# Patient Record
Sex: Female | Born: 1945 | Race: Black or African American | Hispanic: No | Marital: Married | Smoking: Former smoker
Health system: Southern US, Community
[De-identification: ages and names within clinical notes are randomized; demographics above are authoritative.]

## PROBLEM LIST (undated history)

## (undated) DIAGNOSIS — M545 Low back pain, unspecified: Secondary | ICD-10-CM

## (undated) DIAGNOSIS — Z9889 Other specified postprocedural states: Secondary | ICD-10-CM

## (undated) DIAGNOSIS — J45909 Unspecified asthma, uncomplicated: Secondary | ICD-10-CM

## (undated) DIAGNOSIS — E559 Vitamin D deficiency, unspecified: Secondary | ICD-10-CM

## (undated) DIAGNOSIS — E119 Type 2 diabetes mellitus without complications: Secondary | ICD-10-CM

## (undated) DIAGNOSIS — R51 Headache: Secondary | ICD-10-CM

## (undated) DIAGNOSIS — Z862 Personal history of diseases of the blood and blood-forming organs and certain disorders involving the immune mechanism: Secondary | ICD-10-CM

## (undated) DIAGNOSIS — I700 Atherosclerosis of aorta: Secondary | ICD-10-CM

## (undated) DIAGNOSIS — R351 Nocturia: Secondary | ICD-10-CM

## (undated) DIAGNOSIS — Z972 Presence of dental prosthetic device (complete) (partial): Secondary | ICD-10-CM

## (undated) DIAGNOSIS — R0600 Dyspnea, unspecified: Secondary | ICD-10-CM

## (undated) DIAGNOSIS — K219 Gastro-esophageal reflux disease without esophagitis: Secondary | ICD-10-CM

## (undated) DIAGNOSIS — E785 Hyperlipidemia, unspecified: Secondary | ICD-10-CM

## (undated) DIAGNOSIS — J302 Other seasonal allergic rhinitis: Secondary | ICD-10-CM

## (undated) DIAGNOSIS — Z8619 Personal history of other infectious and parasitic diseases: Secondary | ICD-10-CM

## (undated) DIAGNOSIS — Z9689 Presence of other specified functional implants: Secondary | ICD-10-CM

## (undated) DIAGNOSIS — Z8669 Personal history of other diseases of the nervous system and sense organs: Secondary | ICD-10-CM

## (undated) DIAGNOSIS — Z860100 Personal history of colon polyps, unspecified: Secondary | ICD-10-CM

## (undated) DIAGNOSIS — N189 Chronic kidney disease, unspecified: Secondary | ICD-10-CM

## (undated) DIAGNOSIS — M1990 Unspecified osteoarthritis, unspecified site: Secondary | ICD-10-CM

## (undated) DIAGNOSIS — H269 Unspecified cataract: Secondary | ICD-10-CM

## (undated) DIAGNOSIS — R531 Weakness: Secondary | ICD-10-CM

## (undated) DIAGNOSIS — F419 Anxiety disorder, unspecified: Secondary | ICD-10-CM

## (undated) DIAGNOSIS — I10 Essential (primary) hypertension: Secondary | ICD-10-CM

## (undated) DIAGNOSIS — R519 Headache, unspecified: Secondary | ICD-10-CM

## (undated) HISTORY — DX: Essential (primary) hypertension: I10

## (undated) HISTORY — PX: REVISION OF SCAR TISSUE RECTUS MUSCLE: SHX2351

## (undated) HISTORY — PX: BACK SURGERY: SHX140

## (undated) HISTORY — PX: OTHER SURGICAL HISTORY: SHX169

## (undated) HISTORY — PX: ABDOMINAL HYSTERECTOMY: SHX81

## (undated) HISTORY — DX: Type 2 diabetes mellitus without complications: E11.9

## (undated) HISTORY — PX: TOTAL KNEE ARTHROPLASTY: SHX125

## (undated) HISTORY — DX: Atherosclerosis of aorta: I70.0

## (undated) HISTORY — DX: Unspecified asthma, uncomplicated: J45.909

## (undated) HISTORY — PX: APPENDECTOMY: SHX54

## (undated) HISTORY — PX: COLONOSCOPY: SHX174

## (undated) HISTORY — PX: JOINT REPLACEMENT: SHX530

## (undated) HISTORY — PX: SMALL BOWEL REPAIR: SHX6447

## (undated) HISTORY — PX: ESOPHAGOGASTRODUODENOSCOPY: SHX1529

---

## 1968-08-22 HISTORY — PX: BREAST EXCISIONAL BIOPSY: SUR124

## 1976-08-22 HISTORY — PX: AUGMENTATION MAMMAPLASTY: SUR837

## 1988-08-22 HISTORY — PX: BREAST SURGERY: SHX581

## 1996-08-22 HISTORY — PX: KIDNEY SURGERY: SHX687

## 2000-06-28 ENCOUNTER — Encounter: Payer: Self-pay | Admitting: Neurosurgery

## 2000-06-28 ENCOUNTER — Ambulatory Visit (HOSPITAL_COMMUNITY): Admission: RE | Admit: 2000-06-28 | Discharge: 2000-06-28 | Payer: Self-pay | Admitting: Neurosurgery

## 2001-08-31 ENCOUNTER — Encounter: Payer: Self-pay | Admitting: Neurosurgery

## 2001-08-31 ENCOUNTER — Encounter: Admission: RE | Admit: 2001-08-31 | Discharge: 2001-08-31 | Payer: Self-pay | Admitting: Neurosurgery

## 2001-09-26 ENCOUNTER — Encounter: Payer: Self-pay | Admitting: Neurosurgery

## 2001-09-26 ENCOUNTER — Inpatient Hospital Stay (HOSPITAL_COMMUNITY): Admission: RE | Admit: 2001-09-26 | Discharge: 2001-09-28 | Payer: Self-pay | Admitting: Neurosurgery

## 2002-01-09 ENCOUNTER — Encounter: Payer: Self-pay | Admitting: Neurosurgery

## 2002-01-09 ENCOUNTER — Encounter: Admission: RE | Admit: 2002-01-09 | Discharge: 2002-01-09 | Payer: Self-pay | Admitting: Neurosurgery

## 2002-09-04 ENCOUNTER — Emergency Department (HOSPITAL_COMMUNITY): Admission: EM | Admit: 2002-09-04 | Discharge: 2002-09-04 | Payer: Self-pay

## 2003-08-19 ENCOUNTER — Inpatient Hospital Stay (HOSPITAL_COMMUNITY): Admission: RE | Admit: 2003-08-19 | Discharge: 2003-08-23 | Payer: Self-pay | Admitting: Neurosurgery

## 2004-01-06 ENCOUNTER — Encounter: Admission: RE | Admit: 2004-01-06 | Discharge: 2004-01-06 | Payer: Self-pay | Admitting: Neurosurgery

## 2004-03-10 ENCOUNTER — Encounter: Admission: RE | Admit: 2004-03-10 | Discharge: 2004-03-10 | Payer: Self-pay | Admitting: Neurosurgery

## 2004-03-23 ENCOUNTER — Encounter: Admission: RE | Admit: 2004-03-23 | Discharge: 2004-03-23 | Payer: Self-pay | Admitting: Neurosurgery

## 2004-07-29 ENCOUNTER — Ambulatory Visit: Payer: Self-pay | Admitting: Internal Medicine

## 2004-08-19 ENCOUNTER — Ambulatory Visit: Payer: Self-pay | Admitting: Internal Medicine

## 2004-09-13 ENCOUNTER — Ambulatory Visit: Payer: Self-pay | Admitting: Neurology

## 2004-10-03 ENCOUNTER — Emergency Department: Payer: Self-pay | Admitting: Emergency Medicine

## 2005-03-28 ENCOUNTER — Ambulatory Visit: Payer: Self-pay | Admitting: Pain Medicine

## 2005-05-09 ENCOUNTER — Ambulatory Visit: Payer: Self-pay | Admitting: Pain Medicine

## 2005-05-24 ENCOUNTER — Ambulatory Visit: Payer: Self-pay | Admitting: Internal Medicine

## 2005-05-31 ENCOUNTER — Ambulatory Visit: Payer: Self-pay | Admitting: Pain Medicine

## 2005-06-02 ENCOUNTER — Ambulatory Visit: Payer: Self-pay | Admitting: Pain Medicine

## 2005-06-08 ENCOUNTER — Ambulatory Visit: Payer: Self-pay | Admitting: Pain Medicine

## 2005-09-29 ENCOUNTER — Encounter: Admission: RE | Admit: 2005-09-29 | Discharge: 2005-09-29 | Payer: Self-pay | Admitting: Neurosurgery

## 2005-10-24 ENCOUNTER — Ambulatory Visit: Payer: Self-pay | Admitting: Internal Medicine

## 2005-11-15 ENCOUNTER — Emergency Department: Payer: Self-pay | Admitting: Emergency Medicine

## 2006-01-19 ENCOUNTER — Ambulatory Visit: Payer: Self-pay | Admitting: Specialist

## 2006-01-25 ENCOUNTER — Ambulatory Visit: Payer: Self-pay | Admitting: Unknown Physician Specialty

## 2006-02-10 ENCOUNTER — Ambulatory Visit: Payer: Self-pay | Admitting: Specialist

## 2006-04-26 ENCOUNTER — Ambulatory Visit: Payer: Self-pay | Admitting: Internal Medicine

## 2006-06-05 ENCOUNTER — Ambulatory Visit: Payer: Self-pay | Admitting: Pain Medicine

## 2006-07-10 ENCOUNTER — Ambulatory Visit: Payer: Self-pay | Admitting: Pain Medicine

## 2006-09-12 ENCOUNTER — Ambulatory Visit: Payer: Self-pay | Admitting: Pain Medicine

## 2006-09-18 ENCOUNTER — Ambulatory Visit: Payer: Self-pay | Admitting: Pain Medicine

## 2006-11-06 ENCOUNTER — Ambulatory Visit: Payer: Self-pay | Admitting: Internal Medicine

## 2006-11-07 ENCOUNTER — Ambulatory Visit: Payer: Self-pay | Admitting: Specialist

## 2007-06-06 ENCOUNTER — Ambulatory Visit: Payer: Self-pay | Admitting: Pain Medicine

## 2007-06-21 ENCOUNTER — Emergency Department: Payer: Self-pay | Admitting: Internal Medicine

## 2007-09-04 ENCOUNTER — Encounter: Admission: RE | Admit: 2007-09-04 | Discharge: 2007-09-04 | Payer: Self-pay | Admitting: Orthopedic Surgery

## 2007-09-23 ENCOUNTER — Ambulatory Visit: Payer: Self-pay | Admitting: Emergency Medicine

## 2007-11-26 ENCOUNTER — Ambulatory Visit: Payer: Self-pay | Admitting: Gastroenterology

## 2007-12-11 ENCOUNTER — Ambulatory Visit: Payer: Self-pay | Admitting: Internal Medicine

## 2008-01-10 ENCOUNTER — Observation Stay (HOSPITAL_COMMUNITY): Admission: RE | Admit: 2008-01-10 | Discharge: 2008-01-11 | Payer: Self-pay | Admitting: Orthopedic Surgery

## 2008-04-10 ENCOUNTER — Ambulatory Visit: Payer: Self-pay | Admitting: Internal Medicine

## 2008-06-18 ENCOUNTER — Ambulatory Visit: Payer: Self-pay | Admitting: Internal Medicine

## 2009-01-12 ENCOUNTER — Ambulatory Visit: Payer: Self-pay | Admitting: Gastroenterology

## 2009-02-03 ENCOUNTER — Ambulatory Visit: Payer: Self-pay | Admitting: Family Medicine

## 2009-06-24 ENCOUNTER — Inpatient Hospital Stay: Payer: Self-pay | Admitting: Internal Medicine

## 2009-09-08 ENCOUNTER — Inpatient Hospital Stay (HOSPITAL_COMMUNITY): Admission: RE | Admit: 2009-09-08 | Discharge: 2009-09-11 | Payer: Self-pay | Admitting: Specialist

## 2009-09-11 ENCOUNTER — Encounter: Payer: Self-pay | Admitting: Internal Medicine

## 2009-09-21 ENCOUNTER — Ambulatory Visit: Payer: Self-pay | Admitting: Internal Medicine

## 2009-09-22 ENCOUNTER — Encounter: Payer: Self-pay | Admitting: Internal Medicine

## 2009-09-23 ENCOUNTER — Emergency Department (HOSPITAL_COMMUNITY): Admission: EM | Admit: 2009-09-23 | Discharge: 2009-09-23 | Payer: Self-pay | Admitting: Emergency Medicine

## 2009-10-20 ENCOUNTER — Ambulatory Visit: Payer: Self-pay | Admitting: Family Medicine

## 2010-04-12 ENCOUNTER — Ambulatory Visit: Payer: Self-pay | Admitting: Family Medicine

## 2010-05-18 ENCOUNTER — Ambulatory Visit: Payer: Self-pay | Admitting: Family Medicine

## 2010-09-13 ENCOUNTER — Encounter: Payer: Self-pay | Admitting: Physical Medicine and Rehabilitation

## 2010-09-26 ENCOUNTER — Ambulatory Visit: Payer: Self-pay | Admitting: Family Medicine

## 2010-10-05 ENCOUNTER — Other Ambulatory Visit: Payer: Self-pay | Admitting: Physical Medicine and Rehabilitation

## 2010-10-05 ENCOUNTER — Ambulatory Visit
Admission: RE | Admit: 2010-10-05 | Discharge: 2010-10-05 | Disposition: A | Discharge status: Home / Self Care | Payer: Private Health Insurance - Indemnity | Source: Ambulatory Visit | Attending: Physical Medicine and Rehabilitation | Admitting: Physical Medicine and Rehabilitation

## 2010-10-05 DIAGNOSIS — M25512 Pain in left shoulder: Secondary | ICD-10-CM

## 2010-10-21 HISTORY — PX: CARDIAC CATHETERIZATION: SHX172

## 2010-11-07 LAB — CBC
HCT: 41.9 % (ref 36.0–46.0)
Hemoglobin: 14 g/dL (ref 12.0–15.0)
MCHC: 33.3 g/dL (ref 30.0–36.0)
MCV: 81.3 fL (ref 78.0–100.0)
Platelets: 329 10*3/uL (ref 150–400)
RBC: 5.16 MIL/uL — ABNORMAL HIGH (ref 3.87–5.11)
RDW: 14.2 % (ref 11.5–15.5)
WBC: 6.7 10*3/uL (ref 4.0–10.5)

## 2010-11-07 LAB — COMPREHENSIVE METABOLIC PANEL
ALT: 18 U/L (ref 0–35)
AST: 20 U/L (ref 0–37)
Albumin: 4 g/dL (ref 3.5–5.2)
Alkaline Phosphatase: 104 U/L (ref 39–117)
BUN: 17 mg/dL (ref 6–23)
CO2: 30 mEq/L (ref 19–32)
Calcium: 9.8 mg/dL (ref 8.4–10.5)
Chloride: 102 mEq/L (ref 96–112)
Creatinine, Ser: 0.64 mg/dL (ref 0.4–1.2)
GFR calc Af Amer: >60 mL/min (ref >60)
GFR calc non Af Amer: >60 mL/min (ref >60)
Glucose, Bld: 162 mg/dL — ABNORMAL HIGH (ref 70–99)
Potassium: 3.7 mEq/L (ref 3.5–5.1)
Sodium: 142 mEq/L (ref 135–145)
Total Bilirubin: 0.3 mg/dL (ref 0.3–1.2)
Total Protein: 8 g/dL (ref 6.0–8.3)

## 2010-11-07 LAB — DIFFERENTIAL
Basophils Absolute: 0 10*3/uL (ref 0.0–0.1)
Basophils Relative: 0 % (ref 0–1)
Eosinophils Absolute: 0.1 10*3/uL (ref 0.0–0.7)
Eosinophils Relative: 1 % (ref 0–5)
Lymphocytes Relative: 5 % — ABNORMAL LOW (ref 12–46)
Lymphs Abs: 0.4 10*3/uL — ABNORMAL LOW (ref 0.7–4.0)
Monocytes Absolute: 0.2 10*3/uL (ref 0.1–1.0)
Monocytes Relative: 2 % — ABNORMAL LOW (ref 3–12)
Neutro Abs: 6.1 10*3/uL (ref 1.7–7.7)
Neutrophils Relative %: 91 % — ABNORMAL HIGH (ref 43–77)

## 2010-11-07 LAB — URINALYSIS, ROUTINE W REFLEX MICROSCOPIC
Bilirubin Urine: NEGATIVE
Glucose, UA: NEGATIVE mg/dL
Hgb urine dipstick: NEGATIVE
Ketones, ur: NEGATIVE mg/dL
Nitrite: NEGATIVE
Protein, ur: NEGATIVE mg/dL
Specific Gravity, Urine: 1.015 (ref 1.005–1.030)
Urobilinogen, UA: 1 mg/dL (ref 0.0–1.0)
pH: 6 (ref 5.0–8.0)

## 2010-11-07 LAB — PROTIME-INR
INR: 0.93 (ref 0.00–1.49)
Prothrombin Time: 12.4 seconds (ref 11.6–15.2)

## 2010-11-07 LAB — APTT: aPTT: 32 seconds (ref 24–37)

## 2010-11-08 LAB — BASIC METABOLIC PANEL
BUN: 10 mg/dL (ref 6–23)
BUN: 5 mg/dL — ABNORMAL LOW (ref 6–23)
CO2: 24 mEq/L (ref 19–32)
CO2: 26 mEq/L (ref 19–32)
Calcium: 8.6 mg/dL (ref 8.4–10.5)
Calcium: 8.6 mg/dL (ref 8.4–10.5)
Chloride: 106 mEq/L (ref 96–112)
Chloride: 107 mEq/L (ref 96–112)
Creatinine, Ser: 0.61 mg/dL (ref 0.4–1.2)
Creatinine, Ser: 0.62 mg/dL (ref 0.4–1.2)
GFR calc Af Amer: >60 mL/min (ref >60)
GFR calc Af Amer: >60 mL/min (ref >60)
GFR calc non Af Amer: >60 mL/min (ref >60)
GFR calc non Af Amer: >60 mL/min (ref >60)
Glucose, Bld: 136 mg/dL — ABNORMAL HIGH (ref 70–99)
Glucose, Bld: 152 mg/dL — ABNORMAL HIGH (ref 70–99)
Potassium: 3.4 mEq/L — ABNORMAL LOW (ref 3.5–5.1)
Potassium: 4.2 mEq/L (ref 3.5–5.1)
Sodium: 137 mEq/L (ref 135–145)
Sodium: 138 mEq/L (ref 135–145)

## 2010-11-08 LAB — CROSSMATCH
ABO/RH(D): O POS
Antibody Screen: NEGATIVE

## 2010-11-08 LAB — CBC
HCT: 26.6 % — ABNORMAL LOW (ref 36.0–46.0)
HCT: 27.2 % — ABNORMAL LOW (ref 36.0–46.0)
HCT: 35.2 % — ABNORMAL LOW (ref 36.0–46.0)
Hemoglobin: 11.4 g/dL — ABNORMAL LOW (ref 12.0–15.0)
Hemoglobin: 8.8 g/dL — ABNORMAL LOW (ref 12.0–15.0)
Hemoglobin: 9 g/dL — ABNORMAL LOW (ref 12.0–15.0)
MCHC: 32.4 g/dL (ref 30.0–36.0)
MCHC: 33.2 g/dL (ref 30.0–36.0)
MCHC: 33.3 g/dL (ref 30.0–36.0)
MCV: 81.6 fL (ref 78.0–100.0)
MCV: 82.2 fL (ref 78.0–100.0)
MCV: 82.7 fL (ref 78.0–100.0)
Platelets: 225 10*3/uL (ref 150–400)
Platelets: 258 10*3/uL (ref 150–400)
Platelets: 268 10*3/uL (ref 150–400)
RBC: 3.23 MIL/uL — ABNORMAL LOW (ref 3.87–5.11)
RBC: 3.33 MIL/uL — ABNORMAL LOW (ref 3.87–5.11)
RBC: 4.26 MIL/uL (ref 3.87–5.11)
RDW: 13.7 % (ref 11.5–15.5)
RDW: 14 % (ref 11.5–15.5)
RDW: 14.1 % (ref 11.5–15.5)
WBC: 7.1 10*3/uL (ref 4.0–10.5)
WBC: 7.2 10*3/uL (ref 4.0–10.5)
WBC: 8.5 10*3/uL (ref 4.0–10.5)

## 2010-11-08 LAB — GLUCOSE, CAPILLARY: Glucose-Capillary: 150 mg/dL — ABNORMAL HIGH (ref 70–99)

## 2010-11-08 LAB — ABO/RH: ABO/RH(D): O POS

## 2010-11-09 ENCOUNTER — Observation Stay: Payer: Self-pay | Admitting: *Deleted

## 2010-11-11 LAB — BASIC METABOLIC PANEL
BUN: 16 mg/dL (ref 6–23)
CO2: 25 mEq/L (ref 19–32)
Calcium: 9.6 mg/dL (ref 8.4–10.5)
Chloride: 102 mEq/L (ref 96–112)
Creatinine, Ser: 0.58 mg/dL (ref 0.4–1.2)
GFR calc Af Amer: >60 mL/min (ref >60)
GFR calc non Af Amer: >60 mL/min (ref >60)
Glucose, Bld: 145 mg/dL — ABNORMAL HIGH (ref 70–99)
Potassium: 3.9 mEq/L (ref 3.5–5.1)
Sodium: 136 mEq/L (ref 135–145)

## 2010-11-11 LAB — CBC
HCT: 30.1 % — ABNORMAL LOW (ref 36.0–46.0)
Hemoglobin: 9.9 g/dL — ABNORMAL LOW (ref 12.0–15.0)
MCHC: 32.9 g/dL (ref 30.0–36.0)
MCV: 82.1 fL (ref 78.0–100.0)
Platelets: 565 10*3/uL — ABNORMAL HIGH (ref 150–400)
RBC: 3.67 MIL/uL — ABNORMAL LOW (ref 3.87–5.11)
RDW: 14.9 % (ref 11.5–15.5)
WBC: 6.7 10*3/uL (ref 4.0–10.5)

## 2010-11-11 LAB — D-DIMER, QUANTITATIVE (NOT AT ARMC): D-Dimer, Quant: 6.77 ug/mL-FEU — ABNORMAL HIGH (ref 0.00–0.48)

## 2010-11-29 ENCOUNTER — Ambulatory Visit: Payer: Self-pay | Admitting: Internal Medicine

## 2010-12-20 ENCOUNTER — Ambulatory Visit: Payer: Self-pay | Admitting: Internal Medicine

## 2010-12-21 HISTORY — PX: CARDIAC CATHETERIZATION: SHX172

## 2010-12-23 ENCOUNTER — Other Ambulatory Visit: Payer: Self-pay | Admitting: Physical Medicine and Rehabilitation

## 2010-12-23 DIAGNOSIS — M25512 Pain in left shoulder: Secondary | ICD-10-CM

## 2011-01-04 NOTE — Op Note (Signed)
NAMEALYDA, MEGNA              ACCOUNT NO.:  [id number]   MEDICAL RECORD NO.:  [id number]          PATIENT TYPE:  OIB   LOCATION:  5014                         FACILITY:  [facility]   PHYSICIAN:  Alvy Beal, MD    DATE OF BIRTH:  1946-08-17   DATE OF PROCEDURE:  01/10/2008  DATE OF DISCHARGE:                               OPERATIVE REPORT   PREOPERATIVE DIAGNOSES:  1. Chronic lower extremity.  2. Back pain.   POSTOPERATIVE DIAGNOSIS:  1. Chronic lower extremity.  2. Back pain (failed back syndrome).   OPERATIVE PROCEDURE:  Permanent spinal cord stimulator placement.   COMPLICATIONS:  None.   CONDITION:  Stable.   FIRST ASSISTANT:  Crissie Reese, PA   HISTORY:  This is a very pleasant 65 year old woman who has undergone  several lumbar surgeries ultimately leading to L4-1 fusion.  The patient  still has significant back and lower extremity pain.  She had a trial  spinal cord stimulator lead for 5 days and she did very well with this.  Because of the positive improvement both in pain and function, she  elected to have the permanent lead placed.  All appropriate risks,  benefits, and alternatives were discussed with the patient and consent  was obtained.   OPERATIVE NOTE:  The patient was brought to the operating room and  placed supine on the operating table.  After successful induction of  general anesthesia and endotracheal intubation, TEDs and SCDs were  applied.  She was turned prone onto a Wilson frame.  All bony  prominences were well-padded and the back was prepped and draped in  standard fashion.  It should be noted that preoperatively in the holding  area the patient stood up for appropriate battery lead in incisional  site.  At this point, a midline thoracic incision was made.  I then used  fluoroscopy and needles to count from the L5 level up to T10.  Once I  identified the T10 vertebral body, I then made an incision centered at  the T10-T11 space.  Sharp  dissection was carried out down to and through  the deep fascia and I exposed the T10 and T11 spinous process and  lamina.  I then rechecked to ensure I was at the appropriate level of  T10.  Once confirmed, I then performed a laminotomy using a Leksell  rongeur and then fine curettes and 2-3 mm Kerrison.  Once the laminotomy  of T10 was performed, I then resected the ligamentum flavum to expose  the underlying thecal sac.  Once exposed, I then took a small dural  spatula and set it along the dorsum of the thecal sac in a cephalad  position.  When this passed without significant tension or resistance, I  then took the spinal cord stimulator permanent implant and advanced it  to the appropriate height.  I advanced it so that it would span all of  T9 and most of T10 just at the trial head.  At this point, I then took  an x-ray confirming it was midline and was properly positioned.  I then  took the leads and wrapped them and made a hole in the T9-10 interspace  and then passed the leads through this to crisscross them, so that they  would secure in position.  I then took FiberWire and then sutured the  leads directly to the remaining portion of the T10 spinous process.  At  this point, the leads were firm and secure and they were not mobile.  With the lead secure, I then made an incision on the right gluteal  region right where it measured and then passed submuscular the leads to  the battery site.  With the leads properly positioned, we then connected  to the battery and ran a test.  The leads were functioning properly and  so I locked it to the battery and then made a pocket that was 2.5 cm in  depth and placed the battery in the pocket and went on the remaining  leads underneath the battery.  I then secured to the deep fascia with 2-  0 Ethibond.  I irrigated all the wounds and then closed the deep fascia  of the thoracic site with interrupted #1 Vicryl sutures and then closed  the dermis  at both levels with interrupted 2-0 Vicryl sutures and 3-0  Monocryl for the skin incisions.  Steri-Strips, dry dressing were  applied.  The patient was extubated and transferred to the PACU without  incident.  At the end of the case, all needles and sponge counts were  correct.      Alvy Beal, MD  Electronically Signed     DDB/MEDQ  D:  01/10/2008  T:  01/11/2008  Job:  [id number]

## 2011-01-07 NOTE — Op Note (Signed)
Lori Potts, Lori Potts                          ACCOUNT NO.:  [id number]   MEDICAL RECORD NO.:  [id number]                   PATIENT TYPE:  INP   LOCATION:  2899                                 FACILITY:  [facility]   PHYSICIAN:  Lori Potts, M.D.                 DATE OF BIRTH:  May 24, 1946   DATE OF PROCEDURE:  08/19/2003  DATE OF DISCHARGE:                                 OPERATIVE REPORT   PREOPERATIVE DIAGNOSES:  1. 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  2. Status post L5-S1 posterior lumbar fusion.   POSTOPERATIVE DIAGNOSES:  1. 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  2. Status post L5-S1 posterior lumbar fusion.   OPERATION PERFORMED:  L4-5 Gill procedure with foraminotomies.  L4-5  posterior lumbar interbody fusion utilizing tangent wedges and local  autografting.  L4-5 posterolateral fusion with pedicle screw instrumentation  and local autografting.  Re-exploration of L5-S1 fusion.  Removal of L5-S1  instrumentation.   SURGEON:  Lori Potts, M.D.   ASSISTANT:  Lori Alert, MD   ANESTHESIA:  General endotracheal.   INDICATIONS FOR PROCEDURE:  Lori Potts is a 65 year old female status  post previous L5-S1 decompression and fusion with instrumentation.  Postoperatively she had done well.  She developed late onset of back and  lower extremity pain failing conservative management.  Recent work-up has  demonstrated evidence of unstable degenerative spondylolisthesis at L4-5.  Patient presents now for decompression and fusion.  This will require re-  exploration of her previous fusion at L5-S1 which currently appears solid.   DESCRIPTION OF PROCEDURE:  The patient was taken to the operating room and  placed on the table in the supine position.  After adequate level of  anesthesia was achieved, the patient was positioned prone onto a Wilson  frame and appropriately padded.  The patient's lumbar region was prepped and  draped  sterilely.  A 10 blade was used to make a linear skin incision  overlying the L4,5 and S1 levels.  This was carried down sharply in the  midline.  A subperiosteal dissection was then performed exposing the lamina  and facet joints of L4, L5 and the sacrum.  The patient's previous pedicle  screw instrumentation at L5-S1 was dissected free.  Transverse processes of  L4 were dissected free.  Deep self-retaining retractor was placed.  Fusion  was then inspected at L5-S1 and found to be solid.  Locking caps and the rod  were removed bilaterally.  The lamina of L4 was dissected free.  A complete  laminectomy of L4 including a complete inferior facetectomy at L4  bilaterally was performed as well as superior facetectomies at L5 were  performed.  All bone was cleaned and used in later autografting.  Complete  foraminotomies along the course of exiting L4 nerve root were performed  completing the Gill procedure.  Epidural venous plexus coagulated and cut.  Pedicles  at L4 were identified using surface landmarks and intraoperative  fluoroscopy.  Superficial bone overlying the pedicle was removed using high  speed drill.  Each pedicle was then probed using pedicle awl.  Each pedicle  awl tract was then probed and found to be solidly within bone.  Each pedicle  awl tract was then tapped with a 5.25 mm screw tap.  Each screw tap hole was  found to be solidly within bone.  6.75 x 45 mm Spiral 90D screws were placed  bilaterally.  Attention was placed at the interspace.  Starting first on the  patient's left side, the thecal sac and nerve roots were protected.  The  disk space was incised with a 15 blade in rectangular fashion.  A wide disk  space cleanout was then achieved pituitary rongeurs and upward angled  pituitary rongeurs and Epstein curets.  The procedure was then repeated on a  contralateral side.  The disk space was then sequentially dilated up to 10  mm with a 10 mm distractor left in the  patient's right side.  The thecal sac  and nerve roots were once again dissected on the left.  The disk space was  then reamed and then cut with 10 mm tangent instrument.  Soft tissue was  removed from the interspace.  A 10 x 26 mm tangent wedge was then impacted  into place and recessed approximately 2 mm posterior to the cortical margin.  Distractor was removed from the patient's right side.  The thecal sac and  nerve root were then protected on the right side.  Once again, the disk  space was then reamed and then cut.  Soft tissue was removed from the  interspace. The disk space was further curettaged.  Morselized autograft was  packed into the interspace.  A second 10 x 26 mm tangent wedge was then  packed in place and recessed approximately 2 mm from the posterior cortical  margin. The transverse processes of L4 and L5 were then decorticated using a  high speed drill.  Morselized autograft was packed posterolaterally.  A  segment of titanium rod was then contoured and placed through the screw  heads at L5, L5 and S1.  This was then attached using locking caps.  The  locking caps were then engaged with the construct under compression.  Final  images revealed good position of bone graft and hardware at the proper  operative level with normal alignment of the spine.  The wound was then  irrigated one final time.  Gelfoam was placed topically for hemostasis.  A  medium Hemovac drain was left in the epidural space.  The wound was then  closed in layers with Vicryl sutures.  Steri-Strips and sterile dressing  were applied.  There were no apparent complications.  The patient tolerated  the procedure well and returned to the recovery room postoperatively.                                               Lori Potts, M.D.    HAP/MEDQ  D:  08/19/2003  T:  08/19/2003  Job:  [id number]

## 2011-01-07 NOTE — Discharge Summary (Signed)
[name], Lori Potts                          ACCOUNT NO.:  [id number]   MEDICAL RECORD NO.:  [id number]                   PATIENT TYPE:   LOCATION:                                       FACILITY:  [facility]   PHYSICIAN:  Henry A. Pool, M.D.                 DATE OF BIRTH:   DATE OF ADMISSION:  08/19/2003  DATE OF DISCHARGE:  08/23/2003                                 DISCHARGE SUMMARY   SERVICE:  Neurosurgery.   FINAL DIAGNOSIS:  L4-5 degenerative spondylolisthesis with stenosis.   HISTORY OF PRESENT ILLNESS:  Lori Potts is a 65 year old female who is  status post a previous L5-S1 decompression and fusion.  She presents now  with worsening back and lower extremity pain.  Workup has demonstrated  evidence of breakdown of the L4-5 disk space with resultant  spondylolisthesis.  She presents now for decompression and fusion at the L4-  5 level.   HOSPITAL COURSE:  The patient was taken to the operating room where an  uncomplicated L4-5 decompression and fusion were performed.  Postoperatively, the patient awakened with good improvement of her overall  pain.  Lower extremity pain was completely resolved.  Strength and sensation  were intact.  She was gradually mobilized.  On her third postoperative day,  she was ready for discharge home.   CONDITION ON DISCHARGE:  Condition on discharge is improved.   FOLLOWUP:  Discharge followup is in 1 week in my office.                                                Henry A. Pool, M.D.    HAP/MEDQ  D:  12/24/2003  T:  12/25/2003  Job:  [id number]

## 2011-01-07 NOTE — Op Note (Signed)
[facility]  Patient:    GENNETT, GARCIA Visit Number: [id number] MRN: [id number]          Service Type: SUR Location: [id number] Attending Physician:  Donn Pierini Dictated by:   Julio Sicks, M.D. Proc. Date: 09/26/01 Admit Date:  09/26/2001                             Operative Report  PREOPERATIVE DIAGNOSIS:  Right L5-S1 herniated nucleus pulposus with degenerative disk disease and chronic back and lower extremity pain.  POSTOPERATIVE DIAGNOSIS:  Right L5-S1 herniated nucleus pulposus with degenerative disk disease and chronic back and lower extremity pain.  PROCEDURES: 1. L5-S1 decompressive lumbar laminectomy with foraminotomies. 2. L5-S1 bilateral microdiskectomies. 3. L5-S1 posterior lumbar interbody fusion utilizing Tangent wedges and local    autograft. 4. L5-S1 posterolateral fusion utilizing pedicle screw instrumentation and    local autograft.  SURGEON:  Julio Sicks, M.D.  ASSISTANT:  Donalee Citrin, Montez Hageman., M.D.  ANESTHESIA:  General endotracheal.  INDICATION:  Ms. Nancarrow is a 65 year old female with a history of severe back and lower extremity pain failing all conservative management.  MRI scanning demonstrates degenerative disk disease within her lower lumbar spine. There is a rightward disk herniation without any significant nerve root compression appreciated on scanning.  The patient also underwent a myelogram and a CT scan, which showed similar findings.  She failed conservative management, including epidural steroid injections.  A lumbar discogram was done showing concordant pain response with injection to the L5 level.  This did demonstrate rather diffuse annular disruption, worse on the right side. L4-5 and L3-4 also showed degenerative changes but produced no concordant pain in response to injection.  The patient weighed the options available for management, including the possibility of undergoing L5-S1  decompression and fusion.  She has decided to proceed with surgery in hopes of improving her symptoms.  DESCRIPTION OF PROCEDURE:  The patient was taken to the operating room and placed on the operating table in supine position.  After an adequate level of anesthesia was achieved, the patient was positioned prone onto a Wilson frame, appropriately padded.  The patients lumbar region was prepped and draped sterilely.  A 10 blade was used to make a linear skin incision overlying the L4, L5, and S1 levels.  This was carried down sharply in the midline. Subperiosteal dissection was performed, exposing the laminae and facet joints of L4, L5, and S1.  A deep self-retaining retractor was placed.  The transverse processes of L5 and the sacral ala were also dissected free. Intraoperative fluoroscopy was used, and the L5-S1 level was confirmed.  A decompressive laminectomy was then performed at L5 and S1 using the Leksell rongeur, Kerrison rongeurs, and high-speed drill to remove the entire lamina of L5, the entire inferior facets of L5, and the superior facets of S1. Ligamentum flavum was then elevated and resected in piecemeal fashion using the Kerrison punch.  The underlying thecal sac and exiting L5 and S1 nerve roots were identified, and wide foraminotomies were performed.  Epidural venous plexus coagulated and cut.  Thecal sac was mobilized, starting first at the patients left side.  The disk space was readily identified and incised with a 15 blade in rectangular fashion.  Aggressive diskectomy was then performed using pituitary rongeurs, Epstein curettes, and upward-biting pituitary rongeurs.  After an adequate diskectomy was performed on that side, attention was paid to the patients right  side.  The thecal sac and nerve roots were once again mobilized and retracted toward the midline.  A large calcified disk herniation off to the right side between L5 and S1 was identified.  This was  removed using osteophyte removers and the disk space was incised with a 15 blade.  A wide disk space clean-out was then achieved using the pituitary rongeurs, upward-angled pituitary rongeurs, and Epstein curettes.  After a very thorough diskectomy was performed, the disk space was then sequentially distracted up to 11 mm with the distractor left in the patients right side.  The thecal sac and nerve roots were protected on the left.  The disk space was then reamed and cut with the 10 mm Tangent chisel. A 10 x 26 mm Tangent wedge was then impacted in place and recessed approximately 2 mm from the posterior cortical margin.  The procedure was then repeated on the patients contralateral side, again without complication. Prior to installation of the second Tangent wedge, morcellized autograft was packed into the interspace for later fusion.  A second wedge was placed, again without difficulty.  Final images revealed good position of the bone grafts at proper operative level, with normal alignment of the spine.  Pedicles of L5 and S1 were then isolated using surface landmarks and fluoroscopic guidance. Superficial bone was removed overlying the pedicles using the high-speed drill.  Each pedicle was then probed using the pedicle awl.  Each pedicle awl track was found to be solidly within bone.  Each pedicle awl track was then tapped with the 5.25 mm screw tap.  At L5, 5.75 x 45 mm Spiral 90 screws were placed bilaterally.  At S1, 6.75 x 35 mm Spiral 90 screws were placed bilaterally.  The transverse processes and sacral ala were then decorticated with the high-speed drill.  Morcellized autograft was packed posterolaterally for later fusion.  A short segment of titanium rod was then contoured and placed over the screw heads at L5 and S1.  The caps were placed over the screw heads.  Caps were then engaged at the inferior level.  The construct was compressed, and the superior caps were given a final  tightening.  Final images revealed good position of bone grafts and hardware, proper operative level, with normal alignment of the spine.  The spinal canal was inspected.  There  was no evidence of injury to thecal sac or nerve roots.  A blunt probe was passed easily along the course of the exiting L5 and S1 nerve roots.  The wound was then irrigated one final time.  A medium Hemovac drain was left in the epidural space.  Gelfoam was applied topically to the thecal sac for hemostasis, which was found to be good.  The wound was then closed in layers with Vicryl sutures.  Steri-Strips and sterile dressing were applied.  There were no apparent complications.  The patient tolerated the procedure well, and she returns to the recovery room postoperatively. Dictated by:   Julio Sicks, M.D. Attending Physician:  Donn Pierini DD:  09/26/01 TD:  09/27/01 Job: [id number]

## 2011-02-10 ENCOUNTER — Ambulatory Visit: Payer: Self-pay | Admitting: Family Medicine

## 2011-03-24 ENCOUNTER — Ambulatory Visit: Payer: Self-pay | Admitting: Family Medicine

## 2011-05-18 LAB — CBC
HCT: 36.4
Hemoglobin: 12
MCHC: 32.9
MCV: 80.5
Platelets: 336
RBC: 4.52
RDW: 14.1
WBC: 5.6

## 2011-07-05 ENCOUNTER — Ambulatory Visit: Payer: Self-pay | Admitting: Family Medicine

## 2011-08-04 ENCOUNTER — Ambulatory Visit: Payer: Self-pay | Admitting: Family Medicine

## 2011-09-11 ENCOUNTER — Ambulatory Visit: Payer: Self-pay

## 2011-09-11 LAB — RAPID INFLUENZA A&B ANTIGENS

## 2011-10-02 ENCOUNTER — Ambulatory Visit: Payer: Self-pay

## 2011-12-05 ENCOUNTER — Other Ambulatory Visit: Payer: Self-pay | Admitting: Orthopedic Surgery

## 2011-12-05 DIAGNOSIS — M961 Postlaminectomy syndrome, not elsewhere classified: Secondary | ICD-10-CM

## 2011-12-08 ENCOUNTER — Ambulatory Visit
Admission: RE | Admit: 2011-12-08 | Discharge: 2011-12-08 | Disposition: A | Discharge status: Home / Self Care | Payer: Private Health Insurance - Indemnity | Source: Ambulatory Visit | Attending: Orthopedic Surgery | Admitting: Orthopedic Surgery

## 2011-12-08 VITALS — BP 123/58 | HR 58

## 2011-12-08 DIAGNOSIS — M961 Postlaminectomy syndrome, not elsewhere classified: Secondary | ICD-10-CM

## 2011-12-08 MED ORDER — IOHEXOL 180 MG/ML  SOLN
15.0000 mL | Freq: Once | INTRAMUSCULAR | Status: AC | PRN
  Administered 2011-12-08: 15 mL via INTRATHECAL

## 2011-12-08 MED ORDER — DIPHENHYDRAMINE HCL 50 MG PO CAPS
50.0000 mg | ORAL_CAPSULE | Freq: Once | ORAL | Status: AC
  Administered 2011-12-08: 50 mg via ORAL

## 2011-12-08 MED ORDER — HYDROMORPHONE HCL PF 2 MG/ML IJ SOLN
1.5000 mg | Freq: Once | INTRAMUSCULAR | Status: AC
  Administered 2011-12-08: 1.5 mg via INTRAMUSCULAR

## 2011-12-08 MED ORDER — ONDANSETRON HCL 4 MG/2ML IJ SOLN
4.0000 mg | Freq: Once | INTRAMUSCULAR | Status: AC
  Administered 2011-12-08: 4 mg via INTRAMUSCULAR

## 2011-12-08 MED ORDER — DIAZEPAM 5 MG PO TABS
5.0000 mg | ORAL_TABLET | Freq: Once | ORAL | Status: AC
  Administered 2011-12-08: 5 mg via ORAL

## 2011-12-08 NOTE — Discharge Instructions (Signed)

## 2011-12-08 NOTE — Progress Notes (Signed)
Patient complaining of itching after total of Dilaudid 3mg  IM for pain.  Patient states this itching is not unusual for her to experience with pain medication.  Patient medicated with Benadryl 50mg  PO.  jkl

## 2012-05-14 ENCOUNTER — Ambulatory Visit: Payer: Self-pay | Admitting: Emergency Medicine

## 2012-08-07 ENCOUNTER — Ambulatory Visit: Payer: Self-pay | Admitting: Family Medicine

## 2012-10-15 ENCOUNTER — Encounter (HOSPITAL_COMMUNITY): Payer: Self-pay | Admitting: Pharmacy Technician

## 2012-10-15 DIAGNOSIS — T8484XA Pain due to internal orthopedic prosthetic devices, implants and grafts, initial encounter: Secondary | ICD-10-CM

## 2012-10-16 ENCOUNTER — Encounter (HOSPITAL_COMMUNITY): Payer: Self-pay | Admitting: *Deleted

## 2012-10-16 MED ORDER — CEFAZOLIN SODIUM-DEXTROSE 2-3 GM-% IV SOLR
2.0000 g | INTRAVENOUS | Status: AC
  Administered 2012-10-17: 2 g via INTRAVENOUS
  Filled 2012-10-16: qty 50

## 2012-10-17 ENCOUNTER — Encounter (HOSPITAL_COMMUNITY): Payer: Self-pay | Admitting: Certified Registered Nurse Anesthetist

## 2012-10-17 ENCOUNTER — Ambulatory Visit (HOSPITAL_COMMUNITY): Payer: Private Health Insurance - Indemnity

## 2012-10-17 ENCOUNTER — Ambulatory Visit (HOSPITAL_COMMUNITY): Payer: Private Health Insurance - Indemnity | Admitting: Certified Registered Nurse Anesthetist

## 2012-10-17 ENCOUNTER — Encounter (HOSPITAL_COMMUNITY)
Admission: RE | Disposition: A | Discharge status: Home / Self Care | Payer: Self-pay | Source: Ambulatory Visit | Attending: Orthopedic Surgery

## 2012-10-17 ENCOUNTER — Ambulatory Visit (HOSPITAL_COMMUNITY)
Admission: RE | Admit: 2012-10-17 | Discharge: 2012-10-17 | Disposition: A | Discharge status: Home / Self Care | Payer: Private Health Insurance - Indemnity | Source: Ambulatory Visit | Attending: Orthopedic Surgery | Admitting: Orthopedic Surgery

## 2012-10-17 DIAGNOSIS — Z01810 Encounter for preprocedural cardiovascular examination: Secondary | ICD-10-CM | POA: Insufficient documentation

## 2012-10-17 DIAGNOSIS — Z96659 Presence of unspecified artificial knee joint: Secondary | ICD-10-CM | POA: Insufficient documentation

## 2012-10-17 DIAGNOSIS — Z01812 Encounter for preprocedural laboratory examination: Secondary | ICD-10-CM | POA: Insufficient documentation

## 2012-10-17 DIAGNOSIS — I10 Essential (primary) hypertension: Secondary | ICD-10-CM | POA: Insufficient documentation

## 2012-10-17 DIAGNOSIS — T85890A Other specified complication of nervous system prosthetic devices, implants and grafts, initial encounter: Secondary | ICD-10-CM | POA: Insufficient documentation

## 2012-10-17 DIAGNOSIS — Z01818 Encounter for other preprocedural examination: Secondary | ICD-10-CM | POA: Insufficient documentation

## 2012-10-17 DIAGNOSIS — Y831 Surgical operation with implant of artificial internal device as the cause of abnormal reaction of the patient, or of later complication, without mention of misadventure at the time of the procedure: Secondary | ICD-10-CM | POA: Insufficient documentation

## 2012-10-17 DIAGNOSIS — Z79899 Other long term (current) drug therapy: Secondary | ICD-10-CM | POA: Insufficient documentation

## 2012-10-17 DIAGNOSIS — E119 Type 2 diabetes mellitus without complications: Secondary | ICD-10-CM | POA: Insufficient documentation

## 2012-10-17 DIAGNOSIS — K219 Gastro-esophageal reflux disease without esophagitis: Secondary | ICD-10-CM | POA: Insufficient documentation

## 2012-10-17 DIAGNOSIS — M129 Arthropathy, unspecified: Secondary | ICD-10-CM | POA: Insufficient documentation

## 2012-10-17 DIAGNOSIS — T8484XA Pain due to internal orthopedic prosthetic devices, implants and grafts, initial encounter: Secondary | ICD-10-CM

## 2012-10-17 DIAGNOSIS — M549 Dorsalgia, unspecified: Secondary | ICD-10-CM | POA: Insufficient documentation

## 2012-10-17 HISTORY — PX: SPINAL CORD STIMULATOR BATTERY EXCHANGE: SHX6202

## 2012-10-17 HISTORY — DX: Unspecified osteoarthritis, unspecified site: M19.90

## 2012-10-17 HISTORY — DX: Gastro-esophageal reflux disease without esophagitis: K21.9

## 2012-10-17 LAB — CBC
HCT: 34.1 % — ABNORMAL LOW (ref 36.0–46.0)
Hemoglobin: 11.7 g/dL — ABNORMAL LOW (ref 12.0–15.0)
MCH: 26.1 pg (ref 26.0–34.0)
MCHC: 34.3 g/dL (ref 30.0–36.0)
MCV: 75.9 fL — ABNORMAL LOW (ref 78.0–100.0)
Platelets: 296 10*3/uL (ref 150–400)
RBC: 4.49 MIL/uL (ref 3.87–5.11)
RDW: 14.3 % (ref 11.5–15.5)
WBC: 5.1 10*3/uL (ref 4.0–10.5)

## 2012-10-17 LAB — BASIC METABOLIC PANEL
BUN: 17 mg/dL (ref 6–23)
CO2: 28 mEq/L (ref 19–32)
Calcium: 10.2 mg/dL (ref 8.4–10.5)
Chloride: 99 mEq/L (ref 96–112)
Creatinine, Ser: 0.68 mg/dL (ref 0.50–1.10)
GFR calc Af Amer: >90 mL/min (ref >90)
GFR calc non Af Amer: 89 mL/min — ABNORMAL LOW (ref >90)
Glucose, Bld: 138 mg/dL — ABNORMAL HIGH (ref 70–99)
Potassium: 3.6 mEq/L (ref 3.5–5.1)
Sodium: 138 mEq/L (ref 135–145)

## 2012-10-17 LAB — GLUCOSE, CAPILLARY
Glucose-Capillary: 134 mg/dL — ABNORMAL HIGH (ref 70–99)
Glucose-Capillary: 124 mg/dL — ABNORMAL HIGH (ref 70–99)

## 2012-10-17 LAB — SURGICAL PCR SCREEN
MRSA, PCR: NEGATIVE
Staphylococcus aureus: NEGATIVE

## 2012-10-17 SURGERY — SPINAL CORD STIMULATOR BATTERY EXCHANGE
Anesthesia: General | Site: Back | Wound class: Clean

## 2012-10-17 MED ORDER — HYDROMORPHONE HCL PF 1 MG/ML IJ SOLN
0.2500 mg | INTRAMUSCULAR | Status: DC | PRN
  Administered 2012-10-17 (×4): 0.5 mg via INTRAVENOUS

## 2012-10-17 MED ORDER — MIDAZOLAM HCL 5 MG/5ML IJ SOLN
INTRAMUSCULAR | Status: DC | PRN
  Administered 2012-10-17: 2 mg via INTRAVENOUS

## 2012-10-17 MED ORDER — ONDANSETRON HCL 4 MG/2ML IJ SOLN
INTRAMUSCULAR | Status: DC | PRN
  Administered 2012-10-17: 4 mg via INTRAVENOUS

## 2012-10-17 MED ORDER — ACETAMINOPHEN 10 MG/ML IV SOLN
INTRAVENOUS | Status: AC
  Filled 2012-10-17: qty 100

## 2012-10-17 MED ORDER — BUPIVACAINE-EPINEPHRINE 0.25% -1:200000 IJ SOLN
INTRAMUSCULAR | Status: DC | PRN
  Administered 2012-10-17: 20 mL

## 2012-10-17 MED ORDER — LIDOCAINE HCL (CARDIAC) 20 MG/ML IV SOLN
INTRAVENOUS | Status: DC | PRN
  Administered 2012-10-17: 80 mg via INTRAVENOUS

## 2012-10-17 MED ORDER — SUCCINYLCHOLINE CHLORIDE 20 MG/ML IJ SOLN
INTRAMUSCULAR | Status: DC | PRN
  Administered 2012-10-17: 100 mg via INTRAVENOUS

## 2012-10-17 MED ORDER — MUPIROCIN 2 % EX OINT
TOPICAL_OINTMENT | CUTANEOUS | Status: AC
  Filled 2012-10-17: qty 22

## 2012-10-17 MED ORDER — METHOCARBAMOL 500 MG PO TABS: 500.0000 mg | ORAL_TABLET | Freq: Three times a day (TID) | ORAL | Status: DC | PRN

## 2012-10-17 MED ORDER — ONDANSETRON HCL 4 MG PO TABS: 4.0000 mg | ORAL_TABLET | Freq: Three times a day (TID) | ORAL | Status: DC | PRN

## 2012-10-17 MED ORDER — ARTIFICIAL TEARS OP OINT
TOPICAL_OINTMENT | OPHTHALMIC | Status: DC | PRN
  Administered 2012-10-17: 1 via OPHTHALMIC

## 2012-10-17 MED ORDER — POLYETHYLENE GLYCOL 3350 17 GM/SCOOP PO POWD: 17.0000 g | Freq: Every day | ORAL | Status: DC

## 2012-10-17 MED ORDER — 0.9 % SODIUM CHLORIDE (POUR BTL) OPTIME
TOPICAL | Status: DC | PRN
  Administered 2012-10-17: 1000 mL

## 2012-10-17 MED ORDER — PROPOFOL 10 MG/ML IV BOLUS
INTRAVENOUS | Status: DC | PRN
  Administered 2012-10-17: 160 mg via INTRAVENOUS
  Administered 2012-10-17: 40 mg via INTRAVENOUS

## 2012-10-17 MED ORDER — ACETAMINOPHEN 10 MG/ML IV SOLN
1000.0000 mg | Freq: Once | INTRAVENOUS | Status: AC
  Administered 2012-10-17: 1000 mg via INTRAVENOUS
  Filled 2012-10-17: qty 100

## 2012-10-17 MED ORDER — PHENYLEPHRINE HCL 10 MG/ML IJ SOLN
INTRAMUSCULAR | Status: DC | PRN
  Administered 2012-10-17: 80 ug via INTRAVENOUS
  Administered 2012-10-17 (×2): 40 ug via INTRAVENOUS

## 2012-10-17 MED ORDER — LACTATED RINGERS IV SOLN
INTRAVENOUS | Status: DC | PRN
  Administered 2012-10-17: 09:00:00 via INTRAVENOUS

## 2012-10-17 MED ORDER — MUPIROCIN 2 % EX OINT
TOPICAL_OINTMENT | Freq: Two times a day (BID) | CUTANEOUS | Status: DC
  Filled 2012-10-17: qty 22

## 2012-10-17 MED ORDER — HYDROMORPHONE HCL PF 1 MG/ML IJ SOLN
INTRAMUSCULAR | Status: AC
  Filled 2012-10-17: qty 1

## 2012-10-17 MED ORDER — OXYCODONE-ACETAMINOPHEN 10-325 MG PO TABS: 1.0000 | ORAL_TABLET | ORAL | Status: DC | PRN

## 2012-10-17 MED ORDER — DOCUSATE SODIUM 100 MG PO CAPS: 100.0000 mg | ORAL_CAPSULE | Freq: Three times a day (TID) | ORAL | Status: DC | PRN

## 2012-10-17 MED ORDER — FENTANYL CITRATE 0.05 MG/ML IJ SOLN
INTRAMUSCULAR | Status: DC | PRN
  Administered 2012-10-17: 100 ug via INTRAVENOUS

## 2012-10-17 SURGICAL SUPPLY — 40 items
CANISTER SUCTION 2500CC (MISCELLANEOUS) ×2 IMPLANT
CLOSURE STERI-STRIP 1/4X4 (GAUZE/BANDAGES/DRESSINGS) ×1 IMPLANT
CLOTH BEACON ORANGE TIMEOUT ST (SAFETY) ×2 IMPLANT
DRAPE INCISE IOBAN 66X45 STRL (DRAPES) ×2 IMPLANT
DRAPE PED LAPAROTOMY (DRAPES) ×2 IMPLANT
DRAPE SURG 17X23 STRL (DRAPES) ×2 IMPLANT
DRAPE U-SHAPE 47X51 STRL (DRAPES) ×2 IMPLANT
DRSG MEPILEX BORDER 4X4 (GAUZE/BANDAGES/DRESSINGS) ×2 IMPLANT
DURAPREP 26ML APPLICATOR (WOUND CARE) ×2 IMPLANT
ELECT CAUTERY BLADE 6.4 (BLADE) IMPLANT
ELECT REM PT RETURN 9FT ADLT (ELECTROSURGICAL) ×2
ELECTRODE REM PT RTRN 9FT ADLT (ELECTROSURGICAL) ×1 IMPLANT
GLOVE BIOGEL PI IND STRL 6.5 (GLOVE) ×1 IMPLANT
GLOVE BIOGEL PI IND STRL 8.5 (GLOVE) ×1 IMPLANT
GLOVE BIOGEL PI INDICATOR 6.5 (GLOVE) ×1
GLOVE BIOGEL PI INDICATOR 8.5 (GLOVE) ×1
GLOVE ECLIPSE 6.0 STRL STRAW (GLOVE) ×2 IMPLANT
GLOVE ECLIPSE 8.5 STRL (GLOVE) ×4 IMPLANT
GOWN PREVENTION PLUS XXLARGE (GOWN DISPOSABLE) ×2 IMPLANT
GOWN STRL NON-REIN LRG LVL3 (GOWN DISPOSABLE) ×4 IMPLANT
KIT BASIN OR (CUSTOM PROCEDURE TRAY) ×2 IMPLANT
KIT ROOM TURNOVER OR (KITS) ×2 IMPLANT
NEEDLE 22X1 1/2 (OR ONLY) (NEEDLE) IMPLANT
NS IRRIG 1000ML POUR BTL (IV SOLUTION) ×2 IMPLANT
PACK SURGICAL SETUP 50X90 (CUSTOM PROCEDURE TRAY) ×2 IMPLANT
PACK UNIVERSAL I (CUSTOM PROCEDURE TRAY) ×2 IMPLANT
PAD ARMBOARD 7.5X6 YLW CONV (MISCELLANEOUS) ×4 IMPLANT
PENCIL BUTTON HOLSTER BLD 10FT (ELECTRODE) ×2 IMPLANT
SPONGE LAP 4X18 X RAY DECT (DISPOSABLE) IMPLANT
STAPLER VISISTAT 35W (STAPLE) IMPLANT
STRIP CLOSURE SKIN 1/2X4 (GAUZE/BANDAGES/DRESSINGS) ×2 IMPLANT
SUT MNCRL AB 3-0 PS2 18 (SUTURE) ×2 IMPLANT
SUT VIC AB 1 CT1 27 (SUTURE) ×2
SUT VIC AB 1 CT1 27XBRD ANBCTR (SUTURE) ×1 IMPLANT
SUT VIC AB 2-0 CT1 18 (SUTURE) IMPLANT
SYR BULB IRRIGATION 50ML (SYRINGE) ×2 IMPLANT
SYR CONTROL 10ML LL (SYRINGE) IMPLANT
TOWEL OR 17X24 6PK STRL BLUE (TOWEL DISPOSABLE) ×4 IMPLANT
TOWEL OR 17X26 10 PK STRL BLUE (TOWEL DISPOSABLE) ×2 IMPLANT
WATER STERILE IRR 1000ML POUR (IV SOLUTION) ×2 IMPLANT

## 2012-10-17 NOTE — Preoperative (Signed)
Beta Blockers   Reason not to administer Beta Blockers:Not Applicable 

## 2012-10-17 NOTE — Progress Notes (Signed)
Orthopedic Tech Progress Note Patient Details:  Lori Potts 1946/01/14 [id number]  Patient ID: Lori Potts, female   DOB: 1945-09-06, 67 y.o.   MRN: [id number] Brace order completed by [name], Lori Potts 10/17/2012, 2:58 PM

## 2012-10-17 NOTE — Op Note (Signed)
NAME:  Lori Potts, Lori Potts NO.:  [id number]  MEDICAL RECORD NO.:  [id number]  LOCATION:  [facility]                         FACILITY:  [facility]  PHYSICIAN:  Alvy Beal, MD    DATE OF BIRTH:  Dec 25, 1945  DATE OF PROCEDURE:  10/17/2012 DATE OF DISCHARGE:                              OPERATIVE REPORT   PREOPERATIVE DIAGNOSIS:  Symptomatic orthopedic hardware (spinal cord stimulator battery).  POSTOPERATIVE DIAGNOSIS:  Symptomatic orthopedic hardware (spinal cord stimulator battery).  OPERATIVE PROCEDURE:  Removal of symptomatic hardware.  HISTORY:  This is a very pleasant woman who has been under my care for several years.  She had a spinal cord stimulator placed with initial success over the last year, so the battery site has been quite painful. Ultimately gave her Marcaine injection in the office, which completely relieved her pain temporarily.  Because of the pain and ineffectiveness of the actual unit any longer, she elected to proceed with removal of the symptomatic hardware.  All appropriate risks, benefits, and alternatives of surgery were discussed with the patient and consent was obtained.  OPERATIVE NOTE:  The patient was brought to the operating room, placed supine on the operating room table.  After successful induction of general anesthesia and endotracheal intubation, TEDs and SCDs were applied.  She was turned into a lazy lateral position on a beanbag chair.  The previous incision was prepped out and draped in a sterile fashion.  Time-out was done confirming patient, procedure, and all other pertinent important data.  Once this was completed, we then proceeded with surgery.  I infiltrated the surgical site and the tissue surrounding it with a total 20 mL of 0.25% Marcaine.  I then made an incision through the previous incision and then dissected sharply down to the battery itself.  Once I identified the battery, I then cut the suture that was keeping it  intact and then delivered it out of the wound.  I then removed the wires, which were noted to be intact and placed the main back into the cavity.  I then irrigated the wound copiously normal saline and closed in a layered fashion with #1 Vicryl suture, 2-0 Vicryl suture, and 3-0 Monocryl.  Steri-Strips and dry dressing were applied.  The patient was ultimately extubated and transferred to the PACU without incident.  At the end of the case, all needle and sponge counts were correct.  There was no adverse intraoperative events.     Alvy Beal, MD     DDB/MEDQ  D:  10/17/2012  T:  10/17/2012  Job:  [id number]

## 2012-10-17 NOTE — Anesthesia Preprocedure Evaluation (Addendum)
Anesthesia Evaluation  Patient identified by MRN, date of birth, ID band Patient awake    Reviewed: Allergy & Precautions, H&P , NPO status , Patient's Chart, lab work & pertinent test results  Airway Mallampati: II      Dental   Pulmonary neg pulmonary ROS,  breath sounds clear to auscultation        Cardiovascular hypertension, Rhythm:Regular Rate:Normal     Neuro/Psych    GI/Hepatic Neg liver ROS, GERD-  ,  Endo/Other  diabetes  Renal/GU negative Renal ROS     Musculoskeletal   Abdominal   Peds  Hematology   Anesthesia Other Findings   Reproductive/Obstetrics                          Anesthesia Physical Anesthesia Plan  ASA: III  Anesthesia Plan: General   Post-op Pain Management:    Induction: Intravenous  Airway Management Planned: Oral ETT  Additional Equipment:   Intra-op Plan:   Post-operative Plan: Extubation in OR  Informed Consent:   Dental advisory given  Plan Discussed with: CRNA, Anesthesiologist and Surgeon  Anesthesia Plan Comments:        Anesthesia Quick Evaluation

## 2012-10-17 NOTE — Progress Notes (Signed)
Orthopedic Tech Progress Note Patient Details:  Lori Potts 08-Feb-1946 [id number]  Patient ID: [name], female   DOB: November 09, 1945, 67 y.o.   MRN: [id number]   [name] 10/17/2012, 9:33 AMCALLED BIO-TECH FOR LUMBAR CORSET.

## 2012-10-17 NOTE — Progress Notes (Signed)
CBG taken in PACU per Judeth Cornfield, RN [phone]

## 2012-10-17 NOTE — Brief Op Note (Signed)
10/17/2012  9:25 AM  PATIENT:  Lori Potts  67 y.o. female  PRE-OPERATIVE DIAGNOSIS:  SYMPTOMATIC BATTERY  POST-OPERATIVE DIAGNOSIS:  SYMPTOMATIC BATTERY  PROCEDURE:  Procedure(s): SPINAL CORD STIMULATOR BATTERY REMOVAL (N/A)  SURGEON:  Surgeon(s) and Role:    * Venita Lick, MD - Primary  PHYSICIAN ASSISTANT:   ASSISTANTS: none   ANESTHESIA:   general  EBL:     BLOOD ADMINISTERED:none  DRAINS: none   LOCAL MEDICATIONS USED:  MARCAINE     SPECIMEN:  Battery   DISPOSITION OF SPECIMEN:  PATHOLOGY  COUNTS:  YES  TOURNIQUET:  * No tourniquets in log *  DICTATION: .Other Dictation: Dictation Number [id number]  PLAN OF CARE: Discharge to home after PACU  PATIENT DISPOSITION:  PACU - hemodynamically stable.

## 2012-10-17 NOTE — H&P (Signed)
Lori Potts is an 67 y.o. female.   Chief Complaint: painful hardware HPI: patient with symptomatic hardware.  Pain relieved with injection.  Plan on removal of painful hardware  Past Medical History  Diagnosis Date  . Hypertension   . Diabetes mellitus without complication   . GERD (gastroesophageal reflux disease)   . Arthritis     Past Surgical History  Procedure Laterality Date  . Joint replacement      left knee  . Abdominal hysterectomy    . Breast surgery      Augementation  . Back surgery      Lumbar fusion x 2  . Revision of scar tissue rectus muscle    . Pain stimulator      History reviewed. No pertinent family history. Social History:  reports that she has quit smoking. She does not have any smokeless tobacco history on file. She reports that she does not drink alcohol or use illicit drugs.  Allergies: No Known Allergies  Medications Prior to Admission  Medication Sig Dispense Refill  . atorvastatin (LIPITOR) 40 MG tablet Take 40 mg by mouth daily.      . citalopram (CELEXA) 20 MG tablet Take 20 mg by mouth daily as needed. Will take for stressful days.      Marland Kitchen esomeprazole (NEXIUM) 40 MG capsule Take 40 mg by mouth daily before breakfast.      . HYDROcodone-acetaminophen (NORCO) 10-325 MG per tablet Take 1 tablet by mouth every 4 (four) hours as needed for pain.      Marland Kitchen lisinopril-hydrochlorothiazide (PRINZIDE,ZESTORETIC) 20-25 MG per tablet Take 1 tablet by mouth daily.      . saxagliptin HCl (ONGLYZA) 5 MG TABS tablet Take 5 mg by mouth daily.      . Vitamin D, Ergocalciferol, (DRISDOL) 50000 UNITS CAPS Take 50,000 Units by mouth every 30 (thirty) days.        Results for orders placed during the hospital encounter of 10/17/12 (from the past 48 hour(s))  BASIC METABOLIC PANEL     Status: Abnormal   Collection Time    10/17/12  6:17 AM      Result Value Range   Sodium 138  135 - 145 mEq/L   Potassium 3.6  3.5 - 5.1 mEq/L   Chloride 99  96 - 112 mEq/L     CO2 28  19 - 32 mEq/L   Glucose, Bld 138 (*) 70 - 99 mg/dL   BUN 17  6 - 23 mg/dL   Creatinine, Ser 8.29  0.50 - 1.10 mg/dL   Calcium 56.2  8.4 - 13.0 mg/dL   GFR calc non Af Amer 89 (*) >90 mL/min   GFR calc Af Amer >90  >90 mL/min   Comment:            The eGFR has been calculated     using the CKD EPI equation.     This calculation has not been     validated in all clinical     situations.     eGFR's persistently     <90 mL/min signify     possible Chronic Kidney Disease.  CBC     Status: Abnormal   Collection Time    10/17/12  6:17 AM      Result Value Range   WBC 5.1  4.0 - 10.5 K/uL   RBC 4.49  3.87 - 5.11 MIL/uL   Hemoglobin 11.7 (*) 12.0 - 15.0 g/dL   HCT 86.5 (*) 78.4 -  46.0 %   MCV 75.9 (*) 78.0 - 100.0 fL   MCH 26.1  26.0 - 34.0 pg   MCHC 34.3  30.0 - 36.0 g/dL   RDW 11.9  14.7 - 82.9 %   Platelets 296  150 - 400 K/uL  SURGICAL PCR SCREEN     Status: None   Collection Time    10/17/12  6:18 AM      Result Value Range   MRSA, PCR NEGATIVE  NEGATIVE   Staphylococcus aureus NEGATIVE  NEGATIVE   Comment:            The Xpert SA Assay (FDA     approved for NASAL specimens     in patients over 25 years of age),     is one component of     a comprehensive surveillance     program.  Test performance has     been validated by [employer] for patients greater     than or equal to 5 year old.     It is not intended     to diagnose infection nor to     guide or monitor treatment.  GLUCOSE, CAPILLARY     Status: Abnormal   Collection Time    10/17/12  6:53 AM      Result Value Range   Glucose-Capillary 134 (*) 70 - 99 mg/dL   Dg Chest 2 View  5/62/1308  *RADIOLOGY REPORT*  Clinical Data: Preop for removal of pain stimulator.  History of hypertension.  CHEST - 2 VIEW  Comparison: CT chest 10/05/2010  Findings: Intrathecal stimulator lead tips demonstrated at the level of T9.  Surgical clips in the left upper quadrant. The heart size and pulmonary  vascularity are normal. The lungs appear clear and expanded without focal air space disease or consolidation. No blunting of the costophrenic angles.  No pneumothorax.  Mediastinal contours appear intact.  Calcified and tortuous aorta.  Mild degenerative change of the thoracic spine.  IMPRESSION: The no evidence of active pulmonary disease.   Original Report Authenticated By: Burman Nieves, M.D.     Review of Systems  Constitutional: Negative.   HENT: Negative.   Eyes: Negative.   Respiratory: Negative.   Cardiovascular: Negative.   Gastrointestinal: Negative.   Genitourinary: Negative.   Musculoskeletal: Positive for back pain (Pain at spinal cord stimulator battery site.  ).  Skin: Negative.   Neurological: Negative.   Psychiatric/Behavioral: Negative.     Blood pressure 121/56, pulse 63, temperature 97.8 F (36.6 C), temperature source Oral, resp. rate 18, height 5\' 3"  (1.6 m), weight 83.462 kg (184 lb), SpO2 96.00%. Physical Exam  Constitutional: She is oriented to person, place, and time. She appears well-developed and well-nourished.  HENT:  Head: Normocephalic and atraumatic.  Cardiovascular: Normal rate and regular rhythm.   Respiratory: Effort normal and breath sounds normal.  GI: Soft. Bowel sounds are normal.  Musculoskeletal: Normal range of motion.  Neurological: She is alert and oriented to person, place, and time. She has normal reflexes.  Skin:     Right spinal cord stimulator battery site Pain with palpation Temporary relief with marcaine injection     Assessment/Plan Patient with painful battery site Relieved with injection Plan on removal of spinal cord stimulator battery  Madhavi Hamblen D 10/17/2012, 8:11 AM

## 2012-10-17 NOTE — Anesthesia Postprocedure Evaluation (Signed)
  Anesthesia Post-op Note  Patient: Lori Potts  Procedure(s) Performed: Procedure(s): SPINAL CORD STIMULATOR BATTERY REMOVAL (N/A)  Patient Location: PACU  Anesthesia Type:General  Level of Consciousness: awake  Airway and Oxygen Therapy: Patient Spontanous Breathing  Post-op Pain: mild  Post-op Assessment: Post-op Vital signs reviewed  Post-op Vital Signs: Reviewed  Complications: No apparent anesthesia complications

## 2012-10-17 NOTE — Transfer of Care (Signed)
Immediate Anesthesia Transfer of Care Note  Patient: Lori Potts  Procedure(s) Performed: Procedure(s): SPINAL CORD STIMULATOR BATTERY REMOVAL (N/A)  Patient Location: PACU  Anesthesia Type:General  Level of Consciousness: awake, alert  and oriented  Airway & Oxygen Therapy: Patient Spontanous Breathing  Post-op Assessment: Report given to PACU RN, Post -op Vital signs reviewed and stable and Patient moving all extremities X 4  Post vital signs: Reviewed and stable  Complications: No apparent anesthesia complications

## 2012-10-17 NOTE — H&P (Signed)
  NO change in clinical exam H+P reviewed

## 2012-10-18 ENCOUNTER — Encounter (HOSPITAL_COMMUNITY): Payer: Self-pay | Admitting: Orthopedic Surgery

## 2012-11-14 ENCOUNTER — Institutional Professional Consult (permissible substitution): Payer: Self-pay | Admitting: Diagnostic Neuroimaging

## 2013-02-20 ENCOUNTER — Other Ambulatory Visit: Payer: Self-pay | Admitting: Orthopedic Surgery

## 2013-02-20 DIAGNOSIS — M549 Dorsalgia, unspecified: Secondary | ICD-10-CM

## 2013-02-26 ENCOUNTER — Ambulatory Visit
Admission: RE | Admit: 2013-02-26 | Discharge: 2013-02-26 | Disposition: A | Discharge status: Home / Self Care | Payer: Managed Care, Other (non HMO) | Source: Ambulatory Visit | Attending: Orthopedic Surgery | Admitting: Orthopedic Surgery

## 2013-02-26 VITALS — BP 117/55 | HR 66

## 2013-02-26 DIAGNOSIS — M549 Dorsalgia, unspecified: Secondary | ICD-10-CM

## 2013-02-26 MED ORDER — ONDANSETRON HCL 4 MG/2ML IJ SOLN
4.0000 mg | Freq: Once | INTRAMUSCULAR | Status: AC
  Administered 2013-02-26: 4 mg via INTRAMUSCULAR

## 2013-02-26 MED ORDER — IOHEXOL 180 MG/ML  SOLN
15.0000 mL | Freq: Once | INTRAMUSCULAR | Status: AC | PRN
  Administered 2013-02-26: 15 mL via INTRATHECAL

## 2013-02-26 MED ORDER — MEPERIDINE HCL 100 MG/ML IJ SOLN
100.0000 mg | Freq: Once | INTRAMUSCULAR | Status: AC
  Administered 2013-02-26: 100 mg via INTRAMUSCULAR

## 2013-02-26 MED ORDER — OXYCODONE-ACETAMINOPHEN 5-325 MG PO TABS
2.0000 | ORAL_TABLET | Freq: Once | ORAL | Status: AC
  Administered 2013-02-26: 2 via ORAL

## 2013-02-26 MED ORDER — DIAZEPAM 5 MG PO TABS
5.0000 mg | ORAL_TABLET | Freq: Once | ORAL | Status: AC
  Administered 2013-02-26: 5 mg via ORAL

## 2013-02-26 NOTE — Progress Notes (Signed)
Discharge instructions explained to pt. 

## 2013-03-04 ENCOUNTER — Emergency Department: Payer: Self-pay | Admitting: Emergency Medicine

## 2013-03-04 LAB — CBC
HCT: 34.6 % — ABNORMAL LOW (ref 35.0–47.0)
HGB: 11.5 g/dL — ABNORMAL LOW (ref 12.0–16.0)
MCH: 25.6 pg — ABNORMAL LOW (ref 26.0–34.0)
MCHC: 33.2 g/dL (ref 32.0–36.0)
MCV: 77 fL — ABNORMAL LOW (ref 80–100)
Platelet: 303 10*3/uL (ref 150–440)
RBC: 4.49 10*6/uL (ref 3.80–5.20)
RDW: 14.5 % (ref 11.5–14.5)
WBC: 5.2 10*3/uL (ref 3.6–11.0)

## 2013-03-04 LAB — TROPONIN I
Troponin-I: <0.02 ng/mL
Troponin-I: <0.02 ng/mL

## 2013-03-04 LAB — BASIC METABOLIC PANEL
Anion Gap: 4 — ABNORMAL LOW (ref 7–16)
BUN: 17 mg/dL (ref 7–18)
Calcium, Total: 9.5 mg/dL (ref 8.5–10.1)
Chloride: 108 mmol/L — ABNORMAL HIGH (ref 98–107)
Co2: 29 mmol/L (ref 21–32)
Creatinine: 0.75 mg/dL (ref 0.60–1.30)
EGFR (African American): >60
EGFR (Non-African Amer.): >60
Glucose: 149 mg/dL — ABNORMAL HIGH (ref 65–99)
Osmolality: 286 (ref 275–301)
Potassium: 3.7 mmol/L (ref 3.5–5.1)
Sodium: 141 mmol/L (ref 136–145)

## 2013-03-14 ENCOUNTER — Encounter: Payer: Self-pay | Admitting: *Deleted

## 2013-03-19 ENCOUNTER — Encounter: Payer: Self-pay | Admitting: *Deleted

## 2013-03-19 NOTE — H&P (Signed)
History of Present Illness The patient is a 67 year old female who presents today for follow up of their back. The patient is being followed for their central back pain. They are now 8 year(s) (+) out from surgery (second lumbar fusion ). Symptoms reported today include: pain (lower lumbar radiating into the right lower ext. to the level of the lateral calf ) and weakness (right lower ext.), while the patient does not report symptoms of: numbness or urinary incontinence. The following medication has been used for pain control: Oxycodone (10/325mg ). The patient presents today following myelogram (CT ).    Subjective Transcription  Lori Potts returns today for a followup. We have reviewed her CT scan which shows obvious adjacent segment degeneration and collapse at the 3-4 level which as progressed from her previous imaging study.    At this point in time, despite conservative care, including a spinal cord stimulator, pain medical management and injection therapy her quality of life continues to deteriorate.   Allergies No Known Drug Allergies. 03/30/2011   Social History Tobacco use. former smoker; smoke(d) less than 1/2 pack(s) per day   Medication History Crestor ( Oral) Specific dose unknown - Active. NexIUM (40MG  Packet, Oral) Active. Lopressor HCT ( Oral) Specific dose unknown - Active. Onglyza (5MG  Tablet, Oral) Active. Percocet (10-325MG  Tablet, 1 (one) Tablet Oral four times daily, as needed, Taken 01/30/2013 to 03/01/2013) Inactive.   Objective Transcription  She is a pleasant woman who appears younger than her stated age. She is alert and oriented times three. No shortness of breath or chest pain. The abdomen is soft and nontender. She had a previous left kidney surgery which has left significant scar tissue on the left side. On the right side there is no incision. She has a well healed lumbar incision.    At this point in time, she ambulates with a limp  due to pain. She is grossly neurologically intact. EHL, tibialis anterior and gastrocnemius is 5/5. No foot drop. Negative Babinski, no clonus. Severe back pain with palpation and range of motion.  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 l Risks of surgery include, but are not limited to: Death, stroke, paralysis, nerve root damage/injury, bleeding, blood clots, loss of bowel/bladder control, sexual dysfunction, retrograde ejaculation, hardware failure, or malposition, spinal fluid leak, adjacent segment disease, non-union, need for further surgery, ongoing or worse pain, injury to bladder, bowel and abdominal contents, infection and recurrent disc herniation  l We have gone over the risks and benefits of surgery, which include infection, bleeding, nerve damage, death, stroke, paralysis, failure to heal, need for further surgery, ongoing or worse pain, loss of fixation, need for further surgery, CSF leak, loss of bowel or bladder control, ongoing or worse pain.   Chronic pain syndrome (338.4)  Lumbar/Lumbosacral Disc Degeneration    Plans Transcription  At this point in time, based on the fact that the adjacent segment has deteriorated further and she is having significant pain, we will plan on proceeding with an L3-4 lateral fusion and instrumentation. This will allow [place] to stabilize the adjacent degenerated level, restore the disk height and hopefully diminish, but not eliminate her pain.  She also has a right foraminal disk protrusion causing milkd to moderate stenosis. This can also be contributing to her right leg pain and so at the time of her XLIF at 3-4 I will also do  a posterior right sided decompression at L2-3 to assure there is adequate room for the exiting L2 nerve root at that level.  I did review the risk  with him to include infection, bleeding, nerve damage, death , stroke, paralysis, failure to heal, need for further surgery. All questions were encouraged and answered.  Once we have preoperative medical clearance from her primary medical physician we will plan on proceeding with her surgery.

## 2013-03-20 ENCOUNTER — Encounter (HOSPITAL_COMMUNITY)
Admission: RE | Admit: 2013-03-20 | Discharge: 2013-03-20 | Disposition: A | Discharge status: Home / Self Care | Payer: Managed Care, Other (non HMO) | Source: Ambulatory Visit | Attending: Orthopedic Surgery | Admitting: Orthopedic Surgery

## 2013-03-20 ENCOUNTER — Ambulatory Visit: Payer: Managed Care, Other (non HMO) | Admitting: Cardiovascular Disease

## 2013-03-20 ENCOUNTER — Encounter (HOSPITAL_COMMUNITY): Payer: Self-pay | Admitting: Pharmacy Technician

## 2013-03-20 ENCOUNTER — Encounter (HOSPITAL_COMMUNITY): Payer: Self-pay

## 2013-03-20 DIAGNOSIS — M48061 Spinal stenosis, lumbar region without neurogenic claudication: Secondary | ICD-10-CM | POA: Diagnosis present

## 2013-03-20 DIAGNOSIS — M549 Dorsalgia, unspecified: Secondary | ICD-10-CM | POA: Diagnosis present

## 2013-03-20 DIAGNOSIS — K219 Gastro-esophageal reflux disease without esophagitis: Secondary | ICD-10-CM | POA: Diagnosis present

## 2013-03-20 DIAGNOSIS — G894 Chronic pain syndrome: Secondary | ICD-10-CM | POA: Diagnosis present

## 2013-03-20 DIAGNOSIS — E119 Type 2 diabetes mellitus without complications: Secondary | ICD-10-CM | POA: Diagnosis present

## 2013-03-20 DIAGNOSIS — M129 Arthropathy, unspecified: Secondary | ICD-10-CM | POA: Diagnosis present

## 2013-03-20 DIAGNOSIS — I10 Essential (primary) hypertension: Secondary | ICD-10-CM | POA: Diagnosis present

## 2013-03-20 LAB — CBC
HCT: 34 % — ABNORMAL LOW (ref 36.0–46.0)
Hemoglobin: 12 g/dL (ref 12.0–15.0)
MCH: 26.9 pg (ref 26.0–34.0)
MCHC: 35.3 g/dL (ref 30.0–36.0)
MCV: 76.2 fL — ABNORMAL LOW (ref 78.0–100.0)
Platelets: 312 10*3/uL (ref 150–400)
RBC: 4.46 MIL/uL (ref 3.87–5.11)
RDW: 13.7 % (ref 11.5–15.5)
WBC: 4.6 10*3/uL (ref 4.0–10.5)

## 2013-03-20 LAB — BASIC METABOLIC PANEL
BUN: 12 mg/dL (ref 6–23)
CO2: 29 mEq/L (ref 19–32)
Calcium: 10 mg/dL (ref 8.4–10.5)
Chloride: 102 mEq/L (ref 96–112)
Creatinine, Ser: 0.66 mg/dL (ref 0.50–1.10)
GFR calc Af Amer: >90 mL/min (ref >90)
GFR calc non Af Amer: 89 mL/min — ABNORMAL LOW (ref >90)
Glucose, Bld: 147 mg/dL — ABNORMAL HIGH (ref 70–99)
Potassium: 3.6 mEq/L (ref 3.5–5.1)
Sodium: 139 mEq/L (ref 135–145)

## 2013-03-20 LAB — TYPE AND SCREEN
ABO/RH(D): O POS
Antibody Screen: NEGATIVE

## 2013-03-20 LAB — SURGICAL PCR SCREEN
MRSA, PCR: NEGATIVE
Staphylococcus aureus: NEGATIVE

## 2013-03-20 LAB — ABO/RH: ABO/RH(D): O POS

## 2013-03-20 MED ORDER — CEFAZOLIN SODIUM-DEXTROSE 2-3 GM-% IV SOLR
2.0000 g | INTRAVENOUS | Status: AC
  Administered 2013-03-21 (×2): 2 g via INTRAVENOUS
  Filled 2013-03-20: qty 50

## 2013-03-20 MED ORDER — ACETAMINOPHEN 10 MG/ML IV SOLN
1000.0000 mg | Freq: Once | INTRAVENOUS | Status: AC
  Administered 2013-03-21: 1000 mg via INTRAVENOUS
  Filled 2013-03-20: qty 100

## 2013-03-20 MED ORDER — DEXAMETHASONE SODIUM PHOSPHATE 4 MG/ML IJ SOLN
4.0000 mg | Freq: Once | INTRAMUSCULAR | Status: AC
  Administered 2013-03-21: 4 mg via INTRAVENOUS
  Filled 2013-03-20: qty 1

## 2013-03-20 NOTE — Progress Notes (Signed)
req'd echo, stress from [facility] done last week per pt

## 2013-03-20 NOTE — Pre-Procedure Instructions (Addendum)
WILSON SAMPLE  03/20/2013   Your procedure is scheduled on:  03/21/13  Report to [id number] AM.  Call this number if you have problems the morning of surgery: [phone]   Remember:   Do not eat food or drink liquids after midnight.   Take these medicines the morning of surgery with A SIP OF WATER: nexium, pain med                             BRING BRACE TO HOSP   Do not wear jewelry, make-up or nail polish.  Do not wear lotions, powders, or perfumes. You may wear deodorant.  Do not shave 48 hours prior to surgery. Men may shave face and neck.  Do not bring valuables to the hospital.  [facility] is not responsible                   for any belongings or valuables.  Contacts, dentures or bridgework may not be worn into surgery.  Leave suitcase in the car. After surgery it may be brought to your room.  For patients admitted to the hospital, checkout time is 11:00 AM the day of  discharge.   Patients discharged the day of surgery will not be allowed to drive  home.  Name and phone number of your driver:  Special Instructions: Shower using CHG 2 nights before surgery and the night before surgery.  If you shower the day of surgery use CHG.  Use special wash - you have one bottle of CHG for all showers.  You should use approximately 1/3 of the bottle for each shower.   Please read over the following fact sheets that you were given: Pain Booklet, Coughing and Deep Breathing, Blood Transfusion Information, MRSA Information and Surgical Site Infection Prevention

## 2013-03-21 ENCOUNTER — Encounter (HOSPITAL_COMMUNITY): Payer: Self-pay | Admitting: Certified Registered Nurse Anesthetist

## 2013-03-21 ENCOUNTER — Inpatient Hospital Stay (HOSPITAL_COMMUNITY): Payer: Managed Care, Other (non HMO)

## 2013-03-21 ENCOUNTER — Inpatient Hospital Stay (HOSPITAL_COMMUNITY)
Admission: RE | Admit: 2013-03-21 | Discharge: 2013-03-23 | DRG: 460 | Disposition: A | Discharge status: Home / Self Care | Payer: Managed Care, Other (non HMO) | Source: Ambulatory Visit | Attending: Orthopedic Surgery | Admitting: Orthopedic Surgery

## 2013-03-21 ENCOUNTER — Encounter (HOSPITAL_COMMUNITY)
Admission: RE | Disposition: A | Discharge status: Home / Self Care | Payer: Self-pay | Source: Ambulatory Visit | Attending: Orthopedic Surgery

## 2013-03-21 ENCOUNTER — Inpatient Hospital Stay (HOSPITAL_COMMUNITY): Payer: Managed Care, Other (non HMO) | Admitting: Certified Registered Nurse Anesthetist

## 2013-03-21 DIAGNOSIS — E119 Type 2 diabetes mellitus without complications: Secondary | ICD-10-CM | POA: Diagnosis present

## 2013-03-21 DIAGNOSIS — M48061 Spinal stenosis, lumbar region without neurogenic claudication: Principal | ICD-10-CM | POA: Diagnosis present

## 2013-03-21 DIAGNOSIS — G894 Chronic pain syndrome: Secondary | ICD-10-CM | POA: Diagnosis present

## 2013-03-21 DIAGNOSIS — M129 Arthropathy, unspecified: Secondary | ICD-10-CM | POA: Diagnosis present

## 2013-03-21 DIAGNOSIS — I10 Essential (primary) hypertension: Secondary | ICD-10-CM | POA: Diagnosis present

## 2013-03-21 DIAGNOSIS — K219 Gastro-esophageal reflux disease without esophagitis: Secondary | ICD-10-CM | POA: Diagnosis present

## 2013-03-21 HISTORY — PX: POSTERIOR CERVICAL FUSION/FORAMINOTOMY: SHX5038

## 2013-03-21 HISTORY — PX: ANTERIOR LAT LUMBAR FUSION: SHX1168

## 2013-03-21 LAB — GLUCOSE, CAPILLARY
Glucose-Capillary: 127 mg/dL — ABNORMAL HIGH (ref 70–99)
Glucose-Capillary: 108 mg/dL — ABNORMAL HIGH (ref 70–99)
Glucose-Capillary: 185 mg/dL — ABNORMAL HIGH (ref 70–99)
Glucose-Capillary: 167 mg/dL — ABNORMAL HIGH (ref 70–99)

## 2013-03-21 SURGERY — ANTERIOR LATERAL LUMBAR FUSION 1 LEVEL
Anesthesia: General | Site: Spine Lumbar | Laterality: Right | Wound class: Clean

## 2013-03-21 MED ORDER — VECURONIUM BROMIDE 10 MG IV SOLR
INTRAVENOUS | Status: DC | PRN
  Administered 2013-03-21: 6 mg via INTRAVENOUS
  Administered 2013-03-21: 2 mg via INTRAVENOUS

## 2013-03-21 MED ORDER — METHOCARBAMOL 100 MG/ML IJ SOLN
500.0000 mg | Freq: Four times a day (QID) | INTRAVENOUS | Status: DC | PRN
  Filled 2013-03-21: qty 5

## 2013-03-21 MED ORDER — PHENOL 1.4 % MT LIQD: 1.0000 | OROMUCOSAL | Status: DC | PRN

## 2013-03-21 MED ORDER — METOPROLOL TARTRATE 25 MG PO TABS
25.0000 mg | ORAL_TABLET | Freq: Two times a day (BID) | ORAL | Status: DC
  Administered 2013-03-21 – 2013-03-23 (×4): 25 mg via ORAL
  Filled 2013-03-21 (×5): qty 1

## 2013-03-21 MED ORDER — ONDANSETRON HCL 4 MG/2ML IJ SOLN: 4.0000 mg | Freq: Four times a day (QID) | INTRAMUSCULAR | Status: DC | PRN

## 2013-03-21 MED ORDER — METOPROLOL TARTRATE 50 MG PO TABS
ORAL_TABLET | ORAL | Status: AC
  Filled 2013-03-21: qty 1

## 2013-03-21 MED ORDER — BUPIVACAINE-EPINEPHRINE 0.25% -1:200000 IJ SOLN
INTRAMUSCULAR | Status: DC | PRN
  Administered 2013-03-21: 10 mL

## 2013-03-21 MED ORDER — METOPROLOL TARTRATE 50 MG PO TABS: 50.0000 mg | ORAL_TABLET | Freq: Once | ORAL | Status: DC

## 2013-03-21 MED ORDER — THROMBIN 20000 UNITS EX SOLR
CUTANEOUS | Status: DC | PRN
  Administered 2013-03-21: 15:00:00 via TOPICAL

## 2013-03-21 MED ORDER — FENTANYL CITRATE 0.05 MG/ML IJ SOLN
INTRAMUSCULAR | Status: DC | PRN
  Administered 2013-03-21: 100 ug via INTRAVENOUS
  Administered 2013-03-21 (×2): 50 ug via INTRAVENOUS
  Administered 2013-03-21: 100 ug via INTRAVENOUS
  Administered 2013-03-21: 150 ug via INTRAVENOUS
  Administered 2013-03-21: 50 ug via INTRAVENOUS
  Administered 2013-03-21: 100 ug via INTRAVENOUS

## 2013-03-21 MED ORDER — HYDROMORPHONE HCL PF 1 MG/ML IJ SOLN
0.2500 mg | INTRAMUSCULAR | Status: DC | PRN
  Administered 2013-03-21 (×2): 0.5 mg via INTRAVENOUS

## 2013-03-21 MED ORDER — NALOXONE HCL 0.4 MG/ML IJ SOLN: 0.4000 mg | INTRAMUSCULAR | Status: DC | PRN

## 2013-03-21 MED ORDER — BUPIVACAINE-EPINEPHRINE PF 0.25-1:200000 % IJ SOLN
INTRAMUSCULAR | Status: AC
  Filled 2013-03-21: qty 30

## 2013-03-21 MED ORDER — 0.9 % SODIUM CHLORIDE (POUR BTL) OPTIME
TOPICAL | Status: DC | PRN
  Administered 2013-03-21: 1000 mL

## 2013-03-21 MED ORDER — MORPHINE SULFATE (PF) 1 MG/ML IV SOLN
INTRAVENOUS | Status: DC
  Administered 2013-03-21: 13 mg via INTRAVENOUS
  Administered 2013-03-21: 18:00:00 via INTRAVENOUS
  Administered 2013-03-22: 7 mg via INTRAVENOUS
  Administered 2013-03-22: 8 mg via INTRAVENOUS
  Administered 2013-03-22: 01:00:00 via INTRAVENOUS
  Filled 2013-03-21: qty 25

## 2013-03-21 MED ORDER — DEXAMETHASONE SODIUM PHOSPHATE 4 MG/ML IJ SOLN
4.0000 mg | Freq: Four times a day (QID) | INTRAMUSCULAR | Status: DC
  Filled 2013-03-21 (×10): qty 1

## 2013-03-21 MED ORDER — FLEET ENEMA 7-19 GM/118ML RE ENEM: 1.0000 | ENEMA | Freq: Once | RECTAL | Status: AC | PRN

## 2013-03-21 MED ORDER — DIPHENHYDRAMINE HCL 12.5 MG/5ML PO ELIX: 12.5000 mg | ORAL_SOLUTION | Freq: Four times a day (QID) | ORAL | Status: DC | PRN

## 2013-03-21 MED ORDER — SODIUM CHLORIDE 0.9 % IJ SOLN: 3.0000 mL | INTRAMUSCULAR | Status: DC | PRN

## 2013-03-21 MED ORDER — LIDOCAINE HCL (CARDIAC) 20 MG/ML IV SOLN
INTRAVENOUS | Status: DC | PRN
  Administered 2013-03-21: 70 mg via INTRAVENOUS

## 2013-03-21 MED ORDER — DIPHENHYDRAMINE HCL 50 MG/ML IJ SOLN: 12.5000 mg | Freq: Four times a day (QID) | INTRAMUSCULAR | Status: DC | PRN

## 2013-03-21 MED ORDER — GLYCOPYRROLATE 0.2 MG/ML IJ SOLN
INTRAMUSCULAR | Status: DC | PRN
  Administered 2013-03-21: 0.6 mg via INTRAVENOUS

## 2013-03-21 MED ORDER — MIDAZOLAM HCL 5 MG/5ML IJ SOLN
INTRAMUSCULAR | Status: DC | PRN
  Administered 2013-03-21: 2 mg via INTRAVENOUS

## 2013-03-21 MED ORDER — ZOLPIDEM TARTRATE 5 MG PO TABS: 5.0000 mg | ORAL_TABLET | Freq: Every evening | ORAL | Status: DC | PRN

## 2013-03-21 MED ORDER — DEXAMETHASONE 4 MG PO TABS
4.0000 mg | ORAL_TABLET | Freq: Four times a day (QID) | ORAL | Status: DC
  Administered 2013-03-21 – 2013-03-23 (×6): 4 mg via ORAL
  Filled 2013-03-21 (×10): qty 1

## 2013-03-21 MED ORDER — NEOSTIGMINE METHYLSULFATE 1 MG/ML IJ SOLN
INTRAMUSCULAR | Status: DC | PRN
  Administered 2013-03-21: 5 mg via INTRAVENOUS

## 2013-03-21 MED ORDER — DOCUSATE SODIUM 100 MG PO CAPS
100.0000 mg | ORAL_CAPSULE | Freq: Two times a day (BID) | ORAL | Status: DC
  Administered 2013-03-21 – 2013-03-23 (×4): 100 mg via ORAL
  Filled 2013-03-21 (×4): qty 1

## 2013-03-21 MED ORDER — MORPHINE SULFATE (PF) 1 MG/ML IV SOLN
INTRAVENOUS | Status: AC
  Filled 2013-03-21: qty 25

## 2013-03-21 MED ORDER — LINAGLIPTIN 5 MG PO TABS
5.0000 mg | ORAL_TABLET | Freq: Every day | ORAL | Status: DC
  Administered 2013-03-21 – 2013-03-23 (×3): 5 mg via ORAL
  Filled 2013-03-21 (×3): qty 1

## 2013-03-21 MED ORDER — LACTATED RINGERS IV SOLN
INTRAVENOUS | Status: DC | PRN
  Administered 2013-03-21 (×4): via INTRAVENOUS

## 2013-03-21 MED ORDER — HYDROMORPHONE HCL PF 1 MG/ML IJ SOLN
INTRAMUSCULAR | Status: AC
  Filled 2013-03-21: qty 1

## 2013-03-21 MED ORDER — PROPOFOL 10 MG/ML IV BOLUS
INTRAVENOUS | Status: DC | PRN
  Administered 2013-03-21: 170 mg via INTRAVENOUS
  Administered 2013-03-21: 80 mg via INTRAVENOUS

## 2013-03-21 MED ORDER — SODIUM CHLORIDE 0.9 % IJ SOLN
3.0000 mL | Freq: Two times a day (BID) | INTRAMUSCULAR | Status: DC
Start: 2013-03-21 — End: 2013-03-23
  Administered 2013-03-22 (×2): 3 mL via INTRAVENOUS

## 2013-03-21 MED ORDER — ALBUMIN HUMAN 5 % IV SOLN
INTRAVENOUS | Status: DC | PRN
  Administered 2013-03-21: 16:00:00 via INTRAVENOUS

## 2013-03-21 MED ORDER — LACTATED RINGERS IV SOLN
INTRAVENOUS | Status: DC
  Administered 2013-03-22: 07:00:00 via INTRAVENOUS

## 2013-03-21 MED ORDER — CEFAZOLIN SODIUM 1-5 GM-% IV SOLN
1.0000 g | Freq: Three times a day (TID) | INTRAVENOUS | Status: AC
  Administered 2013-03-21 – 2013-03-22 (×2): 1 g via INTRAVENOUS
  Filled 2013-03-21 (×2): qty 50

## 2013-03-21 MED ORDER — MENTHOL 3 MG MT LOZG: 1.0000 | LOZENGE | OROMUCOSAL | Status: DC | PRN

## 2013-03-21 MED ORDER — ONDANSETRON HCL 4 MG/2ML IJ SOLN
INTRAMUSCULAR | Status: DC | PRN
  Administered 2013-03-21: 4 mg via INTRAVENOUS

## 2013-03-21 MED ORDER — METOPROLOL TARTRATE 25 MG PO TABS: 25.0000 mg | ORAL_TABLET | Freq: Once | ORAL | Status: AC

## 2013-03-21 MED ORDER — HEMOSTATIC AGENTS (NO CHARGE) OPTIME
TOPICAL | Status: DC | PRN
  Administered 2013-03-21: 1 via TOPICAL

## 2013-03-21 MED ORDER — DEXTROSE 5 % IV SOLN
INTRAVENOUS | Status: DC | PRN
  Administered 2013-03-21: 13:00:00 via INTRAVENOUS

## 2013-03-21 MED ORDER — SODIUM CHLORIDE 0.9 % IJ SOLN: 9.0000 mL | INTRAMUSCULAR | Status: DC | PRN

## 2013-03-21 MED ORDER — CEFAZOLIN SODIUM 1-5 GM-% IV SOLN
INTRAVENOUS | Status: AC
  Filled 2013-03-21: qty 100

## 2013-03-21 MED ORDER — OXYCODONE HCL 5 MG PO TABS
10.0000 mg | ORAL_TABLET | ORAL | Status: DC | PRN
  Administered 2013-03-22 – 2013-03-23 (×7): 10 mg via ORAL
  Filled 2013-03-21 (×7): qty 2

## 2013-03-21 MED ORDER — METOPROLOL TARTRATE 12.5 MG HALF TABLET
ORAL_TABLET | ORAL | Status: AC
  Administered 2013-03-21: 25 mg via ORAL
  Filled 2013-03-21: qty 2

## 2013-03-21 MED ORDER — METHOCARBAMOL 500 MG PO TABS
500.0000 mg | ORAL_TABLET | Freq: Four times a day (QID) | ORAL | Status: DC | PRN
  Administered 2013-03-22 – 2013-03-23 (×4): 500 mg via ORAL
  Filled 2013-03-21 (×4): qty 1

## 2013-03-21 MED ORDER — EPHEDRINE SULFATE 50 MG/ML IJ SOLN
INTRAMUSCULAR | Status: DC | PRN
  Administered 2013-03-21: 10 mg via INTRAVENOUS
  Administered 2013-03-21: 5 mg via INTRAVENOUS
  Administered 2013-03-21: 10 mg via INTRAVENOUS

## 2013-03-21 MED ORDER — INSULIN ASPART 100 UNIT/ML ~~LOC~~ SOLN
0.0000 [IU] | SUBCUTANEOUS | Status: DC
  Administered 2013-03-21: 3 [IU] via SUBCUTANEOUS
  Administered 2013-03-22: 2 [IU] via SUBCUTANEOUS
  Administered 2013-03-22: 3 [IU] via SUBCUTANEOUS
  Administered 2013-03-22: 2 [IU] via SUBCUTANEOUS
  Administered 2013-03-22 (×2): 3 [IU] via SUBCUTANEOUS
  Administered 2013-03-22 – 2013-03-23 (×2): 2 [IU] via SUBCUTANEOUS
  Administered 2013-03-23: 3 [IU] via SUBCUTANEOUS
  Administered 2013-03-23: 2 [IU] via SUBCUTANEOUS

## 2013-03-21 MED ORDER — ONDANSETRON HCL 4 MG/2ML IJ SOLN: 4.0000 mg | INTRAMUSCULAR | Status: DC | PRN

## 2013-03-21 MED ORDER — THROMBIN 20000 UNITS EX SOLR
CUTANEOUS | Status: AC
  Filled 2013-03-21: qty 20000

## 2013-03-21 MED ORDER — ATORVASTATIN CALCIUM 40 MG PO TABS
40.0000 mg | ORAL_TABLET | Freq: Every day | ORAL | Status: DC
  Administered 2013-03-21 – 2013-03-23 (×3): 40 mg via ORAL
  Filled 2013-03-21 (×3): qty 1

## 2013-03-21 MED ORDER — SUCCINYLCHOLINE CHLORIDE 20 MG/ML IJ SOLN
INTRAMUSCULAR | Status: DC | PRN
  Administered 2013-03-21: 100 mg via INTRAVENOUS

## 2013-03-21 MED ORDER — ONDANSETRON HCL 4 MG/2ML IJ SOLN: 4.0000 mg | Freq: Once | INTRAMUSCULAR | Status: DC | PRN

## 2013-03-21 MED ORDER — SODIUM CHLORIDE 0.9 % IV SOLN: 250.0000 mL | INTRAVENOUS | Status: DC

## 2013-03-21 SURGICAL SUPPLY — 92 items
ADH SKN CLS APL DERMABOND .7 (GAUZE/BANDAGES/DRESSINGS) ×2
BLADE SURG 10 STRL SS (BLADE) ×4 IMPLANT
BLADE SURG ROTATE 9660 (MISCELLANEOUS) IMPLANT
BOLT PLATE XLIF 5.5X55 LRG (Bolt) ×1 IMPLANT
BOLT SPNL LRG 45X5.5XPLAT NS (Screw) IMPLANT
BONE MATRIX OSTEOCEL PLUS 5CC (Bone Implant) ×1 IMPLANT
CLOTH BEACON ORANGE TIMEOUT ST (SAFETY) ×3 IMPLANT
CLSR STERI-STRIP ANTIMIC 1/2X4 (GAUZE/BANDAGES/DRESSINGS) ×2 IMPLANT
CORDS BIPOLAR (ELECTRODE) ×3 IMPLANT
COROENT XL-W 8X22X50 (Orthopedic Implant) ×1 IMPLANT
COVER MAYO STAND STRL (DRAPES) ×6 IMPLANT
COVER SURGICAL LIGHT HANDLE (MISCELLANEOUS) ×3 IMPLANT
DERMABOND ADVANCED (GAUZE/BANDAGES/DRESSINGS) ×1
DERMABOND ADVANCED .7 DNX12 (GAUZE/BANDAGES/DRESSINGS) ×2 IMPLANT
DRAPE C-ARM 42X72 X-RAY (DRAPES) ×6 IMPLANT
DRAPE INCISE IOBAN 66X45 STRL (DRAPES) ×3 IMPLANT
DRAPE LAPAROTOMY T 102X78X121 (DRAPES) ×1 IMPLANT
DRAPE ORTHO SPLIT 77X108 STRL (DRAPES) ×3
DRAPE POUCH INSTRU U-SHP 10X18 (DRAPES) ×3 IMPLANT
DRAPE SURG 17X23 STRL (DRAPES) ×3 IMPLANT
DRAPE SURG ORHT 6 SPLT 77X108 (DRAPES) ×2 IMPLANT
DRAPE U-SHAPE 47X51 STRL (DRAPES) ×6 IMPLANT
DRSG ADAPTIC 3X8 NADH LF (GAUZE/BANDAGES/DRESSINGS) ×3 IMPLANT
DRSG MEPILEX BORDER 4X8 (GAUZE/BANDAGES/DRESSINGS) ×4 IMPLANT
DURAPREP 26ML APPLICATOR (WOUND CARE) ×3 IMPLANT
ELECT BLADE 4.0 EZ CLEAN MEGAD (MISCELLANEOUS) ×9
ELECT BLADE 6.5 EXT (BLADE) ×3 IMPLANT
ELECT CAUTERY BLADE 6.4 (BLADE) ×3 IMPLANT
ELECT REM PT RETURN 9FT ADLT (ELECTROSURGICAL) ×3
ELECTRODE BLDE 4.0 EZ CLN MEGD (MISCELLANEOUS) ×2 IMPLANT
ELECTRODE REM PT RTRN 9FT ADLT (ELECTROSURGICAL) ×2 IMPLANT
EVACUATOR 1/8 PVC DRAIN (DRAIN) IMPLANT
GAUZE SPONGE 4X4 16PLY XRAY LF (GAUZE/BANDAGES/DRESSINGS) ×3 IMPLANT
GLOVE BIOGEL PI IND STRL 6.5 (GLOVE) ×2 IMPLANT
GLOVE BIOGEL PI IND STRL 8.5 (GLOVE) ×2 IMPLANT
GLOVE BIOGEL PI INDICATOR 6.5 (GLOVE) ×1
GLOVE BIOGEL PI INDICATOR 8.5 (GLOVE) ×1
GLOVE ECLIPSE 6.0 STRL STRAW (GLOVE) ×3 IMPLANT
GLOVE ECLIPSE 8.5 STRL (GLOVE) ×3 IMPLANT
GOWN PREVENTION PLUS XXLARGE (GOWN DISPOSABLE) ×3 IMPLANT
GOWN STRL NON-REIN LRG LVL3 (GOWN DISPOSABLE) ×6 IMPLANT
GOWN STRL REIN XL XLG (GOWN DISPOSABLE) ×6 IMPLANT
IV CATH 14GX2 1/4 (CATHETERS) IMPLANT
KIT BASIN OR (CUSTOM PROCEDURE TRAY) ×3 IMPLANT
KIT DILATOR XLIF 5 (KITS) IMPLANT
KIT MAXCESS (KITS) ×1 IMPLANT
KIT NDL NVM5 EMG ELECT (KITS) IMPLANT
KIT NEEDLE NVM5 EMG ELECT (KITS) ×2 IMPLANT
KIT NEEDLE NVM5 EMG ELECTRODE (KITS) ×1
KIT POSITION SURG JACKSON T1 (MISCELLANEOUS) ×2 IMPLANT
KIT ROOM TURNOVER OR (KITS) ×3 IMPLANT
KIT XLIF (KITS) ×1
NDL SPNL 18GX3.5 QUINCKE PK (NEEDLE) ×2 IMPLANT
NEEDLE 22X1 1/2 (OR ONLY) (NEEDLE) ×3 IMPLANT
NEEDLE SPNL 18GX3.5 QUINCKE PK (NEEDLE) ×3 IMPLANT
NS IRRIG 1000ML POUR BTL (IV SOLUTION) ×3 IMPLANT
PACK LAMINECTOMY ORTHO (CUSTOM PROCEDURE TRAY) ×3 IMPLANT
PACK UNIVERSAL I (CUSTOM PROCEDURE TRAY) ×4 IMPLANT
PAD ARMBOARD 7.5X6 YLW CONV (MISCELLANEOUS) ×6 IMPLANT
PATTIES SURGICAL .5 X.5 (GAUZE/BANDAGES/DRESSINGS) ×1 IMPLANT
PATTIES SURGICAL .5 X1 (DISPOSABLE) ×3 IMPLANT
PENCIL BUTTON HOLSTER BLD 10FT (ELECTRODE) ×1 IMPLANT
PLATE 2H DECADE 8MM (Plate) ×1 IMPLANT
SCREW 45MM (Screw) ×3 IMPLANT
SPONGE LAP 4X18 X RAY DECT (DISPOSABLE) ×6 IMPLANT
SPONGE SURGIFOAM ABS GEL 100 (HEMOSTASIS) ×3 IMPLANT
STAPLER VISISTAT 35W (STAPLE) ×3 IMPLANT
STRIP CLOSURE SKIN 1/2X4 (GAUZE/BANDAGES/DRESSINGS) IMPLANT
SURGIFLO TRUKIT (HEMOSTASIS) ×3 IMPLANT
SURGILUBE 2OZ TUBE FLIPTOP (MISCELLANEOUS) IMPLANT
SUT ETHILON 3 0 FSL (SUTURE) IMPLANT
SUT MNCRL AB 3-0 PS2 18 (SUTURE) ×9 IMPLANT
SUT PDS AB 1 CTX 36 (SUTURE) ×2 IMPLANT
SUT PROLENE 0 CT (SUTURE) ×2 IMPLANT
SUT PROLENE 2 0 CT2 30 (SUTURE) ×2 IMPLANT
SUT VIC AB 0 CT1 27 (SUTURE) ×3
SUT VIC AB 0 CT1 27XBRD ANBCTR (SUTURE) IMPLANT
SUT VIC AB 0 CTB1 27 (SUTURE) ×4 IMPLANT
SUT VIC AB 1 CT1 27 (SUTURE) ×12
SUT VIC AB 1 CT1 27XBRD ANBCTR (SUTURE) ×4 IMPLANT
SUT VIC AB 1 CTX 36 (SUTURE) ×6
SUT VIC AB 1 CTX36XBRD ANBCTR (SUTURE) ×4 IMPLANT
SUT VIC AB 2-0 CT1 18 (SUTURE) ×8 IMPLANT
SYR BULB IRRIGATION 50ML (SYRINGE) ×3 IMPLANT
SYR CONTROL 10ML LL (SYRINGE) ×3 IMPLANT
TAPE CLOTH 4X10 WHT NS (GAUZE/BANDAGES/DRESSINGS) ×6 IMPLANT
TOWEL OR 17X24 6PK STRL BLUE (TOWEL DISPOSABLE) ×3 IMPLANT
TOWEL OR 17X26 10 PK STRL BLUE (TOWEL DISPOSABLE) ×3 IMPLANT
TRAY FOLEY CATH 14FRSI W/METER (CATHETERS) ×3 IMPLANT
TRAY FOLEY CATH 16FRSI W/METER (SET/KITS/TRAYS/PACK) ×3 IMPLANT
WATER STERILE IRR 1000ML POUR (IV SOLUTION) ×2 IMPLANT
YANKAUER SUCT BULB TIP NO VENT (SUCTIONS) ×3 IMPLANT

## 2013-03-21 NOTE — Preoperative (Addendum)
Beta Blockers   Reason not to administer Beta Blockers:Not Applicable, Pt took Metoprolol this am 

## 2013-03-21 NOTE — Anesthesia Postprocedure Evaluation (Signed)
  Anesthesia Post-op Note  Patient: Lori Potts  Procedure(s) Performed: Procedure(s): ANTERIOR LATERAL LUMBAR FUSION 1 LEVEL/ XLIF L3-L4  (N/A) POSTERIOR L2-3 RIGHT FORAMINOTOMY (Right)  Patient Location: PACU  Anesthesia Type:General  Level of Consciousness: awake, alert  and oriented  Airway and Oxygen Therapy: Patient Spontanous Breathing and Patient connected to nasal cannula oxygen  Post-op Pain: mild  Post-op Assessment: Post-op Vital signs reviewed  Post-op Vital Signs: Reviewed  Complications: No apparent anesthesia complications

## 2013-03-21 NOTE — Progress Notes (Signed)
DR. Shon Baton spoke to patient moved legs

## 2013-03-21 NOTE — Progress Notes (Signed)
Pt feels sensation and has no numbness

## 2013-03-21 NOTE — Brief Op Note (Signed)
03/21/2013    5:32 PM  PATIENT:  Lori Potts  67 y.o. female  PRE-OPERATIVE DIAGNOSIS:  ADJACENT SEGMENT DISEASE L3-L4, RIGHT L2-L3 FORAMINAL DISC HERNIATION  POST-OPERATIVE DIAGNOSIS:  ADJACENT SEGMENT DISEASE L3-L4, RIGHT L2-L3 FORAMINAL DISC HERNIATION  PROCEDURE:  Procedure(s): ANTERIOR LATERAL LUMBAR FUSION 1 LEVEL/ XLIF L3-L4  (N/A) POSTERIOR L2-3 RIGHT FORAMINOTOMY (Right)  SURGEON:  Surgeon(s) and Role:    * Venita Lick, MD - Primary  PHYSICIAN ASSISTANT:   ASSISTANTS: none   ANESTHESIA:   general  EBL:  Total I/O In: 2900 [I.V.:2650; IV Piggyback:250] Out: 1225 [Urine:1000; Blood:225]  BLOOD ADMINISTERED:none  DRAINS: none   LOCAL MEDICATIONS USED:  MARCAINE     SPECIMEN:  No Specimen  DISPOSITION OF SPECIMEN:  N/A  COUNTS:  YES  TOURNIQUET:  * No tourniquets in log *  DICTATION: .Other Dictation: Dictation Number [id number]  PLAN OF CARE: Admit to inpatient   PATIENT DISPOSITION:  PACU - hemodynamically stable.

## 2013-03-21 NOTE — Anesthesia Preprocedure Evaluation (Addendum)
Anesthesia Evaluation  Patient identified by MRN, date of birth, ID band Patient awake    Reviewed: Allergy & Precautions, H&P , NPO status , Patient's Chart, lab work & pertinent test results, reviewed documented beta blocker date and time   Airway Mallampati: II TM Distance: >3 FB Neck ROM: Full    Dental  (+) Edentulous Upper and Edentulous Lower   Pulmonary former smoker,  breath sounds clear to auscultation        Cardiovascular hypertension, Pt. on medications and Pt. on home beta blockers Rhythm:Regular Rate:Normal     Neuro/Psych    GI/Hepatic GERD-  Medicated and Controlled,  Endo/Other  diabetes, Type 2, Oral Hypoglycemic Agents  Renal/GU      Musculoskeletal   Abdominal   Peds  Hematology   Anesthesia Other Findings   Reproductive/Obstetrics                         Anesthesia Physical Anesthesia Plan  ASA: II  Anesthesia Plan: General   Post-op Pain Management:    Induction: Intravenous  Airway Management Planned: Oral ETT  Additional Equipment:   Intra-op Plan:   Post-operative Plan: Extubation in OR  Informed Consent: I have reviewed the patients History and Physical, chart, labs and discussed the procedure including the risks, benefits and alternatives for the proposed anesthesia with the patient or authorized representative who has indicated his/her understanding and acceptance.     Plan Discussed with: CRNA, Anesthesiologist and Surgeon  Anesthesia Plan Comments: (Lumbar sponylosis htn GERD)       Anesthesia Quick Evaluation

## 2013-03-21 NOTE — Progress Notes (Signed)
Dr, Ivin Booty called for sign out

## 2013-03-21 NOTE — H&P (Signed)
No change in clinical exam H+P reviewed  

## 2013-03-21 NOTE — Anesthesia Procedure Notes (Signed)
Procedure Name: Intubation Date/Time: 03/21/2013 1:14 PM Performed by: Gwenyth Allegra Pre-anesthesia Checklist: Emergency Drugs available, Patient identified, Timeout performed, Suction available and Patient being monitored Patient Re-evaluated:Patient Re-evaluated prior to inductionOxygen Delivery Method: Circle system utilized Preoxygenation: Pre-oxygenation with 100% oxygen Intubation Type: IV induction Ventilation: Mask ventilation without difficulty Laryngoscope Size: Mac and 3 Grade View: Grade I Tube type: Oral Tube size: 7.5 mm Number of attempts: 1 Airway Equipment and Method: Stylet Placement Confirmation: ETT inserted through vocal cords under direct vision,  positive ETCO2 and breath sounds checked- equal and bilateral Secured at: 21 cm Tube secured with: Tape Dental Injury: Teeth and Oropharynx as per pre-operative assessment

## 2013-03-21 NOTE — Transfer of Care (Signed)
Immediate Anesthesia Transfer of Care Note  Patient: Lori Potts  Procedure(s) Performed: Procedure(s): ANTERIOR LATERAL LUMBAR FUSION 1 LEVEL/ XLIF L3-L4  (N/A) POSTERIOR L2-3 RIGHT FORAMINOTOMY (Right)  Patient Location: PACU  Anesthesia Type:General  Level of Consciousness: sedated, patient cooperative and responds to stimulation  Airway & Oxygen Therapy: Patient Spontanous Breathing and Patient connected to nasal cannula oxygen  Post-op Assessment: Report given to PACU RN, Post -op Vital signs reviewed and stable, Patient moving all extremities and Patient moving all extremities X 4  Post vital signs: Reviewed and stable  Complications: No apparent anesthesia complications

## 2013-03-21 NOTE — Progress Notes (Signed)
Dr. Ivin Booty called informed of blood sugar no orders received

## 2013-03-22 ENCOUNTER — Inpatient Hospital Stay (HOSPITAL_COMMUNITY): Payer: Managed Care, Other (non HMO)

## 2013-03-22 LAB — GLUCOSE, CAPILLARY
Glucose-Capillary: 141 mg/dL — ABNORMAL HIGH (ref 70–99)
Glucose-Capillary: 150 mg/dL — ABNORMAL HIGH (ref 70–99)
Glucose-Capillary: 151 mg/dL — ABNORMAL HIGH (ref 70–99)
Glucose-Capillary: 155 mg/dL — ABNORMAL HIGH (ref 70–99)
Glucose-Capillary: 181 mg/dL — ABNORMAL HIGH (ref 70–99)
Glucose-Capillary: 181 mg/dL — ABNORMAL HIGH (ref 70–99)

## 2013-03-22 LAB — HEMOGLOBIN A1C
Hgb A1c MFr Bld: 6.8 % — ABNORMAL HIGH (ref <5.7)
Mean Plasma Glucose: 148 mg/dL — ABNORMAL HIGH (ref <117)

## 2013-03-22 MED ORDER — POLYETHYLENE GLYCOL 3350 17 GM/SCOOP PO POWD: 17.0000 g | Freq: Every day | ORAL | Status: DC

## 2013-03-22 MED ORDER — METHOCARBAMOL 500 MG PO TABS: 500.0000 mg | ORAL_TABLET | Freq: Three times a day (TID) | ORAL | Status: DC | PRN

## 2013-03-22 MED ORDER — OXYCODONE-ACETAMINOPHEN 10-325 MG PO TABS: 1.0000 | ORAL_TABLET | ORAL | Status: DC | PRN

## 2013-03-22 NOTE — Evaluation (Signed)
Occupational Therapy Evaluation Patient Details Name: Lori Potts MRN: [id number] DOB: 19-Aug-1946 Today's Date: 03/22/2013 Time: 2440-1027 OT Time Calculation (min): 13 min  OT Assessment / Plan / Recommendation History of present illness Pt. admitted with spinal spenosis and degeneration, discogenic back pain.  she underwent anterolateral lumbar fusion 1 level XLIf L3-4, posterior L2-3 R foraminotomy.   Clinical Impression   Patient reports this is her third back surgery so does not feel she needs OT services while in the hospital and reports she has family to assist at home when she is discharged.  Patient also reports that she has all AE & DME needed to perform BADLs and reports no problems with toileting hygiene tasks given her back precautions.  No further acute OT needs identified. All education has been completed and the patient has no further questions.  OT is signing off. Thank you for this referral.    OT Assessment  Patient does not need any further OT services    Follow Up Recommendations  No OT follow up    Equipment Recommendations  None recommended by OT    Precautions / Restrictions Precautions Precautions: Back Precaution Booklet Issued: Yes per PT Precaution Comments: pt. educated on 3 back precautions and log rolling technique Restrictions Weight Bearing Restrictions: No   Pertinent Vitals/Pain Denies pain    Visit Information  Last OT Received On: 03/22/13 Assistance Needed: +1 Reason Eval/Treat Not Completed: OT screened, no needs identified, will sign off History of Present Illness: Pt. admitted with spinal spenosis and degeneration, discogenic back pain.  she underwent anterolateral lumbar fusion 1 level XLIf L3-4, posterior L2-3 R foraminotomy.       Prior Functioning     Home Living Family/patient expects to be discharged to:: Private residence Living Arrangements: Spouse/significant other Available Help at Discharge: Available 24  hours/day;Family Type of Home: House Home Access: Stairs to enter [employer] of Steps: 3 Entrance Stairs-Rails: Right;Left;Can reach both Home Layout: One level Home Equipment: Bedside commode;Shower seat Prior Function Level of Independence: Independent Communication Communication: No difficulties    End of Session OT - End of Session Patient left: in bed;with family/visitor present;with call bell/phone within reach  GO     Lori Potts 03/22/2013, 2:18 PM

## 2013-03-22 NOTE — Progress Notes (Signed)
    Subjective: Procedure(s) (LRB): ANTERIOR LATERAL LUMBAR FUSION 1 LEVEL/ XLIF L3-L4  (N/A) POSTERIOR L2-3 RIGHT FORAMINOTOMY (Right) 1 Day Post-Op  Patient reports pain as 3 on 0-10 scale.  Reports decreased leg pain reports incisional back pain   Positive void Negative bowel movement Positive flatus Negative chest pain or shortness of breath  Objective: Vital signs in last 24 hours: Temp:  [96.8 F (36 C)-98.2 F (36.8 C)] 97.8 F (36.6 C) (08/01 0503) Pulse Rate:  [56-77] 67 (08/01 0503) Resp:  [9-18] 16 (08/01 0503) BP: (122-157)/(44-67) 122/48 mmHg (08/01 0503) SpO2:  [96 %-100 %] 99 % (08/01 0503)  Intake/Output from previous day: 07/31 0701 - 08/01 0700 In: 3200 [I.V.:2900; IV Piggyback:300] Out: 2375 [Urine:2150; Blood:225]  Labs:  Recent Labs  03/20/13 1312  WBC 4.6  RBC 4.46  HCT 34.0*  PLT 312    Recent Labs  03/20/13 1312  NA 139  K 3.6  CL 102  CO2 29  BUN 12  CREATININE 0.66  GLUCOSE 147*  CALCIUM 10.0   No results found for this basename: LABPT, INR,  in the last 72 hours  Physical Exam: Neurologically intact ABD soft Intact pulses distally Incision: dressing C/D/I and no drainage Compartment soft  Assessment/Plan: Patient stable  xrays satisfactory Continue mobilization with physical therapy Continue care  Advance diet Up with therapy D/C IV fluids Plan for discharge tomorrow or Sunday - if cleared by PT  Venita Lick, MD [facility] [phone]

## 2013-03-22 NOTE — Op Note (Signed)
Lori Potts, Lori Potts NO.:  [id number]  MEDICAL RECORD NO.:  [id number]  LOCATION:  [id number]                        FACILITY:  [facility]  PHYSICIAN:  Alvy Beal, MD    DATE OF BIRTH:  16-Jan-1946  DATE OF PROCEDURE: DATE OF DISCHARGE:                              OPERATIVE REPORT   PREOPERATIVE DIAGNOSIS: 1. Lumbar spinal stenosis, L2-3 right side. 2. 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  POSTOPERATIVE DIAGNOSIS: 1. Lumbar spinal stenosis, L2-3 right side. 2. 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  OPERATIVE PROCEDURE: 1. Lateral interbody fusion with [employer] XLIF system utilizing an 8 x     21 cage packed with Osteocel. 2. Posterolateral instrumented fusion L3-4 with a lateral plate with     fixation into the L3 with a 45 mm screw at L4 with 55 mm screw. 3. Posterolateral decompression foraminotomy L2-3.  COMPLICATIONS:  None.  CONDITION:  Stable.  HISTORY:  This is a very pleasant woman, who has had severe debilitating back and right leg pain for sometime now.  She had a previous L4-5 and L5-S1 fusion, did well initially but over the last several years decompensated.  CT scan demonstrated adjacent segment degeneration that had progressed.  She had foraminal stenosis at L2-3.  As a result of the condition and failure to improve with conservative measures, we elected to proceed with surgery.  All appropriate risks, benefits, and alternatives were discussed with the patient.  Consent was obtained.  OPERATIVE NOTE:  The patient was brought to the operating room, placed supine on the operating table.  After successful induction of general anesthesia and endotracheal intubation, TEDs, SCDs, and Foley were inserted.  All appropriate needles were then placed for EMG monitoring during the XLIF procedure.  The patient returned to the right lateral decubitus position with the right side up.  A axillary roll and all  bony prominence was placed and all bony prominences were well padded.  The patient was taped down to the bed and we confirmed satisfactory position in the AP and lateral planes.  Once this was confirmed, we then prepped and draped the lateral aspect of the flank.  Time-out was done confirming the patient, procedure, and all other pertinent important data.  A marker was placed on the body and identified the posterior and anterior margins of the L3 disc.  I infiltrated the L3-4 disk.  I infiltrated the incision site and made an incision along the flank. Sharp dissection was carried out down through the deep adipose tissue to the fascia of the external oblique.  A second incision was made 1 fingerbreadth posteriorly, and I bluntly dissected down to the retroperitoneal space.  I then advanced through the retroperitoneal space with Kocher and then I was able to manually dissect in the retroperitoneal space.  I then bluntly dissected through the external oblique and then advanced my probe down to the surface of the transverse process.  I stimulated circumferentially to confirm that I was not violating the plexus and then advanced through the iliopsoas down to the lateral aspect of the L3-4 disk.  I confirmed position in both the AP and lateral planes.  A guidepin  was used to maintain my initial trocar and proper position.  I then sequentially dilated again stimulating circumferentially until I was at the final dilator.  Again, there was no free running EMGs to suggest damage to the lumbar plexus.  I then placed a retractor and secured it to the disk space with the trocar.  Once it was secured into the disk space, and I properly aligned and secured it to the table.  I now had excellent visualization of the lateral aspect of the L3-4 disk.  Again, I confirmed this in the AP and lateral planes. An annulotomy was performed with a #10 blade scalpel and then using a combination of pituitary  rongeurs, various curettes, and Kerrison rongeurs, I removed all the disk material.  I then used a Cobb elevator to remove the cartilaginous endplate, and released the contralateral anulus.  Once this was done, I continued using push-pull curettes to remove the disk space and have bleeding at subchondral bone.  Once this was done, I sequentially trialed and I noted the 8 x 21 trial had an excellent fit and provided adequate distraction.  I then obtained the actual implant packed it with Osteocel and malleted it to the appropriate depth.  I had an excellent fixation.  Because of the previous posterior pedicle screw fixation, I elected to use unilateral construct in order to create stability at the C3-4 level, I applied the plate and secured it down with the appropriate size screw.  I torqued them down according to the manufactures standard.  I had excellent purchase of both screws.  At this point, I irrigated the wound copiously with normal saline, obtained hemostasis using bipolar electrocautery, and then gently removed the retractor.  I then closed the fascia of the external oblique with a #1 Vicryl, and then a layer of 0 Vicryl, 2-0 Vicryl, and a 3-0 Monocryl.  I irrigated out the posterior incision that I had made and closed this in a similar fashion with 0 Vicryl suture, 2- 0 Vicryl suture, and 3-0 Monocryl.  At this point, with the lateral interbody fusion, and heart and fixation complete.  The patient was turned and the drapes were removed and she was placed supine on the operative table.  The flat Jackson table with the Andrey Campanile frame was then brought in and she was turned to the rotate it prone onto this table. All bony prominences well-padded and the back now was prepped and draped in a standard fashion.  Time-out was again taken confirming the patient, procedure, and all other pertinent important data.  I then placed 2 needles into the wound and confirmed the L2-L3 disk space  level.  I then made an incision centered over this disk space approximately 3 inches in length.  I dissected sharply down to the deep fascia, I released the deep fascia with cautery and then stripped the paraspinal muscles bilaterally to expose the posterior aspect of the L2 spinous process and lamina.  I then took an x-ray confirming that I was at the L2 lamina. Once this was done, I removed the majority of the L2 spinous process.  I then used a fine nerve hook to develop a plane underneath the lamina and performed a generous laminotomy on the right side of L2.  I then identified the central raphe and dissected into this to expose the underlying thecal sac.  I then removed the ligamentum flavum.  I had to complete my central decompression.  I then went into the lateral recess on  the right-hand side, until I could palpate the pedicle.  Once I was able to do this, I went to inferior and superior into the foramen removing the osteophyte and performed a foraminotomy and then going superiorly to ensure I had an adequate decompression.  I then took a final x-ray with spanning the L2-L3 disk space confirming an adequate decompression on the right side at L2-3.  Hemostasis was achieved using bipolar electrocautery.  I was able to easily pass my [facility] elevator superiorly and inferiorly, and (circumferentially indicating an adequate decompression).  Hemostasis was obtained.  I placed a thrombin-soaked Gelfoam over the laminotomy site.  I then closed the deep fascia with interrupted #1 Vicryl sutures, superficial 2-0 Vicryl sutures, and a 3-0 Monocryl for the skin.  Steri-Strips and a dry dressing were applied. At this point time, I had completed the case and the patient was ultimately extubated and transferred to the PACU without incident.  At the end of the case, all needle and sponge counts were correct.  There was no adverse intraoperative events.    Alvy Beal, MD    DDB/MEDQ  D:   03/21/2013  T:  03/22/2013  Job:  [id number]

## 2013-03-22 NOTE — Discharge Summary (Signed)
Patient ID: Lori Potts MRN: [id number] DOB/AGE: 09-19-1945 67 y.o.  Admit date: 03/21/2013 Discharge date: 03/22/2013  Admission Diagnoses:  Active Problems:   * No active hospital problems. *   Discharge Diagnoses:  Active Problems:   * No active hospital problems. *  status post Procedure(s): ANTERIOR LATERAL LUMBAR FUSION 1 LEVEL/ XLIF L3-L4  POSTERIOR L2-3 RIGHT FORAMINOTOMY  Past Medical History  Diagnosis Date  . Hypertension   . Diabetes mellitus without complication   . GERD (gastroesophageal reflux disease)   . Arthritis     Surgeries: Procedure(s): ANTERIOR LATERAL LUMBAR FUSION 1 LEVEL/ XLIF L3-L4  POSTERIOR L2-3 RIGHT FORAMINOTOMY on 03/21/2013   Consultants:  none   Discharged Condition: Improved  Hospital Course: Lori Potts is an 67 y.o. female who was admitted 03/21/2013 for operative treatment of <principal problem not specified>. Patient failed conservative treatments (please see the history and physical for the specifics) and had severe unremitting pain that affects sleep, daily activities and work/hobbies. After pre-op clearance, the patient was taken to the operating room on 03/21/2013 and underwent  Procedure(s): ANTERIOR LATERAL LUMBAR FUSION 1 LEVEL/ XLIF L3-L4  POSTERIOR L2-3 RIGHT FORAMINOTOMY.    Patient was given perioperative antibiotics: Anti-infectives   Start     Dose/Rate Route Frequency Ordered Stop   03/21/13 2200  ceFAZolin (ANCEF) IVPB 1 g/50 mL premix     1 g 100 mL/hr over 30 Minutes Intravenous Every 8 hours 03/21/13 1954 03/22/13 0716   03/20/13 1433  ceFAZolin (ANCEF) IVPB 2 g/50 mL premix     2 g 100 mL/hr over 30 Minutes Intravenous 30 min pre-op 03/20/13 1433 03/21/13 1614       Patient was given sequential compression devices and early ambulation to prevent DVT.   Patient benefited maximally from hospital stay and there were no complications. At the time of discharge, the patient was urinating/moving their  bowels without difficulty, tolerating a regular diet, pain is controlled with oral pain medications and they have been cleared by PT/OT.   Recent vital signs: Patient Vitals for the past 24 hrs:  BP Temp Temp src Pulse Resp SpO2  03/22/13 0503 122/48 mmHg 97.8 F (36.6 C) - 67 16 99 %  03/22/13 0443 - - - - 14 100 %  03/22/13 0203 126/55 mmHg 98.2 F (36.8 C) - 77 14 100 %  03/22/13 0111 - - - - 10 100 %  03/22/13 0008 - - - - 14 -  03/22/13 0000 - - - - 9 98 %  03/21/13 2109 141/66 mmHg 98.2 F (36.8 C) - 72 16 99 %  03/21/13 2000 - - - - 14 -  03/21/13 1925 133/53 mmHg 97.9 F (36.6 C) - 62 14 99 %  03/21/13 1900 157/63 mmHg 97 F (36.1 C) - 71 15 100 %  03/21/13 1845 156/59 mmHg - - 56 17 99 %  03/21/13 1830 138/67 mmHg - - 59 14 100 %  03/21/13 1815 147/44 mmHg - - 64 15 100 %  03/21/13 1803 143/67 mmHg - - - 15 96 %  03/21/13 1800 131/61 mmHg - - 66 16 98 %  03/21/13 1758 - 97.8 F (36.6 C) - - - -  03/21/13 1032 151/59 mmHg 96.8 F (36 C) Oral 60 18 98 %     Recent laboratory studies:  Recent Labs  03/20/13 1312  WBC 4.6  HGB 12.0  HCT 34.0*  PLT 312  NA 139  K 3.6  CL 102  CO2 29  BUN 12  CREATININE 0.66  GLUCOSE 147*  CALCIUM 10.0     Discharge Medications:     Medication List         atorvastatin 40 MG tablet  Commonly known as:  LIPITOR  Take 40 mg by mouth daily.     citalopram 20 MG tablet  Commonly known as:  CELEXA  Take 20 mg by mouth daily as needed. Will take for stressful days.     docusate sodium 100 MG capsule  Commonly known as:  COLACE  Take 100 mg by mouth 3 (three) times daily as needed for constipation.     esomeprazole 40 MG capsule  Commonly known as:  NEXIUM  Take 40 mg by mouth daily before breakfast.     methocarbamol 500 MG tablet  Commonly known as:  ROBAXIN  Take 1 tablet (500 mg total) by mouth 3 (three) times daily as needed.     metoprolol 50 MG tablet  Commonly known as:  LOPRESSOR  Take 25 mg by mouth 2  (two) times daily.     ondansetron 4 MG tablet  Commonly known as:  ZOFRAN  Take 4 mg by mouth every 8 (eight) hours as needed for nausea.     ONGLYZA 5 MG Tabs tablet  Generic drug:  saxagliptin HCl  Take 5 mg by mouth daily.     oxyCODONE-acetaminophen 10-325 MG per tablet  Commonly known as:  PERCOCET  Take 1 tablet by mouth every 4 (four) hours as needed for pain.     polyethylene glycol powder powder  Commonly known as:  GLYCOLAX  Take 17 g by mouth daily.     Vitamin D (Ergocalciferol) 50000 UNITS Caps  Commonly known as:  DRISDOL  Take 50,000 Units by mouth every 30 (thirty) days.        Diagnostic Studies: Dg Lumbar Spine 2-3 Views  03/22/2013   *RADIOLOGY REPORT*  Clinical Data: Postop lumbar fusion.  LUMBAR SPINE - 2-3 VIEW  Comparison: 03/21/2013  Findings: Vertebral body alignment and heights are within normal. There is mild spondylosis present.  Posterior fusion hardware with bilateral pedicle screws is unchanged from L4-S1 with prosthetic disc material at the intervening disc spaces.  There is a right lateral fusion plate with two associated screws over the L3-4 level.  This is unchanged.  Intervertebral cage is present at this disc space.  There is disc space narrowing at the L2-3 level.  A small caliber catheter is present over the posterior soft tissues with tip not completely visualized in the region of the T8-9 level likely a neural stimulator.  There are multiple surgical clips over the left upper quadrant.  Recommend correlation with findings at the time of the procedure.  Impression: Mild spondylosis of the lumbar spine with fusion hardware as described from L3-S1 unchanged.  Prosthetic disc material at the intervening disc spaces unchanged. Disc space narrowing at the L2-3 level unchanged.  Neural stimulator in place.   Original Report Authenticated By: Elberta Fortis, M.D.   Dg Lumbar Spine 2-3 Views  03/21/2013   *RADIOLOGY REPORT*  Clinical Data: Lumbar disc  protrusions.  DG C-ARM GT 120 MIN,LUMBAR SPINE - 2-3 VIEW  Comparison: CT myelogram dated 02/26/2013 and lumbar radiographs dated 03/20/2013  Findings: AP and lateral C-arm images demonstrate the patient has undergone interbody and lateral fusion at L3-4.  Hardware appears in good position in the AP and lateral projections.  IMPRESSION: Interbody fusion performed at L3-4.  Original Report Authenticated By: Francene Boyers, M.D.   Dg Lumbar Spine 2-3 Views  03/20/2013   *RADIOLOGY REPORT*  Clinical Data: Preop fusion  LUMBAR SPINE - 2-3 VIEW  Comparison: None.  Findings: Transpedicular screws with connecting rods from L4-S1 with interbody fusion material at L4-5 and L5 S1.  Severe L3-4 degenerative disc disease.  Moderate L2-3 degenerative disc disease.  Intrathecal electrodes noted. Mild to moderate bilateral sacroiliac degenerative change.  IMPRESSION: Postoperative and degenerative changes.   Original Report Authenticated By: Esperanza Heir, M.D.   Ct Lumbar Spine W Contrast  02/26/2013   *RADIOLOGY REPORT*  Clinical Data:  Recurrent low back pain extending into the right lower extremity.  The patient resolve following the most recent spine surgery, but has recurred.  MYELOGRAM INJECTION  Technique:  Informed consent was obtained from the patient prior to the procedure, including potential complications of headache, allergy, infection and pain.  A timeout procedure was performed. With the patient prone, the lower back was prepped with Betadine. 1% Lidocaine was used for local anesthesia.  Lumbar puncture was performed at the left paramidline L2-3 level using a 22 gauge needle with return of clear CSF.  12 ml of Omnipaque 180was injected into the subarachnoid space .  IMPRESSION: Successful injection of  intrathecal contrast for myelography.  MYELOGRAM LUMBAR  Technique: I personally performed the lumbar puncture and administered the intrathecal contrast. I also personally supervised acquisition of the  myelogram images. Following injection of intrathecal Omnipaque contrast, spine imaging in multiple projections was performed using fluoroscopy.  Fluoroscopy Time: 54 seconds  Comparison:  Multiple prior lumbar myelograms, most recently 12/08/2011.  Findings: The patient is fused at L4-5 and L5-S1.  Adjacent level disease at L3-4 demonstrates progressive left lateral recess stenosis.  Right lateral recess narrowing is more mild, unchanged from the prior study.  A mild broad-based disc herniation at L2-3 is not significantly changed.  No significant central canal stenosis is present.  The nerve roots fill normally through the fused segments.  No residual or recurrent stenosis is evident.  Hardware is intact. There is no significant change in alignment or disc herniations upon standing.  The retrolisthesis is exaggerated and extension.  IMPRESSION:  1.  Continued progression of adjacent level disease at L3-4 with left greater than right lateral recess narrowing. 2.  Slight retrolisthesis at L3-4 is exaggerated and extension. 3.  Stable broad-based disc herniation at L2-3 without significant central canal stenosis. 4.  Stable fusion at L4-5 and L5-S1 without significant residual or recurrent stenosis.  *RADIOLOGY REPORT*  CT MYELOGRAPHY LUMBAR SPINE  Technique:  CT imaging of the lumbar spine was performed after intrathecal contrast administration.  Multiplanar CT image reconstructions were also generated.  Findings:  The lumbar spine is imaged from the midbody of T12 through S2-3.  Minimal retrolisthesis is present at L3-4. Alignment is otherwise anatomic.  Mild leftward curvature is present in the upper lumbar spine.  Slight rightward curvature is present at L3-4 with asymmetric disc disease on the left at L3-4.  The patient is status post left nephrectomy.  Atherosclerotic calcifications are again noted in the distal aorta without aneurysm.  Degenerative changes at the SI joints bilaterally are similar to the prior  study.  L1-2:  Minimal facet hypertrophy is stable.  There is no significant stenosis.  A L2-3:  A right lateral disc protrusion is present as.  This extends into the right neural foramen.  Mild right to foraminal stenosis is similar to the prior exam.  Facet hypertrophy and ligamentum flavum thickening contribute.  The central canal and left foramen are patent.  L3-4:  A left lateral disc protrusion has progressed.  Mild facet hypertrophy is similar to the prior exam.  Mild left lateral recess narrowing is slightly worse.  Moderate left foraminal stenosis has progressed.  L4-5:  The disc space demonstrates solid fusion.  The patient is status post laminectomy.  No residual or recurrent stenosis is present.  L5-S1:  The disc spaces fused.  The patient is status post laminectomy.  No residual or recurrent stenosis is present.  IMPRESSION:  1.  Progressive leftward L3-4 disc protrusion with slight worsening of mild left lateral recess and moderate left foraminal stenosis. 2.  Stable fusion at L4-5 and L5-S1 without evidence for residual or recurrent stenosis. 3.  Stable right lateral disc protrusion at to L2-3 with mild right lateral recess narrowing. 4.  Status post left nephrectomy. 5.  Stable degenerative changes of the SI joints bilaterally.   Original Report Authenticated By: Marin Roberts, M.D.   Dg Myelogram Lumbar  02/26/2013   *RADIOLOGY REPORT*  Clinical Data:  Recurrent low back pain extending into the right lower extremity.  The patient resolve following the most recent spine surgery, but has recurred.  MYELOGRAM INJECTION  Technique:  Informed consent was obtained from the patient prior to the procedure, including potential complications of headache, allergy, infection and pain.  A timeout procedure was performed. With the patient prone, the lower back was prepped with Betadine. 1% Lidocaine was used for local anesthesia.  Lumbar puncture was performed at the left paramidline L2-3 level using a 22  gauge needle with return of clear CSF.  12 ml of Omnipaque 180was injected into the subarachnoid space .  IMPRESSION: Successful injection of  intrathecal contrast for myelography.  MYELOGRAM LUMBAR  Technique: I personally performed the lumbar puncture and administered the intrathecal contrast. I also personally supervised acquisition of the myelogram images. Following injection of intrathecal Omnipaque contrast, spine imaging in multiple projections was performed using fluoroscopy.  Fluoroscopy Time: 54 seconds  Comparison:  Multiple prior lumbar myelograms, most recently 12/08/2011.  Findings: The patient is fused at L4-5 and L5-S1.  Adjacent level disease at L3-4 demonstrates progressive left lateral recess stenosis.  Right lateral recess narrowing is more mild, unchanged from the prior study.  A mild broad-based disc herniation at L2-3 is not significantly changed.  No significant central canal stenosis is present.  The nerve roots fill normally through the fused segments.  No residual or recurrent stenosis is evident.  Hardware is intact. There is no significant change in alignment or disc herniations upon standing.  The retrolisthesis is exaggerated and extension.  IMPRESSION:  1.  Continued progression of adjacent level disease at L3-4 with left greater than right lateral recess narrowing. 2.  Slight retrolisthesis at L3-4 is exaggerated and extension. 3.  Stable broad-based disc herniation at L2-3 without significant central canal stenosis. 4.  Stable fusion at L4-5 and L5-S1 without significant residual or recurrent stenosis.  *RADIOLOGY REPORT*  CT MYELOGRAPHY LUMBAR SPINE  Technique:  CT imaging of the lumbar spine was performed after intrathecal contrast administration.  Multiplanar CT image reconstructions were also generated.  Findings:  The lumbar spine is imaged from the midbody of T12 through S2-3.  Minimal retrolisthesis is present at L3-4. Alignment is otherwise anatomic.  Mild leftward curvature  is present in the upper lumbar spine.  Slight rightward curvature is present at L3-4 with asymmetric disc disease on  the left at L3-4.  The patient is status post left nephrectomy.  Atherosclerotic calcifications are again noted in the distal aorta without aneurysm.  Degenerative changes at the SI joints bilaterally are similar to the prior study.  L1-2:  Minimal facet hypertrophy is stable.  There is no significant stenosis.  A L2-3:  A right lateral disc protrusion is present as.  This extends into the right neural foramen.  Mild right to foraminal stenosis is similar to the prior exam.  Facet hypertrophy and ligamentum flavum thickening contribute.  The central canal and left foramen are patent.  L3-4:  A left lateral disc protrusion has progressed.  Mild facet hypertrophy is similar to the prior exam.  Mild left lateral recess narrowing is slightly worse.  Moderate left foraminal stenosis has progressed.  L4-5:  The disc space demonstrates solid fusion.  The patient is status post laminectomy.  No residual or recurrent stenosis is present.  L5-S1:  The disc spaces fused.  The patient is status post laminectomy.  No residual or recurrent stenosis is present.  IMPRESSION:  1.  Progressive leftward L3-4 disc protrusion with slight worsening of mild left lateral recess and moderate left foraminal stenosis. 2.  Stable fusion at L4-5 and L5-S1 without evidence for residual or recurrent stenosis. 3.  Stable right lateral disc protrusion at to L2-3 with mild right lateral recess narrowing. 4.  Status post left nephrectomy. 5.  Stable degenerative changes of the SI joints bilaterally.   Original Report Authenticated By: Marin Roberts, M.D.   Dg Lumbar Spine 1 View  03/21/2013   *RADIOLOGY REPORT*  Clinical Data: L2-3 lumbar decompression.  LUMBAR SPINE - 1 VIEW  Comparison: Lumbar spine radiographs obtained yesterday.  Findings: Metallic localizer with its tip projected over the posterior aspect of the L2  vertebral body inferiorly.  Second metallic localizer with its tip projected over the posterior aspect of the L3 vertebral body inferiorly.  Interval screw and plate fixation at the L3-4 level.  Stable pedicle screw and rod fixation at the L4-S1 levels.  Surgical clips are again demonstrated overlying the upper lumbar and lower thoracic spine and mild to moderate anterior spur formation is again demonstrated at multiple levels.  IMPRESSION: Localizers at the L2 and L3 levels.   Original Report Authenticated By: Beckie Salts, M.D.   Dg C-arm Gt 120 Min  03/21/2013   *RADIOLOGY REPORT*  Clinical Data: Lumbar disc protrusions.  DG C-ARM GT 120 MIN,LUMBAR SPINE - 2-3 VIEW  Comparison: CT myelogram dated 02/26/2013 and lumbar radiographs dated 03/20/2013  Findings: AP and lateral C-arm images demonstrate the patient has undergone interbody and lateral fusion at L3-4.  Hardware appears in good position in the AP and lateral projections.  IMPRESSION: Interbody fusion performed at L3-4.   Original Report Authenticated By: Francene Boyers, M.D.          Follow-up Information   Follow up with Alvy Beal, MD. Schedule an appointment as soon as possible for a visit in 2 weeks.   Contact information:   [address] [place] [address] [phone]       Discharge Plan:  discharge to home  Disposition: stable    Signed: Venita Lick D for Dr. Venita Lick [facility] [phone] 03/22/2013, 7:48 AM

## 2013-03-22 NOTE — Evaluation (Signed)
Physical Therapy Evaluation Patient Details Name: Lori Potts MRN: [id number] DOB: [birth year] Today's Date: 03/22/2013 Time: 4098-1191 PT Time Calculation (min): 29 min  PT Assessment / Plan / Recommendation History of Present Illness  Pt. admitted with spinal spenosis and degeneration, discogenic back pain.  She underwent anterolateral lumbar fusion 1 level XLIf L3-4, posterior L2-3 R foraminotomy.  Clinical Impression  Pt. Presents to PT with some impulsivity on first attempt out of bed  . She has decrease in her usual  independence of functional mobility and gait and needs acute PT to address these and below issues to prepare her for DC home with her family.  She made good gains today, and expect rapid progress.     PT Assessment  Patient needs continued PT services    Follow Up Recommendations  No PT follow up;Supervision/Assistance - 24 hour;Supervision for mobility/OOB    Does the patient have the potential to tolerate intense rehabilitation      Barriers to Discharge        Equipment Recommendations  Rolling walker with 5" wheels    Recommendations for Other Services     Frequency Min 6X/week    Precautions / Restrictions Precautions Precautions: Back Precaution Booklet Issued: Yes (comment) Precaution Comments: pt. educated on 3 back precautions and log rolling technique Restrictions Weight Bearing Restrictions: No   Pertinent Vitals/Pain See vitals tab       Mobility  Bed Mobility Bed Mobility: Rolling Left;Left Sidelying to Sit;Sitting - Scoot to Edge of Bed Rolling Left: 6: Modified independent (Device/Increase time) Left Sidelying to Sit: 4: Min assist Sitting - Scoot to Edge of Bed: 6: Modified independent (Device/Increase time) Details for Bed Mobility Assistance: cues for observing back precautions and technique Transfers Transfers: Sit to Stand;Stand to Sit Sit to Stand: 4: Min assist;With upper extremity assist;From bed Stand to Sit: 4: Min  assist;To chair/3-in-1;With upper extremity assist;With armrests Details for Transfer Assistance: cues for hand placment and technique; pt. with some impulsivity noted upon first standing as she tried to begin walking immediately before lines and tubes managed Ambulation/Gait Ambulation/Gait Assistance: 4: Min assist Ambulation Distance (Feet): 100 Feet Assistive device: Rolling walker Ambulation/Gait Assistance Details: cues for safe technique with RW, min assist for safety and stability ; cues for upright posture Gait Pattern: Step-through pattern;Trunk flexed Stairs: No    Exercises     PT Diagnosis: Difficulty walking;Acute pain  PT Problem List: Decreased activity tolerance;Decreased mobility;Decreased knowledge of use of DME;Decreased knowledge of precautions;Pain PT Treatment Interventions: DME instruction;Gait training;Stair training;Functional mobility training;Patient/family education     PT Goals(Current goals can be found in the care plan section) Acute Rehab PT Goals Patient Stated Goal: pt. wants to return to cooking for her family and taking care of herself PT Goal Formulation: With patient Time For Goal Achievement: 03/29/13 Potential to Achieve Goals: Good  Visit Information  Last PT Received On: 03/22/13 Assistance Needed: +1 History of Present Illness: Pt. admitted with spinal spenosis and degeneration, discogenic back pain.  she underwent anterolateral lumbar fusion 1 level XLIf L3-4, posterior L2-3 R foraminotomy.       Prior Functioning  Home Living Family/patient expects to be discharged to:: Private residence Living Arrangements: Spouse/significant other Available Help at Discharge: Available 24 hours/day;Family Type of Home: House Home Access: Stairs to enter [employer] of Steps: 3 Entrance Stairs-Rails: Right;Left;Can reach both Home Layout: One level Home Equipment: Bedside commode Prior Function Level of Independence:  Independent Communication Communication: No difficulties  Cognition  Cognition Arousal/Alertness: Awake/alert Behavior During Therapy: WFL for tasks assessed/performed Overall Cognitive Status: Within Functional Limits for tasks assessed    Extremity/Trunk Assessment Upper Extremity Assessment Upper Extremity Assessment: Overall WFL for tasks assessed Lower Extremity Assessment Lower Extremity Assessment: Overall WFL for tasks assessed   Balance Balance Balance Assessed: Yes Dynamic Standing Balance Dynamic Standing - Balance Support: No upper extremity supported Dynamic Standing - Level of Assistance: 5: Stand by assistance  End of Session PT - End of Session Equipment Utilized During Treatment: Gait belt Activity Tolerance: Patient tolerated treatment well Patient left: in chair;with call bell/phone within reach Nurse Communication: Mobility status  GP     Ferman Hamming 03/22/2013, 1:32 PM Weldon Picking PT Acute Rehab Services [phone] [place] [phone]

## 2013-03-22 NOTE — Progress Notes (Signed)
UR COMPLETED  

## 2013-03-22 NOTE — Progress Notes (Signed)
03/22/13 PT and OT evals-no follow up recommended, did recommend rolling walker. Spoke with patient, she is agreeable to rolling walker. Order placed.  Jacquelynn Cree RN, BSN, CCM

## 2013-03-23 DIAGNOSIS — M549 Dorsalgia, unspecified: Secondary | ICD-10-CM | POA: Diagnosis not present

## 2013-03-23 DIAGNOSIS — M48061 Spinal stenosis, lumbar region without neurogenic claudication: Secondary | ICD-10-CM | POA: Diagnosis not present

## 2013-03-23 LAB — GLUCOSE, CAPILLARY
Glucose-Capillary: 126 mg/dL — ABNORMAL HIGH (ref 70–99)
Glucose-Capillary: 148 mg/dL — ABNORMAL HIGH (ref 70–99)
Glucose-Capillary: 183 mg/dL — ABNORMAL HIGH (ref 70–99)

## 2013-03-23 MED ORDER — ONDANSETRON HCL 4 MG PO TABS: 4.0000 mg | ORAL_TABLET | Freq: Three times a day (TID) | ORAL | Status: DC | PRN

## 2013-03-23 MED ORDER — POLYETHYLENE GLYCOL 3350 17 GM/SCOOP PO POWD: 17.0000 g | Freq: Every day | ORAL | Status: DC

## 2013-03-23 MED ORDER — OXYCODONE HCL 10 MG PO TABS: 10.0000 mg | ORAL_TABLET | Freq: Four times a day (QID) | ORAL | Status: DC | PRN

## 2013-03-23 MED ORDER — METHOCARBAMOL 500 MG PO TABS: 500.0000 mg | ORAL_TABLET | Freq: Three times a day (TID) | ORAL | Status: DC | PRN

## 2013-03-23 MED ORDER — DOCUSATE SODIUM 100 MG PO CAPS: 100.0000 mg | ORAL_CAPSULE | Freq: Three times a day (TID) | ORAL | Status: DC | PRN

## 2013-03-23 NOTE — Progress Notes (Signed)
   CARE MANAGEMENT NOTE 03/23/2013  Patient:  Lori Potts, Lori Potts   Account Number:  [id number]  Date Initiated:  03/22/2013  Documentation initiated by:  [facility]  Subjective/Objective Assessment:   admitted postop lumbar fusion     Action/Plan:   Pt/Ot evals- no follow up recommended   Anticipated DC Date:  03/23/2013   Anticipated DC Plan:  HOME/SELF CARE      DC Planning Services  CM consult      Choice offered to / List presented to:             Status of service:  Completed, signed off Medicare Important Message given?   (If response is "NO", the following Medicare IM given date fields will be blank) Date Medicare IM given:   Date Additional Medicare IM given:    Discharge Disposition:  HOME/SELF CARE  Per UR Regulation:    If discussed at Long Length of Stay Meetings, dates discussed:    Comments:  03/23/2013 1700 NCM contacted [facility] for RW. No PT/OT follow up needed. Isidoro Donning RN CCM Case Mgmt phone [phone]  03/22/13 PT and OT evals-no follow up recommended, did recommend rolling walker. Spoke with patient, she is agreeable to rolling walker. Order placed.  Jacquelynn Cree RN, BSN, CCM

## 2013-03-23 NOTE — Progress Notes (Signed)
Physical Therapy Treatment Patient Details Name: Lori Potts MRN: [id number] DOB: [birth year] Today's Date: 03/23/2013 Time: 8295-6213 PT Time Calculation (min): 10 min  PT Assessment / Plan / Recommendation  History of Present Illness Pt. admitted with spinal spenosis and degeneration, discogenic back pain.  she underwent anterolateral lumbar fusion 1 level XLIf L3-4, posterior L2-3 R foraminotomy.   PT Comments   Progressing well. Ambulating hallways. Completed stair training  Follow Up Recommendations  No PT follow up;Supervision/Assistance - 24 hour;Supervision for mobility/OOB     Does the patient have the potential to tolerate intense rehabilitation     Barriers to Discharge        Equipment Recommendations       Recommendations for Other Services    Frequency Min 6X/week   Progress towards PT Goals Progress towards PT goals: Progressing toward goals  Plan Current plan remains appropriate    Precautions / Restrictions Precautions Precautions: Back Precaution Comments: pt. educated on 3 back precautions and log rolling technique Restrictions Weight Bearing Restrictions: No   Pertinent Vitals/Pain no apparent distress     Mobility  Bed Mobility Bed Mobility: Not assessed Transfers Sit to Stand: 7: Independent Stand to Sit: 7: Independent Ambulation/Gait Ambulation/Gait Assistance: 7: Independent Ambulation Distance (Feet): 1000 Feet Assistive device: None Gait Pattern: Within Functional Limits Stairs: Yes Stairs Assistance: 6: Modified independent (Device/Increase time) Stair Management Technique: Two rails Number of Stairs: 5    Exercises     PT Diagnosis:    PT Problem List:   PT Treatment Interventions:     PT Goals (current goals can now be found in the care plan section)    Visit Information  Last PT Received On: 03/23/13 Assistance Needed: +1 History of Present Illness: Pt. admitted with spinal spenosis and degeneration, discogenic back  pain.  she underwent anterolateral lumbar fusion 1 level XLIf L3-4, posterior L2-3 R foraminotomy.    Subjective Data      Cognition  Cognition Arousal/Alertness: Awake/alert Behavior During Therapy: WFL for tasks assessed/performed Overall Cognitive Status: Within Functional Limits for tasks assessed    Balance     End of Session PT - End of Session Activity Tolerance: Patient tolerated treatment well Patient left: in bed   GP     Deundra Furber, Adline Potter 03/23/2013, 10:10 AM 03/23/2013 Fredrich Birks PTA [phone] pager [phone] office

## 2013-03-23 NOTE — Progress Notes (Signed)
    Subjective: Procedure(s) (LRB): ANTERIOR LATERAL LUMBAR FUSION 1 LEVEL/ XLIF L3-L4  (N/A) POSTERIOR L2-3 RIGHT FORAMINOTOMY (Right) 2 Days Post-Op  Patient reports pain as 2 on 0-10 scale.  Reports decreased leg pain reports incisional back pain   Positive void Positive bowel movement Positive flatus Negative chest pain or shortness of breath  Objective: Vital signs in last 24 hours: Temp:  [98.4 F (36.9 C)-98.9 F (37.2 C)] 98.9 F (37.2 C) (08/02 0538) Pulse Rate:  [69-80] 80 (08/02 0538) Resp:  [16] 16 (08/02 0538) BP: (125-134)/(56-69) 128/69 mmHg (08/02 0538) SpO2:  [96 %-97 %] 96 % (08/02 0538)  Intake/Output from previous day: 08/01 0701 - 08/02 0700 In: 1520 [P.O.:840; I.V.:680] Out: -   Labs:  Recent Labs  03/20/13 1312  WBC 4.6  RBC 4.46  HCT 34.0*  PLT 312    Recent Labs  03/20/13 1312  NA 139  K 3.6  CL 102  CO2 29  BUN 12  CREATININE 0.66  GLUCOSE 147*  CALCIUM 10.0   No results found for this basename: LABPT, INR,  in the last 72 hours  Physical Exam: Neurologically intact ABD soft Neurovascular intact Intact pulses distally Incision: dressing C/D/I and no drainage Compartment soft  Assessment/Plan: Patient stable  xrays satisfactory Continue mobilization with physical therapy Continue care  Advance diet Up with therapy Discharge home with home health  Venita Lick, MD [facility] [phone]

## 2013-03-26 ENCOUNTER — Encounter (HOSPITAL_COMMUNITY): Payer: Self-pay | Admitting: Orthopedic Surgery

## 2013-03-26 NOTE — Progress Notes (Signed)
Late Entry: SW received a consult for possible placement. PT At this time is recommending home with HH and not SNF. CSW will make CM aware. Clinical Social Worker will sign off for now as social work intervention is no longer needed. Please consult us again if new need arises.  Dino Borntreger, MSW  [phone]  

## 2013-04-30 ENCOUNTER — Other Ambulatory Visit (HOSPITAL_COMMUNITY): Payer: Self-pay | Admitting: Orthopedic Surgery

## 2013-04-30 ENCOUNTER — Ambulatory Visit (HOSPITAL_COMMUNITY)
Admission: RE | Admit: 2013-04-30 | Discharge: 2013-04-30 | Disposition: A | Discharge status: Home / Self Care | Payer: Managed Care, Other (non HMO) | Source: Ambulatory Visit | Attending: Cardiovascular Disease | Admitting: Cardiovascular Disease

## 2013-04-30 DIAGNOSIS — I82401 Acute embolism and thrombosis of unspecified deep veins of right lower extremity: Secondary | ICD-10-CM

## 2013-04-30 DIAGNOSIS — I82409 Acute embolism and thrombosis of unspecified deep veins of unspecified lower extremity: Secondary | ICD-10-CM

## 2013-04-30 NOTE — Progress Notes (Signed)
Right Lower Extremity Venous Duplex Completed. Negative. °Brianna L Mazza,RVT °

## 2013-08-11 ENCOUNTER — Ambulatory Visit: Payer: Self-pay | Admitting: Physician Assistant

## 2013-09-26 ENCOUNTER — Ambulatory Visit: Payer: Self-pay

## 2013-09-26 ENCOUNTER — Ambulatory Visit: Payer: Self-pay | Admitting: Family Medicine

## 2013-10-28 ENCOUNTER — Ambulatory Visit: Payer: Self-pay | Admitting: Physician Assistant

## 2013-11-26 ENCOUNTER — Ambulatory Visit: Payer: Self-pay | Admitting: Family Medicine

## 2013-11-26 LAB — COMPREHENSIVE METABOLIC PANEL
Albumin: 3.8 g/dL (ref 3.4–5.0)
Alkaline Phosphatase: 114 U/L
Anion Gap: 9 (ref 7–16)
BUN: 23 mg/dL — ABNORMAL HIGH (ref 7–18)
Bilirubin,Total: 0.4 mg/dL (ref 0.2–1.0)
Calcium, Total: 9.5 mg/dL (ref 8.5–10.1)
Chloride: 98 mmol/L (ref 98–107)
Co2: 31 mmol/L (ref 21–32)
Creatinine: 0.98 mg/dL (ref 0.60–1.30)
EGFR (African American): >60
EGFR (Non-African Amer.): 60 — ABNORMAL LOW
Glucose: 236 mg/dL — ABNORMAL HIGH (ref 65–99)
Osmolality: 287 (ref 275–301)
Potassium: 3.3 mmol/L — ABNORMAL LOW (ref 3.5–5.1)
SGOT(AST): 15 U/L (ref 15–37)
SGPT (ALT): 26 U/L (ref 12–78)
Sodium: 138 mmol/L (ref 136–145)
Total Protein: 7.7 g/dL (ref 6.4–8.2)

## 2013-11-26 LAB — CBC WITH DIFFERENTIAL/PLATELET
Basophil #: 0 10*3/uL (ref 0.0–0.1)
Basophil %: 0.5 %
Eosinophil #: 0.1 10*3/uL (ref 0.0–0.7)
Eosinophil %: 2.3 %
HCT: 37.7 % (ref 35.0–47.0)
HGB: 12.6 g/dL (ref 12.0–16.0)
Lymphocyte #: 1.4 10*3/uL (ref 1.0–3.6)
Lymphocyte %: 25.9 %
MCH: 26.1 pg (ref 26.0–34.0)
MCHC: 33.5 g/dL (ref 32.0–36.0)
MCV: 78 fL — ABNORMAL LOW (ref 80–100)
Monocyte #: 0.4 x10 3/mm (ref 0.2–0.9)
Monocyte %: 7.4 %
Neutrophil #: 3.5 10*3/uL (ref 1.4–6.5)
Neutrophil %: 63.9 %
Platelet: 305 10*3/uL (ref 150–440)
RBC: 4.84 10*6/uL (ref 3.80–5.20)
RDW: 14.8 % — ABNORMAL HIGH (ref 11.5–14.5)
WBC: 5.5 10*3/uL (ref 3.6–11.0)

## 2013-11-29 ENCOUNTER — Other Ambulatory Visit: Payer: Self-pay | Admitting: Physical Medicine and Rehabilitation

## 2013-11-29 DIAGNOSIS — M542 Cervicalgia: Secondary | ICD-10-CM

## 2013-12-03 ENCOUNTER — Ambulatory Visit
Admission: RE | Admit: 2013-12-03 | Discharge: 2013-12-03 | Disposition: A | Discharge status: Home / Self Care | Payer: Medicare Other | Source: Ambulatory Visit | Attending: Physical Medicine and Rehabilitation | Admitting: Physical Medicine and Rehabilitation

## 2013-12-03 DIAGNOSIS — M542 Cervicalgia: Secondary | ICD-10-CM

## 2013-12-19 ENCOUNTER — Ambulatory Visit: Payer: Self-pay

## 2014-01-01 ENCOUNTER — Other Ambulatory Visit: Payer: Self-pay | Admitting: Orthopedic Surgery

## 2014-01-01 DIAGNOSIS — M542 Cervicalgia: Secondary | ICD-10-CM

## 2014-01-14 ENCOUNTER — Ambulatory Visit
Admission: RE | Admit: 2014-01-14 | Discharge: 2014-01-14 | Disposition: A | Discharge status: Home / Self Care | Payer: Managed Care, Other (non HMO) | Source: Ambulatory Visit | Attending: Orthopedic Surgery | Admitting: Orthopedic Surgery

## 2014-01-14 DIAGNOSIS — M542 Cervicalgia: Secondary | ICD-10-CM

## 2014-01-14 MED ORDER — IOHEXOL 180 MG/ML  SOLN
12.0000 mL | Freq: Once | INTRAMUSCULAR | Status: AC | PRN
  Administered 2014-01-14: 12 mL via INTRA_ARTICULAR

## 2014-02-06 ENCOUNTER — Ambulatory Visit: Payer: Self-pay | Admitting: Emergency Medicine

## 2014-03-16 ENCOUNTER — Ambulatory Visit: Payer: Self-pay | Admitting: Internal Medicine

## 2014-08-26 DIAGNOSIS — J209 Acute bronchitis, unspecified: Secondary | ICD-10-CM | POA: Diagnosis not present

## 2014-08-26 DIAGNOSIS — J019 Acute sinusitis, unspecified: Secondary | ICD-10-CM | POA: Diagnosis not present

## 2014-08-26 DIAGNOSIS — F419 Anxiety disorder, unspecified: Secondary | ICD-10-CM | POA: Diagnosis not present

## 2014-08-26 DIAGNOSIS — E780 Pure hypercholesterolemia: Secondary | ICD-10-CM | POA: Diagnosis not present

## 2014-08-26 DIAGNOSIS — K219 Gastro-esophageal reflux disease without esophagitis: Secondary | ICD-10-CM | POA: Diagnosis not present

## 2014-08-26 DIAGNOSIS — J449 Chronic obstructive pulmonary disease, unspecified: Secondary | ICD-10-CM | POA: Diagnosis not present

## 2014-08-26 DIAGNOSIS — Z01818 Encounter for other preprocedural examination: Secondary | ICD-10-CM | POA: Diagnosis not present

## 2014-08-26 DIAGNOSIS — E1165 Type 2 diabetes mellitus with hyperglycemia: Secondary | ICD-10-CM | POA: Diagnosis not present

## 2014-08-26 DIAGNOSIS — F411 Generalized anxiety disorder: Secondary | ICD-10-CM | POA: Diagnosis not present

## 2014-08-26 DIAGNOSIS — I10 Essential (primary) hypertension: Secondary | ICD-10-CM | POA: Diagnosis not present

## 2014-08-26 DIAGNOSIS — M940 Chondrocostal junction syndrome [Tietze]: Secondary | ICD-10-CM | POA: Diagnosis not present

## 2014-08-28 DIAGNOSIS — Z01818 Encounter for other preprocedural examination: Secondary | ICD-10-CM | POA: Diagnosis not present

## 2014-08-28 DIAGNOSIS — Z01810 Encounter for preprocedural cardiovascular examination: Secondary | ICD-10-CM | POA: Diagnosis not present

## 2014-08-28 DIAGNOSIS — G43109 Migraine with aura, not intractable, without status migrainosus: Secondary | ICD-10-CM | POA: Diagnosis not present

## 2014-09-01 DIAGNOSIS — E119 Type 2 diabetes mellitus without complications: Secondary | ICD-10-CM | POA: Diagnosis not present

## 2014-09-01 DIAGNOSIS — F329 Major depressive disorder, single episode, unspecified: Secondary | ICD-10-CM | POA: Diagnosis not present

## 2014-09-01 DIAGNOSIS — F411 Generalized anxiety disorder: Secondary | ICD-10-CM | POA: Diagnosis not present

## 2014-09-03 DIAGNOSIS — M542 Cervicalgia: Secondary | ICD-10-CM | POA: Diagnosis not present

## 2014-09-03 DIAGNOSIS — M961 Postlaminectomy syndrome, not elsewhere classified: Secondary | ICD-10-CM | POA: Diagnosis not present

## 2014-09-03 DIAGNOSIS — Z79891 Long term (current) use of opiate analgesic: Secondary | ICD-10-CM | POA: Diagnosis not present

## 2014-09-15 DIAGNOSIS — G43109 Migraine with aura, not intractable, without status migrainosus: Secondary | ICD-10-CM | POA: Diagnosis not present

## 2014-09-15 DIAGNOSIS — H6690 Otitis media, unspecified, unspecified ear: Secondary | ICD-10-CM | POA: Diagnosis not present

## 2014-09-18 DIAGNOSIS — M1711 Unilateral primary osteoarthritis, right knee: Secondary | ICD-10-CM | POA: Diagnosis not present

## 2014-09-18 DIAGNOSIS — S8001XA Contusion of right knee, initial encounter: Secondary | ICD-10-CM | POA: Diagnosis not present

## 2014-09-29 DIAGNOSIS — E1165 Type 2 diabetes mellitus with hyperglycemia: Secondary | ICD-10-CM | POA: Diagnosis not present

## 2014-09-30 DIAGNOSIS — G43109 Migraine with aura, not intractable, without status migrainosus: Secondary | ICD-10-CM | POA: Diagnosis not present

## 2014-10-09 DIAGNOSIS — E1165 Type 2 diabetes mellitus with hyperglycemia: Secondary | ICD-10-CM | POA: Diagnosis not present

## 2014-10-09 DIAGNOSIS — E780 Pure hypercholesterolemia: Secondary | ICD-10-CM | POA: Diagnosis not present

## 2014-10-09 DIAGNOSIS — R05 Cough: Secondary | ICD-10-CM | POA: Diagnosis not present

## 2014-10-09 DIAGNOSIS — I10 Essential (primary) hypertension: Secondary | ICD-10-CM | POA: Diagnosis not present

## 2014-10-09 DIAGNOSIS — J309 Allergic rhinitis, unspecified: Secondary | ICD-10-CM | POA: Diagnosis not present

## 2014-10-27 ENCOUNTER — Other Ambulatory Visit: Payer: Self-pay | Admitting: Orthopedic Surgery

## 2014-10-27 DIAGNOSIS — M1711 Unilateral primary osteoarthritis, right knee: Secondary | ICD-10-CM | POA: Diagnosis not present

## 2014-10-27 NOTE — H&P (Signed)
TOTAL KNEE ADMISSION H&P  Patient is being admitted for right total knee arthroplasty.  Subjective:  Chief Complaint:right knee pain.  HPI: Lori Potts, 69 y.o. female, has a history of pain and functional disability in the right knee due to arthritis and has failed non-surgical conservative treatments for greater than 12 weeks to includeNSAID's and/or analgesics, corticosteriod injections, viscosupplementation injections, supervised PT with diminished ADL's post treatment, use of assistive devices, weight reduction as appropriate and activity modification.  Onset of symptoms was gradual, starting 7 years ago with gradually worsening course since that time. The patient noted no past surgery on the right knee(s).  Patient currently rates pain in the right knee(s) at 9 out of 10 with activity. Patient has night pain, pain that interferes with activities of daily living, pain with passive range of motion and joint swelling.  Patient has evidence of periarticular osteophytes, joint subluxation and joint space narrowing by imaging studies. This patient has had Osteoarthritis. There is no active infection.  There are no active problems to display for this patient.  Past Medical History  Diagnosis Date  . Hypertension   . Diabetes mellitus without complication   . GERD (gastroesophageal reflux disease)   . Arthritis     Past Surgical History  Procedure Laterality Date  . Joint replacement      left knee  . Abdominal hysterectomy    . Breast surgery      Augementation  . Back surgery      Lumbar fusion x 2  . Revision of scar tissue rectus muscle    . Pain stimulator    . Spinal cord stimulator battery exchange N/A 10/17/2012    Procedure: SPINAL CORD STIMULATOR BATTERY REMOVAL;  Surgeon: Venita Lickahari Brooks, MD;  Location: [facility] OR;  Service: Orthopedics;  Laterality: N/A;  . Cardiac catheterization  10/2010    [facility]; EF 55%  . Anterior lat lumbar fusion N/A 03/21/2013    Procedure: ANTERIOR  LATERAL LUMBAR FUSION 1 LEVEL/ XLIF L3-L4 ;  Surgeon: Venita Lickahari Brooks, MD;  Location: [facility] OR;  Service: Orthopedics;  Laterality: N/A;  . Posterior cervical fusion/foraminotomy Right 03/21/2013    Procedure: POSTERIOR L2-3 RIGHT FORAMINOTOMY;  Surgeon: Venita Lickahari Brooks, MD;  Location: [facility] OR;  Service: Orthopedics;  Laterality: Right;    No prescriptions prior to admission   Allergies  Allergen Reactions  . Adhesive [Tape] Rash    orig white tape    History  Substance Use Topics  . Smoking status: Former Smoker -- 0.25 packs/day for 6 years    Types: Cigarettes    Quit date: 03/20/2002  . Smokeless tobacco: Not on file  . Alcohol Use: No    No family history on file.   Review of Systems  Constitutional: Negative.   HENT: Negative.   Eyes: Negative.   Respiratory: Negative.   Cardiovascular: Negative.   Gastrointestinal: Negative.   Genitourinary: Negative.   Musculoskeletal: Positive for joint pain.  Skin: Negative.   Neurological: Negative.   Endo/Heme/Allergies: Negative.   Psychiatric/Behavioral: Negative.     Objective:  Physical Exam  Constitutional: She is oriented to person, place, and time. She appears well-developed.  HENT:  Head: Normocephalic.  Eyes: EOM are normal.  Neck: Normal range of motion.  Cardiovascular: Normal rate, normal heart sounds and intact distal pulses.   Respiratory: Effort normal and breath sounds normal.  GI: Soft.  Genitourinary:  Deferred  Musculoskeletal:  Right knee pain with ROM. RLE N/V intact. Calf soft and non tender.  Neurological: She is alert and oriented to person, place, and time. She has normal reflexes.  Skin: Skin is warm and dry.  Psychiatric: Her behavior is normal.    Vital signs in last 24 hours:    Labs:   Estimated body mass index is 32.90 kg/(m^2) as calculated from the following:   Height as of 03/20/13:  (1.6 m).   Weight as of 03/20/13: 84.233 kg (185 lb 11.2 oz).   Imaging Review Plain  radiographs demonstrate moderate degenerative joint disease of the right knee(s). The overall alignment ismild varus. The bone quality appears to be good for age and reported activity level.  Assessment/Plan:  End stage arthritis, right knee   The patient history, physical examination, clinical judgment of the provider and imaging studies are consistent with end stage degenerative joint disease of the right knee(s) and total knee arthroplasty is deemed medically necessary. The treatment options including medical management, injection therapy arthroscopy and arthroplasty were discussed at length. The risks and benefits of total knee arthroplasty were presented and reviewed. The risks due to aseptic loosening, infection, stiffness, patella tracking problems, thromboembolic complications and other imponderables were discussed. The patient acknowledged the explanation, agreed to proceed with the plan and consent was signed. Patient is being admitted for inpatient treatment for surgery, pain control, PT, OT, prophylactic antibiotics, VTE prophylaxis, progressive ambulation and ADL's and discharge planning. The patient is planning to be discharged home with home health services.  Contraindications and adverse affects of Tranexamic acid discussed in detail. Patient denies any of these at this time and understands the risks and benefits.

## 2014-11-13 ENCOUNTER — Inpatient Hospital Stay (HOSPITAL_COMMUNITY)
Admission: RE | Admit: 2014-11-13 | Payer: Commercial Managed Care - HMO | Source: Ambulatory Visit | Admitting: Specialist

## 2014-11-13 ENCOUNTER — Encounter (HOSPITAL_COMMUNITY): Admission: RE | Payer: Self-pay | Source: Ambulatory Visit

## 2014-11-13 SURGERY — ARTHROPLASTY, KNEE, TOTAL
Anesthesia: Spinal | Laterality: Right

## 2014-11-27 DIAGNOSIS — G43101 Migraine with aura, not intractable, with status migrainosus: Secondary | ICD-10-CM | POA: Diagnosis not present

## 2014-12-02 DIAGNOSIS — J320 Chronic maxillary sinusitis: Secondary | ICD-10-CM | POA: Diagnosis not present

## 2014-12-02 DIAGNOSIS — H9312 Tinnitus, left ear: Secondary | ICD-10-CM | POA: Diagnosis not present

## 2014-12-02 DIAGNOSIS — R42 Dizziness and giddiness: Secondary | ICD-10-CM | POA: Diagnosis not present

## 2014-12-04 ENCOUNTER — Ambulatory Visit: Admit: 2014-12-04 | Disposition: A | Discharge status: Home / Self Care | Payer: Self-pay | Admitting: Family Medicine

## 2014-12-04 DIAGNOSIS — Z1231 Encounter for screening mammogram for malignant neoplasm of breast: Secondary | ICD-10-CM | POA: Diagnosis not present

## 2014-12-23 DIAGNOSIS — Z79891 Long term (current) use of opiate analgesic: Secondary | ICD-10-CM | POA: Diagnosis not present

## 2014-12-23 DIAGNOSIS — M961 Postlaminectomy syndrome, not elsewhere classified: Secondary | ICD-10-CM | POA: Diagnosis not present

## 2014-12-23 DIAGNOSIS — M542 Cervicalgia: Secondary | ICD-10-CM | POA: Diagnosis not present

## 2014-12-23 DIAGNOSIS — M5092 Cervical disc disorder, unspecified, mid-cervical region: Secondary | ICD-10-CM | POA: Diagnosis not present

## 2015-01-06 ENCOUNTER — Encounter (HOSPITAL_COMMUNITY): Payer: Self-pay | Admitting: Orthopedic Surgery

## 2015-01-08 DIAGNOSIS — G4700 Insomnia, unspecified: Secondary | ICD-10-CM | POA: Diagnosis not present

## 2015-01-08 DIAGNOSIS — E1165 Type 2 diabetes mellitus with hyperglycemia: Secondary | ICD-10-CM | POA: Diagnosis not present

## 2015-01-08 DIAGNOSIS — J449 Chronic obstructive pulmonary disease, unspecified: Secondary | ICD-10-CM | POA: Diagnosis not present

## 2015-01-08 DIAGNOSIS — F411 Generalized anxiety disorder: Secondary | ICD-10-CM | POA: Diagnosis not present

## 2015-01-08 DIAGNOSIS — E780 Pure hypercholesterolemia: Secondary | ICD-10-CM | POA: Diagnosis not present

## 2015-01-08 DIAGNOSIS — R05 Cough: Secondary | ICD-10-CM | POA: Diagnosis not present

## 2015-01-08 DIAGNOSIS — I10 Essential (primary) hypertension: Secondary | ICD-10-CM | POA: Diagnosis not present

## 2015-01-13 DIAGNOSIS — H66012 Acute suppurative otitis media with spontaneous rupture of ear drum, left ear: Secondary | ICD-10-CM | POA: Diagnosis not present

## 2015-01-21 DIAGNOSIS — G894 Chronic pain syndrome: Secondary | ICD-10-CM | POA: Diagnosis not present

## 2015-01-21 DIAGNOSIS — M5032 Other cervical disc degeneration, mid-cervical region: Secondary | ICD-10-CM | POA: Diagnosis not present

## 2015-01-21 DIAGNOSIS — M542 Cervicalgia: Secondary | ICD-10-CM | POA: Diagnosis not present

## 2015-01-21 DIAGNOSIS — M961 Postlaminectomy syndrome, not elsewhere classified: Secondary | ICD-10-CM | POA: Diagnosis not present

## 2015-01-22 DIAGNOSIS — M5032 Other cervical disc degeneration, mid-cervical region: Secondary | ICD-10-CM | POA: Diagnosis not present

## 2015-01-22 DIAGNOSIS — Z79891 Long term (current) use of opiate analgesic: Secondary | ICD-10-CM | POA: Diagnosis not present

## 2015-01-22 DIAGNOSIS — M5012 Cervical disc disorder with radiculopathy, mid-cervical region: Secondary | ICD-10-CM | POA: Diagnosis not present

## 2015-01-27 ENCOUNTER — Ambulatory Visit
Admission: RE | Admit: 2015-01-27 | Discharge: 2015-01-27 | Disposition: A | Discharge status: Home / Self Care | Payer: Commercial Managed Care - HMO | Source: Ambulatory Visit | Attending: Orthopedic Surgery | Admitting: Orthopedic Surgery

## 2015-01-27 ENCOUNTER — Other Ambulatory Visit: Payer: Self-pay | Admitting: Orthopedic Surgery

## 2015-01-27 ENCOUNTER — Encounter: Payer: Self-pay | Admitting: *Deleted

## 2015-01-27 DIAGNOSIS — R9431 Abnormal electrocardiogram [ECG] [EKG]: Secondary | ICD-10-CM | POA: Insufficient documentation

## 2015-01-27 DIAGNOSIS — M542 Cervicalgia: Secondary | ICD-10-CM

## 2015-01-27 DIAGNOSIS — R200 Anesthesia of skin: Secondary | ICD-10-CM

## 2015-01-27 DIAGNOSIS — I10 Essential (primary) hypertension: Secondary | ICD-10-CM | POA: Insufficient documentation

## 2015-01-27 DIAGNOSIS — E7800 Pure hypercholesterolemia, unspecified: Secondary | ICD-10-CM | POA: Insufficient documentation

## 2015-01-27 DIAGNOSIS — M47812 Spondylosis without myelopathy or radiculopathy, cervical region: Secondary | ICD-10-CM | POA: Diagnosis not present

## 2015-01-27 DIAGNOSIS — M5030 Other cervical disc degeneration, unspecified cervical region: Secondary | ICD-10-CM

## 2015-01-27 DIAGNOSIS — R809 Proteinuria, unspecified: Secondary | ICD-10-CM | POA: Insufficient documentation

## 2015-01-27 DIAGNOSIS — I209 Angina pectoris, unspecified: Secondary | ICD-10-CM | POA: Insufficient documentation

## 2015-01-27 DIAGNOSIS — M5021 Other cervical disc displacement,  high cervical region: Secondary | ICD-10-CM | POA: Diagnosis not present

## 2015-01-27 DIAGNOSIS — R079 Chest pain, unspecified: Secondary | ICD-10-CM | POA: Insufficient documentation

## 2015-01-27 DIAGNOSIS — E0829 Diabetes mellitus due to underlying condition with other diabetic kidney complication: Secondary | ICD-10-CM | POA: Insufficient documentation

## 2015-01-27 MED ORDER — DIAZEPAM 5 MG PO TABS
5.0000 mg | ORAL_TABLET | Freq: Once | ORAL | Status: AC
  Administered 2015-01-27: 5 mg via ORAL

## 2015-01-27 MED ORDER — MEPERIDINE HCL 100 MG/ML IJ SOLN
75.0000 mg | Freq: Once | INTRAMUSCULAR | Status: AC
  Administered 2015-01-27: 75 mg via INTRAMUSCULAR

## 2015-01-27 MED ORDER — IOHEXOL 300 MG/ML  SOLN
10.0000 mL | Freq: Once | INTRAMUSCULAR | Status: AC | PRN
  Administered 2015-01-27: 10 mL via INTRATHECAL

## 2015-01-27 MED ORDER — ONDANSETRON HCL 4 MG/2ML IJ SOLN
4.0000 mg | Freq: Once | INTRAMUSCULAR | Status: AC
  Administered 2015-01-27: 4 mg via INTRAMUSCULAR

## 2015-01-27 NOTE — Discharge Instructions (Signed)
Myelogram Discharge Instructions  1. Go home and rest quietly for the next 24 hours.  It is important to lie flat for the next 24 hours.  Get up only to go to the restroom.  You may lie in the bed or on a couch on your back, your stomach, your left side or your right side.  You may have one pillow under your head.  You may have pillows between your knees while you are on your side or under your knees while you are on your back.  2. DO NOT drive today.  Recline the seat as far back as it will go, while still wearing your seat belt, on the way home.  3. You may get up to go to the bathroom as needed.  You may sit up for 10 minutes to eat.  You may resume your normal diet and medications unless otherwise indicated.  Drink plenty of extra fluids today and tomorrow.  4. The incidence of a spinal headache with nausea and/or vomiting is about 5% (one in 20 patients).  If you develop a headache, lie flat and drink plenty of fluids until the headache goes away.  Caffeinated beverages may be helpful.  If you develop severe nausea and vomiting or a headache that does not go away with flat bed rest, call [phone].  5. You may resume normal activities after your 24 hours of bed rest is over; however, do not exert yourself strongly or do any heavy lifting tomorrow.  6. Call your physician for a follow-up appointment.   You may resume Tramadol on Wednesday, January 28, 2015 after 1:00p.m.

## 2015-01-27 NOTE — Progress Notes (Signed)
Patient states she has been off Tramadol for the past two days.   

## 2015-02-02 DIAGNOSIS — M5032 Other cervical disc degeneration, mid-cervical region: Secondary | ICD-10-CM | POA: Diagnosis not present

## 2015-02-02 DIAGNOSIS — M5012 Cervical disc disorder with radiculopathy, mid-cervical region: Secondary | ICD-10-CM | POA: Diagnosis not present

## 2015-02-09 DIAGNOSIS — M1711 Unilateral primary osteoarthritis, right knee: Secondary | ICD-10-CM | POA: Diagnosis not present

## 2015-02-20 DIAGNOSIS — M5012 Cervical disc disorder with radiculopathy, mid-cervical region: Secondary | ICD-10-CM | POA: Diagnosis not present

## 2015-02-24 ENCOUNTER — Encounter (HOSPITAL_COMMUNITY): Payer: Self-pay

## 2015-02-24 ENCOUNTER — Encounter (HOSPITAL_COMMUNITY)
Admission: RE | Admit: 2015-02-24 | Discharge: 2015-02-24 | Disposition: A | Discharge status: Home / Self Care | Payer: Commercial Managed Care - HMO | Source: Ambulatory Visit | Attending: Orthopedic Surgery | Admitting: Orthopedic Surgery

## 2015-02-24 DIAGNOSIS — M1990 Unspecified osteoarthritis, unspecified site: Secondary | ICD-10-CM | POA: Diagnosis not present

## 2015-02-24 DIAGNOSIS — Z981 Arthrodesis status: Secondary | ICD-10-CM | POA: Diagnosis not present

## 2015-02-24 DIAGNOSIS — Z96652 Presence of left artificial knee joint: Secondary | ICD-10-CM | POA: Diagnosis present

## 2015-02-24 DIAGNOSIS — M542 Cervicalgia: Secondary | ICD-10-CM | POA: Diagnosis not present

## 2015-02-24 DIAGNOSIS — E119 Type 2 diabetes mellitus without complications: Secondary | ICD-10-CM | POA: Diagnosis not present

## 2015-02-24 DIAGNOSIS — M4322 Fusion of spine, cervical region: Secondary | ICD-10-CM | POA: Diagnosis not present

## 2015-02-24 DIAGNOSIS — J449 Chronic obstructive pulmonary disease, unspecified: Secondary | ICD-10-CM | POA: Diagnosis not present

## 2015-02-24 DIAGNOSIS — K219 Gastro-esophageal reflux disease without esophagitis: Secondary | ICD-10-CM | POA: Diagnosis not present

## 2015-02-24 DIAGNOSIS — Z87891 Personal history of nicotine dependence: Secondary | ICD-10-CM | POA: Diagnosis not present

## 2015-02-24 DIAGNOSIS — E780 Pure hypercholesterolemia: Secondary | ICD-10-CM | POA: Diagnosis present

## 2015-02-24 DIAGNOSIS — I10 Essential (primary) hypertension: Secondary | ICD-10-CM | POA: Diagnosis not present

## 2015-02-24 DIAGNOSIS — M545 Low back pain: Secondary | ICD-10-CM | POA: Diagnosis not present

## 2015-02-24 DIAGNOSIS — M5012 Cervical disc disorder with radiculopathy, mid-cervical region: Secondary | ICD-10-CM | POA: Diagnosis not present

## 2015-02-24 HISTORY — DX: Personal history of other diseases of the nervous system and sense organs: Z86.69

## 2015-02-24 HISTORY — DX: Personal history of colon polyps, unspecified: Z86.0100

## 2015-02-24 HISTORY — DX: Weakness: R53.1

## 2015-02-24 HISTORY — DX: Nocturia: R35.1

## 2015-02-24 HISTORY — DX: Hyperlipidemia, unspecified: E78.5

## 2015-02-24 HISTORY — DX: Low back pain, unspecified: M54.50

## 2015-02-24 HISTORY — DX: Low back pain: M54.5

## 2015-02-24 HISTORY — DX: Vitamin D deficiency, unspecified: E55.9

## 2015-02-24 HISTORY — DX: Personal history of other infectious and parasitic diseases: Z86.19

## 2015-02-24 HISTORY — DX: Other seasonal allergic rhinitis: J30.2

## 2015-02-24 HISTORY — DX: Unspecified cataract: H26.9

## 2015-02-24 HISTORY — DX: Personal history of colonic polyps: Z86.010

## 2015-02-24 LAB — CBC
HCT: 37.7 % (ref 36.0–46.0)
Hemoglobin: 12.6 g/dL (ref 12.0–15.0)
MCH: 26.1 pg (ref 26.0–34.0)
MCHC: 33.4 g/dL (ref 30.0–36.0)
MCV: 78.1 fL (ref 78.0–100.0)
Platelets: 339 10*3/uL (ref 150–400)
RBC: 4.83 MIL/uL (ref 3.87–5.11)
RDW: 14 % (ref 11.5–15.5)
WBC: 4.8 10*3/uL (ref 4.0–10.5)

## 2015-02-24 LAB — BASIC METABOLIC PANEL
Anion gap: 9 (ref 5–15)
BUN: 18 mg/dL (ref 6–20)
CO2: 26 mmol/L (ref 22–32)
Calcium: 9.3 mg/dL (ref 8.9–10.3)
Chloride: 103 mmol/L (ref 101–111)
Creatinine, Ser: 0.69 mg/dL (ref 0.44–1.00)
GFR calc Af Amer: >60 mL/min (ref >60)
GFR calc non Af Amer: >60 mL/min (ref >60)
Glucose, Bld: 156 mg/dL — ABNORMAL HIGH (ref 65–99)
Potassium: 4.1 mmol/L (ref 3.5–5.1)
Sodium: 138 mmol/L (ref 135–145)

## 2015-02-24 LAB — SURGICAL PCR SCREEN
MRSA, PCR: NEGATIVE
Staphylococcus aureus: NEGATIVE

## 2015-02-24 LAB — GLUCOSE, CAPILLARY: Glucose-Capillary: 185 mg/dL — ABNORMAL HIGH (ref 65–99)

## 2015-02-24 NOTE — Progress Notes (Addendum)
Cardiologist is Dr.Caldwell in [place] with last visit Jan 2016  Medical MD is Dr.Lemont Morrisey in [place]  Echo done in Jan 2016  Stress test done in Jan 2016  [facility] in 2012  EKG done in Jan 2016  CXR done in Jan 2016

## 2015-02-24 NOTE — Progress Notes (Signed)
Anesthesia Chart Review:  Pt is 69 year old female scheduled for C4-5 ACDF on 02/26/2015 with Dr. Shon BatonBrooks.   PMH includes: HTN, hyperlipidemia, DM. Former smoker. BMI 32. S/p anterior lumbar fusion, posterior foraminotomy 03/21/13. S/p spinal cord stimulator battery removal 10/17/12.   Medications include: ASA, albuterol, lipitor, levemir, lisinopril-hctz,   Preoperative labs reviewed.  HgbA1c pending.   EKG will need to be obtained DOS. EKG from 2015 on chart for comparison.   Echo 08/28/2014: -Normal LV systolic function -Normal RV systolic function -no valvular regurgitation -no valvular stenosis -normal overall LV function, EF > 55%  Nuclear stress test 12/20/2010: -No significant stress induced defects.  -EF 60% with normal wall motion.   Cardiac cath 11/10/2010: -Normal coronaries -Normal LVF, normal wall motion, EF=55%  If EKG acceptable DOS, I anticipate pt can proceed as scheduled.   Rica Mastngela Bradey Luzier, [name]-BC [facility]/Anesthesiology Phone: [phone] 02/24/2015 2:56 PM

## 2015-02-24 NOTE — Pre-Procedure Instructions (Signed)
Jimmie MollyMary H Hasten  02/24/2015      [id number]- [place], [place] - [address] [place] [address] Phone: [phone] Fax: [phone]    Your procedure is scheduled on Thurs, July 7 @ 7:30 AM  [facility] Admitting at 5:30 AM  Call this number if you have problems the morning of surgery:  [phone]   Remember:  Do not eat food or drink liquids after midnight.  Take these medicines the morning of surgery with A SIP OF WATER Albuterol<Bring Your Inhaler With You>,Singulair(Montelukast),Zofran(Ondansetron-if needed),and Pain Pill(if needed)              No Goody's,BC's,Aleve,Aspirin,Ibuprofen,Fish Oil,or any Herbal Medications. .   Do not wear jewelry, make-up or nail polish.  Do not wear lotions, powders, or perfumes.  You may wear deodorant.  Do not shave 48 hours prior to surgery.    Do not bring valuables to the hospital.   [facility] is not responsible for any belongings or valuables.  Contacts, dentures or bridgework may not be worn into surgery.  Leave your suitcase in the car.  After surgery it may be brought to your room.  For patients admitted to the hospital, discharge time will be determined by your treatment team.  Patients discharged the day of surgery will not be allowed to drive home.    Special instructions:  [facility] - Preparing for Surgery  Before surgery, you can play an important role.  Because skin is not sterile, your skin needs to be as free of germs as possible.  You can reduce the number of germs on you skin by washing with CHG (chlorahexidine gluconate) soap before surgery.  CHG is an antiseptic cleaner which kills germs and bonds with the skin to continue killing germs even after washing.  Please DO NOT use if you have an allergy to CHG or antibacterial soaps.  If your skin becomes reddened/irritated stop using the CHG and inform your nurse when you arrive at Short Stay.  Do not shave (including  legs and underarms) for at least 48 hours prior to the first CHG shower.  You may shave your face.  Please follow these instructions carefully:   1.  Shower with CHG Soap the night before surgery and the                                morning of Surgery.  2.  If you choose to wash your hair, wash your hair first as usual with your       normal shampoo.  3.  After you shampoo, rinse your hair and body thoroughly to remove the                      Shampoo.  4.  Use CHG as you would any other liquid soap.  You can apply chg directly       to the skin and wash gently with scrungie or a clean washcloth.  5.  Apply the CHG Soap to your body ONLY FROM THE NECK DOWN.        Do not use on open wounds or open sores.  Avoid contact with your eyes,       ears, mouth and genitals (private parts).  Wash genitals (private parts)       with your normal soap.  6.  Wash  thoroughly, paying special attention to the area where your surgery        will be performed.  7.  Thoroughly rinse your body with warm water from the neck down.  8.  DO NOT shower/wash with your normal soap after using and rinsing off       the CHG Soap.  9.  Pat yourself dry with a clean towel.            10.  Wear clean pajamas.            11.  Place clean sheets on your bed the night of your first shower and do not        sleep with pets.  Day of Surgery  Do not apply any lotions/deoderants the morning of surgery.  Please wear clean clothes to the hospital/surgery center.    Please read over the following fact sheets that you were given. Pain Booklet, Coughing and Deep Breathing, MRSA Information and Surgical Site Infection Prevention

## 2015-02-25 LAB — HEMOGLOBIN A1C
Hgb A1c MFr Bld: 7.1 % — ABNORMAL HIGH (ref 4.8–5.6)
Mean Plasma Glucose: 157 mg/dL

## 2015-02-25 MED ORDER — CEFAZOLIN SODIUM-DEXTROSE 2-3 GM-% IV SOLR
2.0000 g | INTRAVENOUS | Status: AC
  Administered 2015-02-26: 2 g via INTRAVENOUS
  Filled 2015-02-25: qty 50

## 2015-02-26 ENCOUNTER — Inpatient Hospital Stay (HOSPITAL_COMMUNITY): Payer: Commercial Managed Care - HMO | Admitting: Emergency Medicine

## 2015-02-26 ENCOUNTER — Inpatient Hospital Stay (HOSPITAL_COMMUNITY): Payer: Commercial Managed Care - HMO | Admitting: Certified Registered Nurse Anesthetist

## 2015-02-26 ENCOUNTER — Inpatient Hospital Stay (HOSPITAL_COMMUNITY): Payer: Commercial Managed Care - HMO

## 2015-02-26 ENCOUNTER — Encounter (HOSPITAL_COMMUNITY): Payer: Self-pay | Admitting: *Deleted

## 2015-02-26 ENCOUNTER — Inpatient Hospital Stay (HOSPITAL_COMMUNITY)
Admission: RE | Admit: 2015-02-26 | Discharge: 2015-02-27 | DRG: 473 | Disposition: A | Discharge status: Home / Self Care | Payer: Commercial Managed Care - HMO | Source: Ambulatory Visit | Attending: Orthopedic Surgery | Admitting: Orthopedic Surgery

## 2015-02-26 ENCOUNTER — Encounter (HOSPITAL_COMMUNITY)
Admission: RE | Disposition: A | Discharge status: Home / Self Care | Payer: Self-pay | Source: Ambulatory Visit | Attending: Orthopedic Surgery

## 2015-02-26 DIAGNOSIS — Z87891 Personal history of nicotine dependence: Secondary | ICD-10-CM | POA: Diagnosis not present

## 2015-02-26 DIAGNOSIS — Z96652 Presence of left artificial knee joint: Secondary | ICD-10-CM | POA: Diagnosis present

## 2015-02-26 DIAGNOSIS — M542 Cervicalgia: Secondary | ICD-10-CM | POA: Diagnosis present

## 2015-02-26 DIAGNOSIS — E780 Pure hypercholesterolemia: Secondary | ICD-10-CM | POA: Diagnosis present

## 2015-02-26 DIAGNOSIS — Z9889 Other specified postprocedural states: Secondary | ICD-10-CM

## 2015-02-26 DIAGNOSIS — M5012 Cervical disc disorder with radiculopathy, mid-cervical region: Principal | ICD-10-CM | POA: Diagnosis present

## 2015-02-26 DIAGNOSIS — E119 Type 2 diabetes mellitus without complications: Secondary | ICD-10-CM | POA: Diagnosis present

## 2015-02-26 DIAGNOSIS — Z981 Arthrodesis status: Secondary | ICD-10-CM | POA: Diagnosis not present

## 2015-02-26 DIAGNOSIS — K219 Gastro-esophageal reflux disease without esophagitis: Secondary | ICD-10-CM | POA: Diagnosis present

## 2015-02-26 DIAGNOSIS — J449 Chronic obstructive pulmonary disease, unspecified: Secondary | ICD-10-CM | POA: Diagnosis present

## 2015-02-26 DIAGNOSIS — Z419 Encounter for procedure for purposes other than remedying health state, unspecified: Secondary | ICD-10-CM

## 2015-02-26 HISTORY — PX: ANTERIOR CERVICAL DECOMP/DISCECTOMY FUSION: SHX1161

## 2015-02-26 LAB — GLUCOSE, CAPILLARY
Glucose-Capillary: 127 mg/dL — ABNORMAL HIGH (ref 65–99)
Glucose-Capillary: 116 mg/dL — ABNORMAL HIGH (ref 65–99)
Glucose-Capillary: 110 mg/dL — ABNORMAL HIGH (ref 65–99)
Glucose-Capillary: 132 mg/dL — ABNORMAL HIGH (ref 65–99)
Glucose-Capillary: 127 mg/dL — ABNORMAL HIGH (ref 65–99)

## 2015-02-26 SURGERY — ANTERIOR CERVICAL DECOMPRESSION/DISCECTOMY FUSION 1 LEVEL
Anesthesia: General | Site: Neck

## 2015-02-26 MED ORDER — HYDROMORPHONE HCL 1 MG/ML IJ SOLN
INTRAMUSCULAR | Status: AC
  Filled 2015-02-26: qty 1

## 2015-02-26 MED ORDER — LIDOCAINE HCL (CARDIAC) 20 MG/ML IV SOLN
INTRAVENOUS | Status: DC | PRN
  Administered 2015-02-26: 80 mg via INTRAVENOUS
  Administered 2015-02-26: 50 mg via INTRATRACHEAL

## 2015-02-26 MED ORDER — ONDANSETRON HCL 4 MG/2ML IJ SOLN: 4.0000 mg | INTRAMUSCULAR | Status: DC | PRN

## 2015-02-26 MED ORDER — GLYCOPYRROLATE 0.2 MG/ML IJ SOLN
INTRAMUSCULAR | Status: DC | PRN
  Administered 2015-02-26: 0.6 mg via INTRAVENOUS

## 2015-02-26 MED ORDER — GELATIN ABSORBABLE MT POWD
OROMUCOSAL | Status: DC | PRN
  Administered 2015-02-26: 08:00:00 via TOPICAL

## 2015-02-26 MED ORDER — LISINOPRIL 20 MG PO TABS
20.0000 mg | ORAL_TABLET | Freq: Every day | ORAL | Status: DC
  Administered 2015-02-26: 20 mg via ORAL
  Filled 2015-02-26 (×2): qty 1

## 2015-02-26 MED ORDER — DIPHENHYDRAMINE HCL 25 MG PO CAPS
25.0000 mg | ORAL_CAPSULE | Freq: Four times a day (QID) | ORAL | Status: DC | PRN
  Administered 2015-02-26 – 2015-02-27 (×3): 25 mg via ORAL
  Filled 2015-02-26 (×3): qty 1

## 2015-02-26 MED ORDER — HYDROMORPHONE HCL 1 MG/ML IJ SOLN
0.2500 mg | INTRAMUSCULAR | Status: DC | PRN
  Administered 2015-02-26 (×4): 0.5 mg via INTRAVENOUS

## 2015-02-26 MED ORDER — LACTATED RINGERS IV SOLN
INTRAVENOUS | Status: DC
Start: 2015-02-26 — End: 2015-02-27

## 2015-02-26 MED ORDER — PHENOL 1.4 % MT LIQD
1.0000 | OROMUCOSAL | Status: DC | PRN
  Filled 2015-02-26: qty 177

## 2015-02-26 MED ORDER — FENTANYL CITRATE (PF) 100 MCG/2ML IJ SOLN
INTRAMUSCULAR | Status: DC | PRN
  Administered 2015-02-26: 50 ug via INTRAVENOUS
  Administered 2015-02-26 (×2): 100 ug via INTRAVENOUS

## 2015-02-26 MED ORDER — PROPOFOL 10 MG/ML IV BOLUS
INTRAVENOUS | Status: DC | PRN
  Administered 2015-02-26: 140 mg via INTRAVENOUS

## 2015-02-26 MED ORDER — BUPIVACAINE-EPINEPHRINE 0.25% -1:200000 IJ SOLN
INTRAMUSCULAR | Status: DC | PRN
  Administered 2015-02-26: 4 mL

## 2015-02-26 MED ORDER — METHOCARBAMOL 500 MG PO TABS
500.0000 mg | ORAL_TABLET | Freq: Four times a day (QID) | ORAL | Status: DC | PRN
  Administered 2015-02-26: 500 mg via ORAL
  Filled 2015-02-26 (×2): qty 1

## 2015-02-26 MED ORDER — ROCURONIUM BROMIDE 100 MG/10ML IV SOLN
INTRAVENOUS | Status: DC | PRN
  Administered 2015-02-26: 10 mg via INTRAVENOUS
  Administered 2015-02-26: 40 mg via INTRAVENOUS

## 2015-02-26 MED ORDER — SODIUM CHLORIDE 0.9 % IJ SOLN: 3.0000 mL | Freq: Two times a day (BID) | INTRAMUSCULAR | Status: DC

## 2015-02-26 MED ORDER — PHENYLEPHRINE HCL 10 MG/ML IJ SOLN
INTRAMUSCULAR | Status: DC | PRN
  Administered 2015-02-26: 80 ug via INTRAVENOUS
  Administered 2015-02-26 (×2): 120 ug via INTRAVENOUS

## 2015-02-26 MED ORDER — MIDAZOLAM HCL 2 MG/2ML IJ SOLN
INTRAMUSCULAR | Status: AC
  Filled 2015-02-26: qty 2

## 2015-02-26 MED ORDER — OXYCODONE HCL 5 MG PO TABS
10.0000 mg | ORAL_TABLET | ORAL | Status: DC | PRN
  Administered 2015-02-26 – 2015-02-27 (×5): 10 mg via ORAL
  Filled 2015-02-26 (×5): qty 2

## 2015-02-26 MED ORDER — FENTANYL CITRATE (PF) 250 MCG/5ML IJ SOLN
INTRAMUSCULAR | Status: AC
  Filled 2015-02-26: qty 5

## 2015-02-26 MED ORDER — INSULIN DETEMIR 100 UNIT/ML ~~LOC~~ SOLN
15.0000 [IU] | Freq: Every day | SUBCUTANEOUS | Status: DC
  Administered 2015-02-26: 15 [IU] via SUBCUTANEOUS
  Filled 2015-02-26 (×2): qty 0.15

## 2015-02-26 MED ORDER — NEOSTIGMINE METHYLSULFATE 10 MG/10ML IV SOLN
INTRAVENOUS | Status: DC | PRN
  Administered 2015-02-26: 4 mg via INTRAVENOUS

## 2015-02-26 MED ORDER — OXYCODONE-ACETAMINOPHEN 10-325 MG PO TABS: 1.0000 | ORAL_TABLET | ORAL | Status: DC | PRN

## 2015-02-26 MED ORDER — METHOCARBAMOL 1000 MG/10ML IJ SOLN
500.0000 mg | Freq: Four times a day (QID) | INTRAMUSCULAR | Status: DC | PRN
  Administered 2015-02-26: 500 mg via INTRAVENOUS
  Filled 2015-02-26 (×2): qty 5

## 2015-02-26 MED ORDER — LACTATED RINGERS IV SOLN
INTRAVENOUS | Status: DC | PRN
  Administered 2015-02-26: 07:00:00 via INTRAVENOUS

## 2015-02-26 MED ORDER — ONDANSETRON HCL 4 MG PO TABS: 4.0000 mg | ORAL_TABLET | Freq: Three times a day (TID) | ORAL | Status: DC | PRN

## 2015-02-26 MED ORDER — CEFAZOLIN SODIUM 1-5 GM-% IV SOLN
1.0000 g | Freq: Three times a day (TID) | INTRAVENOUS | Status: AC
  Administered 2015-02-26 (×2): 1 g via INTRAVENOUS
  Filled 2015-02-26 (×2): qty 50

## 2015-02-26 MED ORDER — HYDROCHLOROTHIAZIDE 25 MG PO TABS
25.0000 mg | ORAL_TABLET | Freq: Every day | ORAL | Status: DC
  Administered 2015-02-26: 25 mg via ORAL
  Filled 2015-02-26 (×2): qty 1

## 2015-02-26 MED ORDER — PROPOFOL 10 MG/ML IV BOLUS
INTRAVENOUS | Status: AC
  Filled 2015-02-26: qty 20

## 2015-02-26 MED ORDER — METHOCARBAMOL 500 MG PO TABS: 500.0000 mg | ORAL_TABLET | Freq: Three times a day (TID) | ORAL | Status: DC | PRN

## 2015-02-26 MED ORDER — ALBUTEROL SULFATE (2.5 MG/3ML) 0.083% IN NEBU: 2.5000 mg | INHALATION_SOLUTION | Freq: Four times a day (QID) | RESPIRATORY_TRACT | Status: DC | PRN

## 2015-02-26 MED ORDER — SODIUM CHLORIDE 0.9 % IV SOLN: 250.0000 mL | INTRAVENOUS | Status: DC

## 2015-02-26 MED ORDER — BUPIVACAINE-EPINEPHRINE (PF) 0.25% -1:200000 IJ SOLN
INTRAMUSCULAR | Status: AC
  Filled 2015-02-26: qty 30

## 2015-02-26 MED ORDER — SODIUM CHLORIDE 0.9 % IJ SOLN: 3.0000 mL | INTRAMUSCULAR | Status: DC | PRN

## 2015-02-26 MED ORDER — LISINOPRIL-HYDROCHLOROTHIAZIDE 20-25 MG PO TABS: 1.0000 | ORAL_TABLET | Freq: Every day | ORAL | Status: DC

## 2015-02-26 MED ORDER — MONTELUKAST SODIUM 10 MG PO TABS
10.0000 mg | ORAL_TABLET | Freq: Every day | ORAL | Status: DC | PRN
  Filled 2015-02-26: qty 1

## 2015-02-26 MED ORDER — ACETAMINOPHEN 10 MG/ML IV SOLN
1000.0000 mg | Freq: Four times a day (QID) | INTRAVENOUS | Status: AC
  Administered 2015-02-26 – 2015-02-27 (×4): 1000 mg via INTRAVENOUS
  Filled 2015-02-26 (×6): qty 100

## 2015-02-26 MED ORDER — MORPHINE SULFATE 2 MG/ML IJ SOLN
1.0000 mg | INTRAMUSCULAR | Status: DC | PRN
  Administered 2015-02-26: 4 mg via INTRAVENOUS
  Administered 2015-02-26: 2 mg via INTRAVENOUS
  Filled 2015-02-26: qty 1
  Filled 2015-02-26: qty 2

## 2015-02-26 MED ORDER — MENTHOL 3 MG MT LOZG
1.0000 | LOZENGE | OROMUCOSAL | Status: DC | PRN
Start: 2015-02-26 — End: 2015-02-27

## 2015-02-26 MED ORDER — PHENYLEPHRINE HCL 10 MG/ML IJ SOLN
10.0000 mg | INTRAVENOUS | Status: DC | PRN
  Administered 2015-02-26: 40 ug/min via INTRAVENOUS

## 2015-02-26 MED ORDER — ONDANSETRON HCL 4 MG/2ML IJ SOLN
INTRAMUSCULAR | Status: DC | PRN
  Administered 2015-02-26: 4 mg via INTRAVENOUS

## 2015-02-26 MED ORDER — THROMBIN 20000 UNITS EX SOLR
CUTANEOUS | Status: AC
  Filled 2015-02-26: qty 20000

## 2015-02-26 SURGICAL SUPPLY — 62 items
BLADE SURG ROTATE 9660 (MISCELLANEOUS) IMPLANT
BUR EGG ELITE 4.0 (BURR) IMPLANT
BUR EGG ELITE 4.0MM (BURR)
BUR MATCHSTICK NEURO 3.0 LAGG (BURR) IMPLANT
CANISTER SUCTION 2500CC (MISCELLANEOUS) ×3 IMPLANT
CLOSURE STERI-STRIP 1/2X4 (GAUZE/BANDAGES/DRESSINGS) ×1
CLSR STERI-STRIP ANTIMIC 1/2X4 (GAUZE/BANDAGES/DRESSINGS) ×2 IMPLANT
CORDS BIPOLAR (ELECTRODE) ×3 IMPLANT
COVER SURGICAL LIGHT HANDLE (MISCELLANEOUS) ×6 IMPLANT
CRADLE DONUT ADULT HEAD (MISCELLANEOUS) ×3 IMPLANT
DRAPE C-ARM 42X72 X-RAY (DRAPES) ×3 IMPLANT
DRAPE POUCH INSTRU U-SHP 10X18 (DRAPES) ×3 IMPLANT
DRAPE SURG 17X23 STRL (DRAPES) ×3 IMPLANT
DRAPE U-SHAPE 47X51 STRL (DRAPES) ×3 IMPLANT
DRSG MEPILEX BORDER 4X4 (GAUZE/BANDAGES/DRESSINGS) ×3 IMPLANT
DURAPREP 26ML APPLICATOR (WOUND CARE) ×3 IMPLANT
ELECT COATED BLADE 2.86 ST (ELECTRODE) ×3 IMPLANT
ELECT PENCIL ROCKER SW 15FT (MISCELLANEOUS) ×3 IMPLANT
ELECT REM PT RETURN 9FT ADLT (ELECTROSURGICAL) ×3
ELECTRODE REM PT RTRN 9FT ADLT (ELECTROSURGICAL) ×1 IMPLANT
GLOVE BIOGEL PI IND STRL 8 (GLOVE) ×1 IMPLANT
GLOVE BIOGEL PI IND STRL 8.5 (GLOVE) ×1 IMPLANT
GLOVE BIOGEL PI INDICATOR 8 (GLOVE) ×2
GLOVE BIOGEL PI INDICATOR 8.5 (GLOVE) ×2
GLOVE ORTHO TXT STRL SZ7.5 (GLOVE) ×3 IMPLANT
GLOVE SS BIOGEL STRL SZ 8.5 (GLOVE) ×1 IMPLANT
GLOVE SUPERSENSE BIOGEL SZ 8.5 (GLOVE) ×2
GOWN STRL REUS W/ TWL XL LVL3 (GOWN DISPOSABLE) ×1 IMPLANT
GOWN STRL REUS W/TWL 2XL LVL3 (GOWN DISPOSABLE) ×6 IMPLANT
GOWN STRL REUS W/TWL XL LVL3 (GOWN DISPOSABLE) ×3
INTERLOCK LRDTC CRVCL VBR 6MM (Bone Implant) IMPLANT
KIT BASIN OR (CUSTOM PROCEDURE TRAY) ×3 IMPLANT
KIT ROOM TURNOVER OR (KITS) ×3 IMPLANT
LORDOTIC CERVICAL VBR 6MM SM (Bone Implant) ×3 IMPLANT
NDL SPNL 18GX3.5 QUINCKE PK (NEEDLE) ×1 IMPLANT
NEEDLE SPNL 18GX3.5 QUINCKE PK (NEEDLE) ×3 IMPLANT
NS IRRIG 1000ML POUR BTL (IV SOLUTION) ×3 IMPLANT
PACK ORTHO CERVICAL (CUSTOM PROCEDURE TRAY) ×3 IMPLANT
PACK UNIVERSAL I (CUSTOM PROCEDURE TRAY) ×3 IMPLANT
PAD ARMBOARD 7.5X6 YLW CONV (MISCELLANEOUS) ×6 IMPLANT
PATTIES SURGICAL .25X.25 (GAUZE/BANDAGES/DRESSINGS) IMPLANT
PIN RETAINER PRODISC 14 MM (PIN) ×4 IMPLANT
PLATE SKYLINE 12MM (Plate) ×2 IMPLANT
PUTTY BONE DBX 2.5 MIS (Bone Implant) ×2 IMPLANT
RESTRAINT LIMB HOLDER UNIV (RESTRAINTS) ×3 IMPLANT
SCREW VARIABLE SELF TAP 12MM (Screw) ×8 IMPLANT
SPONGE INTESTINAL PEANUT (DISPOSABLE) ×3 IMPLANT
SPONGE SURGIFOAM ABS GEL 100 (HEMOSTASIS) ×3 IMPLANT
SURGIFLO TRUKIT (HEMOSTASIS) IMPLANT
SUT BONE WAX W31G (SUTURE) ×3 IMPLANT
SUT MON AB 3-0 SH 27 (SUTURE) ×3
SUT MON AB 3-0 SH27 (SUTURE) ×1 IMPLANT
SUT SILK 2 0 (SUTURE)
SUT SILK 2-0 18XBRD TIE 12 (SUTURE) IMPLANT
SUT VIC AB 2-0 CT1 18 (SUTURE) ×3 IMPLANT
SYR BULB IRRIGATION 50ML (SYRINGE) ×3 IMPLANT
SYR CONTROL 10ML LL (SYRINGE) ×3 IMPLANT
TAPE CLOTH 4X10 WHT NS (GAUZE/BANDAGES/DRESSINGS) ×3 IMPLANT
TAPE UMBILICAL COTTON 1/8X30 (MISCELLANEOUS) ×3 IMPLANT
TOWEL OR 17X24 6PK STRL BLUE (TOWEL DISPOSABLE) ×3 IMPLANT
TOWEL OR 17X26 10 PK STRL BLUE (TOWEL DISPOSABLE) ×3 IMPLANT
WATER STERILE IRR 1000ML POUR (IV SOLUTION) ×3 IMPLANT

## 2015-02-26 NOTE — Progress Notes (Signed)
Utilization review completed.  

## 2015-02-26 NOTE — Anesthesia Postprocedure Evaluation (Signed)
  Anesthesia Post-op Note  Patient: Lori Potts  Procedure(s) Performed: Procedure(s): ANTERIOR CERVICAL DISCECTOMY FUSION C4-5 (1 LEVEL) (N/A)  Patient Location: PACU  Anesthesia Type:General  Level of Consciousness: awake  Airway and Oxygen Therapy: Patient Spontanous Breathing  Post-op Pain: mild  Post-op Assessment: Post-op Vital signs reviewed              Post-op Vital Signs: Reviewed  Last Vitals:  Filed Vitals:   02/26/15 1141  BP: 140/51  Pulse: 67  Temp:   Resp: 11    Complications: No apparent anesthesia complications

## 2015-02-26 NOTE — Discharge Instructions (Signed)

## 2015-02-26 NOTE — H&P (Signed)
History of Present Illness The patient is a 69 year old female who comes in today for a preoperative History and Physical. The patient is scheduled for a ACDF C4-5 to be performed by Dr. Debria Garret D. Shon Baton, MD at [facility] on 02-26-15 . Please see the hospital record for complete dictated history and physical. the pt has DM she reports she was put on insulin a few months ago and her daily blood sugars are well controlled.   Allergies  No Known Drug Allergies08/03/2011  Family History  Kidney disease father Hypertension Father, Mother. child Liver Disease, Chronic First Degree Relatives. father Rheumatoid Arthritis child Osteoarthritis child Heart disease in female family member before age 65 Depression First Degree Relatives. mother and child Congestive Heart Failure Mother. mother Diabetes Mellitus Mother. mother Heart Disease Mother. Drug / Alcohol Addiction Father.  Social History Tobacco / smoke exposure 06/04/2014: no no Tobacco use Former smoker. 06/04/2014: smoke(d) less than 1/2 pack(s) per day former smoker; smoke(d) less than 1/2 pack(s) per day Non smoker / no tobacco use Number of flights of stairs before winded 4-5 greater than 5 No history of drug/alcohol rehab Non drinker / no alcohol Use Pain Contract no Under pain contract Previously in rehab no Current work status unemployed Drug/Alcohol Rehab (Currently) no Alcohol use never consumed alcohol Children 3 Exercise Exercises daily; does running / walking Exercises daily; does gym / weights Living situation live with spouse Marital status married Former drinker 06/04/2014: In the past drank Illicit drug use no  Medication History  Vitamin D (Ergocalciferol) (50000UNIT Capsule, Oral) Active. Lisinopril-Hydrochlorothiazide (20-25MG  Tablet, Oral) Active. Atorvastatin Calcium (  Tablet, Oral) Active. Albuterol Sulfate ((2.5 MG/3ML)0.083% Nebulized Soln, Inhalation)  Active. Percocet (10-325MG  Tablet, 1 (one) Oral five times daily as needed for pain, Taken starting 01/30/2015) Active. Medications Reconciled  Past Surgical History Foot Surgery bilateral Total Knee Replacement left Arthroscopic Knee Surgery - Right Breast Reconstruction bilateral Arthroscopy of Knee bilateral left Hysterectomy partial (non-cancerous) Spinal Surgery Total Knee Replacement - Left Breast Mass; Local Excision right Colectomy partial Back Surgery SCS Tubal Ligation Other Surgery SMALL INTESTINE FOR BOWEL BLOCKAGE Spinal Fusion lower back Appendectomy  Other Problems Chronic Obstructive Lung Disease Diabetes Mellitus, Type II Gastroesophageal Reflux Disease High blood pressure Hypercholesterolemia Migraine Headache  Vitals  02/20/2015 11:59 AM Weight: 183 lb Height: 63in Weight was reported by patient. Height was reported by patient. Body Surface Area: 1.86 m Body Mass Index: 32.42 kg/m  Temp.: 44F(Oral)  Pulse: 80 (Regular)  BP: 149/69 (Sitting, Left Arm, Standard)  Physical Exam  General General Appearance-Not in acute distress. Orientation-Oriented X3. Build & Nutrition-Well nourished and Well developed.  Integumentary General Characteristics Surgical Scars - no surgical scar evidence of previous cervical surgery. Cervical Spine-Skin examination of the cervical spine is without deformity, skin lesions, lacerations or abrasions.  Chest and Lung Exam Auscultation Breath sounds - Normal and Clear.  Cardiovascular Auscultation Rhythm - Regular rate and rhythm.  Abdomen Palpation/Percussion Palpation and Percussion of the abdomen reveal - Soft, Non Tender and No Rebound tenderness.  Peripheral Vascular Upper Extremity Palpation - Radial pulse - Bilateral - 2+.  Neurologic Sensation Upper Extremity - Bilateral - sensation is intact in the upper extremity. Reflexes Biceps Reflex - Bilateral -  2+. Brachioradialis Reflex - Bilateral - 2+. Triceps Reflex - Bilateral - 2+. Hoffman's Sign - Bilateral - Hoffman's sign not present.  Musculoskeletal Spine/Ribs/Pelvis  Cervical Spine : Inspection and Palpation - Tenderness - right upper trapezius area tender to palpation, left upper  trapezius area tender to palpation, right cervical paraspinals tender to palpation and left cervical paraspinals tender to palpation, bony/soft tissue palpation of the cervical spine and shoulders does not recreate their typical pain. Strength and Tone: Strength - Deltoid - Bilateral - 5/5. Biceps - Bilateral - 5/5. Triceps - Bilateral - 5/5. Wrist Extension - Bilateral - 5/5. Hand Grip - Bilateral - 5/5. Heel walk - Bilateral - able to heel walk without difficulty. Toe Walk - Bilateral - able to walk on toes without difficulty. Heel-Toe Walk - Bilateral - able to heel-toe walk without difficulty. ROM - Flexion - Mildly decreased and painful. Extension - Mildly decreased and painful. Left Lateral Flexion - Mildly decreased. Right Lateral Flexion - Mildly decreased. Pain - extension is more painful than flexion. Cervical Spine - Special Testing - axial compression test negative, cross chest impingement test negative. Non-Anatomic Signs - No non-anatomic signs present. Upper Extremity Range of Motion - No truesholder pain with IR/ER of the shoulders.  RADIOGRAPHS Her x-ray, CT, myelogram are reviewed. It actually is unchanged. She has disc osteophyte complex causing partial effacement of the cerebral spinal fluid at 4-5 with uncovertebral spurring, worse on the left, mild-to-moderate foraminal stenosis at 5-6 and 6-7 with moderate degenerative disc disease   Assessment & Plan  At this point in time, in reviewing her clinical history, the only intervention that has provided her temporary relief was C5 selective nerve root block. Even though she has got multiple degenerative diseases, I did point I would favor just doing  C4-C5 ACF, as I planned on doing last year. I think this will address the worst level, prevent from doing multilevel procedures. She is aware that we are just trying to reduce, not eliminate her pain. She will always have some degree of neck discomfort, but i think she will have a better quality of life from this one level. we reviewed the risks and benefits. As soon as have preoperative medical clearance, we will move forward with a single-level procedure.  At this point, we go through the plan. Even though there are multilevel changes, 4-5 level seems to be the most prominent and so this is the single level that we will be doing. My hope is to reduce her pain and improve her quality of life. All of the risks and benefits were reviewed again. We have got preoperative medical clearance. Unfortunately, her husband who is also patient of mine was just hospitalized today with renal failure and most likely will be on dialysis. I told her that if he did not improve and she is concerned about him, I am happy to postpone surgery.  Goal Of Surgery:Discussed that goal of surgery is to reduce pain and improve function and quality of life. Patient is aware that despite all appropriate treatment that there pain and function could be the same, worse, or different. Anterior cervical fusion:Risks of surgery include, but are not limited to: Throat pain, swallowing difficulty, hoarseness or change in voice, death, stroke, paralysis, nerve root damage/injury, bleeding, blood clots, loss of bowel/bladder control, hardware failure, or mal-position, spinal fluid leak, adjacent segment disease, non-union, need for further surgery, ongoing or worse pain, infection. Post-operative bleeding or swelling that could require emergent surgery.

## 2015-02-26 NOTE — Brief Op Note (Signed)
02/26/2015  9:37 AM  PATIENT:  Ward ChattersMary H Fellman  69 y.o. female  PRE-OPERATIVE DIAGNOSIS:  C5 RADICULAR PAIN   POST-OPERATIVE DIAGNOSIS:  C5 RADICULAR PAIN   PROCEDURE:  Procedure(s): ANTERIOR CERVICAL DISCECTOMY FUSION C4-5 (1 LEVEL) (N/A)  SURGEON:  Surgeon(s) and Role:    * Venita Lickahari Guerry Covington, MD - Primary  PHYSICIAN ASSISTANT:   ASSISTANTS: Carmen Mayo   ANESTHESIA:   general  EBL:     BLOOD ADMINISTERED:none  DRAINS: none   LOCAL MEDICATIONS USED:  MARCAINE     SPECIMEN:  No Specimen  DISPOSITION OF SPECIMEN:  N/A  COUNTS:  YES  TOURNIQUET:  * No tourniquets in log *  DICTATION: .Other Dictation: Dictation Number [id number]  PLAN OF CARE: Admit for overnight observation  PATIENT DISPOSITION:  PACU - hemodynamically stable.

## 2015-02-26 NOTE — Transfer of Care (Signed)
Immediate Anesthesia Transfer of Care Note  Patient: Lori Potts  Procedure(s) Performed: Procedure(s): ANTERIOR CERVICAL DISCECTOMY FUSION C4-5 (1 LEVEL) (N/A)  Patient Location: PACU  Anesthesia Type:General  Level of Consciousness: awake, alert , oriented and patient cooperative  Airway & Oxygen Therapy: Patient Spontanous Breathing and Patient connected to face mask oxygen  Post-op Assessment: Report given to RN, Post -op Vital signs reviewed and stable, Patient moving all extremities and Patient moving all extremities X 4  Post vital signs: Reviewed and stable  Last Vitals:  Filed Vitals:   02/26/15 0548  BP: 109/47  Pulse: 63  Temp: 36.4 C  Resp: 20    Complications: No apparent anesthesia complications

## 2015-02-26 NOTE — Evaluation (Signed)
Physical Therapy Evaluation Patient Details Name: Lori Potts MRN: [id number] DOB: [birth year] Today's Date: 02/26/2015   History of Present Illness  ANTERIOR CERVICAL DISCECTOMY FUSION C4-5  02/26/15  Clinical Impression  Patient is mobilizing well. Ambulated x 200'. No DME needed.  Patient will benefit from PT to address problems listed in note below to return to functional independence.    Follow Up Recommendations No PT follow up;Supervision - Intermittent    Equipment Recommendations  None recommended by PT    Recommendations for Other Services       Precautions / Restrictions Precautions Precautions: Cervical Precaution Comments: reviewed cervical precautions      Mobility  Bed Mobility Overal bed mobility: Needs Assistance Bed Mobility: Rolling;Sidelying to Sit;Sit to Sidelying Rolling: Min guard Sidelying to sit: Min assist     Sit to sidelying: Min assist General bed mobility comments: assist with Legs onto bed, cues for sequence  Transfers Overall transfer level: Needs assistance   Transfers: Sit to/from Stand Sit to Stand: Min guard         General transfer comment: cues for sequence  Ambulation/Gait Ambulation/Gait assistance: Min guard Ambulation Distance (Feet): 200 Feet Assistive device: None Gait Pattern/deviations: WFL(Within Functional Limits)     General Gait Details: slow steady gait.  Stairs            Wheelchair Mobility    Modified Rankin (Stroke Patients Only)       Balance                                             Pertinent Vitals/Pain Pain Assessment: 0-10 Pain Score: 5  Pain Location: cervical Pain Descriptors / Indicators: Aching;Sore Pain Intervention(s): Monitored during session;Premedicated before session    Home Living Family/patient expects to be discharged to:: Private residence Living Arrangements: Spouse/significant other Available Help at Discharge: Family Type of Home:  House Home Access: Stairs to enter Entrance Stairs-Rails: Can reach both Entrance Stairs-Number of Steps: 3 Home Layout: One level Home Equipment: Bedside commode;Shower seat      Prior Function Level of Independence: Independent               Hand Dominance        Extremity/Trunk Assessment   Upper Extremity Assessment: Defer to OT evaluation           Lower Extremity Assessment: Overall WFL for tasks assessed      Cervical / Trunk Assessment: Normal  Communication   Communication: No difficulties  Cognition Arousal/Alertness: Awake/alert Behavior During Therapy: WFL for tasks assessed/performed Overall Cognitive Status: Within Functional Limits for tasks assessed                      General Comments      Exercises        Assessment/Plan    PT Assessment Patient needs continued PT services  PT Diagnosis Difficulty walking;Acute pain   PT Problem List Decreased activity tolerance;Decreased mobility;Decreased knowledge of precautions;Pain  PT Treatment Interventions Gait training;Stair training;Functional mobility training;Therapeutic activities;Patient/family education   PT Goals (Current goals can be found in the Care Plan section) Acute Rehab PT Goals Patient Stated Goal: to go home tomorrow PT Goal Formulation: With patient/family Time For Goal Achievement: 03/05/15 Potential to Achieve Goals: Good    Frequency Min 5X/week   Barriers to discharge  Co-evaluation               End of Session   Activity Tolerance: Patient tolerated treatment well Patient left: in bed;with call bell/phone within reach;with family/visitor present Nurse Communication: Mobility status         Time: [id number] PT Time Calculation (min) (ACUTE ONLY): 23 min   Charges:   PT Evaluation $Initial PT Evaluation Tier I: 1 Procedure PT Treatments $Gait Training: 8-22 mins   PT G Codes:        Rada HayHill, Emonee Winkowski Elizabeth 02/26/2015, 4:29  PM Blanchard KelchKaren Thurlow Gallaga PT [phone]

## 2015-02-26 NOTE — Anesthesia Preprocedure Evaluation (Addendum)
Anesthesia Evaluation  Patient identified by MRN, date of birth, ID band Patient awake    Reviewed: Allergy & Precautions, NPO status , Patient's Chart, lab work & pertinent test results  Airway Mallampati: II  TM Distance: >3 FB Neck ROM: Full    Dental  (+) Edentulous Upper, Edentulous Lower   Pulmonary former smoker,  breath sounds clear to auscultation        Cardiovascular hypertension, + angina Rhythm:Regular     Neuro/Psych    GI/Hepatic negative GI ROS, Neg liver ROS,   Endo/Other  diabetes  Renal/GU negative Renal ROS     Musculoskeletal   Abdominal   Peds  Hematology   Anesthesia Other Findings   Reproductive/Obstetrics                           Anesthesia Physical Anesthesia Plan  ASA: III  Anesthesia Plan: General   Post-op Pain Management:    Induction: Intravenous  Airway Management Planned: Oral ETT  Additional Equipment:   Intra-op Plan:   Post-operative Plan: Possible Post-op intubation/ventilation  Informed Consent: I have reviewed the patients History and Physical, chart, labs and discussed the procedure including the risks, benefits and alternatives for the proposed anesthesia with the patient or authorized representative who has indicated his/her understanding and acceptance.   Dental advisory given  Plan Discussed with: CRNA and Anesthesiologist  Anesthesia Plan Comments:         Anesthesia Quick Evaluation

## 2015-02-26 NOTE — Anesthesia Procedure Notes (Signed)
Procedure Name: Intubation Date/Time: 02/26/2015 7:45 AM Performed by: Adonis HousekeeperNGELL, Ivoree Felmlee M Pre-anesthesia Checklist: Patient identified, Emergency Drugs available, Suction available and Patient being monitored Patient Re-evaluated:Patient Re-evaluated prior to inductionOxygen Delivery Method: Circle system utilized Preoxygenation: Pre-oxygenation with 100% oxygen Intubation Type: IV induction Ventilation: Mask ventilation without difficulty and Oral airway inserted - appropriate to patient size Laryngoscope Size: Mac and 3 Grade View: Grade I Tube type: Oral Tube size: 7.0 mm Number of attempts: 1 Airway Equipment and Method: Stylet Placement Confirmation: ETT inserted through vocal cords under direct vision,  positive ETCO2 and breath sounds checked- equal and bilateral Secured at: 22 cm Tube secured with: Tape Dental Injury: Teeth and Oropharynx as per pre-operative assessment

## 2015-02-27 ENCOUNTER — Encounter (HOSPITAL_COMMUNITY): Payer: Self-pay | Admitting: Orthopedic Surgery

## 2015-02-27 LAB — GLUCOSE, CAPILLARY: Glucose-Capillary: 93 mg/dL (ref 65–99)

## 2015-02-27 NOTE — Op Note (Signed)
NAMEAINARA, ELDRIDGE              ACCOUNT NO.:  [id number]  MEDICAL RECORD NO.:  [id number]  LOCATION:  3C08C                        FACILITY:  [facility]  PHYSICIAN:  Jlyn Cerros D. Shon Baton, M.D. 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  OPERATIVE PROCEDURE:  Anterior cervical diskectomy and fusion C4-5.  COMPLICATIONS:  None.  IMPLANTS USED:  Titanium size 6 small lordotic cage packed with DBX mix with a size 12 DePuy Skyline anterior cervical plate affixed with 14 mm screws.  COMPLICATIONS:  None.  FINAL IMAGING STUDIES:  Satisfactory.  HISTORY:  This is a very pleasant woman, who has been under my care for several years now.  She was having progressive neck and radicular arm pain.  Attempts at conservative management had failed and the only thing that provided some relief was the C5 selective nerve root block, although she has multilevel degenerative changes, she did not want to obtain multilevel cervical fusion.  Given the fact that the majority of her pain was coming from one level, we elected to proceed with just addressing S1 level.  All appropriate risks, benefits, and alternatives, including postoperative pain and new radicular symptoms were secondary to adjacent segment disease were addressed.  Consent was obtained.  OPERATIVE NOTE:  The patient was brought to the operating room, placed supine on the operating table.  After successful induction of general anesthesia and endotracheal intubation, TEDs, SCDs were applied. Inflatable cuff was placed underneath the scapula and the anterior cervical spine was prepped and draped in standard fashion.  Time-out was taken to confirm patient, procedure, and all other pertinent important data.  X-ray and fluoro view  was taken to determine the C4-5 level for skin incision.  I infiltrated the incision site with 0.25% Marcaine with epi and then made a transverse incision starting at the midline and proceeding to the left.  I then proceeded with a standard Smith-Robinson approach.  I dissected through the platysma and then along the medial border of sternocleidomastoid, identified and protected the carotid sheath laterally and identified the esophagus, trachea, and omohyoid medially.  Retractor was placed to protect these structures and then I was able to bluntly dissect through the prevertebral fascia and exposed the anterior cervical spine.  Once I had done this, I placed a needle into the 4-5 disk space and took an x-ray and confirmed I was at the appropriate level.  Using bipolar electrocautery, I mobilized the longus colli muscles from the midbody of 4 to the midbody of 5.  Self-retaining retractor blades and the Caspar system were placed underneath the longus colli muscle. The endotracheal cuff was deflated.  I expanded the retractors to the appropriate width and reinflated the cuff.  A 15 blade scalpel was used to perform an annulotomy and I removed the bulk of the disk material neutralizers.  I then trimmed down the inferior osteophytes from the C4 vertebral body allowing the better visualization of the disk space.  I then placed distraction pins into the body of C5 and C4,  distracted the intervertebral space with a lamina spreader and maintained the distraction with the distraction pin set.  I then continued to work posteriorly towards the annulus.  Once I was down posterior, I used a 1 mm Kerrison and trimmed down the posterior osteophytes from the body of the C4 and C5.  I then used a small nerve hook to develop a plane underneath the annulus and used my 1 mm Kerrison to resect the posterior annulus.  I then dissected through the posterior longitudinal ligament in a similar fashion and  resected the PLL as well. This allowed me to get underneath the uncovertebral joints bilaterally and decompressed the foramen.  At this point, I had an adequate decompression in the C4-5 level.  I rasped the endplates, so I had bleeding subchondral bone.  Then I trialed a 5, 6, and 7 intervertebral spacer.  The size 6 small gave the best restoration of disk height and depth.  As a result, I elected to proceed with this.  We obtained the actual implant, it was packed with DBX mix, and then malleted to the appropriate depth.  Once I confirmed satisfactory position in the lateral fluoro view, I removed the distraction pins and blocked the holes with bone wax.  I then applied a size 12 DePuy Skyline anterior cervical plate and with self-drilling screws, I inserted four 14 mm screws.  All screws had excellent purchase.  Once they were 2 finger tight and countersunk, I then locked the plate to the screws according to manufacturer's standards.  I removed the Caspar retracting blades and made sure the esophagus was not entrapped beneath the plate.  I then irrigated the wound copiously with normal saline and took final x-rays. They were satisfactory.  Platysma was reapproximated with 2-0 Vicryl, and the skin with 3-0 Monocryl.  Steri-Strips and dry dressing were applied.  The patient was ultimately extubated, transferred to PACU without incident.  At the end of the case, all needle and sponge counts were correct.  There were no adverse intraoperative events.  First assistant was [employer]sCarmen Mayo, my PA.     Cloa Bushong D. Shon BatonBrooks, M.D.   ______________________________ Donn Pieriniahari D. Shon BatonBrooks, M.D.    DDB/MEDQ  D:  02/26/2015  T:  02/27/2015  Job:  [id number]

## 2015-02-27 NOTE — Progress Notes (Signed)
Patient alert and oriented, mae's well, voiding adequate amount of urine, swallowing without difficulty, no c/o pain. Patient discharged home with family. Script and discharged instructions given to patient. Patient and family stated understanding of d/c instructions given and has an appointment with MD. 

## 2015-02-27 NOTE — Progress Notes (Signed)
    Subjective: Procedure(s) (LRB): ANTERIOR CERVICAL DISCECTOMY FUSION C4-5 (1 LEVEL) (N/A) 1 Day Post-Op  Patient reports pain as 3 on 0-10 scale.  Reports decreased arm pain reports incisional neck pain   Positive void Negative bowel movement Positive flatus Negative chest pain or shortness of breath  Objective: Vital signs in last 24 hours: Temp:  [97.3 F (36.3 C)-98.5 F (36.9 C)] 98.3 F (36.8 C) (07/08 0400) Pulse Rate:  [57-85] 73 (07/08 0400) Resp:  [9-27] 20 (07/08 0400) BP: (106-145)/(42-71) 122/45 mmHg (07/08 0400) SpO2:  [92 %-100 %] 95 % (07/08 0400)  Intake/Output from previous day: 07/07 0701 - 07/08 0700 In: 750 [I.V.:750] Out: 75 [Blood:75]  Labs:  Recent Labs  02/24/15 0943  WBC 4.8  RBC 4.83  HCT 37.7  PLT 339    Recent Labs  02/24/15 0943  NA 138  K 4.1  CL 103  CO2 26  BUN 18  CREATININE 0.69  GLUCOSE 156*  CALCIUM 9.3   No results for input(s): LABPT, INR in the last 72 hours.  Physical Exam: Neurologically intact ABD soft Incision: dressing C/D/I Compartment soft  Assessment/Plan: Patient stable  xrays satisfactory Mobilization with physical therapy Encourage incentive spirometry Continue care  Advance diet Up with therapy  Plan on d/c to home today.  Doing well.  Radicular arm pain resolved  Venita Lickahari Nnenna Meador, MD [facility] [phone]

## 2015-02-27 NOTE — Progress Notes (Signed)
Occupational Therapy Evaluation Patient Details Name: Lori Potts MRN: [id number] DOB: 1945-09-27 Today's Date: 02/27/2015    History of Present Illness ANTERIOR CERVICAL DISCECTOMY FUSION C4-5  02/26/15   Clinical Impression   Completed all education regarding ADL and functional mobility and cervical precautions. Pt verbalized understanding. Ready for D/C.    Follow Up Recommendations  No OT follow up;Supervision - Intermittent    Equipment Recommendations  None recommended by OT    Recommendations for Other Services       Precautions / Restrictions Precautions Precautions: Cervical Precaution Comments: reviewed cervical precautions Restrictions Weight Bearing Restrictions: No      Mobility Bed Mobility               General bed mobility comments: reviewed log rolling techique. Pt stes she plans to sleep in a recliner  Transfers Overall transfer level: Modified independent Equipment used: None Transfers: Sit to/from Stand Sit to Stand: Modified independent (Device/Increase time)         General transfer comment: reviewed proper body mechanics to adhere to cervical precautions    Balance Overall balance assessment: No apparent balance deficits (not formally assessed)                                          ADL                                         General ADL Comments: Completed education regarding compensatory techniques to adhere to cervical precautions. completed ADL session with pt while husband present. Educated on NIKEdonning/dofing Aspen collar. Discussed home safety and reducing riask of falls. Pt/husband able to return demonstrate.      Vision     Perception     Praxis      Pertinent Vitals/Pain Pain Assessment: 0-10 Pain Score: 10-Worst pain ever Pain Location: neck Pain Descriptors / Indicators: Aching Pain Intervention(s): Limited activity within patient's tolerance;Monitored during  session;Premedicated before session     Hand Dominance Right   Extremity/Trunk Assessment Upper Extremity Assessment Upper Extremity Assessment: Overall WFL for tasks assessed (LUE weaker than R but improved since surgery per pt.)   Lower Extremity Assessment Lower Extremity Assessment: Overall WFL for tasks assessed   Cervical / Trunk Assessment Cervical / Trunk Assessment: Normal   Communication Communication Communication: No difficulties   Cognition Arousal/Alertness: Awake/alert Behavior During Therapy: WFL for tasks assessed/performed Overall Cognitive Status: Within Functional Limits for tasks assessed                     General Comments       Exercises       Shoulder Instructions      Home Living Family/patient expects to be discharged to:: Private residence Living Arrangements: Spouse/significant other Available Help at Discharge: Family Type of Home: House Home Access: Stairs to enter Secretary/administratorntrance Stairs-Number of Steps: 3 Entrance Stairs-Rails: Can reach both Home Layout: One level     Bathroom Shower/Tub: Producer, television/film/videoWalk-in shower   Bathroom Toilet: Standard     Home Equipment: Bedside commode;Shower seat - built in          Prior Functioning/Environment Level of Independence: Independent             OT Diagnosis: Generalized weakness;Acute pain   OT Problem  List: Decreased activity tolerance;Decreased strength;Decreased knowledge of precautions;Pain   OT Treatment/Interventions:      OT Goals(Current goals can be found in the care plan section) Acute Rehab OT Goals Patient Stated Goal: to go home OT Goal Formulation: All assessment and education complete, DC therapy  OT Frequency:     Barriers to D/C:            Co-evaluation              End of Session Equipment Utilized During Treatment: Cervical collar Nurse Communication: Mobility status;Other (comment) (ready for D/C)  Activity Tolerance: Patient tolerated treatment  well Patient left: Other (comment) (walking with PT)   Time: 1610-9604 OT Time Calculation (min): 15 min Charges:  OT General Charges $OT Visit: 1 Procedure OT Evaluation $Initial OT Evaluation Tier I: 1 Procedure G-Codes:    Ronen Bromwell,HILLARY 26-Mar-2015, 10:44 AM   Luisa Dago, OTR/L  [phone] 03/26/2015

## 2015-02-27 NOTE — Progress Notes (Signed)
Physical Therapy Treatment Patient Details Name: Lori Potts MRN: [id number] DOB: 05/20/1946 Today's Date: 02/27/2015    History of Present Illness ANTERIOR CERVICAL DISCECTOMY FUSION C4-5  02/26/15    PT Comments    Patient mobilizing well despite soreness. Patient performed stair negotiation without difficulty. Educated on mobility expectations and reinforced precautions. No further acute PT needs, will sign off.  Follow Up Recommendations  No PT follow up;Supervision - Intermittent     Equipment Recommendations  None recommended by PT    Recommendations for Other Services       Precautions / Restrictions Precautions Precautions: Cervical Precaution Comments: reviewed cervical precautions Restrictions Weight Bearing Restrictions: No    Mobility  Bed Mobility               General bed mobility comments: received post OT   Transfers Overall transfer level: Needs assistance Equipment used: None Transfers: Sit to/from Stand Sit to Stand: Modified independent (Device/Increase time)         General transfer comment: increased time to perform  Ambulation/Gait Ambulation/Gait assistance: Modified independent (Device/Increase time) Ambulation Distance (Feet): 340 Feet Assistive device: None Gait Pattern/deviations: WFL(Within Functional Limits) Gait velocity: decreased Gait velocity interpretation: Below normal speed for age/gender General Gait Details: slow steady gait.   Stairs Stairs: Yes Stairs assistance: Supervision Stair Management: One rail Right;Step to pattern;Forwards Number of Stairs: 8 General stair comments: VCs for technique with cervical restricitions. Educated on safety with negotiation of stairs. Tolerated well.  Wheelchair Mobility    Modified Rankin (Stroke Patients Only)       Balance                                    Cognition Arousal/Alertness: Awake/alert Behavior During Therapy: WFL for tasks  assessed/performed Overall Cognitive Status: Within Functional Limits for tasks assessed                      Exercises      General Comments        Pertinent Vitals/Pain Pain Assessment: 0-10 Pain Score: 6  Pain Location: cervical  Pain Descriptors / Indicators: Aching;Sore Pain Intervention(s): Monitored during session;Premedicated before session    Home Living                      Prior Function            PT Goals (current goals can now be found in the care plan section) Acute Rehab PT Goals Patient Stated Goal: to go home tomorrow PT Goal Formulation: With patient/family Time For Goal Achievement: 03/05/15 Potential to Achieve Goals: Good Progress towards PT goals: Goals met/education completed, patient discharged from PT    Frequency  Min 5X/week    PT Plan Current plan remains appropriate    Co-evaluation             End of Session Equipment Utilized During Treatment: Cervical collar Activity Tolerance: Patient tolerated treatment well Patient left: in bed;with call bell/phone within reach;with family/visitor present (EOB)     Time: 0847-0903 PT Time Calculation (min) (ACUTE ONLY): 16 min  Charges:  $Self Care/Home Management: 8-22                    G Codes:      Werner, Devon J 02/27/2015, 9:18 AM Devon Werner, PT DPT  [phone]   

## 2015-03-06 NOTE — Discharge Summary (Signed)
Patient ID: Lori Potts MRN: [id number] DOB/AGE: [birth year] 69 y.o.  Admit date: 02/26/2015 Discharge date: 03/06/2015  Admission Diagnoses:  Active Problems:   Neck pain   Status post lumbar surgery   Discharge Diagnoses:  Active Problems:   Neck pain   Status post lumbar surgery  status post Procedure(s): ANTERIOR CERVICAL DISCECTOMY FUSION C4-5 (1 LEVEL)  Past Medical History  Diagnosis Date  . Arthritis   . Hyperlipidemia     takes Lipitor daily  . Essential hypertension, benign     takes Lisinopril-HCTZ daily  . Unspecified essential hypertension   . Hypertension   . Vitamin D deficiency     takes Vit D weekly  . Seasonal allergies     takes Singulair daily as needed  . History of migraine     last one 2-3 months ago  . Weakness     numbness and tingling left arm  . Joint pain   . Low back pain   . History of colon polyps     benign  . Nocturia   . Type II or unspecified type diabetes mellitus without mention of complication, not stated as uncontrolled     Levemir nightly;average fasting blood sugar 125-140  . Diabetes mellitus without complication   . Cataract     right eye but immature  . History of shingles     Surgeries: Procedure(s): ANTERIOR CERVICAL DISCECTOMY FUSION C4-5 (1 LEVEL) on 02/26/2015   Consultants:    Discharged Condition: Improved  Hospital Course: Lori Potts is an 69 y.o. female who was admitted 02/26/2015 for operative treatment of <principal problem not specified>. Patient failed conservative treatments (please see the history and physical for the specifics) and had severe unremitting pain that affects sleep, daily activities and work/hobbies. After pre-op clearance, the patient was taken to the operating room on 02/26/2015 and underwent  Procedure(s): ANTERIOR CERVICAL DISCECTOMY FUSION C4-5 (1 LEVEL).    Patient was given perioperative antibiotics:  Anti-infectives    Start     Dose/Rate Route Frequency Ordered Stop   02/26/15 1400  ceFAZolin (ANCEF) IVPB 1 g/50 mL premix     1 g 100 mL/hr over 30 Minutes Intravenous Every 8 hours 02/26/15 1203 02/26/15 2057   02/26/15 0630  ceFAZolin (ANCEF) IVPB 2 g/50 mL premix     2 g 100 mL/hr over 30 Minutes Intravenous To [facility] 02/25/15 2323 02/26/15 0746       Patient was given sequential compression devices and early ambulation to prevent DVT.   Patient benefited maximally from hospital stay and there were no complications. At the time of discharge, the patient was urinating/moving their bowels without difficulty, tolerating a regular diet, pain is controlled with oral pain medications and they have been cleared by PT/OT.   Recent vital signs: No data found.    Recent laboratory studies: No results for input(s): WBC, HGB, HCT, PLT, NA, K, CL, CO2, BUN, CREATININE, GLUCOSE, INR, CALCIUM in the last 72 hours.  Invalid input(s): PT, 2   Discharge Medications:     Medication List    STOP taking these medications        aspirin EC 81 MG tablet     Oxycodone HCl 10 MG Tabs     traMADol 50 MG tablet  Commonly known as:  ULTRAM      TAKE these medications        albuterol 108 (90 BASE) MCG/ACT inhaler  Commonly known as:  PROVENTIL HFA;VENTOLIN HFA  Inhale 2 puffs into the lungs every 6 (six) hours as needed (for wheezing and shortness of breath from seasonal allergies).     atorvastatin 40 MG tablet  Commonly known as:  LIPITOR  Take 40 mg by mouth daily.     docusate sodium 100 MG capsule  Commonly known as:  COLACE  Take 1 capsule (100 mg total) by mouth 3 (three) times daily as needed for constipation.     LEVEMIR FLEXTOUCH 100 UNIT/ML Pen  Generic drug:  Insulin Detemir  Inject 15 Units into the skin at bedtime.     lisinopril-hydrochlorothiazide 20-25 MG per tablet  Commonly known as:  PRINZIDE,ZESTORETIC  Take 1 tablet by mouth daily.     methocarbamol 500 MG tablet  Commonly known as:  ROBAXIN  Take 1 tablet (500 mg  total) by mouth 3 (three) times daily as needed for muscle spasms.     montelukast 10 MG tablet  Commonly known as:  SINGULAIR  Take 10 mg by mouth daily as needed (seasonal allergies).     multivitamin with minerals Tabs tablet  Take 1 tablet by mouth daily at 12 noon.     ondansetron 4 MG tablet  Commonly known as:  ZOFRAN  Take 1 tablet (4 mg total) by mouth every 8 (eight) hours as needed for nausea or vomiting.     oxyCODONE-acetaminophen 10-325 MG per tablet  Commonly known as:  PERCOCET  Take 1 tablet by mouth every 4 (four) hours as needed for pain.     polyethylene glycol powder powder  Commonly known as:  GLYCOLAX  Take 17 g by mouth daily.     Vitamin D (Ergocalciferol) 50000 UNITS Caps capsule  Commonly known as:  DRISDOL  Take 50,000 Units by mouth every 30 (thirty) days. On the 1st of each month        Diagnostic Studies: Dg Cervical Spine 2 Or 3 Views  02/26/2015   CLINICAL DATA:  Postop cervical fusion.  EXAM: CERVICAL SPINE - 2-3 VIEW  COMPARISON:  No prior.  FINDINGS: C4-C5 anterior and interbody fusion with good anatomic alignment. No acute bony abnormality. Hardware intact.  IMPRESSION: C4-C5 anterior and interbody fusion with good anatomic alignment.   Electronically Signed   By: Maisie Fushomas  Register   On: 02/26/2015 13:43   Dg Cervical Spine 2-3 Views  02/26/2015   CLINICAL DATA:  Cervical spine surgery.  EXAM: CERVICAL SPINE - 2-3 VIEW  COMPARISON:  None.  FINDINGS: Anterior and interbody C4-C5 fusion. Hardware intact. No acute bony abnormality identified.  IMPRESSION: Anterior and interbody C4-C5 fusion with good anatomic alignment.   Electronically Signed   By: Maisie Fushomas  Register   On: 02/26/2015 09:43   Dg C-arm 1-60 Min  02/26/2015   CLINICAL DATA:  Cervical spine surgery .  EXAM: DG C-ARM 61-120 MIN  COMPARISON:  None.  FINDINGS: C4-C5 anterior and interbody fusion with good anatomic alignment. Hardware intact. No acute bony abnormality.  IMPRESSION: C4-C5  anterior and interbody fusion with good anatomic alignment.   Electronically Signed   By: Maisie Fushomas  Register   On: 02/26/2015 09:58          Follow-up Information    Follow up with Venita LickBROOKS,Yasuko Lapage D, MD. Schedule an appointment as soon as possible for a visit in 2 weeks.   Specialty:  Orthopedic Surgery   Why:  For wound re-check, For suture removal   Contact information:   [address] [place] [address] [phone]  Discharge Plan:  discharge to home  Disposition:  Hospital course uneventful.  Plan on f/u in 2 weeks   Signed: Venita Lick D for Dr. Venita Lick [facility] [phone] 03/06/2015, 4:36 PM

## 2015-03-10 DIAGNOSIS — Z981 Arthrodesis status: Secondary | ICD-10-CM | POA: Diagnosis not present

## 2015-03-10 DIAGNOSIS — Z4789 Encounter for other orthopedic aftercare: Secondary | ICD-10-CM | POA: Diagnosis not present

## 2015-03-11 ENCOUNTER — Other Ambulatory Visit: Payer: Self-pay | Admitting: Emergency Medicine

## 2015-03-11 MED ORDER — INSULIN PEN NEEDLE 29G X 12.7MM MISC: 1.0000 "pen " | Freq: Once | Status: DC

## 2015-03-12 ENCOUNTER — Telehealth: Payer: Self-pay

## 2015-03-12 NOTE — Telephone Encounter (Signed)
 Ortho called and asked we place a [employer] referral for Dr. Ethelene Hal and Dr. Thomasena Edis and to fax it to [place]  At [phone] once we receive the authorization via fax I'll send it to her. Dr. Ethelene Hal, Auth # [id number], Valid 03/12/2015 - 09/08/2015 ICD-10: M54.2 Dr. Thomasena Edis, Auth # [id number], Valid 03/12/2015 - 09/08/2015 ICD-10: M5.561

## 2015-03-16 ENCOUNTER — Ambulatory Visit: Payer: Self-pay | Admitting: Family Medicine

## 2015-03-19 ENCOUNTER — Other Ambulatory Visit: Payer: Self-pay | Admitting: Family Medicine

## 2015-03-24 ENCOUNTER — Other Ambulatory Visit: Payer: Self-pay

## 2015-03-24 MED ORDER — HYDROCOD POLST-CPM POLST ER 10-8 MG/5ML PO SUER: 5.0000 mL | Freq: Two times a day (BID) | ORAL | Status: DC | PRN

## 2015-03-24 MED ORDER — VITAMIN D (ERGOCALCIFEROL) 1.25 MG (50000 UNIT) PO CAPS: 50000.0000 [IU] | ORAL_CAPSULE | ORAL | Status: DC

## 2015-04-06 DIAGNOSIS — M1711 Unilateral primary osteoarthritis, right knee: Secondary | ICD-10-CM | POA: Diagnosis not present

## 2015-04-06 DIAGNOSIS — S8001XD Contusion of right knee, subsequent encounter: Secondary | ICD-10-CM | POA: Diagnosis not present

## 2015-04-07 DIAGNOSIS — Z4789 Encounter for other orthopedic aftercare: Secondary | ICD-10-CM | POA: Diagnosis not present

## 2015-04-13 ENCOUNTER — Encounter: Payer: Self-pay | Admitting: Family Medicine

## 2015-04-13 ENCOUNTER — Ambulatory Visit (INDEPENDENT_AMBULATORY_CARE_PROVIDER_SITE_OTHER): Payer: Commercial Managed Care - HMO | Admitting: Family Medicine

## 2015-04-13 VITALS — BP 110/60 | HR 85 | Temp 98.3°F | Resp 16 | Ht 66.0 in | Wt 181.9 lb

## 2015-04-13 DIAGNOSIS — G8929 Other chronic pain: Secondary | ICD-10-CM | POA: Diagnosis not present

## 2015-04-13 DIAGNOSIS — J0100 Acute maxillary sinusitis, unspecified: Secondary | ICD-10-CM

## 2015-04-13 DIAGNOSIS — H6503 Acute serous otitis media, bilateral: Secondary | ICD-10-CM | POA: Diagnosis not present

## 2015-04-13 DIAGNOSIS — Z820 Family history of epilepsy and other diseases of the nervous system: Secondary | ICD-10-CM | POA: Diagnosis not present

## 2015-04-13 DIAGNOSIS — H6500 Acute serous otitis media, unspecified ear: Secondary | ICD-10-CM | POA: Insufficient documentation

## 2015-04-13 MED ORDER — AMOXICILLIN-POT CLAVULANATE 875-125 MG PO TABS: 1.0000 | ORAL_TABLET | Freq: Two times a day (BID) | ORAL | Status: DC

## 2015-04-13 MED ORDER — ONDANSETRON HCL 4 MG PO TABS: 4.0000 mg | ORAL_TABLET | Freq: Three times a day (TID) | ORAL | Status: DC | PRN

## 2015-04-13 MED ORDER — PREDNISONE 20 MG PO TABS: 20.0000 mg | ORAL_TABLET | Freq: Every day | ORAL | Status: DC

## 2015-04-13 MED ORDER — TRAMADOL HCL 50 MG PO TABS: 50.0000 mg | ORAL_TABLET | Freq: Three times a day (TID) | ORAL | Status: DC | PRN

## 2015-04-14 NOTE — Progress Notes (Signed)
Name: Lori Potts   MRN: [id number]    DOB: Dec 11, 1945   Date:04/14/2015       Progress Note  Subjective  Chief Complaint  Chief Complaint  Patient presents with  . Ear Fullness  . Anxiety     due to husbands dialysis and health condition    HPI  Bilateral otitis media  Complaint of bilateral ear pain and pressure for the past several days with low-grade fever. Hearing is been minimally effective.  Sinusitis  Complaint of bilateral nasal turbinate swelling with pain as well. No documented fever. She's had some mild chills.  Migraine headache  Recent exacerbation of the frequency and severity of migraine headaches. Ultram is working effectively on many occasions without requiring parenteral treatment. Headaches are precipitated by stress as well as by upper respiratory infections.  Is accompanied by nausea as well as vomiting and photophobia.  Anxiety  Patient is an increasing anxiety and recent due to her husband's chronic kidney disease now requiring chronic hemodialysis. She feels that is her caregiver she has little or no relief.  Past Medical History  Diagnosis Date  . Arthritis   . Hyperlipidemia     takes Lipitor daily  . Essential hypertension, benign     takes Lisinopril-HCTZ daily  . Unspecified essential hypertension   . Hypertension   . Vitamin D deficiency     takes Vit D weekly  . Seasonal allergies     takes Singulair daily as needed  . History of migraine     last one 2-3 months ago  . Weakness     numbness and tingling left arm  . Joint pain   . Low back pain   . History of colon polyps     benign  . Nocturia   . Type II or unspecified type diabetes mellitus without mention of complication, not stated as uncontrolled     Levemir nightly;average fasting blood sugar 125-140  . Diabetes mellitus without complication   . Cataract     right eye but immature  . History of shingles     Social History  Substance Use Topics  . Smoking  status: Former Smoker    Types: Cigarettes  . Smokeless tobacco: Never Used     Comment: quit smoking 84yrs ago  . Alcohol Use: No     Current outpatient prescriptions:  .  albuterol (PROVENTIL HFA;VENTOLIN HFA) 108 (90 BASE) MCG/ACT inhaler, Inhale 2 puffs into the lungs every 6 (six) hours as needed (for wheezing and shortness of breath from seasonal allergies)., Disp: , Rfl:  .  amoxicillin-clavulanate (AUGMENTIN) 875-125 MG per tablet, Take 1 tablet by mouth 2 (two) times daily., Disp: 20 tablet, Rfl: 0 .  atorvastatin (LIPITOR) 40 MG tablet, Take 40 mg by mouth daily., Disp: , Rfl:  .  docusate sodium (COLACE) 100 MG capsule, Take 1 capsule (100 mg total) by mouth 3 (three) times daily as needed for constipation. (Patient not taking: Reported on 02/20/2015), Disp: 30 capsule, Rfl: 0 .  Insulin Detemir (LEVEMIR FLEXTOUCH) 100 UNIT/ML Pen, Inject 15 Units into the skin at bedtime., Disp: , Rfl:  .  Insulin Pen Needle (BD ULTRA-FINE PEN NEEDLES) 29G X 12.7MM MISC, 1 pen by Does not apply route once., Disp: 100 each, Rfl: 3 .  lisinopril-hydrochlorothiazide (PRINZIDE,ZESTORETIC) 20-25 MG per tablet, Take 1 tablet by mouth daily., Disp: , Rfl:  .  methocarbamol (ROBAXIN) 500 MG tablet, Take 1 tablet (500 mg total) by mouth 3 (three) times daily  as needed for muscle spasms., Disp: 60 tablet, Rfl: 0 .  montelukast (SINGULAIR) 10 MG tablet, TAKE 1 TABLET BY MOUTH EVERY DAY, Disp: 30 tablet, Rfl: 0 .  Multiple Vitamin (MULTIVITAMIN WITH MINERALS) TABS tablet, Take 1 tablet by mouth daily at 12 noon., Disp: , Rfl:  .  ondansetron (ZOFRAN) 4 MG tablet, Take 1 tablet (4 mg total) by mouth every 8 (eight) hours as needed for nausea or vomiting., Disp: 20 tablet, Rfl: 0 .  oxyCODONE-acetaminophen (PERCOCET) 10-325 MG per tablet, Take 1 tablet by mouth every 4 (four) hours as needed for pain., Disp: 60 tablet, Rfl: 0 .  polyethylene glycol powder (GLYCOLAX) powder, Take 17 g by mouth daily. (Patient not  taking: Reported on 02/20/2015), Disp: 255 g, Rfl: 1 .  predniSONE (DELTASONE) 20 MG tablet, Take 1 tablet (20 mg total) by mouth daily with breakfast., Disp: 10 tablet, Rfl: 0 .  traMADol (ULTRAM) 50 MG tablet, Take 1 tablet (50 mg total) by mouth every 8 (eight) hours as needed., Disp: 60 tablet, Rfl: 0 .  Vitamin D, Ergocalciferol, (DRISDOL) 50000 UNITS CAPS capsule, Take 1 capsule (50,000 Units total) by mouth every 30 (thirty) days. On the 1st of each month, Disp: 30 capsule, Rfl: 2  Allergies  Allergen Reactions  . Adhesive [Tape] Rash    Regular tape is ok, allergy is to paper tape    Review of Systems  Constitutional: Positive for chills. Negative for fever and weight loss.  HENT: Positive for ear pain and hearing loss. Negative for congestion, sore throat and tinnitus.   Eyes: Negative for blurred vision, double vision and redness.  Respiratory: Positive for cough. Negative for hemoptysis and shortness of breath.   Cardiovascular: Negative for chest pain, palpitations, orthopnea, claudication and leg swelling.  Gastrointestinal: Negative for heartburn, nausea, vomiting, diarrhea, constipation and blood in stool.  Genitourinary: Negative for dysuria, urgency, frequency and hematuria.  Musculoskeletal: Negative for myalgias, back pain, joint pain, falls and neck pain.  Skin: Negative for itching.  Neurological: Positive for headaches. Negative for dizziness, tingling, tremors, focal weakness, seizures, loss of consciousness and weakness.  Endo/Heme/Allergies: Does not bruise/bleed easily.  Psychiatric/Behavioral: Negative for depression and substance abuse. The patient is nervous/anxious and has insomnia.      Objective  Filed Vitals:   04/13/15 1044  BP: 110/60  Pulse: 85  Temp: 98.3 F (36.8 C)  TempSrc: Oral  Resp: 16  Height: 5\' 6"  (1.676 m)  Weight: 181 lb 14.4 oz (82.509 kg)  SpO2: 96%     Physical Exam  Constitutional: She is oriented to person, place, and time  and well-developed, well-nourished, and in no distress.  HENT:  Bilateral tympanic membrane redness bulging. Nasal passages show significant swelling with mucopurulent discharge present Tender over the maxillary sinuses to palpation  Eyes: EOM are normal. Pupils are equal, round, and reactive to light.  Neck: No thyromegaly present.  Cardiovascular: Normal rate, regular rhythm and normal heart sounds.   No murmur heard. Pulmonary/Chest: Effort normal and breath sounds normal.  Abdominal: Soft. Bowel sounds are normal.  Musculoskeletal: Normal range of motion. She exhibits no edema.  Lymphadenopathy:    She has cervical adenopathy.  Neurological: She is alert and oriented to person, place, and time. No cranial nerve deficit. Gait normal.  Skin: Skin is warm and dry. No rash noted.  Psychiatric: Memory and affect normal.      Assessment & Plan  1. Bilateral acute serous otitis media, recurrence not specified Moderate in  severity  2. Subacute maxillary sinusitis Moderate  3. FHx: migraine headaches Worsening with stress stressors  4. Chronic pain Renew Ultram  5. Anxiety  Related particularly to husbands to onset of dialysis secondary to chronic renal failure

## 2015-04-22 ENCOUNTER — Telehealth: Payer: Self-pay | Admitting: Family Medicine

## 2015-04-22 NOTE — Telephone Encounter (Signed)
Patient is requesting something for energy. Please send to [facility]

## 2015-04-23 DIAGNOSIS — M5012 Cervical disc disorder with radiculopathy, mid-cervical region: Secondary | ICD-10-CM | POA: Diagnosis not present

## 2015-04-23 DIAGNOSIS — G894 Chronic pain syndrome: Secondary | ICD-10-CM | POA: Diagnosis not present

## 2015-04-23 DIAGNOSIS — Z79891 Long term (current) use of opiate analgesic: Secondary | ICD-10-CM | POA: Diagnosis not present

## 2015-04-23 DIAGNOSIS — M961 Postlaminectomy syndrome, not elsewhere classified: Secondary | ICD-10-CM | POA: Diagnosis not present

## 2015-04-23 NOTE — Telephone Encounter (Signed)
Patient informed. 

## 2015-04-23 NOTE — Telephone Encounter (Signed)
otc MULTIVITAMIN WITH IRON

## 2015-05-07 ENCOUNTER — Ambulatory Visit (INDEPENDENT_AMBULATORY_CARE_PROVIDER_SITE_OTHER): Payer: Commercial Managed Care - HMO

## 2015-05-07 DIAGNOSIS — G43101 Migraine with aura, not intractable, with status migrainosus: Secondary | ICD-10-CM | POA: Diagnosis not present

## 2015-05-07 MED ORDER — KETOROLAC TROMETHAMINE 60 MG/2ML IM SOLN
60.0000 mg | Freq: Once | INTRAMUSCULAR | Status: AC
  Administered 2015-05-07: 60 mg via INTRAMUSCULAR

## 2015-05-14 DIAGNOSIS — Z961 Presence of intraocular lens: Secondary | ICD-10-CM | POA: Diagnosis not present

## 2015-05-14 DIAGNOSIS — Z981 Arthrodesis status: Secondary | ICD-10-CM | POA: Diagnosis not present

## 2015-05-19 ENCOUNTER — Other Ambulatory Visit: Payer: Self-pay | Admitting: Emergency Medicine

## 2015-05-19 MED ORDER — IBUPROFEN 800 MG PO TABS: 800.0000 mg | ORAL_TABLET | Freq: Three times a day (TID) | ORAL | Status: DC | PRN

## 2015-06-02 ENCOUNTER — Ambulatory Visit: Payer: Commercial Managed Care - HMO | Admitting: Family Medicine

## 2015-06-02 DIAGNOSIS — E1169 Type 2 diabetes mellitus with other specified complication: Secondary | ICD-10-CM

## 2015-06-02 LAB — POCT GLYCOSYLATED HEMOGLOBIN (HGB A1C): Hemoglobin A1C: 7.1

## 2015-06-02 NOTE — Progress Notes (Signed)
Name: Lori Potts   MRN: [id number]    DOB: Oct 03, 1945   Date:06/02/2015       Progress Note  Subjective  Chief Complaint  Chief Complaint  Patient presents with  . Diabetes    Need A1c check for surgery    HPI  Patient presents for A1c checked prior to surgery  Past Medical History  Diagnosis Date  . Arthritis   . Hyperlipidemia     takes Lipitor daily  . Essential hypertension, benign     takes Lisinopril-HCTZ daily  . Unspecified essential hypertension   . Hypertension   . Vitamin D deficiency     takes Vit D weekly  . Seasonal allergies     takes Singulair daily as needed  . History of migraine     last one 2-3 months ago  . Weakness     numbness and tingling left arm  . Joint pain   . Low back pain   . History of colon polyps     benign  . Nocturia   . Type II or unspecified type diabetes mellitus without mention of complication, not stated as uncontrolled     Levemir nightly;average fasting blood sugar 125-140  . Diabetes mellitus without complication   . Cataract     right eye but immature  . History of shingles     Social History  Substance Use Topics  . Smoking status: Former Smoker    Types: Cigarettes  . Smokeless tobacco: Never Used     Comment: quit smoking 31yrs ago  . Alcohol Use: No     Current outpatient prescriptions:  .  albuterol (PROVENTIL HFA;VENTOLIN HFA) 108 (90 BASE) MCG/ACT inhaler, Inhale 2 puffs into the lungs every 6 (six) hours as needed (for wheezing and shortness of breath from seasonal allergies)., Disp: , Rfl:  .  amoxicillin-clavulanate (AUGMENTIN) 875-125 MG per tablet, Take 1 tablet by mouth 2 (two) times daily., Disp: 20 tablet, Rfl: 0 .  atorvastatin (LIPITOR) 40 MG tablet, Take 40 mg by mouth daily., Disp: , Rfl:  .  docusate sodium (COLACE) 100 MG capsule, Take 1 capsule (100 mg total) by mouth 3 (three) times daily as needed for constipation. (Patient not taking: Reported on 02/20/2015), Disp: 30 capsule, Rfl:  0 .  ibuprofen (ADVIL,MOTRIN) 800 MG tablet, Take 1 tablet (800 mg total) by mouth every 8 (eight) hours as needed., Disp: 90 tablet, Rfl: 1 .  Insulin Detemir (LEVEMIR FLEXTOUCH) 100 UNIT/ML Pen, Inject 15 Units into the skin at bedtime., Disp: , Rfl:  .  Insulin Pen Needle (BD ULTRA-FINE PEN NEEDLES) 29G X 12.7MM MISC, 1 pen by Does not apply route once., Disp: 100 each, Rfl: 3 .  lisinopril-hydrochlorothiazide (PRINZIDE,ZESTORETIC) 20-25 MG per tablet, Take 1 tablet by mouth daily., Disp: , Rfl:  .  methocarbamol (ROBAXIN) 500 MG tablet, Take 1 tablet (500 mg total) by mouth 3 (three) times daily as needed for muscle spasms., Disp: 60 tablet, Rfl: 0 .  montelukast (SINGULAIR) 10 MG tablet, TAKE 1 TABLET BY MOUTH EVERY DAY, Disp: 30 tablet, Rfl: 0 .  Multiple Vitamin (MULTIVITAMIN WITH MINERALS) TABS tablet, Take 1 tablet by mouth daily at 12 noon., Disp: , Rfl:  .  ondansetron (ZOFRAN) 4 MG tablet, Take 1 tablet (4 mg total) by mouth every 8 (eight) hours as needed for nausea or vomiting., Disp: 20 tablet, Rfl: 0 .  oxyCODONE-acetaminophen (PERCOCET) 10-325 MG per tablet, Take 1 tablet by mouth every 4 (four) hours as  needed for pain., Disp: 60 tablet, Rfl: 0 .  polyethylene glycol powder (GLYCOLAX) powder, Take 17 g by mouth daily. (Patient not taking: Reported on 02/20/2015), Disp: 255 g, Rfl: 1 .  predniSONE (DELTASONE) 20 MG tablet, Take 1 tablet (20 mg total) by mouth daily with breakfast., Disp: 10 tablet, Rfl: 0 .  traMADol (ULTRAM) 50 MG tablet, Take 1 tablet (50 mg total) by mouth every 8 (eight) hours as needed., Disp: 60 tablet, Rfl: 0 .  Vitamin D, Ergocalciferol, (DRISDOL) 50000 UNITS CAPS capsule, Take 1 capsule (50,000 Units total) by mouth every 30 (thirty) days. On the 1st of each month, Disp: 30 capsule, Rfl: 2  Allergies  Allergen Reactions  . Adhesive [Tape] Rash    Regular tape is ok, allergy is to paper tape    ROS   Objective  There were no vitals filed for this  visit.   Physical Exam    Assessment & Plan

## 2015-06-10 ENCOUNTER — Ambulatory Visit (INDEPENDENT_AMBULATORY_CARE_PROVIDER_SITE_OTHER): Payer: Commercial Managed Care - HMO

## 2015-06-10 DIAGNOSIS — Z23 Encounter for immunization: Secondary | ICD-10-CM | POA: Diagnosis not present

## 2015-06-15 ENCOUNTER — Ambulatory Visit: Payer: Commercial Managed Care - HMO | Admitting: Family Medicine

## 2015-06-16 ENCOUNTER — Ambulatory Visit (INDEPENDENT_AMBULATORY_CARE_PROVIDER_SITE_OTHER): Payer: Commercial Managed Care - HMO | Admitting: Family Medicine

## 2015-06-16 ENCOUNTER — Encounter: Payer: Self-pay | Admitting: Family Medicine

## 2015-06-16 VITALS — BP 128/68 | HR 84 | Temp 98.2°F | Resp 16 | Ht 66.0 in | Wt 184.0 lb

## 2015-06-16 DIAGNOSIS — E1169 Type 2 diabetes mellitus with other specified complication: Secondary | ICD-10-CM | POA: Insufficient documentation

## 2015-06-16 DIAGNOSIS — J0100 Acute maxillary sinusitis, unspecified: Secondary | ICD-10-CM

## 2015-06-16 DIAGNOSIS — J40 Bronchitis, not specified as acute or chronic: Secondary | ICD-10-CM

## 2015-06-16 DIAGNOSIS — E119 Type 2 diabetes mellitus without complications: Secondary | ICD-10-CM | POA: Insufficient documentation

## 2015-06-16 DIAGNOSIS — M5441 Lumbago with sciatica, right side: Secondary | ICD-10-CM

## 2015-06-16 DIAGNOSIS — M25551 Pain in right hip: Secondary | ICD-10-CM | POA: Diagnosis not present

## 2015-06-16 DIAGNOSIS — E7800 Pure hypercholesterolemia, unspecified: Secondary | ICD-10-CM | POA: Insufficient documentation

## 2015-06-16 DIAGNOSIS — E1129 Type 2 diabetes mellitus with other diabetic kidney complication: Secondary | ICD-10-CM | POA: Insufficient documentation

## 2015-06-16 MED ORDER — AMOXICILLIN-POT CLAVULANATE 875-125 MG PO TABS: 1.0000 | ORAL_TABLET | Freq: Two times a day (BID) | ORAL | Status: DC

## 2015-06-16 MED ORDER — GABAPENTIN 300 MG PO CAPS: 300.0000 mg | ORAL_CAPSULE | Freq: Three times a day (TID) | ORAL | Status: DC

## 2015-06-16 MED ORDER — ONDANSETRON HCL 4 MG PO TABS: 4.0000 mg | ORAL_TABLET | Freq: Three times a day (TID) | ORAL | Status: DC | PRN

## 2015-06-16 NOTE — Progress Notes (Signed)
Name: Lori Potts   MRN: [id number]    DOB: July 24, 1946   Date:06/16/2015       Progress Note  Subjective  Chief Complaint  Chief Complaint  Patient presents with  . Hip Pain    that radiates toward abdominal area for 4 days  . Sinus Problem    HPI  Sinusitis  Patient presents with greater than 7 day history of nasal congestion and drainage which is purulent in color. There is tenderness over the sinuses. There has been fever to none along with some associated chills on occasion. Usage of over-the-counter medications is not been affected. There is also accompanying cough productive of purulent sputum.  Bronchitis  Patient presents with a greater than 1 week history of cough productive of purulent sputum. The cough is irritating and keep the patient awake at night. There has no fever or chills.  Over-the-counter meds And completely effective.  Diabetes follow-up.  Patient currently on Levemir. Last A1c was 7.1. Her surgery was 6.9 before doing her knee surgery  Hip pain and back pain.  Patient is awaiting a right knee surgery. [place] walking she isn't experiencing severe right hip and lower back pain. She is already had 4 lumbar disc surgeries. She states the Percocet is ineffective in relieving her pain    Past Medical History  Diagnosis Date  . Arthritis   . Hyperlipidemia     takes Lipitor daily  . Essential hypertension, benign     takes Lisinopril-HCTZ daily  . Unspecified essential hypertension   . Hypertension   . Vitamin D deficiency     takes Vit D weekly  . Seasonal allergies     takes Singulair daily as needed  . History of migraine     last one 2-3 months ago  . Weakness     numbness and tingling left arm  . Joint pain   . Low back pain   . History of colon polyps     benign  . Nocturia   . Type II or unspecified type diabetes mellitus without mention of complication, not stated as uncontrolled     Levemir nightly;average fasting blood sugar 125-140  . Diabetes mellitus without complication (HCC)   . Cataract     right eye but immature  . History of shingles     Social History  Substance Use Topics  . Smoking status: Former Smoker    Types: Cigarettes  . Smokeless tobacco: Never Used     Comment: quit smoking 73yrs ago  . Alcohol Use: No     Current outpatient prescriptions:  .  ACCU-CHEK SMARTVIEW test strip, , Disp: , Rfl:  .  albuterol (PROVENTIL HFA;VENTOLIN HFA) 108 (90 BASE) MCG/ACT inhaler, Inhale 2 puffs into the lungs every 6 (six) hours as needed (for wheezing and shortness of breath from seasonal allergies)., Disp: , Rfl:  .  amoxicillin-clavulanate (AUGMENTIN) 875-125 MG per tablet, Take 1 tablet by mouth 2 (two) times daily., Disp: 20 tablet, Rfl: 0 .  atorvastatin (LIPITOR) 40 MG tablet, Take 40 mg by mouth daily., Disp: , Rfl:  .  docusate sodium (COLACE) 100 MG capsule, Take 1 capsule (100 mg total) by mouth 3 (three) times daily as needed for constipation. (Patient not taking: Reported on 02/20/2015), Disp: 30 capsule, Rfl: 0 .  ibuprofen (ADVIL,MOTRIN) 800 MG tablet, Take 1 tablet (800 mg total) by mouth every 8 (eight) hours as needed., Disp: 90 tablet, Rfl:  1 .  Insulin Detemir (LEVEMIR FLEXTOUCH) 100 UNIT/ML Pen, Inject 15 Units into the skin at bedtime., Disp: , Rfl:  .  Insulin Pen Needle (BD ULTRA-FINE PEN NEEDLES) 29G X 12.7MM MISC, 1 pen by Does not apply route once., Disp: 100 each, Rfl: 3 .  lisinopril-hydrochlorothiazide (PRINZIDE,ZESTORETIC) 20-25 MG per tablet, Take 1 tablet by mouth daily., Disp: , Rfl:  .  methocarbamol (ROBAXIN) 500 MG tablet, Take 1 tablet (500 mg total) by mouth 3 (three) times daily as needed for muscle spasms., Disp: 60 tablet, Rfl: 0 .  montelukast (SINGULAIR) 10 MG tablet, TAKE 1 TABLET BY MOUTH EVERY DAY, Disp: 30 tablet, Rfl: 0 .  Multiple Vitamin (MULTIVITAMIN WITH MINERALS) TABS tablet, Take 1 tablet by mouth daily at 12  noon., Disp: , Rfl:  .  ondansetron (ZOFRAN) 4 MG tablet, Take 1 tablet (4 mg total) by mouth every 8 (eight) hours as needed for nausea or vomiting., Disp: 20 tablet, Rfl: 0 .  oxyCODONE-acetaminophen (PERCOCET) 10-325 MG per tablet, Take 1 tablet by mouth every 4 (four) hours as needed for pain., Disp: 60 tablet, Rfl: 0 .  polyethylene glycol powder (GLYCOLAX) powder, Take 17 g by mouth daily. (Patient not taking: Reported on 02/20/2015), Disp: 255 g, Rfl: 1 .  predniSONE (DELTASONE) 20 MG tablet, Take 1 tablet (20 mg total) by mouth daily with breakfast., Disp: 10 tablet, Rfl: 0 .  traMADol (ULTRAM) 50 MG tablet, Take 1 tablet (50 mg total) by mouth every 8 (eight) hours as needed., Disp: 60 tablet, Rfl: 0 .  Vitamin D, Ergocalciferol, (DRISDOL) 50000 UNITS CAPS capsule, Take 1 capsule (50,000 Units total) by mouth every 30 (thirty) days. On the 1st of each month, Disp: 30 capsule, Rfl: 2  Allergies  Allergen Reactions  . Adhesive [Tape] Rash    Regular tape is ok, allergy is to paper tape    Review of Systems  Constitutional: Negative for fever, chills and weight loss.  HENT: Positive for congestion. Negative for hearing loss, sore throat and tinnitus.   Eyes: Negative for blurred vision, double vision and redness.  Respiratory: Positive for sputum production. Negative for cough, hemoptysis and shortness of breath.   Cardiovascular: Negative for chest pain, palpitations, orthopnea, claudication and leg swelling.  Gastrointestinal: Negative for heartburn, nausea, vomiting, diarrhea, constipation and blood in stool.  Genitourinary: Negative for dysuria, urgency, frequency and hematuria.  Musculoskeletal: Positive for back pain and joint pain (hip pain). Negative for myalgias, falls and neck pain.  Skin: Negative for itching.  Neurological: Negative for dizziness, tingling, tremors, focal weakness, seizures, loss of consciousness, weakness and headaches.  Endo/Heme/Allergies: Does not  bruise/bleed easily.  Psychiatric/Behavioral: Negative for depression and substance abuse. The patient is not nervous/anxious and does not have insomnia.      Objective  Filed Vitals:   06/16/15 1353  BP: 128/68  Pulse: 84  Temp: 98.2 F (36.8 C)  Resp: 16  Height:  (1.676 m)  Weight: 184 lb (83.462 kg)  SpO2: 95%     Physical Exam  Constitutional: She is oriented to person, place, and time and well-developed, well-nourished, and in no distress.  HENT:  Head: Normocephalic.  Eyes: EOM are normal. Pupils are equal, round, and reactive to light.  There is tenderness over the frontal and maxillary sinuses.  Bilateral nasal turbinate swelling with purulent discharge  Neck: Normal range of motion. No thyromegaly present.  Cardiovascular: Normal rate, regular rhythm and normal heart sounds.   No murmur heard. Pulmonary/Chest:  Effort normal and breath sounds normal.  Abdominal: Soft. Bowel sounds are normal.  Musculoskeletal: She exhibits no edema.  Old midline surgical scar in the lumbar area tenderness around L4-5 which radiates to the right buttocks and right anterior hip area.  Straight leg raising is positive at 30).  Neurological: She is alert and oriented to person, place, and time. No cranial nerve deficit. Gait normal.  Skin: Skin is warm and dry. No rash noted.  Psychiatric: Memory and affect normal.      Assessment & Plan  1. Hip pain, acute, right Likely etiologies include right hip arthritis in view of favoring the right knee as well as exacerbation of her lumbar disc disease - gabapentin (NEURONTIN) 300 MG capsule; Take 1 capsule (300 mg total) by mouth 3 (three) times daily. Take 1 tab by mouth QHS for 1 week then 1 tab BID for 2nd week and TID thereafter  Dispense: 90 capsule; Refill: 0 - DG HIP UNILAT WITH PELVIS 2-3 VIEWS RIGHT; Future - DG Lumbar Spine Complete; Future  2. Acute maxillary sinusitis, recurrence not specified Moderate in severity  and recurrent - amoxicillin-clavulanate (AUGMENTIN) 875-125 MG tablet; Take 1 tablet by mouth 2 (two) times daily.  Dispense: 20 tablet; Refill: 0 - ondansetron (ZOFRAN) 4 MG tablet; Take 1 tablet (4 mg total) by mouth every 8 (eight) hours as needed for nausea or vomiting.  Dispense: 20 tablet; Refill: 0  3. Bronchitis Moderate in the current - amoxicillin-clavulanate (AUGMENTIN) 875-125 MG tablet; Take 1 tablet by mouth 2 (two) times daily.  Dispense: 20 tablet; Refill: 0  4. Midline low back pain with right-sided sciatica As above

## 2015-06-23 DIAGNOSIS — H524 Presbyopia: Secondary | ICD-10-CM | POA: Diagnosis not present

## 2015-06-23 DIAGNOSIS — H5210 Myopia, unspecified eye: Secondary | ICD-10-CM | POA: Diagnosis not present

## 2015-06-25 ENCOUNTER — Other Ambulatory Visit: Payer: Self-pay | Admitting: Family Medicine

## 2015-06-30 ENCOUNTER — Ambulatory Visit
Admission: EM | Admit: 2015-06-30 | Discharge: 2015-06-30 | Disposition: A | Discharge status: Home / Self Care | Payer: Commercial Managed Care - HMO | Attending: Family Medicine | Admitting: Family Medicine

## 2015-06-30 ENCOUNTER — Encounter: Payer: Self-pay | Admitting: Family Medicine

## 2015-06-30 ENCOUNTER — Ambulatory Visit (INDEPENDENT_AMBULATORY_CARE_PROVIDER_SITE_OTHER): Payer: Commercial Managed Care - HMO

## 2015-06-30 ENCOUNTER — Encounter: Payer: Self-pay | Admitting: Emergency Medicine

## 2015-06-30 DIAGNOSIS — G8929 Other chronic pain: Secondary | ICD-10-CM

## 2015-06-30 DIAGNOSIS — M5441 Lumbago with sciatica, right side: Secondary | ICD-10-CM

## 2015-06-30 DIAGNOSIS — M7061 Trochanteric bursitis, right hip: Secondary | ICD-10-CM | POA: Diagnosis not present

## 2015-06-30 DIAGNOSIS — M549 Dorsalgia, unspecified: Secondary | ICD-10-CM

## 2015-06-30 DIAGNOSIS — M25551 Pain in right hip: Secondary | ICD-10-CM | POA: Diagnosis not present

## 2015-06-30 MED ORDER — KETOROLAC TROMETHAMINE 60 MG/2ML IM SOLN
60.0000 mg | Freq: Once | INTRAMUSCULAR | Status: AC
  Administered 2015-06-30: 30 mg via INTRAMUSCULAR

## 2015-06-30 MED ORDER — NAPROXEN 500 MG PO TABS: 500.0000 mg | ORAL_TABLET | Freq: Two times a day (BID) | ORAL | Status: DC

## 2015-06-30 NOTE — ED Notes (Signed)
Patient c/o right sided lower back pain that goes down her right leg for about a week.  Patient states that she is in a pain clinic.

## 2015-06-30 NOTE — ED Provider Notes (Signed)
CSN: [id number]     Arrival date & time 06/30/15  1012 History   First MD Initiated Contact with Patient 06/30/15 1103     Chief Complaint  Patient presents with  . Back Pain   (Consider location/radiation/quality/duration/timing/severity/associated sxs/prior Treatment) HPI   Is a 69 year old female long complicated previous history including lumbar surgery with instrumentation cervical surgery on a low back pain type 2 diabetes ptotic pain under the care of a hand specialist. Presents with right-sided lower back and flank pain which indicates just above her superior iliac crest radiation into her right anterior thigh lateral thigh and buttock. She states that she has had kidney stones in the past and the low back pain but both of these do not seem the same as she usually has. She has no history of known injury. She states that it hurts to lie on her right side. This pain is been present about a week. She has not done any moving of furniture lifting bending long sitting for any time prior to that that she can remember. His had no fever or chills. He's had no urinary symptoms. There is some abdominal cramping with pain on the right but the history is not impressive for any pathological process.    Past Medical History  Diagnosis Date  . Arthritis   . Hyperlipidemia     takes Lipitor daily  . Essential hypertension, benign     takes Lisinopril-HCTZ daily  . Unspecified essential hypertension   . Hypertension   . Vitamin D deficiency     takes Vit D weekly  . Seasonal allergies     takes Singulair daily as needed  . History of migraine     last one 2-3 months ago  . Weakness     numbness and tingling left arm  . Joint pain   . Low back pain   . History of colon polyps     benign  . Nocturia   . Type II or unspecified type diabetes mellitus without mention of complication, not stated as uncontrolled     Levemir nightly;average fasting blood sugar 125-140  . Diabetes mellitus without  complication (HCC)   . Cataract     right eye but immature  . History of shingles    Past Surgical History  Procedure Laterality Date  . Back surgery    . Total knee arthroplasty Left   . Small bowel repair    . Kidney surgery  1998    growth removed from left kidney   . Joint replacement      left knee  . Abdominal hysterectomy    . Breast surgery      Augementation  . Back surgery      Lumbar fusion x 2  . Revision of scar tissue rectus muscle    . Pain stimulator    . Spinal cord stimulator battery exchange N/A 10/17/2012    Procedure: SPINAL CORD STIMULATOR BATTERY REMOVAL;  Surgeon: Venita Lickahari Brooks, MD;  Location: [facility] OR;  Service: Orthopedics;  Laterality: N/A;  . Anterior lat lumbar fusion N/A 03/21/2013    Procedure: ANTERIOR LATERAL LUMBAR FUSION 1 LEVEL/ XLIF L3-L4 ;  Surgeon: Venita Lickahari Brooks, MD;  Location: [facility] OR;  Service: Orthopedics;  Laterality: N/A;  . Posterior cervical fusion/foraminotomy Right 03/21/2013    Procedure: POSTERIOR L2-3 RIGHT FORAMINOTOMY;  Surgeon: Venita Lickahari Brooks, MD;  Location: [facility] OR;  Service: Orthopedics;  Laterality: Right;  . Cardiac catheterization  5/12    ef 55%  .  Cardiac catheterization  10/2010    [facility]; EF 55%  . Appendectomy    . Colonoscopy    . Esophagogastroduodenoscopy    . Anterior cervical decomp/discectomy fusion N/A 02/26/2015    Procedure: ANTERIOR CERVICAL DISCECTOMY FUSION C4-5 (1 LEVEL);  Surgeon: Venita Lick, MD;  Location: [facility] OR;  Service: Orthopedics;  Laterality: N/A;   History reviewed. No pertinent family history. Social History  Substance Use Topics  . Smoking status: Former Smoker    Types: Cigarettes  . Smokeless tobacco: Never Used     Comment: quit smoking 28yrs ago  . Alcohol Use: No   OB History    Gravida Para Term Preterm AB TAB SAB Ectopic Multiple Living   0 0 0 0 0 0 0 0       Review of Systems  Constitutional: Positive for activity change. Negative for fever, chills, diaphoresis, appetite change and  fatigue.  Musculoskeletal: Positive for myalgias and back pain.    Allergies  Adhesive  Home Medications   Prior to Admission medications   Medication Sig Start Date End Date Taking? Authorizing Provider  ACCU-CHEK SMARTVIEW test strip TEST BLOOD SUGARS 4 TIMES DAILY 06/25/15   Dennison Mascot, MD  albuterol (PROVENTIL HFA;VENTOLIN HFA) 108 (90 BASE) MCG/ACT inhaler Inhale 2 puffs into the lungs every 6 (six) hours as needed (for wheezing and shortness of breath from seasonal allergies).    Historical Provider, MD  amoxicillin-clavulanate (AUGMENTIN) 875-125 MG tablet Take 1 tablet by mouth 2 (two) times daily. 06/16/15   Dennison Mascot, MD  atorvastatin (LIPITOR) 40 MG tablet Take 40 mg by mouth daily.    Historical Provider, MD  docusate sodium (COLACE) 100 MG capsule Take 1 capsule (100 mg total) by mouth 3 (three) times daily as needed for constipation. Patient not taking: Reported on 02/20/2015 03/23/13   Venita Lick, MD  gabapentin (NEURONTIN) 300 MG capsule Take 1 capsule (300 mg total) by mouth 3 (three) times daily. Take 1 tab by mouth QHS for 1 week then 1 tab BID for 2nd week and TID thereafter 06/16/15   Dennison Mascot, MD  ibuprofen (ADVIL,MOTRIN) 800 MG tablet Take 1 tablet (800 mg total) by mouth every 8 (eight) hours as needed. 05/19/15   Dennison Mascot, MD  Insulin Detemir (LEVEMIR FLEXTOUCH) 100 UNIT/ML Pen Inject 15 Units into the skin at bedtime.    Historical Provider, MD  Insulin Pen Needle (BD ULTRA-FINE PEN NEEDLES) 29G X 12.7MM MISC 1 pen by Does not apply route once. 03/11/15   Dennison Mascot, MD  lisinopril-hydrochlorothiazide (PRINZIDE,ZESTORETIC) 20-25 MG per tablet Take 1 tablet by mouth daily.    Historical Provider, MD  methocarbamol (ROBAXIN) 500 MG tablet Take 1 tablet (500 mg total) by mouth 3 (three) times daily as needed for muscle spasms. 02/26/15   Venita Lick, MD  montelukast (SINGULAIR) 10 MG tablet TAKE 1 TABLET BY MOUTH EVERY DAY 03/20/15   Dennison Mascot, MD  Multiple Vitamin (MULTIVITAMIN WITH MINERALS) TABS tablet Take 1 tablet by mouth daily at 12 noon.    Historical Provider, MD  naproxen (NAPROSYN) 500 MG tablet Take 1 tablet (500 mg total) by mouth 2 (two) times daily with a meal. 06/30/15   Lutricia Feil, PA-C  ondansetron (ZOFRAN) 4 MG tablet Take 1 tablet (4 mg total) by mouth every 8 (eight) hours as needed for nausea or vomiting. 06/16/15   Dennison Mascot, MD  oxyCODONE-acetaminophen (PERCOCET) 10-325 MG per tablet Take 1 tablet by mouth every 4 (four) hours  as needed for pain. 02/26/15   Venita Lick, MD  polyethylene glycol powder (GLYCOLAX) powder Take 17 g by mouth daily. Patient not taking: Reported on 02/20/2015 03/22/13   Venita Lick, MD  predniSONE (DELTASONE) 20 MG tablet Take 1 tablet (20 mg total) by mouth daily with breakfast. 04/13/15   Dennison Mascot, MD  traMADol (ULTRAM) 50 MG tablet Take 1 tablet (50 mg total) by mouth every 8 (eight) hours as needed. 04/13/15   Dennison Mascot, MD  Vitamin D, Ergocalciferol, (DRISDOL) 50000 UNITS CAPS capsule Take 1 capsule (50,000 Units total) by mouth every 30 (thirty) days. On the 1st of each month 03/24/15   Dennison Mascot, MD   Meds Ordered and Administered this Visit   Medications  ketorolac (TORADOL) injection 60 mg (30 mg Intramuscular Given 06/30/15 1134)    BP 136/68 mmHg  Pulse 78  Temp(Src) 97.6 F (36.4 C) (Tympanic)  Resp 16  Ht 5\' 3"  (1.6 m)  Wt 184 lb (83.462 kg)  BMI 32.60 kg/m2  SpO2 100% No data found.   Physical Exam  Constitutional: She appears well-developed and well-nourished. No distress.  HENT:  Head: Normocephalic and atraumatic.  Mouth/Throat: Oropharynx is clear and moist.  Eyes: Pupils are equal, round, and reactive to light.  Neck: Neck supple.  Abdominal: Soft. Bowel sounds are normal. She exhibits no distension. There is no tenderness. There is no rebound and no guarding.  Musculoskeletal:  Exam nation of the lumbar spine shows a  well-healed midline incision standing from L3-S1. There is no paraspinous muscle tenderness. She is tender in the right lateral muscles extending into the anterior superior iliac crest and also of the  sacral iliac joint. Hip range of motion is comfortable. Pelvis is level stance. With for flexion there is a right takeoff of the lumbar spine. She has very limited range of motion along her hands only to the level of her mid thighs. Lateral flexion to the left and to the right both reproduce pain but this is more prominent on the right. Patient is able to heel walk and toe walk without difficulty. Sensation to the lower extremities is intact to light touch. EHL peroneal and J tibialis are all strong to clinical testing. Raise testing is negative on the left and positive at 85-90 on the right in the sitting position. Operation along the lateral femur shows significant tenderness to palpation over the greater trochanteric area causing the patient to come off the table. Is no induration or crepitus appreciated.  Lymphadenopathy:    She has no cervical adenopathy.  Skin: Skin is warm and dry. She is not diaphoretic.  Psychiatric: She has a normal mood and affect. Her behavior is normal. Judgment and thought content normal.  Nursing note and vitals reviewed.   ED Course  Procedures (including critical care time)  Labs Review Labs Reviewed - No data to display  Imaging Review Dg Hip Unilat With Pelvis 2-3 Views Right  06/30/2015  CLINICAL DATA:  Low back pain extending to the right anterior hip and groin area for 1 week. EXAM: DG HIP (WITH OR WITHOUT PELVIS) 2-3V RIGHT COMPARISON:  None. FINDINGS: The right hip is located. No acute bone or soft tissue abnormalities are present. Fusion of the lower lumbar spine is noted. IMPRESSION: Negative right hip radiographs. Lumbar spine surgery. Electronically Signed   By: Marin Roberts M.D.   On: 06/30/2015 12:11     Visual Acuity Review  Right Eye  Distance:   Left Eye Distance:  Bilateral Distance:    Right Eye Near:   Left Eye Near:    Bilateral Near:    11:34 Medication Given JA  ketorolac (TORADOL) injection 60 mg - Dose: 30 mg ; Route: Intramuscular ; Site: Left Upper Outer Quadrant ; Scheduled Time: 1130         MDM   1. Greater trochanteric bursitis of right hip   2. Back pain   3. Chronic right-sided low back pain with right-sided sciatica    Discharge Medication List as of 06/30/2015 12:32 PM    START taking these medications   Details  naproxen (NAPROSYN) 500 MG tablet Take 1 tablet (500 mg total) by mouth 2 (two) times daily with a meal., Starting 06/30/2015, Until Discontinued, Print       Plan: 1. Test/x-ray results and diagnosis reviewed with patient 2. rx as per orders; risks, benefits, potential side effects reviewed with patient 3. Recommend supportive treatment with avoidance of lying on that side. Heat and ice as necessary. 4. F/u prn if symptoms worsen or don't improve    Lutricia Feil, PA-C 06/30/15 1516

## 2015-06-30 NOTE — Discharge Instructions (Signed)
Chronic Back Pain  When back pain lasts longer than 3 months, it is called chronic back pain.People with chronic back pain often go through certain periods that are more intense (flare-ups).  CAUSES Chronic back pain can be caused by wear and tear (degeneration) on different structures in your back. These structures include:  The bones of your spine (vertebrae) and the joints surrounding your spinal cord and nerve roots (facets).  The strong, fibrous tissues that connect your vertebrae (ligaments). Degeneration of these structures may result in pressure on your nerves. This can lead to constant pain. HOME CARE INSTRUCTIONS  Avoid bending, heavy lifting, prolonged sitting, and activities which make the problem worse.  Take brief periods of rest throughout the day to reduce your pain. Lying down or standing usually is better than sitting while you are resting.  Take over-the-counter or prescription medicines only as directed by your caregiver. SEEK IMMEDIATE MEDICAL CARE IF:   You have weakness or numbness in one of your legs or feet.  You have trouble controlling your bladder or bowels.  You have nausea, vomiting, abdominal pain, shortness of breath, or fainting.   This information is not intended to replace advice given to you by your health care provider. Make sure you discuss any questions you have with your health care provider.   Document Released: 09/15/2004 Document Revised: 10/31/2011 Document Reviewed: 01/26/2015 Elsevier Interactive Patient Education 2016 Elsevier Inc.  Bursitis Bursitis is inflammation and irritation of a bursa, which is one of the small, fluid-filled sacs that cushion and protect the moving parts of your body. These sacs are located between bones and muscles, muscle attachments, or skin areas next to bones. A bursa protects these structures from the wear and tear that results from frequent movement. An inflamed bursa causes pain and swelling. Fluid may  build up inside the sac. Bursitis is most common near joints, especially the knees, elbows, hips, and shoulders. CAUSES Bursitis can be caused by:   Injury from:  A direct blow, like falling on your knee or elbow.  Overuse of a joint (repetitive stress).  Infection. This can happen if bacteria gets into a bursa through a cut or scrape near a joint.  Diseases that cause joint inflammation, such as gout and rheumatoid arthritis. RISK FACTORS You may be at risk for bursitis if you:   Have a job or hobby that involves a lot of repetitive stress on your joints.  Have a condition that weakens your body's defense system (immune system), such as diabetes, cancer, or HIV.  Lift and reach overhead often.  Kneel or lean on hard surfaces often.  Run or walk often. SIGNS AND SYMPTOMS The most common signs and symptoms of bursitis are:  Pain that gets worse when you move the affected body part or put weight on it.  Inflammation.  Stiffness. Other signs and symptoms may include:  Redness.  Tenderness.  Warmth.  Pain that continues after rest.  Fever and chills. This may occur in bursitis caused by infection. DIAGNOSIS Bursitis may be diagnosed by:   Medical history and physical exam.  MRI.  A procedure to drain fluid from the bursa with a needle (aspiration). The fluid may be checked for signs of infection or gout.  Blood tests to rule out other causes of inflammation. TREATMENT  Bursitis can usually be treated at home with rest, ice, compression, and elevation (RICE). For mild bursitis, RICE treatment may be all you need. Other treatments may include:  Nonsteroidal anti-inflammatory drugs (  NSAIDs) to treat pain and inflammation.  Corticosteroids to fight inflammation. You may have these drugs injected into and around the area of bursitis.  Aspiration of bursitis fluid to relieve pain and improve movement.  Antibiotic medicine to treat an infected bursa.  A splint,  brace, or walking aid.  Physical therapy if you continue to have pain or limited movement.  Surgery to remove a damaged or infected bursa. This may be needed if you have a very bad case of bursitis or if other treatments have not worked. HOME CARE INSTRUCTIONS   Take medicines only as directed by your health care provider.  If you were prescribed an antibiotic medicine, finish it all even if you start to feel better.  Rest the affected area as directed by your health care provider.  Keep the area elevated.  Avoid activities that make pain worse.  Apply ice to the injured area:  Place ice in a plastic bag.  Place a towel between your skin and the bag.  Leave the ice on for 20 minutes, 2-3 times a day.  Use splints, braces, pads, or walking aids as directed by your health care provider.  Keep all follow-up visits as directed by your health care provider. This is important. PREVENTION   Wear knee pads if you kneel often.  Wear sturdy running or walking shoes that fit you well.  Take regular breaks from repetitive activity.  Warm up by stretching before doing any strenuous activity.  Maintain a healthy weight or lose weight as recommended by your health care provider. Ask your health care provider if you need help.  Exercise regularly. Start any new physical activity gradually. SEEK MEDICAL CARE IF:   Your bursitis is not responding to treatment or home care.  You have a fever.  You have chills.   This information is not intended to replace advice given to you by your health care provider. Make sure you discuss any questions you have with your health care provider.   Document Released: 08/05/2000 Document Revised: 04/29/2015 Document Reviewed: 10/28/2013 Elsevier Interactive Patient Education [employer]2016 Elsevier Inc.

## 2015-07-09 ENCOUNTER — Telehealth: Payer: Self-pay | Admitting: Family Medicine

## 2015-07-09 NOTE — Telephone Encounter (Signed)
Completely out of lipitor, vitamin d and cough medication. Please send to [facility]

## 2015-07-09 NOTE — Telephone Encounter (Signed)
Ok and print out a tussionex rf

## 2015-07-09 NOTE — Telephone Encounter (Signed)
Requesting refills on Tussinex.

## 2015-07-10 MED ORDER — HYDROCOD POLST-CPM POLST ER 10-8 MG/5ML PO SUER
5.0000 mL | Freq: Two times a day (BID) | ORAL | Status: DC | PRN
Start: 2015-07-10 — End: 2015-08-25

## 2015-07-10 MED ORDER — VITAMIN D (ERGOCALCIFEROL) 1.25 MG (50000 UNIT) PO CAPS: 50000.0000 [IU] | ORAL_CAPSULE | ORAL | Status: DC

## 2015-07-10 MED ORDER — ATORVASTATIN CALCIUM 40 MG PO TABS: 40.0000 mg | ORAL_TABLET | Freq: Every day | ORAL | Status: DC

## 2015-07-10 NOTE — Telephone Encounter (Signed)
Patient notified to pick up 

## 2015-07-11 ENCOUNTER — Other Ambulatory Visit: Payer: Self-pay | Admitting: Family Medicine

## 2015-07-13 DIAGNOSIS — Z79891 Long term (current) use of opiate analgesic: Secondary | ICD-10-CM | POA: Diagnosis not present

## 2015-07-13 DIAGNOSIS — M50122 Cervical disc disorder at C5-C6 level with radiculopathy: Secondary | ICD-10-CM | POA: Diagnosis not present

## 2015-07-13 DIAGNOSIS — G894 Chronic pain syndrome: Secondary | ICD-10-CM | POA: Diagnosis not present

## 2015-07-13 DIAGNOSIS — M961 Postlaminectomy syndrome, not elsewhere classified: Secondary | ICD-10-CM | POA: Diagnosis not present

## 2015-07-21 ENCOUNTER — Ambulatory Visit: Payer: Self-pay | Admitting: Physician Assistant

## 2015-07-21 ENCOUNTER — Encounter (HOSPITAL_COMMUNITY): Payer: Self-pay | Admitting: *Deleted

## 2015-07-21 MED ORDER — CEFAZOLIN SODIUM-DEXTROSE 2-3 GM-% IV SOLR: 2.0000 g | INTRAVENOUS | Status: DC

## 2015-07-21 NOTE — Progress Notes (Signed)
I instructed patient to decrease Levemir to 12 units tonight at bedtime.  Patient repeated instructions.

## 2015-07-22 ENCOUNTER — Ambulatory Visit (HOSPITAL_COMMUNITY)
Admission: RE | Admit: 2015-07-22 | Discharge: 2015-07-22 | Disposition: A | Discharge status: Home / Self Care | Payer: Commercial Managed Care - HMO | Source: Ambulatory Visit | Attending: Orthopedic Surgery | Admitting: Orthopedic Surgery

## 2015-07-22 ENCOUNTER — Ambulatory Visit (HOSPITAL_COMMUNITY): Payer: Commercial Managed Care - HMO | Admitting: Certified Registered"

## 2015-07-22 ENCOUNTER — Encounter (HOSPITAL_COMMUNITY)
Admission: RE | Disposition: A | Discharge status: Home / Self Care | Payer: Self-pay | Source: Ambulatory Visit | Attending: Orthopedic Surgery

## 2015-07-22 ENCOUNTER — Encounter (HOSPITAL_COMMUNITY): Payer: Self-pay | Admitting: *Deleted

## 2015-07-22 ENCOUNTER — Ambulatory Visit (HOSPITAL_COMMUNITY): Payer: Commercial Managed Care - HMO

## 2015-07-22 DIAGNOSIS — M545 Low back pain: Secondary | ICD-10-CM | POA: Diagnosis not present

## 2015-07-22 DIAGNOSIS — G8929 Other chronic pain: Secondary | ICD-10-CM | POA: Insufficient documentation

## 2015-07-22 DIAGNOSIS — Z96652 Presence of left artificial knee joint: Secondary | ICD-10-CM | POA: Diagnosis not present

## 2015-07-22 DIAGNOSIS — Z794 Long term (current) use of insulin: Secondary | ICD-10-CM | POA: Diagnosis not present

## 2015-07-22 DIAGNOSIS — J449 Chronic obstructive pulmonary disease, unspecified: Secondary | ICD-10-CM | POA: Diagnosis not present

## 2015-07-22 DIAGNOSIS — M1990 Unspecified osteoarthritis, unspecified site: Secondary | ICD-10-CM | POA: Diagnosis not present

## 2015-07-22 DIAGNOSIS — M961 Postlaminectomy syndrome, not elsewhere classified: Secondary | ICD-10-CM | POA: Diagnosis not present

## 2015-07-22 DIAGNOSIS — Z87891 Personal history of nicotine dependence: Secondary | ICD-10-CM | POA: Diagnosis not present

## 2015-07-22 DIAGNOSIS — K219 Gastro-esophageal reflux disease without esophagitis: Secondary | ICD-10-CM | POA: Diagnosis not present

## 2015-07-22 DIAGNOSIS — I10 Essential (primary) hypertension: Secondary | ICD-10-CM | POA: Insufficient documentation

## 2015-07-22 DIAGNOSIS — G629 Polyneuropathy, unspecified: Secondary | ICD-10-CM | POA: Diagnosis not present

## 2015-07-22 DIAGNOSIS — E119 Type 2 diabetes mellitus without complications: Secondary | ICD-10-CM | POA: Insufficient documentation

## 2015-07-22 DIAGNOSIS — G894 Chronic pain syndrome: Secondary | ICD-10-CM | POA: Diagnosis not present

## 2015-07-22 HISTORY — PX: SPINAL CORD STIMULATOR BATTERY EXCHANGE: SHX6202

## 2015-07-22 LAB — CBC
HCT: 33.3 % — ABNORMAL LOW (ref 36.0–46.0)
Hemoglobin: 11 g/dL — ABNORMAL LOW (ref 12.0–15.0)
MCH: 25.9 pg — ABNORMAL LOW (ref 26.0–34.0)
MCHC: 33 g/dL (ref 30.0–36.0)
MCV: 78.4 fL (ref 78.0–100.0)
Platelets: 302 10*3/uL (ref 150–400)
RBC: 4.25 MIL/uL (ref 3.87–5.11)
RDW: 13.8 % (ref 11.5–15.5)
WBC: 4.8 10*3/uL (ref 4.0–10.5)

## 2015-07-22 LAB — GLUCOSE, CAPILLARY
Glucose-Capillary: 88 mg/dL (ref 65–99)
Glucose-Capillary: 86 mg/dL (ref 65–99)

## 2015-07-22 LAB — BASIC METABOLIC PANEL
Anion gap: 7 (ref 5–15)
BUN: 15 mg/dL (ref 6–20)
CO2: 30 mmol/L (ref 22–32)
Calcium: 9.2 mg/dL (ref 8.9–10.3)
Chloride: 103 mmol/L (ref 101–111)
Creatinine, Ser: 0.74 mg/dL (ref 0.44–1.00)
GFR calc Af Amer: >60 mL/min (ref >60)
GFR calc non Af Amer: >60 mL/min (ref >60)
Glucose, Bld: 91 mg/dL (ref 65–99)
Potassium: 3.7 mmol/L (ref 3.5–5.1)
Sodium: 140 mmol/L (ref 135–145)

## 2015-07-22 LAB — SURGICAL PCR SCREEN
MRSA, PCR: NEGATIVE
Staphylococcus aureus: NEGATIVE

## 2015-07-22 SURGERY — SPINAL CORD STIMULATOR BATTERY EXCHANGE
Anesthesia: General | Site: Back

## 2015-07-22 MED ORDER — OXYCODONE HCL 5 MG PO TABS: 5.0000 mg | ORAL_TABLET | Freq: Once | ORAL | Status: DC | PRN

## 2015-07-22 MED ORDER — ROCURONIUM BROMIDE 100 MG/10ML IV SOLN
INTRAVENOUS | Status: DC | PRN
  Administered 2015-07-22: 35 mg via INTRAVENOUS

## 2015-07-22 MED ORDER — 0.9 % SODIUM CHLORIDE (POUR BTL) OPTIME
TOPICAL | Status: DC | PRN
  Administered 2015-07-22: 1000 mL

## 2015-07-22 MED ORDER — HYDROMORPHONE HCL 1 MG/ML IJ SOLN
INTRAMUSCULAR | Status: AC
  Administered 2015-07-22: 0.5 mg via INTRAVENOUS
  Filled 2015-07-22: qty 1

## 2015-07-22 MED ORDER — MIDAZOLAM HCL 5 MG/5ML IJ SOLN
INTRAMUSCULAR | Status: DC | PRN
  Administered 2015-07-22: 2 mg via INTRAVENOUS

## 2015-07-22 MED ORDER — MUPIROCIN 2 % EX OINT
TOPICAL_OINTMENT | CUTANEOUS | Status: AC
  Administered 2015-07-22: 1 via TOPICAL
  Filled 2015-07-22: qty 22

## 2015-07-22 MED ORDER — ONDANSETRON HCL 4 MG/2ML IJ SOLN: 4.0000 mg | Freq: Once | INTRAMUSCULAR | Status: DC | PRN

## 2015-07-22 MED ORDER — FENTANYL CITRATE (PF) 100 MCG/2ML IJ SOLN
INTRAMUSCULAR | Status: DC | PRN
  Administered 2015-07-22 (×2): 50 ug via INTRAVENOUS

## 2015-07-22 MED ORDER — METHOCARBAMOL 1000 MG/10ML IJ SOLN: 500.0000 mg | Freq: Four times a day (QID) | INTRAVENOUS | Status: DC | PRN

## 2015-07-22 MED ORDER — OXYCODONE-ACETAMINOPHEN 5-325 MG PO TABS
1.0000 | ORAL_TABLET | ORAL | Status: DC | PRN
  Administered 2015-07-22: 2 via ORAL

## 2015-07-22 MED ORDER — OXYCODONE-ACETAMINOPHEN 5-325 MG PO TABS
ORAL_TABLET | ORAL | Status: AC
  Filled 2015-07-22: qty 2

## 2015-07-22 MED ORDER — BUPIVACAINE-EPINEPHRINE (PF) 0.25% -1:200000 IJ SOLN
INTRAMUSCULAR | Status: AC
  Filled 2015-07-22: qty 30

## 2015-07-22 MED ORDER — ACETAMINOPHEN 10 MG/ML IV SOLN
1000.0000 mg | INTRAVENOUS | Status: AC
  Administered 2015-07-22: 1000 mg via INTRAVENOUS
  Filled 2015-07-22: qty 100

## 2015-07-22 MED ORDER — PROPOFOL 10 MG/ML IV BOLUS
INTRAVENOUS | Status: AC
  Filled 2015-07-22: qty 20

## 2015-07-22 MED ORDER — BUPIVACAINE-EPINEPHRINE 0.25% -1:200000 IJ SOLN
INTRAMUSCULAR | Status: DC | PRN
  Administered 2015-07-22 (×2): 10 mL

## 2015-07-22 MED ORDER — FENTANYL CITRATE (PF) 250 MCG/5ML IJ SOLN
INTRAMUSCULAR | Status: AC
  Filled 2015-07-22: qty 5

## 2015-07-22 MED ORDER — ROCURONIUM BROMIDE 50 MG/5ML IV SOLN
INTRAVENOUS | Status: AC
  Filled 2015-07-22: qty 1

## 2015-07-22 MED ORDER — CEFAZOLIN SODIUM-DEXTROSE 2-3 GM-% IV SOLR
INTRAVENOUS | Status: DC | PRN
  Administered 2015-07-22: 2 g via INTRAVENOUS

## 2015-07-22 MED ORDER — PHENOL 1.4 % MT LIQD: 1.0000 | OROMUCOSAL | Status: DC | PRN

## 2015-07-22 MED ORDER — LACTATED RINGERS IV SOLN
INTRAVENOUS | Status: DC | PRN
  Administered 2015-07-22: 08:00:00 via INTRAVENOUS

## 2015-07-22 MED ORDER — MUPIROCIN 2 % EX OINT
1.0000 "application " | TOPICAL_OINTMENT | Freq: Once | CUTANEOUS | Status: AC
  Administered 2015-07-22: 1 via TOPICAL

## 2015-07-22 MED ORDER — LIDOCAINE HCL (CARDIAC) 20 MG/ML IV SOLN
INTRAVENOUS | Status: DC | PRN
Start: 2015-07-22 — End: 2015-07-22
  Administered 2015-07-22: 60 mg via INTRAVENOUS

## 2015-07-22 MED ORDER — PHENYLEPHRINE 40 MCG/ML (10ML) SYRINGE FOR IV PUSH (FOR BLOOD PRESSURE SUPPORT)
PREFILLED_SYRINGE | INTRAVENOUS | Status: AC
  Filled 2015-07-22: qty 10

## 2015-07-22 MED ORDER — SUGAMMADEX SODIUM 200 MG/2ML IV SOLN
INTRAVENOUS | Status: DC | PRN
  Administered 2015-07-22: 200 mg via INTRAVENOUS

## 2015-07-22 MED ORDER — SUCCINYLCHOLINE CHLORIDE 20 MG/ML IJ SOLN
INTRAMUSCULAR | Status: AC
  Filled 2015-07-22: qty 1

## 2015-07-22 MED ORDER — METHOCARBAMOL 500 MG PO TABS
500.0000 mg | ORAL_TABLET | Freq: Four times a day (QID) | ORAL | Status: DC | PRN
  Administered 2015-07-22: 500 mg via ORAL

## 2015-07-22 MED ORDER — SUGAMMADEX SODIUM 200 MG/2ML IV SOLN
INTRAVENOUS | Status: AC
  Filled 2015-07-22: qty 2

## 2015-07-22 MED ORDER — ONDANSETRON HCL 4 MG/2ML IJ SOLN
INTRAMUSCULAR | Status: AC
  Filled 2015-07-22: qty 2

## 2015-07-22 MED ORDER — ONDANSETRON HCL 4 MG/2ML IJ SOLN
INTRAMUSCULAR | Status: DC | PRN
  Administered 2015-07-22: 4 mg via INTRAVENOUS

## 2015-07-22 MED ORDER — MIDAZOLAM HCL 2 MG/2ML IJ SOLN
INTRAMUSCULAR | Status: AC
  Filled 2015-07-22: qty 2

## 2015-07-22 MED ORDER — HYDROMORPHONE HCL 1 MG/ML IJ SOLN
0.2500 mg | INTRAMUSCULAR | Status: DC | PRN
  Administered 2015-07-22 (×4): 0.5 mg via INTRAVENOUS

## 2015-07-22 MED ORDER — MENTHOL 3 MG MT LOZG
1.0000 | LOZENGE | OROMUCOSAL | Status: DC | PRN
  Administered 2015-07-22: 1 via ORAL

## 2015-07-22 MED ORDER — MENTHOL 3 MG MT LOZG
LOZENGE | OROMUCOSAL | Status: AC
  Administered 2015-07-22: 1 via ORAL
  Filled 2015-07-22: qty 9

## 2015-07-22 MED ORDER — LIDOCAINE HCL (CARDIAC) 20 MG/ML IV SOLN
INTRAVENOUS | Status: AC
  Filled 2015-07-22: qty 5

## 2015-07-22 MED ORDER — METHOCARBAMOL 500 MG PO TABS
ORAL_TABLET | ORAL | Status: AC
  Filled 2015-07-22: qty 1

## 2015-07-22 MED ORDER — OXYCODONE HCL 5 MG/5ML PO SOLN: 5.0000 mg | Freq: Once | ORAL | Status: DC | PRN

## 2015-07-22 MED ORDER — PROPOFOL 10 MG/ML IV BOLUS
INTRAVENOUS | Status: DC | PRN
  Administered 2015-07-22: 150 mg via INTRAVENOUS

## 2015-07-22 SURGICAL SUPPLY — 58 items
CANISTER SUCTION 2500CC (MISCELLANEOUS) ×3 IMPLANT
CLOSURE STERI-STRIP 1/2X4 (GAUZE/BANDAGES/DRESSINGS) ×1
CLOSURE WOUND 1/2 X4 (GAUZE/BANDAGES/DRESSINGS)
CLSR STERI-STRIP ANTIMIC 1/2X4 (GAUZE/BANDAGES/DRESSINGS) ×1 IMPLANT
CORDS BIPOLAR (ELECTRODE) ×1 IMPLANT
DRAPE C-ARM 42X72 X-RAY (DRAPES) ×3 IMPLANT
DRAPE C-ARMOR (DRAPES) ×1 IMPLANT
DRAPE INCISE IOBAN 66X45 STRL (DRAPES) ×3 IMPLANT
DRAPE PED LAPAROTOMY (DRAPES) ×3 IMPLANT
DRAPE SURG 17X23 STRL (DRAPES) ×3 IMPLANT
DRAPE U-SHAPE 47X51 STRL (DRAPES) ×3 IMPLANT
DRSG MEPILEX BORDER 4X4 (GAUZE/BANDAGES/DRESSINGS) ×3 IMPLANT
DURAPREP 26ML APPLICATOR (WOUND CARE) ×3 IMPLANT
ELECT BLADE 4.0 EZ CLEAN MEGAD (MISCELLANEOUS)
ELECT PENCIL ROCKER SW 15FT (MISCELLANEOUS) ×3 IMPLANT
ELECT REM PT RETURN 9FT ADLT (ELECTROSURGICAL) ×3
ELECTRODE BLDE 4.0 EZ CLN MEGD (MISCELLANEOUS) ×1 IMPLANT
ELECTRODE REM PT RTRN 9FT ADLT (ELECTROSURGICAL) ×1 IMPLANT
GENERATOR PULSE PROCLAIM 5ELIT (Neuro Prosthesis/Implant) IMPLANT
GLOVE BIOGEL PI IND STRL 8 (GLOVE) ×1 IMPLANT
GLOVE BIOGEL PI IND STRL 8.5 (GLOVE) ×1 IMPLANT
GLOVE BIOGEL PI INDICATOR 8 (GLOVE) ×2
GLOVE BIOGEL PI INDICATOR 8.5 (GLOVE) ×2
GLOVE ORTHO TXT STRL SZ7.5 (GLOVE) ×3 IMPLANT
GLOVE SS BIOGEL STRL SZ 8.5 (GLOVE) ×2 IMPLANT
GLOVE SUPERSENSE BIOGEL SZ 8.5 (GLOVE) ×4
GOWN STRL REUS W/ TWL LRG LVL3 (GOWN DISPOSABLE) ×1 IMPLANT
GOWN STRL REUS W/TWL 2XL LVL3 (GOWN DISPOSABLE) ×6 IMPLANT
GOWN STRL REUS W/TWL LRG LVL3 (GOWN DISPOSABLE) ×3
KIT BASIN OR (CUSTOM PROCEDURE TRAY) ×3 IMPLANT
KIT ROOM TURNOVER OR (KITS) ×3 IMPLANT
NDL SPNL 18GX3.5 QUINCKE PK (NEEDLE) ×2 IMPLANT
NDL SUT 6 .5 CRC .975X.05 MAYO (NEEDLE) ×1 IMPLANT
NEEDLE 22X1 1/2 (OR ONLY) (NEEDLE) ×2 IMPLANT
NEEDLE MAYO TAPER (NEEDLE)
NEEDLE SPNL 18GX3.5 QUINCKE PK (NEEDLE) IMPLANT
NS IRRIG 1000ML POUR BTL (IV SOLUTION) ×3 IMPLANT
PACK GENERAL/GYN (CUSTOM PROCEDURE TRAY) ×3 IMPLANT
PACK UNIVERSAL I (CUSTOM PROCEDURE TRAY) ×3 IMPLANT
PAD ARMBOARD 7.5X6 YLW CONV (MISCELLANEOUS) ×6 IMPLANT
PATTIES SURGICAL .5 X.5 (GAUZE/BANDAGES/DRESSINGS) ×1 IMPLANT
PULSE GENERATOR PROCLAIM 5ELIT (Neuro Prosthesis/Implant) ×3 IMPLANT
SPONGE LAP 4X18 X RAY DECT (DISPOSABLE) ×3 IMPLANT
STAPLER VISISTAT 35W (STAPLE) IMPLANT
STRIP CLOSURE SKIN 1/2X4 (GAUZE/BANDAGES/DRESSINGS) ×1 IMPLANT
SURGIFLO W/THROMBIN 8M KIT (HEMOSTASIS) IMPLANT
SUT BONE WAX W31G (SUTURE) ×3 IMPLANT
SUT FIBERWIRE #2 38 T-5 BLUE (SUTURE)
SUT MON AB 3-0 SH 27 (SUTURE) ×3
SUT MON AB 3-0 SH27 (SUTURE) ×1 IMPLANT
SUT VIC AB 1 CT1 27 (SUTURE) ×3
SUT VIC AB 1 CT1 27XBRD ANBCTR (SUTURE) ×2 IMPLANT
SUT VIC AB 2-0 CT1 18 (SUTURE) ×2 IMPLANT
SUTURE FIBERWR #2 38 T-5 BLUE (SUTURE) ×1 IMPLANT
SYR CONTROL 10ML LL (SYRINGE) IMPLANT
TOWEL OR 17X24 6PK STRL BLUE (TOWEL DISPOSABLE) ×6 IMPLANT
TOWEL OR 17X26 10 PK STRL BLUE (TOWEL DISPOSABLE) ×3 IMPLANT
WATER STERILE IRR 1000ML POUR (IV SOLUTION) ×1 IMPLANT

## 2015-07-22 NOTE — Anesthesia Preprocedure Evaluation (Addendum)
Anesthesia Evaluation  Patient identified by MRN, date of birth, ID band Patient awake    Reviewed: Allergy & Precautions, NPO status , Patient's Chart, lab work & pertinent test results  Airway Mallampati: I  TM Distance: >3 FB     Dental  (+) Teeth Intact, Dental Advisory Given, Edentulous Upper   Pulmonary former smoker,    breath sounds clear to auscultation       Cardiovascular hypertension, Pt. on medications + angina  Rhythm:Regular     Neuro/Psych    GI/Hepatic GERD  ,  Endo/Other  diabetes, Well Controlled, Type 1, Insulin Dependent  Renal/GU      Musculoskeletal  (+) Arthritis ,   Abdominal (+)  Abdomen: soft. Bowel sounds: normal.  Peds  Hematology   Anesthesia Other Findings   Reproductive/Obstetrics                           Anesthesia Physical Anesthesia Plan  ASA: III  Anesthesia Plan: General   Post-op Pain Management:    Induction: Intravenous  Airway Management Planned: Oral ETT  Additional Equipment:   Intra-op Plan:   Post-operative Plan: Extubation in OR  Informed Consent: I have reviewed the patients History and Physical, chart, labs and discussed the procedure including the risks, benefits and alternatives for the proposed anesthesia with the patient or authorized representative who has indicated his/her understanding and acceptance.     Plan Discussed with: CRNA and Anesthesiologist  Anesthesia Plan Comments:         Anesthesia Quick Evaluation

## 2015-07-22 NOTE — Transfer of Care (Signed)
Immediate Anesthesia Transfer of Care Note  Patient: Lori Potts  Procedure(s) Performed: Procedure(s): REIMPLANTATION OF SPINAL CORD STIMULATOR BATTERY  (N/A)  Patient Location: PACU  Anesthesia Type:General  Level of Consciousness: awake, alert  and oriented  Airway & Oxygen Therapy: Patient connected to face mask oxygen  Post-op Assessment: Report given to RN  Post vital signs: stable  Last Vitals:  Filed Vitals:   07/22/15 0622  BP: 148/50  Pulse: 56  Temp: 36.3 C  Resp: 99    Complications: No apparent anesthesia complications

## 2015-07-22 NOTE — Progress Notes (Signed)
Reminded to call stimulator rep. when schedule is set-up for follow-up with Dr. Shon BatonBrooks.

## 2015-07-22 NOTE — Brief Op Note (Signed)
07/22/2015  9:16 AM  PATIENT:  Lori Potts  69 y.o. female  PRE-OPERATIVE DIAGNOSIS:  CHRONIC PAIN  POST-OPERATIVE DIAGNOSIS:  CHRONIC PAIN  PROCEDURE:  Procedure(s): REIMPLANTATION OF SPINAL CORD STIMULATOR BATTERY  (N/A)  SURGEON:  Surgeon(s) and Role:    * Venita Lickahari Jeramia Saleeby, MD - Primary  PHYSICIAN ASSISTANT:   ASSISTANTS: none   ANESTHESIA:   general  EBL:  Total I/O In: 600 [I.V.:600] Out: -   BLOOD ADMINISTERED:none  DRAINS: none   LOCAL MEDICATIONS USED:  MARCAINE     SPECIMEN:  No Specimen  DISPOSITION OF SPECIMEN:  N/A  COUNTS:  YES  TOURNIQUET:  * No tourniquets in log *  DICTATION: .Other Dictation: Dictation Number [id number]  PLAN OF CARE: Discharge to home after PACU  PATIENT DISPOSITION:  PACU - hemodynamically stable.

## 2015-07-22 NOTE — Discharge Instructions (Signed)
Keep wound clean and dry Ice for swelling Restart pain medications

## 2015-07-22 NOTE — H&P (Signed)
History of Present Illness  The patient is a 69 year old female who comes in today for a preoperative History and Physical. The patient is scheduled for a reimplantation of SCS battery to be performed by Dr. Debria Garretahari D. Shon BatonBrooks, [facility] 07-22-15 . Please see the hospital record for complete dictated history and physical. the pt has DM. last A1c was 7. She reports increasing LBP and the RLE.   Allergies  No Known Drug Allergies 03/30/2011  Family History Lamar Blinks(Robin C Young; 07/14/2015 8:58 AM) Kidney disease  father Hypertension  Father, Mother. child Liver Disease, Chronic  First Degree Relatives. father Rheumatoid Arthritis  child Osteoarthritis  child Heart disease in female family member before age 69  Depression  First Degree Relatives. mother and child Congestive Heart Failure  Mother. mother Diabetes Mellitus  Mother. mother Heart Disease  Mother. Drug / Alcohol Addiction  Father.  Social History  Tobacco / smoke exposure  06/04/2014: no no Tobacco use  Former smoker. 06/04/2014: smoke(d) less than 1/2 pack(s) per day former smoker; smoke(d) less than 1/2 pack(s) per day Non smoker / no tobacco use  Number of flights of stairs before winded  4-5 greater than 5 No history of drug/alcohol rehab  Non drinker / no alcohol Use  Pain Contract  no Under pain contract  Previously in rehab  no Current work status  unemployed Drug/Alcohol Rehab (Currently)  no Alcohol use  never consumed alcohol Children  3 Exercise  Exercises daily; does running / walking Exercises daily; does gym / weights Living situation  live with spouse Marital status  married Former drinker  06/04/2014: In the past drank Illicit drug use  no  Medication History  Percocet (10-325MG  Tablet, 1 (one) Oral five times daily as needed for pain, Taken starting 07/02/2015) Active. TraZODone HCl (50MG  Tablet, 1 (one) Tablet Oral QHS, Taken starting 07/13/2015)  Active. Vitamin D (Ergocalciferol) (50000UNIT Capsule, Oral) Active. Lisinopril-Hydrochlorothiazide (20-25MG  Tablet, Oral) Active. Atorvastatin Calcium (40MG  Tablet, Oral) Active. Albuterol Sulfate ((2.5 MG/3ML)0.083% Nebulized Soln, Inhalation) Active. Medications Reconciled  Past Surgical History  Foot Surgery  bilateral Total Knee Replacement  left Arthroscopic Knee Surgery - Right  Breast Reconstruction  bilateral Arthroscopy of Knee  bilateral left Hysterectomy  partial (non-cancerous) Spinal Surgery  Total Knee Replacement - Left  Breast Mass; Local Excision  right Colectomy  partial Back Surgery  SCS Tubal Ligation  Other Surgery  SMALL INTESTINE FOR BOWEL BLOCKAGE Spinal Fusion  lower back Appendectomy   Other Problems  Chronic Obstructive Lung Disease  Diabetes Mellitus, Type II  Gastroesophageal Reflux Disease  High blood pressure  Hypercholesterolemia  Migraine Headache   Vitals  07/14/2015 8:58 AM Weight: 183 lb Height: 63in Weight was reported by patient. Height was reported by patient. Body Surface Area: 1.86 m Body Mass Index: 32.42 kg/m  Temp.: 47F(Oral)  Pulse: 73 (Regular)  BP: 151/83 (Sitting, Left Arm, Standard)  Physical Exam  General General Appearance-Not in acute distress. Orientation-Oriented X3. Build & Nutrition-Well nourished and Well developed.  Integumentary General Characteristics Surgical Scars - surgical scarring consistent with previous lumbar surgery and anterior neck surgical scarring consistent with previous cervical surgery. Lumbar Spine-Skin examination of the lumbar spine is without deformity, skin lesions, lacerations or abrasions.  Chest and Lung Exam Auscultation Breath sounds - Normal and Clear.  Cardiovascular Auscultation Rhythm - Regular rate and rhythm.  Abdomen Palpation/Percussion Palpation and Percussion of the abdomen reveal - Soft, Non Tender and No  Rebound tenderness.  Peripheral  Vascular Lower Extremity Palpation - Posterior tibial pulse - Bilateral - 2+. Dorsalis pedis pulse - Bilateral - 2+.  Neurologic Sensation Lower Extremity - Bilateral - sensation is intact in the lower extremity. Reflexes Patellar Reflex - Bilateral - 2+. Achilles Reflex - Bilateral - 2+. Clonus - Bilateral - clonus not present. Hoffman's Sign - Bilateral - Hoffman's sign not present. Testing Seated Straight Leg Raise - Right - Seated straight leg raise positive.  Musculoskeletal Spine/Ribs/Pelvis  Lumbosacral Spine: Inspection and Palpation - Tenderness - right lumbar paraspinals tender to palpation. Strength and Tone: Strength - Hip Flexion - Left - 5/5. Right - 4-/5. Knee Extension - Bilateral - 5/5. Knee Flexion - Bilateral - 5/5. Ankle Dorsiflexion - Bilateral - 5/5. Ankle Plantarflexion - Bilateral - 5/5. Heel walk - Bilateral - unable to heel walk. Toe Walk - Bilateral - unable to walk on toes. Heel-Toe Walk - Bilateral - able to heel-toe walk with mild difficulty. ROM - Flexion - moderately decreased range of motion and painful. Extension - moderately decreased range of motion and painful. Left Lateral Bending - moderately decreased range of motion and painful. Right Lateral Bending - moderately decreased range of motion and painful. Right Rotation - moderately decreased range of motion and painful. Left Rotation - moderately decreased range of motion and painful. Pain - neither flexion or extension is more painful than the other. Lumbosacral Spine - Waddell's Signs - no Waddell's signs present. Lower Extremity Range of Motion - No true hip, knee or ankle pain with range of motion. Gait and Station - [employer] - no assistive devices.  Plan: Patient with ongoing LE neuropathic pain.  After discussing treatment options she has elected to have the SCS battery replaced.  Will enable new features specifically burst which can provide improved pain  controll.  All risks reviewed - including death, stroke, damage to leads, need for futher surgery, ongoing or worse pain, bleeding.

## 2015-07-22 NOTE — Anesthesia Procedure Notes (Signed)
Procedure Name: Intubation Date/Time: 07/22/2015 8:21 AM Performed by: Dorie RankQUINN, Lorean Ekstrand M Pre-anesthesia Checklist: Patient identified, Emergency Drugs available, Suction available and Patient being monitored Patient Re-evaluated:Patient Re-evaluated prior to inductionOxygen Delivery Method: Circle system utilized Preoxygenation: Pre-oxygenation with 100% oxygen Intubation Type: IV induction Ventilation: Mask ventilation without difficulty Laryngoscope Size: Mac and 3 Grade View: Grade I Tube type: Oral Tube size: 7.0 mm Number of attempts: 1 Airway Equipment and Method: Stylet Placement Confirmation: ETT inserted through vocal cords under direct vision,  positive ETCO2 and breath sounds checked- equal and bilateral Secured at: 21 cm Tube secured with: Tape Dental Injury: Teeth and Oropharynx as per pre-operative assessment

## 2015-07-22 NOTE — Anesthesia Postprocedure Evaluation (Signed)
Anesthesia Post Note  Patient: Lori Potts  Procedure(s) Performed: Procedure(s) (LRB): REIMPLANTATION OF SPINAL CORD STIMULATOR BATTERY  (N/A)  Patient location during evaluation: PACU Anesthesia Type: General Level of consciousness: awake and awake and alert Pain management: pain level controlled Vital Signs Assessment: post-procedure vital signs reviewed and stable Respiratory status: spontaneous breathing and nonlabored ventilation Cardiovascular status: blood pressure returned to baseline Anesthetic complications: no    Last Vitals:  Filed Vitals:   07/22/15 1030 07/22/15 1054  BP: 132/68 144/60  Pulse: 46 55  Temp: 36.7 C 36.2 C  Resp: 17     Last Pain:  Filed Vitals:   07/22/15 1104  PainSc: 5     LLE Motor Response: Responds to commands, Purposeful movement LLE Sensation: Full sensation, No numbness, No pain, No tingling RLE Motor Response: Responds to commands, Purposeful movement RLE Sensation: Full sensation, No numbness, No pain, No tingling      Nicloe Frontera COKER

## 2015-07-23 ENCOUNTER — Ambulatory Visit: Payer: Commercial Managed Care - HMO | Admitting: Obstetrics and Gynecology

## 2015-07-23 ENCOUNTER — Encounter (HOSPITAL_COMMUNITY): Payer: Self-pay | Admitting: Orthopedic Surgery

## 2015-07-23 NOTE — Op Note (Signed)
NAMShe[facility] Slider:  Vandekamp, Laiyah              ACCOUNT NO.:  [id number]  MEDICAL RECORD NO.:  [id number]  LOCATION:  [facility]                         FACILITY:  [facility]  PHYSICIAN:  Nikolaus Pienta D. Shon BatonBrooks, M.D. DATE OF BIRTH:  December 13, 1945  DATE OF PROCEDURE:  07/22/2015 DATE OF DISCHARGE:  07/22/2015                              OPERATIVE REPORT   PREOPERATIVE DIAGNOSIS:  Chronic neuropathic lower extremity pain.  POSTOPERATIVE DIAGNOSIS:  Chronic neuropathic lower extremity pain.  OPERATIVE PROCEDURE:  Reimplantation of new spinal cord stimulator battery.  COMPLICATIONS:  None.  CONDITION:  Stable.  HISTORY:  This is a very pleasant 69 year old woman who had previous spinal cord stimulator battery removed.  It was no longer functioning well for her.  Because of her ongoing neuropathic pain, we discussed placing a new battery that had new technologies that could control her pain.  After discussing risks, benefits and alternatives, she elected to proceed with the surgery.  OPERATIVE NOTE:  The patient was brought to the operating room and placed supine on the operating table.  After successful induction of general anesthesia and endotracheal intubation, TEDs and SCDs were applied.  She was turned prone onto the Wilson frame and all bony prominences were well padded.  The back was prepped and draped in standard fashion.  Time-out was taken confirming the patient, procedure and all other pertinent important data.  Once this was completed, the previous right-sided gluteal incision was re-infiltrated with 0.25% Marcaine with epi, total of 10 mL.  I re-incised this and sharply dissected down through the deep fascia.  I then sharply dissected using scissors into the battery packet.  I then manually palpated until I could eventually identify the location of the leads.  Once they were identified, I mobilized them and brought them out of the incision site. I then cleaned them and then connected them into the  battery and locked them in place with the small torque wrench.  We then tested the battery and [facility], the leads were functioning fine. The leads were then coiled up on the undersurface of the battery and was placed into the pocket that I created.  I secured it to the deep fascia using two #1 Vicryl sutures.  I then closed the deep fascia with another interrupted #1 Vicryl suture, superficial with 2-0 Vicryl sutures and then a 3-0 Monocryl for the skin.  Steri-Strips and a dry dressings were ultimately applied.  I did inject another 10 mL of 0.25% Marcaine with epi for improved postoperative analgesia.  At the end of the case, all needle and sponge counts were correct.  The patient was extubated, transferred to the PACU without incident.  The patient will be discharged from the PACU to home.  She has Percocet at home and her medications.  She will ice and keep the wound clean and dry.  I will see her again in my office in 2 weeks for wound re-evaluation.  She knows to contact me if there is any signs of infection such as fever, drainage or if there is increasing pain.  Prior to discharge, [facility] battery, can make sure she  is instructed on how to use it.     Shay Jhaveri D. Shon Baton, M.D.     DDB/MEDQ  D:  07/22/2015  T:  07/23/2015  Job:  [id number]

## 2015-08-04 ENCOUNTER — Encounter: Payer: Self-pay | Admitting: Family Medicine

## 2015-08-04 ENCOUNTER — Ambulatory Visit (INDEPENDENT_AMBULATORY_CARE_PROVIDER_SITE_OTHER): Payer: Commercial Managed Care - HMO | Admitting: Family Medicine

## 2015-08-04 ENCOUNTER — Other Ambulatory Visit: Payer: Self-pay

## 2015-08-04 VITALS — BP 118/68 | HR 68 | Temp 98.5°F | Resp 18 | Ht 63.0 in | Wt 184.0 lb

## 2015-08-04 DIAGNOSIS — M1711 Unilateral primary osteoarthritis, right knee: Secondary | ICD-10-CM

## 2015-08-04 DIAGNOSIS — G8929 Other chronic pain: Secondary | ICD-10-CM

## 2015-08-04 DIAGNOSIS — Z820 Family history of epilepsy and other diseases of the nervous system: Secondary | ICD-10-CM

## 2015-08-04 DIAGNOSIS — I10 Essential (primary) hypertension: Secondary | ICD-10-CM | POA: Diagnosis not present

## 2015-08-04 DIAGNOSIS — E785 Hyperlipidemia, unspecified: Secondary | ICD-10-CM | POA: Diagnosis not present

## 2015-08-04 DIAGNOSIS — E1169 Type 2 diabetes mellitus with other specified complication: Secondary | ICD-10-CM | POA: Diagnosis not present

## 2015-08-04 DIAGNOSIS — Z8669 Personal history of other diseases of the nervous system and sense organs: Secondary | ICD-10-CM | POA: Diagnosis not present

## 2015-08-04 DIAGNOSIS — Z9889 Other specified postprocedural states: Secondary | ICD-10-CM

## 2015-08-04 DIAGNOSIS — E7800 Pure hypercholesterolemia, unspecified: Secondary | ICD-10-CM

## 2015-08-04 DIAGNOSIS — J3089 Other allergic rhinitis: Secondary | ICD-10-CM

## 2015-08-04 LAB — POCT GLYCOSYLATED HEMOGLOBIN (HGB A1C): Hemoglobin A1C: 7.2

## 2015-08-04 MED ORDER — FLUTICASONE PROPIONATE 50 MCG/ACT NA SUSP: 2.0000 | Freq: Every day | NASAL | Status: DC

## 2015-08-04 MED ORDER — PROMETHAZINE HCL 25 MG/ML IJ SOLN
25.0000 mg | Freq: Once | INTRAMUSCULAR | Status: AC
  Administered 2015-08-04: 25 mg via INTRAMUSCULAR

## 2015-08-04 MED ORDER — PROMETHAZINE HCL 25 MG PO TABS
25.0000 mg | ORAL_TABLET | Freq: Once | ORAL | Status: DC
Start: 2015-08-04 — End: 2015-10-06

## 2015-08-04 MED ORDER — KETOROLAC TROMETHAMINE 60 MG/2ML IM SOLN
60.0000 mg | Freq: Once | INTRAMUSCULAR | Status: AC
  Administered 2015-08-04: 60 mg via INTRAMUSCULAR

## 2015-08-04 MED ORDER — IBUPROFEN 800 MG PO TABS: 800.0000 mg | ORAL_TABLET | Freq: Three times a day (TID) | ORAL | Status: DC | PRN

## 2015-08-04 NOTE — Progress Notes (Signed)
Name: Lori Potts   MRN: [id number]    DOB: 1946-06-30   Date:08/04/2015       Progress Note  Subjective  Chief Complaint  Chief Complaint  Patient presents with  . Hypertension    pt here for 2 month follow up  . Diabetes  . Hyperlipidemia    HPI  Diabetes  Patient presents for follow-up of diabetes which is present for over 5 years. Is currently on a regimen of Levemir 15 units . Patient states she is usually compliant with their diet and exercise. There's been no hypoglycemic episodes and there no polyuria polydipsia polyphagia. His average fasting glucoses been in the low around low 100s with a high around mid to high 100s . There is no end organ disease.  Last diabetic eye exam was earlier this year.   Last visit with dietitian was several years ago. Last microalbumin was obtained 01 year ago .   Hypertension   Patient presents for follow-up of hypertension. It has been present for over over 5 years years.  Patient states that there is compliance with medical regimen which consists of lisinopril HCT 20-25 once daily . There is no end organ disease. Cardiac risk factors include hypertension hyperlipidemia and diabetes.  Exercise regimen consist of some walking .  Diet consist of salt restriction .  Hyperlipidemia  Patient has a history of hyperlipidemia for over 5 years.  Current medical regimen consist of atorvastatin 40 mg daily at bedtime .  Compliance is good .  Diet and exercise are currently followed occasionally .  Risk factors for cardiovascular disease include hyperlipidemia diabetes hypertension age .   There have been no side effects from the medication.    Chronic pain  Patient has chronic lumbosacral pain for which she has an implanted TENS unit placement. She just had the battery replaced recently but is still in significant pain wishes to have a shot for subacute relief currently.  Osteoarthritis  Patient has severe osteoarthritis of the right knee with there  being bone-on-bone by x-ray. She is being evaluated by her orthopedist and will have a total knee replacement as soon as her A1c is below 7. Therefore A1c is repeated on today  Past Medical History  Diagnosis Date  . Arthritis   . Hyperlipidemia     takes Lipitor daily  . Essential hypertension, benign     takes Lisinopril-HCTZ daily  . Unspecified essential hypertension   . Hypertension   . Vitamin D deficiency     takes Vit D weekly  . Seasonal allergies     takes Singulair daily as needed  . History of migraine     last one 2-3 months ago  . Weakness     numbness and tingling left arm  . Joint pain   . Low back pain   . History of colon polyps     benign  . Nocturia   . Type II or unspecified type diabetes mellitus without mention of complication, not stated as uncontrolled     Levemir nightly;average fasting blood sugar 125-140  . Diabetes mellitus without complication (HCC)   . Cataract     right eye but immature  . History of shingles   . GERD (gastroesophageal reflux disease)     Social History  Substance Use Topics  . Smoking status: Former Smoker    Types: Cigarettes  . Smokeless tobacco: Never Used     Comment: quit smoking 31yrs ago  . Alcohol Use: No  Current outpatient prescriptions:  .  ACCU-CHEK SMARTVIEW test strip, TEST BLOOD SUGARS 4 TIMES DAILY, Disp: 150 each, Rfl: PRN .  albuterol (PROVENTIL HFA;VENTOLIN HFA) 108 (90 BASE) MCG/ACT inhaler, Inhale 2 puffs into the lungs every 6 (six) hours as needed (for wheezing and shortness of breath from seasonal allergies)., Disp: , Rfl:  .  atorvastatin (LIPITOR) 40 MG tablet, Take 1 tablet (40 mg total) by mouth daily., Disp: 30 tablet, Rfl: 5 .  chlorpheniramine-HYDROcodone (TUSSIONEX PENNKINETIC ER) 10-8 MG/5ML SUER, Take 5 mLs by mouth every 12 (twelve) hours as needed for cough., Disp: 140 mL, Rfl: 0 .  docusate sodium (COLACE) 100 MG capsule, Take 1 capsule (100 mg total) by mouth 3 (three) times  daily as needed for constipation. (Patient not taking: Reported on 02/20/2015), Disp: 30 capsule, Rfl: 0 .  gabapentin (NEURONTIN) 300 MG capsule, TAKE 1 CAPSULE BY MOUTH AT BEDTIME FOR 1 WEEK THEN 1 TWICE A DAY FOR 1 WEEK THEN TAKE 3 TIMES A DAY (Patient not taking: Reported on 07/17/2015), Disp: 90 capsule, Rfl: 0 .  Insulin Detemir (LEVEMIR FLEXTOUCH) 100 UNIT/ML Pen, Inject 15 Units into the skin at bedtime., Disp: , Rfl:  .  Insulin Pen Needle (BD ULTRA-FINE PEN NEEDLES) 29G X 12.7MM MISC, 1 pen by Does not apply route once., Disp: 100 each, Rfl: 3 .  lisinopril-hydrochlorothiazide (PRINZIDE,ZESTORETIC) 20-25 MG per tablet, Take 1 tablet by mouth daily., Disp: , Rfl:  .  methocarbamol (ROBAXIN) 500 MG tablet, Take 1 tablet (500 mg total) by mouth 3 (three) times daily as needed for muscle spasms., Disp: 60 tablet, Rfl: 0 .  montelukast (SINGULAIR) 10 MG tablet, TAKE 1 TABLET BY MOUTH EVERY DAY (Patient taking differently: Take 10 mg by mouth once daily), Disp: 30 tablet, Rfl: 0 .  ondansetron (ZOFRAN) 4 MG tablet, Take 1 tablet (4 mg total) by mouth every 8 (eight) hours as needed for nausea or vomiting., Disp: 20 tablet, Rfl: 0 .  oxyCODONE-acetaminophen (PERCOCET) 10-325 MG per tablet, Take 1 tablet by mouth every 4 (four) hours as needed for pain., Disp: 60 tablet, Rfl: 0 .  polyethylene glycol powder (GLYCOLAX) powder, Take 17 g by mouth daily. (Patient not taking: Reported on 02/20/2015), Disp: 255 g, Rfl: 1 .  predniSONE (DELTASONE) 20 MG tablet, Take 1 tablet (20 mg total) by mouth daily with breakfast. (Patient not taking: Reported on 07/17/2015), Disp: 10 tablet, Rfl: 0 .  Vitamin D, Ergocalciferol, (DRISDOL) 50000 UNITS CAPS capsule, Take 1 capsule (50,000 Units total) by mouth every 7 (seven) days. On the 1st of each month (Patient taking differently: Take 50,000 Units by mouth every 7 (seven) days. On Monday.), Disp: 12 capsule, Rfl: 0  Current facility-administered medications:  .   promethazine (PHENERGAN) tablet 25 mg, 25 mg, Oral, Once, Dennison Mascot, MD  Allergies  Allergen Reactions  . Adhesive [Tape] Rash and Other (See Comments)    Regular tape is ok, allergy is to paper tape    Review of Systems  Constitutional: Positive for malaise/fatigue. Negative for fever, chills and weight loss.  HENT: Negative for congestion, hearing loss, sore throat and tinnitus.   Eyes: Negative.  Negative for blurred vision, double vision and redness.  Respiratory: Negative for cough, hemoptysis and shortness of breath.   Cardiovascular: Negative.  Negative for chest pain, palpitations, orthopnea, claudication and leg swelling.  Gastrointestinal: Negative for heartburn, nausea, vomiting, diarrhea, constipation and blood in stool.  Genitourinary: Negative for dysuria, urgency, frequency and hematuria.  Musculoskeletal: Positive  for back pain. Negative for myalgias, joint pain, falls and neck pain.  Skin: Negative for itching.  Neurological: Positive for headaches. Negative for dizziness, tingling, tremors, focal weakness, seizures, loss of consciousness and weakness.  Endo/Heme/Allergies: Does not bruise/bleed easily.  Psychiatric/Behavioral: Negative for depression and substance abuse. The patient is not nervous/anxious and does not have insomnia.      Objective  Filed Vitals:   08/04/15 1010  BP: 118/68  Pulse: 68  Temp: 98.5 F (36.9 C)  Resp: 18  Height: 5\' 3"  (1.6 m)  Weight: 184 lb (83.462 kg)  SpO2: 97%     Physical Exam  Constitutional: She is oriented to person, place, and time and well-developed, well-nourished, and in no distress.  HENT:  Head: Normocephalic.  Eyes: EOM are normal. Pupils are equal, round, and reactive to light.  Neck: Normal range of motion. No thyromegaly present.  Cardiovascular: Normal rate, regular rhythm and normal heart sounds.   No murmur heard. Pulmonary/Chest: Effort normal and breath sounds normal.  Abdominal: Soft. Bowel  sounds are normal.  Musculoskeletal: Normal range of motion. She exhibits tenderness (lumbar area). She exhibits no edema.  Neurological: She is alert and oriented to person, place, and time. No cranial nerve deficit. Gait normal.  Skin: Skin is warm and dry. No rash noted.  Psychiatric: Memory and affect normal.      Assessment & Plan  1. Type 2 diabetes mellitus with other specified complication (HCC) Still not at goal. Increase Levemir according to sliding scale - POCT HgB A1C  2. Hyperlipemia Labs today  - Comprehensive Metabolic Panel (CMET) - Lipid Profile - TSH  3. Chronic pain Injection of Toradol with Phenergan today - ketorolac (TORADOL) injection 60 mg; Inject 2 mLs (60 mg total) into the muscle once. - promethazine (PHENERGAN) tablet 25 mg; Take 1 tablet (25 mg total) by mouth once. - promethazine (PHENERGAN) injection 25 mg; Inject 1 mL (25 mg total) into the muscle once.  4. Essential hypertension Well-controlled  5. Status post lumbar surgery Per neurosurgeon who is following her with a electrical stimulator as well as her pain meds  6. Hypercholesterolemia Labs today  7. FHx: migraine headaches Stable  8. Other allergic rhinitis Stable 9. Migraine headaches  10. Severe osteoarthritis of the right knee

## 2015-08-05 ENCOUNTER — Other Ambulatory Visit: Payer: Self-pay

## 2015-08-05 DIAGNOSIS — J0100 Acute maxillary sinusitis, unspecified: Secondary | ICD-10-CM

## 2015-08-05 MED ORDER — ONDANSETRON HCL 4 MG PO TABS
4.0000 mg | ORAL_TABLET | Freq: Three times a day (TID) | ORAL | Status: DC | PRN
Start: 2015-08-05 — End: 2015-10-21

## 2015-08-06 ENCOUNTER — Telehealth: Payer: Self-pay | Admitting: Family Medicine

## 2015-08-06 NOTE — Telephone Encounter (Signed)
[employer] referral Auth obtained for ICD-10: M96.1 Dr. Nonda LouBrooks  Auth # [id number] Start - 08/06/15 Expires - 02/02/16

## 2015-08-06 NOTE — Telephone Encounter (Signed)
Patient has appointment this afternoon @ 315p with Dr Debbe BalesBrook for post op. They need new referral the one they have on file has expired. (F) [phone] ATTNAram Beecham: Cynthia

## 2015-08-07 ENCOUNTER — Other Ambulatory Visit: Payer: Self-pay

## 2015-08-07 MED ORDER — GLUCOSE BLOOD VI STRP: ORAL_STRIP | Status: DC

## 2015-08-19 ENCOUNTER — Ambulatory Visit: Payer: Self-pay | Admitting: Family Medicine

## 2015-08-21 ENCOUNTER — Other Ambulatory Visit: Payer: Self-pay | Admitting: Family Medicine

## 2015-08-25 ENCOUNTER — Telehealth: Payer: Self-pay | Admitting: Family Medicine

## 2015-08-25 ENCOUNTER — Other Ambulatory Visit: Payer: Self-pay | Admitting: Family Medicine

## 2015-08-25 MED ORDER — HYDROCOD POLST-CPM POLST ER 10-8 MG/5ML PO SUER: 5.0000 mL | Freq: Two times a day (BID) | ORAL | Status: DC | PRN

## 2015-08-25 NOTE — Telephone Encounter (Signed)
Requesting refill on Tussinex please send to local pharmacy

## 2015-09-01 ENCOUNTER — Encounter: Payer: Self-pay | Admitting: Family Medicine

## 2015-09-01 ENCOUNTER — Ambulatory Visit (INDEPENDENT_AMBULATORY_CARE_PROVIDER_SITE_OTHER): Payer: Commercial Managed Care - HMO | Admitting: Family Medicine

## 2015-09-01 DIAGNOSIS — R059 Cough, unspecified: Secondary | ICD-10-CM

## 2015-09-01 DIAGNOSIS — R109 Unspecified abdominal pain: Secondary | ICD-10-CM

## 2015-09-01 DIAGNOSIS — R091 Pleurisy: Secondary | ICD-10-CM

## 2015-09-01 DIAGNOSIS — R05 Cough: Secondary | ICD-10-CM | POA: Diagnosis not present

## 2015-09-01 LAB — POCT URINALYSIS DIPSTICK
Bilirubin, UA: NEGATIVE
Blood, UA: NEGATIVE
Glucose, UA: NEGATIVE
Ketones, UA: NEGATIVE
Leukocytes, UA: NEGATIVE
Nitrite, UA: NEGATIVE
Protein, UA: NEGATIVE
Spec Grav, UA: <=1.005
Urobilinogen, UA: NEGATIVE
pH, UA: 5

## 2015-09-01 MED ORDER — PREDNISONE 20 MG PO TABS: 20.0000 mg | ORAL_TABLET | Freq: Every day | ORAL | Status: DC

## 2015-09-01 MED ORDER — KETOROLAC TROMETHAMINE 60 MG/2ML IM SOLN: 60.0000 mg | Freq: Once | INTRAMUSCULAR | Status: AC

## 2015-09-01 MED ORDER — MELOXICAM 15 MG PO TABS: 15.0000 mg | ORAL_TABLET | Freq: Every day | ORAL | Status: DC

## 2015-09-01 NOTE — Patient Instructions (Signed)
Pleurisy  Pleurisy is an inflammation and swelling of the lining of the lungs (pleura). Because of this inflammation, it hurts to breathe. It can be aggravated by coughing, laughing, or deep breathing. Pleurisy is often caused by an underlying infection or disease.   HOME CARE INSTRUCTIONS   Monitor your pleurisy for any changes. The following actions may help to alleviate any discomfort you are experiencing:  · Medicine may help with pain. Only take over-the-counter or prescription medicines for pain, discomfort, or fever as directed by your health care provider.  · Only take antibiotic medicine as directed. Make sure to finish it even if you start to feel better.  SEEK MEDICAL CARE IF:   · Your pain is not controlled with medicine or is increasing.  · You have an increase in pus-like (purulent) secretions brought up with coughing.  SEEK IMMEDIATE MEDICAL CARE IF:   · You have blue or dark lips, fingernails, or toenails.  · You are coughing up blood.  · You have increased difficulty breathing.  · You have continuing pain unrelieved by medicine or pain lasting more than 1 week.  · You have pain that radiates into your neck, arms, or jaw.  · You develop increased shortness of breath or wheezing.  · You develop a fever, rash, vomiting, fainting, or other serious symptoms.  MAKE SURE YOU:  · Understand these instructions.    · Will watch your condition.    · Will get help right away if you are not doing well or get worse.        This information is not intended to replace advice given to you by your health care provider. Make sure you discuss any questions you have with your health care provider.     Document Released: 08/08/2005 Document Revised: 04/10/2013 Document Reviewed: 01/20/2013  Elsevier Interactive Patient Education ©2016 [employer]

## 2015-09-01 NOTE — Progress Notes (Signed)
Name: Lori Potts   MRN: [id number]    DOB: [birth year]   Date:09/01/2015       Progress Note  Subjective  Chief Complaint  Chief Complaint  Patient presents with  . Abdominal Pain    since last night left side    HPI    Pleurisy   Patient presents with a one-day history of some severe left pleuritic chest pain. The onset was sudden and was very severe in character to 10 out of 10. It was sharp. It is associated with any movements or deep breaths. There was no hemoptysis associated no fever or chills. There is no history of thromboembolic phenomenon or any other coagulation defect or disorder. There's been no hemoptysis. She has a history ofrecurrent episodes of bronchitis. She currently has Percocet for pain control by her pain management specialist but this has not made a significant improvement in the discomfort. It is of note that these were the patient was office visit with pleurisy on today. There is no associated nausea vomiting diarrheamelena or hematochezia. There is no dysuria frequency or urgency.  Diabetes mellitus  Blood sugars remained in good control. No polyuria polydipsia polyphagia.  Past Medical History  Diagnosis Date  . Arthritis   . Hyperlipidemia     takes Lipitor daily  . Essential hypertension, benign     takes Lisinopril-HCTZ daily  . Unspecified essential hypertension   . Hypertension   . Vitamin D deficiency     takes Vit D weekly  . Seasonal allergies     takes Singulair daily as needed  . History of migraine     last one 2-3 months ago  . Weakness     numbness and tingling left arm  . Joint pain   . Low back pain   . History of colon polyps     benign  . Nocturia   . Type II or unspecified type diabetes mellitus without mention of complication, not stated as uncontrolled     Levemir nightly;average fasting blood sugar 125-140  . Diabetes mellitus without complication (HCC)   . Cataract     right eye but immature  . History of shingles    . GERD (gastroesophageal reflux disease)     Social History  Substance Use Topics  . Smoking status: Former Smoker    Types: Cigarettes  . Smokeless tobacco: Never Used     Comment: quit smoking 84yrs ago  . Alcohol Use: No     Current outpatient prescriptions:  .  ACCU-CHEK FASTCLIX LANCETS MISC, , Disp: , Rfl:  .  albuterol (PROVENTIL HFA;VENTOLIN HFA) 108 (90 BASE) MCG/ACT inhaler, Inhale 2 puffs into the lungs every 6 (six) hours as needed (for wheezing and shortness of breath from seasonal allergies)., Disp: , Rfl:  .  Alcohol Swabs (B-D SINGLE USE SWABS REGULAR) PADS, , Disp: , Rfl:  .  atorvastatin (LIPITOR) 40 MG tablet, Take 1 tablet (40 mg total) by mouth daily., Disp: 30 tablet, Rfl: 5 .  Blood Glucose Calibration (ACCU-CHEK SMARTVIEW CONTROL) LIQD, , Disp: , Rfl:  .  chlorpheniramine-HYDROcodone (TUSSIONEX PENNKINETIC ER) 10-8 MG/5ML SUER, Take 5 mLs by mouth every 12 (twelve) hours as needed for cough., Disp: 140 mL, Rfl: 0 .  citalopram (CELEXA) 10 MG tablet, , Disp: , Rfl:  .  docusate sodium (COLACE) 100 MG capsule, Take 1 capsule (100 mg total) by mouth 3 (three) times daily as needed for constipation. (Patient not taking: Reported on 02/20/2015), Disp: 30 capsule,  Rfl: 0 .  fluticasone (FLONASE) 50 MCG/ACT nasal spray, Place 2 sprays into both nostrils daily., Disp: 16 g, Rfl: 6 .  gabapentin (NEURONTIN) 300 MG capsule, TAKE 1 CAPSULE BY MOUTH AT BEDTIME FOR 1 WEEK THEN 1 TWICE A DAY FOR 1 WEEK THEN TAKE 3 TIMES A DAY, Disp: 90 capsule, Rfl: 0 .  glucose blood (ACCU-CHEK SMARTVIEW) test strip, TEST BLOOD SUGARS 4 TIMES DAILY, Disp: 150 each, Rfl: PRN .  ibuprofen (ADVIL,MOTRIN) 800 MG tablet, Take 1 tablet (800 mg total) by mouth every 8 (eight) hours as needed., Disp: 30 tablet, Rfl: 0 .  Insulin Detemir (LEVEMIR FLEXTOUCH) 100 UNIT/ML Pen, Inject 15 Units into the skin at bedtime., Disp: , Rfl:  .  Insulin Pen Needle (BD ULTRA-FINE PEN NEEDLES) 29G X 12.7MM MISC, 1 pen  by Does not apply route once., Disp: 100 each, Rfl: 3 .  lisinopril-hydrochlorothiazide (PRINZIDE,ZESTORETIC) 20-25 MG per tablet, Take 1 tablet by mouth daily., Disp: , Rfl:  .  meloxicam (MOBIC) 15 MG tablet, , Disp: , Rfl:  .  methocarbamol (ROBAXIN) 500 MG tablet, Take 1 tablet (500 mg total) by mouth 3 (three) times daily as needed for muscle spasms., Disp: 60 tablet, Rfl: 0 .  montelukast (SINGULAIR) 10 MG tablet, TAKE 1 TABLET BY MOUTH EVERY DAY (Patient taking differently: Take 10 mg by mouth once daily), Disp: 30 tablet, Rfl: 0 .  ondansetron (ZOFRAN) 4 MG tablet, Take 1 tablet (4 mg total) by mouth every 8 (eight) hours as needed for nausea or vomiting., Disp: 20 tablet, Rfl: 0 .  oxyCODONE-acetaminophen (PERCOCET) 10-325 MG per tablet, Take 1 tablet by mouth every 4 (four) hours as needed for pain., Disp: 60 tablet, Rfl: 0 .  polyethylene glycol powder (GLYCOLAX) powder, Take 17 g by mouth daily. (Patient not taking: Reported on 02/20/2015), Disp: 255 g, Rfl: 1 .  predniSONE (DELTASONE) 20 MG tablet, Take 1 tablet (20 mg total) by mouth daily with breakfast. (Patient not taking: Reported on 07/17/2015), Disp: 10 tablet, Rfl: 0 .  Vitamin D, Ergocalciferol, (DRISDOL) 50000 UNITS CAPS capsule, Take 1 capsule (50,000 Units total) by mouth every 7 (seven) days. On the 1st of each month (Patient taking differently: Take 50,000 Units by mouth every 7 (seven) days. On Monday.), Disp: 12 capsule, Rfl: 0  Current facility-administered medications:  .  promethazine (PHENERGAN) tablet 25 mg, 25 mg, Oral, Once, Dennison MascotLemont Qamar Rosman, MD  Allergies  Allergen Reactions  . Adhesive [Tape] Rash and Other (See Comments)    Regular tape is ok, allergy is to paper tape    Review of Systems  Constitutional: Negative for fever, chills and weight loss.  HENT: Negative for congestion, hearing loss, sore throat and tinnitus.   Eyes: Negative for blurred vision, double vision and redness.  Respiratory: Negative  for cough, hemoptysis and shortness of breath.   Cardiovascular: Positive for chest pain (left axillary pain). Negative for palpitations, orthopnea, claudication and leg swelling.  Gastrointestinal: Negative for heartburn, nausea, vomiting, diarrhea, constipation and blood in stool.  Genitourinary: Negative for dysuria, urgency, frequency and hematuria.  Musculoskeletal: Negative for myalgias, back pain, joint pain, falls and neck pain.  Skin: Negative for itching.  Neurological: Negative for dizziness, tingling, tremors, focal weakness, seizures, loss of consciousness, weakness and headaches.  Endo/Heme/Allergies: Does not bruise/bleed easily.  Psychiatric/Behavioral: Negative for depression and substance abuse. The patient is not nervous/anxious and does not have insomnia.      Objective  There were no vitals filed for this  visit.   Physical Exam  Constitutional: She is oriented to person, place, and time.  Patient is in moderate distress from pertinent pain  HENT:  Head: Normocephalic.  Eyes: EOM are normal. Pupils are equal, round, and reactive to light.  Neck: Normal range of motion. No thyromegaly present.  Cardiovascular: Normal rate, regular rhythm and normal heart sounds.   No murmur heard. Pulmonary/Chest: Effort normal.  There is tenderness to palpation in the left mid axillary line and tenderness to palpation along the rib cartilages as well. A pleural rub is intermittently heard on auscultation.  Abdominal: Soft. Bowel sounds are normal.  Musculoskeletal: Normal range of motion. She exhibits no edema.  Neurological: She is alert and oriented to person, place, and time. No cranial nerve deficit. Gait normal.  Skin: Skin is warm and dry. No rash noted.  Psychiatric: Memory and affect normal.      Assessment & Plan  1. Pleurisy Severe CBC hemoglobin (standing order to standing and is also she i  the finding of- meloxicam (MOBIC) 15 MG tablet;  - POCT Urinalysis  Dipstick - DG Chest 2 View; Future - predniSONE (DELTASONE) 20 MG tablet; Take 1 tablet (20 mg total) by mouth daily with breakfast.  Dispense: 10 tablet; Refill: 0 - meloxicam (MOBIC) 15 MG tablet; Take 1 tablet (15 mg total) by mouth daily.  Dispense: 14 tablet; Refill: 0 - ketorolac (TORADOL) injection 60 mg; Inject 2 mLs (60 mg total) into the muscle once.  She is to report to the emergency room with any worsening of the pain or any fever or particularly those any hemoptysis to obtain a CT scan to rule out PE  2. Abdominal pain in female  - POCT Urinalysis Dipstick  3. Cough  secondary to above - DG Chest 2 View; Future

## 2015-09-03 ENCOUNTER — Other Ambulatory Visit: Payer: Self-pay | Admitting: Family Medicine

## 2015-09-03 MED ORDER — ESOMEPRAZOLE MAGNESIUM 40 MG PO CPDR: 40.0000 mg | DELAYED_RELEASE_CAPSULE | Freq: Every day | ORAL | Status: DC

## 2015-09-04 ENCOUNTER — Other Ambulatory Visit: Payer: Self-pay | Admitting: Family Medicine

## 2015-09-04 DIAGNOSIS — R0602 Shortness of breath: Secondary | ICD-10-CM

## 2015-09-04 DIAGNOSIS — R091 Pleurisy: Secondary | ICD-10-CM

## 2015-09-15 ENCOUNTER — Encounter: Payer: Self-pay | Admitting: Internal Medicine

## 2015-09-15 ENCOUNTER — Ambulatory Visit
Admission: RE | Admit: 2015-09-15 | Discharge: 2015-09-15 | Disposition: A | Discharge status: Home / Self Care | Payer: Commercial Managed Care - HMO | Source: Ambulatory Visit | Attending: Internal Medicine | Admitting: Internal Medicine

## 2015-09-15 ENCOUNTER — Ambulatory Visit (INDEPENDENT_AMBULATORY_CARE_PROVIDER_SITE_OTHER): Payer: Commercial Managed Care - HMO | Admitting: Internal Medicine

## 2015-09-15 VITALS — BP 120/62 | HR 92 | Ht 63.5 in | Wt 183.4 lb

## 2015-09-15 DIAGNOSIS — R091 Pleurisy: Secondary | ICD-10-CM | POA: Diagnosis not present

## 2015-09-15 DIAGNOSIS — R0781 Pleurodynia: Secondary | ICD-10-CM | POA: Insufficient documentation

## 2015-09-15 DIAGNOSIS — R079 Chest pain, unspecified: Secondary | ICD-10-CM | POA: Diagnosis not present

## 2015-09-15 DIAGNOSIS — R0789 Other chest pain: Secondary | ICD-10-CM

## 2015-09-15 DIAGNOSIS — Z87891 Personal history of nicotine dependence: Secondary | ICD-10-CM | POA: Diagnosis not present

## 2015-09-15 NOTE — Progress Notes (Signed)
[facility] Consultation      Assessment and Plan:  Pleuritic chest pain. -Pain in the left side when taking in deep breaths, I suspect this is musculoskeletal, as she appears to have pinpoint tenderness on the left flank. This may be due to muscle bruising from exertion versus local degenerative joint disease, versus entrapment of the nerve. -We will just check a chest x-ray and rib x-ray  Acute bronchitis. -She had a previous episode of acute bronchitis, which now appears to have resolved  Date: 09/15/2015  MRN# [id number] CHONG JANUARY October 02, 1945  Referring Physician: Dr. Darcel Bayley is a 70 y.o. old female seen in consultation for chief complaint of:    Chief Complaint  Patient presents with  . PULMONARY CONSULT    pt. ref. by dr. Reece Leader morrisey. pt. states she's having pain on LT. side. occ. SOB. dry cough. denies wheezing or chest pain/tightness.    HPI:  The patient is a 70 year old female with a history of osteoarthritis,, essential hypertension, diabetes mellitus hyperlipidemia. She has chronic lumbosacral pain for which she has a spinal cord stimulator. She had an episode of acute bronchitis approximately 2 months ago.  She has noticed that she has been having pain in her left flank, it has been radiating down to her hip. This has been happening for about 3 weeks. It started after she made a particular movement and she got a sharp pain pain. It has been constant since then.  She got a course of prednisone for 2 weeks which did not help.   It improves with staying still, also oxycodone. She can not sleep on that side.   She denies cough. No hemoptysis. She denies any dyspnea.   PMHX:   Past Medical History  Diagnosis Date  . Arthritis   . Hyperlipidemia     takes Lipitor daily  . Essential hypertension, benign     takes Lisinopril-HCTZ daily  . Unspecified essential hypertension   . Hypertension   . Vitamin D deficiency    takes Vit D weekly  . Seasonal allergies     takes Singulair daily as needed  . History of migraine     last one 2-3 months ago  . Weakness     numbness and tingling left arm  . Joint pain   . Low back pain   . History of colon polyps     benign  . Nocturia   . Type II or unspecified type diabetes mellitus without mention of complication, not stated as uncontrolled     Levemir nightly;average fasting blood sugar 125-140  . Diabetes mellitus without complication (HCC)   . Cataract     right eye but immature  . History of shingles   . GERD (gastroesophageal reflux disease)    Surgical Hx:  Past Surgical History  Procedure Laterality Date  . Back surgery    . Total knee arthroplasty Left   . Small bowel repair    . Kidney surgery  1998    growth removed from left kidney   . Abdominal hysterectomy    . Breast surgery  1990    Augementation  . Back surgery      Lumbar fusion x 2  . Revision of scar tissue rectus muscle    . Pain stimulator    . Spinal cord stimulator battery exchange N/A 10/17/2012    Procedure: SPINAL CORD STIMULATOR BATTERY REMOVAL;  Surgeon: Venita Lick, MD;  Location: [facility] OR;  Service: Orthopedics;  Laterality: N/A;  . Anterior lat lumbar fusion N/A 03/21/2013    Procedure: ANTERIOR LATERAL LUMBAR FUSION 1 LEVEL/ XLIF L3-L4 ;  Surgeon: Venita Lick, MD;  Location: [facility] OR;  Service: Orthopedics;  Laterality: N/A;  . Posterior cervical fusion/foraminotomy Right 03/21/2013    Procedure: POSTERIOR L2-3 RIGHT FORAMINOTOMY;  Surgeon: Venita Lick, MD;  Location: [facility] OR;  Service: Orthopedics;  Laterality: Right;  . Cardiac catheterization  5/12    ef 55%  . Cardiac catheterization  10/2010    [facility]; EF 55%  . Appendectomy    . Colonoscopy    . Esophagogastroduodenoscopy    . Anterior cervical decomp/discectomy fusion N/A 02/26/2015    Procedure: ANTERIOR CERVICAL DISCECTOMY FUSION C4-5 (1 LEVEL);  Surgeon: Venita Lick, MD;  Location: [facility];  Service:  Orthopedics;  Laterality: N/A;  . Spinal cord stimulator battery exchange N/A 07/22/2015    Procedure: REIMPLANTATION OF SPINAL CORD STIMULATOR BATTERY ;  Surgeon: Venita Lick, MD;  Location: [facility] OR;  Service: Orthopedics;  Laterality: N/A;   Family Hx:  No family history on file. Social Hx:   Social History  Substance Use Topics  . Smoking status: Former Smoker    Types: Cigarettes  . Smokeless tobacco: Never Used     Comment: quit smoking 75yrs ago  . Alcohol Use: No   Medication:   Current Outpatient Rx  Name  Route  Sig  Dispense  Refill  . ACCU-CHEK FASTCLIX LANCETS MISC               . albuterol (PROVENTIL HFA;VENTOLIN HFA) 108 (90 BASE) MCG/ACT inhaler   Inhalation   Inhale 2 puffs into the lungs every 6 (six) hours as needed (for wheezing and shortness of breath from seasonal allergies).         . Alcohol Swabs (B-D SINGLE USE SWABS REGULAR) PADS               . atorvastatin (LIPITOR) 40 MG tablet   Oral   Take 1 tablet (40 mg total) by mouth daily.   30 tablet   5   . Blood Glucose Calibration (ACCU-CHEK SMARTVIEW CONTROL) LIQD               . chlorpheniramine-HYDROcodone (TUSSIONEX PENNKINETIC ER) 10-8 MG/5ML SUER   Oral   Take 5 mLs by mouth every 12 (twelve) hours as needed for cough.   140 mL   0   . citalopram (CELEXA) 10 MG tablet               . docusate sodium (COLACE) 100 MG capsule   Oral   Take 1 capsule (100 mg total) by mouth 3 (three) times daily as needed for constipation. Patient not taking: Reported on 02/20/2015   30 capsule   0   . esomeprazole (NEXIUM) 40 MG capsule   Oral   Take 1 capsule (40 mg total) by mouth daily.   90 capsule   1   . fluticasone (FLONASE) 50 MCG/ACT nasal spray   Each Nare   Place 2 sprays into both nostrils daily.   16 g   6   . gabapentin (NEURONTIN) 300 MG capsule      TAKE 1 CAPSULE BY MOUTH AT BEDTIME FOR 1 WEEK THEN 1 TWICE A DAY FOR 1 WEEK THEN TAKE 3 TIMES A DAY   90 capsule    0   . glucose blood (ACCU-CHEK SMARTVIEW) test strip      TEST  BLOOD SUGARS 4 TIMES DAILY   150 each   PRN   . ibuprofen (ADVIL,MOTRIN) 800 MG tablet   Oral   Take 1 tablet (800 mg total) by mouth every 8 (eight) hours as needed.   30 tablet   0   . Insulin Detemir (LEVEMIR FLEXTOUCH) 100 UNIT/ML Pen   Subcutaneous   Inject 15 Units into the skin at bedtime.         . Insulin Pen Needle (BD ULTRA-FINE PEN NEEDLES) 29G X 12.7MM MISC   Does not apply   1 pen by Does not apply route once.   100 each   3   . lisinopril-hydrochlorothiazide (PRINZIDE,ZESTORETIC) 20-25 MG per tablet   Oral   Take 1 tablet by mouth daily.         . meloxicam (MOBIC) 15 MG tablet               . meloxicam (MOBIC) 15 MG tablet   Oral   Take 1 tablet (15 mg total) by mouth daily.   14 tablet   0     Take after finishing Prednsione   . methocarbamol (ROBAXIN) 500 MG tablet   Oral   Take 1 tablet (500 mg total) by mouth 3 (three) times daily as needed for muscle spasms.   60 tablet   0   . montelukast (SINGULAIR) 10 MG tablet      TAKE 1 TABLET BY MOUTH EVERY DAY Patient taking differently: Take 10 mg by mouth once daily   30 tablet   0   . ondansetron (ZOFRAN) 4 MG tablet   Oral   Take 1 tablet (4 mg total) by mouth every 8 (eight) hours as needed for nausea or vomiting.   20 tablet   0   . oxyCODONE-acetaminophen (PERCOCET) 10-325 MG per tablet   Oral   Take 1 tablet by mouth every 4 (four) hours as needed for pain.   60 tablet   0   . polyethylene glycol powder (GLYCOLAX) powder   Oral   Take 17 g by mouth daily. Patient not taking: Reported on 02/20/2015   255 g   1   . predniSONE (DELTASONE) 20 MG tablet   Oral   Take 1 tablet (20 mg total) by mouth daily with breakfast. Patient not taking: Reported on 07/17/2015   10 tablet   0   . predniSONE (DELTASONE) 20 MG tablet   Oral   Take 1 tablet (20 mg total) by mouth daily with breakfast.   10 tablet   0     . Vitamin D, Ergocalciferol, (DRISDOL) 50000 UNITS CAPS capsule   Oral   Take 1 capsule (50,000 Units total) by mouth every 7 (seven) days. On the 1st of each month Patient taking differently: Take 50,000 Units by mouth every 7 (seven) days. On Monday.   12 capsule   0       Allergies:  Adhesive  Review of Systems: Gen:  Denies  fever, sweats, chills HEENT: Denies blurred vision, double vision. Cvc:  No dizziness, chest pain. Resp:   Denies cough or sputum porduction, shortness of breath Gi: Denies swallowing difficulty, stomach pain. Gu:  Denies bladder incontinence, burning urine Ext:   No Joint pain, stiffness. Skin: No skin rash,  hives Endoc:  No polyuria, polydipsia. Psych: No depression, insomnia. Other:  All other systems were reviewed with the patient and were negative other that what is mentioned in the HPI.   Physical Examination:  VS: BP 120/62 mmHg  Pulse 92  Ht 5' 3.5" (1.613 m)  Wt 183 lb 6.4 oz (83.19 kg)  BMI 31.97 kg/m2  SpO2 94%  General Appearance: No distress  Neuro:without focal findings,  speech normal,  HEENT: PERRLA, EOM intact.   Pulmonary: normal breath sounds, No wheezing.   There is pinpoint tenderness along the medial scapular line at approximately the eighth interspace extending laterally. This area is quite tender on deep palpation. There is no radiation of this pain. No abnormalities are palpated here.  CardiovascularNormal S1,S2.  No m/r/g.   Abdomen: Benign, Soft, non-tender. Left flank scar and midline spinal scar from previous spinal and kidney surgery in the past. Renal:  No costovertebral tenderness  GU:  No performed at this time. Endoc: No evident thyromegaly, no signs of acromegaly. Skin:   warm, no rashes, no ecchymosis  Extremities: normal, no cyanosis, clubbing.  Other findings:    LABORATORY PANEL:   CBC No results for input(s): WBC, HGB, HCT, PLT in the last 168  hours. ------------------------------------------------------------------------------------------------------------------  Chemistries  No results for input(s): NA, K, CL, CO2, GLUCOSE, BUN, CREATININE, CALCIUM, MG, AST, ALT, ALKPHOS, BILITOT in the last 168 hours.  Invalid input(s): GFRCGP ------------------------------------------------------------------------------------------------------------------  Cardiac Enzymes No results for input(s): TROPONINI in the last 168 hours. ------------------------------------------------------------  RADIOLOGY:  No results found.     Thank  you for the consultation and for allowing [facility] to assist in the care of your patient. Our recommendations are noted above.  Please contact us if we can be of further service.   Wells Guiles, MD.  Board Certified in Internal Medicine, Pulmonary Medicine, Critical Care Medicine, and Sleep Medicine.  [facility]   Santiago Glad, M.D.  Stephanie Acre, M.D.  Billy Fischer, M.D

## 2015-09-15 NOTE — Patient Instructions (Addendum)
--  Chest Xray.   --Rib xray (left rib pain).

## 2015-09-16 ENCOUNTER — Telehealth: Payer: Self-pay | Admitting: *Deleted

## 2015-09-16 NOTE — Telephone Encounter (Signed)
Pt informed of results. Nothing further needed. 

## 2015-09-16 NOTE — Telephone Encounter (Signed)
-----   Message from Shane Crutch, MD sent at 09/16/2015  8:27 AM EST ----- I reviewed the results of the rib and CXR, please inform pt that they were normal and do not show any bone fracture. So her issue is likely a muscular or nerve problem as we discussed.    ----- Message -----    From: Rad Results In Interface    Sent: 09/15/2015   4:57 PM      To: Shane Crutch, MD

## 2015-09-21 ENCOUNTER — Telehealth: Payer: Self-pay

## 2015-09-21 MED ORDER — HYDROCOD POLST-CPM POLST ER 10-8 MG/5ML PO SUER: 5.0000 mL | Freq: Two times a day (BID) | ORAL | Status: DC | PRN

## 2015-09-21 NOTE — Telephone Encounter (Signed)
Medication has been refilled and ready for patient pick up 

## 2015-09-30 ENCOUNTER — Other Ambulatory Visit: Payer: Self-pay | Admitting: Family Medicine

## 2015-09-30 MED ORDER — ALBUTEROL SULFATE HFA 108 (90 BASE) MCG/ACT IN AERS: 2.0000 | INHALATION_SPRAY | Freq: Four times a day (QID) | RESPIRATORY_TRACT | Status: DC | PRN

## 2015-10-06 ENCOUNTER — Encounter: Payer: Self-pay | Admitting: Family Medicine

## 2015-10-06 ENCOUNTER — Other Ambulatory Visit: Payer: Self-pay | Admitting: Family Medicine

## 2015-10-06 ENCOUNTER — Ambulatory Visit (INDEPENDENT_AMBULATORY_CARE_PROVIDER_SITE_OTHER): Payer: Commercial Managed Care - HMO | Admitting: Family Medicine

## 2015-10-06 ENCOUNTER — Ambulatory Visit: Payer: Commercial Managed Care - HMO | Admitting: Family Medicine

## 2015-10-06 VITALS — BP 130/80 | HR 92 | Temp 97.8°F | Resp 16 | Ht 63.0 in | Wt 184.6 lb

## 2015-10-06 DIAGNOSIS — H6502 Acute serous otitis media, left ear: Secondary | ICD-10-CM

## 2015-10-06 DIAGNOSIS — J40 Bronchitis, not specified as acute or chronic: Secondary | ICD-10-CM | POA: Diagnosis not present

## 2015-10-06 DIAGNOSIS — J209 Acute bronchitis, unspecified: Secondary | ICD-10-CM

## 2015-10-06 MED ORDER — HYDROCOD POLST-CPM POLST ER 10-8 MG/5ML PO SUER: 5.0000 mL | Freq: Two times a day (BID) | ORAL | Status: DC | PRN

## 2015-10-06 MED ORDER — LEVOFLOXACIN 500 MG PO TABS: 500.0000 mg | ORAL_TABLET | Freq: Every day | ORAL | Status: DC

## 2015-10-06 NOTE — Progress Notes (Signed)
Name: Lori Potts   MRN: [id number]    DOB: 10-13-1945   Date:10/06/2015       Progress Note  Subjective  Chief Complaint  Chief Complaint  Patient presents with  . URI    HPI  Patient is here today with concerns regarding the following symptoms sore throat, congestion, sneezing, ear pressure, sinus pressure, non productive cough and achiness that started days ago.  Associated with chills and sweats. Not associated with fever. Has tried the following home remedies: singulair, tylenol.   Past Medical History  Diagnosis Date  . Arthritis   . Hyperlipidemia     takes Lipitor daily  . Essential hypertension, benign     takes Lisinopril-HCTZ daily  . Unspecified essential hypertension   . Hypertension   . Vitamin D deficiency     takes Vit D weekly  . Seasonal allergies     takes Singulair daily as needed  . History of migraine     last one 2-3 months ago  . Weakness     numbness and tingling left arm  . Joint pain   . Low back pain   . History of colon polyps     benign  . Nocturia   . Type II or unspecified type diabetes mellitus without mention of complication, not stated as uncontrolled     Levemir nightly;average fasting blood sugar 125-140  . Diabetes mellitus without complication (HCC)   . Cataract     right eye but immature  . History of shingles   . GERD (gastroesophageal reflux disease)     Social History  Substance Use Topics  . Smoking status: Former Smoker    Types: Cigarettes  . Smokeless tobacco: Never Used     Comment: quit smoking 43yrs ago  . Alcohol Use: No     Current outpatient prescriptions:  .  ACCU-CHEK FASTCLIX LANCETS MISC, , Disp: , Rfl:  .  albuterol (PROVENTIL HFA;VENTOLIN HFA) 108 (90 Base) MCG/ACT inhaler, Inhale 2 puffs into the lungs every 6 (six) hours as needed (for wheezing and shortness of breath from seasonal allergies)., Disp: 1 Inhaler, Rfl: 1 .  Alcohol Swabs (B-D SINGLE USE SWABS REGULAR) PADS, , Disp: , Rfl:  .   atorvastatin (LIPITOR) 40 MG tablet, Take 1 tablet (40 mg total) by mouth daily., Disp: 30 tablet, Rfl: 5 .  Blood Glucose Calibration (ACCU-CHEK SMARTVIEW CONTROL) LIQD, , Disp: , Rfl:  .  chlorpheniramine-HYDROcodone (TUSSIONEX PENNKINETIC ER) 10-8 MG/5ML SUER, Take 5 mLs by mouth every 12 (twelve) hours as needed for cough., Disp: 140 mL, Rfl: 0 .  citalopram (CELEXA) 10 MG tablet, , Disp: , Rfl:  .  gabapentin (NEURONTIN) 300 MG capsule, TAKE 1 CAPSULE BY MOUTH AT BEDTIME FOR 1 WEEK THEN 1 TWICE A DAY FOR 1 WEEK THEN TAKE 3 TIMES A DAY, Disp: 90 capsule, Rfl: 0 .  glucose blood (ACCU-CHEK SMARTVIEW) test strip, TEST BLOOD SUGARS 4 TIMES DAILY, Disp: 150 each, Rfl: PRN .  ibuprofen (ADVIL,MOTRIN) 800 MG tablet, Take 1 tablet (800 mg total) by mouth every 8 (eight) hours as needed., Disp: 30 tablet, Rfl: 0 .  Insulin Detemir (LEVEMIR FLEXTOUCH) 100 UNIT/ML Pen, Inject 15 Units into the skin at bedtime., Disp: , Rfl:  .  Insulin Pen Needle (BD ULTRA-FINE PEN NEEDLES) 29G X 12.7MM MISC, 1 pen by Does not apply route once., Disp: 100 each, Rfl: 3 .  lisinopril-hydrochlorothiazide (PRINZIDE,ZESTORETIC) 20-25 MG per tablet, Take 1 tablet by mouth daily., Disp: ,  Rfl:  .  meloxicam (MOBIC) 15 MG tablet, Take 1 tablet (15 mg total) by mouth daily., Disp: 14 tablet, Rfl: 0 .  methocarbamol (ROBAXIN) 500 MG tablet, Take 1 tablet (500 mg total) by mouth 3 (three) times daily as needed for muscle spasms., Disp: 60 tablet, Rfl: 0 .  montelukast (SINGULAIR) 10 MG tablet, TAKE 1 TABLET BY MOUTH EVERY DAY (Patient taking differently: Take 10 mg by mouth once daily), Disp: 30 tablet, Rfl: 0 .  ondansetron (ZOFRAN) 4 MG tablet, Take 1 tablet (4 mg total) by mouth every 8 (eight) hours as needed for nausea or vomiting., Disp: 20 tablet, Rfl: 0 .  oxyCODONE-acetaminophen (PERCOCET) 10-325 MG tablet, Take 1 tablet by mouth every 4 (four) hours as needed for pain., Disp: , Rfl:  .  Vitamin D, Ergocalciferol, (DRISDOL)  50000 UNITS CAPS capsule, Take 1 capsule (50,000 Units total) by mouth every 7 (seven) days. On the 1st of each month (Patient taking differently: Take 50,000 Units by mouth every 7 (seven) days. On Monday.), Disp: 12 capsule, Rfl: 0  Allergies  Allergen Reactions  . Adhesive [Tape] Rash and Other (See Comments)    Regular tape is ok, allergy is to paper tape    ROS  Positive for fatigue, nasal congestion, sinus pressure, ear fullness, cough as mentioned in HPI, otherwise all systems reviewed and are negative.  Objective  Filed Vitals:   10/06/15 1538  BP: 130/80  Pulse: 92  Temp: 97.8 F (36.6 C)  TempSrc: Oral  Resp: 16  Height:  (1.6 m)  Weight: 184 lb 9.6 oz (83.734 kg)  SpO2: 97%   Body mass index is 32.71 kg/(m^2).   Physical Exam  Constitutional: Patient appears well-developed and well-nourished. In no acute distress but does appear to be fatigued from acute illness. HEENT:  - Head: Normocephalic and atraumatic.  - Ears: RIGHT TM bulging with minimal clear exudate, LEFT TM bulging, erythema with minimal clear exudate.  - Nose: Nasal mucosa boggy and congested.  - Mouth/Throat: Oropharynx is moist with slight erythema of bilateral tonsils without hypertrophy or exudates. Post nasal drainage present.  - Eyes: Conjunctivae clear, EOM movements normal. PERRLA. No scleral icterus.  Neck: Normal range of motion. Neck supple. No JVD present. No thyromegaly present. No local lymphadenopathy. Cardiovascular: Regular rate, regular rhythm with no murmurs heard.  Pulmonary/Chest: Effort normal and moving good air with end expiratory wheezing and coarse rhonchi scattered.  Musculoskeletal: Normal range of motion bilateral UE and LE, no joint effusions. Skin: Skin is warm and dry. No rash noted. Psychiatric: Patient has a normal mood and affect. Behavior is normal in office today. Judgment and thought content normal in office today.   Assessment & Plan   1. Bronchitis  with bronchospasm  - chlorpheniramine-HYDROcodone (TUSSIONEX PENNKINETIC ER) 10-8 MG/5ML SUER; Take 5 mLs by mouth every 12 (twelve) hours as needed for cough.  Dispense: 115 mL; Refill: 0 - levofloxacin (LEVAQUIN) 500 MG tablet; Take 1 tablet (500 mg total) by mouth daily.  Dispense: 10 tablet; Refill: 0  2. Acute serous otitis media of left ear, recurrence not specified  - chlorpheniramine-HYDROcodone (TUSSIONEX PENNKINETIC ER) 10-8 MG/5ML SUER; Take 5 mLs by mouth every 12 (twelve) hours as needed for cough.  Dispense: 115 mL; Refill: 0 - levofloxacin (LEVAQUIN) 500 MG tablet; Take 1 tablet (500 mg total) by mouth daily.  Dispense: 10 tablet; Refill: 0

## 2015-10-19 DIAGNOSIS — Z4789 Encounter for other orthopedic aftercare: Secondary | ICD-10-CM | POA: Diagnosis not present

## 2015-10-21 ENCOUNTER — Other Ambulatory Visit: Payer: Self-pay | Admitting: Family Medicine

## 2015-10-22 ENCOUNTER — Telehealth: Payer: Self-pay

## 2015-10-22 NOTE — Telephone Encounter (Signed)
[facility] called requesting a referral for Mrs. Char to see Dr. Thomasena Edis. [employer] Auth Obtained Dr. Thomasena Edis, ICD-10:M25.561 Auth # [id number] Start - 10/22/2015 Expires - 04/19/16

## 2015-10-28 ENCOUNTER — Other Ambulatory Visit: Payer: Self-pay

## 2015-10-28 DIAGNOSIS — J209 Acute bronchitis, unspecified: Secondary | ICD-10-CM

## 2015-10-28 DIAGNOSIS — H6502 Acute serous otitis media, left ear: Secondary | ICD-10-CM

## 2015-10-28 MED ORDER — HYDROCOD POLST-CPM POLST ER 10-8 MG/5ML PO SUER: 5.0000 mL | Freq: Two times a day (BID) | ORAL | Status: DC | PRN

## 2015-10-29 ENCOUNTER — Other Ambulatory Visit: Payer: Self-pay | Admitting: Family Medicine

## 2015-10-29 DIAGNOSIS — J209 Acute bronchitis, unspecified: Secondary | ICD-10-CM

## 2015-10-29 DIAGNOSIS — H6502 Acute serous otitis media, left ear: Secondary | ICD-10-CM

## 2015-10-29 MED ORDER — HYDROCOD POLST-CPM POLST ER 10-8 MG/5ML PO SUER: 5.0000 mL | Freq: Two times a day (BID) | ORAL | Status: DC | PRN

## 2015-11-03 DIAGNOSIS — G894 Chronic pain syndrome: Secondary | ICD-10-CM | POA: Diagnosis not present

## 2015-11-03 DIAGNOSIS — M961 Postlaminectomy syndrome, not elsewhere classified: Secondary | ICD-10-CM | POA: Diagnosis not present

## 2015-11-03 DIAGNOSIS — Z79891 Long term (current) use of opiate analgesic: Secondary | ICD-10-CM | POA: Diagnosis not present

## 2015-11-03 DIAGNOSIS — M50122 Cervical disc disorder at C5-C6 level with radiculopathy: Secondary | ICD-10-CM | POA: Diagnosis not present

## 2015-11-23 ENCOUNTER — Ambulatory Visit
Admission: EM | Admit: 2015-11-23 | Discharge: 2015-11-23 | Disposition: A | Discharge status: Home / Self Care | Payer: Commercial Managed Care - HMO | Attending: Family Medicine | Admitting: Family Medicine

## 2015-11-23 ENCOUNTER — Encounter: Payer: Self-pay | Admitting: *Deleted

## 2015-11-23 DIAGNOSIS — J0100 Acute maxillary sinusitis, unspecified: Secondary | ICD-10-CM | POA: Diagnosis not present

## 2015-11-23 DIAGNOSIS — J40 Bronchitis, not specified as acute or chronic: Secondary | ICD-10-CM

## 2015-11-23 LAB — RAPID INFLUENZA A&B ANTIGENS
Influenza A (ARMC): NEGATIVE
Influenza B (ARMC): NEGATIVE

## 2015-11-23 MED ORDER — HYDROCOD POLST-CPM POLST ER 10-8 MG/5ML PO SUER
5.0000 mL | Freq: Two times a day (BID) | ORAL | Status: DC | PRN
Start: 2015-11-23 — End: 2015-12-01

## 2015-11-23 MED ORDER — IPRATROPIUM-ALBUTEROL 0.5-2.5 (3) MG/3ML IN SOLN
3.0000 mL | Freq: Once | RESPIRATORY_TRACT | Status: AC
  Administered 2015-11-23: 3 mL via RESPIRATORY_TRACT

## 2015-11-23 MED ORDER — AMOXICILLIN 875 MG PO TABS: 875.0000 mg | ORAL_TABLET | Freq: Two times a day (BID) | ORAL | Status: DC

## 2015-11-23 NOTE — ED Provider Notes (Signed)
CSN: [id number]     Arrival date & time 11/23/15  0910 History   First MD Initiated Contact with Patient 11/23/15 [phone]     Chief Complaint  Patient presents with  . Cough  . Headache  . Nasal Congestion  . Pleurisy   (Consider location/radiation/quality/duration/timing/severity/associated sxs/prior Treatment) Patient is a 70 y.o. female presenting with URI. The history is provided by the patient.  URI Presenting symptoms: congestion, cough, facial pain, fatigue and rhinorrhea   Severity:  Moderate Onset quality:  Sudden Duration:  2 weeks Timing:  Constant Progression:  Worsening Chronicity:  New Relieved by:  Nothing Ineffective treatments:  OTC medications Associated symptoms: headaches, sinus pain and wheezing   Risk factors: being elderly, diabetes mellitus, recent illness and sick contacts   Risk factors: no chronic cardiac disease, no chronic kidney disease, no chronic respiratory disease, no immunosuppression and no recent travel     Past Medical History  Diagnosis Date  . Arthritis   . Hyperlipidemia     takes Lipitor daily  . Essential hypertension, benign     takes Lisinopril-HCTZ daily  . Unspecified essential hypertension   . Hypertension   . Vitamin D deficiency     takes Vit D weekly  . Seasonal allergies     takes Singulair daily as needed  . History of migraine     last one 2-3 months ago  . Weakness     numbness and tingling left arm  . Joint pain   . Low back pain   . History of colon polyps     benign  . Nocturia   . Type II or unspecified type diabetes mellitus without mention of complication, not stated as uncontrolled     Levemir nightly;average fasting blood sugar 125-140  . Diabetes mellitus without complication ([place])   . Cataract     right eye but immature  . History of shingles   . GERD (gastroesophageal reflux disease)    Past Surgical History  Procedure Laterality Date  . Back surgery    . Total knee arthroplasty Left   . Small  bowel repair    . Kidney surgery  1998    growth removed from left kidney   . Abdominal hysterectomy    . Breast surgery  1990    Augementation  . Back surgery      Lumbar fusion x 2  . Revision of scar tissue rectus muscle    . Pain stimulator    . Spinal cord stimulator battery exchange N/A 10/17/2012    Procedure: SPINAL CORD STIMULATOR BATTERY REMOVAL;  Surgeon: Melina Schools, MD;  Location: [facility];  Service: Orthopedics;  Laterality: N/A;  . Anterior lat lumbar fusion N/A 03/21/2013    Procedure: ANTERIOR LATERAL LUMBAR FUSION 1 LEVEL/ XLIF L3-L4 ;  Surgeon: Melina Schools, MD;  Location: [facility];  Service: Orthopedics;  Laterality: N/A;  . Posterior cervical fusion/foraminotomy Right 03/21/2013    Procedure: POSTERIOR L2-3 RIGHT FORAMINOTOMY;  Surgeon: Melina Schools, MD;  Location: [facility];  Service: Orthopedics;  Laterality: Right;  . Cardiac catheterization  5/12    ef 55%  . Cardiac catheterization  10/2010    [facility]; EF 55%  . Appendectomy    . Colonoscopy    . Esophagogastroduodenoscopy    . Anterior cervical decomp/discectomy fusion N/A 02/26/2015    Procedure: ANTERIOR CERVICAL DISCECTOMY FUSION C4-5 (1 LEVEL);  Surgeon: Melina Schools, MD;  Location: [facility];  Service: Orthopedics;  Laterality: N/A;  . Spinal  cord stimulator battery exchange N/A 07/22/2015    Procedure: REIMPLANTATION OF SPINAL CORD STIMULATOR BATTERY ;  Surgeon: Melina Schools, MD;  Location: [facility];  Service: Orthopedics;  Laterality: N/A;   Family History  Problem Relation Age of Onset  . Heart attack Mother    Social History  Substance Use Topics  . Smoking status: Former Smoker    Types: Cigarettes  . Smokeless tobacco: Never Used     Comment: quit smoking 42yr ago  . Alcohol Use: No   OB History    Gravida Para Term Preterm AB TAB SAB Ectopic Multiple Living   0 0 0 0 0 0 0 0       Review of Systems  Constitutional: Positive for fatigue.  HENT: Positive for congestion and rhinorrhea.   Respiratory:  Positive for cough and wheezing.   Neurological: Positive for headaches.    Allergies  Adhesive  Home Medications   Prior to Admission medications   Medication Sig Start Date End Date Taking? Authorizing Provider  ACCU-CHEK FASTCLIX LANCETS [facility] 08/04/15  Yes Historical Provider, MD  albuterol (PROVENTIL HFA;VENTOLIN HFA) 108 (90 Base) MCG/ACT inhaler Inhale 2 puffs into the lungs every 6 (six) hours as needed (for wheezing and shortness of breath from seasonal allergies). 09/30/15  Yes LAshok Norris MD  Alcohol Swabs (B-D SINGLE USE SWABS REGULAR) PADS  08/04/15  Yes Historical Provider, MD  atorvastatin (LIPITOR) 40 MG tablet TAKE 1 TABLET EVERY DAY 10/21/15  Yes LAshok Norris MD  Blood Glucose Calibration (ACCU-CHEK SMARTVIEW CONTROL) LIQD  08/04/15  Yes Historical Provider, MD  Blood Glucose Monitoring Suppl (ACCU-CHEK NANO SMARTVIEW) w/Device KIT CHECK BLOOD SUGAR TWICE DAILY 10/21/15  Yes LAshok Norris MD  fluticasone (FLONASE) 50 MCG/ACT nasal spray USE 1 SPRAY IN EACH NOSTRIL TWICE DAILY 10/21/15  Yes LAshok Norris MD  glucose blood (ACCU-CHEK SMARTVIEW) test strip TEST BLOOD SUGARS 4 TIMES DAILY 08/07/15  Yes LAshok Norris MD  ibuprofen (ADVIL,MOTRIN) 800 MG tablet Take 1 tablet (800 mg total) by mouth every 8 (eight) hours as needed. 08/04/15  Yes LAshok Norris MD  Insulin Pen Needle (BD ULTRA-FINE PEN NEEDLES) 29G X 12.7MM MISC 1 pen by Does not apply route once. 03/11/15  Yes LAshok Norris MD  LEVEMIR FLEXTOUCH 100 UNIT/ML Pen INJECT  5 UNITS SUBCUTANEOUSLY AT BEDTIME (DISCARD PEN 42 DAYS AFTER OPENING) 10/21/15  Yes ABobetta Lime MD  lisinopril-hydrochlorothiazide (PRINZIDE,ZESTORETIC) 20-25 MG tablet TAKE 1 TABLET EVERY DAY 10/21/15  Yes LAshok Norris MD  methocarbamol (ROBAXIN) 500 MG tablet Take 1 tablet (500 mg total) by mouth 3 (three) times daily as needed for muscle spasms. 02/26/15  Yes DMelina Schools MD  oxyCODONE-acetaminophen (PERCOCET) 10-325 MG tablet  Take 1 tablet by mouth every 4 (four) hours as needed for pain.   Yes Historical Provider, MD  Vitamin D, Ergocalciferol, (DRISDOL) 50000 units CAPS capsule TAKE ONE CAPSULE BY MOUTH EVERY 7 DAYS 10/07/15  Yes LAshok Norris MD  amoxicillin (AMOXIL) 875 MG tablet Take 1 tablet (875 mg total) by mouth 2 (two) times daily. 11/23/15   ONorval Gable MD  chlorpheniramine-HYDROcodone (TUSSIONEX PENNKINETIC ER) 10-8 MG/5ML SUER Take 5 mLs by mouth every 12 (twelve) hours as needed. 11/23/15   ONorval Gable MD  citalopram (CELEXA) 10 MG tablet TAKE 1 TABLET EVERY DAY  FOR  1  WEEK  THEN TAKE 2 TABLETS EVERY DAY 10/21/15   LAshok Norris MD  gabapentin (NEURONTIN) 300 MG capsule TAKE 1 CAPSULE BY MOUTH AT BEDTIME FOR 1  WEEK THEN 1 TWICE A DAY FOR 1 WEEK THEN TAKE 3 TIMES A DAY 08/24/15   Ashok Norris, MD  meloxicam (MOBIC) 15 MG tablet Take 1 tablet (15 mg total) by mouth daily. 09/01/15   Ashok Norris, MD  montelukast (SINGULAIR) 10 MG tablet TAKE 1 TABLET EVERY DAY 10/21/15   Ashok Norris, MD  ondansetron (ZOFRAN) 4 MG tablet TAKE 1 TABLET EVERY 8 HOURS AS NEEDED FOR NAUSEA OR VOMITING 10/21/15   Ashok Norris, MD   Meds Ordered and Administered this Visit   Medications  ipratropium-albuterol (DUONEB) 0.5-2.5 (3) MG/3ML nebulizer solution 3 mL (3 mLs Nebulization Given 11/23/15 1005)    BP 131/67 mmHg  Pulse 86  Temp(Src) 97.6 F (36.4 C) (Oral)  Resp 16  Ht _0  (1.6 m)  Wt 183 lb (83.008 kg)  BMI 32.43 kg/m2  SpO2 98% No data found.   Physical Exam  Constitutional: She appears well-developed and well-nourished. No distress.  HENT:  Head: Normocephalic and atraumatic.  Right Ear: Tympanic membrane, external ear and ear canal normal.  Left Ear: Tympanic membrane, external ear and ear canal normal.  Nose: Mucosal edema and rhinorrhea present. No nose lacerations, sinus tenderness, nasal deformity, septal deviation or nasal septal hematoma. No epistaxis.  No foreign bodies. Right sinus  exhibits maxillary sinus tenderness and frontal sinus tenderness. Left sinus exhibits maxillary sinus tenderness and frontal sinus tenderness.  Mouth/Throat: Uvula is midline, oropharynx is clear and moist and mucous membranes are normal. No oropharyngeal exudate.  Eyes: Conjunctivae and EOM are normal. Pupils are equal, round, and reactive to light. Right eye exhibits no discharge. Left eye exhibits no discharge. No scleral icterus.  Neck: Normal range of motion. Neck supple. No thyromegaly present.  Cardiovascular: Normal rate, regular rhythm and normal heart sounds.   Pulmonary/Chest: Effort normal and breath sounds normal. No respiratory distress. She has no wheezes. She has no rales.  Lymphadenopathy:    She has no cervical adenopathy.  Skin: She is not diaphoretic.  Nursing note and vitals reviewed.   ED Course  Procedures (including critical care time)  Labs Review Labs Reviewed  RAPID INFLUENZA A&B ANTIGENS ([place])    Imaging Review No results found.   Visual Acuity Review  Right Eye Distance:   Left Eye Distance:   Bilateral Distance:    Right Eye Near:   Left Eye Near:    Bilateral Near:         MDM   1. Acute maxillary sinusitis, recurrence not specified   2. Bronchitis    Discharge Medication List as of 11/23/2015 10:31 AM    START taking these medications   Details  amoxicillin (AMOXIL) 875 MG tablet Take 1 tablet (875 mg total) by mouth 2 (two) times daily., Starting 11/23/2015, Until Discontinued, Normal       1. diagnosis reviewed with patient; patient given Duoneb x1 with improvement of symptoms 2. rx as per orders above; reviewed possible side effects, interactions, risks and benefits  3. Recommend supportive treatment with rest, increased fluids 4. Follow-up prn if symptoms worsen or don't improve    Norval Gable, MD 11/23/15 1204

## 2015-11-23 NOTE — ED Notes (Signed)
Productive cough- clear, headache, runny nose, and chest pain with coughing/movement x2 weeks, worse past 2-3 days.

## 2015-11-30 DIAGNOSIS — S8001XD Contusion of right knee, subsequent encounter: Secondary | ICD-10-CM | POA: Diagnosis not present

## 2015-12-01 ENCOUNTER — Encounter: Payer: Self-pay | Admitting: Physician Assistant

## 2015-12-01 ENCOUNTER — Ambulatory Visit (INDEPENDENT_AMBULATORY_CARE_PROVIDER_SITE_OTHER): Payer: Commercial Managed Care - HMO | Admitting: Physician Assistant

## 2015-12-01 VITALS — BP 120/70 | HR 95 | Temp 98.1°F | Resp 16 | Wt 178.8 lb

## 2015-12-01 DIAGNOSIS — R05 Cough: Secondary | ICD-10-CM | POA: Diagnosis not present

## 2015-12-01 DIAGNOSIS — J0140 Acute pansinusitis, unspecified: Secondary | ICD-10-CM

## 2015-12-01 DIAGNOSIS — R059 Cough, unspecified: Secondary | ICD-10-CM

## 2015-12-01 DIAGNOSIS — J40 Bronchitis, not specified as acute or chronic: Secondary | ICD-10-CM

## 2015-12-01 DIAGNOSIS — H6503 Acute serous otitis media, bilateral: Secondary | ICD-10-CM

## 2015-12-01 MED ORDER — IPRATROPIUM-ALBUTEROL 0.5-2.5 (3) MG/3ML IN SOLN: 3.0000 mL | RESPIRATORY_TRACT | Status: DC | PRN

## 2015-12-01 MED ORDER — HYDROCOD POLST-CPM POLST ER 10-8 MG/5ML PO SUER: 5.0000 mL | Freq: Two times a day (BID) | ORAL | Status: DC | PRN

## 2015-12-01 MED ORDER — PREDNISONE 10 MG PO TABS: ORAL_TABLET | ORAL | Status: DC

## 2015-12-01 NOTE — Progress Notes (Signed)
Patient: Lori Potts Female    DOB: July 08, 1946   70 y.o.   MRN: [id number] Visit Date: 12/01/2015  Today's Provider: Mar Daring, PA-C   Chief Complaint  Patient presents with  . URI   Subjective:    URI  This is a new problem. The current episode started 1 to 4 weeks ago (4 Weeks ago she started with the congestion.). The problem has been gradually worsening. There has been no fever. Associated symptoms include congestion, coughing, headaches, rhinorrhea, sneezing, vomiting (after the cough, She reports vomiting 7 times last night) and wheezing. Pertinent negatives include no abdominal pain, chest pain, ear pain, nausea, plugged ear sensation or sore throat. She has tried decongestant, increased fluids and inhaler use (Tussionex, Amox and neb solution) for the symptoms. The treatment provided no relief.  She has recently been sick starting in January where she developed pleurisy secondary to bronchitis and was seen by Pulmonology on 09/15/15 and had normal CXR. She then was seen again on 10/06/15 and given levaquin and tussionex for sinusitis, otits media and cough by Dr. Nadine Counts. She states she got a little better but only for about two weeks and symptoms onset again. She was then seen at [facility] on 11/23/15 and diagnosed with sinusitis and bronchitis. She was given treatment with Duoneb nebulizer and had improvement. She was also given amoxicillin '875mg'$  BID. She is still taking the amoxicillin. She states she has about 3 days left of treatment. She has been using her albuterol rescue inhaler approx 4 times daily. She does have albuterol nebulizer as well that she has been using but states she sees no benefit.      Allergies  Allergen Reactions  . Adhesive [Tape] Rash and Other (See Comments)    Regular tape is ok, allergy is to paper tape   Previous Medications   ACCU-CHEK FASTCLIX LANCETS MISC       ALBUTEROL (PROVENTIL HFA;VENTOLIN HFA) 108 (90 BASE) MCG/ACT  INHALER    Inhale 2 puffs into the lungs every 6 (six) hours as needed (for wheezing and shortness of breath from seasonal allergies).   ALBUTEROL (PROVENTIL) (2.5 MG/3ML) 0.083% NEBULIZER SOLUTION       ALCOHOL SWABS (B-D SINGLE USE SWABS REGULAR) PADS       AMOXICILLIN (AMOXIL) 875 MG TABLET    Take 1 tablet (875 mg total) by mouth 2 (two) times daily.   ATORVASTATIN (LIPITOR) 40 MG TABLET    TAKE 1 TABLET EVERY DAY   BLOOD GLUCOSE CALIBRATION (ACCU-CHEK SMARTVIEW CONTROL) LIQD       BLOOD GLUCOSE MONITORING SUPPL (ACCU-CHEK NANO SMARTVIEW) W/DEVICE KIT    CHECK BLOOD SUGAR TWICE DAILY   CHLORPHENIRAMINE-HYDROCODONE (TUSSIONEX PENNKINETIC ER) 10-8 MG/5ML SUER    Take 5 mLs by mouth every 12 (twelve) hours as needed.   CITALOPRAM (CELEXA) 10 MG TABLET    TAKE 1 TABLET EVERY DAY  FOR  1  WEEK  THEN TAKE 2 TABLETS EVERY DAY   FLUTICASONE (FLONASE) 50 MCG/ACT NASAL SPRAY    USE 1 SPRAY IN EACH NOSTRIL TWICE DAILY   GLUCOSE BLOOD (ACCU-CHEK SMARTVIEW) TEST STRIP    TEST BLOOD SUGARS 4 TIMES DAILY   IBUPROFEN (ADVIL,MOTRIN) 800 MG TABLET    Take 1 tablet (800 mg total) by mouth every 8 (eight) hours as needed.   INSULIN PEN NEEDLE (BD ULTRA-FINE PEN NEEDLES) 29G X 12.7MM MISC    1 pen by Does not apply route once.  LEVEMIR FLEXTOUCH 100 UNIT/ML PEN    INJECT  5 UNITS SUBCUTANEOUSLY AT BEDTIME (DISCARD PEN 42 DAYS AFTER OPENING)   LISINOPRIL-HYDROCHLOROTHIAZIDE (PRINZIDE,ZESTORETIC) 20-25 MG TABLET    TAKE 1 TABLET EVERY DAY   MELOXICAM (MOBIC) 15 MG TABLET    Take 1 tablet (15 mg total) by mouth daily.   METHOCARBAMOL (ROBAXIN) 500 MG TABLET    Take 1 tablet (500 mg total) by mouth 3 (three) times daily as needed for muscle spasms.   MONTELUKAST (SINGULAIR) 10 MG TABLET    TAKE 1 TABLET EVERY DAY   ONDANSETRON (ZOFRAN) 4 MG TABLET    TAKE 1 TABLET EVERY 8 HOURS AS NEEDED FOR NAUSEA OR VOMITING   OXYCODONE-ACETAMINOPHEN (PERCOCET) 10-325 MG TABLET    Take 1 tablet by mouth every 4 (four) hours as  needed for pain.   VITAMIN D, ERGOCALCIFEROL, (DRISDOL) 50000 UNITS CAPS CAPSULE    TAKE ONE CAPSULE BY MOUTH EVERY 7 DAYS    Review of Systems  Constitutional: Negative for fever and fatigue.  HENT: Positive for congestion, postnasal drip, rhinorrhea, sinus pressure, sneezing and voice change. Negative for ear pain and sore throat.   Respiratory: Positive for cough, chest tightness, shortness of breath and wheezing.   Cardiovascular: Negative for chest pain, palpitations and leg swelling.  Gastrointestinal: Positive for vomiting (after the cough, She reports vomiting 7 times last night). Negative for nausea and abdominal pain.  Neurological: Positive for headaches. Negative for dizziness and light-headedness.    Social History  Substance Use Topics  . Smoking status: Former Smoker    Types: Cigarettes  . Smokeless tobacco: Never Used     Comment: quit smoking 56yr ago  . Alcohol Use: No   Objective:   BP 120/70 mmHg  Pulse 95  Temp(Src) 98.1 F (36.7 C) (Oral)  Resp 16  Wt 178 lb 12.8 oz (81.103 kg)  SpO2 95%  Physical Exam  Constitutional: She appears well-developed and well-nourished. No distress.  HENT:  Head: Normocephalic and atraumatic.  Right Ear: Hearing, external ear and ear canal normal. Tympanic membrane is erythematous and bulging. A middle ear effusion is present.  Left Ear: Hearing, external ear and ear canal normal. Tympanic membrane is retracted. Tympanic membrane is not erythematous and not bulging. A middle ear effusion is present.  Nose: Mucosal edema and rhinorrhea present. Right sinus exhibits maxillary sinus tenderness and frontal sinus tenderness. Left sinus exhibits maxillary sinus tenderness and frontal sinus tenderness.  Mouth/Throat: Uvula is midline, oropharynx is clear and moist and mucous membranes are normal. No oropharyngeal exudate, posterior oropharyngeal edema or posterior oropharyngeal erythema (cobblestoning present from drainage).  Eyes:  Conjunctivae are normal. Pupils are equal, round, and reactive to light. Right eye exhibits no discharge. Left eye exhibits no discharge. No scleral icterus.  Neck: Normal range of motion. Neck supple. No tracheal deviation present. No thyromegaly present.  Cardiovascular: Normal rate, regular rhythm and normal heart sounds.  Exam reveals no gallop and no friction rub.   No murmur heard. Pulmonary/Chest: Effort normal. No stridor. No respiratory distress. She has wheezes (insp and exp throughout). She has no rhonchi. She has no rales.  Lymphadenopathy:    She has no cervical adenopathy.  Skin: Skin is warm and dry. She is not diaphoretic.  Vitals reviewed.       Assessment & Plan:     1. Bronchitis Will give duoneb nebulizer medication for her to have at home to use every 4 hours as needed. She will use this  instead of the albuterol nebulizer medication. Continue albuterol rescue inhaler as directed. Will add oral prednisone taper as below. May consider adding a daily inhaler if still no relief after oral prednisone. She needs to stay well hydrated and try to get plenty of rest. Tussionex given for cough suppression. Drowsiness precautions discussed. She is to continue her amoxicillin until completed. Call if symptoms fail to improve or worsen. - ipratropium-albuterol (DUONEB) 0.5-2.5 (3) MG/3ML SOLN; Take 3 mLs by nebulization every 4 (four) hours as needed.  Dispense: 360 mL; Refill: 3 - predniSONE (DELTASONE) 10 MG tablet; Take 6 tabs PO on day 1&2, 5 tabs PO on day 3&4, 4 tabs PO on day 5&6, 3 tabs PO on day 7&8, 2 tabs PO on day 9&10, 1 tab PO on day 11&12.  Dispense: 42 tablet; Refill: 0  2. Bilateral acute serous otitis media, recurrence not specified See above medical treatment plan.  3. Cough See above medical treatment plan. - chlorpheniramine-HYDROcodone (TUSSIONEX PENNKINETIC ER) 10-8 MG/5ML SUER; Take 5 mLs by mouth every 12 (twelve) hours as needed.  Dispense: 140 mL; Refill:  0  4. Acute pansinusitis, recurrence not specified See above medical treatment plan.       Mar Daring, PA-C  [facility]

## 2015-12-01 NOTE — Patient Instructions (Signed)

## 2015-12-17 ENCOUNTER — Encounter: Payer: Self-pay | Admitting: Family Medicine

## 2015-12-18 ENCOUNTER — Other Ambulatory Visit: Payer: Self-pay | Admitting: Emergency Medicine

## 2015-12-18 MED ORDER — ONDANSETRON HCL 4 MG PO TABS: ORAL_TABLET | ORAL | Status: DC

## 2015-12-29 ENCOUNTER — Other Ambulatory Visit: Payer: Self-pay | Admitting: Family Medicine

## 2015-12-30 ENCOUNTER — Ambulatory Visit (INDEPENDENT_AMBULATORY_CARE_PROVIDER_SITE_OTHER): Payer: Commercial Managed Care - HMO | Admitting: Physician Assistant

## 2015-12-30 ENCOUNTER — Other Ambulatory Visit: Payer: Self-pay | Admitting: Family Medicine

## 2015-12-30 ENCOUNTER — Encounter: Payer: Self-pay | Admitting: Physician Assistant

## 2015-12-30 VITALS — BP 140/78 | HR 82 | Temp 98.1°F | Resp 16 | Wt 182.2 lb

## 2015-12-30 DIAGNOSIS — J0140 Acute pansinusitis, unspecified: Secondary | ICD-10-CM | POA: Diagnosis not present

## 2015-12-30 DIAGNOSIS — J209 Acute bronchitis, unspecified: Secondary | ICD-10-CM

## 2015-12-30 DIAGNOSIS — J40 Bronchitis, not specified as acute or chronic: Secondary | ICD-10-CM | POA: Diagnosis not present

## 2015-12-30 DIAGNOSIS — R05 Cough: Secondary | ICD-10-CM

## 2015-12-30 DIAGNOSIS — R059 Cough, unspecified: Secondary | ICD-10-CM

## 2015-12-30 MED ORDER — AMOXICILLIN-POT CLAVULANATE 875-125 MG PO TABS: 1.0000 | ORAL_TABLET | Freq: Two times a day (BID) | ORAL | Status: DC

## 2015-12-30 MED ORDER — FLUTICASONE-SALMETEROL 250-50 MCG/DOSE IN AEPB: 1.0000 | INHALATION_SPRAY | Freq: Two times a day (BID) | RESPIRATORY_TRACT | Status: DC

## 2015-12-30 MED ORDER — HYDROCOD POLST-CPM POLST ER 10-8 MG/5ML PO SUER: 5.0000 mL | Freq: Two times a day (BID) | ORAL | Status: DC | PRN

## 2015-12-30 NOTE — Progress Notes (Signed)
Patient: Lori Potts Female    DOB: 1946-08-05   70 y.o.   MRN: [id number] Visit Date: 12/30/2015  Today's Provider: Mar Daring, PA-C   Chief Complaint  Patient presents with  . Cough   Subjective:    Cough This is a recurrent problem. The current episode started 1 to 4 weeks ago. The problem has been unchanged. The problem occurs constantly. The cough is non-productive. Associated symptoms include headaches, nasal congestion, postnasal drip, rhinorrhea and wheezing. Pertinent negatives include no chest pain, chills, ear congestion, ear pain, fever, sore throat (only scratchy) or shortness of breath. Associated symptoms comments: Can't sleep. The symptoms are aggravated by lying down. She has tried ipratropium inhaler, leukotriene antagonists, prescription cough suppressant and steroid inhaler (lemon/honey tea, nasal spray, nebulizer) for the symptoms. The treatment provided no relief. Her past medical history is significant for asthma, bronchitis, COPD and environmental allergies.   She was seen with similar symptoms in early April. She states she did improve from that but symptoms returned last week. No fever.    Allergies  Allergen Reactions  . Adhesive [Tape] Rash and Other (See Comments)    Regular tape is ok, allergy is to paper tape   Previous Medications   ACCU-CHEK FASTCLIX LANCETS MISC       ALBUTEROL (PROVENTIL HFA;VENTOLIN HFA) 108 (90 BASE) MCG/ACT INHALER    Inhale 2 puffs into the lungs every 6 (six) hours as needed (for wheezing and shortness of breath from seasonal allergies).   ALBUTEROL (PROVENTIL) (2.5 MG/3ML) 0.083% NEBULIZER SOLUTION       ALCOHOL SWABS (B-D SINGLE USE SWABS REGULAR) PADS       AMOXICILLIN (AMOXIL) 875 MG TABLET    Take 1 tablet (875 mg total) by mouth 2 (two) times daily.   ATORVASTATIN (LIPITOR) 40 MG TABLET    TAKE 1 TABLET EVERY DAY   BLOOD GLUCOSE CALIBRATION (ACCU-CHEK SMARTVIEW CONTROL) LIQD       BLOOD GLUCOSE  MONITORING SUPPL (ACCU-CHEK NANO SMARTVIEW) W/DEVICE KIT    CHECK BLOOD SUGAR TWICE DAILY   CHLORPHENIRAMINE-HYDROCODONE (TUSSIONEX PENNKINETIC ER) 10-8 MG/5ML SUER    Take 5 mLs by mouth every 12 (twelve) hours as needed.   CITALOPRAM (CELEXA) 10 MG TABLET    TAKE 1 TABLET EVERY DAY  FOR  1  WEEK  THEN TAKE 2 TABLETS EVERY DAY   FLUTICASONE (FLONASE) 50 MCG/ACT NASAL SPRAY    USE 1 SPRAY IN EACH NOSTRIL TWICE DAILY   GLUCOSE BLOOD (ACCU-CHEK SMARTVIEW) TEST STRIP    TEST BLOOD SUGARS 4 TIMES DAILY   IBUPROFEN (ADVIL,MOTRIN) 800 MG TABLET    Take 1 tablet (800 mg total) by mouth every 8 (eight) hours as needed.   INSULIN PEN NEEDLE (BD ULTRA-FINE PEN NEEDLES) 29G X 12.7MM MISC    1 pen by Does not apply route once.   IPRATROPIUM-ALBUTEROL (DUONEB) 0.5-2.5 (3) MG/3ML SOLN    Take 3 mLs by nebulization every 4 (four) hours as needed.   LEVEMIR FLEXTOUCH 100 UNIT/ML PEN    INJECT  5 UNITS SUBCUTANEOUSLY AT BEDTIME (DISCARD PEN 42 DAYS AFTER OPENING)   LISINOPRIL-HYDROCHLOROTHIAZIDE (PRINZIDE,ZESTORETIC) 20-25 MG TABLET    TAKE 1 TABLET EVERY DAY   MELOXICAM (MOBIC) 15 MG TABLET    Take 1 tablet (15 mg total) by mouth daily.   METHOCARBAMOL (ROBAXIN) 500 MG TABLET    Take 1 tablet (500 mg total) by mouth 3 (three) times daily as needed for muscle spasms.  MONTELUKAST (SINGULAIR) 10 MG TABLET    TAKE 1 TABLET EVERY DAY   ONDANSETRON (ZOFRAN) 4 MG TABLET    TAKE 1 TABLET EVERY 8 HOURS AS NEEDED FOR NAUSEA OR VOMITING   OXYCODONE-ACETAMINOPHEN (PERCOCET) 10-325 MG TABLET    Take 1 tablet by mouth every 4 (four) hours as needed for pain.   PREDNISONE (DELTASONE) 10 MG TABLET    Take 6 tabs PO on day 1&2, 5 tabs PO on day 3&4, 4 tabs PO on day 5&6, 3 tabs PO on day 7&8, 2 tabs PO on day 9&10, 1 tab PO on day 11&12.   VITAMIN D, ERGOCALCIFEROL, (DRISDOL) 50000 UNITS CAPS CAPSULE    TAKE ONE CAPSULE BY MOUTH EVERY 7 DAYS    Review of Systems  Constitutional: Positive for fatigue (very tired). Negative for  fever and chills.  HENT: Positive for congestion, postnasal drip, rhinorrhea, sinus pressure and sneezing. Negative for ear pain and sore throat (only scratchy).   Respiratory: Positive for cough and wheezing. Negative for chest tightness and shortness of breath.   Cardiovascular: Negative for chest pain, palpitations and leg swelling.  Gastrointestinal: Negative for nausea, vomiting, abdominal pain and diarrhea.  Allergic/Immunologic: Positive for environmental allergies.  Neurological: Positive for headaches. Negative for dizziness.    Social History  Substance Use Topics  . Smoking status: Former Smoker    Types: Cigarettes  . Smokeless tobacco: Never Used     Comment: quit smoking 17yrs ago  . Alcohol Use: No   Objective:   BP 140/78 mmHg  Pulse 82  Temp(Src) 98.1 F (36.7 C) (Oral)  Resp 16  Wt 182 lb 3.2 oz (82.645 kg)  SpO2 96%  Physical Exam  Constitutional: She appears well-developed and well-nourished. No distress.  HENT:  Head: Normocephalic and atraumatic.  Right Ear: Hearing, external ear and ear canal normal. Tympanic membrane is not erythematous and not bulging. A middle ear effusion is present.  Left Ear: Hearing, external ear and ear canal normal. Tympanic membrane is not erythematous and not bulging. A middle ear effusion is present.  Nose: Mucosal edema present. No rhinorrhea. Right sinus exhibits maxillary sinus tenderness and frontal sinus tenderness. Left sinus exhibits maxillary sinus tenderness and frontal sinus tenderness.  Mouth/Throat: Uvula is midline, oropharynx is clear and moist and mucous membranes are normal. No oropharyngeal exudate, posterior oropharyngeal edema or posterior oropharyngeal erythema.  Eyes: Conjunctivae are normal. Pupils are equal, round, and reactive to light. Right eye exhibits no discharge. Left eye exhibits no discharge. No scleral icterus.  Neck: Normal range of motion. Neck supple. No tracheal deviation present. No  thyromegaly present.  Cardiovascular: Normal rate, regular rhythm and normal heart sounds.  Exam reveals no gallop and no friction rub.   No murmur heard. Pulmonary/Chest: Effort normal. No stridor. No respiratory distress. She has wheezes (expiratory). She has no rales. She exhibits no tenderness.  Lymphadenopathy:    She has no cervical adenopathy.  Skin: Skin is warm and dry. She is not diaphoretic.  Vitals reviewed.       Assessment & Plan:     1. Cough Worsening symptoms that has not responded to OTC medications. Will give Tussionex cough syrup as below for nighttime cough. Drowsiness precautions given to patient. Stay well hydrated. Use delsym, robitussin OR mucinex for daytime cough. - chlorpheniramine-HYDROcodone (TUSSIONEX PENNKINETIC ER) 10-8 MG/5ML SUER; Take 5 mLs by mouth every 12 (twelve) hours as needed.  Dispense: 240 mL; Refill: 0  2. Acute pansinusitis, recurrence not specified  Worsening symptoms that have not responded to OTC medications. Will give augmentin as below. Continue allergy medications. Stay well hydrated and get plenty of rest. Call if no symptom improvement or if symptoms worsen. - amoxicillin-clavulanate (AUGMENTIN) 875-125 MG tablet; Take 1 tablet by mouth 2 (two) times daily.  Dispense: 20 tablet; Refill: 0  3. Bronchitis with bronchospasm Will give sample of Advair as below to see if this helps cough and symptoms of chest tightness. Hopefully this will help her decrease her use of her nebulizer and albuterol inhaler. She is to call if this medication works well and we will send in Rx to pharmacy on file. - Fluticasone-Salmeterol (ADVAIR DISKUS) 250-50 MCG/DOSE AEPB; Inhale 1 puff into the lungs 2 (two) times daily.  Dispense: 14 each; Refill: [place], PA-C  [facility]

## 2015-12-30 NOTE — Patient Instructions (Signed)

## 2016-01-11 DIAGNOSIS — K219 Gastro-esophageal reflux disease without esophagitis: Secondary | ICD-10-CM | POA: Diagnosis not present

## 2016-01-11 DIAGNOSIS — E119 Type 2 diabetes mellitus without complications: Secondary | ICD-10-CM | POA: Diagnosis not present

## 2016-01-11 DIAGNOSIS — R0602 Shortness of breath: Secondary | ICD-10-CM | POA: Diagnosis not present

## 2016-01-11 DIAGNOSIS — I10 Essential (primary) hypertension: Secondary | ICD-10-CM | POA: Diagnosis not present

## 2016-01-11 DIAGNOSIS — E785 Hyperlipidemia, unspecified: Secondary | ICD-10-CM | POA: Diagnosis not present

## 2016-01-11 DIAGNOSIS — I209 Angina pectoris, unspecified: Secondary | ICD-10-CM | POA: Diagnosis not present

## 2016-01-11 DIAGNOSIS — I208 Other forms of angina pectoris: Secondary | ICD-10-CM | POA: Diagnosis not present

## 2016-01-16 ENCOUNTER — Emergency Department: Payer: Commercial Managed Care - HMO

## 2016-01-16 ENCOUNTER — Emergency Department
Admission: EM | Admit: 2016-01-16 | Discharge: 2016-01-16 | Disposition: A | Discharge status: Home / Self Care | Payer: Commercial Managed Care - HMO | Attending: Emergency Medicine | Admitting: Emergency Medicine

## 2016-01-16 DIAGNOSIS — Z79899 Other long term (current) drug therapy: Secondary | ICD-10-CM | POA: Insufficient documentation

## 2016-01-16 DIAGNOSIS — S5012XA Contusion of left forearm, initial encounter: Secondary | ICD-10-CM | POA: Diagnosis not present

## 2016-01-16 DIAGNOSIS — Y92019 Unspecified place in single-family (private) house as the place of occurrence of the external cause: Secondary | ICD-10-CM | POA: Insufficient documentation

## 2016-01-16 DIAGNOSIS — E119 Type 2 diabetes mellitus without complications: Secondary | ICD-10-CM | POA: Diagnosis not present

## 2016-01-16 DIAGNOSIS — M7989 Other specified soft tissue disorders: Secondary | ICD-10-CM | POA: Diagnosis not present

## 2016-01-16 DIAGNOSIS — Y939 Activity, unspecified: Secondary | ICD-10-CM | POA: Diagnosis not present

## 2016-01-16 DIAGNOSIS — M1990 Unspecified osteoarthritis, unspecified site: Secondary | ICD-10-CM | POA: Insufficient documentation

## 2016-01-16 DIAGNOSIS — Z87891 Personal history of nicotine dependence: Secondary | ICD-10-CM | POA: Diagnosis not present

## 2016-01-16 DIAGNOSIS — I10 Essential (primary) hypertension: Secondary | ICD-10-CM | POA: Insufficient documentation

## 2016-01-16 DIAGNOSIS — E785 Hyperlipidemia, unspecified: Secondary | ICD-10-CM | POA: Diagnosis not present

## 2016-01-16 DIAGNOSIS — M545 Low back pain: Secondary | ICD-10-CM | POA: Diagnosis not present

## 2016-01-16 DIAGNOSIS — M5416 Radiculopathy, lumbar region: Secondary | ICD-10-CM | POA: Diagnosis not present

## 2016-01-16 DIAGNOSIS — W1839XA Other fall on same level, initial encounter: Secondary | ICD-10-CM | POA: Insufficient documentation

## 2016-01-16 DIAGNOSIS — S59912A Unspecified injury of left forearm, initial encounter: Secondary | ICD-10-CM | POA: Diagnosis present

## 2016-01-16 DIAGNOSIS — Z794 Long term (current) use of insulin: Secondary | ICD-10-CM | POA: Diagnosis not present

## 2016-01-16 DIAGNOSIS — Y999 Unspecified external cause status: Secondary | ICD-10-CM | POA: Diagnosis not present

## 2016-01-16 DIAGNOSIS — S3992XA Unspecified injury of lower back, initial encounter: Secondary | ICD-10-CM | POA: Diagnosis not present

## 2016-01-16 DIAGNOSIS — W19XXXA Unspecified fall, initial encounter: Secondary | ICD-10-CM

## 2016-01-16 MED ORDER — HYDROCODONE-ACETAMINOPHEN 5-325 MG PO TABS: 1.0000 | ORAL_TABLET | Freq: Four times a day (QID) | ORAL | Status: DC | PRN

## 2016-01-16 MED ORDER — CYCLOBENZAPRINE HCL 5 MG PO TABS: 5.0000 mg | ORAL_TABLET | Freq: Three times a day (TID) | ORAL | Status: DC | PRN

## 2016-01-16 MED ORDER — CYCLOBENZAPRINE HCL 10 MG PO TABS
10.0000 mg | ORAL_TABLET | Freq: Once | ORAL | Status: AC
  Administered 2016-01-16: 10 mg via ORAL
  Filled 2016-01-16: qty 1

## 2016-01-16 MED ORDER — IBUPROFEN 800 MG PO TABS
800.0000 mg | ORAL_TABLET | Freq: Once | ORAL | Status: AC
  Administered 2016-01-16: 800 mg via ORAL
  Filled 2016-01-16: qty 1
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
## 2016-01-16 NOTE — ED Notes (Signed)
Pt. Going home with husband. 

## 2016-01-16 NOTE — Discharge Instructions (Signed)
Contusion A contusion is a deep bruise. Contusions are the result of a blunt injury to tissues and muscle fibers under the skin. The injury causes bleeding under the skin. The skin overlying the contusion may turn blue, purple, or yellow. Minor injuries will give you a painless contusion, but more severe contusions may stay painful and swollen for a few weeks.  CAUSES  This condition is usually caused by a blow, trauma, or direct force to an area of the body. SYMPTOMS  Symptoms of this condition include:  Swelling of the injured area.  Pain and tenderness in the injured area.  Discoloration. The area may have redness and then turn blue, purple, or yellow. DIAGNOSIS  This condition is diagnosed based on a physical exam and medical history. An X-ray, CT scan, or MRI may be needed to determine if there are any associated injuries, such as broken bones (fractures). TREATMENT  Specific treatment for this condition depends on what area of the body was injured. In general, the best treatment for a contusion is resting, icing, applying pressure to (compression), and elevating the injured area. This is often called the RICE strategy. Over-the-counter anti-inflammatory medicines may also be recommended for pain control.  HOME CARE INSTRUCTIONS   Rest the injured area.  If directed, apply ice to the injured area:  Put ice in a plastic bag.  Place a towel between your skin and the bag.  Leave the ice on for 20 minutes, 2-3 times per day.  If directed, apply light compression to the injured area using an elastic bandage. Make sure the bandage is not wrapped too tightly. Remove and reapply the bandage as directed by your health care provider.  If possible, raise (elevate) the injured area above the level of your heart while you are sitting or lying down.  Take over-the-counter and prescription medicines only as told by your health care provider. SEEK MEDICAL CARE IF:  Your symptoms do not  improve after several days of treatment.  Your symptoms get worse.  You have difficulty moving the injured area. SEEK IMMEDIATE MEDICAL CARE IF:   You have severe pain.  You have numbness in a hand or foot.  Your hand or foot turns pale or cold.   This information is not intended to replace advice given to you by your health care provider. Make sure you discuss any questions you have with your health care provider.   Document Released: 05/18/2005 Document Revised: 04/29/2015 Document Reviewed: 12/24/2014 Elsevier Interactive Patient Education 2016 [place].  Cryotherapy Cryotherapy means treatment with cold. Ice or gel packs can be used to reduce both pain and swelling. Ice is the most helpful within the first 24 to 48 hours after an injury or flare-up from overusing a muscle or joint. Sprains, strains, spasms, burning pain, shooting pain, and aches can all be eased with ice. Ice can also be used when recovering from surgery. Ice is effective, has very few side effects, and is safe for most people to use. PRECAUTIONS  Ice is not a safe treatment option for people with:  Raynaud phenomenon. This is a condition affecting small blood vessels in the extremities. Exposure to cold may cause your problems to return.  Cold hypersensitivity. There are many forms of cold hypersensitivity, including:  Cold urticaria. Red, itchy hives appear on the skin when the tissues begin to warm after being iced.  Cold erythema. This is a red, itchy rash caused by exposure to cold.  Cold hemoglobinuria. Red blood cells  break down when the tissues begin to warm after being iced. The hemoglobin that carry oxygen are passed into the urine because they cannot combine with blood proteins fast enough. °· Numbness or altered sensitivity in the area being iced. °If you have any of the following conditions, do not use ice until you have discussed cryotherapy with your caregiver: °· Heart conditions, such as  arrhythmia, angina, or chronic heart disease. °· High blood pressure. °· Healing wounds or open skin in the area being iced. °· Current infections. °· Rheumatoid arthritis. °· Poor circulation. °· Diabetes. °Ice slows the blood flow in the region it is applied. This is beneficial when trying to stop inflamed tissues from spreading irritating chemicals to surrounding tissues. However, if you expose your skin to cold temperatures for too long or without the proper protection, you can damage your skin or nerves. Watch for signs of skin damage due to cold. °HOME CARE INSTRUCTIONS °Follow these tips to use ice and cold packs safely. °· Place a dry or damp towel between the ice and skin. A damp towel will cool the skin more quickly, so you may need to shorten the time that the ice is used. °· For a more rapid response, add gentle compression to the ice. °· Ice for no more than 10 to 20 minutes at a time. The bonier the area you are icing, the less time it will take to get the benefits of ice. °· Check your skin after 5 minutes to make sure there are no signs of a poor response to cold or skin damage. °· Rest 20 minutes or more between uses. °· Once your skin is numb, you can end your treatment. You can test numbness by very lightly touching your skin. The touch should be so light that you do not see the skin dimple from the pressure of your fingertip. When using ice, most people will feel these normal sensations in this order: cold, burning, aching, and numbness. °· Do not use ice on someone who cannot communicate their responses to pain, such as small children or people with dementia. °HOW TO MAKE AN ICE PACK °Ice packs are the most common way to use ice therapy. Other methods include ice massage, ice baths, and cryosprays. Muscle creams that cause a cold, tingly feeling do not offer the same benefits that ice offers and should not be used as a substitute unless recommended by your caregiver. °To make an ice pack, do one  of the following: °· Place crushed ice or a bag of frozen vegetables in a sealable plastic bag. Squeeze out the excess air. Place this bag inside another plastic bag. Slide the bag into a pillowcase or place a damp towel between your skin and the bag. °· Mix 3 parts water with 1 part rubbing alcohol. Freeze the mixture in a sealable plastic bag. When you remove the mixture from the freezer, it will be slushy. Squeeze out the excess air. Place this bag inside another plastic bag. Slide the bag into a pillowcase or place a damp towel between your skin and the bag. °SEEK MEDICAL CARE IF: °· You develop white spots on your skin. This may give the skin a blotchy (mottled) appearance. °· Your skin turns blue or pale. °· Your skin becomes waxy or hard. °· Your swelling gets worse. °MAKE SURE YOU:  °· Understand these instructions. °· Will watch your condition. °· Will get help right away if you are not doing well or get worse. °  °  This information is not intended to replace advice given to you by your health care provider. Make sure you discuss any questions you have with your health care provider.   Document Released: 04/04/2011 Document Revised: 08/29/2014 Document Reviewed: 04/04/2011 Elsevier Interactive Patient Education 2016 [employer]  Lumbosacral Radiculopathy Lumbosacral radiculopathy is a condition that involves the spinal nerves and nerve roots in the low back and bottom of the spine. The condition develops when these nerves and nerve roots move out of place or become inflamed and cause symptoms. CAUSES This condition may be caused by:  Pressure from a disk that bulges out of place (herniated disk). A disk is a plate of cartilage that separates bones in the spine.  Disk degeneration.  A narrowing of the bones of the lower back (spinal stenosis).  A tumor.  An infection.  An injury that places sudden pressure on the disks that cushion the bones of your lower spine. RISK FACTORS This  condition is more likely to develop in:  Males aged 30-50 years.  Females aged 50-60 years.  People who lift improperly.  People who are overweight or live a sedentary lifestyle.  People who smoke.  People who perform repetitive activities that strain the spine. SYMPTOMS Symptoms of this condition include:  Pain that goes down from the back into the legs (sciatica). This is the most common symptom. The pain may be worse with sitting, coughing, or sneezing.  Pain and numbness in the arms and legs.  Muscle weakness.  Tingling.  Loss of bladder control or bowel control. DIAGNOSIS This condition is diagnosed with a physical exam and medical history. If the pain is lasting, you may have tests, such as:  MRI scan.  X-ray.  CT scan.  Myelogram.  Nerve conduction study. TREATMENT This condition is often treated with:  Hot packs and ice applied to affected areas.  Stretches to improve flexibility.  Exercises to strengthen back muscles.  Physical therapy.  Pain medicine.  A steroid injection in the spine. In some cases, no treatment is needed. If the condition is long-lasting (chronic), or if symptoms are severe, treatment may involve surgery or lifestyle changes, such as following a weight loss plan. HOME CARE INSTRUCTIONS Medicines  Take medicines only as directed by your health care provider.  Do not drive or operate heavy machinery while taking pain medicine. Injury Care  Apply a heat pack to the injured area as directed by your health care provider.  Apply ice to the affected area:  Put ice in a plastic bag.  Place a towel between your skin and the bag.  Leave the ice on for 20-30 minutes, every 2 hours while you are awake or as needed. Or, leave the ice on for as long as directed by your health care provider. Other Instructions  If you were shown how to do any exercises or stretches, do them as directed by your health care provider.  If your health  care provider prescribed a diet or exercise program, follow it as directed.  Keep all follow-up visits as directed by your health care provider. This is important. SEEK MEDICAL CARE IF:  Your pain does not improve over time even when taking pain medicines. SEEK IMMEDIATE MEDICAL CARE IF:  Your develop severe pain.  Your pain suddenly gets worse.  You develop increasing weakness in your legs.  You lose the ability to control your bladder or bowel.  You have difficulty walking or balancing.  You have a fever.  This information is not intended to replace advice given to you by your health care provider. Make sure you discuss any questions you have with your health care provider.   Document Released: 08/08/2005 Document Revised: 12/23/2014 Document Reviewed: 08/04/2014 Elsevier Interactive Patient Education [employer]

## 2016-01-16 NOTE — ED Notes (Signed)
Pt has sling from [facility] - states able to move arm, her elbow hurts and her back.

## 2016-01-16 NOTE — ED Provider Notes (Signed)
CSN: [id number]     Arrival date & time 01/16/16  1834 History   First MD Initiated Contact with Patient 01/16/16 1936     Chief Complaint  Patient presents with  . Arm Pain  . Back Pain     (Consider location/radiation/quality/duration/timing/severity/associated sxs/prior Treatment) HPI  69 year old female presents to emergency department for evaluation of left forearm pain, left lower back pain. Patient states she was at home, someone fell backwards landing on her causing the patient to fall backwards on her left forearm and lower back. Patient fell backwards onto asphalt denies any other injury to her body. No loss of consciousness, dizziness. She complains of moderate pain 5 out of 10 to the left proximal forearm and moderate to severe pain along the left lower back radiating down her left posterior thigh and calf. She describes radiation of the pain as a numbness intake. She has chronic back pain with lumbar radicular symptoms. Patient states these are her same normal radicular symptoms but more intensified since the fall. Patient has a pain stimulator inserted into her lumbar spine. She said to lumbar back surgeries. She does not have any medications for pain. Denies any weakness or loss of bowel or bladder symptoms. Denies any cervical thoracic back pain. No chest pain or shortness of breath.   Past Medical History  Diagnosis Date  . Arthritis   . Hyperlipidemia     takes Lipitor daily  . Essential hypertension, benign     takes Lisinopril-HCTZ daily  . Unspecified essential hypertension   . Hypertension   . Vitamin D deficiency     takes Vit D weekly  . Seasonal allergies     takes Singulair daily as needed  . History of migraine     last one 2-3 months ago  . Weakness     numbness and tingling left arm  . Joint pain   . Low back pain   . History of colon polyps     benign  . Nocturia   . Type II or unspecified type diabetes mellitus without mention of complication, not  stated as uncontrolled     Levemir nightly;average fasting blood sugar 125-140  . Diabetes mellitus without complication ([place])   . Cataract     right eye but immature  . History of shingles   . GERD (gastroesophageal reflux disease)    Past Surgical History  Procedure Laterality Date  . Back surgery    . Total knee arthroplasty Left   . Small bowel repair    . Kidney surgery  1998    growth removed from left kidney   . Abdominal hysterectomy    . Breast surgery  1990    Augementation  . Back surgery      Lumbar fusion x 2  . Revision of scar tissue rectus muscle    . Pain stimulator    . Spinal cord stimulator battery exchange N/A 10/17/2012    Procedure: SPINAL CORD STIMULATOR BATTERY REMOVAL;  Surgeon: Melina Schools, MD;  Location: [facility];  Service: Orthopedics;  Laterality: N/A;  . Anterior lat lumbar fusion N/A 03/21/2013    Procedure: ANTERIOR LATERAL LUMBAR FUSION 1 LEVEL/ XLIF L3-L4 ;  Surgeon: Melina Schools, MD;  Location: [place];  Service: Orthopedics;  Laterality: N/A;  . Posterior cervical fusion/foraminotomy Right 03/21/2013    Procedure: POSTERIOR L2-3 RIGHT FORAMINOTOMY;  Surgeon: Melina Schools, MD;  Location: [place];  Service: Orthopedics;  Laterality: Right;  . Cardiac catheterization  5/12  ef 55%  . Cardiac catheterization  10/2010    [facility]; EF 55%  . Appendectomy    . Colonoscopy    . Esophagogastroduodenoscopy    . Anterior cervical decomp/discectomy fusion N/A 02/26/2015    Procedure: ANTERIOR CERVICAL DISCECTOMY FUSION C4-5 (1 LEVEL);  Surgeon: Venita Lick, MD;  Location: [facility] OR;  Service: Orthopedics;  Laterality: N/A;  . Spinal cord stimulator battery exchange N/A 07/22/2015    Procedure: REIMPLANTATION OF SPINAL CORD STIMULATOR BATTERY ;  Surgeon: Venita Lick, MD;  Location: [facility] OR;  Service: Orthopedics;  Laterality: N/A;   Family History  Problem Relation Age of Onset  . Heart attack Mother    Social History  Substance Use Topics  . Smoking status:  Former Smoker    Types: Cigarettes  . Smokeless tobacco: Never Used     Comment: quit smoking 78yrs ago  . Alcohol Use: No   OB History    Gravida Para Term Preterm AB TAB SAB Ectopic Multiple Living   0 0 0 0 0 0 0 0       Review of Systems  Constitutional: Negative for fever, chills, activity change and fatigue.  HENT: Negative for congestion, sinus pressure and sore throat.   Eyes: Negative for visual disturbance.  Respiratory: Negative for cough, chest tightness and shortness of breath.   Cardiovascular: Negative for chest pain and leg swelling.  Gastrointestinal: Negative for nausea, vomiting, abdominal pain and diarrhea.  Genitourinary: Negative for dysuria.  Musculoskeletal: Positive for back pain and arthralgias. Negative for gait problem.  Skin: Negative for rash.  Neurological: Negative for weakness, numbness and headaches.  Hematological: Negative for adenopathy.  Psychiatric/Behavioral: Negative for behavioral problems, confusion and agitation.      Allergies  Adhesive  Home Medications   Prior to Admission medications   Medication Sig Start Date End Date Taking? Authorizing Provider  ACCU-CHEK FASTCLIX LANCETS MISC  08/04/15   Historical Provider, MD  albuterol (PROVENTIL HFA;VENTOLIN HFA) 108 (90 Base) MCG/ACT inhaler Inhale 2 puffs into the lungs every 6 (six) hours as needed (for wheezing and shortness of breath from seasonal allergies). 09/30/15   Dennison Mascot, MD  albuterol (PROVENTIL) (2.5 MG/3ML) 0.083% nebulizer solution  11/28/15   Historical Provider, MD  Alcohol Swabs (B-D SINGLE USE SWABS REGULAR) PADS  08/04/15   Historical Provider, MD  amoxicillin-clavulanate (AUGMENTIN) 875-125 MG tablet Take 1 tablet by mouth 2 (two) times daily. 12/30/15   Margaretann Loveless, PA-C  atorvastatin (LIPITOR) 40 MG tablet TAKE 1 TABLET EVERY DAY 10/21/15   Dennison Mascot, MD  Blood Glucose Calibration (ACCU-CHEK SMARTVIEW CONTROL) LIQD  08/04/15   Historical Provider,  MD  Blood Glucose Monitoring Suppl (ACCU-CHEK NANO SMARTVIEW) w/Device KIT CHECK BLOOD SUGAR TWICE DAILY 12/31/15   Dennison Mascot, MD  chlorpheniramine-HYDROcodone (TUSSIONEX PENNKINETIC ER) 10-8 MG/5ML SUER Take 5 mLs by mouth every 12 (twelve) hours as needed. 12/30/15   Margaretann Loveless, PA-C  citalopram (CELEXA) 10 MG tablet TAKE 1 TABLET EVERY DAY  FOR  1  WEEK  THEN TAKE 2 TABLETS EVERY DAY 12/31/15   Dennison Mascot, MD  cyclobenzaprine (FLEXERIL) 5 MG tablet Take 1 tablet (5 mg total) by mouth every 8 (eight) hours as needed for muscle spasms. 01/16/16   Evon Slack, PA-C  fluticasone (FLONASE) 50 MCG/ACT nasal spray USE 1 SPRAY IN EACH NOSTRIL TWICE DAILY 10/21/15   Dennison Mascot, MD  Fluticasone-Salmeterol (ADVAIR DISKUS) 250-50 MCG/DOSE AEPB Inhale 1 puff into the lungs 2 (two) times daily.  12/30/15   Mar Daring, PA-C  glucose blood (ACCU-CHEK SMARTVIEW) test strip TEST BLOOD SUGARS 4 TIMES DAILY 08/07/15   Ashok Norris, MD  HYDROcodone-acetaminophen (NORCO) 5-325 MG tablet Take 1 tablet by mouth every 6 (six) hours as needed for moderate pain. 01/16/16   Duanne Guess, PA-C  ibuprofen (ADVIL,MOTRIN) 800 MG tablet Take 1 tablet (800 mg total) by mouth every 8 (eight) hours as needed. 08/04/15   Ashok Norris, MD  Insulin Pen Needle (BD ULTRA-FINE PEN NEEDLES) 29G X 12.7MM MISC 1 pen by Does not apply route once. 03/11/15   Ashok Norris, MD  ipratropium-albuterol (DUONEB) 0.5-2.5 (3) MG/3ML SOLN Take 3 mLs by nebulization every 4 (four) hours as needed. 12/01/15   Jennifer M Burnette, PA-C  LEVEMIR FLEXTOUCH 100 UNIT/ML Pen INJECT  5 UNITS SUBCUTANEOUSLY AT BEDTIME (DISCARD PEN 42 DAYS AFTER OPENING) 10/21/15   Bobetta Lime, MD  lisinopril-hydrochlorothiazide (PRINZIDE,ZESTORETIC) 20-25 MG tablet TAKE 1 TABLET EVERY DAY 10/21/15   Ashok Norris, MD  meloxicam (MOBIC) 15 MG tablet Take 1 tablet (15 mg total) by mouth daily. 09/01/15   Ashok Norris, MD  methocarbamol  (ROBAXIN) 500 MG tablet Take 1 tablet (500 mg total) by mouth 3 (three) times daily as needed for muscle spasms. 02/26/15   Melina Schools, MD  montelukast (SINGULAIR) 10 MG tablet TAKE 1 TABLET EVERY DAY 10/21/15   Ashok Norris, MD  ondansetron (ZOFRAN) 4 MG tablet TAKE 1 TABLET EVERY 8 HOURS AS NEEDED FOR NAUSEA OR VOMITING 12/18/15   Ashok Norris, MD  oxyCODONE-acetaminophen (PERCOCET) 10-325 MG tablet Take 1 tablet by mouth every 4 (four) hours as needed for pain.    Historical Provider, MD  Vitamin D, Ergocalciferol, (DRISDOL) 50000 units CAPS capsule TAKE ONE CAPSULE BY MOUTH EVERY 7 DAYS 10/07/15   Ashok Norris, MD   BP 146/72 mmHg  Pulse 85  Temp(Src) 98.6 F (37 C)  Resp 18  Ht 5' (1.524 m)  Wt 82.555 kg  BMI 35.54 kg/m2  SpO2 97% Physical Exam  Constitutional: She is oriented to person, place, and time. She appears well-developed and well-nourished. No distress.  HENT:  Head: Normocephalic and atraumatic.  Mouth/Throat: Oropharynx is clear and moist.  Eyes: EOM are normal. Pupils are equal, round, and reactive to light. Right eye exhibits no discharge. Left eye exhibits no discharge.  Neck: Normal range of motion. Neck supple.  Cardiovascular: Normal rate, regular rhythm and intact distal pulses.   Pulmonary/Chest: Effort normal and breath sounds normal. No respiratory distress. She exhibits no tenderness.  Abdominal: Soft. She exhibits no distension. There is no tenderness.  Musculoskeletal:  Lumbar Spine: Examination of the lumbar spine reveals no bony abnormality, no edema, and no ecchymosis.  There is no step off.  The patient has painful range of motion of the lumbar spine with flexion and extension.  The patient has normal lateral bend and rotation.  The patient has a negative axial load test, and a negative rotational Waddell test.  The patient is non tender along the spinous process.  The patient is tender along the left paravertebral muscles of the lumbar sacral  junction. The patient is non tender along the iliac crest.  The patient is non tender in the sciatic notch.  The patient is non tender along the Sacroiliac joint.  There is no Coccyx joint tenderness.    Bilateral Lower Extremities: Examination of the lower extremities reveals no bony abnormality, no edema, and no ecchymosis.  The patient has full active and  passive range of motion of the hips, knees, and ankles.  There is no discomfort with range of motion exercises.  The patient is non tender along the greater trochanter region.  The patient has a negative Bevelyn Buckles' test bilaterally.  There is normal skin warmth.  There is normal capillary refill bilaterally.    Neurologic: The patient has a positive left straight leg raise.  The patient has normal muscle strength testing for the quadriceps, calves, ankle dorsiflexion, ankle plantarflexion, and extensor hallicus longus.  The patient has sensation that is intact to light touch.  The deep tendon reflexes are normal.   Examination of the cervical spine shows patient has no spinous process tenderness. She is nontender throughout the left shoulder. She has full range of motion of left shoulder and elbow. She has mild discomfort with left forearm pronation to supination and tenderness along the proximal ulna shaft. Nontender throughout the wrist or digits. She is nervous intact left upper extremity.  Neurological: She is alert and oriented to person, place, and time. She has normal reflexes. No cranial nerve deficit. Coordination normal.  Skin: Skin is warm and dry.  Psychiatric: She has a normal mood and affect. Her behavior is normal. Thought content normal.    ED Course  Procedures (including critical care time) Labs Review Labs Reviewed - No data to display  Imaging Review Dg Lumbar Spine Complete  01/16/2016  CLINICAL DATA:  Someone fell on patient, with lower back pain. Initial encounter. EXAM: LUMBAR SPINE - COMPLETE 4+ VIEW COMPARISON:  Lumbar  spine radiographs performed 03/22/2013 FINDINGS: There is no evidence of fracture or subluxation. The patient is status post lumbar spinal fusion at L4-S1, and interbody fusion at L3-L4. Underlying decompression is noted at the lower lumbar spine. Vertebral bodies demonstrate normal height and alignment. Mild sclerotic change is noted at L2-L3. Thoracic spinal stimulation leads are noted, with associated metallic device at the right flank. Postoperative change is noted at the left upper quadrant. The visualized bowel gas pattern is unremarkable in appearance; air and stool are noted within the colon. The sacroiliac joints are within normal limits. IMPRESSION: 1. No evidence of fracture or subluxation along the lumbar spine. 2. Status post lumbar spinal fusion at L4-S1, and interbody fusion at L3-L4. Electronically Signed   By: Garald Balding M.D.   On: 01/16/2016 21:07   Dg Forearm Left  01/16/2016  CLINICAL DATA:  Someone fell on patient, with left forearm pain and swelling. Initial encounter. EXAM: LEFT FOREARM - 2 VIEW COMPARISON:  Left wrist radiographs performed 02/10/2011 FINDINGS: There is no evidence of fracture or dislocation. Mild negative ulnar variance is noted. The elbow joint is unremarkable in appearance. No elbow joint effusion is seen. The carpal rows are only partially characterized, but appear grossly unremarkable. No definite soft tissue abnormalities are characterized on radiograph. IMPRESSION: No evidence of fracture or dislocation. Electronically Signed   By: Garald Balding M.D.   On: 01/16/2016 21:07   I have personally reviewed and evaluated these images and lab results as part of my medical decision-making.   EKG Interpretation None      MDM   Final diagnoses:  Fall, initial encounter  Forearm contusion, left, initial encounter  Left lumbar radiculopathy    70 year old female with fall. No head trauma. She complains of pain only to the left forearm and acute on chronic  left lumbar radiculopathy. No weakness or neurological deficits on exam. X-rays of the left forearm and lumbar spine are negative. She  is given a prescription for Norco 5-3 25, one tablet by mouth 3 times a day when necessary severe pain quantity #10 with 0 refills. She will also start Flexeril 5 mg 3 times a day when necessary muscle spasms. She will continue with ibuprofen. She is given a sling to help with forearm pain over the next few days, she will discontinue sling 3-4 days. Follow up with orthopedics or spine specialist in 5-7 days if no improvement. She is Educated on signs and symptoms to return to the emergency department for.    Duanne Guess, PA-C 01/16/16 2136  Orbie Pyo, MD 01/17/16 [phone]

## 2016-01-18 ENCOUNTER — Other Ambulatory Visit: Payer: Self-pay | Admitting: Family Medicine

## 2016-01-20 DIAGNOSIS — M50122 Cervical disc disorder at C5-C6 level with radiculopathy: Secondary | ICD-10-CM | POA: Diagnosis not present

## 2016-01-21 DIAGNOSIS — M5136 Other intervertebral disc degeneration, lumbar region: Secondary | ICD-10-CM | POA: Diagnosis not present

## 2016-01-21 DIAGNOSIS — M961 Postlaminectomy syndrome, not elsewhere classified: Secondary | ICD-10-CM | POA: Diagnosis not present

## 2016-01-21 DIAGNOSIS — Z79891 Long term (current) use of opiate analgesic: Secondary | ICD-10-CM | POA: Diagnosis not present

## 2016-01-21 DIAGNOSIS — G894 Chronic pain syndrome: Secondary | ICD-10-CM | POA: Diagnosis not present

## 2016-02-01 ENCOUNTER — Other Ambulatory Visit: Payer: Self-pay | Admitting: Emergency Medicine

## 2016-02-01 MED ORDER — ONDANSETRON HCL 4 MG PO TABS: ORAL_TABLET | ORAL | Status: DC

## 2016-02-02 DIAGNOSIS — I208 Other forms of angina pectoris: Secondary | ICD-10-CM | POA: Diagnosis not present

## 2016-02-02 DIAGNOSIS — R0602 Shortness of breath: Secondary | ICD-10-CM | POA: Diagnosis not present

## 2016-02-03 DIAGNOSIS — M5136 Other intervertebral disc degeneration, lumbar region: Secondary | ICD-10-CM | POA: Diagnosis not present

## 2016-02-03 DIAGNOSIS — M50122 Cervical disc disorder at C5-C6 level with radiculopathy: Secondary | ICD-10-CM | POA: Diagnosis not present

## 2016-02-09 DIAGNOSIS — I10 Essential (primary) hypertension: Secondary | ICD-10-CM | POA: Diagnosis not present

## 2016-02-09 DIAGNOSIS — R9439 Abnormal result of other cardiovascular function study: Secondary | ICD-10-CM | POA: Diagnosis not present

## 2016-02-09 DIAGNOSIS — E785 Hyperlipidemia, unspecified: Secondary | ICD-10-CM | POA: Diagnosis not present

## 2016-02-09 DIAGNOSIS — Z87898 Personal history of other specified conditions: Secondary | ICD-10-CM | POA: Diagnosis not present

## 2016-02-09 DIAGNOSIS — K219 Gastro-esophageal reflux disease without esophagitis: Secondary | ICD-10-CM | POA: Diagnosis not present

## 2016-02-09 DIAGNOSIS — I208 Other forms of angina pectoris: Secondary | ICD-10-CM | POA: Diagnosis not present

## 2016-02-09 DIAGNOSIS — E119 Type 2 diabetes mellitus without complications: Secondary | ICD-10-CM | POA: Diagnosis not present

## 2016-02-09 DIAGNOSIS — R079 Chest pain, unspecified: Secondary | ICD-10-CM | POA: Diagnosis not present

## 2016-02-09 DIAGNOSIS — R0602 Shortness of breath: Secondary | ICD-10-CM | POA: Diagnosis not present

## 2016-02-16 ENCOUNTER — Ambulatory Visit
Admission: RE | Admit: 2016-02-16 | Discharge: 2016-02-16 | Disposition: A | Discharge status: Home / Self Care | Payer: Commercial Managed Care - HMO | Source: Ambulatory Visit | Attending: Internal Medicine | Admitting: Internal Medicine

## 2016-02-16 ENCOUNTER — Encounter: Payer: Self-pay | Admitting: *Deleted

## 2016-02-16 ENCOUNTER — Encounter
Admission: RE | Disposition: A | Discharge status: Home / Self Care | Payer: Self-pay | Source: Ambulatory Visit | Attending: Internal Medicine

## 2016-02-16 DIAGNOSIS — R079 Chest pain, unspecified: Secondary | ICD-10-CM | POA: Insufficient documentation

## 2016-02-16 DIAGNOSIS — Z79891 Long term (current) use of opiate analgesic: Secondary | ICD-10-CM | POA: Insufficient documentation

## 2016-02-16 DIAGNOSIS — Z90710 Acquired absence of both cervix and uterus: Secondary | ICD-10-CM | POA: Insufficient documentation

## 2016-02-16 DIAGNOSIS — Z79899 Other long term (current) drug therapy: Secondary | ICD-10-CM | POA: Insufficient documentation

## 2016-02-16 DIAGNOSIS — K219 Gastro-esophageal reflux disease without esophagitis: Secondary | ICD-10-CM | POA: Diagnosis not present

## 2016-02-16 DIAGNOSIS — R9431 Abnormal electrocardiogram [ECG] [EKG]: Secondary | ICD-10-CM | POA: Diagnosis not present

## 2016-02-16 DIAGNOSIS — R9439 Abnormal result of other cardiovascular function study: Secondary | ICD-10-CM | POA: Insufficient documentation

## 2016-02-16 DIAGNOSIS — Z91048 Other nonmedicinal substance allergy status: Secondary | ICD-10-CM | POA: Insufficient documentation

## 2016-02-16 DIAGNOSIS — I209 Angina pectoris, unspecified: Secondary | ICD-10-CM | POA: Diagnosis not present

## 2016-02-16 DIAGNOSIS — Z9889 Other specified postprocedural states: Secondary | ICD-10-CM | POA: Insufficient documentation

## 2016-02-16 DIAGNOSIS — Z87891 Personal history of nicotine dependence: Secondary | ICD-10-CM | POA: Diagnosis not present

## 2016-02-16 DIAGNOSIS — Z794 Long term (current) use of insulin: Secondary | ICD-10-CM | POA: Diagnosis not present

## 2016-02-16 DIAGNOSIS — I10 Essential (primary) hypertension: Secondary | ICD-10-CM | POA: Insufficient documentation

## 2016-02-16 DIAGNOSIS — E119 Type 2 diabetes mellitus without complications: Secondary | ICD-10-CM | POA: Diagnosis not present

## 2016-02-16 DIAGNOSIS — E785 Hyperlipidemia, unspecified: Secondary | ICD-10-CM | POA: Diagnosis not present

## 2016-02-16 HISTORY — PX: CARDIAC CATHETERIZATION: SHX172

## 2016-02-16 LAB — GLUCOSE, CAPILLARY: Glucose-Capillary: 179 mg/dL — ABNORMAL HIGH (ref 65–99)

## 2016-02-16 SURGERY — LEFT HEART CATH AND CORONARY ANGIOGRAPHY
Anesthesia: Moderate Sedation | Laterality: Left

## 2016-02-16 MED ORDER — FENTANYL CITRATE (PF) 100 MCG/2ML IJ SOLN
INTRAMUSCULAR | Status: AC
  Filled 2016-02-16: qty 2

## 2016-02-16 MED ORDER — MIDAZOLAM HCL 2 MG/2ML IJ SOLN
INTRAMUSCULAR | Status: AC
  Filled 2016-02-16: qty 2

## 2016-02-16 MED ORDER — SODIUM CHLORIDE 0.9 % IV SOLN
INTRAVENOUS | Status: DC
  Administered 2016-02-16: 12:00:00 via INTRAVENOUS

## 2016-02-16 MED ORDER — HEPARIN (PORCINE) IN NACL 2-0.9 UNIT/ML-% IJ SOLN
INTRAMUSCULAR | Status: AC
  Filled 2016-02-16: qty 500

## 2016-02-16 MED ORDER — MIDAZOLAM HCL 2 MG/2ML IJ SOLN
INTRAMUSCULAR | Status: DC | PRN
  Administered 2016-02-16 (×2): 1 mg via INTRAVENOUS

## 2016-02-16 MED ORDER — FENTANYL CITRATE (PF) 100 MCG/2ML IJ SOLN
INTRAMUSCULAR | Status: DC | PRN
  Administered 2016-02-16 (×2): 25 ug via INTRAVENOUS

## 2016-02-16 MED ORDER — IOPAMIDOL (ISOVUE-300) INJECTION 61%
INTRAVENOUS | Status: DC | PRN
  Administered 2016-02-16: 85 mL via INTRA_ARTERIAL

## 2016-02-16 SURGICAL SUPPLY — 10 items
CATH INFINITI 5FR ANG PIGTAIL (CATHETERS) ×2 IMPLANT
CATH INFINITI 5FR JL4 (CATHETERS) ×2 IMPLANT
CATH INFINITI JR4 5F (CATHETERS) ×2 IMPLANT
DEVICE CLOSURE MYNXGRIP 5F (Vascular Products) ×2 IMPLANT
KIT MANI 3VAL PERCEP (MISCELLANEOUS) ×3 IMPLANT
NDL PERC 18GX7CM (NEEDLE) IMPLANT
NEEDLE PERC 18GX7CM (NEEDLE) ×3 IMPLANT
PACK CARDIAC CATH (CUSTOM PROCEDURE TRAY) ×3 IMPLANT
SHEATH AVANTI 5FR X 11CM (SHEATH) ×2 IMPLANT
WIRE EMERALD 3MM-J .035X150CM (WIRE) ×2 IMPLANT

## 2016-02-16 NOTE — Discharge Instructions (Signed)
Groin Insertion Instructions-If you lose feeling or develop tingling or pain in your leg or foot after the procedure, please walk around first.  If the discomfort does not improve , contact your physician and proceed to the nearest emergency room.  Loss of feeling in your leg might mean that a blockage has formed in the artery and this can be appropriately treated.  Limit your activity for the next two days after your procedure.  Avoid stooping, bending, heavy lifting or exertion as this may put pressure on the insertion site.  Resume normal activities in 48 hours.  You may shower after 24 hours but avoid excessive warm water and do not scrub the site.  Remove clear dressing in 48 hours.  If you have had a closure device inserted, do not soak in a tub bath or a hot tub for at least one week. ° °No driving for 48 hours after discharge.  After the procedure, check the insertion site occasionally.  If any oozing occurs or there is apparent swelling, firm pressure over the site will prevent a bruise from forming.  You can not hurt anything by pressing directly on the site.  The pressure stops the bleeding by allowing a small clot to form.  If the bleeding continues after the pressure has been applied for more than 15 minutes, call 911 or go to the nearest emergency room.   ° °The x-ray dye causes you to pass a considerate amount of urine.  For this reason, you will be asked to drink plenty of liquids after the procedure to prevent dehydration.  You may resume you regular diet.  Avoid caffeine products.   ° °For pain at the site of your procedure, take non-aspirin medicines such as Tylenol. ° °Medications: A. Hold Metformin for 48 hours if applicable.  B. Continue taking all your present medications at home unless your doctor prescribes any changes.Groin Insertion Instructions-If you lose feeling or develop tingling or pain in your leg or foot after the procedure, please walk around first.  If the discomfort does not  improve , contact your physician and proceed to the nearest emergency room.  Loss of feeling in your leg might mean that a blockage has formed in the artery and this can be appropriately treated.  Limit your activity for the next two days after your procedure.  Avoid stooping, bending, heavy lifting or exertion as this may put pressure on the insertion site.  Resume normal activities in 48 hours.  You may shower after 24 hours but avoid excessive warm water and do not scrub the site.  Remove clear dressing in 48 hours.  If you have had a closure device inserted, do not soak in a tub bath or a hot tub for at least one week. ° °No driving for 48 hours after discharge.  After the procedure, check the insertion site occasionally.  If any oozing occurs or there is apparent swelling, firm pressure over the site will prevent a bruise from forming.  You can not hurt anything by pressing directly on the site.  The pressure stops the bleeding by allowing a small clot to form.  If the bleeding continues after the pressure has been applied for more than 15 minutes, call 911 or go to the nearest emergency room.   ° °The x-ray dye causes you to pass a considerate amount of urine.  For this reason, you will be asked to drink plenty of liquids after the procedure to prevent dehydration.  You   may resume you regular diet.  Avoid caffeine products.   ° °For pain at the site of your procedure, take non-aspirin medicines such as Tylenol. ° °Medications: A. Hold Metformin for 48 hours if applicable.  B. Continue taking all your present medications at home unless your doctor prescribes any changes. °

## 2016-02-24 ENCOUNTER — Ambulatory Visit (INDEPENDENT_AMBULATORY_CARE_PROVIDER_SITE_OTHER): Payer: Commercial Managed Care - HMO | Admitting: Family Medicine

## 2016-02-24 ENCOUNTER — Encounter: Payer: Self-pay | Admitting: Family Medicine

## 2016-02-24 VITALS — BP 126/70 | HR 78 | Temp 97.6°F | Resp 20 | Ht 63.0 in | Wt 179.0 lb

## 2016-02-24 DIAGNOSIS — K5730 Diverticulosis of large intestine without perforation or abscess without bleeding: Secondary | ICD-10-CM | POA: Diagnosis not present

## 2016-02-24 DIAGNOSIS — J301 Allergic rhinitis due to pollen: Secondary | ICD-10-CM | POA: Diagnosis not present

## 2016-02-24 DIAGNOSIS — R1031 Right lower quadrant pain: Secondary | ICD-10-CM

## 2016-02-24 DIAGNOSIS — K5790 Diverticulosis of intestine, part unspecified, without perforation or abscess without bleeding: Secondary | ICD-10-CM | POA: Insufficient documentation

## 2016-02-24 LAB — POCT URINALYSIS DIPSTICK
Bilirubin, UA: NEGATIVE
Blood, UA: NEGATIVE
Glucose, UA: 1000
Ketones, UA: NEGATIVE
Leukocytes, UA: NEGATIVE
Nitrite, UA: NEGATIVE
Protein, UA: NEGATIVE
Spec Grav, UA: 1.01
Urobilinogen, UA: 0.2
pH, UA: 6.5

## 2016-02-24 MED ORDER — METRONIDAZOLE 500 MG PO TABS: 500.0000 mg | ORAL_TABLET | Freq: Two times a day (BID) | ORAL | Status: DC

## 2016-02-24 MED ORDER — CIPROFLOXACIN HCL 500 MG PO TABS: 500.0000 mg | ORAL_TABLET | Freq: Two times a day (BID) | ORAL | Status: DC

## 2016-02-24 NOTE — Progress Notes (Signed)
Subjective:     Patient ID: Lori Potts, female   DOB: [birth year], 70 y.o.   MRN: [id number]  HPI  Chief Complaint  Patient presents with  . Abdominal Pain    right side radiating to her back 2-3 days  . URI    runnynose, cough and sneezing 2-3 days  States she just started Flonase for her allergy symptoms yesterday-remains on Singulair. Reports she had normal BM today. Hx of hysterectomy and appendectomy. Prior abd. CT scan in 2012 noted diverticulosis. Last colonoscopy in 2009.   Review of Systems     Objective:   Physical Exam  Constitutional: She appears well-developed and well-nourished. No distress.  Abdominal: Soft. There is tenderness (right lower quadrant). There is no guarding.  Ears: T.M's intact without inflammation Throat: no tonsillar enlargement or exudate Neck: no cervical adenopathy Lungs: clear     Assessment:    1. Allergic rhinitis due to pollen  2. Right lower quadrant abdominal pain - POCT urinalysis dipstick - ciprofloxacin (CIPRO) 500 MG tablet; Take 1 tablet (500 mg total) by mouth 2 (two) times daily.  Dispense: 14 tablet; Refill: 0 - metroNIDAZOLE (FLAGYL) 500 MG tablet; Take 1 tablet (500 mg total) by mouth 2 (two) times daily.  Dispense: 14 tablet; Refill: 0  3. Diverticulosis of large intestine without hemorrhage    Plan:    Will cover for possible diverticulitis. Consider CT scan if not improving. Suggested clear liquids/soups for 24-48 hours. May add Claritin or similar for her allergy symptoms.

## 2016-02-24 NOTE — Patient Instructions (Signed)
Continue Flonase and add Claritin or similar. Put your bowel to rest with clear liquids/soups for 24-48 hours. Let me or [facility] know if you are not improving over the next 48 hours.

## 2016-03-02 ENCOUNTER — Telehealth: Payer: Self-pay | Admitting: Family Medicine

## 2016-03-02 NOTE — Telephone Encounter (Signed)
This note came to me closed; I have not seen this patient and she does not have an upcoming appt with me; please forward to appropriate provider; thank you

## 2016-03-02 NOTE — Telephone Encounter (Signed)
I can not place referral. I will forward to appropriate provider.

## 2016-03-02 NOTE — Telephone Encounter (Signed)
Lori Potts from [facility] requesting return call. She is needing a referral for DX Code: M96.1 Patient has appointment for 03/04/16 with Dr Debbe BalesBrook  [phone] EXT [phone]

## 2016-03-03 NOTE — Telephone Encounter (Signed)
[facility]

## 2016-03-09 ENCOUNTER — Other Ambulatory Visit: Payer: Self-pay | Admitting: Family Medicine

## 2016-03-29 ENCOUNTER — Encounter: Payer: Self-pay | Admitting: Family Medicine

## 2016-03-29 ENCOUNTER — Ambulatory Visit (INDEPENDENT_AMBULATORY_CARE_PROVIDER_SITE_OTHER): Payer: Commercial Managed Care - HMO | Admitting: Family Medicine

## 2016-03-29 VITALS — BP 132/72 | HR 86 | Temp 97.9°F | Resp 15 | Ht 63.0 in | Wt 178.5 lb

## 2016-03-29 DIAGNOSIS — R1011 Right upper quadrant pain: Secondary | ICD-10-CM | POA: Diagnosis not present

## 2016-03-29 DIAGNOSIS — Z794 Long term (current) use of insulin: Secondary | ICD-10-CM

## 2016-03-29 DIAGNOSIS — E7800 Pure hypercholesterolemia, unspecified: Secondary | ICD-10-CM | POA: Diagnosis not present

## 2016-03-29 DIAGNOSIS — E1165 Type 2 diabetes mellitus with hyperglycemia: Secondary | ICD-10-CM

## 2016-03-29 DIAGNOSIS — IMO0001 Reserved for inherently not codable concepts without codable children: Secondary | ICD-10-CM

## 2016-03-29 DIAGNOSIS — R109 Unspecified abdominal pain: Secondary | ICD-10-CM | POA: Insufficient documentation

## 2016-03-29 LAB — POCT GLYCOSYLATED HEMOGLOBIN (HGB A1C): Hemoglobin A1C: 11.7

## 2016-03-29 LAB — GLUCOSE, POCT (MANUAL RESULT ENTRY): POC Glucose: 217 mg/dl — AB (ref 70–99)

## 2016-03-29 MED ORDER — INSULIN DETEMIR 100 UNIT/ML FLEXPEN: 16.0000 [IU] | PEN_INJECTOR | Freq: Every day | SUBCUTANEOUS | 1 refills | Status: DC

## 2016-03-29 MED ORDER — SITAGLIPTIN PHOSPHATE 100 MG PO TABS: 100.0000 mg | ORAL_TABLET | Freq: Every day | ORAL | 0 refills | Status: DC

## 2016-03-29 MED ORDER — TIZANIDINE HCL 4 MG PO CAPS: 4.0000 mg | ORAL_CAPSULE | Freq: Three times a day (TID) | ORAL | 0 refills | Status: DC

## 2016-03-29 NOTE — Progress Notes (Signed)
Name: Lori Potts   MRN: [id number]    DOB: [birth year]   Date:03/29/2016       Progress Note  Subjective  Chief Complaint  Chief Complaint  Patient presents with  . Flank Pain    rt side   This patient is followed by Dr. Rutherford Nail, new to me Flank Pain  This is a new problem. The current episode started 1 to 4 weeks ago (2 weeks ago). Pain location: pain located on the right flank and right lateral lower quadrant. The symptoms are aggravated by standing and lying down (worse when wakes up in the morning, worse with walking.). Pertinent negatives include no chest pain, dysuria, fever or numbness.  Diabetes  She presents for her follow-up diabetic visit. She has type 2 diabetes mellitus. Her disease course has been worsening (Diabetes Mellitus worse after she was prescribed  2 weeks course of Prednisone.). Pertinent negatives for diabetes include no chest pain, no fatigue, no foot paresthesias, no polydipsia and no polyuria. Current diabetic treatment includes intensive insulin program. She is following a diabetic diet. Her breakfast blood glucose range is generally >200 mg/dl. An ACE inhibitor/angiotensin II receptor blocker is being taken. Eye exam is current.    Past Medical History:  Diagnosis Date  . Arthritis   . Cataract    right eye but immature  . Diabetes mellitus without complication ([place])   . Essential hypertension, benign    takes Lisinopril-HCTZ daily  . GERD (gastroesophageal reflux disease)   . History of colon polyps    benign  . History of migraine    last one 2-3 months ago  . History of shingles   . Hyperlipidemia    takes Lipitor daily  . Hypertension   . Joint pain   . Low back pain   . Nocturia   . Seasonal allergies    takes Singulair daily as needed  . Type II or unspecified type diabetes mellitus without mention of complication, not stated as uncontrolled    Levemir nightly;average fasting blood sugar 125-140  . Unspecified essential hypertension     . Vitamin D deficiency    takes Vit D weekly  . Weakness    numbness and tingling left arm    Past Surgical History:  Procedure Laterality Date  . ABDOMINAL HYSTERECTOMY    . ANTERIOR CERVICAL DECOMP/DISCECTOMY FUSION N/A 02/26/2015   Procedure: ANTERIOR CERVICAL DISCECTOMY FUSION C4-5 (1 LEVEL);  Surgeon: Melina Schools, MD;  Location: [place];  Service: Orthopedics;  Laterality: N/A;  . ANTERIOR LAT LUMBAR FUSION N/A 03/21/2013   Procedure: ANTERIOR LATERAL LUMBAR FUSION 1 LEVEL/ XLIF L3-L4 ;  Surgeon: Melina Schools, MD;  Location: [place];  Service: Orthopedics;  Laterality: N/A;  . APPENDECTOMY    . BACK SURGERY    . BACK SURGERY     Lumbar fusion x 2  . BREAST SURGERY  1990   Augementation  . CARDIAC CATHETERIZATION  5/12   ef 55%  . CARDIAC CATHETERIZATION  10/2010   [facility]; EF 55%  . CARDIAC CATHETERIZATION Left 02/16/2016   Procedure: Left Heart Cath and Coronary Angiography;  Surgeon: Yolonda Kida, MD;  Location: [place] [facility];  Service: Cardiovascular;  Laterality: Left;  . COLONOSCOPY    . ESOPHAGOGASTRODUODENOSCOPY    . [place]   growth removed from left kidney   . pain stimulator    . POSTERIOR CERVICAL FUSION/FORAMINOTOMY Right 03/21/2013   Procedure: POSTERIOR L2-3 RIGHT FORAMINOTOMY;  Surgeon: Melina Schools,  MD;  Location: [place];  Service: Orthopedics;  Laterality: Right;  . REVISION OF SCAR TISSUE RECTUS MUSCLE    . SMALL BOWEL REPAIR    . SPINAL CORD STIMULATOR BATTERY EXCHANGE N/A 10/17/2012   Procedure: SPINAL CORD STIMULATOR BATTERY REMOVAL;  Surgeon: Melina Schools, MD;  Location: [place];  Service: Orthopedics;  Laterality: N/A;  . SPINAL CORD STIMULATOR BATTERY EXCHANGE N/A 07/22/2015   Procedure: REIMPLANTATION OF SPINAL CORD STIMULATOR BATTERY ;  Surgeon: Melina Schools, MD;  Location: [place];  Service: Orthopedics;  Laterality: N/A;  . TOTAL KNEE ARTHROPLASTY Left     Family History  Problem Relation Age of Onset  . Heart attack Mother      Social History   Social History  . Marital status: Married    Spouse name: N/A  . Number of children: N/A  . Years of education: N/A   Occupational History  . Not on file.   Social History Main Topics  . Smoking status: Former Smoker    Types: Cigarettes  . Smokeless tobacco: Never Used     Comment: quit smoking 38yr ago  . Alcohol use No  . Drug use: No  . Sexual activity: Yes    Birth control/ protection: Surgical   Other Topics Concern  . Not on file   Social History Narrative   ** Merged History Encounter **         Current Outpatient Prescriptions:  .  ACCU-CHEK FASTCLIX LANCETS MISC, , Disp: , Rfl:  .  albuterol (PROVENTIL HFA;VENTOLIN HFA) 108 (90 Base) MCG/ACT inhaler, Inhale 2 puffs into the lungs every 6 (six) hours as needed (for wheezing and shortness of breath from seasonal allergies)., Disp: 1 Inhaler, Rfl: 1 .  albuterol (PROVENTIL) (2.5 MG/3ML) 0.083% nebulizer solution, , Disp: , Rfl:  .  atorvastatin (LIPITOR) 40 MG tablet, TAKE 1 TABLET EVERY DAY, Disp: 90 tablet, Rfl: 1 .  Blood Glucose Calibration (ACCU-CHEK SMARTVIEW CONTROL) LIQD, , Disp: , Rfl:  .  Blood Glucose Monitoring Suppl (ACCU-CHEK NANO SMARTVIEW) w/Device KIT, CHECK BLOOD SUGAR TWICE DAILY, Disp: 1 kit, Rfl: 0 .  cyclobenzaprine (FLEXERIL) 5 MG tablet, Take 1 tablet (5 mg total) by mouth every 8 (eight) hours as needed for muscle spasms., Disp: 30 tablet, Rfl: 1 .  fluticasone (FLONASE) 50 MCG/ACT nasal spray, USE 1 SPRAY IN EACH NOSTRIL TWICE DAILY, Disp: 48 g, Rfl: 3 .  Fluticasone-Salmeterol (ADVAIR DISKUS) 250-50 MCG/DOSE AEPB, Inhale 1 puff into the lungs 2 (two) times daily., Disp: 14 each, Rfl: 1 .  glucose blood (ACCU-CHEK SMARTVIEW) test strip, TEST BLOOD SUGARS 4 TIMES DAILY, Disp: 150 each, Rfl: PRN .  Insulin Pen Needle (BD ULTRA-FINE PEN NEEDLES) 29G X 12.7MM MISC, 1 pen by Does not apply route once., Disp: 100 each, Rfl: 3 .  ipratropium-albuterol (DUONEB) 0.5-2.5 (3)  MG/3ML SOLN, Take 3 mLs by nebulization every 4 (four) hours as needed., Disp: 360 mL, Rfl: 3 .  LEVEMIR FLEXTOUCH 100 UNIT/ML Pen, INJECT  5 UNITS SUBCUTANEOUSLY AT BEDTIME (DISCARD PEN 42 DAYS AFTER OPENING), Disp: 2 pen, Rfl: 0 .  lisinopril-hydrochlorothiazide (PRINZIDE,ZESTORETIC) 20-25 MG tablet, TAKE 1 TABLET EVERY DAY, Disp: 90 tablet, Rfl: 1 .  montelukast (SINGULAIR) 10 MG tablet, TAKE 1 TABLET EVERY DAY, Disp: 90 tablet, Rfl: 1 .  ondansetron (ZOFRAN) 4 MG tablet, TAKE 1 TABLET EVERY 8 HOURS AS NEEDED FOR NAUSEA OR VOMITING, Disp: 20 tablet, Rfl: 0 .  oxyCODONE-acetaminophen (PERCOCET) 10-325 MG tablet, Take 1 tablet by mouth  every 4 (four) hours as needed for pain., Disp: , Rfl:  .  Vitamin D, Ergocalciferol, (DRISDOL) 50000 units CAPS capsule, TAKE ONE CAPSULE BY MOUTH EVERY 7 DAYS, Disp: 12 capsule, Rfl: 0 .  chlorpheniramine-HYDROcodone (TUSSIONEX PENNKINETIC ER) 10-8 MG/5ML SUER, Take 5 mLs by mouth every 12 (twelve) hours as needed. (Patient not taking: Reported on 03/29/2016), Disp: 240 mL, Rfl: 0 .  ciprofloxacin (CIPRO) 500 MG tablet, Take 1 tablet (500 mg total) by mouth 2 (two) times daily. (Patient not taking: Reported on 03/29/2016), Disp: 14 tablet, Rfl: 0 .  citalopram (CELEXA) 10 MG tablet, TAKE 1 TABLET EVERY DAY  FOR  1  WEEK  THEN TAKE 2 TABLETS EVERY DAY (Patient not taking: Reported on 02/16/2016), Disp: 180 tablet, Rfl: 3 .  ibuprofen (ADVIL,MOTRIN) 800 MG tablet, Take 1 tablet (800 mg total) by mouth every 8 (eight) hours as needed. (Patient not taking: Reported on 03/29/2016), Disp: 30 tablet, Rfl: 0 .  meloxicam (MOBIC) 15 MG tablet, Take 1 tablet (15 mg total) by mouth daily. (Patient not taking: Reported on 03/29/2016), Disp: 14 tablet, Rfl: 0 .  metroNIDAZOLE (FLAGYL) 500 MG tablet, Take 1 tablet (500 mg total) by mouth 2 (two) times daily. (Patient not taking: Reported on 03/29/2016), Disp: 14 tablet, Rfl: 0  Allergies  Allergen Reactions  . Adhesive [Tape] Rash and Other  (See Comments)    Regular tape is ok, allergy is to paper tape     Review of Systems  Constitutional: Negative for fatigue and fever.  Cardiovascular: Negative for chest pain.  Genitourinary: Positive for flank pain. Negative for dysuria.  Neurological: Negative for numbness.  Endo/Heme/Allergies: Negative for polydipsia.    Objective  Vitals:   03/29/16 1432  BP: 132/72  Pulse: 86  Resp: 15  Temp: 97.9 F (36.6 C)  TempSrc: Oral  SpO2: 96%  Weight: 178 lb 8 oz (81 kg)  Height: '5\' 3"'$  (1.6 m)    Physical Exam  Constitutional: She is well-developed, well-nourished, and in no distress.  HENT:  Head: Normocephalic and atraumatic.  Cardiovascular: Normal rate, regular rhythm and normal heart sounds.   No murmur heard. Pulmonary/Chest: Effort normal and breath sounds normal. She has no wheezes.  Abdominal: Soft. Bowel sounds are normal. There is tenderness in the right lower quadrant. There is no rebound and no CVA tenderness.    Musculoskeletal:       Lumbar back: She exhibits tenderness and pain.       Back:  Mild tenderness to palpation in the right lateral flank area radiating down and around into the right lower quadrant.  Nursing note and vitals reviewed.   Assessment & Plan  1. Hypercholesterolemia  - COMPLETE METABOLIC PANEL WITH GFR - Lipid Profile  2. Uncontrolled type 2 diabetes mellitus without complication, with long-term current use of insulin (HCC) A1c is elevated to 11.7%, we will continue on Levemir at 16 units, add Januvia 100 mg daily to patient's regimen. - Insulin Detemir (LEVEMIR FLEXTOUCH) 100 UNIT/ML Pen; Inject 16 Units into the skin daily at 10 pm.  Dispense: 4 pen; Refill: 1 - sitaGLIPtin (JANUVIA) 100 MG tablet; Take 1 tablet (100 mg total) by mouth daily.  Dispense: 90 tablet; Refill: 0 - POCT Glucose (CBG) - POCT HgB A1C - Urine Microalbumin w/creat. ratio  3. Abdominal wall pain in right flank Likely muscular in etiology, obtain  urinalysis to rule out lower/upper urinary tract pathology. Started on muscle relaxant - CBC with Differential - tiZANidine (ZANAFLEX) 4  MG capsule; Take 1 capsule (4 mg total) by mouth 3 (three) times daily.  Dispense: 21 capsule; Refill: 0 - Urinalysis, Routine w reflex microscopic - Urine Culture   Jebidiah Baggerly Asad A. [facility] 03/29/2016 3:12 PM

## 2016-04-19 ENCOUNTER — Ambulatory Visit (INDEPENDENT_AMBULATORY_CARE_PROVIDER_SITE_OTHER): Payer: Commercial Managed Care - HMO | Admitting: Family Medicine

## 2016-04-19 ENCOUNTER — Encounter: Payer: Self-pay | Admitting: Family Medicine

## 2016-04-19 DIAGNOSIS — G8929 Other chronic pain: Secondary | ICD-10-CM | POA: Diagnosis not present

## 2016-04-19 DIAGNOSIS — R232 Flushing: Secondary | ICD-10-CM | POA: Diagnosis not present

## 2016-04-19 DIAGNOSIS — M5441 Lumbago with sciatica, right side: Secondary | ICD-10-CM

## 2016-04-19 MED ORDER — METAXALONE 800 MG PO TABS: 800.0000 mg | ORAL_TABLET | Freq: Three times a day (TID) | ORAL | 0 refills | Status: DC

## 2016-04-19 MED ORDER — VENLAFAXINE HCL ER 37.5 MG PO CP24: 37.5000 mg | ORAL_CAPSULE | Freq: Every day | ORAL | 0 refills | Status: DC

## 2016-04-19 NOTE — Progress Notes (Signed)
Name: Lori Potts   MRN: [id number]    DOB: 22-Nov-1945   Date:04/19/2016       Progress Note  Subjective  Chief Complaint  Chief Complaint  Patient presents with  . Flank Pain    right    HPI  Patient presents for evaluation of right lower back pain, long history of chronic back pain, had 2 back surgeries and now has a back stimulator put in 2015. She feels the pain is 8/10, worse with laying down, she is on chronic opioid therapy for the same. Recently, she feels as if the pain has gotten worse. She was started on muscle relaxant therapy which helped only marginally. Back pain radiates down into the right lower leg at the level of the right knee, which feels numb.   Hot Flashes: Pt. Reports having hot flashes for many years, started after she turned 50 and progressed to menopause. She feels sweaty, hot, and irritable. Cannot take an HRT. Had a hysterectomy at age 54 for heavy menstrual cycles.    Past Medical History:  Diagnosis Date  . Arthritis   . Cataract    right eye but immature  . Diabetes mellitus without complication ([place])   . Essential hypertension, benign    takes Lisinopril-HCTZ daily  . GERD (gastroesophageal reflux disease)   . History of colon polyps    benign  . History of migraine    last one 2-3 months ago  . History of shingles   . Hyperlipidemia    takes Lipitor daily  . Hypertension   . Joint pain   . Low back pain   . Nocturia   . Seasonal allergies    takes Singulair daily as needed  . Type II or unspecified type diabetes mellitus without mention of complication, not stated as uncontrolled    Levemir nightly;average fasting blood sugar 125-140  . Unspecified essential hypertension   . Vitamin D deficiency    takes Vit D weekly  . Weakness    numbness and tingling left arm    Past Surgical History:  Procedure Laterality Date  . ABDOMINAL HYSTERECTOMY    . ANTERIOR CERVICAL DECOMP/DISCECTOMY FUSION N/A 02/26/2015   Procedure: ANTERIOR  CERVICAL DISCECTOMY FUSION C4-5 (1 LEVEL);  Surgeon: Melina Schools, MD;  Location: [place];  Service: Orthopedics;  Laterality: N/A;  . ANTERIOR LAT LUMBAR FUSION N/A 03/21/2013   Procedure: ANTERIOR LATERAL LUMBAR FUSION 1 LEVEL/ XLIF L3-L4 ;  Surgeon: Melina Schools, MD;  Location: [place];  Service: Orthopedics;  Laterality: N/A;  . APPENDECTOMY    . BACK SURGERY    . BACK SURGERY     Lumbar fusion x 2  . BREAST SURGERY  1990   Augementation  . CARDIAC CATHETERIZATION  5/12   ef 55%  . CARDIAC CATHETERIZATION  10/2010   [facility]; EF 55%  . CARDIAC CATHETERIZATION Left 02/16/2016   Procedure: Left Heart Cath and Coronary Angiography;  Surgeon: Yolonda Kida, MD;  Location: [place] [facility];  Service: Cardiovascular;  Laterality: Left;  . COLONOSCOPY    . ESOPHAGOGASTRODUODENOSCOPY    . [place]   growth removed from left kidney   . pain stimulator    . POSTERIOR CERVICAL FUSION/FORAMINOTOMY Right 03/21/2013   Procedure: POSTERIOR L2-3 RIGHT FORAMINOTOMY;  Surgeon: Melina Schools, MD;  Location: [place];  Service: Orthopedics;  Laterality: Right;  . REVISION OF SCAR TISSUE RECTUS MUSCLE    . SMALL BOWEL REPAIR    . SPINAL  CORD STIMULATOR BATTERY EXCHANGE N/A 10/17/2012   Procedure: SPINAL CORD STIMULATOR BATTERY REMOVAL;  Surgeon: Melina Schools, MD;  Location: [place];  Service: Orthopedics;  Laterality: N/A;  . SPINAL CORD STIMULATOR BATTERY EXCHANGE N/A 07/22/2015   Procedure: REIMPLANTATION OF SPINAL CORD STIMULATOR BATTERY ;  Surgeon: Melina Schools, MD;  Location: [place];  Service: Orthopedics;  Laterality: N/A;  . TOTAL KNEE ARTHROPLASTY Left     Family History  Problem Relation Age of Onset  . Heart attack Mother     Social History   Social History  . Marital status: Married    Spouse name: N/A  . Number of children: N/A  . Years of education: N/A   Occupational History  . Not on file.   Social History Main Topics  . Smoking status: Former Smoker    Types:  Cigarettes  . Smokeless tobacco: Never Used     Comment: quit smoking 45yr ago  . Alcohol use No  . Drug use: No  . Sexual activity: Yes    Birth control/ protection: Surgical   Other Topics Concern  . Not on file   Social History Narrative   ** Merged History Encounter **         Current Outpatient Prescriptions:  .  ACCU-CHEK FASTCLIX LANCETS MISC, , Disp: , Rfl:  .  albuterol (PROVENTIL HFA;VENTOLIN HFA) 108 (90 Base) MCG/ACT inhaler, Inhale 2 puffs into the lungs every 6 (six) hours as needed (for wheezing and shortness of breath from seasonal allergies)., Disp: 1 Inhaler, Rfl: 1 .  albuterol (PROVENTIL) (2.5 MG/3ML) 0.083% nebulizer solution, , Disp: , Rfl:  .  atorvastatin (LIPITOR) 40 MG tablet, TAKE 1 TABLET EVERY DAY, Disp: 90 tablet, Rfl: 1 .  Blood Glucose Calibration (ACCU-CHEK SMARTVIEW CONTROL) LIQD, , Disp: , Rfl:  .  Blood Glucose Monitoring Suppl (ACCU-CHEK NANO SMARTVIEW) w/Device KIT, CHECK BLOOD SUGAR TWICE DAILY, Disp: 1 kit, Rfl: 0 .  fluticasone (FLONASE) 50 MCG/ACT nasal spray, USE 1 SPRAY IN EACH NOSTRIL TWICE DAILY, Disp: 48 g, Rfl: 3 .  Fluticasone-Salmeterol (ADVAIR DISKUS) 250-50 MCG/DOSE AEPB, Inhale 1 puff into the lungs 2 (two) times daily., Disp: 14 each, Rfl: 1 .  glucose blood (ACCU-CHEK SMARTVIEW) test strip, TEST BLOOD SUGARS 4 TIMES DAILY, Disp: 150 each, Rfl: PRN .  ibuprofen (ADVIL,MOTRIN) 800 MG tablet, Take 1 tablet (800 mg total) by mouth every 8 (eight) hours as needed., Disp: 30 tablet, Rfl: 0 .  Insulin Detemir (LEVEMIR FLEXTOUCH) 100 UNIT/ML Pen, Inject 16 Units into the skin daily at 10 pm., Disp: 4 pen, Rfl: 1 .  Insulin Pen Needle (BD ULTRA-FINE PEN NEEDLES) 29G X 12.7MM MISC, 1 pen by Does not apply route once., Disp: 100 each, Rfl: 3 .  ipratropium-albuterol (DUONEB) 0.5-2.5 (3) MG/3ML SOLN, Take 3 mLs by nebulization every 4 (four) hours as needed., Disp: 360 mL, Rfl: 3 .  lisinopril-hydrochlorothiazide (PRINZIDE,ZESTORETIC) 20-25  MG tablet, TAKE 1 TABLET EVERY DAY, Disp: 90 tablet, Rfl: 1 .  montelukast (SINGULAIR) 10 MG tablet, TAKE 1 TABLET EVERY DAY, Disp: 90 tablet, Rfl: 1 .  ondansetron (ZOFRAN) 4 MG tablet, TAKE 1 TABLET EVERY 8 HOURS AS NEEDED FOR NAUSEA OR VOMITING, Disp: 20 tablet, Rfl: 0 .  oxyCODONE-acetaminophen (PERCOCET) 10-325 MG tablet, Take 1 tablet by mouth every 4 (four) hours as needed for pain., Disp: , Rfl:  .  sitaGLIPtin (JANUVIA) 100 MG tablet, Take 1 tablet (100 mg total) by mouth daily., Disp: 90 tablet, Rfl: 0 .  tiZANidine (ZANAFLEX) 4 MG capsule, Take 1 capsule (4 mg total) by mouth 3 (three) times daily., Disp: 21 capsule, Rfl: 0 .  Vitamin D, Ergocalciferol, (DRISDOL) 50000 units CAPS capsule, TAKE ONE CAPSULE BY MOUTH EVERY 7 DAYS, Disp: 12 capsule, Rfl: 0  Allergies  Allergen Reactions  . Adhesive [Tape] Rash and Other (See Comments)    Regular tape is ok, allergy is to paper tape     Review of Systems  Constitutional: Negative for chills, fever, malaise/fatigue and weight loss.  Musculoskeletal: Positive for back pain and joint pain.  Neurological: Negative for tingling.  Psychiatric/Behavioral: Negative for depression. The patient is not nervous/anxious.     Objective  Vitals:   04/19/16 1403  BP: 132/73  Pulse: 78  Resp: 15  Temp: 97.6 F (36.4 C)  TempSrc: Oral  SpO2: 96%  Weight: 180 lb 6.4 oz (81.8 kg)  Height: '5\' 3"'$  (1.6 m)    Physical Exam  Constitutional: She is well-developed, well-nourished, and in no distress.  Cardiovascular: Normal rate, regular rhythm, S1 normal, S2 normal and normal heart sounds.   No murmur heard. Pulmonary/Chest: Effort normal and breath sounds normal. She has no wheezes. She has no rhonchi.  Abdominal: Normal appearance.  Musculoskeletal:       Lumbar back: She exhibits tenderness, pain and spasm.       Back:  Psychiatric: Mood, memory, affect and judgment normal.  Nursing note and vitals reviewed.    Assessment &  Plan  1. Chronic right-sided low back pain with right-sided sciatica DC tizanidine and start on Skelaxin - metaxalone (SKELAXIN) 800 MG tablet; Take 1 tablet (800 mg total) by mouth 3 (three) times daily.  Dispense: 30 tablet; Refill: 0  2. Vasomotor flushing Start on Effexor X are 37.5 mg daily for hot flashes. Reassess in 6 weeks - venlafaxine XR (EFFEXOR-XR) 37.5 MG 24 hr capsule; Take 1 capsule (37.5 mg total) by mouth daily with breakfast.  Dispense: 90 capsule; Refill: 0  Zahari Xiang Asad A. [facility] 04/19/2016 2:14 PM

## 2016-04-22 DIAGNOSIS — M542 Cervicalgia: Secondary | ICD-10-CM | POA: Diagnosis not present

## 2016-04-22 DIAGNOSIS — M961 Postlaminectomy syndrome, not elsewhere classified: Secondary | ICD-10-CM | POA: Diagnosis not present

## 2016-04-22 DIAGNOSIS — Z79891 Long term (current) use of opiate analgesic: Secondary | ICD-10-CM | POA: Diagnosis not present

## 2016-04-22 DIAGNOSIS — G894 Chronic pain syndrome: Secondary | ICD-10-CM | POA: Diagnosis not present

## 2016-04-22 DIAGNOSIS — M5136 Other intervertebral disc degeneration, lumbar region: Secondary | ICD-10-CM | POA: Diagnosis not present

## 2016-04-27 ENCOUNTER — Ambulatory Visit (INDEPENDENT_AMBULATORY_CARE_PROVIDER_SITE_OTHER): Payer: Commercial Managed Care - HMO

## 2016-04-27 ENCOUNTER — Ambulatory Visit
Admission: EM | Admit: 2016-04-27 | Discharge: 2016-04-27 | Disposition: A | Discharge status: Home / Self Care | Payer: Commercial Managed Care - HMO | Attending: Family Medicine | Admitting: Family Medicine

## 2016-04-27 DIAGNOSIS — M25511 Pain in right shoulder: Secondary | ICD-10-CM

## 2016-04-27 DIAGNOSIS — S46911A Strain of unspecified muscle, fascia and tendon at shoulder and upper arm level, right arm, initial encounter: Secondary | ICD-10-CM | POA: Diagnosis not present

## 2016-04-27 MED ORDER — KETOROLAC TROMETHAMINE 60 MG/2ML IM SOLN
60.0000 mg | Freq: Once | INTRAMUSCULAR | Status: AC
  Administered 2016-04-27: 60 mg via INTRAMUSCULAR

## 2016-04-27 NOTE — Discharge Instructions (Signed)
Recommend patient follow up with orthopedics within the next few days, if pain is tolerable patient should try to do range of motion exercises as discussed, ice when necessary, may need further imaging if symptoms persist or worsen as discussed.

## 2016-04-27 NOTE — ED Triage Notes (Signed)
Patient complains of right shoulder pain. Patient states that she was moving furniture and pushed an object too heavy for her. Patient states that this occurred today and has been constant. Patient states that she is on a pain contract with Dr. Ethelene Halamos and she took one percocet before coming here today and it has not touched.

## 2016-04-27 NOTE — ED Provider Notes (Signed)
[facility]    CSN: [id number] Arrival date & time: 04/27/16  1929  First Provider Contact:  None       History   Chief Complaint Chief Complaint  Patient presents with  . Shoulder Pain    HPI Lori Potts is a 70 y.o. female.   HPI: Patient presents today with symptoms of right shoulder pain. Patient states that earlier today she was moving furniture when she experienced right shoulder pain. She denies any previous history of right shoulder problems. She is on a pain contract for back-related pain. She did take a Percocet which did not seem to help much. She denies any tingling or numbness in the arm. She denies any chest pain or shortness of breath. She has difficulty with moving the right arm in all directions due to the pain. She denies any other injury to the shoulder such as hitting it against anything or falling on it. Past Medical History:  Diagnosis Date  . Arthritis   . Cataract    right eye but immature  . Diabetes mellitus without complication ([place])   . Essential hypertension, benign    takes Lisinopril-HCTZ daily  . GERD (gastroesophageal reflux disease)   . History of colon polyps    benign  . History of migraine    last one 2-3 months ago  . History of shingles   . Hyperlipidemia    takes Lipitor daily  . Hypertension   . Joint pain   . Low back pain   . Nocturia   . Seasonal allergies    takes Singulair daily as needed  . Type II or unspecified type diabetes mellitus without mention of complication, not stated as uncontrolled    Levemir nightly;average fasting blood sugar 125-140  . Unspecified essential hypertension   . Vitamin D deficiency    takes Vit D weekly  . Weakness    numbness and tingling left arm    Patient Active Problem List   Diagnosis Date Noted  . 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 Vasomotor flushing 04/19/2016  . Abdominal wall pain in right flank 03/29/2016  .  Diverticulosis 02/24/2016  . Bronchitis with bronchospasm 10/06/2015  . Type 2 diabetes mellitus ([place]) 06/16/2015  . Hypercholesterolemia 06/16/2015  . Acute serous otitis media 04/13/2015  . Subacute maxillary sinusitis 04/13/2015  . FHx: migraine headaches 04/13/2015  . Chronic pain 04/13/2015  . Neck pain 02/26/2015  . Status post lumbar surgery 02/26/2015  . Abnormal ECG 01/27/2015  . Angina pectoris ([place]) 01/27/2015  . Chest pain 01/27/2015  . Diabetes mellitus type 2, uncontrolled, without complications ([place]) 53/29/9242  . Hypercholesteremia 01/27/2015  . BP (high blood pressure) 01/27/2015    Past Surgical History:  Procedure Laterality Date  . ABDOMINAL HYSTERECTOMY    . ANTERIOR CERVICAL DECOMP/DISCECTOMY FUSION N/A 02/26/2015   Procedure: ANTERIOR CERVICAL DISCECTOMY FUSION C4-5 (1 LEVEL);  Surgeon: Melina Schools, MD;  Location: [place];  Service: Orthopedics;  Laterality: N/A;  . ANTERIOR LAT LUMBAR FUSION N/A 03/21/2013   Procedure: ANTERIOR LATERAL LUMBAR FUSION 1 LEVEL/ XLIF L3-L4 ;  Surgeon: Melina Schools, MD;  Location: [place];  Service: Orthopedics;  Laterality: N/A;  . APPENDECTOMY    . BACK SURGERY    . BACK SURGERY     Lumbar fusion x 2  . BREAST SURGERY  1990   Augementation  . CARDIAC CATHETERIZATION  5/12   ef 55%  . CARDIAC CATHETERIZATION  10/2010  [facility]; EF 55%  . CARDIAC CATHETERIZATION Left 02/16/2016   Procedure: Left Heart Cath and Coronary Angiography;  Surgeon: Yolonda Kida, MD;  Location: [place] [facility];  Service: Cardiovascular;  Laterality: Left;  . COLONOSCOPY    . ESOPHAGOGASTRODUODENOSCOPY    . [place]   growth removed from left kidney   . pain stimulator    . POSTERIOR CERVICAL FUSION/FORAMINOTOMY Right 03/21/2013   Procedure: POSTERIOR L2-3 RIGHT FORAMINOTOMY;  Surgeon: Melina Schools, MD;  Location: [place];  Service: Orthopedics;  Laterality: Right;  . REVISION OF SCAR TISSUE RECTUS MUSCLE    . SMALL BOWEL REPAIR    .  SPINAL CORD STIMULATOR BATTERY EXCHANGE N/A 10/17/2012   Procedure: SPINAL CORD STIMULATOR BATTERY REMOVAL;  Surgeon: Melina Schools, MD;  Location: [place];  Service: Orthopedics;  Laterality: N/A;  . SPINAL CORD STIMULATOR BATTERY EXCHANGE N/A 07/22/2015   Procedure: REIMPLANTATION OF SPINAL CORD STIMULATOR BATTERY ;  Surgeon: Melina Schools, MD;  Location: [place];  Service: Orthopedics;  Laterality: N/A;  . TOTAL KNEE ARTHROPLASTY Left     OB History    Gravida Para Term Preterm AB Living   0 0 0 0 0     SAB TAB Ectopic Multiple Live Births   0 0 0           Home Medications    Prior to Admission medications   Medication Sig Start Date End Date Taking? Authorizing Provider  ACCU-CHEK FASTCLIX LANCETS [place]  08/04/15  Yes Historical Provider, MD  albuterol (PROVENTIL HFA;VENTOLIN HFA) 108 (90 Base) MCG/ACT inhaler Inhale 2 puffs into the lungs every 6 (six) hours as needed (for wheezing and shortness of breath from seasonal allergies). 09/30/15  Yes Ashok Norris, MD  albuterol (PROVENTIL) (2.5 MG/3ML) 0.083% nebulizer solution  11/28/15  Yes Historical Provider, MD  atorvastatin (LIPITOR) 40 MG tablet TAKE 1 TABLET EVERY DAY 10/21/15  Yes Ashok Norris, MD  Blood Glucose Calibration (ACCU-CHEK SMARTVIEW CONTROL) LIQD  08/04/15  Yes Historical Provider, MD  Blood Glucose Monitoring Suppl (ACCU-CHEK NANO SMARTVIEW) w/Device KIT CHECK BLOOD SUGAR TWICE DAILY 03/28/16  Yes Roselee Nova, MD  fluticasone (FLONASE) 50 MCG/ACT nasal spray USE 1 SPRAY IN EACH NOSTRIL TWICE DAILY 10/21/15  Yes Ashok Norris, MD  Fluticasone-Salmeterol (ADVAIR DISKUS) 250-50 MCG/DOSE AEPB Inhale 1 puff into the lungs 2 (two) times daily. 12/30/15  Yes Clearnce Sorrel Burnette, PA-C  glucose blood (ACCU-CHEK SMARTVIEW) test strip TEST BLOOD SUGARS 4 TIMES DAILY 08/07/15  Yes Ashok Norris, MD  ibuprofen (ADVIL,MOTRIN) 800 MG tablet Take 1 tablet (800 mg total) by mouth every 8 (eight) hours as needed. 08/04/15  Yes Ashok Norris, MD  Insulin Detemir (LEVEMIR FLEXTOUCH) 100 UNIT/ML Pen Inject 16 Units into the skin daily at 10 pm. 03/29/16  Yes Roselee Nova, MD  Insulin Pen Needle (BD ULTRA-FINE PEN NEEDLES) 29G X 12.7MM MISC 1 pen by Does not apply route once. 03/11/15  Yes Ashok Norris, MD  ipratropium-albuterol (DUONEB) 0.5-2.5 (3) MG/3ML SOLN Take 3 mLs by nebulization every 4 (four) hours as needed. 12/01/15  Yes Clearnce Sorrel Burnette, PA-C  lisinopril-hydrochlorothiazide (PRINZIDE,ZESTORETIC) 20-25 MG tablet TAKE 1 TABLET EVERY DAY 10/21/15  Yes Ashok Norris, MD  metaxalone (SKELAXIN) 800 MG tablet Take 1 tablet (800 mg total) by mouth 3 (three) times daily. 04/19/16  Yes Roselee Nova, MD  montelukast (SINGULAIR) 10 MG tablet TAKE 1 TABLET EVERY DAY 10/21/15  Yes Ashok Norris, MD  ondansetron (ZOFRAN) 4 MG tablet TAKE 1 TABLET EVERY 8 HOURS AS NEEDED FOR NAUSEA OR VOMITING 02/01/16  Yes Ashok Norris, MD  oxyCODONE-acetaminophen (PERCOCET) 10-325 MG tablet Take 1 tablet by mouth every 4 (four) hours as needed for pain.   Yes Historical Provider, MD  sitaGLIPtin (JANUVIA) 100 MG tablet Take 1 tablet (100 mg total) by mouth daily. 03/29/16  Yes Roselee Nova, MD  venlafaxine XR (EFFEXOR-XR) 37.5 MG 24 hr capsule Take 1 capsule (37.5 mg total) by mouth daily with breakfast. 04/19/16  Yes Roselee Nova, MD  Vitamin D, Ergocalciferol, (DRISDOL) 50000 units CAPS capsule TAKE ONE CAPSULE BY MOUTH EVERY 7 DAYS 02/01/16  Yes Ashok Norris, MD    Family History Family History  Problem Relation Age of Onset  . Heart attack Mother     Social History Social History  Substance Use Topics  . Smoking status: Former Smoker    Types: Cigarettes  . Smokeless tobacco: Never Used     Comment: quit smoking 47yr ago  . Alcohol use No     Allergies   Adhesive [tape]   Review of Systems Review of Systems: Negative except mentioned above.    Physical Exam Triage Vital Signs ED Triage Vitals  Enc  Vitals Group     BP 04/27/16 1939 (!) 159/59     Pulse Rate 04/27/16 1939 77     Resp 04/27/16 1939 17     Temp 04/27/16 1939 97.8 F (36.6 C)     Temp Source 04/27/16 1939 Tympanic     SpO2 04/27/16 1939 99 %     Weight 04/27/16 1936 180 lb (81.6 kg)     Height 04/27/16 1936 '5\' 3"'$  (1.6 m)     Head Circumference --      Peak Flow --      Pain Score 04/27/16 1938 10     Pain Loc --      Pain Edu? --      Excl. in [place] --    No data found.   Updated Vital Signs BP (!) 159/59 (BP Location: Left Arm)   Pulse 77   Temp 97.8 F (36.6 C) (Tympanic)   Resp 17   Ht '5\' 3"'$  (1.6 m)   Wt 180 lb (81.6 kg)   SpO2 99%   BMI 31.89 kg/m      Physical Exam:  GENERAL: mild discomfort RESP: CTA B CARD: RRR MSK: No obvious deformity of right shoulder, mild to moderate anterior shoulder tenderness, range of motion limited in all directions, forward flexion only to 90, NV intact NEURO: CN II-XII groslly intact    UC Treatments / Results  Labs (all labs ordered are listed, but only abnormal results are displayed) Labs Reviewed - No data to display  EKG  EKG Interpretation None       Radiology No results found.  Procedures Procedures (including critical care time)  Medications Ordered in UC Medications - No data to display   Initial Impression / Assessment and Plan / UC Course  I have reviewed the triage vital signs and the nursing notes.  Pertinent labs & imaging results that were available during my care of the patient were reviewed by me and considered in my medical decision making (see chart for details).  Clinical Course   A/P: R Shoulder Pain - Toradol '60mg'$  IM given for pain, Xray negative for fx, sling for comfort, patient has narcotic pain medication at home (is on pain med contract),  ice prn, keep elevated, f/u with orthopedics in the next few days, seek medical attention sooner if worsening symptoms, if tolerable work on range of motion exercises as discussed,  may need MRI if symptoms persist or worsen to evaluate RTC further.   Final Clinical Impressions(s) / UC Diagnoses   Final diagnoses:  None    New Prescriptions New Prescriptions   No medications on file     Paulina Fusi, MD 04/27/16 2005

## 2016-04-29 DIAGNOSIS — M25511 Pain in right shoulder: Secondary | ICD-10-CM | POA: Diagnosis not present

## 2016-05-12 ENCOUNTER — Telehealth: Payer: Self-pay | Admitting: Emergency Medicine

## 2016-05-12 NOTE — Telephone Encounter (Signed)
Patient called coughing and congested. Would like cough syrup refilled

## 2016-05-12 NOTE — Telephone Encounter (Signed)
Patient will need an appointment to evaluate her symptoms and prescribe appropriate therapy.

## 2016-05-13 ENCOUNTER — Other Ambulatory Visit: Payer: Self-pay | Admitting: *Deleted

## 2016-05-13 ENCOUNTER — Encounter: Payer: Self-pay | Admitting: Family Medicine

## 2016-05-13 ENCOUNTER — Ambulatory Visit (INDEPENDENT_AMBULATORY_CARE_PROVIDER_SITE_OTHER): Payer: Commercial Managed Care - HMO | Admitting: Family Medicine

## 2016-05-13 DIAGNOSIS — J0100 Acute maxillary sinusitis, unspecified: Secondary | ICD-10-CM | POA: Insufficient documentation

## 2016-05-13 MED ORDER — AZITHROMYCIN 250 MG PO TABS: ORAL_TABLET | ORAL | 0 refills | Status: DC

## 2016-05-13 MED ORDER — BENZONATATE 200 MG PO CAPS: 200.0000 mg | ORAL_CAPSULE | Freq: Three times a day (TID) | ORAL | 0 refills | Status: DC | PRN

## 2016-05-13 NOTE — Patient Outreach (Signed)
[facility] Care Management  05/13/2016  Lori Potts 11-29-1945 [id number]  RN Health Coach  Attempted screening  outreach call to patient.  Patient was unavailable. No voice mail pickup. Plan: RN will call patient again within 14 days.   [name] Shaquoia Miers BSN RN [facility] Care Management [phone] .

## 2016-05-13 NOTE — Progress Notes (Signed)
Name: Lori Potts   MRN: [id number]    DOB: 1946-05-27   Date:05/13/2016       Progress Note  Subjective  Chief Complaint  Chief Complaint  Patient presents with  . Cough    x1 week  . Nasal Congestion    Sinusitis  This is a new problem. The current episode started 1 to 4 weeks ago (2 weeks ago). The problem has been gradually worsening since onset. There has been no fever. Associated symptoms include congestion, coughing, sinus pressure and a sore throat. Pertinent negatives include no chills or shortness of breath. Treatments tried: Nasal spray for congestion and ginger candies for cough.     Past Medical History:  Diagnosis Date  . Arthritis   . Cataract    right eye but immature  . Diabetes mellitus without complication ([place])   . Essential hypertension, benign    takes Lisinopril-HCTZ daily  . GERD (gastroesophageal reflux disease)   . History of colon polyps    benign  . History of migraine    last one 2-3 months ago  . History of shingles   . Hyperlipidemia    takes Lipitor daily  . Hypertension   . Joint pain   . Low back pain   . Nocturia   . Seasonal allergies    takes Singulair daily as needed  . Type II or unspecified type diabetes mellitus without mention of complication, not stated as uncontrolled    Levemir nightly;average fasting blood sugar 125-140  . Unspecified essential hypertension   . Vitamin D deficiency    takes Vit D weekly  . Weakness    numbness and tingling left arm    Past Surgical History:  Procedure Laterality Date  . ABDOMINAL HYSTERECTOMY    . ANTERIOR CERVICAL DECOMP/DISCECTOMY FUSION N/A 02/26/2015   Procedure: ANTERIOR CERVICAL DISCECTOMY FUSION C4-5 (1 LEVEL);  Surgeon: Melina Schools, MD;  Location: [place];  Service: Orthopedics;  Laterality: N/A;  . ANTERIOR LAT LUMBAR FUSION N/A 03/21/2013   Procedure: ANTERIOR LATERAL LUMBAR FUSION 1 LEVEL/ XLIF L3-L4 ;  Surgeon: Melina Schools, MD;  Location: [place];  Service: Orthopedics;   Laterality: N/A;  . APPENDECTOMY    . BACK SURGERY    . BACK SURGERY     Lumbar fusion x 2  . BREAST SURGERY  1990   Augementation  . CARDIAC CATHETERIZATION  5/12   ef 55%  . CARDIAC CATHETERIZATION  10/2010   [facility]; EF 55%  . CARDIAC CATHETERIZATION Left 02/16/2016   Procedure: Left Heart Cath and Coronary Angiography;  Surgeon: Yolonda Kida, MD;  Location: [place] [facility];  Service: Cardiovascular;  Laterality: Left;  . COLONOSCOPY    . ESOPHAGOGASTRODUODENOSCOPY    . [place]   growth removed from left kidney   . pain stimulator    . POSTERIOR CERVICAL FUSION/FORAMINOTOMY Right 03/21/2013   Procedure: POSTERIOR L2-3 RIGHT FORAMINOTOMY;  Surgeon: Melina Schools, MD;  Location: [place];  Service: Orthopedics;  Laterality: Right;  . REVISION OF SCAR TISSUE RECTUS MUSCLE    . SMALL BOWEL REPAIR    . SPINAL CORD STIMULATOR BATTERY EXCHANGE N/A 10/17/2012   Procedure: SPINAL CORD STIMULATOR BATTERY REMOVAL;  Surgeon: Melina Schools, MD;  Location: [place];  Service: Orthopedics;  Laterality: N/A;  . SPINAL CORD STIMULATOR BATTERY EXCHANGE N/A 07/22/2015   Procedure: REIMPLANTATION OF SPINAL CORD STIMULATOR BATTERY ;  Surgeon: Melina Schools, MD;  Location: [place];  Service: Orthopedics;  Laterality:  N/A;  . TOTAL KNEE ARTHROPLASTY Left     Family History  Problem Relation Age of Onset  . Heart attack Mother     Social History   Social History  . Marital status: Married    Spouse name: N/A  . Number of children: N/A  . Years of education: N/A   Occupational History  . Not on file.   Social History Main Topics  . Smoking status: Former Smoker    Types: Cigarettes  . Smokeless tobacco: Never Used     Comment: quit smoking 17yr ago  . Alcohol use No  . Drug use: No  . Sexual activity: Yes    Birth control/ protection: Surgical   Other Topics Concern  . Not on file   Social History Narrative   ** Merged History Encounter **         Current Outpatient  Prescriptions:  .  ACCU-CHEK FASTCLIX LANCETS MISC, , Disp: , Rfl:  .  albuterol (PROVENTIL HFA;VENTOLIN HFA) 108 (90 Base) MCG/ACT inhaler, Inhale 2 puffs into the lungs every 6 (six) hours as needed (for wheezing and shortness of breath from seasonal allergies)., Disp: 1 Inhaler, Rfl: 1 .  albuterol (PROVENTIL) (2.5 MG/3ML) 0.083% nebulizer solution, , Disp: , Rfl:  .  atorvastatin (LIPITOR) 40 MG tablet, TAKE 1 TABLET EVERY DAY, Disp: 90 tablet, Rfl: 1 .  Blood Glucose Calibration (ACCU-CHEK SMARTVIEW CONTROL) LIQD, , Disp: , Rfl:  .  Blood Glucose Monitoring Suppl (ACCU-CHEK NANO SMARTVIEW) w/Device KIT, CHECK BLOOD SUGAR TWICE DAILY, Disp: 1 kit, Rfl: 0 .  fluticasone (FLONASE) 50 MCG/ACT nasal spray, USE 1 SPRAY IN EACH NOSTRIL TWICE DAILY, Disp: 48 g, Rfl: 3 .  Fluticasone-Salmeterol (ADVAIR DISKUS) 250-50 MCG/DOSE AEPB, Inhale 1 puff into the lungs 2 (two) times daily., Disp: 14 each, Rfl: 1 .  glucose blood (ACCU-CHEK SMARTVIEW) test strip, TEST BLOOD SUGARS 4 TIMES DAILY, Disp: 150 each, Rfl: PRN .  ibuprofen (ADVIL,MOTRIN) 800 MG tablet, Take 1 tablet (800 mg total) by mouth every 8 (eight) hours as needed., Disp: 30 tablet, Rfl: 0 .  Insulin Detemir (LEVEMIR FLEXTOUCH) 100 UNIT/ML Pen, Inject 16 Units into the skin daily at 10 pm., Disp: 4 pen, Rfl: 1 .  Insulin Pen Needle (BD ULTRA-FINE PEN NEEDLES) 29G X 12.7MM MISC, 1 pen by Does not apply route once., Disp: 100 each, Rfl: 3 .  ipratropium-albuterol (DUONEB) 0.5-2.5 (3) MG/3ML SOLN, Take 3 mLs by nebulization every 4 (four) hours as needed., Disp: 360 mL, Rfl: 3 .  lisinopril-hydrochlorothiazide (PRINZIDE,ZESTORETIC) 20-25 MG tablet, TAKE 1 TABLET EVERY DAY, Disp: 90 tablet, Rfl: 1 .  metaxalone (SKELAXIN) 800 MG tablet, Take 1 tablet (800 mg total) by mouth 3 (three) times daily., Disp: 30 tablet, Rfl: 0 .  montelukast (SINGULAIR) 10 MG tablet, TAKE 1 TABLET EVERY DAY, Disp: 90 tablet, Rfl: 1 .  ondansetron (ZOFRAN) 4 MG tablet, TAKE  1 TABLET EVERY 8 HOURS AS NEEDED FOR NAUSEA OR VOMITING, Disp: 20 tablet, Rfl: 0 .  oxyCODONE-acetaminophen (PERCOCET) 10-325 MG tablet, Take 1 tablet by mouth every 4 (four) hours as needed for pain., Disp: , Rfl:  .  sitaGLIPtin (JANUVIA) 100 MG tablet, Take 1 tablet (100 mg total) by mouth daily., Disp: 90 tablet, Rfl: 0 .  venlafaxine XR (EFFEXOR-XR) 37.5 MG 24 hr capsule, Take 1 capsule (37.5 mg total) by mouth daily with breakfast., Disp: 90 capsule, Rfl: 0 .  Vitamin D, Ergocalciferol, (DRISDOL) 50000 units CAPS capsule, TAKE ONE CAPSULE BY  MOUTH EVERY 7 DAYS, Disp: 12 capsule, Rfl: 0  Allergies  Allergen Reactions  . Adhesive [Tape] Rash and Other (See Comments)    Regular tape is ok, allergy is to paper tape     Review of Systems  Constitutional: Negative for chills and fever.  HENT: Positive for congestion, sinus pressure and sore throat.   Respiratory: Positive for cough. Negative for sputum production and shortness of breath.   Cardiovascular: Negative for chest pain.    Objective  Vitals:   05/13/16 1150  BP: 140/77  Pulse: 87  Resp: 16  Temp: 98 F (36.7 C)  TempSrc: Oral  SpO2: 98%  Weight: 179 lb (81.2 kg)  Height: '5\' 3"'$  (1.6 m)    Physical Exam  Constitutional: She is well-developed, well-nourished, and in no distress.  HENT:  Head: Normocephalic and atraumatic.  Right Ear: Tympanic membrane and ear canal normal.  Left Ear: Tympanic membrane and ear canal normal.  Nose: Right sinus exhibits no maxillary sinus tenderness. Left sinus exhibits maxillary sinus tenderness and frontal sinus tenderness.  Mouth/Throat: Posterior oropharyngeal erythema present.  Left nasal turbinate hypertrophy, right nasal mucosal inflammation  Cardiovascular: Normal rate, regular rhythm, S1 normal and S2 normal.   No murmur heard. Pulmonary/Chest: Effort normal and breath sounds normal. She has no wheezes. She has no rhonchi.  Nursing note and vitals reviewed.   Assessment  & Plan  1. Acute non-recurrent maxillary sinusitis By history and exam, started on antimicrobial and antitussive therapy. - azithromycin (ZITHROMAX) 250 MG tablet; 2 tabs po day 1, then 1 tab po q day x 4 days  Dispense: 6 tablet; Refill: 0 - benzonatate (TESSALON) 200 MG capsule; Take 1 capsule (200 mg total) by mouth 3 (three) times daily as needed.  Dispense: 20 capsule; Refill: 0   Bayley Hurn [facility] 05/13/2016 12:05 PM

## 2016-05-16 ENCOUNTER — Ambulatory Visit: Payer: Commercial Managed Care - HMO | Admitting: Family Medicine

## 2016-05-25 ENCOUNTER — Encounter: Payer: Self-pay | Admitting: *Deleted

## 2016-05-25 ENCOUNTER — Other Ambulatory Visit: Payer: Self-pay | Admitting: *Deleted

## 2016-05-26 ENCOUNTER — Encounter: Payer: Self-pay | Admitting: *Deleted

## 2016-05-26 NOTE — Patient Outreach (Addendum)
[facility]) Care Management  05/26/2016   ESTEPHANIE HUBBS 23-Dec-1945 [id number]  Subjective: RN Health Coach telephone call to patient.  [employer] compliance verified. Per patient she is doing pretty good.  Fasting blood sugar was 130. Patient is able to get around without use of cane or walker. Per patient her hobby is riding a motorcycle. Per patient she needs a knee replacement but is putting it off. Patient is being treated under pain management. Patient does not exercise much due to the knee pain. Patient does not have a living will and has accepted an advance directive packet to be sent to her. Patient takes medication as per physician order. Patient would like to get her A1C down to 6.5.Patient does not know  the signs and symptoms of high and low blood sugar. Patient barrier is knowledge Deficit in Self management of diabetes.Patint does have history of COPD and hypertension. Patient agreed to follow up outreach calls.  Objective:   Current Medications:  Current Outpatient Prescriptions  Medication Sig Dispense Refill  . ACCU-CHEK FASTCLIX LANCETS MISC     . albuterol (PROVENTIL HFA;VENTOLIN HFA) 108 (90 Base) MCG/ACT inhaler Inhale 2 puffs into the lungs every 6 (six) hours as needed (for wheezing and shortness of breath from seasonal allergies). 1 Inhaler 1  . albuterol (PROVENTIL) (2.5 MG/3ML) 0.083% nebulizer solution     . atorvastatin (LIPITOR) 40 MG tablet TAKE 1 TABLET EVERY DAY 90 tablet 1  . Blood Glucose Calibration (ACCU-CHEK SMARTVIEW CONTROL) LIQD     . Blood Glucose Monitoring Suppl (ACCU-CHEK NANO SMARTVIEW) w/Device KIT CHECK BLOOD SUGAR TWICE DAILY 1 kit 0  . fluticasone (FLONASE) 50 MCG/ACT nasal spray USE 1 SPRAY IN EACH NOSTRIL TWICE DAILY 48 g 3  . Fluticasone-Salmeterol (ADVAIR DISKUS) 250-50 MCG/DOSE AEPB Inhale 1 puff into the lungs 2 (two) times daily. 14 each 1  . glucose blood (ACCU-CHEK SMARTVIEW) test strip TEST BLOOD SUGARS 4 TIMES DAILY 150  each PRN  . ibuprofen (ADVIL,MOTRIN) 800 MG tablet Take 1 tablet (800 mg total) by mouth every 8 (eight) hours as needed. 30 tablet 0  . Insulin Detemir (LEVEMIR FLEXTOUCH) 100 UNIT/ML Pen Inject 16 Units into the skin daily at 10 pm. 4 pen 1  . Insulin Pen Needle (BD ULTRA-FINE PEN NEEDLES) 29G X 12.7MM MISC 1 pen by Does not apply route once. 100 each 3  . ipratropium-albuterol (DUONEB) 0.5-2.5 (3) MG/3ML SOLN Take 3 mLs by nebulization every 4 (four) hours as needed. 360 mL 3  . lisinopril-hydrochlorothiazide (PRINZIDE,ZESTORETIC) 20-25 MG tablet TAKE 1 TABLET EVERY DAY 90 tablet 1  . metaxalone (SKELAXIN) 800 MG tablet Take 1 tablet (800 mg total) by mouth 3 (three) times daily. 30 tablet 0  . montelukast (SINGULAIR) 10 MG tablet TAKE 1 TABLET EVERY DAY 90 tablet 1  . ondansetron (ZOFRAN) 4 MG tablet TAKE 1 TABLET EVERY 8 HOURS AS NEEDED FOR NAUSEA OR VOMITING 20 tablet 0  . oxyCODONE-acetaminophen (PERCOCET) 10-325 MG tablet Take 1 tablet by mouth every 4 (four) hours as needed for pain.    . sitaGLIPtin (JANUVIA) 100 MG tablet Take 1 tablet (100 mg total) by mouth daily. 90 tablet 0  . Vitamin D, Ergocalciferol, (DRISDOL) 50000 units CAPS capsule TAKE ONE CAPSULE BY MOUTH EVERY 7 DAYS 12 capsule 0  . azithromycin (ZITHROMAX) 250 MG tablet 2 tabs po day 1, then 1 tab po q day x 4 days (Patient not taking: Reported on 05/25/2016) 6 tablet 0  . benzonatate (  TESSALON) 200 MG capsule Take 1 capsule (200 mg total) by mouth 3 (three) times daily as needed. (Patient not taking: Reported on 05/25/2016) 20 capsule 0  . venlafaxine XR (EFFEXOR-XR) 37.5 MG 24 hr capsule Take 1 capsule (37.5 mg total) by mouth daily with breakfast. (Patient not taking: Reported on 05/25/2016) 90 capsule 0   No current facility-administered medications for this visit.     Functional Status:  In your present state of health, do you have any difficulty performing the following activities: 05/25/2016 05/13/2016  Hearing? N N   Vision? Y Y  Difficulty concentrating or making decisions? N N  Walking or climbing stairs? N N  Dressing or bathing? N N  Doing errands, shopping? N N  Preparing Food and eating ? N -  Using the Toilet? N -  In the past six months, have you accidently leaked urine? Y -  Do you have problems with loss of bowel control? N -  Managing your Medications? N -  Managing your Finances? N -  Housekeeping or managing your Housekeeping? N -  Some recent data might be hidden    Fall/Depression Screening: PHQ 2/9 Scores 05/25/2016 05/13/2016 04/19/2016 03/29/2016  PHQ - 2 Score 0 0 0 0   [facility] CM Care Plan Problem One   Flowsheet Row Most Recent Value  Care Plan Problem One  Knowledge Deficit in Self Management of Diabetes  Role Documenting the Problem One  Health Coach  Care Plan for Problem One  Active  [facility] Long Term Goal (31-90 days)  Patient ill have a decrease in Hgb A1C within the next 90 days  [facility] Long Term Goal Start Date  05/25/16  Interventions for Problem One Long Term Goal  RN sent patient educational material on what A1C means. How the blood glucose reading affect the A1C. RN will follow up with disucussion and teach back  [facility] CM Short Term Goal #1 (0-30 days)  Patient will be able to verbalize the signs and symptoms of High and low blood sugar within 30 days  [facility] CM Short Term Goal #1 Start Date  05/25/16  Interventions for Short Term Goal #1  RN sent picture chart of high and low blood sugar. RN sent Emmi educational material on high and low blood sugar. RN will follow up withdiscussion and teach back  [facility] CM Short Term Goal #2 (0-30 days)  Patient will report checking blood sugar as per Dr order and documentring within the next 30 days  [facility] CM Short Term Goal #2 Start Date  05/25/16  Interventions for Short Term Goal #2  RN sent a calendar book for document blood sugas and keeping appointment dates. RN discussed the importance of tracking your blood sugar. Making physician aware of  changes. Taking calendar with informattion on Dr visits.  [facility] CM Short Term Goal #3 (0-30 days)  Patient will report eating healthier snacks within the next 30 days  [facility] CM Short Term Goal #3 Start Date  05/25/16  Interventions for Short Tern Goal #3  RN sent patient a list of healthy snacks to eat. RN discussed as patient shopping to pick up some of the items as her snack. RN willfollow up with discussion and teachback.      Assessment:  Knowledge Deficit in Self Management of Diabetes Hgb A1C 11.7 Patient will benefit from Health Coach telephonic outreach for education and support for diabetes self management.   Plan:  RN sent Living well with Diabetes book RN sent [phone]  Calendar book RN sent Advance directive packet RN sent educational material on healthy snacks RN sent EMMI information on high and low blood sugar RN sent picture chart of high and low blood sugar RN sent barriers letter and assessment to physician RN will follow up outreach within the month of November for discussion and teach back  [facility] [phone]

## 2016-05-31 ENCOUNTER — Other Ambulatory Visit: Payer: Self-pay | Admitting: Family Medicine

## 2016-06-01 IMAGING — CR DG CERVICAL SPINE 2 OR 3 VIEWS
2 series · 2 of 2 positions shown · non-contrast
Comparison: No prior.

CLINICAL DATA: Postop cervical fusion.

EXAM:
CERVICAL SPINE - 2-3 VIEW

[AP (1 of 2)]
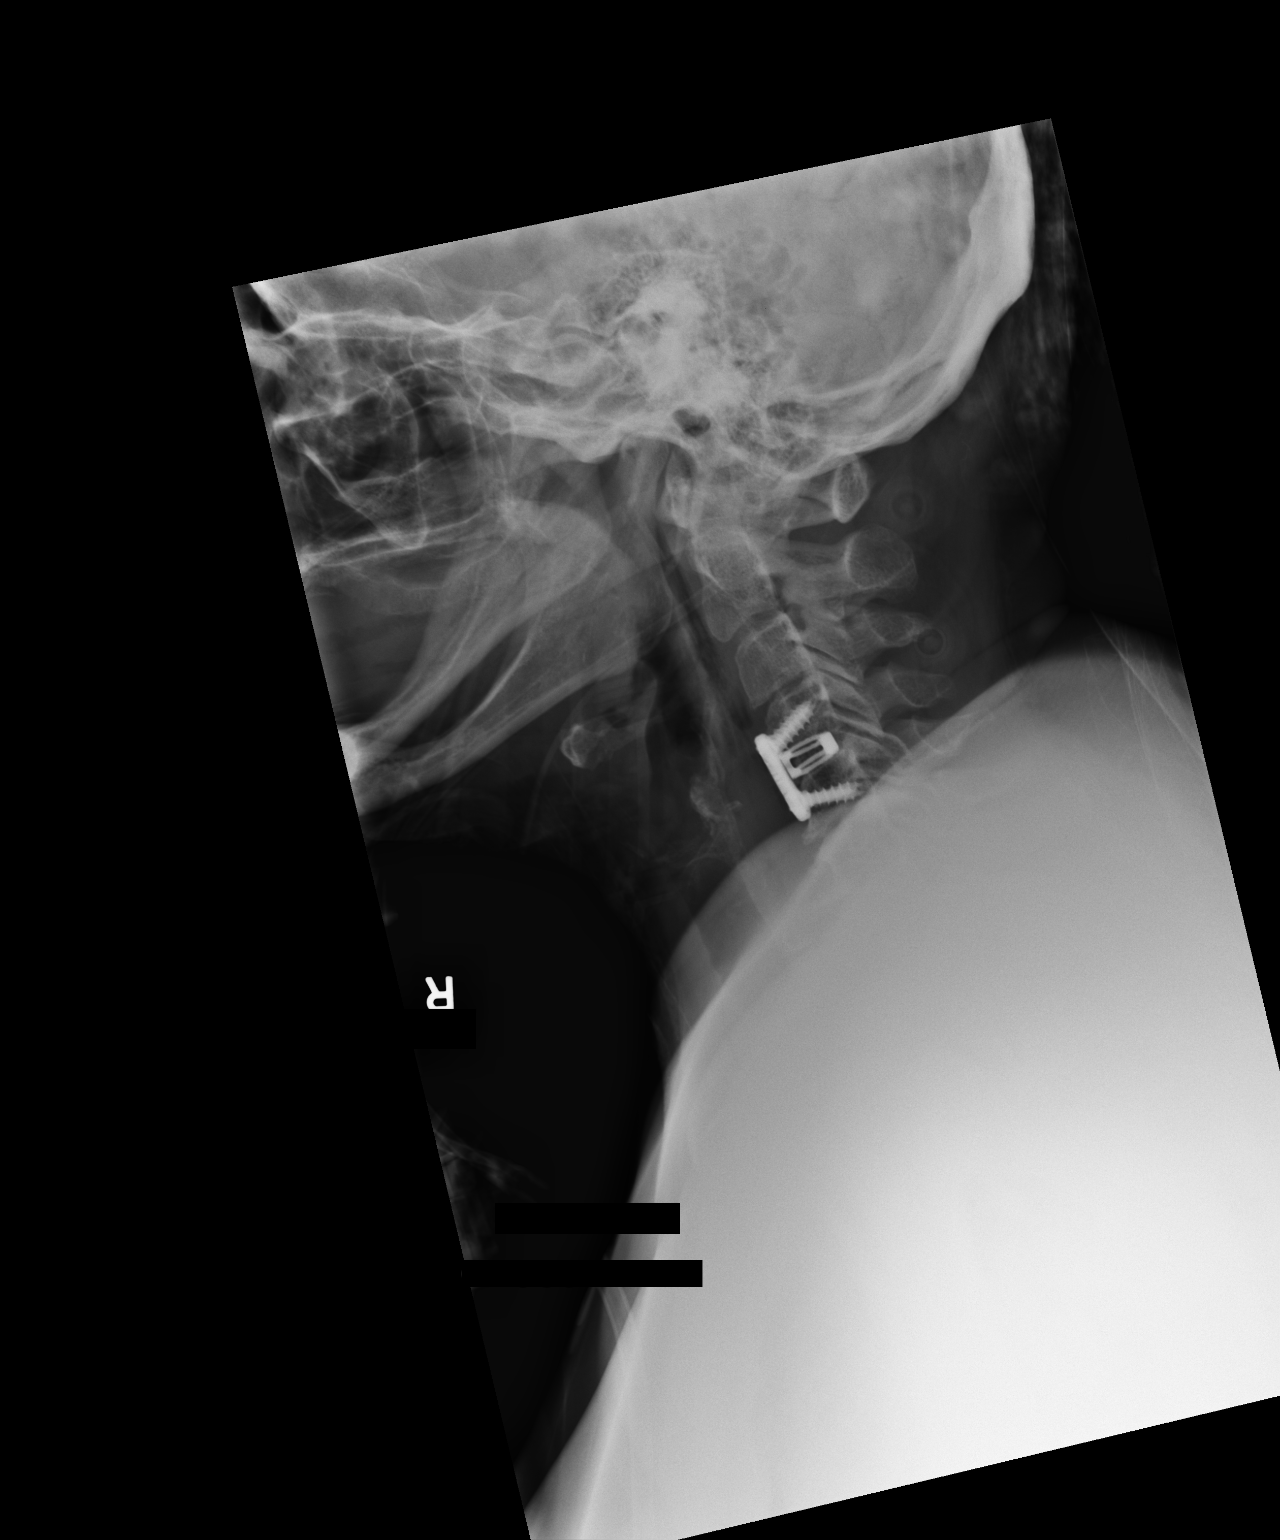

[AP (2 of 2)]
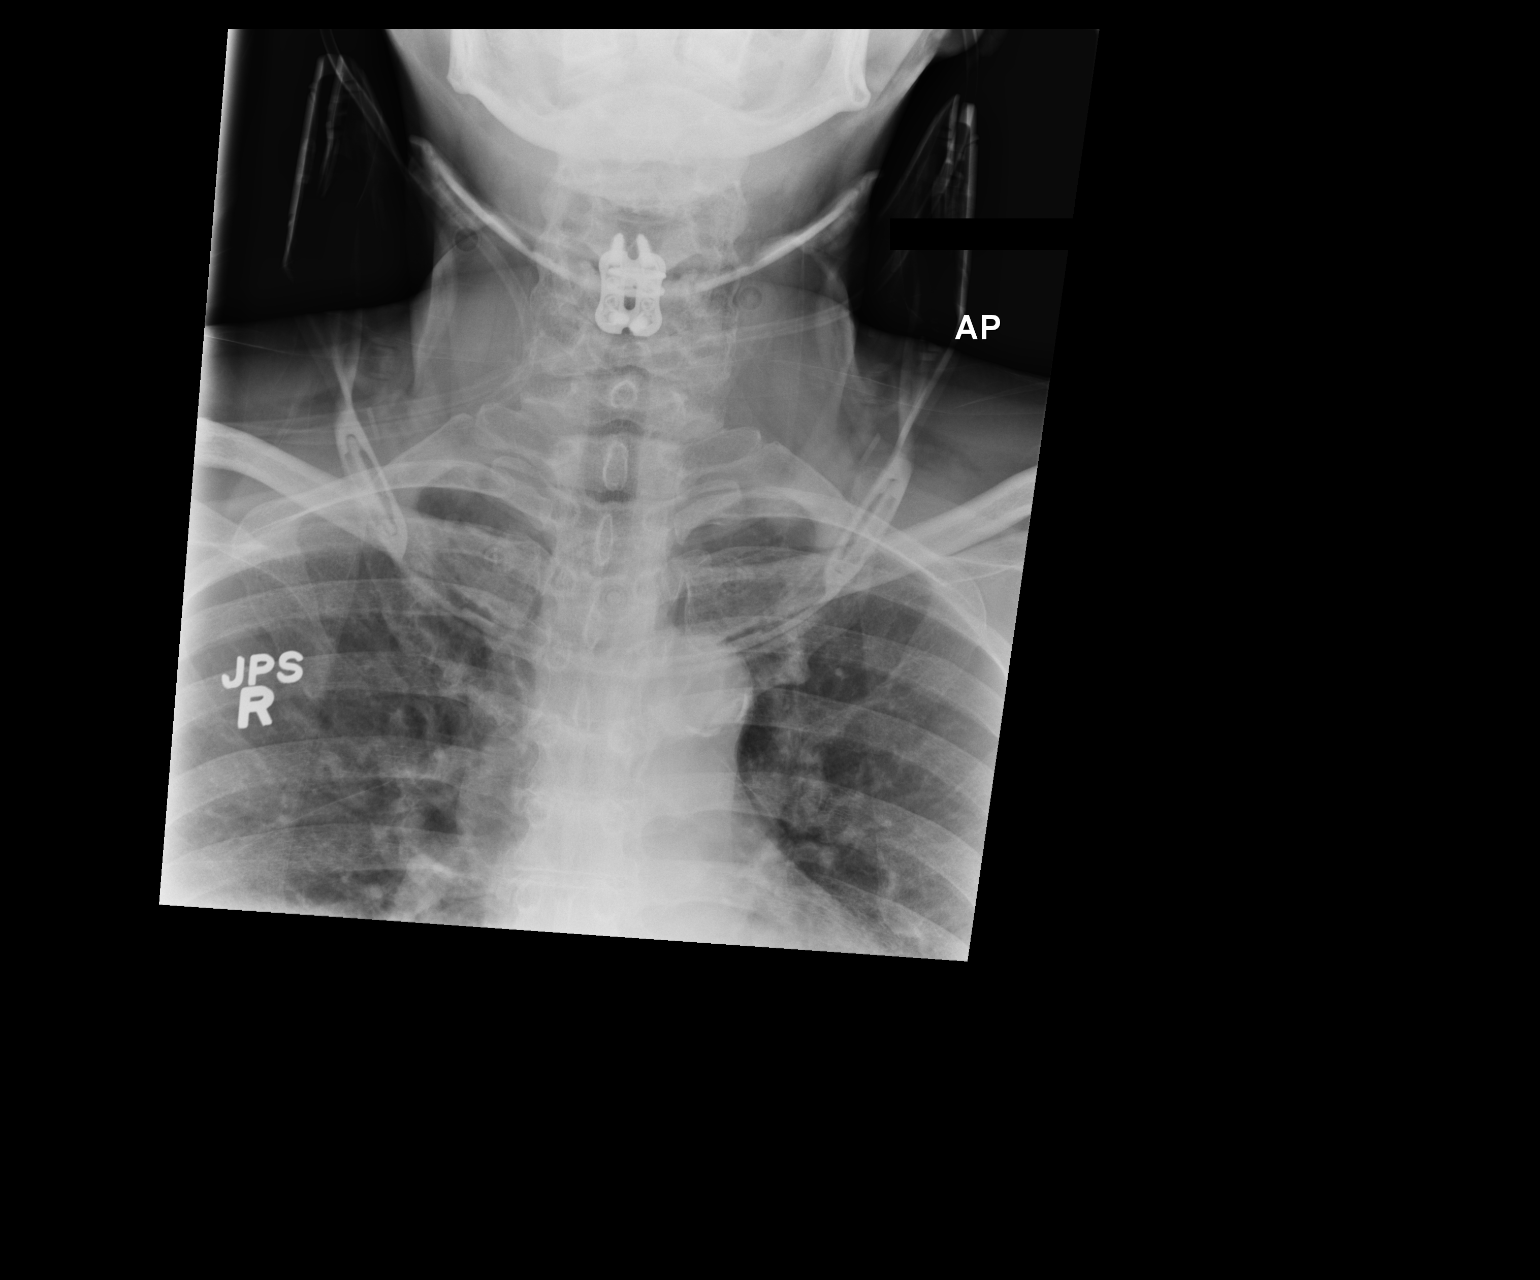

[2 of 2 positions shown; findings below may reference images not displayed]

FINDINGS: C4-C5 anterior and interbody fusion with good anatomic alignment. No
acute bony abnormality. Hardware intact.
IMPRESSION: C4-C5 anterior and interbody fusion with good anatomic alignment.

## 2016-06-02 DIAGNOSIS — M47816 Spondylosis without myelopathy or radiculopathy, lumbar region: Secondary | ICD-10-CM | POA: Diagnosis not present

## 2016-06-07 ENCOUNTER — Other Ambulatory Visit: Payer: Self-pay | Admitting: Family Medicine

## 2016-06-16 ENCOUNTER — Other Ambulatory Visit: Payer: Self-pay | Admitting: Family Medicine

## 2016-06-20 ENCOUNTER — Ambulatory Visit (INDEPENDENT_AMBULATORY_CARE_PROVIDER_SITE_OTHER): Payer: Commercial Managed Care - HMO

## 2016-06-20 DIAGNOSIS — Z23 Encounter for immunization: Secondary | ICD-10-CM | POA: Diagnosis not present

## 2016-06-29 ENCOUNTER — Other Ambulatory Visit: Payer: Self-pay | Admitting: *Deleted

## 2016-06-29 NOTE — Patient Outreach (Signed)
[facility]) Care Management  06/29/2016  Ward ChattersMary H Narine 08/12/1946 [id number]    RN Health Coach  Attempted screening  outreach call to patient.  Patient was unavailable. No voice mail pickup. Plan: RN will call patient again within 14 days.  [name] Anzley Dibbern BSN RN [facility] Care Management [phone]

## 2016-06-30 ENCOUNTER — Ambulatory Visit: Payer: Commercial Managed Care - HMO | Admitting: Family Medicine

## 2016-06-30 NOTE — Telephone Encounter (Signed)
LMOM to inform pt °

## 2016-06-30 NOTE — Telephone Encounter (Signed)
Pt. Needs an appointment for medication refills.

## 2016-06-30 NOTE — Telephone Encounter (Signed)
Pt needs refill on Lisinopril, Atorvastatin and Levemir to be sent to [facility].

## 2016-07-04 ENCOUNTER — Other Ambulatory Visit: Payer: Self-pay | Admitting: Family Medicine

## 2016-07-04 ENCOUNTER — Telehealth: Payer: Self-pay

## 2016-07-04 ENCOUNTER — Ambulatory Visit (INDEPENDENT_AMBULATORY_CARE_PROVIDER_SITE_OTHER): Payer: Commercial Managed Care - HMO | Admitting: Family Medicine

## 2016-07-04 ENCOUNTER — Encounter: Payer: Self-pay | Admitting: Family Medicine

## 2016-07-04 VITALS — BP 122/70 | HR 83 | Temp 98.2°F | Resp 16 | Ht 63.0 in | Wt 178.4 lb

## 2016-07-04 DIAGNOSIS — E1165 Type 2 diabetes mellitus with hyperglycemia: Secondary | ICD-10-CM | POA: Diagnosis not present

## 2016-07-04 DIAGNOSIS — IMO0001 Reserved for inherently not codable concepts without codable children: Secondary | ICD-10-CM

## 2016-07-04 DIAGNOSIS — I10 Essential (primary) hypertension: Secondary | ICD-10-CM | POA: Diagnosis not present

## 2016-07-04 DIAGNOSIS — J0110 Acute frontal sinusitis, unspecified: Secondary | ICD-10-CM | POA: Diagnosis not present

## 2016-07-04 DIAGNOSIS — Z794 Long term (current) use of insulin: Secondary | ICD-10-CM | POA: Diagnosis not present

## 2016-07-04 DIAGNOSIS — E7800 Pure hypercholesterolemia, unspecified: Secondary | ICD-10-CM

## 2016-07-04 DIAGNOSIS — R51 Headache: Secondary | ICD-10-CM | POA: Diagnosis not present

## 2016-07-04 DIAGNOSIS — G8929 Other chronic pain: Secondary | ICD-10-CM

## 2016-07-04 LAB — POCT UA - MICROALBUMIN: Microalbumin Ur, POC: 20 mg/L

## 2016-07-04 LAB — COMPLETE METABOLIC PANEL WITH GFR
ALT: 30 U/L — ABNORMAL HIGH (ref 6–29)
AST: 29 U/L (ref 10–35)
Albumin: 4.2 g/dL (ref 3.6–5.1)
Alkaline Phosphatase: 87 U/L (ref 33–130)
BUN: 14 mg/dL (ref 7–25)
CO2: 31 mmol/L (ref 20–31)
Calcium: 9.8 mg/dL (ref 8.6–10.4)
Chloride: 105 mmol/L (ref 98–110)
Creat: 0.71 mg/dL (ref 0.60–0.93)
GFR, Est African American: >89 mL/min (ref >=60)
GFR, Est Non African American: 87 mL/min (ref >=60)
Glucose, Bld: 107 mg/dL — ABNORMAL HIGH (ref 65–99)
Potassium: 4.1 mmol/L (ref 3.5–5.3)
Sodium: 143 mmol/L (ref 135–146)
Total Bilirubin: 0.4 mg/dL (ref 0.2–1.2)
Total Protein: 6.9 g/dL (ref 6.1–8.1)

## 2016-07-04 LAB — LIPID PANEL
Cholesterol: 213 mg/dL — ABNORMAL HIGH (ref <200)
HDL: 65 mg/dL (ref >50)
LDL Cholesterol: 130 mg/dL — ABNORMAL HIGH (ref <100)
Total CHOL/HDL Ratio: 3.3 Ratio (ref <5.0)
Triglycerides: 92 mg/dL (ref <150)
VLDL: 18 mg/dL (ref <30)

## 2016-07-04 LAB — POCT GLYCOSYLATED HEMOGLOBIN (HGB A1C): Hemoglobin A1C: 8.3

## 2016-07-04 LAB — GLUCOSE, POCT (MANUAL RESULT ENTRY): POC Glucose: 120 mg/dl — AB (ref 70–99)

## 2016-07-04 MED ORDER — ATORVASTATIN CALCIUM 40 MG PO TABS: 40.0000 mg | ORAL_TABLET | Freq: Every day | ORAL | 1 refills | Status: DC

## 2016-07-04 MED ORDER — VITAMIN D (ERGOCALCIFEROL) 1.25 MG (50000 UNIT) PO CAPS: 50000.0000 [IU] | ORAL_CAPSULE | ORAL | 0 refills | Status: DC

## 2016-07-04 MED ORDER — PROMETHAZINE HCL 25 MG/ML IJ SOLN: 25.0000 mg | Freq: Once | INTRAMUSCULAR | 0 refills | Status: DC

## 2016-07-04 MED ORDER — PROMETHAZINE HCL 25 MG/ML IJ SOLN
25.0000 mg | Freq: Once | INTRAMUSCULAR | Status: AC
  Administered 2016-07-04: 25 mg via INTRAMUSCULAR

## 2016-07-04 MED ORDER — PROMETHAZINE HCL 25 MG/ML IJ SOLN: 25.0000 mg | Freq: Once | INTRAMUSCULAR | Status: DC

## 2016-07-04 MED ORDER — SITAGLIPTIN PHOSPHATE 100 MG PO TABS: 100.0000 mg | ORAL_TABLET | Freq: Every day | ORAL | 1 refills | Status: DC

## 2016-07-04 MED ORDER — INSULIN DETEMIR 100 UNIT/ML FLEXPEN: 20.0000 [IU] | PEN_INJECTOR | Freq: Every day | SUBCUTANEOUS | 1 refills | Status: DC

## 2016-07-04 MED ORDER — KETOROLAC TROMETHAMINE 60 MG/2ML IM SOLN
60.0000 mg | Freq: Once | INTRAMUSCULAR | Status: AC
  Administered 2016-07-04: 60 mg via INTRAMUSCULAR

## 2016-07-04 MED ORDER — AZITHROMYCIN 250 MG PO TABS: ORAL_TABLET | ORAL | 0 refills | Status: DC

## 2016-07-04 MED ORDER — LISINOPRIL-HYDROCHLOROTHIAZIDE 20-25 MG PO TABS: 1.0000 | ORAL_TABLET | Freq: Every day | ORAL | 1 refills | Status: DC

## 2016-07-04 NOTE — Telephone Encounter (Signed)
Medication has been refilled and sent to [facility] 

## 2016-07-04 NOTE — Progress Notes (Signed)
Name: Lori Potts   MRN: [id number]    DOB: [birth year]   Date:07/04/2016       Progress Note  Subjective  Chief Complaint  Chief Complaint  Patient presents with  . Medication Refill    Diabetes  She presents for her follow-up diabetic visit. She has type 2 diabetes mellitus. Her disease course has been improving. Hypoglycemia symptoms include headaches. Pertinent negatives for diabetes include no blurred vision, no chest pain, no fatigue, no foot paresthesias, no polydipsia, no polyuria and no visual change. Pertinent negatives for diabetic complications include no CVA or heart disease. Current diabetic treatment includes intensive insulin program and oral agent (monotherapy). She is following a diabetic diet. Her breakfast blood glucose range is generally 110-130 mg/dl. An ACE inhibitor/angiotensin II receptor blocker is being taken. Eye exam is current.  Hypertension  This is a chronic problem. The problem is unchanged. The problem is controlled. Associated symptoms include headaches. Pertinent negatives include no blurred vision, chest pain, neck pain, orthopnea, palpitations or shortness of breath. Past treatments include ACE inhibitors and diuretics. There is no history of kidney disease, CAD/MI or CVA.  Hyperlipidemia  This is a chronic problem. The problem is uncontrolled. Recent lipid tests were reviewed and are high. Pertinent negatives include no chest pain, leg pain, myalgias or shortness of breath. Current antihyperlipidemic treatment includes statins.  Headache   This is a chronic (DIagnosed as migraine headaches vs sinus headaches, chronic, present practically all her life.) problem. The current episode started in the past 7 days (started three days ago). The problem has been gradually worsening. The pain is located in the frontal region. The pain quality is similar to prior headaches. The quality of the pain is described as throbbing. The pain is at a severity of 8/10.  Associated symptoms include coughing, nausea, photophobia, sinus pressure and vomiting. Pertinent negatives include no blurred vision, drainage, ear pain, fever, neck pain, phonophobia or visual change. The symptoms are aggravated by bright light, fatigue and weather changes. She has tried NSAIDs (has taken BC powder) for the symptoms. The treatment provided moderate relief. Her past medical history is significant for hypertension.     Past Medical History:  Diagnosis Date  . Arthritis   . Cataract    right eye but immature  . Diabetes mellitus without complication ([place])   . Essential hypertension, benign    takes Lisinopril-HCTZ daily  . GERD (gastroesophageal reflux disease)   . History of colon polyps    benign  . History of migraine    last one 2-3 months ago  . History of shingles   . Hyperlipidemia    takes Lipitor daily  . Hypertension   . Joint pain   . Low back pain   . Nocturia   . Seasonal allergies    takes Singulair daily as needed  . Type II or unspecified type diabetes mellitus without mention of complication, not stated as uncontrolled    Levemir nightly;average fasting blood sugar 125-140  . Unspecified essential hypertension   . Vitamin D deficiency    takes Vit D weekly  . Weakness    numbness and tingling left arm    Past Surgical History:  Procedure Laterality Date  . ABDOMINAL HYSTERECTOMY    . ANTERIOR CERVICAL DECOMP/DISCECTOMY FUSION N/A 02/26/2015   Procedure: ANTERIOR CERVICAL DISCECTOMY FUSION C4-5 (1 LEVEL);  Surgeon: Melina Schools, MD;  Location: [place];  Service: Orthopedics;  Laterality: N/A;  . ANTERIOR LAT LUMBAR FUSION N/A 03/21/2013  Procedure: ANTERIOR LATERAL LUMBAR FUSION 1 LEVEL/ XLIF L3-L4 ;  Surgeon: Melina Schools, MD;  Location: [place];  Service: Orthopedics;  Laterality: N/A;  . APPENDECTOMY    . BACK SURGERY    . BACK SURGERY     Lumbar fusion x 2  . BREAST SURGERY  1990   Augementation  . CARDIAC CATHETERIZATION  5/12   ef 55%   . CARDIAC CATHETERIZATION  10/2010   [facility]; EF 55%  . CARDIAC CATHETERIZATION Left 02/16/2016   Procedure: Left Heart Cath and Coronary Angiography;  Surgeon: Yolonda Kida, MD;  Location: [place] [facility];  Service: Cardiovascular;  Laterality: Left;  . COLONOSCOPY    . ESOPHAGOGASTRODUODENOSCOPY    . [place]   growth removed from left kidney   . pain stimulator    . POSTERIOR CERVICAL FUSION/FORAMINOTOMY Right 03/21/2013   Procedure: POSTERIOR L2-3 RIGHT FORAMINOTOMY;  Surgeon: Melina Schools, MD;  Location: [place];  Service: Orthopedics;  Laterality: Right;  . REVISION OF SCAR TISSUE RECTUS MUSCLE    . SMALL BOWEL REPAIR    . SPINAL CORD STIMULATOR BATTERY EXCHANGE N/A 10/17/2012   Procedure: SPINAL CORD STIMULATOR BATTERY REMOVAL;  Surgeon: Melina Schools, MD;  Location: [place];  Service: Orthopedics;  Laterality: N/A;  . SPINAL CORD STIMULATOR BATTERY EXCHANGE N/A 07/22/2015   Procedure: REIMPLANTATION OF SPINAL CORD STIMULATOR BATTERY ;  Surgeon: Melina Schools, MD;  Location: [place];  Service: Orthopedics;  Laterality: N/A;  . TOTAL KNEE ARTHROPLASTY Left     Family History  Problem Relation Age of Onset  . Heart attack Mother     Social History   Social History  . Marital status: Married    Spouse name: N/A  . Number of children: N/A  . Years of education: N/A   Occupational History  . Not on file.   Social History Main Topics  . Smoking status: Former Smoker    Types: Cigarettes  . Smokeless tobacco: Never Used     Comment: quit smoking 70yr ago  . Alcohol use No  . Drug use: No  . Sexual activity: Yes    Birth control/ protection: Surgical   Other Topics Concern  . Not on file   Social History Narrative   ** Merged History Encounter **         Current Outpatient Prescriptions:  .  ACCU-CHEK FASTCLIX LANCETS MISC, , Disp: , Rfl:  .  albuterol (PROVENTIL HFA;VENTOLIN HFA) 108 (90 Base) MCG/ACT inhaler, Inhale 2 puffs into the lungs every 6  (six) hours as needed (for wheezing and shortness of breath from seasonal allergies)., Disp: 1 Inhaler, Rfl: 1 .  albuterol (PROVENTIL) (2.5 MG/3ML) 0.083% nebulizer solution, , Disp: , Rfl:  .  atorvastatin (LIPITOR) 40 MG tablet, TAKE 1 TABLET EVERY DAY, Disp: 90 tablet, Rfl: 1 .  benzonatate (TESSALON) 200 MG capsule, Take 1 capsule (200 mg total) by mouth 3 (three) times daily as needed., Disp: 20 capsule, Rfl: 0 .  Blood Glucose Calibration (ACCU-CHEK SMARTVIEW CONTROL) LIQD, , Disp: , Rfl:  .  Blood Glucose Monitoring Suppl (ACCU-CHEK NANO SMARTVIEW) w/Device KIT, CHECK BLOOD SUGAR TWICE DAILY, Disp: 1 kit, Rfl: 0 .  fluticasone (FLONASE) 50 MCG/ACT nasal spray, USE 1 SPRAY IN EACH NOSTRIL TWICE DAILY, Disp: 48 g, Rfl: 3 .  Fluticasone-Salmeterol (ADVAIR DISKUS) 250-50 MCG/DOSE AEPB, Inhale 1 puff into the lungs 2 (two) times daily., Disp: 14 each, Rfl: 1 .  glucose blood (ACCU-CHEK SMARTVIEW) test strip,  TEST BLOOD SUGARS 4 TIMES DAILY, Disp: 150 each, Rfl: PRN .  ibuprofen (ADVIL,MOTRIN) 800 MG tablet, Take 1 tablet (800 mg total) by mouth every 8 (eight) hours as needed., Disp: 30 tablet, Rfl: 0 .  Insulin Detemir (LEVEMIR FLEXTOUCH) 100 UNIT/ML Pen, Inject 16 Units into the skin daily at 10 pm., Disp: 4 pen, Rfl: 1 .  Insulin Pen Needle (BD ULTRA-FINE PEN NEEDLES) 29G X 12.7MM MISC, 1 pen by Does not apply route once., Disp: 100 each, Rfl: 3 .  ipratropium-albuterol (DUONEB) 0.5-2.5 (3) MG/3ML SOLN, Take 3 mLs by nebulization every 4 (four) hours as needed., Disp: 360 mL, Rfl: 3 .  lisinopril-hydrochlorothiazide (PRINZIDE,ZESTORETIC) 20-25 MG tablet, TAKE 1 TABLET EVERY DAY, Disp: 90 tablet, Rfl: 1 .  metaxalone (SKELAXIN) 800 MG tablet, Take 1 tablet (800 mg total) by mouth 3 (three) times daily., Disp: 30 tablet, Rfl: 0 .  montelukast (SINGULAIR) 10 MG tablet, TAKE 1 TABLET EVERY DAY, Disp: 90 tablet, Rfl: 1 .  ondansetron (ZOFRAN) 4 MG tablet, TAKE 1 TABLET EVERY 8 HOURS AS NEEDED FOR  NAUSEA OR VOMITING, Disp: 20 tablet, Rfl: 0 .  oxyCODONE-acetaminophen (PERCOCET) 10-325 MG tablet, Take 1 tablet by mouth every 4 (four) hours as needed for pain., Disp: , Rfl:  .  sitaGLIPtin (JANUVIA) 100 MG tablet, Take 1 tablet (100 mg total) by mouth daily., Disp: 90 tablet, Rfl: 0 .  venlafaxine XR (EFFEXOR-XR) 37.5 MG 24 hr capsule, Take 1 capsule (37.5 mg total) by mouth daily with breakfast., Disp: 90 capsule, Rfl: 0 .  Vitamin D, Ergocalciferol, (DRISDOL) 50000 units CAPS capsule, TAKE ONE CAPSULE BY MOUTH EVERY 7 DAYS, Disp: 12 capsule, Rfl: 0 .  azithromycin (ZITHROMAX) 250 MG tablet, 2 tabs po day 1, then 1 tab po q day x 4 days (Patient not taking: Reported on 07/04/2016), Disp: 6 tablet, Rfl: 0  Allergies  Allergen Reactions  . Adhesive [Tape] Rash and Other (See Comments)    Regular tape is ok, allergy is to paper tape     Review of Systems  Constitutional: Negative for chills, fatigue and fever.  HENT: Positive for sinus pressure. Negative for ear pain.   Eyes: Positive for photophobia. Negative for blurred vision.  Respiratory: Positive for cough. Negative for shortness of breath.   Cardiovascular: Negative for chest pain, palpitations and orthopnea.  Gastrointestinal: Positive for nausea and vomiting.  Musculoskeletal: Negative for myalgias and neck pain.  Neurological: Positive for headaches.  Endo/Heme/Allergies: Negative for polydipsia.    Objective  Vitals:   07/04/16 0913  BP: 122/70  Pulse: 83  Resp: 16  Temp: 98.2 F (36.8 C)  TempSrc: Oral  SpO2: 96%  Weight: 178 lb 6.4 oz (80.9 kg)  Height: 5\' 3"  (1.6 m)    Physical Exam  Constitutional: She is oriented to person, place, and time and well-developed, well-nourished, and in no distress.  HENT:  Head: Normocephalic and atraumatic.  Nose: Right sinus exhibits maxillary sinus tenderness and frontal sinus tenderness. Left sinus exhibits maxillary sinus tenderness and frontal sinus tenderness.   Mouth/Throat: No posterior oropharyngeal erythema.  Nasal mucosal inflammation, turbinate hypertrophy  Cardiovascular: Normal rate, regular rhythm and normal heart sounds.   No murmur heard. Pulmonary/Chest: Effort normal and breath sounds normal. She has no wheezes.  Neurological: She is alert and oriented to person, place, and time.  Psychiatric: Mood, memory, affect and judgment normal.  Nursing note and vitals reviewed.      Assessment & Plan  1. Uncontrolled type 2  diabetes mellitus without complication, with long-term current use of insulin (HCC) A1c is 8.3%, consistent with poorly controlled diabetes. Continue on Januvia and Levemir - POCT HgB A1C - POCT Glucose (CBG) - sitaGLIPtin (JANUVIA) 100 MG tablet; Take 1 tablet (100 mg total) by mouth daily.  Dispense: 90 tablet; Refill: 1 - Insulin Detemir (LEVEMIR FLEXTOUCH) 100 UNIT/ML Pen; Inject 20 Units into the skin daily at 10 pm.  Dispense: 4 pen; Refill: 1 - POCT UA - Microalbumin  2. Hypercholesterolemia  - Lipid Profile - COMPLETE METABOLIC PANEL WITH GFR - atorvastatin (LIPITOR) 40 MG tablet; Take 1 tablet (40 mg total) by mouth daily.  Dispense: 90 tablet; Refill: 1  3. Essential hypertension - lisinopril-hydrochlorothiazide (PRINZIDE,ZESTORETIC) 20-25 MG tablet; Take 1 tablet by mouth daily.  Dispense: 90 tablet; Refill: 1  4. Acute non-recurrent frontal sinusitis  - azithromycin (ZITHROMAX) 250 MG tablet; 2 tabs po day 1, then 1 tab po q day x 4 days  Dispense: 6 tablet; Refill: 0  5. Chronic nonintractable headache, unspecified headache type  - ketorolac (TORADOL) injection 60 mg; Inject 2 mLs (60 mg total) into the muscle once. - promethazine (PHENERGAN) injection 25 mg; Inject 1 mL (25 mg total) into the muscle once.  Keynan Heffern Asad A. [facility] 07/04/2016 9:21 AM

## 2016-07-06 ENCOUNTER — Other Ambulatory Visit: Payer: Self-pay | Admitting: Family Medicine

## 2016-07-13 ENCOUNTER — Encounter: Payer: Self-pay | Admitting: *Deleted

## 2016-07-13 ENCOUNTER — Other Ambulatory Visit: Payer: Self-pay | Admitting: *Deleted

## 2016-07-13 NOTE — Patient Outreach (Signed)
[name]  07/13/2016   Lori Potts 06-05-1946 [id number]  Subjective: RN Health Coach telephone call to patient.  [employer] compliance verified.Per patient her blood sugar fasting was 110. Patient stated she doesn't feel as good as when it is 120. RN explained that for fasting blood sugars try to keep 130 or less. Patient  A1C is 8.3. Per patient she is trying to do better with her snacks and meals. Patient stated she needs additional information on meals and appreciate the help because she stated she wants to live. Patient has not developed an exercise routine.   Patient has not had any falls. Patient had agreed to follow up out reach calls.    Objective:   Current Medications:  Current Outpatient Prescriptions  Medication Sig Dispense Refill  . ACCU-CHEK FASTCLIX LANCETS MISC     . albuterol (PROVENTIL HFA;VENTOLIN HFA) 108 (90 Base) MCG/ACT inhaler Inhale 2 puffs into the lungs every 6 (six) hours as needed (for wheezing and shortness of breath from seasonal allergies). 1 Inhaler 1  . albuterol (PROVENTIL) (2.5 MG/3ML) 0.083% nebulizer solution     . atorvastatin (LIPITOR) 40 MG tablet Take 1 tablet (40 mg total) by mouth daily. 90 tablet 1  . azithromycin (ZITHROMAX) 250 MG tablet 2 tabs po day 1, then 1 tab po q day x 4 days 6 tablet 0  . Blood Glucose Calibration (ACCU-CHEK SMARTVIEW CONTROL) LIQD     . Blood Glucose Monitoring Suppl (ACCU-CHEK NANO SMARTVIEW) w/Device KIT CHECK BLOOD SUGAR TWICE DAILY 1 kit 0  . fluticasone (FLONASE) 50 MCG/ACT nasal spray USE 1 SPRAY IN EACH NOSTRIL TWICE DAILY 48 g 3  . Fluticasone-Salmeterol (ADVAIR DISKUS) 250-50 MCG/DOSE AEPB Inhale 1 puff into the lungs 2 (two) times daily. 14 each 1  . glucose blood (ACCU-CHEK SMARTVIEW) test strip TEST BLOOD SUGARS 4 TIMES DAILY 150 each PRN  . ibuprofen (ADVIL,MOTRIN) 800 MG tablet Take 1 tablet (800 mg total) by mouth every 8 (eight) hours as needed. 30 tablet 0  .  Insulin Detemir (LEVEMIR FLEXTOUCH) 100 UNIT/ML Pen Inject 20 Units into the skin daily at 10 pm. 4 pen 1  . Insulin Pen Needle (BD ULTRA-FINE PEN NEEDLES) 29G X 12.7MM MISC 1 pen by Does not apply route once. 100 each 3  . ipratropium-albuterol (DUONEB) 0.5-2.5 (3) MG/3ML SOLN Take 3 mLs by nebulization every 4 (four) hours as needed. 360 mL 3  . lisinopril-hydrochlorothiazide (PRINZIDE,ZESTORETIC) 20-25 MG tablet Take 1 tablet by mouth daily. 90 tablet 1  . metaxalone (SKELAXIN) 800 MG tablet Take 1 tablet (800 mg total) by mouth 3 (three) times daily. 30 tablet 0  . montelukast (SINGULAIR) 10 MG tablet TAKE 1 TABLET EVERY DAY 90 tablet 1  . ondansetron (ZOFRAN) 4 MG tablet TAKE 1 TABLET EVERY 8 HOURS AS NEEDED FOR NAUSEA OR VOMITING 20 tablet 0  . oxyCODONE-acetaminophen (PERCOCET) 10-325 MG tablet Take 1 tablet by mouth every 4 (four) hours as needed for pain.    . sitaGLIPtin (JANUVIA) 100 MG tablet Take 1 tablet (100 mg total) by mouth daily. 90 tablet 1  . venlafaxine XR (EFFEXOR-XR) 37.5 MG 24 hr capsule Take 1 capsule (37.5 mg total) by mouth daily with breakfast. 90 capsule 0  . Vitamin D, Ergocalciferol, (DRISDOL) 50000 units CAPS capsule Take 1 capsule (50,000 Units total) by mouth once a week. For 12 weeks 12 capsule 0   No current facility-administered medications for this visit.     Functional Status:  In your present state of health, do you have any difficulty performing the following activities: 07/13/2016 07/04/2016  Hearing? N N  Vision? N Y  Difficulty concentrating or making decisions? N N  Walking or climbing stairs? N N  Dressing or bathing? N N  Doing errands, shopping? N N  Preparing Food and eating ? - -  Using the Toilet? - -  In the past six months, have you accidently leaked urine? - -  Do you have problems with loss of bowel control? - -  Managing your Medications? - -  Managing your Finances? - -  Housekeeping or managing your Housekeeping? - -  Some  recent data might be hidden    Fall/Depression Screening: PHQ 2/9 Scores 07/13/2016 07/04/2016 05/25/2016 05/13/2016 04/19/2016 03/29/2016  PHQ - 2 Score 0 0 0 0 0 0   THN CM Care Plan Problem One   Flowsheet Row Most Recent Value  Care Plan Problem One  Knowledge Deficit in Self Management of Diabetes  Care Plan for Problem One  Active  THN Long Term Goal (31-90 days)  Patient ill have a decrease in Hgb A1C within the next 90 days  THN Long Term Goal Start Date  07/13/16  [facility] Long Term Goal Met Date  07/13/16  Interventions for Problem One Long Term Goal  RN sent patient educational material on what A1C means. How the blood glucose reading affect the A1C. RN will follow up with disucussion and teach back  THN CM Short Term Goal #1 (0-30 days)  Patient will be able to verbalize the signs and symptoms of High and low blood sugar within 30 days  THN CM Short Term Goal #1 Met Date  07/13/16  Interventions for Short Term Goal #1  RN sent picture chart of high and low blood sugar. RN sent Emmi educational material on high and low blood sugar. RN will follow up withdiscussion and teach back  THN CM Short Term Goal #2 (0-30 days)  Patient will report checking blood sugar as per Dr order and documentring within the next 30 days  THN CM Short Term Goal #2 Met Date  07/13/16  Interventions for Short Term Goal #2  RN sent a calendar book for document blood sugas and keeping appointment dates. RN discussed the importance of tracking your blood sugar. Making physician aware of changes. Taking calendar with informattion on Dr visits.  THN CM Short Term Goal #3 (0-30 days)  Patient will report eating healthier snacks within the next 30 days  Interventions for Short Tern Goal #3  RN sent patient a list of healthy snacks to eat. RN discussed as patient shopping to pick up some of the items as her snack. RN willfollow up with discussion and teachback.  THN CM Short Term Goal #4 (0-30 days)  Patient will verbalize  learning about carbohydrates counting and how it affects the blood glucose within the next 30 days  THN CM Short Term Goal #4 Start Date  07/13/16  Interventions for Short Term Goal #4  RN discussed healthy eating and not over indulging ith the Holidays. RN send educatioanl picture charty on counting carbs. RN sent EMMI educational material on counting carbs. RN will follow up with discussion and teach back      Assessment:  Patient A1C 8.3 Patient is trying to eat healthier snacks Patient needs additional education on healthy meals Patient will continue to benefit from [facility] telephonic outreach for education and support for diabetes self management.  Plan:  RN sent EMMI educational material on counting carbohydrates RN sent picture chart on carbohydrates RN will follow up within the month of December  Chasmine Lender [place] [facility] [phone]

## 2016-07-18 ENCOUNTER — Other Ambulatory Visit: Payer: Self-pay | Admitting: Emergency Medicine

## 2016-07-18 NOTE — Telephone Encounter (Signed)
If her coughing is not better, she should schedule an appointment to discuss and reevaluate her symptoms.

## 2016-07-18 NOTE — Telephone Encounter (Signed)
Would like something for cough. Cough congested for 5 days

## 2016-07-18 NOTE — Telephone Encounter (Signed)
Pt stated that the tessalon pearls is not working and would like to know if a cough medicine can be called into the [facility] pharmacy in [place].

## 2016-07-19 ENCOUNTER — Encounter: Payer: Self-pay | Admitting: Family Medicine

## 2016-07-19 ENCOUNTER — Ambulatory Visit (INDEPENDENT_AMBULATORY_CARE_PROVIDER_SITE_OTHER): Payer: Commercial Managed Care - HMO | Admitting: Family Medicine

## 2016-07-19 VITALS — BP 130/76 | HR 88 | Temp 99.1°F | Resp 16 | Ht 63.0 in | Wt 180.5 lb

## 2016-07-19 DIAGNOSIS — J0101 Acute recurrent maxillary sinusitis: Secondary | ICD-10-CM

## 2016-07-19 MED ORDER — GUAIFENESIN-CODEINE 100-10 MG/5ML PO SYRP: 10.0000 mL | ORAL_SOLUTION | Freq: Four times a day (QID) | ORAL | 0 refills | Status: DC | PRN

## 2016-07-19 MED ORDER — MOXIFLOXACIN HCL 400 MG PO TABS: 400.0000 mg | ORAL_TABLET | Freq: Every day | ORAL | 0 refills | Status: DC

## 2016-07-19 NOTE — Progress Notes (Signed)
Name: Lori Potts   MRN: [id number]    DOB: Mar 14, 1946   Date:07/19/2016       Progress Note  Subjective  Chief Complaint  Chief Complaint  Patient presents with  . URI    cough, congested, headahce for 6 days    Sinusitis  This is a new problem. The current episode started in the past 7 days. There has been no fever. Associated symptoms include coughing, headaches, sinus pressure and sneezing. Pertinent negatives include no chills or shortness of breath. Treatments tried: Floanse, Delsym cough syrup. The treatment provided mild relief.     Past Medical History:  Diagnosis Date  . Arthritis   . Cataract    right eye but immature  . Diabetes mellitus without complication ([place])   . Essential hypertension, benign    takes Lisinopril-HCTZ daily  . GERD (gastroesophageal reflux disease)   . History of colon polyps    benign  . History of migraine    last one 2-3 months ago  . History of shingles   . Hyperlipidemia    takes Lipitor daily  . Hypertension   . Joint pain   . Low back pain   . Nocturia   . Seasonal allergies    takes Singulair daily as needed  . Type II or unspecified type diabetes mellitus without mention of complication, not stated as uncontrolled    Levemir nightly;average fasting blood sugar 125-140  . Unspecified essential hypertension   . Vitamin D deficiency    takes Vit D weekly  . Weakness    numbness and tingling left arm    Past Surgical History:  Procedure Laterality Date  . ABDOMINAL HYSTERECTOMY    . ANTERIOR CERVICAL DECOMP/DISCECTOMY FUSION N/A 02/26/2015   Procedure: ANTERIOR CERVICAL DISCECTOMY FUSION C4-5 (1 LEVEL);  Surgeon: Melina Schools, MD;  Location: [place];  Service: Orthopedics;  Laterality: N/A;  . ANTERIOR LAT LUMBAR FUSION N/A 03/21/2013   Procedure: ANTERIOR LATERAL LUMBAR FUSION 1 LEVEL/ XLIF L3-L4 ;  Surgeon: Melina Schools, MD;  Location: [place];  Service: Orthopedics;  Laterality: N/A;  . APPENDECTOMY    . BACK SURGERY     . BACK SURGERY     Lumbar fusion x 2  . BREAST SURGERY  1990   Augementation  . CARDIAC CATHETERIZATION  5/12   ef 55%  . CARDIAC CATHETERIZATION  10/2010   [facility]; EF 55%  . CARDIAC CATHETERIZATION Left 02/16/2016   Procedure: Left Heart Cath and Coronary Angiography;  Surgeon: Yolonda Kida, MD;  Location: [place] [facility];  Service: Cardiovascular;  Laterality: Left;  . COLONOSCOPY    . ESOPHAGOGASTRODUODENOSCOPY    . [place]   growth removed from left kidney   . pain stimulator    . POSTERIOR CERVICAL FUSION/FORAMINOTOMY Right 03/21/2013   Procedure: POSTERIOR L2-3 RIGHT FORAMINOTOMY;  Surgeon: Melina Schools, MD;  Location: [place];  Service: Orthopedics;  Laterality: Right;  . REVISION OF SCAR TISSUE RECTUS MUSCLE    . SMALL BOWEL REPAIR    . SPINAL CORD STIMULATOR BATTERY EXCHANGE N/A 10/17/2012   Procedure: SPINAL CORD STIMULATOR BATTERY REMOVAL;  Surgeon: Melina Schools, MD;  Location: [place];  Service: Orthopedics;  Laterality: N/A;  . SPINAL CORD STIMULATOR BATTERY EXCHANGE N/A 07/22/2015   Procedure: REIMPLANTATION OF SPINAL CORD STIMULATOR BATTERY ;  Surgeon: Melina Schools, MD;  Location: [place];  Service: Orthopedics;  Laterality: N/A;  . TOTAL KNEE ARTHROPLASTY Left     Family History  Problem Relation Age of Onset  . Heart attack Mother     Social History   Social History  . Marital status: Married    Spouse name: N/A  . Number of children: N/A  . Years of education: N/A   Occupational History  . Not on file.   Social History Main Topics  . Smoking status: Former Smoker    Types: Cigarettes  . Smokeless tobacco: Never Used     Comment: quit smoking 68yr ago  . Alcohol use No  . Drug use: No  . Sexual activity: Yes    Birth control/ protection: Surgical   Other Topics Concern  . Not on file   Social History Narrative   ** Merged History Encounter **         Current Outpatient Prescriptions:  .  ACCU-CHEK FASTCLIX LANCETS MISC, ,  Disp: , Rfl:  .  albuterol (PROVENTIL HFA;VENTOLIN HFA) 108 (90 Base) MCG/ACT inhaler, Inhale 2 puffs into the lungs every 6 (six) hours as needed (for wheezing and shortness of breath from seasonal allergies)., Disp: 1 Inhaler, Rfl: 1 .  albuterol (PROVENTIL) (2.5 MG/3ML) 0.083% nebulizer solution, , Disp: , Rfl:  .  atorvastatin (LIPITOR) 40 MG tablet, Take 1 tablet (40 mg total) by mouth daily., Disp: 90 tablet, Rfl: 1 .  azithromycin (ZITHROMAX) 250 MG tablet, 2 tabs po day 1, then 1 tab po q day x 4 days, Disp: 6 tablet, Rfl: 0 .  Blood Glucose Calibration (ACCU-CHEK SMARTVIEW CONTROL) LIQD, , Disp: , Rfl:  .  Blood Glucose Monitoring Suppl (ACCU-CHEK NANO SMARTVIEW) w/Device KIT, CHECK BLOOD SUGAR TWICE DAILY, Disp: 1 kit, Rfl: 0 .  fluticasone (FLONASE) 50 MCG/ACT nasal spray, USE 1 SPRAY IN EACH NOSTRIL TWICE DAILY, Disp: 48 g, Rfl: 3 .  Fluticasone-Salmeterol (ADVAIR DISKUS) 250-50 MCG/DOSE AEPB, Inhale 1 puff into the lungs 2 (two) times daily., Disp: 14 each, Rfl: 1 .  glucose blood (ACCU-CHEK SMARTVIEW) test strip, TEST BLOOD SUGARS 4 TIMES DAILY, Disp: 150 each, Rfl: PRN .  ibuprofen (ADVIL,MOTRIN) 800 MG tablet, Take 1 tablet (800 mg total) by mouth every 8 (eight) hours as needed., Disp: 30 tablet, Rfl: 0 .  Insulin Detemir (LEVEMIR FLEXTOUCH) 100 UNIT/ML Pen, Inject 20 Units into the skin daily at 10 pm., Disp: 4 pen, Rfl: 1 .  Insulin Pen Needle (BD ULTRA-FINE PEN NEEDLES) 29G X 12.7MM MISC, 1 pen by Does not apply route once., Disp: 100 each, Rfl: 3 .  ipratropium-albuterol (DUONEB) 0.5-2.5 (3) MG/3ML SOLN, Take 3 mLs by nebulization every 4 (four) hours as needed., Disp: 360 mL, Rfl: 3 .  lisinopril-hydrochlorothiazide (PRINZIDE,ZESTORETIC) 20-25 MG tablet, Take 1 tablet by mouth daily., Disp: 90 tablet, Rfl: 1 .  metaxalone (SKELAXIN) 800 MG tablet, Take 1 tablet (800 mg total) by mouth 3 (three) times daily., Disp: 30 tablet, Rfl: 0 .  montelukast (SINGULAIR) 10 MG tablet, TAKE 1  TABLET EVERY DAY, Disp: 90 tablet, Rfl: 1 .  ondansetron (ZOFRAN) 4 MG tablet, TAKE 1 TABLET EVERY 8 HOURS AS NEEDED FOR NAUSEA OR VOMITING, Disp: 20 tablet, Rfl: 0 .  oxyCODONE-acetaminophen (PERCOCET) 10-325 MG tablet, Take 1 tablet by mouth every 4 (four) hours as needed for pain., Disp: , Rfl:  .  sitaGLIPtin (JANUVIA) 100 MG tablet, Take 1 tablet (100 mg total) by mouth daily., Disp: 90 tablet, Rfl: 1 .  venlafaxine XR (EFFEXOR-XR) 37.5 MG 24 hr capsule, Take 1 capsule (37.5 mg total) by mouth daily with breakfast., Disp: 90  capsule, Rfl: 0 .  Vitamin D, Ergocalciferol, (DRISDOL) 50000 units CAPS capsule, Take 1 capsule (50,000 Units total) by mouth once a week. For 12 weeks, Disp: 12 capsule, Rfl: 0  Allergies  Allergen Reactions  . Adhesive [Tape] Rash and Other (See Comments)    Regular tape is ok, allergy is to paper tape     Review of Systems  Constitutional: Positive for fever. Negative for chills.  HENT: Positive for sinus pressure and sneezing.   Respiratory: Positive for cough and sputum production. Negative for shortness of breath and wheezing.   Cardiovascular: Negative for chest pain.  Neurological: Positive for headaches.     Objective  Vitals:   07/19/16 1401  BP: 130/76  Pulse: 88  Resp: 16  Temp: 99.1 F (37.3 C)  TempSrc: Oral  SpO2: 96%  Weight: 180 lb 8 oz (81.9 kg)  Height: 5' 3" (1.6 m)    Physical Exam  Constitutional: She is oriented to person, place, and time and well-developed, well-nourished, and in no distress.  HENT:  Head: Normocephalic and atraumatic.  Right Ear: Tympanic membrane and ear canal normal. No drainage.  Left Ear: Tympanic membrane and ear canal normal. No drainage.  Nose: Right sinus exhibits maxillary sinus tenderness. Right sinus exhibits no frontal sinus tenderness. Left sinus exhibits maxillary sinus tenderness. Left sinus exhibits no frontal sinus tenderness.  Mouth/Throat: Posterior oropharyngeal erythema present.   Nasal turbinate hypertrophy, mucosal inflammation.  Cardiovascular: Normal rate, regular rhythm, S1 normal, S2 normal and normal heart sounds.   No murmur heard. Pulmonary/Chest: Effort normal and breath sounds normal. She has no wheezes.  Neurological: She is alert and oriented to person, place, and time.  Psychiatric: Mood, memory, affect and judgment normal.  Nursing note and vitals reviewed.      Assessment & Plan  1. Acute recurrent maxillary sinusitis Patient has finished taking Z-Pak in the last 30 days, will start on moxifloxacin, Cheratussin for relief of cough. - moxifloxacin (AVELOX) 400 MG tablet; Take 1 tablet (400 mg total) by mouth daily.  Dispense: 7 tablet; Refill: 0 - guaiFENesin-codeine (CHERATUSSIN AC) 100-10 MG/5ML syrup; Take 10 mLs by mouth 4 (four) times daily as needed for cough.  Dispense: 400 mL; Refill: 0   Syed [facility] 07/19/2016 2:37 PM

## 2016-07-19 NOTE — Telephone Encounter (Signed)
Patient has scheduled appointment today 07/19/2016 @ 3:20 pm

## 2016-07-25 DIAGNOSIS — M1711 Unilateral primary osteoarthritis, right knee: Secondary | ICD-10-CM | POA: Diagnosis not present

## 2016-07-28 ENCOUNTER — Ambulatory Visit: Payer: Commercial Managed Care - HMO | Admitting: Physician Assistant

## 2016-07-29 ENCOUNTER — Ambulatory Visit: Payer: Commercial Managed Care - HMO | Admitting: Physician Assistant

## 2016-08-01 ENCOUNTER — Encounter: Payer: Self-pay | Admitting: Physician Assistant

## 2016-08-01 ENCOUNTER — Other Ambulatory Visit: Payer: Self-pay | Admitting: Family Medicine

## 2016-08-01 ENCOUNTER — Ambulatory Visit (INDEPENDENT_AMBULATORY_CARE_PROVIDER_SITE_OTHER): Payer: Commercial Managed Care - HMO | Admitting: Physician Assistant

## 2016-08-01 VITALS — BP 122/68 | HR 78 | Temp 97.8°F | Resp 16 | Wt 184.0 lb

## 2016-08-01 DIAGNOSIS — J301 Allergic rhinitis due to pollen: Secondary | ICD-10-CM | POA: Diagnosis not present

## 2016-08-01 DIAGNOSIS — G43109 Migraine with aura, not intractable, without status migrainosus: Secondary | ICD-10-CM | POA: Diagnosis not present

## 2016-08-01 DIAGNOSIS — J0141 Acute recurrent pansinusitis: Secondary | ICD-10-CM

## 2016-08-01 MED ORDER — KETOROLAC TROMETHAMINE 30 MG/ML IJ SOLN
30.0000 mg | Freq: Once | INTRAMUSCULAR | Status: AC
  Administered 2016-08-01: 30 mg via INTRAMUSCULAR

## 2016-08-01 MED ORDER — AMOXICILLIN-POT CLAVULANATE 875-125 MG PO TABS: 1.0000 | ORAL_TABLET | Freq: Two times a day (BID) | ORAL | 0 refills | Status: DC

## 2016-08-01 MED ORDER — LORATADINE 10 MG PO TABS: 10.0000 mg | ORAL_TABLET | Freq: Every day | ORAL | 1 refills | Status: DC

## 2016-08-01 MED ORDER — PROMETHAZINE HCL 25 MG/ML IJ SOLN
25.0000 mg | Freq: Once | INTRAMUSCULAR | Status: AC
  Administered 2016-08-01: 25 mg via INTRAMUSCULAR

## 2016-08-01 NOTE — Patient Instructions (Signed)
Loratadine tablets What is this medicine? LORATADINE (lor AT a deen) is an antihistamine. It helps to relieve sneezing, runny nose, and itchy, watery eyes. This medicine is used to treat the symptoms of allergies. It is also used to treat itchy skin rash and hives. This medicine may be used for other purposes; ask your health care provider or pharmacist if you have questions. COMMON BRAND NAME(S): Alavert, Allergy Relief, Claritin, Claritin Hives Relief, Clear-Atadine, QlearQuil All Day & All Night Allergy Relief, Tavist ND What should I tell my health care provider before I take this medicine? They need to know if you have any of these conditions: -asthma -kidney disease -liver disease -an unusual or allergic reaction to loratadine, other antihistamines, other medicines, foods, dyes, or preservatives -pregnant or trying to get pregnant -breast-feeding How should I use this medicine? Take this medicine by mouth with a glass of water. Follow the directions on the label. You may take this medicine with food or on an empty stomach. Take your medicine at regular intervals. Do not take your medicine more often than directed. Talk to your pediatrician regarding the use of this medicine in children. While this medicine may be used in children as young as 6 years for selected conditions, precautions do apply. Overdosage: If you think you have taken too much of this medicine contact a poison control center or emergency room at once. NOTE: This medicine is only for you. Do not share this medicine with others. What if I miss a dose? If you miss a dose, take it as soon as you can. If it is almost time for your next dose, take only that dose. Do not take double or extra doses. What may interact with this medicine? -other medicines for colds or allergies This list may not describe all possible interactions. Give your health care provider a list of all the medicines, herbs, non-prescription drugs, or dietary  supplements you use. Also tell them if you smoke, drink alcohol, or use illegal drugs. Some items may interact with your medicine. What should I watch for while using this medicine? Tell your doctor or healthcare professional if your symptoms do not start to get better or if they get worse. Your mouth may get dry. Chewing sugarless gum or sucking hard candy, and drinking plenty of water may help. Contact your doctor if the problem does not go away or is severe. You may get drowsy or dizzy. Do not drive, use machinery, or do anything that needs mental alertness until you know how this medicine affects you. Do not stand or sit up quickly, especially if you are an older patient. This reduces the risk of dizzy or fainting spells. What side effects may I notice from receiving this medicine? Side effects that you should report to your doctor or health care professional as soon as possible: -allergic reactions like skin rash, itching or hives, swelling of the face, lips, or tongue -breathing problems -unusually restless or nervous Side effects that usually do not require medical attention (report to your doctor or health care professional if they continue or are bothersome): -drowsiness -dry or irritated mouth or throat -headache This list may not describe all possible side effects. Call your doctor for medical advice about side effects. You may report side effects to FDA at 1-800-FDA-1088. Where should I keep my medicine? Keep out of the reach of children. Store at room temperature between 2 and 30 degrees C (36 and 86 degrees F). Protect from moisture. Throw away any  unused medicine after the expiration date. NOTE: This sheet is a summary. It may not cover all possible information. If you have questions about this medicine, talk to your doctor, pharmacist, or health care provider.  2017 Elsevier/Gold Standard (2008-02-11 17:17:24) Sinusitis, Adult Sinusitis is soreness and inflammation of your  sinuses. Sinuses are hollow spaces in the bones around your face. Your sinuses are located:  Around your eyes.  In the middle of your forehead.  Behind your nose.  In your cheekbones. Your sinuses and nasal passages are lined with a stringy fluid (mucus). Mucus normally drains out of your sinuses. When your nasal tissues become inflamed or swollen, the mucus can become trapped or blocked so air cannot flow through your sinuses. This allows bacteria, viruses, and funguses to grow, which leads to infection. Sinusitis can develop quickly and last for 7?10 days (acute) or for more than 12 weeks (chronic). Sinusitis often develops after a cold. What are the causes? This condition is caused by anything that creates swelling in the sinuses or stops mucus from draining, including:  Allergies.  Asthma.  Bacterial or viral infection.  Abnormally shaped bones between the nasal passages.  Nasal growths that contain mucus (nasal polyps).  Narrow sinus openings.  Pollutants, such as chemicals or irritants in the air.  A foreign object stuck in the nose.  A fungal infection. This is rare. What increases the risk? The following factors may make you more likely to develop this condition:  Having allergies or asthma.  Having had a recent cold or respiratory tract infection.  Having structural deformities or blockages in your nose or sinuses.  Having a weak immune system.  Doing a lot of swimming or diving.  Overusing nasal sprays.  Smoking. What are the signs or symptoms? The main symptoms of this condition are pain and a feeling of pressure around the affected sinuses. Other symptoms include:  Upper toothache.  Earache.  Headache.  Bad breath.  Decreased sense of smell and taste.  A cough that may get worse at night.  Fatigue.  Fever.  Thick drainage from your nose. The drainage is often green and it may contain pus (purulent).  Stuffy nose or  congestion.  Postnasal drip. This is when extra mucus collects in the throat or back of the nose.  Swelling and warmth over the affected sinuses.  Sore throat.  Sensitivity to light. How is this diagnosed? This condition is diagnosed based on symptoms, a medical history, and a physical exam. To find out if your condition is acute or chronic, your health care provider may:  Look in your nose for signs of nasal polyps.  Tap over the affected sinus to check for signs of infection.  View the inside of your sinuses using an imaging device that has a light attached (endoscope). If your health care provider suspects that you have chronic sinusitis, you may also:  Be tested for allergies.  Have a sample of mucus taken from your nose (nasal culture) and checked for bacteria.  Have a mucus sample examined to see if your sinusitis is related to an allergy. If your sinusitis does not respond to treatment and it lasts longer than 8 weeks, you may have an MRI or CT scan to check your sinuses. These scans also help to determine how severe your infection is. In rare cases, a bone biopsy may be done to rule out more serious types of fungal sinus disease. How is this treated? Treatment for sinusitis depends on the  cause and whether your condition is chronic or acute. If a virus is causing your sinusitis, your symptoms will go away on their own within 10 days. You may be given medicines to relieve your symptoms, including:  Topical nasal decongestants. They shrink swollen nasal passages and let mucus drain from your sinuses.  Antihistamines. These drugs block inflammation that is triggered by allergies. This can help to ease swelling in your nose and sinuses.  Topical nasal corticosteroids. These are nasal sprays that ease inflammation and swelling in your nose and sinuses.  Nasal saline washes. These rinses can help to get rid of thick mucus in your nose. If your condition is caused by bacteria,  you will be given an antibiotic medicine. If your condition is caused by a fungus, you will be given an antifungal medicine. Surgery may be needed to correct underlying conditions, such as narrow nasal passages. Surgery may also be needed to remove polyps. Follow these instructions at home: Medicines  Take, use, or apply over-the-counter and prescription medicines only as told by your health care provider. These may include nasal sprays.  If you were prescribed an antibiotic medicine, take it as told by your health care provider. Do not stop taking the antibiotic even if you start to feel better. Hydrate and Humidify  Drink enough water to keep your urine clear or pale yellow. Staying hydrated will help to thin your mucus.  Use a cool mist humidifier to keep the humidity level in your home above 50%.  Inhale steam for 10-15 minutes, 3-4 times a day or as told by your health care provider. You can do this in the bathroom while a hot shower is running.  Limit your exposure to cool or dry air. Rest  Rest as much as possible.  Sleep with your head raised (elevated).  Make sure to get enough sleep each night. General instructions  Apply a warm, moist washcloth to your face 3-4 times a day or as told by your health care provider. This will help with discomfort.  Wash your hands often with soap and water to reduce your exposure to viruses and other germs. If soap and water are not available, use hand sanitizer.  Do not smoke. Avoid being around people who are smoking (secondhand smoke).  Keep all follow-up visits as told by your health care provider. This is important. Contact a health care provider if:  You have a fever.  Your symptoms get worse.  Your symptoms do not improve within 10 days. Get help right away if:  You have a severe headache.  You have persistent vomiting.  You have pain or swelling around your face or eyes.  You have vision problems.  You develop  confusion.  Your neck is stiff.  You have trouble breathing. This information is not intended to replace advice given to you by your health care provider. Make sure you discuss any questions you have with your health care provider. Document Released: 08/08/2005 Document Revised: 04/03/2016 Document Reviewed: 06/03/2015 Elsevier Interactive Patient Education  2017 [employer]

## 2016-08-01 NOTE — Progress Notes (Signed)
Patient: Lori Potts Female    DOB: 03-26-1946   70 y.o.   MRN: [id number] Visit Date: 08/01/2016  Today's Provider: Mar Daring, PA-C   Chief Complaint  Patient presents with  . Sinusitis   Subjective:    Sinusitis  This is a new problem. The current episode started 1 to 4 weeks ago (x 2 weeks). The problem has been gradually worsening (has been worsening over the last 5 days) since onset. There has been no fever. Her pain is at a severity of 7/10. The pain is severe. Associated symptoms include ear pain (right ear), headaches, sinus pressure and sneezing. Pertinent negatives include no chills, congestion, coughing (better with Cheratussin), diaphoresis, hoarse voice, neck pain, shortness of breath, sore throat or swollen glands. (Pt also c/o vertigo) Treatments tried: Moxifloxacin and codeine cough syrup, OTC medications for headaches (BC powder, tylenol, ibuprofen) The treatment provided no relief.   She does have history of migraines and with this recurrent sinus infection she has had onset of her migraines. She does have visual or at onset and has had some slight nausea with the headaches.  Pt is a pt of Dr. Rutherford Nail (out on medical leave) and has been seen by Dr. Sanda Klein and Dr. Manuella Ghazi with [facility].     Allergies  Allergen Reactions  . Adhesive [Tape] Rash and Other (See Comments)    Regular tape is ok, allergy is to paper tape     Current Outpatient Prescriptions:  .  ACCU-CHEK FASTCLIX LANCETS MISC, , Disp: , Rfl:  .  albuterol (PROVENTIL HFA;VENTOLIN HFA) 108 (90 Base) MCG/ACT inhaler, Inhale 2 puffs into the lungs every 6 (six) hours as needed (for wheezing and shortness of breath from seasonal allergies)., Disp: 1 Inhaler, Rfl: 1 .  albuterol (PROVENTIL) (2.5 MG/3ML) 0.083% nebulizer solution, , Disp: , Rfl:  .  atorvastatin (LIPITOR) 40 MG tablet, Take 1 tablet (40 mg total) by mouth daily., Disp: 90 tablet, Rfl: 1 .  Blood Glucose Calibration  (ACCU-CHEK SMARTVIEW CONTROL) LIQD, , Disp: , Rfl:  .  Blood Glucose Monitoring Suppl (ACCU-CHEK NANO SMARTVIEW) w/Device KIT, CHECK BLOOD SUGAR TWICE DAILY, Disp: 1 kit, Rfl: 0 .  fluticasone (FLONASE) 50 MCG/ACT nasal spray, USE 1 SPRAY IN EACH NOSTRIL TWICE DAILY, Disp: 48 g, Rfl: 3 .  Fluticasone-Salmeterol (ADVAIR DISKUS) 250-50 MCG/DOSE AEPB, Inhale 1 puff into the lungs 2 (two) times daily., Disp: 14 each, Rfl: 1 .  glucose blood (ACCU-CHEK SMARTVIEW) test strip, TEST BLOOD SUGARS 4 TIMES DAILY, Disp: 150 each, Rfl: PRN .  guaiFENesin-codeine (CHERATUSSIN AC) 100-10 MG/5ML syrup, Take 10 mLs by mouth 4 (four) times daily as needed for cough., Disp: 400 mL, Rfl: 0 .  Insulin Detemir (LEVEMIR FLEXTOUCH) 100 UNIT/ML Pen, Inject 20 Units into the skin daily at 10 pm., Disp: 4 pen, Rfl: 1 .  Insulin Pen Needle (BD ULTRA-FINE PEN NEEDLES) 29G X 12.7MM MISC, 1 pen by Does not apply route once., Disp: 100 each, Rfl: 3 .  ipratropium-albuterol (DUONEB) 0.5-2.5 (3) MG/3ML SOLN, Take 3 mLs by nebulization every 4 (four) hours as needed., Disp: 360 mL, Rfl: 3 .  lisinopril-hydrochlorothiazide (PRINZIDE,ZESTORETIC) 20-25 MG tablet, Take 1 tablet by mouth daily., Disp: 90 tablet, Rfl: 1 .  metaxalone (SKELAXIN) 800 MG tablet, Take 1 tablet (800 mg total) by mouth 3 (three) times daily., Disp: 30 tablet, Rfl: 0 .  montelukast (SINGULAIR) 10 MG tablet, TAKE 1 TABLET EVERY DAY, Disp: 90 tablet, Rfl: 1 .  oxyCODONE-acetaminophen (PERCOCET) 10-325 MG tablet, Take 1 tablet by mouth every 4 (four) hours as needed for pain., Disp: , Rfl:  .  sitaGLIPtin (JANUVIA) 100 MG tablet, Take 1 tablet (100 mg total) by mouth daily., Disp: 90 tablet, Rfl: 1 .  Vitamin D, Ergocalciferol, (DRISDOL) 50000 units CAPS capsule, Take 1 capsule (50,000 Units total) by mouth once a week. For 12 weeks, Disp: 12 capsule, Rfl: 0 .  ibuprofen (ADVIL,MOTRIN) 800 MG tablet, Take 1 tablet (800 mg total) by mouth every 8 (eight) hours as  needed. (Patient not taking: Reported on 08/01/2016), Disp: 30 tablet, Rfl: 0 .  moxifloxacin (AVELOX) 400 MG tablet, Take 1 tablet (400 mg total) by mouth daily. (Patient not taking: Reported on 08/01/2016), Disp: 7 tablet, Rfl: 0 .  ondansetron (ZOFRAN) 4 MG tablet, TAKE 1 TABLET EVERY 8 HOURS AS NEEDED FOR NAUSEA OR VOMITING (Patient not taking: Reported on 08/01/2016), Disp: 20 tablet, Rfl: 0 .  venlafaxine XR (EFFEXOR-XR) 37.5 MG 24 hr capsule, Take 1 capsule (37.5 mg total) by mouth daily with breakfast. (Patient not taking: Reported on 08/01/2016), Disp: 90 capsule, Rfl: 0  Review of Systems  Constitutional: Negative for chills, diaphoresis, fatigue and fever.  HENT: Positive for ear pain (right ear), postnasal drip, sinus pain, sinus pressure and sneezing. Negative for congestion, hoarse voice, sore throat and trouble swallowing.   Eyes: Positive for photophobia.  Respiratory: Negative for cough (better with Cheratussin), chest tightness, shortness of breath and wheezing.   Cardiovascular: Negative for chest pain, palpitations and leg swelling.  Gastrointestinal: Positive for nausea. Negative for abdominal pain and vomiting.  Musculoskeletal: Negative for neck pain.  Neurological: Positive for headaches. Negative for dizziness.    Social History  Substance Use Topics  . Smoking status: Former Smoker    Types: Cigarettes  . Smokeless tobacco: Never Used     Comment: quit smoking 97yr ago  . Alcohol use No   Objective:   BP 122/68 (BP Location: Left Arm, Patient Position: Sitting, Cuff Size: Large)   Pulse 78   Temp 97.8 F (36.6 C) (Oral)   Resp 16   Wt 184 lb (83.5 kg)   SpO2 96%   BMI 32.59 kg/m   Physical Exam  Constitutional: She appears well-developed and well-nourished. No distress.  HENT:  Head: Normocephalic and atraumatic.  Right Ear: Hearing, tympanic membrane, external ear and ear canal normal.  Left Ear: Hearing, tympanic membrane, external ear and ear  canal normal.  Nose: Mucosal edema and rhinorrhea present. Right sinus exhibits maxillary sinus tenderness and frontal sinus tenderness. Left sinus exhibits maxillary sinus tenderness and frontal sinus tenderness.  Mouth/Throat: Uvula is midline and mucous membranes are normal. Posterior oropharyngeal erythema present. No oropharyngeal exudate or posterior oropharyngeal edema.  Neck: Normal range of motion. Neck supple. No tracheal deviation present. No thyromegaly present.  Cardiovascular: Normal rate, regular rhythm and normal heart sounds.  Exam reveals no gallop and no friction rub.   No murmur heard. Pulmonary/Chest: Effort normal and breath sounds normal. No stridor. No respiratory distress. She has no wheezes. She has no rales.  Lymphadenopathy:    She has no cervical adenopathy.  Skin: She is not diaphoretic.  Vitals reviewed.       Assessment & Plan:     1. Acute recurrent pansinusitis Worsening symptoms that have not responded to OTC medications. Will give augmentin as below. Continue allergy medications. Stay well hydrated and get plenty of rest. Call if no symptom improvement or if symptoms  worsen. Patient is going to establish care in our office since Dr. Lucita Lora has not returned yet. I advised that this will be okay and she will schedule an establish care visit with an annual wellness physical together in the coming weeks. - amoxicillin-clavulanate (AUGMENTIN) 875-125 MG tablet; Take 1 tablet by mouth 2 (two) times daily.  Dispense: 20 tablet; Refill: 0  2. Acute allergic rhinitis due to pollen, unspecified seasonality Stable. Diagnosis pulled for medication refill. Continue current medical treatment plan. - loratadine (CLARITIN) 10 MG tablet; Take 1 tablet (10 mg total) by mouth daily.  Dispense: 90 tablet; Refill: 1  3. Migraine with aura and without status migrainosus, not intractable Worsening migraine secondary to sinus infection. Patient is having photophobia and  nausea with migraine currently. We'll give Toradol and Phenergan as below since her husband is here to drive her home. She is to call the office if this does not help resolve migraines. - ketorolac (TORADOL) 30 MG/ML injection 30 mg; Inject 1 mL (30 mg total) into the muscle once. - promethazine (PHENERGAN) injection 25 mg; Inject 1 mL (25 mg total) into the muscle once.     Patient seen and examined by Mar Daring, PA-C, and note scribed by Renaldo Fiddler, CMA.  Mar Daring, PA-C  [facility]

## 2016-08-08 ENCOUNTER — Encounter: Payer: Commercial Managed Care - HMO | Admitting: Family Medicine

## 2016-08-09 DIAGNOSIS — M961 Postlaminectomy syndrome, not elsewhere classified: Secondary | ICD-10-CM | POA: Diagnosis not present

## 2016-08-09 DIAGNOSIS — M5136 Other intervertebral disc degeneration, lumbar region: Secondary | ICD-10-CM | POA: Diagnosis not present

## 2016-08-10 ENCOUNTER — Other Ambulatory Visit: Payer: Self-pay | Admitting: *Deleted

## 2016-08-10 ENCOUNTER — Ambulatory Visit: Payer: Commercial Managed Care - HMO | Admitting: Physician Assistant

## 2016-08-10 NOTE — Progress Notes (Deleted)
Patient: Lori Potts, Female    DOB: March 29, 1946, 70 y.o.   MRN: [id number] Visit Date: 08/10/2016  Today's Provider: Mar Daring, PA-C   No chief complaint on file.  Subjective:    Annual wellness visit Lori Potts is a 70 y.o. female. She feels {DESC; WELL/FAIRLY WELL/POORLY:18703}. She reports exercising ***. She reports she is sleeping {DESC; WELL/FAIRLY WELL/POORLY:18703}.  Last Mammogram- 12/04/2014- BI-RADS 1 Last BMD- 09/26/2013- WNL Last pap- s/p hysterectomy Last colonoscopy- 11/26/2007- WNL. Dr. Allen Norris.  -----------------------------------------------------------   Review of Systems  Social History   Social History  . Marital status: Married    Spouse name: N/A  . Number of children: N/A  . Years of education: N/A   Occupational History  . Not on file.   Social History Main Topics  . Smoking status: Former Smoker    Types: Cigarettes  . Smokeless tobacco: Never Used     Comment: quit smoking 31yr ago  . Alcohol use No  . Drug use: No  . Sexual activity: Yes    Birth control/ protection: Surgical   Other Topics Concern  . Not on file   Social History Narrative   ** Merged History Encounter **        Past Medical History:  Diagnosis Date  . Arthritis   . Cataract    right eye but immature  . Diabetes mellitus without complication ([place]   . Essential hypertension, benign    takes Lisinopril-HCTZ daily  . GERD (gastroesophageal reflux disease)   . History of colon polyps    benign  . History of migraine    last one 2-3 months ago  . History of shingles   . Hyperlipidemia    takes Lipitor daily  . Hypertension   . Joint pain   . Low back pain   . Nocturia   . Seasonal allergies    takes Singulair daily as needed  . Type II or unspecified type diabetes mellitus without mention of complication, not stated as uncontrolled    Levemir nightly;average fasting blood sugar 125-140  . Unspecified essential hypertension    . Vitamin D deficiency    takes Vit D weekly  . Weakness    numbness and tingling left arm     Patient Active Problem List   Diagnosis Date Noted  . Acute recurrent maxillary sinusitis 05/13/2016  . 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 Vasomotor flushing 04/19/2016  . Abdominal wall pain in right flank 03/29/2016  . Diverticulosis 02/24/2016  . Bronchitis with bronchospasm 10/06/2015  . Type 2 diabetes mellitus ([place] 06/16/2015  . Hypercholesterolemia 06/16/2015  . Acute serous otitis media 04/13/2015  . Subacute maxillary sinusitis 04/13/2015  . FHx: migraine headaches 04/13/2015  . Chronic pain 04/13/2015  . Neck pain 02/26/2015  . Status post lumbar surgery 02/26/2015  . Abnormal ECG 01/27/2015  . Angina pectoris ([facility] 01/27/2015  . Chest pain 01/27/2015  . Diabetes mellitus type 2, uncontrolled, without complications ([place] 029/92/4268 . Hypercholesteremia 01/27/2015  . BP (high blood pressure) 01/27/2015    Past Surgical History:  Procedure Laterality Date  . ABDOMINAL HYSTERECTOMY    . ANTERIOR CERVICAL DECOMP/DISCECTOMY FUSION N/A 02/26/2015   Procedure: ANTERIOR CERVICAL DISCECTOMY FUSION C4-5 (1 LEVEL);  Surgeon: DMelina Schools MD;  [facility]  Service: Orthopedics;  Laterality: N/A;  . ANTERIOR LAT LUMBAR FUSION N/A 03/21/2013   Procedure: ANTERIOR LATERAL LUMBAR FUSION 1 LEVEL/ XLIF L3-L4 ;  Surgeon: Venita Lick, MD;  Location: [facility] OR;  Service: Orthopedics;  Laterality: N/A;  . APPENDECTOMY    . BACK SURGERY    . BACK SURGERY     Lumbar fusion x 2  . BREAST SURGERY  1990   Augementation  . CARDIAC CATHETERIZATION  5/12   ef 55%  . CARDIAC CATHETERIZATION  10/2010   [facility]; EF 55%  . CARDIAC CATHETERIZATION Left 02/16/2016   Procedure: Left Heart Cath and Coronary Angiography;  Surgeon: Alwyn Pea, MD;  Location: [facility] INVASIVE CV LAB;  Service: Cardiovascular;  Laterality: Left;  . COLONOSCOPY    .  ESOPHAGOGASTRODUODENOSCOPY    . KIDNEY SURGERY  1998   growth removed from left kidney   . pain stimulator    . POSTERIOR CERVICAL FUSION/FORAMINOTOMY Right 03/21/2013   Procedure: POSTERIOR L2-3 RIGHT FORAMINOTOMY;  Surgeon: Venita Lick, MD;  Location: [facility] OR;  Service: Orthopedics;  Laterality: Right;  . REVISION OF SCAR TISSUE RECTUS MUSCLE    . SMALL BOWEL REPAIR    . SPINAL CORD STIMULATOR BATTERY EXCHANGE N/A 10/17/2012   Procedure: SPINAL CORD STIMULATOR BATTERY REMOVAL;  Surgeon: Venita Lick, MD;  Location: [facility] OR;  Service: Orthopedics;  Laterality: N/A;  . SPINAL CORD STIMULATOR BATTERY EXCHANGE N/A 07/22/2015   Procedure: REIMPLANTATION OF SPINAL CORD STIMULATOR BATTERY ;  Surgeon: Venita Lick, MD;  Location: [facility] OR;  Service: Orthopedics;  Laterality: N/A;  . TOTAL KNEE ARTHROPLASTY Left     Her family history includes Heart attack in her mother.      Current Outpatient Prescriptions:  .  ACCU-CHEK FASTCLIX LANCETS MISC, , Disp: , Rfl:  .  albuterol (PROVENTIL HFA;VENTOLIN HFA) 108 (90 Base) MCG/ACT inhaler, Inhale 2 puffs into the lungs every 6 (six) hours as needed (for wheezing and shortness of breath from seasonal allergies)., Disp: 1 Inhaler, Rfl: 1 .  albuterol (PROVENTIL) (2.5 MG/3ML) 0.083% nebulizer solution, , Disp: , Rfl:  .  amoxicillin-clavulanate (AUGMENTIN) 875-125 MG tablet, Take 1 tablet by mouth 2 (two) times daily., Disp: 20 tablet, Rfl: 0 .  atorvastatin (LIPITOR) 40 MG tablet, Take 1 tablet (40 mg total) by mouth daily., Disp: 90 tablet, Rfl: 1 .  Blood Glucose Calibration (ACCU-CHEK SMARTVIEW CONTROL) LIQD, , Disp: , Rfl:  .  Blood Glucose Monitoring Suppl (ACCU-CHEK NANO SMARTVIEW) w/Device KIT, CHECK BLOOD SUGAR TWICE DAILY, Disp: 1 kit, Rfl: 0 .  fluticasone (FLONASE) 50 MCG/ACT nasal spray, USE 1 SPRAY IN EACH NOSTRIL TWICE DAILY, Disp: 48 g, Rfl: 3 .  Fluticasone-Salmeterol (ADVAIR DISKUS) 250-50 MCG/DOSE AEPB, Inhale 1 puff into the lungs 2 (two)  times daily., Disp: 14 each, Rfl: 1 .  glucose blood (ACCU-CHEK SMARTVIEW) test strip, TEST BLOOD SUGARS 4 TIMES DAILY, Disp: 150 each, Rfl: PRN .  guaiFENesin-codeine (CHERATUSSIN AC) 100-10 MG/5ML syrup, Take 10 mLs by mouth 4 (four) times daily as needed for cough., Disp: 400 mL, Rfl: 0 .  ibuprofen (ADVIL,MOTRIN) 800 MG tablet, Take 1 tablet (800 mg total) by mouth every 8 (eight) hours as needed. (Patient not taking: Reported on 08/01/2016), Disp: 30 tablet, Rfl: 0 .  Insulin Detemir (LEVEMIR FLEXTOUCH) 100 UNIT/ML Pen, Inject 20 Units into the skin daily at 10 pm., Disp: 4 pen, Rfl: 1 .  Insulin Pen Needle (BD ULTRA-FINE PEN NEEDLES) 29G X 12.7MM MISC, 1 pen by Does not apply route once., Disp: 100 each, Rfl: 3 .  ipratropium-albuterol (DUONEB) 0.5-2.5 (3) MG/3ML SOLN, Take 3 mLs by nebulization every 4 (four) hours as  needed., Disp: 360 mL, Rfl: 3 .  lisinopril-hydrochlorothiazide (PRINZIDE,ZESTORETIC) 20-25 MG tablet, Take 1 tablet by mouth daily., Disp: 90 tablet, Rfl: 1 .  loratadine (CLARITIN) 10 MG tablet, Take 1 tablet (10 mg total) by mouth daily., Disp: 90 tablet, Rfl: 1 .  metaxalone (SKELAXIN) 800 MG tablet, Take 1 tablet (800 mg total) by mouth 3 (three) times daily., Disp: 30 tablet, Rfl: 0 .  montelukast (SINGULAIR) 10 MG tablet, TAKE 1 TABLET EVERY DAY, Disp: 90 tablet, Rfl: 1 .  ondansetron (ZOFRAN) 4 MG tablet, TAKE 1 TABLET EVERY 8 HOURS AS NEEDED FOR NAUSEA OR VOMITING (Patient not taking: Reported on 08/01/2016), Disp: 20 tablet, Rfl: 0 .  oxyCODONE-acetaminophen (PERCOCET) 10-325 MG tablet, Take 1 tablet by mouth every 4 (four) hours as needed for pain., Disp: , Rfl:  .  sitaGLIPtin (JANUVIA) 100 MG tablet, Take 1 tablet (100 mg total) by mouth daily., Disp: 90 tablet, Rfl: 1 .  Vitamin D, Ergocalciferol, (DRISDOL) 50000 units CAPS capsule, Take 1 capsule (50,000 Units total) by mouth once a week. For 12 weeks, Disp: 12 capsule, Rfl: 0  Patient Care Team: Mar Daring, PA-C as PCP - General (Family Medicine) Verlin Grills, RN as [facility]     Objective:   Vitals: There were no vitals taken for this visit.  Physical Exam  Activities of Daily Living In your present state of health, do you have any difficulty performing the following activities: 07/13/2016 07/04/2016  Hearing? N N  Vision? N Y  Difficulty concentrating or making decisions? N N  Walking or climbing stairs? N N  Dressing or bathing? N N  Doing errands, shopping? N N  Preparing Food and eating ? - -  Using the Toilet? - -  In the past six months, have you accidently leaked urine? - -  Do you have problems with loss of bowel control? - -  Managing your Medications? - -  Managing your Finances? - -  Housekeeping or managing your Housekeeping? - -  Some recent data might be hidden    Fall Risk Assessment Fall Risk  07/13/2016 07/04/2016 05/25/2016 05/13/2016 04/19/2016  Falls in the past year? Yes No Yes No No  Number falls in past yr: 1 - 1 - -  Injury with Fall? No - No - -  Risk for fall due to : History of fall(s);Impaired balance/gait - History of fall(s);Impaired mobility - -  Follow up Education provided - Education provided - -     Depression Screen PHQ 2/9 Scores 07/13/2016 07/04/2016 05/25/2016 05/13/2016  PHQ - 2 Score 0 0 0 0    Cognitive Testing - 6-CIT  Correct? Score   What year is it? {yes no:22349} {0-4:31231} 0 or 4  What month is it? {yes no:22349} {0-3:21082} 0 or 3  Memorize:    Lori Potts,  42,  [place],      What time is it? (within 1 hour) {yes no:22349} {0-3:21082} 0 or 3  Count backwards from 20 {yes no:22349} {0-4:31231} 0, 2, or 4  Name the months of the year {yes no:22349} {0-4:31231} 0, 2, or 4  Repeat name & address above {yes no:22349} {0-10:5044} 0, 2, 4, 6, 8, or 10       TOTAL SCORE  ***/28   Interpretation:  {normal/abnormal:11317::"Normal"}  Normal (0-7) Abnormal (8-28)        Assessment & Plan:     Annual Wellness Visit  Reviewed patient's  Family Medical History Reviewed and updated list of patient's medical providers Assessment of cognitive impairment was done Assessed patient's functional ability Established a written schedule for health screening [place] Completed and Reviewed  Exercise Activities and Dietary recommendations Goals    None      Immunization History  Administered Date(s) Administered  . Influenza, High Dose Seasonal PF 06/10/2015, 06/20/2016    Health Maintenance  Topic Date Due  . Hepatitis C Screening  March 20, 1946  . OPHTHALMOLOGY EXAM  01/09/1956  . TETANUS/TDAP  01/08/1965  . MAMMOGRAM  01/09/1996  . COLONOSCOPY  01/09/1996  . ZOSTAVAX  01/08/2006  . PNA vac Low Risk Adult (1 of 2 - PCV13) 01/09/2011  . HEMOGLOBIN A1C  01/01/2017  . FOOT EXAM  07/04/2017  . INFLUENZA VACCINE  Completed  . DEXA SCAN  Completed     Discussed health benefits of physical activity, and encouraged her to engage in regular exercise appropriate for her age and condition.    ------------------------------------------------------------------------------------------------------------    Mar Daring, PA-C  [place]

## 2016-08-10 NOTE — Patient Outreach (Addendum)
[facility] Care Management  08/10/2016  Lori Potts [birth year] [id number]   RN Health Coach telephone call to patient.  [employer] compliance verified. Per patient she is on her way out the door. Patient requested that I call her back. RN made patient aware that I would call her back within the next 14 days. Patient voiced ok and agreed to follow up call back Plan: RN will call patient again within 14 days. [name] Gilberto Stanforth BSN RN [facility] Care Management [phone]

## 2016-08-16 ENCOUNTER — Encounter: Payer: Commercial Managed Care - HMO | Admitting: Family Medicine

## 2016-08-24 ENCOUNTER — Other Ambulatory Visit: Payer: Self-pay | Admitting: *Deleted

## 2016-08-24 NOTE — Patient Outreach (Signed)
[facility] Care Management  08/24/2016  Lori Potts 1945/12/30 [id number]    RN Health Coach telephone call to patient.  Patient unavailable. No voicemail pick up. Plan: RN will call patient again within 14 days.  [name] Aaron Boeh BSN RN [facility] Care Management [phone]

## 2016-09-06 ENCOUNTER — Other Ambulatory Visit: Payer: Self-pay | Admitting: *Deleted

## 2016-09-06 DIAGNOSIS — M1711 Unilateral primary osteoarthritis, right knee: Secondary | ICD-10-CM | POA: Diagnosis not present

## 2016-09-06 NOTE — Patient Outreach (Addendum)
Lori Potts) Care Management  09/06/2016   Lori Potts 1946/03/25 [id number]    RN Health Coach telephone call to patient.  Hipaa compliance verified. Per patient her fasting blood sugar was 122. Per patient she has not had any hypo or hyperglycemia reactions since last outreach call. Patient and husband have been reviewing the food charts. Patient stated that she is trying to eat much more healthier. Patient stated they have been eating more vegetables. Patient A1C is currently 83. She wants to see a decrease to at least 7 on the next blood draw in February. Patient stated that she has twisted her knee. She is having some discomfort. She is keeping it elevated and iced. Patient has agreed to further outreach calls.   Current Medications:  Current Outpatient Prescriptions  Medication Sig Dispense Refill  . ACCU-CHEK FASTCLIX LANCETS MISC     . albuterol (PROVENTIL HFA;VENTOLIN HFA) 108 (90 Base) MCG/ACT inhaler Inhale 2 puffs into the lungs every 6 (six) hours as needed (for wheezing and shortness of breath from seasonal allergies). 1 Inhaler 1  . albuterol (PROVENTIL) (2.5 MG/3ML) 0.083% nebulizer solution     . amoxicillin-clavulanate (AUGMENTIN) 875-125 MG tablet Take 1 tablet by mouth 2 (two) times daily. 20 tablet 0  . atorvastatin (LIPITOR) 40 MG tablet Take 1 tablet (40 mg total) by mouth daily. 90 tablet 1  . Blood Glucose Calibration (ACCU-CHEK SMARTVIEW CONTROL) LIQD     . Blood Glucose Monitoring Suppl (ACCU-CHEK NANO SMARTVIEW) w/Device KIT CHECK BLOOD SUGAR TWICE DAILY 1 kit 0  . fluticasone (FLONASE) 50 MCG/ACT nasal spray USE 1 SPRAY IN EACH NOSTRIL TWICE DAILY 48 g 3  . Fluticasone-Salmeterol (ADVAIR DISKUS) 250-50 MCG/DOSE AEPB Inhale 1 puff into the lungs 2 (two) times daily. 14 each 1  . glucose blood (ACCU-CHEK SMARTVIEW) test strip TEST BLOOD SUGARS 4 TIMES DAILY 150 each PRN  . guaiFENesin-codeine (CHERATUSSIN AC) 100-10 MG/5ML syrup Take 10 mLs by  mouth 4 (four) times daily as needed for cough. 400 mL 0  . ibuprofen (ADVIL,MOTRIN) 800 MG tablet Take 1 tablet (800 mg total) by mouth every 8 (eight) hours as needed. (Patient not taking: Reported on 08/01/2016) 30 tablet 0  . Insulin Detemir (LEVEMIR FLEXTOUCH) 100 UNIT/ML Pen Inject 20 Units into the skin daily at 10 pm. 4 pen 1  . Insulin Pen Needle (BD ULTRA-FINE PEN NEEDLES) 29G X 12.7MM MISC 1 pen by Does not apply route once. 100 each 3  . ipratropium-albuterol (DUONEB) 0.5-2.5 (3) MG/3ML SOLN Take 3 mLs by nebulization every 4 (four) hours as needed. 360 mL 3  . lisinopril-hydrochlorothiazide (PRINZIDE,ZESTORETIC) 20-25 MG tablet Take 1 tablet by mouth daily. 90 tablet 1  . loratadine (CLARITIN) 10 MG tablet Take 1 tablet (10 mg total) by mouth daily. 90 tablet 1  . metaxalone (SKELAXIN) 800 MG tablet Take 1 tablet (800 mg total) by mouth 3 (three) times daily. 30 tablet 0  . montelukast (SINGULAIR) 10 MG tablet TAKE 1 TABLET EVERY DAY 90 tablet 1  . ondansetron (ZOFRAN) 4 MG tablet TAKE 1 TABLET EVERY 8 HOURS AS NEEDED FOR NAUSEA OR VOMITING (Patient not taking: Reported on 08/01/2016) 20 tablet 0  . oxyCODONE-acetaminophen (PERCOCET) 10-325 MG tablet Take 1 tablet by mouth every 4 (four) hours as needed for pain.    . sitaGLIPtin (JANUVIA) 100 MG tablet Take 1 tablet (100 mg total) by mouth daily. 90 tablet 1  . Vitamin D, Ergocalciferol, (DRISDOL) 50000 units CAPS capsule Take 1  capsule (50,000 Units total) by mouth once a week. For 12 weeks 12 capsule 0   No current facility-administered medications for this visit.     Functional Status:  In your present state of health, do you have any difficulty performing the following activities: 09/06/2016 07/13/2016  Hearing? N N  Vision? N N  Difficulty concentrating or making decisions? N N  Walking or climbing stairs? N N  Dressing or bathing? N N  Doing errands, shopping? N N  Preparing Food and eating ? N -  Using the Toilet? N -   In the past six months, have you accidently leaked urine? Y -  Do you have problems with loss of bowel control? N -  Managing your Medications? N -  Managing your Finances? N -  Housekeeping or managing your Housekeeping? N -  Some recent data might be hidden    Fall/Depression Screening: PHQ 2/9 Scores 09/06/2016 07/13/2016 07/04/2016 05/25/2016 05/13/2016 04/19/2016 03/29/2016  PHQ - 2 Score 0 0 0 0 0 0 0   [facility] CM Care Plan Problem One   Flowsheet Row Most Recent Value  Care Plan Problem One  Knowledge Deficit in Self Management of Diabetes  Role Documenting the Problem One  [place] for Problem One  Active  [facility] CM Short Term Goal #3 (0-30 days)  Patient will report eating healthier snacks within the next 30 days  Interventions for Short Tern Goal #3  RN sent patient a list of healthy snacks to eat. RN discussed as patient shopping to pick up some of the items as her snack. RN willfollow up with discussion and teachback.  [facility] CM Short Term Goal #4 (0-30 days)  Patient will verbalize learning about carbohydrates counting and how it affects the blood glucose within the next 30 days  Interventions for Short Term Goal #4  RN reportes eating  healthier and not over indulging ith the Holidays. Patient is reporting reviewing the educational picture charts on counting carbs. RN sent EMMI educational material on counting carbs. RN  follows up with discussion and teach back      Assessment:  Patient and husband are reviewing food charts and educational material together Patient is eating healthier Patient wants to see a decrease in A1C to 7 at February blood draw Patient will continue to  benefit from [employer] telephonic outreach for education and support for diabetes self management. Plan:  RN discussed with patient about healthier eating and teach back Patient will have A1C drawn in February RN will follow up outreach with the month of February  Draden Cottingham [place] Care Management [phone]

## 2016-09-20 DIAGNOSIS — M533 Sacrococcygeal disorders, not elsewhere classified: Secondary | ICD-10-CM | POA: Diagnosis not present

## 2016-09-20 DIAGNOSIS — M5136 Other intervertebral disc degeneration, lumbar region: Secondary | ICD-10-CM | POA: Diagnosis not present

## 2016-09-29 ENCOUNTER — Telehealth: Payer: Self-pay | Admitting: Emergency Medicine

## 2016-09-29 NOTE — Telephone Encounter (Signed)
Cough, congested, hoarse for 7 days. Can a z-pak be sent into her pharmacy

## 2016-09-29 NOTE — Telephone Encounter (Signed)
Appointment made for patient for 09/30/16

## 2016-09-29 NOTE — Telephone Encounter (Signed)
Discussed patient's symptoms and recommended that she should come in to see us tomorrow afternoon  (09/30/16) at 4 PM. Patient verbalized agreement. Please make an appointment

## 2016-09-30 ENCOUNTER — Ambulatory Visit (INDEPENDENT_AMBULATORY_CARE_PROVIDER_SITE_OTHER): Payer: Medicare HMO | Admitting: Family Medicine

## 2016-09-30 ENCOUNTER — Encounter: Payer: Self-pay | Admitting: Family Medicine

## 2016-09-30 VITALS — BP 137/81 | HR 86 | Temp 98.1°F | Resp 16 | Ht 63.0 in | Wt 180.4 lb

## 2016-09-30 DIAGNOSIS — R05 Cough: Secondary | ICD-10-CM | POA: Diagnosis not present

## 2016-09-30 DIAGNOSIS — R059 Cough, unspecified: Secondary | ICD-10-CM

## 2016-09-30 DIAGNOSIS — J0101 Acute recurrent maxillary sinusitis: Secondary | ICD-10-CM

## 2016-09-30 MED ORDER — AZITHROMYCIN 250 MG PO TABS: ORAL_TABLET | ORAL | 0 refills | Status: DC

## 2016-09-30 MED ORDER — BENZONATATE 200 MG PO CAPS: 200.0000 mg | ORAL_CAPSULE | Freq: Three times a day (TID) | ORAL | 0 refills | Status: DC | PRN

## 2016-09-30 NOTE — Progress Notes (Signed)
Name: Lori Potts   MRN: [id number]    DOB: 22-Oct-1945   Date:09/30/2016       Progress Note  Subjective  Chief Complaint  Chief Complaint  Patient presents with  . Sinus Problem    x4 days    HPI  Pt. Presents for symptoms of upper respiratory infection. Started with malaise, then runny nose, drinage and finally coughing. Symptoms onset was 4 days ago, since then, she has been feeling worse.  No fevers, chills, or shortness of breath.   Past Medical History:  Diagnosis Date  . Arthritis   . Cataract    right eye but immature  . Diabetes mellitus without complication ([place])   . Essential hypertension, benign    takes Lisinopril-HCTZ daily  . GERD (gastroesophageal reflux disease)   . History of colon polyps    benign  . History of migraine    last one 2-3 months ago  . History of shingles   . Hyperlipidemia    takes Lipitor daily  . Hypertension   . Joint pain   . Low back pain   . Nocturia   . Seasonal allergies    takes Singulair daily as needed  . Type II or unspecified type diabetes mellitus without mention of complication, not stated as uncontrolled    Levemir nightly;average fasting blood sugar 125-140  . Unspecified essential hypertension   . Vitamin D deficiency    takes Vit D weekly  . Weakness    numbness and tingling left arm    Past Surgical History:  Procedure Laterality Date  . ABDOMINAL HYSTERECTOMY    . ANTERIOR CERVICAL DECOMP/DISCECTOMY FUSION N/A 02/26/2015   Procedure: ANTERIOR CERVICAL DISCECTOMY FUSION C4-5 (1 LEVEL);  Surgeon: Melina Schools, MD;  Location: [place];  Service: Orthopedics;  Laterality: N/A;  . ANTERIOR LAT LUMBAR FUSION N/A 03/21/2013   Procedure: ANTERIOR LATERAL LUMBAR FUSION 1 LEVEL/ XLIF L3-L4 ;  Surgeon: Melina Schools, MD;  Location: [place];  Service: Orthopedics;  Laterality: N/A;  . APPENDECTOMY    . BACK SURGERY    . BACK SURGERY     Lumbar fusion x 2  . BREAST SURGERY  1990   Augementation  . CARDIAC  CATHETERIZATION  5/12   ef 55%  . CARDIAC CATHETERIZATION  10/2010   [facility]; EF 55%  . CARDIAC CATHETERIZATION Left 02/16/2016   Procedure: Left Heart Cath and Coronary Angiography;  Surgeon: Yolonda Kida, MD;  Location: [place] [facility];  Service: Cardiovascular;  Laterality: Left;  . COLONOSCOPY    . ESOPHAGOGASTRODUODENOSCOPY    . [place]   growth removed from left kidney   . pain stimulator    . POSTERIOR CERVICAL FUSION/FORAMINOTOMY Right 03/21/2013   Procedure: POSTERIOR L2-3 RIGHT FORAMINOTOMY;  Surgeon: Melina Schools, MD;  Location: [place];  Service: Orthopedics;  Laterality: Right;  . REVISION OF SCAR TISSUE RECTUS MUSCLE    . SMALL BOWEL REPAIR    . SPINAL CORD STIMULATOR BATTERY EXCHANGE N/A 10/17/2012   Procedure: SPINAL CORD STIMULATOR BATTERY REMOVAL;  Surgeon: Melina Schools, MD;  Location: [place];  Service: Orthopedics;  Laterality: N/A;  . SPINAL CORD STIMULATOR BATTERY EXCHANGE N/A 07/22/2015   Procedure: REIMPLANTATION OF SPINAL CORD STIMULATOR BATTERY ;  Surgeon: Melina Schools, MD;  Location: [place];  Service: Orthopedics;  Laterality: N/A;  . TOTAL KNEE ARTHROPLASTY Left     Family History  Problem Relation Age of Onset  . Heart attack Mother  Social History   Social History  . Marital status: Married    Spouse name: N/A  . Number of children: N/A  . Years of education: N/A   Occupational History  . Not on file.   Social History Main Topics  . Smoking status: Former Smoker    Types: Cigarettes  . Smokeless tobacco: Never Used     Comment: quit smoking 68yr ago  . Alcohol use No  . Drug use: No  . Sexual activity: Yes    Birth control/ protection: Surgical   Other Topics Concern  . Not on file   Social History Narrative   ** Merged History Encounter **         Current Outpatient Prescriptions:  .  ACCU-CHEK FASTCLIX LANCETS MISC, , Disp: , Rfl:  .  albuterol (PROVENTIL HFA;VENTOLIN HFA) 108 (90 Base) MCG/ACT inhaler,  Inhale 2 puffs into the lungs every 6 (six) hours as needed (for wheezing and shortness of breath from seasonal allergies)., Disp: 1 Inhaler, Rfl: 1 .  albuterol (PROVENTIL) (2.5 MG/3ML) 0.083% nebulizer solution, , Disp: , Rfl:  .  amoxicillin-clavulanate (AUGMENTIN) 875-125 MG tablet, Take 1 tablet by mouth 2 (two) times daily., Disp: 20 tablet, Rfl: 0 .  atorvastatin (LIPITOR) 40 MG tablet, Take 1 tablet (40 mg total) by mouth daily., Disp: 90 tablet, Rfl: 1 .  Blood Glucose Calibration (ACCU-CHEK SMARTVIEW CONTROL) LIQD, , Disp: , Rfl:  .  Blood Glucose Monitoring Suppl (ACCU-CHEK NANO SMARTVIEW) w/Device KIT, CHECK BLOOD SUGAR TWICE DAILY, Disp: 1 kit, Rfl: 0 .  fluticasone (FLONASE) 50 MCG/ACT nasal spray, USE 1 SPRAY IN EACH NOSTRIL TWICE DAILY, Disp: 48 g, Rfl: 3 .  Fluticasone-Salmeterol (ADVAIR DISKUS) 250-50 MCG/DOSE AEPB, Inhale 1 puff into the lungs 2 (two) times daily., Disp: 14 each, Rfl: 1 .  glucose blood (ACCU-CHEK SMARTVIEW) test strip, TEST BLOOD SUGARS 4 TIMES DAILY, Disp: 150 each, Rfl: PRN .  ibuprofen (ADVIL,MOTRIN) 800 MG tablet, Take 1 tablet (800 mg total) by mouth every 8 (eight) hours as needed., Disp: 30 tablet, Rfl: 0 .  Insulin Detemir (LEVEMIR FLEXTOUCH) 100 UNIT/ML Pen, Inject 20 Units into the skin daily at 10 pm., Disp: 4 pen, Rfl: 1 .  Insulin Pen Needle (BD ULTRA-FINE PEN NEEDLES) 29G X 12.7MM MISC, 1 pen by Does not apply route once., Disp: 100 each, Rfl: 3 .  ipratropium-albuterol (DUONEB) 0.5-2.5 (3) MG/3ML SOLN, Take 3 mLs by nebulization every 4 (four) hours as needed., Disp: 360 mL, Rfl: 3 .  lisinopril-hydrochlorothiazide (PRINZIDE,ZESTORETIC) 20-25 MG tablet, Take 1 tablet by mouth daily., Disp: 90 tablet, Rfl: 1 .  loratadine (CLARITIN) 10 MG tablet, Take 1 tablet (10 mg total) by mouth daily., Disp: 90 tablet, Rfl: 1 .  metaxalone (SKELAXIN) 800 MG tablet, Take 1 tablet (800 mg total) by mouth 3 (three) times daily., Disp: 30 tablet, Rfl: 0 .   montelukast (SINGULAIR) 10 MG tablet, TAKE 1 TABLET EVERY DAY, Disp: 90 tablet, Rfl: 1 .  ondansetron (ZOFRAN) 4 MG tablet, TAKE 1 TABLET EVERY 8 HOURS AS NEEDED FOR NAUSEA OR VOMITING, Disp: 20 tablet, Rfl: 0 .  oxyCODONE-acetaminophen (PERCOCET) 10-325 MG tablet, Take 1 tablet by mouth every 4 (four) hours as needed for pain., Disp: , Rfl:  .  sitaGLIPtin (JANUVIA) 100 MG tablet, Take 1 tablet (100 mg total) by mouth daily., Disp: 90 tablet, Rfl: 1  Allergies  Allergen Reactions  . Adhesive [Tape] Rash and Other (See Comments)    Regular tape is ok,  allergy is to paper tape     Review of Systems  Constitutional: Negative for fever.  HENT: Positive for congestion, sinus pain and sore throat.   Respiratory: Positive for cough. Negative for shortness of breath.   Neurological: Positive for headaches.      Objective  Vitals:   09/30/16 1024  BP: 137/81  Pulse: 86  Resp: 16  Temp: 98.1 F (36.7 C)  TempSrc: Oral  SpO2: 97%  Weight: 180 lb 6.4 oz (81.8 kg)  Height: _0  (1.6 m)    Physical Exam  Constitutional: She is well-developed, well-nourished, and in no distress.  HENT:  Right Ear: Tympanic membrane and ear canal normal. No swelling. No decreased hearing is noted.  Left Ear: Tympanic membrane and ear canal normal. No swelling.  Nose: Right sinus exhibits maxillary sinus tenderness and frontal sinus tenderness. Left sinus exhibits maxillary sinus tenderness and frontal sinus tenderness.  Mouth/Throat: Posterior oropharyngeal erythema present.  Cardiovascular: Normal rate, regular rhythm, S1 normal, S2 normal and normal heart sounds.   No murmur heard. Pulmonary/Chest: Effort normal and breath sounds normal. No respiratory distress. She has no wheezes.  Nursing note and vitals reviewed.    Assessment & Plan  1. Acute recurrent maxillary sinusitis By history and exam, start on Z-Pak - azithromycin (ZITHROMAX) 250 MG tablet; 2 tabs po day 1,then 1 tab po q day x 4  days  Dispense: 6 each; Refill: 0  2. Cough  - benzonatate (TESSALON) 200 MG capsule; Take 1 capsule (200 mg total) by mouth 3 (three) times daily as needed for cough.  Dispense: 20 capsule; Refill: 0   Demani Mcbrien [facility] 09/30/2016 10:44 AM

## 2016-10-07 ENCOUNTER — Other Ambulatory Visit: Payer: Self-pay

## 2016-10-07 MED ORDER — FLUCONAZOLE 150 MG PO TABS: 150.0000 mg | ORAL_TABLET | Freq: Once | ORAL | 0 refills | Status: AC

## 2016-10-07 NOTE — Telephone Encounter (Signed)
Routed note to Dr. Sherryll BurgerShah, pt states antibiotic that she was started on has given her a yeast infection and would like to be started on Diflucan, please send to [facility]

## 2016-10-13 DIAGNOSIS — M533 Sacrococcygeal disorders, not elsewhere classified: Secondary | ICD-10-CM | POA: Diagnosis not present

## 2016-10-13 DIAGNOSIS — M5136 Other intervertebral disc degeneration, lumbar region: Secondary | ICD-10-CM | POA: Diagnosis not present

## 2016-10-14 ENCOUNTER — Ambulatory Visit: Payer: Self-pay | Admitting: *Deleted

## 2016-10-20 ENCOUNTER — Other Ambulatory Visit: Payer: Self-pay | Admitting: *Deleted

## 2016-10-20 NOTE — Patient Outreach (Signed)
[facility]) Care Management  10/20/2016   Lori Potts Apr 23, 1946 [id number]  RN Health Coach telephone call to patient.  [employer] compliance verified. Per patient she is having a lot of back and knee pain. Patient received an injection in back. Per patient this made her blood sugar level  elevate. Patient is pending knee surgery in summer.Patient is trying to loose weight before knee surgery.  Patient had hypoglycemic reaction when blood sugar in 70.  She drank orange juice. Per patient her fasting blood sugar was 154. Patient is taking medications as prescribed, but patient stated she hates taking pills.  Patien is also trying to get her cholesterol levels down. Patient discussed home remedies. RN explained that it is okay to try home remedies but check with her physician first. Patient and RN discussed diets. RN also explained to patient too check with her physician because some diets can affect the kidneys and she does have diabetes,  Patient has agreed to follow up outreach calls.     Current Medications:  Current Outpatient Prescriptions  Medication Sig Dispense Refill  . ACCU-CHEK FASTCLIX LANCETS MISC     . albuterol (PROVENTIL HFA;VENTOLIN HFA) 108 (90 Base) MCG/ACT inhaler Inhale 2 puffs into the lungs every 6 (six) hours as needed (for wheezing and shortness of breath from seasonal allergies). 1 Inhaler 1  . albuterol (PROVENTIL) (2.5 MG/3ML) 0.083% nebulizer solution     . atorvastatin (LIPITOR) 40 MG tablet Take 1 tablet (40 mg total) by mouth daily. 90 tablet 1  . azithromycin (ZITHROMAX) 250 MG tablet 2 tabs po day 1,then 1 tab po q day x 4 days 6 each 0  . benzonatate (TESSALON) 200 MG capsule Take 1 capsule (200 mg total) by mouth 3 (three) times daily as needed for cough. 20 capsule 0  . Blood Glucose Calibration (ACCU-CHEK SMARTVIEW CONTROL) LIQD     . Blood Glucose Monitoring Suppl (ACCU-CHEK NANO SMARTVIEW) w/Device KIT CHECK BLOOD SUGAR TWICE DAILY 1 kit 0   . fluticasone (FLONASE) 50 MCG/ACT nasal spray USE 1 SPRAY IN EACH NOSTRIL TWICE DAILY 48 g 3  . Fluticasone-Salmeterol (ADVAIR DISKUS) 250-50 MCG/DOSE AEPB Inhale 1 puff into the lungs 2 (two) times daily. 14 each 1  . glucose blood (ACCU-CHEK SMARTVIEW) test strip TEST BLOOD SUGARS 4 TIMES DAILY 150 each PRN  . ibuprofen (ADVIL,MOTRIN) 800 MG tablet Take 1 tablet (800 mg total) by mouth every 8 (eight) hours as needed. 30 tablet 0  . Insulin Detemir (LEVEMIR FLEXTOUCH) 100 UNIT/ML Pen Inject 20 Units into the skin daily at 10 pm. 4 pen 1  . Insulin Pen Needle (BD ULTRA-FINE PEN NEEDLES) 29G X 12.7MM MISC 1 pen by Does not apply route once. 100 each 3  . ipratropium-albuterol (DUONEB) 0.5-2.5 (3) MG/3ML SOLN Take 3 mLs by nebulization every 4 (four) hours as needed. 360 mL 3  . lisinopril-hydrochlorothiazide (PRINZIDE,ZESTORETIC) 20-25 MG tablet Take 1 tablet by mouth daily. 90 tablet 1  . loratadine (CLARITIN) 10 MG tablet Take 1 tablet (10 mg total) by mouth daily. 90 tablet 1  . metaxalone (SKELAXIN) 800 MG tablet Take 1 tablet (800 mg total) by mouth 3 (three) times daily. 30 tablet 0  . montelukast (SINGULAIR) 10 MG tablet TAKE 1 TABLET EVERY DAY 90 tablet 1  . ondansetron (ZOFRAN) 4 MG tablet TAKE 1 TABLET EVERY 8 HOURS AS NEEDED FOR NAUSEA OR VOMITING 20 tablet 0  . oxyCODONE-acetaminophen (PERCOCET) 10-325 MG tablet Take 1 tablet by mouth every  4 (four) hours as needed for pain.    . sitaGLIPtin (JANUVIA) 100 MG tablet Take 1 tablet (100 mg total) by mouth daily. 90 tablet 1   No current facility-administered medications for this visit.     Functional Status:  In your present state of health, do you have any difficulty performing the following activities: 10/20/2016 09/30/2016  Hearing? N N  Vision? N Y  Difficulty concentrating or making decisions? N N  Walking or climbing stairs? N N  Dressing or bathing? N N  Doing errands, shopping? N N  Preparing Food and eating ? N -  Using  the Toilet? N -  In the past six months, have you accidently leaked urine? N -  Do you have problems with loss of bowel control? N -  Managing your Medications? N -  Managing your Finances? N -  Housekeeping or managing your Housekeeping? Y -  Some recent data might be hidden    Fall/Depression Screening: PHQ 2/9 Scores 10/20/2016 09/30/2016 09/06/2016 07/13/2016 07/04/2016 05/25/2016 05/13/2016  PHQ - 2 Score 0 0 0 0 0 0 0   THN CM Care Plan Problem One   Flowsheet Row Most Recent Value  Care Plan Problem One  Knowledge Deficit in Self Management of Diabetes  Role Documenting the Problem One  [place] for Problem One  Active  THN Long Term Goal (31-90 days)  Patient will see A1C decrease with next blood draw within the next 90 days  THN Long Term Goal Start Date  10/20/16  Interventions for Problem One Long Term Goal  RN discussed A1C goals. RN discussed fasting blood glucose range needs to be 80- 130 to see the A1C range < 7  THN CM Short Term Goal #3 (0-30 days)  Patient will report eating healthier snacks within the next 30 days  THN CM Short Term Goal #3 Start Date  10/20/16  Interventions for Short Tern Goal #3  RN sent patient a list of healthy snacks to eat. RN discussed as patient shopping to pick up some of the items as her snack. RN willfollow up with discussion and teachback.  THN CM Short Term Goal #4 Met Date  10/20/16  THN CM Short Term Goal #5 (0-30 days)  Patient will understand what the results of lipid panel means within the next 30 days  THN CM Short Term Goal #5 Start Date  10/20/16  Interventions for Short Term Goal #5  RN sent educational information on cholesterol results and what they mean. RN will follow up with outreach calls      Assessment:  Patient will continue to benefit from [place] telephonic outreach for education and support for diabetes self management.  Plan:  RN sent patient a list of diabetic choices from chain foods  restaurants RN discussed weight loss and making food choices RN sent educational material on understanding  cholesterol results RN will follow up outreach in the month of April  Lori Potts [place] [facility] [phone]

## 2016-10-21 ENCOUNTER — Encounter: Payer: Self-pay | Admitting: *Deleted

## 2016-10-26 ENCOUNTER — Other Ambulatory Visit: Payer: Self-pay | Admitting: Family Medicine

## 2016-10-26 ENCOUNTER — Other Ambulatory Visit: Payer: Self-pay | Admitting: Physician Assistant

## 2016-10-26 DIAGNOSIS — J40 Bronchitis, not specified as acute or chronic: Secondary | ICD-10-CM

## 2016-10-26 NOTE — Telephone Encounter (Signed)
Last ov 08/01/16 Last filled 12/01/15. Please review. Thank you. sd

## 2016-11-03 ENCOUNTER — Other Ambulatory Visit: Payer: Self-pay | Admitting: Emergency Medicine

## 2016-11-03 MED ORDER — ONDANSETRON HCL 4 MG PO TABS: ORAL_TABLET | ORAL | 0 refills | Status: DC

## 2016-11-04 ENCOUNTER — Telehealth: Payer: Self-pay | Admitting: Emergency Medicine

## 2016-11-04 MED ORDER — RANITIDINE HCL 300 MG PO TABS: 300.0000 mg | ORAL_TABLET | Freq: Every day | ORAL | 0 refills | Status: DC

## 2016-11-04 NOTE — Telephone Encounter (Signed)
Patient notified and script sent to pharmacy by Dr. Sherie DonLada

## 2016-11-04 NOTE — Telephone Encounter (Signed)
I reviewed chart; I do not see hx of Barrett's She is over 71 years of age Please let the patient know that I'm covering for Dr. Sherryll BurgerShah I will recommend a different medicine called ranitidine for heartburn Try this and if it doesn't work, discuss with Dr. Sherryll BurgerShah

## 2016-11-04 NOTE — Telephone Encounter (Signed)
Nexium is not working for her and insurance will not cover. Can Aciphex be sent in. She can this much cheaper and has used before

## 2016-11-15 ENCOUNTER — Ambulatory Visit: Payer: Medicare HMO | Admitting: Family Medicine

## 2016-11-17 ENCOUNTER — Telehealth: Payer: Self-pay | Admitting: Emergency Medicine

## 2016-11-17 NOTE — Telephone Encounter (Signed)
Have had a cough, congested for 4 days. Could not get appointment here or at [facility]. Would like cough med called in.

## 2016-11-18 NOTE — Telephone Encounter (Signed)
If patient cannot obtain an appointment at our practice, she should be seen at urgent care, otherwise I am happy to see her to evaluate her symptoms and prescribe appropriate therapy

## 2016-11-21 ENCOUNTER — Other Ambulatory Visit: Payer: Self-pay | Admitting: *Deleted

## 2016-11-21 ENCOUNTER — Ambulatory Visit (INDEPENDENT_AMBULATORY_CARE_PROVIDER_SITE_OTHER): Payer: Medicare HMO | Admitting: Family Medicine

## 2016-11-21 ENCOUNTER — Encounter: Payer: Self-pay | Admitting: Family Medicine

## 2016-11-21 VITALS — BP 136/64 | HR 93 | Temp 98.0°F | Resp 16 | Wt 181.3 lb

## 2016-11-21 DIAGNOSIS — E119 Type 2 diabetes mellitus without complications: Secondary | ICD-10-CM | POA: Diagnosis not present

## 2016-11-21 DIAGNOSIS — J209 Acute bronchitis, unspecified: Secondary | ICD-10-CM

## 2016-11-21 DIAGNOSIS — M533 Sacrococcygeal disorders, not elsewhere classified: Secondary | ICD-10-CM | POA: Diagnosis not present

## 2016-11-21 DIAGNOSIS — M5136 Other intervertebral disc degeneration, lumbar region: Secondary | ICD-10-CM | POA: Diagnosis not present

## 2016-11-21 DIAGNOSIS — J0101 Acute recurrent maxillary sinusitis: Secondary | ICD-10-CM | POA: Diagnosis not present

## 2016-11-21 MED ORDER — PROMETHAZINE-DM 6.25-15 MG/5ML PO SYRP: 5.0000 mL | ORAL_SOLUTION | Freq: Four times a day (QID) | ORAL | 0 refills | Status: AC | PRN

## 2016-11-21 MED ORDER — AMOXICILLIN-POT CLAVULANATE 875-125 MG PO TABS: 1.0000 | ORAL_TABLET | Freq: Two times a day (BID) | ORAL | 0 refills | Status: DC

## 2016-11-21 NOTE — Patient Outreach (Signed)
[facility]) Care Management  11/21/2016  Lori Potts Sep 28, 1945 [id number]    RN CM attempted #1  Follow up outreach call to patient.  Patient was unavailable. No voicemail pick up. Plan: RN will call patient again within 14 days.     Gean Maidens BSN RN [facility] [phone]

## 2016-11-21 NOTE — Patient Instructions (Addendum)
Start the antibiotics Please do eat yogurt daily or take a probiotic daily for the next month or two We want to replace the healthy germs in the gut If you notice foul, watery diarrhea in the next two months, schedule an appointment RIGHT AWAY Use the cough medicine if needed

## 2016-11-21 NOTE — Assessment & Plan Note (Addendum)
Use SABA inhaler or nebulizer as directed; patient declines prednisone, as she is a diabetic and it will cause hyperglycemia she says

## 2016-11-21 NOTE — Assessment & Plan Note (Signed)
Patient does not want to use steroids since she has diabetes

## 2016-11-21 NOTE — Assessment & Plan Note (Signed)
7 days of symptoms; recurrent problem; will treat with antibiotics; discussed risk of C diff, use probiotics or yogurt; call if needed

## 2016-11-21 NOTE — Progress Notes (Signed)
BP 136/64 (BP Location: Right Arm, Patient Position: Sitting, Cuff Size: Large)   Pulse 93   Temp 98 F (36.7 C) (Oral)   Resp 16   Wt 181 lb 4.8 oz (82.2 kg)   SpO2 95%   BMI 32.12 kg/m    Subjective:    Patient ID: Lori Potts, female    DOB: [birth year], 71 y.o.   MRN: [id number]  HPI: Lori Potts is a 71 y.o. female  Chief Complaint  Patient presents with  . Cough  . Nasal Congestion    Here for an acute visit; usually sees another provider here; sick with a sinus infection Seven days of sinus congestion, using mucinex D Slight fever, sinus pressure/pain Ear pressure/pain Coughing, but not coming up like it should; has hx of bronchospasm and uses SABA; she has bee using her nebulizer and that has really helped She has diabetes and does not want steroids Has used tessalon perles in the past but those have not really helped Nasal drainage No rash No travel  Depression screen [facility] 2/9 10/20/2016 09/30/2016 09/06/2016 07/13/2016 07/04/2016  Decreased Interest 0 0 0 0 0  Down, Depressed, Hopeless 0 0 0 0 0  PHQ - 2 Score 0 0 0 0 0   Relevant past medical, surgical, family and social history reviewed Past Medical History:  Diagnosis Date  . Arthritis   . Cataract    right eye but immature  . Diabetes mellitus without complication ([facility])   . Essential hypertension, benign    takes Lisinopril-HCTZ daily  . GERD (gastroesophageal reflux disease)   . History of colon polyps    benign  . History of migraine    last one 2-3 months ago  . History of shingles   . Hyperlipidemia    takes Lipitor daily  . Hypertension   . Joint pain   . Low back pain   . Nocturia   . Seasonal allergies    takes Singulair daily as needed  . Type II or unspecified type diabetes mellitus without mention of complication, not stated as uncontrolled    Levemir nightly;average fasting blood sugar 125-140  . Unspecified essential hypertension   . Vitamin D deficiency    takes Vit D  weekly  . Weakness    numbness and tingling left arm   Past Surgical History:  Procedure Laterality Date  . ABDOMINAL HYSTERECTOMY    . ANTERIOR CERVICAL DECOMP/DISCECTOMY FUSION N/A 02/26/2015   Procedure: ANTERIOR CERVICAL DISCECTOMY FUSION C4-5 (1 LEVEL);  Surgeon: Venita Lick, MD;  Location: [facility];  Service: Orthopedics;  Laterality: N/A;  . ANTERIOR LAT LUMBAR FUSION N/A 03/21/2013   Procedure: ANTERIOR LATERAL LUMBAR FUSION 1 LEVEL/ XLIF L3-L4 ;  Surgeon: Venita Lick, MD;  Location: [facility];  Service: Orthopedics;  Laterality: N/A;  . APPENDECTOMY    . BACK SURGERY    . BACK SURGERY     Lumbar fusion x 2  . BREAST SURGERY  1990   Augementation  . CARDIAC CATHETERIZATION  5/12   ef 55%  . CARDIAC CATHETERIZATION  10/2010   [facility]; EF 55%  . CARDIAC CATHETERIZATION Left 02/16/2016   Procedure: Left Heart Cath and Coronary Angiography;  Surgeon: Alwyn Pea, MD;  Location: [facility] INV[facility];  Service: Cardiovascular;  Laterality: Left;  . COLONOSCOPY    . ESOPHAGOGASTRODUODENOSCOPY    . KIDNEY SURGERY  1998   growth removed from left kidney   . pain stimulator    .  POSTERIOR CERVICAL FUSION/FORAMINOTOMY Right 03/21/2013   Procedure: POSTERIOR L2-3 RIGHT FORAMINOTOMY;  Surgeon: Venita Lick, MD;  Location: [facility] OR;  Service: Orthopedics;  Laterality: Right;  . REVISION OF SCAR TISSUE RECTUS MUSCLE    . SMALL BOWEL REPAIR    . SPINAL CORD STIMULATOR BATTERY EXCHANGE N/A 10/17/2012   Procedure: SPINAL CORD STIMULATOR BATTERY REMOVAL;  Surgeon: Venita Lick, MD;  Location: [facility] OR;  Service: Orthopedics;  Laterality: N/A;  . SPINAL CORD STIMULATOR BATTERY EXCHANGE N/A 07/22/2015   Procedure: REIMPLANTATION OF SPINAL CORD STIMULATOR BATTERY ;  Surgeon: Venita Lick, MD;  Location: [facility];  Service: Orthopedics;  Laterality: N/A;  . TOTAL KNEE ARTHROPLASTY Left    Social History  Substance Use Topics  . Smoking status: Former Smoker    Types: Cigarettes  . Smokeless tobacco:  Never Used     Comment: quit smoking 102yrs ago  . Alcohol use No   Interim medical history since last visit reviewed. Allergies and medications reviewed  Review of Systems Per HPI unless specifically indicated above     Objective:    BP 136/64 (BP Location: Right Arm, Patient Position: Sitting, Cuff Size: Large)   Pulse 93   Temp 98 F (36.7 C) (Oral)   Resp 16   Wt 181 lb 4.8 oz (82.2 kg)   SpO2 95%   BMI 32.12 kg/m   Wt Readings from Last 3 Encounters:  11/21/16 181 lb 4.8 oz (82.2 kg)  09/30/16 180 lb 6.4 oz (81.8 kg)  08/01/16 184 lb (83.5 kg)    Physical Exam  Constitutional: She appears well-developed and well-nourished. No distress.  HENT:  Head: Normocephalic and atraumatic.  Right Ear: External ear normal.  Left Ear: External ear normal.  Mouth/Throat: Oropharynx is clear and moist. No oropharyngeal exudate.  Eyes: Right eye exhibits no discharge. Left eye exhibits no discharge.  Cardiovascular: Normal rate and regular rhythm.   Pulmonary/Chest: Effort normal. No accessory muscle usage. No respiratory distress. She has no decreased breath sounds. She has wheezes. She has no rhonchi. She has no rales.  Lymphadenopathy:    She has no cervical adenopathy.  Skin: She is not diaphoretic.      Assessment & Plan:   Problem List Items Addressed This Visit      Respiratory   Bronchitis with bronchospasm    Use SABA inhaler or nebulizer as directed; patient declines prednisone, as she is a diabetic and it will cause hyperglycemia she says      Acute maxillary sinusitis, unspecified - Primary    7 days of symptoms; recurrent problem; will treat with antibiotics; discussed risk of C diff, use probiotics or yogurt; call if needed      Relevant Medications   amoxicillin-clavulanate (AUGMENTIN) 875-125 MG tablet   promethazine-dextromethorphan (PROMETHAZINE-DM) 6.25-15 MG/5ML syrup     Endocrine   Type 2 diabetes mellitus (HCC)    Patient does not want to use  steroids since she has diabetes      Relevant Medications   lisinopril (PRINIVIL,ZESTRIL) 10 MG tablet      Follow up plan: Return if symptoms worsen or fail to improve.  An after-visit summary was printed and given to the patient at check-out.  Please see the patient instructions which may contain other information and recommendations beyond what is mentioned above in the assessment and plan.  Meds ordered this encounter  Medications  . lisinopril (PRINIVIL,ZESTRIL) 10 MG tablet    Sig: Take by mouth.  Marland Kitchen amoxicillin-clavulanate (AUGMENTIN) 875-125 MG  tablet    Sig: Take 1 tablet by mouth 2 (two) times daily.    Dispense:  20 tablet    Refill:  0  . promethazine-dextromethorphan (PROMETHAZINE-DM) 6.25-15 MG/5ML syrup    Sig: Take 5 mLs by mouth every 6 (six) hours as needed for cough.    Dispense:  180 mL    Refill:  0    No orders of the defined types were placed in this encounter.

## 2016-11-21 NOTE — Telephone Encounter (Signed)
Appointment made for today

## 2016-11-29 ENCOUNTER — Encounter: Payer: Self-pay | Admitting: Family Medicine

## 2016-11-29 ENCOUNTER — Ambulatory Visit (INDEPENDENT_AMBULATORY_CARE_PROVIDER_SITE_OTHER): Payer: Medicare HMO | Admitting: Family Medicine

## 2016-11-29 ENCOUNTER — Other Ambulatory Visit: Payer: Self-pay | Admitting: *Deleted

## 2016-11-29 VITALS — BP 138/80 | HR 86 | Temp 97.7°F | Resp 14 | Wt 181.0 lb

## 2016-11-29 DIAGNOSIS — G4700 Insomnia, unspecified: Secondary | ICD-10-CM | POA: Insufficient documentation

## 2016-11-29 DIAGNOSIS — J0141 Acute recurrent pansinusitis: Secondary | ICD-10-CM | POA: Diagnosis not present

## 2016-11-29 DIAGNOSIS — IMO0001 Reserved for inherently not codable concepts without codable children: Secondary | ICD-10-CM

## 2016-11-29 DIAGNOSIS — Z1231 Encounter for screening mammogram for malignant neoplasm of breast: Secondary | ICD-10-CM

## 2016-11-29 DIAGNOSIS — E1165 Type 2 diabetes mellitus with hyperglycemia: Secondary | ICD-10-CM

## 2016-11-29 DIAGNOSIS — Z794 Long term (current) use of insulin: Secondary | ICD-10-CM

## 2016-11-29 DIAGNOSIS — Z1239 Encounter for other screening for malignant neoplasm of breast: Secondary | ICD-10-CM

## 2016-11-29 MED ORDER — LEVOFLOXACIN 500 MG PO TABS: 500.0000 mg | ORAL_TABLET | Freq: Every day | ORAL | 0 refills | Status: DC

## 2016-11-29 MED ORDER — TRAZODONE HCL 50 MG PO TABS: 25.0000 mg | ORAL_TABLET | Freq: Every evening | ORAL | 3 refills | Status: DC | PRN

## 2016-11-29 MED ORDER — IBUPROFEN 800 MG PO TABS: 800.0000 mg | ORAL_TABLET | Freq: Three times a day (TID) | ORAL | 0 refills | Status: DC | PRN

## 2016-11-29 MED ORDER — MOMETASONE FUROATE 50 MCG/ACT NA SUSP: 2.0000 | Freq: Every day | NASAL | 12 refills | Status: DC

## 2016-11-29 NOTE — Assessment & Plan Note (Signed)
Discussed injectables as option to help control diabetes; patient will consider; no thyroid cancer, no MEN-2 fam hx; return for labs and discussion (will get A1c in-house at f/u)

## 2016-11-29 NOTE — Patient Outreach (Signed)
[facility]) Care Management  11/29/2016   Lori Potts 21-Jul-1946 [id number]  RN Health Coach telephone call to patient.  Hipaa compliance verified. Per patient she has a bad sinus infection. Her head is throbbing. Patient went to Dr 8 days ago and have been on antibiotics. Per patient she doesn't feel any better. Patient is having some diarrhea but it is not real watery and foul as the doctor had ask her to report. Per patient the diarrhea is better. Patient stated she is eating and drinking. She has no appetite loss. Patient is taking medications as per ordered. Patient is not having any hypo or hyperglycemic reactions. Patient has agreed to follow up outreach calls.   Current Medications:  Current Outpatient Prescriptions  Medication Sig Dispense Refill  . ACCU-CHEK FASTCLIX LANCETS MISC     . albuterol (PROVENTIL HFA;VENTOLIN HFA) 108 (90 Base) MCG/ACT inhaler Inhale 2 puffs into the lungs every 6 (six) hours as needed (for wheezing and shortness of breath from seasonal allergies). 1 Inhaler 1  . albuterol (PROVENTIL) (2.5 MG/3ML) 0.083% nebulizer solution     . amoxicillin-clavulanate (AUGMENTIN) 875-125 MG tablet Take 1 tablet by mouth 2 (two) times daily. 20 tablet 0  . atorvastatin (LIPITOR) 40 MG tablet Take 1 tablet (40 mg total) by mouth daily. 90 tablet 1  . Blood Glucose Calibration (ACCU-CHEK SMARTVIEW CONTROL) LIQD     . Blood Glucose Monitoring Suppl (ACCU-CHEK NANO SMARTVIEW) w/Device KIT CHECK BLOOD SUGAR TWICE DAILY 1 kit 0  . fluticasone (FLONASE) 50 MCG/ACT nasal spray USE 1 SPRAY IN EACH NOSTRIL TWICE DAILY 48 g 3  . Fluticasone-Salmeterol (ADVAIR DISKUS) 250-50 MCG/DOSE AEPB Inhale 1 puff into the lungs 2 (two) times daily. 14 each 1  . glucose blood (ACCU-CHEK SMARTVIEW) test strip TEST BLOOD SUGARS 4 TIMES DAILY 150 each PRN  . ibuprofen (ADVIL,MOTRIN) 800 MG tablet Take 1 tablet (800 mg total) by mouth every 8 (eight) hours as needed. 30 tablet 0   . Insulin Detemir (LEVEMIR FLEXTOUCH) 100 UNIT/ML Pen Inject 20 Units into the skin daily at 10 pm. 4 pen 1  . Insulin Pen Needle (BD ULTRA-FINE PEN NEEDLES) 29G X 12.7MM MISC 1 pen by Does not apply route once. 100 each 3  . ipratropium-albuterol (DUONEB) 0.5-2.5 (3) MG/3ML SOLN TAKE 3 MLS BY NEBULIZATION EVERY 4 (FOUR) HOURS AS NEEDED. 360 mL 3  . lisinopril (PRINIVIL,ZESTRIL) 10 MG tablet Take by mouth.    Marland Kitchen lisinopril-hydrochlorothiazide (PRINZIDE,ZESTORETIC) 20-25 MG tablet Take 1 tablet by mouth daily. 90 tablet 1  . loratadine (CLARITIN) 10 MG tablet Take 1 tablet (10 mg total) by mouth daily. 90 tablet 1  . metaxalone (SKELAXIN) 800 MG tablet Take 1 tablet (800 mg total) by mouth 3 (three) times daily. 30 tablet 0  . montelukast (SINGULAIR) 10 MG tablet TAKE 1 TABLET EVERY DAY 90 tablet 1  . ondansetron (ZOFRAN) 4 MG tablet TAKE 1 TABLET EVERY 8 HOURS AS NEEDED FOR NAUSEA OR VOMITING 20 tablet 0  . oxyCODONE-acetaminophen (PERCOCET) 10-325 MG tablet Take 1 tablet by mouth every 4 (four) hours as needed for pain.    . promethazine-dextromethorphan (PROMETHAZINE-DM) 6.25-15 MG/5ML syrup Take 5 mLs by mouth every 6 (six) hours as needed for cough. 180 mL 0  . ranitidine (ZANTAC) 300 MG tablet Take 1 tablet (300 mg total) by mouth at bedtime. 30 tablet 0  . sitaGLIPtin (JANUVIA) 100 MG tablet Take 1 tablet (100 mg total) by mouth daily. 90 tablet 1  No current facility-administered medications for this visit.     Functional Status:  In your present state of health, do you have any difficulty performing the following activities: 10/20/2016 09/30/2016  Hearing? N N  Vision? N Y  Difficulty concentrating or making decisions? N N  Walking or climbing stairs? N N  Dressing or bathing? N N  Doing errands, shopping? N N  Preparing Food and eating ? N -  Using the Toilet? N -  In the past six months, have you accidently leaked urine? N -  Do you have problems with loss of bowel control? N -   Managing your Medications? N -  Managing your Finances? N -  Housekeeping or managing your Housekeeping? Y -  Some recent data might be hidden    Fall/Depression Screening: PHQ 2/9 Scores 11/29/2016 10/20/2016 09/30/2016 09/06/2016 07/13/2016 07/04/2016 05/25/2016  PHQ - 2 Score 0 0 0 0 0 0 0   [facility] CM Care Plan Problem One     Most Recent Value  Care Plan Problem One  Knowledge Deficit in Self Management of Diabetes  Role Documenting the Problem One  [place] for Problem One  Active  [facility]N Long Term Goal (31-90 days)  Patient will see A1C decrease with next blood draw within the next 90 days  [facility] Long Term Goal Start Date  11/29/16  Interventions for Problem One Long Term Goal  RN discussed A1C goals. RN discussed fasting blood glucose range needs to be 80- 130 to see the A1C range < 7  [facility] CM Short Term Goal #3 (0-30 days)  Patient will report eating healthier snacks within the next 30 days  [facility] CM Short Term Goal #3 Start Date  11/29/16  Interventions for Short Tern Goal #3  RN sent patient a list of healthy snacks to eat. RN discussed as patient shopping to pick up some of the items as her snack. RN willfollow up with discussion and teachback.      Assessment:  Patient has sinus infection Blood sugars ranging from 139-140's Patient will continue benefit from [place] telephonic outreach for education and support for diabetes self management.  Plan:  Patient will notify Dr that she doesn't feel better today RN will send diabetes sick plan RN will send educational material on sinusitis RN will follow up within the month of May  Lori Potts Management [phone]

## 2016-11-29 NOTE — Assessment & Plan Note (Signed)
Hopefully, will be short-lived; will Rx trazodone since she is asking for something to help her sleep

## 2016-11-29 NOTE — Patient Instructions (Signed)
Start the antibiotic Please do eat yogurt daily or take a probiotic daily for the next month or two We want to replace the healthy germs in the gut If you notice foul, watery diarrhea in the next two months, schedule an appointment RIGHT AWAY It's okay to use trazodone if needed for sleep Try vitamin C (orange juice if not diabetic or vitamin C tablets) and drink green tea to help your immune system during your illness Get plenty of rest and hydration Return in a few weeks to see me and get fasting labs and we'll address your diabetes and cholesterol

## 2016-11-29 NOTE — Progress Notes (Signed)
BP 138/80 (BP Location: Left Arm, Cuff Size: Normal)   Pulse 86   Temp 97.7 F (36.5 C) (Oral)   Resp 14   Wt 181 lb (82.1 kg)   SpO2 95%   BMI 32.06 kg/m    Subjective:    Patient ID: Lori Potts, female    DOB: 06-04-1946, 71 y.o.   MRN: [id number]  HPI: Lori Potts is a 71 y.o. female  Chief Complaint  Patient presents with  . Sinus Problem    Sinus headaches and pressure    She has been sick for about 3 weeks; ongoing issues with sinuses; worst this time of year Last visit, she was given cough medicine and an antibiotic Just feels drained and can't rest She is asking for something to help with sleep Not deep in the chest, but a little cough No fevers No rash Headache and pressures in the right side over the eye and cheek area Blowing out a lot of stuff Gobs in the morning Started to get better on the antibiotics  Trying to get sugars down prior to knee replacement; interested in injection; no thyroid cancer and no MEN-2 Lab Results  Component Value Date   HGBA1C 8.3 07/04/2016     Depression screen [facility] 2/9 11/29/2016 11/29/2016 10/20/2016 09/30/2016 09/06/2016  Decreased Interest 0 0 0 0 0  Down, Depressed, Hopeless 0 0 0 0 0  PHQ - 2 Score 0 0 0 0 0    Relevant past medical, surgical, family and social history reviewed Past Medical History:  Diagnosis Date  . Arthritis   . Cataract    right eye but immature  . Essential hypertension, benign    takes Lisinopril-HCTZ daily  . GERD (gastroesophageal reflux disease)   . History of colon polyps    benign  . History of migraine   . History of shingles   . Hyperlipidemia    takes Lipitor daily  . Low back pain   . Nocturia   . Seasonal allergies    takes Singulair daily as needed  . Type II or unspecified type diabetes mellitus without mention of complication, not stated as uncontrolled   . Vitamin D deficiency    takes Vit D weekly  . Weakness    numbness and tingling left arm   Family  History  Problem Relation Age of Onset  . Heart attack Mother   . Sarcoidosis Sister    Social History  Substance Use Topics  . Smoking status: Former Smoker    Types: Cigarettes  . Smokeless tobacco: Never Used     Comment: quit smoking 44yrs ago  . Alcohol use No   Interim medical history since last visit reviewed. Allergies and medications reviewed  Review of Systems Per HPI unless specifically indicated above     Objective:    BP 138/80 (BP Location: Left Arm, Cuff Size: Normal)   Pulse 86   Temp 97.7 F (36.5 C) (Oral)   Resp 14   Wt 181 lb (82.1 kg)   SpO2 95%   BMI 32.06 kg/m   Wt Readings from Last 3 Encounters:  11/29/16 181 lb (82.1 kg)  11/21/16 181 lb 4.8 oz (82.2 kg)  09/30/16 180 lb 6.4 oz (81.8 kg)    Physical Exam  Constitutional: She appears well-developed and well-nourished. No distress.  HENT:  Head: Normocephalic and atraumatic.  Right Ear: External ear and ear canal normal. No drainage. Tympanic membrane is retracted.  Left Ear: External ear  and ear canal normal. No drainage. Tympanic membrane is retracted.  Nose: Mucosal edema and rhinorrhea present.  Mouth/Throat: Oropharynx is clear and moist. No oropharyngeal exudate.  Eyes: Right eye exhibits no discharge. Left eye exhibits no discharge.  Cardiovascular: Normal rate and regular rhythm.   Pulmonary/Chest: Effort normal and breath sounds normal. No accessory muscle usage. No respiratory distress. She has no decreased breath sounds. She has no wheezes. She has no rhonchi. She has no rales.  Lymphadenopathy:    She has no cervical adenopathy.  Skin: She is not diaphoretic.   Diabetic Foot Form - Detailed   Diabetic Foot Exam - detailed Diabetic Foot exam was performed with the following findings:  Yes 11/29/2016  3:32 PM  Visual Foot Exam completed.:  Yes  Are the toenails ingrown?:  No Pulse Foot Exam completed.:  Yes  Right Dorsalis Pedis:  Present Left Dorsalis Pedis:  Present  Sensory  Foot Exam Completed.:  Yes Swelling:  No Semmes-Weinstein Monofilament Test R Site 1-Great Toe:  Pos L Site 1-Great Toe:  Pos  R Site 4:  Pos L Site 4:  Pos  R Site 5:  Pos L Site 5:  Pos        Results for orders placed or performed in visit on 07/04/16  Lipid Profile  Result Value Ref Range   Cholesterol 213 (H) <200 mg/dL   Triglycerides 92 <161 mg/dL   HDL 65 >09 mg/dL   Total CHOL/HDL Ratio 3.3 <5.0 Ratio   VLDL 18 <30 mg/dL   LDL Cholesterol 604 (H) <100 mg/dL  COMPLETE METABOLIC PANEL WITH GFR  Result Value Ref Range   Sodium 143 135 - 146 mmol/L   Potassium 4.1 3.5 - 5.3 mmol/L   Chloride 105 98 - 110 mmol/L   CO2 31 20 - 31 mmol/L   Glucose, Bld 107 (H) 65 - 99 mg/dL   BUN 14 7 - 25 mg/dL   Creat 5.40 9.81 - 1.91 mg/dL   Total Bilirubin 0.4 0.2 - 1.2 mg/dL   Alkaline Phosphatase 87 33 - 130 U/L   AST 29 10 - 35 U/L   ALT 30 (H) 6 - 29 U/L   Total Protein 6.9 6.1 - 8.1 g/dL   Albumin 4.2 3.6 - 5.1 g/dL   Calcium 9.8 8.6 - 47.8 mg/dL   GFR, Est African American >89 >=60 mL/min   GFR, Est Non African American 87 >=60 mL/min  POCT HgB A1C  Result Value Ref Range   Hemoglobin A1C 8.3   POCT Glucose (CBG)  Result Value Ref Range   POC Glucose 120 (A) 70 - 99 mg/dl  POCT UA - Microalbumin  Result Value Ref Range   Microalbumin Ur, POC 20 mg/L   Creatinine, POC  mg/dL   Albumin/Creatinine Ratio, Urine, POC        Assessment & Plan:   Problem List Items Addressed This Visit      Endocrine   Diabetes mellitus type 2, uncontrolled, without complications (HCC)    Discussed injectables as option to help control diabetes; patient will consider; no thyroid cancer, no MEN-2 fam hx; return for labs and discussion (will get A1c in-house at f/u)        Other   Insomnia    Hopefully, will be short-lived; will Rx trazodone since she is asking for something to help her sleep       Other Visit Diagnoses    Acute recurrent pansinusitis    -  Primary  will change  antibiotic; warned of risk of C diff; see AVS; she declined prednisone; will switch nasal corticsteorid   Relevant Medications   levofloxacin (LEVAQUIN) 500 MG tablet   mometasone (NASONEX) 50 MCG/ACT nasal spray   Screening for breast cancer       Relevant Orders   MM Digital Screening      Follow up plan: Return in about 3 weeks (around 12/20/2016) for twenty minute follow-up with fasting labs.  An after-visit summary was printed and given to the patient at check-out.  Please see the patient instructions which may contain other information and recommendations beyond what is mentioned above in the assessment and plan.  Meds ordered this encounter  Medications  . levofloxacin (LEVAQUIN) 500 MG tablet    Sig: Take 1 tablet (500 mg total) by mouth daily.    Dispense:  7 tablet    Refill:  0  . ibuprofen (ADVIL,MOTRIN) 800 MG tablet    Sig: Take 1 tablet (800 mg total) by mouth every 8 (eight) hours as needed.    Dispense:  30 tablet    Refill:  0  . traZODone (DESYREL) 50 MG tablet    Sig: Take 0.5-1 tablets (25-50 mg total) by mouth at bedtime as needed for sleep.    Dispense:  30 tablet    Refill:  3  . mometasone (NASONEX) 50 MCG/ACT nasal spray    Sig: Place 2 sprays into the nose daily.    Dispense:  17 g    Refill:  12    Orders Placed This Encounter  Procedures  . MM Digital Screening

## 2016-11-30 ENCOUNTER — Other Ambulatory Visit: Payer: Self-pay | Admitting: Family Medicine

## 2016-11-30 ENCOUNTER — Encounter: Payer: Self-pay | Admitting: *Deleted

## 2016-11-30 MED ORDER — FLUNISOLIDE 25 MCG/ACT (0.025%) NA SOLN: 2.0000 | Freq: Two times a day (BID) | NASAL | 0 refills | Status: DC

## 2016-11-30 NOTE — Progress Notes (Signed)
nasonex not covered by insurance; new Rx sent

## 2016-12-11 ENCOUNTER — Other Ambulatory Visit: Payer: Self-pay | Admitting: Family Medicine

## 2016-12-19 DIAGNOSIS — M5136 Other intervertebral disc degeneration, lumbar region: Secondary | ICD-10-CM | POA: Diagnosis not present

## 2016-12-19 DIAGNOSIS — M961 Postlaminectomy syndrome, not elsewhere classified: Secondary | ICD-10-CM | POA: Diagnosis not present

## 2016-12-19 DIAGNOSIS — Z79891 Long term (current) use of opiate analgesic: Secondary | ICD-10-CM | POA: Diagnosis not present

## 2016-12-19 DIAGNOSIS — M542 Cervicalgia: Secondary | ICD-10-CM | POA: Diagnosis not present

## 2016-12-20 ENCOUNTER — Other Ambulatory Visit: Payer: Self-pay | Admitting: Family Medicine

## 2016-12-20 DIAGNOSIS — E7800 Pure hypercholesterolemia, unspecified: Secondary | ICD-10-CM

## 2016-12-20 MED ORDER — ATORVASTATIN CALCIUM 40 MG PO TABS: 40.0000 mg | ORAL_TABLET | Freq: Every day | ORAL | 0 refills | Status: DC

## 2016-12-20 NOTE — Telephone Encounter (Signed)
Pt has switched care to you and is requesting refill on atorvastatin she is completely out. Please send to [email or web address].

## 2016-12-27 DIAGNOSIS — H524 Presbyopia: Secondary | ICD-10-CM | POA: Diagnosis not present

## 2016-12-27 DIAGNOSIS — Z0100 Encounter for examination of eyes and vision without abnormal findings: Secondary | ICD-10-CM | POA: Diagnosis not present

## 2016-12-27 LAB — HM DIABETES EYE EXAM

## 2016-12-28 ENCOUNTER — Telehealth: Payer: Self-pay | Admitting: Family Medicine

## 2016-12-28 NOTE — Telephone Encounter (Signed)
Pt is asking for a refill on the cough syrup states she can not stop coughing especially at night. Please send to [facility] [phone]

## 2016-12-29 ENCOUNTER — Other Ambulatory Visit: Payer: Self-pay | Admitting: *Deleted

## 2016-12-29 ENCOUNTER — Other Ambulatory Visit: Payer: Self-pay

## 2016-12-29 MED ORDER — TRAZODONE HCL 50 MG PO TABS: 25.0000 mg | ORAL_TABLET | Freq: Every evening | ORAL | 1 refills | Status: DC | PRN

## 2016-12-29 NOTE — Telephone Encounter (Signed)
Ins requiring 90 day supply? 

## 2016-12-29 NOTE — Patient Outreach (Signed)
[facility]) Care Management  12/29/2016  Lori Potts June 14, 1946 [id number]    RN Health Coach telephone call to patient.  Hipaa compliance verified. Per patient she is on her way to the doctor now.  Plan: RN will call patient again within 14 days.   [name] Cy Bresee BSN RN [facility] Care Management [phone]

## 2016-12-29 NOTE — Telephone Encounter (Signed)
rx approved

## 2016-12-29 NOTE — Telephone Encounter (Signed)
Pt scheduled  

## 2016-12-29 NOTE — Telephone Encounter (Signed)
Her last visit was one month ago; if she is still coughing, please put her on the schedule tomorrow and get pre- and post-spirometry before I see her; I don't think a cough syrup is a good idea for her, with her other medicines and age; let's figure out what's wrong and address that; I look forward to seeing her tomorrow

## 2016-12-30 ENCOUNTER — Ambulatory Visit: Payer: Medicare HMO | Admitting: Family Medicine

## 2017-01-12 ENCOUNTER — Other Ambulatory Visit: Payer: Self-pay | Admitting: *Deleted

## 2017-01-12 NOTE — Patient Outreach (Signed)
[facility]) Care Management  01/12/2017  Lori Potts 14-Sep-1945 [id number]    RN Health Coach attempted #1  Follow up outreach call to patient.  Patient was unavailable.No voicemail message pick up. Plan: RN will call patient again within 14 days.  [name] [name] BSN RN [facility] Care Management [phone]

## 2017-01-18 DIAGNOSIS — M47816 Spondylosis without myelopathy or radiculopathy, lumbar region: Secondary | ICD-10-CM | POA: Diagnosis not present

## 2017-01-26 ENCOUNTER — Other Ambulatory Visit: Payer: Self-pay | Admitting: *Deleted

## 2017-01-26 NOTE — Patient Outreach (Signed)
[facility]) Care Management  01/26/2017   Lori Potts 1946/06/17 [id number] RN Health Coach telephone call to patient.  Hipaa compliance verified. Per patient she is still having some problems with her sinus. Patient stated her fasting blood sugar was 132. Patient A1C was 8.3 on 07/04/2016. Patient has been getting injections in her back. Per patient the steriodal shots makes her blood sugars go up. Patient stated she is walking around some for exercise but no routine exercise program. Patient I making better food choices. Patient is taking medications as per ordered. Patient has agreed to follow up outreach calls.    Current Medications:  Current Outpatient Prescriptions  Medication Sig Dispense Refill  . ACCU-CHEK FASTCLIX LANCETS MISC     . albuterol (PROVENTIL HFA;VENTOLIN HFA) 108 (90 Base) MCG/ACT inhaler Inhale 2 puffs into the lungs every 6 (six) hours as needed (for wheezing and shortness of breath from seasonal allergies). 1 Inhaler 1  . albuterol (PROVENTIL) (2.5 MG/3ML) 0.083% nebulizer solution     . atorvastatin (LIPITOR) 40 MG tablet Take 1 tablet (40 mg total) by mouth at bedtime. 90 tablet 0  . Blood Glucose Calibration (ACCU-CHEK SMARTVIEW CONTROL) LIQD     . Blood Glucose Monitoring Suppl (ACCU-CHEK NANO SMARTVIEW) w/Device KIT CHECK BLOOD SUGAR TWICE DAILY 1 kit 0  . flunisolide (NASALIDE) 25 MCG/ACT (0.025%) SOLN Place 2 sprays into the nose 2 (two) times daily. 1 Bottle 0  . Fluticasone-Salmeterol (ADVAIR DISKUS) 250-50 MCG/DOSE AEPB Inhale 1 puff into the lungs 2 (two) times daily. 14 each 1  . glucose blood (ACCU-CHEK SMARTVIEW) test strip TEST BLOOD SUGARS 4 TIMES DAILY 150 each PRN  . ibuprofen (ADVIL,MOTRIN) 800 MG tablet Take 1 tablet (800 mg total) by mouth every 8 (eight) hours as needed. 30 tablet 0  . Insulin Detemir (LEVEMIR FLEXTOUCH) 100 UNIT/ML Pen Inject 20 Units into the skin daily at 10 pm. 4 pen 1  . Insulin Pen Needle (BD  ULTRA-FINE PEN NEEDLES) 29G X 12.7MM MISC 1 pen by Does not apply route once. 100 each 3  . ipratropium-albuterol (DUONEB) 0.5-2.5 (3) MG/3ML SOLN TAKE 3 MLS BY NEBULIZATION EVERY 4 (FOUR) HOURS AS NEEDED. 360 mL 3  . lisinopril-hydrochlorothiazide (PRINZIDE,ZESTORETIC) 20-25 MG tablet Take 1 tablet by mouth daily. 90 tablet 1  . metaxalone (SKELAXIN) 800 MG tablet Take 1 tablet (800 mg total) by mouth 3 (three) times daily. (Patient not taking: Reported on 11/29/2016) 30 tablet 0  . montelukast (SINGULAIR) 10 MG tablet TAKE 1 TABLET EVERY DAY 90 tablet 1  . ondansetron (ZOFRAN) 4 MG tablet TAKE 1 TABLET EVERY 8 HOURS AS NEEDED FOR NAUSEA OR VOMITING 20 tablet 0  . oxyCODONE-acetaminophen (PERCOCET) 10-325 MG tablet Take 1 tablet by mouth every 4 (four) hours as needed for pain.    . ranitidine (ZANTAC) 300 MG tablet TAKE 1 TABLET (300 MG TOTAL) BY MOUTH AT BEDTIME. 30 tablet 6  . traZODone (DESYREL) 50 MG tablet Take 0.5-1 tablets (25-50 mg total) by mouth at bedtime as needed for sleep. 90 tablet 1   No current facility-administered medications for this visit.     Functional Status:  In your present state of health, do you have any difficulty performing the following activities: 01/26/2017 11/29/2016  Hearing? N N  Vision? N Y  Difficulty concentrating or making decisions? N N  Walking or climbing stairs? Y N  Dressing or bathing? N N  Doing errands, shopping? N N  Preparing Food and eating ? N -  Using the Toilet? N -  In the past six months, have you accidently leaked urine? Y -  Do you have problems with loss of bowel control? N -  Managing your Medications? N -  Managing your Finances? N -  Housekeeping or managing your Housekeeping? N -  Some recent data might be hidden    Fall/Depression Screening: Fall Risk  01/26/2017 11/29/2016 11/29/2016  Falls in the past year? Yes Yes Yes  Number falls in past yr: '1 1 1  '$ Injury with Fall? No No No  Risk for fall due to : Impaired  balance/gait;History of fall(s);Impaired mobility - Impaired balance/gait;History of fall(s)  Follow up Falls evaluation completed;Falls prevention discussed;Education provided Falls evaluation completed Falls evaluation completed;Education provided;Falls prevention discussed   PHQ 2/9 Scores 01/26/2017 11/29/2016 11/29/2016 10/20/2016 09/30/2016 09/06/2016 07/13/2016  PHQ - 2 Score 0 0 0 0 0 0 0   [facility] CM Care Plan Problem One     Most Recent Value  Care Plan Problem One  Knowledge Deficit in Self Management of Diabetes  Role Documenting the Problem One  [place] for Problem One  Active  [facility] Long Term Goal   Patient will see A1C decrease with next blood draw within the next 90 days  [facility] Long Term Goal Start Date  01/26/17  Interventions for Problem One Long Term Goal  RN discussed A1C goals. RN discussed fasting blood glucose range needs to be 80- 130 to see the A1C range < 7  [facility] CM Short Term Goal #2   Patient will report developing a routine exercise program within the next 30 days  [facility] CM Short Term Goal #2 Start Date  01/26/17  Interventions for Short Term Goal #2  RN discussed with patient about doing chair exercises. RN sent a picture chart of chair exercises. RN will follow up with next outreach  Interventions for Short Tern Goal #3  RN sent patient a list of healthy snacks to eat. RN discussed as patient shopping to pick up some of the items as her snack. RN willfollow up with discussion and teachback.  [facility] CM Short Term Goal #5   Patient will understand what the results of lipid panel means within the next 30 days  [facility] CM Short Term Goal #5 Start Date  01/26/17  [facility] CM Short Term Goal #5 Met Date  01/26/17     Assessment:  Patient is adhering to medications Patient is not exercising Patient will continue to benefit from Health Coach telephonic outreach for education and support for diabetes self management. Plan:  RN discussed routine exercise RN will send picture chart  of chair exercises RN discussed eating healthy RN discussed A1C goals and how to make progress RN will follow up within the month of July  Lori Potts [place] Management [phone]

## 2017-01-27 ENCOUNTER — Encounter: Payer: Self-pay | Admitting: *Deleted

## 2017-02-03 ENCOUNTER — Ambulatory Visit (INDEPENDENT_AMBULATORY_CARE_PROVIDER_SITE_OTHER): Payer: Medicare HMO | Admitting: Family Medicine

## 2017-02-03 ENCOUNTER — Encounter: Payer: Self-pay | Admitting: Family Medicine

## 2017-02-03 VITALS — BP 136/78 | HR 87 | Temp 98.4°F | Resp 14 | Wt 179.3 lb

## 2017-02-03 DIAGNOSIS — J329 Chronic sinusitis, unspecified: Secondary | ICD-10-CM | POA: Diagnosis not present

## 2017-02-03 DIAGNOSIS — R51 Headache: Secondary | ICD-10-CM

## 2017-02-03 DIAGNOSIS — G8929 Other chronic pain: Secondary | ICD-10-CM

## 2017-02-03 DIAGNOSIS — R0789 Other chest pain: Secondary | ICD-10-CM | POA: Diagnosis not present

## 2017-02-03 DIAGNOSIS — Z794 Long term (current) use of insulin: Secondary | ICD-10-CM

## 2017-02-03 DIAGNOSIS — IMO0001 Reserved for inherently not codable concepts without codable children: Secondary | ICD-10-CM

## 2017-02-03 DIAGNOSIS — E1165 Type 2 diabetes mellitus with hyperglycemia: Secondary | ICD-10-CM

## 2017-02-03 MED ORDER — INSULIN DEGLUDEC-LIRAGLUTIDE 100-3.6 UNIT-MG/ML ~~LOC~~ SOPN: 16.0000 [IU] | PEN_INJECTOR | Freq: Every day | SUBCUTANEOUS | 5 refills | Status: DC

## 2017-02-03 MED ORDER — OMEPRAZOLE 20 MG PO CPDR: 20.0000 mg | DELAYED_RELEASE_CAPSULE | Freq: Two times a day (BID) | ORAL | 0 refills | Status: DC

## 2017-02-03 MED ORDER — BUTALBITAL-ACETAMINOPHEN 50-300 MG PO TABS: ORAL_TABLET | ORAL | 0 refills | Status: DC

## 2017-02-03 NOTE — Progress Notes (Signed)
BP 136/78   Pulse 87   Temp 98.4 F (36.9 C) (Oral)   Resp 14   Wt 179 lb 4.8 oz (81.3 kg)   SpO2 97%   BMI 31.76 kg/m    Subjective:    Patient ID: Lori ChattersMary H Potts, female    DOB: [birth year], 71 y.o.   MRN: [id number]  HPI: Lori Potts is a 71 y.o. female  Chief Complaint  Patient presents with  . Sinus Problem    years   HPI She has new glasses, not doing any good Having headaches in the right forehead Had headache pain and sudden, not jack-hammer like Not like an ice pick Dull headache Pain over the right frontal area and behind the bridge of the nose Blows her nose all the time; knows it's her sinuses No allergies to abx Left ear bothering No sore throat, sometimes just scratchy No fevers Has augmentin in med list 6 times over last 2 years, 3 rounds of z-pak, 2 rounds of levaquin, 1 round of avelox Can't handle the pain from the sinuses, getting ill Tried everything  She has chest pain, can't put hand directly on it; sharp pains coming from her back too Happened this morning Sometimes when she wakes up, her breath gets short No hx of personal heart disease, but mother had CHF; no one else in the family  She has seen a cardiologist, Dr. Juliann Paresallwood She saw him last year for jaw pain; had cardiac cath, copied below: Conclusion    The left ventricular systolic function is normal.  ejection fraction of 60% with normal wall motion  normal coronaries no significant obstruction   Saw a pulmononologist last years and everything was fine; Jan 2017; note reviewed  Stuff coming up and burning; taking  Type 2 diabetes Lab Results  Component Value Date   HGBA1C 8.3 07/04/2016   Got a steroid shot about 3 weeks, 149 No MEN-2 or thyroid cancer  [facility] 02/03/2017 01/26/2017 11/29/2016 11/29/2016 10/20/2016  Decreased Interest 0 0 0 0 0  Down, Depressed, Hopeless 0 0 0 0 0  PHQ - 2 Score 0 0 0 0 0    Relevant past medical, surgical, family  and social history reviewed Past Medical History:  Diagnosis Date  . Arthritis   . Cataract    right eye but immature  . Essential hypertension, benign    takes Lisinopril-HCTZ daily  . GERD (gastroesophageal reflux disease)   . History of colon polyps    benign  . History of migraine   . History of shingles   . Hyperlipidemia    takes Lipitor daily  . Low back pain   . Nocturia   . Seasonal allergies    takes Singulair daily as needed  . Type II or unspecified type diabetes mellitus without mention of complication, not stated as uncontrolled   . Vitamin D deficiency    takes Vit D weekly  . Weakness    numbness and tingling left arm   Past Surgical History:  Procedure Laterality Date  . ABDOMINAL HYSTERECTOMY    . ANTERIOR CERVICAL DECOMP/DISCECTOMY FUSION N/A 02/26/2015   Procedure: ANTERIOR CERVICAL DISCECTOMY FUSION C4-5 (1 LEVEL);  Surgeon: Venita Lickahari Brooks, MD;  [facility];  Service: Orthopedics;  Laterality: N/A;  . ANTERIOR LAT LUMBAR FUSION N/A 03/21/2013   Procedure: ANTERIOR LATERAL LUMBAR FUSION 1 LEVEL/ XLIF L3-L4 ;  Surgeon: Venita Lickahari Brooks, MD;  Location: [facility] OR;  Service: Orthopedics;  Laterality: N/A;  .  APPENDECTOMY    . BACK SURGERY    . BACK SURGERY     Lumbar fusion x 2  . BREAST SURGERY  1990   Augementation  . CARDIAC CATHETERIZATION  5/12   ef 55%  . CARDIAC CATHETERIZATION  10/2010   [facility]; EF 55%  . CARDIAC CATHETERIZATION Left 02/16/2016   Procedure: Left Heart Cath and Coronary Angiography;  Surgeon: Alwyn Pea, MD;  Location: [facility] INVASIVE CV LAB;  Service: Cardiovascular;  Laterality: Left;  . COLONOSCOPY    . ESOPHAGOGASTRODUODENOSCOPY    . KIDNEY SURGERY  1998   growth removed from left kidney   . pain stimulator    . POSTERIOR CERVICAL FUSION/FORAMINOTOMY Right 03/21/2013   Procedure: POSTERIOR L2-3 RIGHT FORAMINOTOMY;  Surgeon: Venita Lick, MD;  Location: [facility] OR;  Service: Orthopedics;  Laterality: Right;  . REVISION OF SCAR TISSUE  RECTUS MUSCLE    . SMALL BOWEL REPAIR    . SPINAL CORD STIMULATOR BATTERY EXCHANGE N/A 10/17/2012   Procedure: SPINAL CORD STIMULATOR BATTERY REMOVAL;  Surgeon: Venita Lick, MD;  Location: [facility] OR;  Service: Orthopedics;  Laterality: N/A;  . SPINAL CORD STIMULATOR BATTERY EXCHANGE N/A 07/22/2015   Procedure: REIMPLANTATION OF SPINAL CORD STIMULATOR BATTERY ;  Surgeon: Venita Lick, MD;  Location: [facility] OR;  Service: Orthopedics;  Laterality: N/A;  . TOTAL KNEE ARTHROPLASTY Left    Family History  Problem Relation Age of Onset  . Heart attack Mother   . Sarcoidosis Sister    Social History   Social History  . Marital status: Married    Spouse name: N/A  . Number of children: N/A  . Years of education: N/A   Occupational History  . Not on file.   Social History Main Topics  . Smoking status: Former Smoker    Types: Cigarettes  . Smokeless tobacco: Never Used     Comment: quit smoking 29yrs ago  . Alcohol use No  . Drug use: No  . Sexual activity: Yes    Birth control/ protection: Surgical   Other Topics Concern  . Not on file   Social History Narrative   ** Merged History Encounter **        Interim medical history since last visit reviewed. Allergies and medications reviewed  Review of Systems Per HPI unless specifically indicated above     Objective:    BP 136/78   Pulse 87   Temp 98.4 F (36.9 C) (Oral)   Resp 14   Wt 179 lb 4.8 oz (81.3 kg)   SpO2 97%   BMI 31.76 kg/m   Wt Readings from Last 3 Encounters:  02/03/17 179 lb 4.8 oz (81.3 kg)  11/29/16 181 lb (82.1 kg)  11/21/16 181 lb 4.8 oz (82.2 kg)    Physical Exam  Constitutional: She appears well-developed and well-nourished. No distress.  HENT:  Head: Normocephalic and atraumatic.  Eyes: EOM are normal. No scleral icterus.  Neck: No thyromegaly present.  Cardiovascular: Normal rate, regular rhythm and normal heart sounds.   No murmur heard. Pulmonary/Chest: Effort normal and breath sounds  normal. No respiratory distress. She has no wheezes.  Abdominal: Soft. Bowel sounds are normal. She exhibits no distension.  Musculoskeletal: Normal range of motion. She exhibits no edema.  Neurological: She is alert. She exhibits normal muscle tone.  Skin: Skin is warm and dry. She is not diaphoretic. No pallor.  Psychiatric: She has a normal mood and affect. Her behavior is normal. Judgment and thought content normal.  Diabetic Foot Form - Detailed   Diabetic Foot Exam - detailed Diabetic Foot exam was performed with the following findings:  Yes 02/03/2017  4:15 PM  Visual Foot Exam completed.:  Yes  Can the patient see the bottom of their feet?:  Yes Are the shoes appropriate in style and fit?:  No Pulse Foot Exam completed.:  Yes  Right Dorsalis Pedis:  Present Left Dorsalis Pedis:  Present  Sensory Foot Exam Completed.:  Yes Semmes-Weinstein Monofilament Test R Site 1-Great Toe:  Pos L Site 1-Great Toe:  Pos        Results for orders placed or performed in visit on 12/29/16  HM DIABETES EYE EXAM  Result Value Ref Range   HM Diabetic Eye Exam No Retinopathy No Retinopathy      Assessment & Plan:   Problem List Items Addressed This Visit      Endocrine   Diabetes mellitus type 2, uncontrolled, without complications (HCC)    Discussed changing up therapy and she agrees to try another agent, combination insulin plus GLP-1; monitor sugars; notify me of any problems; reasons to seek immediate care reviewed        Other   Chest pain    Already worked up with cardiologist and pulmonologist; she has discrete discomfort in the thoracic spine on palpation; discussed PM&R, chiropractic       Other Visit Diagnoses    Chronic recurrent sinusitis    -  Primary   Relevant Orders   Ambulatory referral to ENT   Chronic nonintractable headache, unspecified headache type       discussed ddx, will try butalbital for symptoms   Relevant Medications   Butalbital-Acetaminophen 50-300  MG TABS       Follow up plan: Return in about 3 weeks (around 02/21/2017) for twenty minute follow-up with fasting labs.  An after-visit summary was printed and given to the patient at check-out.  Please see the patient instructions which may contain other information and recommendations beyond what is mentioned above in the assessment and plan.  Meds ordered this encounter  Medications  . DISCONTD: insulin detemir (LEVEMIR) 100 UNIT/ML injection    Sig: Inject 20 Units into the skin at bedtime.  Marland Kitchen omeprazole (PRILOSEC) 20 MG capsule    Sig: Take 1 capsule (20 mg total) by mouth 2 (two) times daily before a meal.    Dispense:  60 capsule    Refill:  0  . DISCONTD: Insulin Degludec-Liraglutide (XULTOPHY) 100-3.6 UNIT-MG/ML SOPN    Sig: Inject 16 Units into the skin daily. (this replaces the insulin)    Dispense:  5 pen    Refill:  5  . Butalbital-Acetaminophen 50-300 MG TABS    Sig: One by mouth every six hours if needed for headache    Dispense:  20 tablet    Refill:  0    Orders Placed This Encounter  Procedures  . Ambulatory referral to ENT

## 2017-02-03 NOTE — Patient Instructions (Addendum)
Caution: prolonged use of proton pump inhibitors like omeprazole (Prilosec), pantoprazole (Protonix), esomeprazole (Nexium), and others like Dexilant and Aciphex may increase your risk of pneumonia, Clostridium difficile colitis, osteoporosis, anemia and other health complications Try to limit or avoid triggers like coffee, caffeinated beverages, onions, chocolate, spicy foods, peppermint, acid foods like pizza, spaghetti sauce, and orange juice Lose weight if you are overweight or obese Try elevating the head of your bed by placing a small wedge between your mattress and box springs to keep acid in the stomach at night instead of coming up into your esophagus Do not eat for 3 hours before bed  Stop the insulin and start Xultophy Start with 16 units once a day, call next week with sugar readings  We'll have you see the Ear Nose Throat doctor Try turmeric as a natural anti-inflammatory (for pain and arthritis). It comes in capsules where you buy aspirin and fish oil, but also as a spice where you buy pepper and garlic powder.

## 2017-02-06 ENCOUNTER — Telehealth: Payer: Self-pay

## 2017-02-06 ENCOUNTER — Other Ambulatory Visit: Payer: Self-pay

## 2017-02-06 MED ORDER — INSULIN DETEMIR 100 UNIT/ML ~~LOC~~ SOLN: 20.0000 [IU] | Freq: Every day | SUBCUTANEOUS | 11 refills | Status: DC

## 2017-02-06 MED ORDER — SEMAGLUTIDE(0.25 OR 0.5MG/DOS) 2 MG/1.5ML ~~LOC~~ SOPN: 0.2500 mg | PEN_INJECTOR | SUBCUTANEOUS | 0 refills | Status: AC

## 2017-02-06 NOTE — Telephone Encounter (Signed)
Left detailed voicemail and to call back with any questions or concerns. 

## 2017-02-06 NOTE — Telephone Encounter (Signed)
Pt Xultophy is to expensive for her to get. Pt states her last injection of insulin  was  three days ago. Pt sugars is reading 342. Pt having excessive urination and fatigue. Pt asked do we have a sample of Xultophy, Mention to pt we do. Pt will come up and get a sample box of Xultophy so she can get her sugars down.  I called the phamarcy to get a better understanding. Pharm tech stated her insurance is asking the pt to try other insulin before they pay for Xultophy. I asked the her to give me a couple of the brands that they will pay for:  Trulicity levmi bydureon pen byetta ozempic lantus insulin pen toujeo

## 2017-02-06 NOTE — Assessment & Plan Note (Signed)
Discussed changing up therapy and she agrees to try another agent, combination insulin plus GLP-1; monitor sugars; notify me of any problems; reasons to seek immediate care reviewed

## 2017-02-06 NOTE — Telephone Encounter (Signed)
Thank you for letting me know We'll have her go back on Levemir 20 units once a day We'll ADD Ozempic once a week (this is not insulin, but another shot in addition to insulin) She do 0.25 mg of Ozempic once a week for four weeks, then 0.5 mg once a week Call us if any problems before her July 9th appointment Thank you

## 2017-02-06 NOTE — Assessment & Plan Note (Signed)
Already worked up with cardiologist and pulmonologist; she has discrete discomfort in the thoracic spine on palpation; discussed PM&R, chiropractic

## 2017-02-08 ENCOUNTER — Telehealth: Payer: Self-pay

## 2017-02-08 NOTE — Telephone Encounter (Signed)
Patient called states she is on a fixed income and cannot afford the migraine med butalbital.  Is there something cheaper, please advise?

## 2017-02-08 NOTE — Telephone Encounter (Signed)
Yes, we'll have her try an over-the-counter cocktail of CoQ 10 (any strength, follow package directions), vitamin B1 (any strength, follow package directions), and magnesium oxide 400 mg daily

## 2017-02-09 NOTE — Telephone Encounter (Signed)
Patient notified

## 2017-02-10 ENCOUNTER — Encounter: Payer: Self-pay | Admitting: Emergency Medicine

## 2017-02-10 ENCOUNTER — Encounter: Payer: Self-pay | Admitting: Family Medicine

## 2017-02-10 ENCOUNTER — Ambulatory Visit (INDEPENDENT_AMBULATORY_CARE_PROVIDER_SITE_OTHER): Payer: Medicare HMO | Admitting: Family Medicine

## 2017-02-10 ENCOUNTER — Emergency Department
Admission: EM | Admit: 2017-02-10 | Discharge: 2017-02-10 | Disposition: A | Discharge status: Home / Self Care | Payer: Medicare HMO | Attending: Emergency Medicine | Admitting: Emergency Medicine

## 2017-02-10 ENCOUNTER — Emergency Department: Payer: Medicare HMO

## 2017-02-10 VITALS — BP 132/78 | HR 87 | Temp 97.4°F | Resp 16 | Ht 63.0 in | Wt 176.6 lb

## 2017-02-10 DIAGNOSIS — Z96651 Presence of right artificial knee joint: Secondary | ICD-10-CM | POA: Insufficient documentation

## 2017-02-10 DIAGNOSIS — R195 Other fecal abnormalities: Secondary | ICD-10-CM | POA: Diagnosis not present

## 2017-02-10 DIAGNOSIS — Z87891 Personal history of nicotine dependence: Secondary | ICD-10-CM | POA: Diagnosis not present

## 2017-02-10 DIAGNOSIS — Z794 Long term (current) use of insulin: Secondary | ICD-10-CM | POA: Insufficient documentation

## 2017-02-10 DIAGNOSIS — K5730 Diverticulosis of large intestine without perforation or abscess without bleeding: Secondary | ICD-10-CM | POA: Diagnosis not present

## 2017-02-10 DIAGNOSIS — K5792 Diverticulitis of intestine, part unspecified, without perforation or abscess without bleeding: Secondary | ICD-10-CM

## 2017-02-10 DIAGNOSIS — K5732 Diverticulitis of large intestine without perforation or abscess without bleeding: Secondary | ICD-10-CM | POA: Diagnosis not present

## 2017-02-10 DIAGNOSIS — E119 Type 2 diabetes mellitus without complications: Secondary | ICD-10-CM | POA: Diagnosis not present

## 2017-02-10 DIAGNOSIS — Z7982 Long term (current) use of aspirin: Secondary | ICD-10-CM | POA: Insufficient documentation

## 2017-02-10 DIAGNOSIS — I10 Essential (primary) hypertension: Secondary | ICD-10-CM | POA: Diagnosis not present

## 2017-02-10 DIAGNOSIS — Z79899 Other long term (current) drug therapy: Secondary | ICD-10-CM | POA: Insufficient documentation

## 2017-02-10 DIAGNOSIS — K5900 Constipation, unspecified: Secondary | ICD-10-CM | POA: Insufficient documentation

## 2017-02-10 DIAGNOSIS — R1084 Generalized abdominal pain: Secondary | ICD-10-CM | POA: Diagnosis present

## 2017-02-10 DIAGNOSIS — Z791 Long term (current) use of non-steroidal anti-inflammatories (NSAID): Secondary | ICD-10-CM | POA: Insufficient documentation

## 2017-02-10 DIAGNOSIS — K5790 Diverticulosis of intestine, part unspecified, without perforation or abscess without bleeding: Secondary | ICD-10-CM | POA: Diagnosis not present

## 2017-02-10 DIAGNOSIS — T402X5A Adverse effect of other opioids, initial encounter: Secondary | ICD-10-CM | POA: Insufficient documentation

## 2017-02-10 DIAGNOSIS — K5903 Drug induced constipation: Secondary | ICD-10-CM

## 2017-02-10 LAB — DIFFERENTIAL
Basophils Absolute: 0.1 10*3/uL (ref 0–0.1)
Basophils Relative: 1 %
Eosinophils Absolute: 0.1 10*3/uL (ref 0–0.7)
Eosinophils Relative: 1 %
Lymphocytes Relative: 25 %
Lymphs Abs: 1.3 10*3/uL (ref 1.0–3.6)
Monocytes Absolute: 0.3 10*3/uL (ref 0.2–0.9)
Monocytes Relative: 6 %
Neutro Abs: 3.5 10*3/uL (ref 1.4–6.5)
Neutrophils Relative %: 67 %

## 2017-02-10 LAB — COMPREHENSIVE METABOLIC PANEL
ALT: 15 U/L (ref 14–54)
AST: 20 U/L (ref 15–41)
Albumin: 4 g/dL (ref 3.5–5.0)
Alkaline Phosphatase: 104 U/L (ref 38–126)
Anion gap: 8 (ref 5–15)
BUN: 17 mg/dL (ref 6–20)
CO2: 25 mmol/L (ref 22–32)
Calcium: 9.7 mg/dL (ref 8.9–10.3)
Chloride: 105 mmol/L (ref 101–111)
Creatinine, Ser: 0.64 mg/dL (ref 0.44–1.00)
GFR calc Af Amer: >60 mL/min (ref >60)
GFR calc non Af Amer: >60 mL/min (ref >60)
Glucose, Bld: 89 mg/dL (ref 65–99)
Potassium: 3.3 mmol/L — ABNORMAL LOW (ref 3.5–5.1)
Sodium: 138 mmol/L (ref 135–145)
Total Bilirubin: 0.6 mg/dL (ref 0.3–1.2)
Total Protein: 7.6 g/dL (ref 6.5–8.1)

## 2017-02-10 LAB — CBC
HCT: 35.2 % (ref 35.0–47.0)
Hemoglobin: 12 g/dL (ref 12.0–16.0)
MCH: 26.4 pg (ref 26.0–34.0)
MCHC: 34.2 g/dL (ref 32.0–36.0)
MCV: 77.2 fL — ABNORMAL LOW (ref 80.0–100.0)
Platelets: 310 10*3/uL (ref 150–440)
RBC: 4.55 MIL/uL (ref 3.80–5.20)
RDW: 14.7 % — ABNORMAL HIGH (ref 11.5–14.5)
WBC: 5.3 10*3/uL (ref 3.6–11.0)

## 2017-02-10 LAB — URINALYSIS, COMPLETE (UACMP) WITH MICROSCOPIC
Bacteria, UA: NONE SEEN
Bilirubin Urine: NEGATIVE
Glucose, UA: NEGATIVE mg/dL
Hgb urine dipstick: NEGATIVE
Ketones, ur: NEGATIVE mg/dL
Leukocytes, UA: NEGATIVE
Nitrite: NEGATIVE
Protein, ur: NEGATIVE mg/dL
RBC / HPF: NONE SEEN RBC/hpf (ref 0–5)
Specific Gravity, Urine: 1.012 (ref 1.005–1.030)
pH: 6 (ref 5.0–8.0)

## 2017-02-10 LAB — LIPASE, BLOOD: Lipase: 30 U/L (ref 11–51)

## 2017-02-10 MED ORDER — METRONIDAZOLE 500 MG PO TABS
500.0000 mg | ORAL_TABLET | Freq: Once | ORAL | Status: AC
  Administered 2017-02-10: 500 mg via ORAL
  Filled 2017-02-10: qty 1

## 2017-02-10 MED ORDER — ONDANSETRON HCL 4 MG/2ML IJ SOLN: 4.0000 mg | Freq: Once | INTRAMUSCULAR | Status: DC

## 2017-02-10 MED ORDER — METRONIDAZOLE 500 MG PO TABS: 500.0000 mg | ORAL_TABLET | Freq: Three times a day (TID) | ORAL | 0 refills | Status: DC

## 2017-02-10 MED ORDER — CIPROFLOXACIN HCL 500 MG PO TABS
500.0000 mg | ORAL_TABLET | Freq: Once | ORAL | Status: AC
  Administered 2017-02-10: 500 mg via ORAL
  Filled 2017-02-10: qty 1

## 2017-02-10 MED ORDER — SODIUM CHLORIDE 0.9 % IV BOLUS (SEPSIS): 1000.0000 mL | Freq: Once | INTRAVENOUS | Status: DC

## 2017-02-10 MED ORDER — ONDANSETRON HCL 4 MG/2ML IJ SOLN
4.0000 mg | Freq: Once | INTRAMUSCULAR | Status: AC
Start: 2017-02-10 — End: 2017-02-10
  Administered 2017-02-10: 4 mg via INTRAVENOUS
  Filled 2017-02-10: qty 2

## 2017-02-10 MED ORDER — MAGNESIUM CITRATE PO SOLN: 1.0000 | Freq: Once | ORAL | 0 refills | Status: AC

## 2017-02-10 MED ORDER — IOPAMIDOL (ISOVUE-300) INJECTION 61%
100.0000 mL | Freq: Once | INTRAVENOUS | Status: AC | PRN
  Administered 2017-02-10: 100 mL via INTRAVENOUS

## 2017-02-10 MED ORDER — POLYETHYLENE GLYCOL 3350 17 GM/SCOOP PO POWD: ORAL | 0 refills | Status: DC

## 2017-02-10 MED ORDER — LEVOFLOXACIN 500 MG PO TABS: 500.0000 mg | ORAL_TABLET | Freq: Every day | ORAL | 0 refills | Status: DC

## 2017-02-10 MED ORDER — MORPHINE SULFATE (PF) 4 MG/ML IV SOLN: 4.0000 mg | Freq: Once | INTRAVENOUS | Status: DC

## 2017-02-10 MED ORDER — MORPHINE SULFATE (PF) 4 MG/ML IV SOLN
4.0000 mg | Freq: Once | INTRAVENOUS | Status: AC
  Administered 2017-02-10: 4 mg via INTRAVENOUS
  Filled 2017-02-10: qty 1

## 2017-02-10 MED ORDER — SODIUM CHLORIDE 0.9 % IV BOLUS (SEPSIS)
1000.0000 mL | Freq: Once | INTRAVENOUS | Status: AC
Start: 2017-02-10 — End: 2017-02-10
  Administered 2017-02-10: 1000 mL via INTRAVENOUS

## 2017-02-10 MED ORDER — SENNOSIDES-DOCUSATE SODIUM 8.6-50 MG PO TABS: 2.0000 | ORAL_TABLET | Freq: Two times a day (BID) | ORAL | 0 refills | Status: DC

## 2017-02-10 NOTE — ED Triage Notes (Signed)
Pt to ED c/o constipation since Sunday.  States lower mid abd pressure.  States has not had BM since Sunday.  Has had mucous stools for couple days.  States using stool softeners, laxatives, and enemas at home without relief.

## 2017-02-10 NOTE — Progress Notes (Addendum)
Name: Lori Potts   MRN: [id number]    DOB: Dec 25, 1945   Date:02/10/2017       Progress Note  Subjective  Chief Complaint  Chief Complaint  Patient presents with  . Constipation    HPI  Pt reports constipation since Sunday. She had one very small hard bowel movement 2 days ago, otherwise nothing but mucus when she tries to have a BM. She has BLQ pain, significant nausea but no vomiting and still passing occasional gas, no fevers/chills, no urinary symptoms, no hemorrhoids or rectal pain. Has history of blockage in 1986 and is concerned about this today because this feels very similar to that episode. Last colonoscopy was 9 years ago and was told to return in 10 years.  Has very decreased appetite but has been drinking plenty of fluids. Has tried Fleet enema and took a PO laxative 3-4 days ago without any relief.  Patient Active Problem List   Diagnosis Date Noted  . Insomnia 11/29/2016  . 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 Vasomotor flushing 04/19/2016  . Abdominal wall pain in right flank 03/29/2016  . Diverticulosis 02/24/2016  . Hypercholesterolemia 06/16/2015  . FHx: migraine headaches 04/13/2015  . Chronic pain 04/13/2015  . Neck pain 02/26/2015  . Status post lumbar surgery 02/26/2015  . Abnormal ECG 01/27/2015  . Angina pectoris ([place]) 01/27/2015  . Chest pain 01/27/2015  . Diabetes mellitus type 2, uncontrolled, without complications ([place]) 08/65/7846  . BP (high blood pressure) 01/27/2015    Social History  Substance Use Topics  . Smoking status: Former Smoker    Types: Cigarettes  . Smokeless tobacco: Never Used     Comment: quit smoking 93yr ago  . Alcohol use No    Current Outpatient Prescriptions:  .  ACCU-CHEK FASTCLIX LANCETS MISC, , Disp: , Rfl:  .  albuterol (PROVENTIL HFA;VENTOLIN HFA) 108 (90 Base) MCG/ACT inhaler, Inhale 2 puffs into the lungs every 6 (six) hours as needed (for wheezing and shortness of  breath from seasonal allergies)., Disp: 1 Inhaler, Rfl: 1 .  albuterol (PROVENTIL) (2.5 MG/3ML) 0.083% nebulizer solution, , Disp: , Rfl:  .  atorvastatin (LIPITOR) 40 MG tablet, Take 1 tablet (40 mg total) by mouth at bedtime., Disp: 90 tablet, Rfl: 0 .  Blood Glucose Calibration (ACCU-CHEK SMARTVIEW CONTROL) LIQD, , Disp: , Rfl:  .  Blood Glucose Monitoring Suppl (ACCU-CHEK NANO SMARTVIEW) w/Device KIT, CHECK BLOOD SUGAR TWICE DAILY, Disp: 1 kit, Rfl: 0 .  flunisolide (NASALIDE) 25 MCG/ACT (0.025%) SOLN, Place 2 sprays into the nose 2 (two) times daily., Disp: 1 Bottle, Rfl: 0 .  glucose blood (ACCU-CHEK SMARTVIEW) test strip, TEST BLOOD SUGARS 4 TIMES DAILY, Disp: 150 each, Rfl: PRN .  ibuprofen (ADVIL,MOTRIN) 800 MG tablet, Take 1 tablet (800 mg total) by mouth every 8 (eight) hours as needed., Disp: 30 tablet, Rfl: 0 .  insulin detemir (LEVEMIR) 100 UNIT/ML injection, Inject 0.2 mLs (20 Units total) into the skin at bedtime., Disp: 10 mL, Rfl: 11 .  Insulin Pen Needle (BD ULTRA-FINE PEN NEEDLES) 29G X 12.7MM MISC, 1 pen by Does not apply route once., Disp: 100 each, Rfl: 3 .  ipratropium-albuterol (DUONEB) 0.5-2.5 (3) MG/3ML SOLN, TAKE 3 MLS BY NEBULIZATION EVERY 4 (FOUR) HOURS AS NEEDED., Disp: 360 mL, Rfl: 3 .  lisinopril-hydrochlorothiazide (PRINZIDE,ZESTORETIC) 20-25 MG tablet, Take 1 tablet by mouth daily., Disp: 90 tablet, Rfl: 1 .  metaxalone (SKELAXIN) 800 MG tablet, Take 1 tablet (800 mg total)  by mouth 3 (three) times daily., Disp: 30 tablet, Rfl: 0 .  montelukast (SINGULAIR) 10 MG tablet, TAKE 1 TABLET EVERY DAY, Disp: 90 tablet, Rfl: 1 .  omeprazole (PRILOSEC) 20 MG capsule, Take 1 capsule (20 mg total) by mouth 2 (two) times daily before a meal., Disp: 60 capsule, Rfl: 0 .  oxyCODONE-acetaminophen (PERCOCET) 10-325 MG tablet, Take 1 tablet by mouth every 4 (four) hours as needed for pain., Disp: , Rfl:  .  Semaglutide (OZEMPIC) 0.25 or 0.5 MG/DOSE SOPN, Inject 0.25 mg into the skin  once a week. (Start with 0.25 mg once a week x 4 weeks, then go to 0.5 mg once a week), Disp: 2 pen, Rfl: 0 .  traZODone (DESYREL) 50 MG tablet, Take 0.5-1 tablets (25-50 mg total) by mouth at bedtime as needed for sleep., Disp: 90 tablet, Rfl: 1  Allergies  Allergen Reactions  . Adhesive [Tape] Rash and Other (See Comments)    Regular tape is ok, allergy is to paper tape    ROS Ten systems reviewed and is negative except as mentioned in HPI  Objective  Vitals:   02/10/17 1137  BP: 132/78  Pulse: 87  Resp: 16  Temp: 97.4 F (36.3 C)  TempSrc: Oral  SpO2: 96%  Weight: 176 lb 9.6 oz (80.1 kg)  Height: '5\' 3"'$  (1.6 m)    Body mass index is 31.28 kg/m.  Nursing Note and Vital Signs reviewed.  Physical Exam  Constitutional: Patient appears well-developed and well-nourished. Obese No distress.  HEENT: head atraumatic, normocephalic Cardiovascular: Normal rate, regular rhythm, S1/S2 present.  No murmur or rub heard. No BLE edema. Pulmonary/Chest: Effort normal and breath sounds clear. No respiratory distress or retractions. Abdominal: Soft and moderately tender BLQ, bowel sounds hypoactive but present x4 quadrants. Psychiatric: Patient has a normal mood and affect. behavior is normal. Judgment and thought content normal. Rectal Exam: No impaction on   Recent Results (from the past 2160 hour(s))  HM DIABETES EYE EXAM     Status: None   Collection Time: 12/27/16 12:00 AM  Result Value Ref Range   HM Diabetic Eye Exam No Retinopathy No Retinopathy    Comment: [facility]: Dr Bridgett Larsson     Assessment & Plan  1. Constipation, unspecified constipation type - Ambulatory referral to Gastroenterology  2. Mucus in stool - Ambulatory referral to Gastroenterology  - Due to constipation, history of blockage, decreased bowel sounds, and no rectal impaction, patient is advised to go directly to [facility] ER for further work-up. She is agreeable but declines EMS transport. Patient is  hemodynamically stable upon leaving our office.  Report called to [facility].  -Red flags and when to present for emergency care or RTC including fever >101.1F, chest pain, shortness of breath, new/worsening/un-resolving symptoms, significant increase in abdominal pain, sudden bloating, reviewed with patient at time of visit. Follow up and care instructions discussed and provided in AVS.  I have reviewed this encounter including the documentation in this note and/or discussed this patient with the Johney Maine, [name], NP-C. I am certifying that I agree with the content of this note as supervising physician.  Steele Sizer, MD [facility] 02/10/2017, 4:55 PM

## 2017-02-10 NOTE — ED Provider Notes (Signed)
[facility] Emergency Department Provider Note  ____________________________________________  Time seen: Approximately 6:37 PM  I have reviewed the triage vital signs and the nursing notes.   HISTORY  Chief Complaint Constipation    HPI Lori Potts is a 71 y.o. female who complains of generalized abdominal pain, gradually worsening for the past 5 days. Moderate intensity. Waxing and waning. No alleviating factors. Worse if she tries to eat and associated with nausea but no vomiting. She reports a history of small bowel obstruction and this feels similar. She's been constant for the past 5 days has had a bowel movement despite trying enema MiraLAX and magnesium citrate. Pain is nonradiating.  Patient reports she first went to her doctor who did a rectal exam to attempt to disimpact was unable to feel or remove any stool.   Past Medical History:  Diagnosis Date  . Arthritis   . Cataract    right eye but immature  . Essential hypertension, benign    takes Lisinopril-HCTZ daily  . GERD (gastroesophageal reflux disease)   . History of colon polyps    benign  . History of migraine   . History of shingles   . Hyperlipidemia    takes Lipitor daily  . Low back pain   . Nocturia   . Seasonal allergies    takes Singulair daily as needed  . Type II or unspecified type diabetes mellitus without mention of complication, not stated as uncontrolled   . Vitamin D deficiency    takes Vit D weekly  . Weakness    numbness and tingling left arm     Patient Active Problem List   Diagnosis Date Noted  . Insomnia 11/29/2016  . 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 Vasomotor flushing 04/19/2016  . Abdominal wall pain in right flank 03/29/2016  . Diverticulosis 02/24/2016  . Hypercholesterolemia 06/16/2015  . FHx: migraine headaches 04/13/2015  . Chronic pain 04/13/2015  . Neck pain 02/26/2015  . Status post lumbar  surgery 02/26/2015  . Abnormal ECG 01/27/2015  . Angina pectoris ([place]) 01/27/2015  . Chest pain 01/27/2015  . Diabetes mellitus type 2, uncontrolled, without complications ([place]) 41/63/[id number]  . BP (high blood pressure) 01/27/2015     Past Surgical History:  Procedure Laterality Date  . ABDOMINAL HYSTERECTOMY    . ANTERIOR CERVICAL DECOMP/DISCECTOMY FUSION N/A 02/26/2015   Procedure: ANTERIOR CERVICAL DISCECTOMY FUSION C4-5 (1 LEVEL);  Surgeon: Melina Schools, MD;  Location: [place];  Service: Orthopedics;  Laterality: N/A;  . ANTERIOR LAT LUMBAR FUSION N/A 03/21/2013   Procedure: ANTERIOR LATERAL LUMBAR FUSION 1 LEVEL/ XLIF L3-L4 ;  Surgeon: Melina Schools, MD;  Location: [place];  Service: Orthopedics;  Laterality: N/A;  . APPENDECTOMY    . BACK SURGERY    . BACK SURGERY     Lumbar fusion x 2  . BREAST SURGERY  1990   Augementation  . CARDIAC CATHETERIZATION  5/12   ef 55%  . CARDIAC CATHETERIZATION  10/2010   [facility]; EF 55%  . CARDIAC CATHETERIZATION Left 02/16/2016   Procedure: Left Heart Cath and Coronary Angiography;  Surgeon: Yolonda Kida, MD;  Location: [place] [facility];  Service: Cardiovascular;  Laterality: Left;  . COLONOSCOPY    . ESOPHAGOGASTRODUODENOSCOPY    . [place]   growth removed from left kidney   . pain stimulator    . POSTERIOR CERVICAL FUSION/FORAMINOTOMY Right 03/21/2013   Procedure: POSTERIOR L2-3 RIGHT FORAMINOTOMY;  Surgeon: Melina Schools, MD;  Location: [place];  Service: Orthopedics;  Laterality: Right;  . REVISION OF SCAR TISSUE RECTUS MUSCLE    . SMALL BOWEL REPAIR    . SPINAL CORD STIMULATOR BATTERY EXCHANGE N/A 10/17/2012   Procedure: SPINAL CORD STIMULATOR BATTERY REMOVAL;  Surgeon: Melina Schools, MD;  Location: [place];  Service: Orthopedics;  Laterality: N/A;  . SPINAL CORD STIMULATOR BATTERY EXCHANGE N/A 07/22/2015   Procedure: REIMPLANTATION OF SPINAL CORD STIMULATOR BATTERY ;  Surgeon: Melina Schools, MD;  Location: [place];  Service:  Orthopedics;  Laterality: N/A;  . TOTAL KNEE ARTHROPLASTY Left      Prior to Admission medications   Medication Sig Start Date End Date Taking? Authorizing Provider  ACCU-CHEK FASTCLIX LANCETS [place]  08/04/15   [provider]  albuterol (PROVENTIL HFA;VENTOLIN HFA) 108 (90 Base) MCG/ACT inhaler Inhale 2 puffs into the lungs every 6 (six) hours as needed (for wheezing and shortness of breath from seasonal allergies). 09/30/15   Ashok Norris, MD  albuterol (PROVENTIL) (2.5 MG/3ML) 0.083% nebulizer solution  11/28/15   [provider]  atorvastatin (LIPITOR) 40 MG tablet Take 1 tablet (40 mg total) by mouth at bedtime. 12/20/16   Arnetha Courser, MD  Blood Glucose Calibration (ACCU-CHEK SMARTVIEW CONTROL) LIQD  08/04/15   [provider]  Blood Glucose Monitoring Suppl (ACCU-CHEK NANO SMARTVIEW) w/Device KIT CHECK BLOOD SUGAR TWICE DAILY 08/03/16   Roselee Nova, MD  flunisolide (NASALIDE) 25 MCG/ACT (0.025%) SOLN Place 2 sprays into the nose 2 (two) times daily. 11/30/16   Lada, Satira Anis, MD  glucose blood (ACCU-CHEK SMARTVIEW) test strip TEST BLOOD SUGARS 4 TIMES DAILY 08/07/15   Ashok Norris, MD  ibuprofen (ADVIL,MOTRIN) 800 MG tablet Take 1 tablet (800 mg total) by mouth every 8 (eight) hours as needed. 11/29/16   Lada, Satira Anis, MD  insulin detemir (LEVEMIR) 100 UNIT/ML injection Inject 0.2 mLs (20 Units total) into the skin at bedtime. 02/06/17   Lada, Satira Anis, MD  Insulin Pen Needle (BD ULTRA-FINE PEN NEEDLES) 29G X 12.7MM MISC 1 pen by Does not apply route once. 03/11/15   Ashok Norris, MD  ipratropium-albuterol (DUONEB) 0.5-2.5 (3) MG/3ML SOLN TAKE 3 MLS BY NEBULIZATION EVERY 4 (FOUR) HOURS AS NEEDED. 10/26/16   Roselee Nova, MD  levofloxacin (LEVAQUIN) 500 MG tablet Take 1 tablet (500 mg total) by mouth daily. 02/10/17   Carrie Mew, MD  lisinopril-hydrochlorothiazide (PRINZIDE,ZESTORETIC) 20-25 MG tablet Take 1 tablet by mouth daily. 07/04/16    Roselee Nova, MD  magnesium citrate SOLN Take 296 mLs (1 Bottle total) by mouth once. 02/10/17 02/10/17  Carrie Mew, MD  metaxalone (SKELAXIN) 800 MG tablet Take 1 tablet (800 mg total) by mouth 3 (three) times daily. 04/19/16   Roselee Nova, MD  metroNIDAZOLE (FLAGYL) 500 MG tablet Take 1 tablet (500 mg total) by mouth 3 (three) times daily. 02/10/17   Carrie Mew, MD  montelukast (SINGULAIR) 10 MG tablet TAKE 1 TABLET EVERY DAY 10/21/15   Ashok Norris, MD  omeprazole (PRILOSEC) 20 MG capsule Take 1 capsule (20 mg total) by mouth 2 (two) times daily before a meal. 02/03/17   Lada, Satira Anis, MD  oxyCODONE-acetaminophen (PERCOCET) 10-325 MG tablet Take 1 tablet by mouth every 4 (four) hours as needed for pain.    [provider]  polyethylene glycol powder (GLYCOLAX/MIRALAX) powder 2 cap fulls in a full glass of water, three times a day, for 5 days. 02/10/17  Carrie Mew, MD  Semaglutide [facility]) 0.25 or 0.5 MG/DOSE SOPN Inject 0.25 mg into the skin once a week. (Start with 0.25 mg once a week x 4 weeks, then go to 0.5 mg once a week) 02/06/17 02/10/17  Lada, Satira Anis, MD  senna-docusate (SENOKOT-S) 8.6-50 MG tablet Take 2 tablets by mouth 2 (two) times daily. 02/10/17   Carrie Mew, MD  traZODone (DESYREL) 50 MG tablet Take 0.5-1 tablets (25-50 mg total) by mouth at bedtime as needed for sleep. 12/29/16   Arnetha Courser, MD     Allergies Adhesive [tape]   Family History  Problem Relation Age of Onset  . Heart attack Mother   . Sarcoidosis Sister     Social History Social History  Substance Use Topics  . Smoking status: Former Smoker    Types: Cigarettes  . Smokeless tobacco: Never Used     Comment: quit smoking 5yr ago  . Alcohol use No    Review of Systems  Constitutional:   No fever or chills.  ENT:   No sore throat. No rhinorrhea. Cardiovascular:   No chest pain or syncope. Respiratory:   No dyspnea or cough. Gastrointestinal:   Positive as above abdominal pain without vomiting and diarrhea. Positive constipation Musculoskeletal:   Negative for focal pain or swelling All other systems reviewed and are negative except as documented above in ROS and HPI.  ____________________________________________   PHYSICAL EXAM:  VITAL SIGNS: ED Triage Vitals  Enc Vitals Group     BP 02/10/17 1707 (!) 160/79     Pulse Rate 02/10/17 1707 87     Resp 02/10/17 1707 16     Temp 02/10/17 1707 98.4 F (36.9 C)     Temp Source 02/10/17 1707 Oral     SpO2 02/10/17 1707 98 %     Weight 02/10/17 1708 176 lb (79.8 kg)     Height 02/10/17 1708 '5\' 3"'$  (1.6 m)     Head Circumference --      Peak Flow --      Pain Score 02/10/17 1709 8     Pain Loc --      Pain Edu? --      Excl. in [place] --     Vital signs reviewed, nursing assessments reviewed.   Constitutional:   Alert and oriented. Well appearing and in no distress. Eyes:   No scleral icterus.  EOMI. No nystagmus. No conjunctival pallor. PERRL. ENT   Head:   Normocephalic and atraumatic.   Nose:   No congestion/rhinnorhea.    Mouth/Throat:   MMM, no pharyngeal erythema. No peritonsillar mass.    Neck:   No meningismus. Full ROM Hematological/Lymphatic/Immunilogical:   No cervical lymphadenopathy. Cardiovascular:   RRR. Symmetric bilateral radial and DP pulses.  No murmurs.  Respiratory:   Normal respiratory effort without tachypnea/retractions. Breath sounds are clear and equal bilaterally. No wheezes/rales/rhonchi. Gastrointestinal:   Soft With generalized tenderness. Mildly distended. There is no CVA tenderness.  No rebound, rigidity, or guarding. Genitourinary:   deferred Musculoskeletal:   Normal range of motion in all extremities. No joint effusions.  No lower extremity tenderness.  No edema. Neurologic:   Normal speech and language.  Motor grossly intact. No gross focal neurologic deficits are appreciated.  Skin:    Skin is warm, dry and intact. No rash  noted.  No petechiae, purpura, or bullae.  ____________________________________________    LABS (pertinent positives/negatives) (all labs ordered are listed, but only abnormal results are displayed) Labs Reviewed  COMPREHENSIVE METABOLIC PANEL - Abnormal; Notable for the following:       Result Value   Potassium 3.3 (*)    All other components within normal limits  URINALYSIS, COMPLETE (UACMP) WITH MICROSCOPIC - Abnormal; Notable for the following:    Color, Urine YELLOW (*)    APPearance CLEAR (*)    Squamous Epithelial / LPF 0-5 (*)    All other components within normal limits  CBC - Abnormal; Notable for the following:    MCV 77.2 (*)    RDW 14.7 (*)    All other components within normal limits  URINE CULTURE  LIPASE, BLOOD  DIFFERENTIAL  CBC WITH DIFFERENTIAL/PLATELET   ____________________________________________   EKG    ____________________________________________    RADIOLOGY  Ct Abdomen Pelvis W Contrast  Result Date: 02/10/2017 CLINICAL DATA:  Acute onset of constipation and lower mid abdominal pressure. Mucus in stools. Initial encounter. EXAM: CT ABDOMEN AND PELVIS WITH CONTRAST TECHNIQUE: Multidetector CT imaging of the abdomen and pelvis was performed using the standard protocol following bolus administration of intravenous contrast. CONTRAST:  167m ISOVUE-300 IOPAMIDOL (ISOVUE-300) INJECTION 61% COMPARISON:  CT of the abdomen and pelvis performed 07/05/2011 FINDINGS: Lower chest: A left-sided breast implant is partially imaged. The visualized lung bases are grossly clear. The visualized portions of the mediastinum are unremarkable. Hepatobiliary: The liver is unremarkable in appearance. The gallbladder is unremarkable in appearance. The common bile duct remains normal in caliber. Pancreas: The pancreas is within normal limits. Spleen: The spleen is unremarkable in appearance. Adrenals/Urinary Tract: The adrenal glands are grossly unremarkable. Scattered  bilateral renal cysts are seen. There is no evidence of hydronephrosis. No renal or ureteral stones are identified. Stomach/Bowel: The stomach is unremarkable in appearance. The small bowel is within normal limits. The patient is status post appendectomy. Focal soft tissue inflammation is noted at the distal sigmoid colon, with mild wall thickening and likely inflamed diverticulum, concerning for mild acute diverticulitis. There is no evidence of perforation or abscess formation. Scattered diverticulosis is noted along the descending and sigmoid colon. Vascular/Lymphatic: Scattered calcification is seen along the abdominal aorta and its branches. The abdominal aorta is otherwise grossly unremarkable. The inferior vena cava is grossly unremarkable. No retroperitoneal lymphadenopathy is seen. No pelvic sidewall lymphadenopathy is identified. Reproductive: The bladder is mildly distended and grossly unremarkable. The patient is status post hysterectomy. The ovaries are relatively symmetric. No suspicious adnexal masses are seen. Other: A metallic device is noted at the right flank, with associated thoracic spinal stimulation leads. Musculoskeletal: No acute osseous abnormalities are identified. The patient is status post lumbar spinal fusion at L3-S1, with underlying decompression. The visualized musculature is unremarkable in appearance. IMPRESSION: 1. Mild acute diverticulitis at the distal sigmoid colon, with focal soft tissue inflammation, mild wall thickening and likely an inflamed diverticulum. No evidence of perforation or abscess formation at this time. After completion of treatment, would consider sigmoidoscopy to exclude underlying mass. 2. Scattered diverticulosis along the descending and sigmoid colon. 3. Scattered aortic atherosclerosis. 4. Bilateral renal cysts noted. Electronically Signed   By: JGarald BaldingM.D.   On: 02/10/2017 21:27     ____________________________________________   PROCEDURES Procedures  ____________________________________________   INITIAL IMPRESSION / ASSESSMENT AND PLAN / ED COURSE  Pertinent labs & imaging results that were available during my care of the patient were reviewed by me and considered in my medical decision making (see chart for details).  Patient presents with abdominal pain and distention and constipation. no vomiting, but  concerning for SBO versus constipation. I doubt mesenteric ischemia volvulus or intussusception. Highly doubt AAA or perforation. We'll get a CT scan and check labs. IV fluids for hydration. Morphine and Zofran for symptom control.   ----------------------------------------- 10:54 PM on 02/10/2017 -----------------------------------------  Workup negative. Patient calm comfortable. Vital signs unremarkable. We'll discharge home, counseled on cutting back on her oxycodone as tolerated. Magnesium citrate and MiraLAX Senokot. We'll treat her diverticulitis given age and comorbidities. Her insurance plan prefers Levaquin over Cipro so prescribed Levaquin as well as Flagyl.     ____________________________________________   FINAL CLINICAL IMPRESSION(S) / ED DIAGNOSES  Final diagnoses:  Constipation due to opioid therapy  Diverticulitis      New Prescriptions   LEVOFLOXACIN (LEVAQUIN) 500 MG TABLET    Take 1 tablet (500 mg total) by mouth daily.   MAGNESIUM CITRATE SOLN    Take 296 mLs (1 Bottle total) by mouth once.   METRONIDAZOLE (FLAGYL) 500 MG TABLET    Take 1 tablet (500 mg total) by mouth 3 (three) times daily.   POLYETHYLENE GLYCOL POWDER (GLYCOLAX/MIRALAX) POWDER    2 cap fulls in a full glass of water, three times a day, for 5 days.   SENNA-DOCUSATE (SENOKOT-S) 8.6-50 MG TABLET    Take 2 tablets by mouth 2 (two) times daily.     Portions of this note were generated with dragon dictation software. Dictation errors may occur despite best  attempts at proofreading.    Carrie Mew, MD 02/10/17 [id number]

## 2017-02-10 NOTE — Patient Instructions (Signed)
Please go directly to [facility] ER for further care - I am concerned for a Bowel Blockage.

## 2017-02-10 NOTE — ED Notes (Signed)
Patient transported to CT 

## 2017-02-10 NOTE — ED Notes (Signed)
Delay meds for IV access

## 2017-02-10 NOTE — Discharge Instructions (Signed)
Results for orders placed or performed during the hospital encounter of 02/10/17  Comprehensive metabolic panel  Result Value Ref Range   Sodium 138 135 - 145 mmol/L   Potassium 3.3 (L) 3.5 - 5.1 mmol/L   Chloride 105 101 - 111 mmol/L   CO2 25 22 - 32 mmol/L   Glucose, Bld 89 65 - 99 mg/dL   BUN 17 6 - 20 mg/dL   Creatinine, Ser 8.110.64 0.44 - 1.00 mg/dL   Calcium 9.7 8.9 - 91.410.3 mg/dL   Total Protein 7.6 6.5 - 8.1 g/dL   Albumin 4.0 3.5 - 5.0 g/dL   AST 20 15 - 41 U/L   ALT 15 14 - 54 U/L   Alkaline Phosphatase 104 38 - 126 U/L   Total Bilirubin 0.6 0.3 - 1.2 mg/dL   GFR calc non Af Amer >60 >60 mL/min   GFR calc Af Amer >60 >60 mL/min   Anion gap 8 5 - 15  Urinalysis, Complete w Microscopic  Result Value Ref Range   Color, Urine YELLOW (A) YELLOW   APPearance CLEAR (A) CLEAR   Specific Gravity, Urine 1.012 1.005 - 1.030   pH 6.0 5.0 - 8.0   Glucose, UA NEGATIVE NEGATIVE mg/dL   Hgb urine dipstick NEGATIVE NEGATIVE   Bilirubin Urine NEGATIVE NEGATIVE   Ketones, ur NEGATIVE NEGATIVE mg/dL   Protein, ur NEGATIVE NEGATIVE mg/dL   Nitrite NEGATIVE NEGATIVE   Leukocytes, UA NEGATIVE NEGATIVE   RBC / HPF NONE SEEN 0 - 5 RBC/hpf   WBC, UA 0-5 0 - 5 WBC/hpf   Bacteria, UA NONE SEEN NONE SEEN   Squamous Epithelial / LPF 0-5 (A) NONE SEEN   Mucous PRESENT   Lipase, blood  Result Value Ref Range   Lipase 30 11 - 51 U/L  CBC  Result Value Ref Range   WBC 5.3 3.6 - 11.0 K/uL   RBC 4.55 3.80 - 5.20 MIL/uL   Hemoglobin 12.0 12.0 - 16.0 g/dL   HCT 78.235.2 95.635.0 - 21.347.0 %   MCV 77.2 (L) 80.0 - 100.0 fL   MCH 26.4 26.0 - 34.0 pg   MCHC 34.2 32.0 - 36.0 g/dL   RDW 08.614.7 (H) 57.811.5 - 46.914.5 %   Platelets 310 150 - 440 K/uL  Differential  Result Value Ref Range   Neutrophils Relative % 67 %   Neutro Abs 3.5 1.4 - 6.5 K/uL   Lymphocytes Relative 25 %   Lymphs Abs 1.3 1.0 - 3.6 K/uL   Monocytes Relative 6 %   Monocytes Absolute 0.3 0.2 - 0.9 K/uL   Eosinophils Relative 1 %   Eosinophils  Absolute 0.1 0 - 0.7 K/uL   Basophils Relative 1 %   Basophils Absolute 0.1 0 - 0.1 K/uL   Ct Abdomen Pelvis W Contrast  Result Date: 02/10/2017 CLINICAL DATA:  Acute onset of constipation and lower mid abdominal pressure. Mucus in stools. Initial encounter. EXAM: CT ABDOMEN AND PELVIS WITH CONTRAST TECHNIQUE: Multidetector CT imaging of the abdomen and pelvis was performed using the standard protocol following bolus administration of intravenous contrast. CONTRAST:  100mL ISOVUE-300 IOPAMIDOL (ISOVUE-300) INJECTION 61% COMPARISON:  CT of the abdomen and pelvis performed 07/05/2011 FINDINGS: Lower chest: A left-sided breast implant is partially imaged. The visualized lung bases are grossly clear. The visualized portions of the mediastinum are unremarkable. Hepatobiliary: The liver is unremarkable in appearance. The gallbladder is unremarkable in appearance. The common bile duct remains normal in caliber. Pancreas: The pancreas is within normal  limits. Spleen: The spleen is unremarkable in appearance. Adrenals/Urinary Tract: The adrenal glands are grossly unremarkable. Scattered bilateral renal cysts are seen. There is no evidence of hydronephrosis. No renal or ureteral stones are identified. Stomach/Bowel: The stomach is unremarkable in appearance. The small bowel is within normal limits. The patient is status post appendectomy. Focal soft tissue inflammation is noted at the distal sigmoid colon, with mild wall thickening and likely inflamed diverticulum, concerning for mild acute diverticulitis. There is no evidence of perforation or abscess formation. Scattered diverticulosis is noted along the descending and sigmoid colon. Vascular/Lymphatic: Scattered calcification is seen along the abdominal aorta and its branches. The abdominal aorta is otherwise grossly unremarkable. The inferior vena cava is grossly unremarkable. No retroperitoneal lymphadenopathy is seen. No pelvic sidewall lymphadenopathy is  identified. Reproductive: The bladder is mildly distended and grossly unremarkable. The patient is status post hysterectomy. The ovaries are relatively symmetric. No suspicious adnexal masses are seen. Other: A metallic device is noted at the right flank, with associated thoracic spinal stimulation leads. Musculoskeletal: No acute osseous abnormalities are identified. The patient is status post lumbar spinal fusion at L3-S1, with underlying decompression. The visualized musculature is unremarkable in appearance. IMPRESSION: 1. Mild acute diverticulitis at the distal sigmoid colon, with focal soft tissue inflammation, mild wall thickening and likely an inflamed diverticulum. No evidence of perforation or abscess formation at this time. After completion of treatment, would consider sigmoidoscopy to exclude underlying mass. 2. Scattered diverticulosis along the descending and sigmoid colon. 3. Scattered aortic atherosclerosis. 4. Bilateral renal cysts noted. Electronically Signed   By: Roanna Raider M.D.   On: 02/10/2017 21:27

## 2017-02-12 LAB — URINE CULTURE

## 2017-02-27 ENCOUNTER — Ambulatory Visit: Payer: Medicare HMO | Admitting: Family Medicine

## 2017-02-27 ENCOUNTER — Other Ambulatory Visit: Payer: Self-pay | Admitting: *Deleted

## 2017-02-27 NOTE — Patient Outreach (Signed)
[facility] Care Management  02/27/2017  Lori Potts [birth year] [id number]   RN Health Coach  Attempted #1  outreach call to patient.  Patient was unavailable. No voice mail pickup. Plan: RN will call patient again within 14 days.  [name] Niveah Boerner BSN RN [facility] Care Management [phone]

## 2017-03-01 ENCOUNTER — Other Ambulatory Visit: Payer: Self-pay | Admitting: Family Medicine

## 2017-03-01 NOTE — Telephone Encounter (Signed)
Pt has appt tomorrow

## 2017-03-02 ENCOUNTER — Encounter: Payer: Self-pay | Admitting: Family Medicine

## 2017-03-02 ENCOUNTER — Ambulatory Visit (INDEPENDENT_AMBULATORY_CARE_PROVIDER_SITE_OTHER): Payer: Medicare HMO | Admitting: Family Medicine

## 2017-03-02 DIAGNOSIS — E1165 Type 2 diabetes mellitus with hyperglycemia: Secondary | ICD-10-CM

## 2017-03-02 DIAGNOSIS — IMO0001 Reserved for inherently not codable concepts without codable children: Secondary | ICD-10-CM

## 2017-03-02 DIAGNOSIS — I700 Atherosclerosis of aorta: Secondary | ICD-10-CM

## 2017-03-02 DIAGNOSIS — Z794 Long term (current) use of insulin: Secondary | ICD-10-CM

## 2017-03-02 HISTORY — DX: Atherosclerosis of aorta: I70.0

## 2017-03-02 LAB — POCT GLYCOSYLATED HEMOGLOBIN (HGB A1C): Hemoglobin A1C: 9.4

## 2017-03-02 NOTE — Progress Notes (Signed)
BP 130/76 (BP Location: Left Arm, Patient Position: Sitting, Cuff Size: Large)   Pulse 78   Temp 98.1 F (36.7 C) (Oral)   Resp 16   Ht 5\' 3"  (1.6 m)   Wt 173 lb 11.2 oz (78.8 kg)   SpO2 98%   BMI 30.77 kg/m    Subjective:    Patient ID: Lori Potts, female    DOB: 06-20-1946, 71 y.o.   MRN: [id number]  HPI: Lori Potts is a 71 y.o. female  Chief Complaint  Patient presents with  . Follow-up    3 week F/U    HPI Sinus problems for years Going to see ENT next week; going on for years No fevers Drainage, down the back of the throat, on the nasal spray already Headaches; left ear bothering her; right ear like a butterfly No sore throat No cave exploring  On xultophy now, working so well; sugars are much better controlled; last A1c over 8 Lost 3 pounds; sugars range 84-125 Used to smoke and quit about 10 years; never smoked one pack a day, one pack lasted her a week; just grabbed them  Last cigarette was the day her mother died, 01-10-2008 Breathing is okay; not coughing up blood  Last labs reviewed; K+ was just a little low  Mild aortic arch calcification LDL was 130 Lab Results  Component Value Date   CHOL 213 (H) 07/04/2016   HDL 65 07/04/2016   LDLCALC 130 (H) 07/04/2016   TRIG 92 07/04/2016   CHOLHDL 3.3 07/04/2016  on statin  Depression screen [facility] 2/9 02/10/2017 02/03/2017 01/26/2017 11/29/2016 11/29/2016  Decreased Interest 0 0 0 0 0  Down, Depressed, Hopeless 0 0 0 0 0  PHQ - 2 Score 0 0 0 0 0    Relevant past medical, surgical, family and social history reviewed Past Medical History:  Diagnosis Date  . Arthritis   . 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 Cataract    right eye but immature  . Essential hypertension, benign    takes Lisinopril-HCTZ daily  . GERD (gastroesophageal reflux disease)   . History of colon polyps    benign  . History of migraine   . History of shingles   . Hyperlipidemia    takes Lipitor daily  . Low back pain    . Nocturia   . Seasonal allergies    takes Singulair daily as needed  . Type II or unspecified type diabetes mellitus without mention of complication, not stated as uncontrolled   . Vitamin D deficiency    takes Vit D weekly  . Weakness    numbness and tingling left arm   Past Surgical History:  Procedure Laterality Date  . ABDOMINAL HYSTERECTOMY    . ANTERIOR CERVICAL DECOMP/DISCECTOMY FUSION N/A 02/26/2015   Procedure: ANTERIOR CERVICAL DISCECTOMY FUSION C4-5 (1 LEVEL);  Surgeon: Venita Lick, MD;  Location: [facility] OR;  Service: Orthopedics;  Laterality: N/A;  . ANTERIOR LAT LUMBAR FUSION N/A 03/21/2013   Procedure: ANTERIOR LATERAL LUMBAR FUSION 1 LEVEL/ XLIF L3-L4 ;  Surgeon: Venita Lick, MD;  Location: [facility] OR;  Service: Orthopedics;  Laterality: N/A;  . APPENDECTOMY    . BACK SURGERY    . BACK SURGERY     Lumbar fusion x 2  . BREAST SURGERY  1990   Augementation  . CARDIAC CATHETERIZATION  5/12   ef 55%  . CARDIAC CATHETERIZATION  10/2010   [facility]; EF 55%  . CARDIAC CATHETERIZATION Left 02/16/2016  Procedure: Left Heart Cath and Coronary Angiography;  Surgeon: Alwyn Pea, MD;  Location: [facility] INVASIVE CV LAB;  Service: Cardiovascular;  Laterality: Left;  . COLONOSCOPY    . ESOPHAGOGASTRODUODENOSCOPY    . KIDNEY SURGERY  1998   growth removed from left kidney   . pain stimulator    . POSTERIOR CERVICAL FUSION/FORAMINOTOMY Right 03/21/2013   Procedure: POSTERIOR L2-3 RIGHT FORAMINOTOMY;  Surgeon: Venita Lick, MD;  Location: [facility] OR;  Service: Orthopedics;  Laterality: Right;  . REVISION OF SCAR TISSUE RECTUS MUSCLE    . SMALL BOWEL REPAIR    . SPINAL CORD STIMULATOR BATTERY EXCHANGE N/A 10/17/2012   Procedure: SPINAL CORD STIMULATOR BATTERY REMOVAL;  Surgeon: Venita Lick, MD;  Location: [facility] OR;  Service: Orthopedics;  Laterality: N/A;  . SPINAL CORD STIMULATOR BATTERY EXCHANGE N/A 07/22/2015   Procedure: REIMPLANTATION OF SPINAL CORD STIMULATOR BATTERY ;  Surgeon: Venita Lick, MD;  Location: [facility] OR;  Service: Orthopedics;  Laterality: N/A;  . TOTAL KNEE ARTHROPLASTY Left    Family History  Problem Relation Age of Onset  . Heart attack Mother   . Sarcoidosis Sister    Social History   Social History  . Marital status: Married    Spouse name: N/A  . Number of children: N/A  . Years of education: N/A   Occupational History  . Not on file.   Social History Main Topics  . Smoking status: Former Smoker    Types: Cigarettes  . Smokeless tobacco: Never Used     Comment: quit smoking 55yrs ago  . Alcohol use No  . Drug use: No  . Sexual activity: Yes    Birth control/ protection: Surgical   Other Topics Concern  . Not on file   Social History Narrative   ** Merged History Encounter **        Interim medical history since last visit reviewed. Allergies and medications reviewed  Review of Systems Per HPI unless specifically indicated above No urine symptoms     Objective:    BP 130/76 (BP Location: Left Arm, Patient Position: Sitting, Cuff Size: Large)   Pulse 78   Temp 98.1 F (36.7 C) (Oral)   Resp 16   Ht 5\' 3"  (1.6 m)   Wt 173 lb 11.2 oz (78.8 kg)   SpO2 98%   BMI 30.77 kg/m   Wt Readings from Last 3 Encounters:  03/02/17 173 lb 11.2 oz (78.8 kg)  02/10/17 176 lb (79.8 kg)  02/10/17 176 lb 9.6 oz (80.1 kg)    Physical Exam  Constitutional: She appears well-developed and well-nourished. No distress.  HENT:  Head: Normocephalic and atraumatic.  Eyes: EOM are normal. No scleral icterus.  Neck: No thyromegaly present.  Cardiovascular: Normal rate, regular rhythm and normal heart sounds.   No murmur heard. Pulmonary/Chest: Effort normal and breath sounds normal. No respiratory distress. She has no wheezes.  Abdominal: Soft. Bowel sounds are normal. She exhibits no distension.  Musculoskeletal: Normal range of motion. She exhibits no edema.  Neurological: She is alert. She exhibits normal muscle tone.  Skin: Skin is warm  and dry. She is not diaphoretic. No pallor.  Psychiatric: She has a normal mood and affect. Her behavior is normal. Judgment and thought content normal.   Diabetic Foot Form - Detailed   Diabetic Foot Exam - detailed Diabetic Foot exam was performed with the following findings:  Yes 03/02/2017  8:06 AM  Visual Foot Exam completed.:  Yes  Pulse Foot Exam  completed.:  Yes  Right Dorsalis Pedis:  Present Left Dorsalis Pedis:  Present  Sensory Foot Exam Completed.:  Yes Semmes-Weinstein Monofilament Test R Site 1-Great Toe:  Pos L Site 1-Great Toe:  Pos         Results for orders placed or performed in visit on 03/02/17  POCT HgB A1C  Result Value Ref Range   Hemoglobin A1C 9.4       Assessment & Plan:   Problem List Items Addressed This Visit      Cardiovascular and Mediastinum   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HCC)    Check lipids (fasting); continue statin      Relevant Orders   Lipid panel     Endocrine   Diabetes mellitus type 2, uncontrolled, without complications (HCC)    Check POCT A1c today      Relevant Medications   Insulin Degludec-Liraglutide (XULTOPHY) 100-3.6 UNIT-MG/ML SOPN   Other Relevant Orders   POCT HgB A1C (Completed)       Follow up plan: Return in about 3 months (around 06/02/2017) for twenty minute follow-up with fasting labs (or just after).  An after-visit summary was printed and given to the patient at check-out.  Please see the patient instructions which may contain other information and recommendations beyond what is mentioned above in the assessment and plan.  Meds ordered this encounter  Medications  . Insulin Degludec-Liraglutide (XULTOPHY) 100-3.6 UNIT-MG/ML SOPN    Sig: Inject 12 Units into the skin at bedtime.    Orders Placed This Encounter  Procedures  . Lipid panel  . POCT HgB A1C

## 2017-03-02 NOTE — Assessment & Plan Note (Signed)
Check lipids (fasting); continue statin

## 2017-03-02 NOTE — Patient Instructions (Addendum)
Return for fasting cholesterol panel at your convenience soon Goal LDL is less than 70 Keep up the great work with your sugars and weight loss

## 2017-03-02 NOTE — Assessment & Plan Note (Signed)
Check POCT A1c today

## 2017-03-09 ENCOUNTER — Ambulatory Visit: Payer: Self-pay | Admitting: *Deleted

## 2017-03-13 DIAGNOSIS — J301 Allergic rhinitis due to pollen: Secondary | ICD-10-CM | POA: Diagnosis not present

## 2017-03-13 DIAGNOSIS — R51 Headache: Secondary | ICD-10-CM | POA: Diagnosis not present

## 2017-03-24 ENCOUNTER — Other Ambulatory Visit: Payer: Self-pay | Admitting: Family Medicine

## 2017-03-24 DIAGNOSIS — E7800 Pure hypercholesterolemia, unspecified: Secondary | ICD-10-CM

## 2017-03-24 DIAGNOSIS — M47816 Spondylosis without myelopathy or radiculopathy, lumbar region: Secondary | ICD-10-CM | POA: Diagnosis not present

## 2017-03-24 NOTE — Telephone Encounter (Signed)
Please remind pt to have lipids done soon I'll send limited refill

## 2017-03-24 NOTE — Telephone Encounter (Signed)
Left detail messaged 

## 2017-03-28 ENCOUNTER — Ambulatory Visit: Payer: Self-pay | Admitting: *Deleted

## 2017-04-03 ENCOUNTER — Ambulatory Visit: Payer: Self-pay | Admitting: *Deleted

## 2017-04-03 ENCOUNTER — Encounter: Payer: Self-pay | Admitting: Gastroenterology

## 2017-04-03 ENCOUNTER — Encounter (INDEPENDENT_AMBULATORY_CARE_PROVIDER_SITE_OTHER): Payer: Self-pay

## 2017-04-03 ENCOUNTER — Ambulatory Visit (INDEPENDENT_AMBULATORY_CARE_PROVIDER_SITE_OTHER): Payer: Medicare HMO | Admitting: Gastroenterology

## 2017-04-03 ENCOUNTER — Encounter: Payer: Self-pay | Admitting: *Deleted

## 2017-04-03 ENCOUNTER — Other Ambulatory Visit: Payer: Self-pay

## 2017-04-03 ENCOUNTER — Other Ambulatory Visit: Payer: Self-pay | Admitting: *Deleted

## 2017-04-03 VITALS — BP 141/73 | HR 83 | Temp 97.6°F | Ht 63.0 in | Wt 171.0 lb

## 2017-04-03 DIAGNOSIS — K5900 Constipation, unspecified: Secondary | ICD-10-CM

## 2017-04-03 DIAGNOSIS — K219 Gastro-esophageal reflux disease without esophagitis: Secondary | ICD-10-CM

## 2017-04-03 NOTE — Progress Notes (Signed)
Gastroenterology Consultation  Referring Provider:     Hubbard Hartshorn, [name] Primary Care Physician:  Arnetha Courser, MD Primary Gastroenterologist:  Dr. Allen Norris     Reason for Consultation:     Constipation and GERD        HPI:   Lori Potts is a 71 y.o. y/o female referred for consultation & management of Constipation and GERD by Dr. Sanda Klein, Satira Anis, MD.  Patient comes in today after reporting that she had seen me many years ago. The patient now reports that she's had constipation since starting her pain medication. The patient also has heartburn despite being on Prilosec twice a day. The patient states that she has acid breakthrough approximately twice a week. There is no report of any dysphagia. She does report some weight loss but she attributes this to her recent change in her diabetic medication. There is no report of any black stools or bloody stools. She does have some abdominal discomfort when she gets constipated. There is no family history of any colon cancer colon polyps. The patient was on Nexium in the past but states that it was not covered by her insurance so therefore he was put on omeprazole.  Past Medical History:  Diagnosis Date  . Arthritis   . 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 Cataract    right eye but immature  . Essential hypertension, benign    takes Lisinopril-HCTZ daily  . GERD (gastroesophageal reflux disease)   . History of colon polyps    benign  . History of migraine   . History of shingles   . Hyperlipidemia    takes Lipitor daily  . Low back pain   . Nocturia   . Seasonal allergies    takes Singulair daily as needed  . Type II or unspecified type diabetes mellitus without mention of complication, not stated as uncontrolled   . Vitamin D deficiency    takes Vit D weekly  . Weakness    numbness and tingling left arm    Past Surgical History:  Procedure Laterality Date  . ABDOMINAL HYSTERECTOMY    . ANTERIOR CERVICAL  DECOMP/DISCECTOMY FUSION N/A 02/26/2015   Procedure: ANTERIOR CERVICAL DISCECTOMY FUSION C4-5 (1 LEVEL);  Surgeon: Melina Schools, MD;  Location: [place];  Service: Orthopedics;  Laterality: N/A;  . ANTERIOR LAT LUMBAR FUSION N/A 03/21/2013   Procedure: ANTERIOR LATERAL LUMBAR FUSION 1 LEVEL/ XLIF L3-L4 ;  Surgeon: Melina Schools, MD;  Location: [place];  Service: Orthopedics;  Laterality: N/A;  . APPENDECTOMY    . BACK SURGERY    . BACK SURGERY     Lumbar fusion x 2  . BREAST SURGERY  1990   Augementation  . CARDIAC CATHETERIZATION  5/12   ef 55%  . CARDIAC CATHETERIZATION  10/2010   [facility]; EF 55%  . CARDIAC CATHETERIZATION Left 02/16/2016   Procedure: Left Heart Cath and Coronary Angiography;  Surgeon: Yolonda Kida, MD;  Location: [place] CV LAB;  Service: Cardiovascular;  Laterality: Left;  . COLONOSCOPY    . ESOPHAGOGASTRODUODENOSCOPY    . [place]   growth removed from left kidney   . pain stimulator    . POSTERIOR CERVICAL FUSION/FORAMINOTOMY Right 03/21/2013   Procedure: POSTERIOR L2-3 RIGHT FORAMINOTOMY;  Surgeon: Melina Schools, MD;  Location: [place];  Service: Orthopedics;  Laterality: Right;  . REVISION OF SCAR TISSUE RECTUS MUSCLE    . SMALL BOWEL REPAIR    . SPINAL  CORD STIMULATOR BATTERY EXCHANGE N/A 10/17/2012   Procedure: SPINAL CORD STIMULATOR BATTERY REMOVAL;  Surgeon: Melina Schools, MD;  Location: [place];  Service: Orthopedics;  Laterality: N/A;  . SPINAL CORD STIMULATOR BATTERY EXCHANGE N/A 07/22/2015   Procedure: REIMPLANTATION OF SPINAL CORD STIMULATOR BATTERY ;  Surgeon: Melina Schools, MD;  Location: [place];  Service: Orthopedics;  Laterality: N/A;  . TOTAL KNEE ARTHROPLASTY Left     Prior to Admission medications   Medication Sig Start Date End Date Taking? Authorizing Provider  ACCU-CHEK FASTCLIX LANCETS [place]  08/04/15  Yes [provider]  albuterol (PROVENTIL HFA;VENTOLIN HFA) 108 (90 Base) MCG/ACT inhaler Inhale 2 puffs into the lungs every  6 (six) hours as needed (for wheezing and shortness of breath from seasonal allergies). 09/30/15  Yes Ashok Norris, MD  albuterol (PROVENTIL) (2.5 MG/3ML) 0.083% nebulizer solution  11/28/15  Yes [provider]  atorvastatin (LIPITOR) 40 MG tablet TAKE 1 TABLET (40 MG TOTAL) BY MOUTH AT BEDTIME. 03/24/17  Yes Lada, Satira Anis, MD  Blood Glucose Calibration (ACCU-CHEK SMARTVIEW CONTROL) LIQD  08/04/15  Yes [provider]  Blood Glucose Monitoring Suppl (ACCU-CHEK NANO SMARTVIEW) w/Device KIT CHECK BLOOD SUGAR TWICE DAILY 08/03/16  Yes Keith Rake Asad A, MD  flunisolide (NASALIDE) 25 MCG/ACT (0.025%) SOLN Place 2 sprays into the nose 2 (two) times daily. 11/30/16  Yes Lada, Satira Anis, MD  glucose blood (ACCU-CHEK SMARTVIEW) test strip TEST BLOOD SUGARS 4 TIMES DAILY 08/07/15  Yes Ashok Norris, MD  ibuprofen (ADVIL,MOTRIN) 800 MG tablet Take 1 tablet (800 mg total) by mouth every 8 (eight) hours as needed. 11/29/16  Yes Lada, Satira Anis, MD  Insulin Degludec-Liraglutide (XULTOPHY) 100-3.6 UNIT-MG/ML SOPN Inject 12 Units into the skin at bedtime.   Yes [provider]  Insulin Pen Needle (BD ULTRA-FINE PEN NEEDLES) 29G X 12.7MM MISC 1 pen by Does not apply route once. 03/11/15  Yes Morrisey, Serita Grit, MD  ipratropium-albuterol (DUONEB) 0.5-2.5 (3) MG/3ML SOLN TAKE 3 MLS BY NEBULIZATION EVERY 4 (FOUR) HOURS AS NEEDED. 10/26/16  Yes Roselee Nova, MD  lisinopril-hydrochlorothiazide (PRINZIDE,ZESTORETIC) 20-25 MG tablet Take 1 tablet by mouth daily. 07/04/16  Yes Roselee Nova, MD  metaxalone (SKELAXIN) 800 MG tablet Take 1 tablet (800 mg total) by mouth 3 (three) times daily. 04/19/16  Yes Keith Rake Asad A, MD  montelukast (SINGULAIR) 10 MG tablet TAKE 1 TABLET EVERY DAY 10/21/15  Yes Ashok Norris, MD  omeprazole (PRILOSEC) 20 MG capsule TAKE 1 CAPSULE (20 MG TOTAL) BY MOUTH 2 (TWO) TIMES DAILY BEFORE A MEAL. 03/01/17  Yes Lada, Satira Anis, MD  oxyCODONE-acetaminophen  (PERCOCET) 10-325 MG tablet Take 1 tablet by mouth every 4 (four) hours as needed for pain.   Yes [provider]  senna-docusate (SENOKOT-S) 8.6-50 MG tablet Take 2 tablets by mouth 2 (two) times daily. 02/10/17  Yes Carrie Mew, MD  tiZANidine (ZANAFLEX) 4 MG tablet  03/24/17  Yes [provider]  traZODone (DESYREL) 50 MG tablet Take 0.5-1 tablets (25-50 mg total) by mouth at bedtime as needed for sleep. 12/29/16  Yes Lada, Satira Anis, MD  polyethylene glycol powder (GLYCOLAX/MIRALAX) powder 2 cap fulls in a full glass of water, three times a day, for 5 days. Patient not taking: Reported on 04/03/2017 02/10/17   Carrie Mew, MD    Family History  Problem Relation Age of Onset  . Heart attack Mother   . Sarcoidosis Sister      Social History  Substance Use  Topics  . Smoking status: Former Smoker    Types: Cigarettes  . Smokeless tobacco: Never Used     Comment: quit smoking 28yr ago  . Alcohol use No    Allergies as of 04/03/2017 - Review Complete 04/03/2017  Allergen Reaction Noted  . Adhesive [tape] Rash and Other (See Comments) 03/20/2013    Review of Systems:    All systems reviewed and negative except where noted in HPI.   Physical Exam:  BP (!) 141/73   Pulse 83   Temp 97.6 F (36.4 C) (Oral)   Ht _0  (1.6 m)   Wt 171 lb (77.6 kg)   BMI 30.29 kg/m  No LMP recorded. Patient is postmenopausal. Psych:  Alert and cooperative. Normal mood and affect. General:   Alert,  Well-developed, well-nourished, pleasant and cooperative in NAD Head:  Normocephalic and atraumatic. Eyes:  Sclera clear, no icterus.   Conjunctiva pink. Ears:  Normal auditory acuity. Nose:  No deformity, discharge, or lesions. Mouth:  No deformity or lesions,oropharynx pink & moist. Neck:  Supple; no masses or thyromegaly. Lungs:  Respirations even and unlabored.  Clear throughout to auscultation.   No wheezes, crackles, or rhonchi. No acute distress. Heart:  Regular rate  and rhythm; no murmurs, clicks, rubs, or gallops. Abdomen:  Normal bowel sounds.  No bruits.  Soft, non-tender and non-distended without masses, hepatosplenomegaly or hernias noted.  No guarding or rebound tenderness.  Negative Carnett sign.   Rectal:  Deferred.  Msk:  Symmetrical without gross deformities.  Good, equal movement & strength bilaterally. Pulses:  Normal pulses noted. Extremities:  No clubbing or edema.  No cyanosis. Neurologic:  Alert and oriented x3;  grossly normal neurologically. Skin:  Intact without significant lesions or rashes.  No jaundice. Lymph Nodes:  No significant cervical adenopathy. Psych:  Alert and cooperative. Normal mood and affect.  Imaging Studies: No results found.  Assessment and Plan:   MPHILOMINA LEONis a 71y.o. y/o female who comes in today with a history of acid breakthrough on Prilosec. The patient has constipation associated with her pain medications. The patient also states that she has never had a colonoscopy and despite being set up for one in the past she does not remember why she did not have it done. The patient will be set up for a screening colonoscopy and will also be set up for an EGD due to her acid breakthrough. The patient has been given samples of Dexilant and will let me know how her symptoms are on the DLepanto I have discussed risks & benefits which include, but are not limited to, bleeding, infection, perforation & drug reaction.  The patient agrees with this plan & written consent will be obtained.     DLucilla Lame MD. FMarval Regal  Note: This dictation was prepared with Dragon dictation along with smaller phrase technology. Any transcriptional errors that result from this process are unintentional.

## 2017-04-03 NOTE — Patient Outreach (Signed)
[facility] Care Management  04/03/2017  [name] 1946/05/28 [id number]   RN Health Coach  Attempted #2 outreach call to patient.  Patient was unavailable. No voice mail pickup. Plan: RN will call patient again within 14 days.   [name] Verba Ainley BSN RN [facility] [phone]

## 2017-04-06 ENCOUNTER — Other Ambulatory Visit: Payer: Self-pay | Admitting: Family Medicine

## 2017-04-06 NOTE — Telephone Encounter (Signed)
Patient is now seeing GI Please ask her to contact his office for her reflux medicine, and that way there will only be one cook in the kitchen He may have changed her medicine dose or name of medicine altogether and we'll respectfully ask him to manage Thank you

## 2017-04-07 ENCOUNTER — Other Ambulatory Visit: Payer: Self-pay | Admitting: Family Medicine

## 2017-04-07 NOTE — Discharge Instructions (Signed)
General Anesthesia, Adult, Care After °These instructions provide you with information about caring for yourself after your procedure. Your health care provider may also give you more specific instructions. Your treatment has been planned according to current medical practices, but problems sometimes occur. Call your health care provider if you have any problems or questions after your procedure. °What can I expect after the procedure? °After the procedure, it is common to have: °· Vomiting. °· A sore throat. °· Mental slowness. ° °It is common to feel: °· Nauseous. °· Cold or shivery. °· Sleepy. °· Tired. °· Sore or achy, even in parts of your body where you did not have surgery. ° °Follow these instructions at home: °For at least 24 hours after the procedure: °· Do not: °? Participate in activities where you could fall or become injured. °? Drive. °? Use heavy machinery. °? Drink alcohol. °? Take sleeping pills or medicines that cause drowsiness. °? Make important decisions or sign legal documents. °? Take care of children on your own. °· Rest. °Eating and drinking °· If you vomit, drink water, juice, or soup when you can drink without vomiting. °· Drink enough fluid to keep your urine clear or pale yellow. °· Make sure you have little or no nausea before eating solid foods. °· Follow the diet recommended by your health care provider. °General instructions °· Have a responsible adult stay with you until you are awake and alert. °· Return to your normal activities as told by your health care provider. Ask your health care provider what activities are safe for you. °· Take over-the-counter and prescription medicines only as told by your health care provider. °· If you smoke, do not smoke without supervision. °· Keep all follow-up visits as told by your health care provider. This is important. °Contact a health care provider if: °· You continue to have nausea or vomiting at home, and medicines are not helpful. °· You  cannot drink fluids or start eating again. °· You cannot urinate after 8-12 hours. °· You develop a skin rash. °· You have fever. °· You have increasing redness at the site of your procedure. °Get help right away if: °· You have difficulty breathing. °· You have chest pain. °· You have unexpected bleeding. °· You feel that you are having a life-threatening or urgent problem. °This information is not intended to replace advice given to you by your health care provider. Make sure you discuss any questions you have with your health care provider. °Document Released: 11/14/2000 Document Revised: 01/11/2016 Document Reviewed: 07/23/2015 °Elsevier Interactive Patient Education © 2018 [employer] ° °

## 2017-04-07 NOTE — Telephone Encounter (Signed)
Pt notified. Pt also mention that her GI doctor will be trying her on a different medication she just want know until her next apt.

## 2017-04-07 NOTE — Telephone Encounter (Signed)
Please see other note; patient seeing GI doctor now

## 2017-04-07 NOTE — Telephone Encounter (Deleted)
Tried calling patient twice. No luck and not able to leave a message.

## 2017-04-08 NOTE — Anesthesia Preprocedure Evaluation (Addendum)
Anesthesia Evaluation  Patient identified by MRN, date of birth, ID band Patient awake    Reviewed: Allergy & Precautions, NPO status , Patient's Chart, lab work & pertinent test results  History of Anesthesia Complications Negative for: history of anesthetic complications  Airway Mallampati: I  TM Distance: >3 FB Neck ROM: Full    Dental  (+) Edentulous Upper, Edentulous Lower   Pulmonary former smoker (quit 12 years ago),  Snoring    Pulmonary exam normal breath sounds clear to auscultation       Cardiovascular Exercise Tolerance: Good hypertension, Normal cardiovascular exam Rhythm:Regular Rate:Normal  Myocardial perfusion 02/02/16: Abnormal myocardial perfusion scan evidence of anterior apical  lateral defect with possible ischemia ejection fraction 55% consider  invasive evaluation including cardiac cath  Echo 02/02/16: NORMAL LEFT VENTRICULAR SYSTOLIC FUNCTION WITH MILD LVH NORMAL RIGHT VENTRICULAR SYSTOLIC FUNCTION MILD VALVULAR REGURGITATION  NO VALVULAR STENOSIS EF 55-60%   Neuro/Psych  Headaches, Chronic pain s/p spinal cord stimulator implantation    GI/Hepatic GERD  ,  Endo/Other  diabetes, Type 2, Insulin Dependent  Renal/GU      Musculoskeletal  (+) Arthritis , Osteoarthritis,    Abdominal   Peds  Hematology negative hematology ROS (+)   Anesthesia Other Findings   Reproductive/Obstetrics                            Anesthesia Physical Anesthesia Plan  ASA: III  Anesthesia Plan: General   Post-op Pain Management:    Induction: Intravenous  PONV Risk Score and Plan: 2 and Ondansetron and Propofol infusion  Airway Management Planned: Natural Airway  Additional Equipment:   Intra-op Plan:   Post-operative Plan:   Informed Consent: I have reviewed the patients History and Physical, chart, labs and discussed the procedure including the risks, benefits and  alternatives for the proposed anesthesia with the patient or authorized representative who has indicated his/her understanding and acceptance.     Plan Discussed with: CRNA  Anesthesia Plan Comments:        Anesthesia Quick Evaluation

## 2017-04-10 ENCOUNTER — Ambulatory Visit
Admission: RE | Admit: 2017-04-10 | Discharge: 2017-04-10 | Disposition: A | Discharge status: Home / Self Care | Payer: Medicare HMO | Source: Ambulatory Visit | Attending: Gastroenterology | Admitting: Gastroenterology

## 2017-04-10 ENCOUNTER — Encounter
Admission: RE | Disposition: A | Discharge status: Home / Self Care | Payer: Self-pay | Source: Ambulatory Visit | Attending: Gastroenterology

## 2017-04-10 ENCOUNTER — Ambulatory Visit: Payer: Medicare HMO | Admitting: Anesthesiology

## 2017-04-10 DIAGNOSIS — E785 Hyperlipidemia, unspecified: Secondary | ICD-10-CM | POA: Insufficient documentation

## 2017-04-10 DIAGNOSIS — K219 Gastro-esophageal reflux disease without esophagitis: Secondary | ICD-10-CM | POA: Insufficient documentation

## 2017-04-10 DIAGNOSIS — K5900 Constipation, unspecified: Secondary | ICD-10-CM | POA: Diagnosis not present

## 2017-04-10 DIAGNOSIS — M1990 Unspecified osteoarthritis, unspecified site: Secondary | ICD-10-CM | POA: Diagnosis not present

## 2017-04-10 DIAGNOSIS — K640 First degree hemorrhoids: Secondary | ICD-10-CM | POA: Diagnosis not present

## 2017-04-10 DIAGNOSIS — Z79899 Other long term (current) drug therapy: Secondary | ICD-10-CM | POA: Diagnosis not present

## 2017-04-10 DIAGNOSIS — Z9689 Presence of other specified functional implants: Secondary | ICD-10-CM | POA: Diagnosis not present

## 2017-04-10 DIAGNOSIS — Z86010 Personal history of colonic polyps: Secondary | ICD-10-CM | POA: Diagnosis not present

## 2017-04-10 DIAGNOSIS — Z87891 Personal history of nicotine dependence: Secondary | ICD-10-CM | POA: Insufficient documentation

## 2017-04-10 DIAGNOSIS — K2970 Gastritis, unspecified, without bleeding: Secondary | ICD-10-CM | POA: Diagnosis not present

## 2017-04-10 DIAGNOSIS — Z96652 Presence of left artificial knee joint: Secondary | ICD-10-CM | POA: Insufficient documentation

## 2017-04-10 DIAGNOSIS — E559 Vitamin D deficiency, unspecified: Secondary | ICD-10-CM | POA: Diagnosis not present

## 2017-04-10 DIAGNOSIS — E119 Type 2 diabetes mellitus without complications: Secondary | ICD-10-CM | POA: Diagnosis not present

## 2017-04-10 DIAGNOSIS — I10 Essential (primary) hypertension: Secondary | ICD-10-CM | POA: Insufficient documentation

## 2017-04-10 DIAGNOSIS — Z794 Long term (current) use of insulin: Secondary | ICD-10-CM | POA: Insufficient documentation

## 2017-04-10 DIAGNOSIS — Z7189 Other specified counseling: Secondary | ICD-10-CM

## 2017-04-10 DIAGNOSIS — Z1211 Encounter for screening for malignant neoplasm of colon: Secondary | ICD-10-CM | POA: Diagnosis not present

## 2017-04-10 DIAGNOSIS — Z91048 Other nonmedicinal substance allergy status: Secondary | ICD-10-CM | POA: Insufficient documentation

## 2017-04-10 DIAGNOSIS — K5730 Diverticulosis of large intestine without perforation or abscess without bleeding: Secondary | ICD-10-CM | POA: Insufficient documentation

## 2017-04-10 HISTORY — DX: Headache: R51

## 2017-04-10 HISTORY — DX: Presence of other specified functional implants: Z96.89

## 2017-04-10 HISTORY — DX: Headache, unspecified: R51.9

## 2017-04-10 HISTORY — DX: Other specified postprocedural states: Z98.890

## 2017-04-10 HISTORY — DX: Presence of dental prosthetic device (complete) (partial): Z97.2

## 2017-04-10 HISTORY — PX: COLONOSCOPY WITH PROPOFOL: SHX5780

## 2017-04-10 LAB — GLUCOSE, CAPILLARY
Glucose-Capillary: 79 mg/dL (ref 65–99)
Glucose-Capillary: 73 mg/dL (ref 65–99)

## 2017-04-10 SURGERY — COLONOSCOPY WITH PROPOFOL
Anesthesia: General

## 2017-04-10 MED ORDER — GLYCOPYRROLATE 0.2 MG/ML IJ SOLN
INTRAMUSCULAR | Status: DC | PRN
  Administered 2017-04-10: 0.2 mg via INTRAVENOUS

## 2017-04-10 MED ORDER — SODIUM CHLORIDE 0.9 % IV SOLN: INTRAVENOUS | Status: DC

## 2017-04-10 MED ORDER — PROPOFOL 10 MG/ML IV BOLUS
INTRAVENOUS | Status: DC | PRN
  Administered 2017-04-10: 100 mg via INTRAVENOUS
  Administered 2017-04-10 (×2): 50 mg via INTRAVENOUS

## 2017-04-10 MED ORDER — LIDOCAINE HCL (CARDIAC) 20 MG/ML IV SOLN
INTRAVENOUS | Status: DC | PRN
  Administered 2017-04-10: 50 mg via INTRAVENOUS

## 2017-04-10 MED ORDER — LACTATED RINGERS IV SOLN
INTRAVENOUS | Status: DC
  Administered 2017-04-10: 07:00:00 via INTRAVENOUS

## 2017-04-10 MED ORDER — SIMETHICONE 40 MG/0.6ML PO SUSP
ORAL | Status: DC | PRN
  Administered 2017-04-10: 08:00:00

## 2017-04-10 MED ORDER — ONDANSETRON HCL 4 MG/2ML IJ SOLN: 4.0000 mg | Freq: Once | INTRAMUSCULAR | Status: DC | PRN

## 2017-04-10 SURGICAL SUPPLY — 23 items
CANISTER SUCT 1200ML W/VALVE (MISCELLANEOUS) ×2 IMPLANT
CLIP HMST 235XBRD CATH ROT (MISCELLANEOUS) IMPLANT
CLIP RESOLUTION 360 11X235 (MISCELLANEOUS)
FCP ESCP3.2XJMB 240X2.8X (MISCELLANEOUS)
FORCEPS BIOP RAD 4 LRG CAP 4 (CUTTING FORCEPS) IMPLANT
FORCEPS BIOP RJ4 240 W/NDL (MISCELLANEOUS)
FORCEPS ESCP3.2XJMB 240X2.8X (MISCELLANEOUS) IMPLANT
GOWN CVR UNV OPN BCK APRN NK (MISCELLANEOUS) ×2 IMPLANT
GOWN ISOL THUMB LOOP REG UNIV (MISCELLANEOUS) ×4
INJECTOR VARIJECT VIN23 (MISCELLANEOUS) IMPLANT
KIT DEFENDO VALVE AND CONN (KITS) IMPLANT
KIT ENDO PROCEDURE OLY (KITS) ×2 IMPLANT
MARKER SPOT ENDO TATTOO 5ML (MISCELLANEOUS) IMPLANT
PAD GROUND ADULT SPLIT (MISCELLANEOUS) IMPLANT
PROBE APC STR FIRE (PROBE) IMPLANT
RETRIEVER NET ROTH 2.5X230 LF (MISCELLANEOUS) IMPLANT
SNARE SHORT THROW 13M SML OVAL (MISCELLANEOUS) IMPLANT
SNARE SHORT THROW 30M LRG OVAL (MISCELLANEOUS) IMPLANT
SNARE SNG USE RND 15MM (INSTRUMENTS) IMPLANT
SPOT EX ENDOSCOPIC TATTOO (MISCELLANEOUS)
TRAP ETRAP POLY (MISCELLANEOUS) IMPLANT
VARIJECT INJECTOR VIN23 (MISCELLANEOUS)
WATER STERILE IRR 250ML POUR (IV SOLUTION) ×2 IMPLANT

## 2017-04-10 NOTE — H&P (Signed)
Lori Lame, MD [address] [place], [place] [address] Phone:[phone] Fax : [phone]  Primary Care Physician:  Arnetha Courser, MD Primary Gastroenterologist:  Dr. Allen Norris  Pre-Procedure History & Physical: HPI:  Lori Potts is a 71 y.o. female is here for an endoscopy and colonoscopy.   Past Medical History:  Diagnosis Date  . Arthritis   . 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 Cataract    right eye but immature  . Essential hypertension, benign    takes Lisinopril-HCTZ daily  . GERD (gastroesophageal reflux disease)   . Headache    sinus  . History of colon polyps    benign  . History of migraine   . History of shingles   . Hyperlipidemia    takes Lipitor daily  . Low back pain   . Nocturia   . S/P insertion of spinal cord stimulator   . Seasonal allergies    takes Singulair daily as needed  . Type II or unspecified type diabetes mellitus without mention of complication, not stated as uncontrolled   . Vitamin D deficiency    takes Vit D weekly  . Weakness    numbness and tingling left arm  . Wears dentures    full upper and lower    Past Surgical History:  Procedure Laterality Date  . ABDOMINAL HYSTERECTOMY    . ANTERIOR CERVICAL DECOMP/DISCECTOMY FUSION N/A 02/26/2015   Procedure: ANTERIOR CERVICAL DISCECTOMY FUSION C4-5 (1 LEVEL);  Surgeon: Melina Schools, MD;  Location: [facility];  Service: Orthopedics;  Laterality: N/A;  . ANTERIOR LAT LUMBAR FUSION N/A 03/21/2013   Procedure: ANTERIOR LATERAL LUMBAR FUSION 1 LEVEL/ XLIF L3-L4 ;  Surgeon: Melina Schools, MD;  Location: [place];  Service: Orthopedics;  Laterality: N/A;  . APPENDECTOMY    . BACK SURGERY    . BACK SURGERY     Lumbar fusion x 2  . BREAST SURGERY  1990   Augementation  . CARDIAC CATHETERIZATION  5/12   ef 55%  . CARDIAC CATHETERIZATION  10/2010   [facility]; EF 55%  . CARDIAC CATHETERIZATION Left 02/16/2016   Procedure: Left Heart Cath and Coronary Angiography;  Surgeon:  Yolonda Kida, MD;  Location: [facility];  Service: Cardiovascular;  Laterality: Left;  . COLONOSCOPY    . ESOPHAGOGASTRODUODENOSCOPY    . Gates   growth removed from left kidney   . pain stimulator    . POSTERIOR CERVICAL FUSION/FORAMINOTOMY Right 03/21/2013   Procedure: POSTERIOR L2-3 RIGHT FORAMINOTOMY;  Surgeon: Melina Schools, MD;  Location: [facility];  Service: Orthopedics;  Laterality: Right;  . REVISION OF SCAR TISSUE RECTUS MUSCLE    . SMALL BOWEL REPAIR    . SPINAL CORD STIMULATOR BATTERY EXCHANGE N/A 10/17/2012   Procedure: SPINAL CORD STIMULATOR BATTERY REMOVAL;  Surgeon: Melina Schools, MD;  Location: [place];  Service: Orthopedics;  Laterality: N/A;  . SPINAL CORD STIMULATOR BATTERY EXCHANGE N/A 07/22/2015   Procedure: REIMPLANTATION OF SPINAL CORD STIMULATOR BATTERY ;  Surgeon: Melina Schools, MD;  Location: [place];  Service: Orthopedics;  Laterality: N/A;  . TOTAL KNEE ARTHROPLASTY Left     Prior to Admission medications   Medication Sig Start Date End Date Taking? Authorizing Provider  ACCU-CHEK FASTCLIX LANCETS [place]  08/04/15  Yes [provider]  albuterol (PROVENTIL HFA;VENTOLIN HFA) 108 (90 Base) MCG/ACT inhaler Inhale 2 puffs into the lungs every 6 (six) hours as needed (for wheezing and shortness of breath from  seasonal allergies). 09/30/15  Yes Ashok Norris, MD  albuterol (PROVENTIL) (2.5 MG/3ML) 0.083% nebulizer solution  11/28/15  Yes [provider]  atorvastatin (LIPITOR) 40 MG tablet TAKE 1 TABLET (40 MG TOTAL) BY MOUTH AT BEDTIME. 03/24/17  Yes Lada, Satira Anis, MD  Blood Glucose Calibration (ACCU-CHEK SMARTVIEW CONTROL) LIQD  08/04/15  Yes [provider]  Blood Glucose Monitoring Suppl (ACCU-CHEK NANO SMARTVIEW) w/Device KIT CHECK BLOOD SUGAR TWICE DAILY 08/03/16  Yes Roselee Nova, MD  Dexlansoprazole (DEXILANT PO) Take by mouth daily.   Yes [provider]  flunisolide (NASALIDE) 25 MCG/ACT (0.025%) SOLN  Place 2 sprays into the nose 2 (two) times daily. 11/30/16  Yes Lada, Satira Anis, MD  glucose blood (ACCU-CHEK SMARTVIEW) test strip TEST BLOOD SUGARS 4 TIMES DAILY 08/07/15  Yes Ashok Norris, MD  ibuprofen (ADVIL,MOTRIN) 800 MG tablet Take 1 tablet (800 mg total) by mouth every 8 (eight) hours as needed. 11/29/16  Yes Lada, Satira Anis, MD  Insulin Degludec-Liraglutide (XULTOPHY) 100-3.6 UNIT-MG/ML SOPN Inject 12 Units into the skin at bedtime.   Yes [provider]  Insulin Pen Needle (BD ULTRA-FINE PEN NEEDLES) 29G X 12.7MM MISC 1 pen by Does not apply route once. 03/11/15  Yes Morrisey, Serita Grit, MD  ipratropium-albuterol (DUONEB) 0.5-2.5 (3) MG/3ML SOLN TAKE 3 MLS BY NEBULIZATION EVERY 4 (FOUR) HOURS AS NEEDED. 10/26/16  Yes Roselee Nova, MD  lisinopril-hydrochlorothiazide (PRINZIDE,ZESTORETIC) 20-25 MG tablet Take 1 tablet by mouth daily. 07/04/16  Yes Roselee Nova, MD  oxyCODONE-acetaminophen (PERCOCET) 10-325 MG tablet Take 1 tablet by mouth every 4 (four) hours as needed for pain.   Yes [provider]  tiZANidine (ZANAFLEX) 4 MG tablet  03/24/17  Yes [provider]  traZODone (DESYREL) 50 MG tablet Take 0.5-1 tablets (25-50 mg total) by mouth at bedtime as needed for sleep. 12/29/16  Yes Lada, Satira Anis, MD  montelukast (SINGULAIR) 10 MG tablet TAKE 1 TABLET EVERY DAY Patient not taking: Reported on 04/03/2017 10/21/15   Ashok Norris, MD  omeprazole (PRILOSEC) 20 MG capsule TAKE 1 CAPSULE (20 MG TOTAL) BY MOUTH 2 (TWO) TIMES DAILY BEFORE A MEAL. Patient not taking: Reported on 04/03/2017 03/01/17   Arnetha Courser, MD  polyethylene glycol powder (GLYCOLAX/MIRALAX) powder 2 cap fulls in a full glass of water, three times a day, for 5 days. Patient not taking: Reported on 04/03/2017 02/10/17   Carrie Mew, MD    Allergies as of 04/03/2017 - Review Complete 04/03/2017  Allergen Reaction Noted  . Adhesive [tape] Rash and Other (See Comments) 03/20/2013     Family History  Problem Relation Age of Onset  . Heart attack Mother   . Sarcoidosis Sister     Social History   Social History  . Marital status: Married    Spouse name: N/A  . Number of children: N/A  . Years of education: N/A   Occupational History  . Not on file.   Social History Main Topics  . Smoking status: Former Smoker    Types: Cigarettes    Quit date: 2006  . Smokeless tobacco: Never Used     Comment: quit smoking 49yr ago  . Alcohol use No  . Drug use: No  . Sexual activity: Yes    Birth control/ protection: Surgical   Other Topics Concern  . Not on file   Social History Narrative   ** Merged History Encounter **        Review of Systems: See  HPI, otherwise negative ROS  Physical Exam: BP 120/85   Pulse 79   Temp (!) 97.5 F (36.4 C) (Temporal)   Resp 16   Ht _0  (1.6 m)   Wt 171 lb (77.6 kg)   SpO2 100%   BMI 30.29 kg/m  General:   Alert,  pleasant and cooperative in NAD Head:  Normocephalic and atraumatic. Neck:  Supple; no masses or thyromegaly. Lungs:  Clear throughout to auscultation.    Heart:  Regular rate and rhythm. Abdomen:  Soft, nontender and nondistended. Normal bowel sounds, without guarding, and without rebound.   Neurologic:  Alert and  oriented x4;  grossly normal neurologically.  Impression/Plan: [name] is here for an endoscopy and colonoscopy to be performed for screening and GERD  Risks, benefits, limitations, and alternatives regarding  endoscopy and colonoscopy have been reviewed with the patient.  Questions have been answered.  All parties agreeable.   Lori Lame, MD  04/10/2017, 7:48 AM

## 2017-04-10 NOTE — Transfer of Care (Signed)
Immediate Anesthesia Transfer of Care Note  Patient: Lori Potts  Procedure(s) Performed: Procedure(s) with comments: COLONOSCOPY WITH PROPOFOL (N/A) - Diabetic - insulin  Patient Location: PACU  Anesthesia Type: General  Level of Consciousness: awake, alert  and patient cooperative  Airway and Oxygen Therapy: Patient Spontanous Breathing and Patient connected to supplemental oxygen  Post-op Assessment: Post-op Vital signs reviewed, Patient's Cardiovascular Status Stable, Respiratory Function Stable, Patent Airway and No signs of Nausea or vomiting  Post-op Vital Signs: Reviewed and stable  Complications: No apparent anesthesia complications

## 2017-04-10 NOTE — Anesthesia Procedure Notes (Signed)
Procedure Name: MAC Date/Time: 04/10/2017 7:53 AM Performed by: Janna Arch Pre-anesthesia Checklist: Patient identified, Emergency Drugs available, Suction available and Patient being monitored Patient Re-evaluated:Patient Re-evaluated prior to induction Oxygen Delivery Method: Nasal cannula

## 2017-04-10 NOTE — Op Note (Signed)
[facility] Gastroenterology Patient Name: Lori Potts Procedure Date: 04/10/2017 7:35 AM MRN: [id number] Account #: [id number] Date of Birth: [birth year] Admit Type: Outpatient Age: 71 Room: [facility] OR ROOM 1 Gender: Female Note Status: Finalized Procedure:            Upper GI endoscopy Indications:          Heartburn Providers:            Midge Minium MD, MD Referring MD:         Kerman Passey (Referring MD) Medicines:            Propofol per Anesthesia Complications:        No immediate complications. Procedure:            Pre-Anesthesia Assessment:                       - Prior to the procedure, a History and Physical was                        performed, and patient medications and allergies were                        reviewed. The patient's tolerance of previous                        anesthesia was also reviewed. The risks and benefits of                        the procedure and the sedation options and risks were                        discussed with the patient. All questions were                        answered, and informed consent was obtained. Prior                        Anticoagulants: The patient has taken no previous                        anticoagulant or antiplatelet agents. ASA Grade                        Assessment: II - A patient with mild systemic disease.                        After reviewing the risks and benefits, the patient was                        deemed in satisfactory condition to undergo the                        procedure.                       After obtaining informed consent, the endoscope was                        passed under direct vision. Throughout the procedure,  the patient's blood pressure, pulse, and oxygen                        saturations were monitored continuously. The Olympus                        190 Endoscope [id number]) was introduced through the                        mouth, and  advanced to the second part of duodenum. The                        upper GI endoscopy was accomplished without difficulty.                        The patient tolerated the procedure well. Findings:      The examined esophagus was normal.      Localized mild inflammation characterized by erythema was found in the       gastric antrum. Biopsies were taken with a cold forceps for histology.      The examined duodenum was normal. Impression:           - Normal esophagus.                       - Gastritis. Biopsied.                       - Normal examined duodenum. Recommendation:       - Discharge patient to home.                       - Resume previous diet.                       - Continue present medications.                       - Await pathology results. Procedure Code(s):    --- Professional ---                       [phone], Esophagogastroduodenoscopy, flexible, transoral;                        with biopsy, single or multiple Diagnosis Code(s):    --- Professional ---                       R12, Heartburn                       K29.70, Gastritis, unspecified, without bleeding CPT copyright 2016 American Medical Association. All rights reserved. The codes documented in this report are preliminary and upon coder review may  be revised to meet current compliance requirements. Midge Minium MD, MD 04/10/2017 8:02:24 AM This report has been signed electronically. Number of Addenda: 0 Note Initiated On: 04/10/2017 7:35 AM Total Procedure Duration: 0 hours 2 minutes 27 seconds       [facility]

## 2017-04-10 NOTE — Op Note (Signed)
[facility] Gastroenterology Patient Name: Lori Potts Procedure Date: 04/10/2017 7:34 AM MRN: [id number] Account #: [id number] Date of Birth: November 03, 1945 Admit Type: Outpatient Age: 71 Room: [facility] OR ROOM 01 Gender: Female Note Status: Finalized Procedure:            Colonoscopy Indications:          Screening for colorectal malignant neoplasm Providers:            Midge Minium MD, MD Medicines:            Propofol per Anesthesia Complications:        No immediate complications. Procedure:            Pre-Anesthesia Assessment:                       - Prior to the procedure, a History and Physical was                        performed, and patient medications and allergies were                        reviewed. The patient's tolerance of previous                        anesthesia was also reviewed. The risks and benefits of                        the procedure and the sedation options and risks were                        discussed with the patient. All questions were                        answered, and informed consent was obtained. Prior                        Anticoagulants: The patient has taken no previous                        anticoagulant or antiplatelet agents. ASA Grade                        Assessment: II - A patient with mild systemic disease.                        After reviewing the risks and benefits, the patient was                        deemed in satisfactory condition to undergo the                        procedure.                       After obtaining informed consent, the colonoscope was                        passed under direct vision. Throughout the procedure,                        the patient's blood pressure,  pulse, and oxygen                        saturations were monitored continuously. The Olympus                        190 Colonoscope [id number]) was introduced through the                        anus and advanced to the the  cecum, identified by                        appendiceal orifice and ileocecal valve. The                        colonoscopy was performed without difficulty. The                        patient tolerated the procedure well. The quality of                        the bowel preparation was excellent. Findings:      The perianal and digital rectal examinations were normal.      A few small-mouthed diverticula were found in the sigmoid colon.      Non-bleeding internal hemorrhoids were found during retroflexion. The       hemorrhoids were Grade I (internal hemorrhoids that do not prolapse). Impression:           - Diverticulosis in the sigmoid colon.                       - Non-bleeding internal hemorrhoids.                       - No specimens collected. Recommendation:       - Discharge patient to home.                       - Resume previous diet.                       - Continue present medications.                       - Repeat colonoscopy in 10 years for screening unless                        any change in family history or lower GI problems. Procedure Code(s):    --- Professional ---                       [phone], Colonoscopy, flexible; diagnostic, including                        collection of specimen(s) by brushing or washing, when                        performed (separate procedure) Diagnosis Code(s):    --- Professional ---                       Z12.11, Encounter for screening for malignant neoplasm  of colon CPT copyright 2016 American Medical Association. All rights reserved. The codes documented in this report are preliminary and upon coder review may  be revised to meet current compliance requirements. Midge Minium MD, MD 04/10/2017 8:16:56 AM This report has been signed electronically. Number of Addenda: 0 Note Initiated On: 04/10/2017 7:34 AM Scope Withdrawal Time: 0 hours 6 minutes 35 seconds  Total Procedure Duration: 0 hours 12 minutes 9 seconds        [facility]

## 2017-04-10 NOTE — Anesthesia Postprocedure Evaluation (Signed)
Anesthesia Post Note  Patient: Lori Potts  Procedure(s) Performed: Procedure(s) (LRB): COLONOSCOPY WITH PROPOFOL (N/A)  Patient location during evaluation: PACU Anesthesia Type: General Level of consciousness: awake and alert, oriented and patient cooperative Pain management: pain level controlled Vital Signs Assessment: post-procedure vital signs reviewed and stable Respiratory status: spontaneous breathing, nonlabored ventilation and respiratory function stable Cardiovascular status: blood pressure returned to baseline and stable Postop Assessment: adequate PO intake Anesthetic complications: no    Reed Breech

## 2017-04-11 ENCOUNTER — Encounter: Payer: Self-pay | Admitting: Gastroenterology

## 2017-04-11 ENCOUNTER — Other Ambulatory Visit: Payer: Self-pay | Admitting: Gastroenterology

## 2017-04-11 ENCOUNTER — Other Ambulatory Visit: Payer: Self-pay | Admitting: Family Medicine

## 2017-04-11 NOTE — Telephone Encounter (Signed)
I'm still getting prescription requests for her PPI See previous notes Please tell the pharmacy to quit sending these please Thank you

## 2017-04-11 NOTE — Telephone Encounter (Signed)
Informed the pharmacy that we do not prescribe any of her medications that has to do with GI since she has a GI doctor now. The pharm tech states she will go ahead and informed the Pharmacist.

## 2017-04-13 ENCOUNTER — Other Ambulatory Visit: Payer: Self-pay | Admitting: *Deleted

## 2017-04-13 NOTE — Patient Outreach (Signed)
[facility]) Care Management  04/13/2017   Lori Potts [birth year] [id number]  RN Health Coach telephone call to patient.  [employer] compliance verified. Per patient her fasting blood sugar was 81.  Per patient she has been put on a new insulin Xultophy and her blood sugars have been running lower,. Patient A1C is 9.4.  RN discussed the results of it being higher. Per patient she has lost weight. She has went from 187 pounds to 171 pounds. Per patient her husband is doing exercises so she is doing some exercising with him off and on. Patient stated she is not actually doing the chair exercises as she should. RN discussed with patient about going to the [facility] and per patient her time is limited with her husband on dialysis. Patient has a follow up appointment in October with Dr for additional labs to be drawn.  Patient has agreed to follow up outreach calls.    Current Medications:  Current Outpatient Prescriptions  Medication Sig Dispense Refill  . ACCU-CHEK FASTCLIX LANCETS MISC     . albuterol (PROVENTIL HFA;VENTOLIN HFA) 108 (90 Base) MCG/ACT inhaler Inhale 2 puffs into the lungs every 6 (six) hours as needed (for wheezing and shortness of breath from seasonal allergies). 1 Inhaler 1  . albuterol (PROVENTIL) (2.5 MG/3ML) 0.083% nebulizer solution     . atorvastatin (LIPITOR) 40 MG tablet TAKE 1 TABLET (40 MG TOTAL) BY MOUTH AT BEDTIME. 30 tablet 0  . Blood Glucose Calibration (ACCU-CHEK SMARTVIEW CONTROL) LIQD     . Blood Glucose Monitoring Suppl (ACCU-CHEK NANO SMARTVIEW) w/Device KIT CHECK BLOOD SUGAR TWICE DAILY 1 kit 0  . Dexlansoprazole (DEXILANT PO) Take by mouth daily.    . flunisolide (NASALIDE) 25 MCG/ACT (0.025%) SOLN Place 2 sprays into the nose 2 (two) times daily. 1 Bottle 0  . glucose blood (ACCU-CHEK SMARTVIEW) test strip TEST BLOOD SUGARS 4 TIMES DAILY 150 each PRN  . ibuprofen (ADVIL,MOTRIN) 800 MG tablet Take 1 tablet (800 mg total) by mouth every 8 (eight)  hours as needed. 30 tablet 0  . Insulin Degludec-Liraglutide (XULTOPHY) 100-3.6 UNIT-MG/ML SOPN Inject 12 Units into the skin at bedtime.    . Insulin Pen Needle (BD ULTRA-FINE PEN NEEDLES) 29G X 12.7MM MISC 1 pen by Does not apply route once. 100 each 3  . ipratropium-albuterol (DUONEB) 0.5-2.5 (3) MG/3ML SOLN TAKE 3 MLS BY NEBULIZATION EVERY 4 (FOUR) HOURS AS NEEDED. 360 mL 3  . lisinopril-hydrochlorothiazide (PRINZIDE,ZESTORETIC) 20-25 MG tablet Take 1 tablet by mouth daily. 90 tablet 1  . montelukast (SINGULAIR) 10 MG tablet TAKE 1 TABLET EVERY DAY (Patient not taking: Reported on 04/03/2017) 90 tablet 1  . oxyCODONE-acetaminophen (PERCOCET) 10-325 MG tablet Take 1 tablet by mouth every 4 (four) hours as needed for pain.    . polyethylene glycol powder (GLYCOLAX/MIRALAX) powder 2 cap fulls in a full glass of water, three times a day, for 5 days. (Patient not taking: Reported on 04/03/2017) 255 g 0  . tiZANidine (ZANAFLEX) 4 MG tablet     . traZODone (DESYREL) 50 MG tablet Take 0.5-1 tablets (25-50 mg total) by mouth at bedtime as needed for sleep. 90 tablet 1   No current facility-administered medications for this visit.     Functional Status:  In your present state of health, do you have any difficulty performing the following activities: 04/13/2017 04/10/2017  Hearing? N N  Vision? N N  Comment - -  Difficulty concentrating or making decisions? N N  Walking or  climbing stairs? N N  Dressing or bathing? N N  Doing errands, shopping? N -  Preparing Food and eating ? N -  Using the Toilet? N -  In the past six months, have you accidently leaked urine? Y -  Do you have problems with loss of bowel control? N -  Managing your Medications? N -  Managing your Finances? N -  Housekeeping or managing your Housekeeping? N -  Some recent data might be hidden    Fall/Depression Screening: Fall Risk  04/13/2017 02/10/2017 02/03/2017  Falls in the past year? Yes No Yes  Number falls in past yr: 1  - 1  Injury with Fall? No - No  Risk for fall due to : Impaired mobility;Impaired balance/gait;History of fall(s) - -  Follow up Falls evaluation completed;Education provided;Falls prevention discussed - -   PHQ 2/9 Scores 04/13/2017 02/10/2017 02/03/2017 01/26/2017 11/29/2016 11/29/2016 10/20/2016  PHQ - 2 Score 0 0 0 0 0 0 0   [facility] CM Care Plan Problem One     Most Recent Value  Care Plan Problem One  Knowledge Deficit in Self Management of Diabetes  Role Documenting the Problem One  [name] for Problem One  Active  [facility] Long Term Goal   Patient will see A1C decrease with next blood draw within the next 90 days  [facility] Long Term Goal Start Date  04/13/17  Interventions for Problem One Long Term Goal  RN discussed A1C goals. RN discussed fasting blood glucose range needs to be 80- 130 to see the A1C range < 7  [facility] CM Short Term Goal #2   Patient will report developing a routine exercise program within the next 30 days  Interventions for Short Term Goal #2  RN discussed with patient about doing chair exercises. RN sent a picture chart of chair exercises. RN will follow up with next outreach  [employer] CM Short Term Goal #3  Patient will report eating healthier snacks within the next 30 days  Interventions for Short Tern Goal #3  RN sent patient a list of healthy snacks to eat. RN discussed as patient shopping to pick up some of the items as her snack. RN willfollow up with discussion and teachback.       Assessment:  Patient A1C is 9.4 Patient lost 10 pounds Patient is trying to eat healthier Patient insulin changed to Xultophy Patient will continue to benefit from [facility] telephonic outreach for education and support for diabetes self management.  Plan:  RN discussed A1C increase RN discussd exercises RN sent additional educational material on excercising RN will follow up within the month of October  [facility] [phone]

## 2017-04-20 ENCOUNTER — Telehealth: Payer: Self-pay | Admitting: Family Medicine

## 2017-04-20 ENCOUNTER — Other Ambulatory Visit: Payer: Self-pay | Admitting: Family Medicine

## 2017-04-20 DIAGNOSIS — E7800 Pure hypercholesterolemia, unspecified: Secondary | ICD-10-CM

## 2017-04-20 NOTE — Telephone Encounter (Signed)
Please follow-up with patient about the outstanding mammogram order We strongly encourage breast cancer screening Breast cancer is the 2nd leading cause of death in American women from cancer (#1 in Hispanic women in this country), and early detection saves lives Thank you 

## 2017-04-20 NOTE — Telephone Encounter (Signed)
Please remind patient that she was due for a fasting cholesterol panel (ordered July 12th) Please ask her to have that done as soon as convenient I'll send the refill, but we want to make sure the dose is correct Thank you

## 2017-04-21 NOTE — Telephone Encounter (Signed)
Patient notified

## 2017-04-21 NOTE — Telephone Encounter (Signed)
Lori MuirJamie spoke with the pt regarding having fasting labs done.

## 2017-05-04 ENCOUNTER — Ambulatory Visit (INDEPENDENT_AMBULATORY_CARE_PROVIDER_SITE_OTHER): Payer: Medicare HMO | Admitting: Emergency Medicine

## 2017-05-04 ENCOUNTER — Ambulatory Visit
Admission: RE | Admit: 2017-05-04 | Discharge: 2017-05-04 | Disposition: A | Discharge status: Home / Self Care | Payer: Medicare HMO | Source: Ambulatory Visit | Attending: Family Medicine | Admitting: Family Medicine

## 2017-05-04 DIAGNOSIS — Z794 Long term (current) use of insulin: Secondary | ICD-10-CM | POA: Diagnosis not present

## 2017-05-04 DIAGNOSIS — E1165 Type 2 diabetes mellitus with hyperglycemia: Secondary | ICD-10-CM | POA: Diagnosis not present

## 2017-05-04 DIAGNOSIS — Z1231 Encounter for screening mammogram for malignant neoplasm of breast: Secondary | ICD-10-CM | POA: Diagnosis not present

## 2017-05-04 DIAGNOSIS — IMO0001 Reserved for inherently not codable concepts without codable children: Secondary | ICD-10-CM

## 2017-05-04 LAB — POCT GLYCOSYLATED HEMOGLOBIN (HGB A1C): Hemoglobin A1C: 6.6

## 2017-05-04 NOTE — Progress Notes (Signed)
Patient verbalized  Understanding  that it maybe a charge from insurance  due to repeating A1c early

## 2017-05-08 ENCOUNTER — Other Ambulatory Visit: Payer: Self-pay

## 2017-05-08 DIAGNOSIS — E7800 Pure hypercholesterolemia, unspecified: Secondary | ICD-10-CM

## 2017-05-08 NOTE — Telephone Encounter (Signed)
Ins requesting 90 day supply 

## 2017-05-09 ENCOUNTER — Ambulatory Visit (INDEPENDENT_AMBULATORY_CARE_PROVIDER_SITE_OTHER): Payer: Medicare HMO | Admitting: Emergency Medicine

## 2017-05-09 DIAGNOSIS — Z23 Encounter for immunization: Secondary | ICD-10-CM

## 2017-05-12 ENCOUNTER — Ambulatory Visit: Payer: Medicare HMO | Admitting: *Deleted

## 2017-05-18 ENCOUNTER — Ambulatory Visit (INDEPENDENT_AMBULATORY_CARE_PROVIDER_SITE_OTHER): Payer: Medicare HMO | Admitting: Family Medicine

## 2017-05-18 ENCOUNTER — Encounter: Payer: Self-pay | Admitting: Family Medicine

## 2017-05-18 VITALS — BP 130/70 | HR 88 | Temp 98.7°F | Resp 16 | Ht 63.0 in | Wt 172.3 lb

## 2017-05-18 DIAGNOSIS — Z8639 Personal history of other endocrine, nutritional and metabolic disease: Secondary | ICD-10-CM

## 2017-05-18 DIAGNOSIS — R51 Headache: Secondary | ICD-10-CM

## 2017-05-18 DIAGNOSIS — R519 Headache, unspecified: Secondary | ICD-10-CM | POA: Insufficient documentation

## 2017-05-18 DIAGNOSIS — IMO0001 Reserved for inherently not codable concepts without codable children: Secondary | ICD-10-CM

## 2017-05-18 DIAGNOSIS — E1165 Type 2 diabetes mellitus with hyperglycemia: Secondary | ICD-10-CM

## 2017-05-18 DIAGNOSIS — Z79899 Other long term (current) drug therapy: Secondary | ICD-10-CM

## 2017-05-18 DIAGNOSIS — J0101 Acute recurrent maxillary sinusitis: Secondary | ICD-10-CM | POA: Diagnosis not present

## 2017-05-18 DIAGNOSIS — Z794 Long term (current) use of insulin: Secondary | ICD-10-CM

## 2017-05-18 DIAGNOSIS — M542 Cervicalgia: Secondary | ICD-10-CM

## 2017-05-18 DIAGNOSIS — E559 Vitamin D deficiency, unspecified: Secondary | ICD-10-CM

## 2017-05-18 MED ORDER — PROMETHAZINE HCL 25 MG/ML IJ SOLN
12.5000 mg | Freq: Once | INTRAMUSCULAR | Status: AC
  Administered 2017-05-18: 12.5 mg via INTRAMUSCULAR

## 2017-05-18 MED ORDER — KETOROLAC TROMETHAMINE 60 MG/2ML IM SOLN: 60.0000 mg | Freq: Once | INTRAMUSCULAR | Status: DC

## 2017-05-18 MED ORDER — KETOROLAC TROMETHAMINE 60 MG/2ML IM SOLN: 30.0000 mg | Freq: Once | INTRAMUSCULAR | Status: DC

## 2017-05-18 MED ORDER — KETOROLAC TROMETHAMINE 60 MG/2ML IM SOLN
30.0000 mg | Freq: Once | INTRAMUSCULAR | Status: AC
  Administered 2017-05-18: 30 mg via INTRAMUSCULAR

## 2017-05-18 MED ORDER — LEVOCETIRIZINE DIHYDROCHLORIDE 5 MG PO TABS: 5.0000 mg | ORAL_TABLET | Freq: Every evening | ORAL | 1 refills | Status: DC

## 2017-05-18 NOTE — Progress Notes (Addendum)
Name: Lori Potts   MRN: [id number]    DOB: 1946/01/26   Date:05/18/2017       Progress Note  Subjective  Chief Complaint  Chief Complaint  Patient presents with  . Headache    HPI  - Lori Potts presents with complaint of ongoing acute on chronic headaches.  Headaches occur almost daily to every other day.  Endorses photophobia, nasal congestion, nausea and occasional vomiting, poor mood/easily irritated during episodes.  She reports an aura of brief dizziness about 45 minutes to an hour prior to headache onset.  Has tried percocet, ibuprofen, used to have injections which she is unsure of the medication that was injected.  - Today she enodorses headache that has been ongoing for 2 weeks with waxing and waning pain; she is photophobic and nauseous, headache is frontal/sinus area and is 8/10 pain. Father in-law passed away two weeks ago and she thinks this stress may have triggered this current headache.  She endorses nasal congestion with moderate sinus pressure. Denies phonophobia, confusion, slurred speech, or extremity weakness; no sore throat, fevers/chills, ear pain/pressure, chest pain, or shortness of breath.  - Most recent A1C was 6.6 - checks BG's at home and they are running 84-101 - can feel when BG's are low and eats a snack during that time, she does not feel that this is contributory.  Has history of elevated BP's, hasn't taken HTN medication in over 7 months. - Saw Dr. Virgia Land ENT for evaluation of recurrent sinusitis; he recommends CT sinuses to determine if chronic sinusitis is present.  Dr. Virgia Land recommends referral to neurologist if CT is negative.  She is unable to afford her CT sinus right now but plans to save up to have this performed. She is using nasal spray, no longer taking singulair, and does not take antihistamine. - Pt also requests her Vitamin D level today - questions if this could be cause. - CMP and CBC from June 2018 were unremarkable - we will check  additional labs today.  Patient Active Problem List   Diagnosis Date Noted  . Special screening for malignant neoplasms, colon   . Gastroesophageal reflux disease   . Gastritis without bleeding   . 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 Insomnia 11/29/2016  . 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 Vasomotor flushing 04/19/2016  . Abdominal wall pain in right flank 03/29/2016  . Diverticulosis 02/24/2016  . Hypercholesterolemia 06/16/2015  . FHx: migraine headaches 04/13/2015  . Chronic pain 04/13/2015  . Neck pain 02/26/2015  . Status post lumbar surgery 02/26/2015  . Abnormal ECG 01/27/2015  . Angina pectoris ([place]) 01/27/2015  . Chest pain 01/27/2015  . Diabetes mellitus type 2, uncontrolled, without complications ([place]) 63/78/5885  . BP (high blood pressure) 01/27/2015    Social History  Substance Use Topics  . Smoking status: Former Smoker    Types: Cigarettes    Quit date: 2006  . Smokeless tobacco: Never Used     Comment: quit smoking 57yr ago  . Alcohol use No     Current Outpatient Prescriptions:  .  ACCU-CHEK FASTCLIX LANCETS MISC, , Disp: , Rfl:  .  albuterol (PROVENTIL HFA;VENTOLIN HFA) 108 (90 Base) MCG/ACT inhaler, Inhale 2 puffs into the lungs every 6 (six) hours as needed (for wheezing and shortness of breath from seasonal allergies)., Disp: 1 Inhaler, Rfl: 1 .  albuterol (PROVENTIL) (2.5 MG/3ML) 0.083% nebulizer solution, , Disp: , Rfl:  .  atorvastatin (LIPITOR) 40 MG  tablet, TAKE 1 TABLET BY MOUTH EVERYDAY AT BEDTIME, Disp: 30 tablet, Rfl: 0 .  Blood Glucose Calibration (ACCU-CHEK SMARTVIEW CONTROL) LIQD, , Disp: , Rfl:  .  Blood Glucose Monitoring Suppl (ACCU-CHEK NANO SMARTVIEW) w/Device KIT, CHECK BLOOD SUGAR TWICE DAILY, Disp: 1 kit, Rfl: 0 .  Dexlansoprazole (DEXILANT PO), Take by mouth daily., Disp: , Rfl:  .  flunisolide (NASALIDE) 25 MCG/ACT (0.025%) SOLN, Place 2 sprays into the nose 2 (two) times daily.,  Disp: 1 Bottle, Rfl: 0 .  glucose blood (ACCU-CHEK SMARTVIEW) test strip, TEST BLOOD SUGARS 4 TIMES DAILY, Disp: 150 each, Rfl: PRN .  ibuprofen (ADVIL,MOTRIN) 800 MG tablet, Take 1 tablet (800 mg total) by mouth every 8 (eight) hours as needed., Disp: 30 tablet, Rfl: 0 .  Insulin Degludec-Liraglutide (XULTOPHY) 100-3.6 UNIT-MG/ML SOPN, Inject 12 Units into the skin at bedtime., Disp: , Rfl:  .  Insulin Pen Needle (BD ULTRA-FINE PEN NEEDLES) 29G X 12.7MM MISC, 1 pen by Does not apply route once., Disp: 100 each, Rfl: 3 .  ipratropium-albuterol (DUONEB) 0.5-2.5 (3) MG/3ML SOLN, TAKE 3 MLS BY NEBULIZATION EVERY 4 (FOUR) HOURS AS NEEDED., Disp: 360 mL, Rfl: 3 .  lisinopril-hydrochlorothiazide (PRINZIDE,ZESTORETIC) 20-25 MG tablet, Take 1 tablet by mouth daily., Disp: 90 tablet, Rfl: 1 .  montelukast (SINGULAIR) 10 MG tablet, TAKE 1 TABLET EVERY DAY, Disp: 90 tablet, Rfl: 1 .  oxyCODONE-acetaminophen (PERCOCET) 10-325 MG tablet, Take 1 tablet by mouth every 4 (four) hours as needed for pain., Disp: , Rfl:  .  polyethylene glycol powder (GLYCOLAX/MIRALAX) powder, 2 cap fulls in a full glass of water, three times a day, for 5 days., Disp: 255 g, Rfl: 0 .  tiZANidine (ZANAFLEX) 4 MG tablet, , Disp: , Rfl:  .  traZODone (DESYREL) 50 MG tablet, Take 0.5-1 tablets (25-50 mg total) by mouth at bedtime as needed for sleep., Disp: 90 tablet, Rfl: 1  Allergies  Allergen Reactions  . Adhesive [Tape] Rash and Other (See Comments)    Regular tape is ok, allergy is to paper tape    ROS  Ten systems reviewed and is negative except as mentioned in HPI  Objective  Vitals:   05/18/17 1405  BP: 130/70  Pulse: 88  Resp: 16  Temp: 98.7 F (37.1 C)  TempSrc: Oral  SpO2: 95%  Weight: 172 lb 4.8 oz (78.2 kg)  Height: '5\' 3"'$  (1.6 m)   Body mass index is 30.52 kg/m.  Nursing Note and Vital Signs reviewed.  Physical Exam  Constitutional: Patient appears well-developed and well-nourished. Obese  No  distress.  HEENT: head atraumatic, normocephalic, pupils equal and reactive to light, EOM's intact, TM's without erythema or bulging, mild maxillary and frontal sinus tenderness on LEFT side only, neck supple without lymphadenopathy, oropharynx pink and moist without exudate Cardiovascular: Normal rate, regular rhythm, S1/S2 present.  No murmur or rub heard. No BLE edema. Pulmonary/Chest: Effort normal and breath sounds clear. No respiratory distress or retractions. Psychiatric: Patient has a normal mood and affect. behavior is normal. Judgment and thought content normal. Neurological: she is alert and oriented to person, place, and time. No cranial nerve deficit. Coordination, balance, strength, speech and gait are normal. Face is symmetric, grip strength and extremity strength equal bilaterally.   Recent Results (from the past 2160 hour(s))  POCT HgB A1C     Status: Abnormal   Collection Time: 03/02/17  4:16 PM  Result Value Ref Range   Hemoglobin A1C 9.4   Glucose, capillary  Status: None   Collection Time: 04/10/17  7:05 AM  Result Value Ref Range   Glucose-Capillary 79 65 - 99 mg/dL  Glucose, capillary     Status: None   Collection Time: 04/10/17  8:24 AM  Result Value Ref Range   Glucose-Capillary 73 65 - 99 mg/dL  POCT HgB A1C     Status: None   Collection Time: 05/04/17 11:57 AM  Result Value Ref Range   Hemoglobin A1C 6.6      Assessment & Plan  1. Recurrent maxillary sinusitis Patient cannot currently afford CT Sinuses. She is planning to save up and have this performed in the coming months.  If this is negative for chronic sinusitis, we will refer to neurology. - levocetirizine (XYZAL) 5 MG tablet; Take 1 tablet (5 mg total) by mouth every evening.  Dispense: 30 tablet; Refill: 1 - Continue nasal spray, try Xyzal daily to help decongest the sinuses - recommend drinking plenty of water as well.  2. Chronic nonintractable headache, unspecified headache type -  Magnesium - Vitamin B12 - promethazine (PHENERGAN) injection 12.5 mg; Inject 0.5 mLs (12.5 mg total) into the muscle once. - ketorolac (TORADOL) injection 30 mg; Inject 1 mL (30 mg total) into the muscle once. - Headache triggers and diet discussed in detail - hand out provided and patient will try an elimination diet to determine if there are any dietary triggers. - Due to finances, pt would like to wait on neurology referral until she is able to complete the CT Sinuses and complete her follow up with ENT. - Recommend patient get plenty of rest, drink plenty of water, avoid triggers, try relaxation techniques like taking a walk, meditation for 15-20 minutes a day.  3. History of vitamin D deficiency - VITAMIN D 25 Hydroxy (Vit-D Deficiency, Fractures)  4. Uncontrolled type 2 diabetes mellitus without complication, with long-term current use of insulin (HCC) - Continue to monitor BG's, if below 90 and feeling bad, eat snack with complex carbohydrate and a protein.  Encouraged patient to keep log of BG's for [place] to assess at her next visit in 1 month.  Explained that preventing extreme highs and lows in BG's can help prevent some headaches.  -Red flags and when to present for emergency care or RTC including fever >101.56F, worst headache of your life/thunderclap headache, confusion, weakness, facial droop, speech changes, vision changes, chest pain, shortness of breath, new/worsening/un-resolving symptoms, reviewed with patient at time of visit. Follow up and care instructions discussed and provided in AVS. - Return in about 1 month (around 06/17/2017) for Headache follow up with Dr. Sanda Klein.  I have reviewed this encounter including the documentation in this note and/or discussed this patient with the Johney Maine, [name], NP-C. I am certifying that I agree with the content of this note as supervising physician.  Steele Sizer, MD [facility] 05/18/2017,  10:46 PM

## 2017-05-18 NOTE — Patient Instructions (Addendum)
Sinus Headache A sinus headache happens when your sinuses become clogged or swollen. You may feel pain or pressure in your face, forehead, ears, or upper teeth. Sinus headaches can be mild or severe. Follow these instructions at home:  Take medicines only as told by your doctor.  If you were given an antibiotic medicine, finish all of it even if you start to feel better.  Use a nose spray if you feel stuffed up (congested).  If told, apply a warm, moist washcloth to your face to help lessen pain. Contact a doctor if:  You get headaches more than one time each week.  Light or sound bothers you.  You have a fever.  You feel sick to your stomach (nauseous) or you throw up (vomit).  Your headaches do not get better with treatment. Get help right away if:  You have trouble seeing.  You suddenly have very bad pain in your face or head.  You start to twitch or shake (seizure).  You are confused.  You have a stiff neck. This information is not intended to replace advice given to you by your health care provider. Make sure you discuss any questions you have with your health care provider. Document Released: 12/08/2010 Document Revised: 04/03/2016 Document Reviewed: 08/04/2014 Elsevier Interactive Patient Education  2018 Elsevier Inc.  

## 2017-05-19 ENCOUNTER — Other Ambulatory Visit: Payer: Self-pay | Admitting: Family Medicine

## 2017-05-19 ENCOUNTER — Encounter: Payer: Self-pay | Admitting: Family Medicine

## 2017-05-19 DIAGNOSIS — E559 Vitamin D deficiency, unspecified: Secondary | ICD-10-CM

## 2017-05-19 LAB — MAGNESIUM: Magnesium: 2.2 mg/dL (ref 1.5–2.5)

## 2017-05-19 LAB — VITAMIN B12: Vitamin B-12: 463 pg/mL (ref 200–1100)

## 2017-05-19 LAB — VITAMIN D 25 HYDROXY (VIT D DEFICIENCY, FRACTURES): Vit D, 25-Hydroxy: 23 ng/mL — ABNORMAL LOW (ref 30–100)

## 2017-05-19 MED ORDER — VITAMIN D (CHOLECALCIFEROL) 25 MCG (1000 UT) PO TABS: 1000.0000 [IU] | ORAL_TABLET | Freq: Every day | ORAL | 3 refills | Status: DC

## 2017-05-22 DIAGNOSIS — M1711 Unilateral primary osteoarthritis, right knee: Secondary | ICD-10-CM | POA: Diagnosis not present

## 2017-05-25 ENCOUNTER — Other Ambulatory Visit: Payer: Self-pay

## 2017-05-25 DIAGNOSIS — E7800 Pure hypercholesterolemia, unspecified: Secondary | ICD-10-CM

## 2017-05-25 MED ORDER — ATORVASTATIN CALCIUM 40 MG PO TABS: 40.0000 mg | ORAL_TABLET | Freq: Every day | ORAL | 0 refills | Status: DC

## 2017-05-25 NOTE — Telephone Encounter (Signed)
Left detailed voicemail

## 2017-05-25 NOTE — Telephone Encounter (Signed)
Please see phone note from Aug 30th; she still needs to have her cholesterol drawn please; I need to make sure this is the right strength Thank you

## 2017-05-26 ENCOUNTER — Ambulatory Visit
Admission: RE | Admit: 2017-05-26 | Discharge: 2017-05-26 | Disposition: A | Discharge status: Home / Self Care | Payer: Medicare HMO | Source: Ambulatory Visit | Attending: Family Medicine | Admitting: Family Medicine

## 2017-05-26 ENCOUNTER — Ambulatory Visit (INDEPENDENT_AMBULATORY_CARE_PROVIDER_SITE_OTHER): Payer: Medicare HMO | Admitting: Family Medicine

## 2017-05-26 ENCOUNTER — Encounter: Payer: Self-pay | Admitting: Family Medicine

## 2017-05-26 VITALS — BP 128/70 | HR 87 | Temp 97.5°F | Resp 14 | Wt 170.7 lb

## 2017-05-26 DIAGNOSIS — I700 Atherosclerosis of aorta: Secondary | ICD-10-CM

## 2017-05-26 DIAGNOSIS — Z1159 Encounter for screening for other viral diseases: Secondary | ICD-10-CM

## 2017-05-26 DIAGNOSIS — E1165 Type 2 diabetes mellitus with hyperglycemia: Secondary | ICD-10-CM

## 2017-05-26 DIAGNOSIS — R05 Cough: Secondary | ICD-10-CM

## 2017-05-26 DIAGNOSIS — R9431 Abnormal electrocardiogram [ECG] [EKG]: Secondary | ICD-10-CM | POA: Diagnosis not present

## 2017-05-26 DIAGNOSIS — E7800 Pure hypercholesterolemia, unspecified: Secondary | ICD-10-CM

## 2017-05-26 DIAGNOSIS — J309 Allergic rhinitis, unspecified: Secondary | ICD-10-CM | POA: Diagnosis not present

## 2017-05-26 DIAGNOSIS — Z23 Encounter for immunization: Secondary | ICD-10-CM | POA: Diagnosis not present

## 2017-05-26 DIAGNOSIS — R059 Cough, unspecified: Secondary | ICD-10-CM

## 2017-05-26 DIAGNOSIS — Z01818 Encounter for other preprocedural examination: Secondary | ICD-10-CM | POA: Diagnosis not present

## 2017-05-26 DIAGNOSIS — IMO0001 Reserved for inherently not codable concepts without codable children: Secondary | ICD-10-CM

## 2017-05-26 LAB — CBC WITH DIFFERENTIAL/PLATELET
Basophils Absolute: 41 cells/uL (ref 0–200)
Basophils Relative: 1 %
Eosinophils Absolute: 443 cells/uL (ref 15–500)
Eosinophils Relative: 10.8 %
HCT: 37 % (ref 35.0–45.0)
Hemoglobin: 12.3 g/dL (ref 11.7–15.5)
Lymphs Abs: 1160 cells/uL (ref 850–3900)
MCH: 25.8 pg — ABNORMAL LOW (ref 27.0–33.0)
MCHC: 33.2 g/dL (ref 32.0–36.0)
MCV: 77.7 fL — ABNORMAL LOW (ref 80.0–100.0)
MPV: 10.4 fL (ref 7.5–12.5)
Monocytes Relative: 5.9 %
Neutro Abs: 2214 cells/uL (ref 1500–7800)
Neutrophils Relative %: 54 %
Platelets: 376 10*3/uL (ref 140–400)
RBC: 4.76 10*6/uL (ref 3.80–5.10)
RDW: 14.6 % (ref 11.0–15.0)
Total Lymphocyte: 28.3 %
WBC mixed population: 242 cells/uL (ref 200–950)
WBC: 4.1 10*3/uL (ref 3.8–10.8)

## 2017-05-26 LAB — LIPID PANEL
Cholesterol: 206 mg/dL — ABNORMAL HIGH (ref <200)
HDL: 67 mg/dL (ref >50)
LDL Cholesterol (Calc): 124 mg/dL (calc) — ABNORMAL HIGH
Non-HDL Cholesterol (Calc): 139 mg/dL (calc) — ABNORMAL HIGH (ref <130)
Total CHOL/HDL Ratio: 3.1 (calc) (ref <5.0)
Triglycerides: 61 mg/dL (ref <150)

## 2017-05-26 LAB — COMPLETE METABOLIC PANEL WITH GFR
AG Ratio: 1.4 (calc) (ref 1.0–2.5)
ALT: 11 U/L (ref 6–29)
AST: 14 U/L (ref 10–35)
Albumin: 4 g/dL (ref 3.6–5.1)
Alkaline phosphatase (APISO): 94 U/L (ref 33–130)
BUN: 14 mg/dL (ref 7–25)
CO2: 27 mmol/L (ref 20–32)
Calcium: 9.6 mg/dL (ref 8.6–10.4)
Chloride: 106 mmol/L (ref 98–110)
Creat: 0.63 mg/dL (ref 0.60–0.93)
GFR, Est African American: 105 mL/min/{1.73_m2} (ref >=60)
GFR, Est Non African American: 90 mL/min/{1.73_m2} (ref >=60)
Globulin: 2.8 g/dL (calc) (ref 1.9–3.7)
Glucose, Bld: 78 mg/dL (ref 65–99)
Potassium: 4 mmol/L (ref 3.5–5.3)
Sodium: 141 mmol/L (ref 135–146)
Total Bilirubin: 0.4 mg/dL (ref 0.2–1.2)
Total Protein: 6.8 g/dL (ref 6.1–8.1)

## 2017-05-26 MED ORDER — MONTELUKAST SODIUM 10 MG PO TABS: 10.0000 mg | ORAL_TABLET | Freq: Every day | ORAL | 3 refills | Status: DC

## 2017-05-26 NOTE — Assessment & Plan Note (Signed)
Continue nasal corticosteroid, xyzal; add singulair

## 2017-05-26 NOTE — Progress Notes (Signed)
BP 128/70   Pulse 87   Temp (!) 97.5 F (36.4 C) (Oral)   Resp 14   Wt 170 lb 11.2 oz (77.4 kg)   SpO2 96%   BMI 30.24 kg/m    Subjective:    Patient ID: Lori Potts, female    DOB: 07/05/1946, 71 y.o.   MRN: [id number]  HPI: Lori Potts is a 71 y.o. female  Chief Complaint  Patient presents with  . Surgical clearance    HPI  Planning for right TKA; already had the left TKA No prior problems with anesthesia No fevers No burning with urination No boils or sores No rash over the knee No hx of MRSA No chest pain No fluid in legs Not a free bleeder No blood in urine or stool She will get clearance from her cardiologist Having some sinus drainage and a little postnasal drip and cough; needs something to loosen it up No wheezing Using flonase Can tolerate rehab and activity, no limitations except for knee pain No problems with pain medicine post-operatively One level home; help at home, husband is retired Diabetes; no dry mouth or blurred vision Lab Results  Component Value Date   HGBA1C 6.6 05/04/2017   Will check Hep C today  Depression screen [facility] 2/9 05/26/2017 04/13/2017 02/10/2017 02/03/2017 01/26/2017  Decreased Interest 0 0 0 0 0  Down, Depressed, Hopeless 0 0 0 0 0  PHQ - 2 Score 0 0 0 0 0    Relevant past medical, surgical, family and social history reviewed Past Medical History:  Diagnosis Date  . Arthritis   . 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 Cataract    right eye but immature  . Essential hypertension, benign    takes Lisinopril-HCTZ daily  . GERD (gastroesophageal reflux disease)   . Headache    sinus  . History of colon polyps    benign  . History of migraine   . History of shingles   . Hyperlipidemia    takes Lipitor daily  . Low back pain   . Nocturia   . S/P insertion of spinal cord stimulator   . Seasonal allergies    takes Singulair daily as needed  . Type II or unspecified type diabetes mellitus without mention  of complication, not stated as uncontrolled   . Vitamin D deficiency    takes Vit D weekly  . Weakness    numbness and tingling left arm  . Wears dentures    full upper and lower   Past Surgical History:  Procedure Laterality Date  . ABDOMINAL HYSTERECTOMY    . ANTERIOR CERVICAL DECOMP/DISCECTOMY FUSION N/A 02/26/2015   Procedure: ANTERIOR CERVICAL DISCECTOMY FUSION C4-5 (1 LEVEL);  Surgeon: Venita Lick, MD;  Location: [facility];  Service: Orthopedics;  Laterality: N/A;  . ANTERIOR LAT LUMBAR FUSION N/A 03/21/2013   Procedure: ANTERIOR LATERAL LUMBAR FUSION 1 LEVEL/ XLIF L3-L4 ;  Surgeon: Venita Lick, MD;  Location: [facility] OR;  Service: Orthopedics;  Laterality: N/A;  . APPENDECTOMY    . AUGMENTATION MAMMAPLASTY Bilateral 1978  . BACK SURGERY    . BACK SURGERY     Lumbar fusion x 2  . BREAST EXCISIONAL BIOPSY Right 1970  . BREAST SURGERY  1990   Augementation  . CARDIAC CATHETERIZATION  5/12   ef 55%  . CARDIAC CATHETERIZATION  10/2010   [facility]; EF 55%  . CARDIAC CATHETERIZATION Left 02/16/2016   Procedure: Left Heart Cath and Coronary Angiography;  Surgeon: Gerda Diss  Salome Arnt, MD;  Location: [facility] INVASIVE CV LAB;  Service: Cardiovascular;  Laterality: Left;  . COLONOSCOPY    . COLONOSCOPY WITH PROPOFOL N/A 04/10/2017   Procedure: COLONOSCOPY WITH PROPOFOL;  Surgeon: Midge Minium, MD;  Location: [facility];  Service: Gastroenterology;  Laterality: N/A;  Diabetic - insulin  . ESOPHAGOGASTRODUODENOSCOPY    . KIDNEY SURGERY  1998   growth removed from left kidney   . pain stimulator    . POSTERIOR CERVICAL FUSION/FORAMINOTOMY Right 03/21/2013   Procedure: POSTERIOR L2-3 RIGHT FORAMINOTOMY;  Surgeon: Venita Lick, MD;  Location: [facility] OR;  Service: Orthopedics;  Laterality: Right;  . REVISION OF SCAR TISSUE RECTUS MUSCLE    . SMALL BOWEL REPAIR    . SPINAL CORD STIMULATOR BATTERY EXCHANGE N/A 10/17/2012   Procedure: SPINAL CORD STIMULATOR BATTERY REMOVAL;  Surgeon: Venita Lick, MD;   Location: [facility] OR;  Service: Orthopedics;  Laterality: N/A;  . SPINAL CORD STIMULATOR BATTERY EXCHANGE N/A 07/22/2015   Procedure: REIMPLANTATION OF SPINAL CORD STIMULATOR BATTERY ;  Surgeon: Venita Lick, MD;  Location: [facility] OR;  Service: Orthopedics;  Laterality: N/A;  . TOTAL KNEE ARTHROPLASTY Left    Family History  Problem Relation Age of Onset  . Heart attack Mother   . Sarcoidosis Sister    Social History   Social History  . Marital status: Married    Spouse name: N/A  . Number of children: N/A  . Years of education: N/A   Occupational History  . Not on file.   Social History Main Topics  . Smoking status: Former Smoker    Types: Cigarettes    Quit date: 2006  . Smokeless tobacco: Never Used     Comment: quit smoking 40yrs ago  . Alcohol use No  . Drug use: No  . Sexual activity: Yes    Birth control/ protection: Surgical   Other Topics Concern  . Not on file   Social History Narrative   ** Merged History Encounter **       Interim medical history since last visit reviewed. Allergies and medications reviewed  Review of Systems Per HPI unless specifically indicated above     Objective:    BP 128/70   Pulse 87   Temp (!) 97.5 F (36.4 C) (Oral)   Resp 14   Wt 170 lb 11.2 oz (77.4 kg)   SpO2 96%   BMI 30.24 kg/m   Wt Readings from Last 3 Encounters:  05/26/17 170 lb 11.2 oz (77.4 kg)  05/18/17 172 lb 4.8 oz (78.2 kg)  04/10/17 171 lb (77.6 kg)    Physical Exam  Constitutional: She appears well-developed and well-nourished. No distress.  HENT:  Head: Normocephalic and atraumatic.  Right Ear: Hearing and external ear normal.  Left Ear: Hearing and external ear normal.  Nose: Rhinorrhea present.  Mouth/Throat: Oropharynx is clear and moist and mucous membranes are normal.  Eyes: EOM are normal. No scleral icterus.  Neck: No thyromegaly present.  Cardiovascular: Normal rate, regular rhythm and normal heart sounds.   No murmur  heard. Pulmonary/Chest: Effort normal and breath sounds normal. No respiratory distress. She has no decreased breath sounds. She has no wheezes. She has no rhonchi. She has no rales.  Abdominal: Soft. Bowel sounds are normal. She exhibits no distension.  Musculoskeletal: Normal range of motion. She exhibits no edema.  Neurological: She is alert. She displays no tremor. She exhibits normal muscle tone.  Skin: Skin is warm and dry. She is not diaphoretic. No  pallor.  Psychiatric: She has a normal mood and affect. Her behavior is normal. Judgment and thought content normal.   Diabetic Foot Form - Detailed   Diabetic Foot Exam - detailed Diabetic Foot exam was performed with the following findings:  Yes 05/26/2017  8:48 AM  Visual Foot Exam completed.:  Yes  Pulse Foot Exam completed.:  Yes  Right Dorsalis Pedis:  Present Left Dorsalis Pedis:  Present  Sensory Foot Exam Completed.:  Yes Semmes-Weinstein Monofilament Test R Site 1-Great Toe:  Pos L Site 1-Great Toe:  Pos       Results for orders placed or performed in visit on 05/18/17  VITAMIN D 25 Hydroxy (Vit-D Deficiency, Fractures)  Result Value Ref Range   Vit D, 25-Hydroxy 23 (L) 30 - 100 ng/mL  Magnesium  Result Value Ref Range   Magnesium 2.2 1.5 - 2.5 mg/dL  Vitamin W11  Result Value Ref Range   Vitamin B-12 463 200 - 1,100 pg/mL      Assessment & Plan:   Problem List Items Addressed This Visit      Cardiovascular and Mediastinum   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HCC)     Respiratory   Allergic rhinitis    Continue nasal corticosteroid, xyzal; add singulair        Endocrine   Diabetes mellitus type 2, uncontrolled, without complications (HCC)    Well-controlled; sugars are not so high that I expect any problems with healing or infection        Other   Hypercholesterolemia    Check lipids today; goal LDL less than 100      Abnormal ECG    Patient will be getting cardiac clearance from her cardiologist        Other Visit Diagnoses    Pre-op evaluation    -  Primary   Relevant Orders   DG Chest 2 View   CBC with Differential/Platelet   COMPLETE METABOLIC PANEL WITH GFR   Cough       will get CXR for pre-op clearance, but suspect this is from her postnasal drip   Relevant Orders   DG Chest 2 View   Need for 23-polyvalent pneumococcal polysaccharide vaccine       would prefer patient have this at least 4 weeks after flu vaccine; she may return for this later; already had the PCV-13   Encounter for hepatitis C screening test for low risk patient       Relevant Orders   Hepatitis C Antibody       Follow up plan: No Follow-up on file.  An after-visit summary was printed and given to the patient at check-out.  Please see the patient instructions which may contain other information and recommendations beyond what is mentioned above in the assessment and plan.  Meds ordered this encounter  Medications  . montelukast (SINGULAIR) 10 MG tablet    Sig: Take 1 tablet (10 mg total) by mouth at bedtime.    Dispense:  30 tablet    Refill:  3    Orders Placed This Encounter  Procedures  . DG Chest 2 View  . Hepatitis C Antibody  . CBC with Differential/Platelet  . COMPLETE METABOLIC PANEL WITH GFR

## 2017-05-26 NOTE — Assessment & Plan Note (Signed)
Well-controlled; sugars are not so high that I expect any problems with healing or infection

## 2017-05-26 NOTE — Patient Instructions (Addendum)
Plan to start miralax or colace after surgery to help prevent constipation Add the singulair Please return for the pneumonia vaccine 4 weeks after the flu shot

## 2017-05-26 NOTE — Assessment & Plan Note (Signed)
Check lipids today; goal LDL less than 100

## 2017-05-26 NOTE — Assessment & Plan Note (Signed)
Patient will be getting cardiac clearance from her cardiologist

## 2017-05-27 LAB — HEPATITIS C ANTIBODY
Hepatitis C Ab: NONREACTIVE
SIGNAL TO CUT-OFF: 0.01 (ref <1.00)

## 2017-05-29 ENCOUNTER — Other Ambulatory Visit: Payer: Self-pay | Admitting: Family Medicine

## 2017-05-29 DIAGNOSIS — E7800 Pure hypercholesterolemia, unspecified: Secondary | ICD-10-CM

## 2017-05-29 MED ORDER — ATORVASTATIN CALCIUM 40 MG PO TABS: 40.0000 mg | ORAL_TABLET | Freq: Every day | ORAL | 1 refills | Status: DC

## 2017-06-02 ENCOUNTER — Encounter: Payer: Self-pay | Admitting: Family Medicine

## 2017-06-02 ENCOUNTER — Ambulatory Visit (INDEPENDENT_AMBULATORY_CARE_PROVIDER_SITE_OTHER): Payer: Medicare HMO | Admitting: Family Medicine

## 2017-06-02 VITALS — BP 140/86 | HR 90 | Ht 63.0 in | Wt 170.3 lb

## 2017-06-02 DIAGNOSIS — R05 Cough: Secondary | ICD-10-CM

## 2017-06-02 DIAGNOSIS — R062 Wheezing: Secondary | ICD-10-CM

## 2017-06-02 DIAGNOSIS — R058 Other specified cough: Secondary | ICD-10-CM

## 2017-06-02 DIAGNOSIS — J0140 Acute pansinusitis, unspecified: Secondary | ICD-10-CM

## 2017-06-02 MED ORDER — MOMETASONE FUROATE 50 MCG/ACT NA SUSP: 2.0000 | Freq: Every day | NASAL | 0 refills | Status: DC

## 2017-06-02 MED ORDER — BENZONATATE 200 MG PO CAPS: 200.0000 mg | ORAL_CAPSULE | Freq: Two times a day (BID) | ORAL | 0 refills | Status: DC | PRN

## 2017-06-02 MED ORDER — AMOXICILLIN-POT CLAVULANATE 875-125 MG PO TABS: 1.0000 | ORAL_TABLET | Freq: Two times a day (BID) | ORAL | 0 refills | Status: DC

## 2017-06-02 NOTE — Progress Notes (Signed)
Name: Lori Potts   MRN: [id number]    DOB: Apr 07, 1946   Date:06/02/2017       Progress Note  Subjective  Chief Complaint  Chief Complaint  Patient presents with  . Cough    Cough  This is a recurrent problem. The current episode started in the past 7 days. The problem has been unchanged. The cough is productive of sputum. Associated symptoms include nasal congestion, postnasal drip, rhinorrhea and wheezing. Pertinent negatives include no chills, fever, headaches, sore throat or shortness of breath. The symptoms are aggravated by cold air (started after change in weather). She has tried a beta-agonist inhaler for the symptoms.     Past Medical History:  Diagnosis Date  . Arthritis   . 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 Cataract    right eye but immature  . Essential hypertension, benign    takes Lisinopril-HCTZ daily  . GERD (gastroesophageal reflux disease)   . Headache    sinus  . History of colon polyps    benign  . History of migraine   . History of shingles   . Hyperlipidemia    takes Lipitor daily  . Low back pain   . Nocturia   . S/P insertion of spinal cord stimulator   . Seasonal allergies    takes Singulair daily as needed  . Type II or unspecified type diabetes mellitus without mention of complication, not stated as uncontrolled   . Vitamin D deficiency    takes Vit D weekly  . Weakness    numbness and tingling left arm  . Wears dentures    full upper and lower    Past Surgical History:  Procedure Laterality Date  . ABDOMINAL HYSTERECTOMY    . ANTERIOR CERVICAL DECOMP/DISCECTOMY FUSION N/A 02/26/2015   Procedure: ANTERIOR CERVICAL DISCECTOMY FUSION C4-5 (1 LEVEL);  Surgeon: Venita Lick, MD;  Location: [facility] OR;  Service: Orthopedics;  Laterality: N/A;  . ANTERIOR LAT LUMBAR FUSION N/A 03/21/2013   Procedure: ANTERIOR LATERAL LUMBAR FUSION 1 LEVEL/ XLIF L3-L4 ;  Surgeon: Venita Lick, MD;  Location: [facility] OR;  Service: Orthopedics;  Laterality:  N/A;  . APPENDECTOMY    . AUGMENTATION MAMMAPLASTY Bilateral 1978  . BACK SURGERY    . BACK SURGERY     Lumbar fusion x 2  . BREAST EXCISIONAL BIOPSY Right 1970  . BREAST SURGERY  1990   Augementation  . CARDIAC CATHETERIZATION  5/12   ef 55%  . CARDIAC CATHETERIZATION  10/2010   [facility]; EF 55%  . CARDIAC CATHETERIZATION Left 02/16/2016   Procedure: Left Heart Cath and Coronary Angiography;  Surgeon: Alwyn Pea, MD;  Location: [facility] INVASIVE CV LAB;  Service: Cardiovascular;  Laterality: Left;  . COLONOSCOPY    . COLONOSCOPY WITH PROPOFOL N/A 04/10/2017   Procedure: COLONOSCOPY WITH PROPOFOL;  Surgeon: Midge Minium, MD;  Location: [facility];  Service: Gastroenterology;  Laterality: N/A;  Diabetic - insulin  . ESOPHAGOGASTRODUODENOSCOPY    . KIDNEY SURGERY  1998   growth removed from left kidney   . pain stimulator    . POSTERIOR CERVICAL FUSION/FORAMINOTOMY Right 03/21/2013   Procedure: POSTERIOR L2-3 RIGHT FORAMINOTOMY;  Surgeon: Venita Lick, MD;  Location: [facility] OR;  Service: Orthopedics;  Laterality: Right;  . REVISION OF SCAR TISSUE RECTUS MUSCLE    . SMALL BOWEL REPAIR    . SPINAL CORD STIMULATOR BATTERY EXCHANGE N/A 10/17/2012   Procedure: SPINAL CORD STIMULATOR BATTERY REMOVAL;  Surgeon: Venita Lick, MD;  Location:  [place] OR;  Service: Orthopedics;  Laterality: N/A;  . SPINAL CORD STIMULATOR BATTERY EXCHANGE N/A 07/22/2015   Procedure: REIMPLANTATION OF SPINAL CORD STIMULATOR BATTERY ;  Surgeon: Melina Schools, MD;  Location: [place];  Service: Orthopedics;  Laterality: N/A;  . TOTAL KNEE ARTHROPLASTY Left     Family History  Problem Relation Age of Onset  . Heart attack Mother   . Sarcoidosis Sister     Social History   Social History  . Marital status: Married    Spouse name: N/A  . Number of children: N/A  . Years of education: N/A   Occupational History  . Not on file.   Social History Main Topics  . Smoking status: Former Smoker    Types: Cigarettes     Quit date: 2006  . Smokeless tobacco: Never Used     Comment: quit smoking 53yr ago  . Alcohol use No  . Drug use: No  . Sexual activity: Yes    Birth control/ protection: Surgical   Other Topics Concern  . Not on file   Social History Narrative   ** Merged History Encounter **         Current Outpatient Prescriptions:  .  ACCU-CHEK FASTCLIX LANCETS MISC, , Disp: , Rfl:  .  albuterol (PROVENTIL HFA;VENTOLIN HFA) 108 (90 Base) MCG/ACT inhaler, Inhale 2 puffs into the lungs every 6 (six) hours as needed (for wheezing and shortness of breath from seasonal allergies)., Disp: 1 Inhaler, Rfl: 1 .  albuterol (PROVENTIL) (2.5 MG/3ML) 0.083% nebulizer solution, , Disp: , Rfl:  .  atorvastatin (LIPITOR) 40 MG tablet, Take 1 tablet (40 mg total) by mouth at bedtime., Disp: 90 tablet, Rfl: 1 .  Blood Glucose Calibration (ACCU-CHEK SMARTVIEW CONTROL) LIQD, , Disp: , Rfl:  .  Blood Glucose Monitoring Suppl (ACCU-CHEK NANO SMARTVIEW) w/Device KIT, CHECK BLOOD SUGAR TWICE DAILY, Disp: 1 kit, Rfl: 0 .  Dexlansoprazole (DEXILANT PO), Take 40 mg by mouth daily. , Disp: , Rfl:  .  flunisolide (NASALIDE) 25 MCG/ACT (0.025%) SOLN, Place 2 sprays into the nose 2 (two) times daily., Disp: 1 Bottle, Rfl: 0 .  glucose blood (ACCU-CHEK SMARTVIEW) test strip, TEST BLOOD SUGARS 4 TIMES DAILY, Disp: 150 each, Rfl: PRN .  Insulin Degludec-Liraglutide (XULTOPHY) 100-3.6 UNIT-MG/ML SOPN, Inject 12 Units into the skin at bedtime., Disp: , Rfl:  .  Insulin Pen Needle (BD ULTRA-FINE PEN NEEDLES) 29G X 12.7MM MISC, 1 pen by Does not apply route once., Disp: 100 each, Rfl: 3 .  ipratropium-albuterol (DUONEB) 0.5-2.5 (3) MG/3ML SOLN, TAKE 3 MLS BY NEBULIZATION EVERY 4 (FOUR) HOURS AS NEEDED., Disp: 360 mL, Rfl: 3 .  levocetirizine (XYZAL) 5 MG tablet, Take 1 tablet (5 mg total) by mouth every evening., Disp: 30 tablet, Rfl: 1 .  montelukast (SINGULAIR) 10 MG tablet, Take 1 tablet (10 mg total) by mouth at bedtime.,  Disp: 30 tablet, Rfl: 3 .  oxyCODONE-acetaminophen (PERCOCET) 10-325 MG tablet, Take 1 tablet by mouth every 4 (four) hours as needed for pain., Disp: , Rfl:  .  traZODone (DESYREL) 50 MG tablet, Take 0.5-1 tablets (25-50 mg total) by mouth at bedtime as needed for sleep., Disp: 90 tablet, Rfl: 1 .  Vitamin D, Cholecalciferol, 1000 units TABS, Take 1,000 Units by mouth daily., Disp: 90 tablet, Rfl: 3  Allergies  Allergen Reactions  . Adhesive [Tape] Rash and Other (See Comments)    Regular tape is ok, allergy is to paper tape     Review of  Systems  Constitutional: Negative for chills and fever.  HENT: Positive for postnasal drip and rhinorrhea. Negative for sore throat.   Respiratory: Positive for cough and wheezing. Negative for shortness of breath.   Neurological: Negative for headaches.      Objective  Vitals:   06/02/17 1006  BP: 140/86  Pulse: 90  Weight: 170 lb 4.8 oz (77.2 kg)  Height: '5\' 3"'$  (1.6 m)    Physical Exam  Constitutional: She is well-developed, well-nourished, and in no distress.  HENT:  Head: Normocephalic and atraumatic.  Right Ear: Tympanic membrane and ear canal normal. No drainage or swelling.  Left Ear: Tympanic membrane and ear canal normal. No drainage or swelling.  Nose: Right sinus exhibits maxillary sinus tenderness and frontal sinus tenderness. Left sinus exhibits maxillary sinus tenderness and frontal sinus tenderness.  Mouth/Throat: Oropharynx is clear and moist. No posterior oropharyngeal erythema.  Nasal mucosal inflammation, turbinate hypertrophy.  Cardiovascular: Normal rate, regular rhythm and normal heart sounds.   No murmur heard. Pulmonary/Chest: She has wheezes in the right lower field and the left lower field.  Nursing note and vitals reviewed.   Assessment & Plan  1. Acute pansinusitis, recurrence not specified By history and exam, start on Augmentin for 10 days, mometasone for relief of nasal congestion -  amoxicillin-clavulanate (AUGMENTIN) 875-125 MG tablet; Take 1 tablet by mouth 2 (two) times daily.  Dispense: 20 tablet; Refill: 0 - mometasone (NASONEX) 50 MCG/ACT nasal spray; Place 2 sprays into the nose daily.  Dispense: 17 g; Refill: 0   2. Bilateral wheezing Patient has COPD, advised to use duo nebs as prescribed by PCP  3. Productive cough Likely because of postnasal drainage. Recent chest x-ray was negative - mometasone (NASONEX) 50 MCG/ACT nasal spray; Place 2 sprays into the nose daily.  Dispense: 17 g; Refill: 0 - benzonatate (TESSALON) 200 MG capsule; Take 1 capsule (200 mg total) by mouth 2 (two) times daily as needed for cough.  Dispense: 20 capsule; Refill: 0   Ivorie Uplinger Asad A. [facility] 06/02/2017 10:40 AM

## 2017-06-07 ENCOUNTER — Telehealth: Payer: Self-pay | Admitting: Emergency Medicine

## 2017-06-07 NOTE — Telephone Encounter (Signed)
Patient called back and asked for additional medication. Spoke to Diamond BeachEmily NP. Patient was informed to go to ER due to her shortness of breath

## 2017-06-07 NOTE — Telephone Encounter (Signed)
Would like something stronger than than iprat-albuterol 0.5-3 called in for nebulizer. Still coughing worse than when she came in

## 2017-06-08 NOTE — Telephone Encounter (Signed)
Documentation reviewed. Reviewed patient's information yesterday 06/07/2017, and she was experiencing shortness of breath unrelieved by duoneb treatments. Advised she go to ER for further evaluation.

## 2017-06-12 ENCOUNTER — Other Ambulatory Visit: Payer: Self-pay | Admitting: *Deleted

## 2017-06-12 NOTE — Patient Outreach (Signed)
[facility]) Care Management  06/12/2017   Lori Potts 23-Mar-1946 [id number]  RN Health Coach telephone call to patient.  [employer] compliance verified. Per patient she is following her diet and is eating smaller portions. Per patient she has arthritis and pain in the rt knee so she  has not been following through with the exercises.  She is having  knee replacement in December. Patient has been having several episodes of sinusitis. She has been on antibiotics. Patient was started on a new diabetes medication and her blood A1C is 6.6 05/04/2017  from 9.4. Patient has taken her flu shot and is following up with the pneumonia shot. Patient has met her goal for her a!C. RN will continue to follow up outreach through the surgical process . Patient has agreed to further outreach calls.   Current Medications:  Current Outpatient Prescriptions  Medication Sig Dispense Refill  . ACCU-CHEK FASTCLIX LANCETS MISC     . albuterol (PROVENTIL HFA;VENTOLIN HFA) 108 (90 Base) MCG/ACT inhaler Inhale 2 puffs into the lungs every 6 (six) hours as needed (for wheezing and shortness of breath from seasonal allergies). 1 Inhaler 1  . albuterol (PROVENTIL) (2.5 MG/3ML) 0.083% nebulizer solution     . amoxicillin-clavulanate (AUGMENTIN) 875-125 MG tablet Take 1 tablet by mouth 2 (two) times daily. 20 tablet 0  . atorvastatin (LIPITOR) 40 MG tablet Take 1 tablet (40 mg total) by mouth at bedtime. 90 tablet 1  . benzonatate (TESSALON) 200 MG capsule Take 1 capsule (200 mg total) by mouth 2 (two) times daily as needed for cough. 20 capsule 0  . Blood Glucose Calibration (ACCU-CHEK SMARTVIEW CONTROL) LIQD     . Blood Glucose Monitoring Suppl (ACCU-CHEK NANO SMARTVIEW) w/Device KIT CHECK BLOOD SUGAR TWICE DAILY 1 kit 0  . Dexlansoprazole (DEXILANT PO) Take 40 mg by mouth daily.     . flunisolide (NASALIDE) 25 MCG/ACT (0.025%) SOLN Place 2 sprays into the nose 2 (two) times daily. 1 Bottle 0  . glucose  blood (ACCU-CHEK SMARTVIEW) test strip TEST BLOOD SUGARS 4 TIMES DAILY 150 each PRN  . Insulin Degludec-Liraglutide (XULTOPHY) 100-3.6 UNIT-MG/ML SOPN Inject 12 Units into the skin at bedtime.    . Insulin Pen Needle (BD ULTRA-FINE PEN NEEDLES) 29G X 12.7MM MISC 1 pen by Does not apply route once. 100 each 3  . ipratropium-albuterol (DUONEB) 0.5-2.5 (3) MG/3ML SOLN TAKE 3 MLS BY NEBULIZATION EVERY 4 (FOUR) HOURS AS NEEDED. 360 mL 3  . levocetirizine (XYZAL) 5 MG tablet Take 1 tablet (5 mg total) by mouth every evening. 30 tablet 1  . mometasone (NASONEX) 50 MCG/ACT nasal spray Place 2 sprays into the nose daily. 17 g 0  . montelukast (SINGULAIR) 10 MG tablet Take 1 tablet (10 mg total) by mouth at bedtime. 30 tablet 3  . oxyCODONE-acetaminophen (PERCOCET) 10-325 MG tablet Take 1 tablet by mouth every 4 (four) hours as needed for pain.    . traZODone (DESYREL) 50 MG tablet Take 0.5-1 tablets (25-50 mg total) by mouth at bedtime as needed for sleep. 90 tablet 1  . Vitamin D, Cholecalciferol, 1000 units TABS Take 1,000 Units by mouth daily. 90 tablet 3   No current facility-administered medications for this visit.     Functional Status:  In your present state of health, do you have any difficulty performing the following activities: 06/12/2017 05/26/2017  Hearing? N -  Vision? Y Y  Comment - -  Difficulty concentrating or making decisions? N N  Walking  or climbing stairs? N N  Dressing or bathing? N N  Doing errands, shopping? N N  Preparing Food and eating ? N -  Using the Toilet? N -  In the past six months, have you accidently leaked urine? Y -  Do you have problems with loss of bowel control? N -  Managing your Medications? N -  Managing your Finances? N -  Housekeeping or managing your Housekeeping? N -  Some recent data might be hidden    Fall/Depression Screening: Fall Risk  06/12/2017 05/26/2017 04/13/2017  Falls in the past year? No No Yes  Number falls in past yr: 1 - 1   Injury with Fall? No - No  Risk for fall due to : Impaired balance/gait;Impaired mobility - Impaired mobility;Impaired balance/gait;History of fall(s)  Follow up Education provided;Falls evaluation completed;Falls prevention discussed - Falls evaluation completed;Education provided;Falls prevention discussed   PHQ 2/9 Scores 06/12/2017 05/26/2017 04/13/2017 02/10/2017 02/03/2017 01/26/2017 11/29/2016  PHQ - 2 Score 0 0 0 0 0 0 0   THN CM Care Plan Problem One     Most Recent Value  Care Plan Problem One  Knowledge Deficit in Self Management of Diabetes  Care Plan for Problem One  Active  THN Long Term Goal   Patient will see A1C decrease with next blood draw within the next 90 days  THN Long Term Goal Start Date  06/12/17  Interventions for Problem One Long Term Goal  RN discussed A1C goals. RN discussed fasting blood glucose range needs to be 80- 130 to see the A1C range < 7  THN CM Short Term Goal #1   Patient will verbalize understandin how to use the incentive spirometry before surgery  THN CM Short Term Goal #1 Start Date  06/12/17  Interventions for Short Term Goal #1  RN sent patient educational material on incentive spirometry. RN will follow up with patient before her surgery for discussion and teach back.  THN CM Short Term Goal #3  Patient will report eating healthier snacks within the next 30 days  THN CM Short Term Goal #3 Start Date  06/12/17  Interventions for Short Tern Goal #3  RN sent patient a list of healthy snacks to eat. RN discussed as patient shopping to pick up some of the items as her snack. RN willfollow up with discussion and teachback.       Assessment:  A1C 6.6 Patient received flu shot Patient is using portion control Patient scheduled knee replacement Patient weight is slowly decreasing with healthier eating, portion control and new diabetes medication Plan:  RN sent educational material on using incentive spirometry Patient sent information on Human dine  well meals RN discussed portion control RN  discussed making right snack choices RN discussed flu and pneumonia vaccines RN will follow up within the month of December   Diaz Crago [place] Management [phone]

## 2017-06-13 ENCOUNTER — Encounter: Payer: Self-pay | Admitting: *Deleted

## 2017-06-21 ENCOUNTER — Other Ambulatory Visit: Payer: Self-pay

## 2017-06-21 ENCOUNTER — Telehealth: Payer: Self-pay | Admitting: Gastroenterology

## 2017-06-21 MED ORDER — DEXLANSOPRAZOLE 60 MG PO CPDR: 60.0000 mg | DELAYED_RELEASE_CAPSULE | Freq: Every day | ORAL | 6 refills | Status: DC

## 2017-06-21 NOTE — Telephone Encounter (Signed)
Rx for Dexilant 60mg  sent to pt's pharmacy.

## 2017-06-21 NOTE — Telephone Encounter (Signed)
Patient left a voice message that Dr. Servando SnareWohl gave her some samples of a acid reflux medication and she would like to get a RX. Please call her at [phone]

## 2017-06-26 ENCOUNTER — Ambulatory Visit: Payer: Self-pay | Admitting: Orthopedic Surgery

## 2017-06-27 ENCOUNTER — Telehealth: Payer: Self-pay | Admitting: Emergency Medicine

## 2017-06-27 NOTE — Telephone Encounter (Signed)
Patient called crying, stating that her god son has passed last night and would like to know if she can get something for her nerves. Not sleeping,

## 2017-06-27 NOTE — Telephone Encounter (Signed)
appt please or MyChart or e-visit We are so sorry to hear this sad news

## 2017-06-27 NOTE — Telephone Encounter (Signed)
Per Dr.Lada, pt needs an appt for medication, please schedule thanks

## 2017-06-28 ENCOUNTER — Encounter: Payer: Self-pay | Admitting: Family Medicine

## 2017-06-28 ENCOUNTER — Ambulatory Visit (INDEPENDENT_AMBULATORY_CARE_PROVIDER_SITE_OTHER): Payer: Medicare HMO | Admitting: Family Medicine

## 2017-06-28 VITALS — BP 142/60 | HR 84 | Temp 98.3°F | Resp 14 | Wt 174.1 lb

## 2017-06-28 DIAGNOSIS — Z634 Disappearance and death of family member: Secondary | ICD-10-CM

## 2017-06-28 MED ORDER — ESCITALOPRAM OXALATE 10 MG PO TABS: 10.0000 mg | ORAL_TABLET | Freq: Every day | ORAL | 2 refills | Status: DC

## 2017-06-28 NOTE — Progress Notes (Signed)
BP (!) 142/60   Pulse 84   Temp 98.3 F (36.8 C) (Oral)   Resp 14   Wt 174 lb 1.6 oz (79 kg)   SpO2 94%   BMI 30.84 kg/m    Subjective:    Patient ID: Lori Potts, female    DOB: Dec 27, 1945, 71 y.o.   MRN: [id number]  HPI: Lori Potts is a 71 y.o. female  Chief Complaint  Patient presents with  . Anxiety    god son passed away    HPI Patient is here for an acute visit Her God son died at age 71, unexpectedly; his girlfriend found him dead in the bed She is upset about this The services are going to be held; having autopsy done right now Not sleeping well He was just like her own son They had some special time She talks to her pastor; she knows she can work through it She would be interested in trying something for this She took something when he father was killed in a car wreck; she found her mother dead; it's been a lot We reviewed med list She takes oxycodone, getting ready to have knee surgery  Depression screen [facility] 2[facility] 06/28/2017 06/12/2017 05/26/2017 04/13/2017 02/10/2017  Decreased Interest 0 0 0 0 0  Down, Depressed, Hopeless 1 0 0 0 0  PHQ - 2 Score 1 0 0 0 0    Relevant past medical, surgical, family and social history reviewed Past Medical History:  Diagnosis Date  . Arthritis   . 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 Cataract    right eye but immature  . Essential hypertension, benign    takes Lisinopril-HCTZ daily  . GERD (gastroesophageal reflux disease)   . Headache    sinus  . History of colon polyps    benign  . History of migraine   . History of shingles   . Hyperlipidemia    takes Lipitor daily  . Low back pain   . Nocturia   . S/P insertion of spinal cord stimulator   . Seasonal allergies    takes Singulair daily as needed  . Type II or unspecified type diabetes mellitus without mention of complication, not stated as uncontrolled   . Vitamin D deficiency    takes Vit D weekly  . Weakness    numbness and tingling left  arm  . Wears dentures    full upper and lower   Past Surgical History:  Procedure Laterality Date  . ABDOMINAL HYSTERECTOMY    . APPENDECTOMY    . AUGMENTATION MAMMAPLASTY Bilateral 1978  . BACK SURGERY    . BACK SURGERY     Lumbar fusion x 2  . BREAST EXCISIONAL BIOPSY Right 1970  . BREAST SURGERY  1990   Augementation  . CARDIAC CATHETERIZATION  5/12   ef 55%  . CARDIAC CATHETERIZATION  10/2010   [facility]; EF 55%  . COLONOSCOPY    . ESOPHAGOGASTRODUODENOSCOPY    . KIDNEY SURGERY  1998   growth removed from left kidney   . pain stimulator    . REVISION OF SCAR TISSUE RECTUS MUSCLE    . SMALL BOWEL REPAIR    . TOTAL KNEE ARTHROPLASTY Left    Family History  Problem Relation Age of Onset  . Heart attack Mother   . Sarcoidosis Sister    Social History   Socioeconomic History  . Marital status: Married    Spouse name: Not on file  . Number of  children: Not on file  . Years of education: Not on file  . Highest education level: Not on file  Social Needs  . Financial resource strain: Not on file  . Food insecurity - worry: Not on file  . Food insecurity - inability: Not on file  . Transportation needs - medical: Not on file  . Transportation needs - non-medical: Not on file  Occupational History  . Not on file  Tobacco Use  . Smoking status: Former Smoker    Types: Cigarettes    Last attempt to quit: 2006    Years since quitting: 12.8  . Smokeless tobacco: Never Used  . Tobacco comment: quit smoking 8175yrs ago  Substance and Sexual Activity  . Alcohol use: No  . Drug use: No  . Sexual activity: Yes    Birth control/protection: Surgical  Other Topics Concern  . Not on file  Social History Narrative   ** Merged History Encounter **        Interim medical history since last visit reviewed. Allergies and medications reviewed  Review of Systems Per HPI unless specifically indicated above     Objective:    BP (!) 142/60   Pulse 84   Temp 98.3 F (36.8 C)  (Oral)   Resp 14   Wt 174 lb 1.6 oz (79 kg)   SpO2 94%   BMI 30.84 kg/m   Wt Readings from Last 3 Encounters:  06/28/17 174 lb 1.6 oz (79 kg)  06/02/17 170 lb 4.8 oz (77.2 kg)  05/26/17 170 lb 11.2 oz (77.4 kg)    Physical Exam  Constitutional: She appears well-developed and well-nourished.  Cardiovascular: Normal rate.  Pulmonary/Chest: Effort normal.  Psychiatric: Her mood appears not anxious. Her affect is not blunt. Her speech is not delayed. She is not slowed and not withdrawn. She expresses no homicidal and no suicidal ideation.  Briefly tearful when talking about her god son's death; good eye contact with examiner; denies SI/HI      Assessment & Plan:   Problem List Items Addressed This Visit    None    Visit Diagnoses    Death of family member    -  Primary   supportive listening provided; I will not prescribe a benzo for her, as she already has an opioid on her med list; start SSRI; hospice grief couns suggested       Follow up plan: No Follow-up on file.  An after-visit summary was printed and given to the patient at check-out.  Please see the patient instructions which may contain other information and recommendations beyond what is mentioned above in the assessment and plan.  Meds ordered this encounter  Medications  . escitalopram (LEXAPRO) 10 MG tablet    Sig: Take 1 tablet (10 mg total) daily by mouth.    Dispense:  30 tablet    Refill:  2    No orders of the defined types were placed in this encounter.   Face-to-face time with patient was more than 15 minutes, >50% time spent counseling and coordination of care

## 2017-06-28 NOTE — Patient Instructions (Addendum)
Start the new medicine Just call me with an update in 2 weeks on how you're feeling and how the medicine is doing You can contact Hospice for free grief counseling

## 2017-06-28 NOTE — Telephone Encounter (Signed)
Pt schedule

## 2017-07-03 ENCOUNTER — Other Ambulatory Visit: Payer: Self-pay | Admitting: Family Medicine

## 2017-07-03 NOTE — H&P (Signed)
TOTAL KNEE ADMISSION H&P  Patient is being admitted for right total knee arthroplasty.  Subjective:  Chief Complaint:right knee pain.  HPI: Lori Potts, 71 y.o. female, has a history of pain and functional disability in the right knee due to arthritis and has failed non-surgical conservative treatments for greater than 12 weeks to includecorticosteriod injections, viscosupplementation injections, flexibility and strengthening excercises, weight reduction as appropriate and activity modification.  Onset of symptoms was gradual, starting 3 years ago with gradually worsening course since that time. The patient noted no past surgery on the right knee(s).  Patient currently rates pain in the right knee(s) at 7 out of 10 with activity. Patient has worsening of pain with activity and weight bearing, pain that interferes with activities of daily living, pain with passive range of motion and joint swelling.  Patient has evidence of subchondral sclerosis, periarticular osteophytes and joint space narrowing by imaging studies. There is no active infection.  Patient Active Problem List   Diagnosis Date Noted  . Allergic rhinitis 05/26/2017  . Vitamin D deficiency 05/19/2017  . Chronic nonintractable headache 05/18/2017  . Special screening for malignant neoplasms, colon   . Gastroesophageal reflux disease   . Gastritis without bleeding   . 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 Insomnia 11/29/2016  . 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 Vasomotor flushing 04/19/2016  . Abdominal wall pain in right flank 03/29/2016  . Diverticulosis 02/24/2016  . Hypercholesterolemia 06/16/2015  . FHx: migraine headaches 04/13/2015  . Chronic pain 04/13/2015  . Neck pain 02/26/2015  . Status post lumbar surgery 02/26/2015  . Abnormal ECG 01/27/2015  . Angina pectoris ([place]) 01/27/2015  . Chest pain 01/27/2015  . Diabetes mellitus type 2, uncontrolled, without  complications ([place]) 82/42/3536  . BP (high blood pressure) 01/27/2015   Past Medical History:  Diagnosis Date  . Arthritis   . 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 Cataract    right eye but immature  . Essential hypertension, benign    takes Lisinopril-HCTZ daily  . GERD (gastroesophageal reflux disease)   . Headache    sinus  . History of colon polyps    benign  . History of migraine   . History of shingles   . Hyperlipidemia    takes Lipitor daily  . Low back pain   . Nocturia   . S/P insertion of spinal cord stimulator   . Seasonal allergies    takes Singulair daily as needed  . Type II or unspecified type diabetes mellitus without mention of complication, not stated as uncontrolled   . Vitamin D deficiency    takes Vit D weekly  . Weakness    numbness and tingling left arm  . Wears dentures    full upper and lower    Past Surgical History:  Procedure Laterality Date  . ABDOMINAL HYSTERECTOMY    . APPENDECTOMY    . AUGMENTATION MAMMAPLASTY Bilateral 1978  . BACK SURGERY    . BACK SURGERY     Lumbar fusion x 2  . BREAST EXCISIONAL BIOPSY Right 1970  . BREAST SURGERY  1990   Augementation  . CARDIAC CATHETERIZATION  5/12   ef 55%  . CARDIAC CATHETERIZATION  10/2010   [facility]; EF 55%  . COLONOSCOPY    . ESOPHAGOGASTRODUODENOSCOPY    . [place]   growth removed from left kidney   . pain stimulator    . REVISION OF SCAR TISSUE RECTUS MUSCLE    .  SMALL BOWEL REPAIR    . TOTAL KNEE ARTHROPLASTY Left     No current facility-administered medications for this encounter.    Current Outpatient Medications  Medication Sig Dispense Refill Last Dose  . ACCU-CHEK FASTCLIX LANCETS MISC    Taking  . albuterol (PROVENTIL HFA;VENTOLIN HFA) 108 (90 Base) MCG/ACT inhaler Inhale 2 puffs into the lungs every 6 (six) hours as needed (for wheezing and shortness of breath from seasonal allergies). 1 Inhaler 1 Taking  . albuterol (PROVENTIL) (2.5 MG/3ML) 0.083%  nebulizer solution    Taking  . atorvastatin (LIPITOR) 40 MG tablet Take 1 tablet (40 mg total) by mouth at bedtime. 90 tablet 1 Taking  . Blood Glucose Calibration (ACCU-CHEK SMARTVIEW CONTROL) LIQD    Taking  . Blood Glucose Monitoring Suppl (ACCU-CHEK NANO SMARTVIEW) w/Device KIT CHECK BLOOD SUGAR TWICE DAILY 1 kit 0 Taking  . dexlansoprazole (DEXILANT) 60 MG capsule Take 1 capsule (60 mg total) by mouth daily. 30 capsule 6 Taking  . escitalopram (LEXAPRO) 10 MG tablet Take 1 tablet (10 mg total) daily by mouth. 30 tablet 2   . flunisolide (NASALIDE) 25 MCG/ACT (0.025%) SOLN Place 2 sprays into the nose 2 (two) times daily. 1 Bottle 0 Taking  . glucose blood (ACCU-CHEK SMARTVIEW) test strip TEST BLOOD SUGARS 4 TIMES DAILY 150 each PRN Taking  . Insulin Degludec-Liraglutide (XULTOPHY) 100-3.6 UNIT-MG/ML SOPN Inject 12 Units into the skin at bedtime.   Taking  . Insulin Pen Needle (BD ULTRA-FINE PEN NEEDLES) 29G X 12.7MM MISC 1 pen by Does not apply route once. 100 each 3 Taking  . ipratropium-albuterol (DUONEB) 0.5-2.5 (3) MG/3ML SOLN TAKE 3 MLS BY NEBULIZATION EVERY 4 (FOUR) HOURS AS NEEDED. 360 mL 3 Taking  . levocetirizine (XYZAL) 5 MG tablet Take 1 tablet (5 mg total) by mouth every evening. 30 tablet 1 Taking  . mometasone (NASONEX) 50 MCG/ACT nasal spray Place 2 sprays into the nose daily. 17 g 0 Taking  . montelukast (SINGULAIR) 10 MG tablet Take 1 tablet (10 mg total) by mouth at bedtime. 30 tablet 3 Taking  . oxyCODONE-acetaminophen (PERCOCET) 10-325 MG tablet Take 1 tablet by mouth every 4 (four) hours as needed for pain.   Taking  . Vitamin D, Cholecalciferol, 1000 units TABS Take 1,000 Units by mouth daily. 90 tablet 3 Taking   Allergies  Allergen Reactions  . Adhesive [Tape] Rash and Other (See Comments)    Regular tape is ok, allergy is to paper tape    Social History   Tobacco Use  . Smoking status: Former Smoker    Types: Cigarettes    Last attempt to quit: 2006    Years  since quitting: 12.8  . Smokeless tobacco: Never Used  . Tobacco comment: quit smoking 58yr ago  Substance Use Topics  . Alcohol use: No    Family History  Problem Relation Age of Onset  . Heart attack Mother   . Sarcoidosis Sister      Review of Systems  Constitutional: Negative.   HENT: Negative.   Eyes: Negative.   Respiratory: Negative.   Cardiovascular: Negative.   Gastrointestinal: Negative.   Genitourinary: Negative.   Musculoskeletal: Positive for joint pain.  Skin: Negative.   Neurological: Negative.   Endo/Heme/Allergies: Negative.   Psychiatric/Behavioral: Negative.     Objective:  Physical Exam  Constitutional: She is oriented to person, place, and time. She appears well-developed.  HENT:  Head: Normocephalic.  Eyes: EOM are normal.  Neck: Normal range of motion.  Cardiovascular: Normal rate and intact distal pulses.  Respiratory: Effort normal.  GI: Soft.  Genitourinary:  Genitourinary Comments: Deferred  Musculoskeletal:  Right knee pain. RLE grossly n/v intact. Knee is stable.  Neurological: She is alert and oriented to person, place, and time.  Skin: Skin is warm and dry.  Psychiatric: Her behavior is normal.    Vital signs in last 24 hours: BP: ()/()  Arterial Line BP: ()/()   Labs:   Estimated body mass index is 30.84 kg/m as calculated from the following:   Height as of 06/02/17: '5\' 3"'$  (1.6 m).   Weight as of 06/28/17: 79 kg (174 lb 1.6 oz).   Imaging Review Plain radiographs demonstrate moderate degenerative joint disease of the right knee(s). The overall alignment ismild varus. The bone quality appears to be good for age and reported activity level.  Assessment/Plan:  End stage arthritis, right knee   The patient history, physical examination, clinical judgment of the provider and imaging studies are consistent with end stage degenerative joint disease of the right knee(s) and total knee arthroplasty is deemed medically  necessary. The treatment options including medical management, injection therapy arthroscopy and arthroplasty were discussed at length. The risks and benefits of total knee arthroplasty were presented and reviewed. The risks due to aseptic loosening, infection, stiffness, patella tracking problems, thromboembolic complications and other imponderables were discussed. The patient acknowledged the explanation, agreed to proceed with the plan and consent was signed. Patient is being admitted for inpatient treatment for surgery, pain control, PT, OT, prophylactic antibiotics, VTE prophylaxis, progressive ambulation and ADL's and discharge planning. The patient is planning to be discharged home with home health services.  Will use IV tranexamic acid. Contraindications and adverse affects of Tranexamic acid discussed in detail. Patient denies any of these at this time and understands the risks and benefits.

## 2017-07-03 NOTE — Telephone Encounter (Signed)
Taken off med list at last appt; ineffective

## 2017-07-17 ENCOUNTER — Other Ambulatory Visit: Payer: Self-pay | Admitting: Family Medicine

## 2017-07-17 NOTE — Telephone Encounter (Signed)
patient said trazodone wasn't working Removed from med list earlier

## 2017-07-20 ENCOUNTER — Other Ambulatory Visit: Payer: Self-pay

## 2017-07-20 DIAGNOSIS — J0101 Acute recurrent maxillary sinusitis: Secondary | ICD-10-CM

## 2017-07-20 MED ORDER — LEVOCETIRIZINE DIHYDROCHLORIDE 5 MG PO TABS: 5.0000 mg | ORAL_TABLET | Freq: Every day | ORAL | 3 refills | Status: DC | PRN

## 2017-07-20 NOTE — Telephone Encounter (Signed)
Refill request for general medication: Levocetirzine 5 mg   Last office visit: 06/28/2017  Last physical exam: None indicated  Follow up visit: None indicated

## 2017-07-21 DIAGNOSIS — G894 Chronic pain syndrome: Secondary | ICD-10-CM | POA: Diagnosis not present

## 2017-07-21 NOTE — Progress Notes (Signed)
05-26-17 (Epic) CXR   02-10-17 (Epic) CT Abd Pelvis w/Contrast  02-04-16  ECHO on chart  02-02-16  Stress on chart

## 2017-07-21 NOTE — Patient Instructions (Addendum)
[name]  07/21/2017   Your procedure is scheduled on: 07-28-17  [facility]  Entrance [place] to 3rd floor to  Short Stay Center at 1:15 PM.   Call this number if you have problems the morning of surgery [phone]   Remember: ONLY 1 PERSON MAY GO WITH YOU TO SHORT STAY TO GET  READY MORNING OF YOUR SURGERY.  Do not eat food or drink liquids :After Midnight. You may have a Clear Liquid Diet from Midnight until 9:45 AM. After 9:45 AM, nothing until after surgery.     CLEAR LIQUID DIET   Foods Allowed                                                                     Foods Excluded  Coffee and tea, regular and decaf                             liquids that you cannot  Plain Jell-O in any flavor                                             see through such as: Fruit ices (not with fruit pulp)                                     milk, soups, orange juice  Iced Popsicles                                    All solid food Carbonated beverages, regular and diet                                    Cranberry, grape and apple juices Sports drinks like Gatorade Lightly seasoned clear broth or consume(fat free) Sugar, honey syrup  Sample Menu Breakfast                                Lunch                                     Supper Cranberry juice                    Beef broth                            Chicken broth Jell-O                                     Grape juice  Apple juice Coffee or tea                        Jell-O                                      Popsicle                                                Coffee or tea                        Coffee or tea  _____________________________________________________________________     Take these medicines the morning of surgery with A SIP OF WATER: Omeprazole (Prilosec). You may also bring and use your inhaler as needed. DO NOT TAKE ANY DIABETIC  MEDICATIONS DAY OF YOUR SURGERY                               You may not have any metal on your body including hair pins and              piercings  Do not wear jewelry, make-up, lotions, powders or perfumes, deodorant             Do not wear nail polish.  Do not shave  48 hours prior to surgery.                Do not bring valuables to the hospital.  IS NOT             RESPONSIBLE   FOR VALUABLES.  Contacts, dentures or bridgework may not be worn into surgery.  Leave suitcase in the car. After surgery it may be brought to your room.                Please read over the following fact sheets you were given: _____________________________________________________________________  How to Manage Your Diabetes Before and After Surgery  Why is it important to control my blood sugar before and after surgery? . Improving blood sugar levels before and after surgery helps healing and can limit problems. . A way of improving blood sugar control is eating a healthy diet by: o  Eating less sugar and carbohydrates o  Increasing activity/exercise o  Talking with your doctor about reaching your blood sugar goals . High blood sugars (greater than 180 mg/dL) can raise your risk of infections and slow your recovery, so you will need to focus on controlling your diabetes during the weeks before surgery. . Make sure that the doctor who takes care of your diabetes knows about your planned surgery including the date and location.  How do I manage my blood sugar before surgery? . Check your blood sugar at least 4 times a day, starting 2 days before surgery, to make sure that the level is not too high or low. o Check your blood sugar the morning of your surgery when you wake up and every 2 hours until you get to the Short Stay unit. . If your blood sugar is less than 70 mg/dL, you will need to treat for low blood sugar: o Do not take insulin. o Treat a low blood sugar (less  than 70 mg/dL) with  cup  of clear juice (cranberry or apple), 4 glucose tablets, OR glucose gel. o Recheck blood sugar in 15 minutes after treatment (to make sure it is greater than 70 mg/dL). If your blood sugar is not greater than 70 mg/dL on recheck, call [phone] for further instructions. . Report your blood sugar to the short stay nurse when you get to Short Stay.  . If you are admitted to the hospital after surgery: o Your blood sugar will be checked by the staff and you will probably be given insulin after surgery (instead of oral diabetes medicines) to make sure you have good blood sugar levels. o The goal for blood sugar control after surgery is 80-180 mg/dL.   WHAT DO I DO ABOUT MY DIABETES MEDICATION?  Marland Kitchen Do not take oral diabetes medicines (pills) the morning of surgery.  . THE DAY BEFORE SURGERY, take 6 U of Xultophy (Pt preferance).        Patient Signature:  Date:   Nurse Signature:  Date:   Reviewed and Endorsed by [facility] Patient Education Committee, August 2015           [facility] - Preparing for Surgery Before surgery, you can play an important role.  Because skin is not sterile, your skin needs to be as free of germs as possible.  You can reduce the number of germs on your skin by washing with CHG (chlorahexidine gluconate) soap before surgery.  CHG is an antiseptic cleaner which kills germs and bonds with the skin to continue killing germs even after washing. Please DO NOT use if you have an allergy to CHG or antibacterial soaps.  If your skin becomes reddened/irritated stop using the CHG and inform your nurse when you arrive at Short Stay. Do not shave (including legs and underarms) for at least 48 hours prior to the first CHG shower.  You may shave your face/neck. Please follow these instructions carefully:  1.  Shower with CHG Soap the night before surgery and the  morning of Surgery.  2.  If you choose to wash your hair, wash your hair first as usual with your  normal   shampoo.  3.  After you shampoo, rinse your hair and body thoroughly to remove the  shampoo.                           4.  Use CHG as you would any other liquid soap.  You can apply chg directly  to the skin and wash                       Gently with a scrungie or clean washcloth.  5.  Apply the CHG Soap to your body ONLY FROM THE NECK DOWN.   Do not use on face/ open                           Wound or open sores. Avoid contact with eyes, ears mouth and genitals (private parts).                       Wash face,  Genitals (private parts) with your normal soap.             6.  Wash thoroughly, paying special attention to the area where your surgery  will be performed.  7.  Thoroughly  rinse your body with warm water from the neck down.  8.  DO NOT shower/wash with your normal soap after using and rinsing off  the CHG Soap.                9.  Pat yourself dry with a clean towel.            10.  Wear clean pajamas.            11.  Place clean sheets on your bed the night of your first shower and do not  sleep with pets. Day of Surgery : Do not apply any lotions/deodorants the morning of surgery.  Please wear clean clothes to the hospital/surgery center.  FAILURE TO FOLLOW THESE INSTRUCTIONS MAY RESULT IN THE CANCELLATION OF YOUR SURGERY PATIENT SIGNATURE_________________________________  NURSE SIGNATURE__________________________________  ________________________________________________________________________   Adam Phenix  An incentive spirometer is a tool that can help keep your lungs clear and active. This tool measures how well you are filling your lungs with each breath. Taking long deep breaths may help reverse or decrease the chance of developing breathing (pulmonary) problems (especially infection) following:  A long period of time when you are unable to move or be active. BEFORE THE PROCEDURE   If the spirometer includes an indicator to show your best effort, your nurse or  respiratory therapist will set it to a desired goal.  If possible, sit up straight or lean slightly forward. Try not to slouch.  Hold the incentive spirometer in an upright position. INSTRUCTIONS FOR USE  1. Sit on the edge of your bed if possible, or sit up as far as you can in bed or on a chair. 2. Hold the incentive spirometer in an upright position. 3. Breathe out normally. 4. Place the mouthpiece in your mouth and seal your lips tightly around it. 5. Breathe in slowly and as deeply as possible, raising the piston or the ball toward the top of the column. 6. Hold your breath for 3-5 seconds or for as long as possible. Allow the piston or ball to fall to the bottom of the column. 7. Remove the mouthpiece from your mouth and breathe out normally. 8. Rest for a few seconds and repeat Steps 1 through 7 at least 10 times every 1-2 hours when you are awake. Take your time and take a few normal breaths between deep breaths. 9. The spirometer may include an indicator to show your best effort. Use the indicator as a goal to work toward during each repetition. 10. After each set of 10 deep breaths, practice coughing to be sure your lungs are clear. If you have an incision (the cut made at the time of surgery), support your incision when coughing by placing a pillow or rolled up towels firmly against it. Once you are able to get out of bed, walk around indoors and cough well. You may stop using the incentive spirometer when instructed by your caregiver.  RISKS AND COMPLICATIONS  Take your time so you do not get dizzy or light-headed.  If you are in pain, you may need to take or ask for pain medication before doing incentive spirometry. It is harder to take a deep breath if you are having pain. AFTER USE  Rest and breathe slowly and easily.  It can be helpful to keep track of a log of your progress. Your caregiver can provide you with a simple table to help with this. If you are using the  spirometer  at home, follow these instructions: [place] IF:   You are having difficultly using the spirometer.  You have trouble using the spirometer as often as instructed.  Your pain medication is not giving enough relief while using the spirometer.  You develop fever of 100.5 F (38.1 C) or higher. SEEK IMMEDIATE MEDICAL CARE IF:   You cough up bloody sputum that had not been present before.  You develop fever of 102 F (38.9 C) or greater.  You develop worsening pain at or near the incision site. MAKE SURE YOU:   Understand these instructions.  Will watch your condition.  Will get help right away if you are not doing well or get worse. Document Released: 12/19/2006 Document Revised: 10/31/2011 Document Reviewed: 02/19/2007 ExitCare Patient Information 2014 Exit[employer], [place].   ________________________________________________________________________  WHAT IS A BLOOD TRANSFUSION? Blood Transfusion Information  A transfusion is the replacement of blood or some of its parts. Blood is made up of multiple cells which provide different functions.  Red blood cells carry oxygen and are used for blood loss replacement.  White blood cells fight against infection.  Platelets control bleeding.  Plasma helps clot blood.  Other blood products are available for specialized needs, such as hemophilia or other clotting disorders. BEFORE THE TRANSFUSION  Who gives blood for transfusions?   Healthy volunteers who are fully evaluated to make sure their blood is safe. This is blood bank blood. Transfusion therapy is the safest it has ever been in the practice of medicine. Before blood is taken from a donor, a complete history is taken to make sure that person has no history of diseases nor engages in risky social behavior (examples are intravenous drug use or sexual activity with multiple partners). The donor's travel history is screened to minimize risk of transmitting  infections, such as malaria. The donated blood is tested for signs of infectious diseases, such as HIV and hepatitis. The blood is then tested to be sure it is compatible with you in order to minimize the chance of a transfusion reaction. If you or a relative donates blood, this is often done in anticipation of surgery and is not appropriate for emergency situations. It takes many days to process the donated blood. RISKS AND COMPLICATIONS Although transfusion therapy is very safe and saves many lives, the main dangers of transfusion include:   Getting an infectious disease.  Developing a transfusion reaction. This is an allergic reaction to something in the blood you were given. Every precaution is taken to prevent this. The decision to have a blood transfusion has been considered carefully by your caregiver before blood is given. Blood is not given unless the benefits outweigh the risks. AFTER THE TRANSFUSION  Right after receiving a blood transfusion, you will usually feel much better and more energetic. This is especially true if your red blood cells have gotten low (anemic). The transfusion raises the level of the red blood cells which carry oxygen, and this usually causes an energy increase.  The nurse administering the transfusion will monitor you carefully for complications. HOME CARE INSTRUCTIONS  No special instructions are needed after a transfusion. You may find your energy is better. Speak with your caregiver about any limitations on activity for underlying diseases you may have. SEEK MEDICAL CARE IF:   Your condition is not improving after your transfusion.  You develop redness or irritation at the intravenous (IV) site. SEEK IMMEDIATE MEDICAL CARE IF:  Any of the following symptoms occur over the next  12 hours:  Shaking chills.  You have a temperature by mouth above 102 F (38.9 C), not controlled by medicine.  Chest, back, or muscle pain.  People around you feel you are  not acting correctly or are confused.  Shortness of breath or difficulty breathing.  Dizziness and fainting.  You get a rash or develop hives.  You have a decrease in urine output.  Your urine turns a dark color or changes to pink, red, or brown. Any of the following symptoms occur over the next 10 days:  You have a temperature by mouth above 102 F (38.9 C), not controlled by medicine.  Shortness of breath.  Weakness after normal activity.  The white part of the eye turns yellow (jaundice).  You have a decrease in the amount of urine or are urinating less often.  Your urine turns a dark color or changes to pink, red, or brown. Document Released: 08/05/2000 Document Revised: 10/31/2011 Document Reviewed: 03/24/2008 [facility] Patient Information 2014 [place], [place].  _______________________________________________________________________

## 2017-07-24 ENCOUNTER — Other Ambulatory Visit (HOSPITAL_COMMUNITY)
Admit: 2017-07-24 | Discharge: 2017-07-24 | Disposition: A | Discharge status: Home / Self Care | Payer: Medicare HMO | Attending: Specialist | Admitting: Specialist

## 2017-07-24 ENCOUNTER — Other Ambulatory Visit: Payer: Self-pay

## 2017-07-24 ENCOUNTER — Encounter (HOSPITAL_COMMUNITY)
Admission: RE | Admit: 2017-07-24 | Discharge: 2017-07-24 | Disposition: A | Discharge status: Home / Self Care | Payer: Medicare HMO | Source: Ambulatory Visit | Attending: Specialist | Admitting: Specialist

## 2017-07-24 ENCOUNTER — Encounter (HOSPITAL_COMMUNITY): Payer: Self-pay

## 2017-07-24 DIAGNOSIS — E7800 Pure hypercholesterolemia, unspecified: Secondary | ICD-10-CM | POA: Diagnosis not present

## 2017-07-24 DIAGNOSIS — Z79899 Other long term (current) drug therapy: Secondary | ICD-10-CM | POA: Insufficient documentation

## 2017-07-24 DIAGNOSIS — Z01818 Encounter for other preprocedural examination: Secondary | ICD-10-CM | POA: Diagnosis not present

## 2017-07-24 DIAGNOSIS — M1711 Unilateral primary osteoarthritis, right knee: Secondary | ICD-10-CM | POA: Insufficient documentation

## 2017-07-24 DIAGNOSIS — I10 Essential (primary) hypertension: Secondary | ICD-10-CM | POA: Diagnosis not present

## 2017-07-24 DIAGNOSIS — Z794 Long term (current) use of insulin: Secondary | ICD-10-CM | POA: Insufficient documentation

## 2017-07-24 DIAGNOSIS — E1165 Type 2 diabetes mellitus with hyperglycemia: Secondary | ICD-10-CM | POA: Diagnosis not present

## 2017-07-24 DIAGNOSIS — E119 Type 2 diabetes mellitus without complications: Secondary | ICD-10-CM | POA: Insufficient documentation

## 2017-07-24 DIAGNOSIS — K219 Gastro-esophageal reflux disease without esophagitis: Secondary | ICD-10-CM | POA: Diagnosis not present

## 2017-07-24 DIAGNOSIS — Z7982 Long term (current) use of aspirin: Secondary | ICD-10-CM | POA: Insufficient documentation

## 2017-07-24 LAB — URINALYSIS, ROUTINE W REFLEX MICROSCOPIC
Bilirubin Urine: NEGATIVE
Glucose, UA: NEGATIVE mg/dL
Hgb urine dipstick: NEGATIVE
Ketones, ur: NEGATIVE mg/dL
Leukocytes, UA: NEGATIVE
Nitrite: NEGATIVE
Protein, ur: NEGATIVE mg/dL
Specific Gravity, Urine: 1.011 (ref 1.005–1.030)
pH: 7 (ref 5.0–8.0)

## 2017-07-24 LAB — BASIC METABOLIC PANEL
Anion gap: 7 (ref 5–15)
BUN: 16 mg/dL (ref 6–20)
CO2: 27 mmol/L (ref 22–32)
Calcium: 9.6 mg/dL (ref 8.9–10.3)
Chloride: 106 mmol/L (ref 101–111)
Creatinine, Ser: 0.58 mg/dL (ref 0.44–1.00)
GFR calc Af Amer: >60 mL/min (ref >60)
GFR calc non Af Amer: >60 mL/min (ref >60)
Glucose, Bld: 83 mg/dL (ref 65–99)
Potassium: 4.2 mmol/L (ref 3.5–5.1)
Sodium: 140 mmol/L (ref 135–145)

## 2017-07-24 LAB — APTT: aPTT: 35 seconds (ref 24–36)

## 2017-07-24 LAB — CBC
HCT: 37.6 % (ref 36.0–46.0)
Hemoglobin: 12.5 g/dL (ref 12.0–15.0)
MCH: 26 pg (ref 26.0–34.0)
MCHC: 33.2 g/dL (ref 30.0–36.0)
MCV: 78.3 fL (ref 78.0–100.0)
Platelets: 342 10*3/uL (ref 150–400)
RBC: 4.8 MIL/uL (ref 3.87–5.11)
RDW: 14.1 % (ref 11.5–15.5)
WBC: 3.9 10*3/uL — ABNORMAL LOW (ref 4.0–10.5)

## 2017-07-24 LAB — SURGICAL PCR SCREEN
MRSA, PCR: NEGATIVE
Staphylococcus aureus: NEGATIVE

## 2017-07-24 LAB — GLUCOSE, CAPILLARY: Glucose-Capillary: 79 mg/dL (ref 65–99)

## 2017-07-24 LAB — PROTIME-INR
INR: 0.94
Prothrombin Time: 12.5 seconds (ref 11.4–15.2)

## 2017-07-25 ENCOUNTER — Telehealth: Payer: Self-pay

## 2017-07-25 LAB — HEMOGLOBIN A1C
Hgb A1c MFr Bld: 5.9 % — ABNORMAL HIGH (ref 4.8–5.6)
Mean Plasma Glucose: 123 mg/dL

## 2017-07-25 NOTE — Telephone Encounter (Signed)
Patient told me trazodone wasn't helpful so it was deleted from the med list Patient's med list has a lot of "yellow"; please review her medicines with her, find if we need to delete her lexapro, dexilant, nasalide, nasonex (she shouldn't be on two nasal corticosteroids anyway) Thank you

## 2017-07-25 NOTE — Telephone Encounter (Signed)
Received rx request from [facility] [place] for Trazodone 50mg  #90 Last fill 04/06/17. Please advise

## 2017-07-27 ENCOUNTER — Other Ambulatory Visit: Payer: Self-pay | Admitting: *Deleted

## 2017-07-27 NOTE — Patient Outreach (Signed)
[facility] Care Management  07/27/2017  Lori ChattersMary H Potts Feb 18, 1946 [id number]  RN Health Coach attempted #1 follow up outreach call to patient.  Patient was unavailable. No voicemail pickup.   Plan: RN will make inpatient hospital liaison aware of patient surgery scheduled for tomorrow.  [name] BSN RN [facility] Care [facility] [phone]

## 2017-07-28 ENCOUNTER — Encounter (HOSPITAL_COMMUNITY): Payer: Self-pay | Admitting: *Deleted

## 2017-07-28 ENCOUNTER — Encounter (HOSPITAL_COMMUNITY)
Admission: RE | Disposition: A | Discharge status: Home Health | Payer: Self-pay | Source: Ambulatory Visit | Attending: Specialist

## 2017-07-28 ENCOUNTER — Inpatient Hospital Stay (HOSPITAL_COMMUNITY): Payer: Medicare Other | Admitting: Certified Registered"

## 2017-07-28 ENCOUNTER — Inpatient Hospital Stay (HOSPITAL_COMMUNITY)
Admission: RE | Admit: 2017-07-28 | Discharge: 2017-07-29 | DRG: 470 | Disposition: A | Discharge status: Home Health | Payer: Medicare Other | Source: Ambulatory Visit | Attending: Specialist | Admitting: Specialist

## 2017-07-28 ENCOUNTER — Other Ambulatory Visit: Payer: Self-pay

## 2017-07-28 DIAGNOSIS — M1711 Unilateral primary osteoarthritis, right knee: Secondary | ICD-10-CM | POA: Diagnosis not present

## 2017-07-28 DIAGNOSIS — Z794 Long term (current) use of insulin: Secondary | ICD-10-CM | POA: Diagnosis not present

## 2017-07-28 DIAGNOSIS — Z96651 Presence of right artificial knee joint: Secondary | ICD-10-CM

## 2017-07-28 DIAGNOSIS — E119 Type 2 diabetes mellitus without complications: Secondary | ICD-10-CM | POA: Diagnosis not present

## 2017-07-28 DIAGNOSIS — Z7951 Long term (current) use of inhaled steroids: Secondary | ICD-10-CM

## 2017-07-28 DIAGNOSIS — I10 Essential (primary) hypertension: Secondary | ICD-10-CM | POA: Diagnosis not present

## 2017-07-28 DIAGNOSIS — Z96652 Presence of left artificial knee joint: Secondary | ICD-10-CM | POA: Diagnosis not present

## 2017-07-28 DIAGNOSIS — G8918 Other acute postprocedural pain: Secondary | ICD-10-CM | POA: Diagnosis not present

## 2017-07-28 DIAGNOSIS — E559 Vitamin D deficiency, unspecified: Secondary | ICD-10-CM | POA: Diagnosis present

## 2017-07-28 DIAGNOSIS — Z96659 Presence of unspecified artificial knee joint: Secondary | ICD-10-CM

## 2017-07-28 DIAGNOSIS — E7800 Pure hypercholesterolemia, unspecified: Secondary | ICD-10-CM | POA: Diagnosis not present

## 2017-07-28 DIAGNOSIS — J302 Other seasonal allergic rhinitis: Secondary | ICD-10-CM | POA: Diagnosis not present

## 2017-07-28 DIAGNOSIS — Z79899 Other long term (current) drug therapy: Secondary | ICD-10-CM

## 2017-07-28 DIAGNOSIS — Z7982 Long term (current) use of aspirin: Secondary | ICD-10-CM | POA: Diagnosis not present

## 2017-07-28 DIAGNOSIS — Z87891 Personal history of nicotine dependence: Secondary | ICD-10-CM

## 2017-07-28 DIAGNOSIS — K219 Gastro-esophageal reflux disease without esophagitis: Secondary | ICD-10-CM | POA: Diagnosis not present

## 2017-07-28 HISTORY — PX: TOTAL KNEE ARTHROPLASTY: SHX125

## 2017-07-28 LAB — GLUCOSE, CAPILLARY
Glucose-Capillary: 104 mg/dL — ABNORMAL HIGH (ref 65–99)
Glucose-Capillary: 110 mg/dL — ABNORMAL HIGH (ref 65–99)
Glucose-Capillary: 110 mg/dL — ABNORMAL HIGH (ref 65–99)
Glucose-Capillary: 54 mg/dL — ABNORMAL LOW (ref 65–99)
Glucose-Capillary: 74 mg/dL (ref 65–99)

## 2017-07-28 LAB — TYPE AND SCREEN
ABO/RH(D): O POS
Antibody Screen: NEGATIVE

## 2017-07-28 SURGERY — ARTHROPLASTY, KNEE, TOTAL
Anesthesia: General | Site: Knee | Laterality: Right

## 2017-07-28 MED ORDER — CEFAZOLIN SODIUM-DEXTROSE 2-4 GM/100ML-% IV SOLN
2.0000 g | INTRAVENOUS | Status: AC
  Administered 2017-07-28: 2 g via INTRAVENOUS
  Filled 2017-07-28: qty 100

## 2017-07-28 MED ORDER — MEPERIDINE HCL 50 MG/ML IJ SOLN: 6.2500 mg | INTRAMUSCULAR | Status: DC | PRN

## 2017-07-28 MED ORDER — KETOROLAC TROMETHAMINE 30 MG/ML IJ SOLN
INTRAMUSCULAR | Status: DC | PRN
  Administered 2017-07-28: 30 mg

## 2017-07-28 MED ORDER — LIDOCAINE 2% (20 MG/ML) 5 ML SYRINGE
INTRAMUSCULAR | Status: DC | PRN
  Administered 2017-07-28: 100 mg via INTRAVENOUS

## 2017-07-28 MED ORDER — FLUTICASONE PROPIONATE 50 MCG/ACT NA SUSP: 2.0000 | Freq: Every day | NASAL | Status: DC

## 2017-07-28 MED ORDER — METOCLOPRAMIDE HCL 5 MG PO TABS: 5.0000 mg | ORAL_TABLET | Freq: Three times a day (TID) | ORAL | Status: DC | PRN

## 2017-07-28 MED ORDER — ATORVASTATIN CALCIUM 40 MG PO TABS
40.0000 mg | ORAL_TABLET | Freq: Every day | ORAL | Status: DC
  Administered 2017-07-28: 22:00:00 40 mg via ORAL
  Filled 2017-07-28: qty 1

## 2017-07-28 MED ORDER — OXYCODONE HCL 5 MG PO TABS: 5.0000 mg | ORAL_TABLET | ORAL | 0 refills | Status: DC | PRN

## 2017-07-28 MED ORDER — MONTELUKAST SODIUM 10 MG PO TABS: 10.0000 mg | ORAL_TABLET | Freq: Every day | ORAL | Status: DC

## 2017-07-28 MED ORDER — SODIUM CHLORIDE 0.9 % IV SOLN
INTRAVENOUS | Status: DC
  Administered 2017-07-28: 22:00:00 via INTRAVENOUS

## 2017-07-28 MED ORDER — MENTHOL 3 MG MT LOZG: 1.0000 | LOZENGE | OROMUCOSAL | Status: DC | PRN

## 2017-07-28 MED ORDER — ALUM & MAG HYDROXIDE-SIMETH 200-200-20 MG/5ML PO SUSP: 30.0000 mL | ORAL | Status: DC | PRN

## 2017-07-28 MED ORDER — FENTANYL CITRATE (PF) 100 MCG/2ML IJ SOLN
25.0000 ug | INTRAMUSCULAR | Status: DC | PRN
  Administered 2017-07-28 (×3): 50 ug via INTRAVENOUS

## 2017-07-28 MED ORDER — FENTANYL CITRATE (PF) 100 MCG/2ML IJ SOLN
INTRAMUSCULAR | Status: AC
Start: 2017-07-28 — End: 2017-07-28
  Administered 2017-07-28: 50 ug via INTRAVENOUS
  Filled 2017-07-28: qty 4

## 2017-07-28 MED ORDER — CEFAZOLIN SODIUM-DEXTROSE 2-4 GM/100ML-% IV SOLN
2.0000 g | Freq: Four times a day (QID) | INTRAVENOUS | Status: AC
  Administered 2017-07-28 – 2017-07-29 (×2): 2 g via INTRAVENOUS
  Filled 2017-07-28 (×2): qty 100

## 2017-07-28 MED ORDER — SODIUM CHLORIDE 0.9 % IJ SOLN
INTRAMUSCULAR | Status: DC | PRN
  Administered 2017-07-28: 30 mL

## 2017-07-28 MED ORDER — LACTATED RINGERS IV SOLN
INTRAVENOUS | Status: DC | PRN
  Administered 2017-07-28 (×2): via INTRAVENOUS

## 2017-07-28 MED ORDER — ACETAMINOPHEN 650 MG RE SUPP: 650.0000 mg | RECTAL | Status: DC | PRN

## 2017-07-28 MED ORDER — LISINOPRIL 20 MG PO TABS
20.0000 mg | ORAL_TABLET | Freq: Every day | ORAL | Status: DC
  Administered 2017-07-28 – 2017-07-29 (×2): 20 mg via ORAL
  Filled 2017-07-28 (×2): qty 1

## 2017-07-28 MED ORDER — PHENOL 1.4 % MT LIQD: 1.0000 | OROMUCOSAL | Status: DC | PRN

## 2017-07-28 MED ORDER — ACETAMINOPHEN 325 MG PO TABS: 650.0000 mg | ORAL_TABLET | ORAL | Status: DC | PRN

## 2017-07-28 MED ORDER — OXYCODONE HCL 5 MG PO TABS
10.0000 mg | ORAL_TABLET | ORAL | Status: DC | PRN
  Administered 2017-07-29 (×4): 10 mg via ORAL
  Filled 2017-07-28 (×5): qty 2

## 2017-07-28 MED ORDER — KETOROLAC TROMETHAMINE 30 MG/ML IJ SOLN
INTRAMUSCULAR | Status: AC
  Filled 2017-07-28: qty 1

## 2017-07-28 MED ORDER — SUGAMMADEX SODIUM 200 MG/2ML IV SOLN
INTRAVENOUS | Status: DC | PRN
  Administered 2017-07-28: 175 mg via INTRAVENOUS

## 2017-07-28 MED ORDER — METOCLOPRAMIDE HCL 5 MG/ML IJ SOLN: 5.0000 mg | Freq: Three times a day (TID) | INTRAMUSCULAR | Status: DC | PRN

## 2017-07-28 MED ORDER — DEXTROSE 5 % IV SOLN
500.0000 mg | Freq: Four times a day (QID) | INTRAVENOUS | Status: DC | PRN
  Administered 2017-07-28: 500 mg via INTRAVENOUS
  Filled 2017-07-28: qty 550

## 2017-07-28 MED ORDER — METHOCARBAMOL 500 MG PO TABS
500.0000 mg | ORAL_TABLET | Freq: Four times a day (QID) | ORAL | Status: DC | PRN
  Administered 2017-07-29 (×2): 500 mg via ORAL
  Filled 2017-07-28 (×2): qty 1

## 2017-07-28 MED ORDER — TRANEXAMIC ACID 1000 MG/10ML IV SOLN
1000.0000 mg | INTRAVENOUS | Status: AC
  Administered 2017-07-28: 1000 mg via INTRAVENOUS
  Filled 2017-07-28: qty 1100

## 2017-07-28 MED ORDER — FENTANYL CITRATE (PF) 100 MCG/2ML IJ SOLN
INTRAMUSCULAR | Status: DC | PRN
  Administered 2017-07-28 (×4): 50 ug via INTRAVENOUS

## 2017-07-28 MED ORDER — LACTATED RINGERS IV SOLN
INTRAVENOUS | Status: DC
  Administered 2017-07-28: 14:00:00 via INTRAVENOUS

## 2017-07-28 MED ORDER — MIDAZOLAM HCL 2 MG/2ML IJ SOLN
1.0000 mg | INTRAMUSCULAR | Status: DC
  Administered 2017-07-28 (×2): 1 mg via INTRAVENOUS

## 2017-07-28 MED ORDER — LEVOCETIRIZINE DIHYDROCHLORIDE 5 MG PO TABS: 5.0000 mg | ORAL_TABLET | Freq: Every day | ORAL | Status: DC | PRN

## 2017-07-28 MED ORDER — FENTANYL CITRATE (PF) 100 MCG/2ML IJ SOLN
50.0000 ug | INTRAMUSCULAR | Status: DC
  Administered 2017-07-28: 100 ug via INTRAVENOUS

## 2017-07-28 MED ORDER — DEXTROSE 50 % IV SOLN
25.0000 mL | Freq: Once | INTRAVENOUS | Status: AC
  Administered 2017-07-28: 25 mL via INTRAVENOUS

## 2017-07-28 MED ORDER — INSULIN ASPART 100 UNIT/ML ~~LOC~~ SOLN
0.0000 [IU] | Freq: Three times a day (TID) | SUBCUTANEOUS | Status: DC
  Administered 2017-07-29: 13:00:00 3 [IU] via SUBCUTANEOUS
  Administered 2017-07-29: 2 [IU] via SUBCUTANEOUS

## 2017-07-28 MED ORDER — ONDANSETRON HCL 4 MG/2ML IJ SOLN
INTRAMUSCULAR | Status: DC | PRN
  Administered 2017-07-28: 4 mg via INTRAVENOUS

## 2017-07-28 MED ORDER — HYDROMORPHONE HCL 1 MG/ML IJ SOLN
1.0000 mg | INTRAMUSCULAR | Status: DC | PRN
  Administered 2017-07-28 – 2017-07-29 (×5): 1 mg via INTRAVENOUS
  Filled 2017-07-28 (×5): qty 1

## 2017-07-28 MED ORDER — FENTANYL CITRATE (PF) 100 MCG/2ML IJ SOLN
INTRAMUSCULAR | Status: AC
  Filled 2017-07-28: qty 2

## 2017-07-28 MED ORDER — MIDAZOLAM HCL 2 MG/2ML IJ SOLN
INTRAMUSCULAR | Status: AC
  Administered 2017-07-28: 1 mg via INTRAVENOUS
  Filled 2017-07-28: qty 2

## 2017-07-28 MED ORDER — DOCUSATE SODIUM 100 MG PO CAPS
100.0000 mg | ORAL_CAPSULE | Freq: Two times a day (BID) | ORAL | Status: DC
  Administered 2017-07-28 – 2017-07-29 (×2): 100 mg via ORAL
  Filled 2017-07-28 (×2): qty 1

## 2017-07-28 MED ORDER — SODIUM CHLORIDE 0.9 % IJ SOLN
INTRAMUSCULAR | Status: AC
  Filled 2017-07-28: qty 50

## 2017-07-28 MED ORDER — ROCURONIUM BROMIDE 50 MG/5ML IV SOSY
PREFILLED_SYRINGE | INTRAVENOUS | Status: AC
  Filled 2017-07-28: qty 5

## 2017-07-28 MED ORDER — POLYETHYLENE GLYCOL 3350 17 G PO PACK: 17.0000 g | PACK | Freq: Every day | ORAL | Status: DC | PRN

## 2017-07-28 MED ORDER — ENOXAPARIN SODIUM 30 MG/0.3ML ~~LOC~~ SOLN
30.0000 mg | Freq: Two times a day (BID) | SUBCUTANEOUS | Status: DC
  Administered 2017-07-29: 08:00:00 30 mg via SUBCUTANEOUS
  Filled 2017-07-28: qty 0.3

## 2017-07-28 MED ORDER — LIDOCAINE 2% (20 MG/ML) 5 ML SYRINGE
INTRAMUSCULAR | Status: AC
  Filled 2017-07-28: qty 5

## 2017-07-28 MED ORDER — ONDANSETRON HCL 4 MG PO TABS: 4.0000 mg | ORAL_TABLET | Freq: Four times a day (QID) | ORAL | Status: DC | PRN

## 2017-07-28 MED ORDER — HYDROMORPHONE HCL 1 MG/ML IJ SOLN
0.2500 mg | INTRAMUSCULAR | Status: DC | PRN
  Administered 2017-07-28: 0.5 mg via INTRAVENOUS

## 2017-07-28 MED ORDER — FERROUS SULFATE 325 (65 FE) MG PO TABS
325.0000 mg | ORAL_TABLET | Freq: Three times a day (TID) | ORAL | Status: DC
  Administered 2017-07-29: 13:00:00 325 mg via ORAL
  Filled 2017-07-28 (×3): qty 1

## 2017-07-28 MED ORDER — PROPOFOL 10 MG/ML IV BOLUS
INTRAVENOUS | Status: DC | PRN
  Administered 2017-07-28: 160 mg via INTRAVENOUS

## 2017-07-28 MED ORDER — ALBUTEROL SULFATE HFA 108 (90 BASE) MCG/ACT IN AERS
2.0000 | INHALATION_SPRAY | Freq: Four times a day (QID) | RESPIRATORY_TRACT | Status: DC | PRN
Start: 2017-07-28 — End: 2017-07-28

## 2017-07-28 MED ORDER — OXYCODONE HCL 5 MG PO TABS: 5.0000 mg | ORAL_TABLET | ORAL | Status: DC | PRN

## 2017-07-28 MED ORDER — BACLOFEN 10 MG PO TABS: 10.0000 mg | ORAL_TABLET | Freq: Three times a day (TID) | ORAL | 1 refills | Status: AC | PRN

## 2017-07-28 MED ORDER — CHLORHEXIDINE GLUCONATE 4 % EX LIQD: 60.0000 mL | Freq: Once | CUTANEOUS | Status: DC

## 2017-07-28 MED ORDER — ESCITALOPRAM OXALATE 10 MG PO TABS: 10.0000 mg | ORAL_TABLET | Freq: Every day | ORAL | Status: DC

## 2017-07-28 MED ORDER — HYDROMORPHONE HCL 1 MG/ML IJ SOLN
INTRAMUSCULAR | Status: AC
  Administered 2017-07-28: 0.5 mg via INTRAVENOUS
  Filled 2017-07-28: qty 1

## 2017-07-28 MED ORDER — LACTATED RINGERS IV SOLN: INTRAVENOUS | Status: DC

## 2017-07-28 MED ORDER — FENTANYL CITRATE (PF) 100 MCG/2ML IJ SOLN
INTRAMUSCULAR | Status: AC
  Administered 2017-07-28: 100 ug via INTRAVENOUS
  Filled 2017-07-28: qty 2

## 2017-07-28 MED ORDER — INSULIN DEGLUDEC-LIRAGLUTIDE 100-3.6 UNIT-MG/ML ~~LOC~~ SOPN: 12.0000 [IU] | PEN_INJECTOR | Freq: Every day | SUBCUTANEOUS | Status: DC

## 2017-07-28 MED ORDER — BISACODYL 5 MG PO TBEC: 5.0000 mg | DELAYED_RELEASE_TABLET | Freq: Every day | ORAL | Status: DC | PRN

## 2017-07-28 MED ORDER — PROPOFOL 10 MG/ML IV BOLUS
INTRAVENOUS | Status: AC
  Filled 2017-07-28: qty 20

## 2017-07-28 MED ORDER — SODIUM CHLORIDE 0.9 % IR SOLN
Status: DC | PRN
  Administered 2017-07-28: 1000 mL

## 2017-07-28 MED ORDER — ONDANSETRON HCL 4 MG/2ML IJ SOLN
4.0000 mg | Freq: Four times a day (QID) | INTRAMUSCULAR | Status: DC | PRN
Start: 2017-07-28 — End: 2017-07-29

## 2017-07-28 MED ORDER — ASPIRIN EC 325 MG PO TBEC: 325.0000 mg | DELAYED_RELEASE_TABLET | Freq: Two times a day (BID) | ORAL | 0 refills | Status: DC

## 2017-07-28 MED ORDER — ALBUTEROL SULFATE (2.5 MG/3ML) 0.083% IN NEBU: 2.5000 mg | INHALATION_SOLUTION | Freq: Four times a day (QID) | RESPIRATORY_TRACT | Status: DC | PRN

## 2017-07-28 MED ORDER — BUPIVACAINE-EPINEPHRINE 0.25% -1:200000 IJ SOLN
INTRAMUSCULAR | Status: DC | PRN
  Administered 2017-07-28: 30 mL

## 2017-07-28 MED ORDER — ROCURONIUM BROMIDE 10 MG/ML (PF) SYRINGE
PREFILLED_SYRINGE | INTRAVENOUS | Status: DC | PRN
  Administered 2017-07-28: 40 mg via INTRAVENOUS

## 2017-07-28 MED ORDER — ROPIVACAINE HCL 5 MG/ML IJ SOLN
INTRAMUSCULAR | Status: DC | PRN
  Administered 2017-07-28: 30 mL via PERINEURAL

## 2017-07-28 MED ORDER — PANTOPRAZOLE SODIUM 40 MG PO TBEC: 40.0000 mg | DELAYED_RELEASE_TABLET | Freq: Every day | ORAL | Status: DC

## 2017-07-28 MED ORDER — METOCLOPRAMIDE HCL 5 MG/ML IJ SOLN
10.0000 mg | Freq: Once | INTRAMUSCULAR | Status: DC | PRN
Start: 2017-07-28 — End: 2017-07-28

## 2017-07-28 MED ORDER — MAGNESIUM CITRATE PO SOLN: 1.0000 | Freq: Once | ORAL | Status: DC | PRN

## 2017-07-28 MED ORDER — DIPHENHYDRAMINE HCL 12.5 MG/5ML PO ELIX: 12.5000 mg | ORAL_SOLUTION | ORAL | Status: DC | PRN

## 2017-07-28 MED ORDER — BUPIVACAINE-EPINEPHRINE (PF) 0.25% -1:200000 IJ SOLN
INTRAMUSCULAR | Status: AC
  Filled 2017-07-28: qty 30

## 2017-07-28 MED ORDER — DEXTROSE 50 % IV SOLN
INTRAVENOUS | Status: AC
  Administered 2017-07-28: 25 mL via INTRAVENOUS
  Filled 2017-07-28: qty 50

## 2017-07-28 SURGICAL SUPPLY — 60 items
ADH SKN CLS APL DERMABOND .7 (GAUZE/BANDAGES/DRESSINGS) ×1
BAG DECANTER FOR FLEXI CONT (MISCELLANEOUS) IMPLANT
BAG SPEC THK2 15X12 ZIP CLS (MISCELLANEOUS) ×1
BAG ZIPLOCK 12X15 (MISCELLANEOUS) ×4 IMPLANT
BANDAGE ACE 4X5 VEL STRL LF (GAUZE/BANDAGES/DRESSINGS) ×3 IMPLANT
BANDAGE ACE 6X5 VEL STRL LF (GAUZE/BANDAGES/DRESSINGS) ×3 IMPLANT
BLADE SAG 18X100X1.27 (BLADE) ×3 IMPLANT
BLADE SAW SGTL 13.0X1.19X90.0M (BLADE) ×3 IMPLANT
BONE CEMENT GENTAMICIN (Cement) ×6 IMPLANT
BOWL SMART MIX CTS (DISPOSABLE) ×3 IMPLANT
CAP KNEE TOTAL 3 SIGMA ×2 IMPLANT
CEMENT BONE GENTAMICIN 40 (Cement) IMPLANT
COVER SURGICAL LIGHT HANDLE (MISCELLANEOUS) ×3 IMPLANT
CUFF TOURN SGL QUICK 34 (TOURNIQUET CUFF) ×3
CUFF TRNQT CYL 34X4X40X1 (TOURNIQUET CUFF) ×1 IMPLANT
DECANTER SPIKE VIAL GLASS SM (MISCELLANEOUS) ×3 IMPLANT
DERMABOND ADVANCED (GAUZE/BANDAGES/DRESSINGS) ×2
DERMABOND ADVANCED .7 DNX12 (GAUZE/BANDAGES/DRESSINGS) ×1 IMPLANT
DRAPE U-SHAPE 47X51 STRL (DRAPES) ×3 IMPLANT
DRSG AQUACEL AG ADV 3.5X10 (GAUZE/BANDAGES/DRESSINGS) ×3 IMPLANT
DRSG TEGADERM 4X4.75 (GAUZE/BANDAGES/DRESSINGS) ×3 IMPLANT
DURAPREP 26ML APPLICATOR (WOUND CARE) ×6 IMPLANT
ELECT REM PT RETURN 15FT ADLT (MISCELLANEOUS) ×3 IMPLANT
EVACUATOR 1/8 PVC DRAIN (DRAIN) ×3 IMPLANT
GAUZE SPONGE 2X2 8PLY STRL LF (GAUZE/BANDAGES/DRESSINGS) ×1 IMPLANT
GLOVE BIOGEL PI IND STRL 8 (GLOVE) ×2 IMPLANT
GLOVE BIOGEL PI INDICATOR 8 (GLOVE) ×4
GLOVE ECLIPSE 8.0 STRL XLNG CF (GLOVE) ×6 IMPLANT
GLOVE SURG ORTHO 9.0 STRL STRW (GLOVE) ×3 IMPLANT
GLOVE SURG SS PI 7.5 STRL IVOR (GLOVE) ×3 IMPLANT
GOWN STRL REUS W/TWL XL LVL3 (GOWN DISPOSABLE) ×12 IMPLANT
HANDPIECE INTERPULSE COAX TIP (DISPOSABLE) ×3
IMMOBILIZER KNEE 20 (SOFTGOODS) ×3
IMMOBILIZER KNEE 20 THIGH 36 (SOFTGOODS) ×1 IMPLANT
NS IRRIG 1000ML POUR BTL (IV SOLUTION) ×3 IMPLANT
PACK TOTAL KNEE CUSTOM (KITS) ×3 IMPLANT
POSITIONER SURGICAL ARM (MISCELLANEOUS) ×3 IMPLANT
SET HNDPC FAN SPRY TIP SCT (DISPOSABLE) ×1 IMPLANT
SET PAD KNEE POSITIONER (MISCELLANEOUS) ×3 IMPLANT
SPONGE GAUZE 2X2 STER 10/PKG (GAUZE/BANDAGES/DRESSINGS) ×2
SPONGE LAP 18X18 X RAY DECT (DISPOSABLE) IMPLANT
SPONGE SURGIFOAM ABS GEL 100 (HEMOSTASIS) ×3 IMPLANT
STOCKINETTE 6  STRL (DRAPES) ×2
STOCKINETTE 6 STRL (DRAPES) ×1 IMPLANT
SUCTION FRAZIER HANDLE 12FR (TUBING) ×2
SUCTION TUBE FRAZIER 12FR DISP (TUBING) ×1 IMPLANT
SUT BONE WAX W31G (SUTURE) IMPLANT
SUT MNCRL AB 3-0 PS2 18 (SUTURE) ×3 IMPLANT
SUT VIC AB 1 CT1 27 (SUTURE) ×12
SUT VIC AB 1 CT1 27XBRD ANTBC (SUTURE) ×4 IMPLANT
SUT VIC AB 2-0 CT1 27 (SUTURE) ×6
SUT VIC AB 2-0 CT1 TAPERPNT 27 (SUTURE) ×2 IMPLANT
SUT VLOC 180 0 24IN GS25 (SUTURE) ×3 IMPLANT
SYR 50ML LL SCALE MARK (SYRINGE) ×3 IMPLANT
TAPE STRIPS DRAPE STRL (GAUZE/BANDAGES/DRESSINGS) ×3 IMPLANT
TRAY FOLEY CATH 14FRSI W/METER (CATHETERS) ×2 IMPLANT
TRAY FOLEY W/METER SILVER 16FR (SET/KITS/TRAYS/PACK) ×1 IMPLANT
WATER STERILE IRR 1000ML POUR (IV SOLUTION) ×6 IMPLANT
WRAP KNEE MAXI GEL POST OP (GAUZE/BANDAGES/DRESSINGS) ×3 IMPLANT
YANKAUER SUCT BULB TIP 10FT TU (MISCELLANEOUS) ×3 IMPLANT

## 2017-07-28 NOTE — Interval H&P Note (Signed)
History and Physical Interval Note:  07/28/2017 3:39 PM  Lori Potts  has presented today for surgery, with the diagnosis of right knee osteoarthritis  The various methods of treatment have been discussed with the patient and family. After consideration of risks, benefits and other options for treatment, the patient has consented to  Procedure(s): RIGHT TOTAL KNEE ARTHROPLASTY (Right) as a surgical intervention .  The patient's history has been reviewed, patient examined, no change in status, stable for surgery.  I have reviewed the patient's chart and labs.  Questions were answered to the patient's satisfaction.     Kyri Dai ANDREW   

## 2017-07-28 NOTE — Anesthesia Procedure Notes (Signed)
Procedure Name: Intubation Date/Time: 07/28/2017 4:15 PM Performed by: Xiong Haidar D, CRNA Pre-anesthesia Checklist: Patient identified, Emergency Drugs available, Suction available and Patient being monitored Patient Re-evaluated:Patient Re-evaluated prior to induction Oxygen Delivery Method: Circle system utilized Preoxygenation: Pre-oxygenation with 100% oxygen Induction Type: IV induction Ventilation: Mask ventilation without difficulty Laryngoscope Size: Mac and 3 Grade View: Grade I Tube type: Oral Tube size: 7.0 mm Number of attempts: 1 Airway Equipment and Method: Stylet Placement Confirmation: ETT inserted through vocal cords under direct vision,  positive ETCO2 and breath sounds checked- equal and bilateral Secured at: 19 cm Tube secured with: Tape Dental Injury: Teeth and Oropharynx as per pre-operative assessment

## 2017-07-28 NOTE — Progress Notes (Signed)
Pt CBG 54. Pt non-symptomatic, alert and oriented. Phillips GroutPeter Carignan made aware. Verbal orders for 25 mL Dextrose 50%, IV push.

## 2017-07-28 NOTE — Interval H&P Note (Signed)
History and Physical Interval Note:  07/28/2017 3:39 PM  [name]  has presented today for surgery, with the diagnosis of right knee osteoarthritis  The various methods of treatment have been discussed with the patient and family. After consideration of risks, benefits and other options for treatment, the patient has consented to  Procedure(s): RIGHT TOTAL KNEE ARTHROPLASTY (Right) as a surgical intervention .  The patient's history has been reviewed, patient examined, no change in status, stable for surgery.  I have reviewed the patient's chart and labs.  Questions were answered to the patient's satisfaction.     Lori Potts ANDREW

## 2017-07-28 NOTE — Anesthesia Procedure Notes (Signed)
Anesthesia Regional Block: Adductor canal block   Pre-Anesthetic Checklist: ,, timeout performed, Correct Patient, Correct Site, Correct Laterality, Correct Procedure, Correct Position, site marked, Risks and benefits discussed,  Surgical consent,  Pre-op evaluation,  At surgeon's request and post-op pain management  Laterality: Right and Lower  Prep: Maximum Sterile Barrier Precautions used, chloraprep       Needles:  Injection technique: Single-shot  Needle Type: Echogenic Stimulator Needle     Needle Length: 10cm      Additional Needles:   Procedures:,,,, ultrasound used (permanent image in chart),,,,  Narrative:  Start time: 07/28/2017 3:21 PM End time: 07/28/2017 3:31 PM Injection made incrementally with aspirations every 5 mL.  Performed by: Personally  Anesthesiologist: Phillips Groutarignan, Mirah Nevins, MD  Additional Notes: Risks, benefits and alternative to block explained extensively.  Patient tolerated procedure well, without complications.

## 2017-07-28 NOTE — Transfer of Care (Signed)
Immediate Anesthesia Transfer of Care Note  Patient: Lori Potts  Procedure(s) Performed: RIGHT TOTAL KNEE ARTHROPLASTY (Right Knee)  Patient Location: PACU  Anesthesia Type:General and GA combined with regional for post-op pain  Level of Consciousness: awake, alert  and patient cooperative  Airway & Oxygen Therapy: Patient Spontanous Breathing and Patient connected to face mask oxygen  Post-op Assessment: Report given to RN, Post -op Vital signs reviewed and stable and Patient moving all extremities X 4  Post vital signs: Reviewed and stable  Last Vitals:  Vitals:   07/28/17 1545 07/28/17 1816  BP:  (!) 147/78  Pulse: 72 100  Resp: 12 17  Temp:  36.7 C  SpO2: 100%     Last Pain:  Vitals:   07/28/17 1339  TempSrc:   PainSc: 0-No pain      Patients Stated Pain Goal: 4 (07/28/17 1339)  Complications: No apparent anesthesia complications

## 2017-07-28 NOTE — Telephone Encounter (Signed)
Called pt, she states that the trazadone refill must be an error as she did not request it because it does not work well for her. Pt states that she does not have time to go over med list now as she is heading in for knee surgery.

## 2017-07-28 NOTE — Op Note (Signed)
DATE OF SURGERY:  07/28/2017  TIME: 5:54 PM  PATIENT NAME:  Lori Potts    AGE: 71 y.o.   PRE-OPERATIVE DIAGNOSIS:  right knee osteoarthritis  POST-OPERATIVE DIAGNOSIS:  right knee osteoarthritis  PROCEDURE:  Procedure(s): RIGHT TOTAL KNEE ARTHROPLASTY  SURGEON:  Lugene Hitt ANDREW  ASSISTANT:  Bryson Stilwell, PA-C, present and scrubbed throughout the case, critical for assistance with exposure, retraction, instrumentation, and closure.  OPERATIVE IMPLANTS: Depuy PFC Sigma Rotating Platform.  Femur size 3, Tibia size 2.5, Patella size 32 3-peg oval button, with a 10 mm polyethylene insert.   PREOPERATIVE INDICATIONS:   Lori ChattersMary H Cowing is a 71 y.o. year old female with end stage bone on bone arthritis of the knee who failed conservative treatment and elected for Total Knee Arthroplasty.   The risks, benefits, and alternatives were discussed at length including but not limited to the risks of infection, bleeding, nerve injury, stiffness, blood clots, the need for revision surgery, cardiopulmonary complications, among others, and they were willing to proceed.  OPERATIVE DESCRIPTION:  The patient was brought to the operative room and placed in a supine position.  Spinal anesthesia was administered.  IV antibiotics were given.  The lower extremity was prepped and draped in the usual sterile fashion.  Time out was performed.  The leg was elevated and exsanguinated and the tourniquet was inflated.  Anterior quadriceps tendon splitting approach was performed.  The patella was retracted and osteophytes were removed.  The anterior horn of the medial and lateral meniscus was removed and cruciate ligaments resected.   The distal femur was opened with the drill and the intramedullary distal femoral cutting jig was utilized, set at 5 degrees resecting 10 mm off the distal femur.  Care was taken to protect the collateral ligaments.  The distal femoral sizing jig was applied, taking  care to avoid notching.  Then the 4-in-1 cutting jig was applied and the anterior and posterior femur was cut, along with the chamfer cuts.    Then the extramedullary tibial cutting jig was utilized making the appropriate cut using the anterior tibial crest as a reference building in appropriate posterior slope.  Care was taken during the cut to protect the medial and collateral ligaments.  The proximal tibia was removed along with the posterior horns of the menisci.   The posterior medial femoral osteophytes and posterior lateral femoral osteophytes were removed.    The flexion gap was then measured and was symmetric with the extension gap, measured at 10.  I completed the distal femoral preparation using the appropriate jig to prepare the box.  The patella was then measured, and cut with the saw.    The proximal tibia sized and prepared accordingly with the reamer and the punch, and then all components were trialed with the trial insert.  The knee was found to have excellent balance and full motion.    The above named components were then cemented into place and all excess cement was removed.  The trial polyethylene component was in place during cementation, and then was exchanged for the real polyethylene component.    The knee was easily taken through a range of motion and the patella tracked well and the knee irrigated copiously and the parapatellar and subcutaneous tissue closed with vicryl, and monocryl with steri strips for the skin.  The arthrotomy was closed at 90 of flexion. The wounds were dressed with sterile gauze and the tourniquet released and the patient was awakened and returned to the PACU  in stable and satisfactory condition.  There were no complications.  Total tourniquet time was 78 minutes.

## 2017-07-28 NOTE — Progress Notes (Signed)
AssistedDr. Carignan with right, ultrasound guided, adductor canal block. Side rails up, monitors on throughout procedure. See vital signs in flow sheet. Tolerated Procedure well.  

## 2017-07-28 NOTE — Anesthesia Preprocedure Evaluation (Signed)
Anesthesia Evaluation  Patient identified by MRN, date of birth, ID band Patient awake    Reviewed: Allergy & Precautions, NPO status , Patient's Chart, lab work & pertinent test results  Airway Mallampati: II  TM Distance: >3 FB Neck ROM: Full    Dental no notable dental hx. (+) Edentulous Upper, Edentulous Lower   Pulmonary neg pulmonary ROS, former smoker,    Pulmonary exam normal breath sounds clear to auscultation       Cardiovascular hypertension, Pt. on medications negative cardio ROS Normal cardiovascular exam Rhythm:Regular Rate:Normal     Neuro/Psych LBP Spinal cord stimulator negative neurological ROS  negative psych ROS   GI/Hepatic negative GI ROS, Neg liver ROS,   Endo/Other  negative endocrine ROSdiabetes, Insulin Dependent  Renal/GU negative Renal ROS  negative genitourinary   Musculoskeletal negative musculoskeletal ROS (+)   Abdominal   Peds negative pediatric ROS (+)  Hematology negative hematology ROS (+)   Anesthesia Other Findings   Reproductive/Obstetrics negative OB ROS                             Anesthesia Physical Anesthesia Plan  ASA: II  Anesthesia Plan: General   Post-op Pain Management:  Regional for Post-op pain   Induction: Intravenous  PONV Risk Score and Plan: 3 and Ondansetron, Midazolam and Treatment may vary due to age or medical condition  Airway Management Planned: Oral ETT  Additional Equipment:   Intra-op Plan:   Post-operative Plan: Extubation in OR  Informed Consent: I have reviewed the patients History and Physical, chart, labs and discussed the procedure including the risks, benefits and alternatives for the proposed anesthesia with the patient or authorized representative who has indicated his/her understanding and acceptance.   Dental advisory given  Plan Discussed with: CRNA  Anesthesia Plan Comments: (Adductor block)         Anesthesia Quick Evaluation

## 2017-07-29 DIAGNOSIS — Z794 Long term (current) use of insulin: Secondary | ICD-10-CM | POA: Diagnosis not present

## 2017-07-29 DIAGNOSIS — E559 Vitamin D deficiency, unspecified: Secondary | ICD-10-CM | POA: Diagnosis not present

## 2017-07-29 DIAGNOSIS — E119 Type 2 diabetes mellitus without complications: Secondary | ICD-10-CM | POA: Diagnosis not present

## 2017-07-29 DIAGNOSIS — Z87891 Personal history of nicotine dependence: Secondary | ICD-10-CM | POA: Diagnosis not present

## 2017-07-29 DIAGNOSIS — I10 Essential (primary) hypertension: Secondary | ICD-10-CM | POA: Diagnosis not present

## 2017-07-29 DIAGNOSIS — Z96652 Presence of left artificial knee joint: Secondary | ICD-10-CM | POA: Diagnosis not present

## 2017-07-29 DIAGNOSIS — K219 Gastro-esophageal reflux disease without esophagitis: Secondary | ICD-10-CM | POA: Diagnosis not present

## 2017-07-29 DIAGNOSIS — E7800 Pure hypercholesterolemia, unspecified: Secondary | ICD-10-CM | POA: Diagnosis not present

## 2017-07-29 DIAGNOSIS — R269 Unspecified abnormalities of gait and mobility: Secondary | ICD-10-CM | POA: Diagnosis not present

## 2017-07-29 DIAGNOSIS — M1711 Unilateral primary osteoarthritis, right knee: Secondary | ICD-10-CM | POA: Diagnosis not present

## 2017-07-29 LAB — BASIC METABOLIC PANEL
Anion gap: 8 (ref 5–15)
BUN: 15 mg/dL (ref 6–20)
CO2: 28 mmol/L (ref 22–32)
Calcium: 8.6 mg/dL — ABNORMAL LOW (ref 8.9–10.3)
Chloride: 103 mmol/L (ref 101–111)
Creatinine, Ser: 0.67 mg/dL (ref 0.44–1.00)
GFR calc Af Amer: >60 mL/min (ref >60)
GFR calc non Af Amer: >60 mL/min (ref >60)
Glucose, Bld: 120 mg/dL — ABNORMAL HIGH (ref 65–99)
Potassium: 3.4 mmol/L — ABNORMAL LOW (ref 3.5–5.1)
Sodium: 139 mmol/L (ref 135–145)

## 2017-07-29 LAB — CBC
HCT: 31.6 % — ABNORMAL LOW (ref 36.0–46.0)
Hemoglobin: 10.8 g/dL — ABNORMAL LOW (ref 12.0–15.0)
MCH: 26.7 pg (ref 26.0–34.0)
MCHC: 34.2 g/dL (ref 30.0–36.0)
MCV: 78 fL (ref 78.0–100.0)
Platelets: 226 10*3/uL (ref 150–400)
RBC: 4.05 MIL/uL (ref 3.87–5.11)
RDW: 14.2 % (ref 11.5–15.5)
WBC: 9.2 10*3/uL (ref 4.0–10.5)

## 2017-07-29 LAB — GLUCOSE, CAPILLARY
Glucose-Capillary: 128 mg/dL — ABNORMAL HIGH (ref 65–99)
Glucose-Capillary: 152 mg/dL — ABNORMAL HIGH (ref 65–99)

## 2017-07-29 NOTE — Care Management Obs Status (Signed)
MEDICARE OBSERVATION STATUS NOTIFICATION   Patient Details  Name: Lori Potts MRN: [id number] Date of Birth: 1945-11-20   Medicare Observation Status Notification Given:  Yes    Cherrie DistanceChandler, Makynlee Kressin L, RN 07/29/2017, 3:06 PM

## 2017-07-29 NOTE — Progress Notes (Signed)
Occupational Therapy Evaluation Patient Details Name: Lori Potts MRN: [id number] DOB: Apr 11, 1946 Today's Date: 07/29/2017    History of Present Illness 71yo F s/p R TKA. PMH: L TKA, spinal fusion   Clinical Impression   All OT education completed and pt questions answered. No further OT needs at this time. Patient will have assistance from husband and daughter at discharge. OT will sign off.    Follow Up Recommendations  DC plan and follow up therapy as arranged by surgeon;No OT follow up;Supervision - Intermittent    Equipment Recommendations  Other (comment)(RW)    Recommendations for Other Services PT consult     Precautions / Restrictions Precautions Precautions: None Restrictions Weight Bearing Restrictions: Yes RLE Weight Bearing: Weight bearing as tolerated      Mobility Bed Mobility               General bed mobility comments: NT -- OOB in recliner upon arrival  Transfers                 General transfer comment: pt declined practicing ADL transfers but states she is toileting without difficulty and observed her ambulating in halls with daughter and RW without difficulty    Balance                                           ADL either performed or assessed with clinical judgement   ADL Overall ADL's : Needs assistance/impaired Eating/Feeding: Independent;Sitting   Grooming: Set up;Sitting   Upper Body Bathing: Set up;Sitting   Lower Body Bathing: Minimal assistance;Sit to/from stand   Upper Body Dressing : Set up;Sitting   Lower Body Dressing: Minimal assistance;Sit to/from stand                 General ADL Comments: Patient seen up in the hallways ambulating with her daughter and RW. Patient up in recliner upon arrival. Reviewed LB self-care techniques with patient. She verbalized understanding. Husband and daughter to assist with LB self-care. Patient reports she has been up toileting with 3 in 1 over toilet  without difficulty. She has a tub/shower at home with a seat. Verbal education of tub transfer technique; pt reports she remembers technique from prior TKR. Educated patient to have husband or daughter assist with showering and tub/shower transfer and she verbalized understanding.     Vision         Perception     Praxis      Pertinent Vitals/Pain Pain Assessment: 0-10 Pain Score: 7  Pain Location: R knee Pain Descriptors / Indicators: Aching;Sore Pain Intervention(s): Limited activity within patient's tolerance;Monitored during session     Hand Dominance     Extremity/Trunk Assessment Upper Extremity Assessment Upper Extremity Assessment: Overall WFL for tasks assessed   Lower Extremity Assessment Lower Extremity Assessment: Defer to PT evaluation   Cervical / Trunk Assessment Cervical / Trunk Assessment: Normal   Communication Communication Communication: No difficulties   Cognition Arousal/Alertness: Awake/alert Behavior During Therapy: WFL for tasks assessed/performed Overall Cognitive Status: Within Functional Limits for tasks assessed                                     General Comments       Exercises     Shoulder Instructions  Home Living Family/patient expects to be discharged to:: Private residence Living Arrangements: Spouse/significant other;Children Available Help at Discharge: Family Type of Home: House Home Access: Stairs to enter Secretary/administratorntrance Stairs-Number of Steps: 7   Home Layout: One level     Bathroom Shower/Tub: Chief Strategy OfficerTub/shower unit   Bathroom Toilet: Standard Bathroom Accessibility: Yes How Accessible: Accessible via walker Home Equipment: Bedside commode;Shower seat   Additional Comments: pt states she needs a RW. Educated regarding AE; pt to purchase if she decides she needs it      Prior Functioning/Environment Level of Independence: Independent                 OT Problem List: Decreased  strength;Decreased range of motion;Decreased knowledge of use of DME or AE;Pain      OT Treatment/Interventions:      OT Goals(Current goals can be found in the care plan section) Acute Rehab OT Goals Patient Stated Goal: home today OT Goal Formulation: All assessment and education complete, DC therapy  OT Frequency:     Barriers to D/C:            Co-evaluation              AM-PAC PT "6 Clicks" Daily Activity     Outcome Measure Help from another person eating meals?: None Help from another person taking care of personal grooming?: None Help from another person toileting, which includes using toliet, bedpan, or urinal?: A Little Help from another person bathing (including washing, rinsing, drying)?: A Little Help from another person to put on and taking off regular upper body clothing?: None Help from another person to put on and taking off regular lower body clothing?: A Little 6 Click Score: 21   End of Session Equipment Utilized During Treatment: Rolling walker Nurse Communication: Mobility status  Activity Tolerance: Patient tolerated treatment well Patient left: in chair;with call bell/phone within reach;with family/visitor present  OT Visit Diagnosis: Unsteadiness on feet (R26.81);Muscle weakness (generalized) (M62.81)                Time: [id number] OT Time Calculation (min): 13 min Charges:  OT General Charges $OT Visit: 1 Visit OT Evaluation $OT Eval Low Complexity: 1 Low G-Codes:     Shyloh Derosa A Jaysha Lasure 07/29/2017, 10:53 AM

## 2017-07-29 NOTE — Evaluation (Signed)
Physical Therapy Evaluation Patient Details Name: Lori ChattersMary H Nethery MRN: [id number] DOB: May 15, 1946 Today's Date: 07/29/2017   History of Present Illness  71yo F s/p R TKA. PMH: L TKA, spinal fusion  Clinical Impression  Pt s/p R TKR and presents with decreased R LE strength/ROM and post op pain limiting functional mobility.  Pt should progress to dc home with family assist.    Follow Up Recommendations Home health PT;DC plan and follow up therapy as arranged by surgeon    Equipment Recommendations  Rolling walker with 5" wheels    Recommendations for Other Services       Precautions / Restrictions Precautions Precautions: Fall;Knee Required Braces or Orthoses: Knee Immobilizer - Right Knee Immobilizer - Right: Discontinue once straight leg raise with < 10 degree lag Restrictions Weight Bearing Restrictions: No RLE Weight Bearing: Weight bearing as tolerated      Mobility  Bed Mobility Overal bed mobility: Needs Assistance Bed Mobility: Supine to Sit;Sit to Supine     Supine to sit: Min assist Sit to supine: Min assist   General bed mobility comments: cues for sequence and use of L LE to self assist  Transfers Overall transfer level: Needs assistance Equipment used: Rolling walker (2 wheeled) Transfers: Sit to/from Stand Sit to Stand: Min assist;Min guard         General transfer comment: cues for LE management and use of UEs to self assist  Ambulation/Gait Ambulation/Gait assistance: Min guard Ambulation Distance (Feet): 100 Feet Assistive device: Rolling walker (2 wheeled) Gait Pattern/deviations: Step-to pattern;Decreased step length - right;Decreased step length - left;Shuffle;Trunk flexed Gait velocity: decr Gait velocity interpretation: Below normal speed for age/gender General Gait Details: cues for sequence, posture and position from RW  Stairs Stairs: Yes Stairs assistance: Min assist Stair Management: One rail Right;Forwards;With crutches;Step  to pattern Number of Stairs: 4 General stair comments: cues for sequence and foot/crutch placement; Dtr present and written instruction provided  Wheelchair Mobility    Modified Rankin (Stroke Patients Only)       Balance Overall balance assessment: No apparent balance deficits (not formally assessed)                                           Pertinent Vitals/Pain Pain Assessment: 0-10 Pain Score: 8  Pain Location: R knee Pain Descriptors / Indicators: Aching;Sore Pain Intervention(s): Limited activity within patient's tolerance;Monitored during session;Premedicated before session;Ice applied    Home Living Family/patient expects to be discharged to:: Private residence Living Arrangements: Spouse/significant other;Children Available Help at Discharge: Family Type of Home: House Home Access: Stairs to enter Entrance Stairs-Rails: Doctor, general practiceight;Left Entrance Stairs-Number of Steps: 7 Home Layout: One level Home Equipment: Bedside commode;Shower seat Additional Comments: Pt needs RW    Prior Function Level of Independence: Independent               Hand Dominance        Extremity/Trunk Assessment   Upper Extremity Assessment Upper Extremity Assessment: Overall WFL for tasks assessed    Lower Extremity Assessment Lower Extremity Assessment: RLE deficits/detail RLE Deficits / Details: 2/5 quads with AAROM at knee -10 - 45    Cervical / Trunk Assessment Cervical / Trunk Assessment: Normal  Communication   Communication: No difficulties  Cognition Arousal/Alertness: Awake/alert Behavior During Therapy: WFL for tasks assessed/performed Overall Cognitive Status: Within Functional Limits for tasks assessed  General Comments      Exercises     Assessment/Plan    PT Assessment Patient needs continued PT services  PT Problem List Decreased strength;Decreased range of motion;Decreased  activity tolerance;Decreased mobility;Decreased knowledge of use of DME;Pain       PT Treatment Interventions DME instruction;Gait training;Stair training;Functional mobility training;Therapeutic activities;Therapeutic exercise;Patient/family education    PT Goals (Current goals can be found in the Care Plan section)  Acute Rehab PT Goals Patient Stated Goal: home today PT Goal Formulation: With patient Time For Goal Achievement: 07/30/17 Potential to Achieve Goals: Good    Frequency 7X/week   Barriers to discharge        Co-evaluation               AM-PAC PT "6 Clicks" Daily Activity  Outcome Measure Difficulty turning over in bed (including adjusting bedclothes, sheets and blankets)?: A Lot Difficulty moving from lying on back to sitting on the side of the bed? : Unable Difficulty sitting down on and standing up from a chair with arms (e.g., wheelchair, bedside commode, etc,.)?: Unable Help needed moving to and from a bed to chair (including a wheelchair)?: A Little Help needed walking in hospital room?: A Little Help needed climbing 3-5 steps with a railing? : A Little 6 Click Score: 13    End of Session Equipment Utilized During Treatment: Gait belt;Right knee immobilizer Activity Tolerance: Patient tolerated treatment well Patient left: in bed;with call bell/phone within reach;with family/visitor present Nurse Communication: Mobility status PT Visit Diagnosis: Pain;Difficulty in walking, not elsewhere classified (R26.2) Pain - Right/Left: Right Pain - part of body: Knee    Time: 1048-1130 PT Time Calculation (min) (ACUTE ONLY): 42 min   Charges:   PT Evaluation $PT Eval Low Complexity: 1 Low PT Treatments $Gait Training: 23-37 mins   PT G Codes:        Pg [phone]   Sham Alviar 07/29/2017, 1:22 PM

## 2017-07-29 NOTE — Progress Notes (Signed)
Discharge instructions reviewed with patient and daughter using teach back method patient discharged to home

## 2017-07-29 NOTE — Progress Notes (Signed)
Physical Therapy Treatment Patient Details Name: Lori Potts MRN: [id number] DOB: 1945/12/14 Today's Date: 07/29/2017    History of Present Illness 71yo F s/p R TKA. PMH: L TKA, spinal fusion    PT Comments    Initiated therex program and provided written home therex program.  Reviewed don/doff KI with pt and dtr.   Follow Up Recommendations  Home health PT;DC plan and follow up therapy as arranged by surgeon     Equipment Recommendations  Rolling walker with 5" wheels    Recommendations for Other Services       Precautions / Restrictions Precautions Precautions: Fall;Knee Required Braces or Orthoses: Knee Immobilizer - Right Knee Immobilizer - Right: Discontinue once straight leg raise with < 10 degree lag Restrictions Weight Bearing Restrictions: No RLE Weight Bearing: Weight bearing as tolerated    Mobility  Bed Mobility Overal bed mobility: Needs Assistance Bed Mobility: Supine to Sit;Sit to Supine     Supine to sit: Min assist Sit to supine: Min assist   General bed mobility comments: cues for sequence and use of L LE to self assist  Transfers Overall transfer level: Needs assistance Equipment used: Rolling walker (2 wheeled) Transfers: Sit to/from Stand Sit to Stand: Min assist;Min guard         General transfer comment: cues for LE management and use of UEs to self assist  Ambulation/Gait Ambulation/Gait assistance: Min guard Ambulation Distance (Feet): 100 Feet Assistive device: Rolling walker (2 wheeled) Gait Pattern/deviations: Step-to pattern;Decreased step length - right;Decreased step length - left;Shuffle;Trunk flexed Gait velocity: decr Gait velocity interpretation: Below normal speed for age/gender General Gait Details: cues for sequence, posture and position from RW   Stairs Stairs: Yes   Stair Management: One rail Right;Forwards;With crutches;Step to pattern Number of Stairs: 4 General stair comments: cues for sequence and  foot/crutch placement; Dtr present and written instruction provided  Wheelchair Mobility    Modified Rankin (Stroke Patients Only)       Balance Overall balance assessment: No apparent balance deficits (not formally assessed)                                          Cognition Arousal/Alertness: Awake/alert Behavior During Therapy: WFL for tasks assessed/performed Overall Cognitive Status: Within Functional Limits for tasks assessed                                        Exercises Total Joint Exercises Ankle Circles/Pumps: AROM;Both;15 reps;Supine Quad Sets: AROM;Both;10 reps;Supine Heel Slides: AAROM;Right;15 reps;Supine Straight Leg Raises: AAROM;Right;10 reps;Supine Goniometric ROM: AAROM at R knee -10 - 45    General Comments        Pertinent Vitals/Pain Pain Assessment: 0-10 Pain Score: 8  Pain Location: R knee Pain Descriptors / Indicators: Aching;Sore Pain Intervention(s): Limited activity within patient's tolerance;Monitored during session;Premedicated before session;Ice applied    Home Living Family/patient expects to be discharged to:: Private residence Living Arrangements: Spouse/significant other;Children Available Help at Discharge: Family Type of Home: House Home Access: Stairs to enter Entrance Stairs-Rails: Right;Left Home Layout: One level Home Equipment: Bedside commode;Shower seat Additional Comments: Pt needs RW    Prior Function Level of Independence: Independent          PT Goals (current goals can now be found in the care plan  section) Acute Rehab PT Goals Patient Stated Goal: home today PT Goal Formulation: With patient Time For Goal Achievement: 07/30/17 Potential to Achieve Goals: Good Progress towards PT goals: Progressing toward goals    Frequency    7X/week      PT Plan Current plan remains appropriate    Co-evaluation              AM-PAC PT "6 Clicks" Daily Activity   Outcome Measure  Difficulty turning over in bed (including adjusting bedclothes, sheets and blankets)?: A Lot Difficulty moving from lying on back to sitting on the side of the bed? : Unable Difficulty sitting down on and standing up from a chair with arms (e.g., wheelchair, bedside commode, etc,.)?: Unable Help needed moving to and from a bed to chair (including a wheelchair)?: A Little Help needed walking in hospital room?: A Little Help needed climbing 3-5 steps with a railing? : A Little 6 Click Score: 13    End of Session Equipment Utilized During Treatment: Gait belt;Right knee immobilizer Activity Tolerance: Patient tolerated treatment well Patient left: in bed;with call bell/phone within reach;with family/visitor present Nurse Communication: Mobility status PT Visit Diagnosis: Pain;Difficulty in walking, not elsewhere classified (R26.2) Pain - Right/Left: Right Pain - part of body: Knee     Time: [id number] PT Time Calculation (min) (ACUTE ONLY): 22 min  Charges:  $Gait Training: 23-37 mins $Therapeutic Exercise: 8-22 mins                    G Codes:       Pg [phone]    Alaynna Kerwood 07/29/2017, 1:28 PM

## 2017-07-29 NOTE — Care Management CC44 (Signed)
Condition Code 44 Documentation Completed  Patient Details  Name: Lori Potts MRN: [id number] Date of Birth: 02/03/1946   Condition Code 44 given:  Yes Patient signature on Condition Code 44 notice:  Yes Documentation of 2 MD's agreement:  Yes Code 44 added to claim:  Yes    Cherrie DistanceChandler, Aleathea Pugmire L, RN 07/29/2017, 3:07 PM

## 2017-07-29 NOTE — Progress Notes (Signed)
Patient discharging home today needs rolling walker; Jermane with [facility] called, he is to deliver the rolling walker to the patients room today prior to discharging home; Alexis GoodellB Swathi Dauphin RN,MHA,BSN [phone]

## 2017-07-29 NOTE — Discharge Summary (Signed)
Physician Discharge Summary  Patient ID: Lori Potts MRN: [id number] DOB/AGE: [birth year] 71 y.o.  Admit date: 07/28/2017 Discharge date: 07/29/2017  Admission Diagnoses:  R knee osteoarthritis; hx of abnormal ECG; hx of angina pectoris; hx of chest pain; DM type II; HTN; hx of neck pain; hypercholesterolemia; hx of diverticulosis; hx of R sided sciatica; vasomotor flushing; insomnia; calcification of aorta; GERD; hx of gastritis; vit D deficiency; allergic rhinitis  Discharge Diagnoses:  Active Problems:   S/P knee replacement same as above  Discharged Condition: stable  Hospital Course: Patient presented to the [facility] on 07/28/17 for elective R TKR by Dr. Sydnee Cabal.  The patient tolerated the procedure well and was admitted to the hospital.  She worked well with therapy.  She tolerated her stay well w/o complication.  She is to be D/C'd home on 07/29/17  Consults: PT/OT  Significant Diagnostic Studies: radiology: X-Ray: to ensure satisfacory anatomic alignment during operative procedure.  Treatments: IV hydration, antibiotics: Ancef, analgesia: acetaminophen, Vicodin, Dilaudid and fentanly, anticoagulation: lovenox, insulin: Humalog and surgery: as stated above  Discharge Exam: Blood pressure 136/66, pulse 81, temperature 98.1 F (36.7 C), temperature source Oral, resp. rate 20, height 5' 3" (1.6 m), weight 78.5 kg (173 lb), SpO2 100 %. General: WDWN patient in NAD. Psych:  Appropriate mood and affect. Neuro:  A&O x 3, Moving all extremities, sensation intact to light touch HEENT:  EOMs intact Chest:  Even non-labored respirations Skin:  Dressing C/D/I, no rashes or lesions Extremities: warm/dry, mild edema, no erythmea or echymosis.  No lymphadenopathy. Pulses: Popliteus 2+ MSK:  ROM: lacks 5 degrees of TKE, MMT: able to perform quad set, (-) Homan's   Disposition: 01-Home or Self Care  Discharge Instructions    Call MD / Call 911   Complete by:  As directed     If you experience chest pain or shortness of breath, CALL 911 and be transported to the hospital emergency room.  If you develope a fever above 101 F, pus (white drainage) or increased drainage or redness at the wound, or calf pain, call your surgeon's office.   Call MD / Call 911   Complete by:  As directed    If you experience chest pain or shortness of breath, CALL 911 and be transported to the hospital emergency room.  If you develope a fever above 101 F, pus (white drainage) or increased drainage or redness at the wound, or calf pain, call your surgeon's office.   Constipation Prevention   Complete by:  As directed    Drink plenty of fluids.  Prune juice may be helpful.  You may use a stool softener, such as Colace (over the counter) 100 mg twice a day.  Use MiraLax (over the counter) for constipation as needed.   Constipation Prevention   Complete by:  As directed    Drink plenty of fluids.  Prune juice may be helpful.  You may use a stool softener, such as Colace (over the counter) 100 mg twice a day.  Use MiraLax (over the counter) for constipation as needed.   Diet - low sodium heart healthy   Complete by:  As directed    Diet - low sodium heart healthy   Complete by:  As directed    Discharge instructions   Complete by:  As directed    INSTRUCTIONS AFTER JOINT REPLACEMENT   Remove items at home which could result in a fall. This includes throw rugs or furniture in walking  pathways ICE to the affected joint every three hours while awake for 30 minutes at a time, for at least the first 3-5 days, and then as needed for pain and swelling.  Continue to use ice for pain and swelling. You may notice swelling that will progress down to the foot and ankle.  This is normal after surgery.  Elevate your leg when you are not up walking on it.   Continue to use the breathing machine you got in the hospital (incentive spirometer) which will help keep your temperature down.  It is common for your  temperature to cycle up and down following surgery, especially at night when you are not up moving around and exerting yourself.  The breathing machine keeps your lungs expanded and your temperature down.   DIET:  As you were doing prior to hospitalization, we recommend a well-balanced diet.  DRESSING / WOUND CARE / SHOWERING  Keep the surgical dressing until follow up.  The dressing is water proof, so you can shower without any extra covering.  IF THE DRESSING FALLS OFF or the wound gets wet inside, change the dressing with sterile gauze.  Please use good hand washing techniques before changing the dressing.  Do not use any lotions or creams on the incision until instructed by your surgeon.    ACTIVITY  Increase activity slowly as tolerated, but follow the weight bearing instructions below.   No driving for 6 weeks or until further direction given by your physician.  You cannot drive while taking narcotics.  No lifting or carrying greater than 10 lbs. until further directed by your surgeon. Avoid periods of inactivity such as sitting longer than an hour when not asleep. This helps prevent blood clots.  You may return to work once you are authorized by your doctor.     WEIGHT BEARING   Weight bearing as tolerated with assist device (walker, cane, etc) as directed, use it as long as suggested by your surgeon or therapist, typically at least 4-6 weeks.   EXERCISES  Results after joint replacement surgery are often greatly improved when you follow the exercise, range of motion and muscle strengthening exercises prescribed by your doctor. Safety measures are also important to protect the joint from further injury. Any time any of these exercises cause you to have increased pain or swelling, decrease what you are doing until you are comfortable again and then slowly increase them. If you have problems or questions, call your caregiver or physical therapist for advice.   Rehabilitation is  important following a joint replacement. After just a few days of immobilization, the muscles of the leg can become weakened and shrink (atrophy).  These exercises are designed to build up the tone and strength of the thigh and leg muscles and to improve motion. Often times heat used for twenty to thirty minutes before working out will loosen up your tissues and help with improving the range of motion but do not use heat for the first two weeks following surgery (sometimes heat can increase post-operative swelling).   These exercises can be done on a training (exercise) mat, on the floor, on a table or on a bed. Use whatever works the best and is most comfortable for you.    Use music or television while you are exercising so that the exercises are a pleasant break in your day. This will make your life better with the exercises acting as a break in your routine that you can look forward to.  Perform all exercises about fifteen times, three times per day or as directed.  You should exercise both the operative leg and the other leg as well.   Exercises include:   Quad Sets - Tighten up the muscle on the front of the thigh (Quad) and hold for 5-10 seconds.   Straight Leg Raises - With your knee straight (if you were given a brace, keep it on), lift the leg to 60 degrees, hold for 3 seconds, and slowly lower the leg.  Perform this exercise against resistance later as your leg gets stronger.  Leg Slides: Lying on your back, slowly slide your foot toward your buttocks, bending your knee up off the floor (only go as far as is comfortable). Then slowly slide your foot back down until your leg is flat on the floor again.  Angel Wings: Lying on your back spread your legs to the side as far apart as you can without causing discomfort.  Hamstring Strength:  Lying on your back, push your heel against the floor with your leg straight by tightening up the muscles of your buttocks.  Repeat, but this time bend your knee  to a comfortable angle, and push your heel against the floor.  You may put a pillow under the heel to make it more comfortable if necessary.   A rehabilitation program following joint replacement surgery can speed recovery and prevent re-injury in the future due to weakened muscles. Contact your doctor or a physical therapist for more information on knee rehabilitation.    CONSTIPATION  Constipation is defined medically as fewer than three stools per week and severe constipation as less than one stool per week.  Even if you have a regular bowel pattern at home, your normal regimen is likely to be disrupted due to multiple reasons following surgery.  Combination of anesthesia, postoperative narcotics, change in appetite and fluid intake all can affect your bowels.   YOU MUST use at least one of the following options; they are listed in order of increasing strength to get the job done.  They are all available over the counter, and you may need to use some, POSSIBLY even all of these options:    Drink plenty of fluids (prune juice may be helpful) and high fiber foods Colace 100 mg by mouth twice a day  Senokot for constipation as directed and as needed Dulcolax (bisacodyl), take with full glass of water  Miralax (polyethylene glycol) once or twice a day as needed.  If you have tried all these things and are unable to have a bowel movement in the first 3-4 days after surgery call either your surgeon or your primary doctor.    If you experience loose stools or diarrhea, hold the medications until you stool forms back up.  If your symptoms do not get better within 1 week or if they get worse, check with your doctor.  If you experience "the worst abdominal pain ever" or develop nausea or vomiting, please contact the office immediately for further recommendations for treatment.   ITCHING:  If you experience itching with your medications, try taking only a single pain pill, or even half a pain pill at a  time.  You can also use Benadryl over the counter for itching or also to help with sleep.   TED HOSE STOCKINGS:  Use stockings on both legs until for at least 2 weeks or as directed by physician office. They may be removed at night for sleeping.  MEDICATIONS:  See your medication summary on the "After Visit Summary" that nursing will review with you.  You may have some home medications which will be placed on hold until you complete the course of blood thinner medication.  It is important for you to complete the blood thinner medication as prescribed.  PRECAUTIONS:  If you experience chest pain or shortness of breath - call 911 immediately for transfer to the hospital emergency department.   If you develop a fever greater that 101 F, purulent drainage from wound, increased redness or drainage from wound, foul odor from the wound/dressing, or calf pain - CONTACT YOUR SURGEON.                                                   FOLLOW-UP APPOINTMENTS:  If you do not already have a post-op appointment, please call the office for an appointment to be seen by your surgeon.  Guidelines for how soon to be seen are listed in your "After Visit Summary", but are typically between 1-4 weeks after surgery.  OTHER INSTRUCTIONS:   Knee Replacement:  Do not place pillow under knee, focus on keeping the knee straight while resting. CPM instructions: 0-90 degrees, 2 hours in the morning, 2 hours in the afternoon, and 2 hours in the evening. Place foam block, curve side up under heel at all times except when in CPM or when walking.  DO NOT modify, tear, cut, or change the foam block in any way.  MAKE SURE YOU:  Understand these instructions.  Get help right away if you are not doing well or get worse.    Thank you for letting us be a part of your medical care team.  It is a privilege we respect greatly.  We hope these instructions will help you stay on track for a fast and full recovery!   Increase activity slowly  as tolerated   Complete by:  As directed    Increase activity slowly as tolerated   Complete by:  As directed    Weight bearing as tolerated   Complete by:  As directed    Laterality:  right   Extremity:  Lower     Allergies as of 07/29/2017      Reactions   Adhesive [tape] Rash, Other (See Comments)   Regular tape is ok, allergy is to paper tape      Medication List    TAKE these medications   ACCU-CHEK NANO SMARTVIEW w/Device Kit CHECK BLOOD SUGAR TWICE DAILY   albuterol 108 (90 Base) MCG/ACT inhaler Commonly known as:  PROVENTIL HFA;VENTOLIN HFA Inhale 2 puffs into the lungs every 6 (six) hours as needed (for wheezing and shortness of breath from seasonal allergies). What changed:  reasons to take this   albuterol (2.5 MG/3ML) 0.083% nebulizer solution Commonly known as:  PROVENTIL Take 2.5 mg by nebulization every 6 (six) hours as needed for wheezing or shortness of breath. What changed:  Another medication with the same name was changed. Make sure you understand how and when to take each.   aspirin EC 325 MG tablet Take 1 tablet (325 mg total) by mouth 2 (two) times daily.   atorvastatin 40 MG tablet Commonly known as:  LIPITOR Take 1 tablet (40 mg total) by mouth at bedtime.   baclofen 10 MG tablet Commonly known as:  LIORESAL  Take 1 tablet (10 mg total) by mouth 3 (three) times daily as needed for muscle spasms.   dexlansoprazole 60 MG capsule Commonly known as:  DEXILANT Take 1 capsule (60 mg total) by mouth daily.   escitalopram 10 MG tablet Commonly known as:  LEXAPRO Take 1 tablet (10 mg total) daily by mouth.   flunisolide 25 MCG/ACT (0.025%) Soln Commonly known as:  NASALIDE Place 2 sprays into the nose 2 (two) times daily.   glucose blood test strip Commonly known as:  ACCU-CHEK SMARTVIEW TEST BLOOD SUGARS 4 TIMES DAILY   Insulin Pen Needle 29G X 12.7MM Misc Commonly known as:  BD ULTRA-FINE PEN NEEDLES 1 pen by Does not apply route once.    ipratropium-albuterol 0.5-2.5 (3) MG/3ML Soln Commonly known as:  DUONEB TAKE 3 MLS BY NEBULIZATION EVERY 4 (FOUR) HOURS AS NEEDED. What changed:  reasons to take this   levocetirizine 5 MG tablet Commonly known as:  XYZAL Take 1 tablet (5 mg total) by mouth daily as needed for allergies.   lisinopril 20 MG tablet Commonly known as:  PRINIVIL,ZESTRIL Take 20 mg by mouth daily.   mometasone 50 MCG/ACT nasal spray Commonly known as:  NASONEX Place 2 sprays into the nose daily. What changed:    when to take this  reasons to take this   montelukast 10 MG tablet Commonly known as:  SINGULAIR Take 1 tablet (10 mg total) by mouth at bedtime.   omeprazole 20 MG capsule Commonly known as:  PRILOSEC Take 20 mg by mouth 2 (two) times daily before a meal.   oxyCODONE 5 MG immediate release tablet Commonly known as:  ROXICODONE Take 1 tablet (5 mg total) by mouth every 4 (four) hours as needed.   oxyCODONE-acetaminophen 10-325 MG tablet Commonly known as:  PERCOCET Take 1 tablet by mouth every 4 (four) hours as needed for pain.   Vitamin D (Cholecalciferol) 1000 units Tabs Take 1,000 Units by mouth daily.   XULTOPHY 100-3.6 UNIT-MG/ML Sopn Generic drug:  Insulin Degludec-Liraglutide Inject 12 Units into the skin at bedtime.            Discharge Care Instructions  (From admission, onward)        Start     Ordered   07/29/17 0000  Weight bearing as tolerated    Question Answer Comment  Laterality right   Extremity Lower      07/29/17 0912     Follow-up Information    Sydnee Cabal, MD. Schedule an appointment as soon as possible for a visit in 2 week(s).   Specialty:  Orthopedic Surgery Contact information: [address] [place] [address] [phone]           Signed: Justin Ollis, [place] Office:  [phone]

## 2017-07-29 NOTE — Progress Notes (Signed)
Subjective: 1 Day Post-Op Procedure(s) (LRB): RIGHT TOTAL KNEE ARTHROPLASTY (Right)  Patient reports pain as mild to moderate.  Tolerating POs well.  Admits to flatus.  Denies fever, chills, N/V, CP, SOB.  Patient reports that she worked well with therapy and wants to go home today.  Daughter at bedside.  Objective:   VITALS:  Temp:  [97.3 F (36.3 C)-98.1 F (36.7 C)] 98.1 F (36.7 C) (12/08 0418) Pulse Rate:  [71-100] 81 (12/08 0418) Resp:  [10-21] 20 (12/08 0418) BP: (132-165)/(50-88) 136/66 (12/08 0418) SpO2:  [94 %-100 %] 100 % (12/08 0418) Weight:  [78.5 kg (173 lb)] 78.5 kg (173 lb) (12/07 1352)  General: WDWN patient in NAD. Psych:  Appropriate mood and affect. Neuro:  A&O x 3, Moving all extremities, sensation intact to light touch HEENT:  EOMs intact Chest:  Even non-labored respirations Skin: Dressing C/D/I, no rashes or lesions Extremities: warm/dry, mild edema, no erythmea or echymosis.  No lymphadenopathy. Pulses: Popliteus 2+ MSK:  ROM: lacks 5 degrees of TKE, MMT: able to perform a quad set, (-) Homan's    LABS Recent Labs    07/29/17 0540  HGB 10.8*  WBC 9.2  PLT 226   Recent Labs    07/29/17 0540  NA 139  K 3.4*  CL 103  CO2 28  BUN 15  CREATININE 0.67  GLUCOSE 120*   No results for input(s): LABPT, INR in the last 72 hours.   Assessment/Plan: 1 Day Post-Op Procedure(s) (LRB): RIGHT TOTAL KNEE ARTHROPLASTY (Right)  Patient seen in rounds for Dr. Kennith Centerollins Drain pulled WBAT R LE D/C home today Scripts on the chart Plan for 2 week outpatient post-op visit with Dr. Cristal Generousollins  Darnisha Vernet, PA-C [facility] Office:  [phone]

## 2017-07-30 ENCOUNTER — Encounter (HOSPITAL_COMMUNITY): Payer: Self-pay | Admitting: Specialist

## 2017-07-30 NOTE — Anesthesia Postprocedure Evaluation (Signed)
Anesthesia Post Note  Patient: Lori Potts  Procedure(s) Performed: RIGHT TOTAL KNEE ARTHROPLASTY (Right Knee)     Patient location during evaluation: PACU Anesthesia Type: General Level of consciousness: awake and alert Pain management: pain level controlled Vital Signs Assessment: post-procedure vital signs reviewed and stable Respiratory status: spontaneous breathing, nonlabored ventilation, respiratory function stable and patient connected to nasal cannula oxygen Cardiovascular status: blood pressure returned to baseline and stable Postop Assessment: no apparent nausea or vomiting Anesthetic complications: no    Last Vitals:  Vitals:   07/29/17 1318 07/29/17 1435  BP: (!) 146/53 (!) 156/59  Pulse: 90 96  Resp: 18 16  Temp:    SpO2: 96% 95%    Last Pain:  Vitals:   07/29/17 1412  TempSrc:   PainSc: Asleep                 Phillips Groutarignan, Draven Laine

## 2017-07-31 NOTE — Progress Notes (Addendum)
Occupational Therapy Evaluation Addendum:    07/29/17 1000  OT G-codes **NOT FOR INPATIENT CLASS**  Functional Assessment Tool Used AM-PAC 6 Clicks Daily Activity  Functional Limitation Self care  Self Care Current Status ([id number]([id number]) CJ  Self Care Goal Status ([id number]([id number]) [facility]  Self Care Discharge Status [phone]([id number]) Trinna BalloonJ  Alberta Lenhard, OTR/L [phone]

## 2017-08-02 DIAGNOSIS — Z96651 Presence of right artificial knee joint: Secondary | ICD-10-CM | POA: Diagnosis not present

## 2017-08-02 DIAGNOSIS — Z471 Aftercare following joint replacement surgery: Secondary | ICD-10-CM | POA: Diagnosis not present

## 2017-08-02 DIAGNOSIS — M542 Cervicalgia: Secondary | ICD-10-CM | POA: Diagnosis not present

## 2017-08-02 DIAGNOSIS — E119 Type 2 diabetes mellitus without complications: Secondary | ICD-10-CM | POA: Diagnosis not present

## 2017-08-02 DIAGNOSIS — I10 Essential (primary) hypertension: Secondary | ICD-10-CM | POA: Diagnosis not present

## 2017-08-02 DIAGNOSIS — M5431 Sciatica, right side: Secondary | ICD-10-CM | POA: Diagnosis not present

## 2017-08-04 DIAGNOSIS — M542 Cervicalgia: Secondary | ICD-10-CM | POA: Diagnosis not present

## 2017-08-04 DIAGNOSIS — Z471 Aftercare following joint replacement surgery: Secondary | ICD-10-CM | POA: Diagnosis not present

## 2017-08-04 DIAGNOSIS — M5431 Sciatica, right side: Secondary | ICD-10-CM | POA: Diagnosis not present

## 2017-08-04 DIAGNOSIS — I10 Essential (primary) hypertension: Secondary | ICD-10-CM | POA: Diagnosis not present

## 2017-08-04 DIAGNOSIS — Z96651 Presence of right artificial knee joint: Secondary | ICD-10-CM | POA: Diagnosis not present

## 2017-08-04 DIAGNOSIS — E119 Type 2 diabetes mellitus without complications: Secondary | ICD-10-CM | POA: Diagnosis not present

## 2017-08-07 DIAGNOSIS — M542 Cervicalgia: Secondary | ICD-10-CM | POA: Diagnosis not present

## 2017-08-07 DIAGNOSIS — Z96651 Presence of right artificial knee joint: Secondary | ICD-10-CM | POA: Diagnosis not present

## 2017-08-07 DIAGNOSIS — E119 Type 2 diabetes mellitus without complications: Secondary | ICD-10-CM | POA: Diagnosis not present

## 2017-08-07 DIAGNOSIS — Z471 Aftercare following joint replacement surgery: Secondary | ICD-10-CM | POA: Diagnosis not present

## 2017-08-07 DIAGNOSIS — I10 Essential (primary) hypertension: Secondary | ICD-10-CM | POA: Diagnosis not present

## 2017-08-07 DIAGNOSIS — M5431 Sciatica, right side: Secondary | ICD-10-CM | POA: Diagnosis not present

## 2017-08-09 DIAGNOSIS — M5431 Sciatica, right side: Secondary | ICD-10-CM | POA: Diagnosis not present

## 2017-08-09 DIAGNOSIS — I10 Essential (primary) hypertension: Secondary | ICD-10-CM | POA: Diagnosis not present

## 2017-08-09 DIAGNOSIS — Z96651 Presence of right artificial knee joint: Secondary | ICD-10-CM | POA: Diagnosis not present

## 2017-08-09 DIAGNOSIS — M542 Cervicalgia: Secondary | ICD-10-CM | POA: Diagnosis not present

## 2017-08-09 DIAGNOSIS — E119 Type 2 diabetes mellitus without complications: Secondary | ICD-10-CM | POA: Diagnosis not present

## 2017-08-09 DIAGNOSIS — Z471 Aftercare following joint replacement surgery: Secondary | ICD-10-CM | POA: Diagnosis not present

## 2017-08-10 DIAGNOSIS — Z471 Aftercare following joint replacement surgery: Secondary | ICD-10-CM | POA: Diagnosis not present

## 2017-08-10 DIAGNOSIS — Z96651 Presence of right artificial knee joint: Secondary | ICD-10-CM | POA: Diagnosis not present

## 2017-08-11 DIAGNOSIS — M542 Cervicalgia: Secondary | ICD-10-CM | POA: Diagnosis not present

## 2017-08-11 DIAGNOSIS — E119 Type 2 diabetes mellitus without complications: Secondary | ICD-10-CM | POA: Diagnosis not present

## 2017-08-11 DIAGNOSIS — I10 Essential (primary) hypertension: Secondary | ICD-10-CM | POA: Diagnosis not present

## 2017-08-11 DIAGNOSIS — M5431 Sciatica, right side: Secondary | ICD-10-CM | POA: Diagnosis not present

## 2017-08-11 DIAGNOSIS — Z471 Aftercare following joint replacement surgery: Secondary | ICD-10-CM | POA: Diagnosis not present

## 2017-08-11 DIAGNOSIS — Z96651 Presence of right artificial knee joint: Secondary | ICD-10-CM | POA: Diagnosis not present

## 2017-08-14 DIAGNOSIS — E119 Type 2 diabetes mellitus without complications: Secondary | ICD-10-CM | POA: Diagnosis not present

## 2017-08-14 DIAGNOSIS — Z96651 Presence of right artificial knee joint: Secondary | ICD-10-CM | POA: Diagnosis not present

## 2017-08-14 DIAGNOSIS — M5431 Sciatica, right side: Secondary | ICD-10-CM | POA: Diagnosis not present

## 2017-08-14 DIAGNOSIS — M542 Cervicalgia: Secondary | ICD-10-CM | POA: Diagnosis not present

## 2017-08-14 DIAGNOSIS — Z471 Aftercare following joint replacement surgery: Secondary | ICD-10-CM | POA: Diagnosis not present

## 2017-08-14 DIAGNOSIS — I10 Essential (primary) hypertension: Secondary | ICD-10-CM | POA: Diagnosis not present

## 2017-08-18 DIAGNOSIS — M542 Cervicalgia: Secondary | ICD-10-CM | POA: Diagnosis not present

## 2017-08-18 DIAGNOSIS — Z471 Aftercare following joint replacement surgery: Secondary | ICD-10-CM | POA: Diagnosis not present

## 2017-08-18 DIAGNOSIS — M5431 Sciatica, right side: Secondary | ICD-10-CM | POA: Diagnosis not present

## 2017-08-18 DIAGNOSIS — Z96651 Presence of right artificial knee joint: Secondary | ICD-10-CM | POA: Diagnosis not present

## 2017-08-18 DIAGNOSIS — I10 Essential (primary) hypertension: Secondary | ICD-10-CM | POA: Diagnosis not present

## 2017-08-18 DIAGNOSIS — E119 Type 2 diabetes mellitus without complications: Secondary | ICD-10-CM | POA: Diagnosis not present

## 2017-08-29 DIAGNOSIS — M25661 Stiffness of right knee, not elsewhere classified: Secondary | ICD-10-CM | POA: Diagnosis not present

## 2017-08-29 DIAGNOSIS — M25561 Pain in right knee: Secondary | ICD-10-CM | POA: Diagnosis not present

## 2017-09-05 DIAGNOSIS — M25561 Pain in right knee: Secondary | ICD-10-CM | POA: Diagnosis not present

## 2017-09-05 DIAGNOSIS — M25661 Stiffness of right knee, not elsewhere classified: Secondary | ICD-10-CM | POA: Diagnosis not present

## 2017-09-07 ENCOUNTER — Other Ambulatory Visit: Payer: Self-pay | Admitting: *Deleted

## 2017-09-07 DIAGNOSIS — M25661 Stiffness of right knee, not elsewhere classified: Secondary | ICD-10-CM | POA: Diagnosis not present

## 2017-09-07 DIAGNOSIS — M25561 Pain in right knee: Secondary | ICD-10-CM | POA: Diagnosis not present

## 2017-09-07 NOTE — Patient Outreach (Signed)
[place] [facility]) Care Management  09/07/2017   MISKI FELDPAUSCH [birth year] [id number]  Subjective: RN Health Coach telephone call to patient.  Hipaa compliance verified. Per patient she is in a lot of pain from her total knee surgery. Per patient she has lost 20 pounds with her weight now 155 pounds. Per patient she is having some difficulty affording her medications especially the oxycodone pain medication. RN discussed referring to pharmacy and patient agreed. Patient A1C is 5.9. Patient has met her A1C goal. Per patient her appetite has decrease some. She is using glucerna as a supplement.  RN will look at closing next out reach follow up . Patient has agreed to follow up outreach call.    Current Medications:  Current Outpatient Medications  Medication Sig Dispense Refill  . albuterol (PROVENTIL HFA;VENTOLIN HFA) 108 (90 Base) MCG/ACT inhaler Inhale 2 puffs into the lungs every 6 (six) hours as needed (for wheezing and shortness of breath from seasonal allergies). (Patient taking differently: Inhale 2 puffs into the lungs every 6 (six) hours as needed for wheezing or shortness of breath. ) 1 Inhaler 1  . albuterol (PROVENTIL) (2.5 MG/3ML) 0.083% nebulizer solution Take 2.5 mg by nebulization every 6 (six) hours as needed for wheezing or shortness of breath.     Marland Kitchen aspirin EC 325 MG tablet Take 1 tablet (325 mg total) by mouth 2 (two) times daily. 60 tablet 0  . atorvastatin (LIPITOR) 40 MG tablet Take 1 tablet (40 mg total) by mouth at bedtime. 90 tablet 1  . baclofen (LIORESAL) 10 MG tablet Take 1 tablet (10 mg total) by mouth 3 (three) times daily as needed for muscle spasms. 50 tablet 1  . Blood Glucose Monitoring Suppl (ACCU-CHEK NANO SMARTVIEW) w/Device KIT CHECK BLOOD SUGAR TWICE DAILY (Patient not taking: Reported on 07/17/2017) 1 kit 0  . dexlansoprazole (DEXILANT) 60 MG capsule Take 1 capsule (60 mg total) by mouth daily. (Patient not taking: Reported on 07/17/2017) 30  capsule 6  . escitalopram (LEXAPRO) 10 MG tablet Take 1 tablet (10 mg total) daily by mouth. (Patient not taking: Reported on 07/17/2017) 30 tablet 2  . flunisolide (NASALIDE) 25 MCG/ACT (0.025%) SOLN Place 2 sprays into the nose 2 (two) times daily. (Patient not taking: Reported on 07/17/2017) 1 Bottle 0  . glucose blood (ACCU-CHEK SMARTVIEW) test strip TEST BLOOD SUGARS 4 TIMES DAILY (Patient not taking: Reported on 07/17/2017) 150 each PRN  . Insulin Degludec-Liraglutide (XULTOPHY) 100-3.6 UNIT-MG/ML SOPN Inject 12 Units into the skin at bedtime.    . Insulin Pen Needle (BD ULTRA-FINE PEN NEEDLES) 29G X 12.7MM MISC 1 pen by Does not apply route once. (Patient not taking: Reported on 07/17/2017) 100 each 3  . ipratropium-albuterol (DUONEB) 0.5-2.5 (3) MG/3ML SOLN TAKE 3 MLS BY NEBULIZATION EVERY 4 (FOUR) HOURS AS NEEDED. (Patient taking differently: Take 3 mLs by nebulization every 4 (four) hours as needed (for shortness of breath or wheezing). ) 360 mL 3  . levocetirizine (XYZAL) 5 MG tablet Take 1 tablet (5 mg total) by mouth daily as needed for allergies. 90 tablet 3  . lisinopril (PRINIVIL,ZESTRIL) 20 MG tablet Take 20 mg by mouth daily.    . mometasone (NASONEX) 50 MCG/ACT nasal spray Place 2 sprays into the nose daily. (Patient taking differently: Place 2 sprays into the nose daily as needed (for allergies). ) 17 g 0  . montelukast (SINGULAIR) 10 MG tablet Take 1 tablet (10 mg total) by mouth at bedtime. 30 tablet 3  .  omeprazole (PRILOSEC) 20 MG capsule Take 20 mg by mouth 2 (two) times daily before a meal.    . oxyCODONE (ROXICODONE) 5 MG immediate release tablet Take 1 tablet (5 mg total) by mouth every 4 (four) hours as needed. 40 tablet 0  . oxyCODONE-acetaminophen (PERCOCET) 10-325 MG tablet Take 1 tablet by mouth every 4 (four) hours as needed for pain.    . Vitamin D, Cholecalciferol, 1000 units TABS Take 1,000 Units by mouth daily. 90 tablet 3   No current facility-administered  medications for this visit.     Functional Status:  In your present state of health, do you have any difficulty performing the following activities: 09/07/2017 07/28/2017  Hearing? N -  Vision? Y -  Difficulty concentrating or making decisions? N -  Walking or climbing stairs? Y -  Dressing or bathing? N -  Doing errands, shopping? Tempie Donning  Preparing Food and eating ? N -  Using the Toilet? N -  In the past six months, have you accidently leaked urine? Y -  Do you have problems with loss of bowel control? N -  Managing your Medications? Y -  Managing your Finances? N -  Housekeeping or managing your Housekeeping? Y -  Some recent data might be hidden    Fall/Depression Screening: Fall Risk  09/07/2017 06/28/2017 06/12/2017  Falls in the past year? No No No  Number falls in past yr: - - 1  Injury with Fall? - - No  Risk for fall due to : Impaired balance/gait;Impaired mobility;History of fall(s) - Impaired balance/gait;Impaired mobility  Follow up - - Education provided;Falls evaluation completed;Falls prevention discussed   PHQ 2/9 Scores 09/07/2017 06/28/2017 06/12/2017 05/26/2017 04/13/2017 02/10/2017 02/03/2017  PHQ - 2 Score 1 1 0 0 0 0 0   [facility] CM Care Plan Problem One     Most Recent Value  Care Plan Problem One  Knowledge Deficit in Self Management of Diabetes  Role Documenting the Problem One  [place] for Problem One  Active  [facility] Long Term Goal   Patient will see A1C decrease with next blood draw within the next 90 days  [facility] Long Term Goal Met Date  09/07/17  Interventions for Problem One Long Term Goal  RN discussed A1C goals. RN discussed fasting blood glucose range needs to be 80- 130 to see the A1C range < 7  [facility] CM Short Term Goal #1   Patient will verbalize receiving education material on health maintenance and make appointments within the next 30 days  [facility] CM Short Term Goal #1 Start Date  09/07/17  Interventions for Short Term Goal #1  RN sent educational  material on the importance of health maintenance [place] as eye and dental appointments. Patient will report making appointments.RN will follow up with next outreach  [facility] CM Short Term Goal #2   Patient will verbalize adhering  to physiclal therapy appointments within the next 30 days  [facility] CM Short Term Goal #2 Start Date  09/07/17  Interventions for Short Term Goal #2  RN discussed with patient about going to physicial therapy and how this will strengthen  her knee and will help the pain to decrease. RN will follow up with further discussion  [facility] CM Short Term Goal #3 Met Date  09/07/17  Interventions for Short Tern Goal #3  RN sent patient a list of healthy snacks to eat. RN discussed as patient shopping to pick up some of the items as  her snack. RN willfollow up with discussion and teachback.       Assessment:  Patient has had total knee replacement A1C is 5.9 Per patient she has lost 20 pounds and her weight is now 155 pounds Per patient she is having some difficulty affording her medication Plan: Referred to pharmacy RN sent EMMI educational material on Health Maintenance RN sent 2019 Calendar book to document blood sugars and appointments RN discussed the importance of physical therapy.  RN will follow up outreach within the month of February   Zoua Caporaso [place] Management [phone]

## 2017-09-11 ENCOUNTER — Ambulatory Visit (INDEPENDENT_AMBULATORY_CARE_PROVIDER_SITE_OTHER): Payer: Medicare HMO | Admitting: Family Medicine

## 2017-09-11 ENCOUNTER — Encounter: Payer: Self-pay | Admitting: Family Medicine

## 2017-09-11 VITALS — BP 130/80 | HR 75 | Temp 97.8°F | Resp 16 | Ht 63.0 in | Wt 165.4 lb

## 2017-09-11 DIAGNOSIS — R000 Tachycardia, unspecified: Secondary | ICD-10-CM | POA: Diagnosis not present

## 2017-09-11 DIAGNOSIS — R634 Abnormal weight loss: Secondary | ICD-10-CM | POA: Diagnosis not present

## 2017-09-11 DIAGNOSIS — R5383 Other fatigue: Secondary | ICD-10-CM

## 2017-09-11 DIAGNOSIS — F419 Anxiety disorder, unspecified: Secondary | ICD-10-CM | POA: Diagnosis not present

## 2017-09-11 DIAGNOSIS — Z79899 Other long term (current) drug therapy: Secondary | ICD-10-CM | POA: Diagnosis not present

## 2017-09-11 DIAGNOSIS — E1165 Type 2 diabetes mellitus with hyperglycemia: Secondary | ICD-10-CM

## 2017-09-11 DIAGNOSIS — E559 Vitamin D deficiency, unspecified: Secondary | ICD-10-CM | POA: Diagnosis not present

## 2017-09-11 DIAGNOSIS — IMO0001 Reserved for inherently not codable concepts without codable children: Secondary | ICD-10-CM

## 2017-09-11 NOTE — Patient Instructions (Addendum)
Check your Blood Sugar every morning before you eat/drink anything and ANY TIME you are feeling bad (anxious, like your heart is racing).  If your BG is <85, please call our office for further instructions. Make sure you are drinking plenty of water throughout the day.

## 2017-09-11 NOTE — Progress Notes (Signed)
Name: Lori Potts   MRN: [id number]    DOB: September 26, 1945   Date:09/11/2017       Progress Note  Subjective  Chief Complaint  Chief Complaint  Patient presents with  . Fatigue    decreased energy, stay cold sincxce havinf knee surgery    HPI  Patient presents with concern for decreased energy, feeling anxious, feels like her heart is beating fast almost daily that began after having RIGHT knee replacement on 07/28/17.  She has lost some weight (9lbs since last visit with our clinic) - she notes weightloss started after Xultophy - explained that this is a part of the goal of starting Liraglutide.  Endorses occasional sweating, chronic headaches without change  - No particular stressors in her life, but she says she worries a lot about everyone in your life and globally when she watches the news etc. - Denies chest pain, shortness of breath, body aches, fevers/chills, cough, recent travel, sick contacts, rashes, abdominal pain, NVD, blood in stool, dark and tarry stools, easy bruising, calf swelling/tenderness/redness. - Former smoker quit about 10 years ago; Used to smoke 2-10 cigarettes/day for about 50 years of on-and-off smoking. - RIGHT knee has been rehabilitating well - she notes some intermittent swelling, but pain has been significantly improved and she is no longer using her cane.  - DM: Last A1C was 12/18 and was 5.9; doing well on Xultophy 12 units daily. Denies polyuria, polydipsia, or polyphagia - Colonoscopy: UTD - had April 10, 2017, told to return in 10 years.  Patient Active Problem List   Diagnosis Date Noted  . S/P knee replacement 07/28/2017  . Allergic rhinitis 05/26/2017  . Vitamin D deficiency 05/19/2017  . Chronic nonintractable headache 05/18/2017  . Special screening for malignant neoplasms, colon   . Gastroesophageal reflux disease   . Gastritis without bleeding   . 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 Insomnia 11/29/2016  . Chronic right-sided low  back pain with right-sided sciatica 04/19/2016  . Vasomotor flushing 04/19/2016  . Abdominal wall pain in right flank 03/29/2016  . Diverticulosis 02/24/2016  . Hypercholesterolemia 06/16/2015  . FHx: migraine headaches 04/13/2015  . Chronic pain 04/13/2015  . Neck pain 02/26/2015  . Status post lumbar surgery 02/26/2015  . Abnormal ECG 01/27/2015  . Angina pectoris ([place]) 01/27/2015  . Chest pain 01/27/2015  . Diabetes mellitus type 2, uncontrolled, without complications ([place]) 02/58/5277  . BP (high blood pressure) 01/27/2015    Social History   Tobacco Use  . Smoking status: Former Smoker    Types: Cigarettes    Last attempt to quit: 2006    Years since quitting: 13.0  . Smokeless tobacco: Never Used  . Tobacco comment: quit smoking 26yr ago  Substance Use Topics  . Alcohol use: No     Current Outpatient Medications:  .  albuterol (PROVENTIL HFA;VENTOLIN HFA) 108 (90 Base) MCG/ACT inhaler, Inhale 2 puffs into the lungs every 6 (six) hours as needed (for wheezing and shortness of breath from seasonal allergies). (Patient taking differently: Inhale 2 puffs into the lungs every 6 (six) hours as needed for wheezing or shortness of breath. ), Disp: 1 Inhaler, Rfl: 1 .  albuterol (PROVENTIL) (2.5 MG/3ML) 0.083% nebulizer solution, Take 2.5 mg by nebulization every 6 (six) hours as needed for wheezing or shortness of breath. , Disp: , Rfl:  .  aspirin EC 325 MG tablet, Take 1 tablet (325 mg total) by mouth 2 (two) times daily., Disp: 60 tablet, Rfl: 0 .  atorvastatin (LIPITOR) 40 MG tablet, Take 1 tablet (40 mg total) by mouth at bedtime., Disp: 90 tablet, Rfl: 1 .  baclofen (LIORESAL) 10 MG tablet, Take 1 tablet (10 mg total) by mouth 3 (three) times daily as needed for muscle spasms., Disp: 50 tablet, Rfl: 1 .  Blood Glucose Monitoring Suppl (ACCU-CHEK NANO SMARTVIEW) w/Device KIT, CHECK BLOOD SUGAR TWICE DAILY, Disp: 1 kit, Rfl: 0 .  dexlansoprazole (DEXILANT) 60 MG capsule, Take 1  capsule (60 mg total) by mouth daily., Disp: 30 capsule, Rfl: 6 .  escitalopram (LEXAPRO) 10 MG tablet, Take 1 tablet (10 mg total) daily by mouth., Disp: 30 tablet, Rfl: 2 .  flunisolide (NASALIDE) 25 MCG/ACT (0.025%) SOLN, Place 2 sprays into the nose 2 (two) times daily., Disp: 1 Bottle, Rfl: 0 .  glucose blood (ACCU-CHEK SMARTVIEW) test strip, TEST BLOOD SUGARS 4 TIMES DAILY, Disp: 150 each, Rfl: PRN .  Insulin Degludec-Liraglutide (XULTOPHY) 100-3.6 UNIT-MG/ML SOPN, Inject 12 Units into the skin at bedtime., Disp: , Rfl:  .  Insulin Pen Needle (BD ULTRA-FINE PEN NEEDLES) 29G X 12.7MM MISC, 1 pen by Does not apply route once., Disp: 100 each, Rfl: 3 .  ipratropium-albuterol (DUONEB) 0.5-2.5 (3) MG/3ML SOLN, TAKE 3 MLS BY NEBULIZATION EVERY 4 (FOUR) HOURS AS NEEDED. (Patient taking differently: Take 3 mLs by nebulization every 4 (four) hours as needed (for shortness of breath or wheezing). ), Disp: 360 mL, Rfl: 3 .  levocetirizine (XYZAL) 5 MG tablet, Take 1 tablet (5 mg total) by mouth daily as needed for allergies., Disp: 90 tablet, Rfl: 3 .  lisinopril (PRINIVIL,ZESTRIL) 20 MG tablet, Take 20 mg by mouth daily., Disp: , Rfl:  .  mometasone (NASONEX) 50 MCG/ACT nasal spray, Place 2 sprays into the nose daily. (Patient taking differently: Place 2 sprays into the nose daily as needed (for allergies). ), Disp: 17 g, Rfl: 0 .  montelukast (SINGULAIR) 10 MG tablet, Take 1 tablet (10 mg total) by mouth at bedtime., Disp: 30 tablet, Rfl: 3 .  omeprazole (PRILOSEC) 20 MG capsule, Take 20 mg by mouth 2 (two) times daily before a meal., Disp: , Rfl:  .  oxyCODONE (ROXICODONE) 5 MG immediate release tablet, Take 1 tablet (5 mg total) by mouth every 4 (four) hours as needed., Disp: 40 tablet, Rfl: 0 .  oxyCODONE-acetaminophen (PERCOCET) 10-325 MG tablet, Take 1 tablet by mouth every 4 (four) hours as needed for pain., Disp: , Rfl:  .  Vitamin D, Cholecalciferol, 1000 units TABS, Take 1,000 Units by mouth  daily., Disp: 90 tablet, Rfl: 3  Allergies  Allergen Reactions  . Adhesive [Tape] Rash and Other (See Comments)    Regular tape is ok, allergy is to paper tape    ROS  Ten systems reviewed and is negative except as mentioned in HPI  Objective  Vitals:   09/11/17 1327 09/11/17 1345  BP: 130/80   Pulse: (!) 102 75  Resp: 16   Temp: 97.8 F (36.6 C)   TempSrc: Oral   SpO2: 96%   Weight: 165 lb 6.4 oz (75 kg)   Height: '5\' 3"'$  (1.6 m)    Body mass index is 29.3 kg/m.  Nursing Note and Vital Signs reviewed.  Physical Exam  Constitutional: Patient appears well-developed and well-nourished.  No distress.  HEENT: head atraumatic, normocephalic, pupils equal and reactive to light, EOM's intact, TM's without erythema or bulging, neck supple without lymphadenopathy, oropharynx pink and moist without exudate Cardiovascular: Normal rate, regular rhythm, S1/S2 present.  No murmur or rub heard. No BLE edema. Pulmonary/Chest: Effort normal and breath sounds clear. No respiratory distress or retractions. Abdominal: Soft and non-tender, bowel sounds present x4 quadrants.  Psychiatric: Patient has a normal mood and affect. behavior is normal. Judgment and thought content normal. Neurological: she is alert and oriented to person, place, and time. No cranial nerve deficit. Coordination, balance, strength, speech and gait are normal.  Skin: Skin is warm and dry. No rash noted. No erythema.   No results found for this or any previous visit (from the past 72 hour(s)).  Assessment & Plan  1. Fatigue, unspecified type - CBC w/Diff/Platelet - COMPLETE METABOLIC PANEL WITH GFR - TSH - VITAMIN D 25 Hydroxy (Vit-D Deficiency, Fractures) - Vitamin B12 - EKG 12-Lead - Sinus Rhythm.  2. Anxiety - Discussed option for SSRI initiation in the future after labs etc are performed if no other cause is determined.  She is very open to starting medication if needed. - CBC w/Diff/Platelet - TSH  3.  Diabetes mellitus type 2, uncontrolled, without complications (HCC) - COMPLETE METABOLIC PANEL WITH GFR - Advised: Check your Blood Sugar every morning before you eat/drink anything and ANY TIME you are feeling bad (anxious, like your heart is racing).  If your BG is <85, please call our office for further instructions. - Last A1C was 5.9 - concerned that BG's may be going too low on Xultophy and we may need to change her medication. Pt will keep log and bring to her follow up appointment. What to do if hypoglycemic is discussed in detail.  4. Unintentional weight loss - CBC w/Diff/Platelet - COMPLETE METABOLIC PANEL WITH GFR - TSH  5. Tachycardia, unspecified - CBC w/Diff/Platelet - COMPLETE METABOLIC PANEL WITH GFR - TSH - EKG 12-Lead - Sinus Rhythm - Initially pt had HR 102, after sitting for about 15 minutes, HR decreased to 75 bpm.   -Red flags and when to present for emergency care or RTC including fever >101.56F, chest pain, shortness of breath, new/worsening/un-resolving symptoms, reviewed with patient at time of visit. Follow up and care instructions discussed and provided in AVS.

## 2017-09-12 ENCOUNTER — Other Ambulatory Visit: Payer: Self-pay | Admitting: Family Medicine

## 2017-09-12 ENCOUNTER — Telehealth: Payer: Self-pay | Admitting: Family Medicine

## 2017-09-12 DIAGNOSIS — D649 Anemia, unspecified: Secondary | ICD-10-CM

## 2017-09-12 LAB — CBC WITH DIFFERENTIAL/PLATELET
Basophils Absolute: 30 cells/uL (ref 0–200)
Basophils Relative: 0.8 %
Eosinophils Absolute: 152 cells/uL (ref 15–500)
Eosinophils Relative: 4 %
HCT: 32.7 % — ABNORMAL LOW (ref 35.0–45.0)
Hemoglobin: 10.7 g/dL — ABNORMAL LOW (ref 11.7–15.5)
Lymphs Abs: 977 cells/uL (ref 850–3900)
MCH: 25.1 pg — ABNORMAL LOW (ref 27.0–33.0)
MCHC: 32.7 g/dL (ref 32.0–36.0)
MCV: 76.6 fL — ABNORMAL LOW (ref 80.0–100.0)
MPV: 10.9 fL (ref 7.5–12.5)
Monocytes Relative: 7.4 %
Neutro Abs: 2360 cells/uL (ref 1500–7800)
Neutrophils Relative %: 62.1 %
Platelets: 375 10*3/uL (ref 140–400)
RBC: 4.27 10*6/uL (ref 3.80–5.10)
RDW: 14.2 % (ref 11.0–15.0)
Total Lymphocyte: 25.7 %
WBC mixed population: 281 cells/uL (ref 200–950)
WBC: 3.8 10*3/uL (ref 3.8–10.8)

## 2017-09-12 LAB — COMPLETE METABOLIC PANEL WITH GFR
AG Ratio: 1.4 (calc) (ref 1.0–2.5)
ALT: 12 U/L (ref 6–29)
AST: 16 U/L (ref 10–35)
Albumin: 4.1 g/dL (ref 3.6–5.1)
Alkaline phosphatase (APISO): 88 U/L (ref 33–130)
BUN: 18 mg/dL (ref 7–25)
CO2: 27 mmol/L (ref 20–32)
Calcium: 9.6 mg/dL (ref 8.6–10.4)
Chloride: 107 mmol/L (ref 98–110)
Creat: 0.61 mg/dL (ref 0.60–0.93)
GFR, Est African American: 106 mL/min/{1.73_m2} (ref >=60)
GFR, Est Non African American: 91 mL/min/{1.73_m2} (ref >=60)
Globulin: 2.9 g/dL (calc) (ref 1.9–3.7)
Glucose, Bld: 85 mg/dL (ref 65–99)
Potassium: 3.6 mmol/L (ref 3.5–5.3)
Sodium: 143 mmol/L (ref 135–146)
Total Bilirubin: 0.3 mg/dL (ref 0.2–1.2)
Total Protein: 7 g/dL (ref 6.1–8.1)

## 2017-09-12 LAB — VITAMIN D 25 HYDROXY (VIT D DEFICIENCY, FRACTURES): Vit D, 25-Hydroxy: 25 ng/mL — ABNORMAL LOW (ref 30–100)

## 2017-09-12 LAB — IRON,TIBC AND FERRITIN PANEL
%SAT: 22 % (calc) (ref 11–50)
Ferritin: 81 ng/mL (ref 20–288)
Iron: 68 ug/dL (ref 45–160)
TIBC: 309 mcg/dL (calc) (ref 250–450)

## 2017-09-12 LAB — TEST AUTHORIZATION

## 2017-09-12 LAB — TSH: TSH: 1.08 mIU/L (ref 0.40–4.50)

## 2017-09-12 LAB — VITAMIN B12: Vitamin B-12: 896 pg/mL (ref 200–1100)

## 2017-09-12 MED ORDER — LIRAGLUTIDE 18 MG/3ML ~~LOC~~ SOPN: PEN_INJECTOR | SUBCUTANEOUS | 2 refills | Status: DC

## 2017-09-12 NOTE — Telephone Encounter (Signed)
Pt.notified

## 2017-09-12 NOTE — Telephone Encounter (Signed)
Please have patient schedule a visit for her diabetes in the next 1-2 weeks STOP the xultophy Start just plain victoza Follow instructions on the new prescription Do not take any other insulins or injections

## 2017-09-15 ENCOUNTER — Other Ambulatory Visit: Payer: Self-pay | Admitting: Pharmacist

## 2017-09-15 NOTE — Patient Outreach (Signed)
[facility]) Care Management  09/15/2017  Lori ChattersMary H Potts 04/28/1946 [id number]  72 year old [facility].  [facility] assistance with pain medication.  PMHx includes, but not limited to, recent right knee replacement, diabetes mellitus type 2, HTN, chronic pain, diverticulosis, GERD, and former smoker.   Insurance: [employer] Gold Plus [phone]  Oxycodone and Oxycodone-acetaminophen are both Tier 3 medications = $47 copays  Pharmacy: [facility] in [place]  Unsuccessful telephone call to Lori Potts today.  I left a HIPPA compliant voicemail on the mobile phone.  No answer on the home phone and no voicemail set up to leave a message.    Plan: I will follow-up with Lori Potts regarding medication assistance next week.   Lori Potts, PharmD, [facility] Clinical Pharmacist [facility] [phone]

## 2017-09-18 ENCOUNTER — Ambulatory Visit: Payer: Self-pay | Admitting: Family Medicine

## 2017-09-19 ENCOUNTER — Ambulatory Visit: Payer: Self-pay | Admitting: Pharmacist

## 2017-09-19 ENCOUNTER — Other Ambulatory Visit: Payer: Self-pay | Admitting: Pharmacist

## 2017-09-19 DIAGNOSIS — M25661 Stiffness of right knee, not elsewhere classified: Secondary | ICD-10-CM | POA: Diagnosis not present

## 2017-09-19 DIAGNOSIS — M25561 Pain in right knee: Secondary | ICD-10-CM | POA: Diagnosis not present

## 2017-09-19 NOTE — Patient Outreach (Addendum)
[place] [facility]) Care Management  09/19/2017  Lori Potts [birth year] [id number]  72 year old female referred to [place] [facility] for diabetes management.  [place] services requested for medication assistance with pain medication.  PMHx includes, but not limited to, recent right knee replacement, diabetes mellitus type 2, HTN, chronic pain, diverticulosis, GERD, and former smoker.   Subjective:   Successful telephone call with Lori Potts this afternoon. Marland KitchenHIPAA identifiers verified.  Lori Potts is agreeable to [facility] pharmacy services.  She confirms that she has [employer] Medicare Part D plan.  She reports any medication assistance with any medication would be helpful as she and her husband combined have a monthly income ~ $2300.   She reports that she needs a new glucometer as her current meter is not working properly.  She reports she is out of test strips and lancets.  She reports using samples of Victoza from Dr. Sanda Klein and has not tried to have this filled yet through her pharmacy.   Objective:  SCr = 0.61 (09/11/17) Vitamin D = 25 (09/11/17) Hemoglobin A1C = 5.9 (07/24/17)  Medications Reviewed Today    Reviewed by Rudean Haskell, RPH (Pharmacist) on 09/20/17 at 1314  Med List Status: <None>  Medication Order Taking? Sig Documenting Provider Last Dose Status Informant  albuterol (PROVENTIL HFA;VENTOLIN HFA) 108 (90 Base) MCG/ACT inhaler [id number] Yes Inhale 2 puffs into the lungs every 6 (six) hours as needed (for wheezing and shortness of breath from seasonal allergies).  Patient taking differently:  Inhale 2 puffs into the lungs every 6 (six) hours as needed for wheezing or shortness of breath.    Ashok Norris, MD Taking Active Self           Med Note Bonna Gains Jul 17, 2017  5:23 PM)    albuterol (PROVENTIL) (2.5 MG/3ML) 0.083% nebulizer solution [id number] Yes Take 2.5 mg by nebulization every 6 (six) hours as needed for wheezing or shortness  of breath.  [provider] Taking Active Self           Med Note Bonna Gains Jul 17, 2017  5:23 PM)         Patient not taking:       Discontinued 09/20/17 1312 (Completed Course)   aspirin EC 81 MG tablet [id number] Yes Take 81 mg by mouth daily. [provider] Taking Active Self  atorvastatin (LIPITOR) 40 MG tablet [id number] Yes Take 1 tablet (40 mg total) by mouth at bedtime. Arnetha Courser, MD Taking Active Self  baclofen (LIORESAL) 10 MG tablet [id number] Yes Take 1 tablet (10 mg total) by mouth 3 (three) times daily as needed for muscle spasms. Lajean Manes, PA-C Taking Active   Blood Glucose Monitoring Suppl (ACCU-CHEK NANO SMARTVIEW) w/Device KIT [id number] Yes CHECK BLOOD SUGAR TWICE DAILY Roselee Nova, MD Taking Active Self  dexlansoprazole (DEXILANT) 60 MG capsule [id number] Yes Take 1 capsule (60 mg total) by mouth daily. Lucilla Lame, MD Taking Active Self  escitalopram (LEXAPRO) 10 MG tablet [id number] No Take 1 tablet (10 mg total) daily by mouth.  Patient not taking:  Reported on 09/19/2017   Arnetha Courser, MD Not Taking Active Self       Patient taking differently:       Discontinued 09/20/17 1313 (Change in therapy)   glucose blood (ACCU-CHEK SMARTVIEW) test strip [id number] Yes TEST BLOOD SUGARS 4 TIMES DAILY Ashok Norris, MD Taking Active Self  Insulin Pen Needle (BD ULTRA-FINE PEN NEEDLES) 29G X 12.7MM MISC [id number] Yes 1 pen by Does not apply route once. Ashok Norris, MD Taking Active Self  ipratropium-albuterol (DUONEB) 0.5-2.5 (3) MG/3ML SOLN [id number] Yes TAKE 3 MLS BY NEBULIZATION EVERY 4 (FOUR) HOURS AS NEEDED.  Patient taking differently:  Take 3 mLs by nebulization every 4 (four) hours as needed (for shortness of breath or wheezing).    Roselee Nova, MD Taking Active Self  levocetirizine (XYZAL) 5 MG tablet [id number] Yes Take 1 tablet (5 mg total) by mouth daily as needed for allergies. Arnetha Courser, MD  Taking Active   liraglutide (VICTOZA) 18 MG/3ML SOPN [id number] Yes Inject 0.6 mg daily subcutaneously; this REPLACES Xultophy -- do not use both Lada, Satira Anis, MD Taking Active   lisinopril (PRINIVIL,ZESTRIL) 20 MG tablet [id number] No Take 20 mg by mouth daily. [provider] Not Taking Active Self  mometasone (NASONEX) 50 MCG/ACT nasal spray [id number] Yes Place 2 sprays into the nose daily.  Patient taking differently:  Place 2 sprays into the nose daily as needed (for allergies).    Roselee Nova, MD Taking Active Self  montelukast (SINGULAIR) 10 MG tablet [id number] Yes Take 1 tablet (10 mg total) by mouth at bedtime. Arnetha Courser, MD Taking Active Self        Discontinued 09/20/17 1314 (Change in therapy)        Patient not taking:       Discontinued 09/20/17 1314 (Completed Course)   oxyCODONE-acetaminophen (PERCOCET) 10-325 MG tablet [id number] Yes Take 1 tablet by mouth every 4 (four) hours as needed for pain. [provider] Taking Active Self  Vitamin D, Cholecalciferol, 1000 units TABS [id number] No Take 1,000 Units by mouth daily.  Patient not taking:  Reported on 09/19/2017   Hubbard Hartshorn, [name] Not Taking Active Self         Assessment:   Drugs sorted by system:  Neurologic/Psychologic: escitalopram  Cardiovascular: aspirin, atorvastatin, lisinopril  Pulmonary/Allergy: Albuterol, ipratropium-albuterol, levocetirizine, mometasone, Singulair  Gastrointestinal: dexlansoprazole  Endocrine: liraglutide  Renal: none  Topical: none  Pain: baclofen, oxycodone   Vitamins/Minerals: vitamin D  Infectious Diseases: none  Miscellaneous: none  Duplications in therapy: Albuterol nebulizer / inhaler + duonebs. Patient aware to only use either nebulizer or inhaler PRN.   Gaps in therapy:  N/A  Medications to avoid in the elderly:  Dexlansoprazole: Per Beers criteria, proton pump inhibitors are identified as potentially inappropriate medications to  be avoided as scheduled use for more than 8 weeks in patients 65 years and older due to its risk of C. difficile infection and bone loss/fractures. Use for more than 8 weeks should be avoided unless given for high-risk patients (e.g., oral corticosteroid or chronic NSAID use), patients with erosive esophagitis, Barrett's esophagitis, a pathological hypersecretory condition, or if the patient has demonstrated a need for maintenance therapy (e.g., failure of drug discontinuation trial or failure of H2 blockers).    Oxycodone-APAP: Per Beers, avoid use in geriatric patients with a history of falls or fractures except for pain management due to recent fractures or joint replacement. May be more susceptible to respiratory depression. Use with caution in elderly patients and consider reduced dosage.   Drug interactions: N/A  Other issues noted:  Patient reports no longer taking flunisolide, omeprazole, or oxycodone IR as therapy has been changed or discontinued.  Medications removed from medication list.    Dexlansoprazole:  Per PI, doses > '30mg'$  do not  provide additional benefit.  Consider reducing dose as clinically warranted.   Medication adherence:  Patient reports she is not taking the following: - Vitamin D because no refills written on prescription  - Escitalopram because she did not feel that it was helping - Lisinopril because of a cough.  She may benefit from changing to an ARB to avoid cough side effect.   Medication assistance:  -Preliminarily not eligible for LIS / Extra Help based on estimated income reported by patient.   -Victoza [employer] PAP requires $1000 out of pocket expenditure before patient eligible.  We reviewed that her co-pay will likely by $47/ month.   -Albuterol inhaler: Patient eligible for Proventil through [employer] as no TROOP required.  Patient agreeable to apply for [employer] PAP.    -Reviewed [employer] OTC catalogue with patient who was not aware of this benefit.  -Reviewed  [employer] mail order benefits including glucometer, test strips, lancets, and alcohol wipes at no charge if using brand on formulary -Reviewed $0 copay for Tier 1 and Tier 2 medications via [facility] mail order pharmacy.  Patient agreeable to utilize this for any covered medication.  This includes (atorvastatin, escitalopram, omeprazole)  3 way phone call placed to [facility] with patient.  OTC catalogue will be mailed to patient and patient confirmed to have $50 every 3 months to use on OTC catalogue products. [employer] representative faxed request for Accu-check nano glucometer and supplies to Dr. Delight Ovens office.      We reviewed that patient has an upcoming appointment with Dr. Sanda Klein this Friday at [place]: I will route my note to Dr. Sanda Klein regarding non-compliance with escitalopram, vitamin D, and lisinopril, PAP for Proventil, and request for new prescriptions for 90 day supply for all Tier 1 and Tier 2 medications be sent to [facility] as well as glucometer and supplies.    I will follow-up with patient and provider office on Friday following her appointment with Dr. Sanda Klein.   Ralene Bathe, PharmD, [place] [phone]

## 2017-09-21 ENCOUNTER — Other Ambulatory Visit: Payer: Self-pay

## 2017-09-21 DIAGNOSIS — IMO0001 Reserved for inherently not codable concepts without codable children: Secondary | ICD-10-CM

## 2017-09-21 DIAGNOSIS — E1165 Type 2 diabetes mellitus with hyperglycemia: Principal | ICD-10-CM

## 2017-09-21 MED ORDER — ACCU-CHEK NANO SMARTVIEW W/DEVICE KIT: PACK | 0 refills | Status: DC

## 2017-09-21 MED ORDER — ACCU-CHEK SMARTVIEW CONTROL VI LIQD: 3 refills | Status: DC

## 2017-09-21 NOTE — Telephone Encounter (Signed)
Request received for calibration liquid and new glucometer; Rx approved

## 2017-09-21 NOTE — Telephone Encounter (Signed)
Incoming request. Please advise.

## 2017-09-22 ENCOUNTER — Encounter: Payer: Self-pay | Admitting: Family Medicine

## 2017-09-22 ENCOUNTER — Ambulatory Visit (INDEPENDENT_AMBULATORY_CARE_PROVIDER_SITE_OTHER): Payer: Medicare HMO | Admitting: Family Medicine

## 2017-09-22 DIAGNOSIS — E7800 Pure hypercholesterolemia, unspecified: Secondary | ICD-10-CM

## 2017-09-22 DIAGNOSIS — Z96651 Presence of right artificial knee joint: Secondary | ICD-10-CM

## 2017-09-22 DIAGNOSIS — IMO0001 Reserved for inherently not codable concepts without codable children: Secondary | ICD-10-CM

## 2017-09-22 DIAGNOSIS — E1165 Type 2 diabetes mellitus with hyperglycemia: Secondary | ICD-10-CM | POA: Diagnosis not present

## 2017-09-22 DIAGNOSIS — E559 Vitamin D deficiency, unspecified: Secondary | ICD-10-CM

## 2017-09-22 DIAGNOSIS — D649 Anemia, unspecified: Secondary | ICD-10-CM | POA: Insufficient documentation

## 2017-09-22 MED ORDER — ALBUTEROL SULFATE HFA 108 (90 BASE) MCG/ACT IN AERS: 2.0000 | INHALATION_SPRAY | RESPIRATORY_TRACT | 0 refills | Status: DC | PRN

## 2017-09-22 MED ORDER — ATORVASTATIN CALCIUM 40 MG PO TABS: 40.0000 mg | ORAL_TABLET | Freq: Every day | ORAL | 1 refills | Status: DC

## 2017-09-22 MED ORDER — GLUCOSE BLOOD VI STRP: ORAL_STRIP | 3 refills | Status: DC

## 2017-09-22 NOTE — Assessment & Plan Note (Signed)
Normal iron studies; may have been post-operative; recheck in one month; colonoscopy UTD per patient

## 2017-09-22 NOTE — Assessment & Plan Note (Signed)
Having pain, so encouraged her to call her orthopaedist

## 2017-09-22 NOTE — Assessment & Plan Note (Signed)
Stopped ACE-I because of cough; BP right now is excellent; will monitor and consider ARB in future for kidney protection

## 2017-09-22 NOTE — Assessment & Plan Note (Signed)
Check in one month, just had labs done and will not repeat today; avoid saturated fats; continue statin

## 2017-09-22 NOTE — Patient Instructions (Addendum)
Please call Dr. Thomasena Edisollins and let him know how much you are hurting Please take 2,000 iu of vitamin D3 once a day Call Dr. Servando SnareWohl if there is a question about your treatment for your stomach Caution: prolonged use of proton pump inhibitors like omeprazole (Prilosec), pantoprazole (Protonix), esomeprazole (Nexium), and others like Dexilant and Aciphex may increase your risk of pneumonia, Clostridium difficile colitis, osteoporosis, anemia and other health complications Try to limit or avoid triggers like coffee, caffeinated beverages, onions, chocolate, spicy foods, peppermint, acidic foods like pizza, spaghetti sauce, and orange juice Lose weight if you are overweight or obese Try elevating the head of your bed by placing a small wedge between your mattress and box springs to keep acid in the stomach at night instead of coming up into your esophagus You can try melatonin 3 mg at the exact same time in the evening for 3 weeks to reset your clock

## 2017-09-22 NOTE — Progress Notes (Signed)
BP 118/76   Pulse 77   Temp 97.6 F (36.4 C) (Oral)   Resp 14   Wt 166 lb 8 oz (75.5 kg)   SpO2 93%   BMI 29.49 kg/m    Subjective:    Patient ID: Lori Potts, female    DOB: 1945/11/07, 72 y.o.   MRN: [id number]  HPI: Lori Potts is a 72 y.o. female  Chief Complaint  Patient presents with  . Follow-up  . Diabetes  . Knee Pain    bilateral, cantsleep at night    HPI Patient is here for f/u  See pharm note, reviewed with patient Not taking ACE-I because of cough; last dose 3 weeks ago Off of the SSRI, started when family member died, but doing better Wearing fitness watch; never checks the steps; limited with walking because of knee Type 2 diabetes mellitus; on Victoza; has lost weight, A1c has come way down from 9.4 to 5.9 Needs new testing supplies On atorvastatin for cholesterol Having heartburn; taking one BID she thinks; she says it is purple; omeprazole 20 mg BID Dr. Servando Snare has prescribed dexilant once a day Right knee, s/p replacement; hurts so much she cries; good ROM; hurts when she lays down; can't get in a comfortable position; surgeon is Dr. Thomasena Edis; this is her 9th week post-op; left knee went great Anemia noted, but iron studies all normal; lost blood with knee surgery Weight stabilizied after initial loss on victoza  Depression screen [facility] 2/9 09/22/2017 09/07/2017 06/28/2017 06/12/2017 05/26/2017  Decreased Interest 0 0 0 0 0  Down, Depressed, Hopeless 0 1 1 0 0  PHQ - 2 Score 0 1 1 0 0    Relevant past medical, surgical, family and social history reviewed Past Medical History:  Diagnosis Date  . Arthritis   . 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 Cataract    right eye but immature  . Essential hypertension, benign    takes Lisinopril-HCTZ daily  . GERD (gastroesophageal reflux disease)   . Headache    sinus  . History of colon polyps    benign  . History of migraine   . History of shingles   . Hyperlipidemia    takes Lipitor  daily  . Low back pain   . Nocturia   . S/P insertion of spinal cord stimulator   . Seasonal allergies    takes Singulair daily as needed  . Type II or unspecified type diabetes mellitus without mention of complication, not stated as uncontrolled   . Vitamin D deficiency    takes Vit D weekly  . Weakness    numbness and tingling left arm  . Wears dentures    full upper and lower   Past Surgical History:  Procedure Laterality Date  . ABDOMINAL HYSTERECTOMY    . ANTERIOR CERVICAL DECOMP/DISCECTOMY FUSION N/A 02/26/2015   Procedure: ANTERIOR CERVICAL DISCECTOMY FUSION C4-5 (1 LEVEL);  Surgeon: Venita Lick, MD;  [facility] OR;  Service: Orthopedics;  Laterality: N/A;  . ANTERIOR LAT LUMBAR FUSION N/A 03/21/2013   Procedure: ANTERIOR LATERAL LUMBAR FUSION 1 LEVEL/ XLIF L3-L4 ;  Surgeon: Venita Lick, MD;  Location: [facility] OR;  Service: Orthopedics;  Laterality: N/A;  . APPENDECTOMY    . AUGMENTATION MAMMAPLASTY Bilateral 1978  . BACK SURGERY    . BACK SURGERY     Lumbar fusion x 2  . BREAST EXCISIONAL BIOPSY Right 1970  . BREAST SURGERY  1990   Augementation  . CARDIAC CATHETERIZATION  5/12   ef 55%  . CARDIAC CATHETERIZATION  10/2010   [facility]; EF 55%  . CARDIAC CATHETERIZATION Left 02/16/2016   Procedure: Left Heart Cath and Coronary Angiography;  Surgeon: Alwyn Peawayne D Callwood, MD;  Location: [facility] INVASIVE CV LAB;  Service: Cardiovascular;  Laterality: Left;  . COLONOSCOPY    . COLONOSCOPY WITH PROPOFOL N/A 04/10/2017   Procedure: COLONOSCOPY WITH PROPOFOL;  Surgeon: Midge MiniumWohl, Darren, MD;  [facility];  Service: Gastroenterology;  Laterality: N/A;  Diabetic - insulin  . ESOPHAGOGASTRODUODENOSCOPY    . KIDNEY SURGERY  1998   growth removed from left kidney   . pain stimulator    . POSTERIOR CERVICAL FUSION/FORAMINOTOMY Right 03/21/2013   Procedure: POSTERIOR L2-3 RIGHT FORAMINOTOMY;  Surgeon: Venita Lickahari Brooks, MD;  Location: [facility] OR;  Service: Orthopedics;  Laterality: Right;  .  REVISION OF SCAR TISSUE RECTUS MUSCLE    . SMALL BOWEL REPAIR    . SPINAL CORD STIMULATOR BATTERY EXCHANGE N/A 10/17/2012   Procedure: SPINAL CORD STIMULATOR BATTERY REMOVAL;  Surgeon: Venita Lickahari Brooks, MD;  Location: [facility] OR;  Service: Orthopedics;  Laterality: N/A;  . SPINAL CORD STIMULATOR BATTERY EXCHANGE N/A 07/22/2015   Procedure: REIMPLANTATION OF SPINAL CORD STIMULATOR BATTERY ;  Surgeon: Venita Lickahari Brooks, MD;  Location: [facility] OR;  Service: Orthopedics;  Laterality: N/A;  . TOTAL KNEE ARTHROPLASTY Left   . TOTAL KNEE ARTHROPLASTY Right 07/28/2017   Procedure: RIGHT TOTAL KNEE ARTHROPLASTY;  Surgeon: Eugenia Mcalpineollins, Robert, MD;  Location: [facility] ORS;  Service: Orthopedics;  Laterality: Right;   Family History  Problem Relation Age of Onset  . Heart attack Mother   . Sarcoidosis Sister    Social History   Tobacco Use  . Smoking status: Former Smoker    Types: Cigarettes    Last attempt to quit: 2006    Years since quitting: 13.0  . Smokeless tobacco: Never Used  . Tobacco comment: quit smoking 2449yrs ago  Substance Use Topics  . Alcohol use: No  . Drug use: No    Interim medical history since last visit reviewed. Allergies and medications reviewed  Review of Systems Per HPI unless specifically indicated above     Objective:    BP 118/76   Pulse 77   Temp 97.6 F (36.4 C) (Oral)   Resp 14   Wt 166 lb 8 oz (75.5 kg)   SpO2 93%   BMI 29.49 kg/m   Wt Readings from Last 3 Encounters:  09/22/17 166 lb 8 oz (75.5 kg)  09/11/17 165 lb 6.4 oz (75 kg)  07/28/17 173 lb (78.5 kg)    Physical Exam  Constitutional: She appears well-developed and well-nourished. No distress.  HENT:  Head: Normocephalic and atraumatic.  Eyes: EOM are normal. No scleral icterus.  Neck: No thyromegaly present.  Cardiovascular: Normal rate, regular rhythm and normal heart sounds.  No murmur heard. Pulmonary/Chest: Effort normal and breath sounds normal. She has no wheezes.  Abdominal: Soft. Bowel sounds are  normal. She exhibits no distension.  Musculoskeletal: She exhibits no edema.  Neurological: She is alert.  Skin: Skin is warm and dry. She is not diaphoretic. No pallor.  Psychiatric: She has a normal mood and affect. Her behavior is normal.   Diabetic Foot Form - Detailed   Diabetic Foot Exam - detailed Diabetic Foot exam was performed with the following findings:  Yes 09/22/2017  8:44 AM  Visual Foot Exam completed.:  Yes  Pulse Foot Exam completed.:  Yes  Right Dorsalis Pedis:  Present Left  Dorsalis Pedis:  Present  Sensory Foot Exam Completed.:  Yes Semmes-Weinstein Monofilament Test R Site 1-Great Toe:  Pos L Site 1-Great Toe:  Pos        Results for orders placed or performed in visit on 09/11/17  CBC w/Diff/Platelet  Result Value Ref Range   WBC 3.8 3.8 - 10.8 Thousand/uL   RBC 4.27 3.80 - 5.10 Million/uL   Hemoglobin 10.7 (L) 11.7 - 15.5 g/dL   HCT 16.1 (L) 09.6 - 04.5 %   MCV 76.6 (L) 80.0 - 100.0 fL   MCH 25.1 (L) 27.0 - 33.0 pg   MCHC 32.7 32.0 - 36.0 g/dL   RDW 40.9 81.1 - 91.4 %   Platelets 375 140 - 400 Thousand/uL   MPV 10.9 7.5 - 12.5 fL   Neutro Abs 2,360 1,500 - 7,800 cells/uL   Lymphs Abs 977 850 - 3,900 cells/uL   WBC mixed population 281 200 - 950 cells/uL   Eosinophils Absolute 152 15 - 500 cells/uL   Basophils Absolute 30 0 - 200 cells/uL   Neutrophils Relative % 62.1 %   Total Lymphocyte 25.7 %   Monocytes Relative 7.4 %   Eosinophils Relative 4.0 %   Basophils Relative 0.8 %  COMPLETE METABOLIC PANEL WITH GFR  Result Value Ref Range   Glucose, Bld 85 65 - 99 mg/dL   BUN 18 7 - 25 mg/dL   Creat 7.82 9.56 - 2.13 mg/dL   GFR, Est Non African American 91 > OR = 60 mL/min/1.56m2   GFR, Est African American 106 > OR = 60 mL/min/1.65m2   BUN/Creatinine Ratio NOT APPLICABLE 6 - 22 (calc)   Sodium 143 135 - 146 mmol/L   Potassium 3.6 3.5 - 5.3 mmol/L   Chloride 107 98 - 110 mmol/L   CO2 27 20 - 32 mmol/L   Calcium 9.6 8.6 - 10.4 mg/dL   Total  Protein 7.0 6.1 - 8.1 g/dL   Albumin 4.1 3.6 - 5.1 g/dL   Globulin 2.9 1.9 - 3.7 g/dL (calc)   AG Ratio 1.4 1.0 - 2.5 (calc)   Total Bilirubin 0.3 0.2 - 1.2 mg/dL   Alkaline phosphatase (APISO) 88 33 - 130 U/L   AST 16 10 - 35 U/L   ALT 12 6 - 29 U/L  TSH  Result Value Ref Range   TSH 1.08 0.40 - 4.50 mIU/L  VITAMIN D 25 Hydroxy (Vit-D Deficiency, Fractures)  Result Value Ref Range   Vit D, 25-Hydroxy 25 (L) 30 - 100 ng/mL  Vitamin B12  Result Value Ref Range   Vitamin B-12 896 200 - 1,100 pg/mL  Iron, TIBC and Ferritin Panel  Result Value Ref Range   Iron 68 45 - 160 mcg/dL   TIBC 086 578 - 469 mcg/dL (calc)   %SAT 22 11 - 50 % (calc)   Ferritin 81 20 - 288 ng/mL  TEST AUTHORIZATION  Result Value Ref Range   TEST NAME: IRON, TIBC AND FERRITIN PANEL    TEST CODE: [id number]    CLIENT CONTACT: [place] WRAY    REPORT ALWAYS MESSAGE SIGNATURE        Assessment & Plan:   Problem List Items Addressed This Visit      Endocrine   Diabetes mellitus type 2, uncontrolled, without complications (HCC)    Stopped ACE-I because of cough; BP right now is excellent; will monitor and consider ARB in future for kidney protection      Relevant Medications   atorvastatin (LIPITOR)  40 MG tablet     Other   Vitamin D deficiency    Take 2,000 iu vit D3 daily for 3 months, then cut back to 1000 iu daily      Relevant Medications   Vitamin D, Cholecalciferol, 1000 units TABS   S/P knee replacement    Having pain, so encouraged her to call her orthopaedist      Hypercholesterolemia    Check in one month, just had labs done and will not repeat today; avoid saturated fats; continue statin      Relevant Medications   atorvastatin (LIPITOR) 40 MG tablet   Anemia    Normal iron studies; may have been post-operative; recheck in one month; colonoscopy UTD per patient          Follow up plan: Return in about 1 month (around 10/20/2017) for labs only (fasting); 4 months with Dr.  Sherie Don.  An after-visit summary was printed and given to the patient at check-out.  Please see the patient instructions which may contain other information and recommendations beyond what is mentioned above in the assessment and plan.  Meds ordered this encounter  Medications  . glucose blood (ACCU-CHEK SMARTVIEW) test strip    Sig: NANO -- check fingerstick blood sugars 3 x a day; LON 99 months, Dx E11.9    Dispense:  300 each    Refill:  3  . atorvastatin (LIPITOR) 40 MG tablet    Sig: Take 1 tablet (40 mg total) by mouth at bedtime.    Dispense:  90 tablet    Refill:  1  . albuterol (PROVENTIL HFA;VENTOLIN HFA) 108 (90 Base) MCG/ACT inhaler    Sig: Inhale 2 puffs into the lungs every 4 (four) hours as needed (for wheezing and shortness of breath from seasonal allergies). PROVENTIL    Dispense:  3 Inhaler    Refill:  0    PROVENTIL    No orders of the defined types were placed in this encounter.

## 2017-09-22 NOTE — Assessment & Plan Note (Signed)
Take 2,000 iu vit D3 daily for 3 months, then cut back to 1000 iu daily

## 2017-09-26 ENCOUNTER — Other Ambulatory Visit: Payer: Self-pay | Admitting: Pharmacist

## 2017-09-26 ENCOUNTER — Ambulatory Visit: Payer: Self-pay | Admitting: Pharmacist

## 2017-09-26 NOTE — Patient Outreach (Signed)
[facility] Care Management  09/26/2017  Ward ChattersMary H Canino 1946-08-10 [id number]  Unsuccessful telephone call to Ms. Karch today.  No voicemail available to leave a message.     Plan: I will follow-up with [name] later this week regarding medications.   Haynes Hoehnolleen Javonne Louissaint, PharmD, [facility] Clinical Pharmacist [facility] [phone]

## 2017-09-27 NOTE — Addendum Note (Signed)
Addended by: Frankey ShownGLANTON, Sennie Borden C on: 09/27/2017 01:46 PM   Modules accepted: Orders

## 2017-09-27 NOTE — Telephone Encounter (Signed)
Pharm called - they need to have an approval for accu check aviva. That is what the pt insurance is covering for this year.  Meter, lancets, strips, control solution, swabs Cb# [phone]

## 2017-09-27 NOTE — Telephone Encounter (Signed)
Per pt's insurance she must have accu chek aviva not nano. Discontiued that RX please send new one below. Thanks!

## 2017-09-28 ENCOUNTER — Telehealth: Payer: Self-pay | Admitting: Pharmacist

## 2017-09-28 MED ORDER — ACCU-CHEK AVIVA CONNECT W/DEVICE KIT: 1.0000 | PACK | 0 refills | Status: DC

## 2017-09-28 NOTE — Patient Outreach (Signed)
[facility] Care Management  09/28/2017  Lori Potts 1945-09-27 [id number]  Successful telephone call with [name] today.  HIPAA identifiers verified.   Proventil PAP:  [name] reports she received the application for Proventil and has completed her paperwork.  She is planning on bring this to Dr. Marlise Potts's office today and Dr. Sherie Potts will mail the patient and provider portion to [employer].     [employer] mail order: Patient reports she has not [facility]  She is aware that she can call [employer] for tracking information and reports she will call later today.    Plan: I will follow-up with [name] next week regarding [employer] mail order.   Lori Potts, PharmD, [facility] Clinical Pharmacist [facility] [phone]

## 2017-09-28 NOTE — Addendum Note (Signed)
Addended by: Ancil Dewan, Janit BernMELINDA P on: 09/28/2017 10:06 AM   Modules accepted: Orders

## 2017-09-29 ENCOUNTER — Ambulatory Visit: Payer: Self-pay | Admitting: Pharmacist

## 2017-09-29 ENCOUNTER — Other Ambulatory Visit: Payer: Self-pay | Admitting: Pharmacy Technician

## 2017-09-29 NOTE — Patient Outreach (Addendum)
[facility]N) Care Management  09/29/2017  Ward ChattersMary H Fenech 09/22/1945 [id number]   Unsuccessful Outreach call #1 to patient in reference to [employer] patient assistance application. No voicemail, line just continuously rings.  Upon review of patient encounters, patient was successfully contacted on 02/07 by Pharmacist Colleen Summe. Colleens note states that patient will have provider mail in patient and provider portion on application together.  Suzan SlickAshley N. Ernesta Ambleoleman, CPhT [facility] Care Management [phone]

## 2017-10-02 ENCOUNTER — Other Ambulatory Visit: Payer: Self-pay | Admitting: Family Medicine

## 2017-10-02 ENCOUNTER — Ambulatory Visit: Payer: Medicare HMO | Admitting: Family Medicine

## 2017-10-04 ENCOUNTER — Ambulatory Visit: Payer: Self-pay | Admitting: Pharmacist

## 2017-10-04 ENCOUNTER — Other Ambulatory Visit: Payer: Self-pay | Admitting: Pharmacist

## 2017-10-04 NOTE — Patient Outreach (Addendum)
[facility]N) Care Management  10/04/2017  Lori Potts June 11, 1946 [id number]  Successful telephone call to [name] today.  HIPAA identifiers verified. [name] reports she has not received the [facility] catalogue or any medication yet via mail order.  She reports a Nature conservation officerHumana representative told her a medication would cost $90 which she cannot afford.    3 way call placed to [facility].   1.  Ventolin medication on file for $90 which patient does not need currently as we are applying for albuterol inhaler PAP through [employer].   2.  Patient does have a prescription for atorvastatin on file which is too soon to fill based on patient picking this up recently from local pharmacy.  When prescription is due, [employer] will alert Lori Potts.  Glucometer called in by Dr. Sherie DonLada is Accu-check Aviva Connect which is covered but the test strips prescribed are not covered .   3.  Omeprazole will be $0 copay if patient to continue on this therapy.  Per Dr. Marlise EvesLada's notes, patient needs to follow-up with GI physician to clarify for recommendations on therapy.  Patient voiced understanding.   4.  Patient ordered several OTC medications through [facility] pharmacy catalogue program including Women's MVI, melatonin, Vitamin B, Vitamin D, and aspirin.  This will be shipped to patient in 10-14 days.  Patient aware she can order these every quarter.  5.  Another OTC catalogue will be mailed to patient.    Plan: [facility] pharmacy will continue to follow for medication assistance with albuterol inhaler PAP application.  Per patient, she dropped off application yesterday to Dr. Marlise EvesLada's office.  [facility] will follow-up with [name] with updates on application status.   I will reach out to Dr. Sherie DonLada to request new test strips, Accu-Check Aviva Plus, be called into [facility] for patient.    Haynes Hoehnolleen Maryna Yeagle, PharmD, [facility] Clinical Pharmacist [facility] [phone]

## 2017-10-06 ENCOUNTER — Telehealth: Payer: Self-pay | Admitting: Family Medicine

## 2017-10-06 DIAGNOSIS — M5416 Radiculopathy, lumbar region: Secondary | ICD-10-CM | POA: Diagnosis not present

## 2017-10-06 DIAGNOSIS — Z79891 Long term (current) use of opiate analgesic: Secondary | ICD-10-CM | POA: Diagnosis not present

## 2017-10-06 DIAGNOSIS — G894 Chronic pain syndrome: Secondary | ICD-10-CM | POA: Diagnosis not present

## 2017-10-06 MED ORDER — GLUCOSE BLOOD VI STRP: ORAL_STRIP | 3 refills | Status: DC

## 2017-10-06 NOTE — Telephone Encounter (Signed)
[facility]

## 2017-10-06 NOTE — Telephone Encounter (Signed)
-----   Message from Wynonia HazardColleen E Summe, [facility] sent at 10/06/2017  8:55 AM EST ----- Regarding: Test Strips Hi Dr. Sherie DonLada,  Thank you so much for calling in the Accu-Check Aviva Connect glucometer for Lori Potts.  Can you also please send in a new prescription for the Accu-Check Aviva Plus test strips [facility]?  The other test strips called in were not covered.    Thanks so much! Estée LauderColleen

## 2017-10-09 ENCOUNTER — Other Ambulatory Visit: Payer: Self-pay | Admitting: *Deleted

## 2017-10-09 NOTE — Patient Outreach (Signed)
[facility]) Care Management  10/09/2017   Lori Potts [birth year] [id number]  RN Health Coach telephone call to patient.  Hipaa compliance verified. Per patient she is doing very good. Patient fasting blood sugar is 89. Her A1C is 5.9 Patient weight is 166 lbs. Per patient this is where she wants to be with her weight and A1C. Patient is aware that case will be closed today with the Health Coach. Current Medications:  Current Outpatient Medications  Medication Sig Dispense Refill  . albuterol (PROVENTIL HFA;VENTOLIN HFA) 108 (90 Base) MCG/ACT inhaler Inhale 2 puffs into the lungs every 4 (four) hours as needed (for wheezing and shortness of breath from seasonal allergies). PROVENTIL 3 Inhaler 0  . albuterol (PROVENTIL) (2.5 MG/3ML) 0.083% nebulizer solution Take 2.5 mg by nebulization every 6 (six) hours as needed for wheezing or shortness of breath.     Marland Kitchen aspirin EC 81 MG tablet Take 81 mg by mouth daily.    Marland Kitchen atorvastatin (LIPITOR) 40 MG tablet Take 1 tablet (40 mg total) by mouth at bedtime. 90 tablet 1  . baclofen (LIORESAL) 10 MG tablet Take 1 tablet (10 mg total) by mouth 3 (three) times daily as needed for muscle spasms. 50 tablet 1  . Blood Glucose Calibration (ACCU-CHEK SMARTVIEW CONTROL) LIQD For use with glucometer; Dx: E11.9; LON 99 months; check sugars once daily 1 each 3  . Blood Glucose Monitoring Suppl (ACCU-CHEK AVIVA CONNECT) w/Device KIT 1 each by Does not apply route as directed. Use to check fingerstick blood sugars once a day; LON 99 months; E11.9 1 kit 0  . glucose blood (ACCU-CHEK AVIVA) test strip Check fingerstick blood sugars 3 x a day; LON 99 months, Dx E11.9 300 each 3  . Insulin Pen Needle (BD ULTRA-FINE PEN NEEDLES) 29G X 12.7MM MISC 1 pen by Does not apply route once. 100 each 3  . ipratropium-albuterol (DUONEB) 0.5-2.5 (3) MG/3ML SOLN TAKE 3 MLS BY NEBULIZATION EVERY 4 (FOUR) HOURS AS NEEDED. (Patient taking differently: Take 3 mLs by  nebulization every 4 (four) hours as needed (for shortness of breath or wheezing). ) 360 mL 3  . levocetirizine (XYZAL) 5 MG tablet Take 1 tablet (5 mg total) by mouth daily as needed for allergies. 90 tablet 3  . liraglutide (VICTOZA) 18 MG/3ML SOPN Inject 0.6 mg daily subcutaneously; this REPLACES Xultophy -- do not use both 5 pen 2  . mometasone (NASONEX) 50 MCG/ACT nasal spray Place 2 sprays into the nose daily. (Patient taking differently: Place 2 sprays into the nose daily as needed (for allergies). ) 17 g 0  . montelukast (SINGULAIR) 10 MG tablet Take 1 tablet (10 mg total) by mouth at bedtime. 30 tablet 3  . omeprazole (PRILOSEC) 20 MG capsule Take 1 capsule (20 mg total) by mouth 2 (two) times daily before a meal.    . oxyCODONE-acetaminophen (PERCOCET) 10-325 MG tablet Take 1 tablet by mouth every 4 (four) hours as needed for pain.    . Vitamin D, Cholecalciferol, 1000 units TABS Take 2,000 Units by mouth daily.     No current facility-administered medications for this visit.     Functional Status:  In your present state of health, do you have any difficulty performing the following activities: 10/09/2017 09/22/2017  Hearing? N N  Vision? N N  Difficulty concentrating or making decisions? N N  Walking or climbing stairs? Y N  Dressing or bathing? N N  Doing errands, shopping? N N  Conservation officer, nature and  eating ? N -  Using the Toilet? N -  In the past six months, have you accidently leaked urine? Y -  Do you have problems with loss of bowel control? N -  Managing your Medications? Y -  Managing your Finances? N -  Housekeeping or managing your Housekeeping? Y -  Some recent data might be hidden    Fall/Depression Screening: Fall Risk  10/09/2017 09/22/2017 09/07/2017  Falls in the past year? No No No  Number falls in past yr: - - -  Injury with Fall? No - -  Risk for fall due to : - - Impaired balance/gait;Impaired mobility;History of fall(s)  Follow up - - -   PHQ 2/9 Scores  10/09/2017 09/22/2017 09/07/2017 06/28/2017 06/12/2017 05/26/2017 04/13/2017  PHQ - 2 Score 0 0 1 1 0 0 0    Assessment:  A1C is 5.9 Fasting blood sugar is 89 Patient has met her goal  Plan: RNsent closure letter to physician  RN sent closure letter to patient RN is closing case RN will make CMA aware of case closure  [place] Management [phone]

## 2017-10-10 ENCOUNTER — Encounter: Payer: Self-pay | Admitting: Family Medicine

## 2017-10-10 ENCOUNTER — Ambulatory Visit (INDEPENDENT_AMBULATORY_CARE_PROVIDER_SITE_OTHER): Payer: Medicare HMO | Admitting: Family Medicine

## 2017-10-10 ENCOUNTER — Ambulatory Visit
Admission: RE | Admit: 2017-10-10 | Discharge: 2017-10-10 | Disposition: A | Discharge status: Home / Self Care | Payer: Medicare HMO | Source: Ambulatory Visit | Attending: Family Medicine | Admitting: Family Medicine

## 2017-10-10 ENCOUNTER — Other Ambulatory Visit: Payer: Self-pay | Admitting: Family Medicine

## 2017-10-10 VITALS — BP 132/74 | HR 94 | Temp 98.2°F | Resp 16 | Ht 66.0 in | Wt 162.5 lb

## 2017-10-10 DIAGNOSIS — K5730 Diverticulosis of large intestine without perforation or abscess without bleeding: Secondary | ICD-10-CM | POA: Insufficient documentation

## 2017-10-10 DIAGNOSIS — R300 Dysuria: Secondary | ICD-10-CM | POA: Diagnosis not present

## 2017-10-10 DIAGNOSIS — R1032 Left lower quadrant pain: Secondary | ICD-10-CM

## 2017-10-10 DIAGNOSIS — N281 Cyst of kidney, acquired: Secondary | ICD-10-CM | POA: Insufficient documentation

## 2017-10-10 DIAGNOSIS — Z87442 Personal history of urinary calculi: Secondary | ICD-10-CM | POA: Diagnosis not present

## 2017-10-10 DIAGNOSIS — R109 Unspecified abdominal pain: Secondary | ICD-10-CM | POA: Diagnosis not present

## 2017-10-10 LAB — POCT URINALYSIS DIPSTICK
Bilirubin, UA: NEGATIVE
Glucose, UA: NEGATIVE
Ketones, UA: NEGATIVE
Leukocytes, UA: NEGATIVE
Nitrite, UA: NEGATIVE
Protein, UA: NEGATIVE
Spec Grav, UA: 1.02 (ref 1.010–1.025)
Urobilinogen, UA: NEGATIVE E.U./dL — AB
pH, UA: 6 (ref 5.0–8.0)

## 2017-10-10 MED ORDER — TAMSULOSIN HCL 0.4 MG PO CAPS: 0.4000 mg | ORAL_CAPSULE | Freq: Every day | ORAL | 0 refills | Status: DC

## 2017-10-10 NOTE — Progress Notes (Signed)
Name: Lori Potts   MRN: [id number]    DOB: Nov 04, 1945   Date:10/10/2017       Progress Note  Subjective  Chief Complaint  Chief Complaint  Patient presents with  . Urinary Tract Infection    pain when urinating, seen blood    HPI  Pt presents with concern for UTI for about a week.  She notes one "drop of blood" in the urine 09/30/2017, has not seen any since.  Has history of kidney stones.  Endorses LEFT flank pain, dysuria, urinary urgency and frequency.  Denies NVD, abdominal pain, fevers/chills.  Patient Active Problem List   Diagnosis Date Noted  . Anemia 09/22/2017  . S/P knee replacement 07/28/2017  . Allergic rhinitis 05/26/2017  . Vitamin D deficiency 05/19/2017  . Chronic nonintractable headache 05/18/2017  . Special screening for malignant neoplasms, colon   . Gastroesophageal reflux disease   . Gastritis without bleeding   . 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 Insomnia 11/29/2016  . 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 Vasomotor flushing 04/19/2016  . Abdominal wall pain in right flank 03/29/2016  . Diverticulosis 02/24/2016  . Hypercholesterolemia 06/16/2015  . FHx: migraine headaches 04/13/2015  . Chronic pain 04/13/2015  . Neck pain 02/26/2015  . Status post lumbar surgery 02/26/2015  . Abnormal ECG 01/27/2015  . Angina pectoris ([place]) 01/27/2015  . Chest pain 01/27/2015  . Diabetes mellitus type 2, uncontrolled, without complications ([place]) 40/98/1191  . BP (high blood pressure) 01/27/2015    Social History   Tobacco Use  . Smoking status: Former Smoker    Types: Cigarettes    Last attempt to quit: 2006    Years since quitting: 13.1  . Smokeless tobacco: Never Used  . Tobacco comment: quit smoking 24yr ago  Substance Use Topics  . Alcohol use: No     Current Outpatient Medications:  .  albuterol (PROVENTIL HFA;VENTOLIN HFA) 108 (90 Base) MCG/ACT inhaler, Inhale 2 puffs into the lungs every 4 (four)  hours as needed (for wheezing and shortness of breath from seasonal allergies). PROVENTIL, Disp: 3 Inhaler, Rfl: 0 .  albuterol (PROVENTIL) (2.5 MG/3ML) 0.083% nebulizer solution, Take 2.5 mg by nebulization every 6 (six) hours as needed for wheezing or shortness of breath. , Disp: , Rfl:  .  aspirin EC 81 MG tablet, Take 81 mg by mouth daily., Disp: , Rfl:  .  atorvastatin (LIPITOR) 40 MG tablet, Take 1 tablet (40 mg total) by mouth at bedtime., Disp: 90 tablet, Rfl: 1 .  baclofen (LIORESAL) 10 MG tablet, Take 1 tablet (10 mg total) by mouth 3 (three) times daily as needed for muscle spasms., Disp: 50 tablet, Rfl: 1 .  Blood Glucose Calibration (ACCU-CHEK SMARTVIEW CONTROL) LIQD, For use with glucometer; Dx: E11.9; LON 99 months; check sugars once daily, Disp: 1 each, Rfl: 3 .  Blood Glucose Monitoring Suppl (ACCU-CHEK AVIVA CONNECT) w/Device KIT, 1 each by Does not apply route as directed. Use to check fingerstick blood sugars once a day; LON 99 months; E11.9, Disp: 1 kit, Rfl: 0 .  glucose blood (ACCU-CHEK AVIVA) test strip, Check fingerstick blood sugars 3 x a day; LON 99 months, Dx E11.9, Disp: 300 each, Rfl: 3 .  Insulin Pen Needle (BD ULTRA-FINE PEN NEEDLES) 29G X 12.7MM MISC, 1 pen by Does not apply route once., Disp: 100 each, Rfl: 3 .  ipratropium-albuterol (DUONEB) 0.5-2.5 (3) MG/3ML SOLN, TAKE 3 MLS BY NEBULIZATION EVERY 4 (FOUR) HOURS AS  NEEDED. (Patient taking differently: Take 3 mLs by nebulization every 4 (four) hours as needed (for shortness of breath or wheezing). ), Disp: 360 mL, Rfl: 3 .  levocetirizine (XYZAL) 5 MG tablet, Take 1 tablet (5 mg total) by mouth daily as needed for allergies., Disp: 90 tablet, Rfl: 3 .  liraglutide (VICTOZA) 18 MG/3ML SOPN, Inject 0.6 mg daily subcutaneously; this REPLACES Xultophy -- do not use both, Disp: 5 pen, Rfl: 2 .  mometasone (NASONEX) 50 MCG/ACT nasal spray, Place 2 sprays into the nose daily. (Patient taking differently: Place 2 sprays into  the nose daily as needed (for allergies). ), Disp: 17 g, Rfl: 0 .  montelukast (SINGULAIR) 10 MG tablet, Take 1 tablet (10 mg total) by mouth at bedtime., Disp: 30 tablet, Rfl: 3 .  omeprazole (PRILOSEC) 20 MG capsule, Take 1 capsule (20 mg total) by mouth 2 (two) times daily before a meal., Disp: , Rfl:  .  oxyCODONE-acetaminophen (PERCOCET) 10-325 MG tablet, Take 1 tablet by mouth every 4 (four) hours as needed for pain., Disp: , Rfl:  .  Vitamin D, Cholecalciferol, 1000 units TABS, Take 2,000 Units by mouth daily., Disp: , Rfl:   Allergies  Allergen Reactions  . Adhesive [Tape] Rash and Other (See Comments)    Regular tape is ok, allergy is to paper tape    ROS  Constitutional: Negative for fever or weight change.  Respiratory: Negative for cough and shortness of breath.   Cardiovascular: Negative for chest pain or palpitations.  Gastrointestinal:See HPI Musculoskeletal: Negative for gait problem or joint swelling.  Skin: Negative for rash.  Neurological: Negative for dizziness or headache.  No other specific complaints in a complete review of systems (except as listed in HPI above).  Objective  Vitals:   10/10/17 0805  BP: 132/74  Pulse: 94  Resp: 16  Temp: 98.2 F (36.8 C)  TempSrc: Oral  SpO2: 95%  Weight: 162 lb 8 oz (73.7 kg)  Height: '5\' 6"'$  (1.676 m)   Body mass index is 26.23 kg/m.  Nursing Note and Vital Signs reviewed.  Physical Exam  Constitutional: Patient appears well-developed and well-nourished. Obese. No distress.  HEENT: head atraumatic, normocephalic Cardiovascular: Normal rate, regular rhythm, S1/S2 present.  No murmur or rub heard. No BLE edema. Pulmonary/Chest: Effort normal and breath sounds clear. No respiratory distress or retractions. Abdominal: Soft and non-tender, bowel sounds present x4 quadrants.  LEFT sided CVA Tenderness is present on examination.   Psychiatric: Patient has a normal mood and affect. behavior is normal. Judgment and  thought content normal.  Results for orders placed or performed in visit on 10/10/17 (from the past 72 hour(s))  POCT urinalysis dipstick     Status: Abnormal   Collection Time: 10/10/17  8:13 AM  Result Value Ref Range   Color, UA yellow    Clarity, UA clear    Glucose, UA negative    Bilirubin, UA negative    Ketones, UA negative    Spec Grav, UA 1.020 1.010 - 1.025   Blood, UA trace    pH, UA 6.0 5.0 - 8.0   Protein, UA negative    Urobilinogen, UA negative (A) 0.2 or 1.0 E.U./dL   Nitrite, UA negative    Leukocytes, UA Negative Negative   Appearance clear    Odor strong     Assessment & Plan  1. Left flank pain - POCT urinalysis dipstick - CT RENAL STONE STUDY; Future - Urine Culture - Urinalysis, microscopic only  2.  History of nephrolithiasis - CT RENAL STONE STUDY; Future - Urine Culture - Urinalysis, microscopic only  3. Dysuria - CT RENAL STONE STUDY; Future - Urine Culture - Urinalysis, microscopic only  4. Left lower quadrant pain - CT RENAL STONE STUDY; Future - Urine Culture - Urinalysis, microscopic only  - We will hold off on treating with antibiotics for now as UA is grossly normal aside from trace blood.  With patient's history of nephrolithiasis we will scan for stone and proceed with treatment based on results of stone study.  -Red flags and when to present for emergency care or RTC including fever >101.65F, chest pain, shortness of breath, new/worsening/un-resolving symptoms, severe flank/abdominal pain, vomiting, frank hematuria, reviewed with patient at time of visit. Follow up and care instructions discussed and provided in AVS.

## 2017-10-11 LAB — URINE CULTURE
MICRO NUMBER:: 90218251
SPECIMEN QUALITY:: ADEQUATE

## 2017-10-11 LAB — URINALYSIS, MICROSCOPIC ONLY
Bacteria, UA: NONE SEEN /HPF
Hyaline Cast: NONE SEEN /LPF

## 2017-10-12 ENCOUNTER — Encounter: Payer: Self-pay | Admitting: Emergency Medicine

## 2017-10-12 NOTE — Progress Notes (Unsigned)
p 

## 2017-10-13 DIAGNOSIS — Z471 Aftercare following joint replacement surgery: Secondary | ICD-10-CM | POA: Diagnosis not present

## 2017-10-13 DIAGNOSIS — Z96651 Presence of right artificial knee joint: Secondary | ICD-10-CM | POA: Diagnosis not present

## 2017-10-16 ENCOUNTER — Other Ambulatory Visit: Payer: Self-pay | Admitting: Specialist

## 2017-10-16 DIAGNOSIS — Z471 Aftercare following joint replacement surgery: Secondary | ICD-10-CM

## 2017-10-16 DIAGNOSIS — Z96651 Presence of right artificial knee joint: Secondary | ICD-10-CM

## 2017-10-18 ENCOUNTER — Other Ambulatory Visit: Payer: Self-pay | Admitting: Pharmacy Technician

## 2017-10-18 NOTE — Patient Outreach (Signed)
[facility] ([facility]) Care Management  10/18/2017  Ward ChattersMary H Venables [facility]20/1947 [id number]   Sent In Basket message to CMA [place] in regard patient assistance application.  Suzan SlickAshley N. Ernesta Ambleoleman, CPhT [facility] Care Management [phone]

## 2017-10-24 ENCOUNTER — Other Ambulatory Visit: Payer: Self-pay | Admitting: Pharmacy Technician

## 2017-10-24 ENCOUNTER — Telehealth: Payer: Self-pay | Admitting: Family Medicine

## 2017-10-24 NOTE — Patient Outreach (Signed)
[facility]) Care Management  10/24/2017  Lori Potts 1945-11-23 [id number]  Contacted Dr. Marlise EvesLada's office in reference to [employer] patient assistance application. Spoke to [place] whom stated there are no notes in patients profile that states patient dropped off patient portion nor that provider has mailed application out. Left a message with Rosey Batheresa to have someone call me back to discuss.  Suzan SlickAshley N. Ernesta Ambleoleman, CPhT [facility] Care Management [phone]

## 2017-10-24 NOTE — Telephone Encounter (Signed)
Copied from CRM [id number]. Topic: Quick Communication - See Telephone Encounter >> Oct 24, 2017 11:37 AM Louie BunPalacios Medina, Rosey Batheresa D wrote: CRM for notification. See Telephone encounter for: 10/24/17. Morrie Sheldonshley with [facility] called form regarding a form that patient left last month. The form is called [employer] Patient Chief of StaffAssistance Application and was suppose to be return back to them. She can be reached at [phone], please call Morrie Sheldonshley back, thanks.

## 2017-10-24 NOTE — Patient Outreach (Signed)
[facility]) Care Management  10/24/2017  Lori Potts 12/19/1945 [id number]   Incoming call from Lori Potts at Dr. Marlise EvesLada's office. Lori Potts stated they received patient portion of [employer] application however, patient had not filled out the form. They have since contacted patient to have her come back in to fill it out. Lori Potts stated she would follow up with patient.  Lori Potts, CPhT [facility] Care Management [phone]

## 2017-10-25 ENCOUNTER — Other Ambulatory Visit: Payer: Self-pay

## 2017-10-25 ENCOUNTER — Other Ambulatory Visit: Payer: Self-pay | Admitting: Pharmacy Technician

## 2017-10-25 NOTE — Patient Outreach (Signed)
[facility]) Care Management  10/25/2017  Lori ChattersMary H Potts February 26, 1946 [id number]  Received call CMA Lori Potts at Lori Potts's office stating the provider portion of [employer] patient assistance application was misplaced in office. Sent in-basket requesting Lori Potts print off page 2 of [employer] application and have Lori Potts fill out and mail back in.  Lori SlickAshley N. Ernesta Potts, CPhT [facility] Care Management [phone]

## 2017-10-25 NOTE — Telephone Encounter (Signed)
Left voicemail with Morrie Sheldonashley that we would need another copy it has been misplaced, also still waiting for mrs. Celmer to put info of income on form before Dr. Sherie DonLada will sign

## 2017-10-26 MED ORDER — TRUE METRIX AIR GLUCOSE METER DEVI: 1.0000 | 0 refills | Status: AC

## 2017-10-26 MED ORDER — TRUE METRIX LEVEL 1 LOW VI SOLN: 1 refills | Status: DC

## 2017-10-26 MED ORDER — GLUCOSE BLOOD VI STRP: ORAL_STRIP | 12 refills | Status: DC

## 2017-10-26 MED ORDER — FREESTYLE LANCETS MISC: 12 refills | Status: DC

## 2017-10-26 NOTE — Telephone Encounter (Signed)
Please update the refill requests and send back to me They need to have the following information in them per Medicare rules: Frequency of testing Length of need Diagnosis code  Use to check fingerstick blood sugars once a day; LON 99 months; E11.9

## 2017-10-31 MED ORDER — FREESTYLE LANCETS MISC: 3 refills | Status: DC

## 2017-10-31 MED ORDER — GLUCOSE BLOOD VI STRP: ORAL_STRIP | 3 refills | Status: DC

## 2017-10-31 MED ORDER — INSULIN PEN NEEDLE 29G X 12.7MM MISC: 3 refills | Status: DC

## 2017-10-31 NOTE — Addendum Note (Signed)
Addended by: Tywon Niday, Janit BernMELINDA P on: 10/31/2017 04:57 PM   Modules accepted: Orders

## 2017-10-31 NOTE — Telephone Encounter (Signed)
[facility] you please [facility] to [facility]?

## 2017-10-31 NOTE — Addendum Note (Signed)
Addended by: Davene CostainGRAVES, Shawneen Deetz C on: 10/31/2017 04:25 PM   Modules accepted: Orders

## 2017-10-31 NOTE — Telephone Encounter (Signed)
Lori Potts is calling from [facility] to check on the status of these medication request. Please advise.  CB# [phone] Fax: [phone]

## 2017-11-02 ENCOUNTER — Other Ambulatory Visit: Payer: Self-pay | Admitting: Family Medicine

## 2017-11-02 DIAGNOSIS — M25551 Pain in right hip: Secondary | ICD-10-CM | POA: Diagnosis not present

## 2017-11-02 DIAGNOSIS — Z471 Aftercare following joint replacement surgery: Secondary | ICD-10-CM | POA: Diagnosis not present

## 2017-11-02 DIAGNOSIS — Z96651 Presence of right artificial knee joint: Secondary | ICD-10-CM | POA: Diagnosis not present

## 2017-11-02 MED ORDER — GLUCOSE BLOOD VI STRP: ORAL_STRIP | 3 refills | Status: DC

## 2017-11-02 MED ORDER — TRUE METRIX METER W/DEVICE KIT: PACK | 0 refills | Status: DC

## 2017-11-02 NOTE — Telephone Encounter (Signed)
[facility] for [facility]rix [facility]

## 2017-11-06 ENCOUNTER — Encounter
Admission: RE | Admit: 2017-11-06 | Discharge: 2017-11-06 | Disposition: A | Discharge status: Home / Self Care | Payer: Medicare HMO | Source: Ambulatory Visit | Attending: Specialist | Admitting: Specialist

## 2017-11-06 ENCOUNTER — Ambulatory Visit (INDEPENDENT_AMBULATORY_CARE_PROVIDER_SITE_OTHER): Payer: Medicare HMO | Admitting: Family Medicine

## 2017-11-06 ENCOUNTER — Encounter: Payer: Self-pay | Admitting: Family Medicine

## 2017-11-06 VITALS — BP 130/70 | HR 87 | Temp 97.7°F | Resp 16 | Ht 66.0 in | Wt 166.1 lb

## 2017-11-06 DIAGNOSIS — Z96652 Presence of left artificial knee joint: Secondary | ICD-10-CM | POA: Diagnosis not present

## 2017-11-06 DIAGNOSIS — J0100 Acute maxillary sinusitis, unspecified: Secondary | ICD-10-CM

## 2017-11-06 DIAGNOSIS — E1165 Type 2 diabetes mellitus with hyperglycemia: Secondary | ICD-10-CM

## 2017-11-06 DIAGNOSIS — Z471 Aftercare following joint replacement surgery: Secondary | ICD-10-CM | POA: Diagnosis not present

## 2017-11-06 DIAGNOSIS — Z96651 Presence of right artificial knee joint: Secondary | ICD-10-CM

## 2017-11-06 DIAGNOSIS — J309 Allergic rhinitis, unspecified: Secondary | ICD-10-CM | POA: Diagnosis not present

## 2017-11-06 DIAGNOSIS — J209 Acute bronchitis, unspecified: Secondary | ICD-10-CM

## 2017-11-06 DIAGNOSIS — IMO0001 Reserved for inherently not codable concepts without codable children: Secondary | ICD-10-CM

## 2017-11-06 MED ORDER — TECHNETIUM TC 99M MEDRONATE IV KIT
23.6600 | PACK | Freq: Once | INTRAVENOUS | Status: AC | PRN
  Administered 2017-11-06: 23.66 via INTRAVENOUS

## 2017-11-06 MED ORDER — LORATADINE 10 MG PO TABS: 10.0000 mg | ORAL_TABLET | Freq: Every day | ORAL | 2 refills | Status: DC

## 2017-11-06 MED ORDER — BENZONATATE 100 MG PO CAPS: 100.0000 mg | ORAL_CAPSULE | Freq: Three times a day (TID) | ORAL | 0 refills | Status: DC | PRN

## 2017-11-06 MED ORDER — DOXYCYCLINE HYCLATE 100 MG PO TABS: 100.0000 mg | ORAL_TABLET | Freq: Two times a day (BID) | ORAL | 0 refills | Status: AC

## 2017-11-06 NOTE — Progress Notes (Signed)
Name: Lori Potts   MRN: [id number]    DOB: 1946/01/22   Date:11/06/2017       Progress Note  Subjective  Chief Complaint  Chief Complaint  Patient presents with  . URI    cough, congested, runny nose    HPI  Pt presents with 1 day of dry raspy cough, nasal congestion with maxillary sinus pain, rhinorrhea, headache.  Denies chest pain or shortness of breath, no wheezing, fevers, or chills, no changes in appetite.  Taking Nasonex.  Has not used her breathing treatments or albuterol since onset of illness.  Allergic Rhinitis: Has not been taking Xyzal due to cost, requests new Rx today which we will provide.  She has been taking Nasonex.  Pt has DM - has been doing well - BG's have been averaging in the 120's.  We will avoid oral steroids for the time being for her bronchitis. A1C 07/24/17 was 5.9%.   Patient Active Problem List   Diagnosis Date Noted  . Anemia 09/22/2017  . S/P knee replacement 07/28/2017  . Allergic rhinitis 05/26/2017  . Vitamin D deficiency 05/19/2017  . Chronic nonintractable headache 05/18/2017  . Special screening for malignant neoplasms, colon   . Gastroesophageal reflux disease   . Gastritis without bleeding   . 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 Insomnia 11/29/2016  . 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 Vasomotor flushing 04/19/2016  . Abdominal wall pain in right flank 03/29/2016  . Diverticulosis 02/24/2016  . Hypercholesterolemia 06/16/2015  . FHx: migraine headaches 04/13/2015  . Chronic pain 04/13/2015  . Neck pain 02/26/2015  . Status post lumbar surgery 02/26/2015  . Abnormal ECG 01/27/2015  . Angina pectoris ([place]) 01/27/2015  . Chest pain 01/27/2015  . Diabetes mellitus type 2, uncontrolled, without complications ([place]) 69/48/5462  . BP (high blood pressure) 01/27/2015    Social History   Tobacco Use  . Smoking status: Former Smoker    Types: Cigarettes    Last attempt to quit: 2006     Years since quitting: 13.2  . Smokeless tobacco: Never Used  . Tobacco comment: quit smoking 27yr ago  Substance Use Topics  . Alcohol use: No     Current Outpatient Medications:  .  albuterol (PROVENTIL HFA;VENTOLIN HFA) 108 (90 Base) MCG/ACT inhaler, Inhale 2 puffs into the lungs every 4 (four) hours as needed (for wheezing and shortness of breath from seasonal allergies). PROVENTIL, Disp: 3 Inhaler, Rfl: 0 .  albuterol (PROVENTIL) (2.5 MG/3ML) 0.083% nebulizer solution, Take 2.5 mg by nebulization every 6 (six) hours as needed for wheezing or shortness of breath. , Disp: , Rfl:  .  aspirin EC 81 MG tablet, Take 81 mg by mouth daily., Disp: , Rfl:  .  atorvastatin (LIPITOR) 40 MG tablet, Take 1 tablet (40 mg total) by mouth at bedtime., Disp: 90 tablet, Rfl: 1 .  baclofen (LIORESAL) 10 MG tablet, Take 1 tablet (10 mg total) by mouth 3 (three) times daily as needed for muscle spasms., Disp: 50 tablet, Rfl: 1 .  Blood Glucose Calibration (TRUE METRIX LEVEL 1) Low SOLN, Use to check fingerstick blood sugars once a day; LON 99 months; E11.9, Disp: 1 each, Rfl: 1 .  Blood Glucose Monitoring Suppl (TRUE METRIX METER) w/Device KIT, Use to check fingerstick blood sugars once a day; LON 99 months; E11.9, Disp: 1 kit, Rfl: 0 .  glucose blood (ACCU-CHEK AVIVA) test strip, Check fingerstick blood sugars once a day; LON 99 months,  Dx E11.9, Disp: 100 each, Rfl: 3 .  glucose blood (TRUE METRIX BLOOD GLUCOSE TEST) test strip, Use to check fingerstick blood sugars once a day; LON 99 months; E11.9, Disp: 100 each, Rfl: 3 .  Insulin Pen Needle (BD ULTRA-FINE PEN NEEDLES) 29G X 12.7MM MISC, For use with injectable GLP-1 inhibitor, Victoza, once a day, Disp: 100 each, Rfl: 3 .  ipratropium-albuterol (DUONEB) 0.5-2.5 (3) MG/3ML SOLN, TAKE 3 MLS BY NEBULIZATION EVERY 4 (FOUR) HOURS AS NEEDED. (Patient taking differently: Take 3 mLs by nebulization every 4 (four) hours as needed (for shortness of breath or  wheezing). ), Disp: 360 mL, Rfl: 3 .  Lancets (FREESTYLE) lancets, Use to check fingerstick blood sugars once a day; LON 99 months; E11.9, Disp: 100 each, Rfl: 3 .  levocetirizine (XYZAL) 5 MG tablet, Take 1 tablet (5 mg total) by mouth daily as needed for allergies., Disp: 90 tablet, Rfl: 3 .  liraglutide (VICTOZA) 18 MG/3ML SOPN, Inject 0.6 mg daily subcutaneously; this REPLACES Xultophy -- do not use both, Disp: 5 pen, Rfl: 2 .  mometasone (NASONEX) 50 MCG/ACT nasal spray, Place 2 sprays into the nose daily. (Patient taking differently: Place 2 sprays into the nose daily as needed (for allergies). ), Disp: 17 g, Rfl: 0 .  montelukast (SINGULAIR) 10 MG tablet, Take 1 tablet (10 mg total) by mouth at bedtime., Disp: 30 tablet, Rfl: 3 .  omeprazole (PRILOSEC) 20 MG capsule, Take 1 capsule (20 mg total) by mouth 2 (two) times daily before a meal., Disp: , Rfl:  .  oxyCODONE-acetaminophen (PERCOCET) 10-325 MG tablet, Take 1 tablet by mouth every 4 (four) hours as needed for pain., Disp: , Rfl:  .  tamsulosin (FLOMAX) 0.4 MG CAPS capsule, Take 1 capsule (0.4 mg total) by mouth daily., Disp: 3 capsule, Rfl: 0 .  Vitamin D, Cholecalciferol, 1000 units TABS, Take 2,000 Units by mouth daily., Disp: , Rfl:   Allergies  Allergen Reactions  . Adhesive [Tape] Rash and Other (See Comments)    Regular tape is ok, allergy is to paper tape    ROS  Ten systems reviewed and is negative except as mentioned in HPI  Objective  Vitals:   11/06/17 1104  BP: 130/70  Pulse: 87  Resp: 16  Temp: 97.7 F (36.5 C)  TempSrc: Oral  SpO2: 98%  Weight: 166 lb 1.6 oz (75.3 kg)  Height: '5\' 6"'$  (1.676 m)   Body mass index is 26.81 kg/m.  Nursing Note and Vital Signs reviewed.  Physical Exam  Constitutional: Patient appears well-developed and well-nourished. Obese No distress.  HEENT: head atraumatic, normocephalic, pupils equal and reactive to light, EOM's intact, TM's without erythema or bulging, +bilatearl  maxillary sinus tenderness; no frontal sinus tenderness, neck supple without lymphadenopathy, oropharynx erythematous tonsils +1 and moist without exudate Cardiovascular: Normal rate, regular rhythm, S1/S2 present.  No murmur or rub heard. No BLE edema. Pulmonary/Chest: Effort normal and breath sounds clear. No respiratory distress or retractions.  Dry raspy cough present throughout examination, worse with deep inspiration. Psychiatric: Patient has a normal mood and affect. behavior is normal. Judgment and thought content normal.  No results found for this or any previous visit (from the past 72 hour(s)).  Assessment & Plan  1. Acute bronchitis, unspecified organism - doxycycline (VIBRA-TABS) 100 MG tablet; Take 1 tablet (100 mg total) by mouth 2 (two) times daily for 7 days.  Dispense: 14 tablet; Refill: 0 - benzonatate (TESSALON PERLES) 100 MG capsule; Take 1-2 capsules (100-200  mg total) by mouth 3 (three) times daily as needed.  Dispense: 30 capsule; Refill: 0 - Nebulizer treatments and/or Albuterol inhaler PRN - Duoneb as prescribed PRN if wheezing/shortness of breath  2. Acute non-recurrent maxillary sinusitis - doxycycline (VIBRA-TABS) 100 MG tablet; Take 1 tablet (100 mg total) by mouth 2 (two) times daily for 7 days.  Dispense: 14 tablet; Refill: 0  3. Allergic rhinitis, unspecified seasonality, unspecified trigger - Pt unable to afford Xyzal, will place on Claritin for the time being. - loratadine (CLARITIN) 10 MG tablet; Take 1 tablet (10 mg total) by mouth daily.  Dispense: 90 tablet; Refill: 2 - Continue Nasonex  4. Diabetes mellitus type 2, uncontrolled, without complications ([place]) - Discussed increased risk for complex infections due to DM, encouraged good compliance with medications, and discussed sick day management. - We will avoid oral corticosteroids for the time being to avoid hyperglycemia as she notes a history of this with steroids in the past.  Will consider if  bronchitis symptoms are not improving with medications.  -Red flags and when to present for emergency care or RTC including fever >101.13F, chest pain, shortness of breath, new/worsening/un-resolving symptoms, reviewed with patient at time of visit. Follow up and care instructions discussed and provided in AVS.

## 2017-11-07 ENCOUNTER — Other Ambulatory Visit: Payer: Self-pay | Admitting: Pharmacy Technician

## 2017-11-07 ENCOUNTER — Telehealth: Payer: Self-pay | Admitting: Emergency Medicine

## 2017-11-07 MED ORDER — TRUE METRIX LEVEL 1 LOW VI SOLN: 1 refills | Status: DC

## 2017-11-07 MED ORDER — GLUCOSE BLOOD VI STRP: ORAL_STRIP | 3 refills | Status: DC

## 2017-11-07 MED ORDER — FREESTYLE LANCETS MISC: 3 refills | Status: DC

## 2017-11-07 NOTE — Patient Outreach (Signed)
[facility] Care Management  11/07/2017  Lori Potts [birth year] [id number]  Received in-basket message from Marcos EkeJamie Graves that she has patients portion of [employer] application and that she would have Dr. Sherie DonLada fill out provider portion and mail it back to me.  Suzan SlickAshley N. Ernesta Ambleoleman, CPhT [facility] Care Management [phone]

## 2017-11-07 NOTE — Patient Outreach (Signed)
[facility] Care Management  11/07/2017  [name] 03-09-1946 [id number]   Successful outreach call to Mrs. Court in Education administratorreference to [name]. HIPAA identifiers verified.  [name] stated she went to Dr. Marlise EvesLada's office a couple weeks ago and filled out her portion of the application.  I will send in-basket to [facility]cos [facility]Jamie [facility] who was helping me with this process.  Will follow up with patient once answered is received about application.  Suzan SlickAshley N. Ernesta Ambleoleman, CPhT [facility] Care Management [phone]

## 2017-11-07 NOTE — Telephone Encounter (Signed)
Pt needs sent to mail order

## 2017-11-07 NOTE — Telephone Encounter (Signed)
Patient called and stated that [facility] [employer] [facility]. Fax# [phone]

## 2017-11-14 ENCOUNTER — Other Ambulatory Visit: Payer: Self-pay | Admitting: Pharmacy Technician

## 2017-11-14 NOTE — Patient Outreach (Signed)
[facility] Care Management  11/14/2017  Lori ChattersMary H Potts 1946/03/04 [id number]   Received patient and provider portion of [employer] application. In mail, to be sent to [employer] patient assistance 03/27.  Will follow up with patient when updated information received from [employer]  Shandreka Dante N. Ernesta Ambleoleman, CPhT [facility] Care Management [phone]

## 2017-11-15 ENCOUNTER — Other Ambulatory Visit: Payer: Self-pay | Admitting: Pharmacy Technician

## 2017-11-15 NOTE — Patient Outreach (Signed)
[facility] Care Management  11/15/2017  Lori ChattersMary H Potts 1946/01/21 [id number]   Unsuccessful call attempt #1 in reference to [employer] patient assistance. HIPAA compliant voicemail left.  Will make 2nd call attempt 03/28 if call not returned.  Suzan SlickAshley N. Ernesta Ambleoleman, CPhT [facility] Care Management [phone]

## 2017-11-15 NOTE — Patient Outreach (Signed)
[facility] Care Management  11/15/2017  Ward ChattersMary H Ritsema 28-Aug-1945 [id number]   Incoming return call from [name], HIPAA identifiers verified. Informed [name] that I mailed [employer] patient assistance application out today. Informed her that I would follow up with her in about 2 weeks once Ive received updated information from [employer].  Suzan SlickAshley N. Ernesta Ambleoleman, CPhT [facility] Care Management [phone]

## 2017-11-16 ENCOUNTER — Ambulatory Visit: Payer: Self-pay | Admitting: Pharmacy Technician

## 2017-11-29 ENCOUNTER — Ambulatory Visit: Payer: Self-pay | Admitting: Pharmacy Technician

## 2017-11-30 ENCOUNTER — Ambulatory Visit
Admission: EM | Admit: 2017-11-30 | Discharge: 2017-11-30 | Disposition: A | Discharge status: Home / Self Care | Payer: Medicare HMO | Attending: Family Medicine | Admitting: Family Medicine

## 2017-11-30 ENCOUNTER — Other Ambulatory Visit: Payer: Self-pay

## 2017-11-30 ENCOUNTER — Ambulatory Visit: Payer: Medicare HMO | Admitting: Nurse Practitioner

## 2017-11-30 DIAGNOSIS — J302 Other seasonal allergic rhinitis: Secondary | ICD-10-CM

## 2017-11-30 MED ORDER — KETOTIFEN FUMARATE 0.025 % OP SOLN: 1.0000 [drp] | Freq: Two times a day (BID) | OPHTHALMIC | 0 refills | Status: DC

## 2017-11-30 MED ORDER — FEXOFENADINE-PSEUDOEPHED ER 60-120 MG PO TB12: 1.0000 | ORAL_TABLET | Freq: Two times a day (BID) | ORAL | 0 refills | Status: DC

## 2017-11-30 MED ORDER — FEXOFENADINE HCL 180 MG PO TABS: 180.0000 mg | ORAL_TABLET | Freq: Every day | ORAL | 0 refills | Status: DC

## 2017-11-30 NOTE — Discharge Instructions (Signed)
Use Flonase on a daily basis as well as well as the Allegra throughout the entire spring season

## 2017-11-30 NOTE — ED Provider Notes (Signed)
[facility]    CSN: [id number] Arrival date & time: 11/30/17  1146     History   Chief Complaint Chief Complaint  Patient presents with  . Facial Pain    HPI Lori Potts is a 72 y.o. female.   HPI   72 year old female presents with left-sided facial pain extending into her ear with a feeling of fullness.  She is also been experiencing a runny nose with clear secretions.  Her left eye this morning was matted and has she has had ear itching with a clear discharge from that eye.  She has had no fever or chills.  Does not have a cough.         Past Medical History:  Diagnosis Date  . Arthritis   . 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 Cataract    right eye but immature  . Essential hypertension, benign    takes Lisinopril-HCTZ daily  . GERD (gastroesophageal reflux disease)   . Headache    sinus  . History of colon polyps    benign  . History of migraine   . History of shingles   . Hyperlipidemia    takes Lipitor daily  . Low back pain   . Nocturia   . S/P insertion of spinal cord stimulator   . Seasonal allergies    takes Singulair daily as needed  . Type II or unspecified type diabetes mellitus without mention of complication, not stated as uncontrolled   . Vitamin D deficiency    takes Vit D weekly  . Weakness    numbness and tingling left arm  . Wears dentures    full upper and lower    Patient Active Problem List   Diagnosis Date Noted  . Anemia 09/22/2017  . S/P knee replacement 07/28/2017  . Allergic rhinitis 05/26/2017  . Vitamin D deficiency 05/19/2017  . Chronic nonintractable headache 05/18/2017  . Special screening for malignant neoplasms, colon   . Gastroesophageal reflux disease   . Gastritis without bleeding   . 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 Insomnia 11/29/2016  . 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 Vasomotor flushing 04/19/2016  . Abdominal wall pain in right  flank 03/29/2016  . Diverticulosis 02/24/2016  . Hypercholesterolemia 06/16/2015  . FHx: migraine headaches 04/13/2015  . Chronic pain 04/13/2015  . Neck pain 02/26/2015  . Status post lumbar surgery 02/26/2015  . Abnormal ECG 01/27/2015  . Angina pectoris ([place]) 01/27/2015  . Chest pain 01/27/2015  . Diabetes mellitus type 2, uncontrolled, without complications ([name]) 48/54/6270  . BP (high blood pressure) 01/27/2015    Past Surgical History:  Procedure Laterality Date  . ABDOMINAL HYSTERECTOMY    . ANTERIOR CERVICAL DECOMP/DISCECTOMY FUSION N/A 02/26/2015   Procedure: ANTERIOR CERVICAL DISCECTOMY FUSION C4-5 (1 LEVEL);  Surgeon: Melina Schools, MD;  Location: [place];  Service: Orthopedics;  Laterality: N/A;  . ANTERIOR LAT LUMBAR FUSION N/A 03/21/2013   Procedure: ANTERIOR LATERAL LUMBAR FUSION 1 LEVEL/ XLIF L3-L4 ;  Surgeon: Melina Schools, MD;  Location: [place];  Service: Orthopedics;  Laterality: N/A;  . APPENDECTOMY    . AUGMENTATION MAMMAPLASTY Bilateral 1978  . BACK SURGERY    . BACK SURGERY     Lumbar fusion x 2  . BREAST EXCISIONAL BIOPSY Right 1970  . BREAST SURGERY  1990   Augementation  . CARDIAC CATHETERIZATION  5/12   ef 55%  . CARDIAC CATHETERIZATION  10/2010   [facility]; EF  55%  . CARDIAC CATHETERIZATION Left 02/16/2016   Procedure: Left Heart Cath and Coronary Angiography;  Surgeon: Yolonda Kida, MD;  Location: [place] [facility];  Service: Cardiovascular;  Laterality: Left;  . COLONOSCOPY    . COLONOSCOPY WITH PROPOFOL N/A 04/10/2017   Procedure: COLONOSCOPY WITH PROPOFOL;  Surgeon: Lucilla Lame, MD;  Location: [place];  Service: Gastroenterology;  Laterality: N/A;  Diabetic - insulin  . ESOPHAGOGASTRODUODENOSCOPY    . [place]   growth removed from left kidney   . pain stimulator    . POSTERIOR CERVICAL FUSION/FORAMINOTOMY Right 03/21/2013   Procedure: POSTERIOR L2-3 RIGHT FORAMINOTOMY;  Surgeon: Melina Schools, MD;  Location: [place];   Service: Orthopedics;  Laterality: Right;  . REVISION OF SCAR TISSUE RECTUS MUSCLE    . SMALL BOWEL REPAIR    . SPINAL CORD STIMULATOR BATTERY EXCHANGE N/A 10/17/2012   Procedure: SPINAL CORD STIMULATOR BATTERY REMOVAL;  Surgeon: Melina Schools, MD;  Location: [place];  Service: Orthopedics;  Laterality: N/A;  . SPINAL CORD STIMULATOR BATTERY EXCHANGE N/A 07/22/2015   Procedure: REIMPLANTATION OF SPINAL CORD STIMULATOR BATTERY ;  Surgeon: Melina Schools, MD;  Location: [place];  Service: Orthopedics;  Laterality: N/A;  . TOTAL KNEE ARTHROPLASTY Left   . TOTAL KNEE ARTHROPLASTY Right 07/28/2017   Procedure: RIGHT TOTAL KNEE ARTHROPLASTY;  Surgeon: Sydnee Cabal, MD;  Location: [facility];  Service: Orthopedics;  Laterality: Right;    OB History    Gravida  0   Para  0   Term  0   Preterm  0   AB  0   Living        SAB  0   TAB  0   Ectopic  0   Multiple      Live Births               Home Medications    Prior to Admission medications   Medication Sig Start Date End Date Taking? Authorizing Provider  albuterol (PROVENTIL HFA;VENTOLIN HFA) 108 (90 Base) MCG/ACT inhaler Inhale 2 puffs into the lungs every 4 (four) hours as needed (for wheezing and shortness of breath from seasonal allergies). PROVENTIL 09/22/17   Lada, Satira Anis, MD  albuterol (PROVENTIL) (2.5 MG/3ML) 0.083% nebulizer solution Take 2.5 mg by nebulization every 6 (six) hours as needed for wheezing or shortness of breath.  11/28/15   [provider]  aspirin EC 81 MG tablet Take 81 mg by mouth daily.    [provider]  atorvastatin (LIPITOR) 40 MG tablet Take 1 tablet (40 mg total) by mouth at bedtime. 09/22/17   Arnetha Courser, MD  baclofen (LIORESAL) 10 MG tablet Take 1 tablet (10 mg total) by mouth 3 (three) times daily as needed for muscle spasms. 07/28/17 07/28/18  Stilwell, Sueanne Margarita, PA-C  Blood Glucose Calibration (TRUE METRIX LEVEL 1) Low SOLN Use to check fingerstick blood sugars once a day;  LON 99 months; E11.9 11/07/17   Lada, Satira Anis, MD  Blood Glucose Monitoring Suppl (TRUE METRIX METER) w/Device KIT Use to check fingerstick blood sugars once a day; LON 99 months; E11.9 11/02/17   Lada, Satira Anis, MD  fexofenadine (ALLEGRA ALLERGY) 180 MG tablet Take 1 tablet (180 mg total) by mouth daily. StartTaking after completing Allegra-D medication 11/30/17   Lorin Picket, PA-C  fexofenadine-pseudoephedrine (ALLEGRA-D) 60-120 MG 12 hr tablet Take 1 tablet by mouth every 12 (twelve) hours. 11/30/17   Lorin Picket, PA-C  glucose blood (ACCU-CHEK AVIVA) test strip Check fingerstick blood sugars once a day; LON 99 months, Dx E11.9 10/31/17   Lada, Satira Anis, MD  glucose blood (TRUE METRIX BLOOD GLUCOSE TEST) test strip Use to check fingerstick blood sugars once a day; LON 99 months; E11.9 11/07/17   Lada, Satira Anis, MD  Insulin Pen Needle (BD ULTRA-FINE PEN NEEDLES) 29G X 12.7MM MISC For use with injectable GLP-1 inhibitor, Victoza, once a day 10/31/17   Lada, Satira Anis, MD  ipratropium-albuterol (DUONEB) 0.5-2.5 (3) MG/3ML SOLN TAKE 3 MLS BY NEBULIZATION EVERY 4 (FOUR) HOURS AS NEEDED. Patient taking differently: Take 3 mLs by nebulization every 4 (four) hours as needed (for shortness of breath or wheezing).  10/26/16   Roselee Nova, MD  ketotifen (ZADITOR) 0.025 % ophthalmic solution Place 1 drop into both eyes 2 (two) times daily. 11/30/17   Lorin Picket, PA-C  Lancets (FREESTYLE) lancets Use to check fingerstick blood sugars once a day; LON 99 months; E11.9 11/07/17   Lada, Satira Anis, MD  liraglutide (VICTOZA) 18 MG/3ML SOPN Inject 0.6 mg daily subcutaneously; this REPLACES Xultophy -- do not use both 09/12/17   Lada, Satira Anis, MD  loratadine (CLARITIN) 10 MG tablet Take 1 tablet (10 mg total) by mouth daily. 11/06/17   Hubbard Hartshorn, [name]  mometasone (NASONEX) 50 MCG/ACT nasal spray Place 2 sprays into the nose daily. Patient taking differently: Place 2 sprays into the nose daily as  needed (for allergies).  06/02/17   Roselee Nova, MD  montelukast (SINGULAIR) 10 MG tablet Take 1 tablet (10 mg total) by mouth at bedtime. 05/26/17   Arnetha Courser, MD  omeprazole (PRILOSEC) 20 MG capsule Take 1 capsule (20 mg total) by mouth 2 (two) times daily before a meal. 09/22/17   Lada, Satira Anis, MD  tamsulosin (FLOMAX) 0.4 MG CAPS capsule Take 1 capsule (0.4 mg total) by mouth daily. 10/10/17   Hubbard Hartshorn, [name]  Vitamin D, Cholecalciferol, 1000 units TABS Take 2,000 Units by mouth daily. 09/22/17   Arnetha Courser, MD    Family History Family History  Problem Relation Age of Onset  . Heart attack Mother   . Sarcoidosis Sister     Social History Social History   Tobacco Use  . Smoking status: Former Smoker    Types: Cigarettes    Last attempt to quit: 2006    Years since quitting: 13.2  . Smokeless tobacco: Never Used  . Tobacco comment: quit smoking 40yr ago  Substance Use Topics  . Alcohol use: No  . Drug use: No     Allergies   Adhesive [tape]   Review of Systems Review of Systems  Constitutional: Positive for activity change. Negative for chills, fatigue and fever.  HENT: Positive for congestion, postnasal drip, rhinorrhea, sinus pressure and sinus pain.   All other systems reviewed and are negative.    Physical Exam Triage Vital Signs ED Triage Vitals  Enc Vitals Group     BP 11/30/17 1154 (!) 161/74     Pulse Rate 11/30/17 1154 73     Resp 11/30/17 1154 16     Temp 11/30/17 1154 97.8 F (36.6 C)     Temp Source 11/30/17 1154 Oral     SpO2 11/30/17 1154 99 %     Weight 11/30/17 1155 166 lb (75.3 kg)     Height 11/30/17 1155 '5\' 3"'$  (1.6 m)     Head Circumference --  Peak Flow --      Pain Score 11/30/17 1155 7     Pain Loc --      Pain Edu? --      Excl. in Media? --    No data found.  Updated Vital Signs BP (!) 161/74 (BP Location: Left Arm)   Pulse 73   Temp 97.8 F (36.6 C) (Oral)   Resp 16   Ht '5\' 3"'$  (1.6 m)   Wt 166 lb  (75.3 kg)   SpO2 99%   BMI 29.41 kg/m   Visual Acuity Right Eye Distance:   Left Eye Distance:   Bilateral Distance:    Right Eye Near:   Left Eye Near:    Bilateral Near:     Physical Exam  Constitutional: She is oriented to person, place, and time. She appears well-developed and well-nourished. No distress.  HENT:  Head: Normocephalic.  Right Ear: External ear normal.  Left Ear: External ear normal.  Nose: Nose normal.  Mouth/Throat: Oropharynx is clear and moist. No oropharyngeal exudate.  Patient has tenderness to percussion the frontal and maxillary sinuses.  Eyes: Pupils are equal, round, and reactive to light. Conjunctivae and EOM are normal. Right eye exhibits discharge. Left eye exhibits no discharge.  Right eye shows mild swelling of the upper lid.  She has mild discharge of that is clear and watery.  She does have debris on the lashes from earlier matting.  Neck: Normal range of motion.  Pulmonary/Chest: Effort normal and breath sounds normal.  Musculoskeletal: Normal range of motion.  Lymphadenopathy:    She has no cervical adenopathy.  Neurological: She is alert and oriented to person, place, and time.  Skin: Skin is warm and dry. She is not diaphoretic.  Psychiatric: She has a normal mood and affect. Her behavior is normal. Judgment and thought content normal.  Nursing note and vitals reviewed.    UC Treatments / Results  Labs (all labs ordered are listed, but only abnormal results are displayed) Labs Reviewed - No data to display  EKG None Radiology No results found.  Procedures Procedures (including critical care time)  Medications Ordered in UC Medications - No data to display   Initial Impression / Assessment and Plan / UC Course  I have reviewed the triage vital signs and the nursing notes.  Pertinent labs & imaging results that were available during my care of the patient were reviewed by me and considered in my medical decision making (see  chart for details).     Plan: 1. Test/x-ray results and diagnosis reviewed with patient 2. rx as per orders; risks, benefits, potential side effects reviewed with patient 3. Recommend supportive treatment with Flonase and Allegra on a continuous basis during the spring season.  Provide the patient with Zaditor for her conjunctivitis.  Initially start her on Allegra-D for 7 days for decongestion component and and switch over to regular Allegra on a daily basis.  Is not improving she should follow-up with a primary care. 4. F/u prn if symptoms worsen or don't improve   Final Clinical Impressions(s) / UC Diagnoses   Final diagnoses:  Seasonal allergies    ED Discharge Orders        Ordered    fexofenadine-pseudoephedrine (ALLEGRA-D) 60-120 MG 12 hr tablet  Every 12 hours     11/30/17 1248    ketotifen (ZADITOR) 0.025 % ophthalmic solution  2 times daily     11/30/17 1248    fexofenadine (ALLEGRA ALLERGY) 180 MG  tablet  Daily     11/30/17 1248       Controlled Substance Prescriptions [place] Controlled Substance Registry consulted? Not Applicable   Lorin Picket, PA-C 11/30/17 1400

## 2017-11-30 NOTE — ED Triage Notes (Signed)
Pt with left facial and ear pain and fullness, runny nose with clear secretions. Pain 7/10

## 2017-12-01 ENCOUNTER — Encounter: Payer: Self-pay | Admitting: Nurse Practitioner

## 2017-12-01 ENCOUNTER — Other Ambulatory Visit: Payer: Self-pay | Admitting: Pharmacy Technician

## 2017-12-01 ENCOUNTER — Ambulatory Visit: Payer: Self-pay | Admitting: Pharmacy Technician

## 2017-12-01 ENCOUNTER — Ambulatory Visit (INDEPENDENT_AMBULATORY_CARE_PROVIDER_SITE_OTHER): Payer: Medicare HMO | Admitting: Nurse Practitioner

## 2017-12-01 VITALS — BP 132/76 | HR 77 | Temp 98.0°F | Resp 16 | Ht 66.0 in | Wt 166.0 lb

## 2017-12-01 DIAGNOSIS — R51 Headache: Secondary | ICD-10-CM

## 2017-12-01 DIAGNOSIS — J0110 Acute frontal sinusitis, unspecified: Secondary | ICD-10-CM

## 2017-12-01 DIAGNOSIS — H1032 Unspecified acute conjunctivitis, left eye: Secondary | ICD-10-CM

## 2017-12-01 DIAGNOSIS — R519 Headache, unspecified: Secondary | ICD-10-CM

## 2017-12-01 MED ORDER — PROMETHAZINE HCL 25 MG PO TABS: 25.0000 mg | ORAL_TABLET | Freq: Once | ORAL | Status: DC

## 2017-12-01 MED ORDER — PROMETHAZINE HCL 25 MG/ML IJ SOLN
25.0000 mg | Freq: Once | INTRAMUSCULAR | Status: AC
  Administered 2017-12-01: 25 mg via INTRAMUSCULAR

## 2017-12-01 MED ORDER — DOXYCYCLINE HYCLATE 100 MG PO TABS: 100.0000 mg | ORAL_TABLET | Freq: Two times a day (BID) | ORAL | 0 refills | Status: DC

## 2017-12-01 MED ORDER — KETOROLAC TROMETHAMINE 60 MG/2ML IM SOLN
60.0000 mg | Freq: Once | INTRAMUSCULAR | Status: AC
  Administered 2017-12-01: 60 mg via INTRAMUSCULAR

## 2017-12-01 NOTE — Patient Outreach (Signed)
[facility] Care Management  12/01/2017  Lori ChattersMary H Potts 1946-03-06 [id number]   Unsuccessful outreach attempt #1 in reference to [employer] patient assistance update, no voicemail available.  Will contact patient on Monday 04/15 if call not returned   Suzan SlickAshley N. Ernesta Ambleoleman, CPhT [facility] Care Management [phone]

## 2017-12-01 NOTE — Progress Notes (Signed)
Name: Lori Potts   MRN: [id number]    DOB: 21-Feb-1946   Date:12/01/2017       Progress Note  Subjective  Chief Complaint  Chief Complaint  Patient presents with  . Eye Problem    left eye red, itching, matted together in the morning ,painful for 5 days    HPI  Patient states about 5 days ago woke up with a headache took tylenol with some relief of pain and then went back to sleep and woke up headache was not as bad but still present- constant sharp, typical of migraine pain left sided. Patient states has pain behind both eyes. Patient noted left eye itching three days ago, got red. Patient states are crusted in the mornings has to wash of with a warm wash cloth. Patient endorses left ear pain and muffled sounds, photosensitivity, cough, clear nasal drainage . Hx of migraines. Has tried tessalon pearls, tylenol, albuterol inhaler, Singulair. Went to UC yesterday was given claratin-D and OTC eye drops.   Denies fevers, chills, sore throat, chest pain, shob, dizziness, visual changes.   Patient Active Problem List   Diagnosis Date Noted  . Anemia 09/22/2017  . S/P knee replacement 07/28/2017  . Allergic rhinitis 05/26/2017  . Vitamin D deficiency 05/19/2017  . Chronic nonintractable headache 05/18/2017  . Special screening for malignant neoplasms, colon   . Gastroesophageal reflux disease   . Gastritis without bleeding   . 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 Insomnia 11/29/2016  . 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 Vasomotor flushing 04/19/2016  . Abdominal wall pain in right flank 03/29/2016  . Diverticulosis 02/24/2016  . Hypercholesterolemia 06/16/2015  . FHx: migraine headaches 04/13/2015  . Chronic pain 04/13/2015  . Neck pain 02/26/2015  . Status post lumbar surgery 02/26/2015  . Abnormal ECG 01/27/2015  . Angina pectoris ([place]) 01/27/2015  . Chest pain 01/27/2015  . Diabetes mellitus type 2, uncontrolled, without  complications ([place]) 24/04/7352  . BP (high blood pressure) 01/27/2015    Past Medical History:  Diagnosis Date  . Arthritis   . 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 Cataract    right eye but immature  . Essential hypertension, benign    takes Lisinopril-HCTZ daily  . GERD (gastroesophageal reflux disease)   . Headache    sinus  . History of colon polyps    benign  . History of migraine   . History of shingles   . Hyperlipidemia    takes Lipitor daily  . Low back pain   . Nocturia   . S/P insertion of spinal cord stimulator   . Seasonal allergies    takes Singulair daily as needed  . Type II or unspecified type diabetes mellitus without mention of complication, not stated as uncontrolled   . Vitamin D deficiency    takes Vit D weekly  . Weakness    numbness and tingling left arm  . Wears dentures    full upper and lower    Past Surgical History:  Procedure Laterality Date  . ABDOMINAL HYSTERECTOMY    . ANTERIOR CERVICAL DECOMP/DISCECTOMY FUSION N/A 02/26/2015   Procedure: ANTERIOR CERVICAL DISCECTOMY FUSION C4-5 (1 LEVEL);  Surgeon: Melina Schools, MD;  Location: [place];  Service: Orthopedics;  Laterality: N/A;  . ANTERIOR LAT LUMBAR FUSION N/A 03/21/2013   Procedure: ANTERIOR LATERAL LUMBAR FUSION 1 LEVEL/ XLIF L3-L4 ;  Surgeon: Melina Schools, MD;  Location: [place];  Service: Orthopedics;  Laterality: N/A;  .  APPENDECTOMY    . AUGMENTATION MAMMAPLASTY Bilateral 1978  . BACK SURGERY    . BACK SURGERY     Lumbar fusion x 2  . BREAST EXCISIONAL BIOPSY Right 1970  . BREAST SURGERY  1990   Augementation  . CARDIAC CATHETERIZATION  5/12   ef 55%  . CARDIAC CATHETERIZATION  10/2010   [facility]; EF 55%  . CARDIAC CATHETERIZATION Left 02/16/2016   Procedure: Left Heart Cath and Coronary Angiography;  Surgeon: Yolonda Kida, MD;  Location: [facility];  Service: Cardiovascular;  Laterality: Left;  . COLONOSCOPY    . COLONOSCOPY WITH PROPOFOL N/A 04/10/2017    Procedure: COLONOSCOPY WITH PROPOFOL;  Surgeon: Lucilla Lame, MD;  Location: [place];  Service: Gastroenterology;  Laterality: N/A;  Diabetic - insulin  . ESOPHAGOGASTRODUODENOSCOPY    . [place]   growth removed from left kidney   . pain stimulator    . POSTERIOR CERVICAL FUSION/FORAMINOTOMY Right 03/21/2013   Procedure: POSTERIOR L2-3 RIGHT FORAMINOTOMY;  Surgeon: Melina Schools, MD;  Location: [place];  Service: Orthopedics;  Laterality: Right;  . REVISION OF SCAR TISSUE RECTUS MUSCLE    . SMALL BOWEL REPAIR    . SPINAL CORD STIMULATOR BATTERY EXCHANGE N/A 10/17/2012   Procedure: SPINAL CORD STIMULATOR BATTERY REMOVAL;  Surgeon: Melina Schools, MD;  Location: [place];  Service: Orthopedics;  Laterality: N/A;  . SPINAL CORD STIMULATOR BATTERY EXCHANGE N/A 07/22/2015   Procedure: REIMPLANTATION OF SPINAL CORD STIMULATOR BATTERY ;  Surgeon: Melina Schools, MD;  Location: [place];  Service: Orthopedics;  Laterality: N/A;  . TOTAL KNEE ARTHROPLASTY Left   . TOTAL KNEE ARTHROPLASTY Right 07/28/2017   Procedure: RIGHT TOTAL KNEE ARTHROPLASTY;  Surgeon: Sydnee Cabal, MD;  Location: [facility];  Service: Orthopedics;  Laterality: Right;    Social History   Tobacco Use  . Smoking status: Former Smoker    Types: Cigarettes    Last attempt to quit: 2006    Years since quitting: 13.2  . Smokeless tobacco: Never Used  . Tobacco comment: quit smoking 32yr ago  Substance Use Topics  . Alcohol use: No     Current Outpatient Medications:  .  albuterol (PROVENTIL HFA;VENTOLIN HFA) 108 (90 Base) MCG/ACT inhaler, Inhale 2 puffs into the lungs every 4 (four) hours as needed (for wheezing and shortness of breath from seasonal allergies). PROVENTIL, Disp: 3 Inhaler, Rfl: 0 .  albuterol (PROVENTIL) (2.5 MG/3ML) 0.083% nebulizer solution, Take 2.5 mg by nebulization every 6 (six) hours as needed for wheezing or shortness of breath. , Disp: , Rfl:  .  aspirin EC 81 MG tablet, Take 81 mg by  mouth daily., Disp: , Rfl:  .  atorvastatin (LIPITOR) 40 MG tablet, Take 1 tablet (40 mg total) by mouth at bedtime., Disp: 90 tablet, Rfl: 1 .  baclofen (LIORESAL) 10 MG tablet, Take 1 tablet (10 mg total) by mouth 3 (three) times daily as needed for muscle spasms., Disp: 50 tablet, Rfl: 1 .  Blood Glucose Calibration (TRUE METRIX LEVEL 1) Low SOLN, Use to check fingerstick blood sugars once a day; LON 99 months; E11.9, Disp: 1 each, Rfl: 1 .  Blood Glucose Monitoring Suppl (TRUE METRIX METER) w/Device KIT, Use to check fingerstick blood sugars once a day; LON 99 months; E11.9, Disp: 1 kit, Rfl: 0 .  fexofenadine (ALLEGRA ALLERGY) 180 MG tablet, Take 1 tablet (180 mg total) by mouth daily. StartTaking after completing Allegra-D medication, Disp: 30 tablet, Rfl: 0 .  fexofenadine-pseudoephedrine (ALLEGRA-D) 60-120 MG 12 hr tablet, Take 1 tablet by mouth every 12 (twelve) hours., Disp: 14 tablet, Rfl: 0 .  glucose blood (ACCU-CHEK AVIVA) test strip, Check fingerstick blood sugars once a day; LON 99 months, Dx E11.9, Disp: 100 each, Rfl: 3 .  glucose blood (TRUE METRIX BLOOD GLUCOSE TEST) test strip, Use to check fingerstick blood sugars once a day; LON 99 months; E11.9, Disp: 100 each, Rfl: 3 .  Insulin Pen Needle (BD ULTRA-FINE PEN NEEDLES) 29G X 12.7MM MISC, For use with injectable GLP-1 inhibitor, Victoza, once a day, Disp: 100 each, Rfl: 3 .  ipratropium-albuterol (DUONEB) 0.5-2.5 (3) MG/3ML SOLN, TAKE 3 MLS BY NEBULIZATION EVERY 4 (FOUR) HOURS AS NEEDED. (Patient taking differently: Take 3 mLs by nebulization every 4 (four) hours as needed (for shortness of breath or wheezing). ), Disp: 360 mL, Rfl: 3 .  ketotifen (ZADITOR) 0.025 % ophthalmic solution, Place 1 drop into both eyes 2 (two) times daily., Disp: 5 mL, Rfl: 0 .  Lancets (FREESTYLE) lancets, Use to check fingerstick blood sugars once a day; LON 99 months; E11.9, Disp: 100 each, Rfl: 3 .  liraglutide (VICTOZA) 18 MG/3ML SOPN, Inject 0.6 mg  daily subcutaneously; this REPLACES Xultophy -- do not use both, Disp: 5 pen, Rfl: 2 .  loratadine (CLARITIN) 10 MG tablet, Take 1 tablet (10 mg total) by mouth daily., Disp: 90 tablet, Rfl: 2 .  mometasone (NASONEX) 50 MCG/ACT nasal spray, Place 2 sprays into the nose daily. (Patient taking differently: Place 2 sprays into the nose daily as needed (for allergies). ), Disp: 17 g, Rfl: 0 .  montelukast (SINGULAIR) 10 MG tablet, Take 1 tablet (10 mg total) by mouth at bedtime., Disp: 30 tablet, Rfl: 3 .  omeprazole (PRILOSEC) 20 MG capsule, Take 1 capsule (20 mg total) by mouth 2 (two) times daily before a meal., Disp: , Rfl:  .  tamsulosin (FLOMAX) 0.4 MG CAPS capsule, Take 1 capsule (0.4 mg total) by mouth daily., Disp: 3 capsule, Rfl: 0 .  Vitamin D, Cholecalciferol, 1000 units TABS, Take 2,000 Units by mouth daily., Disp: , Rfl:   Allergies  Allergen Reactions  . Adhesive [Tape] Rash and Other (See Comments)    Regular tape is ok, allergy is to paper tape    ROS  No other specific complaints in a complete review of systems (except as listed in HPI above).  Objective  Vitals:   12/01/17 1050  BP: 132/76  Pulse: 77  Resp: 16  Temp: 98 F (36.7 C)  TempSrc: Oral  SpO2: 96%  Weight: 166 lb (75.3 kg)  Height: '5\' 6"'$  (1.676 m)     Body mass index is 26.79 kg/m.  Nursing Note and Vital Signs reviewed.  Physical Exam   Constitutional: Patient appears well-developed and well-nourished.  Patient appears uncomfortable.  HEENT: head atraumatic, normocephalic, pupils equal and reactive to light, left conjunctiva mildly erythematous no crusting noted TM's without erythema or bulging, left maxillary tenderness no frontal sinus tenderness, neck supple without lymphadenopathy, oropharynx pink and moist without exudate, clear nasal discharge Cardiovascular: Normal rate, regular rhythm, S1/S2 present.  No murmur or rub heard.  Pulmonary/Chest: Effort normal and breath sounds clear. No  respiratory distress or retractions. Abdominal: Soft and non-tender, bowel sounds present  Psychiatric: Patient has a normal mood and affect. behavior is normal. Judgment and thought content normal.  No results found for this or any previous visit (from the past 72 hour(s)).  Assessment & Plan 1. Chronic nonintractable  headache, unspecified headache type Rest, fluids - ketorolac (TORADOL) injection 60 mg - promethazine (PHENERGAN) injection 25 mg  2. Acute non-recurrent frontal sinusitis -Discussed OTC management with Claritin, Tylenol, rest, fluids, Flonase.  If unrelieved by day 7 shared decision making for delayed antibiotic prescribing  doxycycline that can be picked up on Sunday. - doxycycline (VIBRA-TABS) 100 MG tablet; Take 1 tablet (100 mg total) by mouth 2 (two) times daily.  Dispense: 20 tablet; Refill: 0  3. Acute conjunctivitis of left eye, unspecified acute conjunctivitis type -Likely viral, discussed keeping a clean and dry not infecting other eye.  If not improved in 2-3 days follow-up with eye doctor or return for antibiotic eyedrops    -Red flags and when to present for emergency care or RTC including fever >101.76F, chest pain, shortness of breath, new/worsening/un-resolving symptoms, vision changes, reviewed with patient at time of visit. Follow up and care instructions discussed and provided in AVS.

## 2017-12-01 NOTE — Patient Outreach (Signed)
[facility] Care Management  12/01/2017  Lori ChattersMary H Potts [birth year] [id number]   Outreach call to [employer] patient assistance in reference to Proventil HFA application. Spoke to Ed who stated that they received the application and mailed out attestation letter to patient on 04/10  Will inform patient  Suzan Slickshley N. Ernesta Ambleoleman, CPhT [facility] Care Management [phone]

## 2017-12-02 ENCOUNTER — Other Ambulatory Visit: Payer: Self-pay | Admitting: Family Medicine

## 2017-12-02 DIAGNOSIS — E7800 Pure hypercholesterolemia, unspecified: Secondary | ICD-10-CM

## 2017-12-02 NOTE — Telephone Encounter (Signed)
Request received for atorvastatin I [facility]; she should not be out

## 2017-12-04 ENCOUNTER — Ambulatory Visit: Payer: Self-pay | Admitting: Pharmacy Technician

## 2017-12-05 ENCOUNTER — Other Ambulatory Visit: Payer: Self-pay | Admitting: Pharmacy Technician

## 2017-12-05 NOTE — Patient Outreach (Signed)
[facility] Care Management  12/05/2017  Ward ChattersMary H Deasis 1946-03-05 [id number]   Incoming call from patient in reference to [employer] attestation letter, Informed patient of how to fill out attestation and what to include in mailing it back to [employer] patient assistance. Patient completed while I was on the phone and stated she would place it in the mailbox today. Informed patient that I would follow up with her in about a week and a half, once I found out shipping information.  Will follow up with [employer] in 7-10 days  Dana [employer]hley N. Ernesta Ambleoleman, CPhT [facility] Care Management [phone]

## 2017-12-05 NOTE — Patient Outreach (Signed)
[facility] Care Management  12/05/2017  Ward ChattersMary H Grimmett 05/19/1946 [id number]   Successful outreach call to patient, HIPAA identifiers verified. Informed patient that [employer] mailed out attestation letter on 11/29/17. Patient stated that when she receives it, she will call me back so that I could talk her thru the required steps to fill and mail back letter correctly.  Suzan SlickAshley N. Ernesta Ambleoleman, CPhT [facility] Care Management [phone]

## 2017-12-25 ENCOUNTER — Other Ambulatory Visit: Payer: Self-pay | Admitting: Pharmacy Technician

## 2017-12-25 NOTE — Patient Outreach (Signed)
[facility]) Care Management  12/25/2017  Lori Potts 1945-09-23 [id number]   Unsuccessful outreach call #1 in regards to receiving medication from [employer] patient assistance, HIPAA compliant voicemail left.  Will call patient back Wednesday if call not returned.  [name] [name] [facility] Care Management [phone]

## 2017-12-25 NOTE — Patient Outreach (Signed)
[facility]) Care Management  12/25/2017  Lori Potts 10-11-1945 [id number]   Incoming return call from patient, HIPAA identifiers verified. Patient stated she has not received her Proventil from [employer] patient assistance yet but will contact me once she has received them.   Will follow up with patient on Friday if she has not contacted me.  [name] [name] [facility] Care Management [phone]

## 2017-12-26 ENCOUNTER — Ambulatory Visit (INDEPENDENT_AMBULATORY_CARE_PROVIDER_SITE_OTHER): Payer: Medicare HMO | Admitting: Nurse Practitioner

## 2017-12-26 ENCOUNTER — Ambulatory Visit: Payer: Self-pay

## 2017-12-26 ENCOUNTER — Encounter: Payer: Self-pay | Admitting: Nurse Practitioner

## 2017-12-26 VITALS — BP 128/72 | HR 83 | Temp 98.1°F | Resp 16 | Ht 66.0 in | Wt 169.7 lb

## 2017-12-26 DIAGNOSIS — G43019 Migraine without aura, intractable, without status migrainosus: Secondary | ICD-10-CM

## 2017-12-26 MED ORDER — KETOROLAC TROMETHAMINE 60 MG/2ML IM SOLN
60.0000 mg | Freq: Once | INTRAMUSCULAR | Status: AC
  Administered 2017-12-26: 60 mg via INTRAMUSCULAR

## 2017-12-26 MED ORDER — KETOROLAC TROMETHAMINE 30 MG/ML IJ SOLN: 30.0000 mg | Freq: Once | INTRAMUSCULAR | Status: DC

## 2017-12-26 MED ORDER — IBUPROFEN 600 MG PO TABS: 600.0000 mg | ORAL_TABLET | Freq: Three times a day (TID) | ORAL | 0 refills | Status: DC | PRN

## 2017-12-26 MED ORDER — PROMETHAZINE HCL 25 MG/ML IJ SOLN
25.0000 mg | Freq: Once | INTRAMUSCULAR | Status: AC
  Administered 2017-12-26: 25 mg via INTRAMUSCULAR

## 2017-12-26 NOTE — Progress Notes (Addendum)
Name: Lori Potts   MRN: [id number]    DOB: 1945-10-06   Date:12/26/2017       Progress Note  Subjective  Chief Complaint  Chief Complaint  Patient presents with  . Migraine    since 3 am this morning    HPI  Endorses migraine started this morning at 3 am typical of her migraine pain. Took extra strength tylenol this morning with no relief. Patient endorses left sided headache with photosensitivity and phonosensitivity. In the past when she has had this relieved by IM injections. Pt took blood pressure at home today and was 188/84 states took it later and it was 166/88 she took a lisinopril '20mg'$  pill that she was on in the past this afternoon and pressure has decreased. No other neuro deficits noted.   Patient Active Problem List   Diagnosis Date Noted  . Anemia 09/22/2017  . S/P knee replacement 07/28/2017  . Allergic rhinitis 05/26/2017  . Vitamin D deficiency 05/19/2017  . Chronic nonintractable headache 05/18/2017  . Special screening for malignant neoplasms, colon   . Gastroesophageal reflux disease   . Gastritis without bleeding   . 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 Insomnia 11/29/2016  . 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 Vasomotor flushing 04/19/2016  . Abdominal wall pain in right flank 03/29/2016  . Diverticulosis 02/24/2016  . Hypercholesterolemia 06/16/2015  . FHx: migraine headaches 04/13/2015  . Chronic pain 04/13/2015  . Neck pain 02/26/2015  . Status post lumbar surgery 02/26/2015  . Abnormal ECG 01/27/2015  . Angina pectoris ([place]) 01/27/2015  . Chest pain 01/27/2015  . Diabetes mellitus type 2, uncontrolled, without complications ([place]) 78/46/9629  . BP (high blood pressure) 01/27/2015    Past Medical History:  Diagnosis Date  . Arthritis   . 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 Cataract    right eye but immature  . Essential hypertension, benign    takes Lisinopril-HCTZ daily  . GERD  (gastroesophageal reflux disease)   . Headache    sinus  . History of colon polyps    benign  . History of migraine   . History of shingles   . Hyperlipidemia    takes Lipitor daily  . Low back pain   . Nocturia   . S/P insertion of spinal cord stimulator   . Seasonal allergies    takes Singulair daily as needed  . Type II or unspecified type diabetes mellitus without mention of complication, not stated as uncontrolled   . Vitamin D deficiency    takes Vit D weekly  . Weakness    numbness and tingling left arm  . Wears dentures    full upper and lower    Past Surgical History:  Procedure Laterality Date  . ABDOMINAL HYSTERECTOMY    . ANTERIOR CERVICAL DECOMP/DISCECTOMY FUSION N/A 02/26/2015   Procedure: ANTERIOR CERVICAL DISCECTOMY FUSION C4-5 (1 LEVEL);  Surgeon: Melina Schools, MD;  Location: [place];  Service: Orthopedics;  Laterality: N/A;  . ANTERIOR LAT LUMBAR FUSION N/A 03/21/2013   Procedure: ANTERIOR LATERAL LUMBAR FUSION 1 LEVEL/ XLIF L3-L4 ;  Surgeon: Melina Schools, MD;  Location: [place];  Service: Orthopedics;  Laterality: N/A;  . APPENDECTOMY    . AUGMENTATION MAMMAPLASTY Bilateral 1978  . BACK SURGERY    . BACK SURGERY     Lumbar fusion x 2  . BREAST EXCISIONAL BIOPSY Right 1970  . BREAST SURGERY  1990   Augementation  . CARDIAC CATHETERIZATION  5/12   ef 55%  . CARDIAC CATHETERIZATION  10/2010   [facility]; EF 55%  . CARDIAC CATHETERIZATION Left 02/16/2016   Procedure: Left Heart Cath and Coronary Angiography;  Surgeon: Yolonda Kida, MD;  Location: [facility];  Service: Cardiovascular;  Laterality: Left;  . COLONOSCOPY    . COLONOSCOPY WITH PROPOFOL N/A 04/10/2017   Procedure: COLONOSCOPY WITH PROPOFOL;  Surgeon: Lucilla Lame, MD;  Location: [place];  Service: Gastroenterology;  Laterality: N/A;  Diabetic - insulin  . ESOPHAGOGASTRODUODENOSCOPY    . [place]   growth removed from left kidney   . pain stimulator    . POSTERIOR  CERVICAL FUSION/FORAMINOTOMY Right 03/21/2013   Procedure: POSTERIOR L2-3 RIGHT FORAMINOTOMY;  Surgeon: Melina Schools, MD;  Location: [employer];  Service: Orthopedics;  Laterality: Right;  . REVISION OF SCAR TISSUE RECTUS MUSCLE    . SMALL BOWEL REPAIR    . SPINAL CORD STIMULATOR BATTERY EXCHANGE N/A 10/17/2012   Procedure: SPINAL CORD STIMULATOR BATTERY REMOVAL;  Surgeon: Melina Schools, MD;  Location: [place];  Service: Orthopedics;  Laterality: N/A;  . SPINAL CORD STIMULATOR BATTERY EXCHANGE N/A 07/22/2015   Procedure: REIMPLANTATION OF SPINAL CORD STIMULATOR BATTERY ;  Surgeon: Melina Schools, MD;  Location: [place];  Service: Orthopedics;  Laterality: N/A;  . TOTAL KNEE ARTHROPLASTY Left   . TOTAL KNEE ARTHROPLASTY Right 07/28/2017   Procedure: RIGHT TOTAL KNEE ARTHROPLASTY;  Surgeon: Sydnee Cabal, MD;  Location: [facility];  Service: Orthopedics;  Laterality: Right;    Social History   Tobacco Use  . Smoking status: Former Smoker    Types: Cigarettes    Last attempt to quit: 2006    Years since quitting: 13.3  . Smokeless tobacco: Never Used  . Tobacco comment: quit smoking 38yr ago  Substance Use Topics  . Alcohol use: No     Current Outpatient Medications:  .  albuterol (PROVENTIL HFA;VENTOLIN HFA) 108 (90 Base) MCG/ACT inhaler, Inhale 2 puffs into the lungs every 4 (four) hours as needed (for wheezing and shortness of breath from seasonal allergies). PROVENTIL, Disp: 3 Inhaler, Rfl: 0 .  albuterol (PROVENTIL) (2.5 MG/3ML) 0.083% nebulizer solution, Take 2.5 mg by nebulization every 6 (six) hours as needed for wheezing or shortness of breath. , Disp: , Rfl:  .  aspirin EC 81 MG tablet, Take 81 mg by mouth daily., Disp: , Rfl:  .  atorvastatin (LIPITOR) 40 MG tablet, Take 1 tablet (40 mg total) by mouth at bedtime., Disp: 90 tablet, Rfl: 1 .  baclofen (LIORESAL) 10 MG tablet, Take 1 tablet (10 mg total) by mouth 3 (three) times daily as needed for muscle spasms., Disp: 50 tablet, Rfl: 1 .   Blood Glucose Calibration (TRUE METRIX LEVEL 1) Low SOLN, Use to check fingerstick blood sugars once a day; LON 99 months; E11.9, Disp: 1 each, Rfl: 1 .  Blood Glucose Monitoring Suppl (TRUE METRIX METER) w/Device KIT, Use to check fingerstick blood sugars once a day; LON 99 months; E11.9, Disp: 1 kit, Rfl: 0 .  doxycycline (VIBRA-TABS) 100 MG tablet, Take 1 tablet (100 mg total) by mouth 2 (two) times daily., Disp: 20 tablet, Rfl: 0 .  fexofenadine (ALLEGRA ALLERGY) 180 MG tablet, Take 1 tablet (180 mg total) by mouth daily. StartTaking after completing Allegra-D medication, Disp: 30 tablet, Rfl: 0 .  fexofenadine-pseudoephedrine (ALLEGRA-D) 60-120 MG 12 hr tablet, Take 1 tablet by mouth every 12 (twelve) hours., Disp: 14 tablet, Rfl: 0 .  glucose  blood (ACCU-CHEK AVIVA) test strip, Check fingerstick blood sugars once a day; LON 99 months, Dx E11.9, Disp: 100 each, Rfl: 3 .  Insulin Pen Needle (BD ULTRA-FINE PEN NEEDLES) 29G X 12.7MM MISC, For use with injectable GLP-1 inhibitor, Victoza, once a day, Disp: 100 each, Rfl: 3 .  ipratropium-albuterol (DUONEB) 0.5-2.5 (3) MG/3ML SOLN, TAKE 3 MLS BY NEBULIZATION EVERY 4 (FOUR) HOURS AS NEEDED. (Patient taking differently: Take 3 mLs by nebulization every 4 (four) hours as needed (for shortness of breath or wheezing). ), Disp: 360 mL, Rfl: 3 .  ketotifen (ZADITOR) 0.025 % ophthalmic solution, Place 1 drop into both eyes 2 (two) times daily., Disp: 5 mL, Rfl: 0 .  Lancets (FREESTYLE) lancets, Use to check fingerstick blood sugars once a day; LON 99 months; E11.9, Disp: 100 each, Rfl: 3 .  liraglutide (VICTOZA) 18 MG/3ML SOPN, Inject 0.6 mg daily subcutaneously; this REPLACES Xultophy -- do not use both, Disp: 5 pen, Rfl: 2 .  loratadine (CLARITIN) 10 MG tablet, Take 1 tablet (10 mg total) by mouth daily., Disp: 90 tablet, Rfl: 2 .  mometasone (NASONEX) 50 MCG/ACT nasal spray, Place 2 sprays into the nose daily. (Patient taking differently: Place 2 sprays into  the nose daily as needed (for allergies). ), Disp: 17 g, Rfl: 0 .  montelukast (SINGULAIR) 10 MG tablet, Take 1 tablet (10 mg total) by mouth at bedtime., Disp: 30 tablet, Rfl: 3 .  omeprazole (PRILOSEC) 20 MG capsule, Take 1 capsule (20 mg total) by mouth 2 (two) times daily before a meal., Disp: , Rfl:  .  tamsulosin (FLOMAX) 0.4 MG CAPS capsule, Take 1 capsule (0.4 mg total) by mouth daily., Disp: 3 capsule, Rfl: 0 .  Vitamin D, Cholecalciferol, 1000 units TABS, Take 2,000 Units by mouth daily., Disp: , Rfl:  .  glucose blood (TRUE METRIX BLOOD GLUCOSE TEST) test strip, Use to check fingerstick blood sugars once a day; LON 99 months; E11.9, Disp: 100 each, Rfl: 3  Allergies  Allergen Reactions  . Adhesive [Tape] Rash and Other (See Comments)    Regular tape is ok, allergy is to paper tape    ROS  .  No other specific complaints in a complete review of systems (except as listed in HPI above).  Objective  Vitals:   12/26/17 1506  BP: 128/72  Pulse: 83  Resp: 16  Temp: 98.1 F (36.7 C)  TempSrc: Oral  SpO2: 96%  Weight: 169 lb 11.2 oz (77 kg)  Height: '5\' 6"'$  (1.676 m)     Body mass index is 27.39 kg/m.  Nursing Note and Vital Signs reviewed.  Physical Exam  Constitutional: Patient appears well-developed and well-nourished.  No distress.  HEENT: head atraumatic, normocephalic, pupils equal and reactive to light, TM's without erythema or bulging,  no maxillary or frontal sinus tenderness , neck supple without lymphadenopathy,  Cardiovascular: Normal rate, regular rhythm, S1/S2 present.  No murmur or rub heard.  Pulmonary/Chest: Effort normal and breath sounds clear. No respiratory distress or retractions. Neurological: no nystagmus, sensation and gait intact, PERRLA, strength equal bilaterally a&ox3 Psychiatric: Patient has a normal mood and affect. behavior is normal. Judgment and thought content normal.  No results found for this or any previous visit (from the past 72  hour(s)).  Assessment & Plan  1. Intractable migraine without aura and without status migrainosus - keep headache journal, discussed various at home OTC treatments for the future.  - ibuprofen (ADVIL,MOTRIN) 600 MG tablet; Take 1 tablet (600 mg  total) by mouth every 8 (eight) hours as needed for headache.  Dispense: 30 tablet; Refill: 0 - promethazine (PHENERGAN) injection 25 mg - ketorolac (TORADOL) 30 MG/ML injection 30 mg   Monitor bp at home, come back in 1-2 weeks for bp check an one month for routine appointment   -Red flags and when to present for emergency care or RTC including fever >101.32F, chest pain, shortness of breath, new/worsening/un-resolving symptoms, unilateral weakness, visual changes reviewed with patient at time of visit. Follow up and care instructions discussed and provided in AVS.   --------------------------------------- I have reviewed this encounter including the documentation in this note and/or discussed this patient with the provider, Suezanne Cheshire DNP AGNP-C. I am certifying that I agree with the content of this note as supervising physician. Enid Derry, [facility] 01/11/2018, 5:21 PM

## 2017-12-26 NOTE — Patient Instructions (Addendum)
Migraine prevention:  - Drinking lots of water Migraine Treatment in the future -  Start with tylenol if not improving within 1-2 hours take 800 mg of ibuprofen ( do not exceed 2400 mg of ibuprofen in 24 hours or 3000 mg of tylenol in 24 hours)  - keep a headache diary: when your headaches start, how they feel, what foods you've eaten recently, stress, how you've sleeping,  Migraine Headache A migraine headache is a very strong throbbing pain on one side or both sides of your head. Migraines can also cause other symptoms. Talk with your doctor about what things may bring on (trigger) your migraine headaches. Follow these instructions at home: Medicines  Take over-the-counter and prescription medicines only as told by your doctor.  Do not drive or use heavy machinery while taking prescription pain medicine.  To prevent or treat constipation while you are taking prescription pain medicine, your doctor may recommend that you: ? Drink enough fluid to keep your pee (urine) clear or pale yellow. ? Take over-the-counter or prescription medicines. ? Eat foods that are high in fiber. These include fresh fruits and vegetables, whole grains, and beans. ? Limit foods that are high in fat and processed sugars. These include fried and sweet foods. Lifestyle  Avoid alcohol.  Do not use any products that contain nicotine or tobacco, such as cigarettes and e-cigarettes. If you need help quitting, ask your doctor.  Get at least 8 hours of sleep every night.  Limit your stress. General instructions   Keep a journal to find out what may bring on your migraines. For example, write down: ? What you eat and drink. ? How much sleep you get. ? Any change in what you eat or drink. ? Any change in your medicines.  If you have a migraine: ? Avoid things that make your symptoms worse, such as bright lights. ? It may help to lie down in a dark, quiet room. ? Do not drive or use heavy machinery. ? Ask your  doctor what activities are safe for you.  Keep all follow-up visits as told by your doctor. This is important. Contact a doctor if:  You get a migraine that is different or worse than your usual migraines. Get help right away if:  Your migraine gets very bad.  You have a fever.  You have a stiff neck.  You have trouble seeing.  Your muscles feel weak or like you cannot control them.  You start to lose your balance a lot.  You start to have trouble walking.  You pass out (faint). This information is not intended to replace advice given to you by your health care provider. Make sure you discuss any questions you have with your health care provider. Document Released: 05/17/2008 Document Revised: 02/26/2016 Document Reviewed: 01/25/2016 Elsevier Interactive Patient Education  2018 [employer].

## 2017-12-26 NOTE — Telephone Encounter (Signed)
Patient has appointment today at 3 

## 2017-12-26 NOTE — Telephone Encounter (Signed)
Pt. Called to report headache and elevated BP.  Stated the headache started at 3:20 AM, and woke her up.  Reported the headache has increased since the onset at 3:20 AM.  Rated pain at 8/10.  Has hx of Migraines, and stated this is how her migraines present.  C/o headache across forehead and down the right side of head and back of neck.  Stated her BP was 185/85 @ 9:50 AM, and 180/84 @ 10:10 AM.  Stated she took a Lisinopril 20 mg. this morning, that she had on hand.  Also, took ES Tylenol 2 tablets @ 6:30 AM today; was not helpful.  Reported her blood pressure has not been that high.  Denied numbness or weakness of face or extremities, or change in vision.  Advised with severity of headache, she should be seen within 4 hrs.  Pt. Declined going to UC or ER.   Called to [facility].  Advised that Dr. Sherie Don does not have any openings.  Suggested to recommend UC or ED, and if pt. refuses, then can schedule with NP this afternoon at 3:20 PM.  Informed pt. That Dr. Sherie Don does not have any openings, and she should go to UC or ED within 4 hours, due to severity of headache.  Stated "it would take that long to be seen in the ER."  Stated she preferred to be seen in office this afternoon.  Appt. sched. with NP at 3:20 PM; strongly advised to go to ER if symptoms worsen.  Pt. Verb. Understanding.         Reason for Disposition . [1] SEVERE headache (e.g., excruciating) AND [2] not improved after 2 hours of pain medicine  Answer Assessment - Initial Assessment Questions 1. LOCATION: "Where does it hurt?"      On forehead and down right side and back of neck  2. ONSET: "When did the headache start?" (Minutes, hours or days)      About 3:20 AM 3. PATTERN: "Does the pain come and go, or has it been constant since it started?"     Constant  4. SEVERITY: "How bad is the pain?" and "What does it keep you from doing?"  (e.g., Scale 1-10; mild, moderate, or severe)   - MILD (1-3): doesn't interfere with normal activities    -  MODERATE (4-7): interferes with normal activities or awakens from sleep    - SEVERE (8-10): excruciating pain, unable to do any normal activities        Progressed to 8/10  5. RECURRENT SYMPTOM: "Have you ever had headaches before?" If so, ask: "When was the last time?" and "What happened that time?"      Hx of migraine headaches  6. CAUSE: "What do you think is causing the headache?"     unknown 7. MIGRAINE: "Have you been diagnosed with migraine headaches?" If so, ask: "Is this headache similar?"      About the same  8. HEAD INJURY: "Has there been any recent injury to the head?"      Denied  9. OTHER SYMPTOMS: "Do you have any other symptoms?" (fever, stiff neck, eye pain, sore throat, cold symptoms)     No dizziness, blurred vision, eye pain, or stiff neck  10. PREGNANCY: "Is there any chance you are pregnant?" "When was your last menstrual period?"       N/a  Protocols used: HEADACHE-A-AH

## 2017-12-29 ENCOUNTER — Other Ambulatory Visit: Payer: Self-pay | Admitting: Pharmacy Technician

## 2017-12-29 NOTE — Patient Outreach (Signed)
[facility]) Care Management  12/29/2017  Lori Potts 1946/04/24 [id number]   Unsuccessful outreach call to patient in regards to receiving her Proventil inhalers from [employer] patient assistance, HIPAA compliant voicemail left.   Will contact patient on Monday if call not returned today.  [name] [name] [facility] Care Management [phone]

## 2017-12-30 ENCOUNTER — Other Ambulatory Visit: Payer: Self-pay | Admitting: Family Medicine

## 2017-12-30 DIAGNOSIS — E7800 Pure hypercholesterolemia, unspecified: Secondary | ICD-10-CM

## 2018-01-01 ENCOUNTER — Other Ambulatory Visit: Payer: Self-pay | Admitting: Pharmacist

## 2018-01-01 ENCOUNTER — Other Ambulatory Visit: Payer: Self-pay | Admitting: Pharmacy Technician

## 2018-01-01 DIAGNOSIS — G5781 Other specified mononeuropathies of right lower limb: Secondary | ICD-10-CM | POA: Diagnosis not present

## 2018-01-01 DIAGNOSIS — M25561 Pain in right knee: Secondary | ICD-10-CM | POA: Diagnosis not present

## 2018-01-01 DIAGNOSIS — M1711 Unilateral primary osteoarthritis, right knee: Secondary | ICD-10-CM | POA: Diagnosis not present

## 2018-01-01 NOTE — Telephone Encounter (Signed)
Duplicate request; will send to local pharmacy

## 2018-01-01 NOTE — Patient Outreach (Signed)
[facility]) Care Management  01/01/2018  Lori Potts 21-Jun-1946 [id number]  Successful call to [name] today.  Patient has received inhaler through PAP and has no further questions or concerns with medications or medication assistance.  She is aware she can call me in the future if any other issues arise.   I will close [facility] pharmacy case at this time.   Haynes Hoehn, PharmD, [facility] Clinical Pharmacist [facility] [phone]

## 2018-01-01 NOTE — Patient Outreach (Signed)
[facility]) Care Management  01/01/2018  Lori Potts Apr 14, 1946 [id number]   Incoming call from patient stating she received her Proventil inhalers in the mail on Friday. Counseled patient on how to obtain refills.   Will route to [facility] for case closure.  [name] [name] [facility] Care Management [phone]

## 2018-01-04 ENCOUNTER — Telehealth: Payer: Self-pay | Admitting: Family Medicine

## 2018-01-04 NOTE — Telephone Encounter (Signed)
I agree with recommendation given I'll send back to you to schedule / call if cancellation

## 2018-01-04 NOTE — Telephone Encounter (Signed)
FYI: Pt called to schedule an appt for heart palpitations, first available is Tuesday 01/09/18. Pt was advised to go to UC or ER, pt declined and asked for our office to call if we have a cancellation.

## 2018-01-09 ENCOUNTER — Encounter: Payer: Self-pay | Admitting: Nurse Practitioner

## 2018-01-09 ENCOUNTER — Ambulatory Visit: Payer: Medicare HMO

## 2018-01-09 ENCOUNTER — Ambulatory Visit (INDEPENDENT_AMBULATORY_CARE_PROVIDER_SITE_OTHER): Payer: Medicare HMO | Admitting: Nurse Practitioner

## 2018-01-09 VITALS — BP 138/80 | HR 85 | Resp 16 | Ht 66.0 in | Wt 170.7 lb

## 2018-01-09 DIAGNOSIS — M25561 Pain in right knee: Secondary | ICD-10-CM | POA: Diagnosis not present

## 2018-01-09 DIAGNOSIS — Z96651 Presence of right artificial knee joint: Secondary | ICD-10-CM | POA: Diagnosis not present

## 2018-01-09 DIAGNOSIS — E1165 Type 2 diabetes mellitus with hyperglycemia: Secondary | ICD-10-CM | POA: Diagnosis not present

## 2018-01-09 DIAGNOSIS — R002 Palpitations: Secondary | ICD-10-CM

## 2018-01-09 DIAGNOSIS — Z23 Encounter for immunization: Secondary | ICD-10-CM

## 2018-01-09 DIAGNOSIS — G8929 Other chronic pain: Secondary | ICD-10-CM | POA: Diagnosis not present

## 2018-01-09 DIAGNOSIS — IMO0001 Reserved for inherently not codable concepts without codable children: Secondary | ICD-10-CM

## 2018-01-09 NOTE — Patient Instructions (Addendum)
-   Call and schedule an appointment with Dr. Juliann Pares soon for palpitations - Continue drinking lots of water.  If you have not heard anything from my staff in a week about any orders/referrals/studies from today, please contact us here to follow-up [phone]    Palpitations A palpitation is the feeling that your heart:  Has an uneven (irregular) heartbeat.  Is beating faster than normal.  Is fluttering.  Is skipping a beat.  This is usually not a serious problem. In some cases, you may need more medical tests. Follow these instructions at home:  Avoid: ? Caffeine in coffee, tea, soft drinks, diet pills, and energy drinks. ? Chocolate. ? Alcohol.  Do not use any tobacco products. These include cigarettes, chewing tobacco, and e-cigarettes. If you need help quitting, ask your doctor.  Try to reduce your stress. These things may help: ? Yoga. ? Meditation. ? Physical activity. Swimming, jogging, and walking are good choices. ? A method that helps you use your mind to control things in your body, like heartbeats (biofeedback).  Get plenty of rest and sleep.  Take over-the-counter and prescription medicines only as told by your doctor.  Keep all follow-up visits as told by your doctor. This is important. Contact a doctor if:  Your heartbeat is still fast or uneven after 24 hours.  Your palpitations occur more often. Get help right away if:  You have chest pain.  You feel short of breath.  You have a very bad headache.  You feel dizzy.  You pass out (faint). This information is not intended to replace advice given to you by your health care provider. Make sure you discuss any questions you have with your health care provider. Document Released: 05/17/2008 Document Revised: 01/14/2016 Document Reviewed: 04/23/2015 Elsevier Interactive Patient Education  [employer].

## 2018-01-09 NOTE — Progress Notes (Addendum)
Name: BLANCH STANG   MRN: [id number]    DOB: 12-04-1945   Date:01/09/2018       Progress Note  Subjective  Chief Complaint  Chief Complaint  Patient presents with  . Diabetes  . Hypertension  . Insomnia  . Palpitations    she has had palpitations x 2 weeks that is gradually worsening. Palpitations are intermittent and occuring more often.    HPI  Palpitations First noticed it 1-2 weeks ago. Feels like her heart flipping/fluttering with a rhythm to it-in her chest. Patient contributed to stress and leg pain. States no episodes today but usually happens when she wakes up and occasionally throughout the day lasts a few seconds at a time. Never had this before.  Drinks one cup of coffee a day around 6:30 am, drinks a lot of water throughout the day  Denies chest pain, shortness of breath, dizziness, near syncope/syncope, fatigue, blurry vision. Denies illicit drug use.  Negative cardiac cath 2 years ago    Right knee pain  07/28/2017 had to right TKR completed by Dr. Theda Sers with [facility] had previously done left knee TKR 5-8 years ago by Dr. Theda Sers without issue and was able to return to work shortly after. With right knee was having increased pain and was told she needed further surgery but having issues with surgery due to bill. No obvious deformity, uses cane occassionally due to pain.   Patient Active Problem List   Diagnosis Date Noted  . Anemia 09/22/2017  . S/P knee replacement 07/28/2017  . Allergic rhinitis 05/26/2017  . Vitamin D deficiency 05/19/2017  . Chronic nonintractable headache 05/18/2017  . Special screening for malignant neoplasms, colon   . Gastroesophageal reflux disease   . Gastritis without bleeding   . 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 Insomnia 11/29/2016  . 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 Vasomotor flushing 04/19/2016  . Abdominal wall pain in right flank 03/29/2016  . Diverticulosis 02/24/2016   . Hypercholesterolemia 06/16/2015  . FHx: migraine headaches 04/13/2015  . Chronic pain 04/13/2015  . Neck pain 02/26/2015  . Status post lumbar surgery 02/26/2015  . Abnormal ECG 01/27/2015  . Angina pectoris ([place]) 01/27/2015  . Chest pain 01/27/2015  . Diabetes mellitus type 2, uncontrolled, without complications ([place]) 33/00/7622  . BP (high blood pressure) 01/27/2015    Past Medical History:  Diagnosis Date  . Arthritis   . 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 Cataract    right eye but immature  . Essential hypertension, benign    takes Lisinopril-HCTZ daily  . GERD (gastroesophageal reflux disease)   . Headache    sinus  . History of colon polyps    benign  . History of migraine   . History of shingles   . Hyperlipidemia    takes Lipitor daily  . Low back pain   . Nocturia   . S/P insertion of spinal cord stimulator   . Seasonal allergies    takes Singulair daily as needed  . Type II or unspecified type diabetes mellitus without mention of complication, not stated as uncontrolled   . Vitamin D deficiency    takes Vit D weekly  . Weakness    numbness and tingling left arm  . Wears dentures    full upper and lower    Past Surgical History:  Procedure Laterality Date  . ABDOMINAL HYSTERECTOMY    . ANTERIOR CERVICAL DECOMP/DISCECTOMY FUSION N/A 02/26/2015   Procedure:  ANTERIOR CERVICAL DISCECTOMY FUSION C4-5 (1 LEVEL);  Surgeon: Melina Schools, MD;  Location: [place];  Service: Orthopedics;  Laterality: N/A;  . ANTERIOR LAT LUMBAR FUSION N/A 03/21/2013   Procedure: ANTERIOR LATERAL LUMBAR FUSION 1 LEVEL/ XLIF L3-L4 ;  Surgeon: Melina Schools, MD;  Location: [place];  Service: Orthopedics;  Laterality: N/A;  . APPENDECTOMY    . AUGMENTATION MAMMAPLASTY Bilateral 1978  . BACK SURGERY    . BACK SURGERY     Lumbar fusion x 2  . BREAST EXCISIONAL BIOPSY Right 1970  . BREAST SURGERY  1990   Augementation  . CARDIAC CATHETERIZATION  5/12   ef 55%  . CARDIAC  CATHETERIZATION  10/2010   [facility]; EF 55%  . CARDIAC CATHETERIZATION Left 02/16/2016   Procedure: Left Heart Cath and Coronary Angiography;  Surgeon: Yolonda Kida, MD;  Location: [place] [facility];  Service: Cardiovascular;  Laterality: Left;  . COLONOSCOPY    . COLONOSCOPY WITH PROPOFOL N/A 04/10/2017   Procedure: COLONOSCOPY WITH PROPOFOL;  Surgeon: Lucilla Lame, MD;  Location: [place];  Service: Gastroenterology;  Laterality: N/A;  Diabetic - insulin  . ESOPHAGOGASTRODUODENOSCOPY    . Menan   growth removed from left kidney   . pain stimulator    . POSTERIOR CERVICAL FUSION/FORAMINOTOMY Right 03/21/2013   Procedure: POSTERIOR L2-3 RIGHT FORAMINOTOMY;  Surgeon: Melina Schools, MD;  Location: [place];  Service: Orthopedics;  Laterality: Right;  . REVISION OF SCAR TISSUE RECTUS MUSCLE    . SMALL BOWEL REPAIR    . SPINAL CORD STIMULATOR BATTERY EXCHANGE N/A 10/17/2012   Procedure: SPINAL CORD STIMULATOR BATTERY REMOVAL;  Surgeon: Melina Schools, MD;  Location: [place];  Service: Orthopedics;  Laterality: N/A;  . SPINAL CORD STIMULATOR BATTERY EXCHANGE N/A 07/22/2015   Procedure: REIMPLANTATION OF SPINAL CORD STIMULATOR BATTERY ;  Surgeon: Melina Schools, MD;  Location: [place];  Service: Orthopedics;  Laterality: N/A;  . TOTAL KNEE ARTHROPLASTY Left   . TOTAL KNEE ARTHROPLASTY Right 07/28/2017   Procedure: RIGHT TOTAL KNEE ARTHROPLASTY;  Surgeon: Sydnee Cabal, MD;  Location: [facility];  Service: Orthopedics;  Laterality: Right;    Social History   Tobacco Use  . Smoking status: Former Smoker    Types: Cigarettes    Last attempt to quit: 2006    Years since quitting: 13.3  . Smokeless tobacco: Never Used  . Tobacco comment: quit smoking 50yr ago  Substance Use Topics  . Alcohol use: No     Current Outpatient Medications:  .  albuterol (PROVENTIL HFA;VENTOLIN HFA) 108 (90 Base) MCG/ACT inhaler, Inhale 2 puffs into the lungs every 4 (four) hours as needed (for  wheezing and shortness of breath from seasonal allergies). PROVENTIL, Disp: 3 Inhaler, Rfl: 0 .  albuterol (PROVENTIL) (2.5 MG/3ML) 0.083% nebulizer solution, Take 2.5 mg by nebulization every 6 (six) hours as needed for wheezing or shortness of breath. , Disp: , Rfl:  .  aspirin EC 81 MG tablet, Take 81 mg by mouth daily., Disp: , Rfl:  .  atorvastatin (LIPITOR) 40 MG tablet, TAKE 1 TABLET BY MOUTH EVERYDAY AT BEDTIME, Disp: 90 tablet, Rfl: 0 .  baclofen (LIORESAL) 10 MG tablet, Take 1 tablet (10 mg total) by mouth 3 (three) times daily as needed for muscle spasms., Disp: 50 tablet, Rfl: 1 .  Blood Glucose Calibration (TRUE METRIX LEVEL 1) Low SOLN, Use to check fingerstick blood sugars once a day; LON 99 months; E11.9, Disp: 1 each, Rfl: 1 .  Blood Glucose Monitoring Suppl (TRUE METRIX METER) w/Device KIT, Use to check fingerstick blood sugars once a day; LON 99 months; E11.9, Disp: 1 kit, Rfl: 0 .  fexofenadine (ALLEGRA ALLERGY) 180 MG tablet, Take 1 tablet (180 mg total) by mouth daily. StartTaking after completing Allegra-D medication, Disp: 30 tablet, Rfl: 0 .  fexofenadine-pseudoephedrine (ALLEGRA-D) 60-120 MG 12 hr tablet, Take 1 tablet by mouth every 12 (twelve) hours., Disp: 14 tablet, Rfl: 0 .  glucose blood (ACCU-CHEK AVIVA) test strip, Check fingerstick blood sugars once a day; LON 99 months, Dx E11.9, Disp: 100 each, Rfl: 3 .  glucose blood (TRUE METRIX BLOOD GLUCOSE TEST) test strip, Use to check fingerstick blood sugars once a day; LON 99 months; E11.9, Disp: 100 each, Rfl: 3 .  ibuprofen (ADVIL,MOTRIN) 600 MG tablet, Take 1 tablet (600 mg total) by mouth every 8 (eight) hours as needed for headache., Disp: 30 tablet, Rfl: 0 .  Insulin Pen Needle (BD ULTRA-FINE PEN NEEDLES) 29G X 12.7MM MISC, For use with injectable GLP-1 inhibitor, Victoza, once a day, Disp: 100 each, Rfl: 3 .  ipratropium-albuterol (DUONEB) 0.5-2.5 (3) MG/3ML SOLN, TAKE 3 MLS BY NEBULIZATION EVERY 4 (FOUR) HOURS AS  NEEDED. (Patient taking differently: Take 3 mLs by nebulization every 4 (four) hours as needed (for shortness of breath or wheezing). ), Disp: 360 mL, Rfl: 3 .  Lancets (FREESTYLE) lancets, Use to check fingerstick blood sugars once a day; LON 99 months; E11.9, Disp: 100 each, Rfl: 3 .  liraglutide (VICTOZA) 18 MG/3ML SOPN, Inject 0.6 mg daily subcutaneously; this REPLACES Xultophy -- do not use both, Disp: 5 pen, Rfl: 2 .  loratadine (CLARITIN) 10 MG tablet, Take 1 tablet (10 mg total) by mouth daily., Disp: 90 tablet, Rfl: 2 .  mometasone (NASONEX) 50 MCG/ACT nasal spray, Place 2 sprays into the nose daily. (Patient taking differently: Place 2 sprays into the nose daily as needed (for allergies). ), Disp: 17 g, Rfl: 0 .  montelukast (SINGULAIR) 10 MG tablet, Take 1 tablet (10 mg total) by mouth at bedtime., Disp: 30 tablet, Rfl: 3 .  omeprazole (PRILOSEC) 20 MG capsule, Take 1 capsule (20 mg total) by mouth 2 (two) times daily before a meal., Disp: , Rfl:  .  Vitamin D, Cholecalciferol, 1000 units TABS, Take 2,000 Units by mouth daily., Disp: , Rfl:  .  doxycycline (VIBRA-TABS) 100 MG tablet, Take 1 tablet (100 mg total) by mouth 2 (two) times daily. (Patient not taking: Reported on 01/09/2018), Disp: 20 tablet, Rfl: 0 .  ketotifen (ZADITOR) 0.025 % ophthalmic solution, Place 1 drop into both eyes 2 (two) times daily. (Patient not taking: Reported on 01/09/2018), Disp: 5 mL, Rfl: 0 .  tamsulosin (FLOMAX) 0.4 MG CAPS capsule, Take 1 capsule (0.4 mg total) by mouth daily. (Patient not taking: Reported on 01/09/2018), Disp: 3 capsule, Rfl: 0  Allergies  Allergen Reactions  . Adhesive [Tape] Rash and Other (See Comments)    Regular tape is ok, allergy is to paper tape    ROS  No other specific complaints in a complete review of systems (except as listed in HPI above).  Objective  Vitals:   01/09/18 0947  BP: 138/80  Pulse: 85  Resp: 16  SpO2: 98%  Weight: 170 lb 11.2 oz (77.4 kg)  Height:  '5\' 6"'$  (1.676 m)    Body mass index is 27.55 kg/m.  Nursing Note and Vital Signs reviewed.  Physical Exam  Constitutional: Patient appears well-developed and well-nourished.No distress.  Cardiovascular: Normal rate, regular rhythm, S1/S2 present.  No murmur or rub heard.  Pulmonary/Chest: Effort normal and breath sounds clear. No respiratory distress or retractions. Abdominal: Soft and non-tender, bowel sounds present  Psychiatric: Patient has a normal mood and affect. behavior is normal. Judgment and thought content normal.  No results found for this or any previous visit (from the past 72 hour(s)).  Assessment & Plan  1. Palpitations - follow up with Dr. Clayborn Bigness  - Stress reduction, hydration, avoid caffeine - CBC - TSH - COMPLETE METABOLIC PANEL WITH GFR - EKG 12-Lead  EKG Impression: no acute changes from last EKG, Normal sinus rhythm. Incidentally while watching monitor PVC noted pt sts felt heart flip sensation unable to capture on EKG.  2. Diabetes mellitus type 2, uncontrolled, without complications ([place]) - continue therapies further discussion at 2 week follow-up  - Hemoglobin A1c - Urine Microalbumin w/creat. ratio  4. Status post right knee replacement  - Ambulatory referral to Orthopedic Surgery  5. Chronic pain of right knee  - Ambulatory referral to Orthopedic Surgery   Follow up in 2 weeks for chronic disease management   -Red flags and when to present for emergency care or RTC including fever >101.40F, chest pain, shortness of breath, new/worsening/un-resolving symptoms,  reviewed with patient at time of visit. Follow up and care instructions discussed and provided in AVS. -Reviewed Health Maintenance: completed today   ------------------------------------ I have reviewed this encounter including the documentation in this note and/or discussed this patient with the provider, Suezanne Cheshire DNP AGNP-C. I am certifying that I agree with the content of  this note as supervising physician. Enid Derry, [facility] 01/09/2018, 5:44 PM

## 2018-01-10 ENCOUNTER — Other Ambulatory Visit: Payer: Self-pay | Admitting: Nurse Practitioner

## 2018-01-10 DIAGNOSIS — I10 Essential (primary) hypertension: Secondary | ICD-10-CM

## 2018-01-10 DIAGNOSIS — R809 Proteinuria, unspecified: Secondary | ICD-10-CM

## 2018-01-10 DIAGNOSIS — Z794 Long term (current) use of insulin: Secondary | ICD-10-CM

## 2018-01-10 DIAGNOSIS — E1329 Other specified diabetes mellitus with other diabetic kidney complication: Secondary | ICD-10-CM

## 2018-01-10 LAB — COMPLETE METABOLIC PANEL WITH GFR
AG Ratio: 1.5 (calc) (ref 1.0–2.5)
ALT: 16 U/L (ref 6–29)
AST: 20 U/L (ref 10–35)
Albumin: 4.3 g/dL (ref 3.6–5.1)
Alkaline phosphatase (APISO): 103 U/L (ref 33–130)
BUN: 17 mg/dL (ref 7–25)
CO2: 28 mmol/L (ref 20–32)
Calcium: 9.6 mg/dL (ref 8.6–10.4)
Chloride: 107 mmol/L (ref 98–110)
Creat: 0.68 mg/dL (ref 0.60–0.93)
GFR, Est African American: 101 mL/min/{1.73_m2} (ref >=60)
GFR, Est Non African American: 87 mL/min/{1.73_m2} (ref >=60)
Globulin: 2.9 g/dL (calc) (ref 1.9–3.7)
Glucose, Bld: 89 mg/dL (ref 65–139)
Potassium: 4 mmol/L (ref 3.5–5.3)
Sodium: 141 mmol/L (ref 135–146)
Total Bilirubin: 0.4 mg/dL (ref 0.2–1.2)
Total Protein: 7.2 g/dL (ref 6.1–8.1)

## 2018-01-10 LAB — MICROALBUMIN / CREATININE URINE RATIO
Creatinine, Urine: 45 mg/dL (ref 20–275)
Microalb Creat Ratio: 298 mcg/mg creat — ABNORMAL HIGH (ref <30)
Microalb, Ur: 13.4 mg/dL

## 2018-01-10 LAB — TSH: TSH: 1.25 mIU/L (ref 0.40–4.50)

## 2018-01-10 LAB — HEMOGLOBIN A1C
Hgb A1c MFr Bld: 6.5 % of total Hgb — ABNORMAL HIGH (ref <5.7)
Mean Plasma Glucose: 140 (calc)
eAG (mmol/L): 7.7 (calc)

## 2018-01-10 LAB — CBC
HCT: 36.4 % (ref 35.0–45.0)
Hemoglobin: 12.4 g/dL (ref 11.7–15.5)
MCH: 25.4 pg — ABNORMAL LOW (ref 27.0–33.0)
MCHC: 34.1 g/dL (ref 32.0–36.0)
MCV: 74.4 fL — ABNORMAL LOW (ref 80.0–100.0)
MPV: 10.5 fL (ref 7.5–12.5)
Platelets: 360 10*3/uL (ref 140–400)
RBC: 4.89 10*6/uL (ref 3.80–5.10)
RDW: 16.1 % — ABNORMAL HIGH (ref 11.0–15.0)
WBC: 3.6 10*3/uL — ABNORMAL LOW (ref 3.8–10.8)

## 2018-01-10 MED ORDER — LOSARTAN POTASSIUM 25 MG PO TABS: 25.0000 mg | ORAL_TABLET | Freq: Every day | ORAL | 0 refills | Status: DC

## 2018-01-10 NOTE — Progress Notes (Unsigned)
[place]art 

## 2018-01-10 NOTE — Addendum Note (Signed)
Addended by: Cheryle Horsfall on: 01/10/2018 02:52 PM   Modules accepted: Orders

## 2018-01-10 NOTE — Progress Notes (Signed)
Patient notified

## 2018-01-22 ENCOUNTER — Ambulatory Visit (INDEPENDENT_AMBULATORY_CARE_PROVIDER_SITE_OTHER): Payer: Medicare HMO | Admitting: Nurse Practitioner

## 2018-01-22 ENCOUNTER — Encounter: Payer: Self-pay | Admitting: Nurse Practitioner

## 2018-01-22 VITALS — BP 122/78 | HR 77 | Temp 97.7°F | Resp 16 | Ht 66.0 in | Wt 170.8 lb

## 2018-01-22 DIAGNOSIS — Z794 Long term (current) use of insulin: Secondary | ICD-10-CM | POA: Diagnosis not present

## 2018-01-22 DIAGNOSIS — Z23 Encounter for immunization: Secondary | ICD-10-CM | POA: Diagnosis not present

## 2018-01-22 DIAGNOSIS — E0829 Diabetes mellitus due to underlying condition with other diabetic kidney complication: Secondary | ICD-10-CM | POA: Diagnosis not present

## 2018-01-22 DIAGNOSIS — Z96651 Presence of right artificial knee joint: Secondary | ICD-10-CM | POA: Diagnosis not present

## 2018-01-22 DIAGNOSIS — I10 Essential (primary) hypertension: Secondary | ICD-10-CM

## 2018-01-22 DIAGNOSIS — Z79899 Other long term (current) drug therapy: Secondary | ICD-10-CM

## 2018-01-22 DIAGNOSIS — R809 Proteinuria, unspecified: Secondary | ICD-10-CM | POA: Insufficient documentation

## 2018-01-22 NOTE — Patient Instructions (Addendum)
- Call and see if you can get earlier appointment for ortho (also request to be put on call list)  - Knee brace, alternate between ice/heat 20 min at a time 4-5 times a day - Tylenol as needed for pain ( do not exceed 3,000 mg/day) - can take ibuprofen very sparingly as needed for breakthrough pain.  - work hard on diet and exercise for diabetes; will recheck A1C if still uncontrolled highly recommend management with medication in addition to lifestyle modifications.    Diabetes Mellitus and Nutrition When you have diabetes (diabetes mellitus), it is very important to have healthy eating habits because your blood sugar (glucose) levels are greatly affected by what you eat and drink. Eating healthy foods in the appropriate amounts, at about the same times every day, can help you:  Control your blood glucose.  Lower your risk of heart disease.  Improve your blood pressure.  Reach or maintain a healthy weight.  Every person with diabetes is different, and each person has different needs for a meal plan. Your health care provider may recommend that you work with a diet and nutrition specialist (dietitian) to make a meal plan that is best for you. Your meal plan may vary depending on factors such as:  The calories you need.  The medicines you take.  Your weight.  Your blood glucose, blood pressure, and cholesterol levels.  Your activity level.  Other health conditions you have, such as heart or kidney disease.  How do carbohydrates affect me? Carbohydrates affect your blood glucose level more than any other type of food. Eating carbohydrates naturally increases the amount of glucose in your blood. Carbohydrate counting is a method for keeping track of how many carbohydrates you eat. Counting carbohydrates is important to keep your blood glucose at a healthy level, especially if you use insulin or take certain oral diabetes medicines. It is important to know how many carbohydrates you can  safely have in each meal. This is different for every person. Your dietitian can help you calculate how many carbohydrates you should have at each meal and for snack. Foods that contain carbohydrates include:  Bread, cereal, rice, pasta, and crackers.  Potatoes and corn.  Peas, beans, and lentils.  Milk and yogurt.  Fruit and juice.  Desserts, such as cakes, cookies, ice cream, and candy.  How does alcohol affect me? Alcohol can cause a sudden decrease in blood glucose (hypoglycemia), especially if you use insulin or take certain oral diabetes medicines. Hypoglycemia can be a life-threatening condition. Symptoms of hypoglycemia (sleepiness, dizziness, and confusion) are similar to symptoms of having too much alcohol. If your health care provider says that alcohol is safe for you, follow these guidelines:  Limit alcohol intake to no more than 1 drink per day for nonpregnant women and 2 drinks per day for men. One drink equals 12 oz of beer, 5 oz of wine, or 1 oz of hard liquor.  Do not drink on an empty stomach.  Keep yourself hydrated with water, diet soda, or unsweetened iced tea.  Keep in mind that regular soda, juice, and other mixers may contain a lot of sugar and must be counted as carbohydrates.  What are tips for following this plan? Reading food labels  Start by checking the serving size on the label. The amount of calories, carbohydrates, fats, and other nutrients listed on the label are based on one serving of the food. Many foods contain more than one serving per package.  Check  the total grams (g) of carbohydrates in one serving. You can calculate the number of servings of carbohydrates in one serving by dividing the total carbohydrates by 15. For example, if a food has 30 g of total carbohydrates, it would be equal to 2 servings of carbohydrates.  Check the number of grams (g) of saturated and trans fats in one serving. Choose foods that have low or no amount of these  fats.  Check the number of milligrams (mg) of sodium in one serving. Most people should limit total sodium intake to less than 2,300 mg per day.  Always check the nutrition information of foods labeled as "low-fat" or "nonfat". These foods may be higher in added sugar or refined carbohydrates and should be avoided.  Talk to your dietitian to identify your daily goals for nutrients listed on the label. Shopping  Avoid buying canned, premade, or processed foods. These foods tend to be high in fat, sodium, and added sugar.  Shop around the outside edge of the grocery store. This includes fresh fruits and vegetables, bulk grains, fresh meats, and fresh dairy. Cooking  Use low-heat cooking methods, such as baking, instead of high-heat cooking methods like deep frying.  Cook using healthy oils, such as olive, canola, or sunflower oil.  Avoid cooking with butter, cream, or high-fat meats. Meal planning  Eat meals and snacks regularly, preferably at the same times every day. Avoid going long periods of time without eating.  Eat foods high in fiber, such as fresh fruits, vegetables, beans, and whole grains. Talk to your dietitian about how many servings of carbohydrates you can eat at each meal.  Eat 4-6 ounces of lean protein each day, such as lean meat, chicken, fish, eggs, or tofu. 1 ounce is equal to 1 ounce of meat, chicken, or fish, 1 egg, or 1/4 cup of tofu.  Eat some foods each day that contain healthy fats, such as avocado, nuts, seeds, and fish. Lifestyle   Check your blood glucose regularly.  Exercise at least 30 minutes 5 or more days each week, or as told by your health care provider.  Take medicines as told by your health care provider.  Do not use any products that contain nicotine or tobacco, such as cigarettes and e-cigarettes. If you need help quitting, ask your health care provider.  Work with a Veterinary surgeoncounselor or diabetes educator to identify strategies to manage stress  and any emotional and social challenges. What are some questions to ask my health care provider?  Do I need to meet with a diabetes educator?  Do I need to meet with a dietitian?  What number can I call if I have questions?  When are the best times to check my blood glucose? Where to find more information:  American Diabetes Association: diabetes.org/food-and-fitness/food  Academy of Nutrition and Dietetics: https://www.vargas.com/www.eatright.org/resources/health/diseases-and-conditions/diabetes  [employer] Institute of Diabetes and Digestive and Kidney Diseases (NIH): FindJewelers.czwww.niddk.nih.gov/health-information/diabetes/overview/diet-eating-physical-activity Summary  A healthy meal plan will help you control your blood glucose and maintain a healthy lifestyle.  Working with a diet and nutrition specialist (dietitian) can help you make a meal plan that is best for you.  Keep in mind that carbohydrates and alcohol have immediate effects on your blood glucose levels. It is important to count carbohydrates and to use alcohol carefully. This information is not intended to replace advice given to you by your health care provider. Make sure you discuss any questions you have with your health care provider. Document Released: 05/05/2005 Document Revised: 09/12/2016  Document Reviewed: 09/12/2016 Elsevier Interactive Patient Education  [employer].

## 2018-01-22 NOTE — Progress Notes (Addendum)
Name: Lori Potts   MRN: [id number]    DOB: July 12, 1946   Date:01/22/2018       Progress Note  Subjective  Chief Complaint  Chief Complaint  Patient presents with  . Follow-up    Blood pressure    HPI  Hypertension Patient takes losartan daily, states since been on this has not had palpitations. Denies headaches, chest pain or dizziness.  BP Readings from Last 3 Encounters:  01/22/18 122/78  01/09/18 138/80  12/26/17 128/72     Diabetes Mellitus Patient stopped taking victoza last Wednesday- because didn't feel like it was working for her and her sugar was going up. Metformin had bad diarrhea. States does not want to be on medications. Denies polyphagia, polydipsia, polyuria  States she has been walking more and has been eating a lot of vegetables.  Blood sugar fasting today was 101 Lab Results  Component Value Date   HGBA1C 6.5 (H) 01/09/2018   Patient endorses right knee pain, states earliest ortho appt was in august, unable to go to local ortho docs to payment issue. Patient has been using ice and ibuprofen   Patient Active Problem List   Diagnosis Date Noted  . Anemia 09/22/2017  . S/P knee replacement 07/28/2017  . Allergic rhinitis 05/26/2017  . Vitamin D deficiency 05/19/2017  . Chronic nonintractable headache 05/18/2017  . Special screening for malignant neoplasms, colon   . Gastroesophageal reflux disease   . Gastritis without bleeding   . 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 Insomnia 11/29/2016  . 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 Vasomotor flushing 04/19/2016  . Abdominal wall pain in right flank 03/29/2016  . Diverticulosis 02/24/2016  . Hypercholesterolemia 06/16/2015  . FHx: migraine headaches 04/13/2015  . Chronic pain 04/13/2015  . Neck pain 02/26/2015  . Status post lumbar surgery 02/26/2015  . Abnormal ECG 01/27/2015  . Angina pectoris ([place]) 01/27/2015  . Chest pain 01/27/2015  . Diabetes  mellitus type 2, uncontrolled, without complications ([place]) 82/50/5397  . BP (high blood pressure) 01/27/2015    Past Medical History:  Diagnosis Date  . Arthritis   . 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 Cataract    right eye but immature  . Essential hypertension, benign    takes Lisinopril-HCTZ daily  . GERD (gastroesophageal reflux disease)   . Headache    sinus  . History of colon polyps    benign  . History of migraine   . History of shingles   . Hyperlipidemia    takes Lipitor daily  . Low back pain   . Nocturia   . S/P insertion of spinal cord stimulator   . Seasonal allergies    takes Singulair daily as needed  . Type II or unspecified type diabetes mellitus without mention of complication, not stated as uncontrolled   . Vitamin D deficiency    takes Vit D weekly  . Weakness    numbness and tingling left arm  . Wears dentures    full upper and lower    Past Surgical History:  Procedure Laterality Date  . ABDOMINAL HYSTERECTOMY    . ANTERIOR CERVICAL DECOMP/DISCECTOMY FUSION N/A 02/26/2015   Procedure: ANTERIOR CERVICAL DISCECTOMY FUSION C4-5 (1 LEVEL);  Surgeon: Melina Schools, MD;  Location: [facility];  Service: Orthopedics;  Laterality: N/A;  . ANTERIOR LAT LUMBAR FUSION N/A 03/21/2013   Procedure: ANTERIOR LATERAL LUMBAR FUSION 1 LEVEL/ XLIF L3-L4 ;  Surgeon: Melina Schools, MD;  [facility]  OR;  Service: Orthopedics;  Laterality: N/A;  . APPENDECTOMY    . AUGMENTATION MAMMAPLASTY Bilateral 1978  . BACK SURGERY    . BACK SURGERY     Lumbar fusion x 2  . BREAST EXCISIONAL BIOPSY Right 1970  . BREAST SURGERY  1990   Augementation  . CARDIAC CATHETERIZATION  5/12   ef 55%  . CARDIAC CATHETERIZATION  10/2010   [facility]; EF 55%  . CARDIAC CATHETERIZATION Left 02/16/2016   Procedure: Left Heart Cath and Coronary Angiography;  Surgeon: Yolonda Kida, MD;  Location: [facility];  Service: Cardiovascular;  Laterality: Left;  . COLONOSCOPY    .  COLONOSCOPY WITH PROPOFOL N/A 04/10/2017   Procedure: COLONOSCOPY WITH PROPOFOL;  Surgeon: Lucilla Lame, MD;  Location: [facility];  Service: Gastroenterology;  Laterality: N/A;  Diabetic - insulin  . ESOPHAGOGASTRODUODENOSCOPY    . [place]   growth removed from left kidney   . pain stimulator    . POSTERIOR CERVICAL FUSION/FORAMINOTOMY Right 03/21/2013   Procedure: POSTERIOR L2-3 RIGHT FORAMINOTOMY;  Surgeon: Melina Schools, MD;  Location: [facility];  Service: Orthopedics;  Laterality: Right;  . REVISION OF SCAR TISSUE RECTUS MUSCLE    . SMALL BOWEL REPAIR    . SPINAL CORD STIMULATOR BATTERY EXCHANGE N/A 10/17/2012   Procedure: SPINAL CORD STIMULATOR BATTERY REMOVAL;  Surgeon: Melina Schools, MD;  Location: [facility];  Service: Orthopedics;  Laterality: N/A;  . SPINAL CORD STIMULATOR BATTERY EXCHANGE N/A 07/22/2015   Procedure: REIMPLANTATION OF SPINAL CORD STIMULATOR BATTERY ;  Surgeon: Melina Schools, MD;  Location: [facility];  Service: Orthopedics;  Laterality: N/A;  . TOTAL KNEE ARTHROPLASTY Left   . TOTAL KNEE ARTHROPLASTY Right 07/28/2017   Procedure: RIGHT TOTAL KNEE ARTHROPLASTY;  Surgeon: Sydnee Cabal, MD;  Location: [facility];  Service: Orthopedics;  Laterality: Right;    Social History   Tobacco Use  . Smoking status: Former Smoker    Types: Cigarettes    Last attempt to quit: 2006    Years since quitting: 13.4  . Smokeless tobacco: Never Used  . Tobacco comment: quit smoking 38yr ago  Substance Use Topics  . Alcohol use: No     Current Outpatient Medications:  .  albuterol (PROVENTIL HFA;VENTOLIN HFA) 108 (90 Base) MCG/ACT inhaler, Inhale 2 puffs into the lungs every 4 (four) hours as needed (for wheezing and shortness of breath from seasonal allergies). PROVENTIL, Disp: 3 Inhaler, Rfl: 0 .  albuterol (PROVENTIL) (2.5 MG/3ML) 0.083% nebulizer solution, Take 2.5 mg by nebulization every 6 (six) hours as needed for wheezing or shortness of breath. , Disp: , Rfl:  .   aspirin EC 81 MG tablet, Take 81 mg by mouth daily., Disp: , Rfl:  .  atorvastatin (LIPITOR) 40 MG tablet, TAKE 1 TABLET BY MOUTH EVERYDAY AT BEDTIME, Disp: 90 tablet, Rfl: 0 .  baclofen (LIORESAL) 10 MG tablet, Take 1 tablet (10 mg total) by mouth 3 (three) times daily as needed for muscle spasms., Disp: 50 tablet, Rfl: 1 .  Blood Glucose Calibration (TRUE METRIX LEVEL 1) Low SOLN, Use to check fingerstick blood sugars once a day; LON 99 months; E11.9, Disp: 1 each, Rfl: 1 .  Blood Glucose Monitoring Suppl (TRUE METRIX METER) w/Device KIT, Use to check fingerstick blood sugars once a day; LON 99 months; E11.9, Disp: 1 kit, Rfl: 0 .  fexofenadine (ALLEGRA ALLERGY) 180 MG tablet, Take 1 tablet (180 mg total) by mouth daily. StartTaking after completing Allegra-D medication,  Disp: 30 tablet, Rfl: 0 .  fexofenadine-pseudoephedrine (ALLEGRA-D) 60-120 MG 12 hr tablet, Take 1 tablet by mouth every 12 (twelve) hours., Disp: 14 tablet, Rfl: 0 .  glucose blood (ACCU-CHEK AVIVA) test strip, Check fingerstick blood sugars once a day; LON 99 months, Dx E11.9, Disp: 100 each, Rfl: 3 .  ibuprofen (ADVIL,MOTRIN) 600 MG tablet, Take 1 tablet (600 mg total) by mouth every 8 (eight) hours as needed for headache., Disp: 30 tablet, Rfl: 0 .  Insulin Pen Needle (BD ULTRA-FINE PEN NEEDLES) 29G X 12.7MM MISC, For use with injectable GLP-1 inhibitor, Victoza, once a day, Disp: 100 each, Rfl: 3 .  ipratropium-albuterol (DUONEB) 0.5-2.5 (3) MG/3ML SOLN, TAKE 3 MLS BY NEBULIZATION EVERY 4 (FOUR) HOURS AS NEEDED. (Patient taking differently: Take 3 mLs by nebulization every 4 (four) hours as needed (for shortness of breath or wheezing). ), Disp: 360 mL, Rfl: 3 .  Lancets (FREESTYLE) lancets, Use to check fingerstick blood sugars once a day; LON 99 months; E11.9, Disp: 100 each, Rfl: 3 .  liraglutide (VICTOZA) 18 MG/3ML SOPN, Inject 0.6 mg daily subcutaneously; this REPLACES Xultophy -- do not use both, Disp: 5 pen, Rfl: 2 .   loratadine (CLARITIN) 10 MG tablet, Take 1 tablet (10 mg total) by mouth daily., Disp: 90 tablet, Rfl: 2 .  losartan (COZAAR) 25 MG tablet, Take 1 tablet (25 mg total) by mouth daily., Disp: 30 tablet, Rfl: 0 .  mometasone (NASONEX) 50 MCG/ACT nasal spray, Place 2 sprays into the nose daily. (Patient taking differently: Place 2 sprays into the nose daily as needed (for allergies). ), Disp: 17 g, Rfl: 0 .  montelukast (SINGULAIR) 10 MG tablet, Take 1 tablet (10 mg total) by mouth at bedtime., Disp: 30 tablet, Rfl: 3 .  omeprazole (PRILOSEC) 20 MG capsule, Take 1 capsule (20 mg total) by mouth 2 (two) times daily before a meal., Disp: , Rfl:  .  Vitamin D, Cholecalciferol, 1000 units TABS, Take 2,000 Units by mouth daily., Disp: , Rfl:   Allergies  Allergen Reactions  . Adhesive [Tape] Rash and Other (See Comments)    Regular tape is ok, allergy is to paper tape    ROS   No other specific complaints in a complete review of systems (except as listed in HPI above).  Objective  Vitals:   01/22/18 1026  BP: 122/78  Pulse: 77  Resp: 16  Temp: 97.7 F (36.5 C)  TempSrc: Oral  SpO2: 96%  Weight: 170 lb 12.8 oz (77.5 kg)  Height: '5\' 6"'$  (1.676 m)    Body mass index is 27.57 kg/m.  Nursing Note and Vital Signs reviewed.  Physical Exam  Constitutional: Patient appears well-developed and well-nourished.  No distress.  Cardiovascular: Normal rate, regular rhythm, S1/S2 present.   Pulmonary/Chest: Effort normal and breath sounds clear. No respiratory distress or retractions. MSK: right knee swelling with healed scar, full ROM; left knee with healed scar, no swelling full rom  Psychiatric: Patient has a normal mood and affect. behavior is normal. Judgment and thought content normal.  No results found for this or any previous visit (from the past 72 hour(s)).  fAssessment & Plan  1. Need for pneumococcal vaccination Office currently out, patient will come in Thursday for nurse visit  and lab draw  - Pneumococcal polysaccharide vaccine 23-valent greater than or equal to 2yo subcutaneous/IM; Future  2. Essential hypertension -stable continue losartan -BMP; future   3. 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   4. Status post right knee replacement - Call and see if you can get earlier appointment for ortho (also request to be put on call list)  - Knee brace, alternate between ice/heat 20 min at a time 4-5 times a day - Tylenol as needed for pain (do not exceed 3,000 mg/day) - Avoid nsaids can use ibuprofen very sparingly for pain   5. Microalbuminuria -cont. Losartan monitor again next visit, discussed need    -Red flags and when to present for emergency care or RTC including fever >101.69F, chest pain, shortness of breath, new/worsening/un-resolving symptoms,  reviewed with patient at time of visit. Follow up and care instructions discussed and provided in AVS. -Reviewed Health Maintenance: ordered pneumovax  --------------------------------------- I have reviewed this encounter including the documentation in this note and/or discussed this patient with the provider, Suezanne Cheshire DNP AGNP-C. I am certifying that I agree with the content of this note as supervising physician. Enid Derry, [facility] 01/29/2018, 1:23 PM

## 2018-02-07 ENCOUNTER — Other Ambulatory Visit: Payer: Self-pay | Admitting: Nurse Practitioner

## 2018-02-07 DIAGNOSIS — E1329 Other specified diabetes mellitus with other diabetic kidney complication: Secondary | ICD-10-CM

## 2018-02-07 DIAGNOSIS — Z794 Long term (current) use of insulin: Principal | ICD-10-CM

## 2018-02-07 DIAGNOSIS — R809 Proteinuria, unspecified: Principal | ICD-10-CM

## 2018-02-26 ENCOUNTER — Ambulatory Visit (INDEPENDENT_AMBULATORY_CARE_PROVIDER_SITE_OTHER): Payer: Medicare HMO | Admitting: Family Medicine

## 2018-02-26 ENCOUNTER — Encounter: Payer: Self-pay | Admitting: Family Medicine

## 2018-02-26 ENCOUNTER — Telehealth: Payer: Self-pay | Admitting: Nurse Practitioner

## 2018-02-26 VITALS — BP 118/74 | HR 76 | Temp 97.9°F | Resp 16 | Ht 66.0 in | Wt 174.4 lb

## 2018-02-26 DIAGNOSIS — R51 Headache: Secondary | ICD-10-CM | POA: Diagnosis not present

## 2018-02-26 DIAGNOSIS — Z820 Family history of epilepsy and other diseases of the nervous system: Secondary | ICD-10-CM | POA: Diagnosis not present

## 2018-02-26 DIAGNOSIS — G8929 Other chronic pain: Secondary | ICD-10-CM

## 2018-02-26 MED ORDER — PROMETHAZINE HCL 25 MG/ML IJ SOLN
12.5000 mg | Freq: Once | INTRAMUSCULAR | Status: AC
  Administered 2018-02-26: 12.5 mg via INTRAMUSCULAR

## 2018-02-26 MED ORDER — KETOROLAC TROMETHAMINE 30 MG/ML IJ SOLN
30.0000 mg | Freq: Once | INTRAMUSCULAR | Status: AC
  Administered 2018-02-26: 30 mg via INTRAMUSCULAR

## 2018-02-26 MED ORDER — PROMETHAZINE HCL 25 MG/ML IJ SOLN: 12.5000 mg | Freq: Once | INTRAMUSCULAR | Status: DC

## 2018-02-26 NOTE — Patient Instructions (Signed)
No ibuprofen for 24 hours after toradol.  If you are not better in 2 days, return for follow up.

## 2018-02-26 NOTE — Progress Notes (Signed)
Name: Lori Potts   MRN: [id number]    DOB: 11/24/1945   Date:02/26/2018       Progress Note  Subjective  Chief Complaint  Chief Complaint  Patient presents with  . Headache    woke up this morning with headache  . Emesis    HPI  PT presents with concern for migraine with nausea and vomiting. She has a history of migraines and says this feels very typical. Headache started last night so she took some ibuprofen, had one episode of vomiting last night; still very nauseous today.  Endorses photosensitivity and fatigue; pain is in the front and back of her head - mostly in the front.  Denies neck pain, fevers, chills, body aches, vision changes, numbness/tingling, weakness. BG's have been averaging 118.  Patient Active Problem List   Diagnosis Date Noted  . Microalbuminuria 01/22/2018  . Anemia 09/22/2017  . S/P knee replacement 07/28/2017  . Allergic rhinitis 05/26/2017  . Vitamin D deficiency 05/19/2017  . Chronic nonintractable headache 05/18/2017  . Special screening for malignant neoplasms, colon   . Gastroesophageal reflux disease   . Gastritis without bleeding   . 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 Insomnia 11/29/2016  . 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 Vasomotor flushing 04/19/2016  . Abdominal wall pain in right flank 03/29/2016  . Diverticulosis 02/24/2016  . Hypercholesterolemia 06/16/2015  . FHx: migraine headaches 04/13/2015  . Chronic pain 04/13/2015  . Neck pain 02/26/2015  . Status post lumbar surgery 02/26/2015  . Abnormal ECG 01/27/2015  . Angina pectoris ([place]) 01/27/2015  . 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 BP (high blood pressure) 01/27/2015    Social History   Tobacco Use  . Smoking status: Former Smoker    Types: Cigarettes    Last attempt to quit: 2006    Years since quitting: 13.5  . Smokeless tobacco: Never Used  . Tobacco comment: quit  smoking 109yr ago  Substance Use Topics  . Alcohol use: No     Current Outpatient Medications:  .  albuterol (PROVENTIL HFA;VENTOLIN HFA) 108 (90 Base) MCG/ACT inhaler, Inhale 2 puffs into the lungs every 4 (four) hours as needed (for wheezing and shortness of breath from seasonal allergies). PROVENTIL, Disp: 3 Inhaler, Rfl: 0 .  albuterol (PROVENTIL) (2.5 MG/3ML) 0.083% nebulizer solution, Take 2.5 mg by nebulization every 6 (six) hours as needed for wheezing or shortness of breath. , Disp: , Rfl:  .  aspirin EC 81 MG tablet, Take 81 mg by mouth daily., Disp: , Rfl:  .  atorvastatin (LIPITOR) 40 MG tablet, TAKE 1 TABLET BY MOUTH EVERYDAY AT BEDTIME, Disp: 90 tablet, Rfl: 0 .  baclofen (LIORESAL) 10 MG tablet, Take 1 tablet (10 mg total) by mouth 3 (three) times daily as needed for muscle spasms., Disp: 50 tablet, Rfl: 1 .  Blood Glucose Calibration (TRUE METRIX LEVEL 1) Low SOLN, Use to check fingerstick blood sugars once a day; LON 99 months; E11.9, Disp: 1 each, Rfl: 1 .  Blood Glucose Monitoring Suppl (TRUE METRIX METER) w/Device KIT, Use to check fingerstick blood sugars once a day; LON 99 months; E11.9, Disp: 1 kit, Rfl: 0 .  fexofenadine (ALLEGRA ALLERGY) 180 MG tablet, Take 1 tablet (180 mg total) by mouth daily. StartTaking after completing Allegra-D medication, Disp: 30 tablet, Rfl: 0 .  fexofenadine-pseudoephedrine (ALLEGRA-D) 60-120 MG 12 hr tablet, Take 1 tablet by mouth every 12 (twelve) hours., Disp:  14 tablet, Rfl: 0 .  glucose blood (ACCU-CHEK AVIVA) test strip, Check fingerstick blood sugars once a day; LON 99 months, Dx E11.9, Disp: 100 each, Rfl: 3 .  ibuprofen (ADVIL,MOTRIN) 600 MG tablet, Take 1 tablet (600 mg total) by mouth every 8 (eight) hours as needed for headache., Disp: 30 tablet, Rfl: 0 .  Insulin Pen Needle (BD ULTRA-FINE PEN NEEDLES) 29G X 12.7MM MISC, For use with injectable GLP-1 inhibitor, Victoza, once a day, Disp: 100 each, Rfl: 3 .  ipratropium-albuterol  (DUONEB) 0.5-2.5 (3) MG/3ML SOLN, TAKE 3 MLS BY NEBULIZATION EVERY 4 (FOUR) HOURS AS NEEDED. (Patient taking differently: Take 3 mLs by nebulization every 4 (four) hours as needed (for shortness of breath or wheezing). ), Disp: 360 mL, Rfl: 3 .  Lancets (FREESTYLE) lancets, Use to check fingerstick blood sugars once a day; LON 99 months; E11.9, Disp: 100 each, Rfl: 3 .  loratadine (CLARITIN) 10 MG tablet, Take 1 tablet (10 mg total) by mouth daily., Disp: 90 tablet, Rfl: 2 .  losartan (COZAAR) 25 MG tablet, TAKE 1 TABLET BY MOUTH EVERY DAY, Disp: 30 tablet, Rfl: 0 .  mometasone (NASONEX) 50 MCG/ACT nasal spray, Place 2 sprays into the nose daily. (Patient taking differently: Place 2 sprays into the nose daily as needed (for allergies). ), Disp: 17 g, Rfl: 0 .  montelukast (SINGULAIR) 10 MG tablet, Take 1 tablet (10 mg total) by mouth at bedtime., Disp: 30 tablet, Rfl: 3 .  omeprazole (PRILOSEC) 20 MG capsule, Take 1 capsule (20 mg total) by mouth 2 (two) times daily before a meal., Disp: , Rfl:  .  Vitamin D, Cholecalciferol, 1000 units TABS, Take 2,000 Units by mouth daily., Disp: , Rfl:   Allergies  Allergen Reactions  . Metformin And Related Diarrhea  . Adhesive [Tape] Rash and Other (See Comments)    Regular tape is ok, allergy is to paper tape    ROS  Ten systems reviewed and is negative except as mentioned in HPI  Objective  Vitals:   02/26/18 1325  BP: 118/74  Pulse: 76  Resp: 16  Temp: 97.9 F (36.6 C)  TempSrc: Oral  SpO2: 96%  Weight: 174 lb 6.4 oz (79.1 kg)  Height: '5\' 6"'$  (1.676 m)   Body mass index is 28.15 kg/m.  Nursing Note and Vital Signs reviewed.  Physical Exam  Constitutional: Patient appears well-developed and well-nourished. Obese. No distress.  HEENT: head atraumatic, normocephalic, pupils equal and reactive to light, EOM's intact Cardiovascular: Normal rate, regular rhythm, S1/S2 present.  No murmur or rub heard. No BLE edema. Pulmonary/Chest: Effort  normal and breath sounds clear. No respiratory distress or retractions. Psychiatric: Patient has a normal mood and affect. behavior is normal. Judgment and thought content normal. Musculoskeletal: Normal range of motion, no joint effusions. No gross deformities Neurological: he is alert and oriented to person, place, and time. No cranial nerve deficit. Coordination, balance, strength, speech and gait are normal.   No results found for this or any previous visit (from the past 72 hour(s)).  Assessment & Plan 1. Chronic nonintractable headache, unspecified headache type - promethazine (PHENERGAN) injection 12.5 mg - ketorolac (TORADOL) 30 MG/ML injection 30 mg - Moist heat, dark room, plenty of rest, stay well hydrated. - Neurologic examination is unremarkable; signs and symptoms of stroke are discussed in detail.  2. FHx: migraine headaches - promethazine (PHENERGAN) injection 12.5 mg - ketorolac (TORADOL) 30 MG/ML injection 30 mg  -Red flags and when to present for emergency  care or RTC including fever >101.55F, chest pain, shortness of breath, new/worsening/un-resolving symptoms, extremity weakness, vision changes, numbness/tingling, or speech changes reviewed with patient at time of visit. Follow up and care instructions discussed and provided in AVS.

## 2018-02-28 NOTE — Telephone Encounter (Signed)
Patient called and stated that she still had a severe headache. Can she get a xray of a referral to have checked

## 2018-02-28 NOTE — Telephone Encounter (Signed)
Patient notified

## 2018-02-28 NOTE — Telephone Encounter (Signed)
Per Irving BurtonEmily NP note no signs indicating need for CT- would not help with diagnosis of migraine. Please have her go to UC or ER to try other medications for breaking her migraine. If having frequent migraines can discuss referral to neurology in future appointment

## 2018-03-01 NOTE — Telephone Encounter (Signed)
Documentation Reviewed.

## 2018-03-05 ENCOUNTER — Telehealth: Payer: Self-pay | Admitting: Nurse Practitioner

## 2018-03-05 ENCOUNTER — Other Ambulatory Visit: Payer: Self-pay | Admitting: Nurse Practitioner

## 2018-03-05 DIAGNOSIS — E1329 Other specified diabetes mellitus with other diabetic kidney complication: Secondary | ICD-10-CM

## 2018-03-05 DIAGNOSIS — Z794 Long term (current) use of insulin: Principal | ICD-10-CM

## 2018-03-05 DIAGNOSIS — R809 Proteinuria, unspecified: Principal | ICD-10-CM

## 2018-03-05 NOTE — Telephone Encounter (Signed)
Patient called.  Patient aware.  

## 2018-03-05 NOTE — Telephone Encounter (Signed)
Please call patient to labs only visit for monitoring kidney function and electrolytes since started on losartan for nephroprotection

## 2018-03-16 ENCOUNTER — Encounter: Payer: Self-pay | Admitting: Nurse Practitioner

## 2018-03-16 ENCOUNTER — Ambulatory Visit (INDEPENDENT_AMBULATORY_CARE_PROVIDER_SITE_OTHER): Payer: Medicare HMO | Admitting: Nurse Practitioner

## 2018-03-16 ENCOUNTER — Other Ambulatory Visit: Payer: Self-pay | Admitting: Family Medicine

## 2018-03-16 VITALS — BP 150/80 | HR 66 | Temp 98.0°F | Resp 16 | Ht 66.0 in | Wt 173.9 lb

## 2018-03-16 DIAGNOSIS — I10 Essential (primary) hypertension: Secondary | ICD-10-CM | POA: Diagnosis not present

## 2018-03-16 DIAGNOSIS — R109 Unspecified abdominal pain: Secondary | ICD-10-CM

## 2018-03-16 DIAGNOSIS — Z23 Encounter for immunization: Secondary | ICD-10-CM | POA: Diagnosis not present

## 2018-03-16 DIAGNOSIS — D649 Anemia, unspecified: Secondary | ICD-10-CM | POA: Diagnosis not present

## 2018-03-16 LAB — IRON,TIBC AND FERRITIN PANEL
%SAT: 30 % (calc) (ref 16–45)
Ferritin: 35 ng/mL (ref 16–288)
Iron: 96 ug/dL (ref 45–160)
TIBC: 318 mcg/dL (calc) (ref 250–450)

## 2018-03-16 LAB — POCT URINALYSIS DIPSTICK
Appearance: NORMAL
Bilirubin, UA: NEGATIVE
Blood, UA: NEGATIVE
Glucose, UA: NEGATIVE
Ketones, UA: NEGATIVE
Leukocytes, UA: NEGATIVE
Nitrite, UA: NEGATIVE
Protein, UA: NEGATIVE
Spec Grav, UA: 1.015 (ref 1.010–1.025)
Urobilinogen, UA: 0.2 E.U./dL
pH, UA: 6 (ref 5.0–8.0)

## 2018-03-16 NOTE — Progress Notes (Addendum)
Name: Lori Potts   MRN: [id number]    DOB: 02-Oct-1945   Date:03/16/2018       Progress Note  Subjective  Chief Complaint  Chief Complaint  Patient presents with  . Back Pain    left flank pain has been gradually worsening over the past few days. She has had pain x 3-4 days.   . Urinary Tract Infection    HPI  Patient endorses left flank pain ongoing or 3-4 days. States pain has progressively gotten worse. Patient has chronic back pain and oxycodone has not been helping with this pain; and muscle relaxer that just made her go to sleep so she wasn't aware of pain. 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 Denies dysuria, hematuria, frequency or hesitancy, fevers or chills . Patient has had kidney stones states does not feel the same to her.    Patient Active Problem List   Diagnosis Date Noted  . Microalbuminuria 01/22/2018  . Anemia 09/22/2017  . S/P knee replacement 07/28/2017  . Allergic rhinitis 05/26/2017  . Vitamin D deficiency 05/19/2017  . Chronic nonintractable headache 05/18/2017  . Special screening for malignant neoplasms, colon   . Gastroesophageal reflux disease   . Gastritis without bleeding   . 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 Insomnia 11/29/2016  . 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 Vasomotor flushing 04/19/2016  . Abdominal wall pain in right flank 03/29/2016  . Diverticulosis 02/24/2016  . Hypercholesterolemia 06/16/2015  . FHx: migraine headaches 04/13/2015  . Chronic pain 04/13/2015  . Neck pain 02/26/2015  . Status post lumbar surgery 02/26/2015  . Abnormal ECG 01/27/2015  . Angina pectoris ([place]) 01/27/2015  . 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 BP (high blood pressure) 01/27/2015    Past Medical History:  Diagnosis Date  . Arthritis   . 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 Cataract    right eye but immature  .  Essential hypertension, benign    takes Lisinopril-HCTZ daily  . GERD (gastroesophageal reflux disease)   . Headache    sinus  . History of colon polyps    benign  . History of migraine   . History of shingles   . Hyperlipidemia    takes Lipitor daily  . Low back pain   . Nocturia   . S/P insertion of spinal cord stimulator   . Seasonal allergies    takes Singulair daily as needed  . Type II or unspecified type diabetes mellitus without mention of complication, not stated as uncontrolled   . Vitamin D deficiency    takes Vit D weekly  . Weakness    numbness and tingling left arm  . Wears dentures    full upper and lower    Past Surgical History:  Procedure Laterality Date  . ABDOMINAL HYSTERECTOMY    . ANTERIOR CERVICAL DECOMP/DISCECTOMY FUSION N/A 02/26/2015   Procedure: ANTERIOR CERVICAL DISCECTOMY FUSION C4-5 (1 LEVEL);  Surgeon: Melina Schools, MD;  Location: [facility];  Service: Orthopedics;  Laterality: N/A;  . ANTERIOR LAT LUMBAR FUSION N/A 03/21/2013   Procedure: ANTERIOR LATERAL LUMBAR FUSION 1 LEVEL/ XLIF L3-L4 ;  Surgeon: Melina Schools, MD;  Location: [facility];  Service: Orthopedics;  Laterality: N/A;  . APPENDECTOMY    . AUGMENTATION MAMMAPLASTY Bilateral 1978  . BACK SURGERY    . BACK SURGERY     Lumbar fusion x 2  . BREAST EXCISIONAL BIOPSY Right  [place]  . CARDIAC CATHETERIZATION  5/12   ef 55%  . CARDIAC CATHETERIZATION  10/2010   [facility]; EF 55%  . CARDIAC CATHETERIZATION Left 02/16/2016   Procedure: Left Heart Cath and Coronary Angiography;  Surgeon: Yolonda Kida, MD;  Location: [facility];  Service: Cardiovascular;  Laterality: Left;  . COLONOSCOPY    . COLONOSCOPY WITH PROPOFOL N/A 04/10/2017   Procedure: COLONOSCOPY WITH PROPOFOL;  Surgeon: Lucilla Lame, MD;  Location: [facility];  Service: Gastroenterology;  Laterality: N/A;  Diabetic - insulin  . ESOPHAGOGASTRODUODENOSCOPY    . [place]    growth removed from left kidney   . pain stimulator    . POSTERIOR CERVICAL FUSION/FORAMINOTOMY Right 03/21/2013   Procedure: POSTERIOR L2-3 RIGHT FORAMINOTOMY;  Surgeon: Melina Schools, MD;  Location: [facility];  Service: Orthopedics;  Laterality: Right;  . REVISION OF SCAR TISSUE RECTUS MUSCLE    . SMALL BOWEL REPAIR    . SPINAL CORD STIMULATOR BATTERY EXCHANGE N/A 10/17/2012   Procedure: SPINAL CORD STIMULATOR BATTERY REMOVAL;  Surgeon: Melina Schools, MD;  Location: [facility];  Service: Orthopedics;  Laterality: N/A;  . SPINAL CORD STIMULATOR BATTERY EXCHANGE N/A 07/22/2015   Procedure: REIMPLANTATION OF SPINAL CORD STIMULATOR BATTERY ;  Surgeon: Melina Schools, MD;  Location: [facility];  Service: Orthopedics;  Laterality: N/A;  . TOTAL KNEE ARTHROPLASTY Left   . TOTAL KNEE ARTHROPLASTY Right 07/28/2017   Procedure: RIGHT TOTAL KNEE ARTHROPLASTY;  Surgeon: Sydnee Cabal, MD;  Location: [facility];  Service: Orthopedics;  Laterality: Right;    Social History   Tobacco Use  . Smoking status: Former Smoker    Types: Cigarettes    Last attempt to quit: 2006    Years since quitting: 13.5  . Smokeless tobacco: Never Used  . Tobacco comment: quit smoking 73yr ago  Substance Use Topics  . Alcohol use: No     Current Outpatient Medications:  .  albuterol (PROVENTIL HFA;VENTOLIN HFA) 108 (90 Base) MCG/ACT inhaler, Inhale 2 puffs into the lungs every 4 (four) hours as needed (for wheezing and shortness of breath from seasonal allergies). PROVENTIL, Disp: 3 Inhaler, Rfl: 0 .  albuterol (PROVENTIL) (2.5 MG/3ML) 0.083% nebulizer solution, Take 2.5 mg by nebulization every 6 (six) hours as needed for wheezing or shortness of breath. , Disp: , Rfl:  .  aspirin EC 81 MG tablet, Take 81 mg by mouth daily., Disp: , Rfl:  .  atorvastatin (LIPITOR) 40 MG tablet, TAKE 1 TABLET BY MOUTH EVERYDAY AT BEDTIME, Disp: 90 tablet, Rfl: 0 .  baclofen (LIORESAL) 10 MG tablet, Take 1 tablet (10 mg total) by mouth 3 (three) times  daily as needed for muscle spasms., Disp: 50 tablet, Rfl: 1 .  Blood Glucose Calibration (TRUE METRIX LEVEL 1) Low SOLN, Use to check fingerstick blood sugars once a day; LON 99 months; E11.9, Disp: 1 each, Rfl: 1 .  Blood Glucose Monitoring Suppl (TRUE METRIX METER) w/Device KIT, Use to check fingerstick blood sugars once a day; LON 99 months; E11.9, Disp: 1 kit, Rfl: 0 .  fexofenadine (ALLEGRA ALLERGY) 180 MG tablet, Take 1 tablet (180 mg total) by mouth daily. StartTaking after completing Allegra-D medication, Disp: 30 tablet, Rfl: 0 .  fexofenadine-pseudoephedrine (ALLEGRA-D) 60-120 MG 12 hr tablet, Take 1 tablet by mouth every 12 (twelve) hours., Disp: 14 tablet, Rfl: 0 .  glucose blood (ACCU-CHEK AVIVA) test strip, Check fingerstick blood sugars once a  day; LON 99 months, Dx E11.9, Disp: 100 each, Rfl: 3 .  ibuprofen (ADVIL,MOTRIN) 600 MG tablet, Take 1 tablet (600 mg total) by mouth every 8 (eight) hours as needed for headache., Disp: 30 tablet, Rfl: 0 .  Insulin Pen Needle (BD ULTRA-FINE PEN NEEDLES) 29G X 12.7MM MISC, For use with injectable GLP-1 inhibitor, Victoza, once a day, Disp: 100 each, Rfl: 3 .  ipratropium-albuterol (DUONEB) 0.5-2.5 (3) MG/3ML SOLN, TAKE 3 MLS BY NEBULIZATION EVERY 4 (FOUR) HOURS AS NEEDED. (Patient taking differently: Take 3 mLs by nebulization every 4 (four) hours as needed (for shortness of breath or wheezing). ), Disp: 360 mL, Rfl: 3 .  Lancets (FREESTYLE) lancets, Use to check fingerstick blood sugars once a day; LON 99 months; E11.9, Disp: 100 each, Rfl: 3 .  loratadine (CLARITIN) 10 MG tablet, Take 1 tablet (10 mg total) by mouth daily., Disp: 90 tablet, Rfl: 2 .  losartan (COZAAR) 25 MG tablet, TAKE 1 TABLET BY MOUTH EVERY DAY, Disp: 30 tablet, Rfl: 0 .  mometasone (NASONEX) 50 MCG/ACT nasal spray, Place 2 sprays into the nose daily. (Patient taking differently: Place 2 sprays into the nose daily as needed (for allergies). ), Disp: 17 g, Rfl: 0 .  montelukast  (SINGULAIR) 10 MG tablet, Take 1 tablet (10 mg total) by mouth at bedtime., Disp: 30 tablet, Rfl: 3 .  omeprazole (PRILOSEC) 20 MG capsule, Take 1 capsule (20 mg total) by mouth 2 (two) times daily before a meal., Disp: , Rfl:  .  oxyCODONE-acetaminophen (PERCOCET) 10-325 MG tablet, Take 10-325 tablets by mouth 5 (five) times daily., Disp: , Rfl: 0 .  Vitamin D, Cholecalciferol, 1000 units TABS, Take 2,000 Units by mouth daily., Disp: , Rfl:   Allergies  Allergen Reactions  . Metformin And Related Diarrhea  . Adhesive [Tape] Rash and Other (See Comments)    Regular tape is ok, allergy is to paper tape    Review of Systems  Constitutional: Negative for chills, fever and malaise/fatigue.  Respiratory: Negative for cough and shortness of breath.   Cardiovascular: Negative for chest pain and palpitations.  Gastrointestinal: Negative for abdominal pain, blood in stool, diarrhea, nausea and vomiting.  Genitourinary: Positive for flank pain. Negative for dysuria, frequency, hematuria and urgency.  Musculoskeletal: Positive for myalgias. Negative for back pain.  Neurological: Negative for dizziness and headaches.     No other specific complaints in a complete review of systems (except as listed in HPI above).  Objective  Vitals:   03/16/18 1023  BP: (!) 150/80  Pulse: 66  Resp: 16  Temp: 98 F (36.7 C)  TempSrc: Oral  SpO2: 98%  Weight: 173 lb 14.4 oz (78.9 kg)  Height: '5\' 6"'$  (1.676 m)    Body mass index is 28.07 kg/m.  Nursing Note and Vital Signs reviewed.  Physical Exam  Constitutional: She is oriented to person, place, and time. She appears well-developed and well-nourished.  Cardiovascular: Normal rate, regular rhythm and normal heart sounds.  Pulmonary/Chest: Effort normal and breath sounds normal.  Abdominal: Soft. Bowel sounds are normal.  + CVA tenderness on left flank   Musculoskeletal: She exhibits tenderness (tenderness to left lower back). She exhibits no  deformity.       Arms: Neurological: She is alert and oriented to person, place, and time.     Results for orders placed or performed in visit on 03/16/18 (from the past 48 hour(s))  POCT Urinalysis Dipstick     Status: Normal   Collection Time:  03/16/18 10:41 AM  Result Value Ref Range   Color, UA yellow    Clarity, UA clear    Glucose, UA Negative Negative   Bilirubin, UA Negative    Ketones, UA Negative    Spec Grav, UA 1.015 1.010 - 1.025   Blood, UA Negative    pH, UA 6.0 5.0 - 8.0   Protein, UA Negative Negative   Urobilinogen, UA 0.2 0.2 or 1.0 E.U./dL   Nitrite, UA Negative    Leukocytes, UA Negative Negative   Appearance normal    Odor none     Assessment & Plan   1. Left flank pain - For your pain continue your pain medication regimen- Using your Muscle relaxer as needed per instructions. Using heat on the area for 20 min 4 times a day. Can also add the lidocaine patch. About 20 minutes late try some light stretching- giving you exercises below.   - If your pain is getting worse and not improving over the next 2-3 days let us know and we can do imaging to look for kidney stone or send flomax to help if it seems appropriate.   - Drink at least 64 ounces of water a day; avoid caffeine for the next 3 days.   - POCT Urinalysis Dipstick - negative for infection, protein or blood.   2. Need for pneumococcal vaccination  - Pneumococcal polysaccharide vaccine 23-valent greater than or equal to 2yo subcutaneous/IM  3. Essential hypertension -elevated- likely due to pain, will recheck in one week   -Red flags and when to present for emergency care or RTC including fever >101.44F, chest pain, shortness of breath, abdominal pain or swelling or vomiting of blood in your urine new/worsening/un-resolving symptoms, reviewed with patient at time of visit. Follow up and care instructions discussed and provided in AVS. -Reviewed Health Maintenance: will get pneumonia vaccine today  and eye doctor appointment next week.   ------------------------------- Urine completely normal today I have reviewed this encounter including the documentation in this note and/or discussed this patient with the provider, Suezanne Cheshire DNP AGNP-C. I am certifying that I agree with the content of this note as supervising physician. Enid Derry, [facility] 03/16/2018, 12:27 PM

## 2018-03-16 NOTE — Patient Instructions (Addendum)
- For your pain continue your pain medication regimen- Using your Muscle relaxer as needed per instructions. Using heat on the area for 20 min 4 times a day. Can also add the lidocaine patch. About 20 minutes late try some light stretching- giving you exercises below.   - If your pain is getting worse and not improving over the next 2-3 days let us know and we can do imaging to look for kidney stone or send flomax to help if it seems appropriate.   - Drink at least 64 ounces of water a day; avoid caffeine for the next 3 days.   Flank Pain Flank pain is pain in your side. The flank is the area of your side between your upper belly (abdomen) and your back. The pain may occur over a short period of time (acute) or may be long-term or come back often (chronic). It may be mild or very bad. Pain in this area can be caused by many different things. Follow these instructions at home:  Rest as told by your doctor.  Drink enough fluid to keep your pee (urine) clear or pale yellow.  Take over-the-counter and prescription medicines only as told by your doctor.  Keep all follow-up visits as told by your doctor. This is important. Contact a doctor if:  Medicine does not help your pain.  You have new symptoms.  Your pain gets worse.  You have a fever.  Your symptoms last longer than 2-3 days. Get help right away if:  Your tummy hurts or is swollen.  You are short of breath.  You feel sick to your stomach (nauseous) and it does not go away.  You cannot stop throwing up (vomiting).  You feel like you will pass out or you do pass out (faint).  You have blood in your pee.  You have a fever and your symptoms suddenly get worse. This information is not intended to replace advice given to you by your health care provider. Make sure you discuss any questions you have with your health care provider. Document Released: 05/17/2008 Document Revised: 04/29/2016 Document Reviewed: 05/12/2015 Elsevier  Interactive Patient Education  2018 [employer]   Back Exercises If you have pain in your back, do these exercises 2-3 times each day or as told by your doctor. When the pain goes away, do the exercises once each day, but repeat the steps more times for each exercise (do more repetitions). If you do not have pain in your back, do these exercises once each day or as told by your doctor. Exercises Single Knee to Chest  Do these steps 3-5 times in a row for each leg: 1. Lie on your back on a firm bed or the floor with your legs stretched out. 2. Bring one knee to your chest. 3. Hold your knee to your chest by grabbing your knee or thigh. 4. Pull on your knee until you feel a gentle stretch in your lower back. 5. Keep doing the stretch for 10-30 seconds. 6. Slowly let go of your leg and straighten it.  Pelvic Tilt  Do these steps 5-10 times in a row: 1. Lie on your back on a firm bed or the floor with your legs stretched out. 2. Bend your knees so they point up to the ceiling. Your feet should be flat on the floor. 3. Tighten your lower belly (abdomen) muscles to press your lower back against the floor. This will make your tailbone point up to the ceiling  instead of pointing down to your feet or the floor. 4. Stay in this position for 5-10 seconds while you gently tighten your muscles and breathe evenly.  Cat-Cow  Do these steps until your lower back bends more easily: 1. Get on your hands and knees on a firm surface. Keep your hands under your shoulders, and keep your knees under your hips. You may put padding under your knees. 2. Let your head hang down, and make your tailbone point down to the floor so your lower back is round like the back of a cat. 3. Stay in this position for 5 seconds. 4. Slowly lift your head and make your tailbone point up to the ceiling so your back hangs low (sags) like the back of a cow. 5. Stay in this position for 5 seconds.  Press-Ups  Do these steps  5-10 times in a row: 1. Lie on your belly (face-down) on the floor. 2. Place your hands near your head, about shoulder-width apart. 3. While you keep your back relaxed and keep your hips on the floor, slowly straighten your arms to raise the top half of your body and lift your shoulders. Do not use your back muscles. To make yourself more comfortable, you may change where you place your hands. 4. Stay in this position for 5 seconds. 5. Slowly return to lying flat on the floor.  Bridges  Do these steps 10 times in a row: 1. Lie on your back on a firm surface. 2. Bend your knees so they point up to the ceiling. Your feet should be flat on the floor. 3. Tighten your butt muscles and lift your butt off of the floor until your waist is almost as high as your knees. If you do not feel the muscles working in your butt and the back of your thighs, slide your feet 1-2 inches farther away from your butt. 4. Stay in this position for 3-5 seconds. 5. Slowly lower your butt to the floor, and let your butt muscles relax.  If this exercise is too easy, try doing it with your arms crossed over your chest. Belly Crunches  Do these steps 5-10 times in a row: 1. Lie on your back on a firm bed or the floor with your legs stretched out. 2. Bend your knees so they point up to the ceiling. Your feet should be flat on the floor. 3. Cross your arms over your chest. 4. Tip your chin a little bit toward your chest but do not bend your neck. 5. Tighten your belly muscles and slowly raise your chest just enough to lift your shoulder blades a tiny bit off of the floor. 6. Slowly lower your chest and your head to the floor.  Back Lifts Do these steps 5-10 times in a row: 1. Lie on your belly (face-down) with your arms at your sides, and rest your forehead on the floor. 2. Tighten the muscles in your legs and your butt. 3. Slowly lift your chest off of the floor while you keep your hips on the floor. Keep the back of  your head in line with the curve in your back. Look at the floor while you do this. 4. Stay in this position for 3-5 seconds. 5. Slowly lower your chest and your face to the floor.  Contact a doctor if:  Your back pain gets a lot worse when you do an exercise.  Your back pain does not lessen 2 hours after you exercise. If  you have any of these problems, stop doing the exercises. Do not do them again unless your doctor says it is okay. Get help right away if:  You have sudden, very bad back pain. If this happens, stop doing the exercises. Do not do them again unless your doctor says it is okay. This information is not intended to replace advice given to you by your health care provider. Make sure you discuss any questions you have with your health care provider. Document Released: 09/10/2010 Document Revised: 01/14/2016 Document Reviewed: 10/02/2014 Elsevier Interactive Patient Education  [employer].

## 2018-03-26 ENCOUNTER — Other Ambulatory Visit: Payer: Self-pay | Admitting: Family Medicine

## 2018-03-26 DIAGNOSIS — E7800 Pure hypercholesterolemia, unspecified: Secondary | ICD-10-CM

## 2018-03-26 DIAGNOSIS — I10 Essential (primary) hypertension: Secondary | ICD-10-CM

## 2018-03-27 NOTE — Telephone Encounter (Signed)
Pt.notified

## 2018-03-27 NOTE — Telephone Encounter (Signed)
Lab Results  Component Value Date   CHOL 206 (H) 05/26/2017   HDL 67 05/26/2017   LDLCALC 124 (H) 05/26/2017   TRIG 61 05/26/2017   CHOLHDL 3.1 05/26/2017   Patient needs a cholesterol checked (I ordered in February), as well as the BMP that Lanora ManisElizabeth ordered Please ask patient to get these overdue labs

## 2018-03-28 ENCOUNTER — Other Ambulatory Visit: Payer: Self-pay | Admitting: Nurse Practitioner

## 2018-03-28 DIAGNOSIS — Z96651 Presence of right artificial knee joint: Secondary | ICD-10-CM | POA: Diagnosis not present

## 2018-03-28 DIAGNOSIS — Z471 Aftercare following joint replacement surgery: Secondary | ICD-10-CM | POA: Diagnosis not present

## 2018-03-28 DIAGNOSIS — M25561 Pain in right knee: Secondary | ICD-10-CM | POA: Diagnosis not present

## 2018-03-28 DIAGNOSIS — R809 Proteinuria, unspecified: Principal | ICD-10-CM

## 2018-03-28 DIAGNOSIS — Z96653 Presence of artificial knee joint, bilateral: Secondary | ICD-10-CM | POA: Diagnosis not present

## 2018-03-28 DIAGNOSIS — G8929 Other chronic pain: Secondary | ICD-10-CM | POA: Diagnosis not present

## 2018-03-28 DIAGNOSIS — Z794 Long term (current) use of insulin: Principal | ICD-10-CM

## 2018-03-28 DIAGNOSIS — E1329 Other specified diabetes mellitus with other diabetic kidney complication: Secondary | ICD-10-CM

## 2018-04-22 ENCOUNTER — Other Ambulatory Visit: Payer: Self-pay | Admitting: Family Medicine

## 2018-04-22 DIAGNOSIS — E7800 Pure hypercholesterolemia, unspecified: Secondary | ICD-10-CM

## 2018-04-24 DIAGNOSIS — K219 Gastro-esophageal reflux disease without esophagitis: Secondary | ICD-10-CM | POA: Diagnosis not present

## 2018-04-24 DIAGNOSIS — I208 Other forms of angina pectoris: Secondary | ICD-10-CM | POA: Diagnosis not present

## 2018-04-24 DIAGNOSIS — R9431 Abnormal electrocardiogram [ECG] [EKG]: Secondary | ICD-10-CM | POA: Diagnosis not present

## 2018-04-24 DIAGNOSIS — E785 Hyperlipidemia, unspecified: Secondary | ICD-10-CM | POA: Diagnosis not present

## 2018-04-24 DIAGNOSIS — R0602 Shortness of breath: Secondary | ICD-10-CM | POA: Diagnosis not present

## 2018-04-24 DIAGNOSIS — Z87891 Personal history of nicotine dependence: Secondary | ICD-10-CM | POA: Diagnosis not present

## 2018-04-24 DIAGNOSIS — R079 Chest pain, unspecified: Secondary | ICD-10-CM | POA: Diagnosis not present

## 2018-04-24 DIAGNOSIS — E119 Type 2 diabetes mellitus without complications: Secondary | ICD-10-CM | POA: Diagnosis not present

## 2018-04-24 DIAGNOSIS — I10 Essential (primary) hypertension: Secondary | ICD-10-CM | POA: Diagnosis not present

## 2018-04-24 NOTE — Telephone Encounter (Signed)
Last:7/26 Next 10/02

## 2018-04-25 ENCOUNTER — Other Ambulatory Visit: Payer: Self-pay | Admitting: Nurse Practitioner

## 2018-04-25 DIAGNOSIS — R809 Proteinuria, unspecified: Principal | ICD-10-CM

## 2018-04-25 DIAGNOSIS — E1329 Other specified diabetes mellitus with other diabetic kidney complication: Secondary | ICD-10-CM

## 2018-04-25 DIAGNOSIS — Z794 Long term (current) use of insulin: Principal | ICD-10-CM

## 2018-05-01 ENCOUNTER — Ambulatory Visit: Payer: Medicare HMO | Admitting: Family Medicine

## 2018-05-04 ENCOUNTER — Other Ambulatory Visit: Payer: Self-pay | Admitting: Family Medicine

## 2018-05-04 DIAGNOSIS — G43019 Migraine without aura, intractable, without status migrainosus: Secondary | ICD-10-CM

## 2018-05-04 DIAGNOSIS — R809 Proteinuria, unspecified: Secondary | ICD-10-CM

## 2018-05-04 DIAGNOSIS — E1329 Other specified diabetes mellitus with other diabetic kidney complication: Secondary | ICD-10-CM

## 2018-05-04 DIAGNOSIS — Z794 Long term (current) use of insulin: Secondary | ICD-10-CM

## 2018-05-04 DIAGNOSIS — E7800 Pure hypercholesterolemia, unspecified: Secondary | ICD-10-CM

## 2018-05-04 NOTE — Telephone Encounter (Signed)
Sorry, she needs her labs done She was put on losartan and needed f/u BMP done (see orders) She needs cholesterol panel drawn (in orders) Once we see those results, we'll send 90 day prescriptions if appropriate

## 2018-05-04 NOTE — Telephone Encounter (Signed)
Please send for 90 day new pharmacy

## 2018-05-04 NOTE — Telephone Encounter (Signed)
Copied from CRM [id number]. Topic: Quick Communication - See Telephone Encounter >> May 04, 2018  2:37 PM Windy KalataMichael, Arkie Tagliaferro L, NT wrote: CRM for notification. See Telephone encounter for: 05/04/18.  Gala RomneyDoug is calling from [facility] and requesting refills on the following medications.  atorvastatin (LIPITOR) 40 MG tablet ibuprofen (ADVIL,MOTRIN) 600 MG tablet losartan (COZAAR) 25 MG tablet  [facility] [place] [address] [place] [id number] Phone: [phone] Fax: [phone]

## 2018-05-07 NOTE — Telephone Encounter (Signed)
Left detailed voicemail

## 2018-05-09 ENCOUNTER — Ambulatory Visit (INDEPENDENT_AMBULATORY_CARE_PROVIDER_SITE_OTHER): Payer: Medicare HMO | Admitting: Family Medicine

## 2018-05-09 ENCOUNTER — Encounter: Payer: Self-pay | Admitting: Family Medicine

## 2018-05-09 VITALS — BP 138/76 | HR 84 | Temp 97.9°F | Resp 12 | Ht 66.0 in | Wt 176.6 lb

## 2018-05-09 DIAGNOSIS — Z1231 Encounter for screening mammogram for malignant neoplasm of breast: Secondary | ICD-10-CM

## 2018-05-09 DIAGNOSIS — I10 Essential (primary) hypertension: Secondary | ICD-10-CM

## 2018-05-09 DIAGNOSIS — R809 Proteinuria, unspecified: Secondary | ICD-10-CM

## 2018-05-09 DIAGNOSIS — Z794 Long term (current) use of insulin: Secondary | ICD-10-CM

## 2018-05-09 DIAGNOSIS — I700 Atherosclerosis of aorta: Secondary | ICD-10-CM | POA: Diagnosis not present

## 2018-05-09 DIAGNOSIS — D709 Neutropenia, unspecified: Secondary | ICD-10-CM

## 2018-05-09 DIAGNOSIS — Z5181 Encounter for therapeutic drug level monitoring: Secondary | ICD-10-CM

## 2018-05-09 DIAGNOSIS — E559 Vitamin D deficiency, unspecified: Secondary | ICD-10-CM

## 2018-05-09 DIAGNOSIS — Z1239 Encounter for other screening for malignant neoplasm of breast: Secondary | ICD-10-CM

## 2018-05-09 DIAGNOSIS — Z23 Encounter for immunization: Secondary | ICD-10-CM

## 2018-05-09 DIAGNOSIS — J309 Allergic rhinitis, unspecified: Secondary | ICD-10-CM

## 2018-05-09 DIAGNOSIS — E7800 Pure hypercholesterolemia, unspecified: Secondary | ICD-10-CM

## 2018-05-09 DIAGNOSIS — E0829 Diabetes mellitus due to underlying condition with other diabetic kidney complication: Secondary | ICD-10-CM

## 2018-05-09 MED ORDER — MONTELUKAST SODIUM 10 MG PO TABS: 10.0000 mg | ORAL_TABLET | Freq: Every day | ORAL | 11 refills | Status: DC

## 2018-05-09 NOTE — Assessment & Plan Note (Signed)
controlled 

## 2018-05-09 NOTE — Progress Notes (Signed)
BP 138/76   Pulse 84   Temp 97.9 F (36.6 C) (Oral)   Resp 12   Ht 5\' 6"  (1.676 m)   Wt 176 lb 9.6 oz (80.1 kg)   SpO2 99%   BMI 28.50 kg/m    Subjective:    Patient ID: Lori Potts, female    DOB: 10-24-1945, 72 y.o.   MRN: [id number]  HPI: Lori Potts is a 72 y.o. female  Chief Complaint  Patient presents with  . Follow-up    HPI Patient is here for f/u  Type 2 diabetes; checking FSBS 4-5 times a day; range 101-120 or so in the last week; no new foot problems; she'll see [facility] for eye exam soon; tries to watch her diet Lab Results  Component Value Date   HGBA1C 6.5 (H) 01/09/2018  urine microalbumin:Cr   High cholesterol; grills more than fries; did have fried chicken the other day in vegetable oil; had just two wings, other vegetables; taking statin at night; aortic athero on scan previously Lab Results  Component Value Date   CHOL 206 (H) 05/26/2017   HDL 67 05/26/2017   LDLCALC 124 (H) 05/26/2017   TRIG 61 05/26/2017   CHOLHDL 3.1 05/26/2017   She has been under a lot of stress lately; lots of family issues; seeing Dr. Juliann Pares; stress test on Sept 30th  HM list reviewed; mammogram ordered; tetanus if injured  Allergic rhinitis; needs singulair; uses anthistamine and nasal spray when needed  Depression screen [facility] 2/9 05/09/2018 10/09/2017 09/22/2017 09/07/2017 06/28/2017  Decreased Interest 0 0 0 0 0  Down, Depressed, Hopeless 0 0 0 1 1  PHQ - 2 Score 0 0 0 1 1  Altered sleeping 1 - - - -  Tired, decreased energy 0 - - - -  Change in appetite 0 - - - -  Feeling bad or failure about yourself  0 - - - -  Trouble concentrating 0 - - - -  Moving slowly or fidgety/restless 0 - - - -  Suicidal thoughts 0 - - - -  PHQ-9 Score 1 - - - -  Difficult doing work/chores Not difficult at all - - - -  Some recent data might be hidden    Relevant past medical, surgical, family and social history reviewed Past Medical History:  Diagnosis Date  .  Arthritis   . 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 Cataract    right eye but immature  . Essential hypertension, benign    takes Lisinopril-HCTZ daily  . GERD (gastroesophageal reflux disease)   . Headache    sinus  . History of colon polyps    benign  . History of migraine   . History of shingles   . Hyperlipidemia    takes Lipitor daily  . Low back pain   . Nocturia   . S/P insertion of spinal cord stimulator   . Seasonal allergies    takes Singulair daily as needed  . Type II or unspecified type diabetes mellitus without mention of complication, not stated as uncontrolled   . Vitamin D deficiency    takes Vit D weekly  . Weakness    numbness and tingling left arm  . Wears dentures    full upper and lower   Past Surgical History:  Procedure Laterality Date  . ABDOMINAL HYSTERECTOMY    . ANTERIOR CERVICAL DECOMP/DISCECTOMY FUSION N/A 02/26/2015   Procedure: ANTERIOR CERVICAL DISCECTOMY FUSION C4-5 (1 LEVEL);  Surgeon:  Venita Lickahari Brooks, MD;  [facility];  Service: Orthopedics;  Laterality: N/A;  . ANTERIOR LAT LUMBAR FUSION N/A 03/21/2013   Procedure: ANTERIOR LATERAL LUMBAR FUSION 1 LEVEL/ XLIF L3-L4 ;  Surgeon: Venita Lickahari Brooks, MD;  Location: [facility] OR;  Service: Orthopedics;  Laterality: N/A;  . APPENDECTOMY    . AUGMENTATION MAMMAPLASTY Bilateral 1978  . BACK SURGERY    . BACK SURGERY     Lumbar fusion x 2  . BREAST EXCISIONAL BIOPSY Right 1970  . BREAST SURGERY  1990   Augementation  . CARDIAC CATHETERIZATION  5/12   ef 55%  . CARDIAC CATHETERIZATION  10/2010   [facility]; EF 55%  . CARDIAC CATHETERIZATION Left 02/16/2016   Procedure: Left Heart Cath and Coronary Angiography;  Surgeon: Alwyn Peawayne D Callwood, MD;  Location: [facility] INVASIVE CV LAB;  Service: Cardiovascular;  Laterality: Left;  . COLONOSCOPY    . COLONOSCOPY WITH PROPOFOL N/A 04/10/2017   Procedure: COLONOSCOPY WITH PROPOFOL;  Surgeon: Midge MiniumWohl, Darren, MD;  [facility];  Service: Gastroenterology;   Laterality: N/A;  Diabetic - insulin  . ESOPHAGOGASTRODUODENOSCOPY    . KIDNEY SURGERY  1998   growth removed from left kidney   . pain stimulator    . POSTERIOR CERVICAL FUSION/FORAMINOTOMY Right 03/21/2013   Procedure: POSTERIOR L2-3 RIGHT FORAMINOTOMY;  Surgeon: Venita Lickahari Brooks, MD;  Location: [facility] OR;  Service: Orthopedics;  Laterality: Right;  . REVISION OF SCAR TISSUE RECTUS MUSCLE    . SMALL BOWEL REPAIR    . SPINAL CORD STIMULATOR BATTERY EXCHANGE N/A 10/17/2012   Procedure: SPINAL CORD STIMULATOR BATTERY REMOVAL;  Surgeon: Venita Lickahari Brooks, MD;  Location: [facility] OR;  Service: Orthopedics;  Laterality: N/A;  . SPINAL CORD STIMULATOR BATTERY EXCHANGE N/A 07/22/2015   Procedure: REIMPLANTATION OF SPINAL CORD STIMULATOR BATTERY ;  Surgeon: Venita Lickahari Brooks, MD;  Location: [facility] OR;  Service: Orthopedics;  Laterality: N/A;  . TOTAL KNEE ARTHROPLASTY Left   . TOTAL KNEE ARTHROPLASTY Right 07/28/2017   Procedure: RIGHT TOTAL KNEE ARTHROPLASTY;  Surgeon: Eugenia Mcalpineollins, Robert, MD;  Location: [facility] ORS;  Service: Orthopedics;  Laterality: Right;   Family History  Problem Relation Age of Onset  . Heart attack Mother   . Sarcoidosis Sister    Social History   Tobacco Use  . Smoking status: Former Smoker    Types: Cigarettes    Last attempt to quit: 2006    Years since quitting: 13.7  . Smokeless tobacco: Never Used  . Tobacco comment: quit smoking 3723yrs ago  Substance Use Topics  . Alcohol use: No  . Drug use: No    Interim medical history since last visit reviewed. Allergies and medications reviewed  Review of Systems  Constitutional: Negative for unexpected weight change.  Musculoskeletal: Positive for arthralgias (knee pain, right mostly; enlarged; seeing ortho).   Per HPI unless specifically indicated above     Objective:    BP 138/76   Pulse 84   Temp 97.9 F (36.6 C) (Oral)   Resp 12   Ht 5\' 6"  (1.676 m)   Wt 176 lb 9.6 oz (80.1 kg)   SpO2 99%   BMI 28.50 kg/m   Wt Readings from Last  3 Encounters:  05/09/18 176 lb 9.6 oz (80.1 kg)  03/16/18 173 lb 14.4 oz (78.9 kg)  02/26/18 174 lb 6.4 oz (79.1 kg)    Physical Exam  Constitutional: She appears well-developed and well-nourished. No distress.  HENT:  Head: Normocephalic and atraumatic.  Eyes: EOM are normal. No scleral icterus.  Neck: No thyromegaly present.  Cardiovascular: Normal rate, regular rhythm and normal heart sounds.  No murmur heard. Pulmonary/Chest: Effort normal and breath sounds normal. No respiratory distress. She has no wheezes.  Abdominal: Soft. Bowel sounds are normal. She exhibits no distension.  Musculoskeletal: She exhibits no edema.  Neurological: She is alert.  Skin: Skin is warm and dry. She is not diaphoretic. No pallor.  Psychiatric: She has a normal mood and affect. Her behavior is normal. Judgment and thought content normal.   Diabetic Foot Form - Detailed   Diabetic Foot Exam - detailed Diabetic Foot exam was performed with the following findings:  Yes 05/09/2018  9:27 AM  Visual Foot Exam completed.:  Yes  Pulse Foot Exam completed.:  Yes  Right Dorsalis Pedis:  Present Left Dorsalis Pedis:  Present  Sensory Foot Exam Completed.:  Yes Semmes-Weinstein Monofilament Test R Site 1-Great Toe:  Pos L Site 1-Great Toe:  Pos    Comments:  Mole has not changed for decades on right instep     Results for orders placed or performed in visit on 03/16/18  Iron, TIBC and Ferritin Panel  Result Value Ref Range   Iron 96 45 - 160 mcg/dL   TIBC 045 409 - 811 mcg/dL (calc)   %SAT 30 16 - 45 % (calc)   Ferritin 35 16 - 288 ng/mL      Assessment & Plan:   Problem List Items Addressed This Visit      Cardiovascular and Mediastinum   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HCC)    Discussed; goal LDL less than 70      BP (high blood pressure)    controlled        Respiratory   Allergic rhinitis    Refilled the singulair; use antihistamine and nasal spray when needed        Endocrine    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) - Primary   Relevant Orders   Microalbumin / creatinine urine ratio   Lipid panel   Hemoglobin A1c     Other   Vitamin D deficiency    Check level      Relevant Orders   VITAMIN D 25 Hydroxy (Vit-D Deficiency, Fractures)   Microalbuminuria    Discussed with patient; last level 298; refer to nephro if >300      Relevant Orders   Microalbumin / creatinine urine ratio   Hypercholesterolemia    Check lipids; goal LDL lessl than 70      Relevant Orders   Lipid panel    Other Visit Diagnoses    Neutropenia, unspecified type (HCC)       Relevant Orders   CBC with Differential/Platelet   Medication monitoring encounter       Relevant Orders   COMPLETE METABOLIC PANEL WITH GFR   Need for influenza vaccination       Relevant Orders   Flu vaccine HIGH DOSE PF (Fluzone High dose) (Completed)   Screening for breast cancer       Relevant Orders   MM 3D SCREEN BREAST BILATERAL       Follow up plan: Return in about 3 months (around 08/08/2018) for follow-up visit with Dr. Sherie Don.  An after-visit summary was printed and given to the patient at check-out.  Please see the patient instructions which may contain other information and recommendations beyond what is mentioned above in the assessment and plan.  Meds ordered this encounter  Medications  . montelukast (SINGULAIR) 10  MG tablet    Sig: Take 1 tablet (10 mg total) by mouth at bedtime.    Dispense:  30 tablet    Refill:  11    Orders Placed This Encounter  Procedures  . MM 3D SCREEN BREAST BILATERAL  . Flu vaccine HIGH DOSE PF (Fluzone High dose)  . Microalbumin / creatinine urine ratio  . Lipid panel  . Hemoglobin A1c  . COMPLETE METABOLIC PANEL WITH GFR  . CBC with Differential/Platelet  . VITAMIN D 25 Hydroxy (Vit-D Deficiency, Fractures)

## 2018-05-09 NOTE — Assessment & Plan Note (Signed)
Refilled the singulair; use antihistamine and nasal spray when needed

## 2018-05-09 NOTE — Assessment & Plan Note (Signed)
Discussed with patient; last level 298; refer to nephro if >300

## 2018-05-09 NOTE — Assessment & Plan Note (Signed)
Check level 

## 2018-05-09 NOTE — Patient Instructions (Addendum)
Please do see your eye doctor regularly, and have your eyes examined every year (or more often per his or her recommendation) Check your feet every night and let me know right away of any sores, infections, numbness, etc. Try to limit sweets, white bread, white rice, white potatoes  Try to follow the DASH guidelines (DASH stands for Dietary Approaches to Stop Hypertension). Try to limit the sodium in your diet to no more than 1,500mg  of sodium per day. Certainly try to not exceed 2,000 mg per day at the very most. Do not add salt when cooking or at the table.  Check the sodium amount on labels when shopping, and choose items lower in sodium when given a choice. Avoid or limit foods that already contain a lot of sodium. Eat a diet rich in fruits and vegetables and whole grains, and try to lose weight if overweight or obese  Try to limit saturated fats in your diet (bologna, hot dogs, barbeque, cheeseburgers, hamburgers, steak, bacon, sausage, cheese, etc.) and get more fresh fruits, vegetables, and whole grains

## 2018-05-09 NOTE — Assessment & Plan Note (Signed)
Check lipids; goal LDL lessl than 70

## 2018-05-09 NOTE — Assessment & Plan Note (Signed)
Discussed; goal LDL less than 70

## 2018-05-10 LAB — HEMOGLOBIN A1C
Hgb A1c MFr Bld: 6.3 % of total Hgb — ABNORMAL HIGH (ref <5.7)
Mean Plasma Glucose: 134 (calc)
eAG (mmol/L): 7.4 (calc)

## 2018-05-10 LAB — COMPLETE METABOLIC PANEL WITH GFR
AG Ratio: 1.6 (calc) (ref 1.0–2.5)
ALT: 17 U/L (ref 6–29)
AST: 21 U/L (ref 10–35)
Albumin: 4.2 g/dL (ref 3.6–5.1)
Alkaline phosphatase (APISO): 99 U/L (ref 33–130)
BUN: 19 mg/dL (ref 7–25)
CO2: 26 mmol/L (ref 20–32)
Calcium: 9.8 mg/dL (ref 8.6–10.4)
Chloride: 106 mmol/L (ref 98–110)
Creat: 0.76 mg/dL (ref 0.60–0.93)
GFR, Est African American: 91 mL/min/{1.73_m2} (ref >=60)
GFR, Est Non African American: 78 mL/min/{1.73_m2} (ref >=60)
Globulin: 2.7 g/dL (calc) (ref 1.9–3.7)
Glucose, Bld: 122 mg/dL (ref 65–139)
Potassium: 4 mmol/L (ref 3.5–5.3)
Sodium: 141 mmol/L (ref 135–146)
Total Bilirubin: 0.4 mg/dL (ref 0.2–1.2)
Total Protein: 6.9 g/dL (ref 6.1–8.1)

## 2018-05-10 LAB — CBC WITH DIFFERENTIAL/PLATELET
Basophils Absolute: 41 cells/uL (ref 0–200)
Basophils Relative: 1 %
Eosinophils Absolute: 180 cells/uL (ref 15–500)
Eosinophils Relative: 4.4 %
HCT: 37.8 % (ref 35.0–45.0)
Hemoglobin: 12.6 g/dL (ref 11.7–15.5)
Lymphs Abs: 918 cells/uL (ref 850–3900)
MCH: 26.2 pg — ABNORMAL LOW (ref 27.0–33.0)
MCHC: 33.3 g/dL (ref 32.0–36.0)
MCV: 78.6 fL — ABNORMAL LOW (ref 80.0–100.0)
MPV: 10.9 fL (ref 7.5–12.5)
Monocytes Relative: 7.3 %
Neutro Abs: 2661 cells/uL (ref 1500–7800)
Neutrophils Relative %: 64.9 %
Platelets: 348 10*3/uL (ref 140–400)
RBC: 4.81 10*6/uL (ref 3.80–5.10)
RDW: 14 % (ref 11.0–15.0)
Total Lymphocyte: 22.4 %
WBC mixed population: 299 cells/uL (ref 200–950)
WBC: 4.1 10*3/uL (ref 3.8–10.8)

## 2018-05-10 LAB — LIPID PANEL
Cholesterol: 175 mg/dL (ref <200)
HDL: 61 mg/dL (ref >50)
LDL Cholesterol (Calc): 98 mg/dL (calc)
Non-HDL Cholesterol (Calc): 114 mg/dL (calc) (ref <130)
Total CHOL/HDL Ratio: 2.9 (calc) (ref <5.0)
Triglycerides: 71 mg/dL (ref <150)

## 2018-05-10 LAB — MICROALBUMIN / CREATININE URINE RATIO
Creatinine, Urine: 81 mg/dL (ref 20–275)
Microalb Creat Ratio: 47 mcg/mg creat — ABNORMAL HIGH (ref <30)
Microalb, Ur: 3.8 mg/dL

## 2018-05-10 LAB — VITAMIN D 25 HYDROXY (VIT D DEFICIENCY, FRACTURES): Vit D, 25-Hydroxy: 24 ng/mL — ABNORMAL LOW (ref 30–100)

## 2018-05-15 ENCOUNTER — Other Ambulatory Visit: Payer: Self-pay | Admitting: Family Medicine

## 2018-05-15 MED ORDER — ATORVASTATIN CALCIUM 80 MG PO TABS: 80.0000 mg | ORAL_TABLET | Freq: Every day | ORAL | 0 refills | Status: DC

## 2018-05-15 NOTE — Progress Notes (Signed)
Increase statin

## 2018-05-16 ENCOUNTER — Telehealth: Payer: Self-pay

## 2018-05-16 DIAGNOSIS — E7800 Pure hypercholesterolemia, unspecified: Secondary | ICD-10-CM

## 2018-05-16 NOTE — Telephone Encounter (Signed)
-----   Message from Kerman Passey, MD sent at 05/15/2018  4:32 PM EDT ----- Please let pt know that she is still spilling protein through her kidneys, but it has improved (good news); her cholesterol is better (LDL down from 124 to 98), so she's headed in the right direction but not quite to target; let's increase the atorvastatin from 40 mg to 80 mg at bedtime, new Rx sent; recheck lipids in 6 weeks, please ORDER; A1c shows good control of diabetes; vitamin D low, so please add extra 2,000 iu of vit D3 daily for the next 2-3 months, then drop down to just 1,000 iu daily

## 2018-05-17 IMAGING — CT CT ABD-PELV W/ CM
2 of 5 series · 15 of 46 positions shown, 17 images · IV contrast (APPLIED)
Comparison: CT of the abdomen and pelvis performed 07/05/2011

CLINICAL DATA: Acute onset of constipation and lower mid abdominal
pressure. Mucus in stools. Initial encounter.

EXAM:
CT ABDOMEN AND PELVIS WITH CONTRAST
TECHNIQUE: Multidetector CT imaging of the abdomen and pelvis was performed
using the standard protocol following bolus administration of
intravenous contrast.
CONTRAST:  100mL 38HMSJ-166 IOPAMIDOL ([id number]) INJECTION 61%

[Series 2: routine abd/pel with · axial · 0.90mm/px · z∈[-880,-485]mm · 12 of 89 slices shown, 14 images]
[im 5/89  soft-tissue]
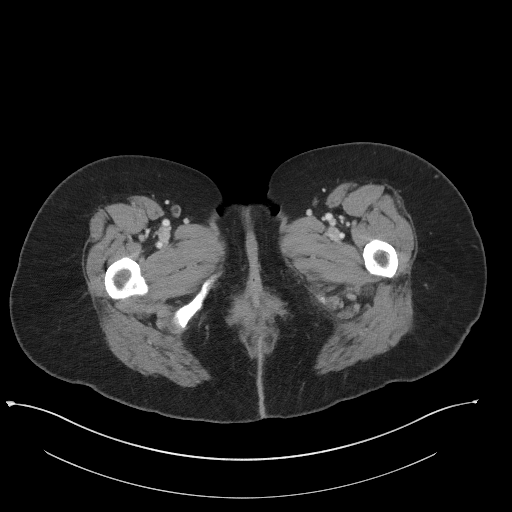
[im 5/89  bone]
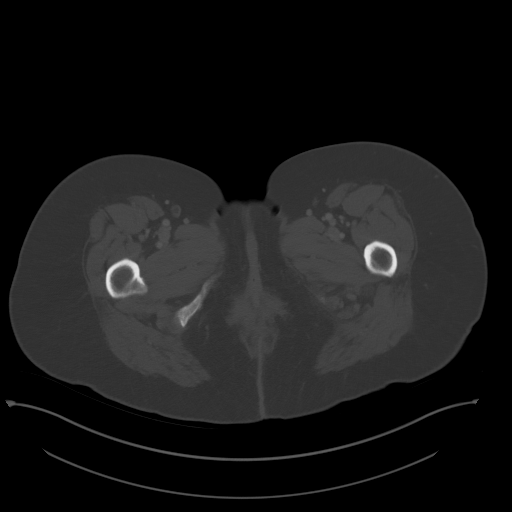
[im 14/89  soft-tissue]
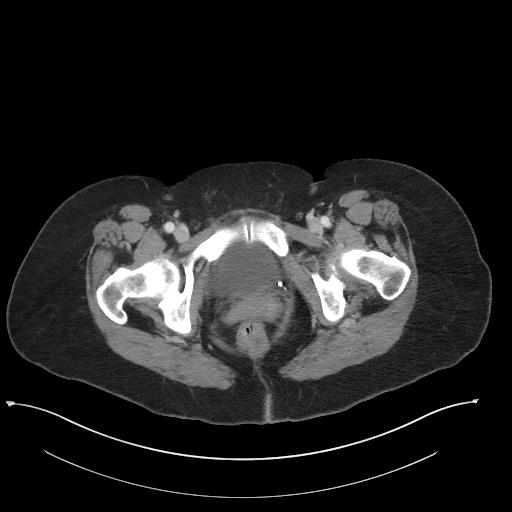
[im 18/89  soft-tissue]
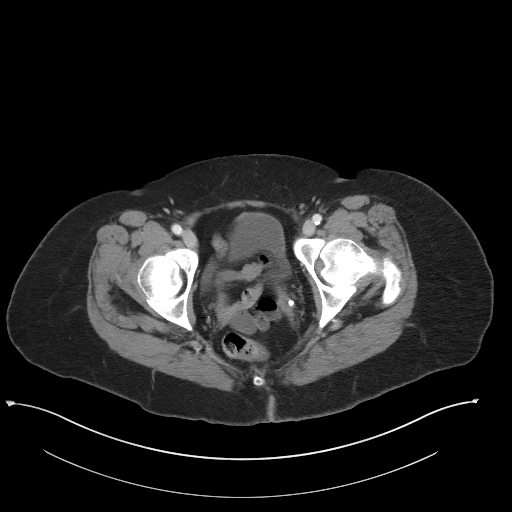
[im 27/89  soft-tissue]
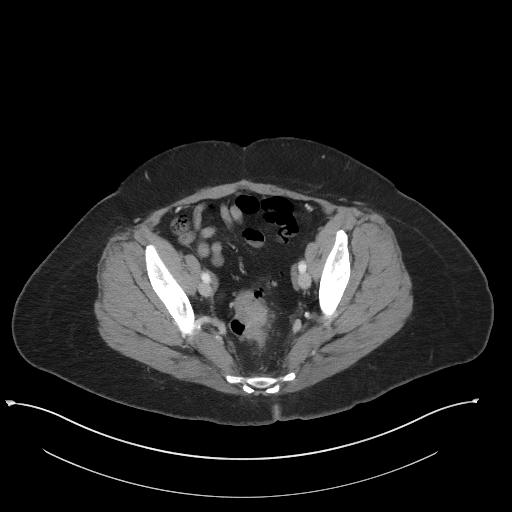
[im 36/89  soft-tissue]
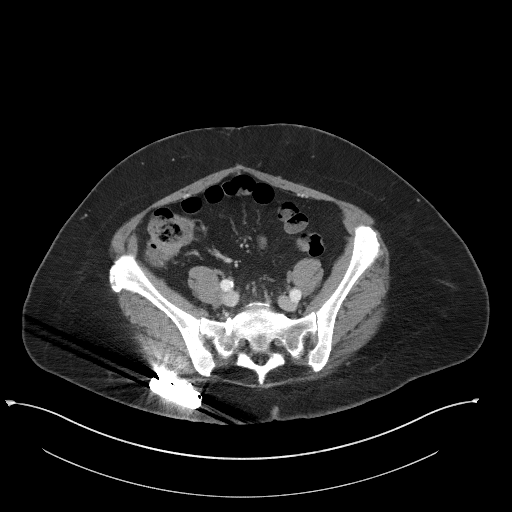
[im 40/89  soft-tissue]
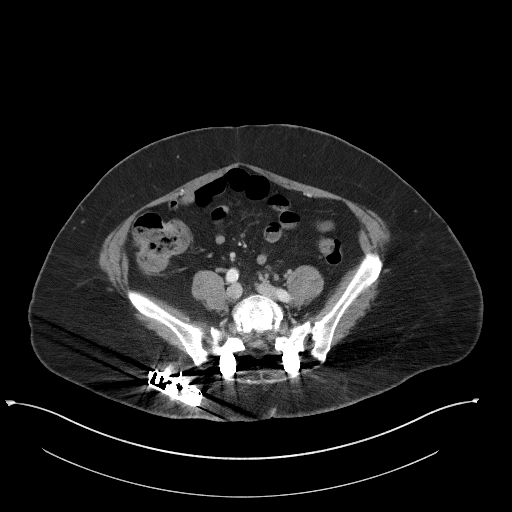
[im 49/89  soft-tissue]
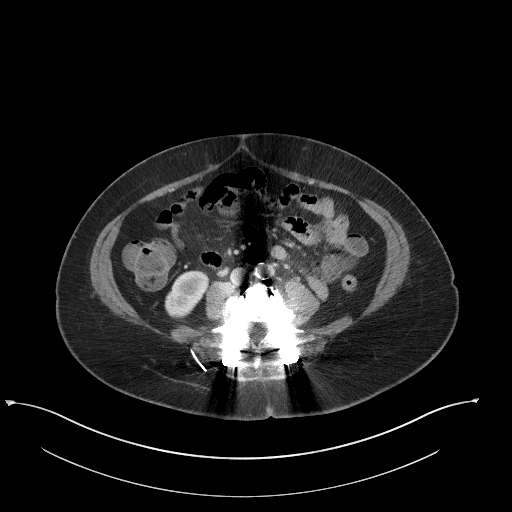
[im 53/89  soft-tissue]
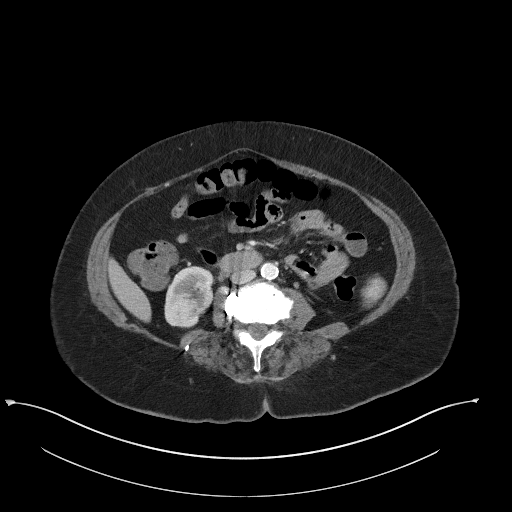
[im 62/89  soft-tissue]
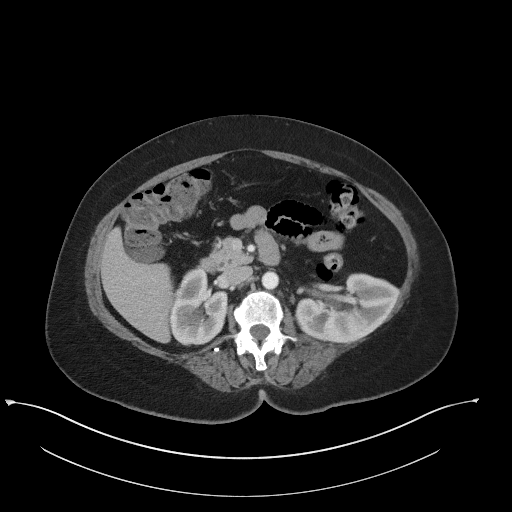
[im 62/89  bone]
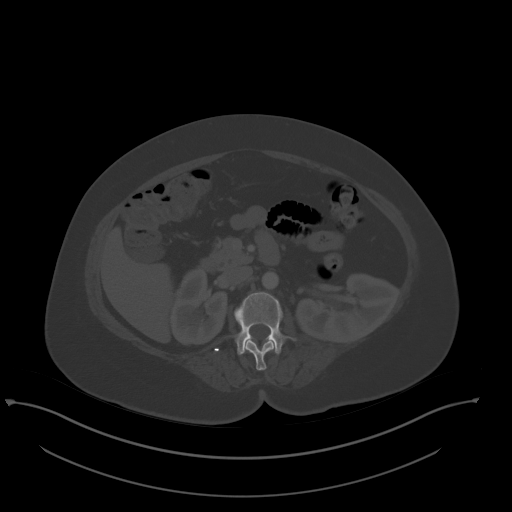
[im 71/89  soft-tissue]
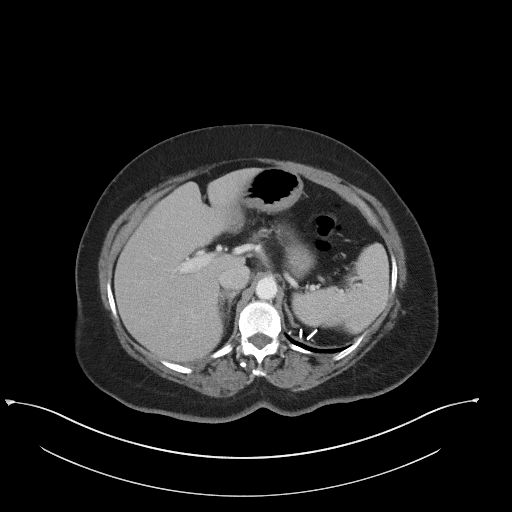
[im 75/89  soft-tissue]
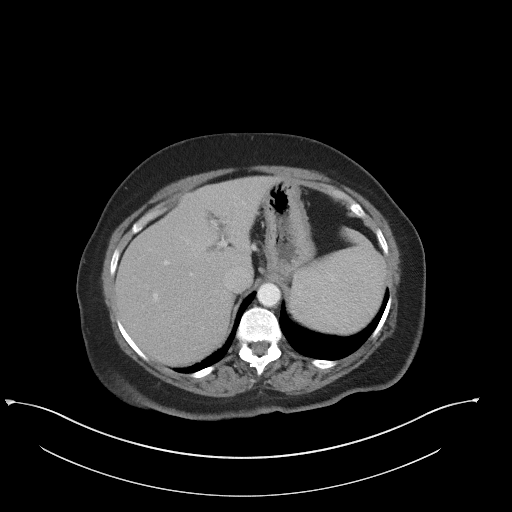
[im 84/89  soft-tissue]
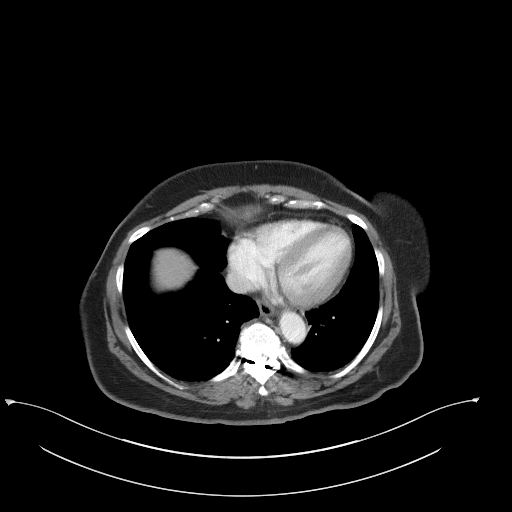

[Series 5: coronal st · coronal · 0.70mm/px · 3 of 86 slices shown]
[im 29/86  soft-tissue]
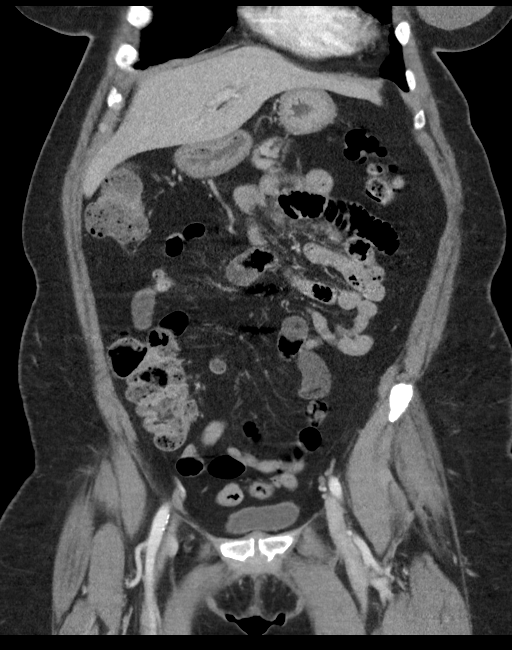
[im 38/86  soft-tissue]
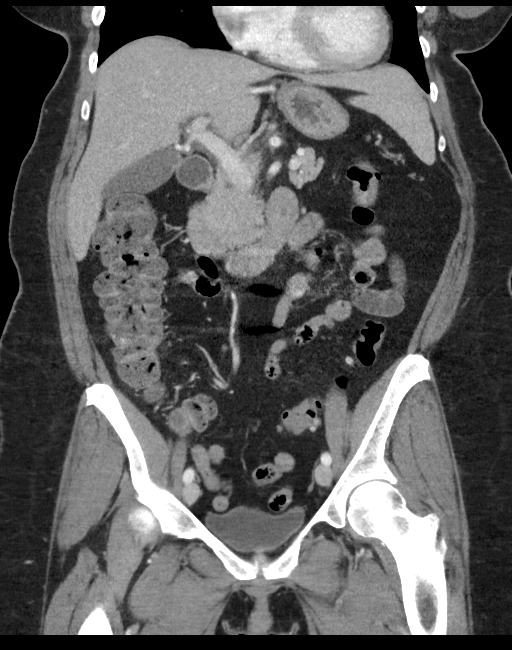
[im 48/86  soft-tissue]
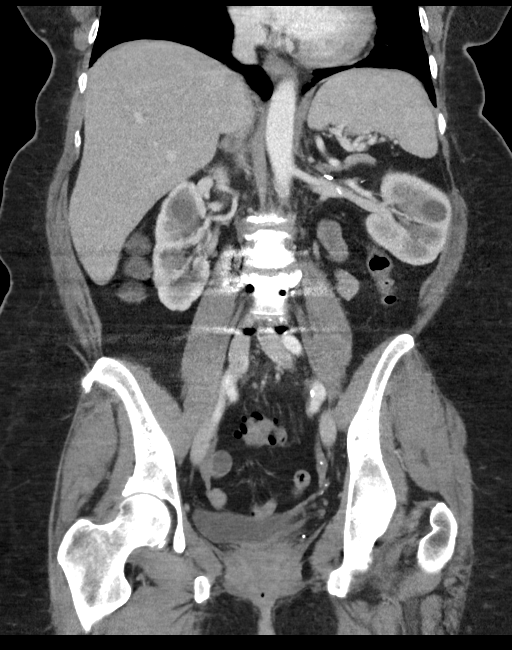

[15 of 46 positions shown; findings below may reference images not displayed]

FINDINGS: Lower chest: A left-sided breast implant is partially imaged. The
visualized lung bases are grossly clear. The visualized portions of
the mediastinum are unremarkable.

Hepatobiliary: The liver is unremarkable in appearance. The
gallbladder is unremarkable in appearance. The common bile duct
remains normal in caliber.

Pancreas: The pancreas is within normal limits.

Spleen: The spleen is unremarkable in appearance.

Adrenals/Urinary Tract: The adrenal glands are grossly unremarkable.

Scattered bilateral renal cysts are seen. There is no evidence of
hydronephrosis. No renal or ureteral stones are identified.

Stomach/Bowel: The stomach is unremarkable in appearance. The small
bowel is within normal limits. The patient is status post
appendectomy.

Focal soft tissue inflammation is noted at the distal sigmoid colon,
with mild wall thickening and likely inflamed diverticulum,
concerning for mild acute diverticulitis. There is no evidence of
perforation or abscess formation. Scattered diverticulosis is noted
along the descending and sigmoid colon.

Vascular/Lymphatic: Scattered calcification is seen along the
abdominal aorta and its branches. The abdominal aorta is otherwise
grossly unremarkable. The inferior vena cava is grossly
unremarkable. No retroperitoneal lymphadenopathy is seen. No pelvic
sidewall lymphadenopathy is identified.

Reproductive: The bladder is mildly distended and grossly
unremarkable. The patient is status post hysterectomy. The ovaries
are relatively symmetric. No suspicious adnexal masses are seen.

Other: A metallic device is noted at the right flank, with
associated thoracic spinal stimulation leads.

Musculoskeletal: No acute osseous abnormalities are identified. The
patient is status post lumbar spinal fusion at L3-S1, with
underlying decompression. The visualized musculature is unremarkable
in appearance.
IMPRESSION: 1. Mild acute diverticulitis at the distal sigmoid colon, with focal
soft tissue inflammation, mild wall thickening and likely an
inflamed diverticulum. No evidence of perforation or abscess
formation at this time. After completion of treatment, would
consider sigmoidoscopy to exclude underlying mass.
2. Scattered diverticulosis along the descending and sigmoid colon.
3. Scattered aortic atherosclerosis.
4. Bilateral renal cysts noted.

## 2018-05-23 ENCOUNTER — Other Ambulatory Visit: Payer: Self-pay | Admitting: Nurse Practitioner

## 2018-05-23 DIAGNOSIS — E1329 Other specified diabetes mellitus with other diabetic kidney complication: Secondary | ICD-10-CM

## 2018-05-23 DIAGNOSIS — R809 Proteinuria, unspecified: Principal | ICD-10-CM

## 2018-05-23 DIAGNOSIS — E7800 Pure hypercholesterolemia, unspecified: Secondary | ICD-10-CM

## 2018-05-23 DIAGNOSIS — Z794 Long term (current) use of insulin: Principal | ICD-10-CM

## 2018-05-24 DIAGNOSIS — R0602 Shortness of breath: Secondary | ICD-10-CM | POA: Diagnosis not present

## 2018-05-24 DIAGNOSIS — I208 Other forms of angina pectoris: Secondary | ICD-10-CM | POA: Diagnosis not present

## 2018-05-29 DIAGNOSIS — I208 Other forms of angina pectoris: Secondary | ICD-10-CM | POA: Diagnosis not present

## 2018-05-29 DIAGNOSIS — E785 Hyperlipidemia, unspecified: Secondary | ICD-10-CM | POA: Diagnosis not present

## 2018-05-29 DIAGNOSIS — E119 Type 2 diabetes mellitus without complications: Secondary | ICD-10-CM | POA: Diagnosis not present

## 2018-05-29 DIAGNOSIS — I10 Essential (primary) hypertension: Secondary | ICD-10-CM | POA: Diagnosis not present

## 2018-05-29 DIAGNOSIS — R079 Chest pain, unspecified: Secondary | ICD-10-CM | POA: Diagnosis not present

## 2018-05-29 DIAGNOSIS — R9431 Abnormal electrocardiogram [ECG] [EKG]: Secondary | ICD-10-CM | POA: Diagnosis not present

## 2018-05-29 DIAGNOSIS — R0602 Shortness of breath: Secondary | ICD-10-CM | POA: Diagnosis not present

## 2018-05-29 DIAGNOSIS — K219 Gastro-esophageal reflux disease without esophagitis: Secondary | ICD-10-CM | POA: Diagnosis not present

## 2018-05-29 DIAGNOSIS — Z87891 Personal history of nicotine dependence: Secondary | ICD-10-CM | POA: Diagnosis not present

## 2018-06-07 DIAGNOSIS — G894 Chronic pain syndrome: Secondary | ICD-10-CM | POA: Diagnosis not present

## 2018-06-07 DIAGNOSIS — M25569 Pain in unspecified knee: Secondary | ICD-10-CM | POA: Diagnosis not present

## 2018-06-07 DIAGNOSIS — Z79891 Long term (current) use of opiate analgesic: Secondary | ICD-10-CM | POA: Diagnosis not present

## 2018-06-07 DIAGNOSIS — M545 Low back pain: Secondary | ICD-10-CM | POA: Diagnosis not present

## 2018-07-04 ENCOUNTER — Other Ambulatory Visit: Payer: Self-pay | Admitting: Family Medicine

## 2018-07-05 NOTE — Telephone Encounter (Signed)
New meter requested (the whole kit was what was queued up for me) I just approved a new meter on 11/02/2017 Medicare will only pay for one meter every five years I'm going to decline this

## 2018-07-06 NOTE — Telephone Encounter (Signed)
Left detailed voicemial 

## 2018-07-09 ENCOUNTER — Encounter: Payer: Self-pay | Admitting: Family Medicine

## 2018-07-09 ENCOUNTER — Other Ambulatory Visit
Admission: RE | Admit: 2018-07-09 | Discharge: 2018-07-09 | Disposition: A | Discharge status: Home / Self Care | Payer: Medicare HMO | Source: Ambulatory Visit | Attending: Family Medicine | Admitting: Family Medicine

## 2018-07-09 ENCOUNTER — Ambulatory Visit
Admission: RE | Admit: 2018-07-09 | Discharge: 2018-07-09 | Disposition: A | Discharge status: Home / Self Care | Payer: Medicare HMO | Source: Ambulatory Visit | Attending: Family Medicine | Admitting: Family Medicine

## 2018-07-09 ENCOUNTER — Ambulatory Visit (INDEPENDENT_AMBULATORY_CARE_PROVIDER_SITE_OTHER): Payer: Medicare HMO | Admitting: Family Medicine

## 2018-07-09 VITALS — BP 120/84 | HR 84 | Temp 98.1°F | Resp 16 | Ht 66.0 in | Wt 175.3 lb

## 2018-07-09 DIAGNOSIS — J4541 Moderate persistent asthma with (acute) exacerbation: Secondary | ICD-10-CM

## 2018-07-09 DIAGNOSIS — E0829 Diabetes mellitus due to underlying condition with other diabetic kidney complication: Secondary | ICD-10-CM

## 2018-07-09 DIAGNOSIS — R05 Cough: Secondary | ICD-10-CM

## 2018-07-09 DIAGNOSIS — R809 Proteinuria, unspecified: Secondary | ICD-10-CM | POA: Diagnosis not present

## 2018-07-09 DIAGNOSIS — R058 Other specified cough: Secondary | ICD-10-CM

## 2018-07-09 DIAGNOSIS — Z794 Long term (current) use of insulin: Secondary | ICD-10-CM | POA: Diagnosis not present

## 2018-07-09 DIAGNOSIS — Z1239 Encounter for other screening for malignant neoplasm of breast: Secondary | ICD-10-CM | POA: Diagnosis not present

## 2018-07-09 LAB — CBC
HCT: 38.4 % (ref 36.0–46.0)
Hemoglobin: 12.7 g/dL (ref 12.0–15.0)
MCH: 26.8 pg (ref 26.0–34.0)
MCHC: 33.1 g/dL (ref 30.0–36.0)
MCV: 81 fL (ref 80.0–100.0)
Platelets: 325 10*3/uL (ref 150–400)
RBC: 4.74 MIL/uL (ref 3.87–5.11)
RDW: 13.7 % (ref 11.5–15.5)
WBC: 3.9 10*3/uL — ABNORMAL LOW (ref 4.0–10.5)
nRBC: 0 % (ref 0.0–0.2)

## 2018-07-09 LAB — BASIC METABOLIC PANEL
Anion gap: 6 (ref 5–15)
BUN: 16 mg/dL (ref 8–23)
CO2: 30 mmol/L (ref 22–32)
Calcium: 9.5 mg/dL (ref 8.9–10.3)
Chloride: 106 mmol/L (ref 98–111)
Creatinine, Ser: 0.58 mg/dL (ref 0.44–1.00)
GFR calc Af Amer: >60 mL/min (ref >60)
GFR calc non Af Amer: >60 mL/min (ref >60)
Glucose, Bld: 94 mg/dL (ref 70–99)
Potassium: 3.8 mmol/L (ref 3.5–5.1)
Sodium: 142 mmol/L (ref 135–145)

## 2018-07-09 MED ORDER — BUDESONIDE-FORMOTEROL FUMARATE 80-4.5 MCG/ACT IN AERO: 2.0000 | INHALATION_SPRAY | Freq: Two times a day (BID) | RESPIRATORY_TRACT | 3 refills | Status: DC

## 2018-07-09 MED ORDER — PROMETHAZINE-DM 6.25-15 MG/5ML PO SYRP: 5.0000 mL | ORAL_SOLUTION | Freq: Four times a day (QID) | ORAL | 0 refills | Status: DC | PRN

## 2018-07-09 MED ORDER — MELOXICAM 7.5 MG PO TABS: 7.5000 mg | ORAL_TABLET | Freq: Every day | ORAL | 0 refills | Status: DC

## 2018-07-09 MED ORDER — AZITHROMYCIN 250 MG PO TABS: ORAL_TABLET | ORAL | 0 refills | Status: DC

## 2018-07-09 NOTE — Progress Notes (Signed)
Name: Lori Potts   MRN: [id number]    DOB: [birth year]   Date:07/09/2018       Progress Note  Subjective  Chief Complaint  Chief Complaint  Patient presents with  . URI    cough, congested, bodyaches, fever at night, chills, sweating for 7 days  . Shoulder Pain    right    HPI  Pt presents with concern for URI symptoms ongoing for over 7 days now.  She has congested productive cough, body aches, nighttime fevers and chills, fatigue, shortness of breath with cough and exertion. No confusion, chest pain.  Notes history of asthma - she has been taking albuterol nebulizer daily and this does help her symptoms a little bit. Not on daily medication.  She has been having some new RIGHT shoulder pain in the last 24 hours - she is sleeping on her back without extra pillows, no recent injuries.  She endorses good AROM, no weakness, no numbness/tingling. She is having generalized body aches with her current illness.    DM: She is T2DM, discussed sick day care; she has been checking her BG's and they have still been controlled around 100's.  Discussed prednisone, and she does not want to take it, willing to try ICS instead - will trial Symbicort to avoid CBG spike and because patient adamantly declines oral steroid.  Patient Active Problem List   Diagnosis Date Noted  . Microalbuminuria 01/22/2018  . Anemia 09/22/2017  . S/P knee replacement 07/28/2017  . Allergic rhinitis 05/26/2017  . Vitamin D deficiency 05/19/2017  . Chronic nonintractable headache 05/18/2017  . Special screening for malignant neoplasms, colon   . Gastroesophageal reflux disease   . Gastritis without bleeding   . 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 Insomnia 11/29/2016  . 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 Vasomotor flushing 04/19/2016  . Abdominal wall pain in right flank 03/29/2016  . Diverticulosis 02/24/2016  . Hypercholesterolemia 06/16/2015  . FHx: migraine  headaches 04/13/2015  . Chronic pain 04/13/2015  . Neck pain 02/26/2015  . Status post lumbar surgery 02/26/2015  . Abnormal ECG 01/27/2015  . Angina pectoris ([place]) 01/27/2015  . 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 BP (high blood pressure) 01/27/2015    Social History   Tobacco Use  . Smoking status: Former Smoker    Types: Cigarettes    Last attempt to quit: 2006    Years since quitting: 13.8  . Smokeless tobacco: Never Used  . Tobacco comment: quit smoking 63yr ago  Substance Use Topics  . Alcohol use: No     Current Outpatient Medications:  .  albuterol (PROVENTIL HFA;VENTOLIN HFA) 108 (90 Base) MCG/ACT inhaler, Inhale 2 puffs into the lungs every 4 (four) hours as needed (for wheezing and shortness of breath from seasonal allergies). PROVENTIL, Disp: 3 Inhaler, Rfl: 0 .  albuterol (PROVENTIL) (2.5 MG/3ML) 0.083% nebulizer solution, Take 2.5 mg by nebulization every 6 (six) hours as needed for wheezing or shortness of breath. , Disp: , Rfl:  .  aspirin EC 81 MG tablet, Take 81 mg by mouth daily., Disp: , Rfl:  .  atorvastatin (LIPITOR) 80 MG tablet, Take 1 tablet (80 mg total) by mouth daily., Disp: 90 tablet, Rfl: 0 .  baclofen (LIORESAL) 10 MG tablet, Take 1 tablet (10 mg total) by mouth 3 (three) times daily as needed for muscle spasms., Disp: 50 tablet, Rfl: 1 .  Blood Glucose Calibration (  TRUE METRIX LEVEL 1) Low SOLN, Use to check fingerstick blood sugars once a day; LON 99 months; E11.9, Disp: 1 each, Rfl: 1 .  Blood Glucose Monitoring Suppl (TRUE METRIX METER) w/Device KIT, Use to check fingerstick blood sugars once a day; LON 99 months; E11.9, Disp: 1 kit, Rfl: 0 .  glucose blood (ACCU-CHEK AVIVA) test strip, Check fingerstick blood sugars once a day; LON 99 months, Dx E11.9, Disp: 100 each, Rfl: 3 .  ibuprofen (ADVIL,MOTRIN) 600 MG tablet, Take 1 tablet (600 mg total) by mouth every 8 (eight) hours as needed for  headache., Disp: 30 tablet, Rfl: 0 .  ipratropium-albuterol (DUONEB) 0.5-2.5 (3) MG/3ML SOLN, TAKE 3 MLS BY NEBULIZATION EVERY 4 (FOUR) HOURS AS NEEDED. (Patient taking differently: Take 3 mLs by nebulization every 4 (four) hours as needed (for shortness of breath or wheezing). ), Disp: 360 mL, Rfl: 3 .  Lancets (FREESTYLE) lancets, Use to check fingerstick blood sugars once a day; LON 99 months; E11.9, Disp: 100 each, Rfl: 3 .  loratadine (CLARITIN) 10 MG tablet, Take 1 tablet (10 mg total) by mouth daily., Disp: 90 tablet, Rfl: 2 .  losartan (COZAAR) 25 MG tablet, TAKE 1 TABLET BY MOUTH EVERY DAY, Disp: 30 tablet, Rfl: 0 .  mometasone (NASONEX) 50 MCG/ACT nasal spray, Place 2 sprays into the nose daily. (Patient taking differently: Place 2 sprays into the nose daily as needed (for allergies). ), Disp: 17 g, Rfl: 0 .  montelukast (SINGULAIR) 10 MG tablet, Take 1 tablet (10 mg total) by mouth at bedtime., Disp: 30 tablet, Rfl: 11 .  omeprazole (PRILOSEC) 20 MG capsule, Take 1 capsule (20 mg total) by mouth 2 (two) times daily before a meal., Disp: , Rfl:  .  oxyCODONE-acetaminophen (PERCOCET) 10-325 MG tablet, Take 10-325 tablets by mouth 5 (five) times daily., Disp: , Rfl: 0 .  Vitamin D, Cholecalciferol, 1000 units TABS, Take 2,000 Units by mouth daily., Disp: , Rfl:  .  azithromycin (ZITHROMAX) 250 MG tablet, Day 1: Take 2 tablets; Days 2-5: Take 1 tablet daily, Disp: 6 tablet, Rfl: 0 .  budesonide-formoterol (SYMBICORT) 80-4.5 MCG/ACT inhaler, Inhale 2 puffs into the lungs 2 (two) times daily., Disp: 1 Inhaler, Rfl: 3 .  fexofenadine (ALLEGRA ALLERGY) 180 MG tablet, Take 1 tablet (180 mg total) by mouth daily. StartTaking after completing Allegra-D medication (Patient not taking: Reported on 05/09/2018), Disp: 30 tablet, Rfl: 0 .  Insulin Pen Needle (BD ULTRA-FINE PEN NEEDLES) 29G X 12.7MM MISC, For use with injectable GLP-1 inhibitor, Victoza, once a day, Disp: 100 each, Rfl: 3 .  meloxicam (MOBIC)  7.5 MG tablet, Take 1 tablet (7.5 mg total) by mouth daily., Disp: 20 tablet, Rfl: 0 .  promethazine-dextromethorphan (PROMETHAZINE-DM) 6.25-15 MG/5ML syrup, Take 5 mLs by mouth 4 (four) times daily as needed for cough., Disp: 118 mL, Rfl: 0  Allergies  Allergen Reactions  . Metformin And Related Diarrhea  . Adhesive [Tape] Rash and Other (See Comments)    Regular tape is ok, allergy is to paper tape    I personally reviewed active problem list, medication list, allergies, lab results with the patient/caregiver today.  ROS  Ten systems reviewed and is negative except as mentioned in HPI  Objective  Vitals:   07/09/18 1111  BP: 120/84  Pulse: 84  Resp: 16  Temp: 98.1 F (36.7 C)  TempSrc: Oral  SpO2: 96%  Weight: 175 lb 4.8 oz (79.5 kg)  Height: '5\' 6"'$  (1.676 m)   Body mass  index is 28.29 kg/m.  Nursing Note and Vital Signs reviewed.  Physical Exam  Constitutional: Patient appears well-developed and well-nourished.  No distress.  HEENT: head atraumatic, normocephalic, pupils equal and reactive to light, Bilateral TM's without erythema or effusion,  bilateral maxillary and frontal sinuses are non-tender, neck supple without lymphadenopathy, throat within normal limits - no erythema or exudate, no tonsillar swelling Cardiovascular: Normal rate, regular rhythm and normal heart sounds.  No murmur heard. No BLE edema. Pulmonary/Chest: Effort slightly increased, no retractions, and breath sounds with inspiratory and expiratory wheezes and rhonchi. No respiratory distress - ambulatory O2 sat is 97%. Abdominal: Soft, bowel sounds normal, there is no tenderness, no HSM Psychiatric: Patient has a normal mood and affect. behavior is normal. Judgment and thought content normal.  No results found for this or any previous visit (from the past 72 hour(s)).  Assessment & Plan  1. Moderate persistent asthma with exacerbation - [facility] and CXR. - DG Chest 2 View;  Future - budesonide-formoterol (SYMBICORT) 80-4.5 MCG/ACT inhaler; Inhale 2 puffs into the lungs 2 (two) times daily.  Dispense: 1 Inhaler; Refill: 3 - azithromycin (ZITHROMAX) 250 MG tablet; Day 1: Take 2 tablets; Days 2-5: Take 1 tablet daily  Dispense: 6 tablet; Refill: 0 - promethazine-dextromethorphan (PROMETHAZINE-DM) 6.25-15 MG/5ML syrup; Take 5 mLs by mouth 4 (four) times daily as needed for cough.  Dispense: 118 mL; Refill: 0 - DG Chest 2 View - CBC; Future - Basic Metabolic Panel (BMET); Future  2. 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  See AVS regarding additional teaching.  She will monitor BG's closely and continue insulin per orders. - CBC; Future - Basic Metabolic Panel (BMET); Future  3. Productive cough - DG Chest 2 View; Future - budesonide-formoterol (SYMBICORT) 80-4.5 MCG/ACT inhaler; Inhale 2 puffs into the lungs 2 (two) times daily.  Dispense: 1 Inhaler; Refill: 3 - azithromycin (ZITHROMAX) 250 MG tablet; Day 1: Take 2 tablets; Days 2-5: Take 1 tablet daily  Dispense: 6 tablet; Refill: 0 - promethazine-dextromethorphan (PROMETHAZINE-DM) 6.25-15 MG/5ML syrup; Take 5 mLs by mouth 4 (four) times daily as needed for cough.  Dispense: 118 mL; Refill: 0 - DG Chest 2 View - CBC; Future - Basic Metabolic Panel (BMET); Future  4. Screening for breast cancer - MM 3D SCREEN BREAST BILATERAL  -Red flags and when to present for emergency care or RTC including fever >101.66F, chest pain, shortness of breath, new/worsening/un-resolving symptoms, reviewed with patient at time of visit. Follow up and care instructions discussed and provided in AVS.

## 2018-07-09 NOTE — Patient Instructions (Signed)
Diabetes Mellitus and Sick Day Management Blood sugar (glucose) can be difficult to control when you are sick. Common illnesses that can cause problems for people with diabetes (diabetes mellitus) include colds, fever, flu (influenza), nausea, vomiting, and diarrhea. These illnesses can cause stress and loss of body fluids (dehydration), and those issues can cause blood glucose levels to increase. Because of this, it is very important to take your insulin and diabetes medicines and eat some form of carbohydrate when you are sick. You should make a plan for days when you are sick (sick day plan) as part of your diabetes management plan. You and your health care provider should make this plan in advance. The following guidelines are intended to help you manage an illness that lasts for about 24 hours or less. Your health care provider may also give you more specific instructions. What do I need to do to manage my blood glucose?  Check your blood glucose every 2-4 hours, or as often as told by your health care provider.  Know your sick day treatment goals. Your target blood glucose levels may be different when you are sick.  If you use insulin, take your usual dose. ? If your blood glucose continues to be too high, you may need to take an additional insulin dose as told by your health care provider.  If you use oral diabetes medicine, you may need to stop taking it if you are not able to eat or drink normally. Ask your health care provider about whether you need to stop taking these medicines while you are sick.  If you use injectable hormone medicines other than insulin to control your diabetes, ask your health care provider about whether you need to stop taking these medicines while you are sick. What else can I do to manage my diabetes when I am sick? Check your ketones  If you have type 1 diabetes, check your urine ketones every 4 hours.  If you have type 2 diabetes, check your urine ketones as  often as told by your health care provider. Drink fluids  Drink enough fluid to keep your urine clear or pale yellow. This is especially important if you have a fever, vomiting, or diarrhea. Those symptoms can lead to dehydration.  Follow any instructions from your health care provider about beverages to avoid. ? Do not drink alcohol, caffeine, or drinks that contain a lot of sugar. Take medicines as directed  Take-over-the-counter and prescription medicines only as told by your health care provider.  Check medicine labels for added sugars. Some medicines may contain sugar or types of sugars that can raise your blood glucose level. What foods can I eat when I am sick? You need to eat some form of carbohydrates when you are sick. You should eat 45-50 grams (45-50 g) of carbohydrates every 3-4 hours until you feel better. All of the food choices below contain about 15 g of carbohydrates. Plan ahead and keep some of these foods around so you have them if you get sick.  4-6 oz (120-177 mL) carbonated beverage that contains sugar, such as regular (not diet) soda. You may be able to drink carbonated beverages more easily if you open the beverage and let it sit at room temperature for a few minutes before drinking.   of a twin frozen ice pop.  4 oz (120 g) regular gelatin.  4 oz (120 mL) fruit juice.  4 oz (120 g) ice cream or frozen yogurt.  2 oz (60   g) sherbet.  8 oz (240 mL) clear broth or soup.  4 oz (120 g) regular custard.  4 oz (120 g) regular pudding.  8 oz (240 g) plain yogurt.  1 slice bread or toast.  6 saltine crackers.  5 vanilla wafers.  Questions to ask your health care provider Consider asking the following questions so you know what to do on days when you are sick:  Should I adjust my diabetes medicines?  How often do I need to check my blood glucose?  What supplies do I need to manage my diabetes at home when I am sick?  What number can I call if I have  questions?  What foods and drinks should I avoid?  Contact a health care provider if:  You develop symptoms of diabetic ketoacidosis, such as: ? Fatigue. ? Weight loss. ? Excessive thirst. ? Light-headedness. ? Fruity or sweet-smelling breath. ? Excessive urination. ? Vision changes. ? Confusion or irritability. ? Nausea. ? Vomiting. ? Rapid breathing. ? Pain in the abdomen. ? Feeling flushed.  You are unable to drink fluids without vomiting.  You have any of the following for more than 6 hours: ? Nausea. ? Vomiting. ? Diarrhea.  Your blood glucose is at or above 240 mg/dL (13.3 mmol/L), even after you take an additional insulin dose.  You have a change in how you think, feel, or act (mental status).  You develop another serious illness.  You have been sick or have had a fever for 2 days or longer and you are not getting better. Get help right away if:  Your blood glucose is lower than 54 mg/dL (3.0 mmol/L).  You have difficulty breathing.  You have moderate or high ketone levels in your urine.  You used emergency glucagon to treat low blood glucose. Summary  Blood sugar (glucose) can be difficult to control when you are sick. Common illnesses that can cause problems for people with diabetes (diabetes mellitus) include colds, fever, flu (influenza), nausea, vomiting, and diarrhea.  Illnesses can cause stress and loss of body fluids (dehydration), and those issues can cause blood glucose levels to increase.  Make a plan for days when you are sick (sick day plan) as part of your diabetes management plan. You and your health care provider should make this plan in advance.  It is very important to take your insulin and diabetes medicines and to eat some form of carbohydrate when you are sick.  Contact your health care provider if have problems managing your blood glucose levels when you are sick, or if you have been sick or had a fever for 2 days or longer and are  not getting better. This information is not intended to replace advice given to you by your health care provider. Make sure you discuss any questions you have with your health care provider. Document Released: 08/11/2003 Document Revised: 05/06/2016 Document Reviewed: 05/06/2016 Elsevier Interactive Patient Education  2018 [employer]  

## 2018-07-16 ENCOUNTER — Encounter: Payer: Self-pay | Admitting: Pharmacy Technician

## 2018-07-16 ENCOUNTER — Other Ambulatory Visit: Payer: Self-pay | Admitting: Pharmacy Technician

## 2018-07-16 NOTE — Patient Outreach (Signed)
[facility] Care Management  07/16/2018  Lori Potts [birth year] [id number]   Successful call to patient in reference to reapplying for [employer] patient assistance, HIPAA identifiers verified. Lori Potts confirms she wants to re-apply for her Proventil HFA. Prepared patient portion to be mailed and will send provider portion via inter- office.  Will follow up with patient in 7-10 business days (Due to holiday) to confirm application has been received.  Suzan SlickAshley N. Effie Shyoleman, CPhT Certified Pharmacy Technician [facility] Care Management Direct Dial:[phone]

## 2018-07-22 ENCOUNTER — Other Ambulatory Visit: Payer: Self-pay | Admitting: Nurse Practitioner

## 2018-07-22 DIAGNOSIS — R809 Proteinuria, unspecified: Principal | ICD-10-CM

## 2018-07-22 DIAGNOSIS — E1329 Other specified diabetes mellitus with other diabetic kidney complication: Secondary | ICD-10-CM

## 2018-07-22 DIAGNOSIS — Z794 Long term (current) use of insulin: Principal | ICD-10-CM

## 2018-07-23 NOTE — Telephone Encounter (Signed)
Last Cr and K+ reviewed; Rx approved 

## 2018-07-25 ENCOUNTER — Other Ambulatory Visit: Payer: Self-pay | Admitting: Family Medicine

## 2018-07-26 ENCOUNTER — Ambulatory Visit: Payer: Self-pay | Admitting: Pharmacy Technician

## 2018-07-26 ENCOUNTER — Other Ambulatory Visit: Payer: Self-pay | Admitting: Pharmacy Technician

## 2018-07-26 NOTE — Patient Outreach (Signed)
[facility] Care Management  07/26/2018  Lori Potts 1945-11-21 [id number]    Successful call placed to patient regarding patient assistance application, HIPAA identifiers verified. Mrs. Lori Potts states she has not received [employer] application that was mailed out to her. Informed her that I would follow up with her in 2-3 business days to see if it has been received.  Suzan SlickAshley N. Effie Shyoleman, CPhT Certified Pharmacy Technician [facility] Care Management Direct Dial:[phone]

## 2018-07-27 ENCOUNTER — Ambulatory Visit: Payer: Self-pay | Admitting: Pharmacy Technician

## 2018-07-30 ENCOUNTER — Encounter: Payer: Self-pay | Admitting: Nurse Practitioner

## 2018-07-30 ENCOUNTER — Ambulatory Visit: Payer: Medicare HMO | Admitting: Nurse Practitioner

## 2018-07-30 ENCOUNTER — Ambulatory Visit (INDEPENDENT_AMBULATORY_CARE_PROVIDER_SITE_OTHER): Payer: Medicare HMO | Admitting: Nurse Practitioner

## 2018-07-30 ENCOUNTER — Ambulatory Visit
Admission: RE | Admit: 2018-07-30 | Discharge: 2018-07-30 | Disposition: A | Discharge status: Home / Self Care | Payer: Medicare HMO | Attending: Nurse Practitioner | Admitting: Nurse Practitioner

## 2018-07-30 ENCOUNTER — Ambulatory Visit
Admission: RE | Admit: 2018-07-30 | Discharge: 2018-07-30 | Disposition: A | Discharge status: Home / Self Care | Payer: Medicare HMO | Source: Ambulatory Visit | Attending: Nurse Practitioner | Admitting: Nurse Practitioner

## 2018-07-30 VITALS — BP 136/70 | HR 90 | Temp 97.8°F | Resp 16 | Ht 66.0 in | Wt 178.9 lb

## 2018-07-30 DIAGNOSIS — R05 Cough: Secondary | ICD-10-CM | POA: Insufficient documentation

## 2018-07-30 DIAGNOSIS — R059 Cough, unspecified: Secondary | ICD-10-CM

## 2018-07-30 DIAGNOSIS — R0602 Shortness of breath: Secondary | ICD-10-CM

## 2018-07-30 DIAGNOSIS — R058 Other specified cough: Secondary | ICD-10-CM

## 2018-07-30 DIAGNOSIS — J4541 Moderate persistent asthma with (acute) exacerbation: Secondary | ICD-10-CM

## 2018-07-30 DIAGNOSIS — R062 Wheezing: Secondary | ICD-10-CM

## 2018-07-30 MED ORDER — ALBUTEROL SULFATE (2.5 MG/3ML) 0.083% IN NEBU
2.5000 mg | INHALATION_SOLUTION | Freq: Once | RESPIRATORY_TRACT | Status: AC
  Administered 2018-07-30: 2.5 mg via RESPIRATORY_TRACT

## 2018-07-30 MED ORDER — ALBUTEROL SULFATE (2.5 MG/3ML) 0.083% IN NEBU: 2.5000 mg | INHALATION_SOLUTION | Freq: Four times a day (QID) | RESPIRATORY_TRACT | 1 refills | Status: DC | PRN

## 2018-07-30 MED ORDER — PROMETHAZINE-DM 6.25-15 MG/5ML PO SYRP: 5.0000 mL | ORAL_SOLUTION | Freq: Four times a day (QID) | ORAL | 0 refills | Status: DC | PRN

## 2018-07-30 NOTE — Patient Instructions (Addendum)
-   Please go across the street to get a chest x-ray to rule out pneumonia.  - Please take cough syrup as needed for cough  - Please take albuterol nebulizer as needed for shortness of breath/ wheezing up to every 6 hours - Rest and drink plenty of water; can try honey lemon tea to soothe throat.  - If you develop worsening shortness of breath, fatigue, chest pain or fevers and chills please go to the ER for further evaluation.

## 2018-07-30 NOTE — Progress Notes (Signed)
Name: HANAN MCWILLIAMS   MRN: [id number]    DOB: [birth year]   Date:07/30/2018       Progress Note  Subjective  Chief Complaint  Chief Complaint  Patient presents with  . Cough  . Headache  . Wheezing    HPI  One week of cough, chest congestion states the last three days have progressively gotten worse. Did not get out of bed yesterday because she was feeling short of breath. Patient endorses frontal headache, nasal congestion, starting to get a sore throat now.   Patient Active Problem List   Diagnosis Date Noted  . Microalbuminuria 01/22/2018  . Anemia 09/22/2017  . S/P knee replacement 07/28/2017  . Allergic rhinitis 05/26/2017  . Vitamin D deficiency 05/19/2017  . Chronic nonintractable headache 05/18/2017  . Special screening for malignant neoplasms, colon   . Gastroesophageal reflux disease   . Gastritis without bleeding   . 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 Insomnia 11/29/2016  . 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 Vasomotor flushing 04/19/2016  . Abdominal wall pain in right flank 03/29/2016  . Diverticulosis 02/24/2016  . Hypercholesterolemia 06/16/2015  . FHx: migraine headaches 04/13/2015  . Chronic pain 04/13/2015  . Neck pain 02/26/2015  . Status post lumbar surgery 02/26/2015  . Abnormal ECG 01/27/2015  . Angina pectoris ([place]) 01/27/2015  . 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 BP (high blood pressure) 01/27/2015    Past Medical History:  Diagnosis Date  . Arthritis   . 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 Cataract    right eye but immature  . Essential hypertension, benign    takes Lisinopril-HCTZ daily  . GERD (gastroesophageal reflux disease)   . Headache    sinus  . History of colon polyps    benign  . History of migraine   . History of shingles   . Hyperlipidemia    takes Lipitor daily  . Low back pain   . Nocturia   . S/P insertion of  spinal cord stimulator   . Seasonal allergies    takes Singulair daily as needed  . Type II or unspecified type diabetes mellitus without mention of complication, not stated as uncontrolled   . Vitamin D deficiency    takes Vit D weekly  . Weakness    numbness and tingling left arm  . Wears dentures    full upper and lower    Past Surgical History:  Procedure Laterality Date  . ABDOMINAL HYSTERECTOMY    . ANTERIOR CERVICAL DECOMP/DISCECTOMY FUSION N/A 02/26/2015   Procedure: ANTERIOR CERVICAL DISCECTOMY FUSION C4-5 (1 LEVEL);  Surgeon: Melina Schools, MD;  Location: [facility];  Service: Orthopedics;  Laterality: N/A;  . ANTERIOR LAT LUMBAR FUSION N/A 03/21/2013   Procedure: ANTERIOR LATERAL LUMBAR FUSION 1 LEVEL/ XLIF L3-L4 ;  Surgeon: Melina Schools, MD;  Location: [facility];  Service: Orthopedics;  Laterality: N/A;  . APPENDECTOMY    . AUGMENTATION MAMMAPLASTY Bilateral 1978  . BACK SURGERY    . BACK SURGERY     Lumbar fusion x 2  . BREAST EXCISIONAL BIOPSY Right 1970  . BREAST SURGERY  1990   Augementation  . CARDIAC CATHETERIZATION  5/12   ef 55%  . CARDIAC CATHETERIZATION  10/2010   [facility]; EF 55%  . CARDIAC CATHETERIZATION Left 02/16/2016   Procedure: Left Heart Cath and Coronary Angiography;  Surgeon: Yolonda Kida, MD;  Location: [facility];  Service: Cardiovascular;  Laterality: Left;  . COLONOSCOPY    . COLONOSCOPY WITH PROPOFOL N/A 04/10/2017   Procedure: COLONOSCOPY WITH PROPOFOL;  Surgeon: Lucilla Lame, MD;  Location: [facility];  Service: Gastroenterology;  Laterality: N/A;  Diabetic - insulin  . ESOPHAGOGASTRODUODENOSCOPY    . [place]   growth removed from left kidney   . pain stimulator    . POSTERIOR CERVICAL FUSION/FORAMINOTOMY Right 03/21/2013   Procedure: POSTERIOR L2-3 RIGHT FORAMINOTOMY;  Surgeon: Melina Schools, MD;  Location: [facility];  Service: Orthopedics;  Laterality: Right;  . REVISION OF SCAR TISSUE RECTUS MUSCLE    . SMALL BOWEL  REPAIR    . SPINAL CORD STIMULATOR BATTERY EXCHANGE N/A 10/17/2012   Procedure: SPINAL CORD STIMULATOR BATTERY REMOVAL;  Surgeon: Melina Schools, MD;  Location: [facility];  Service: Orthopedics;  Laterality: N/A;  . SPINAL CORD STIMULATOR BATTERY EXCHANGE N/A 07/22/2015   Procedure: REIMPLANTATION OF SPINAL CORD STIMULATOR BATTERY ;  Surgeon: Melina Schools, MD;  Location: [place] [facility];  Service: Orthopedics;  Laterality: N/A;  . TOTAL KNEE ARTHROPLASTY Left   . TOTAL KNEE ARTHROPLASTY Right 07/28/2017   Procedure: RIGHT TOTAL KNEE ARTHROPLASTY;  Surgeon: Sydnee Cabal, MD;  Location: [facility];  Service: Orthopedics;  Laterality: Right;    Social History   Tobacco Use  . Smoking status: Former Smoker    Types: Cigarettes    Last attempt to quit: 2006    Years since quitting: 13.9  . Smokeless tobacco: Never Used  . Tobacco comment: quit smoking 65yr ago  Substance Use Topics  . Alcohol use: No     Current Outpatient Medications:  .  albuterol (PROVENTIL HFA;VENTOLIN HFA) 108 (90 Base) MCG/ACT inhaler, Inhale 2 puffs into the lungs every 4 (four) hours as needed (for wheezing and shortness of breath from seasonal allergies). PROVENTIL, Disp: 3 Inhaler, Rfl: 0 .  albuterol (PROVENTIL) (2.5 MG/3ML) 0.083% nebulizer solution, Take 2.5 mg by nebulization every 6 (six) hours as needed for wheezing or shortness of breath. , Disp: , Rfl:  .  aspirin EC 81 MG tablet, Take 81 mg by mouth daily., Disp: , Rfl:  .  atorvastatin (LIPITOR) 80 MG tablet, Take 1 tablet (80 mg total) by mouth daily., Disp: 90 tablet, Rfl: 0 .  Blood Glucose Calibration (TRUE METRIX LEVEL 1) Low SOLN, Use to check fingerstick blood sugars once a day; LON 99 months; E11.9, Disp: 1 each, Rfl: 1 .  Blood Glucose Monitoring Suppl (TRUE METRIX METER) w/Device KIT, Use to check fingerstick blood sugars once a day; LON 99 months; E11.9, Disp: 1 kit, Rfl: 0 .  budesonide-formoterol (SYMBICORT) 80-4.5 MCG/ACT inhaler, Inhale 2 puffs into the  lungs 2 (two) times daily., Disp: 1 Inhaler, Rfl: 3 .  fexofenadine (ALLEGRA ALLERGY) 180 MG tablet, Take 1 tablet (180 mg total) by mouth daily. StartTaking after completing Allegra-D medication, Disp: 30 tablet, Rfl: 0 .  glucose blood (ACCU-CHEK AVIVA) test strip, Check fingerstick blood sugars once a day; LON 99 months, Dx E11.9, Disp: 100 each, Rfl: 3 .  ibuprofen (ADVIL,MOTRIN) 600 MG tablet, Take 1 tablet (600 mg total) by mouth every 8 (eight) hours as needed for headache., Disp: 30 tablet, Rfl: 0 .  Insulin Pen Needle (BD ULTRA-FINE PEN NEEDLES) 29G X 12.7MM MISC, For use with injectable GLP-1 inhibitor, Victoza, once a day, Disp: 100 each, Rfl: 3 .  ipratropium-albuterol (DUONEB) 0.5-2.5 (3) MG/3ML SOLN, TAKE 3 MLS BY NEBULIZATION EVERY 4 (FOUR) HOURS AS NEEDED. (Patient taking  differently: Take 3 mLs by nebulization every 4 (four) hours as needed (for shortness of breath or wheezing). ), Disp: 360 mL, Rfl: 3 .  Lancets (FREESTYLE) lancets, Use to check fingerstick blood sugars once a day; LON 99 months; E11.9, Disp: 100 each, Rfl: 3 .  loratadine (CLARITIN) 10 MG tablet, Take 1 tablet (10 mg total) by mouth daily., Disp: 90 tablet, Rfl: 2 .  losartan (COZAAR) 25 MG tablet, TAKE 1 TABLET BY MOUTH EVERY DAY, Disp: 30 tablet, Rfl: 5 .  meloxicam (MOBIC) 7.5 MG tablet, Take 1 tablet (7.5 mg total) by mouth daily., Disp: 20 tablet, Rfl: 0 .  mometasone (NASONEX) 50 MCG/ACT nasal spray, Place 2 sprays into the nose daily. (Patient taking differently: Place 2 sprays into the nose daily as needed (for allergies). ), Disp: 17 g, Rfl: 0 .  montelukast (SINGULAIR) 10 MG tablet, Take 1 tablet (10 mg total) by mouth at bedtime., Disp: 30 tablet, Rfl: 11 .  omeprazole (PRILOSEC) 20 MG capsule, Take 1 capsule (20 mg total) by mouth 2 (two) times daily before a meal., Disp: , Rfl:  .  oxyCODONE-acetaminophen (PERCOCET) 10-325 MG tablet, Take 10-325 tablets by mouth 5 (five) times daily., Disp: , Rfl: 0 .   promethazine-dextromethorphan (PROMETHAZINE-DM) 6.25-15 MG/5ML syrup, Take 5 mLs by mouth 4 (four) times daily as needed for cough., Disp: 118 mL, Rfl: 0 .  Vitamin D, Cholecalciferol, 1000 units TABS, Take 2,000 Units by mouth daily., Disp: , Rfl:   Allergies  Allergen Reactions  . Metformin And Related Diarrhea  . Adhesive [Tape] Rash and Other (See Comments)    Regular tape is ok, allergy is to paper tape    ROS   No other specific complaints in a complete review of systems (except as listed in HPI above).  Objective  Vitals:   07/30/18 1140  BP: 136/70  Pulse: 90  Resp: 16  Temp: 97.8 F (36.6 C)  TempSrc: Oral  SpO2: 97%  Weight: 178 lb 14.4 oz (81.1 kg)  Height: _0  (1.676 m)    Body mass index is 28.88 kg/m.  Nursing Note and Vital Signs reviewed.  Physical Exam  HENT:  Head: Normocephalic and atraumatic.  Right Ear: External ear normal.  Left Ear: External ear normal.  Nose: Mucosal edema present. Right sinus exhibits maxillary sinus tenderness and frontal sinus tenderness. Left sinus exhibits no maxillary sinus tenderness and no frontal sinus tenderness.  Mouth/Throat: Uvula is midline and oropharynx is clear and moist. No oropharyngeal exudate.  Eyes: Conjunctivae are normal. Right eye exhibits no discharge. Left eye exhibits no discharge.  Neck: Normal range of motion.  Cardiovascular: Normal rate and regular rhythm.  Pulmonary/Chest: Effort normal. No tachypnea. She has wheezes in the right upper field, the right lower field, the left upper field and the left lower field.  Moderate improvement after breathing treatment- still mild expiratory wheezing throughout   Abdominal: Soft. There is no tenderness.  Lymphadenopathy:    She has no cervical adenopathy.  Neurological: She is alert. GCS eye subscore is 4. GCS verbal subscore is 5. GCS motor subscore is 6.  Skin: Skin is warm and dry. No rash noted.  Psychiatric: Judgment normal.  Nursing note and  vitals reviewed.    No results found for this or any previous visit (from the past 48 hour(s)).  Assessment & Plan  1. Shortness of breath - DG Chest 2 View; Future - albuterol (PROVENTIL) (2.5 MG/3ML) 0.083% nebulizer solution 2.5 mg  2.  Cough - DG Chest 2 View; Future - promethazine-dextromethorphan (PROMETHAZINE-DM) 6.25-15 MG/5ML syrup; Take 5 mLs by mouth 4 (four) times daily as needed for cough.  Dispense: 118 mL; Refill: 0  3. Wheezing - DG Chest 2 View; Future - albuterol (PROVENTIL) (2.5 MG/3ML) 0.083% nebulizer solution 2.5 mg  4. Moderate persistent asthma with exacerbation - promethazine-dextromethorphan (PROMETHAZINE-DM) 6.25-15 MG/5ML syrup; Take 5 mLs by mouth 4 (four) times daily as needed for cough.  Dispense: 118 mL; Refill: 0 - albuterol (PROVENTIL) (2.5 MG/3ML) 0.083% nebulizer solution 2.5 mg - DME Nebulizer machine - Ambulatory referral to Chronic Care Management Services  5. Productive cough - promethazine-dextromethorphan (PROMETHAZINE-DM) 6.25-15 MG/5ML syrup; Take 5 mLs by mouth 4 (four) times daily as needed for cough.  Dispense: 118 mL; Refill: 0    -Red flags and when to present for emergency care or RTC including fever >101.19F, chest pain, shortness of breath, new/worsening/un-resolving symptoms, reviewed with patient at time of visit. Follow up and care instructions discussed and provided in AVS.

## 2018-07-31 ENCOUNTER — Telehealth: Payer: Self-pay

## 2018-07-31 ENCOUNTER — Ambulatory Visit: Payer: Self-pay | Admitting: Pharmacy Technician

## 2018-07-31 ENCOUNTER — Other Ambulatory Visit: Payer: Self-pay | Admitting: Nurse Practitioner

## 2018-07-31 MED ORDER — AZITHROMYCIN 250 MG PO TABS: ORAL_TABLET | ORAL | 0 refills | Status: DC

## 2018-07-31 NOTE — Telephone Encounter (Signed)
Copied from CRM [id number]. Topic: Quick Communication - Other Results (Clinic Use ONLY) >> Jul 30, 2018  4:08 PM [name], Lori P wrote: Pt requesting return call. She is checking on her xray results.

## 2018-08-03 ENCOUNTER — Ambulatory Visit: Payer: Self-pay | Admitting: Pharmacist

## 2018-08-03 DIAGNOSIS — R0602 Shortness of breath: Secondary | ICD-10-CM

## 2018-08-03 DIAGNOSIS — J4541 Moderate persistent asthma with (acute) exacerbation: Secondary | ICD-10-CM

## 2018-08-03 NOTE — Chronic Care Management (AMB) (Signed)
  Chronic Care Management   Note  08/03/2018 Name: Lori Potts MRN: [id number] DOB: 08/20/1946   72 y.o. year old female referred to Chronic Care Management by Sharyon CableElizabeth Poulose, NP  for medication assistance (Proventil). Chronic conditions include HTN, DM, HLD, asthma. Last office visit with Sharyon Cablelizabeth Poulose was 07/30/18.   Was unable to reach patient via telephone today and have left HIPAA compliant voicemail asking patient to return my call. (unsuccessful outreach #[phone]).  Noted in chart review that [facility] working with [name] to reapply for medication assistance for Proventil. [name] should return her application to Lilla ShookAshley Coleman, [facility] CPhT for the 2020 MAP cycle.   Plan: Will follow-up within 3-5  business days via telephone to ensure patient has no further questions about the reapplication process. As [facility] has already started the process, I will remain out of this patient's case.   Karalee HeightJulie Khadir Roam, PharmD Clinical Pharmacist [facility] [phone]

## 2018-08-04 ENCOUNTER — Other Ambulatory Visit: Payer: Self-pay

## 2018-08-04 ENCOUNTER — Ambulatory Visit
Admission: EM | Admit: 2018-08-04 | Discharge: 2018-08-04 | Disposition: A | Discharge status: Home / Self Care | Payer: Medicare HMO | Attending: Emergency Medicine | Admitting: Emergency Medicine

## 2018-08-04 DIAGNOSIS — J4541 Moderate persistent asthma with (acute) exacerbation: Secondary | ICD-10-CM | POA: Diagnosis not present

## 2018-08-04 MED ORDER — BENZONATATE 200 MG PO CAPS: ORAL_CAPSULE | ORAL | 0 refills | Status: DC

## 2018-08-04 MED ORDER — HYDROCOD POLST-CPM POLST ER 10-8 MG/5ML PO SUER: 5.0000 mL | Freq: Two times a day (BID) | ORAL | 0 refills | Status: DC

## 2018-08-04 MED ORDER — PREDNISONE 20 MG PO TABS: ORAL_TABLET | ORAL | 0 refills | Status: DC

## 2018-08-04 MED ORDER — IPRATROPIUM-ALBUTEROL 0.5-2.5 (3) MG/3ML IN SOLN: 3.0000 mL | RESPIRATORY_TRACT | 0 refills | Status: DC | PRN

## 2018-08-04 MED ORDER — IPRATROPIUM-ALBUTEROL 0.5-2.5 (3) MG/3ML IN SOLN
3.0000 mL | Freq: Once | RESPIRATORY_TRACT | Status: AC
  Administered 2018-08-04: 3 mL via RESPIRATORY_TRACT

## 2018-08-04 NOTE — ED Provider Notes (Signed)
[facility]    CSN: [id number] Arrival date & time: 08/04/18  1229     History   Chief Complaint Chief Complaint  Patient presents with  . Cough  . Wheezing    HPI Lori Potts is a 72 y.o. female.   HPI  72 year old female well-known to our practice presents with 10 days of cough shortness of breath and wheezing.  Had no fever or chills.  She saw her PCP on 12 9 and given promethazine for cough and scheduled her for a chest x-ray.  This was taken and I reviewed this showing hyper inflation with mild bronchitic changes.  There is no acute airspace disease or effusion seen.  It has mild persistent asthma and also diabetes mellitus.  Offered a option of prednisone at that visit but did not want to take prednisone due to fluctuations in her diabetes.  Days of the cough medicine they provided did not give her any relief of her coughing.  Has been using albuterol inhaler as well as a Symbicort inhaler.  Despite this she has not been able to stop coughing.        Past Medical History:  Diagnosis Date  . Arthritis   . 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 Cataract    right eye but immature  . Essential hypertension, benign    takes Lisinopril-HCTZ daily  . GERD (gastroesophageal reflux disease)   . Headache    sinus  . History of colon polyps    benign  . History of migraine   . History of shingles   . Hyperlipidemia    takes Lipitor daily  . Low back pain   . Nocturia   . S/P insertion of spinal cord stimulator   . Seasonal allergies    takes Singulair daily as needed  . Type II or unspecified type diabetes mellitus without mention of complication, not stated as uncontrolled   . Vitamin D deficiency    takes Vit D weekly  . Weakness    numbness and tingling left arm  . Wears dentures    full upper and lower    Patient Active Problem List   Diagnosis Date Noted  . Microalbuminuria 01/22/2018  . Anemia 09/22/2017  . S/P knee replacement  07/28/2017  . Allergic rhinitis 05/26/2017  . Vitamin D deficiency 05/19/2017  . Chronic nonintractable headache 05/18/2017  . Special screening for malignant neoplasms, colon   . Gastroesophageal reflux disease   . Gastritis without bleeding   . 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 Insomnia 11/29/2016  . 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 Vasomotor flushing 04/19/2016  . Abdominal wall pain in right flank 03/29/2016  . Diverticulosis 02/24/2016  . Hypercholesterolemia 06/16/2015  . FHx: migraine headaches 04/13/2015  . Chronic pain 04/13/2015  . Neck pain 02/26/2015  . Status post lumbar surgery 02/26/2015  . Abnormal ECG 01/27/2015  . Angina pectoris ([place]) 01/27/2015  . 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 BP (high blood pressure) 01/27/2015    Past Surgical History:  Procedure Laterality Date  . ABDOMINAL HYSTERECTOMY    . ANTERIOR CERVICAL DECOMP/DISCECTOMY FUSION N/A 02/26/2015   Procedure: ANTERIOR CERVICAL DISCECTOMY FUSION C4-5 (1 LEVEL);  Surgeon: Melina Schools, MD;  Location: [place];  Service: Orthopedics;  Laterality: N/A;  . ANTERIOR LAT LUMBAR FUSION N/A 03/21/2013   Procedure: ANTERIOR LATERAL LUMBAR FUSION 1 LEVEL/ XLIF L3-L4 ;  Surgeon: Melina Schools,  MD;  Location: [place];  Service: Orthopedics;  Laterality: N/A;  . APPENDECTOMY    . AUGMENTATION MAMMAPLASTY Bilateral 1978  . BACK SURGERY    . BACK SURGERY     Lumbar fusion x 2  . BREAST EXCISIONAL BIOPSY Right 1970  . BREAST SURGERY  1990   Augementation  . CARDIAC CATHETERIZATION  5/12   ef 55%  . CARDIAC CATHETERIZATION  10/2010   [facility]; EF 55%  . CARDIAC CATHETERIZATION Left 02/16/2016   Procedure: Left Heart Cath and Coronary Angiography;  Surgeon: Yolonda Kida, MD;  Location: [place] [facility];  Service: Cardiovascular;  Laterality: Left;  . COLONOSCOPY    . COLONOSCOPY WITH PROPOFOL N/A 04/10/2017    Procedure: COLONOSCOPY WITH PROPOFOL;  Surgeon: Lucilla Lame, MD;  Location: [place];  Service: Gastroenterology;  Laterality: N/A;  Diabetic - insulin  . ESOPHAGOGASTRODUODENOSCOPY    . [place]   growth removed from left kidney   . pain stimulator    . POSTERIOR CERVICAL FUSION/FORAMINOTOMY Right 03/21/2013   Procedure: POSTERIOR L2-3 RIGHT FORAMINOTOMY;  Surgeon: Melina Schools, MD;  Location: [place];  Service: Orthopedics;  Laterality: Right;  . REVISION OF SCAR TISSUE RECTUS MUSCLE    . SMALL BOWEL REPAIR    . SPINAL CORD STIMULATOR BATTERY EXCHANGE N/A 10/17/2012   Procedure: SPINAL CORD STIMULATOR BATTERY REMOVAL;  Surgeon: Melina Schools, MD;  Location: [place];  Service: Orthopedics;  Laterality: N/A;  . SPINAL CORD STIMULATOR BATTERY EXCHANGE N/A 07/22/2015   Procedure: REIMPLANTATION OF SPINAL CORD STIMULATOR BATTERY ;  Surgeon: Melina Schools, MD;  Location: [place];  Service: Orthopedics;  Laterality: N/A;  . TOTAL KNEE ARTHROPLASTY Left   . TOTAL KNEE ARTHROPLASTY Right 07/28/2017   Procedure: RIGHT TOTAL KNEE ARTHROPLASTY;  Surgeon: Sydnee Cabal, MD;  Location: [facility];  Service: Orthopedics;  Laterality: Right;    OB History    Gravida  0   Para  0   Term  0   Preterm  0   AB  0   Living        SAB  0   TAB  0   Ectopic  0   Multiple      Live Births               Home Medications    Prior to Admission medications   Medication Sig Start Date End Date Taking? Authorizing Provider  albuterol (PROVENTIL) (2.5 MG/3ML) 0.083% nebulizer solution Take 3 mLs (2.5 mg total) by nebulization every 6 (six) hours as needed for wheezing or shortness of breath. 07/30/18   Poulose, Bethel Born, NP  albuterol (PROVENTIL) (2.5 MG/3ML) 0.083% nebulizer solution Take 3 mLs (2.5 mg total) by nebulization every 6 (six) hours as needed for wheezing or shortness of breath. 07/30/18   Poulose, Bethel Born, NP  aspirin EC 81 MG tablet Take 81 mg by mouth  daily.    [provider]  atorvastatin (LIPITOR) 80 MG tablet Take 1 tablet (80 mg total) by mouth daily. 05/15/18   Arnetha Courser, MD  azithromycin (ZITHROMAX) 250 MG tablet 2 tabs today and 1 each day afterwards. 07/31/18   Fredderick Severance, NP  benzonatate (TESSALON) 200 MG capsule Take one cap TID PRN cough 08/04/18   Lorin Picket, PA-C  Blood Glucose Calibration (TRUE METRIX LEVEL 1) Low SOLN Use to check fingerstick blood sugars once a day; LON 99 months; E11.9 11/07/17   Lada, Satira Anis,  MD  Blood Glucose Monitoring Suppl (TRUE METRIX METER) w/Device KIT Use to check fingerstick blood sugars once a day; LON 99 months; E11.9 11/02/17   Lada, Satira Anis, MD  budesonide-formoterol (SYMBICORT) 80-4.5 MCG/ACT inhaler Inhale 2 puffs into the lungs 2 (two) times daily. 07/09/18   Hubbard Hartshorn, [name]  chlorpheniramine-HYDROcodone (TUSSIONEX PENNKINETIC ER) 10-8 MG/5ML SUER Take 5 mLs by mouth 2 (two) times daily. 08/04/18   Lorin Picket, PA-C  fexofenadine (ALLEGRA ALLERGY) 180 MG tablet Take 1 tablet (180 mg total) by mouth daily. StartTaking after completing Allegra-D medication 11/30/17   Crecencio Mc P, PA-C  glucose blood (ACCU-CHEK AVIVA) test strip Check fingerstick blood sugars once a day; LON 99 months, Dx E11.9 10/31/17   Lada, Satira Anis, MD  ibuprofen (ADVIL,MOTRIN) 600 MG tablet Take 1 tablet (600 mg total) by mouth every 8 (eight) hours as needed for headache. 12/26/17   Poulose, Bethel Born, NP  Insulin Pen Needle (BD ULTRA-FINE PEN NEEDLES) 29G X 12.7MM MISC For use with injectable GLP-1 inhibitor, Victoza, once a day 10/31/17   Lada, Satira Anis, MD  ipratropium-albuterol (DUONEB) 0.5-2.5 (3) MG/3ML SOLN TAKE 3 MLS BY NEBULIZATION EVERY 4 (FOUR) HOURS AS NEEDED. Patient taking differently: Take 3 mLs by nebulization every 4 (four) hours as needed (for shortness of breath or wheezing).  10/26/16   Roselee Nova, MD  ipratropium-albuterol (DUONEB) 0.5-2.5 (3) MG/3ML  SOLN Take 3 mLs by nebulization every 4 (four) hours as needed. 08/04/18   Lorin Picket, PA-C  Lancets (FREESTYLE) lancets Use to check fingerstick blood sugars once a day; LON 99 months; E11.9 11/07/17   Lada, Satira Anis, MD  loratadine (CLARITIN) 10 MG tablet Take 1 tablet (10 mg total) by mouth daily. 11/06/17   Hubbard Hartshorn, [name]  losartan (COZAAR) 25 MG tablet TAKE 1 TABLET BY MOUTH EVERY DAY 07/23/18   Lada, Satira Anis, MD  meloxicam (MOBIC) 7.5 MG tablet Take 1 tablet (7.5 mg total) by mouth daily. 07/09/18   Hubbard Hartshorn, [name]  mometasone (NASONEX) 50 MCG/ACT nasal spray Place 2 sprays into the nose daily. Patient taking differently: Place 2 sprays into the nose daily as needed (for allergies).  06/02/17   Roselee Nova, MD  montelukast (SINGULAIR) 10 MG tablet Take 1 tablet (10 mg total) by mouth at bedtime. 05/09/18   Arnetha Courser, MD  omeprazole (PRILOSEC) 20 MG capsule Take 1 capsule (20 mg total) by mouth 2 (two) times daily before a meal. 09/22/17   Lada, Satira Anis, MD  oxyCODONE-acetaminophen (PERCOCET) 10-325 MG tablet Take 10-325 tablets by mouth 5 (five) times daily. 03/06/18   Levy Pupa, PA-C  predniSONE (DELTASONE) 20 MG tablet Take 2 tablets (40 mg) daily by mouth 08/04/18   Lorin Picket, PA-C  promethazine-dextromethorphan (PROMETHAZINE-DM) 6.25-15 MG/5ML syrup Take 5 mLs by mouth 4 (four) times daily as needed for cough. 07/30/18   Poulose, Bethel Born, NP  Vitamin D, Cholecalciferol, 1000 units TABS Take 2,000 Units by mouth daily. 09/22/17   Arnetha Courser, MD    Family History Family History  Problem Relation Age of Onset  . Heart attack Mother   . Sarcoidosis Sister     Social History Social History   Tobacco Use  . Smoking status: Former Smoker    Types: Cigarettes    Last attempt to quit: 2006    Years since quitting: 13.9  . Smokeless tobacco: Never Used  . Tobacco comment: quit smoking  23yr ago  Substance Use Topics  . Alcohol use: No  .  Drug use: No     Allergies   Metformin and related and Adhesive [tape]   Review of Systems Review of Systems  Constitutional: Positive for activity change. Negative for chills, fatigue and fever.  Respiratory: Positive for cough, shortness of breath and wheezing.   All other systems reviewed and are negative.    Physical Exam Triage Vital Signs ED Triage Vitals  Enc Vitals Group     BP 08/04/18 1249 (!) 154/75     Pulse Rate 08/04/18 1249 80     Resp 08/04/18 1249 18     Temp 08/04/18 1249 98 F (36.7 C)     Temp Source 08/04/18 1249 Oral     SpO2 08/04/18 1249 97 %     Weight 08/04/18 1250 178 lb (80.7 kg)     Height 08/04/18 1250 '5\' 4"'$  (1.626 m)     Head Circumference --      Peak Flow --      Pain Score 08/04/18 1250 0     Pain Loc --      Pain Edu? --      Excl. in [place] --    No data found.  Updated Vital Signs BP (!) 154/75 (BP Location: Left Arm)   Pulse 80   Temp 98 F (36.7 C) (Oral)   Resp 18   Ht '5\' 4"'$  (1.626 m)   Wt 178 lb (80.7 kg)   SpO2 97%   BMI 30.55 kg/m   Visual Acuity Right Eye Distance:   Left Eye Distance:   Bilateral Distance:    Right Eye Near:   Left Eye Near:    Bilateral Near:     Physical Exam Vitals signs and nursing note reviewed.  Constitutional:      General: She is not in acute distress.    Appearance: Normal appearance. She is normal weight. She is not ill-appearing, toxic-appearing or diaphoretic.  HENT:     Head: Normocephalic.     Right Ear: Tympanic membrane, ear canal and external ear normal.     Left Ear: Tympanic membrane, ear canal and external ear normal.     Nose: Nose normal. No congestion or rhinorrhea.     Mouth/Throat:     Mouth: Mucous membranes are moist.     Pharynx: No oropharyngeal exudate or posterior oropharyngeal erythema.  Eyes:     General:        Right eye: No discharge.        Left eye: No discharge.     Conjunctiva/sclera: Conjunctivae normal.     Pupils: Pupils are equal, round,  and reactive to light.  Neck:     Musculoskeletal: Normal range of motion and neck supple.  Pulmonary:     Effort: Pulmonary effort is normal. No respiratory distress.     Breath sounds: No stridor. Wheezing and rhonchi present. No rales.  Musculoskeletal: Normal range of motion.  Lymphadenopathy:     Cervical: No cervical adenopathy.  Skin:    General: Skin is warm and dry.  Neurological:     General: No focal deficit present.     Mental Status: She is alert and oriented to person, place, and time.  Psychiatric:        Mood and Affect: Mood normal.        Behavior: Behavior normal.      UC Treatments / Results  Labs (all labs ordered are listed,  but only abnormal results are displayed) Labs Reviewed - No data to display  EKG None  Radiology No results found.  Procedures Procedures (including critical care time)  Medications Ordered in UC Medications  ipratropium-albuterol (DUONEB) 0.5-2.5 (3) MG/3ML nebulizer solution 3 mL (3 mLs Nebulization Given 08/04/18 1434)   Patient says symptoms and physical exam improved following the DuoNeb treatment Initial Impression / Assessment and Plan / UC Course  I have reviewed the triage vital signs and the nursing notes.  Pertinent labs & imaging results that were available during my care of the patient were reviewed by me and considered in my medical decision making (see chart for details).   Patient has x-ray findings of bronchial changes and asthmatic exacerbation.  We will drive her DuoNeb since this helped her so well in our clinic.  She was not able to afford the Symbicort prescribed by her primary care.  Also start her on prednisone for 4 days.  She will monitor her sugars very closely.  For symptomatic relief she will take Tessalon Perles and Tussionex.  She follow-up with her primary care physician next week.   Final Clinical Impressions(s) / UC Diagnoses   Final diagnoses:  Moderate persistent asthma with exacerbation     Discharge Instructions   None    ED Prescriptions    Medication Sig Dispense Auth. Provider   ipratropium-albuterol (DUONEB) 0.5-2.5 (3) MG/3ML SOLN Take 3 mLs by nebulization every 4 (four) hours as needed. 360 mL Crecencio Mc P, PA-C   chlorpheniramine-HYDROcodone (TUSSIONEX PENNKINETIC ER) 10-8 MG/5ML SUER Take 5 mLs by mouth 2 (two) times daily. 60 mL Crecencio Mc P, PA-C   benzonatate (TESSALON) 200 MG capsule Take one cap TID PRN cough 30 capsule Crecencio Mc P, PA-C   predniSONE (DELTASONE) 20 MG tablet Take 2 tablets (40 mg) daily by mouth 8 tablet Lorin Picket, PA-C     Controlled Substance Prescriptions [place] Controlled Substance Registry consulted? Not Applicable   Lorin Picket, PA-C 08/04/18 1721

## 2018-08-04 NOTE — ED Triage Notes (Addendum)
Pt sick x past 10 days and saw her PCP who diagnosed her with PNA. Finished ABX and not better. Pt reports cough is non-productive. Had CXR on Wednesday

## 2018-08-12 ENCOUNTER — Other Ambulatory Visit: Payer: Self-pay | Admitting: Family Medicine

## 2018-08-13 ENCOUNTER — Other Ambulatory Visit: Payer: Self-pay | Admitting: Pharmacy Technician

## 2018-08-13 NOTE — Patient Outreach (Signed)
[facility] Care Management  08/13/2018  [name] 1945/12/30 [id number]   Received patient portion of [employer] patient assistance application for Proventil HFA. Prepared completed application to be mailed to company.  Will follow up with company in 10-14 business days  [employer]ana [employer]hley N. Effie Shyoleman, CPhT Certified Pharmacy Technician [facility] Care Management Direct Dial:[phone]

## 2018-08-13 NOTE — Telephone Encounter (Signed)
Left detailed voicemail

## 2018-08-13 NOTE — Telephone Encounter (Signed)
Lab Results  Component Value Date   ALT 17 05/09/2018   ALT 26 11/26/2013   Lab Results  Component Value Date   CHOL 175 05/09/2018   HDL 61 05/09/2018   LDLCALC 98 05/09/2018   TRIG 71 05/09/2018   CHOLHDL 2.9 05/09/2018   Please remind patient that we would like to see what the higher dose of cholesterol medicine is doing See orders from 05/16/18 I'll send in one week of medicine and we'll check to verify that's going to be the right dose

## 2018-08-14 ENCOUNTER — Ambulatory Visit: Payer: Medicare HMO | Admitting: Nurse Practitioner

## 2018-08-16 ENCOUNTER — Ambulatory Visit (INDEPENDENT_AMBULATORY_CARE_PROVIDER_SITE_OTHER): Payer: Medicare HMO | Admitting: Nurse Practitioner

## 2018-08-16 ENCOUNTER — Encounter: Payer: Self-pay | Admitting: Nurse Practitioner

## 2018-08-16 VITALS — BP 138/74 | HR 102 | Temp 97.8°F | Resp 18 | Ht 66.0 in | Wt 175.7 lb

## 2018-08-16 DIAGNOSIS — J4541 Moderate persistent asthma with (acute) exacerbation: Secondary | ICD-10-CM | POA: Diagnosis not present

## 2018-08-16 DIAGNOSIS — E0829 Diabetes mellitus due to underlying condition with other diabetic kidney complication: Secondary | ICD-10-CM | POA: Diagnosis not present

## 2018-08-16 DIAGNOSIS — R05 Cough: Secondary | ICD-10-CM

## 2018-08-16 DIAGNOSIS — R809 Proteinuria, unspecified: Secondary | ICD-10-CM

## 2018-08-16 DIAGNOSIS — R059 Cough, unspecified: Secondary | ICD-10-CM

## 2018-08-16 DIAGNOSIS — Z794 Long term (current) use of insulin: Secondary | ICD-10-CM

## 2018-08-16 MED ORDER — FLUTICASONE FUROATE-VILANTEROL 100-25 MCG/INH IN AEPB: 1.0000 | INHALATION_SPRAY | Freq: Every day | RESPIRATORY_TRACT | 1 refills | Status: DC

## 2018-08-16 MED ORDER — MOMETASONE FURO-FORMOTEROL FUM 100-5 MCG/ACT IN AERO: 2.0000 | INHALATION_SPRAY | Freq: Two times a day (BID) | RESPIRATORY_TRACT | 12 refills | Status: DC

## 2018-08-16 MED ORDER — IPRATROPIUM-ALBUTEROL 0.5-2.5 (3) MG/3ML IN SOLN: RESPIRATORY_TRACT | 0 refills | Status: DC

## 2018-08-16 MED ORDER — LIRAGLUTIDE 18 MG/3ML ~~LOC~~ SOPN: 0.6000 mg | PEN_INJECTOR | Freq: Every day | SUBCUTANEOUS | 4 refills | Status: DC

## 2018-08-16 MED ORDER — DM-GUAIFENESIN ER 30-600 MG PO TB12: 1.0000 | ORAL_TABLET | Freq: Two times a day (BID) | ORAL | 0 refills | Status: DC

## 2018-08-16 MED ORDER — PREDNISONE 10 MG (21) PO TBPK: ORAL_TABLET | ORAL | 0 refills | Status: DC

## 2018-08-16 MED ORDER — PROMETHAZINE-DM 6.25-15 MG/5ML PO SYRP: 5.0000 mL | ORAL_SOLUTION | Freq: Four times a day (QID) | ORAL | 0 refills | Status: DC | PRN

## 2018-08-16 NOTE — Progress Notes (Signed)
Name: Lori Potts   MRN: [id number]    DOB: 12/14/1945   Date:08/16/2018       Progress Note  Subjective  Chief Complaint  Chief Complaint  Patient presents with  . Follow-up    HPI  Patient presents with shortness of breath, cough, wheezing ongoing for a month, has had z-pack, prednisone and multiple cough medicines without relief. This seems to progressively getting worse, states shortness of breath is worse at night, with exertion and is now having some at rest with coughing episodes. States feels like she has chest congestion that just wont come out. No fevers or chills.   Patient states she is not taking Symbicort because it is too expensive. Patient has been using albuterol neb and inhaler- states as often as she needs.   Had 4 days of '40mg'$  of prednisone without relief- of symptoms high sugars was in the 160's.   Patient Active Problem List   Diagnosis Date Noted  . Microalbuminuria 01/22/2018  . Anemia 09/22/2017  . S/P knee replacement 07/28/2017  . Allergic rhinitis 05/26/2017  . Vitamin D deficiency 05/19/2017  . Chronic nonintractable headache 05/18/2017  . Special screening for malignant neoplasms, colon   . Gastroesophageal reflux disease   . Gastritis without bleeding   . 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 Insomnia 11/29/2016  . 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 Vasomotor flushing 04/19/2016  . Abdominal wall pain in right flank 03/29/2016  . Diverticulosis 02/24/2016  . Hypercholesterolemia 06/16/2015  . FHx: migraine headaches 04/13/2015  . Chronic pain 04/13/2015  . Neck pain 02/26/2015  . Status post lumbar surgery 02/26/2015  . Abnormal ECG 01/27/2015  . Angina pectoris ([place]) 01/27/2015  . 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 BP (high blood pressure) 01/27/2015    Past Medical History:  Diagnosis Date  . Arthritis   . 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HCC)  03/02/2017  . Cataract    right eye but immature  . Essential hypertension, benign    takes Lisinopril-HCTZ daily  . GERD (gastroesophageal reflux disease)   . Headache    sinus  . History of colon polyps    benign  . History of migraine   . History of shingles   . Hyperlipidemia    takes Lipitor daily  . Low back pain   . Nocturia   . S/P insertion of spinal cord stimulator   . Seasonal allergies    takes Singulair daily as needed  . Type II or unspecified type diabetes mellitus without mention of complication, not stated as uncontrolled   . Vitamin D deficiency    takes Vit D weekly  . Weakness    numbness and tingling left arm  . Wears dentures    full upper and lower    Past Surgical History:  Procedure Laterality Date  . ABDOMINAL HYSTERECTOMY    . ANTERIOR CERVICAL DECOMP/DISCECTOMY FUSION N/A 02/26/2015   Procedure: ANTERIOR CERVICAL DISCECTOMY FUSION C4-5 (1 LEVEL);  Surgeon: Melina Schools, MD;  Location: [facility];  Service: Orthopedics;  Laterality: N/A;  . ANTERIOR LAT LUMBAR FUSION N/A 03/21/2013   Procedure: ANTERIOR LATERAL LUMBAR FUSION 1 LEVEL/ XLIF L3-L4 ;  Surgeon: Melina Schools, MD;  Location: [facility];  Service: Orthopedics;  Laterality: N/A;  . APPENDECTOMY    . AUGMENTATION MAMMAPLASTY Bilateral 1978  . BACK SURGERY    . BACK SURGERY     Lumbar fusion x 2  .  BREAST EXCISIONAL BIOPSY Right 1970  . BREAST SURGERY  1990   Augementation  . CARDIAC CATHETERIZATION  5/12   ef 55%  . CARDIAC CATHETERIZATION  10/2010   [facility]; EF 55%  . CARDIAC CATHETERIZATION Left 02/16/2016   Procedure: Left Heart Cath and Coronary Angiography;  Surgeon: Yolonda Kida, MD;  Location: [facility];  Service: Cardiovascular;  Laterality: Left;  . COLONOSCOPY    . COLONOSCOPY WITH PROPOFOL N/A 04/10/2017   Procedure: COLONOSCOPY WITH PROPOFOL;  Surgeon: Lucilla Lame, MD;  Location: [facility];  Service: Gastroenterology;  Laterality: N/A;  Diabetic - insulin  .  ESOPHAGOGASTRODUODENOSCOPY    . [place]   growth removed from left kidney   . pain stimulator    . POSTERIOR CERVICAL FUSION/FORAMINOTOMY Right 03/21/2013   Procedure: POSTERIOR L2-3 RIGHT FORAMINOTOMY;  Surgeon: Melina Schools, MD;  Location: [facility];  Service: Orthopedics;  Laterality: Right;  . REVISION OF SCAR TISSUE RECTUS MUSCLE    . SMALL BOWEL REPAIR    . SPINAL CORD STIMULATOR BATTERY EXCHANGE N/A 10/17/2012   Procedure: SPINAL CORD STIMULATOR BATTERY REMOVAL;  Surgeon: Melina Schools, MD;  Location: [facility];  Service: Orthopedics;  Laterality: N/A;  . SPINAL CORD STIMULATOR BATTERY EXCHANGE N/A 07/22/2015   Procedure: REIMPLANTATION OF SPINAL CORD STIMULATOR BATTERY ;  Surgeon: Melina Schools, MD;  Location: [facility];  Service: Orthopedics;  Laterality: N/A;  . TOTAL KNEE ARTHROPLASTY Left   . TOTAL KNEE ARTHROPLASTY Right 07/28/2017   Procedure: RIGHT TOTAL KNEE ARTHROPLASTY;  Surgeon: Sydnee Cabal, MD;  Location: [facility];  Service: Orthopedics;  Laterality: Right;    Social History   Tobacco Use  . Smoking status: Former Smoker    Types: Cigarettes    Last attempt to quit: 2006    Years since quitting: 13.9  . Smokeless tobacco: Never Used  . Tobacco comment: quit smoking 29yr ago  Substance Use Topics  . Alcohol use: No     Current Outpatient Medications:  .  albuterol (PROVENTIL) (2.5 MG/3ML) 0.083% nebulizer solution, Take 3 mLs (2.5 mg total) by nebulization every 6 (six) hours as needed for wheezing or shortness of breath., Disp: 75 mL, Rfl: 1 .  albuterol (PROVENTIL) (2.5 MG/3ML) 0.083% nebulizer solution, Take 3 mLs (2.5 mg total) by nebulization every 6 (six) hours as needed for wheezing or shortness of breath., Disp: 150 mL, Rfl: 1 .  aspirin EC 81 MG tablet, Take 81 mg by mouth daily., Disp: , Rfl:  .  atorvastatin (LIPITOR) 80 MG tablet, TAKE 1 TABLET BY MOUTH EVERY DAY, Disp: 7 tablet, Rfl: 0 .  Blood Glucose Calibration (TRUE METRIX LEVEL 1) Low SOLN,  Use to check fingerstick blood sugars once a day; LON 99 months; E11.9, Disp: 1 each, Rfl: 1 .  Blood Glucose Monitoring Suppl (TRUE METRIX METER) w/Device KIT, Use to check fingerstick blood sugars once a day; LON 99 months; E11.9, Disp: 1 kit, Rfl: 0 .  budesonide-formoterol (SYMBICORT) 80-4.5 MCG/ACT inhaler, Inhale 2 puffs into the lungs 2 (two) times daily., Disp: 1 Inhaler, Rfl: 3 .  fexofenadine (ALLEGRA ALLERGY) 180 MG tablet, Take 1 tablet (180 mg total) by mouth daily. StartTaking after completing Allegra-D medication, Disp: 30 tablet, Rfl: 0 .  glucose blood (ACCU-CHEK AVIVA) test strip, Check fingerstick blood sugars once a day; LON 99 months, Dx E11.9, Disp: 100 each, Rfl: 3 .  ibuprofen (ADVIL,MOTRIN) 600 MG tablet, Take 1 tablet (600 mg total) by mouth every 8 (  eight) hours as needed for headache., Disp: 30 tablet, Rfl: 0 .  Insulin Pen Needle (BD ULTRA-FINE PEN NEEDLES) 29G X 12.7MM MISC, For use with injectable GLP-1 inhibitor, Victoza, once a day, Disp: 100 each, Rfl: 3 .  ipratropium-albuterol (DUONEB) 0.5-2.5 (3) MG/3ML SOLN, TAKE 3 MLS BY NEBULIZATION EVERY 4 (FOUR) HOURS AS NEEDED. (Patient taking differently: Take 3 mLs by nebulization every 4 (four) hours as needed (for shortness of breath or wheezing). ), Disp: 360 mL, Rfl: 3 .  ipratropium-albuterol (DUONEB) 0.5-2.5 (3) MG/3ML SOLN, Take 3 mLs by nebulization every 4 (four) hours as needed., Disp: 360 mL, Rfl: 0 .  Lancets (FREESTYLE) lancets, Use to check fingerstick blood sugars once a day; LON 99 months; E11.9, Disp: 100 each, Rfl: 3 .  loratadine (CLARITIN) 10 MG tablet, Take 1 tablet (10 mg total) by mouth daily., Disp: 90 tablet, Rfl: 2 .  losartan (COZAAR) 25 MG tablet, TAKE 1 TABLET BY MOUTH EVERY DAY, Disp: 30 tablet, Rfl: 5 .  meloxicam (MOBIC) 7.5 MG tablet, Take 1 tablet (7.5 mg total) by mouth daily., Disp: 20 tablet, Rfl: 0 .  mometasone (NASONEX) 50 MCG/ACT nasal spray, Place 2 sprays into the nose daily.  (Patient taking differently: Place 2 sprays into the nose daily as needed (for allergies). ), Disp: 17 g, Rfl: 0 .  montelukast (SINGULAIR) 10 MG tablet, Take 1 tablet (10 mg total) by mouth at bedtime., Disp: 30 tablet, Rfl: 11 .  omeprazole (PRILOSEC) 20 MG capsule, Take 1 capsule (20 mg total) by mouth 2 (two) times daily before a meal., Disp: , Rfl:  .  oxyCODONE-acetaminophen (PERCOCET) 10-325 MG tablet, Take 10-325 tablets by mouth 5 (five) times daily., Disp: , Rfl: 0 .  Vitamin D, Cholecalciferol, 1000 units TABS, Take 2,000 Units by mouth daily., Disp: , Rfl:   Allergies  Allergen Reactions  . Metformin And Related Diarrhea  . Adhesive [Tape] Rash and Other (See Comments)    Regular tape is ok, allergy is to paper tape    ROS  No other specific complaints in a complete review of systems (except as listed in HPI above).  Objective  Vitals:   08/16/18 0917  BP: 138/74  Pulse: (!) 102  Resp: 18  Temp: 97.8 F (36.6 C)  TempSrc: Oral  SpO2: 95%  Weight: 175 lb 11.2 oz (79.7 kg)  Height: '5\' 6"'$  (1.676 m)     Body mass index is 28.36 kg/m.  Nursing Note and Vital Signs reviewed.  Physical Exam Constitutional:      Appearance: Normal appearance.  HENT:     Right Ear: Tympanic membrane, ear canal and external ear normal.     Left Ear: Tympanic membrane, ear canal and external ear normal.     Nose: Congestion present.     Mouth/Throat:     Mouth: Mucous membranes are moist.     Pharynx: Oropharynx is clear. Posterior oropharyngeal erythema present. No oropharyngeal exudate.  Eyes:     Pupils: Pupils are equal, round, and reactive to light.  Cardiovascular:     Rate and Rhythm: Tachycardia present.     Pulses: Normal pulses.  Pulmonary:     Effort: Pulmonary effort is normal.     Breath sounds: Wheezing (bilateral expiratory and inspiratory wheezing throughotu) present.     Comments: Dry cough Lymphadenopathy:     Cervical: No cervical adenopathy.  Skin:     General: Skin is warm and dry.  Neurological:     General: No  focal deficit present.     Mental Status: She is alert.  Psychiatric:        Mood and Affect: Mood normal.        Thought Content: Thought content normal.      No results found for this or any previous visit (from the past 48 hour(s)).  Assessment & Plan  1. Moderate persistent asthma with exacerbation - Please get and take Breo one puff a day using coupon. Care management team is working on getting you enrolled in a patient assistance program for the albuterol and another maintanence medication called Dulera. When you get dulera you will use this as a REPLACEMENT for breo. Do not take BREO and DULERA together.  - Please start prednisone taper and make sure you are eating health foods and drinking plenty of water to prevent high blood sugar.  - Please take 55m of duoneb ipratropium-albuterol nebulizer treatment if having significant wheezing, can repeat in 20 minutes once if needed. Can take every 6-8 hours.  - Take cough medicine as prescribed and musinex as directed - Follow up on Monday, if your symptoms are not improving after treatment when you get home or you develop worsening shortness of breath, chest tightness, wheezing or cough or any new or concerning symptoms please go to the ER.  - mometasone-formoterol (DULERA) 100-5 MCG/ACT AERO; Inhale 2 puffs into the lungs 2 (two) times daily.  Dispense: 1 Inhaler; Refill: 12 - promethazine-dextromethorphan (PROMETHAZINE-DM) 6.25-15 MG/5ML syrup; Take 5 mLs by mouth 4 (four) times daily as needed for cough.  Dispense: 118 mL; Refill: 0 - fluticasone furoate-vilanterol (BREO ELLIPTA) 100-25 MCG/INH AEPB; Inhale 1 puff into the lungs daily.  Dispense: 1 each; Refill: 1 - ipratropium-albuterol (DUONEB) 0.5-2.5 (3) MG/3ML SOLN; Can take 358mevery 20 minutes for 2 doses as needed for wheezing  Dispense: 50 mL; Refill: 0 - predniSONE (STERAPRED UNI-PAK 21 TAB) 10 MG (21) TBPK tablet;  Take as directed  Dispense: 21 tablet; Refill: 0  2. Cough - promethazine-dextromethorphan (PROMETHAZINE-DM) 6.25-15 MG/5ML syrup; Take 5 mLs by mouth 4 (four) times daily as needed for cough.  Dispense: 118 mL; Refill: 0 - dextromethorphan-guaiFENesin (MUCINEX DM) 30-600 MG 12hr tablet; Take 1 tablet by mouth 2 (two) times daily.  Dispense: 30 tablet; Refill: 0  3. 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  Dispense: 1 pen; Refill: 4    -Red flags and when to present for emergency care or RTC including fever >101.48F, chest pain, shortness of breath, new/worsening/un-resolving symptoms, eviewed with patient at time of visit. Follow up and care instructions discussed and provided in AVS. Face-to-face time with patient was more than 25 minutes, >50% time spent counseling and coordination of care

## 2018-08-16 NOTE — Patient Instructions (Addendum)
-   Please get and take Breo one puff a day using coupon. Care management team is working on getting you enrolled in a patient assistance program for the albuterol and another maintanence medication called Dulera. When you get dulera you will use this as a REPLACEMENT for breo. Do not take BREO and DULERA together.  - Please start prednisone taper and make sure you are eating health foods and drinking plenty of water to prevent high blood sugar.  - Please take 3mL of duoneb ipratropium-albuterol nebulizer treatment if having significant wheezing, can repeat in 20 minutes once if needed. Can take every 6-8 hours.  - Take cough medicine as prescribed and musinex as directed - Follow up on Monday, if your symptoms are not improving after treatment when you get home or you develop worsening shortness of breath, chest tightness, wheezing or cough or any new or concerning symptoms please go to the ER.

## 2018-08-20 ENCOUNTER — Ambulatory Visit: Payer: Medicare HMO | Admitting: Nurse Practitioner

## 2018-08-21 ENCOUNTER — Ambulatory Visit (INDEPENDENT_AMBULATORY_CARE_PROVIDER_SITE_OTHER): Payer: Medicare HMO | Admitting: Nurse Practitioner

## 2018-08-21 ENCOUNTER — Encounter: Payer: Self-pay | Admitting: Nurse Practitioner

## 2018-08-21 VITALS — BP 138/72 | HR 82 | Temp 97.7°F | Resp 16 | Ht 66.0 in | Wt 176.3 lb

## 2018-08-21 DIAGNOSIS — J4541 Moderate persistent asthma with (acute) exacerbation: Secondary | ICD-10-CM

## 2018-08-21 DIAGNOSIS — R232 Flushing: Secondary | ICD-10-CM

## 2018-08-21 NOTE — Patient Instructions (Addendum)
To help with hot flashes:  - Behavioral measures: such as lowering room temperature, using fans, dressing in layers of clothing that can be easily shed, and avoiding triggers (such as spicy foods and stressful situations), can help reduce the number of hot flashes. Try taking vitamin E supplements- 100 units 3-4 times a week. This is a fat soluble vitamin so you do not want to take more than needed. - Please return for lab visit only in the next 2 weeks for blood draw and 24 hour urine collection to further evaluate your hot flashes.  Asthma, Adult Asthma is a long-term (chronic) condition in which the airways get tight and narrow. The airways are the breathing passages that lead from the nose and mouth down into the lungs. A person with asthma will have times when symptoms get worse. These are called asthma attacks. They can cause coughing, whistling sounds when you breathe (wheezing), shortness of breath, and chest pain. They can make it hard to breathe. There is no cure for asthma, but medicines and lifestyle changes can help control it. There are many things that can bring on an asthma attack or make asthma symptoms worse (triggers). Common triggers include:  Mold.  Dust.  Cigarette smoke.  Cockroaches.  Things that can cause allergy symptoms (allergens). These include animal skin flakes (dander) and pollen from trees or grass.  Things that pollute the air. These may include household cleaners, wood smoke, smog, or chemical odors.  Cold air, weather changes, and wind.  Crying or laughing hard.  Stress.  Certain medicines or drugs.  Certain foods such as dried fruit, potato chips, and grape juice.  Infections, such as a cold or the flu.  Certain medical conditions or diseases.  Exercise or tiring activities. Asthma may be treated with medicines and by staying away from the things that cause asthma attacks. Types of medicines may include:  Controller medicines. These help  prevent asthma symptoms. They are usually taken every day.  Fast-acting reliever or rescue medicines. These quickly relieve asthma symptoms. They are used as needed and provide short-term relief.  Allergy medicines if your attacks are brought on by allergens.  Medicines to help control the body's defense (immune) system. Follow these instructions at home: Avoiding triggers in your home  Change your heating and air conditioning filter often.  Limit your use of fireplaces and wood stoves.  Get rid of pests (such as roaches and mice) and their droppings.  Throw away plants if you see mold on them.  Clean your floors. Dust regularly. Use cleaning products that do not smell.  Have someone vacuum when you are not home. Use a vacuum cleaner with a HEPA filter if possible.  Replace carpet with wood, tile, or vinyl flooring. Carpet can trap animal skin flakes and dust.  Use allergy-proof pillows, mattress covers, and box spring covers.  Wash bed sheets and blankets every week in hot water. Dry them in a dryer.  Keep your bedroom free of any triggers.  Avoid pets and keep windows closed when things that cause allergy symptoms are in the air.  Use blankets that are made of polyester or cotton.  Clean bathrooms and kitchens with bleach. If possible, have someone repaint the walls in these rooms with mold-resistant paint. Keep out of the rooms that are being cleaned and painted.  Wash your hands often with soap and water. If soap and water are not available, use hand sanitizer.  Do not allow anyone to smoke in your  home. General instructions  Take over-the-counter and prescription medicines only as told by your doctor. ? Talk with your doctor if you have questions about how or when to take your medicines. ? Make note if you need to use your medicines more often than usual.  Do not use any products that contain nicotine or tobacco, such as cigarettes and e-cigarettes. If you need help  quitting, ask your doctor.  Stay away from secondhand smoke.  Avoid doing things outdoors when allergen counts are high and when air quality is low.  Wear a ski mask when doing outdoor activities in the winter. The mask should cover your nose and mouth. Exercise indoors on cold days if you can.  Warm up before you exercise. Take time to cool down after exercise.  Use a peak flow meter as told by your doctor. A peak flow meter is a tool that measures how well the lungs are working.  Keep track of the peak flow meter's readings. Write them down.  Follow your asthma action plan. This is a written plan for taking care of your asthma and treating your attacks.  Make sure you get all the shots (vaccines) that your doctor recommends. Ask your doctor about a flu shot and a pneumonia shot.  Keep all follow-up visits as told by your doctor. This is important. Contact a doctor if:  You have wheezing, shortness of breath, or a cough even while taking medicine to prevent attacks.  The mucus you cough up (sputum) is thicker than usual.  The mucus you cough up changes from clear or white to yellow, green, gray, or bloody.  You have problems from the medicine you are taking, such as: ? A rash. ? Itching. ? Swelling. ? Trouble breathing.  You need reliever medicines more than 2-3 times a week.  Your peak flow reading is still at 50-79% of your personal best after following the action plan for 1 hour.  You have a fever. Get help right away if:  You seem to be worse and are not responding to medicine during an asthma attack.  You are short of breath even at rest.  You get short of breath when doing very little activity.  You have trouble eating, drinking, or talking.  You have chest pain or tightness.  You have a fast heartbeat.  Your lips or fingernails start to turn blue.  You are light-headed or dizzy, or you faint.  Your peak flow is less than 50% of your personal  best.  You feel too tired to breathe normally. Summary  Asthma is a long-term (chronic) condition in which the airways get tight and narrow. An asthma attack can make it hard to breathe.  Asthma cannot be cured, but medicines and lifestyle changes can help control it.  Make sure you understand how to avoid triggers and how and when to use your medicines. This information is not intended to replace advice given to you by your health care provider. Make sure you discuss any questions you have with your health care provider. Document Released: 01/25/2008 Document Revised: 09/12/2016 Document Reviewed: 09/12/2016 [employer] Interactive Patient Education  2019 [employer]

## 2018-08-21 NOTE — Progress Notes (Signed)
Name: Lori Potts   MRN: [id number]    DOB: 01/10/1946   Date:08/21/2018       Progress Note  Subjective  Chief Complaint  Chief Complaint  Patient presents with  . Follow-up    HPI  Feels much better, states the best she has felt in the past few months. She is able to walk a long way, take deep breath without issue. She is taking breo daily, finished her prednisone this morning. She is not using her rescue inhaler anymore.   Blood sugars have been all over since prednisone her Highest 230's - drinking lots of water and eating healthy foods.  Today blood sugar was 132.  She also mentioned has been having episodes of hot flashes since she was 27- states much less frequent than she has had it in the past- states 0-2 time a day now. States episodes last for a few seconds, sometimes accompanied with headache and tachycardia. This is very frustrating to her to still be dealing with and states has some increased anxiety and agitation related to this.   Patient Active Problem List   Diagnosis Date Noted  . Microalbuminuria 01/22/2018  . Anemia 09/22/2017  . S/P knee replacement 07/28/2017  . Allergic rhinitis 05/26/2017  . Vitamin D deficiency 05/19/2017  . Chronic nonintractable headache 05/18/2017  . Special screening for malignant neoplasms, colon   . Gastroesophageal reflux disease   . Gastritis without bleeding   . 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 Insomnia 11/29/2016  . 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 Vasomotor flushing 04/19/2016  . Abdominal wall pain in right flank 03/29/2016  . Diverticulosis 02/24/2016  . Hypercholesterolemia 06/16/2015  . FHx: migraine headaches 04/13/2015  . Chronic pain 04/13/2015  . Neck pain 02/26/2015  . Status post lumbar surgery 02/26/2015  . Abnormal ECG 01/27/2015  . Angina pectoris ([place]) 01/27/2015  . 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 BP (high blood pressure) 01/27/2015    Past Medical History:  Diagnosis Date  . Arthritis   . 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 Cataract    right eye but immature  . Essential hypertension, benign    takes Lisinopril-HCTZ daily  . GERD (gastroesophageal reflux disease)   . Headache    sinus  . History of colon polyps    benign  . History of migraine   . History of shingles   . Hyperlipidemia    takes Lipitor daily  . Low back pain   . Nocturia   . S/P insertion of spinal cord stimulator   . Seasonal allergies    takes Singulair daily as needed  . Type II or unspecified type diabetes mellitus without mention of complication, not stated as uncontrolled   . Vitamin D deficiency    takes Vit D weekly  . Weakness    numbness and tingling left arm  . Wears dentures    full upper and lower    Past Surgical History:  Procedure Laterality Date  . ABDOMINAL HYSTERECTOMY    . ANTERIOR CERVICAL DECOMP/DISCECTOMY FUSION N/A 02/26/2015   Procedure: ANTERIOR CERVICAL DISCECTOMY FUSION C4-5 (1 LEVEL);  Surgeon: Melina Schools, MD;  Location: [place];  Service: Orthopedics;  Laterality: N/A;  . ANTERIOR LAT LUMBAR FUSION N/A 03/21/2013   Procedure: ANTERIOR LATERAL LUMBAR FUSION 1 LEVEL/ XLIF L3-L4 ;  Surgeon: Melina Schools, MD;  Location: [place];  Service: Orthopedics;  Laterality: N/A;  .  APPENDECTOMY    . AUGMENTATION MAMMAPLASTY Bilateral 1978  . BACK SURGERY    . BACK SURGERY     Lumbar fusion x 2  . BREAST EXCISIONAL BIOPSY Right 1970  . BREAST SURGERY  1990   Augementation  . CARDIAC CATHETERIZATION  5/12   ef 55%  . CARDIAC CATHETERIZATION  10/2010   [facility]; EF 55%  . CARDIAC CATHETERIZATION Left 02/16/2016   Procedure: Left Heart Cath and Coronary Angiography;  Surgeon: Yolonda Kida, MD;  Location: [place] [facility];  Service: Cardiovascular;  Laterality: Left;  . COLONOSCOPY    . COLONOSCOPY WITH PROPOFOL N/A 04/10/2017   Procedure: COLONOSCOPY WITH PROPOFOL;   Surgeon: Lucilla Lame, MD;  Location: [place];  Service: Gastroenterology;  Laterality: N/A;  Diabetic - insulin  . ESOPHAGOGASTRODUODENOSCOPY    . [place]   growth removed from left kidney   . pain stimulator    . POSTERIOR CERVICAL FUSION/FORAMINOTOMY Right 03/21/2013   Procedure: POSTERIOR L2-3 RIGHT FORAMINOTOMY;  Surgeon: Melina Schools, MD;  Location: [place];  Service: Orthopedics;  Laterality: Right;  . REVISION OF SCAR TISSUE RECTUS MUSCLE    . SMALL BOWEL REPAIR    . SPINAL CORD STIMULATOR BATTERY EXCHANGE N/A 10/17/2012   Procedure: SPINAL CORD STIMULATOR BATTERY REMOVAL;  Surgeon: Melina Schools, MD;  Location: [place];  Service: Orthopedics;  Laterality: N/A;  . SPINAL CORD STIMULATOR BATTERY EXCHANGE N/A 07/22/2015   Procedure: REIMPLANTATION OF SPINAL CORD STIMULATOR BATTERY ;  Surgeon: Melina Schools, MD;  Location: [place];  Service: Orthopedics;  Laterality: N/A;  . TOTAL KNEE ARTHROPLASTY Left   . TOTAL KNEE ARTHROPLASTY Right 07/28/2017   Procedure: RIGHT TOTAL KNEE ARTHROPLASTY;  Surgeon: Sydnee Cabal, MD;  [facility];  Service: Orthopedics;  Laterality: Right;    Social History   Tobacco Use  . Smoking status: Former Smoker    Types: Cigarettes    Last attempt to quit: 2006    Years since quitting: 14.0  . Smokeless tobacco: Never Used  . Tobacco comment: quit smoking 43yr ago  Substance Use Topics  . Alcohol use: No     Current Outpatient Medications:  .  aspirin EC 81 MG tablet, Take 81 mg by mouth daily., Disp: , Rfl:  .  atorvastatin (LIPITOR) 80 MG tablet, TAKE 1 TABLET BY MOUTH EVERY DAY, Disp: 7 tablet, Rfl: 0 .  Blood Glucose Calibration (TRUE METRIX LEVEL 1) Low SOLN, Use to check fingerstick blood sugars once a day; LON 99 months; E11.9, Disp: 1 each, Rfl: 1 .  Blood Glucose Monitoring Suppl (TRUE METRIX METER) w/Device KIT, Use to check fingerstick blood sugars once a day; LON 99 months; E11.9, Disp: 1 kit, Rfl: 0 .   dextromethorphan-guaiFENesin (MUCINEX DM) 30-600 MG 12hr tablet, Take 1 tablet by mouth 2 (two) times daily., Disp: 30 tablet, Rfl: 0 .  fexofenadine (ALLEGRA ALLERGY) 180 MG tablet, Take 1 tablet (180 mg total) by mouth daily. StartTaking after completing Allegra-D medication, Disp: 30 tablet, Rfl: 0 .  fluticasone furoate-vilanterol (BREO ELLIPTA) 100-25 MCG/INH AEPB, Inhale 1 puff into the lungs daily., Disp: 1 each, Rfl: 1 .  glucose blood (ACCU-CHEK AVIVA) test strip, Check fingerstick blood sugars once a day; LON 99 months, Dx E11.9, Disp: 100 each, Rfl: 3 .  ibuprofen (ADVIL,MOTRIN) 600 MG tablet, Take 1 tablet (600 mg total) by mouth every 8 (eight) hours as needed for headache., Disp: 30 tablet, Rfl: 0 .  Insulin Pen Needle (BD  ULTRA-FINE PEN NEEDLES) 29G X 12.7MM MISC, For use with injectable GLP-1 inhibitor, Victoza, once a day, Disp: 100 each, Rfl: 3 .  ipratropium-albuterol (DUONEB) 0.5-2.5 (3) MG/3ML SOLN, Can take 48m every 20 minutes for 2 doses as needed for wheezing, Disp: 50 mL, Rfl: 0 .  Lancets (FREESTYLE) lancets, Use to check fingerstick blood sugars once a day; LON 99 months; E11.9, Disp: 100 each, Rfl: 3 .  liraglutide (VICTOZA) 18 MG/3ML SOPN, Inject 0.1 mLs (0.6 mg total) into the skin daily., Disp: 1 pen, Rfl: 4 .  loratadine (CLARITIN) 10 MG tablet, Take 1 tablet (10 mg total) by mouth daily., Disp: 90 tablet, Rfl: 2 .  losartan (COZAAR) 25 MG tablet, TAKE 1 TABLET BY MOUTH EVERY DAY, Disp: 30 tablet, Rfl: 5 .  meloxicam (MOBIC) 7.5 MG tablet, Take 1 tablet (7.5 mg total) by mouth daily., Disp: 20 tablet, Rfl: 0 .  mometasone (NASONEX) 50 MCG/ACT nasal spray, Place 2 sprays into the nose daily. (Patient taking differently: Place 2 sprays into the nose daily as needed (for allergies). ), Disp: 17 g, Rfl: 0 .  mometasone-formoterol (DULERA) 100-5 MCG/ACT AERO, Inhale 2 puffs into the lungs 2 (two) times daily., Disp: 1 Inhaler, Rfl: 12 .  omeprazole (PRILOSEC) 20 MG capsule,  Take 1 capsule (20 mg total) by mouth 2 (two) times daily before a meal., Disp: , Rfl:  .  oxyCODONE-acetaminophen (PERCOCET) 10-325 MG tablet, Take 10-325 tablets by mouth 5 (five) times daily., Disp: , Rfl: 0 .  predniSONE (STERAPRED UNI-PAK 21 TAB) 10 MG (21) TBPK tablet, Take as directed, Disp: 21 tablet, Rfl: 0 .  promethazine-dextromethorphan (PROMETHAZINE-DM) 6.25-15 MG/5ML syrup, Take 5 mLs by mouth 4 (four) times daily as needed for cough., Disp: 118 mL, Rfl: 0 .  Vitamin D, Cholecalciferol, 1000 units TABS, Take 2,000 Units by mouth daily., Disp: , Rfl:   Allergies  Allergen Reactions  . Metformin And Related Diarrhea  . Adhesive [Tape] Rash and Other (See Comments)    Regular tape is ok, allergy is to paper tape    ROS   No other specific complaints in a complete review of systems (except as listed in HPI above).  Objective  Vitals:   08/21/18 0919  BP: 138/72  Pulse: 82  Resp: 16  Temp: 97.7 F (36.5 C)  TempSrc: Oral  SpO2: 98%  Weight: 176 lb 4.8 oz (80 kg)  Height: 5' 6" (1.676 m)    Body mass index is 28.46 kg/m.  Nursing Note and Vital Signs reviewed.  Physical Exam Vitals signs reviewed.  Constitutional:      Appearance: She is well-developed.  HENT:     Head: Normocephalic and atraumatic.     Nose: Nose normal.  Eyes:     Conjunctiva/sclera: Conjunctivae normal.  Neck:     Musculoskeletal: Normal range of motion and neck supple.     Vascular: No carotid bruit.  Cardiovascular:     Heart sounds: Normal heart sounds.  Pulmonary:     Effort: Pulmonary effort is normal.     Breath sounds: Normal breath sounds.  Abdominal:     Palpations: Abdomen is soft.  Musculoskeletal: Normal range of motion.  Skin:    General: Skin is warm and dry.     Capillary Refill: Capillary refill takes less than 2 seconds.  Neurological:     Mental Status: She is alert and oriented to person, place, and time.     GCS: GCS eye subscore is 4. GCS verbal  subscore  is 5. GCS motor subscore is 6.     Sensory: No sensory deficit.  Psychiatric:        Mood and Affect: Mood normal.        Speech: Speech normal.        Behavior: Behavior normal.        Thought Content: Thought content normal.        Judgment: Judgment normal.       No results found for this or any previous visit (from the past 48 hour(s)).  Assessment & Plan 1. Moderate persistent asthma with exacerbation Exacerbation resolved, asthma controlled with breo maintenance medication- discussed asthma in detail and need for maintainance medication, avoidance of triggers,  Emergency plan, appropriate use of rescue inhaler. Due to financial issues- will be switching from breo to dulera as connected care is working on getting patient onto patient assistance program. Patient is instructed to call us 1-2 weeks before her breo runs out to inform us if she has not received a phone call from the patient assistance program or her dulera. When she recieves new inhaler she will then stop breo and start dulera.  2. Hot flashes Discussed behavioral treatments, try low dose vitamin E 100 units every other day. Consider pheo due to accompanying symptoms- palpitations,  - TSH; Future - Metanephrines, Urine, 24 hour; Future - VMA, urine, 24 hour; Future  Face-to-face time with patient was more than 25 minutes, >50% time spent counseling and coordination of care

## 2018-09-03 DIAGNOSIS — E119 Type 2 diabetes mellitus without complications: Secondary | ICD-10-CM | POA: Diagnosis not present

## 2018-09-03 DIAGNOSIS — I208 Other forms of angina pectoris: Secondary | ICD-10-CM | POA: Diagnosis not present

## 2018-09-03 DIAGNOSIS — R9431 Abnormal electrocardiogram [ECG] [EKG]: Secondary | ICD-10-CM | POA: Diagnosis not present

## 2018-09-03 DIAGNOSIS — R0602 Shortness of breath: Secondary | ICD-10-CM | POA: Diagnosis not present

## 2018-09-03 DIAGNOSIS — I10 Essential (primary) hypertension: Secondary | ICD-10-CM | POA: Diagnosis not present

## 2018-09-03 DIAGNOSIS — K219 Gastro-esophageal reflux disease without esophagitis: Secondary | ICD-10-CM | POA: Diagnosis not present

## 2018-09-03 DIAGNOSIS — I209 Angina pectoris, unspecified: Secondary | ICD-10-CM | POA: Diagnosis not present

## 2018-09-03 DIAGNOSIS — E785 Hyperlipidemia, unspecified: Secondary | ICD-10-CM | POA: Diagnosis not present

## 2018-09-04 ENCOUNTER — Ambulatory Visit (INDEPENDENT_AMBULATORY_CARE_PROVIDER_SITE_OTHER): Payer: Medicare HMO | Admitting: Nurse Practitioner

## 2018-09-04 ENCOUNTER — Encounter: Payer: Self-pay | Admitting: Nurse Practitioner

## 2018-09-04 VITALS — BP 126/60 | HR 85 | Temp 97.9°F | Resp 16 | Ht 66.0 in | Wt 178.2 lb

## 2018-09-04 DIAGNOSIS — R51 Headache: Secondary | ICD-10-CM

## 2018-09-04 DIAGNOSIS — G8929 Other chronic pain: Secondary | ICD-10-CM

## 2018-09-04 MED ORDER — KETOROLAC TROMETHAMINE 60 MG/2ML IM SOLN
30.0000 mg | Freq: Once | INTRAMUSCULAR | Status: AC
  Administered 2018-09-04: 30 mg via INTRAMUSCULAR

## 2018-09-04 MED ORDER — PROMETHAZINE HCL 12.5 MG PO TABS: 12.5000 mg | ORAL_TABLET | Freq: Three times a day (TID) | ORAL | 0 refills | Status: DC | PRN

## 2018-09-04 MED ORDER — IBUPROFEN 800 MG PO TABS: 800.0000 mg | ORAL_TABLET | Freq: Three times a day (TID) | ORAL | 0 refills | Status: DC | PRN

## 2018-09-04 NOTE — Progress Notes (Signed)
Name: Lori Potts   MRN: [id number]    DOB: October 20, 1945   Date:09/04/2018       Progress Note  Subjective  Chief Complaint  Chief Complaint  Patient presents with  . Headache    started yesterday. otc ibuprofen    HPI  Right sided migraine headache- feels typical of her migraine headaches. States she used to have them frequently but last one was in the summer. States yesterday woke up with right sided posterior headache shoots to front of head.  Has associated nausea, photosensitivity. Has tried ibuprofen and laying in dark room to sleep without any relief. States the only thing that typically helps is IM shot here in the office and she goes home and sleeps.   Notes increase stress.  No lightheadedness, dizziness, blurry vision, neck stiffness.   Patient Active Problem List   Diagnosis Date Noted  . Microalbuminuria 01/22/2018  . Anemia 09/22/2017  . S/P knee replacement 07/28/2017  . Allergic rhinitis 05/26/2017  . Vitamin D deficiency 05/19/2017  . Chronic nonintractable headache 05/18/2017  . Special screening for malignant neoplasms, colon   . Gastroesophageal reflux disease   . Gastritis without bleeding   . 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 Insomnia 11/29/2016  . 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 Vasomotor flushing 04/19/2016  . Abdominal wall pain in right flank 03/29/2016  . Diverticulosis 02/24/2016  . Hypercholesterolemia 06/16/2015  . FHx: migraine headaches 04/13/2015  . Chronic pain 04/13/2015  . Neck pain 02/26/2015  . Status post lumbar surgery 02/26/2015  . Abnormal ECG 01/27/2015  . Angina pectoris ([place]) 01/27/2015  . 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 BP (high blood pressure) 01/27/2015    Past Medical History:  Diagnosis Date  . Arthritis   . 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 Cataract    right eye but immature  . Essential  hypertension, benign    takes Lisinopril-HCTZ daily  . GERD (gastroesophageal reflux disease)   . Headache    sinus  . History of colon polyps    benign  . History of migraine   . History of shingles   . Hyperlipidemia    takes Lipitor daily  . Low back pain   . Nocturia   . S/P insertion of spinal cord stimulator   . Seasonal allergies    takes Singulair daily as needed  . Type II or unspecified type diabetes mellitus without mention of complication, not stated as uncontrolled   . Vitamin D deficiency    takes Vit D weekly  . Weakness    numbness and tingling left arm  . Wears dentures    full upper and lower    Past Surgical History:  Procedure Laterality Date  . ABDOMINAL HYSTERECTOMY    . ANTERIOR CERVICAL DECOMP/DISCECTOMY FUSION N/A 02/26/2015   Procedure: ANTERIOR CERVICAL DISCECTOMY FUSION C4-5 (1 LEVEL);  Surgeon: Melina Schools, MD;  Location: [place];  Service: Orthopedics;  Laterality: N/A;  . ANTERIOR LAT LUMBAR FUSION N/A 03/21/2013   Procedure: ANTERIOR LATERAL LUMBAR FUSION 1 LEVEL/ XLIF L3-L4 ;  Surgeon: Melina Schools, MD;  Location: [place];  Service: Orthopedics;  Laterality: N/A;  . APPENDECTOMY    . AUGMENTATION MAMMAPLASTY Bilateral 1978  . BACK SURGERY    . BACK SURGERY     Lumbar fusion x 2  . BREAST EXCISIONAL BIOPSY Right 1970  . BREAST SURGERY  1990   Augementation  .  CARDIAC CATHETERIZATION  5/12   ef 55%  . CARDIAC CATHETERIZATION  10/2010   [facility]; EF 55%  . CARDIAC CATHETERIZATION Left 02/16/2016   Procedure: Left Heart Cath and Coronary Angiography;  Surgeon: Yolonda Kida, MD;  Location: [place] [facility];  Service: Cardiovascular;  Laterality: Left;  . COLONOSCOPY    . COLONOSCOPY WITH PROPOFOL N/A 04/10/2017   Procedure: COLONOSCOPY WITH PROPOFOL;  Surgeon: Lucilla Lame, MD;  Location: [place];  Service: Gastroenterology;  Laterality: N/A;  Diabetic - insulin  . ESOPHAGOGASTRODUODENOSCOPY    . [place]   growth  removed from left kidney   . pain stimulator    . POSTERIOR CERVICAL FUSION/FORAMINOTOMY Right 03/21/2013   Procedure: POSTERIOR L2-3 RIGHT FORAMINOTOMY;  Surgeon: Melina Schools, MD;  Location: [place];  Service: Orthopedics;  Laterality: Right;  . REVISION OF SCAR TISSUE RECTUS MUSCLE    . SMALL BOWEL REPAIR    . SPINAL CORD STIMULATOR BATTERY EXCHANGE N/A 10/17/2012   Procedure: SPINAL CORD STIMULATOR BATTERY REMOVAL;  Surgeon: Melina Schools, MD;  Location: [place];  Service: Orthopedics;  Laterality: N/A;  . SPINAL CORD STIMULATOR BATTERY EXCHANGE N/A 07/22/2015   Procedure: REIMPLANTATION OF SPINAL CORD STIMULATOR BATTERY ;  Surgeon: Melina Schools, MD;  Location: [place];  Service: Orthopedics;  Laterality: N/A;  . TOTAL KNEE ARTHROPLASTY Left   . TOTAL KNEE ARTHROPLASTY Right 07/28/2017   Procedure: RIGHT TOTAL KNEE ARTHROPLASTY;  Surgeon: Sydnee Cabal, MD;  [facility];  Service: Orthopedics;  Laterality: Right;    Social History   Tobacco Use  . Smoking status: Former Smoker    Types: Cigarettes    Last attempt to quit: 2006    Years since quitting: 14.0  . Smokeless tobacco: Never Used  . Tobacco comment: quit smoking 50yr ago  Substance Use Topics  . Alcohol use: No     Current Outpatient Medications:  .  albuterol (PROVENTIL HFA;VENTOLIN HFA) 108 (90 Base) MCG/ACT inhaler, Inhale 1 puff into the lungs every 6 (six) hours as needed for wheezing or shortness of breath., Disp: , Rfl:  .  aspirin EC 81 MG tablet, Take 81 mg by mouth daily., Disp: , Rfl:  .  atorvastatin (LIPITOR) 80 MG tablet, TAKE 1 TABLET BY MOUTH EVERY DAY, Disp: 7 tablet, Rfl: 0 .  Blood Glucose Calibration (TRUE METRIX LEVEL 1) Low SOLN, Use to check fingerstick blood sugars once a day; LON 99 months; E11.9, Disp: 1 each, Rfl: 1 .  Blood Glucose Monitoring Suppl (TRUE METRIX METER) w/Device KIT, Use to check fingerstick blood sugars once a day; LON 99 months; E11.9, Disp: 1 kit, Rfl: 0 .  fluticasone  furoate-vilanterol (BREO ELLIPTA) 100-25 MCG/INH AEPB, Inhale 1 puff into the lungs daily., Disp: 1 each, Rfl: 1 .  glucose blood (ACCU-CHEK AVIVA) test strip, Check fingerstick blood sugars once a day; LON 99 months, Dx E11.9, Disp: 100 each, Rfl: 3 .  Insulin Pen Needle (BD ULTRA-FINE PEN NEEDLES) 29G X 12.7MM MISC, For use with injectable GLP-1 inhibitor, Victoza, once a day, Disp: 100 each, Rfl: 3 .  ipratropium-albuterol (DUONEB) 0.5-2.5 (3) MG/3ML SOLN, Can take 333mevery 20 minutes for 2 doses as needed for wheezing, Disp: 50 mL, Rfl: 0 .  Lancets (FREESTYLE) lancets, Use to check fingerstick blood sugars once a day; LON 99 months; E11.9, Disp: 100 each, Rfl: 3 .  liraglutide (VICTOZA) 18 MG/3ML SOPN, Inject 0.1 mLs (0.6 mg total) into the skin daily. (Patient taking  differently: Inject 0.6 mg into the skin at bedtime. ), Disp: 1 pen, Rfl: 4 .  losartan (COZAAR) 25 MG tablet, TAKE 1 TABLET BY MOUTH EVERY DAY, Disp: 30 tablet, Rfl: 5 .  mometasone (NASONEX) 50 MCG/ACT nasal spray, Place 2 sprays into the nose daily. (Patient taking differently: Place 2 sprays into the nose daily as needed (for allergies). ), Disp: 17 g, Rfl: 0 .  montelukast (SINGULAIR) 10 MG tablet, Take 10 mg by mouth at bedtime., Disp: , Rfl:  .  oxyCODONE-acetaminophen (PERCOCET) 10-325 MG tablet, Take 1 tablet by mouth 5 (five) times daily. , Disp: , Rfl: 0  Allergies  Allergen Reactions  . Metformin And Related Diarrhea  . Adhesive [Tape] Rash and Other (See Comments)    Regular tape is ok, allergy is to paper tape    ROS   No other specific complaints in a complete review of systems (except as listed in HPI above).  Objective  Vitals:   09/04/18 1429  BP: 126/60  Pulse: 85  Resp: 16  Temp: 97.9 F (36.6 C)  TempSrc: Oral  SpO2: 98%  Weight: 178 lb 3.2 oz (80.8 kg)  Height: _0  (1.676 m)    Body mass index is 28.76 kg/m.  Nursing Note and Vital Signs reviewed.  Physical Exam Constitutional:       Appearance: She is well-developed.  Eyes:     Conjunctiva/sclera: Conjunctivae normal.  Cardiovascular:     Rate and Rhythm: Normal rate.  Pulmonary:     Effort: Pulmonary effort is normal.  Neurological:     Mental Status: She is alert and oriented to person, place, and time.     GCS: GCS eye subscore is 4. GCS verbal subscore is 5. GCS motor subscore is 6.     Cranial Nerves: No dysarthria or facial asymmetry.     Sensory: No sensory deficit.  Psychiatric:        Mood and Affect: Mood normal.        Behavior: Behavior normal.       No results found for this or any previous visit (from the past 48 hour(s)).  Assessment & Plan  1. Chronic nonintractable headache, unspecified headache type Rest, hydration.  - promethazine (PHENERGAN) 12.5 MG tablet; Take 1 tablet (12.5 mg total) by mouth every 8 (eight) hours as needed for nausea or vomiting.  Dispense: 6 tablet; Refill: 0 - ketorolac (TORADOL) injection 30 mg - ibuprofen (ADVIL,MOTRIN) 800 MG tablet; Take 1 tablet (800 mg total) by mouth every 8 (eight) hours as needed.  Dispense: 30 tablet; Refill: 0

## 2018-09-04 NOTE — Patient Instructions (Addendum)
Rest, drink plenty of water, take phenergan 12.5mg  when you get home. Do not take ibuprofen again for another 8 hours due to toradol shot.

## 2018-09-10 ENCOUNTER — Encounter
Admission: RE | Disposition: A | Discharge status: Home / Self Care | Payer: Self-pay | Source: Home / Self Care | Attending: Internal Medicine

## 2018-09-10 ENCOUNTER — Other Ambulatory Visit: Payer: Self-pay

## 2018-09-10 ENCOUNTER — Ambulatory Visit
Admission: RE | Admit: 2018-09-10 | Discharge: 2018-09-10 | Disposition: A | Discharge status: Home / Self Care | Payer: Medicare HMO | Attending: Internal Medicine | Admitting: Internal Medicine

## 2018-09-10 DIAGNOSIS — R0600 Dyspnea, unspecified: Secondary | ICD-10-CM | POA: Diagnosis not present

## 2018-09-10 DIAGNOSIS — Z90710 Acquired absence of both cervix and uterus: Secondary | ICD-10-CM | POA: Insufficient documentation

## 2018-09-10 DIAGNOSIS — I10 Essential (primary) hypertension: Secondary | ICD-10-CM | POA: Diagnosis not present

## 2018-09-10 DIAGNOSIS — Z87891 Personal history of nicotine dependence: Secondary | ICD-10-CM | POA: Diagnosis not present

## 2018-09-10 DIAGNOSIS — E119 Type 2 diabetes mellitus without complications: Secondary | ICD-10-CM | POA: Insufficient documentation

## 2018-09-10 DIAGNOSIS — Z6831 Body mass index (BMI) 31.0-31.9, adult: Secondary | ICD-10-CM | POA: Insufficient documentation

## 2018-09-10 DIAGNOSIS — R9431 Abnormal electrocardiogram [ECG] [EKG]: Secondary | ICD-10-CM | POA: Insufficient documentation

## 2018-09-10 DIAGNOSIS — E669 Obesity, unspecified: Secondary | ICD-10-CM | POA: Insufficient documentation

## 2018-09-10 DIAGNOSIS — Z955 Presence of coronary angioplasty implant and graft: Secondary | ICD-10-CM | POA: Diagnosis not present

## 2018-09-10 DIAGNOSIS — K219 Gastro-esophageal reflux disease without esophagitis: Secondary | ICD-10-CM | POA: Diagnosis not present

## 2018-09-10 DIAGNOSIS — Z79899 Other long term (current) drug therapy: Secondary | ICD-10-CM | POA: Insufficient documentation

## 2018-09-10 DIAGNOSIS — E785 Hyperlipidemia, unspecified: Secondary | ICD-10-CM | POA: Diagnosis not present

## 2018-09-10 DIAGNOSIS — I200 Unstable angina: Secondary | ICD-10-CM | POA: Diagnosis not present

## 2018-09-10 DIAGNOSIS — R079 Chest pain, unspecified: Secondary | ICD-10-CM

## 2018-09-10 DIAGNOSIS — I209 Angina pectoris, unspecified: Secondary | ICD-10-CM | POA: Diagnosis not present

## 2018-09-10 HISTORY — PX: LEFT HEART CATH AND CORONARY ANGIOGRAPHY: CATH118249

## 2018-09-10 LAB — GLUCOSE, CAPILLARY
Glucose-Capillary: 93 mg/dL (ref 70–99)
Glucose-Capillary: 69 mg/dL — ABNORMAL LOW (ref 70–99)
Glucose-Capillary: 110 mg/dL — ABNORMAL HIGH (ref 70–99)

## 2018-09-10 SURGERY — LEFT HEART CATH AND CORONARY ANGIOGRAPHY
Anesthesia: Moderate Sedation | Laterality: Left

## 2018-09-10 MED ORDER — HEPARIN SODIUM (PORCINE) 1000 UNIT/ML IJ SOLN
INTRAMUSCULAR | Status: AC
  Filled 2018-09-10: qty 1

## 2018-09-10 MED ORDER — SODIUM CHLORIDE 0.9% FLUSH: 3.0000 mL | Freq: Two times a day (BID) | INTRAVENOUS | Status: DC

## 2018-09-10 MED ORDER — SODIUM CHLORIDE 0.9 % WEIGHT BASED INFUSION: 3.0000 mL/kg/h | INTRAVENOUS | Status: AC

## 2018-09-10 MED ORDER — SODIUM CHLORIDE 0.9% FLUSH: 3.0000 mL | INTRAVENOUS | Status: DC | PRN

## 2018-09-10 MED ORDER — ACETAMINOPHEN 325 MG PO TABS: 650.0000 mg | ORAL_TABLET | ORAL | Status: DC | PRN

## 2018-09-10 MED ORDER — IOPAMIDOL (ISOVUE-300) INJECTION 61%
INTRAVENOUS | Status: DC | PRN
  Administered 2018-09-10: 80 mL via INTRA_ARTERIAL

## 2018-09-10 MED ORDER — MIDAZOLAM HCL 2 MG/2ML IJ SOLN
INTRAMUSCULAR | Status: AC
  Filled 2018-09-10: qty 2

## 2018-09-10 MED ORDER — FENTANYL CITRATE (PF) 100 MCG/2ML IJ SOLN
INTRAMUSCULAR | Status: DC | PRN
  Administered 2018-09-10: 25 ug via INTRAVENOUS

## 2018-09-10 MED ORDER — SODIUM CHLORIDE 0.9 % WEIGHT BASED INFUSION: 1.0000 mL/kg/h | INTRAVENOUS | Status: DC

## 2018-09-10 MED ORDER — ONDANSETRON HCL 4 MG/2ML IJ SOLN: 4.0000 mg | Freq: Four times a day (QID) | INTRAMUSCULAR | Status: DC | PRN

## 2018-09-10 MED ORDER — SODIUM CHLORIDE 0.9 % IV SOLN: 250.0000 mL | INTRAVENOUS | Status: DC | PRN

## 2018-09-10 MED ORDER — ASPIRIN 81 MG PO CHEW: 81.0000 mg | CHEWABLE_TABLET | ORAL | Status: DC

## 2018-09-10 MED ORDER — HEPARIN (PORCINE) IN NACL 1000-0.9 UT/500ML-% IV SOLN
INTRAVENOUS | Status: DC | PRN
  Administered 2018-09-10 (×2): 500 mL

## 2018-09-10 MED ORDER — HEPARIN (PORCINE) IN NACL 1000-0.9 UT/500ML-% IV SOLN
INTRAVENOUS | Status: AC
  Filled 2018-09-10: qty 1000

## 2018-09-10 MED ORDER — MIDAZOLAM HCL 2 MG/2ML IJ SOLN
INTRAMUSCULAR | Status: DC | PRN
  Administered 2018-09-10: 1 mg via INTRAVENOUS

## 2018-09-10 MED ORDER — VERAPAMIL HCL 2.5 MG/ML IV SOLN
INTRAVENOUS | Status: AC
  Filled 2018-09-10: qty 2

## 2018-09-10 MED ORDER — FENTANYL CITRATE (PF) 100 MCG/2ML IJ SOLN
INTRAMUSCULAR | Status: AC
  Filled 2018-09-10: qty 2

## 2018-09-10 SURGICAL SUPPLY — 12 items
CATH INFINITI 5FR ANG PIGTAIL (CATHETERS) ×2 IMPLANT
CATH INFINITI 5FR JL4 (CATHETERS) ×2 IMPLANT
CATH INFINITI JR4 5F (CATHETERS) ×2 IMPLANT
DEVICE CLOSURE MYNXGRIP 5F (Vascular Products) ×2 IMPLANT
GLIDESHEATH SLEND A-KIT 6F 22G (SHEATH) ×2 IMPLANT
KIT MANI 3VAL PERCEP (MISCELLANEOUS) ×3 IMPLANT
NDL PERC 18GX7CM (NEEDLE) IMPLANT
NEEDLE PERC 18GX7CM (NEEDLE) ×3 IMPLANT
PACK CARDIAC CATH (CUSTOM PROCEDURE TRAY) ×3 IMPLANT
SHEATH AVANTI 5FR X 11CM (SHEATH) ×2 IMPLANT
WIRE GUIDERIGHT .035X150 (WIRE) ×2 IMPLANT
WIRE ROSEN-J .035X260CM (WIRE) ×2 IMPLANT

## 2018-09-14 ENCOUNTER — Other Ambulatory Visit: Payer: Self-pay | Admitting: Pharmacy Technician

## 2018-09-14 NOTE — Patient Outreach (Signed)
[facility]) Care Management  09/14/2018  [name] 1946-08-17 [id number]    Follow up call placed to [employer] regarding patient assistance application(s) for Proventil HFA , Mikle Bosworth confirms that application was received and attestation was mailed out to patient on 1/20.    Successful call placed to patient regarding patient assistance update for Proventil HFA, HIPAA identifiers verified. Requested Lori Potts contact me when attestation form has been received so that I can assist her with filling it out. She stated she would do so.  Will follow up with patient in 3-5 business days if I have not received a call from her.  [name] Effie Shy CPhT Certified Pharmacy Technician [facility] Care Management Direct Dial:[phone]

## 2018-09-20 ENCOUNTER — Other Ambulatory Visit: Payer: Self-pay | Admitting: Pharmacy Technician

## 2018-09-20 NOTE — Patient Outreach (Signed)
[facility]) Care Management  09/20/2018  TENA LITTS 31-Jan-1946 [id number]   Return call to Mrs. Ulatowski who stated she had received the attestation form from [employer] for her Proventil HFA. Assisted her with filling form out.  Will follow up with company in 7-10 business days to confirm form has been received.  [name] Effie Shy CPhT Certified Pharmacy Technician [facility] Care Management Direct Dial:[phone]

## 2018-09-29 ENCOUNTER — Ambulatory Visit
Admission: EM | Admit: 2018-09-29 | Discharge: 2018-09-29 | Disposition: A | Discharge status: Home / Self Care | Payer: Medicare HMO | Attending: Family Medicine | Admitting: Family Medicine

## 2018-09-29 ENCOUNTER — Encounter: Payer: Self-pay | Admitting: Gynecology

## 2018-09-29 ENCOUNTER — Ambulatory Visit (INDEPENDENT_AMBULATORY_CARE_PROVIDER_SITE_OTHER): Payer: Medicare HMO

## 2018-09-29 ENCOUNTER — Other Ambulatory Visit: Payer: Self-pay

## 2018-09-29 DIAGNOSIS — M545 Low back pain: Secondary | ICD-10-CM | POA: Diagnosis not present

## 2018-09-29 DIAGNOSIS — S39012A Strain of muscle, fascia and tendon of lower back, initial encounter: Secondary | ICD-10-CM

## 2018-09-29 MED ORDER — CYCLOBENZAPRINE HCL 5 MG PO TABS: 5.0000 mg | ORAL_TABLET | Freq: Every day | ORAL | 0 refills | Status: DC
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
## 2018-10-01 ENCOUNTER — Other Ambulatory Visit: Payer: Self-pay | Admitting: Pharmacy Technician

## 2018-10-01 ENCOUNTER — Other Ambulatory Visit: Payer: Self-pay | Admitting: Family Medicine

## 2018-10-01 NOTE — Patient Outreach (Signed)
[facility]) Care Management  10/01/2018  ALETHEA DIANO 1945/09/28 [id number]    Follow up call placed to [employer] regarding patient assistance application(s) for Proventil HFA , Joni Reining confirms attestation form has been received . Patient has been approved as of 2/7 until 08/22/19.   Successful call placed to patient regarding patient assistance update for Proventil HFA, HIPAA identifiers verified. Provided Mrs. Signore with [facility] phone number to contact to check shipment details for her Proventil HFA.  Will follow up with patient in 7-10 business days to confirm medication has been received from company.  [name] Effie Shy CPhT Certified Pharmacy Technician [facility] Care Management Direct Dial:[phone]

## 2018-10-01 NOTE — Telephone Encounter (Signed)
From Sept 24, 2019: "her cholesterol is better (LDL down from 124 to 98), so she's headed in the right direction but not quite to target; let's increase the atorvastatin from 40 mg to 80 mg at bedtime, new Rx sent; recheck lipids in 6 weeks, please ORDER"  From Aug 13, 2018: "Please remind patient that we would like to see what the higher dose of cholesterol medicine is doing See orders from 05/16/18 I'll send in one week of medicine and we'll check to verify that's going to be the right dose"  -----------------------------------------------------  Patient is overdue for lipid panel She's not supposed to be on 40 mg atorvastatin She is supposed to be taking 80 mg of atorvastatin Ask he what she's been taking, because she would have run out of the week of 80 mg pills back in December Please ask her to come in ASAP for lipids and document what she's been taking so I can adjust that dose

## 2018-10-02 NOTE — Telephone Encounter (Signed)
Pt states she has been taking 2 of her 40mg , a total of 80mg  daily. Stated she will come in tomorrow for labs.

## 2018-10-12 DIAGNOSIS — M961 Postlaminectomy syndrome, not elsewhere classified: Secondary | ICD-10-CM | POA: Diagnosis not present

## 2018-10-12 DIAGNOSIS — Z139 Encounter for screening, unspecified: Secondary | ICD-10-CM | POA: Diagnosis not present

## 2018-10-12 DIAGNOSIS — Z79891 Long term (current) use of opiate analgesic: Secondary | ICD-10-CM | POA: Diagnosis not present

## 2018-10-17 ENCOUNTER — Other Ambulatory Visit: Payer: Self-pay | Admitting: Chiropractic Medicine

## 2018-10-21 ENCOUNTER — Other Ambulatory Visit: Payer: Self-pay | Admitting: Nurse Practitioner

## 2018-10-21 DIAGNOSIS — J4541 Moderate persistent asthma with (acute) exacerbation: Secondary | ICD-10-CM

## 2018-10-22 ENCOUNTER — Other Ambulatory Visit: Payer: Self-pay | Admitting: Chiropractic Medicine

## 2018-10-22 DIAGNOSIS — Z981 Arthrodesis status: Secondary | ICD-10-CM

## 2018-10-30 ENCOUNTER — Encounter: Payer: Self-pay | Admitting: Nurse Practitioner

## 2018-10-30 ENCOUNTER — Ambulatory Visit (INDEPENDENT_AMBULATORY_CARE_PROVIDER_SITE_OTHER): Payer: Medicare HMO

## 2018-10-30 ENCOUNTER — Ambulatory Visit (INDEPENDENT_AMBULATORY_CARE_PROVIDER_SITE_OTHER): Payer: Medicare HMO | Admitting: Nurse Practitioner

## 2018-10-30 VITALS — BP 140/80 | HR 78 | Temp 97.7°F | Resp 16 | Ht 66.0 in | Wt 179.1 lb

## 2018-10-30 DIAGNOSIS — M25552 Pain in left hip: Secondary | ICD-10-CM | POA: Diagnosis not present

## 2018-10-30 DIAGNOSIS — Z0000 Encounter for general adult medical examination without abnormal findings: Secondary | ICD-10-CM

## 2018-10-30 DIAGNOSIS — Z794 Long term (current) use of insulin: Secondary | ICD-10-CM

## 2018-10-30 DIAGNOSIS — R030 Elevated blood-pressure reading, without diagnosis of hypertension: Secondary | ICD-10-CM | POA: Insufficient documentation

## 2018-10-30 DIAGNOSIS — E0829 Diabetes mellitus due to underlying condition with other diabetic kidney complication: Secondary | ICD-10-CM | POA: Diagnosis not present

## 2018-10-30 DIAGNOSIS — R51 Headache: Secondary | ICD-10-CM

## 2018-10-30 DIAGNOSIS — R809 Proteinuria, unspecified: Secondary | ICD-10-CM

## 2018-10-30 DIAGNOSIS — J40 Bronchitis, not specified as acute or chronic: Secondary | ICD-10-CM

## 2018-10-30 DIAGNOSIS — G8929 Other chronic pain: Secondary | ICD-10-CM

## 2018-10-30 DIAGNOSIS — J410 Simple chronic bronchitis: Secondary | ICD-10-CM | POA: Insufficient documentation

## 2018-10-30 MED ORDER — PROMETHAZINE HCL 25 MG/ML IJ SOLN: 12.5000 mg | Freq: Once | INTRAMUSCULAR | Status: DC

## 2018-10-30 MED ORDER — PROMETHAZINE HCL 12.5 MG PO TABS: 12.5000 mg | ORAL_TABLET | Freq: Three times a day (TID) | ORAL | 0 refills | Status: DC | PRN

## 2018-10-30 MED ORDER — PREDNISONE 10 MG (21) PO TBPK: ORAL_TABLET | ORAL | 0 refills | Status: DC

## 2018-10-30 MED ORDER — PROMETHAZINE HCL 25 MG/ML IJ SOLN
6.2500 mg | Freq: Once | INTRAMUSCULAR | Status: AC
  Administered 2018-10-30: 6.25 mg via INTRAMUSCULAR

## 2018-10-30 MED ORDER — KETOROLAC TROMETHAMINE 30 MG/ML IJ SOLN: 30.0000 mg | Freq: Once | INTRAMUSCULAR | Status: DC

## 2018-10-30 MED ORDER — KETOROLAC TROMETHAMINE 60 MG/2ML IM SOLN
30.0000 mg | Freq: Once | INTRAMUSCULAR | Status: AC
  Administered 2018-10-30: 30 mg via INTRAMUSCULAR
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
## 2018-10-30 NOTE — Progress Notes (Addendum)
Subjective:   Lori Potts is a 73 y.o. female who presents for an Initial Medicare Annual Wellness Visit.  Review of Systems      Cardiac Risk Factors include: advanced age (>68mn, >>85women);diabetes mellitus;dyslipidemia;hypertension     Objective:    Today's Vitals   10/30/18 0934  BP: 140/80  Pulse: 78  Resp: 16  Temp: 97.7 F (36.5 C)  TempSrc: Oral  SpO2: 96%  Weight: 179 lb 1.6 oz (81.2 kg)  Height: '5\' 6"'$  (1.676 m)  PainSc: 10-Worst pain ever   Body mass index is 28.91 kg/m.  Advanced Directives 10/30/2018 09/10/2018 08/04/2018 11/30/2017 07/28/2017 07/24/2017 05/26/2017  Does Patient Have a Medical Advance Directive? Yes Yes Yes Yes Yes No Yes  Type of Advance Directive Living will;Healthcare Power of [place]- -  Does patient want to make changes to medical advance directive? - No - Patient declined - - No - Patient declined - -  Copy of HLuna Pierin Chart? No - copy requested No - copy requested - - - - -  Would patient like information on creating a medical advance directive? - - - - - No - Patient declined -    Current Medications (verified) Outpatient Encounter Medications as of 10/30/2018  Medication Sig  . albuterol (PROVENTIL HFA;VENTOLIN HFA) 108 (90 Base) MCG/ACT inhaler Inhale 1 puff into the lungs every 6 (six) hours as needed for wheezing or shortness of breath.  .Marland Kitchenaspirin EC 81 MG tablet Take 81 mg by mouth daily.  .Marland Kitchenatorvastatin (LIPITOR) 80 MG tablet TAKE 1 TABLET BY MOUTH EVERY DAY  . Blood Glucose Monitoring Suppl (TRUE METRIX METER) w/Device KIT Use to check fingerstick blood sugars once a day; LON 99 months; E11.9  . BREO ELLIPTA 100-25 MCG/INH AEPB TAKE 1 PUFF BY MOUTH EVERY DAY  . ibuprofen (ADVIL,MOTRIN) 800 MG tablet Take 1 tablet (800 mg total) by mouth every 8 (eight) hours as  needed.  . Insulin Pen Needle (BD ULTRA-FINE PEN NEEDLES) 29G X 12.7MM MISC For use with injectable GLP-1 inhibitor, Victoza, once a day  . ipratropium-albuterol (DUONEB) 0.5-2.5 (3) MG/3ML SOLN Can take 372mevery 20 minutes for 2 doses as needed for wheezing  . Lancets (FREESTYLE) lancets Use to check fingerstick blood sugars once a day; LON 99 months; E11.9  . liraglutide (VICTOZA) 18 MG/3ML SOPN Inject 0.1 mLs (0.6 mg total) into the skin daily. (Patient taking differently: Inject 0.6 mg into the skin at bedtime. Pt taking 6 units at bedtime)  . losartan (COZAAR) 25 MG tablet TAKE 1 TABLET BY MOUTH EVERY DAY  . mometasone (NASONEX) 50 MCG/ACT nasal spray Place 2 sprays into the nose daily. (Patient taking differently: Place 2 sprays into the nose daily as needed (for allergies). )  . montelukast (SINGULAIR) 10 MG tablet Take 10 mg by mouth at bedtime.  . Marland KitchenxyCODONE-acetaminophen (PERCOCET) 10-325 MG tablet Take 1 tablet by mouth 5 (five) times daily.   . cyclobenzaprine (FLEXERIL) 5 MG tablet Take 1 tablet (5 mg total) by mouth at bedtime. (Patient not taking: Reported on 10/30/2018)  . promethazine (PHENERGAN) 12.5 MG tablet Take 1 tablet (12.5 mg total) by mouth every 8 (eight) hours as needed for nausea or vomiting. (Patient not taking: Reported on 10/30/2018)  . [DISCONTINUED] Blood Glucose Calibration (TRUE METRIX LEVEL 1) Low SOLN USE AS DIRECTED  . [DISCONTINUED] glucose blood (  ACCU-CHEK AVIVA) test strip Check fingerstick blood sugars once a day; LON 99 months, Dx E11.9   No facility-administered encounter medications on file as of 10/30/2018.     Allergies (verified) Metformin and related and Adhesive [tape]   History: Past Medical History:  Diagnosis Date  . Arthritis   . Asthma   . 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 Cataract    right eye but immature  . Essential hypertension, benign    takes Lisinopril-HCTZ daily  . GERD (gastroesophageal reflux disease)   . Headache     sinus  . History of colon polyps    benign  . History of migraine   . History of shingles   . Hyperlipidemia    takes Lipitor daily  . Low back pain   . Nocturia   . S/P insertion of spinal cord stimulator   . Seasonal allergies    takes Singulair daily as needed  . Type II or unspecified type diabetes mellitus without mention of complication, not stated as uncontrolled   . Vitamin D deficiency    takes Vit D weekly  . Weakness    numbness and tingling left arm  . Wears dentures    full upper and lower   Past Surgical History:  Procedure Laterality Date  . ABDOMINAL HYSTERECTOMY    . ANTERIOR CERVICAL DECOMP/DISCECTOMY FUSION N/A 02/26/2015   Procedure: ANTERIOR CERVICAL DISCECTOMY FUSION C4-5 (1 LEVEL);  Surgeon: Melina Schools, MD;  Location: [facility];  Service: Orthopedics;  Laterality: N/A;  . ANTERIOR LAT LUMBAR FUSION N/A 03/21/2013   Procedure: ANTERIOR LATERAL LUMBAR FUSION 1 LEVEL/ XLIF L3-L4 ;  Surgeon: Melina Schools, MD;  Location: [facility];  Service: Orthopedics;  Laterality: N/A;  . APPENDECTOMY    . AUGMENTATION MAMMAPLASTY Bilateral 1978  . BACK SURGERY    . BACK SURGERY     Lumbar fusion x 2  . BREAST EXCISIONAL BIOPSY Right 1970  . BREAST SURGERY  1990   Augementation  . CARDIAC CATHETERIZATION  5/12   ef 55%  . CARDIAC CATHETERIZATION  10/2010   [facility]; EF 55%  . CARDIAC CATHETERIZATION Left 02/16/2016   Procedure: Left Heart Cath and Coronary Angiography;  Surgeon: Yolonda Kida, MD;  Location: [facility];  Service: Cardiovascular;  Laterality: Left;  . COLONOSCOPY    . COLONOSCOPY WITH PROPOFOL N/A 04/10/2017   Procedure: COLONOSCOPY WITH PROPOFOL;  Surgeon: Lucilla Lame, MD;  Location: [facility];  Service: Gastroenterology;  Laterality: N/A;  Diabetic - insulin  . ESOPHAGOGASTRODUODENOSCOPY    . [place]   growth removed from left kidney   . LEFT HEART CATH AND CORONARY ANGIOGRAPHY Left 09/10/2018   Procedure: LEFT HEART CATH  AND CORONARY ANGIOGRAPHY;  Surgeon: Yolonda Kida, MD;  Location: [facility];  Service: Cardiovascular;  Laterality: Left;  . pain stimulator    . POSTERIOR CERVICAL FUSION/FORAMINOTOMY Right 03/21/2013   Procedure: POSTERIOR L2-3 RIGHT FORAMINOTOMY;  Surgeon: Melina Schools, MD;  Location: [place];  Service: Orthopedics;  Laterality: Right;  . REVISION OF SCAR TISSUE RECTUS MUSCLE    . SMALL BOWEL REPAIR    . SPINAL CORD STIMULATOR BATTERY EXCHANGE N/A 10/17/2012   Procedure: SPINAL CORD STIMULATOR BATTERY REMOVAL;  Surgeon: Melina Schools, MD;  Location: [place];  Service: Orthopedics;  Laterality: N/A;  . SPINAL CORD STIMULATOR BATTERY EXCHANGE N/A 07/22/2015   Procedure: REIMPLANTATION OF SPINAL CORD STIMULATOR BATTERY ;  Surgeon: Melina Schools, MD;  Location:  [facility];  Service: Orthopedics;  Laterality: N/A;  . TOTAL KNEE ARTHROPLASTY Left   . TOTAL KNEE ARTHROPLASTY Right 07/28/2017   Procedure: RIGHT TOTAL KNEE ARTHROPLASTY;  Surgeon: Sydnee Cabal, MD;  Location: [facility];  Service: Orthopedics;  Laterality: Right;   Family History  Problem Relation Age of Onset  . Heart attack Mother   . Sarcoidosis Sister    Social History   Socioeconomic History  . Marital status: Married    Spouse name: Gwyndolyn Saxon  . Number of children: 3  . Years of education: Not on file  . Highest education level: Associate degree: occupational, Hotel manager, or vocational program  Occupational History  . Not on file  Social Needs  . Financial resource strain: Not hard at all  . Food insecurity:    Worry: Never true    Inability: Never true  . Transportation needs:    Medical: No    Non-medical: No  Tobacco Use  . Smoking status: Former Smoker    Types: Cigarettes    Last attempt to quit: 2006    Years since quitting: 14.1  . Smokeless tobacco: Never Used  . Tobacco comment: quit smoking 43yr ago  Substance and Sexual Activity  . Alcohol use: No  . Drug use: No  . Sexual activity: Yes     Birth control/protection: Surgical  Lifestyle  . Physical activity:    Days per week: 0 days    Minutes per session: 0 min  . Stress: Not at all  Relationships  . Social connections:    Talks on phone: More than three times a week    Gets together: Twice a week    Attends religious service: More than 4 times per year    Active member of club or organization: Yes    Attends meetings of clubs or organizations: 1 to 4 times per year    Relationship status: Married  Other Topics Concern  . Not on file  Social History Narrative   ** Merged History Encounter **        Tobacco Counseling Counseling given: Not Answered Comment: quit smoking 184yrago   Clinical Intake:  Pre-visit preparation completed: Yes  Pain : 0-10 Pain Score: 10-Worst pain ever(pt states greater than 10) Pain Type: Chronic pain Pain Location: Back Pain Orientation: Lower Pain Descriptors / Indicators: Aching, Shooting Pain Onset: 1 to 4 weeks ago Pain Frequency: Constant     BMI - recorded: 28.91 Nutritional Status: BMI 25 -29 Overweight Nutritional Risks: Nausea/ vomitting/ diarrhea(side effect of pain meds) Diabetes: Yes CBG done?: No Did pt. bring in CBG monitor from home?: No   Nutrition Risk Assessment:  Has the patient had any N/V/D within the last 2 months?  Yes  due to pain medication Does the patient have any non-healing wounds?  No  Has the patient had any unintentional weight loss or weight gain?  No   Diabetes:  Is the patient diabetic?  Yes  If diabetic, was a CBG obtained today?  No  Did the patient bring in their glucometer from home?  No  How often do you monitor your CBG's? 3 times per day.   Financial Strains and Diabetes Management:  Are you having any financial strains with the device, your supplies or your medication? No .  Does the patient want to be seen by Chronic Care Management for management of their diabetes?  No  Would the patient like to be referred to a  Nutritionist or for Diabetic Management?  No   Diabetic Exams:  Diabetic Eye Exam: Completed 12/27/16. Overdue for diabetic eye exam. Pt has been advised about the importance in completing this exam. Pt missed last eye exam due to having the flu and aware to reschedule.   Diabetic Foot Exam: Completed 05/09/18.   How often do you need to have someone help you when you read instructions, pamphlets, or other written materials from your doctor or pharmacy?: 1 - Never What is the last grade level you completed in school?: associate degree  Interpreter Needed?: No  Information entered by :: Clemetine Marker LPN   Activities of Daily Living In your present state of health, do you have any difficulty performing the following activities: 10/30/2018 09/10/2018  Hearing? N N  Comment declines hearing aids -  Vision? N N  Comment wears reading glasses -  Difficulty concentrating or making decisions? Y N  Comment slowly -  Walking or climbing stairs? N N  Dressing or bathing? N N  Doing errands, shopping? N -  Preparing Food and eating ? N -  Using the Toilet? N -  In the past six months, have you accidently leaked urine? N -  Do you have problems with loss of bowel control? N -  Managing your Medications? N -  Managing your Finances? N -  Housekeeping or managing your Housekeeping? N -  Some recent data might be hidden     Immunizations and Health Maintenance Immunization History  Administered Date(s) Administered  . Influenza, High Dose Seasonal PF 06/10/2015, 06/20/2016, 05/09/2017, 05/09/2018  . Pneumococcal Conjugate-13 11/28/2013  . Pneumococcal Polysaccharide-23 03/16/2018   Health Maintenance Due  Topic Date Due  . TETANUS/TDAP  01/[birth year]/1965  . OPHTHALMOLOGY EXAM  12/27/2017  . MAMMOGRAM  05/04/2018    Patient Care Team: Arnetha Courser, MD as PCP - General (Family Medicine) Adaline Sill, CPhT as [place] Management (Pharmacy Technician)  Indicate any  recent [place] you may have received from other than [facility] providers in the past year (date may be approximate).     Assessment:   This is a routine wellness examination for [facility].  Hearing/Vision screen Hearing Screening Comments: Pt denies hearing difficulty  Vision Screening Comments: Annual vision screenings at [place] issues and exercise activities discussed: Current Exercise Habits: The patient does not participate in regular exercise at present, Exercise limited by: orthopedic condition(s)  Goals    . Patient Stated     Patient states she would like to stay active and possibly take some courses online.       Depression Screen PHQ 2/9 Scores 10/30/2018 08/21/2018 08/16/2018 07/09/2018 05/09/2018 10/09/2017 09/22/2017  PHQ - 2 Score 0 0 0 0 0 0 0  PHQ- 9 Score - 0 0 0 1 - -    Fall Risk Fall Risk  10/30/2018 08/21/2018 08/16/2018 07/30/2018 07/09/2018  Falls in the past year? 0 0 0 0 0  Number falls in past yr: 0 0 0 - -  Injury with Fall? 0 0 0 - -  Risk for fall due to : - - - - -  Follow up Falls prevention discussed - - - -   FALL RISK PREVENTION PERTAINING TO THE HOME:  Any stairs in or around the home? Yes  If so, do they handrails? Yes   Home free of loose throw rugs in walkways, pet beds, electrical cords, etc? Yes  Adequate lighting in your home to reduce risk of falls? Yes  ASSISTIVE DEVICES UTILIZED TO PREVENT FALLS:  Life alert? No  Use of a cane, walker or w/c? Yes  Grab bars in the bathroom? No  Shower chair or bench in shower? No  Elevated toilet seat or a handicapped toilet? No   DME ORDERS:  DME order needed?  No   TIMED UP AND GO:  Was the test performed? Yes .  Length of time to ambulate 10 feet: 7 sec.   GAIT:  Appearance of gait: Gait stead-fast and with the use of an assistive device.   Education: Fall risk prevention has been discussed.  Intervention(s) required? No   Cognitive Function:     6CIT  Screen 10/30/2018  What Year? 0 points  What month? 0 points  What time? 0 points  Count back from 20 0 points  Months in reverse 0 points  Repeat phrase 2 points  Total Score 2    Screening Tests Health Maintenance  Topic Date Due  . TETANUS/TDAP  01/08/1965  . OPHTHALMOLOGY EXAM  12/27/2017  . MAMMOGRAM  05/04/2018  . HEMOGLOBIN A1C  11/07/2018  . FOOT EXAM  05/10/2019  . COLONOSCOPY  04/11/2027  . INFLUENZA VACCINE  Completed  . DEXA SCAN  Completed  . Hepatitis C Screening  Completed  . PNA vac Low Risk Adult  Completed    Qualifies for Shingles Vaccine?  Yes  . Due for Shingrix. Education has been provided regarding the importance of this vaccine. Pt has been advised to call insurance company to determine out of pocket expense. Advised may also receive vaccine at local pharmacy or Health Dept. Verbalized acceptance and understanding.  Tdap: Although this vaccine is not a covered service during a Wellness Exam, does the patient still wish to receive this vaccine today?  No .  Education has been provided regarding the importance of this vaccine. Advised may receive this vaccine at local pharmacy or Health Dept. Aware to provide a copy of the vaccination record if obtained from local pharmacy or Health Dept. Verbalized acceptance and understanding.  Flu Vaccine: Up to date  Pneumococcal Vaccine: Up to date    Cancer Screenings:  Colorectal Screening: Completed 04/10/17. Repeat every 10 years.  Mammogram: Completed 05/04/17. Repeat every year. Ordered 07/09/18. Pt provided with contact information and advised to call to schedule appt.   Bone Density: Completed 09/26/13. Results reflect NORMAL, . Repeat every 2 years. Pt states she is unable to complete this exam to due nerve stimulator in her back.   Lung Cancer Screening: (Low Dose CT Chest recommended if Age 10-80 years, 30 pack-year currently smoking OR have quit w/in 15years.) does not qualify.   Additional  Screening:  Hepatitis C Screening: does qualify; Completed 05/26/17  Vision Screening: Recommended annual ophthalmology exams for early detection of glaucoma and other disorders of the eye. Is the patient up to date with their annual eye exam?  No  Who is the provider or what is the name of the office in which the pt attends annual eye exams? [facility]   Dental Screening: Recommended annual dental exams for proper oral hygiene  Community Resource Referral:  CRR required this visit?  No      Plan:    I have personally reviewed and addressed the Medicare Annual Wellness questionnaire and have noted the following in the patient's chart:  A. Medical and social history B. Use of alcohol, tobacco or illicit drugs  C. Current medications and supplements D. Functional ability and status E.  Nutritional status F.  Physical activity G. Advance directives H. List of other physicians I.  Hospitalizations, surgeries, and ER visits in previous 12 months J.  [place] such as hearing and vision if needed, cognitive and depression L. Referrals and appointments   In addition, I have reviewed and discussed with patient certain preventive protocols, quality metrics, and best practice recommendations. A written personalized care plan for preventive services as well as general preventive health recommendations were provided to patient.   Signed,  Clemetine Marker, LPN Nurse Health Advisor   Nurse Notes: pt c/o hip and back pain and headache. She is seeing Benjamine Mola today for an acute visit.

## 2018-10-30 NOTE — Patient Instructions (Signed)
Lori Potts , Thank you for taking time to come for your Medicare Wellness Visit. I appreciate your ongoing commitment to your health goals. Please review the following plan we discussed and let me know if I can assist you in the future.   Screening recommendations/referrals: Colonoscopy: done 04/10/17.  Mammogram: done 05/04/17. Please call [phone] to schedule your mammogram.  Bone Density: done 09/26/13. Recommended yearly ophthalmology/optometry visit for glaucoma screening and checkup Recommended yearly dental visit for hygiene and checkup  Vaccinations: Influenza vaccine: done 05/09/18 Pneumococcal vaccine: done 03/16/18 Tdap vaccine: due - please contact us if you get a cut or scrape Shingles vaccine: Shingrix discussed. Please contact your pharmacy for coverage information.   Advanced directives: Please bring a copy of your health care power of attorney and living will to the office at your convenience.  Conditions/risks identified: Continue healthy eating to maintain A1c.   Next appointment: Please follow up in one year for your Medicare Annual Wellness visit.     Preventive Care 3 Years and Older, Female Preventive care refers to lifestyle choices and visits with your health care provider that can promote health and wellness. What does preventive care include?  A yearly physical exam. This is also called an annual well check.  Dental exams once or twice a year.  Routine eye exams. Ask your health care provider how often you should have your eyes checked.  Personal lifestyle choices, including:  Daily care of your teeth and gums.  Regular physical activity.  Eating a healthy diet.  Avoiding tobacco and drug use.  Limiting alcohol use.  Practicing safe sex.  Taking low-dose aspirin every day.  Taking vitamin and mineral supplements as recommended by your health care provider. What happens during an annual well check? The services and screenings done by  your health care provider during your annual well check will depend on your age, overall health, lifestyle risk factors, and family history of disease. Counseling  Your health care provider may ask you questions about your:  Alcohol use.  Tobacco use.  Drug use.  Emotional well-being.  Home and relationship well-being.  Sexual activity.  Eating habits.  History of falls.  Memory and ability to understand (cognition).  Work and work Astronomer.  Reproductive health. Screening  You may have the following tests or measurements:  Height, weight, and BMI.  Blood pressure.  Lipid and cholesterol levels. These may be checked every 5 years, or more frequently if you are over 4 years old.  Skin check.  Lung cancer screening. You may have this screening every year starting at age 29 if you have a 30-pack-year history of smoking and currently smoke or have quit within the past 15 years.  Fecal occult blood test (FOBT) of the stool. You may have this test every year starting at age 90.  Flexible sigmoidoscopy or colonoscopy. You may have a sigmoidoscopy every 5 years or a colonoscopy every 10 years starting at age 72.  Hepatitis C blood test.  Hepatitis B blood test.  Sexually transmitted disease (STD) testing.  Diabetes screening. This is done by checking your blood sugar (glucose) after you have not eaten for a while (fasting). You may have this done every 1-3 years.  Bone density scan. This is done to screen for osteoporosis. You may have this done starting at age 39.  Mammogram. This may be done every 1-2 years. Talk to your health care provider about how often you should have regular mammograms. Talk with your health care  provider about your test results, treatment options, and if necessary, the need for more tests. Vaccines  Your health care provider may recommend certain vaccines, such as:  Influenza vaccine. This is recommended every year.  Tetanus,  diphtheria, and acellular pertussis (Tdap, Td) vaccine. You may need a Td booster every 10 years.  Zoster vaccine. You may need this after age 22.  Pneumococcal 13-valent conjugate (PCV13) vaccine. One dose is recommended after age 18.  Pneumococcal polysaccharide (PPSV23) vaccine. One dose is recommended after age 72. Talk to your health care provider about which screenings and vaccines you need and how often you need them. This information is not intended to replace advice given to you by your health care provider. Make sure you discuss any questions you have with your health care provider. Document Released: 09/04/2015 Document Revised: 04/27/2016 Document Reviewed: 06/09/2015 Elsevier Interactive Patient Education  2017 [place] Prevention in the Home Falls can cause injuries. They can happen to people of all ages. There are many things you can do to make your home safe and to help prevent falls. What can I do on the outside of my home?  Regularly fix the edges of walkways and driveways and fix any cracks.  Remove anything that might make you trip as you walk through a door, such as a raised step or threshold.  Trim any bushes or trees on the path to your home.  Use bright outdoor lighting.  Clear any walking paths of anything that might make someone trip, such as rocks or tools.  Regularly check to see if handrails are loose or broken. Make sure that both sides of any steps have handrails.  Any raised decks and porches should have guardrails on the edges.  Have any leaves, snow, or ice cleared regularly.  Use sand or salt on walking paths during winter.  Clean up any spills in your garage right away. This includes oil or grease spills. What can I do in the bathroom?  Use night lights.  Install grab bars by the toilet and in the tub and shower. Do not use towel bars as grab bars.  Use non-skid mats or decals in the tub or shower.  If you need to sit down in  the shower, use a plastic, non-slip stool.  Keep the floor dry. Clean up any water that spills on the floor as soon as it happens.  Remove soap buildup in the tub or shower regularly.  Attach bath mats securely with double-sided non-slip rug tape.  Do not have throw rugs and other things on the floor that can make you trip. What can I do in the bedroom?  Use night lights.  Make sure that you have a light by your bed that is easy to reach.  Do not use any sheets or blankets that are too big for your bed. They should not hang down onto the floor.  Have a firm chair that has side arms. You can use this for support while you get dressed.  Do not have throw rugs and other things on the floor that can make you trip. What can I do in the kitchen?  Clean up any spills right away.  Avoid walking on wet floors.  Keep items that you use a lot in easy-to-reach places.  If you need to reach something above you, use a strong step stool that has a grab bar.  Keep electrical cords out of the way.  Do not use floor polish  or wax that makes floors slippery. If you must use wax, use non-skid floor wax.  Do not have throw rugs and other things on the floor that can make you trip. What can I do with my stairs?  Do not leave any items on the stairs.  Make sure that there are handrails on both sides of the stairs and use them. Fix handrails that are broken or loose. Make sure that handrails are as long as the stairways.  Check any carpeting to make sure that it is firmly attached to the stairs. Fix any carpet that is loose or worn.  Avoid having throw rugs at the top or bottom of the stairs. If you do have throw rugs, attach them to the floor with carpet tape.  Make sure that you have a light switch at the top of the stairs and the bottom of the stairs. If you do not have them, ask someone to add them for you. What else can I do to help prevent falls?  Wear shoes that:  Do not have high  heels.  Have rubber bottoms.  Are comfortable and fit you well.  Are closed at the toe. Do not wear sandals.  If you use a stepladder:  Make sure that it is fully opened. Do not climb a closed stepladder.  Make sure that both sides of the stepladder are locked into place.  Ask someone to hold it for you, if possible.  Clearly mark and make sure that you can see:  Any grab bars or handrails.  First and last steps.  Where the edge of each step is.  Use tools that help you move around (mobility aids) if they are needed. These include:  Canes.  Walkers.  Scooters.  Crutches.  Turn on the lights when you go into a dark area. Replace any light bulbs as soon as they burn out.  Set up your furniture so you have a clear path. Avoid moving your furniture around.  If any of your floors are uneven, fix them.  If there are any pets around you, be aware of where they are.  Review your medicines with your doctor. Some medicines can make you feel dizzy. This can increase your chance of falling. Ask your doctor what other things that you can do to help prevent falls. This information is not intended to replace advice given to you by your health care provider. Make sure you discuss any questions you have with your health care provider. Document Released: 06/04/2009 Document Revised: 01/14/2016 Document Reviewed: 09/12/2014 Elsevier Interactive Patient Education  2017 [employer].

## 2018-10-31 ENCOUNTER — Other Ambulatory Visit: Payer: Self-pay | Admitting: Pharmacy Technician

## 2018-10-31 NOTE — Patient Outreach (Signed)
[facility]) Care Management  10/31/2018  Lori Potts Apr 16, 1946 [id number]    Successful call placed to patient regarding patient assistance medication receipt from [employer], HIPAA identifiers verified. Lori Potts confirms that she received her Proventil HFA inhalers. Reviewed with patient how to obtain refills. Patient has no additional questions at this time about Proventil.   Patient inquired about patient assistance for her insulin. Informed her that program for Victoza has a OOP spend of $1000. Informed her that there are other programs for other medications and informed her I would reach out to [facility] RPh Lori Potts who is embedded in Lori Potts office. She stated that would be great!  Follow up:  Will route note to [facility] RPh Lori Potts. Lori Potts CPhT Certified Pharmacy Technician [facility] Care Management Direct Dial:[phone]

## 2018-11-02 ENCOUNTER — Ambulatory Visit
Admission: RE | Admit: 2018-11-02 | Discharge: 2018-11-02 | Disposition: A | Discharge status: Home / Self Care | Payer: Medicare HMO | Source: Ambulatory Visit | Attending: Chiropractic Medicine | Admitting: Chiropractic Medicine

## 2018-11-02 ENCOUNTER — Other Ambulatory Visit: Payer: Self-pay

## 2018-11-02 DIAGNOSIS — Z981 Arthrodesis status: Secondary | ICD-10-CM

## 2018-11-02 DIAGNOSIS — M545 Low back pain: Secondary | ICD-10-CM | POA: Diagnosis not present

## 2018-11-13 ENCOUNTER — Other Ambulatory Visit: Payer: Self-pay | Admitting: Family Medicine

## 2018-11-13 DIAGNOSIS — R809 Proteinuria, unspecified: Principal | ICD-10-CM

## 2018-11-13 DIAGNOSIS — E1329 Other specified diabetes mellitus with other diabetic kidney complication: Secondary | ICD-10-CM

## 2018-11-13 DIAGNOSIS — Z794 Long term (current) use of insulin: Principal | ICD-10-CM

## 2018-11-14 ENCOUNTER — Other Ambulatory Visit: Payer: Self-pay | Admitting: Emergency Medicine

## 2018-11-14 DIAGNOSIS — Z794 Long term (current) use of insulin: Principal | ICD-10-CM

## 2018-11-14 DIAGNOSIS — E0829 Diabetes mellitus due to underlying condition with other diabetic kidney complication: Secondary | ICD-10-CM

## 2018-11-14 DIAGNOSIS — R809 Proteinuria, unspecified: Secondary | ICD-10-CM

## 2018-11-14 MED ORDER — LIRAGLUTIDE 18 MG/3ML ~~LOC~~ SOPN: 0.6000 mg | PEN_INJECTOR | Freq: Every day | SUBCUTANEOUS | 2 refills | Status: DC

## 2018-11-15 ENCOUNTER — Other Ambulatory Visit: Payer: Self-pay | Admitting: Family Medicine

## 2018-11-15 DIAGNOSIS — R809 Proteinuria, unspecified: Secondary | ICD-10-CM

## 2018-11-15 DIAGNOSIS — E0829 Diabetes mellitus due to underlying condition with other diabetic kidney complication: Secondary | ICD-10-CM

## 2018-11-15 DIAGNOSIS — Z794 Long term (current) use of insulin: Principal | ICD-10-CM

## 2018-11-15 MED ORDER — LIRAGLUTIDE 18 MG/3ML ~~LOC~~ SOPN: 0.6000 mg | PEN_INJECTOR | Freq: Every day | SUBCUTANEOUS | 2 refills | Status: DC

## 2018-11-19 ENCOUNTER — Encounter: Payer: Self-pay | Admitting: Nurse Practitioner

## 2018-11-19 ENCOUNTER — Ambulatory Visit (INDEPENDENT_AMBULATORY_CARE_PROVIDER_SITE_OTHER): Payer: Medicare HMO | Admitting: Nurse Practitioner

## 2018-11-19 ENCOUNTER — Ambulatory Visit: Payer: Medicare HMO | Admitting: Family Medicine

## 2018-11-19 ENCOUNTER — Other Ambulatory Visit: Payer: Self-pay

## 2018-11-19 VITALS — Temp 97.3°F | Ht 66.0 in | Wt 178.0 lb

## 2018-11-19 DIAGNOSIS — J302 Other seasonal allergic rhinitis: Secondary | ICD-10-CM

## 2018-11-19 MED ORDER — SALINE SPRAY 0.65 % NA SOLN: 1.0000 | Freq: Two times a day (BID) | NASAL | 5 refills | Status: DC | PRN

## 2018-11-19 MED ORDER — CETIRIZINE HCL 10 MG PO TABS: 10.0000 mg | ORAL_TABLET | Freq: Every day | ORAL | 11 refills | Status: DC

## 2018-11-19 NOTE — Progress Notes (Signed)
Virtual Visit via Telephone Note  I connected with Lori Potts on 11/19/18 at  3:00 PM EDT by telephone and verified that I am speaking with the correct person using two identifiers.   I discussed the limitations, risks, security and privacy concerns of performing an evaluation and management service by telephone and the availability of in person appointments. I also discussed with the patient that there may be a patient responsible charge related to this service. The patient expressed understanding and agreed to proceed.   History of Present Illness: Patient endorses sinus and allergy symptoms started yesterday after she was outside all yesterday. Patient states allergies really got to her. Has had a lot of clear drainage, now has a lot of nasal congestion. Patient has dull headache- not painful just pressure behind eyes. Started to get congested cough- feels like her allergies.  Denies fever, shortness of breath, dental pain, sinus pain. Takes Singulair every day.  Takes fluticasone nasal spray 2 sprays in each nostril with some relief.    Observations/Objective: Alert, able to speak in full sentences without difficulty. No cough during visit  Assessment and Plan: 1. Seasonal allergies Discussed blackbox warning for singuilair, has been on it for years and has moderate allergies think it is appropriate to continue. Recommend avoiding triggers, taking daily cetirizine and nasal rinses during allergy season. Taking Flonase and Singulair as needed as well. Discussed if having acute worsening of symptoms, fevers, significant pain, or symptoms lasting greater than 10 days to let us know for re-evalaution - sodium chloride (OCEAN) 0.65 % SOLN nasal spray; Place 1 spray into both nostrils 2 (two) times daily as needed for congestion.  Dispense: 60 mL; Refill: 5 - cetirizine (ZYRTEC) 10 MG tablet; Take 1 tablet (10 mg total) by mouth daily.  Dispense: 30 tablet; Refill: 11   Follow Up  Instructions:   follow up as needed.   I discussed the assessment and treatment plan with the patient. The patient was provided an opportunity to ask questions and all were answered. The patient agreed with the plan and demonstrated an understanding of the instructions.   The patient was advised to call back or seek an in-person evaluation if the symptoms worsen or if the condition fails to improve as anticipated.  I provided 11 minutes of non-face-to-face time during this encounter.   Cheryle Horsfall, NP  \

## 2018-11-21 ENCOUNTER — Telehealth: Payer: Self-pay | Admitting: Family Medicine

## 2018-11-21 NOTE — Telephone Encounter (Signed)
Pt had virtual appt the other day and she wanted to inform you that now she has ear ache. Please advise

## 2018-11-21 NOTE — Telephone Encounter (Signed)
Can take acetaminophen for simple ear pain. If it does not resolve recommend re-evaluation.

## 2018-11-21 NOTE — Telephone Encounter (Signed)
Can she call in for a telephone visit this afternoon?

## 2018-11-21 NOTE — Telephone Encounter (Signed)
Please advise 

## 2018-11-21 NOTE — Telephone Encounter (Signed)
Patient stated that she has some at home and will take that.

## 2018-11-21 NOTE — Telephone Encounter (Signed)
Patient stated that she was just here and only needed to know if there is something that she needs to take for her earache. She has tried Flonase but still has earache.  Please advise.

## 2018-12-03 ENCOUNTER — Other Ambulatory Visit: Payer: Self-pay | Admitting: Family Medicine

## 2018-12-03 DIAGNOSIS — R809 Proteinuria, unspecified: Principal | ICD-10-CM

## 2018-12-03 DIAGNOSIS — E1129 Type 2 diabetes mellitus with other diabetic kidney complication: Secondary | ICD-10-CM

## 2018-12-04 ENCOUNTER — Ambulatory Visit (INDEPENDENT_AMBULATORY_CARE_PROVIDER_SITE_OTHER): Payer: Medicare HMO | Admitting: Pharmacist

## 2018-12-04 DIAGNOSIS — J4541 Moderate persistent asthma with (acute) exacerbation: Secondary | ICD-10-CM | POA: Diagnosis not present

## 2018-12-04 DIAGNOSIS — R809 Proteinuria, unspecified: Secondary | ICD-10-CM

## 2018-12-04 DIAGNOSIS — R030 Elevated blood-pressure reading, without diagnosis of hypertension: Secondary | ICD-10-CM

## 2018-12-04 DIAGNOSIS — Z794 Long term (current) use of insulin: Secondary | ICD-10-CM

## 2018-12-04 DIAGNOSIS — E0829 Diabetes mellitus due to underlying condition with other diabetic kidney complication: Secondary | ICD-10-CM | POA: Diagnosis not present

## 2018-12-04 NOTE — Patient Instructions (Signed)
Ms. Deblois was given information about Chronic Care Management services today including:  1. CCM service includes personalized support from designated clinical staff supervised by her physician, including individualized plan of care and coordination with other care providers 2. 24/7 contact phone numbers for assistance for urgent and routine care needs. 3. Service will only be billed when office clinical staff spend 20 minutes or more in a month to coordinate care. 4. Only one practitioner may furnish and bill the service in a calendar month. 5. The patient may stop CCM services at any time (effective at the end of the month) by phone call to the office staff. 6. The patient will be responsible for cost sharing (co-pay) of up to 20% of the service fee (after annual deductible is met).  Patient agreed to services and verbal consent obtained.     Thank you allowing the Chronic Care Management Team to be a part of your care!   Please call a member of the CCM (Chronic Care Management) Team with any questions or case management needs:   Vanetta Mulders, BSN Nurse Care Coordinator  [phone]  Ruben Reason, PharmD  Clinical Pharmacist  [phone]  Elliot Gurney, LCSW Clinical Social Worker [phone]  Goals Addressed            This Visit's Progress   . My Victoza is too expensive  (pt-stated)       Current Barriers:  . Financial . NovoNordisk (Victoza) requires $1000 out of pocket on prescriptions for med assist program   Pharmacist Clinical Goal(s): Over the next 14 days, [name] will provide the necessary supplementary documents (proof of out of pocket prescription expenditure, proof of household income) needed for medication assistance applications to CCM pharmacist.   Interventions: . CCM pharmacist will collaborate with PCP to transition from [place] to [place] . CCM pharmacist will apply for medication assistance program for Trulicity made by  [employer].   Patient Self Care Activities:  Marland Kitchen Gather necessary documents needed to apply for medication assistance  Initial goal documentation

## 2018-12-04 NOTE — Chronic Care Management (AMB) (Signed)
Chronic Care Management   Note  12/04/2018 Name: Lori Potts MRN: [id number] DOB: 30-May-1946  Subjective:   Does the patient  feel that his/her medications are working for him/her?  yes  Has the patient been experiencing any side effects to the medications prescribed?  no  Does the patient measure his/her own blood glucose at home?  yes   Does the patient measure his/her own blood pressure at home? yes   Does the patient have any problems obtaining medications due to transportation or finances?   yes  Understanding of regimen: good Understanding of indications: good Potential of compliance: poor  Objective: Lab Results  Component Value Date   CREATININE 0.58 07/09/2018   CREATININE 0.76 05/09/2018   CREATININE 0.68 01/09/2018    Lab Results  Component Value Date   HGBA1C 6.3 (H) 05/09/2018    Lipid Panel     Component Value Date/Time   CHOL 175 05/09/2018 0950   TRIG 71 05/09/2018 0950   HDL 61 05/09/2018 0950   CHOLHDL 2.9 05/09/2018 0950   VLDL 18 07/04/2016 1016   LDLCALC 98 05/09/2018 0950    BP Readings from Last 3 Encounters:  10/30/18 140/80  10/30/18 140/80  09/29/18 (!) 163/80    Allergies  Allergen Reactions  . Metformin And Related Diarrhea  . Adhesive [Tape] Rash and Other (See Comments)    Regular tape is ok, allergy is to paper tape    Medications Reviewed Today    Reviewed by Cathi Roan, [facility] (Pharmacist) on 12/04/18 at 1511  Med List Status: <None>  Medication Order Taking? Sig Documenting Provider Last Dose Status Informant  albuterol (PROVENTIL HFA;VENTOLIN HFA) 108 (90 Base) MCG/ACT inhaler [id number]  Inhale 1 puff into the lungs every 6 (six) hours as needed for wheezing or shortness of breath. [provider]  Active Self  aspirin EC 81 MG tablet [id number] Yes Take 81 mg by mouth daily. [provider] Taking Active Self  atorvastatin (LIPITOR) 80 MG tablet [id number] Yes TAKE 1 TABLET BY MOUTH EVERY DAY  Lada, Satira Anis, MD Taking Active Self  Blood Glucose Monitoring Suppl (TRUE METRIX METER) w/Device KIT [id number]  Use to check fingerstick blood sugars once a day; LON 99 months; E11.9 Arnetha Courser, MD  Active Self  BREO ELLIPTA 100-25 MCG/INH AEPB [id number] No TAKE 1 PUFF BY MOUTH EVERY DAY  Patient not taking:  Reported on 12/04/2018   Fredderick Severance, NP Not Taking Active            Med Note Kary Kos, [place] Dec 04, 2018  3:11 PM) Too expensive  cetirizine (ZYRTEC) 10 MG tablet [id number] Yes Take 1 tablet (10 mg total) by mouth daily. Fredderick Severance, NP Taking Active   glucose blood (TRUE METRIX BLOOD GLUCOSE TEST) test strip [id number]  Check fingerstick blood sugar once a day; LON 99 months; Dx E11.29 Arnetha Courser, MD  Active   ibuprofen (ADVIL,MOTRIN) 800 MG tablet [id number] Yes Take 1 tablet (800 mg total) by mouth every 8 (eight) hours as needed. Fredderick Severance, NP Taking Active   Insulin Pen Needle (BD ULTRA-FINE PEN NEEDLES) 29G X 12.7MM MISC [id number] Yes For use with injectable GLP-1 inhibitor, Victoza, once a day Lada, Satira Anis, MD Taking Active Self  ipratropium-albuterol (DUONEB) 0.5-2.5 (3) MG/3ML SOLN [id number]  Can take 43m every 20 minutes for 2 doses as needed for wheezing Poulose, EBethel Born NP  Active Self  Lancets (FREESTYLE) lancets [id number]  Yes Use to check fingerstick blood sugars once a day; LON 99 months; E11.9 Lada, Satira Anis, MD Taking Active Self  liraglutide (VICTOZA) 18 MG/3ML SOPN [id number] Yes Inject 0.1 mLs (0.6 mg total) into the skin daily. Fredderick Severance, NP Taking Active   losartan (COZAAR) 25 MG tablet [id number] Yes TAKE 1 TABLET BY MOUTH EVERY DAY Lada, Satira Anis, MD Taking Active Self  montelukast (SINGULAIR) 10 MG tablet [id number] Yes TAKE 1 TABLET BY MOUTH EVERYDAY AT BEDTIME Poulose, Bethel Born, NP Taking Active   oxyCODONE-acetaminophen (PERCOCET) 10-325 MG tablet [id number] Yes Take 1 tablet by mouth 5 (five) times  daily.  Levy Pupa, PA-C Taking Active Self  sodium chloride (OCEAN) 0.65 % SOLN nasal spray [id number]  Place 1 spray into both nostrils 2 (two) times daily as needed for congestion. Fredderick Severance, NP  Active            Assessment:   Intervention  YES NO  Explanation   Unnecessary drug therapy      Duplicate Therapy '[]'$  '[]'$     No indication '[x]'$  '[]'$  ASA 81 mg   Treating avoidable ADE '[]'$  '[]'$     Adherence      Cannot afford medication '[]'$  '[]'$     Cannot self-administer medication appropriately '[]'$  '[]'$     Does not understand directions '[]'$  '[]'$     Prefers not to take '[x]'$  '[]'$  atorvastatin   Product unavailable '[]'$  '[]'$     Forgets to take '[]'$  '[]'$     Different drug needed      Condition refractory to medication '[]'$  '[]'$     More effective medication available '[]'$  '[]'$     More cost-effective medication available '[x]'$  '[]'$  Trulicity is another GLP1 agonist with a med assistance program that does not require any OOP minimum   Other pertinent pharmacist  counseling      Goals Addressed            This Visit's Progress   . My Victoza is too expensive  (pt-stated)       Current Barriers:  . Financial . NovoNordisk (Victoza) requires $1000 out of pocket on prescriptions for med assist program   Pharmacist Clinical Goal(s): Over the next 14 days, [name] will provide the necessary supplementary documents (proof of out of pocket prescription expenditure, proof of household income) needed for medication assistance applications to CCM pharmacist.   Interventions: . CCM pharmacist will collaborate with PCP to transition from [place] to [place] . CCM pharmacist will apply for medication assistance program for Trulicity made by [employer].   Patient Self Care Activities:  Marland Kitchen Gather necessary documents needed to apply for medication assistance  Initial goal documentation         Time:  Time spent counseling patient: Additional time spent on charting: Review of patient status, including review  of consultants reports, laboratory and other test data, was performed as part of comprehensive evaluation and provision of chronic care management services.   Plan: Recommendations discussed with provider - DC Victoza - Add Trulicity - Increase dose  - Change Victoza to Trulicity for better adherence/tolerance/drug interaction/cost savings  Recommendations discussed with patient - Importance of atorvastatin adherence - DC Asa '81mg'$   Follow up: The CM team will reach out to the patient again over the next 7  days. Mail application to patient for Trulicity pending approval from Suezanne Cheshire, NP  Ruben Reason, PharmD Clinical Pharmacist [place] [phone]

## 2018-12-06 ENCOUNTER — Other Ambulatory Visit: Payer: Self-pay | Admitting: Pharmacy Technician

## 2018-12-06 ENCOUNTER — Other Ambulatory Visit: Payer: Self-pay | Admitting: Nurse Practitioner

## 2018-12-06 ENCOUNTER — Telehealth: Payer: Self-pay | Admitting: Nurse Practitioner

## 2018-12-06 MED ORDER — DULAGLUTIDE 0.75 MG/0.5ML ~~LOC~~ SOAJ: 0.7500 mg | SUBCUTANEOUS | 1 refills | Status: DC

## 2018-12-06 NOTE — Patient Outreach (Signed)
[facility]) Care Management  12/06/2018  VALEDA CUNNANE 1946/02/20 [id number]   In basket message received [facility] Embedded RPh Raynelle Fanning that patient being assessed for additional patient assistance needs.  Patient assistance completed for Proventil HFA and medication has been received. Will remove myself from care team and change patient's [facility] Active Status.  [name] Effie Shy CPhT Certified Pharmacy Technician [facility] Care Management Direct Dial:[phone]

## 2018-12-06 NOTE — Telephone Encounter (Signed)
LVM for pt to call the office to schedule a 6wk fu with Lanora Manis

## 2018-12-06 NOTE — Telephone Encounter (Signed)
Please stop your victoza and switch to once weekly trulicity as discussed with Lamount Cranker. I have sent a [facility] month supply to [facility] [place] with a refill. Please schedule a 6 week follow-up so we can see how this medication is working. We will can titrate this medication up if needed. Please let us know if you have any other questions of concerns.

## 2018-12-10 ENCOUNTER — Other Ambulatory Visit: Payer: Self-pay

## 2018-12-10 ENCOUNTER — Ambulatory Visit: Payer: Self-pay | Admitting: Pharmacist

## 2018-12-10 DIAGNOSIS — Z794 Long term (current) use of insulin: Principal | ICD-10-CM

## 2018-12-10 DIAGNOSIS — R809 Proteinuria, unspecified: Secondary | ICD-10-CM

## 2018-12-10 DIAGNOSIS — E0829 Diabetes mellitus due to underlying condition with other diabetic kidney complication: Secondary | ICD-10-CM

## 2018-12-10 DIAGNOSIS — R030 Elevated blood-pressure reading, without diagnosis of hypertension: Secondary | ICD-10-CM

## 2018-12-10 NOTE — Patient Instructions (Signed)
Goals Addressed            This Visit's Progress   . My Victoza is too expensive  (pt-stated)       Current Barriers:  . Financial . NovoNordisk (Victoza) requires $1000 out of pocket on prescriptions for med assist program   Pharmacist Clinical Goal(s): Over the next 14 days, [name] will provide the necessary supplementary documents (proof of out of pocket prescription expenditure, proof of household income) needed for medication assistance applications to CCM pharmacist.   Interventions: . CCM pharmacist will collaborate with PCP to transition from Victoza to Trulicity completed 12/10/18 . CCM pharmacist will apply for medication assistance program for Trulicity made by [employer]. Partially completed 12/10/18: application mailed to patient   Patient Self Care Activities:  Marland Kitchen Gather necessary documents needed to apply for medication assistance  Please see past updates related to this goal by clicking on the "Past Updates" button in the selected goal          The patient verbalized understanding of instructions provided today and declined a print copy of patient instruction materials.

## 2018-12-10 NOTE — Chronic Care Management (AMB) (Signed)
  Chronic Care Management   Follow Up Note   12/10/2018 Name: MARGEAUX MASSIE MRN: [id number] DOB: 1946-04-27  Referred by: Kerman Passey, MD Reason for referral : Chronic Care Management (Medication assistance)   KIMISHA KEITH is a 73 y.o. year old female who is a primary care patient of Lada, Janit Bern, MD. The CCM team was consulted for assistance with chronic disease management and care coordination needs.    Review of patient status, including review of consultants reports, relevant laboratory and other test results, and collaboration with appropriate care team members and the patient's provider was performed as part of comprehensive patient evaluation and provision of chronic care management services.    Goals Addressed            This Visit's Progress   . My Victoza is too expensive  (pt-stated)       Current Barriers:  . Financial . NovoNordisk (Victoza) requires $1000 out of pocket on prescriptions for med assist program   Pharmacist Clinical Goal(s): Over the next 14 days, [name] will provide the necessary supplementary documents (proof of out of pocket prescription expenditure, proof of household income) needed for medication assistance applications to CCM pharmacist.   Interventions: . CCM pharmacist will collaborate with PCP to transition from Victoza to Trulicity completed 12/10/18 . CCM pharmacist will apply for medication assistance program for Trulicity made by [employer]. Partially completed 12/10/18: application mailed to patient   Patient Self Care Activities:  Marland Kitchen Gather necessary documents needed to apply for medication assistance  Please see past updates related to this goal by clicking on the "Past Updates" button in the selected goal           Telephone follow up appointment with CCM team member scheduled for: 2 weeks, telephone with PharmD   Karalee Height, PharmD Clinical Pharmacist [facility]  [phone]

## 2018-12-24 ENCOUNTER — Ambulatory Visit: Payer: Self-pay | Admitting: Pharmacist

## 2018-12-24 ENCOUNTER — Telehealth: Payer: Self-pay | Admitting: Family Medicine

## 2018-12-24 DIAGNOSIS — R809 Proteinuria, unspecified: Secondary | ICD-10-CM

## 2018-12-24 DIAGNOSIS — E0829 Diabetes mellitus due to underlying condition with other diabetic kidney complication: Secondary | ICD-10-CM

## 2018-12-24 DIAGNOSIS — Z794 Long term (current) use of insulin: Principal | ICD-10-CM

## 2018-12-24 NOTE — Telephone Encounter (Signed)
Please let Lori Potts know that I reviewed her chart and do not see any history of Rx for phenergan; if she is having new onset nausea, she will need to be seen by Lanora Manis or myself. Thanks!

## 2018-12-24 NOTE — Telephone Encounter (Signed)
Pt requesting refill on Fennegan for nausea.

## 2018-12-24 NOTE — Chronic Care Management (AMB) (Signed)
  Chronic Care Management   Follow Up Note   12/24/2018 Name: Lori Potts MRN: [id number] DOB: [birth year]  Referred by: Kerman Passey, MD Reason for referral : Chronic Care Management (Medication assistance Trulicity)   [name] is a 73 y.o. year old female who is a primary care patient of Lada, Janit Bern, MD. The CCM team was consulted for assistance with chronic disease management and care coordination needs.    Review of patient status, including review of consultants reports, relevant laboratory and other test results, and collaboration with appropriate care team members and the patient's provider was performed as part of comprehensive patient evaluation and provision of chronic care management services.    Assessment: #Medication assistance: Patient has submitted completed [employer]; CCM pharmacist faxed completed application and proof of income to [employer], uploaded application under media tab.   Goals Addressed            This Visit's Progress   . My Victoza is too expensive  (pt-stated)       Current Barriers:  . Financial . NovoNordisk (Victoza) requires $1000 out of pocket on prescriptions for med assist program   Pharmacist Clinical Goal(s): Over the next 14 days, [name] will provide the necessary supplementary documents (proof of out of pocket prescription expenditure, proof of household income) needed for medication assistance applications to CCM pharmacist.   Interventions: . CCM pharmacist will collaborate with PCP to transition from Victoza to Trulicity completed 12/10/18 . CCM pharmacist will apply for medication assistance program for Trulicity made by [employer]. Partially completed 12/10/18: application mailed to patient  . Completed 12/24/18: Completed application turned in to CCM pharmacist and faxed to [facility] for determination; copy of application uploaded under media tab  Patient Self Care Activities:  Marland Kitchen Gather necessary  documents needed to apply for medication assistance  Please see past updates related to this goal by clicking on the "Past Updates" button in the selected goal           The CM team will reach out to the patient again over the next 7 days. - will follow up with [facility] and [facility] regarding assistance determination and then follow up with patient via telephone.    Karalee Height, PharmD Clinical Pharmacist [facility] [phone]

## 2018-12-24 NOTE — Patient Instructions (Signed)
Goals Addressed            This Visit's Progress   . My Victoza is too expensive  (pt-stated)       Current Barriers:  . Financial . NovoNordisk (Victoza) requires $1000 out of pocket on prescriptions for med assist program   Pharmacist Clinical Goal(s): Over the next 14 days, [name] will provide the necessary supplementary documents (proof of out of pocket prescription expenditure, proof of household income) needed for medication assistance applications to CCM pharmacist.   Interventions: . CCM pharmacist will collaborate with PCP to transition from Victoza to Trulicity completed 12/10/18 . CCM pharmacist will apply for medication assistance program for Trulicity made by [employer]. Partially completed 12/10/18: application mailed to patient  . Completed 12/24/18: Completed application turned in to CCM pharmacist and faxed to [facility] for determination; copy of application uploaded under media tab  Patient Self Care Activities:  Marland Kitchen Gather necessary documents needed to apply for medication assistance  Please see past updates related to this goal by clicking on the "Past Updates" button in the selected goal          The patient verbalized understanding of instructions provided today and declined a print copy of patient instruction materials.

## 2018-12-24 NOTE — Telephone Encounter (Signed)
Please call patient and see if she wants an appt

## 2018-12-24 NOTE — Telephone Encounter (Signed)
I only see Phenergan injections that was prescribed last year.

## 2018-12-25 ENCOUNTER — Other Ambulatory Visit: Payer: Self-pay

## 2018-12-25 ENCOUNTER — Ambulatory Visit (INDEPENDENT_AMBULATORY_CARE_PROVIDER_SITE_OTHER): Payer: Medicare HMO | Admitting: Nurse Practitioner

## 2018-12-25 ENCOUNTER — Encounter: Payer: Self-pay | Admitting: Nurse Practitioner

## 2018-12-25 ENCOUNTER — Telehealth: Payer: Self-pay

## 2018-12-25 VITALS — BP 152/74 | HR 81 | Temp 97.2°F | Ht 66.0 in | Wt 177.0 lb

## 2018-12-25 DIAGNOSIS — I10 Essential (primary) hypertension: Secondary | ICD-10-CM

## 2018-12-25 DIAGNOSIS — R51 Headache: Secondary | ICD-10-CM | POA: Diagnosis not present

## 2018-12-25 DIAGNOSIS — G8929 Other chronic pain: Secondary | ICD-10-CM

## 2018-12-25 MED ORDER — PROMETHAZINE HCL 12.5 MG PO TABS: 12.5000 mg | ORAL_TABLET | Freq: Three times a day (TID) | ORAL | 0 refills | Status: DC | PRN

## 2018-12-25 MED ORDER — KETOROLAC TROMETHAMINE 60 MG/2ML IM SOLN
30.0000 mg | Freq: Once | INTRAMUSCULAR | Status: AC
  Administered 2018-12-25: 30 mg via INTRAMUSCULAR

## 2018-12-25 NOTE — Progress Notes (Signed)
Virtual Visit via Video Note  I connected with Lori Potts 12/25/18 at  8:40 AM EDT by a video enabled telemedicine application and verified that I am speaking with the correct person using two identifiers.   Staff discussed the limitations of evaluation and management by telemedicine and the availability of in person appointments. The patient expressed understanding and agreed to proceed.  Patient location: home  My location: home office Other people present: husband- non-contributory in room.  HPI  Patient endorses migraine that started yesterday morning, she took tylenol without relief. States headache, nausea, light sensitivity. Right sided migraine- feels typical of her chronic headache pain.  Denies weakness, vision changes, paresthesia.   Patient endorses chronic sciatic pain and bulging discs and her pain has been so bad. She is seeing [place] [facility]- plan for her to get a cortisone shot.   She states she is taking her blood pressure medications as prescribed but her home cuff is her husbands cuff and doesn't fit well, she additionally thinks its high because of her pain.   PHQ2/9: Depression screen [facility] 2/9 12/25/2018 11/19/2018 10/30/2018 10/30/2018 08/21/2018  Decreased Interest 0 0 0 0 0  Down, Depressed, Hopeless 0 0 0 0 0  PHQ - 2 Score 0 0 0 0 0  Altered sleeping 0 0 0 - 0  Tired, decreased energy 0 0 0 - 0  Change in appetite 0 0 0 - 0  Feeling bad or failure about yourself  0 0 0 - 0  Trouble concentrating 0 0 0 - 0  Moving slowly or fidgety/restless 0 0 0 - 0  Suicidal thoughts 0 0 0 - 0  PHQ-9 Score 0 0 0 - 0  Difficult doing work/chores Not difficult at all Not difficult at all Not difficult at all - Not difficult at all  Some recent data might be hidden    PHQ reviewed. Negative  Patient Active Problem List   Diagnosis Date Noted  . 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 Microalbuminuria 01/22/2018  . Anemia 09/22/2017  . S/P  knee replacement 07/28/2017  . Allergic rhinitis 05/26/2017  . Vitamin D deficiency 05/19/2017  . Chronic nonintractable headache 05/18/2017  . Special screening for malignant neoplasms, colon   . Gastroesophageal reflux disease   . Gastritis without bleeding   . 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 Insomnia 11/29/2016  . 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 Vasomotor flushing 04/19/2016  . Abdominal wall pain in right flank 03/29/2016  . Diverticulosis 02/24/2016  . Hypercholesterolemia 06/16/2015  . FHx: migraine headaches 04/13/2015  . Chronic pain 04/13/2015  . Neck pain 02/26/2015  . Status post lumbar surgery 02/26/2015  . Abnormal ECG 01/27/2015  . Angina pectoris ([place]) 01/27/2015  . 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[place]) 01/27/2015    Past Medical History:  Diagnosis Date  . Arthritis   . Asthma   . 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 Cataract    right eye but immature  . Essential hypertension, benign    takes Lisinopril-HCTZ daily  . GERD (gastroesophageal reflux disease)   . Headache    sinus  . History of colon polyps    benign  . History of migraine   . History of shingles   . Hyperlipidemia    takes Lipitor daily  . Low back pain   . Nocturia   . S/P insertion of spinal cord stimulator   . Seasonal allergies  takes Singulair daily as needed  . Type II or unspecified type diabetes mellitus without mention of complication, not stated as uncontrolled   . Vitamin D deficiency    takes Vit D weekly  . Weakness    numbness and tingling left arm  . Wears dentures    full upper and lower    Past Surgical History:  Procedure Laterality Date  . ABDOMINAL HYSTERECTOMY    . ANTERIOR CERVICAL DECOMP/DISCECTOMY FUSION N/A 02/26/2015   Procedure: ANTERIOR CERVICAL DISCECTOMY FUSION C4-5 (1 LEVEL);  Surgeon: Melina Schools, MD;  Location: [place];  Service: Orthopedics;   Laterality: N/A;  . ANTERIOR LAT LUMBAR FUSION N/A 03/21/2013   Procedure: ANTERIOR LATERAL LUMBAR FUSION 1 LEVEL/ XLIF L3-L4 ;  Surgeon: Melina Schools, MD;  Location: [place];  Service: Orthopedics;  Laterality: N/A;  . APPENDECTOMY    . AUGMENTATION MAMMAPLASTY Bilateral 1978  . BACK SURGERY    . BACK SURGERY     Lumbar fusion x 2  . BREAST EXCISIONAL BIOPSY Right 1970  . BREAST SURGERY  1990   Augementation  . CARDIAC CATHETERIZATION  5/12   ef 55%  . CARDIAC CATHETERIZATION  10/2010   [facility]; EF 55%  . CARDIAC CATHETERIZATION Left 02/16/2016   Procedure: Left Heart Cath and Coronary Angiography;  Surgeon: Yolonda Kida, MD;  Location: [place] [facility];  Service: Cardiovascular;  Laterality: Left;  . COLONOSCOPY    . COLONOSCOPY WITH PROPOFOL N/A 04/10/2017   Procedure: COLONOSCOPY WITH PROPOFOL;  Surgeon: Lucilla Lame, MD;  Location: [place];  Service: Gastroenterology;  Laterality: N/A;  Diabetic - insulin  . ESOPHAGOGASTRODUODENOSCOPY    . [place]   growth removed from left kidney   . LEFT HEART CATH AND CORONARY ANGIOGRAPHY Left 09/10/2018   Procedure: LEFT HEART CATH AND CORONARY ANGIOGRAPHY;  Surgeon: Yolonda Kida, MD;  Location: [place] [facility];  Service: Cardiovascular;  Laterality: Left;  . pain stimulator    . POSTERIOR CERVICAL FUSION/FORAMINOTOMY Right 03/21/2013   Procedure: POSTERIOR L2-3 RIGHT FORAMINOTOMY;  Surgeon: Melina Schools, MD;  Location: [place];  Service: Orthopedics;  Laterality: Right;  . REVISION OF SCAR TISSUE RECTUS MUSCLE    . SMALL BOWEL REPAIR    . SPINAL CORD STIMULATOR BATTERY EXCHANGE N/A 10/17/2012   Procedure: SPINAL CORD STIMULATOR BATTERY REMOVAL;  Surgeon: Melina Schools, MD;  Location: [place];  Service: Orthopedics;  Laterality: N/A;  . SPINAL CORD STIMULATOR BATTERY EXCHANGE N/A 07/22/2015   Procedure: REIMPLANTATION OF SPINAL CORD STIMULATOR BATTERY ;  Surgeon: Melina Schools, MD;  Location: [place];  Service:  Orthopedics;  Laterality: N/A;  . TOTAL KNEE ARTHROPLASTY Left   . TOTAL KNEE ARTHROPLASTY Right 07/28/2017   Procedure: RIGHT TOTAL KNEE ARTHROPLASTY;  Surgeon: Sydnee Cabal, MD;  Location: [facility];  Service: Orthopedics;  Laterality: Right;    Social History   Tobacco Use  . Smoking status: Former Smoker    Types: Cigarettes    Last attempt to quit: 2006    Years since quitting: 14.3  . Smokeless tobacco: Never Used  . Tobacco comment: quit smoking 72yr ago  Substance Use Topics  . Alcohol use: No     Current Outpatient Medications:  .  albuterol (PROVENTIL HFA;VENTOLIN HFA) 108 (90 Base) MCG/ACT inhaler, Inhale 1 puff into the lungs every 6 (six) hours as needed for wheezing or shortness of breath., Disp: , Rfl:  .  aspirin EC 81 MG tablet, Take 81 mg by  mouth daily., Disp: , Rfl:  .  atorvastatin (LIPITOR) 80 MG tablet, TAKE 1 TABLET BY MOUTH EVERY DAY, Disp: 7 tablet, Rfl: 0 .  Blood Glucose Monitoring Suppl (TRUE METRIX METER) w/Device KIT, Use to check fingerstick blood sugars once a day; LON 99 months; E11.9, Disp: 1 kit, Rfl: 0 .  BREO ELLIPTA 100-25 MCG/INH AEPB, TAKE 1 PUFF BY MOUTH EVERY DAY (Patient not taking: Reported on 12/04/2018), Disp: 60 each, Rfl: 1 .  cetirizine (ZYRTEC) 10 MG tablet, Take 1 tablet (10 mg total) by mouth daily., Disp: 30 tablet, Rfl: 11 .  Dulaglutide (TRULICITY) 6.96 EX/5.2WU SOPN, Inject 0.75 mg into the skin once a week., Disp: 4 pen, Rfl: 1 .  glucose blood (TRUE METRIX BLOOD GLUCOSE TEST) test strip, Check fingerstick blood sugar once a day; LON 99 months; Dx E11.29, Disp: 100 each, Rfl: 1 .  ibuprofen (ADVIL,MOTRIN) 800 MG tablet, Take 1 tablet (800 mg total) by mouth every 8 (eight) hours as needed., Disp: 30 tablet, Rfl: 0 .  Insulin Pen Needle (BD ULTRA-FINE PEN NEEDLES) 29G X 12.7MM MISC, For use with injectable GLP-1 inhibitor, Victoza, once a day, Disp: 100 each, Rfl: 3 .  ipratropium-albuterol (DUONEB) 0.5-2.5 (3) MG/3ML SOLN, Can  take 22m every 20 minutes for 2 doses as needed for wheezing, Disp: 50 mL, Rfl: 0 .  Lancets (FREESTYLE) lancets, Use to check fingerstick blood sugars once a day; LON 99 months; E11.9, Disp: 100 each, Rfl: 3 .  losartan (COZAAR) 25 MG tablet, TAKE 1 TABLET BY MOUTH EVERY DAY, Disp: 30 tablet, Rfl: 5 .  methocarbamol (ROBAXIN) 500 MG tablet, , Disp: , Rfl:  .  montelukast (SINGULAIR) 10 MG tablet, TAKE 1 TABLET BY MOUTH EVERYDAY AT BEDTIME, Disp: 90 tablet, Rfl: 3 .  oxyCODONE-acetaminophen (PERCOCET) 10-325 MG tablet, Take 1 tablet by mouth 5 (five) times daily. , Disp: , Rfl: 0 .  sodium chloride (OCEAN) 0.65 % SOLN nasal spray, Place 1 spray into both nostrils 2 (two) times daily as needed for congestion., Disp: 60 mL, Rfl: 5  Allergies  Allergen Reactions  . Metformin And Related Diarrhea  . Adhesive [Tape] Rash and Other (See Comments)    Regular tape is ok, allergy is to paper tape    ROS   No other specific complaints in a complete review of systems (except as listed in HPI above).  Objective  Vitals:   12/25/18 0811  BP: (!) 172/89  Pulse: 81  Temp: (!) 97.2 F (36.2 C)  TempSrc: Oral  Weight: 177 lb (80.3 kg)  Height: '5\' 6"'$  (1.676 m)    Body mass index is 28.57 kg/m.  Nursing Note and Vital Signs reviewed.  Physical Exam  Constitutional: Patient appears well-developed and well-nourished. No distress.  HENT: Head: Normocephalic and atraumatic. Cardiovascular: Normal rate Pulmonary/Chest: Effort normal  Musculoskeletal: Normal range of motion,  Neurological: he is alert and oriented . speech  normal.  Psychiatric: Patient has a normal mood and affect. behavior is normal. Judgment and thought content normal.    Assessment & Plan  1. Chronic nonintractable headache, unspecified headache type Seems typical of patients headache syndrome  - promethazine (PHENERGAN) 12.5 MG tablet; Take 1 tablet (12.5 mg total) by mouth every 8 (eight) hours as needed for nausea  or vomiting.  Dispense: 10 tablet; Refill: 0 - ketorolac (TORADOL) injection 30 mg  2. Essential hypertension Will come to office for injections and BP check, will have one week follow-up if still elevated.  Follow Up Instructions:   1 week for BP  I discussed the assessment and treatment plan with the patient. The patient was provided an opportunity to ask questions and all were answered. The patient agreed with the plan and demonstrated an understanding of the instructions.   The patient was advised to call back or seek an in-person evaluation if the symptoms worsen or if the condition fails to improve as anticipated.  I provided 14 minutes of non-face-to-face time during this encounter.   Fredderick Severance, NP

## 2019-01-01 ENCOUNTER — Ambulatory Visit: Payer: Self-pay | Admitting: Pharmacist

## 2019-01-01 DIAGNOSIS — R809 Proteinuria, unspecified: Secondary | ICD-10-CM

## 2019-01-01 DIAGNOSIS — E0829 Diabetes mellitus due to underlying condition with other diabetic kidney complication: Secondary | ICD-10-CM

## 2019-01-01 NOTE — Patient Instructions (Signed)
Congratulations! You have met all case management goals! You may call the case management team at any time should you have a question or if you have new case management needs. We are happy to help you! We will let your doctor know that you have met your goals.    Thank you allowing the Chronic Care Management Team to be a part of your care!   Please call a member of the CCM (Chronic Care Management) Team with any questions or case management needs in the future:   Trish Fountain, RN, BSN Nurse Care Coordinator  [phone]  Ruben Reason, PharmD  Clinical Pharmacist  [phone]

## 2019-01-01 NOTE — Chronic Care Management (AMB) (Signed)
  Chronic Care Management   Follow Up Note   01/01/2019 Name: Lori Potts MRN: [id number] DOB: 02-25-1946  Referred by: Arnetha Courser, MD Reason for referral : Chronic Care Management (Medication assistance)   Lori Potts is a 73 y.o. year old female who is a primary care patient of Lada, Satira Anis, MD. The CCM team was consulted for assistance with chronic disease management and care coordination needs.    Review of patient status, including review of consultants reports, relevant laboratory and other test results, and collaboration with appropriate care team members and the patient's provider was performed as part of comprehensive patient evaluation and provision of chronic care management services.   Assessment: Medication Assistance: Patient is has been approved for Trulicity made by [employer] and prescribed by Dr. Enid Derry.  Approved 12/26/2018 and expires 08/22/19.  Patient has received medications via mail. Explained any applicable refill process.     Goals Addressed            This Visit's Progress   . COMPLETED: My Victoza is too expensive  (pt-stated)       Current Barriers:  . Financial . NovoNordisk (Victoza) requires $1000 out of pocket on prescriptions for med assist program   Pharmacist Clinical Goal(s): Over the next 14 days, Lori Potts will provide the necessary supplementary documents (proof of out of pocket prescription expenditure, proof of household income) needed for medication assistance applications to CCM pharmacist.   Interventions: . CCM pharmacist will collaborate with PCP to transition from [place] to [place] completed 4/66/59 . CCM pharmacist will apply for medication assistance program for Trulicity made by [employer]. Partially completed 9/35/70: application mailed to patient  . Completed 08/28/77: Completed application turned in to CCM pharmacist and faxed to [facility] for determination; copy of application uploaded under media tab   Patient Self Care Activities:  Marland Kitchen Gather necessary documents needed to apply for medication assistance  Please see past updates related to this goal by clicking on the "Past Updates" button in the selected goal          Follow up: The patient has been provided with contact information for the chronic care management team and has been advised to call with any health related questions or concerns.  No further follow up required: has met care management goals  Ruben Reason, PharmD Clinical Pharmacist [facility] [phone]

## 2019-01-21 DIAGNOSIS — M5416 Radiculopathy, lumbar region: Secondary | ICD-10-CM | POA: Diagnosis not present

## 2019-01-21 DIAGNOSIS — M62830 Muscle spasm of back: Secondary | ICD-10-CM | POA: Diagnosis not present

## 2019-02-11 ENCOUNTER — Ambulatory Visit (INDEPENDENT_AMBULATORY_CARE_PROVIDER_SITE_OTHER): Payer: Medicare HMO

## 2019-02-11 ENCOUNTER — Other Ambulatory Visit: Payer: Self-pay

## 2019-02-11 ENCOUNTER — Encounter: Payer: Self-pay | Admitting: Emergency Medicine

## 2019-02-11 ENCOUNTER — Ambulatory Visit
Admission: EM | Admit: 2019-02-11 | Discharge: 2019-02-11 | Disposition: A | Discharge status: Home / Self Care | Payer: Medicare HMO | Attending: Emergency Medicine | Admitting: Emergency Medicine

## 2019-02-11 DIAGNOSIS — W19XXXA Unspecified fall, initial encounter: Secondary | ICD-10-CM

## 2019-02-11 DIAGNOSIS — M79662 Pain in left lower leg: Secondary | ICD-10-CM

## 2019-02-11 DIAGNOSIS — M25562 Pain in left knee: Secondary | ICD-10-CM | POA: Diagnosis not present

## 2019-02-11 DIAGNOSIS — S8992XA Unspecified injury of left lower leg, initial encounter: Secondary | ICD-10-CM | POA: Diagnosis not present

## 2019-02-11 MED ORDER — MELOXICAM 7.5 MG PO TABS: 7.5000 mg | ORAL_TABLET | Freq: Every day | ORAL | 0 refills | Status: DC

## 2019-02-11 NOTE — ED Provider Notes (Signed)
[place]    CSN: [id number] Arrival date & time: 02/11/19  1704     History   Chief Complaint Chief Complaint  Patient presents with   Fall    HPI Lori Potts is a 73 y.o. female.   Lori Potts presents with complaints of left knee pain after a fall yesterday. States her leg "gave out" causing her to fall, landing on her left leg and hitting her shoulder on her outdoor gate. Has been ambulatory but with pain, pain improves with bearing weight on her toes rather than her heel. Her knee has been swollen, she has been applying ice. Has had a knee replacement to this knee. No new numbness or tingling. No head injury, shoulder pain or wrist pain. She takes oxycodone at baseline, no other medications for her pain. Hx of right sided low back pain.     ROS per HPI, negative if not otherwise mentioned.      Past Medical History:  Diagnosis Date   Arthritis    Asthma    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[place]) 03/02/2017   Cataract    right eye but immature   Essential hypertension, benign    takes Lisinopril-HCTZ daily   GERD (gastroesophageal reflux disease)    Headache    sinus   History of colon polyps    benign   History of migraine    History of shingles    Hyperlipidemia    takes Lipitor daily   Low back pain    Nocturia    S/P insertion of spinal cord stimulator    Seasonal allergies    takes Singulair daily as needed   Type II or unspecified type diabetes mellitus without mention of complication, not stated as uncontrolled    Vitamin D deficiency    takes Vit D weekly   Weakness    numbness and tingling left arm   Wears dentures    full upper and lower    Patient Active Problem List   Diagnosis Date Noted   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   Microalbuminuria 01/22/2018   Anemia 09/22/2017   S/P knee replacement 07/28/2017   Allergic rhinitis 05/26/2017   Vitamin D deficiency 05/19/2017    Chronic nonintractable headache 05/18/2017   Special screening for malignant neoplasms, colon    Gastroesophageal reflux disease    Gastritis without bleeding    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[place]) 03/02/2017   Insomnia 11/29/2016   Chronic right-sided low back pain with right-sided sciatica 04/19/2016   Vasomotor flushing 04/19/2016   Abdominal wall pain in right flank 03/29/2016   Diverticulosis 02/24/2016   Hypercholesterolemia 06/16/2015   FHx: migraine headaches 04/13/2015   Chronic pain 04/13/2015   Neck pain 02/26/2015   Status post lumbar surgery 02/26/2015   Abnormal ECG 01/27/2015   Angina pectoris ([place]) 01/27/2015   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[place]) 01/27/2015    Past Surgical History:  Procedure Laterality Date   ABDOMINAL HYSTERECTOMY     ANTERIOR CERVICAL DECOMP/DISCECTOMY FUSION N/A 02/26/2015   Procedure: ANTERIOR CERVICAL DISCECTOMY FUSION C4-5 (1 LEVEL);  Surgeon: Melina Schools, MD;  Location: [facility];  Service: Orthopedics;  Laterality: N/A;   ANTERIOR LAT LUMBAR FUSION N/A 03/21/2013   Procedure: ANTERIOR LATERAL LUMBAR FUSION 1 LEVEL/ XLIF L3-L4 ;  Surgeon: Melina Schools, MD;  Location: [facility];  Service: Orthopedics;  Laterality: N/A;   APPENDECTOMY     AUGMENTATION MAMMAPLASTY Bilateral 1978   BACK SURGERY  BACK SURGERY     Lumbar fusion x 2   BREAST EXCISIONAL BIOPSY Right 1970   BREAST SURGERY  1990   Augementation   CARDIAC CATHETERIZATION  5/12   ef 55%   CARDIAC CATHETERIZATION  10/2010   [facility]; EF 55%   CARDIAC CATHETERIZATION Left 02/16/2016   Procedure: Left Heart Cath and Coronary Angiography;  Surgeon: Yolonda Kida, MD;  Location: [facility];  Service: Cardiovascular;  Laterality: Left;   COLONOSCOPY     COLONOSCOPY WITH PROPOFOL N/A 04/10/2017   Procedure: COLONOSCOPY WITH PROPOFOL;  Surgeon: Lucilla Lame, MD;  Location: [facility];  Service: Gastroenterology;   Laterality: N/A;  Diabetic - insulin   ESOPHAGOGASTRODUODENOSCOPY     KIDNEY SURGERY  1998   growth removed from left kidney    LEFT HEART CATH AND CORONARY ANGIOGRAPHY Left 09/10/2018   Procedure: LEFT HEART CATH AND CORONARY ANGIOGRAPHY;  Surgeon: Yolonda Kida, MD;  Location: [facility];  Service: Cardiovascular;  Laterality: Left;   pain stimulator     POSTERIOR CERVICAL FUSION/FORAMINOTOMY Right 03/21/2013   Procedure: POSTERIOR L2-3 RIGHT FORAMINOTOMY;  Surgeon: Melina Schools, MD;  Location: [facility];  Service: Orthopedics;  Laterality: Right;   REVISION OF SCAR TISSUE RECTUS MUSCLE     SMALL BOWEL REPAIR     SPINAL CORD STIMULATOR BATTERY EXCHANGE N/A 10/17/2012   Procedure: SPINAL CORD STIMULATOR BATTERY REMOVAL;  Surgeon: Melina Schools, MD;  Location: [place];  Service: Orthopedics;  Laterality: N/A;   SPINAL CORD STIMULATOR BATTERY EXCHANGE N/A 07/22/2015   Procedure: REIMPLANTATION OF SPINAL CORD STIMULATOR BATTERY ;  Surgeon: Melina Schools, MD;  Location: [facility];  Service: Orthopedics;  Laterality: N/A;   TOTAL KNEE ARTHROPLASTY Left    TOTAL KNEE ARTHROPLASTY Right 07/28/2017   Procedure: RIGHT TOTAL KNEE ARTHROPLASTY;  Surgeon: Sydnee Cabal, MD;  Location: [facility];  Service: Orthopedics;  Laterality: Right;    OB History    Gravida  0   Para  0   Term  0   Preterm  0   AB  0   Living        SAB  0   TAB  0   Ectopic  0   Multiple      Live Births               Home Medications    Prior to Admission medications   Medication Sig Start Date End Date Taking? Authorizing Provider  albuterol (PROVENTIL HFA;VENTOLIN HFA) 108 (90 Base) MCG/ACT inhaler Inhale 1 puff into the lungs every 6 (six) hours as needed for wheezing or shortness of breath.   Yes [provider]  aspirin EC 81 MG tablet Take 81 mg by mouth daily.   Yes [provider]  atorvastatin (LIPITOR) 80 MG tablet TAKE 1 TABLET BY MOUTH EVERY DAY 08/13/18   Yes Lada, Satira Anis, MD  Blood Glucose Monitoring Suppl (TRUE METRIX METER) w/Device KIT Use to check fingerstick blood sugars once a day; LON 99 months; E11.9 11/02/17  Yes Lada, Satira Anis, MD  cetirizine (ZYRTEC) 10 MG tablet Take 1 tablet (10 mg total) by mouth daily. 11/19/18  Yes Poulose, Bethel Born, NP  Dulaglutide (TRULICITY) 7.03 JK/0.9FG SOPN Inject 0.75 mg into the skin once a week. 12/06/18  Yes Poulose, Bethel Born, NP  glucose blood (TRUE METRIX BLOOD GLUCOSE TEST) test strip Check fingerstick blood sugar once a day; LON 99 months; Dx E11.29 12/03/18  Yes Lada,  Satira Anis, MD  ibuprofen (ADVIL,MOTRIN) 800 MG tablet Take 1 tablet (800 mg total) by mouth every 8 (eight) hours as needed. 09/04/18  Yes Poulose, Bethel Born, NP  ipratropium-albuterol (DUONEB) 0.5-2.5 (3) MG/3ML SOLN Can take 23m every 20 minutes for 2 doses as needed for wheezing 08/16/18  Yes Poulose, EBethel Born NP  losartan (COZAAR) 25 MG tablet TAKE 1 TABLET BY MOUTH EVERY DAY 07/23/18  Yes Lada, MSatira Anis MD  methocarbamol (ROBAXIN) 500 MG tablet  12/07/18  Yes [provider]  montelukast (SINGULAIR) 10 MG tablet TAKE 1 TABLET BY MOUTH EVERYDAY AT BEDTIME 11/15/18  Yes Poulose, EBethel Born NP  oxyCODONE-acetaminophen (PERCOCET) 10-325 MG tablet Take 1 tablet by mouth 5 (five) times daily.  03/06/18  Yes KLevy Pupa PA-C  promethazine (PHENERGAN) 12.5 MG tablet Take 1 tablet (12.5 mg total) by mouth every 8 (eight) hours as needed for nausea or vomiting. 12/25/18  Yes Poulose, EBethel Born NP  sodium chloride (OCEAN) 0.65 % SOLN nasal spray Place 1 spray into both nostrils 2 (two) times daily as needed for congestion. 11/19/18  Yes Poulose, EBethel Born NP  BREO ELLIPTA 100-25 MCG/INH AEPB TAKE 1 PUFF BY MOUTH EVERY DAY 10/22/18   Poulose, EBethel Born NP  Insulin Pen Needle (BD ULTRA-FINE PEN NEEDLES) 29G X 12.7MM MISC For use with injectable GLP-1 inhibitor, Victoza, once a day 10/31/17   LArnetha Courser MD  Lancets  (FREESTYLE) lancets Use to check fingerstick blood sugars once a day; LON 99 months; E11.9 11/07/17   Lada, MSatira Anis MD  meloxicam (MOBIC) 7.5 MG tablet Take 1 tablet (7.5 mg total) by mouth daily. 02/11/19   BZigmund Gottron NP    Family History Family History  Problem Relation Age of Onset   Heart attack Mother    Sarcoidosis Sister     Social History Social History   Tobacco Use   Smoking status: Former Smoker    Types: Cigarettes    Quit date: 2006    Years since quitting: 14.4   Smokeless tobacco: Never Used   Tobacco comment: quit smoking [place]  Substance Use Topics   Alcohol use: No   Drug use: No     Allergies   Metformin and related and Adhesive [tape]   Review of Systems Review of Systems   Physical Exam Triage Vital Signs ED Triage Vitals  Enc Vitals Group     BP 02/11/19 1723 (!) 163/81     Pulse Rate 02/11/19 1723 72     Resp 02/11/19 1723 18     Temp 02/11/19 1723 98.2 F (36.8 C)     Temp Source 02/11/19 1723 Oral     SpO2 02/11/19 1723 99 %     Weight 02/11/19 1720 185 lb (83.9 kg)     Height 02/11/19 1720 5' 3.5" (1.613 m)     Head Circumference --      Peak Flow --      Pain Score 02/11/19 1719 7     Pain Loc --      Pain Edu? --      Excl. in [place]--    No data found.  Updated Vital Signs BP (!) 163/81 (BP Location: Right Arm)    Pulse 72    Temp 98.2 F (36.8 C) (Oral)    Resp 18    Ht 5' 3.5" (1.613 m)    Wt 185 lb (83.9 kg)    SpO2 99%    BMI 32.26  kg/m   Visual Acuity Right Eye Distance:   Left Eye Distance:   Bilateral Distance:    Right Eye Near:   Left Eye Near:    Bilateral Near:     Physical Exam Constitutional:      General: She is not in acute distress.    Appearance: She is well-developed.  Cardiovascular:     Rate and Rhythm: Normal rate.  Pulmonary:     Effort: Pulmonary effort is normal.  Musculoskeletal:     Left knee: She exhibits swelling and bony tenderness. She exhibits normal range of  motion and no laceration.     Left ankle: Normal.     Left foot: Normal.     Comments: Foot without specific point tenderness or pain with ankle ROM; sensation intact. Full ROM of ankle and foot. Strong pedal pulse; pain and swelling just distal to patella and patellar tendon, at proximal tibia. No bruising or obvious effusion; mild pain with flexion  Skin:    General: Skin is warm and dry.  Neurological:     Mental Status: She is alert and oriented to person, place, and time.      UC Treatments / Results  Labs (all labs ordered are listed, but only abnormal results are displayed) Labs Reviewed - No data to display  EKG None  Radiology Dg Tibia/fibula Left  Result Date: 02/11/2019 CLINICAL DATA:  Initial evaluation for acute pain status post fall. EXAM: LEFT TIBIA AND FIBULA - 2 VIEW COMPARISON:  None. FINDINGS: No acute fracture dislocation. Left total knee arthroplasty in place, partially visualized. No visible complication. Limited views of the left ankle are unremarkable. Osseous mineralization normal. No acute soft tissue abnormality. Few scattered vascular phleboliths noted. IMPRESSION: No acute osseous abnormality about the left tibia/fibula. Electronically Signed   By: Jeannine Boga M.D.   On: 02/11/2019 18:53   Dg Knee Complete 4 Views Left  Result Date: 02/11/2019 CLINICAL DATA:  Initial evaluation for acute pain status post fall. EXAM: LEFT KNEE - COMPLETE 4+ VIEW COMPARISON:  Prior radiograph from 02/10/2011. FINDINGS: No acute fracture or dislocation. No joint effusion. Left total knee arthroplasty in place without complication. Osseous mineralization normal. No visible soft tissue injury. IMPRESSION: 1. No acute osseous abnormality about the left knee. 2. Left total knee arthroplasty in place without complication. Electronically Signed   By: Jeannine Boga M.D.   On: 02/11/2019 18:50    Procedures Procedures (including critical care time)  Medications  Ordered in UC Medications - No data to display  Initial Impression / Assessment and Plan / UC Course  I have reviewed the triage vital signs and the nursing notes.  Pertinent labs & imaging results that were available during my care of the patient were reviewed by me and considered in my medical decision making (see chart for details).     tib fib and knee films without acute findings. Strain vs contusion vs combination of both discussed and considered. Knee immobilizer provided for the next few days then to be active as tolerated. meloxicam daily. Encouraged follow up with orthopedist for recheck as needed. Patient verbalized understanding and agreeable to plan.   Final Clinical Impressions(s) / UC Diagnoses   Final diagnoses:  Acute pain of left knee  Fall, initial encounter     Discharge Instructions     Ice, elevation, daily meloxicam to help with pain.  Use of knee immobilizer for the next few days until symptoms improve.  Please follow up with your orthopedist  for recheck, especially as you felt it giving out.     ED Prescriptions    Medication Sig Dispense Auth. Provider   meloxicam (MOBIC) 7.5 MG tablet Take 1 tablet (7.5 mg total) by mouth daily. 20 tablet Zigmund Gottron, NP     Controlled Substance Prescriptions [place] Controlled Substance Registry consulted? Not Applicable   Zigmund Gottron, NP 02/11/19 2206

## 2019-02-11 NOTE — ED Triage Notes (Signed)
Pt had a fall last night and now c/o left leg pain. Area is swollen. She has had this knee replaced.

## 2019-02-11 NOTE — Discharge Instructions (Signed)
Ice, elevation, daily meloxicam to help with pain.  Use of knee immobilizer for the next few days until symptoms improve.  Please follow up with your orthopedist for recheck, especially as you felt it giving out.

## 2019-03-11 ENCOUNTER — Other Ambulatory Visit: Payer: Self-pay | Admitting: Pharmacy Technician

## 2019-03-11 NOTE — Patient Outreach (Signed)
[facility]) Care Management  03/11/2019  ARIELLAH FAUST [birth year] [id number]   Incoming call from patient stating she needs a refill on her Trulicity that [employer] got approved for her. Provided her with [facility] contact number to call and request.  Maud Deed. Chana Bode [place] Certified Pharmacy Technician [facility] Management Direct Dial:[phone]

## 2019-03-25 ENCOUNTER — Other Ambulatory Visit: Payer: Self-pay

## 2019-03-25 ENCOUNTER — Encounter: Payer: Self-pay | Admitting: Family Medicine

## 2019-03-25 ENCOUNTER — Ambulatory Visit (INDEPENDENT_AMBULATORY_CARE_PROVIDER_SITE_OTHER): Payer: Medicare HMO | Admitting: Family Medicine

## 2019-03-25 DIAGNOSIS — R110 Nausea: Secondary | ICD-10-CM

## 2019-03-25 DIAGNOSIS — J0110 Acute frontal sinusitis, unspecified: Secondary | ICD-10-CM | POA: Diagnosis not present

## 2019-03-25 MED ORDER — PROMETHAZINE HCL 12.5 MG PO TABS: 12.5000 mg | ORAL_TABLET | Freq: Three times a day (TID) | ORAL | 0 refills | Status: DC | PRN

## 2019-03-25 MED ORDER — AMOXICILLIN-POT CLAVULANATE 875-125 MG PO TABS: 1.0000 | ORAL_TABLET | Freq: Two times a day (BID) | ORAL | 0 refills | Status: AC

## 2019-03-25 NOTE — Progress Notes (Signed)
Name: Lori Potts   MRN: [id number]    DOB: June 24, 1946   Date:03/25/2019       Progress Note  Subjective  Chief Complaint  Chief Complaint  Patient presents with  . Sinusitis    headache    I connected with  [name] on 03/25/19 at 11:40 AM EDT by telephone and verified that I am speaking with the correct person using two identifiers.   I discussed the limitations, risks, security and privacy concerns of performing an evaluation and management service by telephone and the availability of in person appointments. Staff also discussed with the patient that there may be a patient responsible charge related to this service. Patient Location: Home Provider Location: Office Additional Individuals present: None  HPI  Pt presents with concern for sinusitis.  She has had sinus congestion and frontal sinus headache for about 8-9 days.  Denies fevers/chills, chest pain, shortness of breath, cough, no known contact with anyone with COVID-19.  She does note some nausea as well.  She notes a lot of stress recently.  She denies vision changes, weakness, slurred speech, confusion.  Patient Active Problem List   Diagnosis Date Noted  . 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 Microalbuminuria 01/22/2018  . Anemia 09/22/2017  . S/P knee replacement 07/28/2017  . Allergic rhinitis 05/26/2017  . Vitamin D deficiency 05/19/2017  . Chronic nonintractable headache 05/18/2017  . Special screening for malignant neoplasms, colon   . Gastroesophageal reflux disease   . Gastritis without bleeding   . 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 Insomnia 11/29/2016  . 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 Vasomotor flushing 04/19/2016  . Abdominal wall pain in right flank 03/29/2016  . Diverticulosis 02/24/2016  . Hypercholesterolemia 06/16/2015  . FHx: migraine headaches 04/13/2015  . Chronic pain 04/13/2015  . Neck pain 02/26/2015   . Status post lumbar surgery 02/26/2015  . Abnormal ECG 01/27/2015  . Angina pectoris ([place]) 01/27/2015  . 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[place]) 01/27/2015    Social History   Tobacco Use  . Smoking status: Former Smoker    Types: Cigarettes    Quit date: 2006    Years since quitting: 14.5  . Smokeless tobacco: Never Used  . Tobacco comment: quit smoking 93yr ago  Substance Use Topics  . Alcohol use: No     Current Outpatient Medications:  .  albuterol (PROVENTIL HFA;VENTOLIN HFA) 108 (90 Base) MCG/ACT inhaler, Inhale 1 puff into the lungs every 6 (six) hours as needed for wheezing or shortness of breath., Disp: , Rfl:  .  aspirin EC 81 MG tablet, Take 81 mg by mouth daily., Disp: , Rfl:  .  atorvastatin (LIPITOR) 80 MG tablet, TAKE 1 TABLET BY MOUTH EVERY DAY, Disp: 7 tablet, Rfl: 0 .  Blood Glucose Monitoring Suppl (TRUE METRIX METER) w/Device KIT, Use to check fingerstick blood sugars once a day; LON 99 months; E11.9, Disp: 1 kit, Rfl: 0 .  cetirizine (ZYRTEC) 10 MG tablet, Take 1 tablet (10 mg total) by mouth daily., Disp: 30 tablet, Rfl: 11 .  Dulaglutide (TRULICITY) 05.05MLZ/7.6BHSOPN, Inject 0.75 mg into the skin once a week., Disp: 4 pen, Rfl: 1 .  glucose blood (TRUE METRIX BLOOD GLUCOSE TEST) test strip, Check fingerstick blood sugar once a day; LON 99 months; Dx E11.29, Disp: 100 each, Rfl: 1 .  ibuprofen (ADVIL,MOTRIN) 800 MG tablet, Take 1 tablet (800 mg total)  by mouth every 8 (eight) hours as needed., Disp: 30 tablet, Rfl: 0 .  Insulin Pen Needle (BD ULTRA-FINE PEN NEEDLES) 29G X 12.7MM MISC, For use with injectable GLP-1 inhibitor, Victoza, once a day, Disp: 100 each, Rfl: 3 .  ipratropium-albuterol (DUONEB) 0.5-2.5 (3) MG/3ML SOLN, Can take 22m every 20 minutes for 2 doses as needed for wheezing, Disp: 50 mL, Rfl: 0 .  Lancets (FREESTYLE) lancets, Use to check fingerstick blood sugars once a day; LON 99 months; E11.9, Disp: 100  each, Rfl: 3 .  losartan (COZAAR) 25 MG tablet, TAKE 1 TABLET BY MOUTH EVERY DAY, Disp: 30 tablet, Rfl: 5 .  meloxicam (MOBIC) 7.5 MG tablet, Take 1 tablet (7.5 mg total) by mouth daily., Disp: 20 tablet, Rfl: 0 .  methocarbamol (ROBAXIN) 500 MG tablet, , Disp: , Rfl:  .  montelukast (SINGULAIR) 10 MG tablet, TAKE 1 TABLET BY MOUTH EVERYDAY AT BEDTIME, Disp: 90 tablet, Rfl: 3 .  oxyCODONE-acetaminophen (PERCOCET) 10-325 MG tablet, Take 1 tablet by mouth 5 (five) times daily. , Disp: , Rfl: 0 .  promethazine (PHENERGAN) 12.5 MG tablet, Take 1 tablet (12.5 mg total) by mouth every 8 (eight) hours as needed for nausea or vomiting., Disp: 10 tablet, Rfl: 0 .  sodium chloride (OCEAN) 0.65 % SOLN nasal spray, Place 1 spray into both nostrils 2 (two) times daily as needed for congestion., Disp: 60 mL, Rfl: 5 .  BREO ELLIPTA 100-25 MCG/INH AEPB, TAKE 1 PUFF BY MOUTH EVERY DAY (Patient not taking: Reported on 03/25/2019), Disp: 60 each, Rfl: 1  Allergies  Allergen Reactions  . Metformin And Related Diarrhea  . Adhesive [Tape] Rash and Other (See Comments)    Regular tape is ok, allergy is to paper tape    I personally reviewed active problem list, medication list, allergies, notes from last encounter, lab results with the patient/caregiver today.  ROS  Constitutional: Negative for fever or weight change.  Respiratory: Negative for cough and shortness of breath.   Cardiovascular: Negative for chest pain or palpitations.  Gastrointestinal: Negative for abdominal pain, no bowel changes.  Musculoskeletal: Negative for gait problem or joint swelling.  Skin: Negative for rash.  Neurological: Negative for dizziness or headache.  No other specific complaints in a complete review of systems (except as listed in HPI above).  Objective  Virtual encounter, vitals not obtained.  There is no height or weight on file to calculate BMI.  Nursing Note and Vital Signs reviewed.  Physical Exam   Pulmonary/Chest: Effort normal. No respiratory distress. Speaking in complete sentences Neurological: Pt is alert and oriented to person, place, and time. Speech is normal. Psychiatric: Patient has a normal mood and affect. behavior is normal. Judgment and thought content normal.  No results found for this or any previous visit (from the past 72 hour(s)).  Assessment & Plan  1. Acute non-recurrent frontal sinusitis - amoxicillin-clavulanate (AUGMENTIN) 875-125 MG tablet; Take 1 tablet by mouth 2 (two) times daily for 10 days.  Dispense: 20 tablet; Refill: 0  2. Nausea - promethazine (PHENERGAN) 12.5 MG tablet; Take 1 tablet (12.5 mg total) by mouth every 8 (eight) hours as needed for nausea or vomiting.  Dispense: 10 tablet; Refill: 0  -Red flags and when to present for emergency care or RTC including fever >101.45F, chest pain, shortness of breath, new/worsening/un-resolving symptoms, reviewed with patient at time of visit. Follow up and care instructions discussed and provided in AVS. - I discussed the assessment and treatment plan with the  patient. The patient was provided an opportunity to ask questions and all were answered. The patient agreed with the plan and demonstrated an understanding of the instructions.  - The patient was advised to call back or seek an in-person evaluation if the symptoms worsen or if the condition fails to improve as anticipated.  I provided 12 minutes of non-face-to-face time during this encounter.  Hubbard Hartshorn, [name]

## 2019-03-28 ENCOUNTER — Other Ambulatory Visit: Payer: Self-pay | Admitting: Family Medicine

## 2019-03-28 DIAGNOSIS — E1329 Other specified diabetes mellitus with other diabetic kidney complication: Secondary | ICD-10-CM

## 2019-03-28 DIAGNOSIS — Z794 Long term (current) use of insulin: Secondary | ICD-10-CM

## 2019-03-31 LAB — HEMOGLOBIN A1C: Hemoglobin A1C: 6.6

## 2019-04-09 DIAGNOSIS — I872 Venous insufficiency (chronic) (peripheral): Secondary | ICD-10-CM | POA: Diagnosis not present

## 2019-04-09 DIAGNOSIS — I130 Hypertensive heart and chronic kidney disease with heart failure and stage 1 through stage 4 chronic kidney disease, or unspecified chronic kidney disease: Secondary | ICD-10-CM | POA: Diagnosis not present

## 2019-04-09 DIAGNOSIS — I5033 Acute on chronic diastolic (congestive) heart failure: Secondary | ICD-10-CM | POA: Diagnosis not present

## 2019-04-09 DIAGNOSIS — L97821 Non-pressure chronic ulcer of other part of left lower leg limited to breakdown of skin: Secondary | ICD-10-CM | POA: Diagnosis not present

## 2019-04-09 DIAGNOSIS — L89322 Pressure ulcer of left buttock, stage 2: Secondary | ICD-10-CM | POA: Diagnosis not present

## 2019-04-12 ENCOUNTER — Other Ambulatory Visit: Payer: Self-pay | Admitting: Family Medicine

## 2019-04-16 ENCOUNTER — Telehealth: Payer: Self-pay | Admitting: Family Medicine

## 2019-04-16 NOTE — Telephone Encounter (Signed)
Pt called in to speak with Almyra Free. Pt says that she get her Dulaglutide (TRULICITY) 8.32 NV/9.73YO SOPN from her. Pt says that she is unable to pay for Rx at pharmacy so she pick up from the office   Please assist.   CB: [phone]

## 2019-04-17 ENCOUNTER — Ambulatory Visit (INDEPENDENT_AMBULATORY_CARE_PROVIDER_SITE_OTHER): Payer: Medicare HMO | Admitting: Pharmacist

## 2019-04-17 ENCOUNTER — Other Ambulatory Visit: Payer: Self-pay | Admitting: Nurse Practitioner

## 2019-04-17 DIAGNOSIS — Z794 Long term (current) use of insulin: Secondary | ICD-10-CM

## 2019-04-17 DIAGNOSIS — J4541 Moderate persistent asthma with (acute) exacerbation: Secondary | ICD-10-CM

## 2019-04-17 DIAGNOSIS — R809 Proteinuria, unspecified: Secondary | ICD-10-CM

## 2019-04-17 DIAGNOSIS — E0829 Diabetes mellitus due to underlying condition with other diabetic kidney complication: Secondary | ICD-10-CM

## 2019-04-17 NOTE — Telephone Encounter (Signed)
Called in a refill to [facility] for patient's Trulicity.   Left vm on patient's cell phone per DPR  Ruben Reason, PharmD Clinical Pharmacist [place] [phone]

## 2019-04-18 NOTE — Patient Instructions (Signed)
Goals Addressed            This Visit's Progress   . COMPLETED: My Victoza is too expensive  (pt-stated)       Current Barriers:  . Financial . NovoNordisk (Victoza) requires $1000 out of pocket on prescriptions for med assist program   Pharmacist Clinical Goal(s): Over the next 14 days, [name] will provide the necessary supplementary documents (proof of out of pocket prescription expenditure, proof of household income) needed for medication assistance applications to [facility] pharmacist.   Interventions: . [facility]CM pharmacist will collaborate with PCP to transition from [place] to [place] completed 02/15/02 . [facility]CM pharmacist will apply for medication assistance program for Trulicity made by [employer]. Partially completed 5/00/93: application mailed to patient  . Completed 03/22/81: Completed application turned in to [facility] pharmacist and faxed to [facility] for determination; copy of application uploaded under media tab Updated 8/26: assisted patient with reordering Trulicity, explained refill process, provided [employer] telephone number  Patient Self Care Activities:  Marland Kitchen Gather necessary documents needed to apply for medication assistance  Please see past updates related to this goal by clicking on the "Past Updates" button in the selected goal

## 2019-04-18 NOTE — Chronic Care Management (AMB) (Signed)
  Chronic Care Management   Follow Up Note   04/18/2019 Name: [name] MRN: [id number] DOB: 11-09-1945  Referred by: Arnetha Courser, MD Reason for referral : Chronic Care Management (Trulicity)   Lori Potts is a 73 y.o. year old female who is a primary care patient of Lada, Satira Anis, MD. The CCM clinical pharmacist was consulted for assistance with chronic disease management and care coordination needs. Telephone outreach today to patient following patient call to practice stating need for Trulicity refill. HIPAA identifiers verified.    Review of patient status, including review of consultants reports, relevant laboratory and other test results, and collaboration with appropriate care team members and the patient's provider was performed as part of comprehensive patient evaluation and provision of chronic care management services.   Assessment: Medication Assistance: Patient was has been approved for Trulicity made by [employer] and prescribed by Dr. Enid Derry.  Approved 12/26/2018 and expires 08/22/19. Verified refill with [employer]. Provided patient with contact information for [facility] and instructed her that she should expect a call scheduling her medication delivery within the next 5 - 7 days.     Goals Addressed            This Visit's Progress   . My Victoza is too expensive  (pt-stated)       Current Barriers:  . Financial . [employer]ord[employer] (Victoza) requires $1000 out of pocket on prescriptions for med assist program   Pharmacist Clinical Goal(s): Over the next 14 days, [name] will provide the necessary supplementary documents (proof of out of pocket prescription expenditure, proof of household income) needed for medication assistance applications to CCM pharmacist.   Interventions: . CCM pharmacist will collaborate with PCP to transition from [place] [facility] [place] completed 5/84/41 . CCM pharmacist will apply for medication  assistance program for Trulicity made by [employer]. Partially completed 03/02/77: application mailed to patient  . Completed 02/19/82: Completed application turned in to CCM pharmacist and faxed to [facility] for determination; copy of application uploaded under media tab Updated 8/26: assisted patient with reordering Trulicity, explained refill process, provided [employer] telephone number  Patient Self Care Activities:  Marland Kitchen Gather necessary documents needed to apply for medication assistance  Please see past updates related to this goal by clicking on the "Past Updates" button in the selected goal          Follow up: The patient has been provided with contact information for the chronic care management team and has been advised to call with any health related questions or concerns.  No further follow up required: has met care management goals  Ruben Reason, PharmD Clinical Pharmacist [facility] [phone]

## 2019-04-22 ENCOUNTER — Other Ambulatory Visit: Payer: Self-pay | Admitting: Emergency Medicine

## 2019-04-22 DIAGNOSIS — E7800 Pure hypercholesterolemia, unspecified: Secondary | ICD-10-CM

## 2019-04-22 DIAGNOSIS — R809 Proteinuria, unspecified: Secondary | ICD-10-CM

## 2019-04-22 DIAGNOSIS — R062 Wheezing: Secondary | ICD-10-CM

## 2019-04-22 DIAGNOSIS — E1329 Other specified diabetes mellitus with other diabetic kidney complication: Secondary | ICD-10-CM

## 2019-04-23 ENCOUNTER — Other Ambulatory Visit: Payer: Self-pay | Admitting: Emergency Medicine

## 2019-04-23 MED ORDER — LOSARTAN POTASSIUM 25 MG PO TABS: 25.0000 mg | ORAL_TABLET | Freq: Every day | ORAL | 1 refills | Status: DC

## 2019-04-23 MED ORDER — BD SWAB SINGLE USE REGULAR PADS: 1.0000 "application " | MEDICATED_PAD | Freq: Two times a day (BID) | 0 refills | Status: DC

## 2019-04-23 MED ORDER — ACCU-CHEK AVIVA PLUS VI STRP: ORAL_STRIP | 12 refills | Status: DC

## 2019-04-23 MED ORDER — MONTELUKAST SODIUM 10 MG PO TABS: ORAL_TABLET | ORAL | 3 refills | Status: DC

## 2019-04-23 MED ORDER — ACCU-CHEK AVIVA PLUS W/DEVICE KIT: 1.0000 | PACK | Freq: Once | 0 refills | Status: AC

## 2019-04-23 MED ORDER — ACCU-CHEK MULTICLIX LANCETS MISC: 12 refills | Status: DC

## 2019-04-23 MED ORDER — ATORVASTATIN CALCIUM 80 MG PO TABS: 80.0000 mg | ORAL_TABLET | Freq: Every day | ORAL | 1 refills | Status: DC

## 2019-05-08 ENCOUNTER — Ambulatory Visit (INDEPENDENT_AMBULATORY_CARE_PROVIDER_SITE_OTHER): Payer: Medicare HMO | Admitting: Emergency Medicine

## 2019-05-08 ENCOUNTER — Other Ambulatory Visit: Payer: Self-pay

## 2019-05-08 DIAGNOSIS — Z23 Encounter for immunization: Secondary | ICD-10-CM | POA: Diagnosis not present

## 2019-05-08 DIAGNOSIS — S91131A Puncture wound without foreign body of right great toe without damage to nail, initial encounter: Secondary | ICD-10-CM

## 2019-05-14 ENCOUNTER — Ambulatory Visit (INDEPENDENT_AMBULATORY_CARE_PROVIDER_SITE_OTHER): Payer: Medicare HMO | Admitting: Family Medicine

## 2019-05-14 ENCOUNTER — Encounter: Payer: Self-pay | Admitting: Family Medicine

## 2019-05-14 ENCOUNTER — Other Ambulatory Visit: Payer: Self-pay

## 2019-05-14 VITALS — BP 140/60 | HR 81 | Resp 14 | Ht 66.0 in | Wt 179.0 lb

## 2019-05-14 DIAGNOSIS — I10 Essential (primary) hypertension: Secondary | ICD-10-CM | POA: Diagnosis not present

## 2019-05-14 DIAGNOSIS — Z5181 Encounter for therapeutic drug level monitoring: Secondary | ICD-10-CM | POA: Diagnosis not present

## 2019-05-14 DIAGNOSIS — E7800 Pure hypercholesterolemia, unspecified: Secondary | ICD-10-CM

## 2019-05-14 DIAGNOSIS — J329 Chronic sinusitis, unspecified: Secondary | ICD-10-CM

## 2019-05-14 DIAGNOSIS — J4541 Moderate persistent asthma with (acute) exacerbation: Secondary | ICD-10-CM

## 2019-05-14 DIAGNOSIS — D709 Neutropenia, unspecified: Secondary | ICD-10-CM | POA: Diagnosis not present

## 2019-05-14 DIAGNOSIS — E0829 Diabetes mellitus due to underlying condition with other diabetic kidney complication: Secondary | ICD-10-CM | POA: Diagnosis not present

## 2019-05-14 DIAGNOSIS — R51 Headache: Secondary | ICD-10-CM | POA: Diagnosis not present

## 2019-05-14 DIAGNOSIS — R809 Proteinuria, unspecified: Secondary | ICD-10-CM

## 2019-05-14 DIAGNOSIS — R232 Flushing: Secondary | ICD-10-CM | POA: Diagnosis not present

## 2019-05-14 DIAGNOSIS — I700 Atherosclerosis of aorta: Secondary | ICD-10-CM | POA: Diagnosis not present

## 2019-05-14 DIAGNOSIS — G8929 Other chronic pain: Secondary | ICD-10-CM

## 2019-05-14 DIAGNOSIS — Z794 Long term (current) use of insulin: Secondary | ICD-10-CM

## 2019-05-14 DIAGNOSIS — R002 Palpitations: Secondary | ICD-10-CM

## 2019-05-14 MED ORDER — MOMETASONE FUROATE 50 MCG/ACT NA SUSP: 2.0000 | Freq: Every day | NASAL | 1 refills | Status: DC

## 2019-05-14 MED ORDER — PROMETHAZINE HCL 12.5 MG PO TABS: 12.5000 mg | ORAL_TABLET | Freq: Three times a day (TID) | ORAL | 0 refills | Status: DC | PRN

## 2019-05-14 MED ORDER — KETOROLAC TROMETHAMINE 30 MG/ML IJ SOLN: 30.0000 mg | Freq: Once | INTRAMUSCULAR | Status: DC

## 2019-05-14 MED ORDER — LEVOCETIRIZINE DIHYDROCHLORIDE 5 MG PO TABS: 5.0000 mg | ORAL_TABLET | Freq: Every evening | ORAL | 2 refills | Status: DC

## 2019-05-14 MED ORDER — DEXAMETHASONE SODIUM PHOSPHATE 10 MG/ML IJ SOLN
10.0000 mg | Freq: Once | INTRAMUSCULAR | Status: AC
  Administered 2019-05-14: 10 mg via INTRAMUSCULAR

## 2019-05-14 MED ORDER — DEXAMETHASONE SODIUM PHOSPHATE 10 MG/ML IJ SOLN: 10.0000 mg | Freq: Once | INTRAMUSCULAR | Status: DC

## 2019-05-14 MED ORDER — KETOROLAC TROMETHAMINE 60 MG/2ML IM SOLN
60.0000 mg | Freq: Once | INTRAMUSCULAR | Status: AC
  Administered 2019-05-14: 60 mg via INTRAMUSCULAR

## 2019-05-14 MED ORDER — ALBUTEROL SULFATE HFA 108 (90 BASE) MCG/ACT IN AERS: 1.0000 | INHALATION_SPRAY | Freq: Four times a day (QID) | RESPIRATORY_TRACT | 2 refills | Status: DC | PRN

## 2019-05-14 NOTE — Progress Notes (Signed)
Name: Lori Potts   MRN: [id number]    DOB: July 02, 1946   Date:05/14/2019       Progress Note  Chief Complaint  Patient presents with  . Migraine    started yesterday, tried taking tylenol.  . Hyperlipidemia  . Hypertension  . Diabetes     Subjective:   Lori Potts is a 73 y.o. female, presents to clinic for routine follow up on the conditions listed above.  Headache: Patient complains of headache.  History of recurrent headaches, her headache today is consistent with her history of headaches. HA since she was a young child after head trauma, she gets headaches a couple times a month for HA's, feels them in front or back, sometimes triggered by sinuses and sometimes by stress.   She says when she gets a bad headache she will come here and get a shot but today she has driven herself.  She does have Phenergan at home which she uses for the nausea which accompanies headaches.  She usually will take Tylenol and Phenergan and lay down.  She denies ever seeing a specialist except for ENT, and denies any past use of Imitrex or other migraine abortive or preventative medicines  Allergies - on zyrtec -she endorses a lot of sinus pain and pressure today, it does trigger her headaches a lot.  She is seeing ENT in the were trying to get a CT scan ordered but it was very costly and patient did not continue with it.  She is on Singulair for asthma/COPD/reactive airway she is not sure of her diagnosis.  She is doing saline nasal spray as needed.  Allergies seem to be more frequent, she never had any allergy testing.  Chronic lung disease (?), she states she was told she had COPD, asthma she is not sure what it is but in the fall to wintertime is when her symptoms are the worse, she is currently using 2 nebulizer treatments a day she is having sinus drainage.  She requests refill on her inhaler.  And she is using maintenance inhaler which she does have enough of right now.  She does not know the  last time she was on steroids, but she tries to avoid them because of how carefully she is managing her blood sugars and diabetes.  Diabetes Mellitus Type II: Currently managing with trulicity Pt notes excellent med compliance- also consults with CCM and pharmacist Pt has no SE from meds. Fasting CBGs typically run 120-130's,   No hypoglycemic episodes Denies: Polyuria, polydipsia, polyphagia, vision changes, or neuropathy Recent pertinent labs: Lab Results  Component Value Date   HGBA1C 6.6 03/31/2019   HGBA1C 6.3 (H) 05/09/2018   HGBA1C 6.5 (H) 01/09/2018   She is UTD on DM foot exam and due eye exam- going next month ACEI/ARB: Yes Statin: Yes  Hyperlipidemia: Current Medication Regimen:lipitor Last Lipids: 2019 -  Total cholesterol 175, HDL 61, LDL 98 - Current Diet:  Generally healthy - Denies: Chest pain, shortness of breath, myalgias. - Documented aortic atherosclerosis? Yes - Risk factors for atherosclerosis: diabetes mellitus, hypercholesterolemia and hypertension  Hypertension:  Currently managed on losartan Pt reports good med compliance and denies any SE.   No lightheadedness, hypotension, syncope. Blood pressure today is well controlled - hx of being poorly controlled or elevated in the past BP Readings from Last 3 Encounters:  05/14/19 140/60  02/11/19 (!) 163/81  12/25/18 (!) 152/74  Pt denies CP, SOB, exertional sx, LE edema, palpitation, visual disturbances  She does get HA's (as noted above)  She mentions hot flashes and palpitations intermittently, worse recently, waking her up at night sometimes, she went through menopause a long time ago and hasn't had sx for a long time.      Patient Active Problem List   Diagnosis Date Noted  . 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 Microalbuminuria 01/22/2018  . Anemia 09/22/2017  . S/P knee replacement 07/28/2017  . Allergic rhinitis 05/26/2017  . Vitamin D deficiency 05/19/2017  . Chronic  nonintractable headache 05/18/2017  . Special screening for malignant neoplasms, colon   . Gastroesophageal reflux disease   . Gastritis without bleeding   . 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 Insomnia 11/29/2016  . 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 Vasomotor flushing 04/19/2016  . Abdominal wall pain in right flank 03/29/2016  . Diverticulosis 02/24/2016  . Hypercholesterolemia 06/16/2015  . FHx: migraine headaches 04/13/2015  . Chronic pain 04/13/2015  . Neck pain 02/26/2015  . Status post lumbar surgery 02/26/2015  . Abnormal ECG 01/27/2015  . Angina pectoris ([place]) 01/27/2015  . 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 ABDOMINAL HYSTERECTOMY    . ANTERIOR CERVICAL DECOMP/DISCECTOMY FUSION N/A 02/26/2015   Procedure: ANTERIOR CERVICAL DISCECTOMY FUSION C4-5 (1 LEVEL);  Surgeon: Melina Schools, MD;  Location: [facility];  Service: Orthopedics;  Laterality: N/A;  . ANTERIOR LAT LUMBAR FUSION N/A 03/21/2013   Procedure: ANTERIOR LATERAL LUMBAR FUSION 1 LEVEL/ XLIF L3-L4 ;  Surgeon: Melina Schools, MD;  Location: [facility];  Service: Orthopedics;  Laterality: N/A;  . APPENDECTOMY    . AUGMENTATION MAMMAPLASTY Bilateral 1978  . BACK SURGERY    . BACK SURGERY     Lumbar fusion x 2  . BREAST EXCISIONAL BIOPSY Right 1970  . BREAST SURGERY  1990   Augementation  . CARDIAC CATHETERIZATION  5/12   ef 55%  . CARDIAC CATHETERIZATION  10/2010   [facility]; EF 55%  . CARDIAC CATHETERIZATION Left 02/16/2016   Procedure: Left Heart Cath and Coronary Angiography;  Surgeon: Yolonda Kida, MD;  Location: [facility];  Service: Cardiovascular;  Laterality: Left;  . COLONOSCOPY    . COLONOSCOPY WITH PROPOFOL N/A 04/10/2017   Procedure: COLONOSCOPY WITH PROPOFOL;  Surgeon: Lucilla Lame, MD;  Location: [facility];  Service: Gastroenterology;   Laterality: N/A;  Diabetic - insulin  . ESOPHAGOGASTRODUODENOSCOPY    . [place]   growth removed from left kidney   . LEFT HEART CATH AND CORONARY ANGIOGRAPHY Left 09/10/2018   Procedure: LEFT HEART CATH AND CORONARY ANGIOGRAPHY;  Surgeon: Yolonda Kida, MD;  Location: [facility];  Service: Cardiovascular;  Laterality: Left;  . pain stimulator    . POSTERIOR CERVICAL FUSION/FORAMINOTOMY Right 03/21/2013   Procedure: POSTERIOR L2-3 RIGHT FORAMINOTOMY;  Surgeon: Melina Schools, MD;  Location: [facility];  Service: Orthopedics;  Laterality: Right;  . REVISION OF SCAR TISSUE RECTUS MUSCLE    . SMALL BOWEL REPAIR    . SPINAL CORD STIMULATOR BATTERY EXCHANGE N/A 10/17/2012   Procedure: SPINAL CORD STIMULATOR BATTERY REMOVAL;  Surgeon: Melina Schools, MD;  Location: [facility];  Service: Orthopedics;  Laterality: N/A;  . SPINAL CORD STIMULATOR BATTERY EXCHANGE N/A 07/22/2015   Procedure: REIMPLANTATION OF SPINAL CORD STIMULATOR BATTERY ;  Surgeon: Melina Schools, MD;  Location: [place];  Service: Orthopedics;  Laterality: N/A;  .  TOTAL KNEE ARTHROPLASTY Left   . TOTAL KNEE ARTHROPLASTY Right 07/28/2017   Procedure: RIGHT TOTAL KNEE ARTHROPLASTY;  Surgeon: Sydnee Cabal, MD;  Location: [facility];  Service: Orthopedics;  Laterality: Right;    Family History  Problem Relation Age of Onset  . Heart attack Mother   . Sarcoidosis Sister     Social History   Socioeconomic History  . Marital status: Married    Spouse name: Gwyndolyn Saxon  . Number of children: 3  . Years of education: Not on file  . Highest education level: Associate degree: occupational, Hotel manager, or vocational program  Occupational History  . Not on file  Social Needs  . Financial resource strain: Not hard at all  . Food insecurity    Worry: Never true    Inability: Never true  . Transportation needs    Medical: No    Non-medical: No  Tobacco Use  . Smoking status: Former Smoker    Types: Cigarettes    Quit date:  2006    Years since quitting: 14.7  . Smokeless tobacco: Never Used  . Tobacco comment: quit smoking 40yr ago  Substance and Sexual Activity  . Alcohol use: No  . Drug use: No  . Sexual activity: Yes    Partners: Male    Birth control/protection: Surgical  Lifestyle  . Physical activity    Days per week: 0 days    Minutes per session: 0 min  . Stress: Not at all  Relationships  . Social connections    Talks on phone: More than three times a week    Gets together: Twice a week    Attends religious service: More than 4 times per year    Active member of club or organization: Yes    Attends meetings of clubs or organizations: 1 to 4 times per year    Relationship status: Married  . Intimate partner violence    Fear of current or ex partner: No    Emotionally abused: No    Physically abused: No    Forced sexual activity: No  Other Topics Concern  . Not on file  Social History Narrative   ** Merged History Encounter **         Current Outpatient Medications:  .  albuterol (PROVENTIL HFA;VENTOLIN HFA) 108 (90 Base) MCG/ACT inhaler, Inhale 1 puff into the lungs every 6 (six) hours as needed for wheezing or shortness of breath., Disp: , Rfl:  .  Alcohol Swabs (B-D SINGLE USE SWABS REGULAR) PADS, 1 application by Does not apply route 2 (two) times daily., Disp: 90 each, Rfl: 0 .  aspirin EC 81 MG tablet, Take 81 mg by mouth daily., Disp: , Rfl:  .  atorvastatin (LIPITOR) 80 MG tablet, Take 1 tablet (80 mg total) by mouth daily., Disp: 90 tablet, Rfl: 1 .  Blood Glucose Monitoring Suppl (TRUE METRIX METER) w/Device KIT, Use to check fingerstick blood sugars once a day; LON 99 months; E11.9, Disp: 1 kit, Rfl: 0 .  BREO ELLIPTA 100-25 MCG/INH AEPB, INHALE 1 PUFF BY MOUTH EVERY DAY, Disp: 60 each, Rfl: 1 .  cetirizine (ZYRTEC) 10 MG tablet, Take 1 tablet (10 mg total) by mouth daily., Disp: 30 tablet, Rfl: 11 .  Dulaglutide (TRULICITY) 09.21MJH/4.1DESOPN, Inject 0.75 mg into the skin  once a week., Disp: 4 pen, Rfl: 1 .  glucose blood (ACCU-CHEK AVIVA PLUS) test strip, Use as instructed, Disp: 100 each, Rfl: 12 .  Insulin Pen Needle (BD ULTRA-FINE  PEN NEEDLES) 29G X 12.7MM MISC, For use with injectable GLP-1 inhibitor, Victoza, once a day, Disp: 100 each, Rfl: 3 .  ipratropium-albuterol (DUONEB) 0.5-2.5 (3) MG/3ML SOLN, Can take 41m every 20 minutes for 2 doses as needed for wheezing, Disp: 50 mL, Rfl: 0 .  Lancets (ACCU-CHEK MULTICLIX) lancets, Use as instructed, Disp: 100 each, Rfl: 12 .  losartan (COZAAR) 25 MG tablet, Take 1 tablet (25 mg total) by mouth daily., Disp: 90 tablet, Rfl: 1 .  meloxicam (MOBIC) 7.5 MG tablet, Take 1 tablet (7.5 mg total) by mouth daily., Disp: 20 tablet, Rfl: 0 .  methocarbamol (ROBAXIN) 500 MG tablet, , Disp: , Rfl:  .  montelukast (SINGULAIR) 10 MG tablet, TAKE 1 TABLET BY MOUTH EVERYDAY AT BEDTIME, Disp: 90 tablet, Rfl: 3 .  oxyCODONE-acetaminophen (PERCOCET) 10-325 MG tablet, Take 1 tablet by mouth 5 (five) times daily. , Disp: , Rfl: 0 .  promethazine (PHENERGAN) 12.5 MG tablet, Take 1 tablet (12.5 mg total) by mouth every 8 (eight) hours as needed for nausea or vomiting., Disp: 10 tablet, Rfl: 0 .  sodium chloride (OCEAN) 0.65 % SOLN nasal spray, Place 1 spray into both nostrils 2 (two) times daily as needed for congestion., Disp: 60 mL, Rfl: 5 .  TRUE METRIX BLOOD GLUCOSE TEST test strip, TEST BLOOD SUGAR ONE TIME DAILY, Disp: 100 strip, Rfl: 2 .  ibuprofen (ADVIL,MOTRIN) 800 MG tablet, Take 1 tablet (800 mg total) by mouth every 8 (eight) hours as needed. (Patient not taking: Reported on 05/14/2019), Disp: 30 tablet, Rfl: 0  Allergies  Allergen Reactions  . Metformin And Related Diarrhea  . Adhesive [Tape] Rash and Other (See Comments)    Regular tape is ok, allergy is to paper tape    I personally reviewed active problem list, medication list, allergies, family history, social history, health maintenance, notes from last encounter,  lab results with the patient/caregiver today.  Review of Systems  Constitutional: Negative.  Negative for chills, diaphoresis, fatigue and fever.  HENT: Negative.   Eyes: Negative.   Respiratory: Negative.  Negative for cough and chest tightness.   Cardiovascular: Positive for palpitations. Negative for chest pain and leg swelling.  Gastrointestinal: Negative.   Endocrine: Negative.   Genitourinary: Negative.   Musculoskeletal: Negative.   Skin: Negative.   Allergic/Immunologic: Positive for environmental allergies. Negative for food allergies and immunocompromised state.  Neurological: Positive for headaches. Negative for dizziness, tremors, seizures, syncope, facial asymmetry, speech difficulty, weakness, light-headedness and numbness.  Hematological: Negative.   Psychiatric/Behavioral: Positive for sleep disturbance. Negative for dysphoric mood.  All other systems reviewed and are negative.    Objective:    Vitals:   05/14/19 1320  BP: 140/60  Pulse: 81  Resp: 14  SpO2: 97%  Weight: 179 lb (81.2 kg)  Height: '5\' 6"'$  (1.676 m)    Body mass index is 28.89 kg/m.  Physical Exam Vitals signs and nursing note reviewed.  Constitutional:      General: She is not in acute distress.    Appearance: Normal appearance. She is well-developed. She is not toxic-appearing or diaphoretic.     Comments: Well appearing, but slightly uncomfortable appearing female, NAD  HENT:     Head: Normocephalic and atraumatic.     Right Ear: Tympanic membrane, ear canal and external ear normal.     Left Ear: Tympanic membrane, ear canal and external ear normal.     Nose: Mucosal edema and congestion present. No rhinorrhea.     Right Turbinates:  Enlarged.     Left Turbinates: Enlarged.     Right Sinus: Maxillary sinus tenderness and frontal sinus tenderness present.     Left Sinus: Maxillary sinus tenderness and frontal sinus tenderness present.     Mouth/Throat:     Mouth: Mucous membranes are  moist.     Pharynx: Oropharynx is clear. Uvula midline. No oropharyngeal exudate or posterior oropharyngeal erythema.  Eyes:     General: Lids are normal. No scleral icterus.       Right eye: No discharge.        Left eye: No discharge.     Conjunctiva/sclera: Conjunctivae normal.     Pupils: Pupils are equal, round, and reactive to light.  Neck:     Musculoskeletal: Normal range of motion and neck supple.     Thyroid: No thyroid mass, thyromegaly or thyroid tenderness.     Trachea: Phonation normal. No tracheal deviation.  Cardiovascular:     Rate and Rhythm: Normal rate and regular rhythm.  No extrasystoles are present.    Chest Wall: PMI is not displaced.     Pulses: Normal pulses.          Radial pulses are 2+ on the right side and 2+ on the left side.       Posterior tibial pulses are 2+ on the right side and 2+ on the left side.     Heart sounds: Normal heart sounds. No murmur. No friction rub. No gallop.   Pulmonary:     Effort: Pulmonary effort is normal. No respiratory distress.     Breath sounds: Normal breath sounds. No stridor. No wheezing, rhonchi or rales.  Chest:     Chest wall: No tenderness.  Abdominal:     General: Bowel sounds are normal. There is no distension.     Palpations: Abdomen is soft.     Tenderness: There is no abdominal tenderness. There is no guarding or rebound.  Musculoskeletal: Normal range of motion.        General: No deformity.     Right lower leg: No edema.     Left lower leg: No edema.  Feet:     Comments: DM foot exam completed today see chart Lymphadenopathy:     Cervical: No cervical adenopathy.  Skin:    General: Skin is warm and dry.     Capillary Refill: Capillary refill takes less than 2 seconds.     Coloration: Skin is not pale.     Findings: No rash.  Neurological:     Mental Status: She is alert and oriented to person, place, and time.     Motor: No abnormal muscle tone.     Gait: Gait normal.  Psychiatric:        Speech:  Speech normal.        Behavior: Behavior normal.      Recent Results (from the past 2160 hour(s))  Hemoglobin A1c     Status: None   Collection Time: 03/31/19 12:00 AM  Result Value Ref Range   Hemoglobin A1C 6.6     Diabetic Foot Exam: Diabetic Foot Exam - Simple   Simple Foot Form Diabetic Foot exam was performed with the following findings: Yes 05/14/2019  1:59 PM  Visual Inspection Sensation Testing Pulse Check Comments      PHQ2/9: Depression screen [facility] 2/9 05/14/2019 05/14/2019 03/25/2019 12/25/2018 11/19/2018  Decreased Interest 0 0 0 0 0  Down, Depressed, Hopeless 0 0 0 0 0  PHQ - 2  Score 0 0 0 0 0  Altered sleeping 0 0 0 0 0  Tired, decreased energy 0 0 0 0 0  Change in appetite 0 0 0 0 0  Feeling bad or failure about yourself  0 0 0 0 0  Trouble concentrating 0 0 0 0 0  Moving slowly or fidgety/restless 0 0 0 0 0  Suicidal thoughts 0 0 0 0 0  PHQ-9 Score 0 0 0 0 0  Difficult doing work/chores - Not difficult at all Not difficult at all Not difficult at all Not difficult at all  Some recent data might be hidden    phq 9 is negative Reviewed today  Fall Risk: Fall Risk  05/14/2019 03/25/2019 12/25/2018 11/19/2018 10/30/2018  Falls in the past year? 1 0 0 0 0  Number falls in past yr: 0 0 0 0 0  Injury with Fall? 0 0 0 0 0  Risk for fall due to : - - - - -  Follow up - Falls evaluation completed - - -      Functional Status Survey: Does the patient have difficulty seeing, even when wearing glasses/contacts?: No Does the patient have difficulty concentrating, remembering, or making decisions?: No Does the patient have difficulty walking or climbing stairs?: No Does the patient have difficulty dressing or bathing?: No Does the patient have difficulty doing errands alone such as visiting a doctor's office or shopping?: No    Assessment & Plan:    1. Chronic nonintractable headache, unspecified headache type Episodic a few times a month - no migraine meds "gets  shots" in clinic for HA, she drove herself here today, can do steroid and toradol for HA and when she gets home oral phenergan No focal neurological deficit Suspect headaches are triggered by some muscle tension in her neck and some triggered by sinuses she is tender in her face today but does not seem to need antibiotics for acute bacterial sinusitis We will try propranolol see if it decreases headache frequency - promethazine (PHENERGAN) 12.5 MG tablet; Take 1 tablet (12.5 mg total) by mouth every 8 (eight) hours as needed for nausea or vomiting.  Dispense: 10 tablet; Refill: 0 - dexamethasone (DECADRON) injection 10 mg - TSH - ketorolac (TORADOL) injection 60 mg - propranolol ER (INDERAL LA) 60 MG 24 hr capsule; Take 1 capsule (60 mg total) by mouth daily.  Dispense: 30 capsule; Refill: 1  2. Essential hypertension History poorly controlled blood pressure, looks okay today we will continue with losartan and add propranolol - COMPLETE METABOLIC PANEL WITH GFR - propranolol ER (INDERAL LA) 60 MG 24 hr capsule; Take 1 capsule (60 mg total) by mouth daily.  Dispense: 30 capsule; Refill: 1  3. 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 Neutropenia, unspecified type ([place]) History of, checking labs - CBC with Differential/Platelet  5. 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HCC) On statin and aspirin - COMPLETE METABOLIC PANEL WITH GFR - Lipid panel  6. Hypercholesterolemia Tolerating statin and aspirin - COMPLETE METABOLIC PANEL WITH GFR - Lipid panel - TSH  7. Moderate persistent asthma with exacerbation Some of her symptoms slightly more frequent over the last week and a half with allergy symptoms worsening, steroid shot today for headache will hopefully help a little bit with her lungs, change in allergy medications,  encouraged to use her inhaler  as prescribed follow-up if respiratory symptoms are worse or do not improve over the next week - albuterol (VENTOLIN HFA) 108 (90 Base) MCG/ACT inhaler; Inhale 1 puff into the lungs every 6 (six) hours as needed for wheezing or shortness of breath.  Dispense: 18 g; Refill: 2  8. Rhinosinusitis Acute on chronic, seeing ENT but cannot afford a CT scan of her sinuses, will change her antihistamine encouraged daily Flonase or Nasonex, continue Singulair follow-up with ENT - levocetirizine (XYZAL) 5 MG tablet; Take 1 tablet (5 mg total) by mouth every evening.  Dispense: 30 tablet; Refill: 2 - mometasone (NASONEX) 50 MCG/ACT nasal spray; Place 2 sprays into the nose daily.  Dispense: 17 g; Refill: 1  9. Hot flashes Would be unusual to have worsening symptoms from menopause but will check thyroid and trial of propranolol - TSH - propranolol ER (INDERAL LA) 60 MG 24 hr capsule; Take 1 capsule (60 mg total) by mouth daily.  Dispense: 30 capsule; Refill: 1  10. Palpitations Cardiac exam unremarkable today, checking basic labs and thyroid - COMPLETE METABOLIC PANEL WITH GFR - CBC with Differential/Platelet - TSH - propranolol ER (INDERAL LA) 60 MG 24 hr capsule; Take 1 capsule (60 mg total) by mouth daily.  Dispense: 30 capsule; Refill: 1  11. Medication monitoring encounter - COMPLETE METABOLIC PANEL WITH GFR - CBC with Differential/Platelet - Lipid panel - Microalbumin, urine - TSH - TSH  Greater than 50% of this visit was spent in direct face-to-face counseling, obtaining history and physical, discussing and educating pt on treatment plan.  Total time of this visit was 45 min.  Remainder of time involved but was not limited to reviewing chart (recent and pertinent OV notes and labs), documentation in EMR, and coordinating care and treatment plan. Addressed multiple acute and multiple chronic conditions in pt who is new to me, who has not had routine OV for about a  year.  Return in about 4 months (around 09/13/2019) for Routine follow-up HLD, HTN, asthma, DM, f/up sooner if headaches worsen.   Delsa Grana, PA-C 05/14/19 1:35 PM

## 2019-05-14 NOTE — Patient Instructions (Addendum)
For allergies -try switching around the medications and start with Xyzal once a day, continue your Singulair and start a steroid nasal spray, 2 sprays in each nostril once a day for at least the next 2 weeks.  If allergies are still bothersome you are having sinus pain or pressure increase antihistamine to twice a day.  For headaches we will try to improve the sinus allergies and use a heating pad to your neck to help decrease muscle tension that triggers head aches.  I would continue to use Tylenol or Excedrin Migraine over-the-counter very sparingly and as needed for headaches if you continue to have only a few a month.  You can continue to use your Phenergan with these I did put in a refill.  Please follow-up so we can adjust the headache treatment if your headaches do not improve or if they become more frequent or more severe.  We will call you with your lab work from today checking your kidney function, liver function cholesterol etc.

## 2019-05-15 LAB — LIPID PANEL
Cholesterol: 210 mg/dL — ABNORMAL HIGH (ref <200)
HDL: 57 mg/dL (ref >=50)
LDL Cholesterol (Calc): 128 mg/dL (calc) — ABNORMAL HIGH
Non-HDL Cholesterol (Calc): 153 mg/dL (calc) — ABNORMAL HIGH (ref <130)
Total CHOL/HDL Ratio: 3.7 (calc) (ref <5.0)
Triglycerides: 131 mg/dL (ref <150)

## 2019-05-15 LAB — COMPLETE METABOLIC PANEL WITH GFR
AG Ratio: 1.6 (calc) (ref 1.0–2.5)
ALT: 21 U/L (ref 6–29)
AST: 23 U/L (ref 10–35)
Albumin: 4.2 g/dL (ref 3.6–5.1)
Alkaline phosphatase (APISO): 90 U/L (ref 37–153)
BUN: 17 mg/dL (ref 7–25)
CO2: 26 mmol/L (ref 20–32)
Calcium: 9.2 mg/dL (ref 8.6–10.4)
Chloride: 108 mmol/L (ref 98–110)
Creat: 0.64 mg/dL (ref 0.60–0.93)
GFR, Est African American: 103 mL/min/{1.73_m2} (ref >=60)
GFR, Est Non African American: 89 mL/min/{1.73_m2} (ref >=60)
Globulin: 2.6 g/dL (calc) (ref 1.9–3.7)
Glucose, Bld: 96 mg/dL (ref 65–99)
Potassium: 4 mmol/L (ref 3.5–5.3)
Sodium: 142 mmol/L (ref 135–146)
Total Bilirubin: 0.3 mg/dL (ref 0.2–1.2)
Total Protein: 6.8 g/dL (ref 6.1–8.1)

## 2019-05-15 LAB — CBC WITH DIFFERENTIAL/PLATELET
Absolute Monocytes: 303 cells/uL (ref 200–950)
Basophils Absolute: 52 cells/uL (ref 0–200)
Basophils Relative: 1.4 %
Eosinophils Absolute: 252 cells/uL (ref 15–500)
Eosinophils Relative: 6.8 %
HCT: 35.8 % (ref 35.0–45.0)
Hemoglobin: 12 g/dL (ref 11.7–15.5)
Lymphs Abs: 1310 cells/uL (ref 850–3900)
MCH: 26.7 pg — ABNORMAL LOW (ref 27.0–33.0)
MCHC: 33.5 g/dL (ref 32.0–36.0)
MCV: 79.7 fL — ABNORMAL LOW (ref 80.0–100.0)
MPV: 10.9 fL (ref 7.5–12.5)
Monocytes Relative: 8.2 %
Neutro Abs: 1783 cells/uL (ref 1500–7800)
Neutrophils Relative %: 48.2 %
Platelets: 380 10*3/uL (ref 140–400)
RBC: 4.49 10*6/uL (ref 3.80–5.10)
RDW: 14.1 % (ref 11.0–15.0)
Total Lymphocyte: 35.4 %
WBC: 3.7 10*3/uL — ABNORMAL LOW (ref 3.8–10.8)

## 2019-05-15 LAB — TSH: TSH: 0.91 mIU/L (ref 0.40–4.50)

## 2019-05-15 LAB — MICROALBUMIN, URINE: Microalb, Ur: 5.2 mg/dL

## 2019-05-15 MED ORDER — PROPRANOLOL HCL ER 60 MG PO CP24: 60.0000 mg | ORAL_CAPSULE | Freq: Every day | ORAL | 1 refills | Status: DC

## 2019-05-15 MED ORDER — ROSUVASTATIN CALCIUM 40 MG PO TABS: 40.0000 mg | ORAL_TABLET | Freq: Every day | ORAL | 3 refills | Status: DC

## 2019-05-15 NOTE — Addendum Note (Signed)
Addended by: Delsa Grana on: 05/15/2019 05:51 PM   Modules accepted: Orders

## 2019-06-07 ENCOUNTER — Other Ambulatory Visit: Payer: Self-pay | Admitting: Family Medicine

## 2019-06-07 DIAGNOSIS — G8929 Other chronic pain: Secondary | ICD-10-CM

## 2019-06-07 DIAGNOSIS — R002 Palpitations: Secondary | ICD-10-CM

## 2019-06-07 DIAGNOSIS — R232 Flushing: Secondary | ICD-10-CM

## 2019-06-07 DIAGNOSIS — I10 Essential (primary) hypertension: Secondary | ICD-10-CM

## 2019-06-12 ENCOUNTER — Encounter: Payer: Self-pay | Admitting: Family Medicine

## 2019-06-12 ENCOUNTER — Other Ambulatory Visit: Payer: Self-pay

## 2019-06-12 ENCOUNTER — Ambulatory Visit (INDEPENDENT_AMBULATORY_CARE_PROVIDER_SITE_OTHER): Payer: Medicare HMO | Admitting: Family Medicine

## 2019-06-12 DIAGNOSIS — E1129 Type 2 diabetes mellitus with other diabetic kidney complication: Secondary | ICD-10-CM

## 2019-06-12 DIAGNOSIS — M5441 Lumbago with sciatica, right side: Secondary | ICD-10-CM | POA: Diagnosis not present

## 2019-06-12 DIAGNOSIS — J4541 Moderate persistent asthma with (acute) exacerbation: Secondary | ICD-10-CM | POA: Diagnosis not present

## 2019-06-12 DIAGNOSIS — J0100 Acute maxillary sinusitis, unspecified: Secondary | ICD-10-CM | POA: Diagnosis not present

## 2019-06-12 DIAGNOSIS — Z20822 Contact with and (suspected) exposure to covid-19: Secondary | ICD-10-CM

## 2019-06-12 DIAGNOSIS — R809 Proteinuria, unspecified: Secondary | ICD-10-CM

## 2019-06-12 DIAGNOSIS — G8929 Other chronic pain: Secondary | ICD-10-CM

## 2019-06-12 MED ORDER — AMOXICILLIN-POT CLAVULANATE 875-125 MG PO TABS: 1.0000 | ORAL_TABLET | Freq: Two times a day (BID) | ORAL | 0 refills | Status: AC

## 2019-06-12 MED ORDER — PROMETHAZINE-DM 6.25-15 MG/5ML PO SYRP: 5.0000 mL | ORAL_SOLUTION | Freq: Four times a day (QID) | ORAL | 0 refills | Status: DC | PRN

## 2019-06-12 MED ORDER — IPRATROPIUM-ALBUTEROL 0.5-2.5 (3) MG/3ML IN SOLN: RESPIRATORY_TRACT | 0 refills | Status: DC

## 2019-06-12 MED ORDER — BUDESONIDE-FORMOTEROL FUMARATE 160-4.5 MCG/ACT IN AERO: 2.0000 | INHALATION_SPRAY | Freq: Two times a day (BID) | RESPIRATORY_TRACT | 3 refills | Status: DC

## 2019-06-12 MED ORDER — ALBUTEROL SULFATE HFA 108 (90 BASE) MCG/ACT IN AERS: 1.0000 | INHALATION_SPRAY | Freq: Four times a day (QID) | RESPIRATORY_TRACT | 2 refills | Status: DC | PRN

## 2019-06-12 NOTE — Progress Notes (Signed)
Name: Lori Potts   MRN: [id number]    DOB: 12/02/1945   Date:06/12/2019       Progress Note  Subjective  Chief Complaint  Chief Complaint  Patient presents with  . Shortness of Breath    cough,congested for 2 weeks    I connected with  [name] on 06/12/19 at  7:40 AM EDT by telephone and verified that I am speaking with the correct person using two identifiers.   I discussed the limitations, risks, security and privacy concerns of performing an evaluation and management service by telephone and the availability of in person appointments. Staff also discussed with the patient that there may be a patient responsible charge related to this service. Patient Location: Home Provider Location: Home Office Additional Individuals present: None  HPI  Pt presents with concern for cough, sinus congestion that started about 2 weeks ago.  She endorses rhinorrhea, some shortness of breath, tenderness on the bilateral maxillary sinuses.  She denies wheezing, fevers/chills, fatigue. She has similar illness in the fall the last several years in a row.  She denies any known contact with possible COVID-19 patient.  She is using her albuterol inhaler PRN; has not been able to afford Breo - we will send in Symbicort again as replacement.  She notes tessalon perles do not work for her cough and requests a cough syrup; advised that promethazine DM can cause drowsiness and decreased respiratory drive, she verbalizes understanding and states has done well on this in the past.  Pt has DM - though last A1C was very well controlled at 6.6%, we will avoid PO steroids if possible at this time to avoid hyperglycemia.  Discussed sick day care.  She does also note that her back pain has flared recently with her cough.  She has not been taking her robaxin because it made her too drowsy/weak.  We will switch to tizanidine to see if this helps her discomfort.  She does not want to try any additional  interventions at this time.  Patient Active Problem List   Diagnosis Date Noted  . 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 Microalbuminuria 01/22/2018  . Anemia 09/22/2017  . S/P knee replacement 07/28/2017  . Allergic rhinitis 05/26/2017  . Vitamin D deficiency 05/19/2017  . Chronic nonintractable headache 05/18/2017  . Special screening for malignant neoplasms, colon   . Gastroesophageal reflux disease   . Gastritis without bleeding   . 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 Insomnia 11/29/2016  . 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 Vasomotor flushing 04/19/2016  . Abdominal wall pain in right flank 03/29/2016  . Diverticulosis 02/24/2016  . Hypercholesterolemia 06/16/2015  . FHx: migraine headaches 04/13/2015  . Chronic pain 04/13/2015  . Neck pain 02/26/2015  . Status post lumbar surgery 02/26/2015  . Abnormal ECG 01/27/2015  . Angina pectoris ([place]) 01/27/2015  . 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[place]) 01/27/2015    Social History   Tobacco Use  . Smoking status: Former Smoker    Types: Cigarettes    Quit date: 2006    Years since quitting: 14.8  . Smokeless tobacco: Never Used  . Tobacco comment: quit smoking 85yr ago  Substance Use Topics  . Alcohol use: No     Current Outpatient Medications:  .  albuterol (VENTOLIN HFA) 108 (90 Base) MCG/ACT inhaler, Inhale 1 puff into the lungs every 6 (six) hours as needed for wheezing or shortness  of breath., Disp: 18 g, Rfl: 2 .  Alcohol Swabs (B-D SINGLE USE SWABS REGULAR) PADS, 1 application by Does not apply route 2 (two) times daily., Disp: 90 each, Rfl: 0 .  aspirin EC 81 MG tablet, Take 81 mg by mouth daily., Disp: , Rfl:  .  Blood Glucose Monitoring Suppl (TRUE METRIX METER) w/Device KIT, Use to check fingerstick blood sugars once a day; LON 99 months; E11.9, Disp: 1 kit, Rfl: 0 .  BREO ELLIPTA 100-25 MCG/INH AEPB, INHALE  1 PUFF BY MOUTH EVERY DAY, Disp: 60 each, Rfl: 1 .  Dulaglutide (TRULICITY) 6.46 OE/3.2ZY SOPN, Inject 0.75 mg into the skin once a week., Disp: 4 pen, Rfl: 1 .  glucose blood (ACCU-CHEK AVIVA PLUS) test strip, Use as instructed, Disp: 100 each, Rfl: 12 .  Insulin Pen Needle (BD ULTRA-FINE PEN NEEDLES) 29G X 12.7MM MISC, For use with injectable GLP-1 inhibitor, Victoza, once a day, Disp: 100 each, Rfl: 3 .  ipratropium-albuterol (DUONEB) 0.5-2.5 (3) MG/3ML SOLN, Can take 57m every 20 minutes for 2 doses as needed for wheezing, Disp: 50 mL, Rfl: 0 .  Lancets (ACCU-CHEK MULTICLIX) lancets, Use as instructed, Disp: 100 each, Rfl: 12 .  levocetirizine (XYZAL) 5 MG tablet, Take 1 tablet (5 mg total) by mouth every evening., Disp: 30 tablet, Rfl: 2 .  losartan (COZAAR) 25 MG tablet, Take 1 tablet (25 mg total) by mouth daily., Disp: 90 tablet, Rfl: 1 .  meloxicam (MOBIC) 7.5 MG tablet, Take 1 tablet (7.5 mg total) by mouth daily., Disp: 20 tablet, Rfl: 0 .  methocarbamol (ROBAXIN) 500 MG tablet, , Disp: , Rfl:  .  mometasone (NASONEX) 50 MCG/ACT nasal spray, Place 2 sprays into the nose daily., Disp: 17 g, Rfl: 1 .  montelukast (SINGULAIR) 10 MG tablet, TAKE 1 TABLET BY MOUTH EVERYDAY AT BEDTIME, Disp: 90 tablet, Rfl: 3 .  oxyCODONE-acetaminophen (PERCOCET) 10-325 MG tablet, Take 1 tablet by mouth 5 (five) times daily. , Disp: , Rfl: 0 .  promethazine (PHENERGAN) 12.5 MG tablet, Take 1 tablet (12.5 mg total) by mouth every 8 (eight) hours as needed for nausea or vomiting., Disp: 10 tablet, Rfl: 0 .  propranolol ER (INDERAL LA) 60 MG 24 hr capsule, TAKE 1 CAPSULE BY MOUTH EVERY DAY, Disp: 30 capsule, Rfl: 1 .  rosuvastatin (CRESTOR) 40 MG tablet, Take 1 tablet (40 mg total) by mouth daily., Disp: 90 tablet, Rfl: 3 .  sodium chloride (OCEAN) 0.65 % SOLN nasal spray, Place 1 spray into both nostrils 2 (two) times daily as needed for congestion., Disp: 60 mL, Rfl: 5 .  TRUE METRIX BLOOD GLUCOSE TEST test  strip, TEST BLOOD SUGAR ONE TIME DAILY, Disp: 100 strip, Rfl: 2 .  ibuprofen (ADVIL,MOTRIN) 800 MG tablet, Take 1 tablet (800 mg total) by mouth every 8 (eight) hours as needed. (Patient not taking: Reported on 05/14/2019), Disp: 30 tablet, Rfl: 0  Allergies  Allergen Reactions  . Metformin And Related Diarrhea  . Adhesive [Tape] Rash and Other (See Comments)    Regular tape is ok, allergy is to paper tape    I personally reviewed active problem list, medication list, allergies, notes from last encounter with the patient/caregiver today.  ROS  Ten systems reviewed and is negative except as mentioned in HPI  Objective  Virtual encounter, vitals not obtained.  There is no height or weight on file to calculate BMI.  Nursing Note and Vital Signs reviewed.  Physical Exam  Pulmonary/Chest: Effort normal. No respiratory  distress. Speaking in complete sentences Neurological: Pt is alert and oriented to person, place, and time. Coordination, speech and gait are normal.  Psychiatric: Patient has a normal mood and affect. behavior is normal. Judgment and thought content normal.   No results found for this or any previous visit (from the past 72 hour(s)).  Assessment & Plan  1. Moderate persistent asthma with exacerbation - albuterol (VENTOLIN HFA) 108 (90 Base) MCG/ACT inhaler; Inhale 1 puff into the lungs every 6 (six) hours as needed for wheezing or shortness of breath.  Dispense: 18 g; Refill: 2 - ipratropium-albuterol (DUONEB) 0.5-2.5 (3) MG/3ML SOLN; Can take 58m every 20 minutes for 2 doses as needed for wheezing  Dispense: 50 mL; Refill: 0 - budesonide-formoterol (SYMBICORT) 160-4.5 MCG/ACT inhaler; Inhale 2 puffs into the lungs 2 (two) times daily.  Dispense: 1 Inhaler; Refill: 3 - promethazine-dextromethorphan (PROMETHAZINE-DM) 6.25-15 MG/5ML syrup; Take 5 mLs by mouth 4 (four) times daily as needed.  Dispense: 118 mL; Refill: 0  2. Acute non-recurrent maxillary sinusitis -  amoxicillin-clavulanate (AUGMENTIN) 875-125 MG tablet; Take 1 tablet by mouth 2 (two) times daily for 10 days.  Dispense: 20 tablet; Refill: 0 - Novel Coronavirus, NAA ([facility]) - promethazine-dextromethorphan (PROMETHAZINE-DM) 6.25-15 MG/5ML syrup; Take 5 mLs by mouth 4 (four) times daily as needed.  Dispense: 118 mL; Refill: 0  3. Diabetes mellitus with microalbuminuria ([place] - Will avoid systemic corticosteroids for the time being.  Monitor BG's and for signs and symptoms of hypo/hyperglycemia while feeling ill.   - Sick day care is discussed in detail.  4. Chronic right-sided low back pain with right-sided sciatica - Gentle stretching; declines additional management today.  May need to consider oral corticosteroids if flare becomes more severe, but will avoid at this time due to her DM.   -Red flags and when to present for emergency care or RTC including fever >101.24F, chest pain, shortness of breath, new/worsening/un-resolving symptoms, reviewed with patient at time of visit. Follow up and care instructions discussed and provided in AVS. - I discussed the assessment and treatment plan with the patient. The patient was provided an opportunity to ask questions and all were answered. The patient agreed with the plan and demonstrated an understanding of the instructions.  - The patient was advised to call back or seek an in-person evaluation if the symptoms worsen or if the condition fails to improve as anticipated.  I provided 16 minutes of non-face-to-face time during this encounter.  EHubbard Hartshorn [name]

## 2019-06-14 LAB — NOVEL CORONAVIRUS, NAA: SARS-CoV-2, NAA: NOT DETECTED

## 2019-06-17 ENCOUNTER — Other Ambulatory Visit: Payer: Self-pay | Admitting: Family Medicine

## 2019-06-17 NOTE — Progress Notes (Signed)
Patient called in asking for stronger medication for her asthma exacerbation - would like to try neb treatment.  I sent in duoneb - make sure she has picked this up at the pharmacy - can use this in replacement of the albuterol inhaler.

## 2019-06-30 ENCOUNTER — Other Ambulatory Visit: Payer: Self-pay | Admitting: Family Medicine

## 2019-06-30 DIAGNOSIS — J4541 Moderate persistent asthma with (acute) exacerbation: Secondary | ICD-10-CM

## 2019-07-19 ENCOUNTER — Other Ambulatory Visit: Payer: Self-pay

## 2019-07-19 ENCOUNTER — Ambulatory Visit (INDEPENDENT_AMBULATORY_CARE_PROVIDER_SITE_OTHER): Payer: Medicare HMO | Admitting: Family Medicine

## 2019-07-19 ENCOUNTER — Encounter: Payer: Self-pay | Admitting: Family Medicine

## 2019-07-19 DIAGNOSIS — J069 Acute upper respiratory infection, unspecified: Secondary | ICD-10-CM

## 2019-07-19 MED ORDER — GUAIFENESIN ER 600 MG PO TB12: 600.0000 mg | ORAL_TABLET | Freq: Two times a day (BID) | ORAL | 0 refills | Status: DC

## 2019-07-19 MED ORDER — LEVOCETIRIZINE DIHYDROCHLORIDE 5 MG PO TABS: 5.0000 mg | ORAL_TABLET | Freq: Every evening | ORAL | 2 refills | Status: DC

## 2019-07-19 NOTE — Progress Notes (Signed)
Name: Lori Potts   MRN: [id number]    DOB: 16-Aug-1946   Date:07/19/2019       Progress Note  Subjective  Chief Complaint  Chief Complaint  Patient presents with  . Sinusitis    cough, congested, headache, pressure over Left eye    I connected with  [name] on 07/19/19 at  9:20 AM EST by telephone and verified that I am speaking with the correct person using two identifiers.   I discussed the limitations, risks, security and privacy concerns of performing an evaluation and management service by telephone and the availability of in person appointments. Staff also discussed with the patient that there may be a patient responsible charge related to this service. Patient Location: Home Provider Location: Office Additional Individuals present: None  HPI  Pt presents with concern for respiratory illness - has been sick since yesterday.  She states she is having nasal congestion, pressure above the left eye, post-nasal drainage, headache, non-productive cough. She is taking singulair, nasonex.  Not taking Xyzal.  She has history of sinusitis in the past, with most recent 06/12/2019 - also had negative COVID-19 testing at that time.  She states she has not had exposure to COVID-19 that she knows of. Temp 97.59F at home.  Denies chest pain, shortness of breath, wheezing, fevers/chills, GI upset.  Patient Active Problem List   Diagnosis Date Noted  . 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 Microalbuminuria 01/22/2018  . Anemia 09/22/2017  . S/P knee replacement 07/28/2017  . Allergic rhinitis 05/26/2017  . Vitamin D deficiency 05/19/2017  . Chronic nonintractable headache 05/18/2017  . Special screening for malignant neoplasms, colon   . Gastroesophageal reflux disease   . Gastritis without bleeding   . 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 Insomnia 11/29/2016  . 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 Vasomotor  flushing 04/19/2016  . Abdominal wall pain in right flank 03/29/2016  . Diverticulosis 02/24/2016  . Hypercholesterolemia 06/16/2015  . FHx: migraine headaches 04/13/2015  . Chronic pain 04/13/2015  . Neck pain 02/26/2015  . Status post lumbar surgery 02/26/2015  . Abnormal ECG 01/27/2015  . Angina pectoris ([place]) 01/27/2015  . 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[place]) 01/27/2015    Social History   Tobacco Use  . Smoking status: Former Smoker    Types: Cigarettes    Quit date: 2006    Years since quitting: 14.9  . Smokeless tobacco: Never Used  . Tobacco comment: quit smoking 10yr ago  Substance Use Topics  . Alcohol use: No     Current Outpatient Medications:  .  albuterol (VENTOLIN HFA) 108 (90 Base) MCG/ACT inhaler, Inhale 1 puff into the lungs every 6 (six) hours as needed for wheezing or shortness of breath., Disp: 18 g, Rfl: 2 .  Alcohol Swabs (B-D SINGLE USE SWABS REGULAR) PADS, 1 application by Does not apply route 2 (two) times daily., Disp: 90 each, Rfl: 0 .  aspirin EC 81 MG tablet, Take 81 mg by mouth daily., Disp: , Rfl:  .  Blood Glucose Monitoring Suppl (TRUE METRIX METER) w/Device KIT, Use to check fingerstick blood sugars once a day; LON 99 months; E11.9, Disp: 1 kit, Rfl: 0 .  budesonide-formoterol (SYMBICORT) 160-4.5 MCG/ACT inhaler, Inhale 2 puffs into the lungs 2 (two) times daily., Disp: 1 Inhaler, Rfl: 3 .  Dulaglutide (TRULICITY) 02.68MTM/1.9QQSOPN, Inject 0.75 mg into the skin once a week., Disp:  4 pen, Rfl: 1 .  glucose blood (ACCU-CHEK AVIVA PLUS) test strip, Use as instructed, Disp: 100 each, Rfl: 12 .  Insulin Pen Needle (BD ULTRA-FINE PEN NEEDLES) 29G X 12.7MM MISC, For use with injectable GLP-1 inhibitor, Victoza, once a day, Disp: 100 each, Rfl: 3 .  ipratropium-albuterol (DUONEB) 0.5-2.5 (3) MG/3ML SOLN, CAN TAKE 3ML EVERY 20 MINUTES FOR 2 DOSES AS NEEDED FOR WHEEZING, Disp: 90 mL, Rfl: 0 .  levocetirizine (XYZAL)  5 MG tablet, Take 1 tablet (5 mg total) by mouth every evening., Disp: 30 tablet, Rfl: 2 .  losartan (COZAAR) 25 MG tablet, Take 1 tablet (25 mg total) by mouth daily., Disp: 90 tablet, Rfl: 1 .  meloxicam (MOBIC) 7.5 MG tablet, Take 1 tablet (7.5 mg total) by mouth daily., Disp: 20 tablet, Rfl: 0 .  mometasone (NASONEX) 50 MCG/ACT nasal spray, Place 2 sprays into the nose daily., Disp: 17 g, Rfl: 1 .  montelukast (SINGULAIR) 10 MG tablet, TAKE 1 TABLET BY MOUTH EVERYDAY AT BEDTIME, Disp: 90 tablet, Rfl: 3 .  oxyCODONE-acetaminophen (PERCOCET) 10-325 MG tablet, Take 1 tablet by mouth 5 (five) times daily. , Disp: , Rfl: 0 .  promethazine (PHENERGAN) 12.5 MG tablet, Take 1 tablet (12.5 mg total) by mouth every 8 (eight) hours as needed for nausea or vomiting., Disp: 10 tablet, Rfl: 0 .  propranolol ER (INDERAL LA) 60 MG 24 hr capsule, TAKE 1 CAPSULE BY MOUTH EVERY DAY, Disp: 30 capsule, Rfl: 1 .  rosuvastatin (CRESTOR) 40 MG tablet, Take 1 tablet (40 mg total) by mouth daily., Disp: 90 tablet, Rfl: 3 .  sodium chloride (OCEAN) 0.65 % SOLN nasal spray, Place 1 spray into both nostrils 2 (two) times daily as needed for congestion., Disp: 60 mL, Rfl: 5 .  TRUE METRIX BLOOD GLUCOSE TEST test strip, TEST BLOOD SUGAR ONE TIME DAILY, Disp: 100 strip, Rfl: 2 .  ibuprofen (ADVIL,MOTRIN) 800 MG tablet, Take 1 tablet (800 mg total) by mouth every 8 (eight) hours as needed. (Patient not taking: Reported on 07/19/2019), Disp: 30 tablet, Rfl: 0 .  Lancets (ACCU-CHEK MULTICLIX) lancets, Use as instructed (Patient not taking: Reported on 07/19/2019), Disp: 100 each, Rfl: 12 .  promethazine-dextromethorphan (PROMETHAZINE-DM) 6.25-15 MG/5ML syrup, Take 5 mLs by mouth 4 (four) times daily as needed. (Patient not taking: Reported on 07/19/2019), Disp: 118 mL, Rfl: 0  Allergies  Allergen Reactions  . Metformin And Related Diarrhea  . Adhesive [Tape] Rash and Other (See Comments)    Regular tape is ok, allergy is to  paper tape    I personally reviewed active problem list, medication list, allergies, notes from last encounter, lab results with the patient/caregiver today.  ROS  Ten systems reviewed and is negative except as mentioned in HPI  Objective  Virtual encounter, vitals not obtained.  There is no height or weight on file to calculate BMI.  Nursing Note and Vital Signs reviewed.  Physical Exam   Pulmonary/Chest: Effort normal. No respiratory distress. Speaking in complete sentences.  Cough present intermittently Neurological: Pt is alert and oriented to person, place, and time. Speech is normal Psychiatric: Patient has a normal mood and affect. behavior is normal. Judgment and thought content normal.  No results found for this or any previous visit (from the past 72 hour(s)).  Assessment & Plan  1. Viral upper respiratory tract infection - Continue nasonex and singulair.  Stay well hydrated.  Explained course of sinusitis in detail with her and that antibiotics are not appropriate  after 1 day of illness and mild to moderate symptoms.  Needs to have COVID-19 testing done as she is elderly, community spread is quite high right now, and she has several chronic diseases putting her at high risk for complicated disease course.  She will present on Monday for testing. - levocetirizine (XYZAL) 5 MG tablet; Take 1 tablet (5 mg total) by mouth every evening.  Dispense: 30 tablet; Refill: 2 - guaiFENesin (MUCINEX) 600 MG 12 hr tablet; Take 1 tablet (600 mg total) by mouth 2 (two) times daily.  Dispense: 20 tablet; Refill: 0 - Novel Coronavirus, NAA ([facility])   -Red flags and when to present for emergency care or RTC including fever >101.39F, chest pain, shortness of breath, new/worsening/un-resolving symptoms, reviewed with patient at time of visit. Follow up and care instructions discussed and provided in AVS. - I discussed the assessment and treatment plan with the patient. The patient was  provided an opportunity to ask questions and all were answered. The patient agreed with the plan and demonstrated an understanding of the instructions.  - The patient was advised to call back or seek an in-person evaluation if the symptoms worsen or if the condition fails to improve as anticipated.  I provided 10 minutes of non-face-to-face time during this encounter.  Hubbard Hartshorn, [name]

## 2019-08-01 DIAGNOSIS — M5416 Radiculopathy, lumbar region: Secondary | ICD-10-CM | POA: Diagnosis not present

## 2019-08-06 ENCOUNTER — Other Ambulatory Visit: Payer: Self-pay | Admitting: Family Medicine

## 2019-08-06 MED ORDER — TRULICITY 0.75 MG/0.5ML ~~LOC~~ SOAJ: 0.7500 mg | SUBCUTANEOUS | 1 refills | Status: DC

## 2019-08-06 NOTE — Telephone Encounter (Signed)
Pt called stating she is needing her trulicity filled. Pt states the medication is delivered to her home. Please advise.

## 2019-09-02 ENCOUNTER — Other Ambulatory Visit: Payer: Self-pay | Admitting: Emergency Medicine

## 2019-09-02 DIAGNOSIS — R062 Wheezing: Secondary | ICD-10-CM

## 2019-09-02 MED ORDER — MONTELUKAST SODIUM 10 MG PO TABS: ORAL_TABLET | ORAL | 3 refills | Status: DC

## 2019-09-05 DIAGNOSIS — M5416 Radiculopathy, lumbar region: Secondary | ICD-10-CM | POA: Diagnosis not present

## 2019-09-11 ENCOUNTER — Ambulatory Visit: Payer: Self-pay | Admitting: Pharmacist

## 2019-09-11 ENCOUNTER — Telehealth: Payer: Self-pay

## 2019-09-11 ENCOUNTER — Other Ambulatory Visit: Payer: Self-pay | Admitting: Neurosurgery

## 2019-09-11 DIAGNOSIS — E1129 Type 2 diabetes mellitus with other diabetic kidney complication: Secondary | ICD-10-CM

## 2019-09-11 DIAGNOSIS — M5416 Radiculopathy, lumbar region: Secondary | ICD-10-CM

## 2019-09-11 DIAGNOSIS — R809 Proteinuria, unspecified: Secondary | ICD-10-CM

## 2019-09-11 NOTE — Telephone Encounter (Signed)
Phone call to patient to verify medication list and allergies for myelogram procedure. Pt instructed to hold promethazine and Promethazine- dextromethorphan syrup for 48hrs prior to myelogram appointment time. Pt verbalized understanding. Pt also instructed that she would be with [place] around 2 hours the day of her procedure, she needs a driver to driver her home after, and she would need to lay flat for 24 hours post procedure. Pt verbalized understanding and stated she would call back if she had any questions.

## 2019-09-13 ENCOUNTER — Other Ambulatory Visit: Payer: Self-pay | Admitting: Emergency Medicine

## 2019-09-13 ENCOUNTER — Other Ambulatory Visit: Payer: Self-pay | Admitting: Family Medicine

## 2019-09-13 DIAGNOSIS — R809 Proteinuria, unspecified: Secondary | ICD-10-CM

## 2019-09-13 DIAGNOSIS — E1329 Other specified diabetes mellitus with other diabetic kidney complication: Secondary | ICD-10-CM

## 2019-09-13 MED ORDER — LOSARTAN POTASSIUM 25 MG PO TABS: 25.0000 mg | ORAL_TABLET | Freq: Every day | ORAL | 1 refills | Status: DC

## 2019-09-13 NOTE — Chronic Care Management (AMB) (Signed)
  Chronic Care Management   Note  09/13/2019 Name: Lori Potts MRN: [id number] DOB: 1946-07-26  Medication assistance outreach for [employer] 2021.   Goals Addressed            This Visit's Progress   . Medication assistance 2021 (pt-stated)       Current Barriers:  . financial  Pharmacist Clinical Goal(s): Over the next 14 days, Lori Potts will provide the necessary supplementary documents (proof of out of pocket prescription expenditure, proof of household income) needed for medication assistance applications to CCM pharmacist.   Interventions: . CCM pharmacist will apply for medication assistance program for Trulicity made by [employer]   Patient Self Care Activities:  Marland Kitchen Gather necessary documents needed to apply for medication assistance  Initial goal documentation         Follow up plan: Telephone follow up appointment with care management team member scheduled for: within 10 days with PharmD  Karalee Height, PharmD Clinical Pharmacist [facility], [facility] [phone]

## 2019-09-13 NOTE — Patient Instructions (Signed)
Goals Addressed            This Visit's Progress   . Medication assistance 2021 (pt-stated)       Current Barriers:  . financial  Pharmacist Clinical Goal(s): Over the next 14 days, [name] will provide the necessary supplementary documents (proof of out of pocket prescription expenditure, proof of household income) needed for medication assistance applications to CCM pharmacist.   Interventions: . CCM pharmacist will apply for medication assistance program for Trulicity made by [employer]   Patient Self Care Activities:  Marland Kitchen Gather necessary documents needed to apply for medication assistance  Initial goal documentation

## 2019-09-18 ENCOUNTER — Ambulatory Visit
Admission: RE | Admit: 2019-09-18 | Discharge: 2019-09-18 | Disposition: A | Discharge status: Home / Self Care | Payer: Medicare HMO | Source: Ambulatory Visit | Attending: Neurosurgery | Admitting: Neurosurgery

## 2019-09-18 DIAGNOSIS — M545 Low back pain: Secondary | ICD-10-CM | POA: Diagnosis not present

## 2019-09-18 DIAGNOSIS — M5416 Radiculopathy, lumbar region: Secondary | ICD-10-CM

## 2019-09-18 MED ORDER — MEPERIDINE HCL 50 MG/ML IJ SOLN
50.0000 mg | Freq: Once | INTRAMUSCULAR | Status: AC
  Administered 2019-09-18: 14:00:00 50 mg via INTRAMUSCULAR

## 2019-09-18 MED ORDER — DIAZEPAM 5 MG PO TABS
5.0000 mg | ORAL_TABLET | Freq: Once | ORAL | Status: AC
  Administered 2019-09-18: 5 mg via ORAL

## 2019-09-18 MED ORDER — ONDANSETRON HCL 4 MG/2ML IJ SOLN
4.0000 mg | Freq: Once | INTRAMUSCULAR | Status: AC
  Administered 2019-09-18: 14:00:00 4 mg via INTRAMUSCULAR

## 2019-09-18 MED ORDER — IOPAMIDOL (ISOVUE-M 200) INJECTION 41%
18.0000 mL | Freq: Once | INTRAMUSCULAR | Status: AC
  Administered 2019-09-18: 14:00:00 18 mL via INTRATHECAL

## 2019-09-18 NOTE — Discharge Instructions (Signed)
Myelogram Discharge Instructions  1. Go home and rest quietly for the next 24 hours.  It is important to lie flat for the next 24 hours.  Get up only to go to the restroom.  You may lie in the bed or on a couch on your back, your stomach, your left side or your right side.  You may have one pillow under your head.  You may have pillows between your knees while you are on your side or under your knees while you are on your back.  2. DO NOT drive today.  Recline the seat as far back as it will go, while still wearing your seat belt, on the way home.  3. You may get up to go to the bathroom as needed.  You may sit up for 10 minutes to eat.  You may resume your normal diet and medications unless otherwise indicated.  Drink lots of extra fluids today and tomorrow.  4. The incidence of headache, nausea, or vomiting is about 5% (one in 20 patients).  If you develop a headache, lie flat and drink plenty of fluids until the headache goes away.  Caffeinated beverages may be helpful.  If you develop severe nausea and vomiting or a headache that does not go away with flat bed rest, call [phone].  5. You may resume normal activities after your 24 hours of bed rest is over; however, do not exert yourself strongly or do any heavy lifting tomorrow. If when you get up you have a headache when standing, go back to bed and force fluids for another 24 hours.  6. Call your physician for a follow-up appointment.  The results of your myelogram will be sent directly to your physician by the following day.  7. If you have any questions or if complications develop after you arrive home, please call [phone].  Discharge instructions have been explained to the patient.  The patient, or the person responsible for the patient, fully understands these instructions  YOU MAY RESUME YOUR PROMETHAZINE AND PROMETHAZINE-DEXTROMETHORPHAN SYRUP TOMORROW 09/19/2019 AT 1:00 PM

## 2019-09-18 NOTE — Discharge Instr - Other Orders (Addendum)
Pt c/o achy pain 10/10 in back due to myelogram procedure. Dr. Mosetta Putt aware, see [facility] for PRN medications   Pt in nursing station, reports relief from pain medication. States pain is 2/10 which is acceptable for pt.

## 2019-09-18 NOTE — Progress Notes (Signed)
Pt reports she has been off of her Promethazine and Promethazine- Dextromethorphan syrup for at least 48 hours.

## 2019-09-25 DIAGNOSIS — M5416 Radiculopathy, lumbar region: Secondary | ICD-10-CM | POA: Diagnosis not present

## 2019-10-09 ENCOUNTER — Other Ambulatory Visit: Payer: Self-pay | Admitting: Family Medicine

## 2019-10-09 NOTE — Telephone Encounter (Signed)
Atorvastatin 80 mg discontinued on 05/14/2019. No provider or med listed on the med list.

## 2019-10-17 ENCOUNTER — Other Ambulatory Visit: Payer: Self-pay | Admitting: Emergency Medicine

## 2019-10-17 DIAGNOSIS — G8929 Other chronic pain: Secondary | ICD-10-CM

## 2019-10-17 DIAGNOSIS — R002 Palpitations: Secondary | ICD-10-CM

## 2019-10-17 DIAGNOSIS — R232 Flushing: Secondary | ICD-10-CM

## 2019-10-17 DIAGNOSIS — I10 Essential (primary) hypertension: Secondary | ICD-10-CM

## 2019-10-17 MED ORDER — PROPRANOLOL HCL ER 60 MG PO CP24: 60.0000 mg | ORAL_CAPSULE | Freq: Every day | ORAL | 3 refills | Status: DC

## 2019-10-18 ENCOUNTER — Other Ambulatory Visit: Payer: Self-pay

## 2019-10-18 MED ORDER — TRULICITY 0.75 MG/0.5ML ~~LOC~~ SOAJ: 0.7500 mg | SUBCUTANEOUS | 1 refills | Status: DC

## 2019-10-18 NOTE — Telephone Encounter (Signed)
Can you refill and print this rx, I need to send for patient assistance.  thanks

## 2019-10-22 ENCOUNTER — Ambulatory Visit (INDEPENDENT_AMBULATORY_CARE_PROVIDER_SITE_OTHER): Payer: Medicare HMO | Admitting: Internal Medicine

## 2019-10-22 ENCOUNTER — Other Ambulatory Visit: Payer: Self-pay

## 2019-10-22 ENCOUNTER — Encounter: Payer: Self-pay | Admitting: Internal Medicine

## 2019-10-22 VITALS — BP 134/84 | HR 81 | Temp 97.1°F | Resp 16 | Ht 66.0 in | Wt 181.0 lb

## 2019-10-22 DIAGNOSIS — Z794 Long term (current) use of insulin: Secondary | ICD-10-CM | POA: Diagnosis not present

## 2019-10-22 DIAGNOSIS — R8290 Unspecified abnormal findings in urine: Secondary | ICD-10-CM

## 2019-10-22 DIAGNOSIS — R809 Proteinuria, unspecified: Secondary | ICD-10-CM

## 2019-10-22 DIAGNOSIS — M545 Low back pain, unspecified: Secondary | ICD-10-CM

## 2019-10-22 DIAGNOSIS — G8929 Other chronic pain: Secondary | ICD-10-CM | POA: Diagnosis not present

## 2019-10-22 DIAGNOSIS — N3090 Cystitis, unspecified without hematuria: Secondary | ICD-10-CM

## 2019-10-22 DIAGNOSIS — R1030 Lower abdominal pain, unspecified: Secondary | ICD-10-CM

## 2019-10-22 DIAGNOSIS — E0829 Diabetes mellitus due to underlying condition with other diabetic kidney complication: Secondary | ICD-10-CM

## 2019-10-22 LAB — POCT URINALYSIS DIPSTICK
Bilirubin, UA: NEGATIVE
Blood, UA: NEGATIVE
Glucose, UA: NEGATIVE
Ketones, UA: NEGATIVE
Nitrite, UA: POSITIVE
Protein, UA: POSITIVE — AB
Spec Grav, UA: 1.015 (ref 1.010–1.025)
Urobilinogen, UA: 0.2 E.U./dL
pH, UA: 6.5 (ref 5.0–8.0)

## 2019-10-22 MED ORDER — CIPROFLOXACIN HCL 500 MG PO TABS: 500.0000 mg | ORAL_TABLET | Freq: Two times a day (BID) | ORAL | 0 refills | Status: AC

## 2019-10-22 NOTE — Progress Notes (Signed)
Patient ID: Lori Potts, female    DOB: 1946-07-25, 74 y.o.   MRN: [id number]  PCP: Delsa Grana, PA-C  Chief Complaint  Patient presents with  . lower abdominal pain  . Back Pain  . urine odor    Subjective:   Lori Potts is a 73 y.o. female, presents to clinic with CC of the following:  Chief Complaint  Patient presents with  . lower abdominal pain  . Back Pain  . urine odor    HPI:  Patient is a 74 year old female who presents with lower abdominal and back pain.  She is a known diabetic with microalbuminuria and has been taking opioids for chronic pain as problems noted as well as others noted in the active problem list.  Her most recent visit at [facility] was with Raelyn Ensign in late November for concerns for a upper respiratory infection, felt to likely be viral.  A prior visit with Raquel Sarna in late October did note chronic right-sided low back pain with right-sided sciatica as part of that visit.  Patient notes that after a fall about a month ago, she has been having more back pain on the left, and has a history of chronic pain that she may need surgery for again she noted.  Denies any pains down the legs, any numbness or tingling in the legs or feet.  She manages this with oxycodone, now taking 1 to 3 tablets a day and is followed for chronic pain management.  In the past couple weeks, her urine has been more foul-smelling, has had increased frequency, no marked pain with urination, and no blood in the urine.  Denies any fever or nausea or vomiting. She is diabetic, and notes her sugars have been good on home checks, running in the low 100s. She notes more of a fullness feeling in her suprapubic region over her bladder area more than any bad abdominal pains.  Her bowel movements have been normal, and no increased constipation symptoms recently.  No change in bowel habits. She noted a history of kidney surgery on the left 21 years ago, and also an abscess in the kidney about  17 years ago, and she was hospitalized for that.  Has had no more recent issues related to the kidneys.  She has a history of hyperlipidemia, and on her med list she had to statins, and she noted she is not taking the Lipitor presently, is taking the Crestor.  She is also noted she refilled the propranolol and took that this weekend, and felt more shortness of breath after taking.  She tried taking again Sunday after took a dose on Saturday, and had similar symptoms and stated she just will not take this presently.  She has been on that for her blood pressure, and she notes her blood pressures have been real good in the recent past.  I did note some concerns with just completely stopping a beta-blocker like propranolol, although she has not taken it since Sunday and has not had any concerning symptoms of chest pains or palpitations, and her blood pressure today was good.  Tob - Former smoker   Patient Active Problem List   Diagnosis Date Noted  . Diabetes mellitus with microalbuminuria ([place]) 12/03/2018  . Microalbuminuria 01/22/2018  . Anemia 09/22/2017  . S/P knee replacement 07/28/2017  . Allergic rhinitis 05/26/2017  . Vitamin D deficiency 05/19/2017  . Chronic nonintractable headache 05/18/2017  . Special screening for malignant neoplasms, colon   .  Gastroesophageal reflux disease   . Gastritis without bleeding   . 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 Insomnia 11/29/2016  . 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 Vasomotor flushing 04/19/2016  . Abdominal wall pain in right flank 03/29/2016  . Diverticulosis 02/24/2016  . Hypercholesterolemia 06/16/2015  . FHx: migraine headaches 04/13/2015  . Chronic pain 04/13/2015  . Neck pain 02/26/2015  . Status post lumbar surgery 02/26/2015  . Abnormal ECG 01/27/2015  . Angina pectoris ([place]) 01/27/2015  . 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[place]) 01/27/2015       Current Outpatient Medications:  .  albuterol (VENTOLIN HFA) 108 (90 Base) MCG/ACT inhaler, Inhale 1 puff into the lungs every 6 (six) hours as needed for wheezing or shortness of breath., Disp: 18 g, Rfl: 2 .  Alcohol Swabs (B-D SINGLE USE SWABS REGULAR) PADS, 1 application by Does not apply route 2 (two) times daily., Disp: 90 each, Rfl: 0 .  aspirin EC 81 MG tablet, Take 81 mg by mouth daily., Disp: , Rfl:  .  atorvastatin (LIPITOR) 80 MG tablet, TAKE 1 TABLET EVERY DAY, Disp: 90 tablet, Rfl: 3 .  Blood Glucose Monitoring Suppl (TRUE METRIX METER) w/Device KIT, Use to check fingerstick blood sugars once a day; LON 99 months; E11.9, Disp: 1 kit, Rfl: 0 .  budesonide-formoterol (SYMBICORT) 160-4.5 MCG/ACT inhaler, Inhale 2 puffs into the lungs 2 (two) times daily., Disp: 1 Inhaler, Rfl: 3 .  Dulaglutide (TRULICITY) 1.44 RX/5.4MG SOPN, Inject 0.75 mg into the skin once a week., Disp: 8 pen, Rfl: 1 .  glucose blood (ACCU-CHEK AVIVA PLUS) test strip, Use as instructed, Disp: 100 each, Rfl: 12 .  Insulin Pen Needle (BD ULTRA-FINE PEN NEEDLES) 29G X 12.7MM MISC, For use with injectable GLP-1 inhibitor, Victoza, once a day, Disp: 100 each, Rfl: 3 .  ipratropium-albuterol (DUONEB) 0.5-2.5 (3) MG/3ML SOLN, CAN TAKE 3ML EVERY 20 MINUTES FOR 2 DOSES AS NEEDED FOR WHEEZING, Disp: 90 mL, Rfl: 0 .  Lancets (ACCU-CHEK MULTICLIX) lancets, Use as instructed, Disp: 100 each, Rfl: 12 .  levocetirizine (XYZAL) 5 MG tablet, Take 1 tablet (5 mg total) by mouth every evening., Disp: 30 tablet, Rfl: 2 .  losartan (COZAAR) 25 MG tablet, Take 1 tablet (25 mg total) by mouth daily., Disp: 90 tablet, Rfl: 1 .  oxyCODONE-acetaminophen (PERCOCET) 10-325 MG tablet, Take 1 tablet by mouth 5 (five) times daily. , Disp: , Rfl: 0 .  promethazine (PHENERGAN) 12.5 MG tablet, Take 1 tablet (12.5 mg total) by mouth every 8 (eight) hours as needed for nausea or vomiting., Disp: 10 tablet, Rfl: 0 .  rosuvastatin (CRESTOR) 40 MG  tablet, Take 1 tablet (40 mg total) by mouth daily., Disp: 90 tablet, Rfl: 3 .  TRUE METRIX BLOOD GLUCOSE TEST test strip, TEST BLOOD SUGAR ONE TIME DAILY, Disp: 100 strip, Rfl: 2 .  guaiFENesin (MUCINEX) 600 MG 12 hr tablet, Take 1 tablet (600 mg total) by mouth 2 (two) times daily. (Patient not taking: Reported on 10/22/2019), Disp: 20 tablet, Rfl: 0 .  ibuprofen (ADVIL,MOTRIN) 800 MG tablet, Take 1 tablet (800 mg total) by mouth every 8 (eight) hours as needed. (Patient not taking: Reported on 10/22/2019), Disp: 30 tablet, Rfl: 0 .  propranolol ER (INDERAL LA) 60 MG 24 hr capsule, Take 1 capsule (60 mg total) by mouth daily. (Patient not taking: Reported on 10/22/2019), Disp: 90 capsule, Rfl: 3 .  sodium chloride (OCEAN) 0.65 % SOLN nasal  spray, Place 1 spray into both nostrils 2 (two) times daily as needed for congestion., Disp: 60 mL, Rfl: 5   Allergies  Allergen Reactions  . Metformin And Related Diarrhea  . Adhesive [Tape] Rash and Other (See Comments)    Regular tape is ok, allergy is to paper tape     Past Surgical History:  Procedure Laterality Date  . ABDOMINAL HYSTERECTOMY    . ANTERIOR CERVICAL DECOMP/DISCECTOMY FUSION N/A 02/26/2015   Procedure: ANTERIOR CERVICAL DISCECTOMY FUSION C4-5 (1 LEVEL);  Surgeon: Melina Schools, MD;  Location: [place];  Service: Orthopedics;  Laterality: N/A;  . ANTERIOR LAT LUMBAR FUSION N/A 03/21/2013   Procedure: ANTERIOR LATERAL LUMBAR FUSION 1 LEVEL/ XLIF L3-L4 ;  Surgeon: Melina Schools, MD;  Location: [place];  Service: Orthopedics;  Laterality: N/A;  . APPENDECTOMY    . AUGMENTATION MAMMAPLASTY Bilateral 1978  . BACK SURGERY    . BACK SURGERY     Lumbar fusion x 2  . BREAST EXCISIONAL BIOPSY Right 1970  . BREAST SURGERY  1990   Augementation  . CARDIAC CATHETERIZATION  5/12   ef 55%  . CARDIAC CATHETERIZATION  10/2010   [facility]; EF 55%  . CARDIAC CATHETERIZATION Left 02/16/2016   Procedure: Left Heart Cath and Coronary Angiography;  Surgeon: Yolonda Kida, MD;  Location: [place] [facility];  Service: Cardiovascular;  Laterality: Left;  . COLONOSCOPY    . COLONOSCOPY WITH PROPOFOL N/A 04/10/2017   Procedure: COLONOSCOPY WITH PROPOFOL;  Surgeon: Lucilla Lame, MD;  Location: [place];  Service: Gastroenterology;  Laterality: N/A;  Diabetic - insulin  . ESOPHAGOGASTRODUODENOSCOPY    . [place]   growth removed from left kidney   . LEFT HEART CATH AND CORONARY ANGIOGRAPHY Left 09/10/2018   Procedure: LEFT HEART CATH AND CORONARY ANGIOGRAPHY;  Surgeon: Yolonda Kida, MD;  Location: [place] [facility];  Service: Cardiovascular;  Laterality: Left;  . pain stimulator    . POSTERIOR CERVICAL FUSION/FORAMINOTOMY Right 03/21/2013   Procedure: POSTERIOR L2-3 RIGHT FORAMINOTOMY;  Surgeon: Melina Schools, MD;  Location: [place];  Service: Orthopedics;  Laterality: Right;  . REVISION OF SCAR TISSUE RECTUS MUSCLE    . SMALL BOWEL REPAIR    . SPINAL CORD STIMULATOR BATTERY EXCHANGE N/A 10/17/2012   Procedure: SPINAL CORD STIMULATOR BATTERY REMOVAL;  Surgeon: Melina Schools, MD;  Location: [place];  Service: Orthopedics;  Laterality: N/A;  . SPINAL CORD STIMULATOR BATTERY EXCHANGE N/A 07/22/2015   Procedure: REIMPLANTATION OF SPINAL CORD STIMULATOR BATTERY ;  Surgeon: Melina Schools, MD;  Location: [place];  Service: Orthopedics;  Laterality: N/A;  . TOTAL KNEE ARTHROPLASTY Left   . TOTAL KNEE ARTHROPLASTY Right 07/28/2017   Procedure: RIGHT TOTAL KNEE ARTHROPLASTY;  Surgeon: Sydnee Cabal, MD;  Location: [facility];  Service: Orthopedics;  Laterality: Right;     Family History  Problem Relation Age of Onset  . Heart attack Mother   . Sarcoidosis Sister      Social History   Tobacco Use  . Smoking status: Former Smoker    Types: Cigarettes    Quit date: 2006    Years since quitting: 15.1  . Smokeless tobacco: Never Used  . Tobacco comment: quit smoking 84yr ago  Substance Use Topics  . Alcohol use: No    With staff  assistance, above reviewed with the patient today.  ROS: As per HPI, otherwise no specific complaints on a limited and focused system review   No results found  for this or any previous visit (from the past 59 hour(s)).   PHQ2/9: Depression screen [facility] 2/9 10/22/2019 07/19/2019 06/12/2019 05/14/2019 05/14/2019  Decreased Interest 0 0 0 0 0  Down, Depressed, Hopeless 0 0 0 0 0  PHQ - 2 Score 0 0 0 0 0  Altered sleeping 0 0 0 0 0  Tired, decreased energy 0 0 0 0 0  Change in appetite 0 0 0 0 0  Feeling bad or failure about yourself  0 0 0 0 0  Trouble concentrating 0 0 0 0 0  Moving slowly or fidgety/restless 0 0 0 0 0  Suicidal thoughts 0 0 0 0 0  PHQ-9 Score 0 0 0 0 0  Difficult doing work/chores Not difficult at all Not difficult at all Not difficult at all - Not difficult at all  Some recent data might be hidden   PHQ-2/9 Result is neg  Fall Risk: Fall Risk  10/22/2019 07/19/2019 06/12/2019 05/14/2019 03/25/2019  Falls in the past year? 1 0 0 1 0  Comment chair broke and piece hit her back and it hurts bad - - - -  Number falls in past yr: 0 0 0 0 0  Injury with Fall? 1 0 0 0 0  Risk for fall due to : - - - - -  Follow up - Falls evaluation completed Falls evaluation completed - Falls evaluation completed      Objective:   Vitals:   10/22/19 0920  BP: 134/84  Pulse: 81  Resp: 16  Temp: (!) 97.1 F (36.2 C)  TempSrc: Temporal  SpO2: 98%  Weight: 181 lb (82.1 kg)  Height: '5\' 6"'$  (1.676 m)    Body mass index is 29.21 kg/m.  Physical Exam   NAD, masked, pleasant HEENT - [place]/AT, sclera anicteric, PERRL, EOMI, conj - non-inj'ed, pharynx clear Neck - supple, no adenopathy, no TM,  Car - RRR without m/g/r Pulm- RR and effort normal at rest, CTA without wheeze or rales Abd - soft, protuberant, NT, ND, BS+,  no masses, no suprapubic tenderness to palpation, no rebound or guarding present. Back -marked diffuse tenderness in the left back region, extending from the CVA down  towards the waistline, with mild left CVA tenderness to palpation, no right CVA tenderness nontender over the lumbosacral spine with palpation.  Struggled trying to lay flat on the table and then trying to arise from the supine position due to pain in the lower back. Ext - no markedLE edema,  Neuro/psychiatric - affect was not flat, appropriate with conversation  Alert and oriented  Grossly non-focal - good strength on testing extremities, including with dorsi and plantar flexion of the feet, deep tendon reflexes were 2+ equal in the patella and Achilles, sensation grossly intact to light touch in the distal extremities.  Speech normal   Urine dip in the office was positive for nitrites and leukocytes. Also + protein.  Was foul-smelling.  Results for orders placed or performed in visit on 06/12/19  Novel Coronavirus, NAA ([facility])   Specimen: Oropharyngeal(OP) collection in vial transport medium   OROPHARYNGEA  TESTING  Result Value Ref Range   SARS-CoV-2, NAA Not Detected Not Detected       Assessment & Plan:   1. Abnormal urine odor  - POCT Urinalysis Dipstick - Urinalysis, Complete - Urine Culture  2. Cystitis Do feel she has a UTI, and possibly an early pyelonephritis concern.  Difficult to say if the CVA tenderness is related to her  chronic back pain as it seems to have been present for a longer duration than the last couple weeks when her urine became more foul-smelling.  She has no fevers, nor any nausea or vomiting. Given her history, do feel a course of Cipro is indicated, and prescribed.  Do feel 5-day course is sufficient, to help minimize any longer-term risk concerns from the antibiotic. Also will send the urine for UA, culture and sensitivity. Ciprofloxacin-500 mg twice daily for 5 days prescribed  3. 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, with long-term current use of insulin (HCC) Do feel the frequency is more likely due to the infection,  and her sugars have remained fairly well controlled in the recent past.  4. Chronic left-sided low back pain without sciatica Do feel has some chronic pain and mechanical component to this, and has strong pain medicines to help and has been followed by chronic pain management. Await response with the antibiotic course above, and did recommend some local measures to help with her chronic back pain.  5. Other chronic pain As noted above.  Did note concerns with constipation issues with chronic opioids, and she has been aware of that and seems to be doing well in that regard presently.  Not feel contributing to some of the lower abdominal pressure presently.  6.  Recent intolerance of the propranolol As above noted, she does not want to return to taking, and echo'ed concerns with stopping beta-blockers cold [place], although she has been off of it now since the weekend. Will assess how she does off of the medicine, as she states she is just not going to return to taking it at this time.  7.  Lower abdominal pressure more than marked pain  Do feel it is related to the infection and await response after treatment.  To follow-up if urinary symptoms are not improving or worsening despite the above and await urine culture and sensitivities to help ensure antibiotic is effective.      Towanda Malkin, MD 10/22/19 9:33 AM

## 2019-10-24 LAB — URINALYSIS, COMPLETE
Bilirubin Urine: NEGATIVE
Glucose, UA: NEGATIVE
Hgb urine dipstick: NEGATIVE
Hyaline Cast: NONE SEEN /LPF
Ketones, ur: NEGATIVE
Nitrite: POSITIVE — AB
RBC / HPF: NONE SEEN /HPF (ref 0–2)
Specific Gravity, Urine: 1.014 (ref 1.001–1.03)
pH: 6.5 (ref 5.0–8.0)

## 2019-10-24 LAB — URINE CULTURE
MICRO NUMBER:: 10205938
SPECIMEN QUALITY:: ADEQUATE

## 2019-10-29 DIAGNOSIS — M5416 Radiculopathy, lumbar region: Secondary | ICD-10-CM | POA: Diagnosis not present

## 2019-11-05 ENCOUNTER — Ambulatory Visit (INDEPENDENT_AMBULATORY_CARE_PROVIDER_SITE_OTHER): Payer: Medicare HMO

## 2019-11-05 VITALS — BP 138/64 | HR 64 | Ht 66.0 in | Wt 181.0 lb

## 2019-11-05 DIAGNOSIS — Z1231 Encounter for screening mammogram for malignant neoplasm of breast: Secondary | ICD-10-CM

## 2019-11-05 DIAGNOSIS — Z0000 Encounter for general adult medical examination without abnormal findings: Secondary | ICD-10-CM

## 2019-11-05 DIAGNOSIS — Z780 Asymptomatic menopausal state: Secondary | ICD-10-CM

## 2019-11-05 NOTE — Progress Notes (Signed)
Subjective:   Lori Potts is a 74 y.o. female who presents for Medicare Annual (Subsequent) preventive examination.  Virtual Visit via Telephone Note  I connected with Lori Potts on 11/05/19 at  9:20 AM EDT by telephone and verified that I am speaking with the correct person using two identifiers.  Medicare Annual Wellness visit completed telephonically due to Covid-19 pandemic.   Location: Patient: home Provider: office   I discussed the limitations, risks, security and privacy concerns of performing an evaluation and management service by telephone and the availability of in person appointments. The patient expressed understanding and agreed to proceed.  Some vital signs may be absent or patient reported.   Lori Marker, LPN    Review of Systems:   Cardiac Risk Factors include: advanced age (>42mn, >>47women);diabetes mellitus;dyslipidemia;hypertension     Objective:     Vitals: BP 138/64   Pulse 64   Ht _0  (1.676 m)   Wt 181 lb (82.1 kg)   BMI 29.21 kg/m   Body mass index is 29.21 kg/m.  Advanced Directives 11/05/2019 10/30/2018 09/10/2018 08/04/2018 11/30/2017 07/28/2017 07/24/2017  Does Patient Have a Medical Advance Directive? No _1  No  Type of Advance Directive - Living will;Healthcare Power of [place]-  Does patient want to make changes to medical advance directive? - - No - Patient declined - - No - Patient declined -  Copy of [place] Chart? - No - copy requested No - copy requested - - - -  Would patient like information on creating a medical advance directive? Yes (MAU/Ambulatory/Procedural Areas - Information given) - - - - - No - Patient declined    Tobacco Social History   Tobacco Use  Smoking Status Former Smoker  . Types: Cigarettes  . Quit date: 2006  .  Years since quitting: 15.2  Smokeless Tobacco Never Used  Tobacco Comment   quit smoking 170yrago     Counseling given: Not Answered Comment: quit smoking [id number]   Clinical Intake:  Pre-visit preparation completed: Yes  Pain : 0-10 Pain Score: 8  Pain Type: Chronic pain Pain Location: Back(hip) Pain Orientation: Left Pain Descriptors / Indicators: Shooting Pain Onset: More than a month ago Pain Frequency: Constant     BMI - recorded: 29.21 Nutritional Status: BMI 25 -29 Overweight Nutritional Risks: None Diabetes: Yes CBG done?: No Did pt. bring in CBG monitor from home?: No   Nutrition Risk Assessment:  Has the patient had any N/V/D within the last 2 months?  No  Does the patient have any non-healing wounds?  No  Has the patient had any unintentional weight loss or weight gain?  No   Diabetes:  Is the patient diabetic?  Yes  If diabetic, was a CBG obtained today?  No  Did the patient bring in their glucometer from home?  No  How often do you monitor your CBG's? daily.   Financial Strains and Diabetes Management:  Are you having any financial strains with the device, your supplies or your medication? No .  Does the patient want to be seen by Chronic Care Management for management of their diabetes?  No  Would the patient like to be referred to a Nutritionist or for Diabetic Management?  No   Diabetic Exams:  Diabetic Eye Exam: Completed 12/27/16 negative retinopathy. Overdue for  diabetic eye exam. Pt has been advised about the importance in completing this exam.   Diabetic Foot Exam: Completed 05/14/19.   How often do you need to have someone help you when you read instructions, pamphlets, or other written materials from your doctor or pharmacy?: 1 - Never  Interpreter Needed?: No  Information entered by :: Lori Marker LPN  Past Medical History:  Diagnosis Date  . Arthritis   . Asthma   . 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 Cataract    right  eye but immature  . Essential hypertension, benign    takes Lisinopril-HCTZ daily  . GERD (gastroesophageal reflux disease)   . Headache    sinus  . History of colon polyps    benign  . History of migraine   . History of shingles   . Hyperlipidemia    takes Lipitor daily  . Low back pain   . Nocturia   . S/P insertion of spinal cord stimulator   . Seasonal allergies    takes Singulair daily as needed  . Type II or unspecified type diabetes mellitus without mention of complication, not stated as uncontrolled   . Vitamin D deficiency    takes Vit D weekly  . Weakness    numbness and tingling left arm  . Wears dentures    full upper and lower   Past Surgical History:  Procedure Laterality Date  . ABDOMINAL HYSTERECTOMY    . ANTERIOR CERVICAL DECOMP/DISCECTOMY FUSION N/A 02/26/2015   Procedure: ANTERIOR CERVICAL DISCECTOMY FUSION C4-5 (1 LEVEL);  Surgeon: Melina Schools, MD;  Location: [place];  Service: Orthopedics;  Laterality: N/A;  . ANTERIOR LAT LUMBAR FUSION N/A 03/21/2013   Procedure: ANTERIOR LATERAL LUMBAR FUSION 1 LEVEL/ XLIF L3-L4 ;  Surgeon: Melina Schools, MD;  Location: [place];  Service: Orthopedics;  Laterality: N/A;  . APPENDECTOMY    . AUGMENTATION MAMMAPLASTY Bilateral 1978  . BACK SURGERY    . BACK SURGERY     Lumbar fusion x 2  . BREAST EXCISIONAL BIOPSY Right 1970  . BREAST SURGERY  1990   Augementation  . CARDIAC CATHETERIZATION  5/12   ef 55%  . CARDIAC CATHETERIZATION  10/2010   [facility]; EF 55%  . CARDIAC CATHETERIZATION Left 02/16/2016   Procedure: Left Heart Cath and Coronary Angiography;  Surgeon: Lori Kida, MD;  Location: [facility];  Service: Cardiovascular;  Laterality: Left;  . COLONOSCOPY    . COLONOSCOPY WITH PROPOFOL N/A 04/10/2017   Procedure: COLONOSCOPY WITH PROPOFOL;  Surgeon: Lori Lame, MD;  Location: [facility];  Service: Gastroenterology;  Laterality: N/A;  Diabetic - insulin  . ESOPHAGOGASTRODUODENOSCOPY    .  [place]   growth removed from left kidney   . LEFT HEART CATH AND CORONARY ANGIOGRAPHY Left 09/10/2018   Procedure: LEFT HEART CATH AND CORONARY ANGIOGRAPHY;  Surgeon: Lori Kida, MD;  Location: [facility];  Service: Cardiovascular;  Laterality: Left;  . pain stimulator    . POSTERIOR CERVICAL FUSION/FORAMINOTOMY Right 03/21/2013   Procedure: POSTERIOR L2-3 RIGHT FORAMINOTOMY;  Surgeon: Melina Schools, MD;  Location: [facility];  Service: Orthopedics;  Laterality: Right;  . REVISION OF SCAR TISSUE RECTUS MUSCLE    . SMALL BOWEL REPAIR    . SPINAL CORD STIMULATOR BATTERY EXCHANGE N/A 10/17/2012   Procedure: SPINAL CORD STIMULATOR BATTERY REMOVAL;  Surgeon: Melina Schools, MD;  Location: [place];  Service: Orthopedics;  Laterality: N/A;  . SPINAL CORD STIMULATOR  BATTERY EXCHANGE N/A 07/22/2015   Procedure: REIMPLANTATION OF SPINAL CORD STIMULATOR BATTERY ;  Surgeon: Melina Schools, MD;  Location: [place];  Service: Orthopedics;  Laterality: N/A;  . TOTAL KNEE ARTHROPLASTY Left   . TOTAL KNEE ARTHROPLASTY Right 07/28/2017   Procedure: RIGHT TOTAL KNEE ARTHROPLASTY;  Surgeon: Sydnee Cabal, MD;  Location: [facility];  Service: Orthopedics;  Laterality: Right;   Family History  Problem Relation Age of Onset  . Heart attack Mother   . Sarcoidosis Sister    Social History   Socioeconomic History  . Marital status: Married    Spouse name: Gwyndolyn Saxon  . Number of children: 3  . Years of education: Not on file  . Highest education level: Associate degree: occupational, Hotel manager, or vocational program  Occupational History  . Not on file  Tobacco Use  . Smoking status: Former Smoker    Types: Cigarettes    Quit date: 2006    Years since quitting: 15.2  . Smokeless tobacco: Never Used  . Tobacco comment: quit smoking 55yr ago  Substance and Sexual Activity  . Alcohol use: No  . Drug use: No  . Sexual activity: Yes    Partners: Male    Birth control/protection: Surgical    Other Topics Concern  . Not on file  Social History Narrative  . Not on file   Social Determinants of Health   Financial Resource Strain: Low Risk   . Difficulty of Paying Living Expenses: Not hard at all  Food Insecurity: No Food Insecurity  . Worried About RCharity fundraiserin the Last Year: Never true  . Ran Out of Food in the Last Year: Never true  Transportation Needs: No Transportation Needs  . Lack of Transportation (Medical): No  . Lack of Transportation (Non-Medical): No  Physical Activity: Inactive  . Days of Exercise per Week: 0 days  . Minutes of Exercise per Session: 0 min  Stress: No Stress Concern Present  . Feeling of Stress : Not at all  Social Connections: Not Isolated  . Frequency of Communication with Friends and Family: More than three times a week  . Frequency of Social Gatherings with Friends and Family: Twice a week  . Attends Religious Services: More than 4 times per year  . Active Member of Clubs or Organizations: Yes  . Attends CArchivistMeetings: 1 to 4 times per year  . Marital Status: Married    Outpatient Encounter Medications as of 11/05/2019  Medication Sig  . albuterol (VENTOLIN HFA) 108 (90 Base) MCG/ACT inhaler Inhale 1 puff into the lungs every 6 (six) hours as needed for wheezing or shortness of breath.  . Alcohol Swabs (B-D SINGLE USE SWABS REGULAR) PADS 1 application by Does not apply route 2 (two) times daily.  .Marland Kitchenaspirin EC 81 MG tablet Take 81 mg by mouth daily.  . Blood Glucose Monitoring Suppl (TRUE METRIX METER) w/Device KIT Use to check fingerstick blood sugars once a day; LON 99 months; E11.9  . budesonide-formoterol (SYMBICORT) 160-4.5 MCG/ACT inhaler Inhale 2 puffs into the lungs 2 (two) times daily.  . Dulaglutide (TRULICITY) 04.19MQQ/2.2LNSOPN Inject 0.75 mg into the skin once a week.  .Marland KitchenguaiFENesin (MUCINEX) 600 MG 12 hr tablet Take 1 tablet (600 mg total) by mouth 2 (two) times daily.  . Insulin Pen Needle (BD  ULTRA-FINE PEN NEEDLES) 29G X 12.7MM MISC For use with injectable GLP-1 inhibitor, Victoza, once a day  . ipratropium-albuterol (DUONEB) 0.5-2.5 (3) MG/3ML SOLN  CAN TAKE 3ML EVERY 20 MINUTES FOR 2 DOSES AS NEEDED FOR WHEEZING  . Lancets (ACCU-CHEK MULTICLIX) lancets Use as instructed  . levocetirizine (XYZAL) 5 MG tablet Take 1 tablet (5 mg total) by mouth every evening.  Marland Kitchen losartan (COZAAR) 25 MG tablet Take 1 tablet (25 mg total) by mouth daily.  Marland Kitchen oxyCODONE-acetaminophen (PERCOCET) 10-325 MG tablet Take 1 tablet by mouth 5 (five) times daily.   . propranolol ER (INDERAL LA) 60 MG 24 hr capsule Take 1 capsule (60 mg total) by mouth daily.  . rosuvastatin (CRESTOR) 40 MG tablet Take 1 tablet (40 mg total) by mouth daily.  . sodium chloride (OCEAN) 0.65 % SOLN nasal spray Place 1 spray into both nostrils 2 (two) times daily as needed for congestion.  . TRUE METRIX BLOOD GLUCOSE TEST test strip TEST BLOOD SUGAR ONE TIME DAILY  . [DISCONTINUED] glucose blood (ACCU-CHEK AVIVA PLUS) test strip Use as instructed  . [DISCONTINUED] ibuprofen (ADVIL,MOTRIN) 800 MG tablet Take 1 tablet (800 mg total) by mouth every 8 (eight) hours as needed. (Patient not taking: Reported on 10/22/2019)  . [DISCONTINUED] promethazine (PHENERGAN) 12.5 MG tablet Take 1 tablet (12.5 mg total) by mouth every 8 (eight) hours as needed for nausea or vomiting.   No facility-administered encounter medications on file as of 11/05/2019.    Activities of Daily Living In your present state of health, do you have any difficulty performing the following activities: 11/05/2019 10/22/2019  Hearing? N N  Comment declines hearing aids -  Vision? N N  Comment - -  Difficulty concentrating or making decisions? N N  Comment - -  Walking or climbing stairs? N N  Dressing or bathing? N N  Doing errands, shopping? N N  Preparing Food and eating ? N -  Using the Toilet? N -  In the past six months, have you accidently leaked urine? N -  Do  you have problems with loss of bowel control? N -  Managing your Medications? N -  Managing your Finances? N -  Housekeeping or managing your Housekeeping? N -  Some recent data might be hidden    Patient Care Team: Delsa Grana, PA-C as PCP - General (Family Medicine) Cathi Roan, [facility] (Pharmacist)    Assessment:   This is a routine wellness examination for [facility].  Exercise Activities and Dietary recommendations Current Exercise Habits: The patient does not participate in regular exercise at present  Goals    . Medication assistance 2021 (pt-stated)     Current Barriers:  . financial  Pharmacist Clinical Goal(s): Over the next 14 days, [name] will provide the necessary supplementary documents (proof of out of pocket prescription expenditure, proof of household income) needed for medication assistance applications to CCM pharmacist.   Interventions: . CCM pharmacist will apply for medication assistance program for Trulicity made by [employer]   Patient Self Care Activities:  Marland Kitchen Gather necessary documents needed to apply for medication assistance  Initial goal documentation     . Patient Stated     Patient states she would like to stay active and possibly take some courses online.        Fall Risk Fall Risk  11/05/2019 10/22/2019 07/19/2019 06/12/2019 05/14/2019  Falls in the past year? 1 1 0 0 1  Comment - chair broke and piece hit her back and it hurts bad - - -  Number falls in past yr: 1 0 0 0 0  Injury with Fall? 1 1 0 0  0  Risk for fall due to : History of fall(s) - - - -  Follow up Falls prevention discussed - Falls evaluation completed Falls evaluation completed -   FALL RISK PREVENTION PERTAINING TO THE HOME:  Any stairs in or around the home? Yes  If so, do they handrails? Yes   Home free of loose throw rugs in walkways, pet beds, electrical cords, etc? Yes  Adequate lighting in your home to reduce risk of falls? Yes   ASSISTIVE DEVICES UTILIZED TO  PREVENT FALLS:  Life alert? No  Use of a cane, walker or w/c? No  Grab bars in the bathroom? No  Shower chair or bench in shower? Yes  Elevated toilet seat or a handicapped toilet? Yes  DME ORDERS:  DME order needed?  No   TIMED UP AND GO:  Was the test performed? No . Telephonic visit.   Education: Fall risk prevention has been discussed.  Intervention(s) required? No    Depression Screen PHQ 2/9 Scores 11/05/2019 10/22/2019 07/19/2019 06/12/2019  PHQ - 2 Score 0 0 0 0  PHQ- 9 Score - 0 0 0     Cognitive Function     6CIT Screen 11/05/2019 10/30/2018  What Year? 0 points 0 points  What month? 0 points 0 points  What time? 0 points 0 points  Count back from 20 0 points 0 points  Months in reverse 4 points 0 points  Repeat phrase 0 points 2 points  Total Score 4 2    Immunization History  Administered Date(s) Administered  . Fluad Quad(high Dose 65+) 05/08/2019  . Influenza, High Dose Seasonal PF 06/10/2015, 06/20/2016, 05/09/2017, 05/09/2018  . Pneumococcal Conjugate-13 11/28/2013  . Pneumococcal Polysaccharide-23 03/16/2018  . Tdap 05/08/2019    Qualifies for Shingles Vaccine? Yes . Due for Shingrix. Education has been provided regarding the importance of this vaccine. Pt has been advised to call insurance company to determine out of pocket expense. Advised may also receive vaccine at local pharmacy or Health Dept. Verbalized acceptance and understanding.  Tdap: Up to date  Flu Vaccine: Up to date  Pneumococcal Vaccine: Up to date   Screening Tests Health Maintenance  Topic Date Due  . OPHTHALMOLOGY EXAM  12/27/2017  . MAMMOGRAM  05/04/2018  . HEMOGLOBIN A1C  10/01/2019  . FOOT EXAM  05/13/2020  . COLONOSCOPY  04/11/2027  . TETANUS/TDAP  05/07/2029  . INFLUENZA VACCINE  Completed  . DEXA SCAN  Completed  . Hepatitis C Screening  Completed  . PNA vac Low Risk Adult  Completed    Cancer Screenings:  Colorectal Screening: Completed 04/10/17. Repeat  every 10 years;   Mammogram: Completed 05/04/17. Repeat every year. Ordered today. Pt provided with contact information and advised to call to schedule appt.   Bone Density: Completed 09/26/13. Results reflect NORMAL. Repeat every 2 years. Ordered today. Pt provided with contact information and advised to call to schedule appt.   Lung Cancer Screening: (Low Dose CT Chest recommended if Age 7-80 years, 30 pack-year currently smoking OR have quit w/in 15years.) does not qualify.   Additional Screening:  Hepatitis C Screening: does qualify; Completed 05/26/17  Vision Screening: Recommended annual ophthalmology exams for early detection of glaucoma and other disorders of the eye. Is the patient up to date with their annual eye exam?  No  Who is the provider or what is the name of the office in which the pt attends annual eye exams? [place]  Dental Screening: Recommended annual  dental exams for proper oral hygiene  Community Resource Referral:  CRR required this visit?  No      Plan:     I have personally reviewed and addressed the Medicare Annual Wellness questionnaire and have noted the following in the patient's chart:  A. Medical and social history B. Use of alcohol, tobacco or illicit drugs  C. Current medications and supplements D. Functional ability and status E.  Nutritional status F.  Physical activity G. Advance directives Lori. List of other physicians I.  Hospitalizations, surgeries, and ER visits in previous 12 months J.  [place] such as hearing and vision if needed, cognitive and depression L. Referrals and appointments   In addition, I have reviewed and discussed with patient certain preventive protocols, quality metrics, and best practice recommendations. A written personalized care plan for preventive services as well as general preventive health recommendations were provided to patient.   Signed,  Lori Marker, LPN Nurse Health  Advisor   Nurse Notes: pt states she completed Covid vaccine Therapist, music) earlier this year but did not have card with her for dates. Pt advised to schedule routine follow up with Delsa Grana PAC due for 6 month A1c.

## 2019-11-05 NOTE — Patient Instructions (Signed)
Lori Potts , Thank you for taking time to come for your Medicare Wellness Visit. I appreciate your ongoing commitment to your health goals. Please review the following plan we discussed and let me know if I can assist you in the future.   Screening recommendations/referrals: Colonoscopy: done 04/10/17 Mammogram: done 05/04/17. Please call [phone] to schedule your mammogram and bone density screening.  Bone Density: done 09/26/13 Recommended yearly ophthalmology/optometry visit for glaucoma screening and checkup Recommended yearly dental visit for hygiene and checkup  Vaccinations: Influenza vaccine: done 05/08/19 Pneumococcal vaccine: done 03/16/18 Tdap vaccine: done 05/08/19 Shingles vaccine: Shingrix discussed. Please contact your pharmacy for coverage information.  Covid-19: completed- please bring card with dates to next appt  Advanced directives: Advance directive discussed with you today. I have provided a copy for you to complete at home and have notarized. Once this is complete please bring a copy in to our office so we can scan it into your chart.  Conditions/risks identified: Recommend increasing physical activity to at least 3 days per week  Next appointment: Please follow up in one year for your Medicare Annual Wellness visit.     Preventive Care 74 Years and Older, Female Preventive care refers to lifestyle choices and visits with your health care provider that can promote health and wellness. What does preventive care include?  A yearly physical exam. This is also called an annual well check.  Dental exams once or twice a year.  Routine eye exams. Ask your health care provider how often you should have your eyes checked.  Personal lifestyle choices, including:  Daily care of your teeth and gums.  Regular physical activity.  Eating a healthy diet.  Avoiding tobacco and drug use.  Limiting alcohol use.  Practicing safe sex.  Taking low-dose aspirin every  day.  Taking vitamin and mineral supplements as recommended by your health care provider. What happens during an annual well check? The services and screenings done by your health care provider during your annual well check will depend on your age, overall health, lifestyle risk factors, and family history of disease. Counseling  Your health care provider may ask you questions about your:  Alcohol use.  Tobacco use.  Drug use.  Emotional well-being.  Home and relationship well-being.  Sexual activity.  Eating habits.  History of falls.  Memory and ability to understand (cognition).  Work and work Astronomer.  Reproductive health. Screening  You may have the following tests or measurements:  Height, weight, and BMI.  Blood pressure.  Lipid and cholesterol levels. These may be checked every 5 years, or more frequently if you are over 22 years old.  Skin check.  Lung cancer screening. You may have this screening every year starting at age 61 if you have a 30-pack-year history of smoking and currently smoke or have quit within the past 15 years.  Fecal occult blood test (FOBT) of the stool. You may have this test every year starting at age 84.  Flexible sigmoidoscopy or colonoscopy. You may have a sigmoidoscopy every 5 years or a colonoscopy every 10 years starting at age 83.  Hepatitis C blood test.  Hepatitis B blood test.  Sexually transmitted disease (STD) testing.  Diabetes screening. This is done by checking your blood sugar (glucose) after you have not eaten for a while (fasting). You may have this done every 1-3 years.  Bone density scan. This is done to screen for osteoporosis. You may have this done starting at age 69.  Mammogram. This may be done every 1-2 years. Talk to your health care provider about how often you should have regular mammograms. Talk with your health care provider about your test results, treatment options, and if necessary, the need  for more tests. Vaccines  Your health care provider may recommend certain vaccines, such as:  Influenza vaccine. This is recommended every year.  Tetanus, diphtheria, and acellular pertussis (Tdap, Td) vaccine. You may need a Td booster every 10 years.  Zoster vaccine. You may need this after age 26.  Pneumococcal 13-valent conjugate (PCV13) vaccine. One dose is recommended after age 28.  Pneumococcal polysaccharide (PPSV23) vaccine. One dose is recommended after age 72. Talk to your health care provider about which screenings and vaccines you need and how often you need them. This information is not intended to replace advice given to you by your health care provider. Make sure you discuss any questions you have with your health care provider. Document Released: 09/04/2015 Document Revised: 04/27/2016 Document Reviewed: 06/09/2015 Elsevier Interactive Patient Education  2017 [employer] Prevention in the Home Falls can cause injuries. They can happen to people of all ages. There are many things you can do to make your home safe and to help prevent falls. What can I do on the outside of my home?  Regularly fix the edges of walkways and driveways and fix any cracks.  Remove anything that might make you trip as you walk through a door, such as a raised step or threshold.  Trim any bushes or trees on the path to your home.  Use bright outdoor lighting.  Clear any walking paths of anything that might make someone trip, such as rocks or tools.  Regularly check to see if handrails are loose or broken. Make sure that both sides of any steps have handrails.  Any raised decks and porches should have guardrails on the edges.  Have any leaves, snow, or ice cleared regularly.  Use sand or salt on walking paths during winter.  Clean up any spills in your garage right away. This includes oil or grease spills. What can I do in the bathroom?  Use night lights.  Install grab bars  by the toilet and in the tub and shower. Do not use towel bars as grab bars.  Use non-skid mats or decals in the tub or shower.  If you need to sit down in the shower, use a plastic, non-slip stool.  Keep the floor dry. Clean up any water that spills on the floor as soon as it happens.  Remove soap buildup in the tub or shower regularly.  Attach bath mats securely with double-sided non-slip rug tape.  Do not have throw rugs and other things on the floor that can make you trip. What can I do in the bedroom?  Use night lights.  Make sure that you have a light by your bed that is easy to reach.  Do not use any sheets or blankets that are too big for your bed. They should not hang down onto the floor.  Have a firm chair that has side arms. You can use this for support while you get dressed.  Do not have throw rugs and other things on the floor that can make you trip. What can I do in the kitchen?  Clean up any spills right away.  Avoid walking on wet floors.  Keep items that you use a lot in easy-to-reach places.  If you need to reach  something above you, use a strong step stool that has a grab bar.  Keep electrical cords out of the way.  Do not use floor polish or wax that makes floors slippery. If you must use wax, use non-skid floor wax.  Do not have throw rugs and other things on the floor that can make you trip. What can I do with my stairs?  Do not leave any items on the stairs.  Make sure that there are handrails on both sides of the stairs and use them. Fix handrails that are broken or loose. Make sure that handrails are as long as the stairways.  Check any carpeting to make sure that it is firmly attached to the stairs. Fix any carpet that is loose or worn.  Avoid having throw rugs at the top or bottom of the stairs. If you do have throw rugs, attach them to the floor with carpet tape.  Make sure that you have a light switch at the top of the stairs and the  bottom of the stairs. If you do not have them, ask someone to add them for you. What else can I do to help prevent falls?  Wear shoes that:  Do not have high heels.  Have rubber bottoms.  Are comfortable and fit you well.  Are closed at the toe. Do not wear sandals.  If you use a stepladder:  Make sure that it is fully opened. Do not climb a closed stepladder.  Make sure that both sides of the stepladder are locked into place.  Ask someone to hold it for you, if possible.  Clearly mark and make sure that you can see:  Any grab bars or handrails.  First and last steps.  Where the edge of each step is.  Use tools that help you move around (mobility aids) if they are needed. These include:  Canes.  Walkers.  Scooters.  Crutches.  Turn on the lights when you go into a dark area. Replace any light bulbs as soon as they burn out.  Set up your furniture so you have a clear path. Avoid moving your furniture around.  If any of your floors are uneven, fix them.  If there are any pets around you, be aware of where they are.  Review your medicines with your doctor. Some medicines can make you feel dizzy. This can increase your chance of falling. Ask your doctor what other things that you can do to help prevent falls. This information is not intended to replace advice given to you by your health care provider. Make sure you discuss any questions you have with your health care provider. Document Released: 06/04/2009 Document Revised: 01/14/2016 Document Reviewed: 09/12/2014 Elsevier Interactive Patient Education  2017 [employer].

## 2019-11-12 ENCOUNTER — Other Ambulatory Visit: Payer: Self-pay | Admitting: Family Medicine

## 2019-11-13 ENCOUNTER — Other Ambulatory Visit: Payer: Self-pay | Admitting: Family Medicine

## 2019-11-13 DIAGNOSIS — R809 Proteinuria, unspecified: Secondary | ICD-10-CM

## 2019-11-13 DIAGNOSIS — E1329 Other specified diabetes mellitus with other diabetic kidney complication: Secondary | ICD-10-CM

## 2019-11-13 NOTE — Telephone Encounter (Signed)
Refill request for general medication.  Last office visit:06/12/19  No follow-ups on file.  Component 7 mo ago  Hemoglobin A1C 6.6

## 2019-11-14 ENCOUNTER — Ambulatory Visit (INDEPENDENT_AMBULATORY_CARE_PROVIDER_SITE_OTHER): Payer: Medicare HMO | Admitting: Family Medicine

## 2019-11-14 ENCOUNTER — Encounter: Payer: Self-pay | Admitting: Family Medicine

## 2019-11-14 ENCOUNTER — Other Ambulatory Visit: Payer: Self-pay

## 2019-11-14 VITALS — BP 136/64 | HR 77 | Temp 98.1°F | Resp 14 | Ht 65.0 in | Wt 182.4 lb

## 2019-11-14 DIAGNOSIS — E1129 Type 2 diabetes mellitus with other diabetic kidney complication: Secondary | ICD-10-CM

## 2019-11-14 DIAGNOSIS — R109 Unspecified abdominal pain: Secondary | ICD-10-CM

## 2019-11-14 DIAGNOSIS — Z5181 Encounter for therapeutic drug level monitoring: Secondary | ICD-10-CM

## 2019-11-14 DIAGNOSIS — R61 Generalized hyperhidrosis: Secondary | ICD-10-CM

## 2019-11-14 DIAGNOSIS — R809 Proteinuria, unspecified: Secondary | ICD-10-CM

## 2019-11-14 DIAGNOSIS — I10 Essential (primary) hypertension: Secondary | ICD-10-CM | POA: Diagnosis not present

## 2019-11-14 DIAGNOSIS — F430 Acute stress reaction: Secondary | ICD-10-CM

## 2019-11-14 DIAGNOSIS — N390 Urinary tract infection, site not specified: Secondary | ICD-10-CM

## 2019-11-14 DIAGNOSIS — R002 Palpitations: Secondary | ICD-10-CM | POA: Diagnosis not present

## 2019-11-14 DIAGNOSIS — G8929 Other chronic pain: Secondary | ICD-10-CM

## 2019-11-14 DIAGNOSIS — E7800 Pure hypercholesterolemia, unspecified: Secondary | ICD-10-CM

## 2019-11-14 DIAGNOSIS — F419 Anxiety disorder, unspecified: Secondary | ICD-10-CM

## 2019-11-14 DIAGNOSIS — R519 Headache, unspecified: Secondary | ICD-10-CM

## 2019-11-14 MED ORDER — SUMATRIPTAN SUCCINATE 50 MG PO TABS: 50.0000 mg | ORAL_TABLET | ORAL | 2 refills | Status: DC | PRN

## 2019-11-14 MED ORDER — CITALOPRAM HYDROBROMIDE 10 MG PO TABS: 10.0000 mg | ORAL_TABLET | Freq: Every day | ORAL | 1 refills | Status: DC

## 2019-11-14 NOTE — Patient Instructions (Signed)
Try imitrex to get rid of your headache Can also try excedrine migraine  Imitrex.  Can use 25-100 mg by mouth once at the onset of headache, and if it doesn't resolve can take a second dose one hour later.  Max dose in 24 hours is 200 mg  Start celexa daily for nerves/stress/and some of you physical symptoms, I think they will improve  Stop propanolol I'm sending in a lower dose for you to try

## 2019-11-14 NOTE — Progress Notes (Signed)
Patient ID: Lori Potts, female    DOB: 26-Apr-1946, 74 y.o.   MRN: [id number]  PCP: Delsa Grana, PA-C  Chief Complaint  Patient presents with  . Excessive Sweating    / nerves?  . Diabetes    [id number]  . Medication Refill    Subjective:   Lori Potts is a 74 y.o. female, presents to clinic with CC of the following:  She recently had UTI on 10/22/2019.  Culture was positive for E.coli, in was pansensitive.  She was treated with cipro x 5 d.  She presented on 10/22/2019 with flank pain which was thought to be more related to chronic back pain.  Pt continues to endorse same flank/back pain, no urine odor, hematuria, urinary frequency/urgency, abd or suprapubic pain.  She is concerned with frequent sweats (not new) that she feels are worse.  She has not have fever, N, V, D, change in appetite.    She is having severe "nerves" and anxiety she is not sure if sweats are worse due to this, she feels more on edge, she's been told to stop watching the news and she has significant family stress from multiple sources with too many people expecting a lot from her.  Re: sweats - she reports they started in her 40's and she never had menopausal sx like that 20 years.      Diabetes Mellitus Type II: Currently managing with trulicity  Pt notes good med compliance Pt has no SE from meds. Blood sugars running 110-130's No hypoglycemic episodes Denies: Polyuria, polydipsia, polyphagia, vision changes, or neuropathy Recent pertinent labs: Lab Results  Component Value Date   HGBA1C 6.6 03/31/2019   HGBA1C 6.3 (H) 05/09/2018   HGBA1C 6.5 (H) 01/09/2018    Pt is UTD on DM foot exam and due for eye exam ACEI/ARB: Yes Statin: Yes On 81 mg ASA  Hypertension:  Currently managed on losartan 25 mg Pt reports good med compliance and denies any SE.  No lightheadedness, hypotension, syncope. Blood pressure today is well controlled for age BP Readings from Last 3 Encounters:  11/14/19 136/64    11/05/19 138/64  10/22/19 134/84  Pt denies CP, SOB, exertional sx, LE edema,  visual disturbances Reports HA's (migraine and chronic)  Hyperlipidemia: Last labs cholesterol was poorly controlled on lipitor 80 mg daily, ASCVD was very high 35.7%, changed to crestor 6 months ago. Current Medication Regimen:crestor 40 mg, good compliance no myalgias Last Lipids: Lab Results  Component Value Date   CHOL 210 (H) 05/14/2019   HDL 57 05/14/2019   LDLCALC 128 (H) 05/14/2019   TRIG 131 05/14/2019   CHOLHDL 3.7 05/14/2019  - Denies: Chest pain, shortness of breath, myalgias.  She complains of worse HA, and doesn't know why, she doesn't think its her sinuses.  She has hx of migraines, last time I saw her she had similar complaints.  She previously was going to ENT for sinus and allergies.  Stress is a trigger and she has increased stress.  Prior management primarily includes getting Toradol injections and using phenergan and tylenol.  Pt endorses again palpitations intermittently, hot flashes sweats and increased HA's. Last visit with me (first visit) was sept 2020, we did check thyroid function and it was normal.  She tried trial of propranolol, and palpitations improved but she did feel some fatigue and had some DOE as a SE.     Cardiac Cath with Dr. Clayborn Bigness 09/10/2018 after multiple visits for angina and SOB, she  had normal LVF, normal wall motion, LVEF 60%, possible small vessel disease -medical management. Prior NM myocardioal perfusion study 05/25/2018 LVEF was 25-35% study had a lot of artifact - reported "borderline myocardial perfusion scan evidence of depressed left ventricular function.Marland Kitchen less than adequate study with evidence of artifact and sign gut uptake "  Prior back surgery?  Cannot find old records of specialist in [facility] or through care everywhere - but chart notes hx of spinal cord stimulator? - pt is seeing neurosurgery, Dr. Annette Stable for lumbar radiculopathy - but encounters/OV  notes not visible, also encounters with Chiropractic Medicine  Patient Active Problem List   Diagnosis Date Noted  . 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 Microalbuminuria 01/22/2018  . Anemia 09/22/2017  . S/P knee replacement 07/28/2017  . Allergic rhinitis 05/26/2017  . Vitamin D deficiency 05/19/2017  . Chronic nonintractable headache 05/18/2017  . Special screening for malignant neoplasms, colon   . Gastroesophageal reflux disease   . Gastritis without bleeding   . 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 Insomnia 11/29/2016  . 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 Vasomotor flushing 04/19/2016  . Abdominal wall pain in right flank 03/29/2016  . Diverticulosis 02/24/2016  . Hypercholesterolemia 06/16/2015  . FHx: migraine headaches 04/13/2015  . Chronic pain 04/13/2015  . Neck pain 02/26/2015  . Status post lumbar surgery 02/26/2015  . Abnormal ECG 01/27/2015  . Angina pectoris ([facility]) 01/27/2015  . 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[facility]) 01/27/2015      Current Outpatient Medications:  .  albuterol (VENTOLIN HFA) 108 (90 Base) MCG/ACT inhaler, Inhale 1 puff into the lungs every 6 (six) hours as needed for wheezing or shortness of breath., Disp: 18 g, Rfl: 2 .  aspirin EC 81 MG tablet, Take 81 mg by mouth daily., Disp: , Rfl:  .  budesonide-formoterol (SYMBICORT) 160-4.5 MCG/ACT inhaler, Inhale 2 puffs into the lungs 2 (two) times daily., Disp: 1 Inhaler, Rfl: 3 .  Dulaglutide (TRULICITY) 4.33 IR/5.1OA SOPN, Inject 0.75 mg into the skin once a week., Disp: 8 pen, Rfl: 1 .  ipratropium-albuterol (DUONEB) 0.5-2.5 (3) MG/3ML SOLN, CAN TAKE 3ML EVERY 20 MINUTES FOR 2 DOSES AS NEEDED FOR WHEEZING, Disp: 90 mL, Rfl: 0 .  levocetirizine (XYZAL) 5 MG tablet, Take 1 tablet (5 mg total) by mouth every evening., Disp: 30 tablet, Rfl: 2 .  losartan (COZAAR) 25 MG tablet, TAKE 1 TABLET EVERY  DAY, Disp: 90 tablet, Rfl: 0 .  oxyCODONE-acetaminophen (PERCOCET) 10-325 MG tablet, Take 1 tablet by mouth 5 (five) times daily. , Disp: , Rfl: 0 .  propranolol ER (INDERAL LA) 60 MG 24 hr capsule, Take 1 capsule (60 mg total) by mouth daily., Disp: 90 capsule, Rfl: 3 .  rosuvastatin (CRESTOR) 40 MG tablet, Take 1 tablet (40 mg total) by mouth daily., Disp: 90 tablet, Rfl: 3 .  sodium chloride (OCEAN) 0.65 % SOLN nasal spray, Place 1 spray into both nostrils 2 (two) times daily as needed for congestion., Disp: 60 mL, Rfl: 5 .  Alcohol Swabs (B-D SINGLE USE SWABS REGULAR) PADS, 1 application by Does not apply route 2 (two) times daily., Disp: 90 each, Rfl: 0 .  Blood Glucose Monitoring Suppl (TRUE METRIX METER) w/Device KIT, Use to check fingerstick blood sugars once a day; LON 99 months; E11.9, Disp: 1 kit, Rfl: 0 .  guaiFENesin (MUCINEX) 600 MG 12 hr tablet, Take 1 tablet (600 mg total) by mouth 2 (  two) times daily. (Patient not taking: Reported on 11/14/2019), Disp: 20 tablet, Rfl: 0 .  Insulin Pen Needle (BD ULTRA-FINE PEN NEEDLES) 29G X 12.7MM MISC, For use with injectable GLP-1 inhibitor, Victoza, once a day, Disp: 100 each, Rfl: 3 .  Lancets (ACCU-CHEK MULTICLIX) lancets, Use as instructed, Disp: 100 each, Rfl: 12 .  TRUE METRIX BLOOD GLUCOSE TEST test strip, TEST BLOOD SUGAR ONE TIME DAILY, Disp: 100 strip, Rfl: 2   Allergies  Allergen Reactions  . Metformin And Related Diarrhea  . Adhesive [Tape] Rash and Other (See Comments)    Regular tape is ok, allergy is to paper tape     Family History  Problem Relation Age of Onset  . Heart attack Mother   . Sarcoidosis Sister      Social History   Socioeconomic History  . Marital status: Married    Spouse name: Gwyndolyn Saxon  . Number of children: 3  . Years of education: Not on file  . Highest education level: Associate degree: occupational, Hotel manager, or vocational program  Occupational History  . Not on file  Tobacco Use  . Smoking  status: Former Smoker    Types: Cigarettes    Quit date: 2006    Years since quitting: 15.2  . Smokeless tobacco: Never Used  . Tobacco comment: quit smoking 62yr ago  Substance and Sexual Activity  . Alcohol use: No  . Drug use: No  . Sexual activity: Yes    Partners: Male    Birth control/protection: Surgical  Other Topics Concern  . Not on file  Social History Narrative  . Not on file   Social Determinants of Health   Financial Resource Strain: Low Risk   . Difficulty of Paying Living Expenses: Not hard at all  Food Insecurity: No Food Insecurity  . Worried About RCharity fundraiserin the Last Year: Never true  . Ran Out of Food in the Last Year: Never true  Transportation Needs: No Transportation Needs  . Lack of Transportation (Medical): No  . Lack of Transportation (Non-Medical): No  Physical Activity: Inactive  . Days of Exercise per Week: 0 days  . Minutes of Exercise per Session: 0 min  Stress: No Stress Concern Present  . Feeling of Stress : Not at all  Social Connections: Not Isolated  . Frequency of Communication with Friends and Family: More than three times a week  . Frequency of Social Gatherings with Friends and Family: Twice a week  . Attends Religious Services: More than 4 times per year  . Active Member of Clubs or Organizations: Yes  . Attends CArchivistMeetings: 1 to 4 times per year  . Marital Status: Married  IHuman resources officerViolence: Not At Risk  . Fear of Current or Ex-Partner: No  . Emotionally Abused: No  . Physically Abused: No  . Sexually Abused: No    Chart Review Today: I personally reviewed active problem list, medication list, allergies, family history, social history, health maintenance, notes from last encounter, lab results, imaging with the patient/caregiver today. Last acute ov, micro, labs reviewed.  Further chart review done day of visit - pt new to me as of Sept 2020, she has been seen for sick visits by other  providers in clinic since then, but it has been 6 months since we did routine f/up and labs.  With multiple complaints reviewed farther back in chart/problem list/meds etc.  Hx of sweats and "flushing", no past thyroid labs abnormal, long hx  of HA's that are noted to be chronic as well.    Review of Systems 10 Systems reviewed and are negative for acute change except as noted in the HPI.     Objective:   Vitals:   11/14/19 1135  BP: 136/64  Pulse: 77  Resp: 14  Temp: 98.1 F (36.7 C)  SpO2: 94%  Weight: 182 lb 6.4 oz (82.7 kg)  Height: '5\' 5"'$  (1.651 m)    Body mass index is 30.35 kg/m.  Physical Exam Vitals and nursing note reviewed.  Constitutional:      General: She is not in acute distress.    Appearance: Normal appearance. She is well-developed. She is not ill-appearing, toxic-appearing or diaphoretic.     Interventions: Face mask in place.  HENT:     Head: Normocephalic and atraumatic.     Right Ear: External ear normal.     Left Ear: External ear normal.  Eyes:     General: Lids are normal. No scleral icterus.       Right eye: No discharge.        Left eye: No discharge.     Conjunctiva/sclera: Conjunctivae normal.  Neck:     Trachea: Phonation normal. No tracheal deviation.  Cardiovascular:     Rate and Rhythm: Normal rate and regular rhythm.     Pulses: Normal pulses.          Radial pulses are 2+ on the right side and 2+ on the left side.       Posterior tibial pulses are 2+ on the right side and 2+ on the left side.     Heart sounds: Normal heart sounds. No murmur. No friction rub. No gallop.   Pulmonary:     Effort: Pulmonary effort is normal. No respiratory distress.     Breath sounds: Normal breath sounds. No stridor. No wheezing, rhonchi or rales.  Chest:     Chest wall: No tenderness.  Abdominal:     General: Bowel sounds are normal. There is no distension.     Palpations: Abdomen is soft.     Tenderness: There is no abdominal tenderness. There is no  guarding or rebound.  Musculoskeletal:        General: No deformity. Normal range of motion.     Cervical back: Normal range of motion and neck supple. No rigidity or tenderness.     Right lower leg: No edema.     Left lower leg: No edema.  Lymphadenopathy:     Cervical: No cervical adenopathy.  Skin:    General: Skin is warm and dry.     Capillary Refill: Capillary refill takes less than 2 seconds.     Coloration: Skin is not jaundiced or pale.     Findings: No rash.  Neurological:     Mental Status: She is alert and oriented to person, place, and time.     Motor: No abnormal muscle tone.     Gait: Gait normal.     Comments: LANG/SPEECH: speech clear/fluent, no dysarthria, answers questions appropriately  CRANIAL NERVES:   II: Pupils equal and reactive, no RAPD   III, IV, VI: EOM intact, no gaze preference or deviation, no nystagmus.   V: normal sensation in V1, V2, and V3 segments bilaterally   VII: no asymmetry, no nasolabial fold flattening   VIII: normal hearing to speech   IX, X: normal palatal elevation, no uvular deviation   XI: 5/5 head turn and 5/5 shoulder shrug bilaterally  XII: midline tongue protrusion  MOTOR:  5/5 bilateral grip strength 5/5 strength dorsiflexion/plantarflexion b/l  SENSORY:  Normal to light touch Romberg absent  COORD: Normal finger to nose, no tremor, no dysmetria  STATION: normal stance, no truncal ataxia  GAIT: Normal  Psychiatric:        Mood and Affect: Mood is anxious.        Speech: Speech normal.        Behavior: Behavior normal.            Assessment & Plan:    1. Flank pain Complains of flank/back pain continued after tx of UTI, may be MSK, recheck urine, will tx if UA/micro or culture suggest UTI or culture + with continued sx. - Urinalysis, Routine w reflex microscopic - Urine Culture - MICROSCOPIC MESSAGE  2. Palpitations Propranolol helped with her sx, but sounds like it may have been too strong with BB SE  - discussed decreased dose but no XL capsule in less than 60 mg - will try lower dose BID - TID for palpitations and hope it will help with HA as well. - CBC with Differential/Platelet - COMPLETE METABOLIC PANEL WITH GFR - TSH - Urinalysis, Routine w reflex microscopic - Urine Culture  3. Diaphoresis Pt is very concerned about sweats and hot flashes which she reports is new - recheck thyroid? R/o any leukocytosis or WBC abnormality suggestive of infection.  No change to pulmonary sx (asthma) and no lymphadenopathy or unintentional weight loss.  Could refer to endo with constipation of reported sx - sweats, palpitations, HA's - doesn't really seem like thyroid dysfunction, but may be appropriate to r/o other etiologies - although pt reports everything is new, her chart has hx of many of her sx, but if she is correct would want to eval catecholamines/pheo? Other endocrine disorders - CBC with Differential/Platelet - TSH - Urinalysis, Routine w reflex microscopic - Urine Culture  4. Urinary tract infection without hematuria, site unspecified Recheck, no abd ttp, no CVA tenderness - Urinalysis, Routine w reflex microscopic - Urine Culture - MICROSCOPIC MESSAGE  5. 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   - COMPLETE METABOLIC PANEL WITH GFR - Hemoglobin A1c - Urinalysis, Routine w reflex microscopic  6. Essential hypertension Well controlled today for age, prior to a few months ago was unstable and uncontrolled.  Continue Losartan and change propranolol from XR 60 mg to 10 mg TID (see if she tolerates better)  - COMPLETE METABOLIC PANEL WITH GFR  7. Hypercholesterolemia Compliant with meds, no SE, no myalgias, fatigue or jaundice Due for FLP and recheck CMP - COMPLETE METABOLIC PANEL WITH GFR - Lipid panel  8. Encounter for medication monitoring - CBC with Differential/Platelet - COMPLETE METABOLIC PANEL WITH  GFR - Lipid panel - TSH - Hemoglobin A1c  9. Acute stress reaction She has increased stress and anxiety, several situations with family and at home or causing worse sx, strongly encouraged her to try celexa for 3 months, work on coping skills/techniques, seek out therapy, avoid news, set healthy boundaries with friends and family  - citalopram (CELEXA) 10 MG tablet; Take 1 tablet (10 mg total) by mouth daily. For nerves, stress, anxiety symptoms  Dispense: 90 tablet; Refill: 1  10. Anxiety Same as above - citalopram (CELEXA) 10 MG tablet; Take 1 tablet (10 mg total) by mouth daily. For nerves, stress, anxiety symptoms  Dispense: 90 tablet; Refill: 1  11. Chronic nonintractable  headache, unspecified headache type Long hx documented of HA's/migraine syndrome, she usually comes to the office for toradol injection and takes phenergan and tylenol at home, increase stress and triggers.  Trial of lower dose propranolol and imitrex, f/up soon for recheck  - SUMAtriptan (IMITREX) 50 MG tablet; Take 1-2 tablets (50-100 mg total) by mouth every 2 (two) hours as needed for migraine (max dose 200 in 24 h). May repeat in 2 hours if headache persists or recurs.  Dispense: 10 tablet; Refill: 2   Pt previously was dx by Dr. Manuella Ghazi with vasomotor flushing - Unclear if she went to neuro or endo in the past?   Some sx suggestive of hyperthyroid, but last TSH was normal will repeat today.    Reviewed older chart records from 2017: Hot flashes reported at that time, "Hot Flashes: Pt. Reports having hot flashes for many years, started after she turned 50 and progressed to menopause. She feels sweaty, hot, and irritable. Cannot take an HRT."  Although our last two visits pt has endorsed several sx as new, there is documentation to the contrary.  We can still refer to endo for further work up if needed.  Will continue to monitor   Delsa Grana, PA-C 11/14/19 11:47 AM

## 2019-11-15 LAB — URINALYSIS, ROUTINE W REFLEX MICROSCOPIC
Bacteria, UA: NONE SEEN /HPF
Bilirubin Urine: NEGATIVE
Glucose, UA: NEGATIVE
Hgb urine dipstick: NEGATIVE
Hyaline Cast: NONE SEEN /LPF
Ketones, ur: NEGATIVE
Leukocytes,Ua: NEGATIVE
Nitrite: NEGATIVE
RBC / HPF: NONE SEEN /HPF (ref 0–2)
Specific Gravity, Urine: 1.015 (ref 1.001–1.03)
pH: 6 (ref 5.0–8.0)

## 2019-11-15 LAB — COMPLETE METABOLIC PANEL WITH GFR
AG Ratio: 1.9 (calc) (ref 1.0–2.5)
ALT: 18 U/L (ref 6–29)
AST: 17 U/L (ref 10–35)
Albumin: 4.4 g/dL (ref 3.6–5.1)
Alkaline phosphatase (APISO): 84 U/L (ref 37–153)
BUN: 22 mg/dL (ref 7–25)
CO2: 28 mmol/L (ref 20–32)
Calcium: 9.5 mg/dL (ref 8.6–10.4)
Chloride: 109 mmol/L (ref 98–110)
Creat: 0.89 mg/dL (ref 0.60–0.93)
GFR, Est African American: 75 mL/min/{1.73_m2} (ref >=60)
GFR, Est Non African American: 64 mL/min/{1.73_m2} (ref >=60)
Globulin: 2.3 g/dL (calc) (ref 1.9–3.7)
Glucose, Bld: 111 mg/dL — ABNORMAL HIGH (ref 65–99)
Potassium: 3.7 mmol/L (ref 3.5–5.3)
Sodium: 144 mmol/L (ref 135–146)
Total Bilirubin: 0.5 mg/dL (ref 0.2–1.2)
Total Protein: 6.7 g/dL (ref 6.1–8.1)

## 2019-11-15 LAB — CBC WITH DIFFERENTIAL/PLATELET
Absolute Monocytes: 380 cells/uL (ref 200–950)
Basophils Absolute: 30 cells/uL (ref 0–200)
Basophils Relative: 0.6 %
Eosinophils Absolute: 130 cells/uL (ref 15–500)
Eosinophils Relative: 2.6 %
HCT: 40.3 % (ref 35.0–45.0)
Hemoglobin: 12.9 g/dL (ref 11.7–15.5)
Lymphs Abs: 1220 cells/uL (ref 850–3900)
MCH: 25.4 pg — ABNORMAL LOW (ref 27.0–33.0)
MCHC: 32 g/dL (ref 32.0–36.0)
MCV: 79.5 fL — ABNORMAL LOW (ref 80.0–100.0)
MPV: 10.4 fL (ref 7.5–12.5)
Monocytes Relative: 7.6 %
Neutro Abs: 3240 cells/uL (ref 1500–7800)
Neutrophils Relative %: 64.8 %
Platelets: 396 10*3/uL (ref 140–400)
RBC: 5.07 10*6/uL (ref 3.80–5.10)
RDW: 15.1 % — ABNORMAL HIGH (ref 11.0–15.0)
Total Lymphocyte: 24.4 %
WBC: 5 10*3/uL (ref 3.8–10.8)

## 2019-11-15 LAB — URINE CULTURE
MICRO NUMBER:: 10294512
SPECIMEN QUALITY:: ADEQUATE

## 2019-11-15 LAB — LIPID PANEL
Cholesterol: 164 mg/dL (ref <200)
HDL: 52 mg/dL (ref >=50)
LDL Cholesterol (Calc): 93 mg/dL (calc)
Non-HDL Cholesterol (Calc): 112 mg/dL (calc) (ref <130)
Total CHOL/HDL Ratio: 3.2 (calc) (ref <5.0)
Triglycerides: 93 mg/dL (ref <150)

## 2019-11-15 LAB — HEMOGLOBIN A1C
Hgb A1c MFr Bld: 7.2 % of total Hgb — ABNORMAL HIGH (ref <5.7)
Mean Plasma Glucose: 160 (calc)
eAG (mmol/L): 8.9 (calc)

## 2019-11-15 LAB — TSH: TSH: 0.71 mIU/L (ref 0.40–4.50)

## 2019-11-19 MED ORDER — PROPRANOLOL HCL 10 MG PO TABS: 10.0000 mg | ORAL_TABLET | Freq: Three times a day (TID) | ORAL | 2 refills | Status: DC

## 2019-11-26 DIAGNOSIS — M5416 Radiculopathy, lumbar region: Secondary | ICD-10-CM | POA: Diagnosis not present

## 2019-12-18 ENCOUNTER — Telehealth: Payer: Self-pay | Admitting: Family Medicine

## 2019-12-18 NOTE — Chronic Care Management (AMB) (Signed)
  Care Management   Note  12/18/2019 Name: CHERRILL SCRIMA MRN: [id number] DOB: [birth year]  BENTLY MORATH is a 74 y.o. year old female who is a primary care patient of Sander Radon and is actively engaged with the care management team. I reached out to [name] by phone today to assist with scheduling an initial visit with the Pharmacist in response to referral sent by [employer] health plan.  Follow up plan: Telephone appointment with care management team member scheduled for: 01/02/2020.  Gwenevere Ghazi  Care Guide, Embedded Care Coordination [facility]  [place], [place] [address] Direct Dial: [phone] [email or web address] Website: [email or web address]

## 2019-12-26 DIAGNOSIS — M5416 Radiculopathy, lumbar region: Secondary | ICD-10-CM | POA: Diagnosis not present

## 2019-12-26 DIAGNOSIS — G8929 Other chronic pain: Secondary | ICD-10-CM | POA: Insufficient documentation

## 2019-12-26 DIAGNOSIS — M25552 Pain in left hip: Secondary | ICD-10-CM | POA: Insufficient documentation

## 2020-01-02 ENCOUNTER — Ambulatory Visit: Payer: Medicare HMO | Admitting: Pharmacist

## 2020-01-02 ENCOUNTER — Other Ambulatory Visit: Payer: Self-pay

## 2020-01-02 DIAGNOSIS — J309 Allergic rhinitis, unspecified: Secondary | ICD-10-CM

## 2020-01-02 DIAGNOSIS — R809 Proteinuria, unspecified: Secondary | ICD-10-CM

## 2020-01-02 DIAGNOSIS — E1129 Type 2 diabetes mellitus with other diabetic kidney complication: Secondary | ICD-10-CM

## 2020-01-02 NOTE — Chronic Care Management (AMB) (Signed)
Chronic Care Management Pharmacy  Name: Lori Potts  MRN: [id number] DOB: Dec 07, 1945  Chief Complaint/ HPI  Lori Potts,  74 y.o. , female presents for their Initial CCM visit with the clinical pharmacist via telephone due to COVID-19 Pandemic.  PCP : Delsa Grana, PA-C  Their chronic conditions include: DM, GERD, HLD, insomnia, chronic pain  Office Visits: 3/25 flank pain, [place], BP 136/64 P 77 Wt 182 BMI 30.4, frequent sweats, stress - start Celexa, not menopause like, LDL 128H on Crestor, dec propranolol DOE, start Imitrex, pain UTI related? 3/2 UTI, [place], BP 134/84 P 81, off propranolol, Cystitis?, Cipro 518m bid x 5d, opioid constipation  Consult Visit: NA  Medications: Outpatient Encounter Medications as of 01/02/2020  Medication Sig Note  . albuterol (VENTOLIN HFA) 108 (90 Base) MCG/ACT inhaler Inhale 1 puff into the lungs every 6 (six) hours as needed for wheezing or shortness of breath.   . Alcohol Swabs (B-D SINGLE USE SWABS REGULAR) PADS 1 application by Does not apply route 2 (two) times daily.   .Marland Kitchenaspirin EC 81 MG tablet Take 81 mg by mouth daily.   . Blood Glucose Monitoring Suppl (TRUE METRIX METER) w/Device KIT Use to check fingerstick blood sugars once a day; LON 99 months; E11.9   . citalopram (CELEXA) 10 MG tablet Take 1 tablet (10 mg total) by mouth daily. For nerves, stress, anxiety symptoms   . Insulin Pen Needle (BD ULTRA-FINE PEN NEEDLES) 29G X 12.7MM MISC For use with injectable GLP-1 inhibitor, Victoza, once a day   . ipratropium-albuterol (DUONEB) 0.5-2.5 (3) MG/3ML SOLN CAN TAKE 3ML EVERY 20 MINUTES FOR 2 DOSES AS NEEDED FOR WHEEZING   . Lancets (ACCU-CHEK MULTICLIX) lancets Use as instructed   . levocetirizine (XYZAL) 5 MG tablet Take 1 tablet (5 mg total) by mouth every evening.   .Marland Kitchenlosartan (COZAAR) 25 MG tablet TAKE 1 TABLET EVERY DAY   . oxyCODONE-acetaminophen (PERCOCET) 10-325 MG tablet Take 1 tablet by mouth 5 (five) times daily.   06/12/2019: prn  . rosuvastatin (CRESTOR) 40 MG tablet Take 1 tablet (40 mg total) by mouth daily.   . sodium chloride (OCEAN) 0.65 % SOLN nasal spray Place 1 spray into both nostrils 2 (two) times daily as needed for congestion.   . TRUE METRIX BLOOD GLUCOSE TEST test strip TEST BLOOD SUGAR ONE TIME DAILY   . budesonide-formoterol (SYMBICORT) 160-4.5 MCG/ACT inhaler Inhale 2 puffs into the lungs 2 (two) times daily. (Patient not taking: Reported on 01/02/2020)   . Dulaglutide (TRULICITY) 09.83MJA/2.5KNSOPN Inject 0.75 mg into the skin once a week. (Patient not taking: Reported on 01/02/2020)   . propranolol (INDERAL) 10 MG tablet Take 1 tablet (10 mg total) by mouth 3 (three) times daily. (Patient not taking: Reported on 01/02/2020)   . SUMAtriptan (IMITREX) 50 MG tablet Take 1-2 tablets (50-100 mg total) by mouth every 2 (two) hours as needed for migraine (max dose 200 in 24 h). May repeat in 2 hours if headache persists or recurs. (Patient not taking: Reported on 01/02/2020)    No facility-administered encounter medications on file as of 01/02/2020.     Current Diagnosis/Assessment:  Goals Addressed            This Visit's Progress   . Depression - goal therapeutic dose        CARE PLAN ENTRY (see longitudinal plan of care for additional care plan information)  Current Barriers:  . Patient with depression and multiple comorbidities including  diabetes and hyperlipidemia . Unclear connection between anxiety and diaphoresis   Pharmacist Clinical Goal(s):  Marland Kitchen Over the next 90 days, patient will work with PharmD and provider towards management of depression  Interventions: . Comprehensive medication review performed; medication list updated in electronic medical record . Inter-disciplinary care team collaboration (see longitudinal plan of care) . Increased Celexa 39m daily  Patient Self Care Activities:  . Patient will take medications as prescribed . Patient will focus on improved  adherence by taking new dose for 6 weeks and reporting outcome to clinical pharmacy . Patient will observe improvement in sweating, if any, and report   Initial goal documentation     . Diabetes Mellitus - goal A1c < 7%       CARE PLAN ENTRY (see longitudinal plan of care for additional care plan information)  Current Barriers:  . Diabetes: type 2; complicated by chronic medical conditions including hyperlipidemia, chronic pain, financial strain Lab Results  Component Value Date   HGBA1C 7.2 (H) 11/14/2019 .   Lab Results  Component Value Date   CREATININE 0.89 11/14/2019   CREATININE 0.64 05/14/2019   CREATININE 0.58 07/09/2018 .   .Marland KitchenNo results found for: EGFR . Current antihyperglycemic regimen: Trulicity (out) . Denies hypoglycemic symptoms, including dizziness, lightheadedness, shaking, sweating . Denies hyperglycemic symptoms, including polyuria, polydipsia, polyphagia, nocturia, blurred vision, neuropathy . Current exercise: none . Current blood glucose readings: 119 - 130  Pharmacist Clinical Goal(s):  .Marland KitchenOver the next 90 days, patient will work with PharmD and primary care provider to address financial inability to access medications  Interventions: . Comprehensive medication review performed, medication list updated in electronic medical record . Inter-disciplinary care team collaboration (see longitudinal plan of care) . Patient assistance application for Trulicity  Patient Self Care Activities:  . Patient will check blood glucose over the next 30 days when sweating , document, and provide at future appointments . Patient will focus on medication adherence by providing financial information . Patient will take medications as prescribed . Patient will contact provider with any episodes of hypoglycemia . Patient will report any questions or concerns to provider   Initial goal documentation     . Medication Management - goal patient assistance        CARE PLAN  ENTRY (see longitudinal plan of care for additional care plan information)  Current Barriers:  . Polypharmacy; complex patient with multiple comorbidities including diabetes, hyperlipidemia, depression . Relies on samples and patient assistance programs   Pharmacist Clinical Goal(s):  .Marland KitchenOver the next 90 days, patient will work with PharmD and provider towards optimized medication management  Interventions: . Comprehensive medication review performed; medication list updated in electronic medical record . Inter-disciplinary care team collaboration (see longitudinal plan of care) . Requested samples from PCP for Trulicity, Imitrex, and Spiriva . Began patient assistance process for above meds   Patient Self Care Activities:  . Patient will take medications as prescribed . Patient will focus on improved adherence by providing financial documentation for patient assistance programs in the next 14 days  Initial goal documentation         Financial Resource Strain: High Risk  . Difficulty of Paying Living Expenses: Very hard   Diabetes   Recent Relevant Labs: Lab Results  Component Value Date/Time   HGBA1C 7.2 (H) 11/14/2019 12:06 PM   HGBA1C 6.6 03/31/2019 12:00 AM   HGBA1C 6.3 (H) 05/09/2018 09:50 AM   MICROALBUR 5.2 05/14/2019 12:00 AM   MICROALBUR 3.8  05/09/2018 09:50 AM   MICROALBUR 20 07/04/2016 10:36 AM     Checking BG: 3 -4 times daily  Recent FBG Readings: 119 - 130 Patient is currently uncontrolled on the following medications: Trulicity (ran out)   Last diabetic Foot exam:  Lab Results  Component Value Date/Time   HMDIABEYEEXA No Retinopathy 12/27/2016 12:00 AM    Last diabetic Eye exam: No results found for: HMDIABFOOTEX   We discussed:  Checks 3- 4 times daily Metformin not tolerated Getting steroid injections Out of Trulicity, previous pharmacist left before completing Patient lost weight on Trulicity Claims no difference with BG if sweating,  probably stress  Plan  Re-apply for Trulicity Patient check FSBG when sweating and report Continue current medications   Depression   Patient has failed these meds in past: None Patient is currently uncontrolled on the following medications: Celexa  We discussed: Celexa started around 3/25 Patient open to increasing Celexa to 50m daily Sweating that may be stress related Also TSH consisently low but not on Synthroid, potential hyperthyroid Potential for methimazole if T4 high  Plan  Increase Celexa 264m1 tab by mouth daily for depression Full thyroid panel  Hyperlipidemia   Lipid Panel     Component Value Date/Time   CHOL 164 11/14/2019 1206   TRIG 93 11/14/2019 1206   HDL 52 11/14/2019 1206   CHOLHDL 3.2 11/14/2019 1206   VLDL 18 07/04/2016 1016   LDSeneca3 11/14/2019 1206     The 10-year ASCVD risk score (Goff DC Jr., et al., 2013) is: 27.7%   Values used to calculate the score:     Age: 89ears     Sex: Female     Is Non-Hispanic African American: Yes     Diabetic: Yes     Tobacco smoker: No     Systolic Blood Pressure: 13300mHg     Is BP treated: Yes     HDL Cholesterol: 52 mg/dL     Total Cholesterol: 164 mg/dL   Patient has failed these meds in past: Lipitor (myalgias) Patient is currently uncontrolled on the following medications: Crestor 4039maily (not taking at time of FLP)  We discussed:  Patient taking Crestor daily supported by dispense hx Likely will be at goal next FLP  Plan  Continue current medications   Allergic rhinitis   Patient has failed these meds in past: NA Patient is currently uncontrolled on the following medications: Xyzal, Ocean, Ventolin, Duoneb  We discussed:  Getting treated for moderate persistant asthma with no dx Propranolol affects breathing, slows heart down and feel fatigued  Uncontrolled on Xyzal Ocean three times weekly Uses Ventolin every morning - can't afford - takes 1 puff Used Duoneb  Can't  afford Symbicort Takes Singulair 64m66mily (not on list)  Plan Diagnose with asthma if appropriate Add montelukast to medication list Use Ocean NS at least daily during pollen season Use Duoneb to save money and not ration Ventolin Use Spiriva after patient assitance  Medication Management   Pt uses [facility] all medications Uses pill box? No - states too many changes Pt endorses 80% compliance  We discussed:  Takes what she feels that she needs Oxycodone 5 times daily  Ran out of Imitrex, was effective, uses Tylenol SSN [id number] LIS $295/month, husband on dialysis, $190$3734rulicity - ask LeisKristeen Missut samples (needs Monday)  Spiriva - ask Leisa about maintenance inhaler  Plan  Imitrex refill PCP to provide Trulicity samples Patient  assistance for Trulicity and Spiriva Continue current medication management strategy  Singulair every day, update list  Follow up: 1 month phone visit  Milus Height, PharmD, [place], [facility] [phone]

## 2020-01-02 NOTE — Patient Instructions (Addendum)
Visit Information  Goals Addressed            This Visit's Progress   . Depression - goal therapeutic dose        CARE PLAN ENTRY (see longitudinal plan of care for additional care plan information)  Current Barriers:  . Patient with depression and multiple comorbidities including diabetes and hyperlipidemia . Unclear connection between anxiety and diaphoresis   Pharmacist Clinical Goal(s):  Marland Kitchen Over the next 90 days, patient will work with PharmD and provider towards management of depression  Interventions: . Comprehensive medication review performed; medication list updated in electronic medical record . Inter-disciplinary care team collaboration (see longitudinal plan of care) . Increased Celexa '20mg'$  daily  Patient Self Care Activities:  . Patient will take medications as prescribed . Patient will focus on improved adherence by taking new dose for 6 weeks and reporting outcome to clinical pharmacy . Patient will observe improvement in sweating, if any, and report   Initial goal documentation     . Diabetes Mellitus - goal A1c < 7%       CARE PLAN ENTRY (see longitudinal plan of care for additional care plan information)  Current Barriers:  . Diabetes: type 2; complicated by chronic medical conditions including hyperlipidemia, chronic pain, financial strain Lab Results  Component Value Date   HGBA1C 7.2 (H) 11/14/2019 .   Lab Results  Component Value Date   CREATININE 0.89 11/14/2019   CREATININE 0.64 05/14/2019   CREATININE 0.58 07/09/2018 .   Marland Kitchen No results found for: EGFR . Current antihyperglycemic regimen: Trulicity (out) . Denies hypoglycemic symptoms, including dizziness, lightheadedness, shaking, sweating . Denies hyperglycemic symptoms, including polyuria, polydipsia, polyphagia, nocturia, blurred vision, neuropathy . Current exercise: none . Current blood glucose readings: 119 - 130  Pharmacist Clinical Goal(s):  Marland Kitchen Over the next 90 days, patient will  work with PharmD and primary care provider to address financial inability to access medications  Interventions: . Comprehensive medication review performed, medication list updated in electronic medical record . Inter-disciplinary care team collaboration (see longitudinal plan of care) . Patient assistance application for Trulicity  Patient Self Care Activities:  . Patient will check blood glucose over the next 30 days when sweating , document, and provide at future appointments . Patient will focus on medication adherence by providing financial information . Patient will take medications as prescribed . Patient will contact provider with any episodes of hypoglycemia . Patient will report any questions or concerns to provider   Initial goal documentation     . Medication Management - goal patient assistance        CARE PLAN ENTRY (see longitudinal plan of care for additional care plan information)  Current Barriers:  . Polypharmacy; complex patient with multiple comorbidities including diabetes, hyperlipidemia, depression . Relies on samples and patient assistance programs   Pharmacist Clinical Goal(s):  Marland Kitchen Over the next 90 days, patient will work with PharmD and provider towards optimized medication management  Interventions: . Comprehensive medication review performed; medication list updated in electronic medical record . Inter-disciplinary care team collaboration (see longitudinal plan of care) . Requested samples from PCP for Trulicity, Imitrex, and Spiriva . Began patient assistance process for above meds   Patient Self Care Activities:  . Patient will take medications as prescribed . Patient will focus on improved adherence by providing financial documentation for patient assistance programs in the next 14 days  Initial goal documentation        Ms. Schindler was given information  about Chronic Care Management services today including:  1. CCM service includes  personalized support from designated clinical staff supervised by her physician, including individualized plan of care and coordination with other care providers 2. 24/7 contact phone numbers for assistance for urgent and routine care needs. 3. Standard insurance, coinsurance, copays and deductibles apply for chronic care management only during months in which we provide at least 20 minutes of these services. Most insurances cover these services at 100%, however patients may be responsible for any copay, coinsurance and/or deductible if applicable. This service may help you avoid the need for more expensive face-to-face services. 4. Only one practitioner may furnish and bill the service in a calendar month. 5. The patient may stop CCM services at any time (effective at the end of the month) by phone call to the office staff.  Patient agreed to services and verbal consent obtained.   Print copy of patient instructions provided.  Telephone follow up appointment with pharmacy team member scheduled for: 1 month  Milus Height, PharmD, [place], CTTS Clinical Pharmacist [facility] [phone] Allergic Rhinitis, Adult Allergic rhinitis is a reaction to allergens in the air. Allergens are tiny specks (particles) in the air that cause your body to have an allergic reaction. This condition cannot be passed from person to person (is not contagious). Allergic rhinitis cannot be cured, but it can be controlled. There are two types of allergic rhinitis:  Seasonal. This type is also called hay fever. It happens only during certain times of the year.  Perennial. This type can happen at any time of the year. What are the causes? This condition may be caused by:  Pollen from grasses, trees, and weeds.  House dust mites.  Pet dander.  Mold. What are the signs or symptoms? Symptoms of this condition include:  Sneezing.  Runny or stuffy nose (nasal congestion).  A lot of mucus in the  back of the throat (postnasal drip).  Itchy nose.  Tearing of the eyes.  Trouble sleeping.  Being sleepy during day. How is this treated? There is no cure for this condition. You should avoid things that trigger your symptoms (allergens). Treatment can help to relieve symptoms. This may include:  Medicines that block allergy symptoms, such as antihistamines. These may be given as a shot, nasal spray, or pill.  Shots that are given until your body becomes less sensitive to the allergen (desensitization).  Stronger medicines, if all other treatments have not worked. Follow these instructions at home: Avoiding allergens   Find out what you are allergic to. Common allergens include smoke, dust, and pollen.  Avoid them if you can. These are some of the things that you can do to avoid allergens: ? Replace carpet with wood, tile, or vinyl flooring. Carpet can trap dander and dust. ? Clean any mold found in the home. ? Do not smoke. Do not allow smoking in your home. ? Change your heating and air conditioning filter at least once a month. ? During allergy season:  Keep windows closed as much as you can. If possible, use air conditioning when there is a lot of pollen in the air.  Use a special filter for allergies with your furnace and air conditioner.  Plan outdoor activities when pollen counts are lowest. This is usually during the early morning or evening hours.  If you do go outdoors when pollen count is high, wear a special mask for people with allergies.  When you come indoors, take a  shower and change your clothes before sitting on furniture or bedding. General instructions  Do not use fans in your home.  Do not hang clothes outside to dry.  Wear sunglasses to keep pollen out of your eyes.  Wash your hands right away after you touch household pets.  Take over-the-counter and prescription medicines only as told by your doctor.  Keep all follow-up visits as told by your  doctor. This is important. Contact a doctor if:  You have a fever.  You have a cough that does not go away (is persistent).  You start to make whistling sounds when you breathe (wheeze).  Your symptoms do not get better with treatment.  You have thick fluid coming from your nose.  You start to have nosebleeds. Get help right away if:  Your tongue or your lips are swollen.  You have trouble breathing.  You feel dizzy or you feel like you are going to pass out (faint).  You have cold sweats. Summary  Allergic rhinitis is a reaction to allergens in the air.  This condition may be caused by allergens. These include pollen, dust mites, pet dander, and mold.  Symptoms include a runny, itchy nose, sneezing, or tearing eyes. You may also have trouble sleeping or feel sleepy during the day.  Treatment includes taking medicines and avoiding allergens. You may also get shots or take stronger medicines.  Get help if you have a fever or a cough that does not stop. Get help right away if you are short of breath. This information is not intended to replace advice given to you by your health care provider. Make sure you discuss any questions you have with your health care provider. Document Revised: 11/27/2018 Document Reviewed: 02/27/2018 Elsevier Patient Education  [place].

## 2020-01-09 DIAGNOSIS — M25552 Pain in left hip: Secondary | ICD-10-CM | POA: Diagnosis not present

## 2020-01-09 DIAGNOSIS — M545 Low back pain: Secondary | ICD-10-CM | POA: Diagnosis not present

## 2020-01-09 DIAGNOSIS — R262 Difficulty in walking, not elsewhere classified: Secondary | ICD-10-CM | POA: Diagnosis not present

## 2020-01-10 ENCOUNTER — Other Ambulatory Visit: Payer: Self-pay | Admitting: Family Medicine

## 2020-01-10 DIAGNOSIS — E1329 Other specified diabetes mellitus with other diabetic kidney complication: Secondary | ICD-10-CM

## 2020-01-10 DIAGNOSIS — R809 Proteinuria, unspecified: Secondary | ICD-10-CM

## 2020-01-15 DIAGNOSIS — M25552 Pain in left hip: Secondary | ICD-10-CM | POA: Diagnosis not present

## 2020-01-15 DIAGNOSIS — M545 Low back pain: Secondary | ICD-10-CM | POA: Diagnosis not present

## 2020-01-15 DIAGNOSIS — R262 Difficulty in walking, not elsewhere classified: Secondary | ICD-10-CM | POA: Diagnosis not present

## 2020-01-23 ENCOUNTER — Ambulatory Visit: Payer: Self-pay

## 2020-01-23 ENCOUNTER — Ambulatory Visit: Payer: Medicare HMO | Admitting: Orthopaedic Surgery

## 2020-01-23 ENCOUNTER — Other Ambulatory Visit: Payer: Self-pay

## 2020-01-23 DIAGNOSIS — M25552 Pain in left hip: Secondary | ICD-10-CM | POA: Diagnosis not present

## 2020-01-23 NOTE — Progress Notes (Signed)
Office Visit Note   Patient: Lori Potts           Date of Birth: 1945-12-24           MRN: [id number] Visit Date: 01/23/2020              Requested by: Delsa Grana, PA-C [address] [place],  [place] [address] PCP: Delsa Grana, PA-C   Assessment & Plan: Visit Diagnoses:  1. Pain in left hip     Plan: Impression is left hip pain that I feel is due to DJD.  I have recommended left hip cortisone injection with Dr. Junius Roads today.  Patient will follow up with [place] if she does not receive much relief from the injection today.  Questions encouraged and answered.  Follow-Up Instructions: Return if symptoms worsen or fail to improve.   Orders:  Orders Placed This Encounter  Procedures  . XR HIP UNILAT W OR W/O PELVIS 2-3 VIEWS LEFT  . US Guided Needle Placement - No Linked Charges   No orders of the defined types were placed in this encounter.     Procedures: No procedures performed   Clinical Data: No additional findings.   Subjective: Chief Complaint  Patient presents with  . Left Hip - Pain, Follow-up    Lori Potts is a 74 year old female comes in for evaluation of left hip and groin pain for many months that is getting worse.  Denies any definite injuries or trauma.  Endorses groin pain that radiates down to the thigh and sometimes into the shin area.  Denies any bowel or bladder dysfunction.  She does take Percocet from pain management.  She is limping due to the pain.  She has had spinal fusion surgery as well as a spinal cord stimulator placement.  The pain is worse with any movement or with sit to stand.   Review of Systems  Constitutional: Negative.   HENT: Negative.   Eyes: Negative.   Respiratory: Negative.   Cardiovascular: Negative.   Endocrine: Negative.   Musculoskeletal: Negative.   Neurological: Negative.   Hematological: Negative.   Psychiatric/Behavioral: Negative.   All other systems reviewed and are negative.    Objective: Vital  Signs: There were no vitals taken for this visit.  Physical Exam Vitals and nursing note reviewed.  Constitutional:      Appearance: She is well-developed.  HENT:     Head: Normocephalic and atraumatic.  Pulmonary:     Effort: Pulmonary effort is normal.  Abdominal:     Palpations: Abdomen is soft.  Musculoskeletal:     Cervical back: Neck supple.  Skin:    General: Skin is warm.     Capillary Refill: Capillary refill takes less than 2 seconds.  Neurological:     Mental Status: She is alert and oriented to person, place, and time.  Psychiatric:        Behavior: Behavior normal.        Thought Content: Thought content normal.        Judgment: Judgment normal.     Ortho Exam Left hip exam shows painful logroll and severe pain with any internal or external rotation.  Lateral hip is slightly tender to palpation positive Stinchfield sign.  No sciatic tension signs. Specialty Comments:  No specialty comments available.  Imaging: US Guided Needle Placement - No Linked Charges  Result Date: 01/23/2020 Ultrasound-guided left hip injection: After sterile prep with Betadine, injected 8 cc 1% lidocaine without epinephrine and 40 mg  methylprednisolone using a 22-gauge spinal needle, passing the needle through the iliofemoral ligament into the femoral head/neck junction.  She did not have a significant effusion to warrant aspiration.  Injectate was seen filling the joint capsule.  She did not have much improvement during the immediate anesthetic phase.  She will follow-up as directed.   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    PMFS History: Patient Active Problem List   Diagnosis Date Noted  . Diabetes mellitus with microalbuminuria (HCC) 12/03/2018  . Microalbuminuria 01/22/2018  . Anemia 09/22/2017  . S/P knee replacement 07/28/2017  . Allergic rhinitis 05/26/2017  . Vitamin D deficiency  05/19/2017  . Chronic nonintractable headache 05/18/2017  . Special screening for malignant neoplasms, colon   . Gastroesophageal reflux disease   . Gastritis without bleeding   . 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 Insomnia 11/29/2016  . 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 Vasomotor flushing 04/19/2016  . Abdominal wall pain in right flank 03/29/2016  . Diverticulosis 02/24/2016  . Hypercholesterolemia 06/16/2015  . FHx: migraine headaches 04/13/2015  . Chronic pain 04/13/2015  . Neck pain 02/26/2015  . Status post lumbar surgery 02/26/2015  . Abnormal ECG 01/27/2015  . Angina pectoris (HCC) 01/27/2015  . 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) 01/27/2015   Past Medical History:  Diagnosis Date  . Arthritis   . Asthma   . 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 Cataract    right eye but immature  . Essential hypertension, benign    takes Lisinopril-HCTZ daily  . GERD (gastroesophageal reflux disease)   . Headache    sinus  . History of colon polyps    benign  . History of migraine   . History of shingles   . Hyperlipidemia    takes Lipitor daily  . Low back pain   . Nocturia   . S/P insertion of spinal cord stimulator   . Seasonal allergies    takes Singulair daily as needed  . Type II or unspecified type diabetes mellitus without mention of complication, not stated as uncontrolled   . Vitamin D deficiency    takes Vit D weekly  . Weakness    numbness and tingling left arm  . Wears dentures    full upper and lower    Family History  Problem Relation Age of Onset  . Heart attack Mother   . Sarcoidosis Sister     Past Surgical History:  Procedure Laterality Date  . ABDOMINAL HYSTERECTOMY    . ANTERIOR CERVICAL DECOMP/DISCECTOMY FUSION N/A 02/26/2015   Procedure: ANTERIOR CERVICAL DISCECTOMY FUSION C4-5 (1 LEVEL);  Surgeon: Venita Lick, MD;  Location: [facility] OR;  Service:  Orthopedics;  Laterality: N/A;  . ANTERIOR LAT LUMBAR FUSION N/A 03/21/2013   Procedure: ANTERIOR LATERAL LUMBAR FUSION 1 LEVEL/ XLIF L3-L4 ;  Surgeon: Venita Lick, MD;  Location: [facility] OR;  Service: Orthopedics;  Laterality: N/A;  . APPENDECTOMY    . AUGMENTATION MAMMAPLASTY Bilateral 1978  . BACK SURGERY    . BACK SURGERY     Lumbar fusion x 2  . BREAST EXCISIONAL BIOPSY Right 1970  . BREAST SURGERY  1990   Augementation  . CARDIAC CATHETERIZATION  5/12   ef 55%  . CARDIAC CATHETERIZATION  10/2010   [facility]; EF 55%  . CARDIAC CATHETERIZATION Left 02/16/2016   Procedure: Left Heart Cath  and Coronary Angiography;  Surgeon: Alwyn Pea, MD;  Location: [facility] INVASIVE CV LAB;  Service: Cardiovascular;  Laterality: Left;  . COLONOSCOPY    . COLONOSCOPY WITH PROPOFOL N/A 04/10/2017   Procedure: COLONOSCOPY WITH PROPOFOL;  Surgeon: Midge Minium, MD;  Location: [facility];  Service: Gastroenterology;  Laterality: N/A;  Diabetic - insulin  . ESOPHAGOGASTRODUODENOSCOPY    . KIDNEY SURGERY  1998   growth removed from left kidney   . LEFT HEART CATH AND CORONARY ANGIOGRAPHY Left 09/10/2018   Procedure: LEFT HEART CATH AND CORONARY ANGIOGRAPHY;  Surgeon: Alwyn Pea, MD;  Location: [facility] INVASIVE CV LAB;  Service: Cardiovascular;  Laterality: Left;  . pain stimulator    . POSTERIOR CERVICAL FUSION/FORAMINOTOMY Right 03/21/2013   Procedure: POSTERIOR L2-3 RIGHT FORAMINOTOMY;  Surgeon: Venita Lick, MD;  Location: [facility] OR;  Service: Orthopedics;  Laterality: Right;  . REVISION OF SCAR TISSUE RECTUS MUSCLE    . SMALL BOWEL REPAIR    . SPINAL CORD STIMULATOR BATTERY EXCHANGE N/A 10/17/2012   Procedure: SPINAL CORD STIMULATOR BATTERY REMOVAL;  Surgeon: Venita Lick, MD;  Location: [facility] OR;  Service: Orthopedics;  Laterality: N/A;  . SPINAL CORD STIMULATOR BATTERY EXCHANGE N/A 07/22/2015   Procedure: REIMPLANTATION OF SPINAL CORD STIMULATOR BATTERY ;  Surgeon: Venita Lick, MD;  Location: [facility]  OR;  Service: Orthopedics;  Laterality: N/A;  . TOTAL KNEE ARTHROPLASTY Left   . TOTAL KNEE ARTHROPLASTY Right 07/28/2017   Procedure: RIGHT TOTAL KNEE ARTHROPLASTY;  Surgeon: Eugenia Mcalpine, MD;  Location: [facility] ORS;  Service: Orthopedics;  Laterality: Right;   Social History   Occupational History  . Not on file  Tobacco Use  . Smoking status: Former Smoker    Types: Cigarettes    Quit date: 2006    Years since quitting: 15.4  . Smokeless tobacco: Never Used  . Tobacco comment: quit smoking 10yrs ago  Substance and Sexual Activity  . Alcohol use: No  . Drug use: No  . Sexual activity: Yes    Partners: Male    Birth control/protection: Surgical

## 2020-01-23 NOTE — Progress Notes (Signed)
Subjective: Patient is here for ultrasound-guided intra-articular left hip injection.   Pain in the groin with any movement of her hip.  Objective: Quite a bit of pain with internal rotation.  Procedure: Ultrasound-guided left hip injection: After sterile prep with Betadine, injected 8 cc 1% lidocaine without epinephrine and 40 mg methylprednisolone using a 22-gauge spinal needle, passing the needle through the iliofemoral ligament into the femoral head/neck junction.  She did not have a significant effusion to warrant aspiration.  Injectate was seen filling the joint capsule.  She did not have much improvement during the immediate anesthetic phase.  She will follow-up as directed.

## 2020-01-28 DIAGNOSIS — M25552 Pain in left hip: Secondary | ICD-10-CM | POA: Diagnosis not present

## 2020-01-28 DIAGNOSIS — R262 Difficulty in walking, not elsewhere classified: Secondary | ICD-10-CM | POA: Diagnosis not present

## 2020-01-28 DIAGNOSIS — M545 Low back pain: Secondary | ICD-10-CM | POA: Diagnosis not present

## 2020-01-30 ENCOUNTER — Other Ambulatory Visit: Payer: Self-pay

## 2020-01-30 ENCOUNTER — Ambulatory Visit: Payer: Self-pay | Admitting: Pharmacist

## 2020-01-30 DIAGNOSIS — R809 Proteinuria, unspecified: Secondary | ICD-10-CM

## 2020-01-30 DIAGNOSIS — Z794 Long term (current) use of insulin: Secondary | ICD-10-CM

## 2020-01-30 DIAGNOSIS — J309 Allergic rhinitis, unspecified: Secondary | ICD-10-CM

## 2020-01-30 NOTE — Patient Instructions (Addendum)
Visit Information  Goals Addressed   None     Print copy of patient instructions provided.   Telephone follow up appointment with pharmacy team member scheduled for: 1 month  Milus Height, PharmD, [place], CTTS Clinical Pharmacist [facility] [phone] Allergic Rhinitis, Adult Allergic rhinitis is a reaction to allergens in the air. Allergens are tiny specks (particles) in the air that cause your body to have an allergic reaction. This condition cannot be passed from person to person (is not contagious). Allergic rhinitis cannot be cured, but it can be controlled. There are two types of allergic rhinitis:  Seasonal. This type is also called hay fever. It happens only during certain times of the year.  Perennial. This type can happen at any time of the year. What are the causes? This condition may be caused by:  Pollen from grasses, trees, and weeds.  House dust mites.  Pet dander.  Mold. What are the signs or symptoms? Symptoms of this condition include:  Sneezing.  Runny or stuffy nose (nasal congestion).  A lot of mucus in the back of the throat (postnasal drip).  Itchy nose.  Tearing of the eyes.  Trouble sleeping.  Being sleepy during day. How is this treated? There is no cure for this condition. You should avoid things that trigger your symptoms (allergens). Treatment can help to relieve symptoms. This may include:  Medicines that block allergy symptoms, such as antihistamines. These may be given as a shot, nasal spray, or pill.  Shots that are given until your body becomes less sensitive to the allergen (desensitization).  Stronger medicines, if all other treatments have not worked. Follow these instructions at home: Avoiding allergens   Find out what you are allergic to. Common allergens include smoke, dust, and pollen.  Avoid them if you can. These are some of the things that you can do to avoid allergens: ? Replace carpet with  wood, tile, or vinyl flooring. Carpet can trap dander and dust. ? Clean any mold found in the home. ? Do not smoke. Do not allow smoking in your home. ? Change your heating and air conditioning filter at least once a month. ? During allergy season:  Keep windows closed as much as you can. If possible, use air conditioning when there is a lot of pollen in the air.  Use a special filter for allergies with your furnace and air conditioner.  Plan outdoor activities when pollen counts are lowest. This is usually during the early morning or evening hours.  If you do go outdoors when pollen count is high, wear a special mask for people with allergies.  When you come indoors, take a shower and change your clothes before sitting on furniture or bedding. General instructions  Do not use fans in your home.  Do not hang clothes outside to dry.  Wear sunglasses to keep pollen out of your eyes.  Wash your hands right away after you touch household pets.  Take over-the-counter and prescription medicines only as told by your doctor.  Keep all follow-up visits as told by your doctor. This is important. Contact a doctor if:  You have a fever.  You have a cough that does not go away (is persistent).  You start to make whistling sounds when you breathe (wheeze).  Your symptoms do not get better with treatment.  You have thick fluid coming from your nose.  You start to have nosebleeds. Get help right away if:  Your tongue or your lips  are swollen.  You have trouble breathing.  You feel dizzy or you feel like you are going to pass out (faint).  You have cold sweats. Summary  Allergic rhinitis is a reaction to allergens in the air.  This condition may be caused by allergens. These include pollen, dust mites, pet dander, and mold.  Symptoms include a runny, itchy nose, sneezing, or tearing eyes. You may also have trouble sleeping or feel sleepy during the day.  Treatment includes  taking medicines and avoiding allergens. You may also get shots or take stronger medicines.  Get help if you have a fever or a cough that does not stop. Get help right away if you are short of breath. This information is not intended to replace advice given to you by your health care provider. Make sure you discuss any questions you have with your health care provider. Document Revised: 11/27/2018 Document Reviewed: 02/27/2018 Elsevier Patient Education  2020 [employer].

## 2020-01-30 NOTE — Chronic Care Management (AMB) (Signed)
Chronic Care Management Pharmacy  Name: BARBARAJEAN KINZLER  MRN: [id number] DOB: [birth year]  Chief Complaint/ HPI  Lori Potts,  74 y.o. , female presents for their Follow-Up CCM visit with the clinical pharmacist via telephone due to COVID-19 Pandemic.  PCP : Delsa Grana, PA-C  Their chronic conditions include: DM, COPD  Office Visits:NA  Consult Visit: 6/3 DJD hip, Xu, limping, steroid injection  Medications: Outpatient Encounter Medications as of 01/30/2020  Medication Sig Note  . albuterol (VENTOLIN HFA) 108 (90 Base) MCG/ACT inhaler Inhale 1 puff into the lungs every 6 (six) hours as needed for wheezing or shortness of breath.   . Alcohol Swabs (B-D SINGLE USE SWABS REGULAR) PADS 1 application by Does not apply route 2 (two) times daily.   Marland Kitchen aspirin EC 81 MG tablet Take 81 mg by mouth daily.   . Blood Glucose Monitoring Suppl (TRUE METRIX METER) w/Device KIT Use to check fingerstick blood sugars once a day; LON 99 months; E11.9   . budesonide-formoterol (SYMBICORT) 160-4.5 MCG/ACT inhaler Inhale 2 puffs into the lungs 2 (two) times daily. (Patient not taking: Reported on 01/02/2020)   . citalopram (CELEXA) 10 MG tablet Take 1 tablet (10 mg total) by mouth daily. For nerves, stress, anxiety symptoms   . Dulaglutide (TRULICITY) 8.84 ZY/6.0YT SOPN Inject 0.75 mg into the skin once a week. (Patient not taking: Reported on 01/02/2020)   . Insulin Pen Needle (BD ULTRA-FINE PEN NEEDLES) 29G X 12.7MM MISC For use with injectable GLP-1 inhibitor, Victoza, once a day   . ipratropium-albuterol (DUONEB) 0.5-2.5 (3) MG/3ML SOLN CAN TAKE 3ML EVERY 20 MINUTES FOR 2 DOSES AS NEEDED FOR WHEEZING   . Lancets (ACCU-CHEK MULTICLIX) lancets Use as instructed   . levocetirizine (XYZAL) 5 MG tablet Take 1 tablet (5 mg total) by mouth every evening.   Marland Kitchen losartan (COZAAR) 25 MG tablet TAKE 1 TABLET EVERY DAY   . oxyCODONE-acetaminophen (PERCOCET) 10-325 MG tablet Take 1 tablet by mouth 5 (five) times  daily.  06/12/2019: prn  . propranolol (INDERAL) 10 MG tablet Take 1 tablet (10 mg total) by mouth 3 (three) times daily. (Patient not taking: Reported on 01/02/2020)   . rosuvastatin (CRESTOR) 40 MG tablet Take 1 tablet (40 mg total) by mouth daily.   . sodium chloride (OCEAN) 0.65 % SOLN nasal spray Place 1 spray into both nostrils 2 (two) times daily as needed for congestion.   . SUMAtriptan (IMITREX) 50 MG tablet Take 1-2 tablets (50-100 mg total) by mouth every 2 (two) hours as needed for migraine (max dose 200 in 24 h). May repeat in 2 hours if headache persists or recurs. (Patient not taking: Reported on 01/02/2020)   . TRUE METRIX BLOOD GLUCOSE TEST test strip TEST BLOOD SUGAR ONE TIME DAILY    No facility-administered encounter medications on file as of 01/30/2020.      Financial Resource Strain: High Risk  . Difficulty of Paying Living Expenses: Very hard   Current Diagnosis/Assessment:  Goals Addressed   None    Diabetes   Recent Relevant Labs: Lab Results  Component Value Date/Time   HGBA1C 7.2 (H) 11/14/2019 12:06 PM   HGBA1C 6.6 03/31/2019 12:00 AM   HGBA1C 6.3 (H) 05/09/2018 09:50 AM   MICROALBUR 5.2 05/14/2019 12:00 AM   MICROALBUR 3.8 05/09/2018 09:50 AM   MICROALBUR 20 07/04/2016 10:36 AM    Last diabetic Foot exam:  Lab Results  Component Value Date/Time   HMDIABEYEEXA No Retinopathy 12/27/2016 12:00 AM  Last diabetic Eye exam: No results found for: HMDIABFOOTEX   We discussed:  Progress with lifestyle modifications  Plan  Continue current medications   COPD / Asthma / Tobacco   Eosinophil count:   Lab Results  Component Value Date/Time   EOSPCT 2.6 11/14/2019 12:06 PM   EOSPCT 2.3 11/26/2013 08:48 AM  %                               Eos (Absolute):  Lab Results  Component Value Date/Time   EOSABS 130 11/14/2019 12:06 PM   EOSABS 0.1 11/26/2013 08:48 AM    Tobacco Status:  Social History   Tobacco Use  Smoking Status Former Smoker  .  Types: Cigarettes  . Quit date: 2006  . Years since quitting: 15.4  Smokeless Tobacco Never Used  Tobacco Comment   quit smoking 7yr ago    Patient has failed these meds in past: NA Patient is currently uncontrolled on the following medications: Duoneb Using maintenance inhaler regularly? Yes Frequency of rescue inhaler use:  NA  We discussed:   Persistent cough Needs Afrin and/or robitussin Counseled on short term Afrin use  Plan  Continue current medications  Medication Management   Pt uses [facility] for all medications Uses pill box? Yes Pt endorses 100% compliance  We discussed:   [facility] very [facility] Still working on getting financial paperwork   Plan  Continue current medication management strategy   Follow up: 1 month phone visit, primarily patient assistance  TMilus Height PharmD, [phone]

## 2020-02-06 DIAGNOSIS — M25552 Pain in left hip: Secondary | ICD-10-CM | POA: Diagnosis not present

## 2020-02-06 DIAGNOSIS — M5416 Radiculopathy, lumbar region: Secondary | ICD-10-CM | POA: Diagnosis not present

## 2020-02-06 DIAGNOSIS — Z6831 Body mass index (BMI) 31.0-31.9, adult: Secondary | ICD-10-CM | POA: Insufficient documentation

## 2020-02-13 ENCOUNTER — Other Ambulatory Visit: Payer: Self-pay | Admitting: Family Medicine

## 2020-02-18 ENCOUNTER — Encounter: Payer: Self-pay | Admitting: Family Medicine

## 2020-02-18 ENCOUNTER — Ambulatory Visit (INDEPENDENT_AMBULATORY_CARE_PROVIDER_SITE_OTHER): Payer: Medicare HMO | Admitting: Family Medicine

## 2020-02-18 ENCOUNTER — Other Ambulatory Visit: Payer: Self-pay

## 2020-02-18 VITALS — BP 118/72 | HR 62 | Temp 97.8°F | Resp 16 | Ht 65.0 in | Wt 184.0 lb

## 2020-02-18 DIAGNOSIS — R5383 Other fatigue: Secondary | ICD-10-CM | POA: Diagnosis not present

## 2020-02-18 DIAGNOSIS — E0829 Diabetes mellitus due to underlying condition with other diabetic kidney complication: Secondary | ICD-10-CM | POA: Diagnosis not present

## 2020-02-18 DIAGNOSIS — G8929 Other chronic pain: Secondary | ICD-10-CM

## 2020-02-18 DIAGNOSIS — J309 Allergic rhinitis, unspecified: Secondary | ICD-10-CM

## 2020-02-18 DIAGNOSIS — Z794 Long term (current) use of insulin: Secondary | ICD-10-CM | POA: Diagnosis not present

## 2020-02-18 DIAGNOSIS — I10 Essential (primary) hypertension: Secondary | ICD-10-CM | POA: Diagnosis not present

## 2020-02-18 DIAGNOSIS — K219 Gastro-esophageal reflux disease without esophagitis: Secondary | ICD-10-CM

## 2020-02-18 DIAGNOSIS — R61 Generalized hyperhidrosis: Secondary | ICD-10-CM | POA: Diagnosis not present

## 2020-02-18 DIAGNOSIS — R519 Headache, unspecified: Secondary | ICD-10-CM

## 2020-02-18 DIAGNOSIS — R809 Proteinuria, unspecified: Secondary | ICD-10-CM

## 2020-02-18 DIAGNOSIS — R002 Palpitations: Secondary | ICD-10-CM

## 2020-02-18 DIAGNOSIS — E7800 Pure hypercholesterolemia, unspecified: Secondary | ICD-10-CM

## 2020-02-18 DIAGNOSIS — F419 Anxiety disorder, unspecified: Secondary | ICD-10-CM

## 2020-02-18 DIAGNOSIS — Z5181 Encounter for therapeutic drug level monitoring: Secondary | ICD-10-CM

## 2020-02-18 DIAGNOSIS — J45909 Unspecified asthma, uncomplicated: Secondary | ICD-10-CM | POA: Diagnosis not present

## 2020-02-18 DIAGNOSIS — F430 Acute stress reaction: Secondary | ICD-10-CM

## 2020-02-18 LAB — COMPLETE METABOLIC PANEL WITH GFR
AG Ratio: 1.6 (calc) (ref 1.0–2.5)
ALT: 16 U/L (ref 6–29)
AST: 17 U/L (ref 10–35)
Albumin: 4.1 g/dL (ref 3.6–5.1)
Alkaline phosphatase (APISO): 77 U/L (ref 37–153)
BUN: 15 mg/dL (ref 7–25)
CO2: 31 mmol/L (ref 20–32)
Calcium: 9.7 mg/dL (ref 8.6–10.4)
Chloride: 106 mmol/L (ref 98–110)
Creat: 0.66 mg/dL (ref 0.60–0.93)
GFR, Est African American: 101 mL/min/{1.73_m2} (ref >=60)
GFR, Est Non African American: 87 mL/min/{1.73_m2} (ref >=60)
Globulin: 2.5 g/dL (calc) (ref 1.9–3.7)
Glucose, Bld: 115 mg/dL — ABNORMAL HIGH (ref 65–99)
Potassium: 4.6 mmol/L (ref 3.5–5.3)
Sodium: 142 mmol/L (ref 135–146)
Total Bilirubin: 0.5 mg/dL (ref 0.2–1.2)
Total Protein: 6.6 g/dL (ref 6.1–8.1)

## 2020-02-18 LAB — CBC WITH DIFFERENTIAL/PLATELET
Absolute Monocytes: 328 cells/uL (ref 200–950)
Basophils Absolute: 50 cells/uL (ref 0–200)
Basophils Relative: 1.2 %
Eosinophils Absolute: 176 cells/uL (ref 15–500)
Eosinophils Relative: 4.2 %
HCT: 38.1 % (ref 35.0–45.0)
Hemoglobin: 12.3 g/dL (ref 11.7–15.5)
Lymphs Abs: 1058 cells/uL (ref 850–3900)
MCH: 25.9 pg — ABNORMAL LOW (ref 27.0–33.0)
MCHC: 32.3 g/dL (ref 32.0–36.0)
MCV: 80.2 fL (ref 80.0–100.0)
MPV: 10.6 fL (ref 7.5–12.5)
Monocytes Relative: 7.8 %
Neutro Abs: 2587 cells/uL (ref 1500–7800)
Neutrophils Relative %: 61.6 %
Platelets: 361 10*3/uL (ref 140–400)
RBC: 4.75 10*6/uL (ref 3.80–5.10)
RDW: 14.2 % (ref 11.0–15.0)
Total Lymphocyte: 25.2 %
WBC: 4.2 10*3/uL (ref 3.8–10.8)

## 2020-02-18 LAB — T4, FREE: Free T4: 1.1 ng/dL (ref 0.8–1.8)

## 2020-02-18 LAB — TSH: TSH: 2 mIU/L (ref 0.40–4.50)

## 2020-02-18 MED ORDER — PROMETHAZINE HCL 25 MG PO TABS: 25.0000 mg | ORAL_TABLET | Freq: Three times a day (TID) | ORAL | 2 refills | Status: DC | PRN

## 2020-02-18 MED ORDER — CITALOPRAM HYDROBROMIDE 20 MG PO TABS: 20.0000 mg | ORAL_TABLET | Freq: Every day | ORAL | 3 refills | Status: DC

## 2020-02-18 MED ORDER — SUMATRIPTAN SUCCINATE 50 MG PO TABS: 50.0000 mg | ORAL_TABLET | ORAL | 2 refills | Status: DC | PRN

## 2020-02-18 MED ORDER — ESOMEPRAZOLE MAGNESIUM 40 MG PO CPDR: 40.0000 mg | DELAYED_RELEASE_CAPSULE | Freq: Every day | ORAL | 1 refills | Status: DC

## 2020-02-18 NOTE — Patient Instructions (Signed)
3 month follow up over the phone - to follow up on headaches and BP/palpitations medicines  Consider changing propanolol from the three times a day to a similar medicine - metoprolol extended release once a day with a low dose.  You should hear from the neurologist

## 2020-02-18 NOTE — Progress Notes (Signed)
Name: Lori Potts   MRN: [id number]    DOB: 1946/03/30   Date:02/18/2020       Progress Note  Chief Complaint  Patient presents with  . Follow-up  . Anxiety  . Diabetes  . Hypertension  . Migraine     Subjective:   Lori Potts is a 74 y.o. female, presents to clinic for   Hypertension:  Currently managed on losartan 25 mg Pt reports good med compliance and denies any SE.  No lightheadedness, hypotension, syncope. Blood pressure today is well controlled. BP Readings from Last 3 Encounters:  02/18/20 118/72  11/14/19 136/64  11/05/19 138/64  Pt denies CP, SOB, exertional sx, LE edema, palpitation, Ha's, visual disturbances On propranolol, she previously was taking extended release at a higher dose which is causing dyspnea on exertion and fatigue we decrease it to 10 mg 3 times daily, she had similar symptoms and side effects and at last appointment discussed discontinuing that but she has continued to take propranolol she does note that her headaches have been less frequent.  We discussed changing medicines today but she would like to continue with propranolol 10 mg 3 times daily in addition to losartan 25 mg daily   Hyperlipidemia: Currently treated with crestor, pt reports good med compliance Labs from September 2020 were slightly uncontrolled she did improve her compliance and labs from 3 months ago showed improved lipid panel Last Lipids: Lab Results  Component Value Date   CHOL 164 11/14/2019   HDL 52 11/14/2019   LDLCALC 93 11/14/2019   TRIG 93 11/14/2019   CHOLHDL 3.2 11/14/2019   - Denies: Chest pain, shortness of breath, myalgias, claudication The 10-year ASCVD risk score Mikey Bussing DC Jr., et al., 2013) is: 23.7%   Values used to calculate the score:     Age: 7 years     Sex: Female     Is Non-Hispanic African American: Yes     Diabetic: Yes     Tobacco smoker: No     Systolic Blood Pressure: 505 mmHg     Is BP treated: Yes     HDL Cholesterol: 52  mg/dL     Total Cholesterol: 164 mg/dL    Reactive airway?  Asthma?   Allergic rhinitis with bronchospasms or reactive airway - getting relief with albuterol inhaler and duonebs Allergies are not bothering her as bad as they were previously and she does not feel like her headaches are triggered as often by her sinuses and nasal allergies.  She is using inhalers about 2 times a week, she feels as well controlled.   Palpitations/sweats - not related to menopausal sx but she suspects is related to stress and anxiety- long hx of - she has had thyroid checked many time, recent consult with [facility] pharmacist suggested recheck with T4 - he thought sx suspicious for hyperthyroid Discussed hyperthyroid and hypothyroid, I had previously reviewed her chart and her thyroid has been checked several times without any abnormalities with TSH although it is low end of normal.  Patient agrees to do TSH and T4 today  Stress and anxiety: On celexa 10 mg GAD 7 : Generalized Anxiety Score 02/18/2020 11/14/2019 12/25/2018 11/19/2018  Nervous, Anxious, on Edge 0 2 0 0  Control/stop worrying 0 3 0 0  Worry too much - different things 0 1 0 0  Trouble relaxing 0 2 0 0  Restless 0 1 0 0  Easily annoyed or irritable 0 1 0 0  Afraid -  awful might happen 0 2 0 0  Total GAD 7 Score 0 12 0 0  Anxiety Difficulty Not difficult at all Somewhat difficult Not difficult at all Not difficult at all  Her anxiety symptoms have improved with taking Celexa, no reported side effects or concerns today, pharmacist had suggested we increase it to 20 mg will change her prescription today  DM- on trulicity -cost is an issue she is still needing to go to [place] to complete some documents for her income to be able to get financial assistance for Trulicity she has been coming here to get samples and refills she does her shots every Tuesday she is on 0.75 mg dose does not have any side effects, she is checking her blood sugar 3-4 times a day  typically ranges in the low 100s Lab Results  Component Value Date   HGBA1C 7.2 (H) 11/14/2019    Migraines - migraine frequency has improved a little - she had a migraine start yesterday,.  It is unilateral with associated photophobia and nausea.  She has run out of her Imitrex she had monthly refills of the past 3 months but states that she "must of use it all" we will send through refills today.  She notes that she previously saw specialist but they were too expensive she would like to see if she can see a different headache specialist under her [employer].  She reports previously being on injections with her prior PCP reports that [facility] works for her they usually give her shot in the office or she gets pills and takes at home.  She is using Tylenol about 4 days a week for her headache symptoms She does feel like right now her headache is worse because of recent family stress with the loss of a family member and she is helping every day to take care of 2 elderly aunts were in their 65s were dealing with the loss of their child and her having dementia and sundowning she is feeling very tired from helping with all of this since death occurred around Father's Day      Current Outpatient Medications:  .  albuterol (VENTOLIN HFA) 108 (90 Base) MCG/ACT inhaler, Inhale 1 puff into the lungs every 6 (six) hours as needed for wheezing or shortness of breath., Disp: 18 g, Rfl: 2 .  Alcohol Swabs (B-D SINGLE USE SWABS REGULAR) PADS, 1 application by Does not apply route 2 (two) times daily., Disp: 90 each, Rfl: 0 .  aspirin EC 81 MG tablet, Take 81 mg by mouth daily., Disp: , Rfl:  .  Blood Glucose Monitoring Suppl (TRUE METRIX METER) w/Device KIT, Use to check fingerstick blood sugars once a day; LON 99 months; E11.9, Disp: 1 kit, Rfl: 0 .  citalopram (CELEXA) 10 MG tablet, Take 1 tablet (10 mg total) by mouth daily. For nerves, stress, anxiety symptoms, Disp: 90 tablet, Rfl: 1 .  Dulaglutide  (TRULICITY) 1.95 KD/3.2IZ SOPN, Inject 0.75 mg into the skin once a week., Disp: 8 pen, Rfl: 1 .  ipratropium-albuterol (DUONEB) 0.5-2.5 (3) MG/3ML SOLN, CAN TAKE 3ML EVERY 20 MINUTES FOR 2 DOSES AS NEEDED FOR WHEEZING, Disp: 90 mL, Rfl: 0 .  levocetirizine (XYZAL) 5 MG tablet, Take 1 tablet (5 mg total) by mouth every evening., Disp: 30 tablet, Rfl: 2 .  losartan (COZAAR) 25 MG tablet, TAKE 1 TABLET EVERY DAY, Disp: 90 tablet, Rfl: 3 .  oxyCODONE-acetaminophen (PERCOCET) 10-325 MG tablet, Take 1 tablet by mouth 5 (five) times  daily. , Disp: , Rfl: 0 .  propranolol (INDERAL) 10 MG tablet, TAKE 1 TABLET BY MOUTH THREE TIMES A DAY, Disp: 270 tablet, Rfl: 3 .  rosuvastatin (CRESTOR) 40 MG tablet, Take 1 tablet (40 mg total) by mouth daily., Disp: 90 tablet, Rfl: 3 .  SUMAtriptan (IMITREX) 50 MG tablet, Take 1-2 tablets (50-100 mg total) by mouth every 2 (two) hours as needed for migraine (max dose 200 in 24 h). May repeat in 2 hours if headache persists or recurs., Disp: 10 tablet, Rfl: 2 .  budesonide-formoterol (SYMBICORT) 160-4.5 MCG/ACT inhaler, Inhale 2 puffs into the lungs 2 (two) times daily. (Patient not taking: Reported on 01/02/2020), Disp: 1 Inhaler, Rfl: 3 .  Insulin Pen Needle (BD ULTRA-FINE PEN NEEDLES) 29G X 12.7MM MISC, For use with injectable GLP-1 inhibitor, Victoza, once a day (Patient not taking: Reported on 02/18/2020), Disp: 100 each, Rfl: 3 .  Lancets (ACCU-CHEK MULTICLIX) lancets, Use as instructed (Patient not taking: Reported on 02/18/2020), Disp: 100 each, Rfl: 12 .  sodium chloride (OCEAN) 0.65 % SOLN nasal spray, Place 1 spray into both nostrils 2 (two) times daily as needed for congestion. (Patient not taking: Reported on 02/18/2020), Disp: 60 mL, Rfl: 5 .  TRUE METRIX BLOOD GLUCOSE TEST test strip, TEST BLOOD SUGAR ONE TIME DAILY (Patient not taking: Reported on 02/18/2020), Disp: 100 strip, Rfl: 2  Patient Active Problem List   Diagnosis Date Noted  . Diabetes mellitus with  microalbuminuria ([place]) 12/03/2018  . Microalbuminuria 01/22/2018  . Anemia 09/22/2017  . S/P knee replacement 07/28/2017  . Allergic rhinitis 05/26/2017  . Vitamin D deficiency 05/19/2017  . Chronic nonintractable headache 05/18/2017  . Special screening for malignant neoplasms, colon   . Gastroesophageal reflux disease   . Gastritis without bleeding   . 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 Insomnia 11/29/2016  . 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 Vasomotor flushing 04/19/2016  . Abdominal wall pain in right flank 03/29/2016  . Diverticulosis 02/24/2016  . Hypercholesterolemia 06/16/2015  . FHx: migraine headaches 04/13/2015  . Chronic pain 04/13/2015  . Neck pain 02/26/2015  . Status post lumbar surgery 02/26/2015  . Abnormal ECG 01/27/2015  . Angina pectoris ([place]) 01/27/2015  . 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[place]) 01/27/2015    Past Surgical History:  Procedure Laterality Date  . ABDOMINAL HYSTERECTOMY    . ANTERIOR CERVICAL DECOMP/DISCECTOMY FUSION N/A 02/26/2015   Procedure: ANTERIOR CERVICAL DISCECTOMY FUSION C4-5 (1 LEVEL);  Surgeon: Melina Schools, MD;  Location: [facility];  Service: Orthopedics;  Laterality: N/A;  . ANTERIOR LAT LUMBAR FUSION N/A 03/21/2013   Procedure: ANTERIOR LATERAL LUMBAR FUSION 1 LEVEL/ XLIF L3-L4 ;  Surgeon: Melina Schools, MD;  Location: [facility];  Service: Orthopedics;  Laterality: N/A;  . APPENDECTOMY    . AUGMENTATION MAMMAPLASTY Bilateral 1978  . BACK SURGERY    . BACK SURGERY     Lumbar fusion x 2  . BREAST EXCISIONAL BIOPSY Right 1970  . BREAST SURGERY  1990   Augementation  . CARDIAC CATHETERIZATION  5/12   ef 55%  . CARDIAC CATHETERIZATION  10/2010   [facility]; EF 55%  . CARDIAC CATHETERIZATION Left 02/16/2016   Procedure: Left Heart Cath and Coronary Angiography;  Surgeon: Yolonda Kida, MD;  Location: [facility];  Service: Cardiovascular;  Laterality:  Left;  . COLONOSCOPY    . COLONOSCOPY WITH PROPOFOL N/A 04/10/2017   Procedure: COLONOSCOPY WITH PROPOFOL;  Surgeon:  Lucilla Lame, MD;  Location: [facility];  Service: Gastroenterology;  Laterality: N/A;  Diabetic - insulin  . ESOPHAGOGASTRODUODENOSCOPY    . [place]   growth removed from left kidney   . LEFT HEART CATH AND CORONARY ANGIOGRAPHY Left 09/10/2018   Procedure: LEFT HEART CATH AND CORONARY ANGIOGRAPHY;  Surgeon: Yolonda Kida, MD;  Location: [facility];  Service: Cardiovascular;  Laterality: Left;  . pain stimulator    . POSTERIOR CERVICAL FUSION/FORAMINOTOMY Right 03/21/2013   Procedure: POSTERIOR L2-3 RIGHT FORAMINOTOMY;  Surgeon: Melina Schools, MD;  Location: [facility];  Service: Orthopedics;  Laterality: Right;  . REVISION OF SCAR TISSUE RECTUS MUSCLE    . SMALL BOWEL REPAIR    . SPINAL CORD STIMULATOR BATTERY EXCHANGE N/A 10/17/2012   Procedure: SPINAL CORD STIMULATOR BATTERY REMOVAL;  Surgeon: Melina Schools, MD;  Location: [facility];  Service: Orthopedics;  Laterality: N/A;  . SPINAL CORD STIMULATOR BATTERY EXCHANGE N/A 07/22/2015   Procedure: REIMPLANTATION OF SPINAL CORD STIMULATOR BATTERY ;  Surgeon: Melina Schools, MD;  Location: [facility];  Service: Orthopedics;  Laterality: N/A;  . TOTAL KNEE ARTHROPLASTY Left   . TOTAL KNEE ARTHROPLASTY Right 07/28/2017   Procedure: RIGHT TOTAL KNEE ARTHROPLASTY;  Surgeon: Sydnee Cabal, MD;  Location: [facility];  Service: Orthopedics;  Laterality: Right;    Family History  Problem Relation Age of Onset  . Heart attack Mother   . Sarcoidosis Sister     Social History   Tobacco Use  . Smoking status: Former Smoker    Types: Cigarettes    Quit date: 2006    Years since quitting: 15.5  . Smokeless tobacco: Never Used  . Tobacco comment: quit smoking 51yr ago  Vaping Use  . Vaping Use: Never used  Substance Use Topics  . Alcohol use: No  . Drug use: No     Allergies  Allergen Reactions  . Metformin  And Related Diarrhea  . Adhesive [Tape] Rash and Other (See Comments)    Regular tape is ok, allergy is to paper tape    Health Maintenance  Topic Date Due  . COVID-19 Vaccine (1) Never done  . OPHTHALMOLOGY EXAM  12/27/2017  . MAMMOGRAM  05/04/2018  . INFLUENZA VACCINE  03/22/2020  . FOOT EXAM  05/13/2020  . HEMOGLOBIN A1C  05/16/2020  . COLONOSCOPY  04/11/2027  . TETANUS/TDAP  05/07/2029  . DEXA SCAN  Completed  . Hepatitis C Screening  Completed  . PNA vac Low Risk Adult  Completed    Chart Review Today: I personally reviewed active problem list, medication list, allergies, family history, social history, health maintenance, notes from last encounter, lab results, imaging with the patient/caregiver today.   Review of Systems  10 Systems reviewed and are negative for acute change except as noted in the HPI.  Objective:   Vitals:   02/18/20 0857  BP: 118/72  Pulse: 62  Resp: 16  Temp: 97.8 F (36.6 C)  TempSrc: Temporal  SpO2: 96%  Weight: 184 lb (83.5 kg)  Height: '5\' 5"'$  (1.651 m)    Body mass index is 30.62 kg/m.  Physical Exam Vitals and nursing note reviewed.  Constitutional:      General: She is not in acute distress.    Appearance: Normal appearance. She is well-developed. She is not ill-appearing, toxic-appearing or diaphoretic.     Interventions: Face mask in place.  HENT:     Head: Normocephalic and atraumatic.     Right Ear:  External ear normal.     Left Ear: External ear normal.  Eyes:     General: Lids are normal. No scleral icterus.       Right eye: No discharge.        Left eye: No discharge.     Conjunctiva/sclera: Conjunctivae normal.     Comments: Some photophobia  Neck:     Trachea: Phonation normal. No tracheal deviation.  Cardiovascular:     Rate and Rhythm: Normal rate and regular rhythm.     Pulses: Normal pulses.          Radial pulses are 2+ on the right side and 2+ on the left side.       Posterior tibial pulses are 2+ on the  right side and 2+ on the left side.     Heart sounds: Normal heart sounds. No murmur heard.  No friction rub. No gallop.   Pulmonary:     Effort: Pulmonary effort is normal. No respiratory distress.     Breath sounds: Normal breath sounds. No stridor. No wheezing, rhonchi or rales.  Chest:     Chest wall: No tenderness.  Abdominal:     General: Bowel sounds are normal. There is no distension.     Palpations: Abdomen is soft.     Tenderness: There is no abdominal tenderness.  Musculoskeletal:     Right lower leg: No edema.     Left lower leg: No edema.  Skin:    General: Skin is warm and dry.     Coloration: Skin is not jaundiced or pale.     Findings: No rash.  Neurological:     Mental Status: She is alert. Mental status is at baseline.     Motor: No abnormal muscle tone.     Gait: Gait normal.  Psychiatric:        Mood and Affect: Mood normal.        Speech: Speech normal.        Behavior: Behavior normal.         Assessment & Plan:   1. 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 Essential hypertension Well-controlled on losartan - COMPLETE METABOLIC PANEL WITH GFR  3. Hypercholesterolemia Compliant with Crestor 40 mg daily her lipid panel improved significantly from September 2020 to  March 2021 will not recheck today- can check annually, no SE or concerns - COMPLETE METABOLIC PANEL WITH GFR  4. Anxiety GAD7 screening reviewed PHQ done and reviewed as well,  improving screening, but continue sweats which she states is related to stress, increase Celexa to 20 mg daily and see if this helps with her physical symptoms She may benefit from chronic care management social worker to talk about caregiver stress or find additional resources for her family members that she is helping to take care of - citalopram (CELEXA) 20 MG tablet; Take 1 tablet (20 mg total) by mouth daily. For nerves, stress, anxiety symptoms  Dispense: 90 tablet; Refill:  3  5. Chronic nonintractable headache, unspecified headache type Patient is using all the prescribed Imitrex monthly and Tylenol about 4 out of 7 days a week Uncontrolled-discussed with her medication overuse headache and encouraged her to try and decrease amount of Tylenol she is using She may be a good candidate for an injectable She was previously seeing a specialist who she could no longer afford He does request a referral today She has a migraine currently that began last night she request refill on her [facility] - SUMAtriptan (IMITREX) 50 MG tablet; Take 1-2 tablets (50-100 mg total) by mouth every 2 (two) hours as needed for migraine (max dose 200 in 24 h). May repeat in 2 hours if headache persists or recurs.  Dispense: 10 tablet; Refill: 2 - promethazine (PHENERGAN) 25 MG tablet; Take 1 tablet (25 mg total) by mouth every 8 (eight) hours as needed for nausea or vomiting.  Dispense: 20 tablet; Refill: 2 - Ambulatory referral to Neurology   6. Allergic rhinitis, unspecified seasonality, unspecified trigger Symptoms have improved with Xyzal, saline nasal spray  7. Diaphoresis She has a past diagnosis of vasomotor flushing, after consulting with pharmacist who recommended checking T4 due to persistently low-normal TSH associated with symptoms which are concerning for hyperthyroid Recheck thyroid again today with an addition of T4, I did discuss with the patient prior to ordering - COMPLETE METABOLIC PANEL WITH  GFR - TSH - T4, free  8. Reactive airway disease without complication, unspecified asthma severity, unspecified whether persistent Newer diagnosis and symptoms without any objective testing-we are checking to see if we have availability for spirometry or PFTs, she is using DuoNebs and/or her rescue inhaler few times a week -pharmacist had suggested a maintenance inhaler, currently patient does not feel like she needs that but is agreeable to testing when we are able to perform that or refer her  9. Gastroesophageal reflux disease, unspecified whether esophagitis present Endorses worsening GERD and reflux symptoms she has had before and it was effectively treated with Nexium she requests that be prescribed today to her local pharmacy - esomeprazole (NEXIUM) 40 MG capsule; Take 1 capsule (40 mg total) by mouth daily at 12 noon.  Dispense: 30 capsule; Refill: 1  10. Acute stress reaction History of anxiety, recent loss of a family member, caring for multiple elderly and members with dementia - citalopram (CELEXA) 20 MG tablet; Take 1 tablet (20 mg total) by mouth daily. For nerves, stress, anxiety symptoms  Dispense: 90 tablet; Refill: 3  11. Anxiety Improved GAD-7 but continued symptoms we will try increasing dose - citalopram (CELEXA) 20 MG tablet; Take 1 tablet (20 mg total) by mouth daily. For nerves, stress, anxiety symptoms  Dispense: 90 tablet; Refill: 3  12. Fatigue, unspecified type Rule out thyroid dysfunction rule out anemia likely fatigue is related to stress and anxiety - CBC with Differential/Platelet - TSH - T4, free  13.  Palpitations Prior thorough work-up with cardiology but she continues to have some palpitation symptoms it is improved on propranolol 10 mg 3 times daily, only some mild fatigue - which she is not sure is related to meds, and suspects more related to everything going on in her life.  We discussed switching to metoprolol extended release 25 mg and seeing if it  manages palpitations adequately without causing side effects, it may not be as helpful for migraines but she does not want to switch right now and just got a 71-monthsupply of propranolol.  She agrees to  28-monthfollow-up to recheck this.  Heart rate is low end of normal, no bradycardia, no syncope, lightheadedness, exertional dyspnea  14.  Encounter for medication monitoring - CBC with Differential/Platelet - COMPLETE METABOLIC PANEL WITH GFR - TSH - T4, free   Return in about 3 months (around 05/20/2020) for Routine follow-up virtual ok.   LDelsa Grana PA-C 02/18/20 9:05 AM

## 2020-02-21 ENCOUNTER — Other Ambulatory Visit: Payer: Self-pay

## 2020-02-21 MED ORDER — TRUE METRIX METER W/DEVICE KIT: PACK | 0 refills | Status: DC

## 2020-02-21 MED ORDER — BD SWAB SINGLE USE REGULAR PADS: 1.0000 "application " | MEDICATED_PAD | Freq: Two times a day (BID) | 0 refills | Status: DC

## 2020-02-21 MED ORDER — ACCU-CHEK MULTICLIX LANCETS MISC: 12 refills | Status: DC

## 2020-02-21 MED ORDER — TRUE METRIX BLOOD GLUCOSE TEST VI STRP: ORAL_STRIP | 2 refills | Status: DC

## 2020-02-26 DIAGNOSIS — G894 Chronic pain syndrome: Secondary | ICD-10-CM | POA: Diagnosis not present

## 2020-02-26 DIAGNOSIS — Z5181 Encounter for therapeutic drug level monitoring: Secondary | ICD-10-CM | POA: Diagnosis not present

## 2020-02-26 DIAGNOSIS — Z79899 Other long term (current) drug therapy: Secondary | ICD-10-CM | POA: Diagnosis not present

## 2020-02-26 DIAGNOSIS — M25552 Pain in left hip: Secondary | ICD-10-CM | POA: Diagnosis not present

## 2020-02-26 DIAGNOSIS — M62830 Muscle spasm of back: Secondary | ICD-10-CM | POA: Diagnosis not present

## 2020-02-26 DIAGNOSIS — M25561 Pain in right knee: Secondary | ICD-10-CM | POA: Diagnosis not present

## 2020-02-27 DIAGNOSIS — M25552 Pain in left hip: Secondary | ICD-10-CM | POA: Diagnosis not present

## 2020-03-03 ENCOUNTER — Other Ambulatory Visit: Payer: Self-pay

## 2020-03-03 ENCOUNTER — Ambulatory Visit (INDEPENDENT_AMBULATORY_CARE_PROVIDER_SITE_OTHER): Payer: Medicare HMO | Admitting: Pharmacist

## 2020-03-03 DIAGNOSIS — E1129 Type 2 diabetes mellitus with other diabetic kidney complication: Secondary | ICD-10-CM | POA: Diagnosis not present

## 2020-03-03 DIAGNOSIS — G8929 Other chronic pain: Secondary | ICD-10-CM

## 2020-03-03 DIAGNOSIS — R809 Proteinuria, unspecified: Secondary | ICD-10-CM | POA: Diagnosis not present

## 2020-03-03 NOTE — Chronic Care Management (AMB) (Signed)
Chronic Care Management Pharmacy  Name: Lori Potts  MRN: [id number] DOB: [birth year]  Chief Complaint/ HPI  Lori Potts,  74 y.o. , female presents for their Follow-Up CCM visit with the clinical pharmacist via telephone due to COVID-19 Pandemic.  PCP : Delsa Grana, PA-C  Their chronic conditions include: HA, hypothyroid, depression  Office Visits: 6/29 DM, [place], BP 118/72 P 62 Wt 184 BMI 30.6, HA less frequent on propranolol, inh biwk, check TSH/T4, getting financial documents, inc Celexa '20mg'$  daily, switch to Toprol XL for palpitations?   Consult Visit: NA  Medications: Outpatient Encounter Medications as of 03/03/2020  Medication Sig Note  . fluticasone furoate-vilanterol (BREO ELLIPTA) 100-25 MCG/INH AEPB Inhale 1 puff into the lungs daily.   Marland Kitchen albuterol (VENTOLIN HFA) 108 (90 Base) MCG/ACT inhaler Inhale 1 puff into the lungs every 6 (six) hours as needed for wheezing or shortness of breath.   . Alcohol Swabs (B-D SINGLE USE SWABS REGULAR) PADS 1 application by Does not apply route 2 (two) times daily. DX:E11.9, LON:99   . aspirin EC 81 MG tablet Take 81 mg by mouth daily.   . Blood Glucose Monitoring Suppl (TRUE METRIX METER) w/Device KIT Use to check fingerstick blood sugars once a day; LON 99 months; E11.9   . budesonide-formoterol (SYMBICORT) 160-4.5 MCG/ACT inhaler Inhale 2 puffs into the lungs 2 (two) times daily. (Patient not taking: Reported on 01/02/2020)   . citalopram (CELEXA) 20 MG tablet Take 1 tablet (20 mg total) by mouth daily. For nerves, stress, anxiety symptoms   . Dulaglutide (TRULICITY) 0.99 IP/3.8SN SOPN Inject 0.75 mg into the skin once a week.   . esomeprazole (NEXIUM) 40 MG capsule Take 1 capsule (40 mg total) by mouth daily at 12 noon.   Marland Kitchen glucose blood (TRUE METRIX BLOOD GLUCOSE TEST) test strip Test Qd Dx:E11.9, LON:99   . Insulin Pen Needle (BD ULTRA-FINE PEN NEEDLES) 29G X 12.7MM MISC For use with injectable GLP-1 inhibitor, Victoza, once  a day (Patient not taking: Reported on 02/18/2020)   . ipratropium-albuterol (DUONEB) 0.5-2.5 (3) MG/3ML SOLN CAN TAKE 3ML EVERY 20 MINUTES FOR 2 DOSES AS NEEDED FOR WHEEZING   . Lancets (ACCU-CHEK MULTICLIX) lancets Dx:E11.9, LON:99 check QD   . levocetirizine (XYZAL) 5 MG tablet Take 1 tablet (5 mg total) by mouth every evening.   Marland Kitchen losartan (COZAAR) 25 MG tablet TAKE 1 TABLET EVERY DAY   . oxyCODONE-acetaminophen (PERCOCET) 10-325 MG tablet Take 1 tablet by mouth 5 (five) times daily.  06/12/2019: prn  . promethazine (PHENERGAN) 25 MG tablet Take 1 tablet (25 mg total) by mouth every 8 (eight) hours as needed for nausea or vomiting.   . propranolol (INDERAL) 10 MG tablet TAKE 1 TABLET BY MOUTH THREE TIMES A DAY   . rosuvastatin (CRESTOR) 40 MG tablet Take 1 tablet (40 mg total) by mouth daily.   . sodium chloride (OCEAN) 0.65 % SOLN nasal spray Place 1 spray into both nostrils 2 (two) times daily as needed for congestion. (Patient not taking: Reported on 02/18/2020)   . SUMAtriptan (IMITREX) 50 MG tablet Take 1-2 tablets (50-100 mg total) by mouth every 2 (two) hours as needed for migraine (max dose 200 in 24 h). May repeat in 2 hours if headache persists or recurs.    No facility-administered encounter medications on file as of 03/03/2020.      Financial Resource Strain: High Risk  . Difficulty of Paying Living Expenses: Very hard    Current Diagnosis/Assessment:  Goals Addressed            This Visit's Progress   . Diabetes Mellitus - goal A1c < 7%       CARE PLAN ENTRY (see longitudinal plan of care for additional care plan information)  Current Barriers:  . Diabetes: type 2; complicated by chronic medical conditions including hyperlipidemia, chronic pain, financial strain Lab Results  Component Value Date   HGBA1C 7.2 (H) 11/14/2019 .   Lab Results  Component Value Date   CREATININE 0.89 11/14/2019   CREATININE 0.64 05/14/2019   CREATININE 0.58 07/09/2018 .   Marland Kitchen No  results found for: EGFR . Current antihyperglycemic regimen: Trulicity (out) . Denies hypoglycemic symptoms, including dizziness, lightheadedness, shaking, sweating . Denies hyperglycemic symptoms, including polyuria, polydipsia, polyphagia, nocturia, blurred vision, neuropathy . Current exercise: none . Current blood glucose readings: 119 - 130  Pharmacist Clinical Goal(s):  Marland Kitchen Over the next 90 days, patient will work with PharmD and primary care provider to address financial inability to access medications  Interventions: . Comprehensive medication review performed, medication list updated in electronic medical record . Inter-disciplinary care team collaboration (see longitudinal plan of care) . Patient assistance application for Trulicity . Recommended increased dose of Trulicity 1.'5mg'$  weekly to reach goal  Patient Self Care Activities:  . Patient will check blood glucose over the next 30 days when sweating , document, and provide at future appointments . Patient will focus on medication adherence by providing financial information . Patient will take medications as prescribed . Patient will contact provider with any episodes of hypoglycemia . Patient will report any questions or concerns to provider   Initial goal documentation       Diabetes   Recent Relevant Labs: Lab Results  Component Value Date/Time   HGBA1C 7.2 (H) 11/14/2019 12:06 PM   HGBA1C 6.6 03/31/2019 12:00 AM   HGBA1C 6.3 (H) 05/09/2018 09:50 AM   MICROALBUR 5.2 05/14/2019 12:00 AM   MICROALBUR 3.8 05/09/2018 09:50 AM   MICROALBUR 20 07/04/2016 10:36 AM     Patient has failed these meds in past: metformin Patient is currently uncontrolled on the following medications: Trulicity  Last diabetic Foot exam:  Lab Results  Component Value Date/Time   HMDIABEYEEXA No Retinopathy 12/27/2016 12:00 AM    Last diabetic Eye exam: No results found for: HMDIABFOOTEX   We discussed:   Trulicity doesn't decrease  appetite Need to increase dose, but may not have enough samples Will need to apply to Lilly PAP  Plan  Recommend increase Trulicity 1.'5mg'$  inj weekly Apply to Lilly PAP  Hyperlipidemia   LDL goal < 70  Lipid Panel     Component Value Date/Time   CHOL 164 11/14/2019 1206   TRIG 93 11/14/2019 1206   HDL 52 11/14/2019 1206   LDLCALC 93 11/14/2019 1206    Hepatic Function Latest Ref Rng & Units 02/18/2020 11/14/2019 05/14/2019  Total Protein 6.1 - 8.1 g/dL 6.6 6.7 6.8  Albumin 3.5 - 5.0 g/dL - - -  AST 10 - 35 U/L '17 17 23  '$ ALT 6 - 29 U/L '16 18 21  '$ Alk Phosphatase 38 - 126 U/L - - -  Total Bilirubin 0.2 - 1.2 mg/dL 0.5 0.5 0.3     The 10-year ASCVD risk score Mikey Bussing DC Jr., et al., 2013) is: 23.7%   Values used to calculate the score:     Age: 73 years     Sex: Female     Is Non-Hispanic African American:  Yes     Diabetic: Yes     Tobacco smoker: No     Systolic Blood Pressure: 826 mmHg     Is BP treated: Yes     HDL Cholesterol: 52 mg/dL     Total Cholesterol: 164 mg/dL   Patient has failed these meds in past: NA Patient is currently uncontrolled on the following medications:  . Crestor '40mg'$  daily  We discussed:   Denies myalgias Not at goal Trulicity may reduce caloric intake, improving lipids  Plan  Continue current medications   COPD / Asthma / Tobacco   Eosinophil count:   Lab Results  Component Value Date/Time   EOSPCT 4.2 02/18/2020 09:37 AM   EOSPCT 2.3 11/26/2013 08:48 AM  %                               Eos (Absolute):  Lab Results  Component Value Date/Time   EOSABS 176 02/18/2020 09:37 AM   EOSABS 0.1 11/26/2013 08:48 AM    Tobacco Status:  Social History   Tobacco Use  Smoking Status Former Smoker  . Types: Cigarettes  . Quit date: 2006  . Years since quitting: 15.5  Smokeless Tobacco Never Used  Tobacco Comment   quit smoking 52yr ago    Patient has failed these meds in past: Symbicort Patient is currently controlled on the  following medications: Breo Ellipta, Ventolin, Xyzal, Ocean Using maintenance inhaler regularly? Yes Frequency of rescue inhaler use:  infrequently  We discussed:   On takes propranolol once daily, makes breathing difficult Using Breo Ellipta samples. Added to med list  Plan  Will need Rx Breo Ellipta, likely PAP Continue current medications  Chronic Pain    Patient has failed these meds in past: NA Patient is currently uncontrolled on the following medications: Percocet, APAP, Imitrex  We discussed:  Only used Imitrex once Back, hip, HA Percocet 4 - 5 times daily Rains increases pain Denies acetaminophen  Pain is grabbing, sharp Trouble getting up and turning over in bed, excruciating  Plan   Recommend start acetaminophen '1000mg'$  twice daily scheduled Continue current medications  Medication Management   Pt uses [place] all medications Uses pill box? Yes Pt endorses 90% compliance Disadvantaged to [employer]  We discussed:   [place]emer family hx  Plan  Patient to provide proof of income, [employer] statement, front/back copy of Medicare Part D card Continue current medication management strategy  Follow up: 3 month phone visit  TMilus Height PharmD, [phone]

## 2020-03-04 NOTE — Patient Instructions (Addendum)
Visit Information  Goals Addressed            This Visit's Progress   . Diabetes Mellitus - goal A1c < 7%       CARE PLAN ENTRY (see longitudinal plan of care for additional care plan information)  Current Barriers:  . Diabetes: type 2; complicated by chronic medical conditions including hyperlipidemia, chronic pain, financial strain Lab Results  Component Value Date   HGBA1C 7.2 (H) 11/14/2019 .   Lab Results  Component Value Date   CREATININE 0.89 11/14/2019   CREATININE 0.64 05/14/2019   CREATININE 0.58 07/09/2018 .   Marland Kitchen No results found for: EGFR . Current antihyperglycemic regimen: Trulicity (out) . Denies hypoglycemic symptoms, including dizziness, lightheadedness, shaking, sweating . Denies hyperglycemic symptoms, including polyuria, polydipsia, polyphagia, nocturia, blurred vision, neuropathy . Current exercise: none . Current blood glucose readings: 119 - 130  Pharmacist Clinical Goal(s):  Marland Kitchen Over the next 90 days, patient will work with PharmD and primary care provider to address financial inability to access medications  Interventions: . Comprehensive medication review performed, medication list updated in electronic medical record . Inter-disciplinary care team collaboration (see longitudinal plan of care) . Patient assistance application for Trulicity . Recommended increased dose of Trulicity 1.'5mg'$  weekly to reach goal  Patient Self Care Activities:  . Patient will check blood glucose over the next 30 days when sweating , document, and provide at future appointments . Patient will focus on medication adherence by providing financial information . Patient will take medications as prescribed . Patient will contact provider with any episodes of hypoglycemia . Patient will report any questions or concerns to provider   Initial goal documentation        Print copy of patient instructions provided.   Telephone follow up appointment with pharmacy team member  scheduled for: 3 months  Milus Height, PharmD, [place], [facility] [phone]  What You Need to Know About Chronic Back Pain Long-term (chronic) back pain is back pain that lasts for 12 weeks or longer. It often affects the lower back and can range from mild to severe. Many people have back pain at some point in their lives. It can feel different to each person. It may feel like a muscle ache or a sharp, stabbing pain. The pain often gets worse over time. It can be difficult to find the cause of chronic back pain. Treating chronic back pain often starts with rest and pain relief, followed by exercises (physical therapy) to strengthen the muscles that support your back. You may have to try different things to see what works best for you. If other treatments do not help, or if your pain is caused by a condition or an injury, you may need surgery. How can back pain affect me? Chronic back pain is uncomfortable and can make it hard to do your usual daily activities. Chronic back pain can:  Cause numbness and tingling.  Come and go.  Get worse when you are sitting, standing, walking, bending, or lifting.  Affect you while you are active, at rest, or both.  Eventually make it hard to move around.  Occur with fever, weight loss, or difficulty urinating. What are the benefits of treating back pain? Treating chronic back pain may:  Relieve pain.  Keep your pain from getting worse.  Make it easier for you to do your usual activities. What are some steps I can take to decrease my back pain?   Take over-the-counter or  prescription medicines only as told by your health care provider.  If directed, apply heat to the affected area. Use the heat source that your health care provider recommends, such as a moist heat pack or a heating pad. ? Place a towel between your skin and the heat source. ? Leave the heat on for 20-30 minutes. ? Remove the heat if  your skin turns bright red. This is especially important if you are unable to feel pain, heat, or cold. You may have a greater risk of getting burned.  If directed, put ice on the affected area: ? Put ice in a plastic bag. ? Place a towel between your skin and the bag. ? Leave the ice on for 20 minutes, 2-3 times a day.  Get regular exercise as told by your health care provider to improve flexibility and strength.  Do not smoke.  Maintain a healthy weight.  When lifting objects: ? Keep your feet as far apart as your shoulders (shoulder-width apart) or farther apart. ? Tighten the muscles in your abdomen. ? Bend your knees and hips and keep your spine neutral. It is important to lift using the strength of your legs, not your back. Do not lock your knees straight out. ? Always ask for help to lift heavy or awkward objects. What can happen if my back pain goes untreated? Untreated back pain can:  Get worse over time.  Start to occur more often or at different times, such as when you are resting.  Cause posture problems.  Make it hard to move around (limit mobility). Where can I get support? Chronic back pain can be a frustrating condition to manage. It may help to talk with other people who are having a similar experience. Consider joining a support group for people dealing with chronic back pain. Ask your health care provider about support groups in your area. You can also find online and in-person support groups through:  The American Chronic Pain Association: DeluxeOption.si  The U.S. Pain Foundation: uspainfoundation.org/support-groups Contact a health care provider if:  Your symptoms do not get better or they get worse.  You have severe back pain.  You have chronic back pain and a fever.  You lose weight without trying.  You have difficulty urinating.  You experience numbness or tingling.  You develop new pain after an injury. Summary  Chronic  back pain is often treated with rest, pain relief, and physical therapy.  Get regular exercise to improve your strength and flexibility.  Put heat and ice on the affected areas as directed by your health care provider.  Chronic back pain can be challenging to live with. Joining a support group may help you manage your condition. This information is not intended to replace advice given to you by your health care provider. Make sure you discuss any questions you have with your health care provider. Document Revised: 07/21/2017 Document Reviewed: 04/16/2016 Elsevier Patient Education  2020 [employer].

## 2020-03-14 ENCOUNTER — Other Ambulatory Visit: Payer: Self-pay | Admitting: Family Medicine

## 2020-03-14 DIAGNOSIS — K219 Gastro-esophageal reflux disease without esophagitis: Secondary | ICD-10-CM

## 2020-03-24 ENCOUNTER — Other Ambulatory Visit: Payer: Self-pay | Admitting: Family Medicine

## 2020-03-24 DIAGNOSIS — F419 Anxiety disorder, unspecified: Secondary | ICD-10-CM

## 2020-03-24 DIAGNOSIS — F430 Acute stress reaction: Secondary | ICD-10-CM

## 2020-03-24 DIAGNOSIS — E7800 Pure hypercholesterolemia, unspecified: Secondary | ICD-10-CM

## 2020-03-25 ENCOUNTER — Telehealth (INDEPENDENT_AMBULATORY_CARE_PROVIDER_SITE_OTHER): Payer: Medicare HMO | Admitting: Family Medicine

## 2020-03-25 ENCOUNTER — Other Ambulatory Visit: Payer: Self-pay | Admitting: Family Medicine

## 2020-03-25 ENCOUNTER — Encounter: Payer: Self-pay | Admitting: Internal Medicine

## 2020-03-25 VITALS — Ht 63.5 in | Wt 185.0 lb

## 2020-03-25 DIAGNOSIS — J069 Acute upper respiratory infection, unspecified: Secondary | ICD-10-CM | POA: Diagnosis not present

## 2020-03-25 MED ORDER — BENZONATATE 100 MG PO CAPS: 100.0000 mg | ORAL_CAPSULE | Freq: Three times a day (TID) | ORAL | 0 refills | Status: DC | PRN

## 2020-03-25 NOTE — Progress Notes (Signed)
Name: Lori Potts   MRN: [id number]    DOB: 1946/04/13   Date:03/25/2020       Progress Note  Subjective:    Chief Complaint  Chief Complaint  Patient presents with  . Nasal Congestion    symptoms started yesterday, has taken nyquil  . Cough    I connected with  [name]  on 03/25/20 at 11:00 AM EDT by a video enabled telemedicine application and verified that I am speaking with the correct person using two identifiers.  I discussed the limitations of evaluation and management by telemedicine and the availability of in person appointments. The patient expressed understanding and agreed to proceed. Staff also discussed with the patient that there may be a patient responsible charge related to this service. Patient Location: home Provider Location: [facility] clinic  Additional Individuals present:    HPI Nasal sx, sinus congestion, scratchy throat and non productive cough, she doesn't feel good- generally, sx started yesterday, and slightly improved today.  Associated sinus HA across her forehead- pressure.  She is on AR meds, has been compliant with them.  She is around her grandchildren but no one is sick that she knows of.  She is vaccinated for COVID as of Jan. She denies myalgias, SOB, CP, sweats, fever, chest tightness/wheeze, loss of taste/smell.  She feels like its "just a sinus infection."  It has not triggered migraines. She has not tried any additional OTC meds.  Patient Active Problem List   Diagnosis Date Noted  . Diabetes mellitus with microalbuminuria (HCC) 12/03/2018  . Microalbuminuria 01/22/2018  . Anemia 09/22/2017  . S/P knee replacement 07/28/2017  . Allergic rhinitis 05/26/2017  . Vitamin D deficiency 05/19/2017  . Chronic nonintractable headache 05/18/2017  . Special screening for malignant neoplasms, colon   . Gastroesophageal reflux disease   . Gastritis without bleeding   . 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 Insomnia 11/29/2016  .  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 Vasomotor flushing 04/19/2016  . Abdominal wall pain in right flank 03/29/2016  . Diverticulosis 02/24/2016  . Hypercholesterolemia 06/16/2015  . FHx: migraine headaches 04/13/2015  . Chronic pain 04/13/2015  . Neck pain 02/26/2015  . Status post lumbar surgery 02/26/2015  . Abnormal ECG 01/27/2015  . Angina pectoris (HCC) 01/27/2015  . 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) 01/27/2015    Social History   Tobacco Use  . Smoking status: Former Smoker    Types: Cigarettes    Quit date: 2006    Years since quitting: 15.6  . Smokeless tobacco: Never Used  . Tobacco comment: quit smoking 34yrs ago  Substance Use Topics  . Alcohol use: No     Current Outpatient Medications:  .  albuterol (VENTOLIN HFA) 108 (90 Base) MCG/ACT inhaler, Inhale 1 puff into the lungs every 6 (six) hours as needed for wheezing or shortness of breath., Disp: 18 g, Rfl: 2 .  aspirin EC 81 MG tablet, Take 81 mg by mouth daily., Disp: , Rfl:  .  citalopram (CELEXA) 20 MG tablet, Take 1 tablet (20 mg total) by mouth daily. For nerves, stress, anxiety symptoms, Disp: 90 tablet, Rfl: 3 .  Dulaglutide (TRULICITY) 0.75 MG/0.5ML SOPN, Inject 0.75 mg into the skin once a week., Disp: 8 pen, Rfl: 1 .  esomeprazole (NEXIUM) 40 MG capsule, TAKE 1 CAPSULE (40 MG TOTAL) BY MOUTH DAILY AT 12 NOON., Disp: 30 capsule, Rfl: 1 .  fluticasone furoate-vilanterol (  BREO ELLIPTA) 100-25 MCG/INH AEPB, Inhale 1 puff into the lungs daily., Disp: , Rfl:  .  ipratropium-albuterol (DUONEB) 0.5-2.5 (3) MG/3ML SOLN, CAN TAKE EVERY 20 MINUTES FOR 2 DOSES AS NEEDED FOR WHEEZING, Disp: 90 mL, Rfl: 0 .  levocetirizine (XYZAL) 5 MG tablet, Take 1 tablet (5 mg total) by mouth every evening., Disp: 30 tablet, Rfl: 2 .  losartan (COZAAR) 25 MG tablet, TAKE 1 TABLET EVERY DAY, Disp: 90 tablet, Rfl: 3 .  oxyCODONE-acetaminophen (PERCOCET)  10-325 MG tablet, Take 1 tablet by mouth 5 (five) times daily. , Disp: , Rfl: 0 .  promethazine (PHENERGAN) 25 MG tablet, Take 1 tablet (25 mg total) by mouth every 8 (eight) hours as needed for nausea or vomiting., Disp: 20 tablet, Rfl: 2 .  propranolol (INDERAL) 10 MG tablet, TAKE 1 TABLET BY MOUTH THREE TIMES A DAY, Disp: 270 tablet, Rfl: 3 .  rosuvastatin (CRESTOR) 40 MG tablet, Take 1 tablet (40 mg total) by mouth daily., Disp: 90 tablet, Rfl: 3 .  sodium chloride (OCEAN) 0.65 % SOLN nasal spray, Place 1 spray into both nostrils 2 (two) times daily as needed for congestion., Disp: 60 mL, Rfl: 5 .  SUMAtriptan (IMITREX) 50 MG tablet, Take 1-2 tablets (50-100 mg total) by mouth every 2 (two) hours as needed for migraine (max dose 200 in 24 h). May repeat in 2 hours if headache persists or recurs., Disp: 10 tablet, Rfl: 2  Allergies  Allergen Reactions  . Metformin And Related Diarrhea  . Adhesive [Tape] Rash and Other (See Comments)    Regular tape is ok, allergy is to paper tape    I personally reviewed active problem list, medication list, allergies, family history, social history, health maintenance, notes from last encounter, lab results, imaging with the patient/caregiver today.   Review of Systems  10 Systems reviewed and are negative for acute change except as noted in the HPI.   Objective:   Virtual encounter, vitals limited, only able to obtain the following Today's Vitals   03/25/20 1048  Weight: 185 lb (83.9 kg)  Height: 5' 3.5" (1.613 m)   Body mass index is 32.26 kg/m. Nursing Note and Vital Signs reviewed.  Physical Exam Vitals and nursing note reviewed.  Constitutional:      General: She is not in acute distress.    Appearance: Normal appearance. She is well-developed. She is not ill-appearing, toxic-appearing or diaphoretic.     Comments: Well appearing  HENT:     Head: Normocephalic and atraumatic.  Eyes:     General:        Right eye: No discharge.          Left eye: No discharge.     Conjunctiva/sclera: Conjunctivae normal.  Neck:     Trachea: No tracheal deviation.  Cardiovascular:     Rate and Rhythm: Normal rate.  Pulmonary:     Effort: Pulmonary effort is normal. No respiratory distress.     Breath sounds: No stridor.     Comments: Occasional wet sounding cough, no observed tachypnea/no audible wheeze Skin:    Coloration: Skin is not jaundiced or pale.     Findings: No rash.  Neurological:     Mental Status: She is alert.  Psychiatric:        Mood and Affect: Mood normal.        Behavior: Behavior normal.     PE limited by telephone encounter  No results found for this or any previous visit (from  the past 72 hour(s)).  Assessment and Plan:    Patient is a 74 year old female with history of chronic nasal allergies, she presents with acute URI symptoms onset yesterday described as nasal discharge, congestion, sinus pressure, scratchy throat and nonproductive cough and generalized malaise but denies any fever chills sweats shortness of breath, loss of taste or smell and no known sick contacts.  She is vaccinated for Covid as of January.  She does feel that her symptoms have improved a little bit today, slightly better than yesterday and she has not tried any additional over-the-counter medicine in addition to her chronic nasal allergy meds.  She was encouraged to get Covid tested with PCR testing due to uptake in cases and delta variant despite being vaccinated.  She was encouraged to quarantine.  Encouraged her to try over-the-counter medications such as Mucinex, Coricidin, Tylenol, ibuprofen and continue her other allergy medicines.  She does have palpitations and history of sweats she may not tolerate Sudafed very well, we discussed how antibiotics at this point are not indicated and likely ineffective for viral illness and will only cause antibiotic side effects and that she should stop treat her symptoms and rest and hydrate.   Explained that she would need follow-up appointment if she has any worsening or if she has a Covid positive test.     ICD-10-CM   1. Upper respiratory tract infection, unspecified type  J06.9 benzonatate (TESSALON) 100 MG capsule    Staff to call her to help give info for testing    -Red flags and when to present for emergency care or RTC including fever >101.46F, chest pain, shortness of breath, new/worsening/un-resolving symptoms, reviewed with patient at time of visit. Follow up and care instructions discussed and provided in AVS. - I discussed the assessment and treatment plan with the patient. The patient was provided an opportunity to ask questions and all were answered. The patient agreed with the plan and demonstrated an understanding of the instructions.  I provided 20+ minutes of non-face-to-face time during this encounter.  Danelle Berry, PA-C 03/25/20 11:25 AM

## 2020-03-30 ENCOUNTER — Other Ambulatory Visit: Payer: Medicare HMO

## 2020-04-09 DIAGNOSIS — Z79899 Other long term (current) drug therapy: Secondary | ICD-10-CM | POA: Diagnosis not present

## 2020-04-09 DIAGNOSIS — Z1329 Encounter for screening for other suspected endocrine disorder: Secondary | ICD-10-CM | POA: Diagnosis not present

## 2020-04-09 DIAGNOSIS — E119 Type 2 diabetes mellitus without complications: Secondary | ICD-10-CM | POA: Diagnosis not present

## 2020-04-09 DIAGNOSIS — G43119 Migraine with aura, intractable, without status migrainosus: Secondary | ICD-10-CM | POA: Diagnosis not present

## 2020-04-09 DIAGNOSIS — E538 Deficiency of other specified B group vitamins: Secondary | ICD-10-CM | POA: Diagnosis not present

## 2020-04-09 DIAGNOSIS — E559 Vitamin D deficiency, unspecified: Secondary | ICD-10-CM | POA: Diagnosis not present

## 2020-04-12 ENCOUNTER — Other Ambulatory Visit: Payer: Self-pay | Admitting: Family Medicine

## 2020-04-12 DIAGNOSIS — K219 Gastro-esophageal reflux disease without esophagitis: Secondary | ICD-10-CM

## 2020-04-18 DIAGNOSIS — G43119 Migraine with aura, intractable, without status migrainosus: Secondary | ICD-10-CM | POA: Insufficient documentation

## 2020-04-28 DIAGNOSIS — M5416 Radiculopathy, lumbar region: Secondary | ICD-10-CM | POA: Diagnosis not present

## 2020-06-02 DIAGNOSIS — E119 Type 2 diabetes mellitus without complications: Secondary | ICD-10-CM | POA: Diagnosis not present

## 2020-06-02 LAB — HM DIABETES EYE EXAM

## 2020-06-04 ENCOUNTER — Ambulatory Visit: Payer: Self-pay

## 2020-06-18 ENCOUNTER — Ambulatory Visit: Payer: Medicare HMO | Admitting: Pharmacist

## 2020-06-18 ENCOUNTER — Other Ambulatory Visit: Payer: Self-pay

## 2020-06-18 NOTE — Chronic Care Management (AMB) (Signed)
Chronic Care Management Pharmacy  Name: Lori Potts  MRN: [id number] DOB: [birth year]  Chief Complaint/ HPI  Lori Potts,  74 y.o. , female presents for their Follow-Up CCM visit with the clinical pharmacist via telephone due to COVID-19 Pandemic.  PCP : Lori Berry, PA-C  Their chronic conditions include: HTN, HLD, DM, Migraines  Office Visits: NA  Consult Visit: NA  Medications: Outpatient Encounter Medications as of 06/18/2020  Medication Sig Note  . albuterol (VENTOLIN HFA) 108 (90 Base) MCG/ACT inhaler Inhale 1 puff into the lungs every 6 (six) hours as needed for wheezing or shortness of breath.   . Alcohol Swabs (B-D SINGLE USE SWABS REGULAR) PADS USE 2 (TWO) TIMES DAILY.   Marland Kitchen aspirin EC 81 MG tablet Take 81 mg by mouth daily.   . benzonatate (TESSALON) 100 MG capsule Take 1 capsule (100 mg total) by mouth 3 (three) times daily as needed for cough.   . citalopram (CELEXA) 10 MG tablet TAKE 1 TABLET (10 MG TOTAL) BY MOUTH DAILY. FOR NERVES, STRESS, ANXIETY SYMPTOMS   . citalopram (CELEXA) 20 MG tablet Take 1 tablet (20 mg total) by mouth daily. For nerves, stress, anxiety symptoms   . Dulaglutide (TRULICITY) 0.75 MG/0.5ML SOPN Inject 0.75 mg into the skin once a week.   . esomeprazole (NEXIUM) 40 MG capsule TAKE 1 CAPSULE (40 MG TOTAL) BY MOUTH DAILY AT 12 NOON.   . fluticasone furoate-vilanterol (BREO ELLIPTA) 100-25 MCG/INH AEPB Inhale 1 puff into the lungs daily.   Marland Kitchen ipratropium-albuterol (DUONEB) 0.5-2.5 (3) MG/3ML SOLN CAN TAKE EVERY 20 MINUTES FOR 2 DOSES AS NEEDED FOR WHEEZING   . levocetirizine (XYZAL) 5 MG tablet Take 1 tablet (5 mg total) by mouth every evening.   Marland Kitchen losartan (COZAAR) 25 MG tablet TAKE 1 TABLET EVERY DAY   . oxyCODONE-acetaminophen (PERCOCET) 10-325 MG tablet Take 1 tablet by mouth 5 (five) times daily.  06/12/2019: prn  . promethazine (PHENERGAN) 25 MG tablet Take 1 tablet (25 mg total) by mouth every 8 (eight) hours as needed for  nausea or vomiting.   . propranolol (INDERAL) 10 MG tablet TAKE 1 TABLET BY MOUTH THREE TIMES A DAY   . rosuvastatin (CRESTOR) 40 MG tablet TAKE 1 TABLET BY MOUTH EVERY DAY   . sodium chloride (OCEAN) 0.65 % SOLN nasal spray Place 1 spray into both nostrils 2 (two) times daily as needed for congestion.   . SUMAtriptan (IMITREX) 50 MG tablet Take 1-2 tablets (50-100 mg total) by mouth every 2 (two) hours as needed for migraine (max dose 200 in 24 h). May repeat in 2 hours if headache persists or recurs.    No facility-administered encounter medications on file as of 06/18/2020.     Financial Resource Strain: High Risk  . Difficulty of Paying Living Expenses: Very hard    Current Diagnosis/Assessment:  Goals Addressed            This Visit's Progress   . Diabetes Mellitus - goal A1c < 7%       CARE PLAN ENTRY (see longitudinal plan of care for additional care plan information)  Current Barriers:  . Diabetes: type 2; complicated by chronic medical conditions including hyperlipidemia, chronic pain, financial strain Lab Results  Component Value Date   HGBA1C 7.2 (H) 11/14/2019 .   Lab Results  Component Value Date   CREATININE 0.89 11/14/2019   CREATININE 0.64 05/14/2019   CREATININE 0.58 07/09/2018 .   Marland Kitchen No results found for: EGFR .  Current antihyperglycemic regimen: Trulicity 0.75mg  weekly . Denies hypoglycemic symptoms, including dizziness, lightheadedness, shaking, sweating . Denies hyperglycemic symptoms, including polyuria, polydipsia, polyphagia, nocturia, blurred vision, neuropathy . Current exercise: none . Current blood glucose readings: 119 - 130  Pharmacist Clinical Goal(s):  Marland Kitchen Over the next 90 days, patient will work with PharmD and primary care provider to address financial inability to access medications  Interventions: . Comprehensive medication review performed, medication list updated in electronic medical record . Inter-disciplinary care team  collaboration (see longitudinal plan of care) . Patient assistance application for Trulicity . Recommended increased dose of Trulicity 1.5mg  weekly to reach goal  Patient Self Care Activities:  . Patient will check blood glucose over the next 30 days when sweating , document, and provide at future appointments . Patient will focus on medication adherence by providing financial information . Patient will take medications as prescribed . Patient will contact provider with any episodes of hypoglycemia . Patient will report any questions or concerns to provider   Initial goal documentation       Diabetes   Recent Relevant Labs: Lab Results  Component Value Date/Time   HGBA1C 7.2 (H) 11/14/2019 12:06 PM   HGBA1C 6.6 03/31/2019 12:00 AM   HGBA1C 6.3 (H) 05/09/2018 09:50 AM   MICROALBUR 5.2 05/14/2019 12:00 AM   MICROALBUR 3.8 05/09/2018 09:50 AM   MICROALBUR 20 07/04/2016 10:36 AM     Checking BG: Rarely  Patient has failed these meds in past: oral hypoglycemics Patient is currently uncontrolled on the following medications: Trulicity 0.75mg  qwk  Last diabetic Foot exam:  Lab Results  Component Value Date/Time   HMDIABEYEEXA No Retinopathy 12/27/2016 12:00 AM    Last diabetic Eye exam: No results found for: HMDIABFOOTEX   We discussed: Relying on samples. Unable to increase Trulicity  Plan  Continue current medications   Medication Management   We discussed:   Breo Ellipta? Samples Acetaminophen  Can't get proof of income from [place] Got eye exam Now taking propranolol 10mg  three times daily. Frequent migraines. Lost son in law to MI (age 45), 2 weeks ago  Plan  Recommended increase propranolol 20mg  tid Continue current medication management strategy  Follow up: 3 month phone visit  Milus Height, PharmD, [place], [facility] [phone]

## 2020-06-26 ENCOUNTER — Other Ambulatory Visit: Payer: Self-pay | Admitting: Family Medicine

## 2020-06-26 DIAGNOSIS — G8929 Other chronic pain: Secondary | ICD-10-CM

## 2020-07-01 DIAGNOSIS — M25511 Pain in right shoulder: Secondary | ICD-10-CM | POA: Diagnosis not present

## 2020-07-21 ENCOUNTER — Telehealth: Payer: Self-pay | Admitting: Family Medicine

## 2020-07-21 DIAGNOSIS — R519 Headache, unspecified: Secondary | ICD-10-CM

## 2020-07-21 DIAGNOSIS — G8929 Other chronic pain: Secondary | ICD-10-CM

## 2020-07-21 DIAGNOSIS — K219 Gastro-esophageal reflux disease without esophagitis: Secondary | ICD-10-CM

## 2020-07-22 DIAGNOSIS — M25511 Pain in right shoulder: Secondary | ICD-10-CM | POA: Diagnosis not present

## 2020-07-22 NOTE — Telephone Encounter (Signed)
lvm for pt to call and schedule an appt °

## 2020-07-30 DIAGNOSIS — M25511 Pain in right shoulder: Secondary | ICD-10-CM | POA: Diagnosis not present

## 2020-08-13 ENCOUNTER — Other Ambulatory Visit: Payer: Self-pay | Admitting: Physician Assistant

## 2020-08-13 DIAGNOSIS — M25511 Pain in right shoulder: Secondary | ICD-10-CM

## 2020-08-20 ENCOUNTER — Ambulatory Visit
Admission: RE | Admit: 2020-08-20 | Discharge: 2020-08-20 | Disposition: A | Discharge status: Home / Self Care | Payer: Medicare HMO | Source: Ambulatory Visit | Attending: Physician Assistant | Admitting: Physician Assistant

## 2020-08-20 ENCOUNTER — Other Ambulatory Visit: Payer: Self-pay | Admitting: Emergency Medicine

## 2020-08-20 ENCOUNTER — Telehealth: Payer: Self-pay | Admitting: *Deleted

## 2020-08-20 ENCOUNTER — Other Ambulatory Visit: Payer: Self-pay

## 2020-08-20 DIAGNOSIS — Z794 Long term (current) use of insulin: Secondary | ICD-10-CM

## 2020-08-20 DIAGNOSIS — M19011 Primary osteoarthritis, right shoulder: Secondary | ICD-10-CM | POA: Diagnosis not present

## 2020-08-20 DIAGNOSIS — M75121 Complete rotator cuff tear or rupture of right shoulder, not specified as traumatic: Secondary | ICD-10-CM | POA: Insufficient documentation

## 2020-08-20 DIAGNOSIS — M25511 Pain in right shoulder: Secondary | ICD-10-CM | POA: Diagnosis not present

## 2020-08-20 DIAGNOSIS — M75101 Unspecified rotator cuff tear or rupture of right shoulder, not specified as traumatic: Secondary | ICD-10-CM | POA: Diagnosis not present

## 2020-08-20 DIAGNOSIS — G8929 Other chronic pain: Secondary | ICD-10-CM | POA: Diagnosis not present

## 2020-08-20 MED ORDER — LIDOCAINE HCL (PF) 1 % IJ SOLN
5.0000 mL | Freq: Once | INTRAMUSCULAR | Status: AC
  Administered 2020-08-20: 5 mL
  Filled 2020-08-20: qty 5

## 2020-08-20 MED ORDER — SODIUM CHLORIDE (PF) 0.9 % IJ SOLN
7.0000 mL | Freq: Once | INTRAMUSCULAR | Status: AC
  Administered 2020-08-20: 7 mL

## 2020-08-20 MED ORDER — TRULICITY 0.75 MG/0.5ML ~~LOC~~ SOAJ
0.7500 mg | SUBCUTANEOUS | 1 refills | Status: DC
Start: 2020-08-20 — End: 2021-05-26

## 2020-08-20 MED ORDER — IOHEXOL 180 MG/ML  SOLN
13.0000 mL | Freq: Once | INTRAMUSCULAR | Status: AC | PRN
  Administered 2020-08-20: 13 mL via INTRA_ARTICULAR

## 2020-08-20 NOTE — Chronic Care Management (AMB) (Signed)
  Care Management   Note  08/20/2020 Name: [name] MRN: [id number] DOB: Oct 24, 1945  Lori Potts is a 74 y.o. year old female who is a primary care patient of Sander Radon and is actively engaged with the care management team. I reached out to Lori Potts by phone today to assist with scheduling a follow up visit with the Pharmacist  Follow up plan: Telephone appointment with care management team member scheduled for:08/26/2020  [facility] Guide, Embedded Care Coordination [facility]  Care Management  Direct Dial: [phone]

## 2020-08-25 ENCOUNTER — Telehealth: Payer: Self-pay

## 2020-08-25 NOTE — Progress Notes (Signed)
08/25/2020- Called patient to remind of follow up visit with [name], [facility] on 1/5/022 at 1:00pm. No answer on home number, left message on mobile number of appointment date and time.  Billee Cashing, CMA Clinical Pharmacist Assistant [phone]

## 2020-08-26 ENCOUNTER — Ambulatory Visit: Payer: Medicare HMO

## 2020-08-26 DIAGNOSIS — I10 Essential (primary) hypertension: Secondary | ICD-10-CM

## 2020-08-26 DIAGNOSIS — E0829 Diabetes mellitus due to underlying condition with other diabetic kidney complication: Secondary | ICD-10-CM

## 2020-08-26 NOTE — Patient Instructions (Signed)
Visit Information It was great speaking with you today!  Please let me know if you have any questions about our visit. Goals Addressed            This Visit's Progress   . Chronic Care Management       CARE PLAN ENTRY (see longitudinal plan of care for additional care plan information)  Current Barriers:  . Chronic Disease Management support, education, and care coordination needs related to Hypertension, Hyperlipidemia, Diabetes, Coronary Artery Disease, GERD, and Allergic Rhinitis   Hypertension BP Readings from Last 3 Encounters:  02/18/20 118/72  11/14/19 136/64  11/05/19 138/64   . Pharmacist Clinical Goal(s): o Over the next 90 days, patient will work with PharmD and providers to maintain BP goal <130/80 . Current regimen:  o Losartan 25 mg daily  Propanolol 10 mg three times daily  . Interventions: o Discussed low salt diet and exercising as tolerated extensively o Will initiate blood pressure monitoring plan  . Patient self care activities - Over the next 90 days, patient will: o Check blood pressure 1-2 times weekly, document, and provide at future appointments o Ensure daily salt intake < 2300 mg/day  Hyperlipidemia Lab Results  Component Value Date/Time   LDLCALC 93 11/14/2019 12:06 PM   . Pharmacist Clinical Goal(s): o Over the next 90 days, patient will work with PharmD and providers to maintain LDL goal < 70 . Current regimen:  o Rosuvastatin 40 mg daily  . Interventions: o Discussed low cholesterol diet and exercising as tolerated extensively o Will initiate cholesterol monitoring plan   Diabetes Lab Results  Component Value Date/Time   HGBA1C 7.2 (H) 11/14/2019 12:06 PM   HGBA1C 6.6 03/31/2019 12:00 AM   HGBA1C 6.3 (H) 05/09/2018 09:50 AM   . Pharmacist Clinical Goal(s): o Over the next 90 days, patient will work with PharmD and providers to achieve A1c goal <7% . Current regimen:  o Trulicity 0.75 mg weekly . Interventions: o Discussed  carbohydrate counting and exercising as tolerated extensively o Will initiate blood sugar monitoring plan  o Will start PAP Application for Trulicity for 2022.  Marland Kitchen Patient self care activities - Over the next 90 days, patient will: o Check blood sugar once daily, document, and provide at future appointments o Contact provider with any episodes of hypoglycemia o Provide the necessary supplementary documents (proof of household income) needed for medication assistance applications to CCM pharmacist.   Medication management . Pharmacist Clinical Goal(s): o Over the next 90 days, patient will work with PharmD and providers to maintain optimal medication adherence . [facility] . Interventions o Comprehensive medication review performed. o Continue current medication management strategy . Patient self care activities - Over the next 90 days, patient will o Take medications as prescribed o Report any questions or concerns to PharmD and/or provider(s)       The patient verbalized understanding of instructions, educational materials, and care plan provided today and declined offer to receive copy of patient instructions, educational materials, and care plan.   Telephone follow up appointment with pharmacy team member scheduled for: 02/25/20 at 1:00 PM   Garey Ham Clinical Pharmacist [facility] [phone]

## 2020-08-26 NOTE — Chronic Care Management (AMB) (Signed)
Chronic Care Management Pharmacy  Name: Lori Potts  MRN: [id number] DOB: 07-Dec-1945  Chief Complaint/ HPI  Lori Potts,  75 y.o. , female presents for their Follow-Up CCM visit with the clinical pharmacist via telephone due to COVID-19 Pandemic.  PCP : Delsa Grana, PA-C  Their chronic conditions include: Hypertension, Hyperlipidemia, Diabetes, Coronary Artery Disease, GERD, and Allergic Rhinitis   Office Visits: 03/25/20: Video visit with Delsa Grana, PA-C for URI. Symbicort stopped. Patient started on benzonatate.   Consult Visit: NA  Medications: Outpatient Encounter Medications as of 08/26/2020  Medication Sig Note  . albuterol (VENTOLIN HFA) 108 (90 Base) MCG/ACT inhaler Inhale 1 puff into the lungs every 6 (six) hours as needed for wheezing or shortness of breath.   . Alcohol Swabs (B-D SINGLE USE SWABS REGULAR) PADS USE 2 (TWO) TIMES DAILY.   Marland Kitchen aspirin EC 81 MG tablet Take 81 mg by mouth daily.   . benzonatate (TESSALON) 100 MG capsule Take 1 capsule (100 mg total) by mouth 3 (three) times daily as needed for cough.   . citalopram (CELEXA) 20 MG tablet Take 1 tablet (20 mg total) by mouth daily. For nerves, stress, anxiety symptoms   . Dulaglutide (TRULICITY) 9.16 BW/4.6KZ SOPN Inject 0.75 mg into the skin once a week.   . esomeprazole (NEXIUM) 40 MG capsule Take 1 capsule (40 mg total) by mouth daily at 12 noon.   . fluticasone furoate-vilanterol (BREO ELLIPTA) 100-25 MCG/INH AEPB Inhale 1 puff into the lungs daily.   Marland Kitchen ipratropium-albuterol (DUONEB) 0.5-2.5 (3) MG/3ML SOLN CAN TAKE 3ML EVERY 20 MINUTES FOR 2 DOSES AS NEEDED FOR WHEEZING   . levocetirizine (XYZAL) 5 MG tablet Take 1 tablet (5 mg total) by mouth every evening.   Marland Kitchen losartan (COZAAR) 25 MG tablet TAKE 1 TABLET EVERY DAY   . oxyCODONE-acetaminophen (PERCOCET) 10-325 MG tablet Take 1 tablet by mouth 5 (five) times daily.  06/12/2019: prn  . promethazine (PHENERGAN) 25 MG tablet Take 1 tablet (25 mg total)  by mouth every 8 (eight) hours as needed for nausea or vomiting.   . propranolol (INDERAL) 10 MG tablet TAKE 1 TABLET BY MOUTH THREE TIMES A DAY   . rosuvastatin (CRESTOR) 40 MG tablet TAKE 1 TABLET BY MOUTH EVERY DAY   . sodium chloride (OCEAN) 0.65 % SOLN nasal spray Place 1 spray into both nostrils 2 (two) times daily as needed for congestion.   . SUMAtriptan (IMITREX) 50 MG tablet TAKE 1-2 TABLETS BY MOUTH AS NEEDED FOR MIGRAINE MAY REPEAT IN 2 HOURS IF NEEDED. MAX 4/24 HOURS   . [DISCONTINUED] citalopram (CELEXA) 10 MG tablet TAKE 1 TABLET (10 MG TOTAL) BY MOUTH DAILY. FOR NERVES, STRESS, ANXIETY SYMPTOMS    No facility-administered encounter medications on file as of 08/26/2020.   Current Diagnosis/Assessment:  SDOH Interventions   Flowsheet Row Most Recent Value  SDOH Interventions   Financial Strain Interventions Other (Comment)  [Will start PAP]  Transportation Interventions Intervention Not Indicated     Goals Addressed            This Visit's Progress   . Chronic Care Management       CARE PLAN ENTRY (see longitudinal plan of care for additional care plan information)  Current Barriers:  . Chronic Disease Management support, education, and care coordination needs related to Hypertension, Hyperlipidemia, Diabetes, Coronary Artery Disease, GERD, and Allergic Rhinitis   Hypertension BP Readings from Last 3 Encounters:  02/18/20 118/72  11/14/19 136/64  11/05/19 138/64   .  Pharmacist Clinical Goal(s): o Over the next 90 days, patient will work with PharmD and providers to maintain BP goal <130/80 . Current regimen:  o Losartan 25 mg daily  Propanolol 10 mg three times daily  . Interventions: o Discussed low salt diet and exercising as tolerated extensively o Will initiate blood pressure monitoring plan  . Patient self care activities - Over the next 90 days, patient will: o Check blood pressure 1-2 times weekly, document, and provide at future appointments o Ensure  daily salt intake < 2300 mg/day  Hyperlipidemia Lab Results  Component Value Date/Time   LDLCALC 93 11/14/2019 12:06 PM   . Pharmacist Clinical Goal(s): o Over the next 90 days, patient will work with PharmD and providers to maintain LDL goal < 70 . Current regimen:  o Rosuvastatin 40 mg daily  . Interventions: o Discussed low cholesterol diet and exercising as tolerated extensively o Will initiate cholesterol monitoring plan   Diabetes Lab Results  Component Value Date/Time   HGBA1C 7.2 (H) 11/14/2019 12:06 PM   HGBA1C 6.6 03/31/2019 12:00 AM   HGBA1C 6.3 (H) 05/09/2018 09:50 AM   . Pharmacist Clinical Goal(s): o Over the next 90 days, patient will work with PharmD and providers to achieve A1c goal <7% . Current regimen:  o Trulicity 2.70 mg weekly . Interventions: o Discussed carbohydrate counting and exercising as tolerated extensively o Will initiate blood sugar monitoring plan  o Will start PAP Application for Trulicity for 6237.  Marland Kitchen Patient self care activities - Over the next 90 days, patient will: o Check blood sugar once daily, document, and provide at future appointments o Contact provider with any episodes of hypoglycemia o Provide the necessary supplementary documents (proof of household income) needed for medication assistance applications to CCM pharmacist.   Medication management . Pharmacist Clinical Goal(s): o Over the next 90 days, patient will work with PharmD and providers to maintain optimal medication adherence . [facility] . Interventions o Comprehensive medication review performed. o Continue current medication management strategy . Patient self care activities - Over the next 90 days, patient will o Take medications as prescribed o Report any questions or concerns to PharmD and/or provider(s)      Hypertension   BP goal is:  <130/80  Office blood pressures are  BP Readings from Last 3 Encounters:  02/18/20  118/72  11/14/19 136/64  11/05/19 138/64   Patient checks BP at home weekly Patient home BP readings are ranging: NA  Patient has failed these meds in the past: NA Patient is currently controlled on the following medications:  . Losartan 25 mg daily  . Propanolol 10 mg three times daily   We discussed diet and exercise extensively  Plan  Continue current medications     Hyperlipidemia   LDL goal < 70  Last lipids Lab Results  Component Value Date   CHOL 164 11/14/2019   HDL 52 11/14/2019   LDLCALC 93 11/14/2019   TRIG 93 11/14/2019   CHOLHDL 3.2 11/14/2019   Hepatic Function Latest Ref Rng & Units 02/18/2020 11/14/2019 05/14/2019  Total Protein 6.1 - 8.1 g/dL 6.6 6.7 6.8  Albumin 3.5 - 5.0 g/dL - - -  AST 10 - 35 U/L $Remo'17 17 23  'iHBEp$ ALT 6 - 29 U/L $Remo'16 18 21  'UeDxk$ Alk Phosphatase 38 - 126 U/L - - -  Total Bilirubin 0.2 - 1.2 mg/dL 0.5 0.5 0.3     The 10-year ASCVD risk score (  Renaye Rakers., et al., 2013) is: 35.6%   Values used to calculate the score:     Age: 15 years     Sex: Female     Is Non-Hispanic African American: Yes     Diabetic: Yes     Tobacco smoker: No     Systolic Blood Pressure: 384 mmHg     Is BP treated: Yes     HDL Cholesterol: 52 mg/dL     Total Cholesterol: 164 mg/dL   Patient has failed these meds in past: NA Patient is currently controlled on the following medications:  . Aspirin 81 mg daily  . Rosuvastatin 40 mg daily   We discussed:  diet and exercise extensively  Plan  Continue current medications  Diabetes   A1c goal <7%  Recent Relevant Labs: Lab Results  Component Value Date/Time   HGBA1C 7.2 (H) 11/14/2019 12:06 PM   HGBA1C 6.6 03/31/2019 12:00 AM   HGBA1C 6.3 (H) 05/09/2018 09:50 AM   MICROALBUR 5.2 05/14/2019 12:00 AM   MICROALBUR 3.8 05/09/2018 09:50 AM   MICROALBUR 20 07/04/2016 10:36 AM    Last diabetic Eye exam:  Lab Results  Component Value Date/Time   HMDIABEYEEXA No Retinopathy 12/27/2016 12:00 AM    Last diabetic  Foot exam: No results found for: HMDIABFOOTEX   Checking BG: Daily  Date Fasting  Comments   5-Jan 132   4-Jan 665 Restarted Trulicity  3-Jan 87   2-Jan 128   1-Jan 187   31-Dec 238   Average 167    Patient has failed these meds in past: Levemir, Xultophy, Victoza, Onglyza, Januvia, Ozempic, Metformin (diarrhea)  Patient is currently uncontrolled on the following medications: . Trulicity 9.93 mg weekly   We discussed: Patient ran out of Trulicity due to not being able to afford a refill.She states that she was able to get a 57SV of Trulicity and get it refilled, restarting the medication on 08/25/20. She never received previous PAP paperwork.   Plan  Continue current medications  Will restart PAP application for 7793  Medication Management   [facility]  Uses pill box? Yes Pt endorses 100% compliance  We discussed: Current pharmacy is preferred with insurance plan and patient is satisfied with pharmacy services  Plan  Continue current medication management strategy  Follow up: 6 month phone visit  [facility] [phone]

## 2020-08-27 ENCOUNTER — Telehealth: Payer: Self-pay

## 2020-08-27 NOTE — Chronic Care Management (AMB) (Signed)
    Chronic Care Management Pharmacy Assistant   Name: Lori Potts  MRN: [id number] DOB: Oct 30, 1945  Reason for Encounter: Patient Assistance Coordination  PCP : Danelle Berry, PA-C   08/27/2020- Patient assistance paperwork fill out for Trulicity 0.75mg /0.5 ml with [employer] Patient Assistance Program. Called patient and informed that I am mailing paperwork to her and if she could fill out income informaton, place her signatures and bring paperwork along with a copy of her income documentation to her PCP office for her provider to sign. Patient aware and agrees with plan. Patient was very thankful for the help.  Follow-Up:  Patient Assistance Coordination

## 2020-08-28 DIAGNOSIS — M75121 Complete rotator cuff tear or rupture of right shoulder, not specified as traumatic: Secondary | ICD-10-CM | POA: Diagnosis not present

## 2020-09-02 ENCOUNTER — Telehealth: Payer: Self-pay | Admitting: Family Medicine

## 2020-09-02 NOTE — Telephone Encounter (Signed)
Left message for [facility] to fax paper work over. We have not received at this time

## 2020-09-02 NOTE — Telephone Encounter (Signed)
Called to confirm that the office received a medical clearance for for patient that was sent on 01/10.  Please call to discuss at [phone], ext. [phone]

## 2020-09-03 ENCOUNTER — Other Ambulatory Visit: Payer: Self-pay | Admitting: Orthopedic Surgery

## 2020-09-03 NOTE — Progress Notes (Signed)
Pt need routine OV appt with PCP

## 2020-09-04 NOTE — Progress Notes (Signed)
Called pt to get her scheduled and she stated she is having shoulder surgery on 09/17/2020. Pt ask for [place] to call if we get a cancellation before her surgery

## 2020-09-08 NOTE — Telephone Encounter (Signed)
Lori Potts, from [facility], calling to check on clearance form that was sent on 08/31/20. She states that she is faxing it over again today. Please advise.      [phone] ext [phone]

## 2020-09-08 NOTE — Telephone Encounter (Signed)
lvm for Lori Potts to resend the paperwork because we have not seen it. Please refax to [phone] Attn: Myriam Jacobson

## 2020-09-08 NOTE — Telephone Encounter (Signed)
We have not received any paper work. Can you please call and verify they got right fax number

## 2020-09-10 ENCOUNTER — Other Ambulatory Visit
Admission: RE | Admit: 2020-09-10 | Discharge: 2020-09-10 | Disposition: A | Discharge status: Home / Self Care | Payer: Medicare HMO | Source: Ambulatory Visit | Attending: Orthopedic Surgery | Admitting: Orthopedic Surgery

## 2020-09-10 ENCOUNTER — Other Ambulatory Visit: Payer: Self-pay

## 2020-09-10 HISTORY — DX: Anxiety disorder, unspecified: F41.9

## 2020-09-10 NOTE — Patient Instructions (Signed)
Your procedure is scheduled on: Thursday September 17, 2020. Report to [facility] inside [facility] 2nd floor (stop by admissions desk first before going upstairs). To find out your arrival time please call [phone] between 1PM - 3PM on Wednesday September 16, 2020.  Remember: Instructions that are not followed completely may result in serious medical risk,  up to and including death, or upon the discretion of your surgeon and anesthesiologist your  surgery may need to be rescheduled.     _X__ 1. Do not eat food or drink liquids after midnight the night before your procedure.                 No chewing gum or hard candies.   __X__2.  On the morning of surgery brush your teeth with toothpaste and water, you                may rinse your mouth with mouthwash if you wish.  Do not swallow any toothpaste of mouthwash.     _X__ 3.  No Alcohol for 24 hours before or after surgery.   _X__ 4.  Do Not Smoke or use e-cigarettes For 24 Hours Prior to Your Surgery.                 Do not use any chewable tobacco products for at least 6 hours prior to                 Surgery.  _X__  5.  Do not use any recreational drugs (marijuana, cocaine, heroin, ecstasy, MDMA or other)                For at least one week prior to your surgery.  Combination of these drugs with anesthesia                May have life threatening results.   __X__ 6.  Notify your doctor if there is any change in your medical condition      (cold, fever, infections).     Do not wear jewelry, make-up, hairpins, clips or nail polish. Do not wear lotions, powders, or perfumes. You may wear deodorant. Do not shave 48 hours prior to surgery. Men may shave face and neck. Do not bring valuables to the hospital.    [facility] is not responsible for any belongings or valuables.  Contacts, dentures or bridgework may not be worn into surgery. Leave your suitcase in the car. After surgery it may be brought to  your room. For patients admitted to the hospital, discharge time is determined by your treatment team.   Patients discharged the day of surgery will not be allowed to drive home.   Make arrangements for someone to be with you for the first 24 hours of your Same Day Discharge.   __x__ Take these medicines the morning of surgery with A SIP OF WATER:    1. esomeprazole (NEXIUM) 40 MG   2. citalopram (CELEXA) 20 MG   3. propranolol (INDERAL) 10 MG  4. oxyCODONE-acetaminophen (PERCOCET) 10-325   5.  6.  ____ Fleet Enema (as directed)   __X__ Use CHG Soap as directed  ____ Use Benzoyl Peroxide Gel as instructed  __X__ Use inhalers on the day of surgery  ipratropium-albuterol (DUONEB) 0.5-2.5 (3) MG/3ML SOLN  fluticasone furoate-vilanterol (BREO ELLIPTA) 100-25 MCG/INH AEPB  ____ Stop metformin 2 days prior to surgery    ____ Take 1/2 of usual insulin dose the night before surgery. No  insulin the morning          of surgery.   __X__ Stop aspirin EC 81 MG as instructed by your provider    __X__ Stop Anti-inflammatories such as Ibuprofen, Aleve, Advil, naproxen, and or BC powders.    __X__ Stop supplements until after surgery.  ascorbic acid (VITAMIN C) 500 MG   __X__ Do not start any herbal supplements before your procedure.    If you have any questions regarding your pre-procedure instructions,  Please call Pre-admit Testing at [phone].

## 2020-09-14 ENCOUNTER — Other Ambulatory Visit: Payer: Self-pay

## 2020-09-14 ENCOUNTER — Encounter
Admission: RE | Admit: 2020-09-14 | Discharge: 2020-09-14 | Disposition: A | Discharge status: Home / Self Care | Payer: Medicare HMO | Source: Ambulatory Visit | Attending: Orthopedic Surgery | Admitting: Orthopedic Surgery

## 2020-09-14 DIAGNOSIS — Z01818 Encounter for other preprocedural examination: Secondary | ICD-10-CM | POA: Diagnosis not present

## 2020-09-14 DIAGNOSIS — I10 Essential (primary) hypertension: Secondary | ICD-10-CM | POA: Insufficient documentation

## 2020-09-14 DIAGNOSIS — Z01810 Encounter for preprocedural cardiovascular examination: Secondary | ICD-10-CM | POA: Diagnosis not present

## 2020-09-14 LAB — BASIC METABOLIC PANEL
Anion gap: 9 (ref 5–15)
BUN: 17 mg/dL (ref 8–23)
CO2: 29 mmol/L (ref 22–32)
Calcium: 9.6 mg/dL (ref 8.9–10.3)
Chloride: 105 mmol/L (ref 98–111)
Creatinine, Ser: 0.64 mg/dL (ref 0.44–1.00)
GFR, Estimated: >60 mL/min (ref >60)
Glucose, Bld: 141 mg/dL — ABNORMAL HIGH (ref 70–99)
Potassium: 3.6 mmol/L (ref 3.5–5.1)
Sodium: 143 mmol/L (ref 135–145)

## 2020-09-14 LAB — CBC WITH DIFFERENTIAL/PLATELET
Abs Immature Granulocytes: 0.02 10*3/uL (ref 0.00–0.07)
Basophils Absolute: 0 10*3/uL (ref 0.0–0.1)
Basophils Relative: 1 %
Eosinophils Absolute: 0.2 10*3/uL (ref 0.0–0.5)
Eosinophils Relative: 3 %
HCT: 39.3 % (ref 36.0–46.0)
Hemoglobin: 13 g/dL (ref 12.0–15.0)
Immature Granulocytes: 0 %
Lymphocytes Relative: 20 %
Lymphs Abs: 1 10*3/uL (ref 0.7–4.0)
MCH: 26.3 pg (ref 26.0–34.0)
MCHC: 33.1 g/dL (ref 30.0–36.0)
MCV: 79.4 fL — ABNORMAL LOW (ref 80.0–100.0)
Monocytes Absolute: 0.3 10*3/uL (ref 0.1–1.0)
Monocytes Relative: 6 %
Neutro Abs: 3.7 10*3/uL (ref 1.7–7.7)
Neutrophils Relative %: 70 %
Platelets: 414 10*3/uL — ABNORMAL HIGH (ref 150–400)
RBC: 4.95 MIL/uL (ref 3.87–5.11)
RDW: 13.7 % (ref 11.5–15.5)
WBC: 5.3 10*3/uL (ref 4.0–10.5)
nRBC: 0 % (ref 0.0–0.2)

## 2020-09-14 LAB — APTT: aPTT: 33 seconds (ref 24–36)

## 2020-09-14 LAB — PROTIME-INR
INR: 1 (ref 0.8–1.2)
Prothrombin Time: 12.8 seconds (ref 11.4–15.2)

## 2020-09-15 ENCOUNTER — Other Ambulatory Visit
Admission: RE | Admit: 2020-09-15 | Discharge: 2020-09-15 | Disposition: A | Discharge status: Home / Self Care | Payer: Medicare HMO | Source: Ambulatory Visit | Attending: Orthopedic Surgery | Admitting: Orthopedic Surgery

## 2020-09-15 DIAGNOSIS — Z20822 Contact with and (suspected) exposure to covid-19: Secondary | ICD-10-CM | POA: Insufficient documentation

## 2020-09-15 DIAGNOSIS — Z01812 Encounter for preprocedural laboratory examination: Secondary | ICD-10-CM | POA: Diagnosis not present

## 2020-09-15 LAB — SARS CORONAVIRUS 2 (TAT 6-24 HRS): SARS Coronavirus 2: NEGATIVE

## 2020-09-17 ENCOUNTER — Ambulatory Visit: Payer: Medicare HMO | Admitting: Urgent Care

## 2020-09-17 ENCOUNTER — Encounter
Admission: RE | Disposition: A | Discharge status: Home / Self Care | Payer: Self-pay | Source: Home / Self Care | Attending: Orthopedic Surgery

## 2020-09-17 ENCOUNTER — Ambulatory Visit: Payer: Medicare HMO

## 2020-09-17 ENCOUNTER — Ambulatory Visit
Admission: RE | Admit: 2020-09-17 | Discharge: 2020-09-17 | Disposition: A | Discharge status: Home / Self Care | Payer: Medicare HMO | Attending: Orthopedic Surgery | Admitting: Orthopedic Surgery

## 2020-09-17 ENCOUNTER — Encounter: Payer: Self-pay | Admitting: Orthopedic Surgery

## 2020-09-17 DIAGNOSIS — W19XXXA Unspecified fall, initial encounter: Secondary | ICD-10-CM | POA: Insufficient documentation

## 2020-09-17 DIAGNOSIS — Z7951 Long term (current) use of inhaled steroids: Secondary | ICD-10-CM | POA: Diagnosis not present

## 2020-09-17 DIAGNOSIS — Z5333 Arthroscopic surgical procedure converted to open procedure: Secondary | ICD-10-CM | POA: Diagnosis not present

## 2020-09-17 DIAGNOSIS — E7800 Pure hypercholesterolemia, unspecified: Secondary | ICD-10-CM | POA: Diagnosis not present

## 2020-09-17 DIAGNOSIS — J45909 Unspecified asthma, uncomplicated: Secondary | ICD-10-CM | POA: Diagnosis not present

## 2020-09-17 DIAGNOSIS — M19011 Primary osteoarthritis, right shoulder: Secondary | ICD-10-CM | POA: Insufficient documentation

## 2020-09-17 DIAGNOSIS — M7541 Impingement syndrome of right shoulder: Secondary | ICD-10-CM | POA: Diagnosis not present

## 2020-09-17 DIAGNOSIS — Z888 Allergy status to other drugs, medicaments and biological substances status: Secondary | ICD-10-CM | POA: Diagnosis not present

## 2020-09-17 DIAGNOSIS — S46111A Strain of muscle, fascia and tendon of long head of biceps, right arm, initial encounter: Secondary | ICD-10-CM | POA: Diagnosis not present

## 2020-09-17 DIAGNOSIS — M25511 Pain in right shoulder: Secondary | ICD-10-CM | POA: Diagnosis not present

## 2020-09-17 DIAGNOSIS — S46011A Strain of muscle(s) and tendon(s) of the rotator cuff of right shoulder, initial encounter: Secondary | ICD-10-CM | POA: Insufficient documentation

## 2020-09-17 DIAGNOSIS — M25811 Other specified joint disorders, right shoulder: Secondary | ICD-10-CM | POA: Insufficient documentation

## 2020-09-17 DIAGNOSIS — S43431A Superior glenoid labrum lesion of right shoulder, initial encounter: Secondary | ICD-10-CM | POA: Diagnosis not present

## 2020-09-17 DIAGNOSIS — Z419 Encounter for procedure for purposes other than remedying health state, unspecified: Secondary | ICD-10-CM

## 2020-09-17 DIAGNOSIS — G8918 Other acute postprocedural pain: Secondary | ICD-10-CM | POA: Diagnosis not present

## 2020-09-17 DIAGNOSIS — Z87891 Personal history of nicotine dependence: Secondary | ICD-10-CM | POA: Insufficient documentation

## 2020-09-17 DIAGNOSIS — M75121 Complete rotator cuff tear or rupture of right shoulder, not specified as traumatic: Secondary | ICD-10-CM | POA: Diagnosis not present

## 2020-09-17 DIAGNOSIS — M75101 Unspecified rotator cuff tear or rupture of right shoulder, not specified as traumatic: Secondary | ICD-10-CM | POA: Diagnosis not present

## 2020-09-17 DIAGNOSIS — K219 Gastro-esophageal reflux disease without esophagitis: Secondary | ICD-10-CM | POA: Diagnosis not present

## 2020-09-17 HISTORY — PX: SHOULDER ARTHROSCOPY WITH ROTATOR CUFF REPAIR AND SUBACROMIAL DECOMPRESSION: SHX5686

## 2020-09-17 LAB — GLUCOSE, CAPILLARY
Glucose-Capillary: 118 mg/dL — ABNORMAL HIGH (ref 70–99)
Glucose-Capillary: 129 mg/dL — ABNORMAL HIGH (ref 70–99)

## 2020-09-17 SURGERY — SHOULDER ARTHROSCOPY WITH ROTATOR CUFF REPAIR AND SUBACROMIAL DECOMPRESSION
Anesthesia: General | Laterality: Right

## 2020-09-17 MED ORDER — FENTANYL CITRATE (PF) 100 MCG/2ML IJ SOLN
50.0000 ug | Freq: Once | INTRAMUSCULAR | Status: AC
  Administered 2020-09-17: 50 ug via INTRAVENOUS

## 2020-09-17 MED ORDER — BUPIVACAINE HCL (PF) 0.5 % IJ SOLN
INTRAMUSCULAR | Status: DC | PRN
  Administered 2020-09-17: 10 mL via PERINEURAL

## 2020-09-17 MED ORDER — NEOMYCIN-POLYMYXIN B GU 40-200000 IR SOLN
Status: AC
  Filled 2020-09-17: qty 2

## 2020-09-17 MED ORDER — LIDOCAINE HCL (CARDIAC) PF 100 MG/5ML IV SOSY
PREFILLED_SYRINGE | INTRAVENOUS | Status: DC | PRN
  Administered 2020-09-17: 100 mg via INTRAVENOUS

## 2020-09-17 MED ORDER — PROPOFOL 10 MG/ML IV BOLUS
INTRAVENOUS | Status: DC | PRN
  Administered 2020-09-17: 130 mg via INTRAVENOUS

## 2020-09-17 MED ORDER — FENTANYL CITRATE (PF) 100 MCG/2ML IJ SOLN
INTRAMUSCULAR | Status: DC | PRN
  Administered 2020-09-17: 50 ug via INTRAVENOUS
  Administered 2020-09-17 (×2): 25 ug via INTRAVENOUS

## 2020-09-17 MED ORDER — CHLORHEXIDINE GLUCONATE CLOTH 2 % EX PADS: 6.0000 | MEDICATED_PAD | Freq: Once | CUTANEOUS | Status: DC

## 2020-09-17 MED ORDER — PROPOFOL 10 MG/ML IV BOLUS
INTRAVENOUS | Status: AC
  Filled 2020-09-17: qty 20

## 2020-09-17 MED ORDER — CEFAZOLIN SODIUM-DEXTROSE 2-4 GM/100ML-% IV SOLN
INTRAVENOUS | Status: AC
  Filled 2020-09-17: qty 100

## 2020-09-17 MED ORDER — ONDANSETRON HCL 4 MG/2ML IJ SOLN
INTRAMUSCULAR | Status: DC | PRN
  Administered 2020-09-17: 4 mg via INTRAVENOUS

## 2020-09-17 MED ORDER — PHENYLEPHRINE HCL (PRESSORS) 10 MG/ML IV SOLN
INTRAVENOUS | Status: AC
  Filled 2020-09-17: qty 1

## 2020-09-17 MED ORDER — ROCURONIUM BROMIDE 100 MG/10ML IV SOLN
INTRAVENOUS | Status: DC | PRN
  Administered 2020-09-17: 10 mg via INTRAVENOUS
  Administered 2020-09-17: 50 mg via INTRAVENOUS

## 2020-09-17 MED ORDER — BUPIVACAINE HCL (PF) 0.25 % IJ SOLN
INTRAMUSCULAR | Status: AC
  Filled 2020-09-17: qty 30

## 2020-09-17 MED ORDER — DEXAMETHASONE SODIUM PHOSPHATE 10 MG/ML IJ SOLN
INTRAMUSCULAR | Status: DC | PRN
  Administered 2020-09-17: 10 mg via INTRAVENOUS

## 2020-09-17 MED ORDER — EPINEPHRINE PF 1 MG/ML IJ SOLN
INTRAMUSCULAR | Status: AC
  Filled 2020-09-17: qty 1

## 2020-09-17 MED ORDER — BUPIVACAINE LIPOSOME 1.3 % IJ SUSP
INTRAMUSCULAR | Status: AC
  Filled 2020-09-17: qty 20

## 2020-09-17 MED ORDER — ORAL CARE MOUTH RINSE: 15.0000 mL | Freq: Once | OROMUCOSAL | Status: AC

## 2020-09-17 MED ORDER — FENTANYL CITRATE (PF) 100 MCG/2ML IJ SOLN: 50.0000 ug | Freq: Once | INTRAMUSCULAR | Status: AC

## 2020-09-17 MED ORDER — EPINEPHRINE PF 1 MG/ML IJ SOLN
INTRAMUSCULAR | Status: DC | PRN
  Administered 2020-09-17: 4 mg

## 2020-09-17 MED ORDER — CHLORHEXIDINE GLUCONATE 0.12 % MT SOLN: 15.0000 mL | Freq: Once | OROMUCOSAL | Status: AC

## 2020-09-17 MED ORDER — LIDOCAINE HCL (PF) 1 % IJ SOLN
INTRAMUSCULAR | Status: DC | PRN
  Administered 2020-09-17: 5 mL via SUBCUTANEOUS

## 2020-09-17 MED ORDER — LIDOCAINE HCL (PF) 1 % IJ SOLN
INTRAMUSCULAR | Status: AC
  Filled 2020-09-17: qty 30

## 2020-09-17 MED ORDER — CHLORHEXIDINE GLUCONATE 0.12 % MT SOLN
OROMUCOSAL | Status: AC
  Administered 2020-09-17: 15 mL via OROMUCOSAL
  Filled 2020-09-17: qty 15

## 2020-09-17 MED ORDER — BUPIVACAINE LIPOSOME 1.3 % IJ SUSP
INTRAMUSCULAR | Status: DC | PRN
  Administered 2020-09-17: 20 mL via PERINEURAL

## 2020-09-17 MED ORDER — SUGAMMADEX SODIUM 200 MG/2ML IV SOLN
INTRAVENOUS | Status: DC | PRN
  Administered 2020-09-17: 200 mg via INTRAVENOUS

## 2020-09-17 MED ORDER — FENTANYL CITRATE (PF) 100 MCG/2ML IJ SOLN
INTRAMUSCULAR | Status: AC
  Administered 2020-09-17: 50 ug via INTRAVENOUS
  Filled 2020-09-17: qty 2

## 2020-09-17 MED ORDER — PHENYLEPHRINE HCL (PRESSORS) 10 MG/ML IV SOLN
INTRAVENOUS | Status: DC | PRN
  Administered 2020-09-17: 50 ug via INTRAVENOUS
  Administered 2020-09-17: 100 ug via INTRAVENOUS
  Administered 2020-09-17: 150 ug via INTRAVENOUS
  Administered 2020-09-17: 50 ug via INTRAVENOUS
  Administered 2020-09-17: 100 ug via INTRAVENOUS
  Administered 2020-09-17: 150 ug via INTRAVENOUS
  Administered 2020-09-17: 100 ug via INTRAVENOUS

## 2020-09-17 MED ORDER — CEFAZOLIN SODIUM-DEXTROSE 2-4 GM/100ML-% IV SOLN
2.0000 g | INTRAVENOUS | Status: AC
  Administered 2020-09-17: 2 g via INTRAVENOUS

## 2020-09-17 MED ORDER — SODIUM CHLORIDE 0.9 % IV SOLN
INTRAVENOUS | Status: DC | PRN
  Administered 2020-09-17: 10 ug/min via INTRAVENOUS

## 2020-09-17 MED ORDER — SODIUM CHLORIDE 0.9 % IV SOLN: INTRAVENOUS | Status: DC

## 2020-09-17 MED ORDER — ONDANSETRON HCL 4 MG PO TABS: 4.0000 mg | ORAL_TABLET | Freq: Three times a day (TID) | ORAL | 0 refills | Status: DC | PRN

## 2020-09-17 MED ORDER — NEOMYCIN-POLYMYXIN B GU 40-200000 IR SOLN
Status: DC | PRN
  Administered 2020-09-17: 2 mL

## 2020-09-17 MED ORDER — EPINEPHRINE PF 1 MG/ML IJ SOLN
INTRAMUSCULAR | Status: AC
  Filled 2020-09-17: qty 3

## 2020-09-17 MED ORDER — FENTANYL CITRATE (PF) 100 MCG/2ML IJ SOLN
INTRAMUSCULAR | Status: AC
  Filled 2020-09-17: qty 2

## 2020-09-17 MED ORDER — BUPIVACAINE HCL (PF) 0.5 % IJ SOLN
INTRAMUSCULAR | Status: AC
  Filled 2020-09-17: qty 10

## 2020-09-17 MED ORDER — LIDOCAINE HCL (PF) 1 % IJ SOLN
INTRAMUSCULAR | Status: AC
  Filled 2020-09-17: qty 5

## 2020-09-17 SURGICAL SUPPLY — 77 items
ADAPTER IRRIG TUBE 2 SPIKE SOL (ADAPTER) ×4 IMPLANT
ADPR TBG 2 SPK PMP STRL ASCP (ADAPTER) ×2
ANCH SUT 5.5 KNTLS PEEK (Orthopedic Implant) ×3 IMPLANT
ANCH SUT Q-FX 2.8 (Anchor) ×1 IMPLANT
ANCHOR ALL-SUT Q-FIX 2.8 (Anchor) ×9 IMPLANT
ANCHOR SUT 5.5 MULTIFIX (Orthopedic Implant) ×3 IMPLANT
ANCHOR SUT BIOC ST 3X145 (Anchor) IMPLANT
ANCHOR SUTURE 5.5MM MULTIFIX (Orthopedic Implant) IMPLANT
BUR RADIUS 4.0X18.5 (BURR) ×3 IMPLANT
BUR RADIUS 5.5 (BURR) ×2 IMPLANT
CANISTER SUCT LVC 12 LTR MEDI- (MISCELLANEOUS) ×2 IMPLANT
CANNULA 5.75X7 CRYSTAL CLEAR (CANNULA) ×4 IMPLANT
CANNULA PARTIAL THREAD 2X7 (CANNULA) ×1 IMPLANT
CANNULA TWIST IN 8.25X9CM (CANNULA) ×2 IMPLANT
CONNECTOR PERFECT PASSER (CONNECTOR) ×3 IMPLANT
COOLER POLAR GLACIER W/PUMP (MISCELLANEOUS) ×2 IMPLANT
COVER WAND RF STERILE (DRAPES) ×2 IMPLANT
DEVICE SUCT BLK HOLE OR FLOOR (MISCELLANEOUS) ×2 IMPLANT
DRAPE 3/4 80X56 (DRAPES) ×2 IMPLANT
DRAPE IMP U-DRAPE 54X76 (DRAPES) ×4 IMPLANT
DRAPE INCISE IOBAN 66X45 STRL (DRAPES) ×2 IMPLANT
DRAPE U-SHAPE 47X51 STRL (DRAPES) ×2 IMPLANT
DRSG OPSITE POSTOP 4X6 (GAUZE/BANDAGES/DRESSINGS) ×1 IMPLANT
DRSG OPSITE POSTOP 4X8 (GAUZE/BANDAGES/DRESSINGS) ×1 IMPLANT
DURAPREP 26ML APPLICATOR (WOUND CARE) ×7 IMPLANT
ELECT REM PT RETURN 9FT ADLT (ELECTROSURGICAL) ×2
ELECTRODE REM PT RTRN 9FT ADLT (ELECTROSURGICAL) ×1 IMPLANT
GAUZE SPONGE 4X4 12PLY STRL (GAUZE/BANDAGES/DRESSINGS) ×2 IMPLANT
GAUZE XEROFORM 1X8 LF (GAUZE/BANDAGES/DRESSINGS) ×2 IMPLANT
GLOVE BIOGEL PI IND STRL 9 (GLOVE) ×1 IMPLANT
GLOVE BIOGEL PI INDICATOR 9 (GLOVE) ×1
GLOVE SURG 9.0 ORTHO LTXF (GLOVE) ×6 IMPLANT
GOWN STRL REUS TWL 2XL XL LVL4 (GOWN DISPOSABLE) ×2 IMPLANT
GOWN STRL REUS W/ TWL LRG LVL3 (GOWN DISPOSABLE) ×1 IMPLANT
GOWN STRL REUS W/TWL LRG LVL3 (GOWN DISPOSABLE) ×2
IV LACTATED RINGER IRRG 3000ML (IV SOLUTION) ×16
IV LR IRRIG 3000ML ARTHROMATIC (IV SOLUTION) ×8 IMPLANT
KIT STABILIZATION SHOULDER (MISCELLANEOUS) ×2 IMPLANT
KIT SUTURE 2.8 Q-FIX DISP (MISCELLANEOUS) ×2 IMPLANT
KIT SUTURETAK 3.0 INSERT PERC (KITS) IMPLANT
KIT TURNOVER KIT A (KITS) ×2 IMPLANT
MANIFOLD NEPTUNE II (INSTRUMENTS) ×4 IMPLANT
MASK FACE SPIDER DISP (MASK) ×2 IMPLANT
MAT ABSORB  FLUID 56X50 GRAY (MISCELLANEOUS) ×4
MAT ABSORB FLUID 56X50 GRAY (MISCELLANEOUS) ×3 IMPLANT
NDL SAFETY ECLIPSE 18X1.5 (NEEDLE) ×1 IMPLANT
NEEDLE HYPO 18GX1.5 SHARP (NEEDLE) ×2
NEEDLE HYPO 22GX1.5 SAFETY (NEEDLE) ×2 IMPLANT
NS IRRIG 500ML POUR BTL (IV SOLUTION) ×2 IMPLANT
PACK ARTHROSCOPY SHOULDER (MISCELLANEOUS) ×2 IMPLANT
PAD ARMBOARD 7.5X6 YLW CONV (MISCELLANEOUS) ×4 IMPLANT
PAD WRAPON POLAR SHDR XLG (MISCELLANEOUS) ×1 IMPLANT
PASSER SUT CAPTURE FIRST (INSTRUMENTS) ×2 IMPLANT
PASSER SUT FIRSTPASS SELF (INSTRUMENTS) ×1 IMPLANT
SET TUBE SUCT SHAVER OUTFL 24K (TUBING) ×2 IMPLANT
SET TUBE TIP INTRA-ARTICULAR (MISCELLANEOUS) ×2 IMPLANT
STRIP CLOSURE SKIN 1/2X4 (GAUZE/BANDAGES/DRESSINGS) ×2 IMPLANT
SUT ETHILON 4-0 (SUTURE) ×2
SUT ETHILON 4-0 FS2 18XMFL BLK (SUTURE) ×1
SUT LASSO 90 DEG SD STR (SUTURE) IMPLANT
SUT MNCRL 4-0 (SUTURE) ×2
SUT MNCRL 4-0 27XMFL (SUTURE) ×1
SUT PDS AB 0 CT1 27 (SUTURE) ×6 IMPLANT
SUT PERFECTPASSER WHITE CART (SUTURE) ×7 IMPLANT
SUT SMART STITCH CARTRIDGE (SUTURE) ×9 IMPLANT
SUT ULTRABRAID 2 COBRAID 38 (SUTURE) IMPLANT
SUT VIC AB 0 CT1 36 (SUTURE) ×6 IMPLANT
SUT VIC AB 2-0 CT2 27 (SUTURE) ×2 IMPLANT
SUT VICRYL 0 AB UR-6 (SUTURE) ×1 IMPLANT
SUTURE ETHLN 4-0 FS2 18XMF BLK (SUTURE) ×1 IMPLANT
SUTURE MNCRL 4-0 27XMF (SUTURE) ×1 IMPLANT
SYR 10ML LL (SYRINGE) ×2 IMPLANT
TAPE MICROFOAM 4IN (TAPE) ×2 IMPLANT
TUBING ARTHRO INFLOW-ONLY STRL (TUBING) ×2 IMPLANT
TUBING CONNECTING 10 (TUBING) ×2 IMPLANT
WAND HAND CNTRL MULTIVAC 90 (MISCELLANEOUS) ×2 IMPLANT
WRAPON POLAR PAD SHDR XLG (MISCELLANEOUS) ×2

## 2020-09-17 NOTE — Anesthesia Procedure Notes (Signed)
Procedure Name: Intubation Performed by: Reece Agar, CRNA Pre-anesthesia Checklist: Patient identified, Emergency Drugs available, Suction available and Patient being monitored Patient Re-evaluated:Patient Re-evaluated prior to induction Oxygen Delivery Method: Circle system utilized Preoxygenation: Pre-oxygenation with 100% oxygen Induction Type: IV induction Ventilation: Mask ventilation without difficulty Tube type: Oral Number of attempts: 1 Airway Equipment and Method: Stylet and Oral airway Placement Confirmation: ETT inserted through vocal cords under direct vision,  positive ETCO2 and breath sounds checked- equal and bilateral Tube secured with: Tape Dental Injury: Teeth and Oropharynx as per pre-operative assessment

## 2020-09-17 NOTE — Anesthesia Preprocedure Evaluation (Addendum)
Anesthesia Evaluation  Patient identified by MRN, date of birth, ID band Patient awake    Reviewed: Allergy & Precautions, H&P , NPO status , Patient's Chart, lab work & pertinent test results, reviewed documented beta blocker date and time   History of Anesthesia Complications Negative for: history of anesthetic complications  Airway Mallampati: I  TM Distance: >3 FB Neck ROM: full    Dental  (+) Edentulous Upper, Edentulous Lower, Upper Dentures, Lower Dentures, Dental Advidsory Given   Pulmonary neg shortness of breath, asthma , neg recent URI, former smoker,    Pulmonary exam normal breath sounds clear to auscultation       Cardiovascular Exercise Tolerance: Good hypertension, (-) angina(-) Past MI and (-) Cardiac Stents Normal cardiovascular exam(-) dysrhythmias (-) Valvular Problems/Murmurs Rhythm:regular Rate:Normal     Neuro/Psych  Headaches, neg Seizures PSYCHIATRIC DISORDERS Anxiety  Neuromuscular disease    GI/Hepatic Neg liver ROS, GERD  ,  Endo/Other  diabetes, Well Controlled, Type 2  Renal/GU negative Renal ROS  negative genitourinary   Musculoskeletal   Abdominal   Peds  Hematology negative hematology ROS (+)   Anesthesia Other Findings Past Medical History: No date: Anxiety No date: Arthritis No date: Asthma 03/02/2017: 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HCC) No date: Cataract     Comment:  right eye but immature No date: Essential hypertension, benign     Comment:  takes Lisinopril-HCTZ daily No date: GERD (gastroesophageal reflux disease) No date: Headache     Comment:  sinus No date: History of colon polyps     Comment:  benign No date: History of migraine No date: History of shingles No date: Hyperlipidemia     Comment:  takes Lipitor daily No date: Low back pain No date: Nocturia No date: S/P insertion of spinal cord stimulator No date: Seasonal allergies     Comment:  takes Singulair daily as  needed No date: Type II or unspecified type diabetes mellitus without  mention of complication, not stated as uncontrolled No date: Vitamin D deficiency     Comment:  takes Vit D weekly No date: Weakness     Comment:  numbness and tingling left arm No date: Wears dentures     Comment:  full upper and lower   Reproductive/Obstetrics negative OB ROS                             Anesthesia Physical Anesthesia Plan  ASA: III  Anesthesia Plan: General   Post-op Pain Management:  Regional for Post-op pain   Induction: Intravenous  PONV Risk Score and Plan: 3 and Ondansetron, Dexamethasone and Treatment may vary due to age or medical condition  Airway Management Planned: Oral ETT  Additional Equipment:   Intra-op Plan:   Post-operative Plan: Extubation in OR  Informed Consent: I have reviewed the patients History and Physical, chart, labs and discussed the procedure including the risks, benefits and alternatives for the proposed anesthesia with the patient or authorized representative who has indicated his/her understanding and acceptance.     Dental Advisory Given  Plan Discussed with: Anesthesiologist, CRNA and Surgeon  Anesthesia Plan Comments:        Anesthesia Quick Evaluation

## 2020-09-17 NOTE — Anesthesia Procedure Notes (Signed)
Anesthesia Regional Block: Interscalene brachial plexus block   Pre-Anesthetic Checklist: ,, timeout performed, Correct Patient, Correct Site, Correct Laterality, Correct Procedure, Correct Position, site marked, Risks and benefits discussed,  Surgical consent,  Pre-op evaluation,  At surgeon's request and post-op pain management  Laterality: Right and Upper  Prep: chloraprep       Needles:  Injection technique: Single-shot  Needle Type: Stimiplex     Needle Length: 5cm  Needle Gauge: 22     Additional Needles:   Procedures:,,,, ultrasound used (permanent image in chart),,,,  Narrative:  Start time: 09/17/2020 8:53 AM End time: 09/17/2020 8:59 AM Injection made incrementally with aspirations every 5 mL.  Performed by: Personally  Anesthesiologist: Lenard Simmer, MD  Additional Notes: Functioning IV was confirmed and monitors were applied.  A 70mm 22ga Stimuplex needle was used. Sterile prep and drape,hand hygiene and sterile gloves were used.  Negative aspiration and negative test dose prior to incremental administration of local anesthetic. The patient tolerated the procedure well.

## 2020-09-17 NOTE — Anesthesia Postprocedure Evaluation (Signed)
Anesthesia Post Note  Patient: [name]  Procedure(s) Performed: RIGHT SHOULDER ARTHROSCOPY WITH MINI-OPEN ROTATOR CUFF REPAIR, DISTAL CLAVICLE EXCISION, AND SUBACROMIAL DECOMPRESSION WITH BICEP TENDONESIS (Right )  Patient location during evaluation: PACU Anesthesia Type: General Level of consciousness: awake and alert Pain management: pain level controlled Vital Signs Assessment: post-procedure vital signs reviewed and stable Respiratory status: spontaneous breathing, nonlabored ventilation, respiratory function stable and patient connected to nasal cannula oxygen Cardiovascular status: blood pressure returned to baseline and stable Postop Assessment: no apparent nausea or vomiting Anesthetic complications: no   No complications documented.   Last Vitals:  Vitals:   09/17/20 1333 09/17/20 1410  BP: (!) 176/73 (!) 168/69  Pulse: 77 74  Resp: 16 16  Temp: (!) 36.1 C (!) 36.3 C  SpO2: 95% 94%    Last Pain:  Vitals:   09/17/20 1410  TempSrc: Temporal  PainSc: 0-No pain                 Lenard Simmer

## 2020-09-17 NOTE — Transfer of Care (Signed)
Immediate Anesthesia Transfer of Care Note  Patient: Lori Potts  Procedure(s) Performed: RIGHT SHOULDER ARTHROSCOPY WITH MINI-OPEN ROTATOR CUFF REPAIR, DISTAL CLAVICLE EXCISION, AND SUBACROMIAL DECOMPRESSION WITH BICEP TENDONESIS (Right )  Patient Location: PACU  Anesthesia Type:General, Regional and GA combined with regional for post-op pain  Level of Consciousness: drowsy and patient cooperative  Airway & Oxygen Therapy: Patient Spontanous Breathing and Patient connected to face mask oxygen  Post-op Assessment: Report given to RN and Post -op Vital signs reviewed and stable  Post vital signs: Reviewed and stable  Last Vitals:  Vitals Value Taken Time  BP    Temp    Pulse 65 09/17/20 1237  Resp 12 09/17/20 1237  SpO2 100 % 09/17/20 1237    Last Pain:  Vitals:   09/17/20 0735  TempSrc: Tympanic  PainSc: 8          Complications: No complications documented.

## 2020-09-17 NOTE — H&P (Signed)
PREOPERATIVE H&P  Chief Complaint: Right Rotator Cuff Tear  HPI: Lori Potts is a 75 y.o. female who presents for preoperative history and physical with a diagnosis of Right Rotator Cuff Tear. Symptoms of pain, limited ROM and weakness are significantly impairing activities of daily living.  Patient sustained the injury from a fall in November.  A CT arthrogram confirmed a right shoulder rotator cuff tear involving the supraspinatus.  Patient is failed nonoperative management. She wished to proceed with surgical fixation of her right rotator cuff tear.   Past Medical History:  Diagnosis Date  . Anxiety   . Arthritis   . Asthma   . 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 Cataract    right eye but immature  . Essential hypertension, benign    takes Lisinopril-HCTZ daily  . GERD (gastroesophageal reflux disease)   . Headache    sinus  . History of colon polyps    benign  . History of migraine   . History of shingles   . Hyperlipidemia    takes Lipitor daily  . Low back pain   . Nocturia   . S/P insertion of spinal cord stimulator   . Seasonal allergies    takes Singulair daily as needed  . Type II or unspecified type diabetes mellitus without mention of complication, not stated as uncontrolled   . Vitamin D deficiency    takes Vit D weekly  . Weakness    numbness and tingling left arm  . Wears dentures    full upper and lower   Past Surgical History:  Procedure Laterality Date  . ABDOMINAL HYSTERECTOMY    . ANTERIOR CERVICAL DECOMP/DISCECTOMY FUSION N/A 02/26/2015   Procedure: ANTERIOR CERVICAL DISCECTOMY FUSION C4-5 (1 LEVEL);  Surgeon: Venita Lick, MD;  Location[facility] OR;  Service: Orthopedics;  Laterality: N/A;  . ANTERIOR LAT LUMBAR FUSION N/A 03/21/2013   Procedure: ANTERIOR LATERAL LUMBAR FUSION 1 LEVEL/ XLIF L3-L4 ;  Surgeon: Venita Lick, MD;  Location: [facility] OR;  Service: Orthopedics;  Laterality: N/A;  . APPENDECTOMY    . AUGMENTATION MAMMAPLASTY Bilateral 1978   . BACK SURGERY    . BACK SURGERY     Lumbar fusion x 2  . BREAST EXCISIONAL BIOPSY Right 1970  . BREAST SURGERY  1990   Augementation  . CARDIAC CATHETERIZATION  5/12   ef 55%  . CARDIAC CATHETERIZATION  10/2010   [facility]; EF 55%  . CARDIAC CATHETERIZATION Left 02/16/2016   Procedure: Left Heart Cath and Coronary Angiography;  Surgeon: Alwyn Pea, MD;  Location: [facility] INVASIVE CV LAB;  Service: Cardiovascular;  Laterality: Left;  . COLONOSCOPY    . COLONOSCOPY WITH PROPOFOL N/A 04/10/2017   Procedure: COLONOSCOPY WITH PROPOFOL;  Surgeon: Midge Minium, MD;  Location: [facility];  Service: Gastroenterology;  Laterality: N/A;  Diabetic - insulin  . ESOPHAGOGASTRODUODENOSCOPY    . JOINT REPLACEMENT    . KIDNEY SURGERY  1998   growth removed from left kidney   . LEFT HEART CATH AND CORONARY ANGIOGRAPHY Left 09/10/2018   Procedure: LEFT HEART CATH AND CORONARY ANGIOGRAPHY;  Surgeon: Alwyn Pea, MD;  Location: [facility] INVASIVE CV LAB;  Service: Cardiovascular;  Laterality: Left;  . pain stimulator    . POSTERIOR CERVICAL FUSION/FORAMINOTOMY Right 03/21/2013   Procedure: POSTERIOR L2-3 RIGHT FORAMINOTOMY;  Surgeon: Venita Lick, MD;  Location: [facility] OR;  Service: Orthopedics;  Laterality: Right;  . REVISION OF SCAR TISSUE RECTUS MUSCLE    . SMALL BOWEL REPAIR    .  SPINAL CORD STIMULATOR BATTERY EXCHANGE N/A 10/17/2012   Procedure: SPINAL CORD STIMULATOR BATTERY REMOVAL;  Surgeon: Venita Lick, MD;  Location: [facility] OR;  Service: Orthopedics;  Laterality: N/A;  . SPINAL CORD STIMULATOR BATTERY EXCHANGE N/A 07/22/2015   Procedure: REIMPLANTATION OF SPINAL CORD STIMULATOR BATTERY ;  Surgeon: Venita Lick, MD;  Location: [facility] OR;  Service: Orthopedics;  Laterality: N/A;  . TOTAL KNEE ARTHROPLASTY Left   . TOTAL KNEE ARTHROPLASTY Right 07/28/2017   Procedure: RIGHT TOTAL KNEE ARTHROPLASTY;  Surgeon: Eugenia Mcalpine, MD;  Location: [facility] ORS;  Service: Orthopedics;  Laterality: Right;   Social  History   Socioeconomic History  . Marital status: Married    Spouse name: Chrissie Noa  . Number of children: 3  . Years of education: Not on file  . Highest education level: Associate degree: occupational, Scientist, product/process development, or vocational program  Occupational History  . Not on file  Tobacco Use  . Smoking status: Former Smoker    Types: Cigarettes    Quit date: 2006    Years since quitting: 16.0  . Smokeless tobacco: Never Used  . Tobacco comment: quit smoking 10yrs ago  Vaping Use  . Vaping Use: Never used  Substance and Sexual Activity  . Alcohol use: No  . Drug use: No  . Sexual activity: Yes    Partners: Male    Birth control/protection: Surgical  Other Topics Concern  . Not on file  Social History Narrative  . Not on file   Social Determinants of Health   Financial Resource Strain: High Risk  . Difficulty of Paying Living Expenses: Very hard  Food Insecurity: No Food Insecurity  . Worried About Programme researcher, broadcasting/film/video in the Last Year: Never true  . Ran Out of Food in the Last Year: Never true  Transportation Needs: No Transportation Needs  . Lack of Transportation (Medical): No  . Lack of Transportation (Non-Medical): No  Physical Activity: Inactive  . Days of Exercise per Week: 0 days  . Minutes of Exercise per Session: 0 min  Stress: No Stress Concern Present  . Feeling of Stress : Not at all  Social Connections: Socially Integrated  . Frequency of Communication with Friends and Family: More than three times a week  . Frequency of Social Gatherings with Friends and Family: Twice a week  . Attends Religious Services: More than 4 times per year  . Active Member of Clubs or Organizations: Yes  . Attends Banker Meetings: 1 to 4 times per year  . Marital Status: Married   Family History  Problem Relation Age of Onset  . Heart attack Mother   . Sarcoidosis Sister    Allergies  Allergen Reactions  . Metformin And Related Diarrhea  . Adhesive [Tape] Rash  and Other (See Comments)    Regular tape is ok, allergy is to paper tape   Prior to Admission medications   Medication Sig Start Date End Date Taking? Authorizing Provider  albuterol (VENTOLIN HFA) 108 (90 Base) MCG/ACT inhaler Inhale 1 puff into the lungs every 6 (six) hours as needed for wheezing or shortness of breath. 06/12/19  Yes Doren Custard, [name]  ascorbic acid (VITAMIN C) 500 MG tablet Take 500 mg by mouth daily.   Yes [provider]  citalopram (CELEXA) 20 MG tablet Take 1 tablet (20 mg total) by mouth daily. For nerves, stress, anxiety symptoms 02/18/20  Yes Danelle Berry, PA-C  Dulaglutide (TRULICITY) 0.75 MG/0.5ML SOPN Inject 0.75 mg into the skin once  a week. 08/20/20  Yes Danelle Berry, PA-C  esomeprazole (NEXIUM) 40 MG capsule Take 1 capsule (40 mg total) by mouth daily at 12 noon. Patient taking differently: Take 40 mg by mouth daily as needed (heart burn). 07/21/20  Yes Danelle Berry, PA-C  fluticasone furoate-vilanterol (BREO ELLIPTA) 100-25 MCG/INH AEPB Inhale 1 puff into the lungs daily.   Yes [provider]  ipratropium-albuterol (DUONEB) 0.5-2.5 (3) MG/3ML SOLN CAN TAKE EVERY 20 MINUTES FOR 2 DOSES AS NEEDED FOR WHEEZING Patient taking differently: Inhale 3 mLs into the lungs every 6 (six) hours as needed (SOB). 07/01/19  Yes Danelle Berry, PA-C  levocetirizine (XYZAL) 5 MG tablet Take 1 tablet (5 mg total) by mouth every evening. Patient taking differently: Take 5 mg by mouth daily as needed for allergies. 07/19/19  Yes Doren Custard, [name]  losartan (COZAAR) 25 MG tablet TAKE 1 TABLET EVERY DAY Patient taking differently: Take 25 mg by mouth daily. 01/13/20  Yes Danelle Berry, PA-C  methocarbamol (ROBAXIN) 500 MG tablet Take 500 mg by mouth every 8 (eight) hours as needed for muscle spasms.   Yes [provider]  Multiple Vitamins-Minerals (MULTIVITAMIN WITH MINERALS) tablet Take 1 tablet by mouth daily.   Yes [provider]   oxyCODONE-acetaminophen (PERCOCET) 10-325 MG tablet Take 1 tablet by mouth 5 (five) times daily.  03/06/18  Yes Su Hoff, PA-C  propranolol (INDERAL) 10 MG tablet TAKE 1 TABLET BY MOUTH THREE TIMES A DAY Patient taking differently: Take 10 mg by mouth 2 (two) times daily. 02/13/20  Yes Danelle Berry, PA-C  rosuvastatin (CRESTOR) 40 MG tablet TAKE 1 TABLET BY MOUTH EVERY DAY Patient taking differently: Take 40 mg by mouth daily. 03/25/20  Yes Danelle Berry, PA-C  sodium chloride (OCEAN) 0.65 % SOLN nasal spray Place 1 spray into both nostrils 2 (two) times daily as needed for congestion. 11/19/18  Yes Poulose, Percell Belt, NP  SUMAtriptan (IMITREX) 50 MG tablet TAKE 1-2 TABLETS BY MOUTH AS NEEDED FOR MIGRAINE MAY REPEAT IN 2 HOURS IF NEEDED. MAX 4/24 HOURS Patient taking differently: Take 50-100 mg by mouth every 2 (two) hours as needed for migraine. 07/21/20  Yes Danelle Berry, PA-C  Alcohol Swabs (B-D SINGLE USE SWABS REGULAR) PADS USE 2 (TWO) TIMES DAILY. 03/26/20   Danelle Berry, PA-C  aspirin EC 81 MG tablet Take 81 mg by mouth daily. Patient not taking: No sig reported    [provider]  benzonatate (TESSALON) 100 MG capsule Take 1 capsule (100 mg total) by mouth 3 (three) times daily as needed for cough. Patient not taking: No sig reported 03/25/20   Danelle Berry, PA-C  promethazine (PHENERGAN) 25 MG tablet Take 1 tablet (25 mg total) by mouth every 8 (eight) hours as needed for nausea or vomiting. Patient not taking: No sig reported 02/18/20   Danelle Berry, PA-C     Positive ROS: All other systems have been reviewed and were otherwise negative with the exception of those mentioned in the HPI and as above.  Physical Exam: General: Alert, no acute distress Cardiovascular: Regular rate and rhythm, no murmurs rubs or gallops.  No pedal edema Respiratory: Clear to auscultation bilaterally, no wheezes rales or rhonchi. No cyanosis, no use of accessory musculature GI: No organomegaly,  abdomen is soft and non-tender nondistended with positive bowel sounds. Skin: Skin intact, no lesions within the operative field. Neurologic: Sensation intact distally Psychiatric: Patient is competent for consent with normal mood and affect Lymphatic: No cervical lymphadenopathy  MUSCULOSKELETAL: Right shoulder: Patient has intact skin.  There is no erythema ecchymosis or swelling.  There is no significant muscle atrophy.  Patient has pain with forward elevation and abduction to 90 degrees.  Patient demonstrates weakness to shoulder abduction.  Patient has positive impingement signs but no apprehension instability.  She has full digital wrist and elbow range of motion, intact/light touch and palpable radial pulse.  Assessment: Right Rotator Cuff Tear  Plan: Plan for Procedure(s): RIGHT SHOULDER ARTHROSCOPY WITH MINI-OPEN ROTATOR CUFF REPAIR, DISTAL CLAVICLE EXCISION, AND SUBACROMIAL DECOMPRESSION  I reviewed the details of the operation as well as the postoperative course with the patient.  I have reviewed the patient's CT arthrogram and labs in preparation for this case.  I discussed the risks and benefits of surgery. The risks include but are not limited to infection, bleeding, nerve or blood vessel injury, joint stiffness or loss of motion, persistent pain, weakness or instability, retear of the rotator cuff and  failure of the repair and the need for further surgery. Medical risks include but are not limited to DVT and pulmonary embolism, myocardial infarction, stroke, pneumonia, respiratory failure and death. Patient understood these risks and wished to proceed.     Juanell Fairly, MD   09/17/2020 8:41 AM

## 2020-09-17 NOTE — Discharge Instructions (Signed)

## 2020-09-17 NOTE — Op Note (Signed)
09/17/2020  12:40 PM  PATIENT:  Lori Potts  75 y.o. female  PRE-OPERATIVE DIAGNOSIS:  Right shoulder rotator Cuff Tear, impingement with AC joint arthrosis  POST-OPERATIVE DIAGNOSIS:  Right shoulder full-thickness rotator Cuff Tear, type II SLAP tear, glenohumeral joint arthrosis, AC joint arthrosis and subacromial impingement  PROCEDURE:  Procedure(s): RIGHT SHOULDER ARTHROSCOPIC CHONDROPLASTY OF THE HUMERAL HEAD, DISTAL CLAVICLE EXCISION, SUBACROMIAL DECOMPRESSION WITH MINI-OPEN ROTATOR CUFF REPAIR AND MINI-OPEN BICEP TENDONESIS   SURGEON:  Surgeon(s) and Role:    Thornton Park, MD - Primary  ANESTHESIA:   general and paracervical block  PREOPERATIVE INDICATIONS:  Lori Potts is a  75 y.o. female with a diagnosis of Right Rotator Cuff Tear confirmed by CT arthrogram, who failed conservative measures and elected for surgical management.    The risks benefits and alternatives were discussed with the patient preoperatively including but not limited to the risks of infection, bleeding, nerve injury, persistent pain or weakness, failure of the hardware, re-tear of the rotator cuff and the need for further surgery. Medical risks include DVT and pulmonary embolism, myocardial infarction, stroke, pneumonia, respiratory failure and death. Patient understood these risks and wished to proceed.  OPERATIVE IMPLANTS: Wood Multifix anchors x 3 & Smith and Nephew Q Fix anchors x 1  OPERATIVE FINDINGS: Full-thickness rotator cuff tear involving the supraspinatus. Type II SLAP tear. Grade 3/4 chondral lesion of the humeral head  OPERATIVE PROCEDURE: The patient was met in the preoperative area. A preop history and physical was performed at the bedside. Explained the details of the operation as well as the postoperative course to the patient. The right shoulder was signed with the word yes and my initials according the hospital's correct site of surgery protocol. The patient  underwent placement of a right shoulder interscalene block with Exparel by the anesthesia service in the preoperative area.  The patient was then brought to the operating room where they underwent general endotracheal intubation.  The patient was placed in a beachchair position.  A spider arm positioner was used for this case. Examination under anesthesia revealed no limitation of motion or instability with load shift testing. The patient had a negative sulcus sign.  The patient was prepped and draped in a sterile fashion. A timeout was performed to verify the patient's name, date of birth, medical record number, correct site of surgery and correct procedure to be performed there was also used to verify the patient received antibiotics that all appropriate instruments, implants and radiographs studies were available in the room. Once all in attendance were in agreement case began.  Bony landmarks were drawn out with a surgical marker along with proposed arthroscopy incisions. An 11 blade was used to establish a posterior portal through which the arthroscope was placed in the glenohumeral joint. A full diagnostic examination of the shoulder was performed.  the anterior portal was established under direct visualization with an 18-gauge spinal needle. A 5.75 the medial arthroscopic cannula was placed through this anterior portal.   Patient was found to have a type II SLAP tear. The decision was therefore made to perform a biceps tenodesis instead of attempting to repair the superior labrum at the biceps anchor.  An Arthocare Perfect Pass suture placed in the biceps tendon and an arthroscopic tenotomy was performed arthroscopy using an arthroscopic scissor.    The arthroscope was then placed in the subacromial space. A lateral portal was then established using an 18-gauge spinal needle for localization.   A subacromial  decompression was performed using a 5.5 mm resector shaver blade from the lateral portal and  then a distal clavicle excision was performed from the anterior portal also using a 5.5 mm resector shaver blade.    Two Smith & Nephew Perfect Pass sutures were placed in the lateral border of the rotator cuff tear. The greater tuberosity was debrided using a 5.5 mm resector shaver blade to remove all remaining foreign fibers of the rotator cuff.  Debridement was performed until punctate bleeding was seen at the greater tuberosity footprint, which will allow for rotator cuff healing.  Final arthroscopic images were taken and all arthroscopic instruments were then removed and the mini-open portion of the procedure began.   A saber-type incision was made along the lateral border of the acromion. The deltoid muscle was identified and split in line with its fibers which allowed visualization of the rotator cuff.  The biceps tendon and its associated tagging suture were brought out through the deltoid split.  The Perfect Pass sutures previously placed in the lateral border of the rotator cuff were also brought out through the deltoid split.     A 2nd perfect pass suture was placed in the biceps tendon.   A tenodesis was performed placing the biceps tendon at the top of the bicipital groove with a single Ship broker.    Two additional perfect pass sutures were then placed through the lateral border of the supraspinatus tear. The lateral edge of the rotator cuff was debrided until healthy, viable tissue remained.  A single Smith & Nephew Q fix anchor was then placed at the articular margin of the humeral head. The 4 suture limbs were then passed medially through the rotator cuff with a perfect pass suture passer. These were clamped with a hemostat for later medial row fixation.  The four perfect pass sutures from the lateral border of the rotator cuff were then anchored to the greater tuberosity of the humeral head using two Dumont Multifix anchors. These anchors were tensioned to  allow for anatomic reduction of the rotator cuff to the greater tuberosity footprint.  The medial row repair was then completed using an arthroscopic knot tying technique with the Q fix anchor sutures.  Once all sutures were tied down, arthroscopic images of the double row repair were taken with the arthroscope both externally and arthroscopically from the glenohumeral joint.  All incisions were copiously irrigated. The deltoid fascia was repaired using a 0 Vicryl suturean interrupted fashion. .  The subcutaneous tissue of all incisions were closed with a 2-0 Vicryl. Skin closure for the arthroscopic incisions was performed with 4-0 nylon. The skin edges of the saber incision were approximated with a running 4-0 undyed Monocryl.  A dry sterile dressing was applied.  The patient was placed in an abduction sling, with a Polar Care sleeve.  All sharp and instrument counts were correct at the conclusion of the case. I was scrubbed and present for the entire case. I spoke with the patient's husband postoperatively to let him know the case had been performed without complication and the patient was stable in recovery room.

## 2020-09-25 ENCOUNTER — Other Ambulatory Visit: Payer: Self-pay | Admitting: Family Medicine

## 2020-09-25 DIAGNOSIS — K219 Gastro-esophageal reflux disease without esophagitis: Secondary | ICD-10-CM

## 2020-09-25 DIAGNOSIS — M75121 Complete rotator cuff tear or rupture of right shoulder, not specified as traumatic: Secondary | ICD-10-CM | POA: Diagnosis not present

## 2020-09-30 DIAGNOSIS — M75121 Complete rotator cuff tear or rupture of right shoulder, not specified as traumatic: Secondary | ICD-10-CM | POA: Diagnosis not present

## 2020-10-06 ENCOUNTER — Telehealth: Payer: Self-pay

## 2020-10-09 ENCOUNTER — Telehealth: Payer: Self-pay

## 2020-10-09 NOTE — Progress Notes (Signed)
Chronic Care Management Pharmacy Assistant   Name: Lori Potts  MRN: [id number] DOB: [birth year]  Reason for Encounter:Diabetes Disease State Call.  Patient Questions:  1.  Have you seen any other providers since your last visit? Yes, 09/17/2020 ED Surgery  2.  Any changes in your medicines or health? No .  PCP : Danelle Berry, PA-C  Allergies:   Allergies  Allergen Reactions  . Metformin And Related Diarrhea  . Adhesive [Tape] Rash and Other (See Comments)    Regular tape is ok, allergy is to paper tape    Medications: Outpatient Encounter Medications as of 10/09/2020  Medication Sig Note  . albuterol (VENTOLIN HFA) 108 (90 Base) MCG/ACT inhaler Inhale 1 puff into the lungs every 6 (six) hours as needed for wheezing or shortness of breath.   . Alcohol Swabs (B-D SINGLE USE SWABS REGULAR) PADS USE 2 (TWO) TIMES DAILY.   Marland Kitchen ascorbic acid (VITAMIN C) 500 MG tablet Take 500 mg by mouth daily.   . citalopram (CELEXA) 20 MG tablet Take 1 tablet (20 mg total) by mouth daily. For nerves, stress, anxiety symptoms   . Dulaglutide (TRULICITY) 0.75 MG/0.5ML SOPN Inject 0.75 mg into the skin once a week. 09/03/2020: Tuesdays   . esomeprazole (NEXIUM) 40 MG capsule Take 1 capsule (40 mg total) by mouth daily. Follow up in office for more refills   . fluticasone furoate-vilanterol (BREO ELLIPTA) 100-25 MCG/INH AEPB Inhale 1 puff into the lungs daily.   Marland Kitchen ipratropium-albuterol (DUONEB) 0.5-2.5 (3) MG/3ML SOLN CAN TAKE EVERY 20 MINUTES FOR 2 DOSES AS NEEDED FOR WHEEZING (Patient taking differently: Inhale 3 mLs into the lungs every 6 (six) hours as needed (SOB).)   . levocetirizine (XYZAL) 5 MG tablet Take 1 tablet (5 mg total) by mouth every evening. (Patient taking differently: Take 5 mg by mouth daily as needed for allergies.)   . losartan (COZAAR) 25 MG tablet TAKE 1 TABLET EVERY DAY (Patient taking differently: Take 25 mg by mouth daily.)   . methocarbamol (ROBAXIN) 500 MG tablet Take  500 mg by mouth every 8 (eight) hours as needed for muscle spasms.   . Multiple Vitamins-Minerals (MULTIVITAMIN WITH MINERALS) tablet Take 1 tablet by mouth daily.   . ondansetron (ZOFRAN) 4 MG tablet Take 1 tablet (4 mg total) by mouth every 8 (eight) hours as needed for nausea or vomiting.   Marland Kitchen oxyCODONE-acetaminophen (PERCOCET) 10-325 MG tablet Take 1 tablet by mouth 5 (five) times daily.    . propranolol (INDERAL) 10 MG tablet TAKE 1 TABLET BY MOUTH THREE TIMES A DAY (Patient taking differently: Take 10 mg by mouth 2 (two) times daily.)   . rosuvastatin (CRESTOR) 40 MG tablet TAKE 1 TABLET BY MOUTH EVERY DAY (Patient taking differently: Take 40 mg by mouth daily.)   . sodium chloride (OCEAN) 0.65 % SOLN nasal spray Place 1 spray into both nostrils 2 (two) times daily as needed for congestion.   . SUMAtriptan (IMITREX) 50 MG tablet TAKE 1-2 TABLETS BY MOUTH AS NEEDED FOR MIGRAINE MAY REPEAT IN 2 HOURS IF NEEDED. MAX 4/24 HOURS (Patient taking differently: Take 50-100 mg by mouth every 2 (two) hours as needed for migraine.)    No facility-administered encounter medications on file as of 10/09/2020.    Current Diagnosis: Patient Active Problem List   Diagnosis Date Noted  . Diabetes mellitus with microalbuminuria (HCC) 12/03/2018  . Microalbuminuria 01/22/2018  . Anemia 09/22/2017  . S/P knee replacement 07/28/2017  . Allergic rhinitis  05/26/2017  . Vitamin D deficiency 05/19/2017  . Chronic nonintractable headache 05/18/2017  . Special screening for malignant neoplasms, colon   . Gastroesophageal reflux disease   . Gastritis without bleeding   . 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 Insomnia 11/29/2016  . 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 Vasomotor flushing 04/19/2016  . Abdominal wall pain in right flank 03/29/2016  . Diverticulosis 02/24/2016  . Hypercholesterolemia 06/16/2015  . FHx: migraine headaches 04/13/2015  . Chronic pain  04/13/2015  . Neck pain 02/26/2015  . Status post lumbar surgery 02/26/2015  . Abnormal ECG 01/27/2015  . Angina pectoris (HCC) 01/27/2015  . 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) 01/27/2015    Goals Addressed   None    Recent Relevant Labs: Lab Results  Component Value Date/Time   HGBA1C 7.2 (H) 11/14/2019 12:06 PM   HGBA1C 6.6 03/31/2019 12:00 AM   HGBA1C 6.3 (H) 05/09/2018 09:50 AM   MICROALBUR 5.2 05/14/2019 12:00 AM   MICROALBUR 3.8 05/09/2018 09:50 AM   MICROALBUR 20 07/04/2016 10:36 AM    Kidney Function Lab Results  Component Value Date/Time   CREATININE 0.64 09/14/2020 09:37 AM   CREATININE 0.66 02/18/2020 09:37 AM   CREATININE 0.89 11/14/2019 12:06 PM   GFRNONAA >60 09/14/2020 09:37 AM   GFRNONAA 87 02/18/2020 09:37 AM   GFRAA 101 02/18/2020 09:37 AM    . Current antihyperglycemic regimen:    ? Trulicity 0.75 mg weekly . What recent interventions/DTPs have been made to improve glycemic control:  o None ID . Have there been any recent hospitalizations or ED visits since last visit with CPP?  Yes, 09/17/2020 ED Surgery on shoulder  . Patient denies hypoglycemic symptoms, including Pale, Sweaty, Shaky, Hungry, Nervous/irritable and Vision changes . Patient reports hyperglycemic symptoms, including weakness  o Patient states she will check her blood sugar and go to bed for relief.  . How often are you checking your blood sugar? Infrequently   . What are your blood sugars ranging? Patient states she had surgery on her shoulder recently causing her blood sugar to be elevated.  o Fasting: N/A o Before meals: Patient is unsure of the dates, [phone] o After meals: N/A o Bedtime: N/A . During the week, how often does your blood glucose drop below 70? Never . Are you checking your feet daily/regularly?    Patient denies numbness, pain or tingling sensation in her feet.   Patient reports she is working on returning  paperwork back to the clinical pharmacist. Patient states she is unable to drive at this time due to surgery on her shoulder.  Adherence Review: Is the patient currently on a STATIN medication? Yes Is the patient currently on ACE/ARB medication? Yes Does the patient have >5 day gap between last estimated fill dates? Yes  Reviewed chart and adherence measures. Per insurance data patient is 90-99 % adherent to rosuvastatin, 30-39% non adherent Trulicity , and 100% losartan.Will notify [name], Clinical pharmacist.    Lottie Mussel  Follow-Up:  Pharmacist Review   [name] Clinical Pharmacist Assistant [phone]

## 2020-10-14 DIAGNOSIS — M75121 Complete rotator cuff tear or rupture of right shoulder, not specified as traumatic: Secondary | ICD-10-CM | POA: Diagnosis not present

## 2020-10-16 DIAGNOSIS — M75121 Complete rotator cuff tear or rupture of right shoulder, not specified as traumatic: Secondary | ICD-10-CM | POA: Diagnosis not present

## 2020-10-20 DIAGNOSIS — M75121 Complete rotator cuff tear or rupture of right shoulder, not specified as traumatic: Secondary | ICD-10-CM | POA: Diagnosis not present

## 2020-10-27 DIAGNOSIS — M75121 Complete rotator cuff tear or rupture of right shoulder, not specified as traumatic: Secondary | ICD-10-CM | POA: Diagnosis not present

## 2020-10-29 DIAGNOSIS — M75121 Complete rotator cuff tear or rupture of right shoulder, not specified as traumatic: Secondary | ICD-10-CM | POA: Diagnosis not present

## 2020-11-05 ENCOUNTER — Ambulatory Visit (INDEPENDENT_AMBULATORY_CARE_PROVIDER_SITE_OTHER): Payer: Medicare HMO

## 2020-11-05 DIAGNOSIS — Z1231 Encounter for screening mammogram for malignant neoplasm of breast: Secondary | ICD-10-CM | POA: Diagnosis not present

## 2020-11-05 DIAGNOSIS — Z780 Asymptomatic menopausal state: Secondary | ICD-10-CM | POA: Diagnosis not present

## 2020-11-05 DIAGNOSIS — Z0000 Encounter for general adult medical examination without abnormal findings: Secondary | ICD-10-CM | POA: Diagnosis not present

## 2020-11-05 NOTE — Progress Notes (Signed)
Subjective:   Lori Potts is a 75 y.o. female who presents for Medicare Annual (Subsequent) preventive examination.  Virtual Visit via Telephone Note  I connected with  Lori Potts on 11/05/20 at  9:20 AM EDT by telephone and verified that I am speaking with the correct person using two identifiers.  Location: Patient: home Provider: [facility] Persons participating in the virtual visit: patient/Nurse Health Advisor   I discussed the limitations, risks, security and privacy concerns of performing an evaluation and management service by telephone and the availability of in person appointments. The patient expressed understanding and agreed to proceed.  Interactive audio and video telecommunications were attempted between this nurse and patient, however failed, due to patient having technical difficulties OR patient did not have access to video capability.  We continued and completed visit with audio only.  Some vital signs may be absent or patient reported.   Lori Littler, LPN    Review of Systems     Cardiac Risk Factors include: advanced age (>5men, >66 women);diabetes mellitus;dyslipidemia;hypertension     Objective:    Today's Vitals   11/05/20 0934  PainSc: 5    There is no height or weight on file to calculate BMI.  Advanced Directives 11/05/2020 09/17/2020 09/10/2020 11/05/2019 10/30/2018 09/10/2018 08/04/2018  Does Patient Have a Medical Advance Directive? Yes Yes Yes No Yes Yes Yes  Type of Estate agent of [place];Living will Living will Living will - Living will;Healthcare Power of [employer] Power of [employer] Power of [place];Living will  Does patient want to make changes to medical advance directive? - No - Patient declined - - - No - Patient declined -  Copy of Healthcare Power of Attorney in Chart? No - copy requested - - - No - copy requested No - copy requested -  Would patient like information on creating a medical  advance directive? - - - Yes (MAU/Ambulatory/Procedural Areas - Information given) - - -    Current Medications (verified) Outpatient Encounter Medications as of 11/05/2020  Medication Sig  . albuterol (VENTOLIN HFA) 108 (90 Base) MCG/ACT inhaler Inhale 1 puff into the lungs every 6 (six) hours as needed for wheezing or shortness of breath.  . Alcohol Swabs (B-D SINGLE USE SWABS REGULAR) PADS USE 2 (TWO) TIMES DAILY.  Marland Kitchen ascorbic acid (VITAMIN C) 500 MG tablet Take 500 mg by mouth daily.  Marland Kitchen aspirin 81 MG EC tablet Take 1 tablet by mouth daily.  Marland Kitchen atorvastatin (LIPITOR) 80 MG tablet Take 80 mg by mouth daily.  . Blood Glucose Monitoring Suppl (TRUE METRIX AIR GLUCOSE METER) DEVI True Metrix Glucose Meter  . citalopram (CELEXA) 20 MG tablet Take 1 tablet (20 mg total) by mouth daily. For nerves, stress, anxiety symptoms  . Dulaglutide (TRULICITY) 0.75 MG/0.5ML SOPN Inject 0.75 mg into the skin once a week.  . fluticasone furoate-vilanterol (BREO ELLIPTA) 100-25 MCG/INH AEPB Inhale 1 puff into the lungs daily.  Marland Kitchen ipratropium-albuterol (DUONEB) 0.5-2.5 (3) MG/3ML SOLN CAN TAKE EVERY 20 MINUTES FOR 2 DOSES AS NEEDED FOR WHEEZING (Patient taking differently: Inhale 3 mLs into the lungs every 6 (six) hours as needed (SOB).)  . Lancets 33G MISC TRUEplus Lancets 33 gauge  . lisinopril (ZESTRIL) 20 MG tablet Take 1 tablet by mouth daily.  Marland Kitchen losartan (COZAAR) 25 MG tablet TAKE 1 TABLET EVERY DAY (Patient taking differently: Take 25 mg by mouth daily.)  . Multiple Vitamins-Minerals (MULTIVITAMIN WITH MINERALS) tablet Take 1 tablet by mouth daily.  Marland Kitchen  oxyCODONE-acetaminophen (PERCOCET) 10-325 MG tablet Take 1 tablet by mouth 5 (five) times daily.   . propranolol (INDERAL) 10 MG tablet TAKE 1 TABLET BY MOUTH THREE TIMES A DAY (Patient taking differently: Take 10 mg by mouth 2 (two) times daily.)  . sodium chloride (OCEAN) 0.65 % SOLN nasal spray Place 1 spray into both nostrils 2 (two) times daily as needed  for congestion.  Marland Kitchen esomeprazole (NEXIUM) 40 MG capsule Take 1 capsule (40 mg total) by mouth daily. Follow up in office for more refills  . levocetirizine (XYZAL) 5 MG tablet Take 1 tablet (5 mg total) by mouth every evening. (Patient not taking: Reported on 11/05/2020)  . methocarbamol (ROBAXIN) 500 MG tablet Take 500 mg by mouth every 8 (eight) hours as needed for muscle spasms. (Patient not taking: Reported on 11/05/2020)  . SUMAtriptan (IMITREX) 50 MG tablet TAKE 1-2 TABLETS BY MOUTH AS NEEDED FOR MIGRAINE MAY REPEAT IN 2 HOURS IF NEEDED. MAX 4/24 HOURS (Patient not taking: Reported on 11/05/2020)  . [DISCONTINUED] ondansetron (ZOFRAN) 4 MG tablet Take 1 tablet (4 mg total) by mouth every 8 (eight) hours as needed for nausea or vomiting.  . [DISCONTINUED] rosuvastatin (CRESTOR) 40 MG tablet TAKE 1 TABLET BY MOUTH EVERY DAY (Patient taking differently: Take 40 mg by mouth daily.)   No facility-administered encounter medications on file as of 11/05/2020.    Allergies (verified) Metformin and related and Adhesive [tape]   History: Past Medical History:  Diagnosis Date  . Anxiety   . Arthritis   . Asthma   . 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 Cataract    right eye but immature  . Essential hypertension, benign    takes Lisinopril-HCTZ daily  . GERD (gastroesophageal reflux disease)   . Headache    sinus  . History of colon polyps    benign  . History of migraine   . History of shingles   . Hyperlipidemia    takes Lipitor daily  . Low back pain   . Nocturia   . S/P insertion of spinal cord stimulator   . Seasonal allergies    takes Singulair daily as needed  . Type II or unspecified type diabetes mellitus without mention of complication, not stated as uncontrolled   . Vitamin D deficiency    takes Vit D weekly  . Weakness    numbness and tingling left arm  . Wears dentures    full upper and lower   Past Surgical History:  Procedure Laterality Date  . ABDOMINAL  HYSTERECTOMY    . ANTERIOR CERVICAL DECOMP/DISCECTOMY FUSION N/A 02/26/2015   Procedure: ANTERIOR CERVICAL DISCECTOMY FUSION C4-5 (1 LEVEL);  Surgeon: Venita Lick, MD;  Location: [facility] OR;  Service: Orthopedics;  Laterality: N/A;  . ANTERIOR LAT LUMBAR FUSION N/A 03/21/2013   Procedure: ANTERIOR LATERAL LUMBAR FUSION 1 LEVEL/ XLIF L3-L4 ;  Surgeon: Venita Lick, MD;  Location: [facility] OR;  Service: Orthopedics;  Laterality: N/A;  . APPENDECTOMY    . AUGMENTATION MAMMAPLASTY Bilateral 1978  . BACK SURGERY    . BACK SURGERY     Lumbar fusion x 2  . BREAST EXCISIONAL BIOPSY Right 1970  . BREAST SURGERY  1990   Augementation  . CARDIAC CATHETERIZATION  5/12   ef 55%  . CARDIAC CATHETERIZATION  10/2010   [facility]; EF 55%  . CARDIAC CATHETERIZATION Left 02/16/2016   Procedure: Left Heart Cath and Coronary Angiography;  Surgeon: Alwyn Pea, MD;  Location: [facility] INVASIVE CV LAB;  Service: Cardiovascular;  Laterality: Left;  . COLONOSCOPY    . COLONOSCOPY WITH PROPOFOL N/A 04/10/2017   Procedure: COLONOSCOPY WITH PROPOFOL;  Surgeon: Midge MiniumWohl, Darren, MD;  [facility];  Service: Gastroenterology;  Laterality: N/A;  Diabetic - insulin  . ESOPHAGOGASTRODUODENOSCOPY    . JOINT REPLACEMENT    . KIDNEY SURGERY  1998   growth removed from left kidney   . LEFT HEART CATH AND CORONARY ANGIOGRAPHY Left 09/10/2018   Procedure: LEFT HEART CATH AND CORONARY ANGIOGRAPHY;  Surgeon: Alwyn Peaallwood, Dwayne D, MD;  Location: [facility] INVASIVE CV LAB;  Service: Cardiovascular;  Laterality: Left;  . pain stimulator    . POSTERIOR CERVICAL FUSION/FORAMINOTOMY Right 03/21/2013   Procedure: POSTERIOR L2-3 RIGHT FORAMINOTOMY;  Surgeon: Venita Lickahari Brooks, MD;  Location: [facility];  Service: Orthopedics;  Laterality: Right;  . REVISION OF SCAR TISSUE RECTUS MUSCLE    . SHOULDER ARTHROSCOPY WITH ROTATOR CUFF REPAIR AND SUBACROMIAL DECOMPRESSION Right 09/17/2020   Procedure: RIGHT SHOULDER ARTHROSCOPY WITH MINI-OPEN ROTATOR CUFF  REPAIR, DISTAL CLAVICLE EXCISION, AND SUBACROMIAL DECOMPRESSION WITH BICEP TENDONESIS;  Surgeon: Juanell FairlyKrasinski, Kevin, MD;  Location: [facility] ORS;  Service: Orthopedics;  Laterality: Right;  . SMALL BOWEL REPAIR    . SPINAL CORD STIMULATOR BATTERY EXCHANGE N/A 10/17/2012   Procedure: SPINAL CORD STIMULATOR BATTERY REMOVAL;  Surgeon: Venita Lickahari Brooks, MD;  Location: [facility] OR;  Service: Orthopedics;  Laterality: N/A;  . SPINAL CORD STIMULATOR BATTERY EXCHANGE N/A 07/22/2015   Procedure: REIMPLANTATION OF SPINAL CORD STIMULATOR BATTERY ;  Surgeon: Venita Lickahari Brooks, MD;  Location: [facility] OR;  Service: Orthopedics;  Laterality: N/A;  . TOTAL KNEE ARTHROPLASTY Left   . TOTAL KNEE ARTHROPLASTY Right 07/28/2017   Procedure: RIGHT TOTAL KNEE ARTHROPLASTY;  Surgeon: Eugenia Mcalpineollins, Robert, MD;  Location: [facility] ORS;  Service: Orthopedics;  Laterality: Right;   Family History  Problem Relation Age of Onset  . Heart attack Mother   . Sarcoidosis Sister    Social History   Socioeconomic History  . Marital status: Married    Spouse name: Chrissie NoaWilliam  . Number of children: 3  . Years of education: Not on file  . Highest education level: Associate degree: occupational, Scientist, product/process developmenttechnical, or vocational program  Occupational History  . Not on file  Tobacco Use  . Smoking status: Former Smoker    Types: Cigarettes    Quit date: 2006    Years since quitting: 16.2  . Smokeless tobacco: Never Used  . Tobacco comment: quit smoking 4530yrs ago  Vaping Use  . Vaping Use: Never used  Substance and Sexual Activity  . Alcohol use: No  . Drug use: No  . Sexual activity: Yes    Partners: Male    Birth control/protection: Surgical  Other Topics Concern  . Not on file  Social History Narrative  . Not on file   Social Determinants of Health   Financial Resource Strain: Low Risk   . Difficulty of Paying Living Expenses: Not very hard  Food Insecurity: No Food Insecurity  . Worried About Programme researcher, broadcasting/film/videounning Out of Food in the Last Year: Never true  . Ran Out  of Food in the Last Year: Never true  Transportation Needs: No Transportation Needs  . Lack of Transportation (Medical): No  . Lack of Transportation (Non-Medical): No  Physical Activity: Inactive  . Days of Exercise per Week: 0 days  . Minutes of Exercise per Session: 0 min  Stress: No Stress Concern Present  . Feeling of Stress : Not at all  Social Connections: Socially Integrated  .  Frequency of Communication with Friends and Family: More than three times a week  . Frequency of Social Gatherings with Friends and Family: Twice a week  . Attends Religious Services: More than 4 times per year  . Active Member of Clubs or Organizations: Yes  . Attends Banker Meetings: 1 to 4 times per year  . Marital Status: Married    Tobacco Counseling Counseling given: Not Answered Comment: quit smoking 74yrs ago   Clinical Intake:  Pre-visit preparation completed: Yes  Pain : 0-10 Pain Score: 5  Pain Type: Acute pain Pain Location: Ear Pain Orientation: Left Pain Descriptors / Indicators: Aching Pain Onset: In the past 7 days Pain Frequency: Intermittent     Nutritional Risks: None Diabetes: Yes CBG done?: No Did pt. bring in CBG monitor from home?: No  How often do you need to have someone help you when you read instructions, pamphlets, or other written materials from your doctor or pharmacy?: 1 - Never  Nutrition Risk Assessment:  Has the patient had any N/V/D within the last 2 months?  No  Does the patient have any non-healing wounds?  No  Has the patient had any unintentional weight loss or weight gain?  No   Diabetes:  Is the patient diabetic?  Yes  If diabetic, was a CBG obtained today?  No  Did the patient bring in their glucometer from home?  No  How often do you monitor your CBG's? Twice daily per patient.   Financial Strains and Diabetes Management:  Are you having any financial strains with the device, your supplies or your medication? No .   Does the patient want to be seen by Chronic Care Management for management of their diabetes?  No  Would the patient like to be referred to a Nutritionist or for Diabetic Management?  No   Diabetic Exams:  Diabetic Eye Exam: Completed per patient at [facility] vision center; will request records.   Diabetic Foot Exam: Completed 05/14/19. Pt has been advised about the importance in completing this exam.  Interpreter Needed?: No  Information entered by :: Lori Littler LPN   Activities of Daily Living In your present state of health, do you have any difficulty performing the following activities: 11/05/2020 09/10/2020  Hearing? N N  Comment declines hearing aids -  Vision? N N  Difficulty concentrating or making decisions? N N  Walking or climbing stairs? N Y  Comment - gets out of breath  Dressing or bathing? N N  Doing errands, shopping? N N  Preparing Food and eating ? N -  Using the Toilet? N -  In the past six months, have you accidently leaked urine? N -  Do you have problems with loss of bowel control? N -  Managing your Medications? N -  Managing your Finances? N -  Housekeeping or managing your Housekeeping? N -  Some recent data might be hidden    Patient Care Team: Danelle Berry, PA-C as PCP - General (Family Medicine) Gaspar Cola, [facility] (Pharmacist) Juanell Fairly, MD as Referring Physician (Orthopedic Surgery) Su Hoff, PA-C as Physician Assistant (Pain Medicine)  Indicate any recent Medical Services you may have received from other than [facility] providers in the past year (date may be approximate).     Assessment:   This is a routine wellness examination for [facility].  Hearing/Vision screen  Hearing Screening   125Hz  250Hz  500Hz  1000Hz  2000Hz  3000Hz  4000Hz  6000Hz  8000Hz   Right ear:  Left ear:           Comments: Pt denies hearing difficulty   Vision Screening Comments: [facility] screen[facility] at [facility]  Dietary issues and exercise  activities discussed: Current Exercise Habits: The patient does not participate in regular exercise at present, Exercise limited by: orthopedic condition(s)  Goals    . Chronic Care Management     CARE PLAN ENTRY (see longitudinal plan of care for additional care plan information)  Current Barriers:  . Chronic Disease Management support, education, and care coordination needs related to Hypertension, Hyperlipidemia, Diabetes, Coronary Artery Disease, GERD, and Allergic Rhinitis   Hypertension BP Readings from Last 3 Encounters:  02/18/20 118/72  11/14/19 136/64  11/05/19 138/64   . Pharmacist Clinical Goal(s): o Over the next 90 days, patient will work with PharmD and providers to maintain BP goal <130/80 . Current regimen:  o Losartan 25 mg daily  Propanolol 10 mg three times daily  . Interventions: o Discussed low salt diet and exercising as tolerated extensively o Will initiate blood pressure monitoring plan  . Patient self care activities - Over the next 90 days, patient will: o Check blood pressure 1-2 times weekly, document, and provide at future appointments o Ensure daily salt intake < 2300 mg/day  Hyperlipidemia Lab Results  Component Value Date/Time   LDLCALC 93 11/14/2019 12:06 PM   . Pharmacist Clinical Goal(s): o Over the next 90 days, patient will work with PharmD and providers to maintain LDL goal < 70 . Current regimen:  o Rosuvastatin 40 mg daily  . Interventions: o Discussed low cholesterol diet and exercising as tolerated extensively o Will initiate cholesterol monitoring plan   Diabetes Lab Results  Component Value Date/Time   HGBA1C 7.2 (H) 11/14/2019 12:06 PM   HGBA1C 6.6 03/31/2019 12:00 AM   HGBA1C 6.3 (H) 05/09/2018 09:50 AM   . Pharmacist Clinical Goal(s): o Over the next 90 days, patient will work with PharmD and providers to achieve A1c goal <7% . Current regimen:  o Trulicity 0.75 mg weekly . Interventions: o Discussed carbohydrate  counting and exercising as tolerated extensively o Will initiate blood sugar monitoring plan  o Will start PAP Application for Trulicity for 2022.  Marland Kitchen Patient self care activities - Over the next 90 days, patient will: o Check blood sugar once daily, document, and provide at future appointments o Contact provider with any episodes of hypoglycemia o Provide the necessary supplementary documents (proof of household income) needed for medication assistance applications to [facility] pharmacist.   Medication management . Pharmacist Clinical Goal(s): o Over the next 90 days, patient will work with PharmD and providers to maintain optimal medication adherence . [facility] . Interventions o Comprehensive medication review performed. o Continue current medication management strategy . Patient self care activities - Over the next 90 days, patient will o Take medications as prescribed o Report any questions or concerns to PharmD and/or provider(s)    . Patient Stated     Patient states she would like to stay active and possibly take some courses online.       Depression Screen PHQ 2/9 Scores 11/05/2020 03/25/2020 02/18/2020 11/14/2019 11/14/2019 11/05/2019 10/22/2019  PHQ - 2 Score 0 0 0 0 0 0 0  PHQ- 9 Score - 0 0 2 0 - 0    Fall Risk Fall Risk  11/05/2020 03/25/2020 02/18/2020 11/14/2019 11/05/2019  Falls in the past year? 1 0 0 0 1  Comment - - - - -  Number falls in past yr: 1 0 0 0 1  Injury with Fall? 0 0 0 0 1  Risk for fall due to : History of fall(s) - - - History of fall(s)  Follow up Falls prevention discussed - Falls evaluation completed - Falls prevention discussed    FALL RISK PREVENTION PERTAINING TO THE HOME:  Any stairs in or around the home? Yes  If so, are there any without handrails? No  Home free of loose throw rugs in walkways, pet beds, electrical cords, etc? Yes  Adequate lighting in your home to reduce risk of falls? Yes   ASSISTIVE DEVICES  UTILIZED TO PREVENT FALLS:  Life alert? No  Use of a cane, walker or w/c? No  Grab bars in the bathroom? No  Shower chair or bench in shower? No  Elevated toilet seat or a handicapped toilet? No   TIMED UP AND GO:  Was the test performed? No . Telephonic visit.   Cognitive Function: Normal cognitive status assessed by direct observation by this Nurse Health Advisor. No abnormalities found.       6CIT Screen 11/05/2019 10/30/2018  What Year? 0 points 0 points  What month? 0 points 0 points  What time? 0 points 0 points  Count back from 20 0 points 0 points  Months in reverse 4 points 0 points  Repeat phrase 0 points 2 points  Total Score 4 2    Immunizations Immunization History  Administered Date(s) Administered  . Fluad Quad(high Dose 65+) 05/08/2019  . Influenza, High Dose Seasonal PF 06/10/2015, 06/20/2016, 05/09/2017, 05/09/2018, 04/17/2020  . PFIZER(Purple Top)SARS-COV-2 Vaccination 09/11/2019, 10/02/2019, 06/08/2020  . Pneumococcal Conjugate-13 11/28/2013  . Pneumococcal Polysaccharide-23 03/16/2018  . Tdap 05/08/2019    TDAP status: Up to date  Flu Vaccine status: Up to date  Pneumococcal vaccine status: Up to date  Covid-19 vaccine status: Completed vaccines  Qualifies for Shingles Vaccine? Yes   Zostavax completed No   Shingrix Completed?: No.    Education has been provided regarding the importance of this vaccine. Patient has been advised to call insurance company to determine out of pocket expense if they have not yet received this vaccine. Advised may also receive vaccine at local pharmacy or Health Dept. Verbalized acceptance and understanding.  Screening Tests Health Maintenance  Topic Date Due  . OPHTHALMOLOGY EXAM  12/27/2017  . MAMMOGRAM  05/04/2018  . FOOT EXAM  05/13/2020  . HEMOGLOBIN A1C  05/16/2020  . COLONOSCOPY (Pts 45-10yrs Insurance coverage will need to be confirmed)  04/11/2027  . TETANUS/TDAP  05/07/2029  . INFLUENZA VACCINE   Completed  . DEXA SCAN  Completed  . COVID-19 Vaccine  Completed  . Hepatitis C Screening  Completed  . PNA vac Low Risk Adult  Completed  . HPV VACCINES  Aged Out    Health Maintenance  Health Maintenance Due  Topic Date Due  . OPHTHALMOLOGY EXAM  12/27/2017  . MAMMOGRAM  05/04/2018  . FOOT EXAM  05/13/2020  . HEMOGLOBIN A1C  05/16/2020    Colorectal cancer screening: Type of screening: Colonoscopy. Completed 04/10/17. Repeat every 10 years  Mammogram status: Completed 05/04/17. Repeat every year. Ordered today.   Bone Density status: Completed 09/26/13. Results reflect: Bone density results: NORMAL. Repeat every 2 years. ordered today.   Lung Cancer Screening: (Low Dose CT Chest recommended if Age 57-80 years, 30 pack-year currently smoking OR have quit w/in 15years.) does not qualify.   Additional Screening:  Hepatitis C Screening: does qualify; Completed  05/26/17  Vision Screening: Recommended annual ophthalmology exams for early detection of glaucoma and other disorders of the eye. Is the patient up to date with their annual eye exam?  Yes  Who is the provider or what is the name of the office in which the patient attends annual eye exams? [facility]  Dental Screening: Recommended annual dental exams for proper oral hygiene  Community Resource Referral / Chronic Care Management: CRR required this visit?  No   CCM required this visit?  No      Plan:     I have personally reviewed and noted the following in the patient's chart:   . Medical and social history . Use of alcohol, tobacco or illicit drugs  . Current medications and supplements . Functional ability and status . Nutritional status . Physical activity . Advanced directives . List of other physicians . Hospitalizations, surgeries, and ER visits in previous 12 months . Vitals . Screenings to include cognitive, depression, and falls . Referrals and appointments  In addition, I have reviewed  and discussed with patient certain preventive protocols, quality metrics, and best practice recommendations. A written personalized care plan for preventive services as well as general preventive health recommendations were provided to patient.     Lori Littler, LPN   [id number]/96/2952   Nurse Notes: pt c/o intermittent left ear pain x 1 week. Pt states she is going to try taking allergy medicine to see if that help; advised to contact office for appointment if sxs worsen or persist.

## 2020-11-05 NOTE — Patient Instructions (Signed)
Lori Potts , Thank you for taking time to come for your Medicare Wellness Visit. I appreciate your ongoing commitment to your health goals. Please review the following plan we discussed and let me know if I can assist you in the future.   Screening recommendations/referrals: Colonoscopy: done 04/10/17. Repeat in 2028 Mammogram: done 05/04/17. Please call [phone] to schedule your mammogram and bone density screening  Bone Density: done 09/26/13 Recommended yearly ophthalmology/optometry visit for glaucoma screening and checkup Recommended yearly dental visit for hygiene and checkup  Vaccinations: Influenza vaccine: done 04/17/20 Pneumococcal vaccine: done 03/16/18 Tdap vaccine: done 05/08/19 Shingles vaccine: Shingrix discussed. Please contact your pharmacy for coverage information.  Covid-19:done 09/11/19, 10/02/19 & 10/02/19  Advanced directives: Please bring a copy of your health care power of attorney and living will to the office at your convenience.  Conditions/risks identified: Recommend increasing physical activity   Next appointment: Follow up in one year for your annual wellness visit    Preventive Care 65 Years and Older, Female Preventive care refers to lifestyle choices and visits with your health care provider that can promote health and wellness. What does preventive care include?  A yearly physical exam. This is also called an annual well check.  Dental exams once or twice a year.  Routine eye exams. Ask your health care provider how often you should have your eyes checked.  Personal lifestyle choices, including:  Daily care of your teeth and gums.  Regular physical activity.  Eating a healthy diet.  Avoiding tobacco and drug use.  Limiting alcohol use.  Practicing safe sex.  Taking low-dose aspirin every day.  Taking vitamin and mineral supplements as recommended by your health care provider. What happens during an annual well check? The services and  screenings done by your health care provider during your annual well check will depend on your age, overall health, lifestyle risk factors, and family history of disease. Counseling  Your health care provider may ask you questions about your:  Alcohol use.  Tobacco use.  Drug use.  Emotional well-being.  Home and relationship well-being.  Sexual activity.  Eating habits.  History of falls.  Memory and ability to understand (cognition).  Work and work Astronomer.  Reproductive health. Screening  You may have the following tests or measurements:  Height, weight, and BMI.  Blood pressure.  Lipid and cholesterol levels. These may be checked every 5 years, or more frequently if you are over 72 years old.  Skin check.  Lung cancer screening. You may have this screening every year starting at age 90 if you have a 30-pack-year history of smoking and currently smoke or have quit within the past 15 years.  Fecal occult blood test (FOBT) of the stool. You may have this test every year starting at age 71.  Flexible sigmoidoscopy or colonoscopy. You may have a sigmoidoscopy every 5 years or a colonoscopy every 10 years starting at age 75.  Hepatitis C blood test.  Hepatitis B blood test.  Sexually transmitted disease (STD) testing.  Diabetes screening. This is done by checking your blood sugar (glucose) after you have not eaten for a while (fasting). You may have this done every 1-3 years.  Bone density scan. This is done to screen for osteoporosis. You may have this done starting at age 28.  Mammogram. This may be done every 1-2 years. Talk to your health care provider about how often you should have regular mammograms. Talk with your health care provider about your test  results, treatment options, and if necessary, the need for more tests. Vaccines  Your health care provider may recommend certain vaccines, such as:  Influenza vaccine. This is recommended every  year.  Tetanus, diphtheria, and acellular pertussis (Tdap, Td) vaccine. You may need a Td booster every 10 years.  Zoster vaccine. You may need this after age 61.  Pneumococcal 13-valent conjugate (PCV13) vaccine. One dose is recommended after age 69.  Pneumococcal polysaccharide (PPSV23) vaccine. One dose is recommended after age 93. Talk to your health care provider about which screenings and vaccines you need and how often you need them. This information is not intended to replace advice given to you by your health care provider. Make sure you discuss any questions you have with your health care provider. Document Released: 09/04/2015 Document Revised: 04/27/2016 Document Reviewed: 06/09/2015 Elsevier Interactive Patient Education  2017 [place] Prevention in the Home Falls can cause injuries. They can happen to people of all ages. There are many things you can do to make your home safe and to help prevent falls. What can I do on the outside of my home?  Regularly fix the edges of walkways and driveways and fix any cracks.  Remove anything that might make you trip as you walk through a door, such as a raised step or threshold.  Trim any bushes or trees on the path to your home.  Use bright outdoor lighting.  Clear any walking paths of anything that might make someone trip, such as rocks or tools.  Regularly check to see if handrails are loose or broken. Make sure that both sides of any steps have handrails.  Any raised decks and porches should have guardrails on the edges.  Have any leaves, snow, or ice cleared regularly.  Use sand or salt on walking paths during winter.  Clean up any spills in your garage right away. This includes oil or grease spills. What can I do in the bathroom?  Use night lights.  Install grab bars by the toilet and in the tub and shower. Do not use towel bars as grab bars.  Use non-skid mats or decals in the tub or shower.  If you  need to sit down in the shower, use a plastic, non-slip stool.  Keep the floor dry. Clean up any water that spills on the floor as soon as it happens.  Remove soap buildup in the tub or shower regularly.  Attach bath mats securely with double-sided non-slip rug tape.  Do not have throw rugs and other things on the floor that can make you trip. What can I do in the bedroom?  Use night lights.  Make sure that you have a light by your bed that is easy to reach.  Do not use any sheets or blankets that are too big for your bed. They should not hang down onto the floor.  Have a firm chair that has side arms. You can use this for support while you get dressed.  Do not have throw rugs and other things on the floor that can make you trip. What can I do in the kitchen?  Clean up any spills right away.  Avoid walking on wet floors.  Keep items that you use a lot in easy-to-reach places.  If you need to reach something above you, use a strong step stool that has a grab bar.  Keep electrical cords out of the way.  Do not use floor polish or wax that makes  floors slippery. If you must use wax, use non-skid floor wax.  Do not have throw rugs and other things on the floor that can make you trip. What can I do with my stairs?  Do not leave any items on the stairs.  Make sure that there are handrails on both sides of the stairs and use them. Fix handrails that are broken or loose. Make sure that handrails are as long as the stairways.  Check any carpeting to make sure that it is firmly attached to the stairs. Fix any carpet that is loose or worn.  Avoid having throw rugs at the top or bottom of the stairs. If you do have throw rugs, attach them to the floor with carpet tape.  Make sure that you have a light switch at the top of the stairs and the bottom of the stairs. If you do not have them, ask someone to add them for you. What else can I do to help prevent falls?  Wear shoes  that:  Do not have high heels.  Have rubber bottoms.  Are comfortable and fit you well.  Are closed at the toe. Do not wear sandals.  If you use a stepladder:  Make sure that it is fully opened. Do not climb a closed stepladder.  Make sure that both sides of the stepladder are locked into place.  Ask someone to hold it for you, if possible.  Clearly mark and make sure that you can see:  Any grab bars or handrails.  First and last steps.  Where the edge of each step is.  Use tools that help you move around (mobility aids) if they are needed. These include:  Canes.  Walkers.  Scooters.  Crutches.  Turn on the lights when you go into a dark area. Replace any light bulbs as soon as they burn out.  Set up your furniture so you have a clear path. Avoid moving your furniture around.  If any of your floors are uneven, fix them.  If there are any pets around you, be aware of where they are.  Review your medicines with your doctor. Some medicines can make you feel dizzy. This can increase your chance of falling. Ask your doctor what other things that you can do to help prevent falls. This information is not intended to replace advice given to you by your health care provider. Make sure you discuss any questions you have with your health care provider. Document Released: 06/04/2009 Document Revised: 01/14/2016 Document Reviewed: 09/12/2014 Elsevier Interactive Patient Education  2017 [employer].

## 2020-11-12 ENCOUNTER — Telehealth: Payer: Self-pay

## 2020-11-12 NOTE — Progress Notes (Addendum)
Chronic Care Management Pharmacy Assistant   Name: Lori Potts  MRN: [id number] DOB: Jul 31, 1946  Reason for Encounter: Medication Review /Patient assistance for trulicity.   Recent office visits:  None ID  Recent consult visits:  None ID  Hospital visits:  None in previous 6 months  Medications: Outpatient Encounter Medications as of 11/12/2020  Medication Sig Note   albuterol (VENTOLIN HFA) 108 (90 Base) MCG/ACT inhaler Inhale 1 puff into the lungs every 6 (six) hours as needed for wheezing or shortness of breath.    Alcohol Swabs (B-D SINGLE USE SWABS REGULAR) PADS USE 2 (TWO) TIMES DAILY.    ascorbic acid (VITAMIN C) 500 MG tablet Take 500 mg by mouth daily.    aspirin 81 MG EC tablet Take 1 tablet by mouth daily.    atorvastatin (LIPITOR) 80 MG tablet Take 80 mg by mouth daily. 11/05/2020: Pt taking 1/2 tablet daily    Blood Glucose Monitoring Suppl (TRUE METRIX AIR GLUCOSE METER) DEVI True Metrix Glucose Meter    citalopram (CELEXA) 20 MG tablet Take 1 tablet (20 mg total) by mouth daily. For nerves, stress, anxiety symptoms    Dulaglutide (TRULICITY) 0.75 MG/0.5ML SOPN Inject 0.75 mg into the skin once a week. 09/03/2020: Tuesdays    esomeprazole (NEXIUM) 40 MG capsule Take 1 capsule (40 mg total) by mouth daily. Follow up in office for more refills    fluticasone furoate-vilanterol (BREO ELLIPTA) 100-25 MCG/INH AEPB Inhale 1 puff into the lungs daily.    ipratropium-albuterol (DUONEB) 0.5-2.5 (3) MG/3ML SOLN CAN TAKE EVERY 20 MINUTES FOR 2 DOSES AS NEEDED FOR WHEEZING (Patient taking differently: Inhale 3 mLs into the lungs every 6 (six) hours as needed (SOB).)    Lancets 33G MISC TRUEplus Lancets 33 gauge    levocetirizine (XYZAL) 5 MG tablet Take 1 tablet (5 mg total) by mouth every evening. (Patient not taking: Reported on 11/05/2020)    lisinopril (ZESTRIL) 20 MG tablet Take 1 tablet by mouth daily.    losartan (COZAAR) 25 MG tablet TAKE 1 TABLET  EVERY DAY (Patient taking differently: Take 25 mg by mouth daily.)    methocarbamol (ROBAXIN) 500 MG tablet Take 500 mg by mouth every 8 (eight) hours as needed for muscle spasms. (Patient not taking: Reported on 11/05/2020)    Multiple Vitamins-Minerals (MULTIVITAMIN WITH MINERALS) tablet Take 1 tablet by mouth daily.    oxyCODONE-acetaminophen (PERCOCET) 10-325 MG tablet Take 1 tablet by mouth 5 (five) times daily.     propranolol (INDERAL) 10 MG tablet TAKE 1 TABLET BY MOUTH THREE TIMES A DAY (Patient taking differently: Take 10 mg by mouth 2 (two) times daily.)    sodium chloride (OCEAN) 0.65 % SOLN nasal spray Place 1 spray into both nostrils 2 (two) times daily as needed for congestion.    SUMAtriptan (IMITREX) 50 MG tablet TAKE 1-2 TABLETS BY MOUTH AS NEEDED FOR MIGRAINE MAY REPEAT IN 2 HOURS IF NEEDED. MAX 4/24 HOURS (Patient not taking: Reported on 11/05/2020) 11/05/2020: Needs refill    No facility-administered encounter medications on file as of 11/12/2020.    Star Rating Drugs:atorvastatin,Trulicity, lisinopril,losartan,  Patient states her Trulicity was 500 dollars at her current pharmacy.Patient states she is unable to afford the medication at this time.Patient states she is going to bring the patient assistance application back to her PCP office today. Notified clinical pharmacist.  Per clinical pharmacist she will have samples of Trulicity waiting for her at the front desk at her PCP office.  Patient verbalized understanding and confirm she will drop off the application and pick up the samples of trulicity.   Per Clinical pharmacist the samples we had in clinic were the wrong dose.Patient PCP is reaching out to see if we can get samples for her tomorrow, but in the meantime reach out to [employer] for a  Voucher to get her one month until she gets approved.  Reach out to [facility] to ask about getting the patient a voucher,per [facility] patient will be unable to get a  voucher because she has medicare.Per [facility], they will expedite the patient application , if the application is  sent  to fax number : [phone].  Inform [facility] per clinical Pharmacist application and income  was sent on 11/12/2020 at 3:00 pm from fax number [phone].Will send the portion of the provider information next week because the provider is out of office.[facility] care verbalized understanding and will begin the process of the application.Notifed Clinical Pharmacist.  [name] Clinical Pharmacist Assistant [phone]

## 2020-11-13 DIAGNOSIS — M75121 Complete rotator cuff tear or rupture of right shoulder, not specified as traumatic: Secondary | ICD-10-CM | POA: Diagnosis not present

## 2020-11-16 ENCOUNTER — Telehealth: Payer: Self-pay | Admitting: Family Medicine

## 2020-11-16 DIAGNOSIS — F419 Anxiety disorder, unspecified: Secondary | ICD-10-CM

## 2020-11-16 DIAGNOSIS — F430 Acute stress reaction: Secondary | ICD-10-CM

## 2020-11-16 DIAGNOSIS — R809 Proteinuria, unspecified: Secondary | ICD-10-CM

## 2020-11-17 NOTE — Telephone Encounter (Signed)
appt scheduled for this Thursday with Sheliah Mends

## 2020-11-18 DIAGNOSIS — M75121 Complete rotator cuff tear or rupture of right shoulder, not specified as traumatic: Secondary | ICD-10-CM | POA: Diagnosis not present

## 2020-11-18 DIAGNOSIS — M5416 Radiculopathy, lumbar region: Secondary | ICD-10-CM | POA: Diagnosis not present

## 2020-11-19 ENCOUNTER — Ambulatory Visit: Payer: Medicare HMO | Admitting: Family Medicine

## 2020-11-19 DIAGNOSIS — R809 Proteinuria, unspecified: Secondary | ICD-10-CM

## 2020-11-19 DIAGNOSIS — K219 Gastro-esophageal reflux disease without esophagitis: Secondary | ICD-10-CM

## 2020-11-19 DIAGNOSIS — G8929 Other chronic pain: Secondary | ICD-10-CM

## 2020-11-19 DIAGNOSIS — I10 Essential (primary) hypertension: Secondary | ICD-10-CM

## 2020-11-20 ENCOUNTER — Encounter: Payer: Self-pay | Admitting: Physician Assistant

## 2020-11-20 ENCOUNTER — Telehealth (INDEPENDENT_AMBULATORY_CARE_PROVIDER_SITE_OTHER): Payer: Medicare HMO | Admitting: Physician Assistant

## 2020-11-23 DIAGNOSIS — M461 Sacroiliitis, not elsewhere classified: Secondary | ICD-10-CM | POA: Diagnosis not present

## 2020-11-23 DIAGNOSIS — M25551 Pain in right hip: Secondary | ICD-10-CM | POA: Diagnosis not present

## 2020-11-23 DIAGNOSIS — M5459 Other low back pain: Secondary | ICD-10-CM | POA: Diagnosis not present

## 2020-11-25 ENCOUNTER — Ambulatory Visit: Payer: Medicare HMO | Admitting: Family Medicine

## 2020-11-25 ENCOUNTER — Other Ambulatory Visit: Payer: Self-pay

## 2020-11-25 ENCOUNTER — Ambulatory Visit (INDEPENDENT_AMBULATORY_CARE_PROVIDER_SITE_OTHER): Payer: Medicare HMO | Admitting: Physician Assistant

## 2020-11-25 ENCOUNTER — Other Ambulatory Visit: Payer: Self-pay | Admitting: Family Medicine

## 2020-11-25 ENCOUNTER — Encounter: Payer: Self-pay | Admitting: Physician Assistant

## 2020-11-25 VITALS — BP 124/82 | HR 86 | Temp 97.8°F | Resp 16 | Ht 63.5 in | Wt 184.0 lb

## 2020-11-25 DIAGNOSIS — G8929 Other chronic pain: Secondary | ICD-10-CM

## 2020-11-25 DIAGNOSIS — R21 Rash and other nonspecific skin eruption: Secondary | ICD-10-CM

## 2020-11-25 DIAGNOSIS — R809 Proteinuria, unspecified: Secondary | ICD-10-CM

## 2020-11-25 DIAGNOSIS — Z794 Long term (current) use of insulin: Secondary | ICD-10-CM

## 2020-11-25 DIAGNOSIS — H9202 Otalgia, left ear: Secondary | ICD-10-CM

## 2020-11-25 DIAGNOSIS — I700 Atherosclerosis of aorta: Secondary | ICD-10-CM

## 2020-11-25 DIAGNOSIS — M75121 Complete rotator cuff tear or rupture of right shoulder, not specified as traumatic: Secondary | ICD-10-CM | POA: Diagnosis not present

## 2020-11-25 DIAGNOSIS — D709 Neutropenia, unspecified: Secondary | ICD-10-CM | POA: Diagnosis not present

## 2020-11-25 DIAGNOSIS — I10 Essential (primary) hypertension: Secondary | ICD-10-CM

## 2020-11-25 DIAGNOSIS — R519 Headache, unspecified: Secondary | ICD-10-CM

## 2020-11-25 DIAGNOSIS — I209 Angina pectoris, unspecified: Secondary | ICD-10-CM | POA: Diagnosis not present

## 2020-11-25 DIAGNOSIS — E1329 Other specified diabetes mellitus with other diabetic kidney complication: Secondary | ICD-10-CM

## 2020-11-25 LAB — POCT GLYCOSYLATED HEMOGLOBIN (HGB A1C): Hemoglobin A1C: 7.2 % — AB (ref 4.0–5.6)

## 2020-11-25 MED ORDER — RIZATRIPTAN BENZOATE 5 MG PO TBDP: ORAL_TABLET | ORAL | 0 refills | Status: DC

## 2020-11-25 NOTE — Patient Instructions (Signed)
Diabetes Mellitus and Exercise Exercising regularly is important for overall health, especially for people who have diabetes mellitus. Exercising is not only about losing weight. It has many other health benefits, such as increasing muscle strength and bone density and reducing body fat and stress. This leads to improved fitness, flexibility, and endurance, all of which result in better overall health. What are the benefits of exercise if I have diabetes? Exercise has many benefits for people with diabetes. They include:  Helping to lower and control blood sugar (glucose).  Helping the body to respond better to the hormone insulin by improving insulin sensitivity.  Reducing how much insulin the body needs.  Lowering the risk for heart disease by: ? Lowering "bad" cholesterol and triglyceride levels. ? Increasing "good" cholesterol levels. ? Lowering blood pressure. ? Lowering blood glucose levels. What is my activity plan? Your health care provider or certified diabetes educator can help you make a plan for the type and frequency of exercise that works for you. This is called your activity plan. Be sure to:  Get at least 150 minutes of medium-intensity or high-intensity exercise each week. Exercises may include brisk walking, biking, or water aerobics.  Do stretching and strengthening exercises, such as yoga or weight lifting, at least 2 times a week.  Spread out your activity over at least 3 days of the week.  Get some form of physical activity each day. ? Do not go more than 2 days in a row without some kind of physical activity. ? Avoid being inactive for more than 90 minutes at a time. Take frequent breaks to walk or stretch.  Choose exercises or activities that you enjoy. Set realistic goals.  Start slowly and gradually increase your exercise intensity over time.   How do I manage my diabetes during exercise? Monitor your blood glucose  Check your blood glucose before and  after exercising. If your blood glucose is: ? 240 mg/dL (13.3 mmol/L) or higher before you exercise, check your urine for ketones. These are chemicals created by the liver. If you have ketones in your urine, do not exercise until your blood glucose returns to normal. ? 100 mg/dL (5.6 mmol/L) or lower, eat a snack containing 15-20 grams of carbohydrate. Check your blood glucose 15 minutes after the snack to make sure that your glucose level is above 100 mg/dL (5.6 mmol/L) before you start your exercise.  Know the symptoms of low blood glucose (hypoglycemia) and how to treat it. Your risk for hypoglycemia increases during and after exercise. Follow these tips and your health care provider's instructions  Keep a carbohydrate snack that is fast-acting for use before, during, and after exercise to help prevent or treat hypoglycemia.  Avoid injecting insulin into areas of the body that are going to be exercised. For example, avoid injecting insulin into: ? Your arms, when you are about to play tennis. ? Your legs, when you are about to go jogging.  Keep records of your exercise habits. Doing this can help you and your health care provider adjust your diabetes management plan as needed. Write down: ? Food that you eat before and after you exercise. ? Blood glucose levels before and after you exercise. ? The type and amount of exercise you have done.  Work with your health care provider when you start a new exercise or activity. He or she may need to: ? Make sure that the activity is safe for you. ? Adjust your insulin, other medicines, and food that   you eat.  Drink plenty of water while you exercise. This prevents loss of water (dehydration) and problems caused by a lot of heat in the body (heat stroke).   Where to find more information  American Diabetes Association: www.diabetes.org Summary  Exercising regularly is important for overall health, especially for people who have diabetes  mellitus.  Exercising has many health benefits. It increases muscle strength and bone density and reduces body fat and stress. It also lowers and controls blood glucose.  Your health care provider or certified diabetes educator can help you make an activity plan for the type and frequency of exercise that works for you.  Work with your health care provider to make sure any new activity is safe for you. Also work with your health care provider to adjust your insulin, other medicines, and the food you eat. This information is not intended to replace advice given to you by your health care provider. Make sure you discuss any questions you have with your health care provider. Document Revised: 05/06/2019 Document Reviewed: 05/06/2019 Elsevier Patient Education  2021 [employer]  

## 2020-11-25 NOTE — Progress Notes (Signed)
Established patient visit   Patient: Lori Potts   DOB: [birth year]   75 y.o. Female  MRN: [id number] Visit Date: 11/25/2020  Today's healthcare provider: Trey Sailors, PA-C   Chief Complaint  Patient presents with  . Diabetes  . Hypertension  . Hyperlipidemia    Follow up   Subjective    HPI HPI    Hyperlipidemia    Comments: Follow up       Last edited by Riesa Pope, RMA on 11/25/2020  7:58 AM. (History)      Diabetes Mellitus Type II, Follow-up  Lab Results  Component Value Date   HGBA1C 7.2 (A) 11/25/2020   HGBA1C 7.2 (H) 11/14/2019   HGBA1C 6.6 03/31/2019   Wt Readings from Last 3 Encounters:  11/25/20 184 lb (83.5 kg)  11/20/20 180 lb (81.6 kg)  09/17/20 185 lb (83.9 kg)   Last seen for diabetes 8 months ago.  Management since then includes trulicity 0.75 mg subQ once weekly. She reports good compliance with treatment. She is not having side effects.  Symptoms: No fatigue No foot ulcerations  No appetite changes No nausea  No paresthesia of the feet  No polydipsia  No polyuria No visual disturbances   No vomiting     Home blood sugar records: not checked  Episodes of hypoglycemia? No    Current insulin regiment: none Most Recent Eye Exam: 2018 Current exercise: none Current diet habits: well balanced  Pertinent Labs: Lab Results  Component Value Date   CHOL 238 (H) 11/25/2020   HDL 61 11/25/2020   LDLCALC 154 (H) 11/25/2020   TRIG 112 11/25/2020   CHOLHDL 3.9 11/25/2020   Lab Results  Component Value Date   NA 141 11/25/2020   K 4.4 11/25/2020   CREATININE 0.64 11/25/2020   GFRNONAA >60 09/14/2020   GFRAA 101 02/18/2020   GLUCOSE 113 (H) 11/25/2020     --------------------------------------------------------------------------------------------------- Hypertension, follow-up  BP Readings from Last 3 Encounters:  11/25/20 124/82  09/17/20 (!) 168/69  02/18/20 118/72   Wt Readings from Last 3 Encounters:   11/25/20 184 lb (83.5 kg)  11/20/20 180 lb (81.6 kg)  09/17/20 185 lb (83.9 kg)     She was last seen for hypertension 8 months ago.  BP at that visit was normal. Management since that visit includes losartan 25 mg daily and propranolol 10 mg BID.  She reports good compliance with treatment. She is not having side effects.  She is following a Regular diet. She is not exercising. She does not smoke.  Use of agents associated with hypertension: none.   Outside blood pressures are not checked. Symptoms: No chest pain No chest pressure  No palpitations No syncope  No dyspnea No orthopnea  No paroxysmal nocturnal dyspnea No lower extremity edema   Pertinent labs: Lab Results  Component Value Date   CHOL 238 (H) 11/25/2020   HDL 61 11/25/2020   LDLCALC 154 (H) 11/25/2020   TRIG 112 11/25/2020   CHOLHDL 3.9 11/25/2020   Lab Results  Component Value Date   NA 141 11/25/2020   K 4.4 11/25/2020   CREATININE 0.64 11/25/2020   GFRNONAA >60 09/14/2020   GFRAA 101 02/18/2020   GLUCOSE 113 (H) 11/25/2020     The 10-year ASCVD risk score Denman George DC Jr., et al., 2013) is: 35.5%   --------------------------------------------------------------------------------------------------- Lipid/Cholesterol, Follow-up  Last lipid panel Other pertinent labs  Lab Results  Component Value Date   CHOL 238 (H)  11/25/2020   HDL 61 11/25/2020   LDLCALC 154 (H) 11/25/2020   TRIG 112 11/25/2020   CHOLHDL 3.9 11/25/2020   Lab Results  Component Value Date   ALT 17 11/25/2020   AST 19 11/25/2020   PLT 414 (H) 09/14/2020   TSH 2.00 02/18/2020     She was last seen for this 8 months ago.  Management since that visit includes continue lipitor 80 mg.  She reports good compliance with treatment. She is not having side effects.   Symptoms: No chest pain No chest pressure/discomfort  No dyspnea No lower extremity edema  No numbness or tingling of extremity No orthopnea  No palpitations No  paroxysmal nocturnal dyspnea  No speech difficulty No syncope   Current diet: well balanced Current exercise: none  The 10-year ASCVD risk score Denman George(Goff DC Jr., et al., 2013) is: 35.5%  ---------------------------------------------------------------------------------------------------  Hypertension, follow-up  BP Readings from Last 3 Encounters:  11/25/20 124/82  09/17/20 (!) 168/69  02/18/20 118/72   Wt Readings from Last 3 Encounters:  11/25/20 184 lb (83.5 kg)  11/20/20 180 lb (81.6 kg)  09/17/20 185 lb (83.9 kg)     She was last seen for hypertension 9 months ago.  BP at that visit was normal. Management since that visit includes continue losartan 25 mg daily and propranolol 10 mg twice daily. .  She reports good compliance with treatment. She is not having side effects.  She is following a Regular diet. She is not exercising. She does not smoke.  Use of agents associated with hypertension: none.   Outside blood pressures are not checked. Symptoms: No chest pain No chest pressure  No palpitations No syncope  No dyspnea No orthopnea  No paroxysmal nocturnal dyspnea No lower extremity edema   Pertinent labs: Lab Results  Component Value Date   CHOL 238 (H) 11/25/2020   HDL 61 11/25/2020   LDLCALC 154 (H) 11/25/2020   TRIG 112 11/25/2020   CHOLHDL 3.9 11/25/2020   Lab Results  Component Value Date   NA 141 11/25/2020   K 4.4 11/25/2020   CREATININE 0.64 11/25/2020   GFRNONAA >60 09/14/2020   GFRAA 101 02/18/2020   GLUCOSE 113 (H) 11/25/2020     The 10-year ASCVD risk score Denman George(Goff DC Jr., et al., 2013) is: 35.5%   --------------------------------------------------------------------------------------------------- History of migraines. Reports she went to neurology previously and nothing was done there. She was placed on imitrex which she reports does not work.      Medications: Outpatient Medications Prior to Visit  Medication Sig Note  . albuterol  (VENTOLIN HFA) 108 (90 Base) MCG/ACT inhaler Inhale 1 puff into the lungs every 6 (six) hours as needed for wheezing or shortness of breath.   Marland Kitchen. ascorbic acid (VITAMIN C) 500 MG tablet Take 500 mg by mouth daily.   Marland Kitchen. aspirin 81 MG EC tablet Take 1 tablet by mouth daily.   . citalopram (CELEXA) 20 MG tablet TAKE 1 TABLET DAILY FOR NERVES, STRESS, ANXIETY SYMPTOMS (DOSE CHANGE)   . Dulaglutide (TRULICITY) 0.75 MG/0.5ML SOPN Inject 0.75 mg into the skin once a week.   . fluticasone furoate-vilanterol (BREO ELLIPTA) 100-25 MCG/INH AEPB Inhale 1 puff into the lungs daily.   Marland Kitchen. ipratropium-albuterol (DUONEB) 0.5-2.5 (3) MG/3ML SOLN CAN TAKE 3ML EVERY 20 MINUTES FOR 2 DOSES AS NEEDED FOR WHEEZING (Patient taking differently: Inhale 3 mLs into the lungs every 6 (six) hours as needed (SOB).)   . Lancets 33G MISC TRUEplus Lancets 33 gauge   .  levocetirizine (XYZAL) 5 MG tablet Take 1 tablet (5 mg total) by mouth every evening.   Marland Kitchen losartan (COZAAR) 25 MG tablet TAKE 1 TABLET EVERY DAY (Patient taking differently: Take 25 mg by mouth daily.)   . meloxicam (MOBIC) 7.5 MG tablet Mobic 7.5 mg tablet  Take 1 tablet twice a day by oral route.   . methocarbamol (ROBAXIN) 500 MG tablet Take 500 mg by mouth every 8 (eight) hours as needed for muscle spasms.   . Multiple Vitamins-Minerals (MULTIVITAMIN WITH MINERALS) tablet Take 1 tablet by mouth daily.   Marland Kitchen oxyCODONE-acetaminophen (PERCOCET) 10-325 MG tablet Take 1 tablet by mouth 5 (five) times daily.  11/25/2020: prn  . propranolol (INDERAL) 10 MG tablet TAKE 1 TABLET BY MOUTH THREE TIMES A DAY (Patient taking differently: Take 10 mg by mouth 2 (two) times daily.)   . sodium chloride (OCEAN) 0.65 % SOLN nasal spray Place 1 spray into both nostrils 2 (two) times daily as needed for congestion.   . [DISCONTINUED] atorvastatin (LIPITOR) 80 MG tablet Take 80 mg by mouth daily.   . [DISCONTINUED] lisinopril (ZESTRIL) 20 MG tablet Take 1 tablet by mouth daily.   .  [DISCONTINUED] SUMAtriptan (IMITREX) 50 MG tablet TAKE 1-2 TABLETS BY MOUTH AS NEEDED FOR MIGRAINE MAY REPEAT IN 2 HOURS IF NEEDED. MAX 4/24 HOURS   . Blood Glucose Monitoring Suppl (TRUE METRIX AIR GLUCOSE METER) DEVI True Metrix Glucose Meter (Patient not taking: Reported on 11/25/2020)   . esomeprazole (NEXIUM) 40 MG capsule Take 1 capsule (40 mg total) by mouth daily. Follow up in office for more refills   . [DISCONTINUED] Alcohol Swabs (B-D SINGLE USE SWABS REGULAR) PADS USE 2 (TWO) TIMES DAILY. (Patient not taking: Reported on 11/25/2020)    No facility-administered medications prior to visit.    Review of Systems  All other systems reviewed and are negative.      Objective    BP 124/82   Pulse 86   Temp 97.8 F (36.6 C) (Oral)   Resp 16   Ht 5' 3.5" (1.613 m)   Wt 184 lb (83.5 kg)   SpO2 97%   BMI 32.08 kg/m     Physical Exam Constitutional:      Appearance: Normal appearance.  Cardiovascular:     Rate and Rhythm: Normal rate and regular rhythm.     Heart sounds: Normal heart sounds.  Pulmonary:     Effort: Pulmonary effort is normal.     Breath sounds: Normal breath sounds.  Skin:    General: Skin is warm and dry.  Neurological:     Mental Status: She is alert and oriented to person, place, and time. Mental status is at baseline.  Psychiatric:        Mood and Affect: Mood normal.        Behavior: Behavior normal.       Results for orders placed or performed in visit on 11/25/20  Comprehensive Metabolic Panel (CMET)  Result Value Ref Range   Glucose, Bld 113 (H) 65 - 99 mg/dL   BUN 15 7 - 25 mg/dL   Creat 9.50 9.32 - 6.71 mg/dL   BUN/Creatinine Ratio NOT APPLICABLE 6 - 22 (calc)   Sodium 141 135 - 146 mmol/L   Potassium 4.4 3.5 - 5.3 mmol/L   Chloride 104 98 - 110 mmol/L   CO2 30 20 - 32 mmol/L   Calcium 9.8 8.6 - 10.4 mg/dL   Total Protein 7.2 6.1 - 8.1 g/dL  Albumin 4.4 3.6 - 5.1 g/dL   Globulin 2.8 1.9 - 3.7 g/dL (calc)   AG Ratio 1.6 1.0 - 2.5  (calc)   Total Bilirubin 0.4 0.2 - 1.2 mg/dL   Alkaline phosphatase (APISO) 117 37 - 153 U/L   AST 19 10 - 35 U/L   ALT 17 6 - 29 U/L  Lipid Profile  Result Value Ref Range   Cholesterol 238 (H) <200 mg/dL   HDL 61 > OR = 50 mg/dL   Triglycerides 940 <768 mg/dL   LDL Cholesterol (Calc) 154 (H) mg/dL (calc)   Total CHOL/HDL Ratio 3.9 <5.0 (calc)   Non-HDL Cholesterol (Calc) 177 (H) <130 mg/dL (calc)  POCT HgB G8U  Result Value Ref Range   Hemoglobin A1C 7.2 (A) 4.0 - 5.6 %   HbA1c POC (<> result, manual entry)     HbA1c, POC (prediabetic range)     HbA1c, POC (controlled diabetic range)      Assessment & Plan    1. 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 Requesting eye exam records.  On ACEi On Statin Discussed diet and exercise F/u in 9 months  - POCT HgB A1C - Comprehensive Metabolic Panel (CMET) - Lipid Profile  2. Essential (primary) hypertension  - Comprehensive Metabolic Panel (CMET) - Lipid Profile  3. Neutropenia, unspecified type (HCC)   4. 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HCC)   5. Angina pectoris (HCC)   6. Left ear pain   7. Rash   8. Chronic nonintractable headache, unspecified headache type  Change imitrex to maxalt.   - rizatriptan (MAXALT-MLT) 5 MG disintegrating tablet; Take at first sign of headache. May repeat in 2 hours if needed  Dispense: 10 tablet; Refill: 0   Return in about 4 months (around 03/27/2021) for chronic .         Trey Sailors, PA-C  [facility] [phone] (phone) [phone] (fax)  [facility]

## 2020-11-26 LAB — COMPREHENSIVE METABOLIC PANEL
AG Ratio: 1.6 (calc) (ref 1.0–2.5)
ALT: 17 U/L (ref 6–29)
AST: 19 U/L (ref 10–35)
Albumin: 4.4 g/dL (ref 3.6–5.1)
Alkaline phosphatase (APISO): 117 U/L (ref 37–153)
BUN: 15 mg/dL (ref 7–25)
CO2: 30 mmol/L (ref 20–32)
Calcium: 9.8 mg/dL (ref 8.6–10.4)
Chloride: 104 mmol/L (ref 98–110)
Creat: 0.64 mg/dL (ref 0.60–0.93)
Globulin: 2.8 g/dL (calc) (ref 1.9–3.7)
Glucose, Bld: 113 mg/dL — ABNORMAL HIGH (ref 65–99)
Potassium: 4.4 mmol/L (ref 3.5–5.3)
Sodium: 141 mmol/L (ref 135–146)
Total Bilirubin: 0.4 mg/dL (ref 0.2–1.2)
Total Protein: 7.2 g/dL (ref 6.1–8.1)

## 2020-11-26 LAB — LIPID PANEL
Cholesterol: 238 mg/dL — ABNORMAL HIGH (ref <200)
HDL: 61 mg/dL (ref >=50)
LDL Cholesterol (Calc): 154 mg/dL (calc) — ABNORMAL HIGH
Non-HDL Cholesterol (Calc): 177 mg/dL (calc) — ABNORMAL HIGH (ref <130)
Total CHOL/HDL Ratio: 3.9 (calc) (ref <5.0)
Triglycerides: 112 mg/dL (ref <150)

## 2020-11-26 NOTE — Progress Notes (Signed)
Erroneous encounter. Please disregard.

## 2020-11-27 ENCOUNTER — Telehealth: Payer: Self-pay

## 2020-11-27 MED ORDER — ROSUVASTATIN CALCIUM 40 MG PO TABS: 40.0000 mg | ORAL_TABLET | Freq: Every day | ORAL | 0 refills | Status: DC

## 2020-11-27 NOTE — Telephone Encounter (Signed)
-----   Message from Trey Sailors, [place] sent at 11/26/2020  1:36 PM EDT ----- Cholesterol above goal, but otherwise labs are stable. Recommend changing to crestor 40 mg QHS #90 if patient agreeable to switching for better cholesterol control, can send this in if pt OK. Can recheck labs at f/u.

## 2020-11-29 DIAGNOSIS — Z794 Long term (current) use of insulin: Secondary | ICD-10-CM

## 2020-11-29 DIAGNOSIS — R809 Proteinuria, unspecified: Secondary | ICD-10-CM

## 2020-11-29 DIAGNOSIS — E1329 Other specified diabetes mellitus with other diabetic kidney complication: Secondary | ICD-10-CM | POA: Diagnosis not present

## 2020-11-30 ENCOUNTER — Encounter: Payer: Self-pay | Admitting: Family Medicine

## 2020-12-03 ENCOUNTER — Ambulatory Visit (INDEPENDENT_AMBULATORY_CARE_PROVIDER_SITE_OTHER): Payer: Medicare HMO

## 2020-12-03 DIAGNOSIS — R809 Proteinuria, unspecified: Secondary | ICD-10-CM

## 2020-12-03 DIAGNOSIS — Z794 Long term (current) use of insulin: Secondary | ICD-10-CM | POA: Diagnosis not present

## 2020-12-03 DIAGNOSIS — E1329 Other specified diabetes mellitus with other diabetic kidney complication: Secondary | ICD-10-CM

## 2020-12-03 NOTE — Progress Notes (Signed)
Trulicity Patient Assistance form for [employer] completed by patient and faxed for review on 12/03/20   Lori Potts, CPP Clinical Pharmacist [facility] [phone]

## 2020-12-10 ENCOUNTER — Other Ambulatory Visit: Payer: Self-pay | Admitting: Family Medicine

## 2020-12-10 ENCOUNTER — Telehealth: Payer: Self-pay

## 2020-12-10 DIAGNOSIS — K219 Gastro-esophageal reflux disease without esophagitis: Secondary | ICD-10-CM

## 2020-12-10 NOTE — Progress Notes (Signed)
Chronic Care Management Pharmacy Assistant   Name: TAHIRAH SARA  MRN: [id number] DOB: Jul 16, 1946  Reason for Encounter:Diabetes Disease State Call/Patient assistance for Trulicity.    Recent office visits:  11/27/2020 Osvaldo Angst (PCP Office) recommend changing to Crestor 40 mg  11/25/2020 Osvaldo Angst (PCP office) Change imitrex to maxalt  Recent consult visits:  None recent Consult Visit  Hospital visits:  None in previous 6 months  Medications: Outpatient Encounter Medications as of 12/10/2020  Medication Sig Note  . albuterol (VENTOLIN HFA) 108 (90 Base) MCG/ACT inhaler Inhale 1 puff into the lungs every 6 (six) hours as needed for wheezing or shortness of breath.   Marland Kitchen ascorbic acid (VITAMIN C) 500 MG tablet Take 500 mg by mouth daily.   Marland Kitchen aspirin 81 MG EC tablet Take 1 tablet by mouth daily.   Marland Kitchen atorvastatin (LIPITOR) 80 MG tablet TAKE 1 TABLET EVERY DAY   . Blood Glucose Monitoring Suppl (TRUE METRIX AIR GLUCOSE METER) DEVI True Metrix Glucose Meter (Patient not taking: Reported on 11/25/2020)   . citalopram (CELEXA) 20 MG tablet TAKE 1 TABLET DAILY FOR NERVES, STRESS, ANXIETY SYMPTOMS (DOSE CHANGE)   . Dulaglutide (TRULICITY) 0.75 MG/0.5ML SOPN Inject 0.75 mg into the skin once a week. 09/03/2020: Tuesdays   . esomeprazole (NEXIUM) 40 MG capsule Take 1 capsule (40 mg total) by mouth daily. Follow up in office for more refills   . fluticasone furoate-vilanterol (BREO ELLIPTA) 100-25 MCG/INH AEPB Inhale 1 puff into the lungs daily.   Marland Kitchen ipratropium-albuterol (DUONEB) 0.5-2.5 (3) MG/3ML SOLN CAN TAKE EVERY 20 MINUTES FOR 2 DOSES AS NEEDED FOR WHEEZING (Patient taking differently: Inhale 3 mLs into the lungs every 6 (six) hours as needed (SOB).)   . Lancets 33G MISC TRUEplus Lancets 33 gauge   . levocetirizine (XYZAL) 5 MG tablet Take 1 tablet (5 mg total) by mouth every evening.   Marland Kitchen losartan (COZAAR) 25 MG tablet TAKE 1 TABLET EVERY DAY (Patient taking differently: Take  25 mg by mouth daily.)   . meloxicam (MOBIC) 7.5 MG tablet Mobic 7.5 mg tablet  Take 1 tablet twice a day by oral route.   . methocarbamol (ROBAXIN) 500 MG tablet Take 500 mg by mouth every 8 (eight) hours as needed for muscle spasms.   . Multiple Vitamins-Minerals (MULTIVITAMIN WITH MINERALS) tablet Take 1 tablet by mouth daily.   Marland Kitchen oxyCODONE-acetaminophen (PERCOCET) 10-325 MG tablet Take 1 tablet by mouth 5 (five) times daily.  11/25/2020: prn  . propranolol (INDERAL) 10 MG tablet TAKE 1 TABLET BY MOUTH THREE TIMES A DAY (Patient taking differently: Take 10 mg by mouth 2 (two) times daily.)   . rizatriptan (MAXALT-MLT) 5 MG disintegrating tablet Take at first sign of headache. May repeat in 2 hours if needed   . rosuvastatin (CRESTOR) 40 MG tablet Take 1 tablet (40 mg total) by mouth at bedtime.   . sodium chloride (OCEAN) 0.65 % SOLN nasal spray Place 1 spray into both nostrils 2 (two) times daily as needed for congestion.    No facility-administered encounter medications on file as of 12/10/2020.    Star Rating Drugs: Atorvastatin 80 mg last filled on 08/26/2020 for 90 day supply at [facility].(Patient states she is not taking Atorvastatin no more) Trulicity 0.75 mg last filled on 08/24/2020 for 84 day supply at [facility]. Losartan 25 mg last filled on 09/15/2020 for 90 day supply at [facility]. Rosuvastatin 40 mg last filled on 02/17/2020 for 90 day supply at [facility]. (  patient states she is going to get her rosuvastatin filled today or tomorrow,Patient states she had rosuvastatin 20 mg prior and has been doubling the dose.Patient denies any side effect or issue with Rosuvastatin.Patient denies receiving  rosuvastatin from a different pharmacy).  Recent Relevant Labs: Lab Results  Component Value Date/Time   HGBA1C 7.2 (A) 11/25/2020 08:04 AM   HGBA1C 7.2 (H) 11/14/2019 12:06 PM   HGBA1C 6.6 03/31/2019 12:00 AM   HGBA1C 6.3 (H) 05/09/2018 09:50 AM   MICROALBUR 5.2  05/14/2019 12:00 AM   MICROALBUR 3.8 05/09/2018 09:50 AM   MICROALBUR 20 07/04/2016 10:36 AM    Kidney Function Lab Results  Component Value Date/Time   CREATININE 0.64 11/25/2020 08:39 AM   CREATININE 0.64 09/14/2020 09:37 AM   CREATININE 0.66 02/18/2020 09:37 AM   GFRNONAA >60 09/14/2020 09:37 AM   GFRNONAA 87 02/18/2020 09:37 AM   GFRAA 101 02/18/2020 09:37 AM    . Current antihyperglycemic regimen:  ? Trulicity 0.75 mg weekly  . What recent interventions/DTPs have been made to improve glycemic control:  o Patient states she was approved for Trulicity for patient assistance from [facility]. . Have there been any recent hospitalizations or ED visits since last visit with CPP? No . Patient denies hypoglycemic symptoms, including Pale, Sweaty, Shaky, Hungry, Nervous/irritable and Vision changes . Patient reports hyperglycemic symptoms, including fatigue . How often are you checking your blood sugar? Infrequently . What are your blood sugars ranging?  o Fasting: N/A o Before meals: No dates were given , [phone] o After meals: N/A o Bedtime: N/A . During the week, how often does your blood glucose drop below 70? Never . Are you checking your feet daily/regularly?   Patient denies burning,numbness, pain or tingling sensations in her feet.  Adherence Review: Is the patient currently on a STATIN medication? Yes Is the patient currently on ACE/ARB medication? Yes Does the patient have >5 day gap between last estimated fill dates? Yes   Patient assistance status for Trulicity Patient states she is approved from [employer] to receive patient assistance for Trulicity.  Adherence issue with Rosuvastatin 40 mg :  Rosuvastatin 40 mg last filled on 02/17/2020 for [facility] day supply at [facility]. (Patient states she is going to get her rosuvastatin filled today or tomorrow,Patient states she had rosuvastatin 20 mg prior and has been doubling the dose.Patient denies any side effect  or issue with Rosuvastatin.Patient denies receiving  rosuvastatin from a different pharmacy).  [name] Clinical Pharmacist Assistant [phone]

## 2020-12-15 NOTE — Telephone Encounter (Signed)
Pt aware that prescription has been sent to pharmacy, but stated that she was just seen by Leighton Roach on 4.6.2022.

## 2020-12-16 ENCOUNTER — Ambulatory Visit (INDEPENDENT_AMBULATORY_CARE_PROVIDER_SITE_OTHER): Payer: Medicare HMO | Admitting: Family Medicine

## 2020-12-16 ENCOUNTER — Encounter: Payer: Self-pay | Admitting: Family Medicine

## 2020-12-16 ENCOUNTER — Other Ambulatory Visit: Payer: Self-pay

## 2020-12-16 DIAGNOSIS — J4521 Mild intermittent asthma with (acute) exacerbation: Secondary | ICD-10-CM | POA: Diagnosis not present

## 2020-12-16 MED ORDER — BENZONATATE 100 MG PO CAPS: 100.0000 mg | ORAL_CAPSULE | Freq: Two times a day (BID) | ORAL | 0 refills | Status: DC | PRN

## 2020-12-16 MED ORDER — MONTELUKAST SODIUM 10 MG PO TABS
10.0000 mg | ORAL_TABLET | Freq: Every day | ORAL | 0 refills | Status: DC
Start: 2020-12-16 — End: 2021-03-19

## 2020-12-16 MED ORDER — FLUTICASONE PROPIONATE 50 MCG/ACT NA SUSP: 2.0000 | Freq: Every evening | NASAL | 0 refills | Status: DC

## 2020-12-16 MED ORDER — AZELASTINE HCL 0.1 % NA SOLN: 1.0000 | Freq: Two times a day (BID) | NASAL | 0 refills | Status: DC

## 2020-12-16 NOTE — Progress Notes (Signed)
Name: Lori Potts   MRN: [id number]    DOB: May 16, 1946   Date:12/16/2020       Progress Note  Subjective  Chief Complaint  Chief Complaint  Patient presents with  . Sinusitis    Cough, congested, facial pressure for 1 week    I connected with  [name]  on 12/16/20 at  3:00 PM EDT by a telephone and verified that I am speaking with the correct person using two identifiers.  I discussed the limitations of evaluation and management by telemedicine and the availability of in person appointments. The patient expressed understanding and agreed to proceed with the virtual visit  Staff also discussed with the patient that there may be a patient responsible charge related to this service. Patient Location: at home Provider Location: [facility] Additional Individuals present: alone   HPI  URI: symptoms started after she cleaner her porch one week ago, initially has sneezing, over the past few days she noticed a dry cough, clear rhinorrhea and nasal congestion , she also has a headache / throbbing like between eye browns , having left ear pain that started about 20 days ago , throbbing like. Denies chills or fever. She has been taking her allergy medications . Appetite is good . No nausea or vomiting. She states being sick her glucose has been within normal limits.    COVID-19 vaccine she had 3 doses, last one in October.   No sick contacts.   Patient Active Problem List   Diagnosis Date Noted  . Neutropenia, unspecified type (HCC) 11/25/2020  . Intractable migraine with aura without status migrainosus 04/18/2020  . Body mass index (BMI) 31.0-31.9, adult 02/06/2020  . Chronic left hip pain 12/26/2019  . Lumbar radiculopathy 09/05/2019  . Diabetes mellitus with microalbuminuria (HCC) 12/03/2018  . Microalbuminuria 01/22/2018  . Anemia 09/22/2017  . S/P knee replacement 07/28/2017  . Allergic rhinitis 05/26/2017  . Vitamin D deficiency 05/19/2017  . Chronic nonintractable headache  05/18/2017  . Special screening for malignant neoplasms, colon   . Gastroesophageal reflux disease   . Gastritis without bleeding   . 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 Insomnia 11/29/2016  . 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 Vasomotor flushing 04/19/2016  . Abdominal wall pain in right flank 03/29/2016  . Diverticulosis 02/24/2016  . Hypercholesterolemia 06/16/2015  . FHx: migraine headaches 04/13/2015  . Chronic pain 04/13/2015  . Neck pain 02/26/2015  . Status post lumbar surgery 02/26/2015  . Abnormal ECG 01/27/2015  . Angina pectoris (HCC) 01/27/2015  . 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) 01/27/2015  . Essential (primary) hypertension 01/27/2015    Past Surgical History:  Procedure Laterality Date  . ABDOMINAL HYSTERECTOMY    . ANTERIOR CERVICAL DECOMP/DISCECTOMY FUSION N/A 02/26/2015   Procedure: ANTERIOR CERVICAL DISCECTOMY FUSION C4-5 (1 LEVEL);  Surgeon: Venita Lick, MD;  Location: [facility] OR;  Service: Orthopedics;  Laterality: N/A;  . ANTERIOR LAT LUMBAR FUSION N/A 03/21/2013   Procedure: ANTERIOR LATERAL LUMBAR FUSION 1 LEVEL/ XLIF L3-L4 ;  Surgeon: Venita Lick, MD;  Location: [facility] OR;  Service: Orthopedics;  Laterality: N/A;  . APPENDECTOMY    . AUGMENTATION MAMMAPLASTY Bilateral 1978  . BACK SURGERY    . BACK SURGERY     Lumbar fusion x 2  . BREAST EXCISIONAL BIOPSY Right 1970  . BREAST SURGERY  1990   Augementation  . CARDIAC CATHETERIZATION  5/12   ef 55%  . CARDIAC  CATHETERIZATION  10/2010   [facility]; EF 55%  . CARDIAC CATHETERIZATION Left 02/16/2016   Procedure: Left Heart Cath and Coronary Angiography;  Surgeon: Alwyn Peawayne D Callwood, MD;  Location: [facility] INVASIVE CV LAB;  Service: Cardiovascular;  Laterality: Left;  . COLONOSCOPY    . COLONOSCOPY WITH PROPOFOL N/A 04/10/2017   Procedure: COLONOSCOPY WITH PROPOFOL;  Surgeon: Midge MiniumWohl, Darren, MD;  [facility];  Service:  Gastroenterology;  Laterality: N/A;  Diabetic - insulin  . ESOPHAGOGASTRODUODENOSCOPY    . JOINT REPLACEMENT    . KIDNEY SURGERY  1998   growth removed from left kidney   . LEFT HEART CATH AND CORONARY ANGIOGRAPHY Left 09/10/2018   Procedure: LEFT HEART CATH AND CORONARY ANGIOGRAPHY;  Surgeon: Alwyn Peaallwood, Dwayne D, MD;  Location: [facility] INVASIVE CV LAB;  Service: Cardiovascular;  Laterality: Left;  . pain stimulator    . POSTERIOR CERVICAL FUSION/FORAMINOTOMY Right 03/21/2013   Procedure: POSTERIOR L2-3 RIGHT FORAMINOTOMY;  Surgeon: Venita Lickahari Brooks, MD;  Location: [facility] OR;  Service: Orthopedics;  Laterality: Right;  . REVISION OF SCAR TISSUE RECTUS MUSCLE    . SHOULDER ARTHROSCOPY WITH ROTATOR CUFF REPAIR AND SUBACROMIAL DECOMPRESSION Right 09/17/2020   Procedure: RIGHT SHOULDER ARTHROSCOPY WITH MINI-OPEN ROTATOR CUFF REPAIR, DISTAL CLAVICLE EXCISION, AND SUBACROMIAL DECOMPRESSION WITH BICEP TENDONESIS;  Surgeon: Juanell FairlyKrasinski, Kevin, MD;  Location: [facility] ORS;  Service: Orthopedics;  Laterality: Right;  . SMALL BOWEL REPAIR    . SPINAL CORD STIMULATOR BATTERY EXCHANGE N/A 10/17/2012   Procedure: SPINAL CORD STIMULATOR BATTERY REMOVAL;  Surgeon: Venita Lickahari Brooks, MD;  Location: [facility] OR;  Service: Orthopedics;  Laterality: N/A;  . SPINAL CORD STIMULATOR BATTERY EXCHANGE N/A 07/22/2015   Procedure: REIMPLANTATION OF SPINAL CORD STIMULATOR BATTERY ;  Surgeon: Venita Lickahari Brooks, MD;  Location: [facility] OR;  Service: Orthopedics;  Laterality: N/A;  . TOTAL KNEE ARTHROPLASTY Left   . TOTAL KNEE ARTHROPLASTY Right 07/28/2017   Procedure: RIGHT TOTAL KNEE ARTHROPLASTY;  Surgeon: Eugenia Mcalpineollins, Robert, MD;  Location: [facility] ORS;  Service: Orthopedics;  Laterality: Right;    Family History  Problem Relation Age of Onset  . Heart attack Mother   . Sarcoidosis Sister     Social History   Socioeconomic History  . Marital status: Married    Spouse name: Chrissie NoaWilliam  . Number of children: 3  . Years of education: Not on file  . Highest  education level: Associate degree: occupational, Scientist, product/process developmenttechnical, or vocational program  Occupational History  . Not on file  Tobacco Use  . Smoking status: Former Smoker    Types: Cigarettes    Quit date: 2006    Years since quitting: 16.3  . Smokeless tobacco: Never Used  . Tobacco comment: quit smoking 91yrs ago  Vaping Use  . Vaping Use: Never used  Substance and Sexual Activity  . Alcohol use: No  . Drug use: No  . Sexual activity: Yes    Partners: Male    Birth control/protection: Surgical  Other Topics Concern  . Not on file  Social History Narrative  . Not on file   Social Determinants of Health   Financial Resource Strain: Low Risk   . Difficulty of Paying Living Expenses: Not very hard  Food Insecurity: No Food Insecurity  . Worried About Programme researcher, broadcasting/film/videounning Out of Food in the Last Year: Never true  . Ran Out of Food in the Last Year: Never true  Transportation Needs: No Transportation Needs  . Lack of Transportation (Medical): No  . Lack of Transportation (Non-Medical): No  Physical Activity: Inactive  .  Days of Exercise per Week: 0 days  . Minutes of Exercise per Session: 0 min  Stress: No Stress Concern Present  . Feeling of Stress : Not at all  Social Connections: Socially Integrated  . Frequency of Communication with Friends and Family: More than three times a week  . Frequency of Social Gatherings with Friends and Family: Twice a week  . Attends Religious Services: More than 4 times per year  . Active Member of Clubs or Organizations: Yes  . Attends Banker Meetings: 1 to 4 times per year  . Marital Status: Married  Catering manager Violence: Not At Risk  . Fear of Current or Ex-Partner: No  . Emotionally Abused: No  . Physically Abused: No  . Sexually Abused: No     Current Outpatient Medications:  .  albuterol (VENTOLIN HFA) 108 (90 Base) MCG/ACT inhaler, Inhale 1 puff into the lungs every 6 (six) hours as needed for wheezing or shortness of breath.,  Disp: 18 g, Rfl: 2 .  ascorbic acid (VITAMIN C) 500 MG tablet, Take 500 mg by mouth daily., Disp: , Rfl:  .  aspirin 81 MG EC tablet, Take 1 tablet by mouth daily., Disp: , Rfl:  .  atorvastatin (LIPITOR) 80 MG tablet, TAKE 1 TABLET EVERY DAY, Disp: 90 tablet, Rfl: 0 .  citalopram (CELEXA) 20 MG tablet, TAKE 1 TABLET DAILY FOR NERVES, STRESS, ANXIETY SYMPTOMS (DOSE CHANGE), Disp: 90 tablet, Rfl: 0 .  Dulaglutide (TRULICITY) 0.75 MG/0.5ML SOPN, Inject 0.75 mg into the skin once a week., Disp: 6 mL, Rfl: 1 .  esomeprazole (NEXIUM) 40 MG capsule, Take 1 capsule (40 mg total) by mouth daily. Follow up in office for more refills, Disp: 30 capsule, Rfl: 0 .  fluticasone furoate-vilanterol (BREO ELLIPTA) 100-25 MCG/INH AEPB, Inhale 1 puff into the lungs daily., Disp: , Rfl:  .  ipratropium-albuterol (DUONEB) 0.5-2.5 (3) MG/3ML SOLN, CAN TAKE EVERY 20 MINUTES FOR 2 DOSES AS NEEDED FOR WHEEZING (Patient taking differently: Inhale 3 mLs into the lungs every 6 (six) hours as needed (SOB).), Disp: 90 mL, Rfl: 0 .  levocetirizine (XYZAL) 5 MG tablet, Take 1 tablet (5 mg total) by mouth every evening., Disp: 30 tablet, Rfl: 2 .  losartan (COZAAR) 25 MG tablet, TAKE 1 TABLET EVERY DAY (Patient taking differently: Take 25 mg by mouth daily.), Disp: 90 tablet, Rfl: 3 .  meloxicam (MOBIC) 7.5 MG tablet, Mobic 7.5 mg tablet  Take 1 tablet twice a day by oral route., Disp: , Rfl:  .  methocarbamol (ROBAXIN) 500 MG tablet, Take 500 mg by mouth every 8 (eight) hours as needed for muscle spasms., Disp: , Rfl:  .  Multiple Vitamins-Minerals (MULTIVITAMIN WITH MINERALS) tablet, Take 1 tablet by mouth daily., Disp: , Rfl:  .  oxyCODONE-acetaminophen (PERCOCET) 10-325 MG tablet, Take 1 tablet by mouth 5 (five) times daily. , Disp: , Rfl: 0 .  propranolol (INDERAL) 10 MG tablet, TAKE 1 TABLET BY MOUTH THREE TIMES A DAY (Patient taking differently: Take 10 mg by mouth 2 (two) times daily.), Disp: 270 tablet, Rfl: 3 .   rizatriptan (MAXALT-MLT) 5 MG disintegrating tablet, Take at first sign of headache. May repeat in 2 hours if needed, Disp: 10 tablet, Rfl: 0 .  rosuvastatin (CRESTOR) 40 MG tablet, Take 1 tablet (40 mg total) by mouth at bedtime., Disp: 90 tablet, Rfl: 0 .  sodium chloride (OCEAN) 0.65 % SOLN nasal spray, Place 1 spray into both nostrils 2 (two) times daily  as needed for congestion., Disp: 60 mL, Rfl: 5 .  Blood Glucose Monitoring Suppl (TRUE METRIX AIR GLUCOSE METER) DEVI, True Metrix Glucose Meter (Patient not taking: No sig reported), Disp: , Rfl:  .  Lancets 33G MISC, TRUEplus Lancets 33 gauge (Patient not taking: Reported on 12/16/2020), Disp: , Rfl:   Allergies  Allergen Reactions  . Metformin And Related Diarrhea  . Adhesive [Tape] Rash and Other (See Comments)    Regular tape is ok, allergy is to paper tape    I personally reviewed active problem list, medication list, allergies, family history, social history, health maintenance with the patient/caregiver today.   ROS  Ten systems reviewed and is negative except as mentioned in HPI   Objective  Virtual encounter, vitals not obtained.  There is no height or weight on file to calculate BMI.  Physical Exam  Dry cough, speaking in full sentences  PHQ2/9: Depression screen [facility] 2/9 12/16/2020 11/25/2020 11/20/2020 11/05/2020 03/25/2020  Decreased Interest 0 0 1 0 0  Down, Depressed, Hopeless 0 0 1 0 0  PHQ - 2 Score 0 0 2 0 0  Altered sleeping 0 0 1 - 0  Tired, decreased energy 0 0 1 - 0  Change in appetite 0 0 0 - 0  Feeling bad or failure about yourself  0 0 0 - 0  Trouble concentrating 0 0 0 - 0  Moving slowly or fidgety/restless 0 0 0 - 0  Suicidal thoughts 0 0 0 - 0  PHQ-9 Score 0 0 4 - 0  Difficult doing work/chores Not difficult at all Not difficult at all Not difficult at all - Not difficult at all  Some recent data might be hidden   PHQ-2/9 Result is negative.    Fall Risk: Fall Risk  12/16/2020 11/25/2020 11/20/2020  11/05/2020 03/25/2020  Falls in the past year? 1 1 1 1  0  Comment - - - - -  Number falls in past yr: 1 1 1 1  0  Injury with Fall? 1 1 0 0 0  Risk for fall due to : History of fall(s) History of fall(s) Impaired balance/gait;History of fall(s);Impaired mobility History of fall(s) -  Follow up Falls evaluation completed Falls evaluation completed - Falls prevention discussed -     Assessment & Plan  1. Mild intermittent reactive airway disease with acute exacerbation  Continue Breo and ventolin, we will hold off on adding prednisone due to DM, but advised her to call me back if no improvement of symptoms  - montelukast (SINGULAIR) 10 MG tablet; Take 1 tablet (10 mg total) by mouth at bedtime.  Dispense: 90 tablet; Refill: 0 - benzonatate (TESSALON) 100 MG capsule; Take 1-2 capsules (100-200 mg total) by mouth 2 (two) times daily as needed.  Dispense: 60 capsule; Refill: 0  2. Allergic rhinitis with mild intermittent asthma without status asthmaticus, with acute exacerbation  It may improve with ear fullness, likely eustachian tube dysfunction  - azelastine (ASTELIN) 0.1 % nasal spray; Place 1 spray into both nostrils 2 (two) times daily. Use in each nostril as directed  Dispense: 30 mL; Refill: 0 - fluticasone (FLONASE) 50 MCG/ACT nasal spray; Place 2 sprays into both nostrils every evening.  Dispense: 16 g; Refill: 0  I discussed the assessment and treatment plan with the patient. The patient was provided an opportunity to ask questions and all were answered. The patient agreed with the plan and demonstrated an understanding of the instructions.  The patient was advised to call  back or seek an in-person evaluation if the symptoms worsen or if the condition fails to improve as anticipated.  I provided 15  minutes of non-face-to-face time during this encounter.

## 2020-12-21 ENCOUNTER — Other Ambulatory Visit: Payer: Self-pay | Admitting: Family Medicine

## 2020-12-21 ENCOUNTER — Telehealth: Payer: Self-pay

## 2020-12-21 DIAGNOSIS — R059 Cough, unspecified: Secondary | ICD-10-CM

## 2020-12-21 NOTE — Telephone Encounter (Signed)
Pt states that the tessalon pearls is not working, still have cough, congestion please call in something different. Pt was seen last week

## 2020-12-22 ENCOUNTER — Other Ambulatory Visit: Payer: Self-pay | Admitting: Family Medicine

## 2020-12-22 DIAGNOSIS — J4521 Mild intermittent asthma with (acute) exacerbation: Secondary | ICD-10-CM

## 2020-12-22 DIAGNOSIS — K219 Gastro-esophageal reflux disease without esophagitis: Secondary | ICD-10-CM

## 2020-12-22 MED ORDER — HYDROCOD POLST-CPM POLST ER 10-8 MG/5ML PO SUER: 5.0000 mL | Freq: Two times a day (BID) | ORAL | 0 refills | Status: DC | PRN

## 2020-12-22 NOTE — Telephone Encounter (Signed)
Pharmacy requested changes to original Rx:  Pharmacy request changes to Rx written for acute diagnosis. Request 90 days Rx.

## 2020-12-28 NOTE — Telephone Encounter (Signed)
Pt aware that script has been sent to pharmacy.

## 2020-12-29 ENCOUNTER — Other Ambulatory Visit: Payer: Self-pay

## 2020-12-29 ENCOUNTER — Telehealth (INDEPENDENT_AMBULATORY_CARE_PROVIDER_SITE_OTHER): Payer: Medicare HMO | Admitting: Family Medicine

## 2020-12-29 ENCOUNTER — Encounter: Payer: Self-pay | Admitting: Family Medicine

## 2020-12-29 VITALS — BP 130/72 | HR 72 | Temp 98.1°F | Resp 16 | Wt 183.6 lb

## 2020-12-29 DIAGNOSIS — M545 Low back pain, unspecified: Secondary | ICD-10-CM | POA: Diagnosis not present

## 2020-12-29 DIAGNOSIS — Z87440 Personal history of urinary (tract) infections: Secondary | ICD-10-CM

## 2020-12-29 DIAGNOSIS — Z794 Long term (current) use of insulin: Secondary | ICD-10-CM | POA: Diagnosis not present

## 2020-12-29 DIAGNOSIS — R319 Hematuria, unspecified: Secondary | ICD-10-CM

## 2020-12-29 DIAGNOSIS — R109 Unspecified abdominal pain: Secondary | ICD-10-CM | POA: Diagnosis not present

## 2020-12-29 DIAGNOSIS — R809 Proteinuria, unspecified: Secondary | ICD-10-CM | POA: Diagnosis not present

## 2020-12-29 DIAGNOSIS — N390 Urinary tract infection, site not specified: Secondary | ICD-10-CM

## 2020-12-29 DIAGNOSIS — E1329 Other specified diabetes mellitus with other diabetic kidney complication: Secondary | ICD-10-CM

## 2020-12-29 DIAGNOSIS — M549 Dorsalgia, unspecified: Secondary | ICD-10-CM | POA: Diagnosis not present

## 2020-12-29 LAB — POCT URINALYSIS DIPSTICK
Bilirubin, UA: NEGATIVE
Glucose, UA: NEGATIVE
Ketones, UA: NEGATIVE
Nitrite, UA: NEGATIVE
Odor: NEGATIVE
Protein, UA: NEGATIVE
Spec Grav, UA: 1.02 (ref 1.010–1.025)
Urobilinogen, UA: 0.2 E.U./dL
pH, UA: 5 (ref 5.0–8.0)

## 2020-12-29 MED ORDER — CEPHALEXIN 500 MG PO CAPS
500.0000 mg | ORAL_CAPSULE | Freq: Three times a day (TID) | ORAL | 0 refills | Status: AC
Start: 2020-12-29 — End: 2021-01-05

## 2020-12-29 NOTE — Progress Notes (Signed)
Name: Lori Potts   MRN: [id number]    DOB: 11-May-1946   Date:12/29/2020       Progress Note  Subjective:    Chief Complaint  Chief Complaint  Patient presents with  . Back Pain    X 4days  . Urinary Tract Infection    I connected with  [name] on 12/29/20 at  3:20 PM EDT by telephone and verified that I am speaking with the correct person using two identifiers.   I discussed the limitations, risks, security and privacy concerns of performing an evaluation and management service by telephone and the availability of in person appointments. Staff also discussed with the patient that there may be a patient responsible charge related to this service.  Patient verbalized understanding and agreed to proceed with encounter. Patient Location: home Provider Location: [facility] Additional Individuals present: none  4 d left side and left back flank pain achy, sore, intermittent feels similar to past UTI's with associated urinary urgency and frequency, gradually worsening  Urinary Tract Infection  This is a recurrent problem. The current episode started in the past 7 days. The problem occurs intermittently. The problem has been gradually worsening. The quality of the pain is described as aching. The pain is at a severity of 8/10. There has been no fever. There is a history of pyelonephritis. Associated symptoms include flank pain, frequency and urgency. Pertinent negatives include no chills, discharge, hematuria, hesitancy, nausea, possible pregnancy, sweats or vomiting. She has tried nothing for the symptoms. The treatment provided no relief. Her past medical history is significant for recurrent UTIs.   Hx of UTI - last was 10/2019 - micro reviewed Hx in the past of hospitalization and pyelo with "abscess on her kidney" feels similar to past infections Denies N, V, fever, abd pain, decreased appetite, hematuria, vaginal sx  T2DM well controlled with trulicity - last f/up was last  month Lab Results  Component Value Date   HGBA1C 7.2 (A) 11/25/2020     Patient Active Problem List   Diagnosis Date Noted  . Neutropenia, unspecified type (HCC) 11/25/2020  . Intractable migraine with aura without status migrainosus 04/18/2020  . Body mass index (BMI) 31.0-31.9, adult 02/06/2020  . Chronic left hip pain 12/26/2019  . Lumbar radiculopathy 09/05/2019  . Diabetes mellitus with microalbuminuria (HCC) 12/03/2018  . Microalbuminuria 01/22/2018  . Anemia 09/22/2017  . S/P knee replacement 07/28/2017  . Allergic rhinitis 05/26/2017  . Vitamin D deficiency 05/19/2017  . Chronic nonintractable headache 05/18/2017  . Special screening for malignant neoplasms, colon   . Gastroesophageal reflux disease   . Gastritis without bleeding   . 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 Insomnia 11/29/2016  . 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 Vasomotor flushing 04/19/2016  . Abdominal wall pain in right flank 03/29/2016  . Diverticulosis 02/24/2016  . Hypercholesterolemia 06/16/2015  . FHx: migraine headaches 04/13/2015  . Chronic pain 04/13/2015  . Neck pain 02/26/2015  . Status post lumbar surgery 02/26/2015  . Abnormal ECG 01/27/2015  . Angina pectoris (HCC) 01/27/2015  . 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) 01/27/2015  . Essential (primary) hypertension 01/27/2015    Social History   Tobacco Use  . Smoking status: Former Smoker    Types: Cigarettes    Quit date: 2006    Years since quitting: 16.3  . Smokeless tobacco: Never Used  . Tobacco comment: quit smoking 68yrs ago  Substance Use  Topics  . Alcohol use: No     Current Outpatient Medications:  .  albuterol (VENTOLIN HFA) 108 (90 Base) MCG/ACT inhaler, Inhale 1 puff into the lungs every 6 (six) hours as needed for wheezing or shortness of breath., Disp: 18 g, Rfl: 2 .  ascorbic acid (VITAMIN C) 500 MG tablet, Take 500 mg by mouth  daily., Disp: , Rfl:  .  aspirin 81 MG EC tablet, Take 1 tablet by mouth daily., Disp: , Rfl:  .  atorvastatin (LIPITOR) 80 MG tablet, TAKE 1 TABLET EVERY DAY, Disp: 90 tablet, Rfl: 0 .  azelastine (ASTELIN) 0.1 % nasal spray, PLACE 1 SPRAY INTO BOTH NOSTRILS 2 (TWO) TIMES DAILY. USE IN EACH NOSTRIL AS DIRECTED, Disp: 90 mL, Rfl: 1 .  benzonatate (TESSALON) 100 MG capsule, Take 1-2 capsules (100-200 mg total) by mouth 2 (two) times daily as needed., Disp: 60 capsule, Rfl: 0 .  Blood Glucose Monitoring Suppl (TRUE METRIX AIR GLUCOSE METER) DEVI, True Metrix Glucose Meter, Disp: , Rfl:  .  chlorpheniramine-HYDROcodone (TUSSIONEX PENNKINETIC ER) 10-8 MG/5ML SUER, Take 5 mLs by mouth every 12 (twelve) hours as needed., Disp: 140 mL, Rfl: 0 .  citalopram (CELEXA) 20 MG tablet, TAKE 1 TABLET DAILY FOR NERVES, STRESS, ANXIETY SYMPTOMS (DOSE CHANGE), Disp: 90 tablet, Rfl: 0 .  Dulaglutide (TRULICITY) 0.75 MG/0.5ML SOPN, Inject 0.75 mg into the skin once a week., Disp: 6 mL, Rfl: 1 .  esomeprazole (NEXIUM) 40 MG capsule, TAKE 1 CAPSULE (40 MG TOTAL) BY MOUTH DAILY. FOLLOW UP IN OFFICE FOR MORE REFILLS, Disp: 30 capsule, Rfl: 3 .  fluticasone (FLONASE) 50 MCG/ACT nasal spray, PLACE 2 SPRAYS INTO BOTH NOSTRILS EVERY EVENING., Disp: 48 mL, Rfl: 1 .  fluticasone furoate-vilanterol (BREO ELLIPTA) 100-25 MCG/INH AEPB, Inhale 1 puff into the lungs daily., Disp: , Rfl:  .  ipratropium-albuterol (DUONEB) 0.5-2.5 (3) MG/3ML SOLN, CAN TAKE EVERY 20 MINUTES FOR 2 DOSES AS NEEDED FOR WHEEZING (Patient taking differently: Inhale 3 mLs into the lungs every 6 (six) hours as needed (SOB).), Disp: 90 mL, Rfl: 0 .  Lancets 33G MISC, , Disp: , Rfl:  .  levocetirizine (XYZAL) 5 MG tablet, Take 1 tablet (5 mg total) by mouth every evening., Disp: 30 tablet, Rfl: 2 .  losartan (COZAAR) 25 MG tablet, TAKE 1 TABLET EVERY DAY (Patient taking differently: Take 25 mg by mouth daily.), Disp: 90 tablet, Rfl: 3 .  meloxicam (MOBIC) 7.5  MG tablet, Mobic 7.5 mg tablet  Take 1 tablet twice a day by oral route., Disp: , Rfl:  .  methocarbamol (ROBAXIN) 500 MG tablet, Take 500 mg by mouth every 8 (eight) hours as needed for muscle spasms., Disp: , Rfl:  .  montelukast (SINGULAIR) 10 MG tablet, Take 1 tablet (10 mg total) by mouth at bedtime., Disp: 90 tablet, Rfl: 0 .  Multiple Vitamins-Minerals (MULTIVITAMIN WITH MINERALS) tablet, Take 1 tablet by mouth daily., Disp: , Rfl:  .  oxyCODONE-acetaminophen (PERCOCET) 10-325 MG tablet, Take 1 tablet by mouth 5 (five) times daily. , Disp: , Rfl: 0 .  propranolol (INDERAL) 10 MG tablet, TAKE 1 TABLET BY MOUTH THREE TIMES A DAY (Patient taking differently: Take 10 mg by mouth 2 (two) times daily.), Disp: 270 tablet, Rfl: 3 .  rizatriptan (MAXALT-MLT) 5 MG disintegrating tablet, Take at first sign of headache. May repeat in 2 hours if needed, Disp: 10 tablet, Rfl: 0 .  rosuvastatin (CRESTOR) 40 MG tablet, Take 1 tablet (40 mg total) by mouth  at bedtime., Disp: 90 tablet, Rfl: 0 .  sodium chloride (OCEAN) 0.65 % SOLN nasal spray, Place 1 spray into both nostrils 2 (two) times daily as needed for congestion., Disp: 60 mL, Rfl: 5  Allergies  Allergen Reactions  . Metformin And Related Diarrhea  . Adhesive [Tape] Rash and Other (See Comments)    Regular tape is ok, allergy is to paper tape    Chart Review: I personally reviewed active problem list, medication list, allergies, family history, social history, health maintenance, notes from last encounter, lab results, imaging with the patient/caregiver today. Reviewed past OV and hospitalizations, reviewed micro - all personally reviewed by me today  5 min of review  Review of Systems  Constitutional: Negative.  Negative for activity change, appetite change, chills, diaphoresis, fatigue and fever.  HENT: Negative.   Eyes: Negative.   Respiratory: Negative.   Cardiovascular: Negative.   Gastrointestinal: Negative.  Negative for abdominal  pain, nausea and vomiting.  Endocrine: Negative.   Genitourinary: Positive for flank pain, frequency and urgency. Negative for decreased urine volume, difficulty urinating, dysuria, hematuria, hesitancy, pelvic pain, vaginal bleeding, vaginal discharge and vaginal pain.  Skin: Negative.   Allergic/Immunologic: Negative.   Neurological: Negative.   Hematological: Negative.   Psychiatric/Behavioral: Negative.   All other systems reviewed and are negative.    Objective:    Virtual encounter, vitals limited, only able to obtain the following Today's Vitals   12/29/20 1319  BP: 130/72  Pulse: 72  Resp: 16  Temp: 98.1 F (36.7 C)  SpO2: 97%  Weight: 183 lb 9.6 oz (83.3 kg)   Body mass index is 32.01 kg/m. Nursing Note and Vital Signs reviewed.  Physical Exam Vitals and nursing note reviewed.  Constitutional:      General: She is not in acute distress.    Appearance: Normal appearance. She is not ill-appearing, toxic-appearing or diaphoretic.  HENT:     Head: Normocephalic and atraumatic.  Cardiovascular:     Rate and Rhythm: Normal rate.  Pulmonary:     Effort: No respiratory distress.  Skin:    General: Skin is warm and dry.     Coloration: Skin is not jaundiced or pale.  Neurological:     Mental Status: She is alert. Mental status is at baseline.     Gait: Gait normal.  Psychiatric:        Mood and Affect: Mood normal.        Behavior: Behavior normal.     PE limited by telephone encounter Pt did come in earlier today to do urine sample and I did personally see pt and observed her walking out of clinic - she appeared well, normal gait, non-toxic, alert no respiratory distress  Results for orders placed or performed in visit on 12/29/20 (from the past 72 hour(s))  POCT urinalysis dipstick     Status: Abnormal   Collection Time: 12/29/20  1:26 PM  Result Value Ref Range   Color, UA yellow    Clarity, UA clear    Glucose, UA Negative Negative   Bilirubin, UA  neg    Ketones, UA neg    Spec Grav, UA 1.020 1.010 - 1.025   Blood, UA large    pH, UA 5.0 5.0 - 8.0   Protein, UA Negative Negative   Urobilinogen, UA 0.2 0.2 or 1.0 E.U./dL   Nitrite, UA neg    Leukocytes, UA Large (3+) (A) Negative   Appearance clear    Odor neg  Assessment and Plan:     ICD-10-CM   1. Urinary tract infection with hematuria, site unspecified  N39.0 POCT urinalysis dipstick   R31.9 Urine Culture    cephALEXin (KEFLEX) 500 MG capsule   left side and left flank and back pain with urinary frequency and urgency, hx of UTI, feels similar to past infections, dip + blood and leuks  2. Left flank pain  R10.9 POCT urinalysis dipstick    Urine Culture    cephALEXin (KEFLEX) 500 MG capsule   no N, V, fever - suspect for now simply UTI - empiric abx tx for 5 days can take up to 7 days, push fluids, reviewed red flags that warrant UC or ER f/up  3. History of recurrent UTI (urinary tract infection)  Z87.440 POCT urinalysis dipstick    Urine Culture   slightly higher dose of keflex with pts hx of UTI and past hospitalization with complicated UTI/pyelo  4. Controlled diabetes mellitus of other type with microalbuminuria, with long-term current use of insulin (HCC)  E13.29 Urine Culture   R80.9    Z79.4    well controlled, reviewed last labs, increases risk of UTI or complication, but overall A1C at goal - 7.2, sugars not elevated since feeling unwell    -Red flags and when to present for emergency care or RTC including but not limited to new/worsening/un-resolving symptoms,  reviewed with patient at time of visit. Follow up and care instructions discussed and provided in AVS. - I discussed the assessment and treatment plan with the patient. The patient was provided an opportunity to ask questions and all were answered. The patient agreed with the plan and demonstrated an understanding of the instructions.  - The patient was advised to call back or seek an in-person  evaluation if the symptoms worsen or if the condition fails to improve as anticipated.  I provided 21 minutes of non-face-to-face time during this encounter. 10 min on call with patient - obtaining history and physical, discussing and educating pt on treatment plan 5 min chart review - as specified above  6 min chart documentation - documentation in EMR, and coordinating care and treatment plan    Danelle Berry, PA-C 12/29/20 4:11 PM

## 2020-12-30 LAB — URINE CULTURE
MICRO NUMBER:: 11872204
SPECIMEN QUALITY:: ADEQUATE

## 2021-01-16 ENCOUNTER — Other Ambulatory Visit: Payer: Self-pay

## 2021-01-16 ENCOUNTER — Ambulatory Visit
Admission: EM | Admit: 2021-01-16 | Discharge: 2021-01-16 | Disposition: A | Discharge status: Home / Self Care | Payer: Medicare HMO | Attending: Physician Assistant | Admitting: Physician Assistant

## 2021-01-16 ENCOUNTER — Encounter: Payer: Self-pay | Admitting: Emergency Medicine

## 2021-01-16 DIAGNOSIS — Z20822 Contact with and (suspected) exposure to covid-19: Secondary | ICD-10-CM | POA: Diagnosis not present

## 2021-01-16 DIAGNOSIS — J111 Influenza due to unidentified influenza virus with other respiratory manifestations: Secondary | ICD-10-CM | POA: Insufficient documentation

## 2021-01-16 DIAGNOSIS — R0981 Nasal congestion: Secondary | ICD-10-CM | POA: Insufficient documentation

## 2021-01-16 DIAGNOSIS — J989 Respiratory disorder, unspecified: Secondary | ICD-10-CM | POA: Diagnosis not present

## 2021-01-16 DIAGNOSIS — R509 Fever, unspecified: Secondary | ICD-10-CM | POA: Diagnosis not present

## 2021-01-16 DIAGNOSIS — Z87891 Personal history of nicotine dependence: Secondary | ICD-10-CM | POA: Insufficient documentation

## 2021-01-16 DIAGNOSIS — R059 Cough, unspecified: Secondary | ICD-10-CM | POA: Insufficient documentation

## 2021-01-16 LAB — RESP PANEL BY RT-PCR (FLU A&B, COVID) ARPGX2
Influenza A by PCR: NEGATIVE
Influenza B by PCR: NEGATIVE
SARS Coronavirus 2 by RT PCR: NEGATIVE

## 2021-01-16 MED ORDER — OSELTAMIVIR PHOSPHATE 75 MG PO CAPS: 75.0000 mg | ORAL_CAPSULE | Freq: Two times a day (BID) | ORAL | 0 refills | Status: AC

## 2021-01-16 MED ORDER — AZITHROMYCIN 250 MG PO TABS: 250.0000 mg | ORAL_TABLET | Freq: Every day | ORAL | 0 refills | Status: DC

## 2021-01-16 MED ORDER — IBUPROFEN 600 MG PO TABS
600.0000 mg | ORAL_TABLET | Freq: Once | ORAL | Status: AC
  Administered 2021-01-16: 600 mg via ORAL

## 2021-01-16 NOTE — Discharge Instructions (Signed)
You are negative for influenza as well as COVID-19.  I suspect you still could have influenza or an influenza-like viral illness.  I have sent Tamiflu for you to start.  You may alternate Tylenol and ibuprofen as needed for the fever.  Increase rest and fluids.  Can take over-the-counter Robitussin as needed for cough.  You have declined a chest x-ray today but if you feel like your cough is becoming productive or worse, your fever continues after the next 2 to 3 days or you have chest pain or breathing difficulty you should be seen again immediately.  For any severe symptoms, please go to emergency department.  It is still possible you have pneumonia although based on your presentation I have a lower suspicion for that.  If you develop any other new/significant symptom that points in another direction you should be seen again.  If you become short of breath, please use your albuterol inhaler.  For any breathing difficulty not relieved with the albuterol inhaler you should be seen immediately.

## 2021-01-16 NOTE — ED Triage Notes (Signed)
Patient c/o runny nose, bodyaches, fevers, and nasal congestion that started on Wed.

## 2021-01-16 NOTE — ED Provider Notes (Signed)
[facility]    CSN: [id number] Arrival date & time: 01/16/21  1206      History   Chief Complaint Chief Complaint  Patient presents with  . Fever  . Nasal Congestion  . Generalized Body Aches    HPI Lori Potts is a 75 y.o. female presenting for onset of fevers, body aches, fatigue, nasal congestion, dry cough, left-sided ear pain, headaches 3 days ago.  Patient has taken Tylenol for her fever about 3 hours ago.  Temperature is currently 103 degrees in clinic.  Patient denies any sick contacts and no known exposure to influenza or COVID-19.  She has received the flu vaccine and states she has received 4 COVID vaccines.  She is also had a negative at home COVID test when symptoms began.  Patient denies any sinus pain, chest pain or tightness, wheezing or breathing difficulty, abdominal pain, nausea/vomiting or diarrhea.  Past medical history is significant for asthma but she says she rarely has to use an inhaler.  She says she is not even sure if she really has asthma.  Additionally, she has history of hypertension, migraines, type 2 diabetes.  Patient has no other concerns today.  HPI  Past Medical History:  Diagnosis Date  . Anxiety   . Arthritis   . Asthma   . 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 Cataract    right eye but immature  . Essential hypertension, benign    takes Lisinopril-HCTZ daily  . GERD (gastroesophageal reflux disease)   . Headache    sinus  . History of colon polyps    benign  . History of migraine   . History of shingles   . Hyperlipidemia    takes Lipitor daily  . Low back pain   . Nocturia   . S/P insertion of spinal cord stimulator   . Seasonal allergies    takes Singulair daily as needed  . Type II or unspecified type diabetes mellitus without mention of complication, not stated as uncontrolled   . Vitamin D deficiency    takes Vit D weekly  . Weakness    numbness and tingling left arm  . Wears dentures    full upper  and lower    Patient Active Problem List   Diagnosis Date Noted  . Neutropenia, unspecified type (HCC) 11/25/2020  . Intractable migraine with aura without status migrainosus 04/18/2020  . Body mass index (BMI) 31.0-31.9, adult 02/06/2020  . Chronic left hip pain 12/26/2019  . Lumbar radiculopathy 09/05/2019  . Diabetes mellitus with microalbuminuria (HCC) 12/03/2018  . Microalbuminuria 01/22/2018  . Anemia 09/22/2017  . S/P knee replacement 07/28/2017  . Allergic rhinitis 05/26/2017  . Vitamin D deficiency 05/19/2017  . Chronic nonintractable headache 05/18/2017  . Special screening for malignant neoplasms, colon   . Gastroesophageal reflux disease   . Gastritis without bleeding   . 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 Insomnia 11/29/2016  . 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 Vasomotor flushing 04/19/2016  . Abdominal wall pain in right flank 03/29/2016  . Diverticulosis 02/24/2016  . Hypercholesterolemia 06/16/2015  . FHx: migraine headaches 04/13/2015  . Chronic pain 04/13/2015  . Neck pain 02/26/2015  . Status post lumbar surgery 02/26/2015  . Abnormal ECG 01/27/2015  . Angina pectoris (HCC) 01/27/2015  . 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) 01/27/2015  . Essential (primary) hypertension 01/27/2015    Past Surgical History:  Procedure Laterality Date  .  ABDOMINAL HYSTERECTOMY    . ANTERIOR CERVICAL DECOMP/DISCECTOMY FUSION N/A 02/26/2015   Procedure: ANTERIOR CERVICAL DISCECTOMY FUSION C4-5 (1 LEVEL);  Surgeon: Venita Lick, MD;  Location: [facility] OR;  Service: Orthopedics;  Laterality: N/A;  . ANTERIOR LAT LUMBAR FUSION N/A 03/21/2013   Procedure: ANTERIOR LATERAL LUMBAR FUSION 1 LEVEL/ XLIF L3-L4 ;  Surgeon: Venita Lick, MD;  Location: [facility] OR;  Service: Orthopedics;  Laterality: N/A;  . APPENDECTOMY    . AUGMENTATION MAMMAPLASTY Bilateral 1978  . BACK SURGERY    . BACK SURGERY     Lumbar  fusion x 2  . BREAST EXCISIONAL BIOPSY Right 1970  . BREAST SURGERY  1990   Augementation  . CARDIAC CATHETERIZATION  5/12   ef 55%  . CARDIAC CATHETERIZATION  10/2010   [facility]; EF 55%  . CARDIAC CATHETERIZATION Left 02/16/2016   Procedure: Left Heart Cath and Coronary Angiography;  Surgeon: Alwyn Pea, MD;  Location: [facility] INVASIVE CV LAB;  Service: Cardiovascular;  Laterality: Left;  . COLONOSCOPY    . COLONOSCOPY WITH PROPOFOL N/A 04/10/2017   Procedure: COLONOSCOPY WITH PROPOFOL;  Surgeon: Midge Minium, MD;  Location: [facility];  Service: Gastroenterology;  Laterality: N/A;  Diabetic - insulin  . ESOPHAGOGASTRODUODENOSCOPY    . JOINT REPLACEMENT    . KIDNEY SURGERY  1998   growth removed from left kidney   . LEFT HEART CATH AND CORONARY ANGIOGRAPHY Left 09/10/2018   Procedure: LEFT HEART CATH AND CORONARY ANGIOGRAPHY;  Surgeon: Alwyn Pea, MD;  Location: [facility] INVASIVE CV LAB;  Service: Cardiovascular;  Laterality: Left;  . pain stimulator    . POSTERIOR CERVICAL FUSION/FORAMINOTOMY Right 03/21/2013   Procedure: POSTERIOR L2-3 RIGHT FORAMINOTOMY;  Surgeon: Venita Lick, MD;  Location: [facility] OR;  Service: Orthopedics;  Laterality: Right;  . REVISION OF SCAR TISSUE RECTUS MUSCLE    . SHOULDER ARTHROSCOPY WITH ROTATOR CUFF REPAIR AND SUBACROMIAL DECOMPRESSION Right 09/17/2020   Procedure: RIGHT SHOULDER ARTHROSCOPY WITH MINI-OPEN ROTATOR CUFF REPAIR, DISTAL CLAVICLE EXCISION, AND SUBACROMIAL DECOMPRESSION WITH BICEP TENDONESIS;  Surgeon: Juanell Fairly, MD;  Location: [facility] ORS;  Service: Orthopedics;  Laterality: Right;  . SMALL BOWEL REPAIR    . SPINAL CORD STIMULATOR BATTERY EXCHANGE N/A 10/17/2012   Procedure: SPINAL CORD STIMULATOR BATTERY REMOVAL;  Surgeon: Venita Lick, MD;  Location: [facility] OR;  Service: Orthopedics;  Laterality: N/A;  . SPINAL CORD STIMULATOR BATTERY EXCHANGE N/A 07/22/2015   Procedure: REIMPLANTATION OF SPINAL CORD STIMULATOR BATTERY ;  Surgeon:  Venita Lick, MD;  Location: [facility] OR;  Service: Orthopedics;  Laterality: N/A;  . TOTAL KNEE ARTHROPLASTY Left   . TOTAL KNEE ARTHROPLASTY Right 07/28/2017   Procedure: RIGHT TOTAL KNEE ARTHROPLASTY;  Surgeon: Eugenia Mcalpine, MD;  Location: [facility] ORS;  Service: Orthopedics;  Laterality: Right;    OB History    Gravida  0   Para  0   Term  0   Preterm  0   AB  0   Living        SAB  0   IAB  0   Ectopic  0   Multiple      Live Births               Home Medications    Prior to Admission medications   Medication Sig Start Date End Date Taking? Authorizing Provider  ascorbic acid (VITAMIN C) 500 MG tablet Take 500 mg by mouth daily.   Yes [provider]  aspirin 81 MG EC tablet Take  1 tablet by mouth daily.   Yes [provider]  atorvastatin (LIPITOR) 80 MG tablet TAKE 1 TABLET EVERY DAY 11/25/20  Yes Osvaldo AngstPollak, Adriana M, PA-C  azithromycin (ZITHROMAX) 250 MG tablet Take 1 tablet (250 mg total) by mouth daily. Take first 2 tablets together, then 1 every day until finished. 01/16/21  Yes Eusebio FriendlyEaves, Leighla Chestnutt B, PA-C  citalopram (CELEXA) 20 MG tablet TAKE 1 TABLET DAILY FOR NERVES, STRESS, ANXIETY SYMPTOMS (DOSE CHANGE) 11/17/20  Yes Danelle Berryapia, Leisa, PA-C  Dulaglutide (TRULICITY) 0.75 MG/0.5ML SOPN Inject 0.75 mg into the skin once a week. 08/20/20  Yes Danelle Berryapia, Leisa, PA-C  esomeprazole (NEXIUM) 40 MG capsule TAKE 1 CAPSULE (40 MG TOTAL) BY MOUTH DAILY. FOLLOW UP IN OFFICE FOR MORE REFILLS 12/22/20  Yes Danelle Berryapia, Leisa, PA-C  fluticasone (FLONASE) 50 MCG/ACT nasal spray PLACE 2 SPRAYS INTO BOTH NOSTRILS EVERY EVENING. 12/28/20  Yes Sowles, Danna HeftyKrichna, MD  levocetirizine (XYZAL) 5 MG tablet Take 1 tablet (5 mg total) by mouth every evening. 07/19/19  Yes Doren CustardBoyce, [name] E, [name]  losartan (COZAAR) 25 MG tablet TAKE 1 TABLET EVERY DAY Patient taking differently: Take 25 mg by mouth daily. 01/13/20  Yes Danelle Berryapia, Leisa, PA-C  Multiple Vitamins-Minerals (MULTIVITAMIN WITH MINERALS) tablet Take 1  tablet by mouth daily.   Yes [provider]  oseltamivir (TAMIFLU) 75 MG capsule Take 1 capsule (75 mg total) by mouth every 12 (twelve) hours for 5 days. 01/16/21 01/21/21 Yes Shirlee LatchEaves, Mikael Debell B, PA-C  oxyCODONE-acetaminophen (PERCOCET) 10-325 MG tablet Take 1 tablet by mouth 5 (five) times daily.  03/06/18  Yes Su HoffKnowles, Carol, PA-C  propranolol (INDERAL) 10 MG tablet TAKE 1 TABLET BY MOUTH THREE TIMES A DAY Patient taking differently: Take 10 mg by mouth 2 (two) times daily. 02/13/20  Yes Danelle Berryapia, Leisa, PA-C  rizatriptan (MAXALT-MLT) 5 MG disintegrating tablet Take at first sign of headache. May repeat in 2 hours if needed 11/25/20  Yes Pollak, Adriana M, PA-C  rosuvastatin (CRESTOR) 40 MG tablet Take 1 tablet (40 mg total) by mouth at bedtime. 11/27/20  Yes Danelle Berryapia, Leisa, PA-C  albuterol (VENTOLIN HFA) 108 (90 Base) MCG/ACT inhaler Inhale 1 puff into the lungs every 6 (six) hours as needed for wheezing or shortness of breath. 06/12/19   Doren CustardBoyce, [name] E, [name]  azelastine (ASTELIN) 0.1 % nasal spray PLACE 1 SPRAY INTO BOTH NOSTRILS 2 (TWO) TIMES DAILY. USE IN EACH NOSTRIL AS DIRECTED 12/28/20   Carlynn PurlSowles, Danna HeftyKrichna, MD  benzonatate (TESSALON) 100 MG capsule Take 1-2 capsules (100-200 mg total) by mouth 2 (two) times daily as needed. 12/16/20   Alba CorySowles, Krichna, MD  Blood Glucose Monitoring Suppl (TRUE METRIX AIR GLUCOSE METER) DEVI True Metrix Glucose Meter    [provider]  chlorpheniramine-HYDROcodone (TUSSIONEX PENNKINETIC ER) 10-8 MG/5ML SUER Take 5 mLs by mouth every 12 (twelve) hours as needed. 12/22/20   Alba CorySowles, Krichna, MD  fluticasone furoate-vilanterol (BREO ELLIPTA) 100-25 MCG/INH AEPB Inhale 1 puff into the lungs daily.    [provider]  ipratropium-albuterol (DUONEB) 0.5-2.5 (3) MG/3ML SOLN CAN TAKE 3ML EVERY 20 MINUTES FOR 2 DOSES AS NEEDED FOR WHEEZING Patient taking differently: Inhale 3 mLs into the lungs every 6 (six) hours as needed (SOB). 07/01/19   Danelle Berryapia, Leisa, PA-C  Lancets  33G MISC     [provider]  meloxicam (MOBIC) 7.5 MG tablet Mobic 7.5 mg tablet  Take 1 tablet twice a day by oral route.    [provider]  methocarbamol (ROBAXIN) 500 MG tablet Take 500  mg by mouth every 8 (eight) hours as needed for muscle spasms.    [provider]  montelukast (SINGULAIR) 10 MG tablet Take 1 tablet (10 mg total) by mouth at bedtime. 12/16/20   Alba Cory, MD  sodium chloride (OCEAN) 0.65 % SOLN nasal spray Place 1 spray into both nostrils 2 (two) times daily as needed for congestion. 11/19/18   Poulose, Percell Belt, NP    Family History Family History  Problem Relation Age of Onset  . Heart attack Mother   . Sarcoidosis Sister     Social History Social History   Tobacco Use  . Smoking status: Former Smoker    Types: Cigarettes    Quit date: 2006    Years since quitting: 16.4  . Smokeless tobacco: Never Used  . Tobacco comment: quit smoking 11yrs ago  Vaping Use  . Vaping Use: Never used  Substance Use Topics  . Alcohol use: No  . Drug use: No     Allergies   Metformin and related and Adhesive [tape]   Review of Systems Review of Systems  Constitutional: Positive for fatigue and fever. Negative for chills and diaphoresis.  HENT: Positive for congestion, ear pain and rhinorrhea. Negative for sinus pressure, sinus pain and sore throat.   Respiratory: Positive for cough. Negative for chest tightness, shortness of breath and wheezing.   Cardiovascular: Negative for chest pain.  Gastrointestinal: Negative for abdominal pain, nausea and vomiting.  Musculoskeletal: Positive for myalgias. Negative for arthralgias.  Skin: Negative for rash.  Neurological: Positive for headaches. Negative for weakness.  Hematological: Negative for adenopathy.     Physical Exam Triage Vital Signs ED Triage Vitals  Enc Vitals Group     BP 01/16/21 1223 (!) 142/71     Pulse Rate 01/16/21 1223 (!) 106     Resp 01/16/21 1223 14     Temp  01/16/21 1223 (!) 103 F (39.4 C)     Temp Source 01/16/21 1223 Oral     SpO2 01/16/21 1223 95 %     Weight 01/16/21 1219 185 lb (83.9 kg)     Height 01/16/21 1219 5' 3.5" (1.613 m)     Head Circumference --      Peak Flow --      Pain Score 01/16/21 1219 8     Pain Loc --      Pain Edu? --      Excl. in GC? --    No data found.  Updated Vital Signs BP (!) 142/71 (BP Location: Left Arm)   Pulse (!) 106   Temp (!) 100.5 F (38.1 C) (Oral)   Resp 14   Ht 5' 3.5" (1.613 m)   Wt 185 lb (83.9 kg)   SpO2 95%   BMI 32.26 kg/m       Physical Exam Vitals and nursing note reviewed.  Constitutional:      General: She is not in acute distress.    Appearance: Normal appearance. She is ill-appearing. She is not toxic-appearing or diaphoretic.  HENT:     Head: Normocephalic and atraumatic.     Right Ear: Tympanic membrane, ear canal and external ear normal.     Left Ear: Tympanic membrane, ear canal and external ear normal.     Nose: Congestion and rhinorrhea present.     Mouth/Throat:     Mouth: Mucous membranes are moist.     Pharynx: Oropharynx is clear. No posterior oropharyngeal erythema.  Eyes:     General: No  scleral icterus.       Right eye: No discharge.        Left eye: No discharge.     Conjunctiva/sclera: Conjunctivae normal.  Cardiovascular:     Rate and Rhythm: Normal rate and regular rhythm.     Heart sounds: Normal heart sounds.  Pulmonary:     Effort: Pulmonary effort is normal. No respiratory distress.     Breath sounds: Normal breath sounds. No wheezing, rhonchi or rales.  Musculoskeletal:     Cervical back: Neck supple.  Skin:    General: Skin is dry.  Neurological:     General: No focal deficit present.     Mental Status: She is alert. Mental status is at baseline.     Motor: No weakness.     Gait: Gait normal.  Psychiatric:        Mood and Affect: Mood normal.        Behavior: Behavior normal.        Thought Content: Thought content normal.       UC Treatments / Results  Labs (all labs ordered are listed, but only abnormal results are displayed) Labs Reviewed  RESP PANEL BY RT-PCR (FLU A&B, COVID) [id number]    EKG   Radiology No results found.  Procedures Procedures (including critical care time)  Medications Ordered in UC Medications  ibuprofen (ADVIL) tablet 600 mg (600 mg Oral Given 01/16/21 1228)    Initial Impression / Assessment and Plan / UC Course  I have reviewed the triage vital signs and the nursing notes.  Pertinent labs & imaging results that were available during my care of the patient were reviewed by me and considered in my medical decision making (see chart for details).   75 year old female presenting for approximately 3-day history of fever, fatigue, body aches, cough, congestion, headaches.  Temperature in clinic is 103 degrees.  Patient reportedly took Tylenol a couple of hours prior to arrival.  Patient given ibuprofen in clinic.  Recheck of fever less than an hour later did come down to 100.5 degrees.  Exam significant for ill-appearing but nontoxic female.  She does have mild nasal congestion and clear rhinorrhea.  Her chest is clear to auscultation heart regular rate and rhythm.  Respiratory panel obtained today due to concern for possible influenza or COVID-19.  Respiratory panel is negative for influenza as well as COVID-19.  I do not have high suspicion for COVID-19 given she had a negative test at home a couple of days ago, negative test here, and has been vaccinated for COVID-19 x4.  I do believe that she still may have influenza or influenza-like illness.  Suggest a chest x-ray to rule out possible pneumonia, but patient says she is not concerned about that based on her symptoms.  Honestly, this is less likely.  She does have history of asthma so I went ahead and printed a prescription for azithromycin for her to fill if her fevers or not resolving over the next couple of days or her  symptoms worsen.  I have sent Tamiflu to the pharmacy to treat possible influenza.  Advised her to increase rest and fluids.  Advised over-the-counter Robitussin as needed for cough.  She declined any prescriptions for cough medication.  Patient states that she just wants to go home and rest.  Advised her Tylenol and Motrin for her fever.  Advised her to use her albuterol inhaler if needed if short of breath.  Advised she needs to go to the  emergency department for any shortness of breath not improved with the use of her inhaler for any severe acute worsening of any of her symptoms.  Patient is agreeable to plan.   Final Clinical Impressions(s) / UC Diagnoses   Final diagnoses:  Influenza-like illness  Fever, unspecified  Cough  Nasal congestion  Respiratory illness with fever     Discharge Instructions     You are negative for influenza as well as COVID-19.  I suspect you still could have influenza or an influenza-like viral illness.  I have sent Tamiflu for you to start.  You may alternate Tylenol and ibuprofen as needed for the fever.  Increase rest and fluids.  Can take over-the-counter Robitussin as needed for cough.  You have declined a chest x-ray today but if you feel like your cough is becoming productive or worse, your fever continues after the next 2 to 3 days or you have chest pain or breathing difficulty you should be seen again immediately.  For any severe symptoms, please go to emergency department.  It is still possible you have pneumonia although based on your presentation I have a lower suspicion for that.  If you develop any other new/significant symptom that points in another direction you should be seen again.  If you become short of breath, please use your albuterol inhaler.  For any breathing difficulty not relieved with the albuterol inhaler you should be seen immediately.    ED Prescriptions    Medication Sig Dispense Auth. Provider   oseltamivir (TAMIFLU) 75 MG  capsule Take 1 capsule (75 mg total) by mouth every 12 (twelve) hours for 5 days. 10 capsule Eusebio Friendly B, PA-C   azithromycin (ZITHROMAX) 250 MG tablet Take 1 tablet (250 mg total) by mouth daily. Take first 2 tablets together, then 1 every day until finished. 6 tablet Gareth Morgan     PDMP not reviewed this encounter.   Shirlee Latch, PA-C 01/16/21 1322

## 2021-01-19 ENCOUNTER — Other Ambulatory Visit: Payer: Self-pay

## 2021-01-19 ENCOUNTER — Telehealth (INDEPENDENT_AMBULATORY_CARE_PROVIDER_SITE_OTHER): Payer: Medicare HMO | Admitting: Unknown Physician Specialty

## 2021-01-19 ENCOUNTER — Encounter: Payer: Self-pay | Admitting: Unknown Physician Specialty

## 2021-01-19 DIAGNOSIS — N3001 Acute cystitis with hematuria: Secondary | ICD-10-CM | POA: Diagnosis not present

## 2021-01-19 DIAGNOSIS — B349 Viral infection, unspecified: Secondary | ICD-10-CM

## 2021-01-19 LAB — POCT URINALYSIS DIPSTICK
Bilirubin, UA: NEGATIVE
Glucose, UA: NEGATIVE
Nitrite, UA: NEGATIVE
Protein, UA: POSITIVE — AB
Spec Grav, UA: 1.02 (ref 1.010–1.025)
Urobilinogen, UA: 0.2 E.U./dL
pH, UA: 5 (ref 5.0–8.0)

## 2021-01-19 NOTE — Progress Notes (Signed)
There were no vitals taken for this visit.   Subjective:    Patient ID: Lori Potts, female    DOB: [birth year], 75 y.o.   MRN: [id number]  HPI: Lori Potts is a 75 y.o. female  Chief Complaint  Patient presents with  . Follow-up    Urgent Care Visit  . Urinary Tract Infection  . Flu Vaccine   This visit was completed via telephone due to the restrictions of the COVID-19 pandemic. All issues as above were discussed and addressed but no physical exam was performed. If it was felt that the patient should be evaluated in the office, they were directed there. The patient verbally consented to this visit. Patient was unable to complete an audio/visual visit due to Technical difficulties. . Location of the patient: home . Location of the provider: home . Those involved with this call:  . Provider: Gabriel Cirri [name] . CMA: Riesa Pope  . Time spent on call: 20 minutes on the phone discussing health concerns. 10 minutes total spent in review of patient's record and preparation of their chart.  I verified patient identity using two factors (patient name and date of birth). Patient consents verbally to being seen via telemedicine visit today.   Sx onset 6 days ago whn she noticed "feeling funny" and then "fell apart" with fatigue.  Went to urgent care on 5/28 for flu like symptoms. Negative Covid and flu tests.  Determined Covid less likely and treated for the flu with Tamiflu and Z pack.  Declined chest x-ray.     Treated on 5/10 tx with Keflex for a UTI.  Culture results were negative for a UTI  Today, overall symptoms are improving.  She feels her energy is better.  Mild cough starting this morning and afebrile.  Still feels like she has problems with her urinary tract.  States she urinates different amounts and has "the same feelings" she gets with UTIs.  States today she is having some lower back pain.     Relevant past medical, surgical, family and social history reviewed  and updated as indicated. Interim medical history since our last visit reviewed. Allergies and medications reviewed and updated.   Per HPI unless specifically indicated above     Objective:    There were no vitals taken for this visit.  Wt Readings from Last 3 Encounters:  01/16/21 185 lb (83.9 kg)  12/29/20 183 lb 9.6 oz (83.3 kg)  11/25/20 184 lb (83.5 kg)    Physical Exam Neurological:     Mental Status: She is alert and oriented to person, place, and time.  Psychiatric:        Mood and Affect: Mood normal.        Thought Content: Thought content normal.     Results for orders placed or performed during the hospital encounter of 01/16/21  Resp Panel by RT-PCR (Flu A&B, Covid) Nasopharyngeal Swab   Specimen: Nasopharyngeal Swab; Nasopharyngeal(NP) swabs in vial transport medium  Result Value Ref Range   SARS Coronavirus 2 by RT PCR NEGATIVE NEGATIVE   Influenza A by PCR NEGATIVE NEGATIVE   Influenza B by PCR NEGATIVE NEGATIVE      Assessment & Plan:   Problem List Items Addressed This Visit   None   Visit Diagnoses    Acute cystitis with hematuria       Negative urine about 4 weeks ago complicated by illness evaluated in the ER.  Get another urine culture and check CBC  Relevant Orders   POCT urinalysis dipstick   Urine Culture   Urinalysis, microscopic only   Viral illness       From urgent care with negative Covid and flu.  Tx with Zpack and blood cx. Sxs resolving and reassured. Check CBC   Relevant Orders   CBC      Recheck Covid test.    Follow up plan: results

## 2021-01-19 NOTE — Addendum Note (Signed)
Addended by: Kenyon Eichelberger, Sherrill Raring on: 01/19/2021 01:32 PM   Modules accepted: Orders

## 2021-01-20 LAB — CBC WITH DIFFERENTIAL/PLATELET
Absolute Monocytes: 431 cells/uL (ref 200–950)
Basophils Absolute: 53 cells/uL (ref 0–200)
Basophils Relative: 0.9 %
Eosinophils Absolute: 289 cells/uL (ref 15–500)
Eosinophils Relative: 4.9 %
HCT: 36.3 % (ref 35.0–45.0)
Hemoglobin: 11.7 g/dL (ref 11.7–15.5)
Lymphs Abs: 1245 cells/uL (ref 850–3900)
MCH: 25.5 pg — ABNORMAL LOW (ref 27.0–33.0)
MCHC: 32.2 g/dL (ref 32.0–36.0)
MCV: 79.3 fL — ABNORMAL LOW (ref 80.0–100.0)
MPV: 10.9 fL (ref 7.5–12.5)
Monocytes Relative: 7.3 %
Neutro Abs: 3882 cells/uL (ref 1500–7800)
Neutrophils Relative %: 65.8 %
Platelets: 531 10*3/uL — ABNORMAL HIGH (ref 140–400)
RBC: 4.58 10*6/uL (ref 3.80–5.10)
RDW: 15.8 % — ABNORMAL HIGH (ref 11.0–15.0)
Total Lymphocyte: 21.1 %
WBC: 5.9 10*3/uL (ref 3.8–10.8)

## 2021-01-20 LAB — URINE CULTURE
MICRO NUMBER:: 11951505
SPECIMEN QUALITY:: ADEQUATE

## 2021-01-20 LAB — URINALYSIS, MICROSCOPIC ONLY

## 2021-02-02 ENCOUNTER — Telehealth: Payer: Self-pay | Admitting: Emergency Medicine

## 2021-02-02 NOTE — Telephone Encounter (Signed)
Patient stated she received her urine results but not actual labs. Please advise

## 2021-02-03 NOTE — Telephone Encounter (Signed)
Patient notified. She expressed understanding and appreciation for the call back.

## 2021-02-09 DIAGNOSIS — Z5181 Encounter for therapeutic drug level monitoring: Secondary | ICD-10-CM | POA: Diagnosis not present

## 2021-02-09 DIAGNOSIS — G894 Chronic pain syndrome: Secondary | ICD-10-CM | POA: Diagnosis not present

## 2021-02-09 DIAGNOSIS — Z79899 Other long term (current) drug therapy: Secondary | ICD-10-CM | POA: Diagnosis not present

## 2021-02-15 DIAGNOSIS — Z9889 Other specified postprocedural states: Secondary | ICD-10-CM | POA: Diagnosis not present

## 2021-02-23 ENCOUNTER — Telehealth: Payer: Self-pay

## 2021-02-23 NOTE — Progress Notes (Signed)
Spoke to patient to see if she would be okay with rescheduling  telephone appointment on 02/24/2021 for CCM at 1:00 pm with [name] the Clinical pharmacist to 03/10/2021 at 2:00 pm .   Patient states she is okay with moving her telephone appointment to 03/10/2021 at 2:00 pm. Sent message to scheduler. Patient aware to have all medications, supplements, blood pressure and blood sugar logs to visit.  Questions: Have you had any recent office visit or specialist visit outside of [facility]?No Are there any concerns you would like to discuss during your office visit?NO Are you having any problems obtaining your medications? NO If patient has any PAP medications ask if they are having any problems getting their PAP medication or refill?NO  Star Rating Drug: Atorvastatin 80 mg last filled on 11/26/2020 for 90 day supply at [facility].(Patient states she is not taking Atorvastatin no more) Trulicity 0.75 mg last filled on 11/30/2020 for [facility] day supply at [facility]. Losartan 25 mg last filled on 09/15/2020 for 90 day supply at [facility]. Rosuvastatin 40 mg last filled on 11/27/2020 for 90 day supply at [facility].    [name] Clinical Pharmacist Assistant [phone]

## 2021-02-24 ENCOUNTER — Telehealth: Payer: Self-pay

## 2021-03-09 ENCOUNTER — Telehealth: Payer: Self-pay

## 2021-03-09 NOTE — Progress Notes (Signed)
    Chronic Care Management Pharmacy Assistant   Name: Lori Potts  MRN: [id number] DOB: 10/04/1945  Patient called to remind of appointment with [name], CPP  on 03/10/2021 @ 1400 via telephone.   No answer, left message of appointment date, time and type of appointment (either telephone or in person). Left message to have all medications, supplements, blood pressure and/or blood sugar logs available during appointment and to return call if need to reschedule.  Star Rating Drug: Rosuvastatin 80 mg last filled on 11/27/2020 for a 90-Day supply with [facility] Atorvastatin 80 mg last filled on 11/26/2020 for a 90-Day supply with [facility] Trulicity 0.75 mg last filled on 11/30/2020 for a 84-Day supply with [facility] Losartan 25 mg last filled on 09/15/2020 for a 90-Day supply with [facility]  Any gaps in medications fill history? Yes   Care Gaps: MAMMOGRAM  Adelene Idler, CPA/CMA Clinical Pharmacist Assistant Phone: [phone]

## 2021-03-10 ENCOUNTER — Telehealth: Payer: Self-pay

## 2021-03-10 NOTE — Progress Notes (Deleted)
Chronic Care Management Pharmacy Note  03/10/2021 Name:  IMA HAFNER MRN:  [id number] DOB:  11/08/1945  Summary: ***  Recommendations/Changes made from today's visit: ***  Plan: ***   Subjective: Lori Potts is an 75 y.o. year old female who is a primary patient of Delsa Grana, [place].  The CCM team was consulted for assistance with disease management and care coordination needs.    Engaged with patient by telephone for follow up visit in response to provider referral for pharmacy case management and/or care coordination services.   Consent to Services:  The patient was given information about Chronic Care Management services, agreed to services, and gave verbal consent prior to initiation of services.  Please see initial visit note for detailed documentation.   Patient Care Team: Delsa Grana, PA-C as PCP - General (Family Medicine) Germaine Pomfret, [facility] (Pharmacist) Thornton Park, MD as Referring Physician (Orthopedic Surgery) Levy Pupa, PA-C as Physician Assistant (Pain Medicine)  Recent office visits: 01/19/21: Video visit with Kathrine Haddock, NP for UTI.  12/29/20: Patient presented to Delsa Grana, PA-C for UTI. cephalexin 500 mg three times daily  12/16/20: Patient presented to Dr. Ancil Boozer for sinusitis exacerbation. Azelastine 0.1% 1 spray. Benzonatate, Flonase, montelukast 10 mg daily.   Recent consult visits: [facility] visits: 01/16/21: Patient presented to ED for respiratory illness.    Objective:  Lab Results  Component Value Date   CREATININE 0.64 11/25/2020   BUN 15 11/25/2020   GFRNONAA >60 09/14/2020   GFRAA 101 02/18/2020   NA 141 11/25/2020   K 4.4 11/25/2020   CALCIUM 9.8 11/25/2020   CO2 30 11/25/2020   GLUCOSE 113 (H) 11/25/2020    Lab Results  Component Value Date/Time   HGBA1C 7.2 (A) 11/25/2020 08:04 AM   HGBA1C 7.2 (H) 11/14/2019 12:06 PM   HGBA1C 6.6 03/31/2019 12:00 AM   HGBA1C 6.3 (H) 05/09/2018 09:50 AM    MICROALBUR 5.2 05/14/2019 12:00 AM   MICROALBUR 3.8 05/09/2018 09:50 AM   MICROALBUR 20 07/04/2016 10:36 AM    Last diabetic Eye exam:  Lab Results  Component Value Date/Time   HMDIABEYEEXA No Retinopathy 06/02/2020 12:00 AM    Last diabetic Foot exam: No results found for: HMDIABFOOTEX   Lab Results  Component Value Date   CHOL 238 (H) 11/25/2020   HDL 61 11/25/2020   LDLCALC 154 (H) 11/25/2020   TRIG 112 11/25/2020   CHOLHDL 3.9 11/25/2020    Hepatic Function Latest Ref Rng & Units 11/25/2020 02/18/2020 11/14/2019  Total Protein 6.1 - 8.1 g/dL 7.2 6.6 6.7  Albumin 3.5 - 5.0 g/dL - - -  AST 10 - 35 U/L _0 ALT 6 - 29 U/L _1 Alk Phosphatase 38 - 126 U/L - - -  Total Bilirubin 0.2 - 1.2 mg/dL 0.4 0.5 0.5    Lab Results  Component Value Date/Time   TSH 2.00 02/18/2020 09:37 AM   TSH 0.71 11/14/2019 12:06 PM   FREET4 1.1 02/18/2020 09:37 AM    CBC Latest Ref Rng & Units 01/19/2021 09/14/2020 02/18/2020  WBC 3.8 - 10.8 Thousand/uL 5.9 5.3 4.2  Hemoglobin 11.7 - 15.5 g/dL 11.7 13.0 12.3  Hematocrit 35.0 - 45.0 % 36.3 39.3 38.1  Platelets 140 - 400 Thousand/uL 531(H) 414(H) 361    Lab Results  Component Value Date/Time   VD25OH 24 (L) 05/09/2018 09:50 AM   VD25OH 25 (L) 09/11/2017 02:09 PM    Clinical ASCVD: {YES/NO:21197} The  10-year ASCVD risk score Mikey Bussing DC Brooke Bonito., et al., 2013) is: 43.6%   Values used to calculate the score:     Age: 65 years     Sex: Female     Is Non-Hispanic African American: Yes     Diabetic: Yes     Tobacco smoker: No     Systolic Blood Pressure: 248 mmHg     Is BP treated: Yes     HDL Cholesterol: 61 mg/dL     Total Cholesterol: 238 mg/dL    Depression screen [facility] 2/9 01/19/2021 12/29/2020 12/16/2020  Decreased Interest 0 0 0  Down, Depressed, Hopeless 0 0 0  PHQ - 2 Score 0 0 0  Altered sleeping 0 0 0  Tired, decreased energy 0 0 0  Change in appetite 0 0 0  Feeling bad or failure about yourself  0 0 0  Trouble concentrating 0  0 0  Moving slowly or fidgety/restless 0 0 0  Suicidal thoughts 0 0 0  PHQ-9 Score 0 0 0  Difficult doing work/chores Not difficult at all Not difficult at all Not difficult at all  Some recent data might be hidden     ***Other: (CHADS2VASc if Afib, MMRC or CAT for COPD, ACT, DEXA)  Social History   Tobacco Use  Smoking Status Former   Types: Cigarettes   Quit date: 2006   Years since quitting: 16.5  Smokeless Tobacco Never  Tobacco Comments   quit smoking 79yr ago   BP Readings from Last 3 Encounters:  01/16/21 (!) 142/71  12/29/20 130/72  11/25/20 124/82   Pulse Readings from Last 3 Encounters:  01/16/21 (!) 106  12/29/20 72  11/25/20 86   Wt Readings from Last 3 Encounters:  01/16/21 185 lb (83.9 kg)  12/29/20 183 lb 9.6 oz (83.3 kg)  11/25/20 184 lb (83.5 kg)   BMI Readings from Last 3 Encounters:  01/16/21 32.26 kg/m  12/29/20 32.01 kg/m  11/25/20 32.08 kg/m    Assessment/Interventions: Review of patient past medical history, allergies, medications, health status, including review of consultants reports, laboratory and other test data, was performed as part of comprehensive evaluation and provision of chronic care management services.   SDOH:  (Social Determinants of Health) assessments and interventions performed: {yes/no:20286}  SDOH Screenings   Alcohol Screen: Low Risk    Last Alcohol Screening Score (AUDIT): 0  Depression (PHQ2-9): Low Risk    PHQ-2 Score: 0  Financial Resource Strain: Low Risk    Difficulty of Paying Living Expenses: Not very hard  Food Insecurity: No Food Insecurity   Worried About RCharity fundraiserin the Last Year: Never true   Ran Out of Food in the Last Year: Never true  Housing: Low Risk    Last Housing Risk Score: 0  Physical Activity: Inactive   Days of Exercise per Week: 0 days   Minutes of Exercise per Session: 0 min  Social Connections: SEngine[employer], building servicesof Communication with Friends and Family:  More than three times a week   Frequency of Social Gatherings with Friends and Family: Twice a week   Attends Religious Services: More than 4 times per year   Active Member of [employer] Organizations: Yes   Attends CArchivistMeetings: 1 to 4 times per year   Marital Status: Married  Stress: No Stress Concern Present   Feeling of Stress : Not at all  Tobacco Use: Medium Risk   Smoking Tobacco Use: Former  Smokeless Tobacco Use: Never  Transportation Needs: No Transportation Needs   Lack of Transportation (Medical): No   Lack of Transportation (Non-Medical): No    CCM Care Plan  Allergies  Allergen Reactions   Metformin And Related Diarrhea   Adhesive [Tape] Rash and Other (See Comments)    Regular tape is ok, allergy is to paper tape    Medications Reviewed Today     Reviewed by Jessee Avers, RN (Registered Nurse) on 01/16/21 at 1221  Med List Status: <None>   Medication Order Taking? Sig Documenting Provider Last Dose Status Informant  albuterol (VENTOLIN HFA) 108 (90 Base) MCG/ACT inhaler [id number]  Inhale 1 puff into the lungs every 6 (six) hours as needed for wheezing or shortness of breath. Hubbard Hartshorn, [name]  Active Self  ascorbic acid (VITAMIN C) 500 MG tablet [id number] Yes Take 500 mg by mouth daily. [provider]  Active Self  aspirin 81 MG EC tablet [id number] Yes Take 1 tablet by mouth daily. [provider]  Active   atorvastatin (LIPITOR) 80 MG tablet [id number] Yes TAKE 1 TABLET EVERY DAY Pollak, Adriana M, PA-C  Active   azelastine (ASTELIN) 0.1 % nasal spray [id number]  PLACE 1 SPRAY INTO BOTH NOSTRILS 2 (TWO) TIMES DAILY. USE IN EACH NOSTRIL AS DIRECTED Steele Sizer, MD  Active   benzonatate (TESSALON) 100 MG capsule [id number]  Take 1-2 capsules (100-200 mg total) by mouth 2 (two) times daily as needed. Steele Sizer, MD  Active   Blood Glucose Monitoring Suppl (TRUE METRIX AIR GLUCOSE METER) DEVI [id number]  True Metrix  Glucose Meter [provider]  Active   chlorpheniramine-HYDROcodone (TUSSIONEX PENNKINETIC ER) 10-8 MG/5ML SUER [id number]  Take 5 mLs by mouth every 12 (twelve) hours as needed. Steele Sizer, MD  Active   citalopram (CELEXA) 20 MG tablet [id number] Yes TAKE 1 TABLET DAILY FOR NERVES, STRESS, ANXIETY SYMPTOMS (DOSE CHANGE) Delsa Grana, PA-C  Active   Dulaglutide (TRULICITY) 9.02 IO/9.7DZ SOPN [id number] Yes Inject 0.75 mg into the skin once a week. Delsa Grana, PA-C  Active Self           Med Note Leafy Ro Sep 03, 2020  2:21 PM) Tuesdays   esomeprazole (NEXIUM) 40 MG capsule [id number] Yes TAKE 1 CAPSULE (40 MG TOTAL) BY MOUTH DAILY. FOLLOW UP IN OFFICE FOR MORE REFILLS Delsa Grana, PA-C  Active   fluticasone (FLONASE) 50 MCG/ACT nasal spray [id number] Yes PLACE 2 SPRAYS INTO BOTH NOSTRILS EVERY EVENING. Steele Sizer, MD  Active   fluticasone furoate-vilanterol (BREO ELLIPTA) 100-25 MCG/INH AEPB [id number]  Inhale 1 puff into the lungs daily. [provider]  Consider Medication Status and Discontinue (Completed Course) Self  ipratropium-albuterol (DUONEB) 0.5-2.5 (3) MG/3ML SOLN [id number]  CAN TAKE 3ML EVERY 20 MINUTES FOR 2 DOSES AS NEEDED FOR WHEEZING  Patient taking differently: Inhale 3 mLs into the lungs every 6 (six) hours as needed (SOB).   Delsa Grana, PA-C  Active   Lancets 33G MISC [id number]   [provider]  Active   levocetirizine (XYZAL) 5 MG tablet [id number] Yes Take 1 tablet (5 mg total) by mouth every evening. Hubbard Hartshorn, [name]  Active   losartan (COZAAR) 25 MG tablet [id number] Yes TAKE 1 TABLET EVERY DAY  Patient taking differently: Take 25 mg by mouth daily.   Delsa Grana, PA-C  Active   meloxicam (MOBIC) 7.5 MG tablet [id number]  Mobic 7.5 mg tablet  Take 1 tablet twice a day  by oral route. [provider]  Consider Medication Status and Discontinue (Completed Course)   methocarbamol (ROBAXIN) 500 MG tablet [id number]   Take 500 mg by mouth every 8 (eight) hours as needed for muscle spasms. [provider]  Consider Medication Status and Discontinue (Completed Course)   montelukast (SINGULAIR) 10 MG tablet [id number]  Take 1 tablet (10 mg total) by mouth at bedtime. Steele Sizer, MD  Consider Medication Status and Discontinue (Completed Course)   Multiple Vitamins-Minerals (MULTIVITAMIN WITH MINERALS) tablet [id number] Yes Take 1 tablet by mouth daily. [provider]  Active Self  oxyCODONE-acetaminophen (PERCOCET) 10-325 MG tablet [id number] Yes Take 1 tablet by mouth 5 (five) times daily.  Levy Pupa, PA-C  Active Self           Med Note (JEFFRIES, HELEN G   Wed Nov 25, 2020  8:00 AM) prn  propranolol (INDERAL) 10 MG tablet [id number] Yes TAKE 1 TABLET BY MOUTH THREE TIMES A DAY  Patient taking differently: Take 10 mg by mouth 2 (two) times daily.   Delsa Grana, PA-C  Active   rizatriptan (MAXALT-MLT) 5 MG disintegrating tablet [id number] Yes Take at first sign of headache. May repeat in 2 hours if needed Pollak, Adriana M, PA-C  Active   rosuvastatin (CRESTOR) 40 MG tablet [id number] Yes Take 1 tablet (40 mg total) by mouth at bedtime. Delsa Grana, PA-C  Active   sodium chloride (OCEAN) 0.65 % SOLN nasal spray [id number]  Place 1 spray into both nostrils 2 (two) times daily as needed for congestion. 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[place]) 12/03/2018   Microalbuminuria 01/22/2018   Anemia 09/22/2017   S/P knee replacement 07/28/2017   Allergic rhinitis 05/26/2017   Vitamin D deficiency 05/19/2017   Chronic nonintractable headache  05/18/2017   Special screening for malignant neoplasms, colon    Gastroesophageal reflux disease    Gastritis without bleeding    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[place]) 03/02/2017   Insomnia 11/29/2016   Chronic right-sided low back pain with right-sided sciatica 04/19/2016   Vasomotor flushing 04/19/2016   Abdominal wall pain in right flank 03/29/2016   Diverticulosis 02/24/2016   Hypercholesterolemia 06/16/2015   FHx: migraine headaches 04/13/2015   Chronic pain 04/13/2015   Neck pain 02/26/2015   Status post lumbar surgery 02/26/2015   Abnormal ECG 01/27/2015   Angina pectoris ([place]) 01/27/2015   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[place]) 01/27/2015   Essential (primary) hypertension 01/27/2015    Immunization History  Administered Date(s) Administered   Fluad Quad(high Dose 65+) 05/08/2019   Influenza, High Dose Seasonal PF 06/10/2015, 06/20/2016, 05/09/2017, 05/09/2018, 04/17/2020   PFIZER(Purple Top)SARS-COV-2 Vaccination 09/11/2019, 10/02/2019, 06/08/2020, 12/29/2020   Pneumococcal Conjugate-13 11/28/2013   Pneumococcal Polysaccharide-23 03/16/2018   Tdap 05/08/2019    Conditions to be addressed/monitored:  Hypertension, Hyperlipidemia, Diabetes, Coronary Artery Disease, GERD, and Allergic Rhinitis  There are no care plans that you recently modified to display for this patient.    Medication Assistance: {MEDASSISTANCEINFO:25044}  Compliance/Adherence/Medication fill history: Care Gaps: ***  Star-Rating Drugs: ***  Patient's preferred pharmacy is:  [id number] [place], [place] -  [place] [address] Phone: [phone] Fax: [phone]  [facility] - [place], [address] [place] [address] Phone: [phone] Fax: [phone]  [facility] ([place], [address] [place] [address] Phone: [phone] Fax: [phone]  [id number] [address]: [phone]: [phone] Uses pill box? {Yes or If no, why not?:20788} Pt endorses ***% compliance  We discussed: {Pharmacy options:24294} Patient decided to: {US Pharmacy Plan:23885}  Care Plan and Follow Up Patient Decision:  {FOLLOWUP:24991}  Plan: {CM FOLLOW UP PVEHM:09470} ***  Current Barriers:  {pharmacybarriers:24917}  Pharmacist Clinical Goal(s):  Patient will {PHARMACYGOALCHOICES:24921} through collaboration with PharmD and provider.   Interventions: 1:1 collaboration with TDelsa Grana PA-C regarding development and update of comprehensive plan of care as evidenced by provider attestation and co-signature Inter-disciplinary care team collaboration (see longitudinal plan of care) Comprehensive medication review performed; medication list updated in electronic medical record  Hypertension (BP goal {CHL HP UPSTREAM Pharmacist BP ranges:[phone]}) -{US controlled/uncontrolled:25276} -Current treatment: Losartan 25 mg daily  Propanolol 10 mg twice daily  -Medications previously tried: ***  -Current home readings: *** -Current dietary habits: *** -Current exercise habits: *** -{ACTIONS;DENIES/REPORTS:21021675} hypotensive/hypertensive symptoms -Educated on {CCM BP Counseling:25124} -Counseled to monitor BP at home ***, document, and provide log at future appointments -{CCMPHARMDINTERVENTION:25122}  Hyperlipidemia: (LDL goal < ***) -{US controlled/uncontrolled:25276} -Current treatment: Atorvastatin 80 mg daily  Rosuvastatin 40 mg daily  -Medications previously tried: ***  -Current dietary patterns: *** -Current exercise habits: *** -Educated on {CCM HLD Counseling:25126} -{CCMPHARMDINTERVENTION:25122}  Diabetes (A1c goal <8%) -Controlled -Current medications: Trulicity 09.62mg  weekly  -Medications previously tried: NA  -Current home glucose readings fasting glucose: *** post prandial glucose: *** -{ACTIONS;DENIES/REPORTS:21021675} hypoglycemic/hyperglycemic symptoms -Current meal patterns:  breakfast: ***  lunch: ***  dinner: *** snacks: *** drinks: *** -Current exercise: *** -Educated on {CCM DM COUNSELING:25123} -Counseled to check feet daily and get yearly eye exams -{CCMPHARMDINTERVENTION:25122}  *** (Goal: ***) -{US controlled/uncontrolled:25276} -Current treatment  *** -Medications previously tried: ***  -{CCMPHARMDINTERVENTION:25122}  Patient Goals/Self-Care Activities Patient will:  - {pharmacypatientgoals:24919}  Follow Up Plan: {CM FOLLOW UP PEZMO:29476}

## 2021-03-17 ENCOUNTER — Telehealth: Payer: Self-pay

## 2021-03-17 NOTE — Progress Notes (Signed)
Left Voice message to confirmed patient telephone appointment on 03/18/2021 for CCM at 10:00 am with [name] the Clinical pharmacist. Left message to have all medications, supplements, blood pressure and blood sugar logs available during appointment and to return call if need to reschedule.   Care Gaps: None Star Rating Drug: Rosuvastatin 80 mg last filled on 11/27/2020 [facility]Day [facility] Atorvastatin 80 mg last filled on 11/26/2020 for a 90-Day supply with [facility] Trulicity 0.75 mg last filled on 11/30/2020 for a [facility]-Day supply [facility] Losartan 25 mg last filled on 09/15/2020 for a 90-Day supply with [facility]  Any gaps in medications fill history? Yes   [name] Clinical Pharmacist Assistant [phone]

## 2021-03-18 ENCOUNTER — Ambulatory Visit (INDEPENDENT_AMBULATORY_CARE_PROVIDER_SITE_OTHER): Payer: Medicare HMO

## 2021-03-18 DIAGNOSIS — E1129 Type 2 diabetes mellitus with other diabetic kidney complication: Secondary | ICD-10-CM

## 2021-03-18 DIAGNOSIS — E785 Hyperlipidemia, unspecified: Secondary | ICD-10-CM

## 2021-03-18 DIAGNOSIS — E1169 Type 2 diabetes mellitus with other specified complication: Secondary | ICD-10-CM | POA: Diagnosis not present

## 2021-03-18 DIAGNOSIS — R809 Proteinuria, unspecified: Secondary | ICD-10-CM | POA: Diagnosis not present

## 2021-03-18 NOTE — Progress Notes (Signed)
Chronic Care Management Pharmacy Note  03/18/2021 Name:  Lori Potts MRN:  [id number] DOB:  1946/04/06  Summary:  Patient presents for CCM follow-up. She reports she has had struggles with shortness of breath and weakness in her legs which has been ongoing for a few months. She thinks her symptoms may be coming from when she had Covid a few months ago.   She also endorses bone pain which she attributes to her rosuvastatin.   Recommendations/Changes made from today's visit: START CoQ10 100 mg daily  Plan: CPP follow-up in 3 months   Subjective: Lori Potts is an 75 y.o. year old female who is a primary patient of Delsa Grana, [place].  The CCM team was consulted for assistance with disease management and care coordination needs.    Engaged with patient by telephone for follow up visit in response to provider referral for pharmacy case management and/or care coordination services.   Consent to Services:  The patient was given information about Chronic Care Management services, agreed to services, and gave verbal consent prior to initiation of services.  Please see initial visit note for detailed documentation.   Patient Care Team: Delsa Grana, PA-C as PCP - General (Family Medicine) Germaine Pomfret, [facility] (Pharmacist) Thornton Park, MD as Referring Physician (Orthopedic Surgery) Levy Pupa, PA-C as Physician Assistant (Pain Medicine)  Recent office visits: 01/19/21: Video visit with Kathrine Haddock, NP for UTI.  12/29/20: Patient presented to Delsa Grana, PA-C for UTI. cephalexin 500 mg three times daily  12/16/20: Patient presented to Dr. Ancil Boozer for sinusitis exacerbation. Azelastine 0.1% 1 spray. Benzonatate, Flonase, montelukast 10 mg daily.   Recent consult visits: None in past 6 months.  Hospital visits: 01/16/21: Patient presented to ED for respiratory illness.    Objective:  Lab Results  Component Value Date   CREATININE 0.64 11/25/2020   BUN 15  11/25/2020   GFRNONAA >60 09/14/2020   GFRAA 101 02/18/2020   NA 141 11/25/2020   K 4.4 11/25/2020   CALCIUM 9.8 11/25/2020   CO2 30 11/25/2020   GLUCOSE 113 (H) 11/25/2020    Lab Results  Component Value Date/Time   HGBA1C 7.2 (A) 11/25/2020 08:04 AM   HGBA1C 7.2 (H) 11/14/2019 12:06 PM   HGBA1C 6.6 03/31/2019 12:00 AM   HGBA1C 6.3 (H) 05/09/2018 09:50 AM   MICROALBUR 5.2 05/14/2019 12:00 AM   MICROALBUR 3.8 05/09/2018 09:50 AM   MICROALBUR 20 07/04/2016 10:36 AM    Last diabetic Eye exam:  Lab Results  Component Value Date/Time   HMDIABEYEEXA No Retinopathy 06/02/2020 12:00 AM    Last diabetic Foot exam: No results found for: HMDIABFOOTEX   Lab Results  Component Value Date   CHOL 238 (H) 11/25/2020   HDL 61 11/25/2020   LDLCALC 154 (H) 11/25/2020   TRIG 112 11/25/2020   CHOLHDL 3.9 11/25/2020    Hepatic Function Latest Ref Rng & Units 11/25/2020 02/18/2020 11/14/2019  Total Protein 6.1 - 8.1 g/dL 7.2 6.6 6.7  Albumin 3.5 - 5.0 g/dL - - -  AST 10 - 35 U/L _0 ALT 6 - 29 U/L _1 Alk Phosphatase 38 - 126 U/L - - -  Total Bilirubin 0.2 - 1.2 mg/dL 0.4 0.5 0.5    Lab Results  Component Value Date/Time   TSH 2.00 02/18/2020 09:37 AM   TSH 0.71 11/14/2019 12:06 PM   FREET4 1.1 02/18/2020 09:37 AM    CBC Latest Ref Rng & Units 01/19/2021  09/14/2020 02/18/2020  WBC 3.8 - 10.8 Thousand/uL 5.9 5.3 4.2  Hemoglobin 11.7 - 15.5 g/dL 11.7 13.0 12.3  Hematocrit 35.0 - 45.0 % 36.3 39.3 38.1  Platelets 140 - 400 Thousand/uL 531(H) 414(H) 361    Lab Results  Component Value Date/Time   VD25OH 24 (L) 05/09/2018 09:50 AM   VD25OH 25 (L) 09/11/2017 02:09 PM    Clinical ASCVD: No  The 10-year ASCVD risk score Mikey Bussing DC Jr., et al., 2013) is: 43.6%   Values used to calculate the score:     Age: 75 years     Sex: Female     Is Non-Hispanic African American: Yes     Diabetic: Yes     Tobacco smoker: No     Systolic Blood Pressure: 622 mmHg     Is BP treated:  Yes     HDL Cholesterol: 61 mg/dL     Total Cholesterol: 238 mg/dL    Depression screen [facility] 2/9 01/19/2021 12/29/2020 12/16/2020  Decreased Interest 0 0 0  Down, Depressed, Hopeless 0 0 0  PHQ - 2 Score 0 0 0  Altered sleeping 0 0 0  Tired, decreased energy 0 0 0  Change in appetite 0 0 0  Feeling bad or failure about yourself  0 0 0  Trouble concentrating 0 0 0  Moving slowly or fidgety/restless 0 0 0  Suicidal thoughts 0 0 0  PHQ-9 Score 0 0 0  Difficult doing work/chores Not difficult at all Not difficult at all Not difficult at all  Some recent data might be hidden     Social History   Tobacco Use  Smoking Status Former   Types: Cigarettes   Quit date: 2006   Years since quitting: 16.5  Smokeless Tobacco Never  Tobacco Comments   quit smoking 65yr ago   BP Readings from Last 3 Encounters:  01/16/21 (!) 142/71  12/29/20 130/72  11/25/20 124/82   Pulse Readings from Last 3 Encounters:  01/16/21 (!) 106  12/29/20 72  11/25/20 86   Wt Readings from Last 3 Encounters:  01/16/21 185 lb (83.9 kg)  12/29/20 183 lb 9.6 oz (83.3 kg)  11/25/20 184 lb (83.5 kg)   BMI Readings from Last 3 Encounters:  01/16/21 32.26 kg/m  12/29/20 32.01 kg/m  11/25/20 32.08 kg/m    Assessment/Interventions: Review of patient past medical history, allergies, medications, health status, including review of consultants reports, laboratory and other test data, was performed as part of comprehensive evaluation and provision of chronic care management services.   SDOH:  (Social Determinants of Health) assessments and interventions performed: Yes SDOH Interventions    Flowsheet Row Most Recent Value  SDOH Interventions   Financial Strain Interventions Intervention Not Indicated      SDOH Screenings   Alcohol Screen: Low Risk    Last Alcohol Screening Score (AUDIT): 0  Depression (PHQ2-9): Low Risk    PHQ-2 Score: 0  Financial Resource Strain: Low Risk    Difficulty of Paying  Living Expenses: Not hard at all  Food Insecurity: No Food Insecurity   Worried About RCharity fundraiserin the Last Year: Never true   Ran Out of Food in the Last Year: Never true  Housing: Low Risk    Last Housing Risk Score: 0  Physical Activity: Inactive   Days of Exercise per Week: 0 days   Minutes of Exercise per Session: 0 min  Social Connections: Socially Integrated   Frequency of Communication with Friends and  Family: More than three times a week   Frequency of Social Gatherings with Friends and Family: Twice a week   Attends Religious Services: More than 4 times per year   Active Member of [employer] or Organizations: Yes   Attends Archivist Meetings: 1 to 4 times per year   Marital Status: Married  Stress: No Stress Concern Present   Feeling of Stress : Not at all  Tobacco Use: Medium Risk   Smoking Tobacco Use: Former   Smokeless Tobacco Use: Never  Transportation Needs: No Data processing manager (Medical): No   Lack of Transportation (Non-Medical): No    CCM Care Plan  Allergies  Allergen Reactions   Metformin And Related Diarrhea   Adhesive [Tape] Rash and Other (See Comments)    Regular tape is ok, allergy is to paper tape    Medications Reviewed Today     Reviewed by Germaine Pomfret, [facility] (Pharmacist) on 03/18/21 at 1115  Med List Status: <None>   Medication Order Taking? Sig Documenting Provider Last Dose Status Informant  albuterol (VENTOLIN HFA) 108 (90 Base) MCG/ACT inhaler [id number] No Inhale 1 puff into the lungs every 6 (six) hours as needed for wheezing or shortness of breath. Hubbard Hartshorn, [name] Taking Active Self  ascorbic acid (VITAMIN C) 500 MG tablet [id number] No Take 500 mg by mouth daily. [provider] Taking Active Self  aspirin 81 MG EC tablet [id number] No Take 1 tablet by mouth daily. [provider] Taking Active   Discontinued 03/18/21 1114 (Change in therapy)   azelastine (ASTELIN)  0.1 % nasal spray [id number] No PLACE 1 SPRAY INTO BOTH NOSTRILS 2 (TWO) TIMES DAILY. USE IN EACH NOSTRIL AS DIRECTED Steele Sizer, MD Taking Active   azithromycin (ZITHROMAX) 250 MG tablet [id number]  Take 1 tablet (250 mg total) by mouth daily. Take first 2 tablets together, then 1 every day until finished. Danton Clap, PA-C  Active   benzonatate (TESSALON) 100 MG capsule [id number] No Take 1-2 capsules (100-200 mg total) by mouth 2 (two) times daily as needed. Steele Sizer, MD Taking Active   Blood Glucose Monitoring Suppl (TRUE METRIX AIR GLUCOSE METER) DEVI [id number] No True Metrix Glucose Meter [provider] Taking Active   chlorpheniramine-HYDROcodone Amanda Cockayne PENNKINETIC ER) 10-8 MG/5ML SUER [id number] No Take 5 mLs by mouth every 12 (twelve) hours as needed. Steele Sizer, MD Taking Active   citalopram (CELEXA) 20 MG tablet [id number] No TAKE 1 TABLET DAILY FOR NERVES, STRESS, ANXIETY SYMPTOMS (DOSE CHANGE) Delsa Grana, PA-C Taking Active   Dulaglutide (TRULICITY) 1.88 CZ/6.6AY SOPN [id number] No Inject 0.75 mg into the skin once a week. Delsa Grana, PA-C Taking Active Self           Med Note Wilmon Pali, MELISSA R   Thu Sep 03, 2020  2:21 PM) Tuesdays   esomeprazole (NEXIUM) 40 MG capsule [id number] No TAKE 1 CAPSULE (40 MG TOTAL) BY MOUTH DAILY. FOLLOW UP IN OFFICE FOR MORE REFILLS Delsa Grana, PA-C Taking Active   fluticasone (FLONASE) 50 MCG/ACT nasal spray [id number] No PLACE 2 SPRAYS INTO BOTH NOSTRILS EVERY EVENING. Steele Sizer, MD Taking Active   fluticasone furoate-vilanterol (BREO ELLIPTA) 100-25 MCG/INH AEPB [id number] No Inhale 1 puff into the lungs daily. [provider] Taking Active Self  ipratropium-albuterol (DUONEB) 0.5-2.5 (3) MG/3ML SOLN [id number] No CAN TAKE 3ML EVERY 20 MINUTES FOR 2 DOSES AS NEEDED FOR WHEEZING  Patient taking differently: Inhale 3 mLs into the lungs every  6 (six) hours as needed (SOB).   Delsa Grana, PA-C Taking Active    Lancets 33G MISC [id number] No  [provider] Taking Active   levocetirizine (XYZAL) 5 MG tablet [id number] No Take 1 tablet (5 mg total) by mouth every evening. Hubbard Hartshorn, [name] Taking Active   losartan (COZAAR) 25 MG tablet [id number] No TAKE 1 TABLET EVERY DAY  Patient taking differently: Take 25 mg by mouth daily.   Delsa Grana, PA-C Taking Active   meloxicam (MOBIC) 7.5 MG tablet [id number] No Mobic 7.5 mg tablet  Take 1 tablet twice a day by oral route. [provider] Taking Active   methocarbamol (ROBAXIN) 500 MG tablet [id number] No Take 500 mg by mouth every 8 (eight) hours as needed for muscle spasms. [provider] Taking Active   montelukast (SINGULAIR) 10 MG tablet [id number] No Take 1 tablet (10 mg total) by mouth at bedtime. Steele Sizer, MD Taking Active   Multiple Vitamins-Minerals (MULTIVITAMIN WITH MINERALS) tablet [id number] No Take 1 tablet by mouth daily. [provider] Taking Active Self  oxyCODONE-acetaminophen (PERCOCET) 10-325 MG tablet [id number] No Take 1 tablet by mouth 5 (five) times daily.  Levy Pupa, PA-C Taking Active Self           Med Note (JEFFRIES, HELEN G   Wed Nov 25, 2020  8:00 AM) prn  propranolol (INDERAL) 10 MG tablet [id number] No TAKE 1 TABLET BY MOUTH THREE TIMES A DAY  Patient taking differently: Take 10 mg by mouth 2 (two) times daily.   Delsa Grana, PA-C Taking Active   rizatriptan (MAXALT-MLT) 5 MG disintegrating tablet [id number] No Take at first sign of headache. May repeat in 2 hours if needed Pollak, Adriana M, PA-C Taking Active   rosuvastatin (CRESTOR) 40 MG tablet [id number] No Take 1 tablet (40 mg total) by mouth at bedtime. Delsa Grana, PA-C Taking Active   sodium chloride (OCEAN) 0.65 % SOLN nasal spray [id number] No Place 1 spray into both nostrils 2 (two) times daily as needed for congestion. 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[place]) 12/03/2018   Microalbuminuria 01/22/2018   Anemia 09/22/2017   S/P knee replacement 07/28/2017   Allergic rhinitis 05/26/2017   Vitamin D deficiency 05/19/2017   Chronic nonintractable headache 05/18/2017   Special screening for malignant neoplasms, colon    Gastroesophageal reflux disease    Gastritis without bleeding    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[place]) 03/02/2017   Insomnia 11/29/2016   Chronic right-sided low back pain with right-sided sciatica 04/19/2016   Vasomotor flushing 04/19/2016   Abdominal wall pain in right flank 03/29/2016   Diverticulosis 02/24/2016   Hypercholesterolemia 06/16/2015   FHx: migraine headaches 04/13/2015   Chronic pain 04/13/2015   Neck pain 02/26/2015   Status post lumbar surgery 02/26/2015   Abnormal ECG 01/27/2015   Angina pectoris ([place]) 01/27/2015   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[place]) 01/27/2015   Essential (primary) hypertension 01/27/2015    Immunization History  Administered Date(s) Administered   Fluad Quad(high Dose 65+) 05/08/2019   Influenza, High Dose Seasonal PF 06/10/2015, 06/20/2016, 05/09/2017, 05/09/2018, 04/17/2020  PFIZER(Purple Top)SARS-COV-2 Vaccination 09/11/2019, 10/02/2019, 06/08/2020, 12/29/2020   Pneumococcal Conjugate-13 11/28/2013   Pneumococcal Polysaccharide-23 03/16/2018   Tdap 05/08/2019    Conditions to be addressed/monitored:  Hypertension, Hyperlipidemia, Diabetes, Coronary Artery Disease, GERD, and Allergic Rhinitis  Care Plan : General Pharmacy (Adult)  Updates made by Germaine Pomfret, RPH since 03/18/2021 12:00 AM     Problem: Hypertension, Hyperlipidemia, Diabetes,  Coronary Artery Disease, GERD, and Allergic Rhinitis   Priority: High     Long-Range Goal: Patient-Specific Goal   Start Date: 03/18/2021  Expected End Date: 03/18/2022  This Visit's Progress: On track  Priority: High  Note:   Current Barriers:  Unable to independently afford treatment regimen Unable to achieve control of cholesterol   Pharmacist Clinical Goal(s):  Patient will achieve control of cholesterol as evidenced by LDL less than 100 maintain control of diabetes as evidenced by A1c less than 8%  through collaboration with PharmD and provider.   Interventions: 1:1 collaboration with Delsa Grana, PA-C regarding development and update of comprehensive plan of care as evidenced by provider attestation and co-signature Inter-disciplinary care team collaboration (see longitudinal plan of care) Comprehensive medication review performed; medication list updated in electronic medical record  Hypertension (BP goal <140/90) -Controlled -Current treatment: Losartan 25 mg daily  Propanolol 10 mg twice daily  -Medications previously tried: NA  -Current home readings: NA -Denies hypotensive/hypertensive symptoms -Counseled to monitor BP at home weekly, document, and provide log at future appointments -Recommended to continue current medication  Hyperlipidemia: (LDL goal < 100) -Uncontrolled -Current treatment: Rosuvastatin 40 mg daily  -Medications previously tried: NA  -Soreness all over body, worst in hips, spine. Started 3 months ago, complicated by patient's chronic sciatic pain. Slightly better since switching from atorvastatin to rosuvastatin.  -Educated on Strategies to manage statin-induced myalgias; -START CoQ10 100 mg daily   Diabetes (A1c goal <8%) -Controlled -Current medications: Trulicity 1.44 mg weekly Fridays  -Medications previously tried: NA  -Current home glucose readings fasting glucose: 90-110 -Reports hypoglycemic symptoms: Felt nervous -Recommended to  continue current medication  Patient Goals/Self-Care Activities Patient will:  - check glucose daily before breakfast, document, and provide at future appointments check blood pressure weekly, document, and provide at future appointments  Follow Up Plan: Telephone follow up appointment with care management team member scheduled for:  05/26/2021 at 8:15 AM       Medication Assistance:  Trulicity obtained through [place] [facility] medication assistance program.  Enrollment ends Dec 2022  Compliance/Adherence/Medication fill history: Care Gaps: Mammogram  Star-Rating Drugs: Rosuvastatin 80 mg last filled on 11/27/2020 for a 90-Day supply with [facility] Atorvastatin 80 mg last filled on 11/26/2020 for a 90-Day supply with [facility] Trulicity 8.18 mg last filled on 11/30/2020 for a 84-Day supply with [facility] Losartan 25 mg last filled on 09/15/2020 for a 90-Day supply with [facility]  Patient's preferred pharmacy is:  [id number] [place] [address]Phone: [phone]: [phone] [facility] [place]Phone: [phone]: [phone] [facility] ([address] [id number]: [phone]: [phone] [id number] [id number]: [phone]: [phone] pill box? Yes Pt endorses 100%  compliance  We discussed: Current pharmacy is preferred with insurance plan and patient is satisfied with pharmacy services Patient decided to: Continue current medication management strategy  Care Plan and Follow Up Patient Decision:  Patient agrees to Care Plan and Follow-up.  Plan: Telephone follow up appointment with care  management team member scheduled for:  05/26/2021 at [place], [place], [facility] [phone]

## 2021-03-18 NOTE — Patient Instructions (Signed)
Visit Information It was great speaking with you today!  Please let me know if you have any questions about our visit.   Goals Addressed             This Visit's Progress    Monitor and Manage My Blood Sugar-Diabetes Type 2       Timeframe:  Long-Range Goal Priority:  High Start Date: 03/18/21                            Expected End Date: 03/18/22                      Follow Up Date 05/21/2021   - check blood sugar at prescribed times - check blood sugar if I feel it is too high or too low - enter blood sugar readings and medication or insulin into daily log    Why is this important?   Checking your blood sugar at home helps to keep it from getting very high or very low.  Writing the results in a diary or log helps the doctor know how to care for you.  Your blood sugar log should have the time, date and the results.  Also, write down the amount of insulin or other medicine that you take.  Other information, like what you ate, exercise done and how you were feeling, will also be helpful.     Notes:         Patient Care Plan: General Pharmacy (Adult)     Problem Identified: Hypertension, Hyperlipidemia, Diabetes, Coronary Artery Disease, GERD, and Allergic Rhinitis   Priority: High     Long-Range Goal: Patient-Specific Goal   Start Date: 03/18/2021  Expected End Date: 03/18/2022  This Visit's Progress: On track  Priority: High  Note:   Current Barriers:  Unable to independently afford treatment regimen Unable to achieve control of cholesterol   Pharmacist Clinical Goal(s):  Patient will achieve control of cholesterol as evidenced by LDL less than 100 maintain control of diabetes as evidenced by A1c less than 8%  through collaboration with PharmD and provider.   Interventions: 1:1 collaboration with Danelle Berry, PA-C regarding development and update of comprehensive plan of care as evidenced by provider attestation and co-signature Inter-disciplinary care team  collaboration (see longitudinal plan of care) Comprehensive medication review performed; medication list updated in electronic medical record  Hypertension (BP goal <140/90) -Controlled -Current treatment: Losartan 25 mg daily  Propanolol 10 mg twice daily  -Medications previously tried: NA  -Current home readings: NA -Denies hypotensive/hypertensive symptoms -Counseled to monitor BP at home weekly, document, and provide log at future appointments -Recommended to continue current medication  Hyperlipidemia: (LDL goal < 100) -Uncontrolled -Current treatment: Rosuvastatin 40 mg daily  -Medications previously tried: NA  -Soreness all over body, worst in hips, spine. Started 3 months ago, complicated by patient's chronic sciatic pain. Slightly better since switching from atorvastatin to rosuvastatin.  -Educated on Strategies to manage statin-induced myalgias; -START CoQ10 100 mg daily   Diabetes (A1c goal <8%) -Controlled -Current medications: Trulicity 0.75 mg weekly Fridays  -Medications previously tried: NA  -Current home glucose readings fasting glucose: 90-110 -Reports hypoglycemic symptoms: Felt nervous -Recommended to continue current medication  Patient Goals/Self-Care Activities Patient will:  - check glucose daily before breakfast, document, and provide at future appointments check blood pressure weekly, document, and provide at future appointments  Follow Up Plan: Telephone follow up appointment with care  management team member scheduled for:  05/26/2021 at 8:15 AM      Patient agreed to services and verbal consent obtained.   The patient verbalized understanding of instructions, educational materials, and care plan provided today and declined offer to receive copy of patient instructions, educational materials, and care plan.   Lori Potts, Lori Potts Clinical Pharmacist [facility] [phone]

## 2021-03-19 ENCOUNTER — Other Ambulatory Visit: Payer: Self-pay | Admitting: Family Medicine

## 2021-03-19 DIAGNOSIS — J4521 Mild intermittent asthma with (acute) exacerbation: Secondary | ICD-10-CM

## 2021-03-22 ENCOUNTER — Other Ambulatory Visit: Payer: Self-pay | Admitting: Emergency Medicine

## 2021-03-22 ENCOUNTER — Other Ambulatory Visit: Payer: Self-pay | Admitting: Family Medicine

## 2021-03-22 DIAGNOSIS — J4521 Mild intermittent asthma with (acute) exacerbation: Secondary | ICD-10-CM

## 2021-03-22 DIAGNOSIS — K219 Gastro-esophageal reflux disease without esophagitis: Secondary | ICD-10-CM

## 2021-03-23 MED ORDER — ESOMEPRAZOLE MAGNESIUM 40 MG PO CPDR: 40.0000 mg | DELAYED_RELEASE_CAPSULE | Freq: Every day | ORAL | 3 refills | Status: DC

## 2021-03-25 ENCOUNTER — Ambulatory Visit (INDEPENDENT_AMBULATORY_CARE_PROVIDER_SITE_OTHER): Payer: Medicare HMO | Admitting: Unknown Physician Specialty

## 2021-03-25 ENCOUNTER — Other Ambulatory Visit: Payer: Self-pay

## 2021-03-25 ENCOUNTER — Encounter: Payer: Self-pay | Admitting: Unknown Physician Specialty

## 2021-03-25 VITALS — BP 138/76 | HR 78 | Temp 97.9°F | Resp 16 | Ht 64.0 in | Wt 180.6 lb

## 2021-03-25 DIAGNOSIS — Z7189 Other specified counseling: Secondary | ICD-10-CM | POA: Diagnosis not present

## 2021-03-25 DIAGNOSIS — M545 Low back pain, unspecified: Secondary | ICD-10-CM | POA: Diagnosis not present

## 2021-03-25 DIAGNOSIS — N3000 Acute cystitis without hematuria: Secondary | ICD-10-CM | POA: Diagnosis not present

## 2021-03-25 DIAGNOSIS — N3001 Acute cystitis with hematuria: Secondary | ICD-10-CM | POA: Diagnosis not present

## 2021-03-25 LAB — POCT URINALYSIS DIPSTICK
Bilirubin, UA: NEGATIVE
Glucose, UA: NEGATIVE
Ketones, UA: NEGATIVE
Protein, UA: POSITIVE — AB
Spec Grav, UA: 1.02 (ref 1.010–1.025)
Urobilinogen, UA: 0.2 E.U./dL
pH, UA: 5 (ref 5.0–8.0)

## 2021-03-25 MED ORDER — CIPROFLOXACIN HCL 250 MG PO TABS: 250.0000 mg | ORAL_TABLET | Freq: Two times a day (BID) | ORAL | 0 refills | Status: AC

## 2021-03-25 NOTE — Assessment & Plan Note (Addendum)
Pt states she wishes to not be resuscitated in case her heart stops. Her husband is the Clinical research associate.  Encouraged to have a secondary Management consultant.  Does not want a DNR form as family knows wishes.

## 2021-03-25 NOTE — Progress Notes (Signed)
BP 138/76   Pulse 78   Temp 97.9 F (36.6 C) (Oral)   Resp 16   Ht 5\' 4"  (1.626 m)   Wt 180 lb 9.6 oz (81.9 kg)   SpO2 97%   BMI 31.00 kg/m    Subjective:    Patient ID: , female    DOB: 06/26/1946, 75 y.o.   MRN: [id number]  HPI: Lori Potts is a 75 y.o. female  Chief Complaint  Patient presents with   Urinary Tract Infection   Urinary Tract Infection  This is a recurrent problem. The current episode started in the past 7 days. The problem occurs every urination. The problem has been gradually worsening. The quality of the pain is described as aching. The pain is severe. There has been no fever. The fever has been present for Less than 1 day. She is Not sexually active. There is A history of pyelonephritis. Associated symptoms include vomiting. Pertinent negatives include no chills, discharge, flank pain or nausea. Associated symptoms comments: Back pain. She has tried nothing for the symptoms. Her past medical history is significant for recurrent UTIs and a urological procedure.    Relevant past medical, surgical, family and social history reviewed and updated as indicated. Interim medical history since our last visit reviewed. Allergies and medications reviewed and updated.  Review of Systems  Constitutional:  Negative for chills.  Gastrointestinal:  Positive for vomiting. Negative for nausea.  Genitourinary:  Negative for flank pain.   Per HPI unless specifically indicated above     Objective:    BP 138/76   Pulse 78   Temp 97.9 F (36.6 C) (Oral)   Resp 16   Ht 5\' 4"  (1.626 m)   Wt 180 lb 9.6 oz (81.9 kg)   SpO2 97%   BMI 31.00 kg/m   Wt Readings from Last 3 Encounters:  03/25/21 180 lb 9.6 oz (81.9 kg)  01/16/21 185 lb (83.9 kg)  12/29/20 183 lb 9.6 oz (83.3 kg)    Physical Exam Constitutional:      General: She is not in acute distress.    Appearance: Normal appearance. She is well-developed.  HENT:     Head: Normocephalic and  atraumatic.  Eyes:     General: Lids are normal. No scleral icterus.       Right eye: No discharge.        Left eye: No discharge.     Conjunctiva/sclera: Conjunctivae normal.  Neck:     Vascular: No carotid bruit or JVD.  Cardiovascular:     Rate and Rhythm: Normal rate and regular rhythm.     Heart sounds: Normal heart sounds.  Pulmonary:     Effort: Pulmonary effort is normal. No respiratory distress.     Breath sounds: Normal breath sounds.  Abdominal:     General: There is no distension.     Palpations: Abdomen is soft. There is no hepatomegaly or splenomegaly.     Tenderness: There is abdominal tenderness in the left lower quadrant. There is no right CVA tenderness or left CVA tenderness.  Musculoskeletal:     Cervical back: Normal range of motion and neck supple.     Lumbar back: No swelling, edema, tenderness or bony tenderness. Decreased range of motion. Negative right straight leg raise test and negative left straight leg raise test.  Skin:    General: Skin is warm and dry.     Coloration: Skin is not pale.     Findings:  No rash.  Neurological:     Mental Status: She is alert and oriented to person, place, and time.  Psychiatric:        Behavior: Behavior normal.        Thought Content: Thought content normal.        Judgment: Judgment normal.    Results for orders placed or performed in visit on 03/25/21  POCT urinalysis dipstick  Result Value Ref Range   Color, UA gold    Clarity, UA cloudy    Glucose, UA Negative Negative   Bilirubin, UA negative    Ketones, UA negative    Spec Grav, UA 1.020 1.010 - 1.025   Blood, UA large    pH, UA 5.0 5.0 - 8.0   Protein, UA Positive (A) Negative   Urobilinogen, UA 0.2 0.2 or 1.0 E.U./dL   Nitrite, UA large    Leukocytes, UA Large (3+) (A) Negative   Appearance cloudy    Odor strong       Assessment & Plan:   Problem List Items Addressed This Visit       Unprioritized   Advanced care planning/counseling  discussion    Pt states she wishes to not be resuscitated in case her heart stops. Her husband is the Clinical research associate.  Encouraged to have a secondary Management consultant.  Does not want a DNR form as family knows wishes.         Other Visit Diagnoses     Acute cystitis without hematuria    -  Primary   3 + leukocytres and symptomatic. Complicated due to co-morbid conditions.  Will rx Cipro 250 mg BID for 5 days. Send urine for cx   Relevant Orders   POCT urinalysis dipstick (Completed)   Urine Culture   Acute midline low back pain without sciatica       ? related to UTI vs Musculoskeletal.  Treat for UTI and re-evaluate as needed        Follow up plan: Return Needs DM f/u.

## 2021-03-27 LAB — URINE CULTURE
MICRO NUMBER:: 12202981
SPECIMEN QUALITY:: ADEQUATE

## 2021-03-30 ENCOUNTER — Ambulatory Visit: Payer: Medicare HMO | Admitting: Family Medicine

## 2021-03-30 NOTE — Progress Notes (Deleted)
Name: Lori Potts   MRN: [id number]    DOB: Jan 26, 1946   Date:03/30/2021       Progress Note  Subjective  Chief Complaint  Fall  HPI  *** Patient Active Problem List   Diagnosis Date Noted   Neutropenia, unspecified type (HCC) 11/25/2020   Intractable migraine with aura without status migrainosus 04/18/2020   Body mass index (BMI) 31.0-31.9, adult 02/06/2020   Chronic left hip pain 12/26/2019   Anemia 09/22/2017   S/P knee replacement 07/28/2017   Allergic rhinitis 05/26/2017   Vitamin D deficiency 05/19/2017   Chronic nonintractable headache 05/18/2017   Advanced care planning/counseling discussion    Gastroesophageal reflux disease    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HCC) 03/02/2017   Insomnia 11/29/2016   Chronic right-sided low back pain with right-sided sciatica 04/19/2016   Abdominal wall pain in right flank 03/29/2016   Diverticulosis 02/24/2016   Hypercholesterolemia 06/16/2015   Chronic pain 04/13/2015   Neck pain 02/26/2015   Status post lumbar surgery 02/26/2015   Abnormal ECG 01/27/2015   Angina pectoris (HCC) 01/27/2015   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) 01/27/2015   Essential (primary) hypertension 01/27/2015    Past Surgical History:  Procedure Laterality Date   ABDOMINAL HYSTERECTOMY     ANTERIOR CERVICAL DECOMP/DISCECTOMY FUSION N/A 02/26/2015   Procedure: ANTERIOR CERVICAL DISCECTOMY FUSION C4-5 (1 LEVEL);  Surgeon: Venita Lick, MD;  Location: [facility] OR;  Service: Orthopedics;  Laterality: N/A;   ANTERIOR LAT LUMBAR FUSION N/A 03/21/2013   Procedure: ANTERIOR LATERAL LUMBAR FUSION 1 LEVEL/ XLIF L3-L4 ;  Surgeon: Venita Lick, MD;  Location: [facility] OR;  Service: Orthopedics;  Laterality: N/A;   APPENDECTOMY     AUGMENTATION MAMMAPLASTY Bilateral 1978   BACK SURGERY     BACK SURGERY     Lumbar fusion x 2   BREAST EXCISIONAL BIOPSY Right 1970   BREAST SURGERY  1990   Augementation   CARDIAC CATHETERIZATION  5/12   ef 55%    CARDIAC CATHETERIZATION  10/2010   [facility]; EF 55%   CARDIAC CATHETERIZATION Left 02/16/2016   Procedure: Left Heart Cath and Coronary Angiography;  Surgeon: Alwyn Pea, MD;  Location: [facility] INV[facility]IVE [facility];  Service: Cardiovascular;  Laterality: Left;   COLONOSCOPY     COLONOSCOPY WITH PROPOFOL N/A 04/10/2017   Procedure: COLONOSCOPY WITH PROPOFOL;  Surgeon: Midge Minium, MD;  Location: [facility];  Service: Gastroenterology;  Laterality: N/A;  Diabetic - insulin   ESOPHAGOGASTRODUODENOSCOPY     JOINT REPLACEMENT     KIDNEY SURGERY  1998   growth removed from left kidney    LEFT HEART CATH AND CORONARY ANGIOGRAPHY Left 09/10/2018   Procedure: LEFT HEART CATH AND CORONARY ANGIOGRAPHY;  Surgeon: Alwyn Pea, MD;  Location: [facility];  Service: Cardiovascular;  Laterality: Left;   pain stimulator     POSTERIOR CERVICAL FUSION/FORAMINOTOMY Right 03/21/2013   Procedure: POSTERIOR L2-3 RIGHT FORAMINOTOMY;  Surgeon: Venita Lick, MD;  Location: [facility] OR;  Service: Orthopedics;  Laterality: Right;   REVISION OF SCAR TISSUE RECTUS MUSCLE     SHOULDER ARTHROSCOPY WITH ROTATOR CUFF REPAIR AND SUBACROMIAL DECOMPRESSION Right 09/17/2020   Procedure: RIGHT SHOULDER ARTHROSCOPY WITH MINI-OPEN ROTATOR CUFF REPAIR, DISTAL CLAVICLE EXCISION, AND SUBACROMIAL DECOMPRESSION WITH BICEP TENDONESIS;  Surgeon: Juanell Fairly, MD;  Location: [facility] ORS;  Service: Orthopedics;  Laterality: Right;   SMALL BOWEL REPAIR     SPINAL CORD STIMULATOR BATTERY EXCHANGE N/A 10/17/2012   Procedure: SPINAL CORD  STIMULATOR BATTERY REMOVAL;  Surgeon: Venita Lickahari Brooks, MD;  Location: [facility] OR;  Service: Orthopedics;  Laterality: N/A;   SPINAL CORD STIMULATOR BATTERY EXCHANGE N/A 07/22/2015   Procedure: REIMPLANTATION OF SPINAL CORD STIMULATOR BATTERY ;  Surgeon: Venita Lickahari Brooks, MD;  Location: [facility] OR;  Service: Orthopedics;  Laterality: N/A;   TOTAL KNEE ARTHROPLASTY Left    TOTAL KNEE ARTHROPLASTY Right 07/28/2017    Procedure: RIGHT TOTAL KNEE ARTHROPLASTY;  Surgeon: Eugenia Mcalpineollins, Robert, MD;  Location: [facility];  Service: Orthopedics;  Laterality: Right;    Family History  Problem Relation Age of Onset   Heart attack Mother    Sarcoidosis Sister     Social History   Tobacco Use   Smoking status: Former    Types: Cigarettes    Quit date: 2006    Years since quitting: 16.6   Smokeless tobacco: Never   Tobacco comments:    quit smoking 7448yrs ago  Substance Use Topics   Alcohol use: No     Current Outpatient Medications:    albuterol (VENTOLIN HFA) 108 (90 Base) MCG/ACT inhaler, Inhale 1 puff into the lungs every 6 (six) hours as needed for wheezing or shortness of breath., Disp: 18 g, Rfl: 2   ascorbic acid (VITAMIN C) 500 MG tablet, Take 500 mg by mouth daily., Disp: , Rfl:    aspirin 81 MG EC tablet, Take 1 tablet by mouth daily., Disp: , Rfl:    azelastine (ASTELIN) 0.1 % nasal spray, PLACE 1 SPRAY INTO BOTH NOSTRILS 2 (TWO) TIMES DAILY. USE IN EACH NOSTRIL AS DIRECTED, Disp: 90 mL, Rfl: 1   Blood Glucose Monitoring Suppl (TRUE METRIX AIR GLUCOSE METER) DEVI, True Metrix Glucose Meter (Patient not taking: Reported on 03/25/2021), Disp: , Rfl:    chlorpheniramine-HYDROcodone (TUSSIONEX PENNKINETIC ER) 10-8 MG/5ML SUER, Take 5 mLs by mouth every 12 (twelve) hours as needed. (Patient not taking: Reported on 03/25/2021), Disp: 140 mL, Rfl: 0   ciprofloxacin (CIPRO) 250 MG tablet, Take 1 tablet (250 mg total) by mouth 2 (two) times daily for 5 days., Disp: 10 tablet, Rfl: 0   citalopram (CELEXA) 20 MG tablet, TAKE 1 TABLET DAILY FOR NERVES, STRESS, ANXIETY SYMPTOMS (DOSE CHANGE), Disp: 90 tablet, Rfl: 0   Dulaglutide (TRULICITY) 0.75 MG/0.5ML SOPN, Inject 0.75 mg into the skin once a week., Disp: 6 mL, Rfl: 1   esomeprazole (NEXIUM) 40 MG capsule, Take 1 capsule (40 mg total) by mouth daily. Follow up in office for more refills, Disp: 30 capsule, Rfl: 3   fluticasone (FLONASE) 50 MCG/ACT nasal spray, PLACE 2  SPRAYS INTO BOTH NOSTRILS EVERY EVENING., Disp: 48 mL, Rfl: 1   fluticasone furoate-vilanterol (BREO ELLIPTA) 100-25 MCG/INH AEPB, Inhale 1 puff into the lungs daily., Disp: , Rfl:    ipratropium-albuterol (DUONEB) 0.5-2.5 (3) MG/3ML SOLN, CAN TAKE 3ML EVERY 20 MINUTES FOR 2 DOSES AS NEEDED FOR WHEEZING (Patient taking differently: Inhale 3 mLs into the lungs every 6 (six) hours as needed (SOB).), Disp: 90 mL, Rfl: 0   Lancets 33G MISC, , Disp: , Rfl:    levocetirizine (XYZAL) 5 MG tablet, Take 1 tablet (5 mg total) by mouth every evening., Disp: 30 tablet, Rfl: 2   losartan (COZAAR) 25 MG tablet, TAKE 1 TABLET EVERY DAY (Patient taking differently: Take 25 mg by mouth daily.), Disp: 90 tablet, Rfl: 3   meloxicam (MOBIC) 7.5 MG tablet, Mobic 7.5 mg tablet  Take 1 tablet twice a day by oral route., Disp: , Rfl:    methocarbamol (  ROBAXIN) 500 MG tablet, Take 500 mg by mouth every 8 (eight) hours as needed for muscle spasms., Disp: , Rfl:    montelukast (SINGULAIR) 10 MG tablet, TAKE 1 TABLET BY MOUTH EVERYDAY AT BEDTIME, Disp: 90 tablet, Rfl: 0   Multiple Vitamins-Minerals (MULTIVITAMIN WITH MINERALS) tablet, Take 1 tablet by mouth daily., Disp: , Rfl:    oxyCODONE-acetaminophen (PERCOCET) 10-325 MG tablet, Take 1 tablet by mouth 5 (five) times daily. , Disp: , Rfl: 0   propranolol (INDERAL) 10 MG tablet, TAKE 1 TABLET BY MOUTH THREE TIMES A DAY, Disp: 270 tablet, Rfl: 3   rizatriptan (MAXALT-MLT) 5 MG disintegrating tablet, Take at first sign of headache. May repeat in 2 hours if needed, Disp: 10 tablet, Rfl: 0   rosuvastatin (CRESTOR) 40 MG tablet, Take 1 tablet (40 mg total) by mouth at bedtime., Disp: 90 tablet, Rfl: 0   sodium chloride (OCEAN) 0.65 % SOLN nasal spray, Place 1 spray into both nostrils 2 (two) times daily as needed for congestion., Disp: 60 mL, Rfl: 5  Allergies  Allergen Reactions   Metformin And Related Diarrhea   Adhesive [Tape] Rash and Other (See Comments)    Regular tape  is ok, allergy is to paper tape    I personally reviewed {Reviewed:[id number]} with the patient/caregiver today.   ROS  ***  Objective  There were no vitals filed for this visit.  There is no height or weight on file to calculate BMI.  Physical Exam ***  Recent Results (from the past 2160 hour(s))  Resp Panel by RT-PCR (Flu A&B, Covid) Nasopharyngeal Swab     Status: None   Collection Time: 01/16/21 12:24 PM   Specimen: Nasopharyngeal Swab; Nasopharyngeal(NP) swabs in vial transport medium  Result Value Ref Range   SARS Coronavirus 2 by RT PCR NEGATIVE NEGATIVE    Comment: (NOTE) SARS-CoV-2 target nucleic acids are NOT DETECTED.  The SARS-CoV-2 RNA is generally detectable in upper respiratory specimens during the acute phase of infection. The lowest concentration of SARS-CoV-2 viral copies this assay can detect is 138 copies/mL. A negative result does not preclude SARS-Cov-2 infection and should not be used as the sole basis for treatment or other patient management decisions. A negative result may occur with  improper specimen collection/handling, submission of specimen other than nasopharyngeal swab, presence of viral mutation(s) within the areas targeted by this assay, and inadequate number of viral copies(<138 copies/mL). A negative result must be combined with clinical observations, patient history, and epidemiological information. The expected result is Negative.  Fact Sheet for Patients:  BloggerCourse.com  Fact Sheet for Healthcare Providers:  SeriousBroker.it  This test is no t yet approved or cleared by the [place] FDA and  has been authorized for detection and/or diagnosis of SARS-CoV-2 by FDA under an Emergency Use Authorization (EUA). This EUA will remain  in effect (meaning this test can be used) for the duration of the COVID-19 declaration under Section 564(b)(1) of the Act, 21 U.S.C.section  360bbb-3(b)(1), unless the authorization is terminated  or revoked sooner.       Influenza A by PCR NEGATIVE NEGATIVE   Influenza B by PCR NEGATIVE NEGATIVE    Comment: (NOTE) The Xpert Xpress SARS-CoV-2/FLU/RSV plus assay is intended as an aid in the diagnosis of influenza from Nasopharyngeal swab specimens and should not be used as a sole basis for treatment. Nasal washings and aspirates are unacceptable for Xpert Xpress SARS-CoV-2/FLU/RSV testing.  Fact Sheet for Patients: BloggerCourse.com  Fact Sheet for Healthcare Providers:  SeriousBroker.it  This test is not yet approved or cleared by the [place] and has been authorized for detection and/or diagnosis of SARS-CoV-2 by FDA under an Emergency Use Authorization (EUA). This EUA will remain in effect (meaning this test can be used) for the duration of the COVID-19 declaration under Section 564(b)(1) of the Act, 21 U.S.C. section 360bbb-3(b)(1), unless the authorization is terminated or revoked.  Performed at [facility], [address] [place], [place] [address]   CBC w/Diff/Platelet     Status: Abnormal   Collection Time: 01/19/21 12:00 AM  Result Value Ref Range   WBC 5.9 3.8 - 10.8 Thousand/uL   RBC 4.58 3.80 - 5.10 Million/uL   Hemoglobin 11.7 11.7 - 15.5 g/dL   HCT 60.4 54.0 - 98.1 %   MCV 79.3 (L) 80.0 - 100.0 fL   MCH 25.5 (L) 27.0 - 33.0 pg   MCHC 32.2 32.0 - 36.0 g/dL   RDW 19.1 (H) 47.8 - 29.5 %   Platelets 531 (H) 140 - 400 Thousand/uL   MPV 10.9 7.5 - 12.5 fL   Neutro Abs 3,882 1,500 - 7,800 cells/uL   Lymphs Abs 1,245 850 - 3,900 cells/uL   Absolute Monocytes 431 200 - 950 cells/uL   Eosinophils Absolute 289 15 - 500 cells/uL   Basophils Absolute 53 0 - 200 cells/uL   Neutrophils Relative % 65.8 %   Total Lymphocyte 21.1 %   Monocytes Relative 7.3 %   Eosinophils Relative 4.9 %   Basophils Relative 0.9 %  Urine Microscopic      Status: Abnormal   Collection Time: 01/19/21 12:00 AM  Result Value Ref Range   WBC, UA 0-5 0 - 5 /HPF   RBC / HPF 0-2 0 - 2 /HPF   Squamous Epithelial / LPF 10-20 (A) < OR = 5 /HPF   Bacteria, UA FEW (A) NONE SEEN /HPF   Note      Comment: This urine was analyzed for the presence of WBC,  RBC, bacteria, casts, and other formed elements.  Only those elements seen were reported. . .   Urine Culture     Status: None   Collection Time: 01/19/21 12:00 AM  Result Value Ref Range   MICRO NUMBER: [id number]    SPECIMEN QUALITY: Adequate    Sample Source URINE    STATUS: FINAL    ISOLATE 1:      Mixed genital flora isolated. These superficial bacteria are not indicative of a urinary tract infection. No further organism identification is warranted on this specimen. If clinically indicated, recollect clean-catch, mid-stream urine and transfer  immediately to Urine Culture Transport Tube.   POCT urinalysis dipstick     Status: Abnormal   Collection Time: 01/19/21  1:41 PM  Result Value Ref Range   Color, UA gold    Clarity, UA cloudy    Glucose, UA Negative Negative   Bilirubin, UA negtaive    Ketones, UA trace    Spec Grav, UA 1.020 1.010 - 1.025   Blood, UA trace    pH, UA 5.0 5.0 - 8.0   Protein, UA Positive (A) Negative   Urobilinogen, UA 0.2 0.2 or 1.0 E.U./dL   Nitrite, UA negative    Leukocytes, UA Trace (A) Negative   Appearance cloudy    Odor strong   POCT urinalysis dipstick     Status: Abnormal   Collection Time: 03/25/21  8:56 AM  Result Value Ref Range   Color, UA  gold    Clarity, UA cloudy    Glucose, UA Negative Negative   Bilirubin, UA negative    Ketones, UA negative    Spec Grav, UA 1.020 1.010 - 1.025   Blood, UA large    pH, UA 5.0 5.0 - 8.0   Protein, UA Positive (A) Negative   Urobilinogen, UA 0.2 0.2 or 1.0 E.U./dL   Nitrite, UA large    Leukocytes, UA Large (3+) (A) Negative   Appearance cloudy    Odor strong   Urine Culture     Status: Abnormal    Collection Time: 03/25/21  9:20 AM   Specimen: Urine  Result Value Ref Range   MICRO NUMBER: [id number]    SPECIMEN QUALITY: Adequate    Sample Source URINE    STATUS: FINAL    ISOLATE 1: Escherichia coli (A)     Comment: Greater than 100,000 CFU/mL of Escherichia coli      Susceptibility   Escherichia coli - URINE CULTURE, REFLEX    AMOX/CLAVULANIC 4 Sensitive     AMPICILLIN 4 Sensitive     AMPICILLIN/SULBACTAM <=2 Sensitive     CEFAZOLIN* <=4 Not Reportable      * For infections other than uncomplicated UTI caused by E. coli, K. pneumoniae or P. mirabilis: Cefazolin is resistant if MIC > or = 8 mcg/mL. (Distinguishing susceptible versus intermediate for isolates with MIC < or = 4 mcg/mL requires additional testing.) For uncomplicated UTI caused by E. coli, K. pneumoniae or P. mirabilis: Cefazolin is susceptible if MIC <32 mcg/mL and predicts susceptible to the oral agents cefaclor, cefdinir, cefpodoxime, cefprozil, cefuroxime, cephalexin and loracarbef.     CEFTAZIDIME <=1 Sensitive     CEFEPIME <=1 Sensitive     CEFTRIAXONE <=1 Sensitive     CIPROFLOXACIN <=0.25 Sensitive     LEVOFLOXACIN <=0.12 Sensitive     GENTAMICIN <=1 Sensitive     IMIPENEM <=0.25 Sensitive     NITROFURANTOIN <=16 Sensitive     PIP/TAZO <=4 Sensitive     TOBRAMYCIN <=1 Sensitive     TRIMETH/SULFA* <=20 Sensitive      * For infections other than uncomplicated UTI caused by E. coli, K. pneumoniae or P. mirabilis: Cefazolin is resistant if MIC > or = 8 mcg/mL. (Distinguishing susceptible versus intermediate for isolates with MIC < or = 4 mcg/mL requires additional testing.) For uncomplicated UTI caused by E. coli, K. pneumoniae or P. mirabilis: Cefazolin is susceptible if MIC <32 mcg/mL and predicts susceptible to the oral agents cefaclor, cefdinir, cefpodoxime, cefprozil, cefuroxime, cephalexin and loracarbef. Legend: S = Susceptible  I = Intermediate R = Resistant  NS = Not susceptible *  = Not tested  NR = Not reported **NN = See antimicrobic comments     Diabetic Foot Exam: Diabetic Foot Exam - Simple   No data filed    ***  PHQ2/9: Depression screen [facility] 2/9 03/25/2021 01/19/2021 12/29/2020 12/16/2020 11/25/2020  Decreased Interest 0 0 0 0 0  Down, Depressed, Hopeless 0 0 0 0 0  PHQ - 2 Score 0 0 0 0 0  Altered sleeping 0 0 0 0 0  Tired, decreased energy 0 0 0 0 0  Change in appetite 0 0 0 0 0  Feeling bad or failure about yourself  0 0 0 0 0  Trouble concentrating 0 0 0 0 0  Moving slowly or fidgety/restless 0 0 0 0 0  Suicidal thoughts 0 0 0 0 0  PHQ-9 Score 0 0 0  0 0  Difficult doing work/chores Not difficult at all Not difficult at all Not difficult at all Not difficult at all Not difficult at all  Some recent data might be hidden    phq 9 is {gen pos [id number]} ***  Fall Risk: Fall Risk  03/25/2021 01/19/2021 12/29/2020 12/16/2020 11/25/2020  Falls in the past year? 1 1 0 1 1  Comment - - - - -  Number falls in past yr: 1 1 0 1 1  Injury with Fall? 1 1 0 1 1  Risk for fall due to : History of fall(s) History of fall(s);Impaired balance/gait - History of fall(s) History of fall(s)  Follow up Falls evaluation completed Falls evaluation completed - Falls evaluation completed Falls evaluation completed   ***   Functional Status Survey:   ***   Assessment & Plan  *** There are no diagnoses linked to this encounter.

## 2021-03-31 ENCOUNTER — Other Ambulatory Visit: Payer: Self-pay

## 2021-03-31 ENCOUNTER — Encounter: Payer: Self-pay | Admitting: Family Medicine

## 2021-03-31 ENCOUNTER — Telehealth (INDEPENDENT_AMBULATORY_CARE_PROVIDER_SITE_OTHER): Payer: Medicare HMO | Admitting: Family Medicine

## 2021-03-31 DIAGNOSIS — W19XXXA Unspecified fall, initial encounter: Secondary | ICD-10-CM

## 2021-03-31 DIAGNOSIS — M545 Low back pain, unspecified: Secondary | ICD-10-CM

## 2021-03-31 NOTE — Patient Instructions (Signed)
It was great to see you!  Our plans for today:  - We are getting an xray of your back. We will call you with these results.  - If your headache changes, becomes worse or if you develop nausea, vomiting, confusion, difficulty walking or speaking, come back to see [place] for an inperson appointment or go to the ED after hours.   Take care and seek immediate care sooner if you develop any concerns.   Dr. Linwood Dibbles

## 2021-03-31 NOTE — Progress Notes (Signed)
Virtual Visit via Video Note  I connected with Lori Potts on 03/31/21 at  9:40 AM EDT by a video enabled telemedicine application and verified that I am speaking with the correct person using two identifiers.  Location: Patient: home Provider: [facility]   I discussed the limitations of evaluation and management by telemedicine and the availability of in person appointments. The patient expressed understanding and agreed to proceed.  History of Present Illness:  Fall - fell last Thursday while taking trash out, tripped on nail, fell onto R side of face and shoulder - no LOC - with subsequent pain to R face, orbit, lower back - denies trouble walking, talking, confusion, nausea, vomiting, dizziness, vision changes, difficulty concentrating, photophobia, phonophobia, behavior changes - having increase in headaches, has h/o migraines. Headaches not changed in character.   Observations/Objective:  Well appearing, in NAD. Bruising to R orbit, slight swelling. Alert and oriented.   Assessment and Plan:  Fall Grossly neurologically intact, unable to perform full neuro exam due to virtual setting. Local head pain located at site of swelling and bruise and without change in chronic headaches or neurological deficits, not on anticoagulation, doubt intracranial injury or bleed. No current need for head imaging. Does have worsened back pain with surgical hardware, will obtain imaging to ensure maintains proper placement. Strict return precautions given if develops new neurologic symptoms, worsened pain, or no improvement in pain and swelling.       I discussed the assessment and treatment plan with the patient. The patient was provided an opportunity to ask questions and all were answered. The patient agreed with the plan and demonstrated an understanding of the instructions.   The patient was advised to call back or seek an in-person evaluation if the symptoms worsen or if the condition fails to  improve as anticipated.  I provided 10 minutes of non-face-to-face time during this encounter.   Caro Laroche, DO

## 2021-04-14 ENCOUNTER — Telehealth: Payer: Self-pay

## 2021-04-14 NOTE — Progress Notes (Signed)
Chronic Care Management Pharmacy Assistant   Name: Lori Potts  MRN: [id number] DOB: April 30, 1946  Reason for Encounter:Diabetes Disease State Call.  Recent office visits:  03/31/2021 Ellwood Dense DO (PCP Office) No medication Changes noted 03/25/2021 Gabriel Cirri NP (PCP Office) start Ciprofloxacin 250 mg BID for 5 days  Recent consult visits:  No recent Consult Visit  Hospital visits:  None in previous 6 months  Medications: Outpatient Encounter Medications as of 04/14/2021  Medication Sig Note   albuterol (VENTOLIN HFA) 108 (90 Base) MCG/ACT inhaler Inhale 1 puff into the lungs every 6 (six) hours as needed for wheezing or shortness of breath.    ascorbic acid (VITAMIN C) 500 MG tablet Take 500 mg by mouth daily.    aspirin 81 MG EC tablet Take 1 tablet by mouth daily.    azelastine (ASTELIN) 0.1 % nasal spray PLACE 1 SPRAY INTO BOTH NOSTRILS 2 (TWO) TIMES DAILY. USE IN EACH NOSTRIL AS DIRECTED    Blood Glucose Monitoring Suppl (TRUE METRIX AIR GLUCOSE METER) DEVI True Metrix Glucose Meter (Patient not taking: No sig reported)    chlorpheniramine-HYDROcodone (TUSSIONEX PENNKINETIC ER) 10-8 MG/5ML SUER Take 5 mLs by mouth every 12 (twelve) hours as needed. (Patient not taking: No sig reported)    citalopram (CELEXA) 20 MG tablet TAKE 1 TABLET DAILY FOR NERVES, STRESS, ANXIETY SYMPTOMS (DOSE CHANGE)    Dulaglutide (TRULICITY) 0.75 MG/0.5ML SOPN Inject 0.75 mg into the skin once a week. 09/03/2020: Tuesdays    esomeprazole (NEXIUM) 40 MG capsule Take 1 capsule (40 mg total) by mouth daily. Follow up in office for more refills    fluticasone (FLONASE) 50 MCG/ACT nasal spray PLACE 2 SPRAYS INTO BOTH NOSTRILS EVERY EVENING.    fluticasone furoate-vilanterol (BREO ELLIPTA) 100-25 MCG/INH AEPB Inhale 1 puff into the lungs daily.    ipratropium-albuterol (DUONEB) 0.5-2.5 (3) MG/3ML SOLN CAN TAKE EVERY 20 MINUTES FOR 2 DOSES AS NEEDED FOR WHEEZING (Patient taking differently:  Inhale 3 mLs into the lungs every 6 (six) hours as needed (SOB).)    Lancets 33G MISC  (Patient not taking: No sig reported)    levocetirizine (XYZAL) 5 MG tablet Take 1 tablet (5 mg total) by mouth every evening.    losartan (COZAAR) 25 MG tablet TAKE 1 TABLET EVERY DAY (Patient taking differently: Take 25 mg by mouth daily.)    meloxicam (MOBIC) 7.5 MG tablet Mobic 7.5 mg tablet  Take 1 tablet twice a day by oral route.    methocarbamol (ROBAXIN) 500 MG tablet Take 500 mg by mouth every 8 (eight) hours as needed for muscle spasms.    montelukast (SINGULAIR) 10 MG tablet TAKE 1 TABLET BY MOUTH EVERYDAY AT BEDTIME    Multiple Vitamins-Minerals (MULTIVITAMIN WITH MINERALS) tablet Take 1 tablet by mouth daily.    oxyCODONE-acetaminophen (PERCOCET) 10-325 MG tablet Take 1 tablet by mouth 5 (five) times daily.  11/25/2020: prn   propranolol (INDERAL) 10 MG tablet TAKE 1 TABLET BY MOUTH THREE TIMES A DAY    rizatriptan (MAXALT-MLT) 5 MG disintegrating tablet Take at first sign of headache. May repeat in 2 hours if needed    rosuvastatin (CRESTOR) 40 MG tablet Take 1 tablet (40 mg total) by mouth at bedtime.    sodium chloride (OCEAN) 0.65 % SOLN nasal spray Place 1 spray into both nostrils 2 (two) times daily as needed for congestion.    No facility-administered encounter medications on file as of 04/14/2021.    Care Gaps: Influenza Vaccine  Star Rating Drugs: Rosuvastatin 40 mg last filled 02/10/2021 [facility] Losartan 25 mg last filled 09/15/2020 for [facility] day [facility] Trulicity 0.75 mg last filled 11/30/2020 for [facility] day [facility]  Recent Relevant Labs: Lab Results  Component Value Date/Time   HGBA1C 7.2 (A) 11/25/2020 08:04 AM   HGBA1C 7.2 (H) 11/14/2019 12:06 PM   HGBA1C 6.6 03/31/2019 12:00 AM   HGBA1C 6.3 (H) 05/09/2018 09:50 AM   MICROALBUR 5.2 05/14/2019 12:00 AM   MICROALBUR 3.8 05/09/2018 09:50 AM   MICROALBUR 20 07/04/2016 10:36 AM     Kidney Function Lab Results  Component Value Date/Time   CREATININE 0.64 11/25/2020 08:39 AM   CREATININE 0.64 09/14/2020 09:37 AM   CREATININE 0.66 02/18/2020 09:37 AM   GFRNONAA >60 09/14/2020 09:37 AM   GFRNONAA 87 02/18/2020 09:37 AM   GFRAA 101 02/18/2020 09:37 AM    Current antihyperglycemic regimen:  Trulicity 0.75 mg weekly Fridays  What recent interventions/DTPs have been made to improve glycemic control:  None ID Have there been any recent hospitalizations or ED visits since last visit with CPP? No  I have attempted without success to contact this patient by phone three times to do her Diabetes Disease State call. I left a Voice message for patient to return my call.LVM 08/24, Patient states to call back on 08/29, LVM 08/30   Adherence Review: Is the patient currently on a STATIN medication? Yes Is the patient currently on ACE/ARB medication? Yes Does the patient have >5 day gap between last estimated fill dates? Yes    [name] Clinical Pharmacist Assistant [phone]

## 2021-05-04 ENCOUNTER — Telehealth: Payer: Self-pay

## 2021-05-04 NOTE — Progress Notes (Signed)
Chronic Care Management Pharmacy Assistant   Name: Lori Potts  MRN: [id number] DOB: 1946-03-19  Reason for Encounter: Disease State call   Recent office visits:  No recent Office Visit  Recent consult visits:  No recent Consult Visit  Hospital visits:  None in previous 6 months  Medications: Outpatient Encounter Medications as of 05/04/2021  Medication Sig Note   albuterol (VENTOLIN HFA) 108 (90 Base) MCG/ACT inhaler Inhale 1 puff into the lungs every 6 (six) hours as needed for wheezing or shortness of breath.    ascorbic acid (VITAMIN C) 500 MG tablet Take 500 mg by mouth daily.    aspirin 81 MG EC tablet Take 1 tablet by mouth daily.    azelastine (ASTELIN) 0.1 % nasal spray PLACE 1 SPRAY INTO BOTH NOSTRILS 2 (TWO) TIMES DAILY. USE IN EACH NOSTRIL AS DIRECTED    Blood Glucose Monitoring Suppl (TRUE METRIX AIR GLUCOSE METER) DEVI True Metrix Glucose Meter (Patient not taking: No sig reported)    chlorpheniramine-HYDROcodone (TUSSIONEX PENNKINETIC ER) 10-8 MG/5ML SUER Take 5 mLs by mouth every 12 (twelve) hours as needed. (Patient not taking: No sig reported)    citalopram (CELEXA) 20 MG tablet TAKE 1 TABLET DAILY FOR NERVES, STRESS, ANXIETY SYMPTOMS (DOSE CHANGE)    Dulaglutide (TRULICITY) 0.75 MG/0.5ML SOPN Inject 0.75 mg into the skin once a week. 09/03/2020: Tuesdays    esomeprazole (NEXIUM) 40 MG capsule Take 1 capsule (40 mg total) by mouth daily. Follow up in office for more refills    fluticasone (FLONASE) 50 MCG/ACT nasal spray PLACE 2 SPRAYS INTO BOTH NOSTRILS EVERY EVENING.    fluticasone furoate-vilanterol (BREO ELLIPTA) 100-25 MCG/INH AEPB Inhale 1 puff into the lungs daily.    ipratropium-albuterol (DUONEB) 0.5-2.5 (3) MG/3ML SOLN CAN TAKE EVERY 20 MINUTES FOR 2 DOSES AS NEEDED FOR WHEEZING (Patient taking differently: Inhale 3 mLs into the lungs every 6 (six) hours as needed (SOB).)    Lancets 33G MISC  (Patient not taking: No sig reported)     levocetirizine (XYZAL) 5 MG tablet Take 1 tablet (5 mg total) by mouth every evening.    losartan (COZAAR) 25 MG tablet TAKE 1 TABLET EVERY DAY (Patient taking differently: Take 25 mg by mouth daily.)    meloxicam (MOBIC) 7.5 MG tablet Mobic 7.5 mg tablet  Take 1 tablet twice a day by oral route.    methocarbamol (ROBAXIN) 500 MG tablet Take 500 mg by mouth every 8 (eight) hours as needed for muscle spasms.    montelukast (SINGULAIR) 10 MG tablet TAKE 1 TABLET BY MOUTH EVERYDAY AT BEDTIME    Multiple Vitamins-Minerals (MULTIVITAMIN WITH MINERALS) tablet Take 1 tablet by mouth daily.    oxyCODONE-acetaminophen (PERCOCET) 10-325 MG tablet Take 1 tablet by mouth 5 (five) times daily.  11/25/2020: prn   propranolol (INDERAL) 10 MG tablet TAKE 1 TABLET BY MOUTH THREE TIMES A DAY    rizatriptan (MAXALT-MLT) 5 MG disintegrating tablet Take at first sign of headache. May repeat in 2 hours if needed    rosuvastatin (CRESTOR) 40 MG tablet Take 1 tablet (40 mg total) by mouth at bedtime.    sodium chloride (OCEAN) 0.65 % SOLN nasal spray Place 1 spray into both nostrils 2 (two) times daily as needed for congestion.    No facility-administered encounter medications on file as of 05/04/2021.     Care Gaps: Influenza Vaccine Covid-19 Vaccine Mammogram Star Rating Drugs: Rosuvastatin 40 mg last filled [facility]10/20[facility]  Losartan 25 mg last filled 09/15/2020 [facility] Trulicity 0.75 mg last filled 11/30/2020 [facility] Medication Fill Gaps: Citalopram 20 MG last filled 11/22/2020 90 day supply  Recent Relevant Labs: Lab Results  Component Value Date/Time   HGBA1C 7.2 (A) 11/25/2020 08:04 AM   HGBA1C 7.2 (H) 11/14/2019 12:06 PM   HGBA1C 6.6 03/31/2019 12:00 AM   HGBA1C 6.3 (H) 05/09/2018 09:50 AM   MICROALBUR 5.2 05/14/2019 12:00 AM   MICROALBUR 3.8 05/09/2018 09:50 AM   MICROALBUR 20 07/04/2016 10:36 AM    Kidney  Function Lab Results  Component Value Date/Time   CREATININE 0.64 11/25/2020 08:39 AM   CREATININE 0.64 09/14/2020 09:37 AM   CREATININE 0.66 02/18/2020 09:37 AM   GFRNONAA >60 09/14/2020 09:37 AM   GFRNONAA 87 02/18/2020 09:37 AM   GFRAA 101 02/18/2020 09:37 AM    Current antihyperglycemic regimen:  Trulicity 0.75 mg weekly Fridays   Patient denies any side effects from Trulicity.Patient reports taking Trulicity as prescribe without missing a dose.  What recent interventions/DTPs have been made to improve glycemic control:  None ID Have there been any recent hospitalizations or ED visits since last visit with CPP? No Patient reports hypoglycemic symptoms, including Sweaty Patient states she is sweaty all the time, but thinks it is from hot flashes.  Patient denies hyperglycemic symptoms, including blurry vision, excessive thirst, fatigue, polyuria, and weakness How often are you checking your blood sugar? once daily What are your blood sugars ranging?  Before meals:  Patient states her blood sugars ranges from 125-135  During the week, how often does your blood glucose drop below 70? Never Are you checking your feet daily/regularly?   Patient denies pain,numbness or tingling sensations in the bottom of her feet.  Adherence Review: Is the patient currently on a STATIN medication? Yes Is the patient currently on ACE/ARB medication? Yes Does the patient have >5 day gap between last estimated fill dates? Yes  [name] Clinical Pharmacist Assistant [phone]

## 2021-05-10 ENCOUNTER — Telehealth (INDEPENDENT_AMBULATORY_CARE_PROVIDER_SITE_OTHER): Payer: Medicare HMO | Admitting: Nurse Practitioner

## 2021-05-10 DIAGNOSIS — R059 Cough, unspecified: Secondary | ICD-10-CM

## 2021-05-10 DIAGNOSIS — J4521 Mild intermittent asthma with (acute) exacerbation: Secondary | ICD-10-CM | POA: Diagnosis not present

## 2021-05-10 DIAGNOSIS — J0100 Acute maxillary sinusitis, unspecified: Secondary | ICD-10-CM | POA: Diagnosis not present

## 2021-05-10 MED ORDER — GUAIFENESIN 100 MG/5ML PO SOLN
10.0000 mL | ORAL | 0 refills | Status: DC | PRN
Start: 2021-05-10 — End: 2021-07-08

## 2021-05-10 MED ORDER — AMOXICILLIN-POT CLAVULANATE 875-125 MG PO TABS: 1.0000 | ORAL_TABLET | Freq: Two times a day (BID) | ORAL | 0 refills | Status: AC

## 2021-05-10 NOTE — Progress Notes (Addendum)
Name: Lori Potts   MRN: [id number]    DOB: 03-17-1946   Date:05/10/2021       Progress Note  Subjective  Chief Complaint  Chief Complaint  Patient presents with   Cough    X3 days   Wheezing    I connected with  [name]  on 05/10/21 at 11:40 AM EDT by a video enabled telemedicine application and verified that I am speaking with the correct person using two identifiers.  I discussed the limitations of evaluation and management by telemedicine and the availability of in person appointments. The patient expressed understanding and agreed to proceed with a virtual visit  Staff also discussed with the patient that there may be a patient responsible charge related to this service. Patient Location: home Provider Location: [facility] Additional Individuals present: alone  HPI  Cough: She says she has been coughing for three days.  [facility]. She is currently taking tylenol and allergy medication.  She has also been doing her breathing treatments which help.  She denies any shortness of breath.  She says that tessalon perls do not work for her.  Will start her on guaifenesin.    Sinus pressure/pain: She reports sinus pressure for a week.  She reports tenderness to face.  She says the pressure is causing a headache.  She had a low grade fever yesterday but treated with tylenol.  Will start her on Augmentin.  Reactive airway disease: She is currently using albuterol inhaler PRN, Breo Ellipta daily and duo neb nebulizer PRN.  She says that she is not short of breath but does have occasional wheezing and the treatments help with that.   Patient Active Problem List   Diagnosis Date Noted   Neutropenia, unspecified type (HCC) 11/25/2020   Intractable migraine with aura without status migrainosus 04/18/2020   Body mass index (BMI) 31.0-31.9, adult 02/06/2020   Chronic left hip pain 12/26/2019   Anemia 09/22/2017   S/P knee replacement 07/28/2017   Allergic  rhinitis 05/26/2017   Vitamin D deficiency 05/19/2017   Chronic nonintractable headache 05/18/2017   Advanced care planning/counseling discussion    Gastroesophageal reflux disease    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HCC) 03/02/2017   Insomnia 11/29/2016   Chronic right-sided low back pain with right-sided sciatica 04/19/2016   Abdominal wall pain in right flank 03/29/2016   Diverticulosis 02/24/2016   Hypercholesterolemia 06/16/2015   Chronic pain 04/13/2015   Neck pain 02/26/2015   Status post lumbar surgery 02/26/2015   Abnormal ECG 01/27/2015   Angina pectoris (HCC) 01/27/2015   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) 01/27/2015   Essential (primary) hypertension 01/27/2015    Social History   Tobacco Use   Smoking status: Former    Types: Cigarettes    Quit date: 2006    Years since quitting: 16.7   Smokeless tobacco: Never   Tobacco comments:    quit smoking 87yrs ago  Substance Use Topics   Alcohol use: No     Current Outpatient Medications:    albuterol (VENTOLIN HFA) 108 (90 Base) MCG/ACT inhaler, Inhale 1 puff into the lungs every 6 (six) hours as needed for wheezing or shortness of breath., Disp: 18 g, Rfl: 2   amoxicillin-clavulanate (AUGMENTIN) 875-125 MG tablet, Take 1 tablet by mouth 2 (two) times daily for 10 days., Disp: 20 tablet, Rfl: 0   ascorbic acid (VITAMIN C) 500 MG tablet, Take 500 mg by mouth daily., Disp: ,  Rfl:    aspirin 81 MG EC tablet, Take 1 tablet by mouth daily., Disp: , Rfl:    azelastine (ASTELIN) 0.1 % nasal spray, PLACE 1 SPRAY INTO BOTH NOSTRILS 2 (TWO) TIMES DAILY. USE IN EACH NOSTRIL AS DIRECTED, Disp: 90 mL, Rfl: 1   Blood Glucose Monitoring Suppl (TRUE METRIX AIR GLUCOSE METER) DEVI, True Metrix Glucose Meter, Disp: , Rfl:    citalopram (CELEXA) 20 MG tablet, TAKE 1 TABLET DAILY FOR NERVES, STRESS, ANXIETY SYMPTOMS (DOSE CHANGE), Disp: 90 tablet, Rfl: 0   Dulaglutide (TRULICITY) 0.75 MG/0.5ML SOPN, Inject  0.75 mg into the skin once a week., Disp: 6 mL, Rfl: 1   esomeprazole (NEXIUM) 40 MG capsule, Take 1 capsule (40 mg total) by mouth daily. Follow up in office for more refills, Disp: 30 capsule, Rfl: 3   fluticasone (FLONASE) 50 MCG/ACT nasal spray, PLACE 2 SPRAYS INTO BOTH NOSTRILS EVERY EVENING., Disp: 48 mL, Rfl: 1   fluticasone furoate-vilanterol (BREO ELLIPTA) 100-25 MCG/INH AEPB, Inhale 1 puff into the lungs daily., Disp: , Rfl:    guaiFENesin (ROBITUSSIN) 100 MG/5ML SOLN, Take 10 mLs (200 mg total) by mouth every 4 (four) hours as needed for cough or to loosen phlegm., Disp: 236 mL, Rfl: 0   ipratropium-albuterol (DUONEB) 0.5-2.5 (3) MG/3ML SOLN, CAN TAKE EVERY 20 MINUTES FOR 2 DOSES AS NEEDED FOR WHEEZING (Patient taking differently: Inhale 3 mLs into the lungs every 6 (six) hours as needed (SOB).), Disp: 90 mL, Rfl: 0   Lancets 33G MISC, , Disp: , Rfl:    levocetirizine (XYZAL) 5 MG tablet, Take 1 tablet (5 mg total) by mouth every evening., Disp: 30 tablet, Rfl: 2   losartan (COZAAR) 25 MG tablet, TAKE 1 TABLET EVERY DAY (Patient taking differently: Take 25 mg by mouth daily.), Disp: 90 tablet, Rfl: 3   meloxicam (MOBIC) 7.5 MG tablet, Mobic 7.5 mg tablet  Take 1 tablet twice a day by oral route., Disp: , Rfl:    methocarbamol (ROBAXIN) 500 MG tablet, Take 500 mg by mouth every 8 (eight) hours as needed for muscle spasms., Disp: , Rfl:    montelukast (SINGULAIR) 10 MG tablet, TAKE 1 TABLET BY MOUTH EVERYDAY AT BEDTIME, Disp: 90 tablet, Rfl: 0   Multiple Vitamins-Minerals (MULTIVITAMIN WITH MINERALS) tablet, Take 1 tablet by mouth daily., Disp: , Rfl:    oxyCODONE-acetaminophen (PERCOCET) 10-325 MG tablet, Take 1 tablet by mouth 5 (five) times daily. , Disp: , Rfl: 0   propranolol (INDERAL) 10 MG tablet, TAKE 1 TABLET BY MOUTH THREE TIMES A DAY, Disp: 270 tablet, Rfl: 3   rizatriptan (MAXALT-MLT) 5 MG disintegrating tablet, Take at first sign of headache. May repeat in 2 hours if needed,  Disp: 10 tablet, Rfl: 0   rosuvastatin (CRESTOR) 40 MG tablet, Take 1 tablet (40 mg total) by mouth at bedtime., Disp: 90 tablet, Rfl: 0   sodium chloride (OCEAN) 0.65 % SOLN nasal spray, Place 1 spray into both nostrils 2 (two) times daily as needed for congestion., Disp: 60 mL, Rfl: 5  Allergies  Allergen Reactions   Metformin And Related Diarrhea   Adhesive [Tape] Rash and Other (See Comments)    Regular tape is ok, allergy is to paper tape    I personally reviewed active problem list, medication list, allergies with the patient/caregiver today.  ROS  Constitutional: positive for fever, negative weight change.  HEENT: positive for frontal, maxillary tenderness, sinus congestion Respiratory: positive for cough and wheezing, negative for shortness of breath.  Cardiovascular: Negative for chest pain or palpitations.  Gastrointestinal: Negative for abdominal pain, no bowel changes.  Musculoskeletal: Negative for gait problem or joint swelling.  Skin: Negative for rash.  Neurological: Negative for dizziness or headache.  No other specific complaints in a complete review of systems (except as listed in HPI above).   Objective  Virtual encounter, vitals not obtained.  There is no height or weight on file to calculate BMI.  Nursing Note and Vital Signs reviewed.  Physical Exam  Awake, alert and oriented, speaking in complete sentences  No results found for this or any previous visit (from the past 72 hour(s)).  Assessment & Plan  1. Cough  - guaiFENesin (ROBITUSSIN) 100 MG/5ML SOLN; Take 10 mLs (200 mg total) by mouth every 4 (four) hours as needed for cough or to loosen phlegm.  Dispense: 236 mL; Refill: 0  2. Acute non-recurrent maxillary sinusitis   amoxicillin-clavulanate (AUGMENTIN) 875-125 MG tablet; Take 1 tablet by mouth 2 (two) times daily for 10 days.  Dispense: 20 tablet; Refill: 0   3. Mild intermittent reactive airway disease with acute exacerbation   -Red  flags and when to present for emergency care or RTC including fever >101.56F, chest pain, shortness of breath, new/worsening/un-resolving symptoms, reviewed with patient at time of visit. Follow up and care instructions discussed and provided in AVS. - I discussed the assessment and treatment plan with the patient. The patient was provided an opportunity to ask questions and all were answered. The patient agreed with the plan and demonstrated an understanding of the instructions.  I provided 25 minutes of non-face-to-face time during this encounter.  [name], [name]

## 2021-05-14 ENCOUNTER — Telehealth: Payer: Self-pay

## 2021-05-14 NOTE — Telephone Encounter (Signed)
error 

## 2021-05-18 ENCOUNTER — Other Ambulatory Visit: Payer: Self-pay | Admitting: Family Medicine

## 2021-05-18 DIAGNOSIS — E7800 Pure hypercholesterolemia, unspecified: Secondary | ICD-10-CM

## 2021-05-18 NOTE — Telephone Encounter (Signed)
Pt needs to schedule in person visit for regular f/u has only been seen for sick visits.  1 month of chol med given

## 2021-05-18 NOTE — Telephone Encounter (Signed)
Appt scheduled for Oct 24 and she was informed of prescription being sent to pharamcy

## 2021-05-25 ENCOUNTER — Telehealth: Payer: Self-pay

## 2021-05-25 NOTE — Progress Notes (Signed)
APPOINTMENT REMINDER  Lori Potts was reminded to have all medications, supplements and any blood glucose and blood pressure readings available for review with Lori Potts, Lori Potts, at her telephone visit on 05/26/2021 at 8:15 am.  Patient confirm appointment .  Questions: Patient denies any side effects or issue with her current medications.Patient is asking when will she receive her shipment for Trulicity from [facility] because she is running low.  I reach out to [facility] at [phone] to ask about her Trulicity shipment.  Per [facility], patient has no more refills.Her refill can be [facility] [employer], [address] [place] [address].Once they receive her refill , it takes 24-48 hours to process it and she should receive her order next week.Notified Clinical Pharmacist.  I review patient application that was completed for Trulicity earlier this year, and made any changes if were needed.Patient application for Trulicity was sent to the clinical pharmacist to review and to send to patient to complete her part.   [name] Clinical Pharmacist Assistant [phone]

## 2021-05-26 ENCOUNTER — Other Ambulatory Visit: Payer: Self-pay | Admitting: Family Medicine

## 2021-05-26 ENCOUNTER — Ambulatory Visit (INDEPENDENT_AMBULATORY_CARE_PROVIDER_SITE_OTHER): Payer: Medicare HMO

## 2021-05-26 DIAGNOSIS — E7800 Pure hypercholesterolemia, unspecified: Secondary | ICD-10-CM

## 2021-05-26 DIAGNOSIS — K219 Gastro-esophageal reflux disease without esophagitis: Secondary | ICD-10-CM

## 2021-05-26 DIAGNOSIS — J309 Allergic rhinitis, unspecified: Secondary | ICD-10-CM

## 2021-05-26 DIAGNOSIS — E0829 Diabetes mellitus due to underlying condition with other diabetic kidney complication: Secondary | ICD-10-CM

## 2021-05-26 DIAGNOSIS — J4521 Mild intermittent asthma with (acute) exacerbation: Secondary | ICD-10-CM

## 2021-05-26 MED ORDER — TRULICITY 0.75 MG/0.5ML ~~LOC~~ SOAJ: 0.7500 mg | SUBCUTANEOUS | 1 refills | Status: DC

## 2021-05-26 NOTE — Progress Notes (Signed)
Chronic Care Management Pharmacy Note  05/26/2021 Name:  Lori Potts MRN:  [id number] DOB:  1946/03/03  Summary: Patient presents for CCM follow-up. Worsening pain in hip/back. Plans to schedule visit with orthopedic doctor. Blood sugars remain well controlled.     Recommendations/Changes made from today's visit: START CoQ10 100 mg daily Refill of Trulicity sent into [facility]   Plan: CPP follow-up in 6 months  Recommended Problem List Changes:  Modify:  Hypercholesterolemia to Hyperlipidemia associated with type 2 diabetes mellitus ([place])  Essential Hypertension to Hypertension associated with type 2 diabetes mellitus ([place])  Subjective: Lori Potts is an 75 y.o. year old female who is a primary patient of Bo Merino, [name].  The CCM team was consulted for assistance with disease management and care coordination needs.    Engaged with patient by telephone for follow up visit in response to provider referral for pharmacy case management and/or care coordination services.   Consent to Services:  The patient was given information about Chronic Care Management services, agreed to services, and gave verbal consent prior to initiation of services.  Please see initial visit note for detailed documentation.   Patient Care Team: Bo Merino, [name] as PCP - General (Nurse Practitioner) Germaine Pomfret, [facility] (Pharmacist) Thornton Park, MD as Referring Physician (Orthopedic Surgery) Levy Pupa, PA-C as Physician Assistant (Pain Medicine)  Recent office visits: 05/10/21: Patient presented to Serafina Royals, [name] for cough. Augmentin, guaifenesin stopped.  03/31/2021 Rory Percy DO (PCP Office) No medication Changes noted 03/25/2021 Kathrine Haddock NP (PCP Office) start Ciprofloxacin 250 mg BID for 5 days  Recent consult visits: None in past 6 months.  Hospital visits: None in previous 6 months   Objective:  Lab Results  Component Value Date   CREATININE  0.64 11/25/2020   BUN 15 11/25/2020   GFRNONAA >60 09/14/2020   GFRAA 101 02/18/2020   NA 141 11/25/2020   K 4.4 11/25/2020   CALCIUM 9.8 11/25/2020   CO2 30 11/25/2020   GLUCOSE 113 (H) 11/25/2020    Lab Results  Component Value Date/Time   HGBA1C 7.2 (A) 11/25/2020 08:04 AM   HGBA1C 7.2 (H) 11/14/2019 12:06 PM   HGBA1C 6.6 03/31/2019 12:00 AM   HGBA1C 6.3 (H) 05/09/2018 09:50 AM   MICROALBUR 5.2 05/14/2019 12:00 AM   MICROALBUR 3.8 05/09/2018 09:50 AM   MICROALBUR 20 07/04/2016 10:36 AM    Last diabetic Eye exam:  Lab Results  Component Value Date/Time   HMDIABEYEEXA No Retinopathy 06/02/2020 12:00 AM    Last diabetic Foot exam: No results found for: HMDIABFOOTEX   Lab Results  Component Value Date   CHOL 238 (H) 11/25/2020   HDL 61 11/25/2020   LDLCALC 154 (H) 11/25/2020   TRIG 112 11/25/2020   CHOLHDL 3.9 11/25/2020    Hepatic Function Latest Ref Rng & Units 11/25/2020 02/18/2020 11/14/2019  Total Protein 6.1 - 8.1 g/dL 7.2 6.6 6.7  Albumin 3.5 - 5.0 g/dL - - -  AST 10 - 35 U/L $Remo'19 17 17  'EKnrx$ ALT 6 - 29 U/L $Remo'17 16 18  'tAVQq$ Alk Phosphatase 38 - 126 U/L - - -  Total Bilirubin 0.2 - 1.2 mg/dL 0.4 0.5 0.5    Lab Results  Component Value Date/Time   TSH 2.00 02/18/2020 09:37 AM   TSH 0.71 11/14/2019 12:06 PM   FREET4 1.1 02/18/2020 09:37 AM    CBC Latest Ref Rng & Units 01/19/2021 09/14/2020 02/18/2020  WBC 3.8 - 10.8 Thousand/uL 5.9 5.3  4.2  Hemoglobin 11.7 - 15.5 g/dL 11.7 13.0 12.3  Hematocrit 35.0 - 45.0 % 36.3 39.3 38.1  Platelets 140 - 400 Thousand/uL 531(H) 414(H) 361    Lab Results  Component Value Date/Time   VD25OH 24 (L) 05/09/2018 09:50 AM   VD25OH 25 (L) 09/11/2017 02:09 PM    Clinical ASCVD: No  The 10-year ASCVD risk score (Arnett DK, et al., 2019) is: 42.2%   Values used to calculate the score:     Age: 75 years     Sex: Female     Is Non-Hispanic African American: Yes     Diabetic: Yes     Tobacco smoker: No     Systolic Blood Pressure: 740  mmHg     Is BP treated: Yes     HDL Cholesterol: 61 mg/dL     Total Cholesterol: 238 mg/dL    Depression screen [facility] 2/9 05/10/2021 03/31/2021 03/25/2021  Decreased Interest 3 0 0  Down, Depressed, Hopeless 3 0 0  PHQ - 2 Score 6 0 0  Altered sleeping 0 0 0  Tired, decreased energy 0 0 0  Change in appetite 0 0 0  Feeling bad or failure about yourself  0 0 0  Trouble concentrating 0 0 0  Moving slowly or fidgety/restless 0 0 0  Suicidal thoughts 0 0 0  PHQ-9 Score 6 0 0  Difficult doing work/chores - Not difficult at all Not difficult at all  Some recent data might be hidden     Social History   Tobacco Use  Smoking Status Former   Types: Cigarettes   Quit date: 2006   Years since quitting: 16.7  Smokeless Tobacco Never  Tobacco Comments   quit smoking 62yrs ago   BP Readings from Last 3 Encounters:  03/25/21 138/76  01/16/21 (!) 142/71  12/29/20 130/72   Pulse Readings from Last 3 Encounters:  03/25/21 78  01/16/21 (!) 106  12/29/20 72   Wt Readings from Last 3 Encounters:  03/25/21 180 lb 9.6 oz (81.9 kg)  01/16/21 185 lb (83.9 kg)  12/29/20 183 lb 9.6 oz (83.3 kg)   BMI Readings from Last 3 Encounters:  03/25/21 31.00 kg/m  01/16/21 32.26 kg/m  12/29/20 32.01 kg/m    Assessment/Interventions: Review of patient past medical history, allergies, medications, health status, including review of consultants reports, laboratory and other test data, was performed as part of comprehensive evaluation and provision of chronic care management services.   SDOH:  (Social Determinants of Health) assessments and interventions performed: Yes   SDOH Screenings   Alcohol Screen: Low Risk    Last Alcohol Screening Score (AUDIT): 0  Depression (PHQ2-9): Medium Risk   PHQ-2 Score: 6  Financial Resource Strain: Low Risk    Difficulty of Paying Living Expenses: Not hard at all  Food Insecurity: No Food Insecurity   Worried About Charity fundraiser in the Last Year: Never  true   Ran Out of Food in the Last Year: Never true  Housing: Low Risk    Last Housing Risk Score: 0  Physical Activity: Inactive   Days of Exercise per Week: 0 days   Minutes of Exercise per Session: 0 min  Social Connections: Engineer, building services of Communication with Friends and Family: More than three times a week   Frequency of Social Gatherings with Friends and Family: Twice a week   Attends Religious Services: More than 4 times per year   Active Member of [employer]  or Organizations: Yes   Attends Archivist Meetings: 1 to 4 times per year   Marital Status: Married  Stress: No Stress Concern Present   Feeling of Stress : Not at all  Tobacco Use: Medium Risk   Smoking Tobacco Use: Former   Smokeless Tobacco Use: Never  Transportation Needs: No Data processing manager (Medical): No   Lack of Transportation (Non-Medical): No    CCM Care Plan  Allergies  Allergen Reactions   Metformin And Related Diarrhea   Adhesive [Tape] Rash and Other (See Comments)    Regular tape is ok, allergy is to paper tape    Medications Reviewed Today     Reviewed by Carlene Coria, CMA (Certified Medical Assistant) on 05/10/21 at [place] List Status: <None>   Medication Order Taking? Sig Documenting Provider Last Dose Status Informant  albuterol (VENTOLIN HFA) 108 (90 Base) MCG/ACT inhaler [id number] Yes Inhale 1 puff into the lungs every 6 (six) hours as needed for wheezing or shortness of breath. Hubbard Hartshorn, [name] Taking Active Self  ascorbic acid (VITAMIN C) 500 MG tablet [id number] Yes Take 500 mg by mouth daily. [provider] Taking Active Self  aspirin 81 MG EC tablet [id number] Yes Take 1 tablet by mouth daily. [provider] Taking Active   azelastine (ASTELIN) 0.1 % nasal spray [id number] Yes PLACE 1 SPRAY INTO BOTH NOSTRILS 2 (TWO) TIMES DAILY. USE IN EACH NOSTRIL AS DIRECTED Steele Sizer, MD Taking Active   Blood Glucose  Monitoring Suppl (TRUE METRIX AIR GLUCOSE METER) DEVI [id number] Yes True Metrix Glucose Meter [provider] Taking Active   chlorpheniramine-HYDROcodone Amanda Cockayne PENNKINETIC ER) 10-8 MG/5ML SUER [id number] No Take 5 mLs by mouth every 12 (twelve) hours as needed.  Patient not taking: Reported on 05/10/2021   Steele Sizer, MD Not Taking Consider Medication Status and Discontinue   citalopram (CELEXA) 20 MG tablet [id number] Yes TAKE 1 TABLET DAILY FOR NERVES, STRESS, ANXIETY SYMPTOMS (DOSE CHANGE) Delsa Grana, PA-C Taking Active   Dulaglutide (TRULICITY) 7.12 WP/8.0DX SOPN [id number] Yes Inject 0.75 mg into the skin once a week. Delsa Grana, PA-C Taking Active Self           Med Note Wilmon Pali, MELISSA R   Thu Sep 03, 2020  2:21 PM) Tuesdays   esomeprazole (NEXIUM) 40 MG capsule [id number] Yes Take 1 capsule (40 mg total) by mouth daily. Follow up in office for more refills Delsa Grana, PA-C Taking Active   fluticasone (FLONASE) 50 MCG/ACT nasal spray [id number] Yes PLACE 2 SPRAYS INTO BOTH NOSTRILS EVERY EVENING. Steele Sizer, MD Taking Active   fluticasone furoate-vilanterol (BREO ELLIPTA) 100-25 MCG/INH AEPB [id number] Yes Inhale 1 puff into the lungs daily. [provider] Taking Active Self  ipratropium-albuterol (DUONEB) 0.5-2.5 (3) MG/3ML SOLN [id number] Yes CAN TAKE 3ML EVERY 20 MINUTES FOR 2 DOSES AS NEEDED FOR WHEEZING  Patient taking differently: Inhale 3 mLs into the lungs every 6 (six) hours as needed (SOB).   Delsa Grana, PA-C Taking Active   Lancets 33G MISC [id number] Yes  [provider] Taking Active   levocetirizine (XYZAL) 5 MG tablet [id number] Yes Take 1 tablet (5 mg total) by mouth every evening. Hubbard Hartshorn, [name] Taking Active   losartan (COZAAR) 25 MG tablet [id number] Yes TAKE 1 TABLET EVERY DAY  Patient taking differently: Take 25 mg by mouth daily.   Delsa Grana, PA-C Taking Active   meloxicam (MOBIC) 7.5 MG tablet [id number] Yes Mobic 7.5  mg tablet  Take 1 tablet twice a day by oral route. [provider] Taking Active   methocarbamol (ROBAXIN) 500 MG tablet [id number] Yes Take 500 mg by mouth every 8 (eight) hours as needed for muscle spasms. [provider] Taking Active   montelukast (SINGULAIR) 10 MG tablet [id number] Yes TAKE 1 TABLET BY MOUTH EVERYDAY AT BEDTIME Steele Sizer, MD Taking Active   Multiple Vitamins-Minerals (MULTIVITAMIN WITH MINERALS) tablet [id number] Yes Take 1 tablet by mouth daily. [provider] Taking Active Self  oxyCODONE-acetaminophen (PERCOCET) 10-325 MG tablet [id number] Yes Take 1 tablet by mouth 5 (five) times daily.  Levy Pupa, PA-C Taking Active Self           Med Note (JEFFRIES, HELEN G   Wed Nov 25, 2020  8:00 AM) prn  propranolol (INDERAL) 10 MG tablet [id number] Yes TAKE 1 TABLET BY MOUTH THREE TIMES A DAY Delsa Grana, PA-C Taking Active   rizatriptan (MAXALT-MLT) 5 MG disintegrating tablet [id number] Yes Take at first sign of headache. May repeat in 2 hours if needed Pollak, Adriana M, PA-C Taking Active   rosuvastatin (CRESTOR) 40 MG tablet [id number] Yes Take 1 tablet (40 mg total) by mouth at bedtime. Delsa Grana, PA-C Taking Active   sodium chloride (OCEAN) 0.65 % SOLN nasal spray [id number] Yes Place 1 spray into both nostrils 2 (two) times daily as needed for congestion. Fredderick Severance, NP Taking Active Self            Patient Active Problem List   Diagnosis Date Noted   Neutropenia, unspecified type ([place]) 11/25/2020   Intractable migraine with aura without status migrainosus 04/18/2020   Body mass index (BMI) 31.0-31.9, adult 02/06/2020   Chronic left hip pain 12/26/2019   Anemia 09/22/2017   S/P knee replacement 07/28/2017   Allergic rhinitis 05/26/2017   Vitamin D deficiency 05/19/2017   Chronic nonintractable headache 05/18/2017   Advanced care planning/counseling discussion    Gastroesophageal reflux disease    Calcification of aorta  ([place]) 03/02/2017   Insomnia 11/29/2016   Chronic right-sided low back pain with right-sided sciatica 04/19/2016   Abdominal wall pain in right flank 03/29/2016   Diverticulosis 02/24/2016   Hypercholesterolemia 06/16/2015   Chronic pain 04/13/2015   Neck pain 02/26/2015   Status post lumbar surgery 02/26/2015   Abnormal ECG 01/27/2015   Angina pectoris ([place]) 01/27/2015   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[place]) 01/27/2015   Essential (primary) hypertension 01/27/2015    Immunization History  Administered Date(s) Administered   Fluad Quad(high Dose 65+) 05/08/2019   Influenza, High Dose Seasonal PF 06/10/2015, 06/20/2016, 05/09/2017, 05/09/2018, 04/17/2020   PFIZER(Purple Top)SARS-COV-2 Vaccination 09/11/2019, 10/02/2019, 06/08/2020, 12/29/2020   Pneumococcal Conjugate-13 11/28/2013   Pneumococcal Polysaccharide-23 03/16/2018   Tdap 05/08/2019    Conditions to be addressed/monitored:  Hypertension, Hyperlipidemia, Diabetes, Coronary Artery Disease, GERD, and Allergic Rhinitis  There are no care plans that you recently modified to display for this patient.     Medication Assistance:  Trulicity obtained through [facility] medication assistance program.  Enrollment ends Dec 2022  Compliance/Adherence/Medication fill history: Care Gaps: Mammogram  Star-Rating Drugs: Rosuvastatin 80 mg last filled on 11/27/2020 for a 90-Day supply with [facility] Atorvastatin 80 mg last filled on 11/26/2020 for a 90-Day supply with [facility] Trulicity 2.02 mg last filled on 11/30/2020 for a 84-Day supply with [facility] Losartan 25 mg last filled on 09/15/2020 for a 90-Day supply with [facility]  Patient's preferred pharmacy is:  [id number] [address], [place] [address] Phone: [phone] Fax: [phone]  [place], [place] [address] Phone: [phone] Fax: [phone]  [facility] ([place], [place] [address] Phone: [phone] Fax: [phone]  [id number] [place], [place] [address] Phone: [phone] Fax: [phone]  Uses pill box? Yes Pt endorses 100% compliance  We discussed: Current pharmacy is preferred with insurance plan and patient is satisfied with pharmacy services Patient decided to: Continue current medication management strategy  Care Plan and Follow Up Patient Decision:  Patient agrees to Care Plan and Follow-up.  Plan: Telephone follow up appointment with care management team member scheduled for:  11/24/2020 at 8:30 AM  Malva Limes, [facility] [phone]

## 2021-05-26 NOTE — Patient Instructions (Signed)
Visit Information It was great speaking with you today!  Please let me know if you have any questions about our visit.   Goals Addressed   None     Patient Care Plan: General Pharmacy (Adult)     Problem Identified: Hypertension, Hyperlipidemia, Diabetes, Coronary Artery Disease, GERD, and Allergic Rhinitis   Priority: High     Long-Range Goal: Patient-Specific Goal   Start Date: 03/18/2021  Expected End Date: 03/18/2022  This Visit's Progress: On track  Priority: High  Note:   Current Barriers:  Unable to independently afford treatment regimen Unable to achieve control of cholesterol   Pharmacist Clinical Goal(s):  Patient will achieve control of cholesterol as evidenced by LDL less than 100 maintain control of diabetes as evidenced by A1c less than 8%  through collaboration with PharmD and provider.   Interventions: 1:1 collaboration with Danelle Berry, PA-C regarding development and update of comprehensive plan of care as evidenced by provider attestation and co-signature Inter-disciplinary care team collaboration (see longitudinal plan of care) Comprehensive medication review performed; medication list updated in electronic medical record  Hypertension (BP goal <140/90) -Controlled -Current treatment: Losartan 25 mg daily  Propanolol 10 mg twice daily  -Medications previously tried: NA  -Current home readings: NA -Denies hypotensive/hypertensive symptoms -Counseled to monitor BP at home weekly, document, and provide log at future appointments -Recommended to continue current medication  Hyperlipidemia: (LDL goal < 100) -Uncontrolled -Current treatment: Rosuvastatin 40 mg daily  -Medications previously tried: NA  -Soreness all over body, worst in hips, spine. Started 3 months ago, complicated by patient's chronic sciatic pain. Slightly better since switching from atorvastatin to rosuvastatin.  -Educated on Strategies to manage statin-induced myalgias; -START CoQ10  100 mg daily   Diabetes (A1c goal <8%) -Controlled -Current medications: Trulicity 0.75 mg weekly Fridays  -Medications previously tried: NA  -Current home glucose readings fasting glucose: 90-110 -Reports hypoglycemic symptoms: Felt nervous -Recommended to continue current medication  Patient Goals/Self-Care Activities Patient will:  - check glucose daily before breakfast, document, and provide at future appointments check blood pressure weekly, document, and provide at future appointments  Follow Up Plan: Telephone follow up appointment with care management team member scheduled for:  05/26/2021 at 8:15 AM    Patient agreed to services and verbal consent obtained.   The patient verbalized understanding of instructions, educational materials, and care plan provided today and declined offer to receive copy of patient instructions, educational materials, and care plan.   Cheyenne Adas, CPP Clinical Pharmacist [facility] [phone]

## 2021-06-07 ENCOUNTER — Other Ambulatory Visit: Payer: Self-pay

## 2021-06-07 ENCOUNTER — Ambulatory Visit (INDEPENDENT_AMBULATORY_CARE_PROVIDER_SITE_OTHER): Payer: Medicare HMO

## 2021-06-07 DIAGNOSIS — Z23 Encounter for immunization: Secondary | ICD-10-CM

## 2021-06-08 DIAGNOSIS — H524 Presbyopia: Secondary | ICD-10-CM | POA: Diagnosis not present

## 2021-06-08 DIAGNOSIS — Z0100 Encounter for examination of eyes and vision without abnormal findings: Secondary | ICD-10-CM | POA: Diagnosis not present

## 2021-06-14 ENCOUNTER — Ambulatory Visit: Payer: Medicare HMO | Admitting: Nurse Practitioner

## 2021-06-15 ENCOUNTER — Ambulatory Visit: Payer: Medicare HMO | Admitting: Nurse Practitioner

## 2021-06-21 DIAGNOSIS — E7800 Pure hypercholesterolemia, unspecified: Secondary | ICD-10-CM

## 2021-06-21 DIAGNOSIS — E0829 Diabetes mellitus due to underlying condition with other diabetic kidney complication: Secondary | ICD-10-CM

## 2021-06-21 DIAGNOSIS — R809 Proteinuria, unspecified: Secondary | ICD-10-CM | POA: Diagnosis not present

## 2021-06-29 ENCOUNTER — Other Ambulatory Visit: Payer: Self-pay | Admitting: Family Medicine

## 2021-06-29 DIAGNOSIS — K219 Gastro-esophageal reflux disease without esophagitis: Secondary | ICD-10-CM

## 2021-06-29 DIAGNOSIS — G894 Chronic pain syndrome: Secondary | ICD-10-CM | POA: Diagnosis not present

## 2021-06-29 DIAGNOSIS — J4521 Mild intermittent asthma with (acute) exacerbation: Secondary | ICD-10-CM

## 2021-06-29 DIAGNOSIS — E7800 Pure hypercholesterolemia, unspecified: Secondary | ICD-10-CM

## 2021-06-29 NOTE — Telephone Encounter (Signed)
Not appropriate

## 2021-06-30 DIAGNOSIS — M5416 Radiculopathy, lumbar region: Secondary | ICD-10-CM | POA: Diagnosis not present

## 2021-06-30 NOTE — Telephone Encounter (Signed)
Requested Prescriptions  Pending Prescriptions Disp Refills  . rosuvastatin (CRESTOR) 40 MG tablet [Pharmacy Med Name: ROSUVASTATIN CALCIUM 40 MG TAB] 90 tablet 1    Sig: TAKE 1 TABLET BY MOUTH EVERYDAY AT BEDTIME     Cardiovascular:  Antilipid - Statins Failed - 06/29/2021  2:46 PM      Failed - Total Cholesterol in normal range and within 360 days    Cholesterol  Date Value Ref Range Status  11/25/2020 238 (H) <200 mg/dL Final         Failed - LDL in normal range and within 360 days    LDL Cholesterol (Calc)  Date Value Ref Range Status  11/25/2020 154 (H) mg/dL (calc) Final    Comment:    Reference range: <100 . Desirable range <100 mg/dL for primary prevention;   <70 mg/dL for patients with CHD or diabetic patients  with > or = 2 CHD risk factors. Marland Kitchen LDL-C is now calculated using the Martin-Hopkins  calculation, which is a validated novel method providing  better accuracy than the Friedewald equation in the  estimation of LDL-C.  Horald Pollen et al. Lenox Ahr. 0160;109(32): 2061-2068  (http://education.[employer].com/faq/FAQ164)          Passed - HDL in normal range and within 360 days    HDL  Date Value Ref Range Status  11/25/2020 61 > OR = 50 mg/dL Final         Passed - Triglycerides in normal range and within 360 days    Triglycerides  Date Value Ref Range Status  11/25/2020 112 <150 mg/dL Final         Passed - Patient is not pregnant      Passed - Valid encounter within last 12 months    Recent Outpatient Visits          1 month ago Cough   [facility] [name], [name]   3 months ago Acute low back pain, unspecified back pain laterality, unspecified whether sciatica present   [facility] Caro Laroche, DO   3 months ago Acute cystitis without hematuria   [facility] Gabriel Cirri, NP   5 months ago Acute cystitis with hematuria   [facility] Gabriel Cirri, NP   6  months ago Urinary tract infection with hematuria, site unspecified   [facility] Danelle Berry, PA-C      Future Appointments            In 4 months [facility]            . esomeprazole (NEXIUM) 40 MG capsule [Pharmacy Med Name: ESOMEPRAZOLE MAG DR 40 MG CAP] 90 capsule 1    Sig: TAKE 1 CAPSULE (40 MG TOTAL) BY MOUTH DAILY. FOLLOW UP IN OFFICE FOR MORE REFILLS     Gastroenterology: Proton Pump Inhibitors Passed - 06/29/2021  2:46 PM      Passed - Valid encounter within last 12 months    Recent Outpatient Visits          1 month ago Cough   [facility] [name], [name]   3 months ago Acute low back pain, unspecified back pain laterality, unspecified whether sciatica present   [facility] Caro Laroche, DO   3 months ago Acute cystitis without hematuria   [facility] Gabriel Cirri, NP   5 months ago Acute cystitis with hematuria  [facility] [place], Elnita Maxwell, NP   6 months ago Urinary tract infection with hematuria, site unspecified   [facility] Danelle Berry, PA-C      Future Appointments            In 4 months [facility]

## 2021-07-01 ENCOUNTER — Other Ambulatory Visit: Payer: Self-pay | Admitting: Neurosurgery

## 2021-07-01 DIAGNOSIS — M5416 Radiculopathy, lumbar region: Secondary | ICD-10-CM

## 2021-07-08 ENCOUNTER — Encounter: Payer: Self-pay | Admitting: Nurse Practitioner

## 2021-07-08 ENCOUNTER — Ambulatory Visit (INDEPENDENT_AMBULATORY_CARE_PROVIDER_SITE_OTHER): Payer: Medicare HMO | Admitting: Nurse Practitioner

## 2021-07-08 ENCOUNTER — Other Ambulatory Visit: Payer: Self-pay

## 2021-07-08 VITALS — BP 128/76 | HR 82 | Temp 97.8°F | Resp 26 | Ht 64.0 in | Wt 183.2 lb

## 2021-07-08 DIAGNOSIS — F430 Acute stress reaction: Secondary | ICD-10-CM

## 2021-07-08 DIAGNOSIS — E1169 Type 2 diabetes mellitus with other specified complication: Secondary | ICD-10-CM | POA: Diagnosis not present

## 2021-07-08 DIAGNOSIS — Z5181 Encounter for therapeutic drug level monitoring: Secondary | ICD-10-CM | POA: Diagnosis not present

## 2021-07-08 DIAGNOSIS — K219 Gastro-esophageal reflux disease without esophagitis: Secondary | ICD-10-CM | POA: Diagnosis not present

## 2021-07-08 DIAGNOSIS — E785 Hyperlipidemia, unspecified: Secondary | ICD-10-CM

## 2021-07-08 DIAGNOSIS — R809 Proteinuria, unspecified: Secondary | ICD-10-CM | POA: Diagnosis not present

## 2021-07-08 DIAGNOSIS — E1129 Type 2 diabetes mellitus with other diabetic kidney complication: Secondary | ICD-10-CM | POA: Diagnosis not present

## 2021-07-08 DIAGNOSIS — I10 Essential (primary) hypertension: Secondary | ICD-10-CM

## 2021-07-08 DIAGNOSIS — J4540 Moderate persistent asthma, uncomplicated: Secondary | ICD-10-CM

## 2021-07-08 DIAGNOSIS — G43019 Migraine without aura, intractable, without status migrainosus: Secondary | ICD-10-CM

## 2021-07-08 DIAGNOSIS — F419 Anxiety disorder, unspecified: Secondary | ICD-10-CM | POA: Diagnosis not present

## 2021-07-08 MED ORDER — EMGALITY 120 MG/ML ~~LOC~~ SOAJ: 120.0000 mg | SUBCUTANEOUS | 3 refills | Status: DC

## 2021-07-08 MED ORDER — CITALOPRAM HYDROBROMIDE 20 MG PO TABS: 40.0000 mg | ORAL_TABLET | Freq: Every day | ORAL | 0 refills | Status: DC

## 2021-07-08 MED ORDER — ESOMEPRAZOLE MAGNESIUM 40 MG PO CPDR: 40.0000 mg | DELAYED_RELEASE_CAPSULE | Freq: Every day | ORAL | 1 refills | Status: DC

## 2021-07-08 MED ORDER — ALBUTEROL SULFATE HFA 108 (90 BASE) MCG/ACT IN AERS
1.0000 | INHALATION_SPRAY | Freq: Four times a day (QID) | RESPIRATORY_TRACT | 2 refills | Status: DC | PRN
Start: 2021-07-08 — End: 2021-11-11

## 2021-07-08 NOTE — Progress Notes (Signed)
BP 128/76   Pulse 82   Temp 97.8 F (36.6 C) (Oral)   Resp (!) 26   Ht 5\' 4"  (1.626 m)   Wt 183 lb 3.2 oz (83.1 kg)   SpO2 95%   BMI 31.45 kg/m    Subjective:    Patient ID: , female    DOB: 05-29-1946, 75 y.o.   MRN: [id number]  HPI: Lori Potts is a 75 y.o. female, with husband  Chief Complaint  Patient presents with   Diabetes   Hypertension   Hyperlipidemia   Diabetes:  She says that her fasting blood sugars have been around 127.  She denies any polyphagia, polydipsia or polyuria.  She is currently taking trulicity 0.75 mg weekly.  She denies any hypoglycemia episodes. We spent some time discussing the foods she should avoid.   HTN;  She says she doesn't really check her blood pressure at home.  She denies any chest pain, shortness of breath, lower extremity edema, dizziness, or blurred vision. She is currently on losartan 25 mg daily.  Her blood pressure in the office was 128/76.  Hyperlipidemia: She is currently taking rosuvastatin 40 mg daily. She denies any myalgia.  Her last LDL 154 on 11/25/20.  Will recheck labs today. The 10-year ASCVD risk score (Arnett DK, et al., 2019) is: 38.7%   Values used to calculate the score:     Age: 43 years     Sex: Female     Is Non-Hispanic African American: Yes     Diabetic: Yes     Tobacco smoker: No     Systolic Blood Pressure: 128 mmHg     Is BP treated: Yes     HDL Cholesterol: 61 mg/dL     Total Cholesterol: 238 mg/dL   Asthma:  She says she uses her albuterol inhaler every morning.  She says the Breo inhaler is to expensive for her.  She says she has been doing better lately, but that it was hard when she was sick.   Migraines:  She says that she has been having right sided migraines for years.  She has tried multiple preventatives and abortive therapies.  She has tried propranolol, imitrex, and celexa.  Most recently she tried Maxalt and she says it does not really help. Will try emgality.    GERD:  She says she has been doing well with her esomeprazole 40 mg daily.  She says that she can tell when she does not take her medication that it flares up.    Anxiety:  She says she can tell if she skips a dose, her anxiety goes wild.  She says she has been on the celexa 20 mg for a long time.  We will increase her dose to 40 mg and she will follow- up in a month to see how she is doing.  GAD 7 : Generalized Anxiety Score 07/08/2021 03/25/2021 11/25/2020 11/20/2020  Nervous, Anxious, on Edge 2 0 0 1  Control/stop worrying 2 0 0 1  Worry too much - different things 2 0 0 1  Trouble relaxing 2 0 0 1  Restless 1 0 0 0  Easily annoyed or irritable 1 0 0 0  Afraid - awful might happen 1 0 0 0  Total GAD 7 Score 11 0 0 4  Anxiety Difficulty - Not difficult at all Not difficult at all Somewhat difficult     Relevant past medical, surgical, family and social history reviewed and updated  as indicated. Interim medical history since our last visit reviewed. Allergies and medications reviewed and updated.  Review of Systems  Constitutional: Negative for fever or weight change.  Respiratory: Negative for cough and shortness of breath.   Cardiovascular: Negative for chest pain or palpitations.  Gastrointestinal: Negative for abdominal pain, no bowel changes.  Musculoskeletal: Negative for gait problem or joint swelling.  Skin: Negative for rash.  Neurological: Negative for dizziness, positive for headache.  No other specific complaints in a complete review of systems (except as listed in HPI above).      Objective:    BP 128/76   Pulse 82   Temp 97.8 F (36.6 C) (Oral)   Resp (!) 26   Ht 5\' 4"  (1.626 m)   Wt 183 lb 3.2 oz (83.1 kg)   SpO2 95%   BMI 31.45 kg/m   Wt Readings from Last 3 Encounters:  07/08/21 183 lb 3.2 oz (83.1 kg)  03/25/21 180 lb 9.6 oz (81.9 kg)  01/16/21 185 lb (83.9 kg)    Physical Exam  Constitutional: Patient appears well-developed and well-nourished. No distress.   HEENT: head atraumatic, normocephalic, pupils equal and reactive to light, neck supple Cardiovascular: Normal rate, regular rhythm and normal heart sounds.  No murmur heard. No BLE edema. Pulmonary/Chest: Effort normal and breath sounds normal. No respiratory distress. Abdominal: Soft.  There is no tenderness. Psychiatric: Patient has a normal mood and affect. behavior is normal. Judgment and thought content normal.   Results for orders placed or performed in visit on 03/25/21  Urine Culture   Specimen: Urine  Result Value Ref Range   MICRO NUMBER: 05/25/21    SPECIMEN QUALITY: Adequate    Sample Source URINE    STATUS: FINAL    ISOLATE 1: Escherichia coli (A)       Susceptibility   Escherichia coli - URINE CULTURE, REFLEX    AMOX/CLAVULANIC 4 Sensitive     AMPICILLIN 4 Sensitive     AMPICILLIN/SULBACTAM <=2 Sensitive     CEFAZOLIN* <=4 Not Reportable      * For infections other than uncomplicated UTI caused by E. coli, K. pneumoniae or P. mirabilis: Cefazolin is resistant if MIC > or = 8 mcg/mL. (Distinguishing susceptible versus intermediate for isolates with MIC < or = 4 mcg/mL requires additional testing.) For uncomplicated UTI caused by E. coli, K. pneumoniae or P. mirabilis: Cefazolin is susceptible if MIC <32 mcg/mL and predicts susceptible to the oral agents cefaclor, cefdinir, cefpodoxime, cefprozil, cefuroxime, cephalexin and loracarbef.     CEFTAZIDIME <=1 Sensitive     CEFEPIME <=1 Sensitive     CEFTRIAXONE <=1 Sensitive     CIPROFLOXACIN <=0.25 Sensitive     LEVOFLOXACIN <=0.12 Sensitive     GENTAMICIN <=1 Sensitive     IMIPENEM <=0.25 Sensitive     NITROFURANTOIN <=16 Sensitive     PIP/TAZO <=4 Sensitive     TOBRAMYCIN <=1 Sensitive     TRIMETH/SULFA* <=20 Sensitive      * For infections other than uncomplicated UTI caused by E. coli, K. pneumoniae or P. mirabilis: Cefazolin is resistant if MIC > or = 8 mcg/mL. (Distinguishing susceptible versus  intermediate for isolates with MIC < or = 4 mcg/mL requires additional testing.) For uncomplicated UTI caused by E. coli, K. pneumoniae or P. mirabilis: Cefazolin is susceptible if MIC <32 mcg/mL and predicts susceptible to the oral agents cefaclor, cefdinir, cefpodoxime, cefprozil, cefuroxime, cephalexin and loracarbef. Legend: S = Susceptible  I = Intermediate R =  Resistant  NS = Not susceptible * = Not tested  NR = Not reported **NN = See antimicrobic comments   POCT urinalysis dipstick  Result Value Ref Range   Color, UA gold    Clarity, UA cloudy    Glucose, UA Negative Negative   Bilirubin, UA negative    Ketones, UA negative    Spec Grav, UA 1.020 1.010 - 1.025   Blood, UA large    pH, UA 5.0 5.0 - 8.0   Protein, UA Positive (A) Negative   Urobilinogen, UA 0.2 0.2 or 1.0 E.U./dL   Nitrite, UA large    Leukocytes, UA Large (3+) (A) Negative   Appearance cloudy    Odor strong       Assessment & Plan:   1. Type 2 diabetes mellitus with microalbuminuria, without long-term current use of insulin (HCC)  - COMPLETE METABOLIC PANEL WITH GFR - Hemoglobin A1c  2. Essential (primary) hypertension  - CBC with Differential/Platelet - COMPLETE METABOLIC PANEL WITH GFR  3. Hyperlipidemia associated with type 2 diabetes mellitus (HCC)  - Lipid panel  4. Moderate persistent asthma without complication  - albuterol (VENTOLIN HFA) 108 (90 Base) MCG/ACT inhaler; Inhale 1 puff into the lungs every 6 (six) hours as needed for wheezing or shortness of breath.  Dispense: 18 g; Refill: 2  5. Intractable migraine without aura and without status migrainosus  - Galcanezumab-gnlm (EMGALITY) 120 MG/ML SOAJ; Inject 120 mg into the skin every 30 (thirty) days.  Dispense: 1.12 mL; Refill: 3  6. Gastroesophageal reflux disease, unspecified whether esophagitis present  - esomeprazole (NEXIUM) 40 MG capsule; Take 1 capsule (40 mg total) by mouth daily. Follow up in office for more  refills  Dispense: 90 capsule; Refill: 1  7. Acute stress reaction  - citalopram (CELEXA) 20 MG tablet; Take 2 tablets (40 mg total) by mouth daily.  Dispense: 90 tablet; Refill: 0  8. Anxiety  - citalopram (CELEXA) 20 MG tablet; Take 2 tablets (40 mg total) by mouth daily.  Dispense: 90 tablet; Refill: 0  9. Medication monitoring encounter  - Lipid panel - CBC with Differential/Platelet - COMPLETE METABOLIC PANEL WITH GFR - Hemoglobin A1c   Follow up plan: Return in about 4 weeks (around 08/05/2021) for follow up.

## 2021-07-09 LAB — COMPLETE METABOLIC PANEL WITH GFR
AG Ratio: 1.4 (calc) (ref 1.0–2.5)
ALT: 14 U/L (ref 6–29)
AST: 23 U/L (ref 10–35)
Albumin: 4.3 g/dL (ref 3.6–5.1)
Alkaline phosphatase (APISO): 83 U/L (ref 37–153)
BUN: 18 mg/dL (ref 7–25)
CO2: 28 mmol/L (ref 20–32)
Calcium: 10.1 mg/dL (ref 8.6–10.4)
Chloride: 106 mmol/L (ref 98–110)
Creat: 0.81 mg/dL (ref 0.60–1.00)
Globulin: 3 g/dL (calc) (ref 1.9–3.7)
Glucose, Bld: 103 mg/dL — ABNORMAL HIGH (ref 65–99)
Potassium: 4.1 mmol/L (ref 3.5–5.3)
Sodium: 143 mmol/L (ref 135–146)
Total Bilirubin: 0.4 mg/dL (ref 0.2–1.2)
Total Protein: 7.3 g/dL (ref 6.1–8.1)
eGFR: 76 mL/min/{1.73_m2} (ref >=60)

## 2021-07-09 LAB — LIPID PANEL
Cholesterol: 163 mg/dL (ref <200)
HDL: 53 mg/dL (ref >=50)
LDL Cholesterol (Calc): 90 mg/dL (calc)
Non-HDL Cholesterol (Calc): 110 mg/dL (calc) (ref <130)
Total CHOL/HDL Ratio: 3.1 (calc) (ref <5.0)
Triglycerides: 102 mg/dL (ref <150)

## 2021-07-09 LAB — CBC WITH DIFFERENTIAL/PLATELET
Absolute Monocytes: 353 cells/uL (ref 200–950)
Basophils Absolute: 49 cells/uL (ref 0–200)
Basophils Relative: 1 %
Eosinophils Absolute: 221 cells/uL (ref 15–500)
Eosinophils Relative: 4.5 %
HCT: 38.5 % (ref 35.0–45.0)
Hemoglobin: 12.5 g/dL (ref 11.7–15.5)
Lymphs Abs: 1215 cells/uL (ref 850–3900)
MCH: 26.2 pg — ABNORMAL LOW (ref 27.0–33.0)
MCHC: 32.5 g/dL (ref 32.0–36.0)
MCV: 80.5 fL (ref 80.0–100.0)
MPV: 10.9 fL (ref 7.5–12.5)
Monocytes Relative: 7.2 %
Neutro Abs: 3063 cells/uL (ref 1500–7800)
Neutrophils Relative %: 62.5 %
Platelets: 441 10*3/uL — ABNORMAL HIGH (ref 140–400)
RBC: 4.78 10*6/uL (ref 3.80–5.10)
RDW: 14.2 % (ref 11.0–15.0)
Total Lymphocyte: 24.8 %
WBC: 4.9 10*3/uL (ref 3.8–10.8)

## 2021-07-09 LAB — HEMOGLOBIN A1C
Hgb A1c MFr Bld: 6.9 % of total Hgb — ABNORMAL HIGH (ref <5.7)
Mean Plasma Glucose: 151 mg/dL
eAG (mmol/L): 8.4 mmol/L

## 2021-07-12 ENCOUNTER — Other Ambulatory Visit: Payer: Self-pay

## 2021-07-12 ENCOUNTER — Telehealth (INDEPENDENT_AMBULATORY_CARE_PROVIDER_SITE_OTHER): Payer: Medicare HMO | Admitting: Nurse Practitioner

## 2021-07-12 ENCOUNTER — Encounter: Payer: Self-pay | Admitting: Nurse Practitioner

## 2021-07-12 DIAGNOSIS — J4540 Moderate persistent asthma, uncomplicated: Secondary | ICD-10-CM | POA: Diagnosis not present

## 2021-07-12 DIAGNOSIS — J069 Acute upper respiratory infection, unspecified: Secondary | ICD-10-CM

## 2021-07-12 MED ORDER — GUAIFENESIN-DM 100-10 MG/5ML PO SYRP
10.0000 mL | ORAL_SOLUTION | ORAL | 0 refills | Status: DC | PRN
Start: 2021-07-12 — End: 2021-08-24

## 2021-07-12 NOTE — Progress Notes (Signed)
Name: Lori Potts   MRN: [id number]    DOB: [birth year]   Date:07/12/2021       Progress Note  Subjective  Chief Complaint  Chief Complaint  Patient presents with   URI    Cough, congested    I connected with  Lori Potts  on 07/12/21 at  1:00 PM EST by a video enabled telemedicine application and verified that I am speaking with the correct person using two identifiers.  I discussed the limitations of evaluation and management by telemedicine and the availability of in person appointments. The patient expressed understanding and agreed to proceed with a virtual visit  Staff also discussed with the patient that there may be a patient responsible charge related to this service. Patient Location: home Provider Location: [facility] Additional Individuals present: alone  HPI  URI: She says she woke up yesterday with nasal congestion and cough.  She denies any shortness of breath or fever.  She said she did a home Covid test and it was negative.  She declined covid pcr and flu testing.  She is requesting cough medication.  She says tessalon perls do not help her.  Discussed OTC treatments for symptoms and will send in prescription for cough relief.  Discussed pushing fluids and reasons to seek emergency care.    Asthma:  She denies any shortness of breath. She does say she is using her inhalers as prescribed.  She says that she is not wheezing she just has a cough. She is currently taking albuterol prn and Breo Ellipta daily.   Patient Active Problem List   Diagnosis Date Noted   Neutropenia, unspecified type (HCC) 11/25/2020   Intractable migraine with aura without status migrainosus 04/18/2020   Body mass index (BMI) 31.0-31.9, adult 02/06/2020   Chronic left hip pain 12/26/2019   Anemia 09/22/2017   S/P knee replacement 07/28/2017   Allergic rhinitis 05/26/2017   Vitamin D deficiency 05/19/2017   Chronic nonintractable headache 05/18/2017   Advanced care planning/counseling  discussion    Gastroesophageal reflux disease    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HCC) 03/02/2017   Insomnia 11/29/2016   Chronic right-sided low back pain with right-sided sciatica 04/19/2016   Abdominal wall pain in right flank 03/29/2016   Diverticulosis 02/24/2016   Hypercholesterolemia 06/16/2015   Chronic pain 04/13/2015   Neck pain 02/26/2015   Status post lumbar surgery 02/26/2015   Abnormal ECG 01/27/2015   Angina pectoris (HCC) 01/27/2015   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) 01/27/2015   Essential (primary) hypertension 01/27/2015    Social History   Tobacco Use   Smoking status: Former    Types: Cigarettes    Quit date: 2006    Years since quitting: 16.8   Smokeless tobacco: Never   Tobacco comments:    quit smoking 34yrs ago  Substance Use Topics   Alcohol use: No     Current Outpatient Medications:    albuterol (VENTOLIN HFA) 108 (90 Base) MCG/ACT inhaler, Inhale 1 puff into the lungs every 6 (six) hours as needed for wheezing or shortness of breath., Disp: 18 g, Rfl: 2   ascorbic acid (VITAMIN C) 500 MG tablet, Take 500 mg by mouth daily., Disp: , Rfl:    aspirin 81 MG EC tablet, Take 1 tablet by mouth daily., Disp: , Rfl:    azelastine (ASTELIN) 0.1 % nasal spray, PLACE 1 SPRAY INTO BOTH NOSTRILS 2 (TWO) TIMES DAILY. USE IN EACH NOSTRIL AS DIRECTED, Disp: 90 mL,  Rfl: 1   Blood Glucose Monitoring Suppl (TRUE METRIX AIR GLUCOSE METER) DEVI, True Metrix Glucose Meter, Disp: , Rfl:    citalopram (CELEXA) 20 MG tablet, Take 2 tablets (40 mg total) by mouth daily., Disp: 90 tablet, Rfl: 0   Dulaglutide (TRULICITY) 0.75 MG/0.5ML SOPN, Inject 0.75 mg into the skin once a week. Via [employer] Patient Assistance, Disp: 6 mL, Rfl: 1   esomeprazole (NEXIUM) 40 MG capsule, Take 1 capsule (40 mg total) by mouth daily. Follow up in office for more refills, Disp: 90 capsule, Rfl: 1   fluticasone (FLONASE) 50 MCG/ACT nasal spray, PLACE 2 SPRAYS  INTO BOTH NOSTRILS EVERY EVENING., Disp: 48 mL, Rfl: 1   fluticasone furoate-vilanterol (BREO ELLIPTA) 100-25 MCG/INH AEPB, Inhale 1 puff into the lungs daily., Disp: , Rfl:    Galcanezumab-gnlm (EMGALITY) 120 MG/ML SOAJ, Inject 120 mg into the skin every 30 (thirty) days., Disp: 1.12 mL, Rfl: 3   levocetirizine (XYZAL) 5 MG tablet, Take 1 tablet (5 mg total) by mouth every evening., Disp: 30 tablet, Rfl: 2   losartan (COZAAR) 25 MG tablet, TAKE 1 TABLET EVERY DAY (Patient taking differently: Take 25 mg by mouth daily.), Disp: 90 tablet, Rfl: 3   methocarbamol (ROBAXIN) 500 MG tablet, Take 500 mg by mouth every 8 (eight) hours as needed for muscle spasms., Disp: , Rfl:    montelukast (SINGULAIR) 10 MG tablet, TAKE 1 TABLET BY MOUTH EVERYDAY AT BEDTIME, Disp: 90 tablet, Rfl: 0   Multiple Vitamins-Minerals (MULTIVITAMIN WITH MINERALS) tablet, Take 1 tablet by mouth daily., Disp: , Rfl:    Oxycodone HCl 10 MG TABS, , Disp: , Rfl:    propranolol (INDERAL) 10 MG tablet, TAKE 1 TABLET BY MOUTH THREE TIMES A DAY, Disp: 270 tablet, Rfl: 3   rosuvastatin (CRESTOR) 40 MG tablet, TAKE 1 TABLET BY MOUTH EVERYDAY AT BEDTIME, Disp: 90 tablet, Rfl: 1   sodium chloride (OCEAN) 0.65 % SOLN nasal spray, Place 1 spray into both nostrils 2 (two) times daily as needed for congestion., Disp: 60 mL, Rfl: 5  Allergies  Allergen Reactions   Metformin And Related Diarrhea   Adhesive [Tape] Rash and Other (See Comments)    Regular tape is ok, allergy is to paper tape    I personally reviewed active problem list, medication list, allergies, notes from last encounter with the patient/caregiver today.  ROS  Constitutional: Negative for fever or weight change.  HEENT: positive for nasal congestion Respiratory: Positive for cough and negative for shortness of breath.   Cardiovascular: Negative for chest pain or palpitations.  Gastrointestinal: Negative for abdominal pain, no bowel changes.  Musculoskeletal: Negative  for gait problem or joint swelling.  Skin: Negative for rash.  Neurological: Negative for dizziness or headache.  No other specific complaints in a complete review of systems (except as listed in HPI above).   Objective  Virtual encounter, vitals not obtained.  There is no height or weight on file to calculate BMI.  Nursing Note and Vital Signs reviewed.  Physical Exam  Awake, alert and oriented  No results found for this or any previous visit (from the past 72 hour(s)).  Assessment & Plan  1. Viral upper respiratory tract infection -continue OTC treatments for symptoms -push fluids -use albuterol as needed - guaiFENesin-dextromethorphan (ROBITUSSIN DM) 100-10 MG/5ML syrup; Take 10 mLs by mouth every 4 (four) hours as needed for cough.  Dispense: 118 mL; Refill: 0  2. Moderate persistent asthma without complication -follow current treatment plan  -  Red flags and when to present for emergency care or RTC including fever >101.49F, chest pain, shortness of breath, new/worsening/un-resolving symptoms, reviewed with patient at time of visit. Follow up and care instructions discussed and provided in AVS. - I discussed the assessment and treatment plan with the patient. The patient was provided an opportunity to ask questions and all were answered. The patient agreed with the plan and demonstrated an understanding of the instructions.  I provided 15 minutes of non-face-to-face time during this encounter.  [name], [name]

## 2021-07-27 ENCOUNTER — Other Ambulatory Visit: Payer: Self-pay

## 2021-07-27 ENCOUNTER — Ambulatory Visit
Admission: RE | Admit: 2021-07-27 | Discharge: 2021-07-27 | Disposition: A | Discharge status: Home / Self Care | Payer: Medicare HMO | Source: Ambulatory Visit | Attending: Neurosurgery | Admitting: Neurosurgery

## 2021-07-27 DIAGNOSIS — M545 Low back pain, unspecified: Secondary | ICD-10-CM | POA: Diagnosis not present

## 2021-07-27 DIAGNOSIS — M2578 Osteophyte, vertebrae: Secondary | ICD-10-CM | POA: Diagnosis not present

## 2021-07-27 DIAGNOSIS — M79605 Pain in left leg: Secondary | ICD-10-CM | POA: Diagnosis not present

## 2021-07-27 DIAGNOSIS — M5416 Radiculopathy, lumbar region: Secondary | ICD-10-CM | POA: Insufficient documentation

## 2021-07-28 DIAGNOSIS — I10 Essential (primary) hypertension: Secondary | ICD-10-CM | POA: Diagnosis not present

## 2021-07-28 DIAGNOSIS — Z6831 Body mass index (BMI) 31.0-31.9, adult: Secondary | ICD-10-CM | POA: Diagnosis not present

## 2021-07-28 DIAGNOSIS — M5416 Radiculopathy, lumbar region: Secondary | ICD-10-CM | POA: Diagnosis not present

## 2021-07-29 ENCOUNTER — Telehealth: Payer: Self-pay

## 2021-07-29 NOTE — Progress Notes (Signed)
Chronic Care Management Pharmacy Assistant   Name: Lori Potts  MRN: [id number] DOB: 1946/04/10  Reason for Encounter:Diabetes Disease State Call, Update on Trulicity renewal application for year 2023.   Recent office visits:  07/12/2021 Lori Goo [name] (PCP) start guaiFENesin-dextromethorphan 100-10 mg/5ML PRN  07/08/2021 Lori Goo [name] (PCP) Start Emgality 120 MG/ML SOAJ Inject 120 mg into the skin every 30 (thirty) days, Change Citalopram 40 mg daily, lab work completed, Return in about 4 weeks   Recent consult visits:  No recent consult visit  Hospital visits:  None in previous 6 months  Medications: Outpatient Encounter Medications as of 07/29/2021  Medication Sig   albuterol (VENTOLIN HFA) 108 (90 Base) MCG/ACT inhaler Inhale 1 puff into the lungs every 6 (six) hours as needed for wheezing or shortness of breath.   ascorbic acid (VITAMIN C) 500 MG tablet Take 500 mg by mouth daily.   aspirin 81 MG EC tablet Take 1 tablet by mouth daily.   azelastine (ASTELIN) 0.1 % nasal spray PLACE 1 SPRAY INTO BOTH NOSTRILS 2 (TWO) TIMES DAILY. USE IN EACH NOSTRIL AS DIRECTED   Blood Glucose Monitoring Suppl (TRUE METRIX AIR GLUCOSE METER) DEVI True Metrix Glucose Meter   citalopram (CELEXA) 20 MG tablet Take 2 tablets (40 mg total) by mouth daily.   Dulaglutide (TRULICITY) 0.75 MG/0.5ML SOPN Inject 0.75 mg into the skin once a week. Via [employer] Patient Assistance   esomeprazole (NEXIUM) 40 MG capsule Take 1 capsule (40 mg total) by mouth daily. Follow up in office for more refills   fluticasone (FLONASE) 50 MCG/ACT nasal spray PLACE 2 SPRAYS INTO BOTH NOSTRILS EVERY EVENING.   fluticasone furoate-vilanterol (BREO ELLIPTA) 100-25 MCG/INH AEPB Inhale 1 puff into the lungs daily.   Galcanezumab-gnlm (EMGALITY) 120 MG/ML SOAJ Inject 120 mg into the skin every 30 (thirty) days.   guaiFENesin-dextromethorphan (ROBITUSSIN DM) 100-10 MG/5ML syrup Take 10 mLs by mouth every 4 (four)  hours as needed for cough.   levocetirizine (XYZAL) 5 MG tablet Take 1 tablet (5 mg total) by mouth every evening.   losartan (COZAAR) 25 MG tablet TAKE 1 TABLET EVERY DAY (Patient taking differently: Take 25 mg by mouth daily.)   methocarbamol (ROBAXIN) 500 MG tablet Take 500 mg by mouth every 8 (eight) hours as needed for muscle spasms.   montelukast (SINGULAIR) 10 MG tablet TAKE 1 TABLET BY MOUTH EVERYDAY AT BEDTIME   Multiple Vitamins-Minerals (MULTIVITAMIN WITH MINERALS) tablet Take 1 tablet by mouth daily.   Oxycodone HCl 10 MG TABS    propranolol (INDERAL) 10 MG tablet TAKE 1 TABLET BY MOUTH THREE TIMES A DAY   rosuvastatin (CRESTOR) 40 MG tablet TAKE 1 TABLET BY MOUTH EVERYDAY AT BEDTIME   sodium chloride (OCEAN) 0.65 % SOLN nasal spray Place 1 spray into both nostrils 2 (two) times daily as needed for congestion.   No facility-administered encounter medications on file as of 07/29/2021.    Care Gaps: Shingrix Vaccine Opthalmology exam   Star Rating Drugs: Rosuvastatin 40 mg last filled 02/10/2021 for [facility] day supply at [facility] Losartan 25 mg last filled 09/15/2020 for 90 day supply at [facility] Trulicity 0.75 mg last filled 11/30/2020 for 84 day supply at [facility] (receives from [employer]) Medication Fill Gaps: Citalopram 20 MG last filled 11/22/2020 90 day supply  Recent Relevant Labs: Lab Results  Component Value Date/Time   HGBA1C 6.9 (H) 07/08/2021 03:15 PM   HGBA1C 7.2 (A) 11/25/2020 08:04 AM   HGBA1C 7.2 (H) 11/14/2019  12:06 PM   HGBA1C 6.6 03/31/2019 12:00 AM   MICROALBUR 5.2 05/14/2019 12:00 AM   MICROALBUR 3.8 05/09/2018 09:50 AM   MICROALBUR 20 07/04/2016 10:36 AM    Kidney Function Lab Results  Component Value Date/Time   CREATININE 0.81 07/08/2021 03:15 PM   CREATININE 0.64 11/25/2020 08:39 AM   GFRNONAA >60 09/14/2020 09:37 AM   GFRNONAA 87 02/18/2020 09:37 AM   GFRAA 101 02/18/2020 09:37 AM    Current antihyperglycemic regimen:   Trulicity 0.75 mg weekly Tuesday What recent interventions/DTPs have been made to improve glycemic control:  None ID Have there been any recent hospitalizations or ED visits since last visit with CPP? No Patient reports hypoglycemic symptoms, including Sweaty Patient states she will break out in a sweat out of no where. Patient denies hyperglycemic symptoms, including blurry vision, excessive thirst, fatigue, polyuria, and weakness How often are you checking your blood sugar?  Patient states she is currently attending a trial for the [place] device. What are your blood sugars ranging?  Patient states her blood sugar usually ranges around 100-150, but there has been a few times where her blood sugar drop below 70, the lowest was 59.Patient states when her blood sugar is below 70, it is because she has not ate any thing.Patient states once she eats, her blood sugar will go back up to normal. During the week, how often does your blood glucose drop below 70? Twice a week Are you checking your feet daily/regularly?   Patient denies any pain, numbness or tingling sensations in her feet.   Adherence Review: Is the patient currently on a STATIN medication? Yes Is the patient currently on ACE/ARB medication? Yes Does the patient have >5 day gap between last estimated fill dates? Yes  Telephone follow up appointment with Care management team member scheduled for : 11/24/2020 at 8:30 am.  Patient application renewal for Trulicity for year 2023:  Patient states she has not received anything from [employer] regarding her renewal for year 2023.  I reach out to [facility] to get a update on patient applications for year 2023.Per [facility], they have not received a renewal application for year 2023.Notified Clinical pharmacist to please refax patient application to [facility] for them to process.   Per Clinical Pharmacist, he will fax her application over again.  Patient verbalized  understanding.  [name] Clinical Pharmacist Assistant [phone]

## 2021-08-05 ENCOUNTER — Ambulatory Visit: Payer: Medicare HMO | Admitting: Nurse Practitioner

## 2021-08-13 ENCOUNTER — Telehealth (INDEPENDENT_AMBULATORY_CARE_PROVIDER_SITE_OTHER): Payer: Medicare HMO | Admitting: Nurse Practitioner

## 2021-08-13 DIAGNOSIS — F419 Anxiety disorder, unspecified: Secondary | ICD-10-CM | POA: Diagnosis not present

## 2021-08-13 MED ORDER — CITALOPRAM HYDROBROMIDE 40 MG PO TABS: 40.0000 mg | ORAL_TABLET | Freq: Every day | ORAL | 1 refills | Status: DC

## 2021-08-13 NOTE — Progress Notes (Signed)
Name: Lori Potts   MRN: [id number]    DOB: 31-Mar-1946   Date:08/13/2021       Progress Note  Subjective  Chief Complaint  Chief Complaint  Patient presents with   Follow-up   Anxiety    I connected with  [name]  on 08/13/21 at 2:05 pm by a telephone enabled telemedicine application and verified that I am speaking with the correct person using two identifiers.  I discussed the limitations of evaluation and management by telemedicine and the availability of in person appointments. The patient expressed understanding and agreed to proceed with a virtual visit  Staff also discussed with the patient that there may be a patient responsible charge related to this service. Patient Location: work Dispensing optician: [facility] Additional Individuals present: alone  HPI  Anxiety: Last visit her GAD score was 11. She was taking Celexa 20 mg and we increased her dose to Celexa 40 mg daily.  Her GAD score is 1 today.  She reports that she is doing much better. She says she can tell if she misses a dose. She says otherwise she is doing well. Will continue with current treatment plan.     GAD 7 : Generalized Anxiety Score 08/13/2021 07/08/2021 03/25/2021 11/25/2020  Nervous, Anxious, on Edge 1 2 0 0  Control/stop worrying 0 2 0 0  Worry too much - different things 0 2 0 0  Trouble relaxing 0 2 0 0  Restless 0 1 0 0  Easily annoyed or irritable 0 1 0 0  Afraid - awful might happen 0 1 0 0  Total GAD 7 Score 1 11 0 0  Anxiety Difficulty Not difficult at all - Not difficult at all Not difficult at all     Depression screen [facility] 2/9 08/13/2021 07/12/2021 07/08/2021 05/10/2021 03/31/2021  Decreased Interest 0 0 0 3 0  Down, Depressed, Hopeless 0 0 0 3 0  PHQ - 2 Score 0 0 0 6 0  Altered sleeping 0 - 0 0 0  Tired, decreased energy 0 - 0 0 0  Change in appetite 0 - 0 0 0  Feeling bad or failure about yourself  0 - 0 0 0  Trouble concentrating 0 - 0 0 0  Moving slowly or fidgety/restless 0 -  0 0 0  Suicidal thoughts 0 - 0 0 0  PHQ-9 Score 0 - 0 6 0  Difficult doing work/chores Not difficult at all - Not difficult at all - Not difficult at all  Some recent data might be hidden     Patient Active Problem List   Diagnosis Date Noted   Neutropenia, unspecified type (HCC) 11/25/2020   Intractable migraine with aura without status migrainosus 04/18/2020   Body mass index (BMI) 31.0-31.9, adult 02/06/2020   Chronic left hip pain 12/26/2019   Anemia 09/22/2017   S/P knee replacement 07/28/2017   Allergic rhinitis 05/26/2017   Vitamin D deficiency 05/19/2017   Chronic nonintractable headache 05/18/2017   Advanced care planning/counseling discussion    Gastroesophageal reflux disease    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HCC) 03/02/2017   Insomnia 11/29/2016   Chronic right-sided low back pain with right-sided sciatica 04/19/2016   Abdominal wall pain in right flank 03/29/2016   Diverticulosis 02/24/2016   Hypercholesterolemia 06/16/2015   Chronic pain 04/13/2015   Neck pain 02/26/2015   Status post lumbar surgery 02/26/2015   Abnormal ECG 01/27/2015   Angina pectoris (HCC) 01/27/2015   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) 01/27/2015   Essential (primary) hypertension 01/27/2015    Social History   Tobacco Use   Smoking status: Former    Types: Cigarettes    Quit date: 2006    Years since quitting: 16.9   Smokeless tobacco: Never   Tobacco comments:    quit smoking 41yrs ago  Substance Use Topics   Alcohol use: No     Current Outpatient Medications:    albuterol (VENTOLIN HFA) 108 (90 Base) MCG/ACT inhaler, Inhale 1 puff into the lungs every 6 (six) hours as needed for wheezing or shortness of breath., Disp: 18 g, Rfl: 2   ascorbic acid (VITAMIN C) 500 MG tablet, Take 500 mg by mouth daily., Disp: , Rfl:    aspirin 81 MG EC tablet, Take 1 tablet by mouth daily., Disp: , Rfl:    azelastine (ASTELIN) 0.1 % nasal spray, PLACE 1 SPRAY  INTO BOTH NOSTRILS 2 (TWO) TIMES DAILY. USE IN EACH NOSTRIL AS DIRECTED, Disp: 90 mL, Rfl: 1   Blood Glucose Monitoring Suppl (TRUE METRIX AIR GLUCOSE METER) DEVI, True Metrix Glucose Meter, Disp: , Rfl:    citalopram (CELEXA) 20 MG tablet, Take 2 tablets (40 mg total) by mouth daily., Disp: 90 tablet, Rfl: 0   Dulaglutide (TRULICITY) 0.75 MG/0.5ML SOPN, Inject 0.75 mg into the skin once a week. Via [employer] Patient Assistance, Disp: 6 mL, Rfl: 1   esomeprazole (NEXIUM) 40 MG capsule, Take 1 capsule (40 mg total) by mouth daily. Follow up in office for more refills, Disp: 90 capsule, Rfl: 1   fluticasone (FLONASE) 50 MCG/ACT nasal spray, PLACE 2 SPRAYS INTO BOTH NOSTRILS EVERY EVENING., Disp: 48 mL, Rfl: 1   fluticasone furoate-vilanterol (BREO ELLIPTA) 100-25 MCG/INH AEPB, Inhale 1 puff into the lungs daily., Disp: , Rfl:    Galcanezumab-gnlm (EMGALITY) 120 MG/ML SOAJ, Inject 120 mg into the skin every 30 (thirty) days., Disp: 1.12 mL, Rfl: 3   guaiFENesin-dextromethorphan (ROBITUSSIN DM) 100-10 MG/5ML syrup, Take 10 mLs by mouth every 4 (four) hours as needed for cough., Disp: 118 mL, Rfl: 0   levocetirizine (XYZAL) 5 MG tablet, Take 1 tablet (5 mg total) by mouth every evening., Disp: 30 tablet, Rfl: 2   losartan (COZAAR) 25 MG tablet, TAKE 1 TABLET EVERY DAY (Patient taking differently: Take 25 mg by mouth daily.), Disp: 90 tablet, Rfl: 3   methocarbamol (ROBAXIN) 500 MG tablet, Take 500 mg by mouth every 8 (eight) hours as needed for muscle spasms., Disp: , Rfl:    montelukast (SINGULAIR) 10 MG tablet, TAKE 1 TABLET BY MOUTH EVERYDAY AT BEDTIME, Disp: 90 tablet, Rfl: 0   Multiple Vitamins-Minerals (MULTIVITAMIN WITH MINERALS) tablet, Take 1 tablet by mouth daily., Disp: , Rfl:    Oxycodone HCl 10 MG TABS, , Disp: , Rfl:    propranolol (INDERAL) 10 MG tablet, TAKE 1 TABLET BY MOUTH THREE TIMES A DAY, Disp: 270 tablet, Rfl: 3   rosuvastatin (CRESTOR) 40 MG tablet, TAKE 1 TABLET BY MOUTH  EVERYDAY AT BEDTIME, Disp: 90 tablet, Rfl: 1   sodium chloride (OCEAN) 0.65 % SOLN nasal spray, Place 1 spray into both nostrils 2 (two) times daily as needed for congestion., Disp: 60 mL, Rfl: 5  Allergies  Allergen Reactions   Metformin And Related Diarrhea   Adhesive [Tape] Rash and Other (See Comments)    Regular tape is ok, allergy is to paper tape    I personally reviewed active problem list, medication list, allergies, notes from last  encounter with the patient/caregiver today.  ROS  Constitutional: Negative for fever or weight change.  Respiratory: Negative for cough and shortness of breath.   Cardiovascular: Negative for chest pain or palpitations.  Gastrointestinal: Negative for abdominal pain, no bowel changes.  Musculoskeletal: Negative for gait problem or joint swelling.  Skin: Negative for rash.  Neurological: Negative for dizziness or headache.  No other specific complaints in a complete review of systems (except as listed in HPI above).   Objective  Virtual encounter, vitals not obtained.  There is no height or weight on file to calculate BMI.  Nursing Note and Vital Signs reviewed.  Physical Exam  Awake, alert and oriented  No results found for this or any previous visit (from the past 72 hour(s)).  Assessment & Plan  1. Anxiety -continue with current treatment plan - citalopram (CELEXA) 40 MG tablet; Take 1 tablet (40 mg total) by mouth daily.  Dispense: 90 tablet; Refill: 1 .  -Red flags and when to present for emergency care or RTC including fever >101.40F, chest pain, shortness of breath, new/worsening/un-resolving symptoms, reviewed with patient at time of visit. Follow up and care instructions discussed and provided in AVS. - I discussed the assessment and treatment plan with the patient. The patient was provided an opportunity to ask questions and all were answered. The patient agreed with the plan and demonstrated an understanding of the  instructions.  I provided 15 minutes of non-face-to-face time during this encounter.  [name], [name]

## 2021-08-24 ENCOUNTER — Telehealth (INDEPENDENT_AMBULATORY_CARE_PROVIDER_SITE_OTHER): Payer: Medicare HMO | Admitting: Nurse Practitioner

## 2021-08-24 ENCOUNTER — Other Ambulatory Visit: Payer: Self-pay

## 2021-08-24 ENCOUNTER — Encounter: Payer: Self-pay | Admitting: Nurse Practitioner

## 2021-08-24 DIAGNOSIS — J4520 Mild intermittent asthma, uncomplicated: Secondary | ICD-10-CM

## 2021-08-24 DIAGNOSIS — J069 Acute upper respiratory infection, unspecified: Secondary | ICD-10-CM

## 2021-08-24 DIAGNOSIS — R509 Fever, unspecified: Secondary | ICD-10-CM | POA: Diagnosis not present

## 2021-08-24 LAB — POCT INFLUENZA A/B
Influenza A, POC: NEGATIVE
Influenza B, POC: NEGATIVE

## 2021-08-24 MED ORDER — PROMETHAZINE-DM 6.25-15 MG/5ML PO SYRP: 5.0000 mL | ORAL_SOLUTION | Freq: Four times a day (QID) | ORAL | 0 refills | Status: DC | PRN

## 2021-08-24 NOTE — Progress Notes (Signed)
Name: Lori Potts   MRN: [id number]    DOB: 10-18-1945   Date:08/24/2021       Progress Note  Subjective  Chief Complaint  Chief Complaint  Patient presents with   URI    Cough, congested, hoarse, headache    I connected with  Lori Potts  on 08/24/21 at  4:00 PM EST by a video enabled telemedicine application and verified that I am speaking with the correct person using two identifiers.  I discussed the limitations of evaluation and management by telemedicine and the availability of in person appointments. The patient expressed understanding and agreed to proceed with a virtual visit  Staff also discussed with the patient that there may be a patient responsible charge related to this service. Patient Location: home Provider Location: [facility] Additional Individuals present: alone  HPI  URI: She says her symptoms started Monday night.  She says she has had scratchy throat, cough, nasal congestion.  She denies any fever or shortness of breath.  She says she took a home Covid test and it was negative.  Discussed coming for a flu test and she is going to come by for that.  Discussed OTC treatments for symptoms.  Push fluids and get plenty of rest. She says she has been taking robitussin and it has not helped at all.  Her flu test was negative.   Asthma: She denies any trouble breathing. She says her breathing is fine, and denies any wheezing.  She says she has been using her Breo every day and uses her albuterol inhaler every once in awhile.    Patient Active Problem List   Diagnosis Date Noted   Neutropenia, unspecified type ([place]) 11/25/2020   Intractable migraine with aura without status migrainosus 04/18/2020   Body mass index (BMI) 31.0-31.9, adult 02/06/2020   Chronic left hip pain 12/26/2019   Anemia 09/22/2017   S/P knee replacement 07/28/2017   Allergic rhinitis 05/26/2017   Vitamin D deficiency 05/19/2017   Chronic nonintractable headache 05/18/2017   Advanced care  planning/counseling discussion    Gastroesophageal reflux disease    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[place]) 03/02/2017   Insomnia 11/29/2016   Chronic right-sided low back pain with right-sided sciatica 04/19/2016   Abdominal wall pain in right flank 03/29/2016   Diverticulosis 02/24/2016   Hypercholesterolemia 06/16/2015   Chronic pain 04/13/2015   Neck pain 02/26/2015   Status post lumbar surgery 02/26/2015   Abnormal ECG 01/27/2015   Angina pectoris ([place]) 01/27/2015   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[place]) 01/27/2015   Essential (primary) hypertension 01/27/2015    Social History   Tobacco Use   Smoking status: Former    Types: Cigarettes    Quit date: 2006    Years since quitting: 17.0   Smokeless tobacco: Never   Tobacco comments:    quit smoking 39yrs ago  Substance Use Topics   Alcohol use: No     Current Outpatient Medications:    albuterol (VENTOLIN HFA) 108 (90 Base) MCG/ACT inhaler, Inhale 1 puff into the lungs every 6 (six) hours as needed for wheezing or shortness of breath., Disp: 18 g, Rfl: 2   ascorbic acid (VITAMIN C) 500 MG tablet, Take 500 mg by mouth daily., Disp: , Rfl:    aspirin 81 MG EC tablet, Take 1 tablet by mouth daily., Disp: , Rfl:    azelastine (ASTELIN) 0.1 % nasal spray, PLACE 1 SPRAY INTO BOTH NOSTRILS 2 (TWO) TIMES DAILY. USE IN  EACH NOSTRIL AS DIRECTED, Disp: 90 mL, Rfl: 1   Blood Glucose Monitoring Suppl (TRUE METRIX AIR GLUCOSE METER) DEVI, True Metrix Glucose Meter, Disp: , Rfl:    citalopram (CELEXA) 40 MG tablet, Take 1 tablet (40 mg total) by mouth daily., Disp: 90 tablet, Rfl: 1   Dulaglutide (TRULICITY) [id number] [id number] SOPN, Inject 0.75 mg into the skin once a week. [place] Patient Assistance, Disp: 6 mL, Rfl: 1   esomeprazole (NEXIUM) 40 MG capsule, Take 1 capsule (40 mg total) by mouth daily. Follow up in office for more refills, Disp: 90 capsule, Rfl: 1   fluticasone (FLONASE) 50 MCG/ACT nasal  spray, PLACE 2 SPRAYS INTO BOTH NOSTRILS EVERY EVENING., Disp: 48 mL, Rfl: 1   fluticasone furoate-vilanterol (BREO ELLIPTA) 100-25 MCG/INH AEPB, Inhale 1 puff into the lungs daily., Disp: , Rfl:    Galcanezumab-gnlm (EMGALITY) 120 MG/ML SOAJ, Inject 120 mg into the skin every 30 (thirty) days., Disp: 1.12 mL, Rfl: 3   guaiFENesin-dextromethorphan (ROBITUSSIN DM) 100-10 MG/5ML syrup, Take 10 mLs by mouth every 4 (four) hours as needed for cough., Disp: 118 mL, Rfl: 0   levocetirizine (XYZAL) 5 MG tablet, Take 1 tablet (5 mg total) by mouth every evening., Disp: 30 tablet, Rfl: 2   losartan (COZAAR) 25 MG tablet, TAKE 1 TABLET EVERY DAY (Patient taking differently: Take 25 mg by mouth daily.), Disp: 90 tablet, Rfl: 3   methocarbamol (ROBAXIN) 500 MG tablet, Take 500 mg by mouth every 8 (eight) hours as needed for muscle spasms., Disp: , Rfl:    montelukast (SINGULAIR) 10 MG tablet, TAKE 1 TABLET BY MOUTH EVERYDAY AT BEDTIME, Disp: 90 tablet, Rfl: 0   Multiple Vitamins-Minerals (MULTIVITAMIN WITH MINERALS) tablet, Take 1 tablet by mouth daily., Disp: , Rfl:    Oxycodone HCl 10 MG TABS, , Disp: , Rfl:    propranolol (INDERAL) 10 MG tablet, TAKE 1 TABLET BY MOUTH THREE TIMES A DAY, Disp: 270 tablet, Rfl: 3   rosuvastatin (CRESTOR) 40 MG tablet, TAKE 1 TABLET BY MOUTH EVERYDAY AT BEDTIME, Disp: 90 tablet, Rfl: 1   sodium chloride (OCEAN) 0.65 % SOLN nasal spray, Place 1 spray into both nostrils 2 (two) times daily as needed for congestion., Disp: 60 mL, Rfl: 5  Allergies  Allergen Reactions   Metformin And Related Diarrhea   Adhesive [Tape] Rash and Other (See Comments)    Regular tape is ok, allergy is to paper tape    I personally reviewed active problem list, medication list, allergies, notes from last encounter with the patient/caregiver today.  ROS  Constitutional: Negative for fever or weight change.  HEENT: positive for nasal congestion Respiratory: Positive for cough and negative for  shortness of breath.   Cardiovascular: Negative for chest pain or palpitations.  Gastrointestinal: Negative for abdominal pain, no bowel changes.  Musculoskeletal: Negative for gait problem or joint swelling.  Skin: Negative for rash.  Neurological: Negative for dizziness or headache.  No other specific complaints in a complete review of systems (except as listed in HPI above).   Objective  Virtual encounter, vitals not obtained.  There is no height or weight on file to calculate BMI.  Nursing Note and Vital Signs reviewed.  Physical Exam  Awake, alert and oriented, speaking in complete sentences  No results found for this or any previous visit (from the past 72 hour(s)).  Assessment & Plan  1. Viral upper respiratory tract infection - OTC treatments for symptoms -push fluids and get rest - POCT  Influenza A/B-negative - promethazine-dextromethorphan (PROMETHAZINE-DM) 6.25-15 MG/5ML syrup; Take 5 mLs by mouth 4 (four) times daily as needed for cough.  Dispense: 118 mL; Refill: 0  2. Mild intermittent asthma without complication -continue current treatment  -Red flags and when to present for emergency care or RTC including fever >101.9F, chest pain, shortness of breath, new/worsening/un-resolving symptoms, reviewed with patient at time of visit. Follow up and care instructions discussed and provided in AVS. - I discussed the assessment and treatment plan with the patient. The patient was provided an opportunity to ask questions and all were answered. The patient agreed with the plan and demonstrated an understanding of the instructions.  I provided 20 minutes of non-face-to-face time during this encounter.  Bo Merino, [name]

## 2021-08-25 ENCOUNTER — Telehealth: Payer: Self-pay

## 2021-08-25 ENCOUNTER — Telehealth: Payer: Self-pay | Admitting: Emergency Medicine

## 2021-08-25 ENCOUNTER — Other Ambulatory Visit: Payer: Self-pay | Admitting: Nurse Practitioner

## 2021-08-25 DIAGNOSIS — G894 Chronic pain syndrome: Secondary | ICD-10-CM | POA: Diagnosis not present

## 2021-08-25 DIAGNOSIS — M461 Sacroiliitis, not elsewhere classified: Secondary | ICD-10-CM | POA: Diagnosis not present

## 2021-08-25 DIAGNOSIS — E119 Type 2 diabetes mellitus without complications: Secondary | ICD-10-CM | POA: Diagnosis not present

## 2021-08-25 DIAGNOSIS — E1129 Type 2 diabetes mellitus with other diabetic kidney complication: Secondary | ICD-10-CM

## 2021-08-25 DIAGNOSIS — M25552 Pain in left hip: Secondary | ICD-10-CM | POA: Diagnosis not present

## 2021-08-25 MED ORDER — FREESTYLE LIBRE 3 SENSOR MISC: 1.0000 | 11 refills | Status: DC

## 2021-08-25 NOTE — Progress Notes (Signed)
Chronic Care Management Pharmacy Assistant   Name: Lori Potts  MRN: [id number] DOB: [birth year]  Reason for Encounter: Medication Review/Patinet assistance application renewal status:   Recent office visits:  08/24/2021 Lori Goo [name] (PCP) Start promethazine-dextromethorphan (PROMETHAZINE-DM) 6.25-15 MG/5ML syrup; Take 5 mLs by mouth 4 (four) times daily as needed for cough 08/13/2021 Lori Goo [name] (PCP) Increase Celexa 40 mg daily  Recent consult visits:  No Consult visits.  Hospital visits:  None in previous 6 months  Medications: Outpatient Encounter Medications as of 08/25/2021  Medication Sig   albuterol (VENTOLIN HFA) 108 (90 Base) MCG/ACT inhaler Inhale 1 puff into the lungs every 6 (six) hours as needed for wheezing or shortness of breath.   ascorbic acid (VITAMIN C) 500 MG tablet Take 500 mg by mouth daily.   aspirin 81 MG EC tablet Take 1 tablet by mouth daily.   azelastine (ASTELIN) 0.1 % nasal spray PLACE 1 SPRAY INTO BOTH NOSTRILS 2 (TWO) TIMES DAILY. USE IN EACH NOSTRIL AS DIRECTED   Blood Glucose Monitoring Suppl (TRUE METRIX AIR GLUCOSE METER) DEVI True Metrix Glucose Meter   citalopram (CELEXA) 40 MG tablet Take 1 tablet (40 mg total) by mouth daily.   Continuous Blood Gluc Sensor (FREESTYLE LIBRE 3 SENSOR) MISC 1 Device by Does not apply route every 14 (fourteen) days.   Dulaglutide (TRULICITY) 0.75 MG/0.5ML SOPN Inject 0.75 mg into the skin once a week. Via [employer] Patient Assistance   esomeprazole (NEXIUM) 40 MG capsule Take 1 capsule (40 mg total) by mouth daily. Follow up in office for more refills   fluticasone (FLONASE) 50 MCG/ACT nasal spray PLACE 2 SPRAYS INTO BOTH NOSTRILS EVERY EVENING.   fluticasone furoate-vilanterol (BREO ELLIPTA) 100-25 MCG/INH AEPB Inhale 1 puff into the lungs daily.   Galcanezumab-gnlm (EMGALITY) 120 MG/ML SOAJ Inject 120 mg into the skin every 30 (thirty) days.   levocetirizine (XYZAL) 5 MG tablet Take 1 tablet (5 mg  total) by mouth every evening.   losartan (COZAAR) 25 MG tablet TAKE 1 TABLET EVERY DAY (Patient taking differently: Take 25 mg by mouth daily.)   methocarbamol (ROBAXIN) 500 MG tablet Take 500 mg by mouth every 8 (eight) hours as needed for muscle spasms.   montelukast (SINGULAIR) 10 MG tablet TAKE 1 TABLET BY MOUTH EVERYDAY AT BEDTIME   Multiple Vitamins-Minerals (MULTIVITAMIN WITH MINERALS) tablet Take 1 tablet by mouth daily.   Oxycodone HCl 10 MG TABS    promethazine-dextromethorphan (PROMETHAZINE-DM) 6.25-15 MG/5ML syrup Take 5 mLs by mouth 4 (four) times daily as needed for cough.   propranolol (INDERAL) 10 MG tablet TAKE 1 TABLET BY MOUTH THREE TIMES A DAY   rosuvastatin (CRESTOR) 40 MG tablet TAKE 1 TABLET BY MOUTH EVERYDAY AT BEDTIME   sodium chloride (OCEAN) 0.65 % SOLN nasal spray Place 1 spray into both nostrils 2 (two) times daily as needed for congestion.   No facility-administered encounter medications on file as of 08/25/2021.    Care Gaps: Shingrix Vaccine Opthalmology exam    Star Rating Drugs: Rosuvastatin 40 mg last filled 06/30/2021 [facility] day [facility] Losartan 25 mg last filled 09/15/2020 [facility] day [facility] Trulicity 0.75 mg last filled 11/30/2020 for [facility] day supply at [facility] (receives from [employer]) Medication Fill Gaps: Citalopram 20 MG last filled 11/22/2020 90 day supply  Patient assistance renewal status update for Trulicity: Per Clinical Pharmacist, I do not see any paperwork faxed on or  in progress for her Trulicity. Did  she receive it in the mail? I am printing another copy out now and leaving at the front desk in case she needs it again.   Patient states she will come to her PCP office on Friday 08/27/2021 with proof of income, and complete her part.I informed the patient the clinical pharmacist is only at the office on Wednesday, so it may be until next Wednesday when he is able to fax it over to [facility].Patient  Verbalized understanding. Notified Clinical pharmacist.   [name] Clinical Pharmacist Assistant [phone]

## 2021-08-25 NOTE — Telephone Encounter (Signed)
Please send a script to pharmacy for [facility] 3.

## 2021-08-31 ENCOUNTER — Telehealth: Payer: Self-pay

## 2021-08-31 NOTE — Telephone Encounter (Signed)
Pt states she is not feeling any better. She is sweating, cough, congestion. Would like to know if you would call a z-pack to [facility]. Last seen 1.3.2023

## 2021-09-01 ENCOUNTER — Other Ambulatory Visit: Payer: Self-pay | Admitting: Nurse Practitioner

## 2021-09-01 DIAGNOSIS — R053 Chronic cough: Secondary | ICD-10-CM

## 2021-09-01 MED ORDER — AZITHROMYCIN 250 MG PO TABS: ORAL_TABLET | ORAL | 0 refills | Status: AC

## 2021-09-01 NOTE — Telephone Encounter (Signed)
Patient notified

## 2021-09-03 ENCOUNTER — Telehealth: Payer: Self-pay

## 2021-09-03 NOTE — Progress Notes (Signed)
Chronic Care Management Pharmacy Assistant   Name: Lori Potts  MRN: [id number] DOB: 04/02/1946  Reason for Encounter:Hypertension Disease State Call/Patient assistance application for Trulicity update.   Recent office visits:  No recent office visit  Recent consult visits:  No recent consult visit  Hospital visits:  None in previous 6 months  Medications: Outpatient Encounter Medications as of 09/03/2021  Medication Sig   albuterol (VENTOLIN HFA) 108 (90 Base) MCG/ACT inhaler Inhale 1 puff into the lungs every 6 (six) hours as needed for wheezing or shortness of breath.   ascorbic acid (VITAMIN C) 500 MG tablet Take 500 mg by mouth daily.   aspirin 81 MG EC tablet Take 1 tablet by mouth daily.   azelastine (ASTELIN) 0.1 % nasal spray PLACE 1 SPRAY INTO BOTH NOSTRILS 2 (TWO) TIMES DAILY. USE IN EACH NOSTRIL AS DIRECTED   azithromycin (ZITHROMAX) 250 MG tablet Take 2 tablets on day 1, then 1 tablet daily on days 2 through 5   Blood Glucose Monitoring Suppl (TRUE METRIX AIR GLUCOSE METER) DEVI True Metrix Glucose Meter   citalopram (CELEXA) 40 MG tablet Take 1 tablet (40 mg total) by mouth daily.   Continuous Blood Gluc Sensor (FREESTYLE LIBRE 3 SENSOR) MISC 1 Device by Does not apply route every 14 (fourteen) days.   Dulaglutide (TRULICITY) 0.75 MG/0.5ML SOPN Inject 0.75 mg into the skin once a week. Via [employer] Patient Assistance   esomeprazole (NEXIUM) 40 MG capsule Take 1 capsule (40 mg total) by mouth daily. Follow up in office for more refills   fluticasone (FLONASE) 50 MCG/ACT nasal spray PLACE 2 SPRAYS INTO BOTH NOSTRILS EVERY EVENING.   fluticasone furoate-vilanterol (BREO ELLIPTA) 100-25 MCG/INH AEPB Inhale 1 puff into the lungs daily.   Galcanezumab-gnlm (EMGALITY) 120 MG/ML SOAJ Inject 120 mg into the skin every 30 (thirty) days.   levocetirizine (XYZAL) 5 MG tablet Take 1 tablet (5 mg total) by mouth every evening.   losartan (COZAAR) 25 MG tablet TAKE 1 TABLET  EVERY DAY (Patient taking differently: Take 25 mg by mouth daily.)   methocarbamol (ROBAXIN) 500 MG tablet Take 500 mg by mouth every 8 (eight) hours as needed for muscle spasms.   montelukast (SINGULAIR) 10 MG tablet TAKE 1 TABLET BY MOUTH EVERYDAY AT BEDTIME   Multiple Vitamins-Minerals (MULTIVITAMIN WITH MINERALS) tablet Take 1 tablet by mouth daily.   Oxycodone HCl 10 MG TABS    promethazine-dextromethorphan (PROMETHAZINE-DM) 6.25-15 MG/5ML syrup Take 5 mLs by mouth 4 (four) times daily as needed for cough.   propranolol (INDERAL) 10 MG tablet TAKE 1 TABLET BY MOUTH THREE TIMES A DAY   rosuvastatin (CRESTOR) 40 MG tablet TAKE 1 TABLET BY MOUTH EVERYDAY AT BEDTIME   sodium chloride (OCEAN) 0.65 % SOLN nasal spray Place 1 spray into both nostrils 2 (two) times daily as needed for congestion.   No facility-administered encounter medications on file as of 09/03/2021.    Care Gaps: Shingrix Vaccine Opthalmology exam    Star Rating Drugs: Rosuvastatin 40 mg last filled [facility]30/2021 [facility] Losartan 25 mg last filled 09/15/2020 [facility] Trulicity 0.75 mg last filled 11/30/2020 [facility] (receives from [employer]) Medication Fill Gaps: Citalopram 20 MG last filled 11/22/2020 90 day supply  Reviewed chart prior to disease state call. Spoke with patient regarding BP  Recent Office Vitals: BP Readings from Last 3 Encounters:  07/08/21 128/76  03/25/21 138/76  01/16/21 Marland Kitchen)  142/71   Pulse Readings from Last 3 Encounters:  07/08/21 82  03/25/21 78  01/16/21 (!) 106    Wt Readings from Last 3 Encounters:  07/08/21 183 lb 3.2 oz (83.1 kg)  03/25/21 180 lb 9.6 oz (81.9 kg)  01/16/21 185 lb (83.9 kg)     Kidney Function Lab Results  Component Value Date/Time   CREATININE 0.81 07/08/2021 03:15 PM   CREATININE 0.64 11/25/2020 08:39 AM   GFRNONAA >60 09/14/2020 09:37 AM   GFRNONAA 87 02/18/2020 09:37 AM   GFRAA  101 02/18/2020 09:37 AM    BMP Latest Ref Rng & Units 07/08/2021 11/25/2020 09/14/2020  Glucose 65 - 99 mg/dL 250(N) 397(Q) 734(L)  BUN 7 - 25 mg/dL 18 15 17   Creatinine 0.60 - 1.00 mg/dL 9.37 9.02  BUN/Creat Ratio 6 - 22 (calc) NOT APPLICABLE NOT APPLICABLE -  Sodium 135 - 146 mmol/L 143 141 143  Potassium 3.5 - 5.3 mmol/L 4.1 4.4 3.6  Chloride 98 - 110 mmol/L 106 104 105  CO2 20 - 32 mmol/L 28 30 29   Calcium 8.6 - 10.4 mg/dL 4.09 9.8 9.6    Current antihypertensive regimen:  Losartan 25 mg daily  Propanolol 10 mg twice daily  How often are you checking your Blood Pressure? infrequently Current home BP readings:  Patient states she is unsure what her blood pressure ranges around, but states her blood pressure "is never high".  What recent interventions/DTPs have been made by any provider to improve Blood Pressure control since last CPP Visit: None ID  Any recent hospitalizations or ED visits since last visit with CPP? No  What diet changes have been made to improve Blood Pressure Control?  Patient states she limits her salt intake, and watches how much sodium she has.  What exercise is being done to improve your Blood Pressure Control?  Patient states she does not exercise.  Patient assistance update for Trulicity:  Patient states she was at her PCP office recently and they did not mention anything about the application, and she forgot to ask.Patient states she will go to her PCP office Monday 09/06/2021 and pick it up.  Adherence Review: Is the patient currently on ACE/ARB medication? Yes Does the patient have >5 day gap between last estimated fill dates? No  Telephone follow up appointment with Care management team member scheduled for : 11/24/2021 at 8:30 am.  09/08/2021 Clinical Pharmacist Assistant [phone]

## 2021-09-09 ENCOUNTER — Other Ambulatory Visit: Payer: Self-pay | Admitting: Family Medicine

## 2021-09-09 DIAGNOSIS — J4521 Mild intermittent asthma with (acute) exacerbation: Secondary | ICD-10-CM

## 2021-09-09 NOTE — Telephone Encounter (Signed)
Requested Prescriptions  Pending Prescriptions Disp Refills   montelukast (SINGULAIR) 10 MG tablet [Pharmacy Med Name: MONTELUKAST SOD 10 MG TABLET] 90 tablet 0    Sig: TAKE 1 TABLET BY MOUTH EVERYDAY AT BEDTIME     Pulmonology:  Leukotriene Inhibitors Passed - 09/09/2021  3:18 AM      Passed - Valid encounter within last 12 months    Recent Outpatient Visits          2 weeks ago Viral upper respiratory tract infection   [facility] [name], [name]   3 weeks ago Anxiety   [facility] [name], [name]   1 month ago Viral upper respiratory tract infection   [facility] [name], [name]   2 months ago Type 2 diabetes mellitus with microalbuminuria, without long-term current use of insulin [facility])   [facility] [name], [name]   4 months ago Cough   [facility] [name], [name]      Future Appointments            In 2 months [facility]

## 2021-09-13 ENCOUNTER — Other Ambulatory Visit: Payer: Self-pay

## 2021-09-13 ENCOUNTER — Telehealth (INDEPENDENT_AMBULATORY_CARE_PROVIDER_SITE_OTHER): Payer: Medicare HMO | Admitting: Nurse Practitioner

## 2021-09-13 ENCOUNTER — Telehealth: Payer: Self-pay | Admitting: Emergency Medicine

## 2021-09-13 ENCOUNTER — Encounter: Payer: Self-pay | Admitting: Nurse Practitioner

## 2021-09-13 DIAGNOSIS — R052 Subacute cough: Secondary | ICD-10-CM | POA: Diagnosis not present

## 2021-09-13 DIAGNOSIS — R062 Wheezing: Secondary | ICD-10-CM

## 2021-09-13 DIAGNOSIS — J4540 Moderate persistent asthma, uncomplicated: Secondary | ICD-10-CM

## 2021-09-13 DIAGNOSIS — Z87891 Personal history of nicotine dependence: Secondary | ICD-10-CM | POA: Diagnosis not present

## 2021-09-13 MED ORDER — TRELEGY ELLIPTA 100-62.5-25 MCG/ACT IN AEPB: 1.0000 | INHALATION_SPRAY | Freq: Every day | RESPIRATORY_TRACT | 11 refills | Status: DC

## 2021-09-13 NOTE — Progress Notes (Signed)
Name: Lori Potts   MRN: [id number]    DOB: 01-Sep-1945   Date:09/13/2021       Progress Note  Subjective  Chief Complaint  Chief Complaint  Patient presents with   Wheezing   Cough    I connected with  Lori Potts  on 09/13/21 at 11:20 AM EST by a video enabled telemedicine application and verified that I am speaking with the correct person using two identifiers.  I discussed the limitations of evaluation and management by telemedicine and the availability of in person appointments. The patient expressed understanding and agreed to proceed with a virtual visit  Staff also discussed with the patient that there may be a patient responsible charge related to this service. Patient Location: work Dispensing optician: [facility] Additional Individuals present: alone  HPI  Asthma/Wheezing/coughing: She says she feels like her asthma has gotten worse.  She says that after she had the URI she got better.  She says she has been wheezing for the last week or so. She says she has been using her albuterol inhaler and her BREO has been too expensive to get.  She denies any fever, sinus congestion.  She does say she is wheezing which is making her cough.  She says she feels winded but not short of breath. Discussed referral to pulmonology and she is agreeable to plan.  Will place referral.  Discussed doing a course of steroids and she does not want to do that because it makes her sugar go up.  Will give her a sample of trelegy.  Instructed on use.   Patient Active Problem List   Diagnosis Date Noted   Neutropenia, unspecified type (HCC) 11/25/2020   Intractable migraine with aura without status migrainosus 04/18/2020   Body mass index (BMI) 31.0-31.9, adult 02/06/2020   Chronic left hip pain 12/26/2019   Anemia 09/22/2017   S/P knee replacement 07/28/2017   Allergic rhinitis 05/26/2017   Vitamin D deficiency 05/19/2017   Chronic nonintractable headache 05/18/2017   Advanced care  planning/counseling discussion    Gastroesophageal reflux disease    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HCC) 03/02/2017   Insomnia 11/29/2016   Chronic right-sided low back pain with right-sided sciatica 04/19/2016   Abdominal wall pain in right flank 03/29/2016   Diverticulosis 02/24/2016   Hypercholesterolemia 06/16/2015   Chronic pain 04/13/2015   Neck pain 02/26/2015   Status post lumbar surgery 02/26/2015   Abnormal ECG 01/27/2015   Angina pectoris (HCC) 01/27/2015   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) 01/27/2015   Essential (primary) hypertension 01/27/2015    Social History   Tobacco Use   Smoking status: Former    Types: Cigarettes    Quit date: 2006    Years since quitting: 17.0   Smokeless tobacco: Never   Tobacco comments:    quit smoking 51yrs ago  Substance Use Topics   Alcohol use: No     Current Outpatient Medications:    albuterol (VENTOLIN HFA) 108 (90 Base) MCG/ACT inhaler, Inhale 1 puff into the lungs every 6 (six) hours as needed for wheezing or shortness of breath., Disp: 18 g, Rfl: 2   ascorbic acid (VITAMIN C) 500 MG tablet, Take 500 mg by mouth daily., Disp: , Rfl:    aspirin 81 MG EC tablet, Take 1 tablet by mouth daily., Disp: , Rfl:    azelastine (ASTELIN) 0.1 % nasal spray, PLACE 1 SPRAY INTO BOTH NOSTRILS 2 (TWO) TIMES DAILY. USE IN EACH NOSTRIL AS  DIRECTED, Disp: 90 mL, Rfl: 1   citalopram (CELEXA) 40 MG tablet, Take 1 tablet (40 mg total) by mouth daily., Disp: 90 tablet, Rfl: 1   Dulaglutide (TRULICITY) 0.75 MG/0.5ML SOPN, Inject 0.75 mg into the skin once a week. Via [employer] Patient Assistance, Disp: 6 mL, Rfl: 1   esomeprazole (NEXIUM) 40 MG capsule, Take 1 capsule (40 mg total) by mouth daily. Follow up in office for more refills, Disp: 90 capsule, Rfl: 1   fluticasone (FLONASE) 50 MCG/ACT nasal spray, PLACE 2 SPRAYS INTO BOTH NOSTRILS EVERY EVENING., Disp: 48 mL, Rfl: 1   fluticasone furoate-vilanterol (BREO  ELLIPTA) 100-25 MCG/INH AEPB, Inhale 1 puff into the lungs daily., Disp: , Rfl:    Galcanezumab-gnlm (EMGALITY) 120 MG/ML SOAJ, Inject 120 mg into the skin every 30 (thirty) days., Disp: 1.12 mL, Rfl: 3   levocetirizine (XYZAL) 5 MG tablet, Take 1 tablet (5 mg total) by mouth every evening., Disp: 30 tablet, Rfl: 2   losartan (COZAAR) 25 MG tablet, TAKE 1 TABLET EVERY DAY (Patient taking differently: Take 25 mg by mouth daily.), Disp: 90 tablet, Rfl: 3   methocarbamol (ROBAXIN) 500 MG tablet, Take 500 mg by mouth every 8 (eight) hours as needed for muscle spasms., Disp: , Rfl:    montelukast (SINGULAIR) 10 MG tablet, TAKE 1 TABLET BY MOUTH EVERYDAY AT BEDTIME, Disp: 90 tablet, Rfl: 3   Multiple Vitamins-Minerals (MULTIVITAMIN WITH MINERALS) tablet, Take 1 tablet by mouth daily., Disp: , Rfl:    Oxycodone HCl 10 MG TABS, , Disp: , Rfl:    propranolol (INDERAL) 10 MG tablet, TAKE 1 TABLET BY MOUTH THREE TIMES A DAY, Disp: 270 tablet, Rfl: 3   rosuvastatin (CRESTOR) 40 MG tablet, TAKE 1 TABLET BY MOUTH EVERYDAY AT BEDTIME, Disp: 90 tablet, Rfl: 1   sodium chloride (OCEAN) 0.65 % SOLN nasal spray, Place 1 spray into both nostrils 2 (two) times daily as needed for congestion., Disp: 60 mL, Rfl: 5   Blood Glucose Monitoring Suppl (TRUE METRIX AIR GLUCOSE METER) DEVI, True Metrix Glucose Meter (Patient not taking: Reported on 09/13/2021), Disp: , Rfl:    Continuous Blood Gluc Sensor (FREESTYLE LIBRE 3 SENSOR) MISC, 1 Device by Does not apply route every 14 (fourteen) days. (Patient not taking: Reported on 09/13/2021), Disp: 2 each, Rfl: 11   promethazine-dextromethorphan (PROMETHAZINE-DM) 6.25-15 MG/5ML syrup, Take 5 mLs by mouth 4 (four) times daily as needed for cough. (Patient not taking: Reported on 09/13/2021), Disp: 118 mL, Rfl: 0  Allergies  Allergen Reactions   Metformin And Related Diarrhea   Adhesive [Tape] Rash and Other (See Comments)    Regular tape is ok, allergy is to paper tape    I  personally reviewed active problem list, medication list, allergies, notes from last encounter with the patient/caregiver today.  ROS  Constitutional: Negative for fever or weight change.  Respiratory: Positive for cough and wheezing, negative for shortness of breath.   Cardiovascular: Negative for chest pain or palpitations.  Gastrointestinal: Negative for abdominal pain, no bowel changes.  Musculoskeletal: Negative for gait problem or joint swelling.  Skin: Negative for rash.  Neurological: Negative for dizziness or headache.  No other specific complaints in a complete review of systems (except as listed in HPI above).   Objective  Virtual encounter, vitals not obtained.  There is no height or weight on file to calculate BMI.  Nursing Note and Vital Signs reviewed.  Physical Exam  Awake, alert and oriented, speaking in complete sentences  No results found for this or any previous visit (from the past 72 hour(s)).  Assessment & Plan  There are no diagnoses linked to this encounter.  -Red flags and when to present for emergency care or RTC including fever >101.56F, chest pain, shortness of breath, new/worsening/un-resolving symptoms,  reviewed with patient at time of visit. Follow up and care instructions discussed and provided in AVS. - I discussed the assessment and treatment plan with the patient. The patient was provided an opportunity to ask questions and all were answered. The patient agreed with the plan and demonstrated an understanding of the instructions.  I provided 15 minutes of non-face-to-face time during this encounter.  [name], [name]

## 2021-09-13 NOTE — Telephone Encounter (Signed)
Have continuous wheezing and  cough can something be called in

## 2021-09-13 NOTE — Telephone Encounter (Signed)
Appointment today 11:20

## 2021-09-21 DIAGNOSIS — M1612 Unilateral primary osteoarthritis, left hip: Secondary | ICD-10-CM | POA: Diagnosis not present

## 2021-09-27 DIAGNOSIS — M1612 Unilateral primary osteoarthritis, left hip: Secondary | ICD-10-CM | POA: Diagnosis not present

## 2021-10-06 ENCOUNTER — Telehealth: Payer: Self-pay

## 2021-10-06 DIAGNOSIS — E0829 Diabetes mellitus due to underlying condition with other diabetic kidney complication: Secondary | ICD-10-CM

## 2021-10-06 MED ORDER — GLIPIZIDE ER 2.5 MG PO TB24: 2.5000 mg | ORAL_TABLET | Freq: Every day | ORAL | 1 refills | Status: DC

## 2021-10-06 NOTE — Progress Notes (Addendum)
Chronic Care Management Pharmacy Assistant   Name: Lori Potts  MRN: [id number] DOB: 1945-12-06  Reason for Encounter:Diabetes Disease State Call.  Recent office visits:  09/13/2021 Lori Goo [name] (PCP) start [facility] 00-62.5-25 MCG/ACT AEPB daily  Recent consult visits:  No recent consult visit  Hospital visits:  None in previous 6 months  Medications: Outpatient Encounter Medications as of 10/06/2021  Medication Sig   albuterol (VENTOLIN HFA) 108 (90 Base) MCG/ACT inhaler Inhale 1 puff into the lungs every 6 (six) hours as needed for wheezing or shortness of breath.   ascorbic acid (VITAMIN C) 500 MG tablet Take 500 mg by mouth daily.   aspirin 81 MG EC tablet Take 1 tablet by mouth daily.   azelastine (ASTELIN) 0.1 % nasal spray PLACE 1 SPRAY INTO BOTH NOSTRILS 2 (TWO) TIMES DAILY. USE IN EACH NOSTRIL AS DIRECTED   citalopram (CELEXA) 40 MG tablet Take 1 tablet (40 mg total) by mouth daily.   Dulaglutide (TRULICITY) 0.75 MG/0.5ML SOPN Inject 0.75 mg into the skin once a week. Via [employer] Patient Assistance   esomeprazole (NEXIUM) 40 MG capsule Take 1 capsule (40 mg total) by mouth daily. Follow up in office for more refills   fluticasone (FLONASE) 50 MCG/ACT nasal spray PLACE 2 SPRAYS INTO BOTH NOSTRILS EVERY EVENING.   Fluticasone-Umeclidin-Vilant (TRELEGY ELLIPTA) 100-62.5-25 MCG/ACT AEPB Inhale 1 puff into the lungs daily.   Galcanezumab-gnlm (EMGALITY) 120 MG/ML SOAJ Inject 120 mg into the skin every 30 (thirty) days.   levocetirizine (XYZAL) 5 MG tablet Take 1 tablet (5 mg total) by mouth every evening.   losartan (COZAAR) 25 MG tablet TAKE 1 TABLET EVERY DAY (Patient taking differently: Take 25 mg by mouth daily.)   methocarbamol (ROBAXIN) 500 MG tablet Take 500 mg by mouth every 8 (eight) hours as needed for muscle spasms.   montelukast (SINGULAIR) 10 MG tablet TAKE 1 TABLET BY MOUTH EVERYDAY AT BEDTIME   Multiple Vitamins-Minerals (MULTIVITAMIN WITH MINERALS)  tablet Take 1 tablet by mouth daily.   Oxycodone HCl 10 MG TABS    promethazine-dextromethorphan (PROMETHAZINE-DM) 6.25-15 MG/5ML syrup Take 5 mLs by mouth 4 (four) times daily as needed for cough. (Patient not taking: Reported on 09/13/2021)   propranolol (INDERAL) 10 MG tablet TAKE 1 TABLET BY MOUTH THREE TIMES A DAY   rosuvastatin (CRESTOR) 40 MG tablet TAKE 1 TABLET BY MOUTH EVERYDAY AT BEDTIME   sodium chloride (OCEAN) 0.65 % SOLN nasal spray Place 1 spray into both nostrils 2 (two) times daily as needed for congestion.   No facility-administered encounter medications on file as of 10/06/2021.    Care Gaps: Shingrix Vaccine Opthalmology exam    Star Rating Drugs: Rosuvastatin 40 mg last filled [facility]30[facility] Losartan 25 mg last filled 09/15/2020 [facility] Trulicity 0.75 mg last filled 11/30/2020 [facility] (receives from [employer]) Medication Fill Gaps: Citalopram 20 MG last filled 11/22/2020 90 day supply  Recent Relevant Labs: Lab Results  Component Value Date/Time   HGBA1C 6.9 (H) 07/08/2021 03:15 PM   HGBA1C 7.2 (A) 11/25/2020 08:04 AM   HGBA1C 7.2 (H) 11/14/2019 12:06 PM   HGBA1C 6.6 03/31/2019 12:00 AM   MICROALBUR 5.2 05/14/2019 12:00 AM   MICROALBUR 3.8 05/09/2018 09:50 AM   MICROALBUR 20 07/04/2016 10:36 AM    Kidney Function Lab Results  Component Value Date/Time   CREATININE 0.81 07/08/2021 03:15 PM   CREATININE 0.64 11/25/2020 08:39  AM   GFRNONAA >60 09/14/2020 09:37 AM   GFRNONAA 87 02/18/2020 09:37 AM   GFRAA 101 02/18/2020 09:37 AM    Current antihyperglycemic regimen:  Trulicity 0.75 mg weekly Tuesday What recent interventions/DTPs have been made to improve glycemic control:  Patient states she has not been taking Trulicity because she does not have any on hand.  Have there been any recent hospitalizations or ED visits since last visit with CPP? No Patient reports  hypoglycemic symptoms, including Sweaty Patient states she will break out in a swat out of no where, but unsure if it is related to her diabetes.  Patient denies hyperglycemic symptoms, including blurry vision, excessive thirst, fatigue, polyuria, and weakness  How often are you checking your blood sugar?  Patient states she is currently attending a trial for the [place] device.  What are your blood sugars ranging?  Patient states her blood sugar was 300 this morning, and been ranging in the high 200's.Patient states she has been out of her Trulicity for a few weeks now, and she reach out to PCP office to inform them.Patient states she has not had a return call yet from them.  During the week, how often does your blood glucose drop below 70? Never  Are you checking your feet daily/regularly?   Yes,patient denies any pain, numbness or tingling sensations in her feet.  Patient assistance for Trulicity: Patient states she went to her PCP office, and they inform they did not have any forms for her to pick up.Inform patient we could mail the application to her if she prefers, and I would reach out to her PCP office to see if they had any samples for Trulicity.Notified Clinical pharmacist.   Patient Verbalized understanding.  Per Clinical pharmacist:  Patient PCP office does not have any samples of Trulicity due to the backorders and the paperwork is at the front desk for her. She should sign the Trulicity application as soon as possible so we can try and get her approved. In the meantime I would recommend we have her take Glipizide XL 2.5 mg with breakfast to help control her blood sugar until she gets back on Trulicity.   Patient agreed to start Glipizide XL 2.5 mg, and [facility] [place].Patient states she will come and complete the patient assistance application  tomorrow at her PCP office because her husband may admitted to the hospital today.Patient also request if we  could mail it to her as well just incase the front desk tells her the same thing. Notified Clinical pharmacist.  Adherence Review: Is the patient currently on a STATIN medication? Yes Is the patient currently on ACE/ARB medication? Yes Does the patient have >5 day gap between last estimated fill dates? No   Telephone follow up appointment with Care management team member scheduled for : 11/24/2021 at 8:30 am.  [name] Clinical Pharmacist Assistant [phone]

## 2021-10-06 NOTE — Addendum Note (Signed)
Addended by: Julious Payer A on: 10/06/2021 01:17 PM   Modules accepted: Orders

## 2021-10-07 ENCOUNTER — Institutional Professional Consult (permissible substitution): Payer: Medicare HMO | Admitting: Pulmonary Disease

## 2021-10-19 ENCOUNTER — Telehealth: Payer: Self-pay | Admitting: Emergency Medicine

## 2021-10-19 NOTE — Telephone Encounter (Signed)
Was not able to get the Trulicity due to back order. Was changed to a pill 2 weeks ago. This is not working for her. Would rather have a shot. Can try Ozempic ?

## 2021-10-19 NOTE — Telephone Encounter (Signed)
Can you sent a script for a 1 month supply to pharmacy to see if her insurance will cover this

## 2021-10-19 NOTE — Telephone Encounter (Signed)
Will talk to [facility] about her Trulicity before calling in a new medication

## 2021-10-20 ENCOUNTER — Telehealth (INDEPENDENT_AMBULATORY_CARE_PROVIDER_SITE_OTHER): Payer: Medicare HMO | Admitting: Nurse Practitioner

## 2021-10-20 ENCOUNTER — Other Ambulatory Visit: Payer: Self-pay | Admitting: Family Medicine

## 2021-10-20 ENCOUNTER — Encounter: Payer: Self-pay | Admitting: Nurse Practitioner

## 2021-10-20 ENCOUNTER — Other Ambulatory Visit: Payer: Self-pay

## 2021-10-20 DIAGNOSIS — J069 Acute upper respiratory infection, unspecified: Secondary | ICD-10-CM

## 2021-10-20 DIAGNOSIS — F419 Anxiety disorder, unspecified: Secondary | ICD-10-CM

## 2021-10-20 DIAGNOSIS — F430 Acute stress reaction: Secondary | ICD-10-CM

## 2021-10-20 MED ORDER — PROMETHAZINE-DM 6.25-15 MG/5ML PO SYRP: 5.0000 mL | ORAL_SOLUTION | Freq: Four times a day (QID) | ORAL | 0 refills | Status: DC | PRN

## 2021-10-20 NOTE — Progress Notes (Signed)
? ?Name: Lori Potts   MRN: [id number]    DOB: July 05, 1946   Date:10/20/2021 ? ?     Progress Note ? ?Subjective ? ?Chief Complaint ? ?Chief Complaint  ?Patient presents with  ? Cough  ? Sore Throat  ? Ear Pain  ? ? ?I connected with  [name]  on 10/20/21 at  3:20 PM EST by a video enabled telemedicine application and verified that I am speaking with the correct person using two identifiers.  I discussed the limitations of evaluation and management by telemedicine and the availability of in person appointments. The patient expressed understanding and agreed to proceed with a virtual visit  Staff also discussed with the patient that there may be a patient responsible charge related to this service. ?Patient Location: home ?Provider Location: [facility] ?Additional Individuals present: alone ? ?HPI ? ?URI: She says her symptoms started yesterday. She says she  sore throat, ear pain, cough and nasal congestion. She denies any fever or shortness of breath.  She says she took some of her left over cough medicine she had the last time she was sick and it helped and she would like a refill.  Refill sent.  She says she has been taking theraflu as well. Discussed about coming by and getting tested for covid and the flu.  She declined she says she already tested for covid and it was negative. She says she is taking her allergy medication and her flonase. Discussed adding mucinex, pushing fluids and getting rest. She verbalized understanding.   ? ?Patient Active Problem List  ? Diagnosis Date Noted  ? Neutropenia, unspecified type (HCC) 11/25/2020  ? Intractable migraine with aura without status migrainosus 04/18/2020  ? Body mass index (BMI) 31.0-31.9, adult 02/06/2020  ? Chronic left hip pain 12/26/2019  ? Anemia 09/22/2017  ? S/P knee replacement 07/28/2017  ? Allergic rhinitis 05/26/2017  ? Vitamin D deficiency 05/19/2017  ? Chronic nonintractable headache 05/18/2017  ? Advanced care planning/counseling discussion   ?  Gastroesophageal reflux disease   ? 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 Insomnia 11/29/2016  ? Chronic right-sided low back pain with right-sided sciatica 04/19/2016  ? Abdominal wall pain in right flank 03/29/2016  ? Diverticulosis 02/24/2016  ? Hypercholesterolemia 06/16/2015  ? Chronic pain 04/13/2015  ? Neck pain 02/26/2015  ? Status post lumbar surgery 02/26/2015  ? Abnormal ECG 01/27/2015  ? Angina pectoris (HCC) 01/27/2015  ? 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 Essential (primary) hypertension 01/27/2015  ? ? ?Social History  ? ?Tobacco Use  ? Smoking status: Former  ?  Types: Cigarettes  ?  Quit date: 2006  ?  Years since quitting: 17.1  ? Smokeless tobacco: Never  ? Tobacco comments:  ?  quit smoking 81yrs ago  ?Substance Use Topics  ? Alcohol use: No  ? ? ? ?Current Outpatient Medications:  ?  albuterol (VENTOLIN HFA) 108 (90 Base) MCG/ACT inhaler, Inhale 1 puff into the lungs every 6 (six) hours as needed for wheezing or shortness of breath., Disp: 18 g, Rfl: 2 ?  ascorbic acid (VITAMIN C) 500 MG tablet, Take 500 mg by mouth daily., Disp: , Rfl:  ?  aspirin 81 MG EC tablet, Take 1 tablet by mouth daily., Disp: , Rfl:  ?  azelastine (ASTELIN) 0.1 % nasal spray, PLACE 1 SPRAY INTO BOTH NOSTRILS 2 (TWO) TIMES DAILY. USE IN EACH NOSTRIL AS DIRECTED, Disp: 90 mL, Rfl: 1 ?  citalopram (CELEXA) 40 MG tablet, Take 1 tablet (40 mg total) by mouth daily., Disp: 90 tablet, Rfl: 1 ?  esomeprazole (NEXIUM) 40 MG capsule, Take 1 capsule (40 mg total) by mouth daily. Follow up in office for more refills, Disp: 90 capsule, Rfl: 1 ?  fluticasone (FLONASE) 50 MCG/ACT nasal spray, PLACE 2 SPRAYS INTO BOTH NOSTRILS EVERY EVENING., Disp: 48 mL, Rfl: 1 ?  Fluticasone-Umeclidin-Vilant (TRELEGY ELLIPTA) 100-62.5-25 MCG/ACT AEPB, Inhale 1 puff into the lungs daily., Disp: 1 each, Rfl: 11 ?  Galcanezumab-gnlm (EMGALITY) 120 MG/ML SOAJ, Inject 120 mg into the skin every 30  (thirty) days., Disp: 1.12 mL, Rfl: 3 ?  glipiZIDE (GLIPIZIDE XL) 2.5 MG 24 hr tablet, Take 1 tablet (2.5 mg total) by mouth daily with breakfast., Disp: 30 tablet, Rfl: 1 ?  levocetirizine (XYZAL) 5 MG tablet, Take 1 tablet (5 mg total) by mouth every evening., Disp: 30 tablet, Rfl: 2 ?  losartan (COZAAR) 25 MG tablet, TAKE 1 TABLET EVERY DAY (Patient taking differently: Take 25 mg by mouth daily.), Disp: 90 tablet, Rfl: 3 ?  methocarbamol (ROBAXIN) 500 MG tablet, Take 500 mg by mouth every 8 (eight) hours as needed for muscle spasms., Disp: , Rfl:  ?  montelukast (SINGULAIR) 10 MG tablet, TAKE 1 TABLET BY MOUTH EVERYDAY AT BEDTIME, Disp: 90 tablet, Rfl: 3 ?  Multiple Vitamins-Minerals (MULTIVITAMIN WITH MINERALS) tablet, Take 1 tablet by mouth daily., Disp: , Rfl:  ?  Oxycodone HCl 10 MG TABS, , Disp: , Rfl:  ?  propranolol (INDERAL) 10 MG tablet, TAKE 1 TABLET BY MOUTH THREE TIMES A DAY, Disp: 270 tablet, Rfl: 3 ?  rosuvastatin (CRESTOR) 40 MG tablet, TAKE 1 TABLET BY MOUTH EVERYDAY AT BEDTIME, Disp: 90 tablet, Rfl: 1 ?  sodium chloride (OCEAN) 0.65 % SOLN nasal spray, Place 1 spray into both nostrils 2 (two) times daily as needed for congestion., Disp: 60 mL, Rfl: 5 ?  Dulaglutide (TRULICITY) 0.75 MG/0.5ML SOPN, Inject 0.75 mg into the skin once a week. Via [employer] Patient Assistance (Patient not taking: Reported on 10/06/2021), Disp: 6 mL, Rfl: 1 ?  promethazine-dextromethorphan (PROMETHAZINE-DM) 6.25-15 MG/5ML syrup, Take 5 mLs by mouth 4 (four) times daily as needed for cough. (Patient not taking: Reported on 09/13/2021), Disp: 118 mL, Rfl: 0 ? ?Allergies  ?Allergen Reactions  ? Metformin And Related Diarrhea  ? Adhesive [Tape] Rash and Other (See Comments)  ?  Regular tape is ok, allergy is to paper tape  ? ? ?I personally reviewed active problem list, medication list, allergies, notes from last encounter with the patient/caregiver today. ? ?ROS ? ?Constitutional: Negative for fever or weight change.   ?HEENT: positive for sore throat, nasal congestion and ear pain ?Respiratory: positive for cough and negative for shortness of breath.   ?Cardiovascular: Negative for chest pain or palpitations.  ?Gastrointestinal: Negative for abdominal pain, no bowel changes.  ?Musculoskeletal: Negative for gait problem or joint swelling.  ?Skin: Negative for rash.  ?Neurological: Negative for dizziness or headache.  ?No other specific complaints in a complete review of systems (except as listed in HPI above).  ? ?Objective ? ?Virtual encounter, vitals not obtained. ? ?There is no height or weight on file to calculate BMI. ? ?Nursing Note and Vital Signs reviewed. ? ?Physical Exam ? ?Awake, alert and oriented, speaking in complete sentences.  ? ?No results found for this or any previous visit (from the past 72 hour(s)). ? ?Assessment & Plan ? ?1. Viral upper respiratory tract infection ?-  mucinex, flonase, allergy medicaiton ?-push fluids, get rest ?- promethazine-dextromethorphan (PROMETHAZINE-DM) 6.25-15 MG/5ML syrup; Take 5 mLs by mouth 4 (four) times daily as needed for cough.  Dispense: 118 mL; Refill: 0  ? ?-Red flags and when to present for emergency care or RTC including fever >101.47F, chest pain, shortness of breath, new/worsening/un-resolving symptoms,  reviewed with patient at time of visit. Follow up and care instructions discussed and provided in AVS. ?- I discussed the assessment and treatment plan with the patient. The patient was provided an opportunity to ask questions and all were answered. The patient agreed with the plan and demonstrated an understanding of the instructions. ? ?I provided 15 minutes of non-face-to-face time during this encounter. ? ?[name], [name] ? ?  ?

## 2021-10-20 NOTE — Telephone Encounter (Signed)
Spoke to Lori Potts and the patient. Patient will continue what she is taking for next 2 weeks until she see if [name] can get her patient assistant for Trulicity approved. If not she will then like to be switched to Ozempic ?

## 2021-10-27 ENCOUNTER — Ambulatory Visit: Payer: Self-pay

## 2021-10-27 ENCOUNTER — Other Ambulatory Visit: Payer: Self-pay | Admitting: Nurse Practitioner

## 2021-10-27 DIAGNOSIS — E0829 Diabetes mellitus due to underlying condition with other diabetic kidney complication: Secondary | ICD-10-CM

## 2021-10-27 MED ORDER — OZEMPIC (0.25 OR 0.5 MG/DOSE) 2 MG/1.5ML ~~LOC~~ SOPN: 0.2500 mg | PEN_INJECTOR | SUBCUTANEOUS | 2 refills | Status: DC

## 2021-10-27 NOTE — Telephone Encounter (Signed)
The patient would like to speak with a member of staff when possible  ? ?The patient shares that their blood sugar is more than 300 and has been for the past 4 days  ? ?Please contact further when possible  ? ? ? ?Chief Complaint: High blood sugars. Asking to try Ozempic. ?Symptoms: No ?Frequency: Started 4 days ago ?Pertinent Negatives: Patient denies  ?Disposition: [] ED /[] Urgent Care (no appt availability in office) / [] Appointment(In office/virtual)/ []  [facility]/ [] Home Care/ [] Refused Recommended Disposition /[] [facility]/ []  Follow-up with PCP ?Additional Notes: Pt. States she would like to "try going on Ozempic." Please advise.  ?Answer Assessment - Initial Assessment Questions ?1. BLOOD GLUCOSE: "What is your blood glucose level?"  ?    300 ?2. ONSET: "When did you check the blood glucose?" ?    Today   average 208  ?3. USUAL RANGE: "What is your glucose level usually?" (e.g., usual fasting morning value, usual evening value) ?    120 ?4. KETONES: "Do you check for ketones (urine or blood test strips)?" If yes, ask: "What does the test show now?"  ?    No ?5. TYPE 1 or 2:  "Do you know what type of diabetes you have?"  (e.g., Type 1, Type 2, Gestational; doesn't know)  ?    Type 2 ?6. INSULIN: "Do you take insulin?" "What type of insulin(s) do you use? What is the mode of delivery? (syringe, pen; injection or pump)?"  ?    No ?7. DIABETES PILLS: "Do you take any pills for your diabetes?" If yes, ask: "Have you missed taking any pills recently?" ?    No ?8. OTHER SYMPTOMS: "Do you have any symptoms?" (e.g., fever, frequent urination, difficulty breathing, dizziness, weakness, vomiting) ?    No ?9. PREGNANCY: "Is there any chance you are pregnant?" "When was your last menstrual period?" ?    No ? ?Protocols used: Diabetes - High Blood Sugar-A-AH ? ?

## 2021-10-28 ENCOUNTER — Other Ambulatory Visit: Payer: Self-pay | Admitting: Nurse Practitioner

## 2021-10-28 ENCOUNTER — Other Ambulatory Visit: Payer: Self-pay | Admitting: Family Medicine

## 2021-10-28 DIAGNOSIS — J4521 Mild intermittent asthma with (acute) exacerbation: Secondary | ICD-10-CM

## 2021-10-28 DIAGNOSIS — E7800 Pure hypercholesterolemia, unspecified: Secondary | ICD-10-CM

## 2021-10-28 NOTE — Telephone Encounter (Signed)
Requested Prescriptions  ?Pending Prescriptions Disp Refills  ?? rosuvastatin (CRESTOR) 40 MG tablet [Pharmacy Med Name: ROSUVASTATIN CALCIUM 40 MG TAB] 90 tablet 1  ?  Sig: TAKE 1 TABLET BY MOUTH EVERYDAY AT BEDTIME  ?  ? Cardiovascular:  Antilipid - Statins 2 Failed - 10/28/2021  3:43 PM  ?  ?  Failed - Lipid Panel in normal range within the last 12 months  ?  Cholesterol  ?Date Value Ref Range Status  ?07/08/2021 163 <200 mg/dL Final  ? ?LDL Cholesterol (Calc)  ?Date Value Ref Range Status  ?07/08/2021 90 mg/dL (calc) Final  ?  Comment:  ?  Reference range: <100 ?Marland Kitchen ?Desirable range <100 mg/dL for primary prevention;   ?<70 mg/dL for patients with CHD or diabetic patients  ?with > or = 2 CHD risk factors. ?. ?LDL-C is now calculated using the Martin-Hopkins  ?calculation, which is a validated novel method providing  ?better accuracy than the Friedewald equation in the  ?estimation of LDL-C.  ?Horald Pollen et al. Lenox Ahr. 7829;562(13): 2061-2068  ?(http://education.[employer].com/faq/FAQ164) ?  ? ?HDL  ?Date Value Ref Range Status  ?07/08/2021 53 > OR = 50 mg/dL Final  ? ?Triglycerides  ?Date Value Ref Range Status  ?07/08/2021 102 <150 mg/dL Final  ? ?  ?  ?  Passed - Cr in normal range and within 360 days  ?  Creat  ?Date Value Ref Range Status  ?07/08/2021 0.81 0.60 - 1.00 mg/dL Final  ? ?Creatinine, Urine  ?Date Value Ref Range Status  ?05/09/2018 81 20 - 275 mg/dL Final  ?   ?  ?  Passed - Patient is not pregnant  ?  ?  Passed - Valid encounter within last 12 months  ?  Recent Outpatient Visits   ?      ? 1 week ago Viral upper respiratory tract infection  ? [facility] [name], [name]  ? 1 month ago Moderate persistent asthma, unspecified whether complicated  ? [facility] [name], [name]  ? 2 months ago Viral upper respiratory tract infection  ? [facility] [name], [name]  ? 2 months ago Anxiety  ? [facility]  [name], [name]  ? 3 months ago Viral upper respiratory tract infection  ? [facility] [name], [name]  ?  ?  ?Future Appointments   ?        ? In 1 week [facility]   ?  ? ?  ?  ?  ? ? ?

## 2021-10-30 ENCOUNTER — Other Ambulatory Visit: Payer: Self-pay | Admitting: Family Medicine

## 2021-10-30 DIAGNOSIS — R809 Proteinuria, unspecified: Secondary | ICD-10-CM

## 2021-10-30 DIAGNOSIS — E0829 Diabetes mellitus due to underlying condition with other diabetic kidney complication: Secondary | ICD-10-CM

## 2021-11-08 ENCOUNTER — Institutional Professional Consult (permissible substitution): Payer: Medicare HMO | Admitting: Adult Health

## 2021-11-09 ENCOUNTER — Telehealth: Payer: Self-pay

## 2021-11-09 ENCOUNTER — Ambulatory Visit: Payer: Medicare HMO

## 2021-11-09 NOTE — Progress Notes (Addendum)
? ? ?Chronic Care Management ?Pharmacy Assistant  ? ?Name: Lori Potts  MRN: [id number] DOB: 1945/12/14 ? ?Reason for Encounter: Medication Review/Patient assistance update for Trulicity. ?  ?Recent office visits:  ?10/27/2021 Lori Goo [name] (PCP) start Ozempic 2 mg/1.5 ML Inject 0.25 mg weekly ?10/20/2021 Lori Goo [name] (PCP) start promethazine-DM 6.25-15 MG/5ML PRN ? ?Recent consult visits:  ?No recent consult visit ? ?Hospital visits:  ?None in previous 6 months ? ?Medications: ?Outpatient Encounter Medications as of 11/09/2021  ?Medication Sig  ? albuterol (VENTOLIN HFA) 108 (90 Base) MCG/ACT inhaler Inhale 1 puff into the lungs every 6 (six) hours as needed for wheezing or shortness of breath.  ? ascorbic acid (VITAMIN C) 500 MG tablet Take 500 mg by mouth daily.  ? aspirin 81 MG EC tablet Take 1 tablet by mouth daily.  ? azelastine (ASTELIN) 0.1 % nasal spray PLACE 1 SPRAY INTO BOTH NOSTRILS 2 (TWO) TIMES DAILY. USE IN EACH NOSTRIL AS DIRECTED  ? citalopram (CELEXA) 40 MG tablet Take 1 tablet (40 mg total) by mouth daily.  ? esomeprazole (NEXIUM) 40 MG capsule Take 1 capsule (40 mg total) by mouth daily. Follow up in office for more refills  ? fluticasone (FLONASE) 50 MCG/ACT nasal spray PLACE 2 SPRAYS INTO BOTH NOSTRILS EVERY EVENING.  ? Fluticasone-Umeclidin-Vilant (TRELEGY ELLIPTA) 100-62.5-25 MCG/ACT AEPB Inhale 1 puff into the lungs daily.  ? Galcanezumab-gnlm (EMGALITY) 120 MG/ML SOAJ Inject 120 mg into the skin every 30 (thirty) days.  ? glipiZIDE (GLUCOTROL XL) 2.5 MG 24 hr tablet TAKE 1 TABLET BY MOUTH DAILY WITH BREAKFAST.  ? levocetirizine (XYZAL) 5 MG tablet Take 1 tablet (5 mg total) by mouth every evening.  ? losartan (COZAAR) 25 MG tablet TAKE 1 TABLET EVERY DAY (Patient taking differently: Take 25 mg by mouth daily.)  ? methocarbamol (ROBAXIN) 500 MG tablet Take 500 mg by mouth every 8 (eight) hours as needed for muscle spasms.  ? montelukast (SINGULAIR) 10 MG tablet TAKE 1 TABLET BY  MOUTH EVERYDAY AT BEDTIME  ? Multiple Vitamins-Minerals (MULTIVITAMIN WITH MINERALS) tablet Take 1 tablet by mouth daily.  ? Oxycodone HCl 10 MG TABS   ? promethazine-dextromethorphan (PROMETHAZINE-DM) 6.25-15 MG/5ML syrup Take 5 mLs by mouth 4 (four) times daily as needed for cough.  ? propranolol (INDERAL) 10 MG tablet TAKE 1 TABLET BY MOUTH THREE TIMES A DAY  ? rosuvastatin (CRESTOR) 40 MG tablet TAKE 1 TABLET BY MOUTH EVERYDAY AT BEDTIME  ? Semaglutide,0.25 or 0.5MG /DOS, (OZEMPIC, 0.25 OR 0.5 MG/DOSE,) 2 MG/1.5ML SOPN Inject 0.25 mg into the skin once a week.  ? sodium chloride (OCEAN) 0.65 % SOLN nasal spray Place 1 spray into both nostrils 2 (two) times daily as needed for congestion.  ? ?No facility-administered encounter medications on file as of 11/09/2021.  ? ? ?Care Gaps: ?Shingrix Vaccine ?Opthalmology exam  ?Mammogram ?  ?Star Rating Drugs: ?Rosuvastatin 40 mg last filled 09/25/2021 [facility] ?Losartan 25 mg last filled 09/15/2020 for [facility] day [facility] ?Trulicity 0.75 mg last filled 11/30/2020 for [facility] day supply at [facility] (receives from [employer]) ? ?Medication Fill Gaps: ?Citalopram 20 MG last filled 11/22/2020 90 day supply ? ?Patient assistance for Trulicity: ? ?I reach out to [employer] to get a update on Trulicity.Per [facility], they are missing information regarding if the patient has Medicare A,B or D.Inform [employer] [facility] she does have Medicare Part A B, and D. Per [employer], they will process her applications, and put  urgent on the application for a quicker result since the patient is out of Trulicity. [employer] inform me they do not have any Vouchers, and to contact them On Friday if the patient has not had any response from them. ? ?Patient verbalized understanding, and she is aware to return my call if [employer] [facility] inform her if she is approved or denied.  ? ?11/15/2021: ?I reach out to [employer] to get a update on Trulicity patient assistance  application.Per [employer], patient application is still in process, and she is not missing information at this time.I will reach out to [employer] [facility] on Friday 11/19/2021 to check again. ? ?11/19/2021: ?I reach out to [employer] to get a update on Trulicity patient assistance application.Per [employer], patient was approved to receive patient assistance through 07/2022. Patient will need to re enroll by 08/21/2022. ?Per [employer],  patient should receive her shipment by next Friday 11/26/2021 or before. ? ?Patient is asking if she continues the Ozempic and Glipizide once she receives Trulicity.Notified Clinical pharmacist. ? ?Per Clinical pharmacist, She can finish current supply of Ozempic, stop Glipizide, and then start Trulicity.Patient has appointment with me on 11/24/2021 and we can discuss this as well. Patient states she is interest in seeing if she could get the freestyle libre deceive, but will discuss this at her appointment.  ? ?[name] ?Clinical Pharmacist Assistant ?[phone]  ? ?

## 2021-11-11 ENCOUNTER — Ambulatory Visit
Admission: RE | Admit: 2021-11-11 | Discharge: 2021-11-11 | Disposition: A | Discharge status: Home / Self Care | Payer: Medicare HMO | Attending: Pulmonary Disease | Admitting: Pulmonary Disease

## 2021-11-11 ENCOUNTER — Ambulatory Visit (INDEPENDENT_AMBULATORY_CARE_PROVIDER_SITE_OTHER): Payer: Medicare HMO | Admitting: Pulmonary Disease

## 2021-11-11 ENCOUNTER — Ambulatory Visit
Admission: RE | Admit: 2021-11-11 | Discharge: 2021-11-11 | Disposition: A | Discharge status: Home / Self Care | Payer: Medicare HMO | Source: Ambulatory Visit | Attending: Pulmonary Disease | Admitting: Pulmonary Disease

## 2021-11-11 ENCOUNTER — Encounter: Payer: Self-pay | Admitting: Pulmonary Disease

## 2021-11-11 ENCOUNTER — Other Ambulatory Visit: Payer: Self-pay

## 2021-11-11 VITALS — BP 126/80 | HR 65 | Temp 97.7°F | Ht 63.5 in | Wt 186.8 lb

## 2021-11-11 DIAGNOSIS — R059 Cough, unspecified: Secondary | ICD-10-CM | POA: Diagnosis not present

## 2021-11-11 DIAGNOSIS — R0602 Shortness of breath: Secondary | ICD-10-CM | POA: Insufficient documentation

## 2021-11-11 DIAGNOSIS — J45909 Unspecified asthma, uncomplicated: Secondary | ICD-10-CM

## 2021-11-11 MED ORDER — ALBUTEROL SULFATE HFA 108 (90 BASE) MCG/ACT IN AERS: 2.0000 | INHALATION_SPRAY | Freq: Four times a day (QID) | RESPIRATORY_TRACT | 2 refills | Status: DC | PRN

## 2021-11-11 NOTE — Patient Instructions (Signed)
I have refilled your emergency inhaler (albuterol) so you can use it whenever you feel short of breath. ? ?You may stop taking the Trelegy since it is not helping you. ? ?We have ordered breathing tests, heart test and a chest x-ray to evaluate your shortness of breath. ? ?We will see you in follow-up in 6 to 8 weeks time call sooner should any new problems arise. ?

## 2021-11-11 NOTE — Progress Notes (Signed)
Subjective:    Patient ID: Lori Potts, female    DOB: 1946/01/05, 76 y.o.   MRN: [id number]  Patient Care Team: Bo Merino, [name] as PCP - General (Nurse Practitioner) Germaine Pomfret, [facility] (Pharmacist) Thornton Park, MD as Referring Physician (Orthopedic Surgery) Levy Pupa, PA-C as Physician Assistant (Pain Medicine)  Chief Complaint  Patient presents with   Consult   HPI Patient is a 76 year old remote former smoker with minimal tobacco exposure, who presents for evaluation of shortness of breath.  She is kindly referred by Serafina Royals, NP.  She also has been having occasional cough and "wheezing".  Patient states that she was told she had COVID-19 approximately a year ago (I cannot find confirmation of this) and that she started noticing shortness of breath after that.  However, on review of her records it appears that she has had a complaint of dyspnea of longstanding, since at least 2017, and has had cardiac work-up performed several times since then.  Patient had a left heart cath in coronary angiography in 2017 and 2020 for the same complaint.  History had to be discerned from the patient's chart as she kept stating she had no cardiac problems.  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  EF was noted to be 60% and by LV gram she had normal wall motion.  She was evaluated by Dr. Ashby Dawes in 2017 for pleuritic chest pain and only seen 1 time.  She states that she has a history of asthma as a child.  She has been on Trelegy recently but does not feel that this helps her shortness of breath any and has stopped using the medication.  She occasionally uses albuterol as needed and this seems to help her more.  Of note her albuterol inhaler expired in 2021.  She has had a dry cough on occasion.  No hemoptysis.  No sputum production.  She notes dyspnea mostly when walking particularly if on an  incline.  Shortness of breath goes away by sitting down and resting.  Does not have problems with other activities of daily living.  Last chest x-ray was in 2019.  No acute issues noted on that imaging.  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   Review of Systems A 10 point review of systems was performed and it is as noted above otherwise negative.  Past Medical History:  Diagnosis Date   Anxiety    Arthritis    Asthma    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[place]) 03/02/2017   Cataract    right eye but immature   Essential hypertension, benign    takes Lisinopril-HCTZ daily   GERD (gastroesophageal reflux disease)    Headache    sinus   History of colon polyps    benign   History of migraine    History of shingles    Hyperlipidemia    takes Lipitor daily   Low back pain    Nocturia    S/P insertion of spinal cord stimulator    Seasonal allergies    takes Singulair daily as needed   Type II or unspecified type diabetes mellitus without mention of complication, not stated as uncontrolled    Vitamin D deficiency    takes Vit D weekly   Weakness    numbness and tingling left arm   Wears dentures  full upper and lower   Past Surgical History:  Procedure Laterality Date   ABDOMINAL HYSTERECTOMY     ANTERIOR CERVICAL DECOMP/DISCECTOMY FUSION N/A 02/26/2015   Procedure: ANTERIOR CERVICAL DISCECTOMY FUSION C4-5 (1 LEVEL);  Surgeon: Melina Schools, MD;  Location: [place];  Service: Orthopedics;  Laterality: N/A;   ANTERIOR LAT LUMBAR FUSION N/A 03/21/2013   Procedure: ANTERIOR LATERAL LUMBAR FUSION 1 LEVEL/ XLIF L3-L4 ;  Surgeon: Melina Schools, MD;  Location: [place];  Service: Orthopedics;  Laterality: N/A;   APPENDECTOMY     AUGMENTATION MAMMAPLASTY Bilateral 1978   BACK SURGERY     BACK SURGERY     Lumbar fusion x 2   BREAST EXCISIONAL BIOPSY Right 1970   BREAST SURGERY  1990   Augementation   CARDIAC CATHETERIZATION  5/12   ef 55%    CARDIAC CATHETERIZATION  10/2010   [facility]; EF 55%   CARDIAC CATHETERIZATION Left 02/16/2016   Procedure: Left Heart Cath and Coronary Angiography;  Surgeon: Yolonda Kida, MD;  Location: [place] [facility];  Service: Cardiovascular;  Laterality: Left;   COLONOSCOPY     COLONOSCOPY WITH PROPOFOL N/A 04/10/2017   Procedure: COLONOSCOPY WITH PROPOFOL;  Surgeon: Lucilla Lame, MD;  Location: [place];  Service: Gastroenterology;  Laterality: N/A;  Diabetic - insulin   ESOPHAGOGASTRODUODENOSCOPY     JOINT REPLACEMENT     KIDNEY SURGERY  1998   growth removed from left kidney    LEFT HEART CATH AND CORONARY ANGIOGRAPHY Left 09/10/2018   Procedure: LEFT HEART CATH AND CORONARY ANGIOGRAPHY;  Surgeon: Yolonda Kida, MD;  Location: [place] [facility];  Service: Cardiovascular;  Laterality: Left;   pain stimulator     POSTERIOR CERVICAL FUSION/FORAMINOTOMY Right 03/21/2013   Procedure: POSTERIOR L2-3 RIGHT FORAMINOTOMY;  Surgeon: Melina Schools, MD;  Location: [place];  Service: Orthopedics;  Laterality: Right;   REVISION OF SCAR TISSUE RECTUS MUSCLE     SHOULDER ARTHROSCOPY WITH ROTATOR CUFF REPAIR AND SUBACROMIAL DECOMPRESSION Right 09/17/2020   Procedure: RIGHT SHOULDER ARTHROSCOPY WITH MINI-OPEN ROTATOR CUFF REPAIR, DISTAL CLAVICLE EXCISION, AND SUBACROMIAL DECOMPRESSION WITH BICEP TENDONESIS;  Surgeon: Thornton Park, MD;  Location: [facility];  Service: Orthopedics;  Laterality: Right;   SMALL BOWEL REPAIR     SPINAL CORD STIMULATOR BATTERY EXCHANGE N/A 10/17/2012   Procedure: SPINAL CORD STIMULATOR BATTERY REMOVAL;  Surgeon: Melina Schools, MD;  Location: [place];  Service: Orthopedics;  Laterality: N/A;   SPINAL CORD STIMULATOR BATTERY EXCHANGE N/A 07/22/2015   Procedure: REIMPLANTATION OF SPINAL CORD STIMULATOR BATTERY ;  Surgeon: Melina Schools, MD;  Location: [place];  Service: Orthopedics;  Laterality: N/A;   TOTAL KNEE ARTHROPLASTY Left    TOTAL KNEE ARTHROPLASTY Right 07/28/2017    Procedure: RIGHT TOTAL KNEE ARTHROPLASTY;  Surgeon: Sydnee Cabal, MD;  Location: [facility];  Service: Orthopedics;  Laterality: Right;   Patient Active Problem List   Diagnosis Date Noted   Neutropenia, unspecified type ([place]) 11/25/2020   Intractable migraine with aura without status migrainosus 04/18/2020   Body mass index (BMI) 31.0-31.9, adult 02/06/2020   Chronic left hip pain 12/26/2019   Anemia 09/22/2017   S/P knee replacement 07/28/2017   Allergic rhinitis 05/26/2017   Vitamin D deficiency 05/19/2017   Chronic nonintractable headache 05/18/2017   Advanced care planning/counseling discussion    Gastroesophageal reflux disease    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[place]) 03/02/2017   Insomnia 11/29/2016   Chronic right-sided low back pain with right-sided sciatica 04/19/2016   Abdominal wall  pain in right flank 03/29/2016   Diverticulosis 02/24/2016   Hypercholesterolemia 06/16/2015   Chronic pain 04/13/2015   Neck pain 02/26/2015   Status post lumbar surgery 02/26/2015   Abnormal ECG 01/27/2015   Angina pectoris ([place]) 01/27/2015   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[place]) 01/27/2015   Essential (primary) hypertension 01/27/2015   Family History  Problem Relation Age of Onset   Heart attack Mother    Sarcoidosis Sister    Social History   Tobacco Use   Smoking status: Former    Types: Cigarettes    Quit date: 2006    Years since quitting: 17.2   Smokeless tobacco: Never   Tobacco comments:    quit smoking 68yrs ago  Substance Use Topics   Alcohol use: No   Allergies  Allergen Reactions   Metformin And Related Diarrhea   Adhesive [Tape] Rash and Other (See Comments)    Regular tape is ok, allergy is to paper tape   Current Meds  Medication Sig   albuterol (VENTOLIN HFA) 108 (90 Base) MCG/ACT inhaler Inhale 1 puff into the lungs every 6 (six) hours as needed for wheezing or shortness of breath.   ascorbic acid (VITAMIN C) 500 MG tablet  Take 500 mg by mouth daily.   aspirin 81 MG EC tablet Take 1 tablet by mouth daily.   azelastine (ASTELIN) 0.1 % nasal spray PLACE 1 SPRAY INTO BOTH NOSTRILS 2 (TWO) TIMES DAILY. USE IN EACH NOSTRIL AS DIRECTED   citalopram (CELEXA) 40 MG tablet Take 1 tablet (40 mg total) by mouth daily.   esomeprazole (NEXIUM) 40 MG capsule Take 1 capsule (40 mg total) by mouth daily. Follow up in office for more refills   fluticasone (FLONASE) 50 MCG/ACT nasal spray PLACE 2 SPRAYS INTO BOTH NOSTRILS EVERY EVENING.   Fluticasone-Umeclidin-Vilant (TRELEGY ELLIPTA) 100-62.5-25 MCG/ACT AEPB Inhale 1 puff into the lungs daily.   Galcanezumab-gnlm (EMGALITY) 120 MG/ML SOAJ Inject 120 mg into the skin every 30 (thirty) days.   glipiZIDE (GLUCOTROL XL) 2.5 MG 24 hr tablet TAKE 1 TABLET BY MOUTH DAILY WITH BREAKFAST.   levocetirizine (XYZAL) 5 MG tablet Take 1 tablet (5 mg total) by mouth every evening.   losartan (COZAAR) 25 MG tablet TAKE 1 TABLET EVERY DAY (Patient taking differently: Take 25 mg by mouth daily.)   methocarbamol (ROBAXIN) 500 MG tablet Take 500 mg by mouth every 8 (eight) hours as needed for muscle spasms.   montelukast (SINGULAIR) 10 MG tablet TAKE 1 TABLET BY MOUTH EVERYDAY AT BEDTIME   Multiple Vitamins-Minerals (MULTIVITAMIN WITH MINERALS) tablet Take 1 tablet by mouth daily.   Oxycodone HCl 10 MG TABS    promethazine-dextromethorphan (PROMETHAZINE-DM) 6.25-15 MG/5ML syrup Take 5 mLs by mouth 4 (four) times daily as needed for cough.   propranolol (INDERAL) 10 MG tablet TAKE 1 TABLET BY MOUTH THREE TIMES A DAY   rosuvastatin (CRESTOR) 40 MG tablet TAKE 1 TABLET BY MOUTH EVERYDAY AT BEDTIME   Semaglutide,0.25 or 0.5MG /DOS, (OZEMPIC, 0.25 OR 0.5 MG/DOSE,) 2 MG/1.5ML SOPN Inject 0.25 mg into the skin once a week.   sodium chloride (OCEAN) 0.65 % SOLN nasal spray Place 1 spray into both nostrils 2 (two) times daily as needed for congestion.   Immunization History  Administered Date(s) Administered    Fluad Quad(high Dose 65+) 05/08/2019, 06/07/2021   Influenza, High Dose Seasonal PF 06/10/2015, 06/20/2016, 05/09/2017, 05/09/2018, 04/17/2020   PFIZER(Purple Top)SARS-COV-2 Vaccination 09/11/2019, 10/02/2019, 06/08/2020, 12/29/2020   PNEUMOCOCCAL CONJUGATE-20 06/07/2021  Pension scheme manager 63yrs & up 07/14/2021   Pneumococcal Conjugate-13 11/28/2013   Pneumococcal Polysaccharide-23 03/16/2018   Tdap 05/08/2019        Objective:   Physical Exam BP 126/80 (BP Location: Left Arm, Patient Position: Sitting, Cuff Size: Normal)   Pulse 65   Temp 97.7 F (36.5 C) (Oral)   Ht 5' 3.5" (1.613 m)   Wt 186 lb 12.8 oz (84.7 kg)   SpO2 97%   BMI 32.57 kg/m  GENERAL: Overweight woman, no acute distress, fully ambulatory.  No conversational dyspnea. HEAD: Normocephalic, atraumatic.  EYES: Pupils equal, round, reactive to light.  No scleral icterus.  MOUTH: Nose/mouth/throat not examined due to masking requirements for COVID 19. NECK: Supple. No thyromegaly. Trachea midline. No JVD.  No adenopathy. PULMONARY: Good air entry bilaterally.  No adventitious sounds. CARDIOVASCULAR: S1 and S2. Regular rate and rhythm.  Grade 1/6 systolic ejection murmur left sternal border.  Gallops or rubs noted. ABDOMEN: Benign. MUSCULOSKELETAL: No joint deformity, no clubbing, no edema.  NEUROLOGIC: No overt focal deficit, no gait disturbance, speech is fluent. SKIN: Intact,warm,dry. PSYCH: Mood and behavior normal       Assessment & Plan:     ICD-10-CM   1. SOB (shortness of breath)  R06.02 ECHOCARDIOGRAM COMPLETE    Pulmonary Function Test [facility] Only    DG Chest 2 View   Change chest x-ray Obtain PFTs and echocardiogram    2. Persistent asthma without complication, unspecified asthma severity  J45.909 albuterol (VENTOLIN HFA) 108 (90 Base) MCG/ACT inhaler   Albuterol as needed Stop Trelegy since she feels it is not helping     Orders Placed This Encounter  Procedures    DG Chest 2 View    Standing Status:   Future    Standing Expiration Date:   11/12/2022    Order Specific Question:   Reason for Exam (SYMPTOM  OR DIAGNOSIS REQUIRED)    Answer:   sob    Order Specific Question:   Preferred imaging location?    Answer:   [facility]   Pulmonary Function Test [facility] Only    Standing Status:   Future    Standing Expiration Date:   11/12/2022    Order Specific Question:   Full PFT: includes the following: basic spirometry, spirometry pre & post bronchodilator, diffusion capacity (DLCO), lung volumes    Answer:   Full PFT    Order Specific Question:   This test can only be performed at    Answer:   [place]   ECHOCARDIOGRAM COMPLETE    Standing Status:   Future    Standing Expiration Date:   05/14/2022    Order Specific Question:   Where should this test be performed    Answer:   [facility]    Order Specific Question:   Please indicate who you request to read the nuc med / echo results.    Answer:   [facility]    Order Specific Question:   Perflutren DEFINITY (image enhancing agent) should be administered unless hypersensitivity or allergy exist    Answer:   Administer Perflutren    Order Specific Question:   Reason for exam-Echo    Answer:   SBE I33.9   Meds ordered this encounter  Medications   albuterol (VENTOLIN HFA) 108 (90 Base) MCG/ACT inhaler    Sig: Inhale 2 puffs into the lungs every 6 (six) hours as needed for wheezing or shortness of breath.    Dispense:  18 g    Refill:  2   Order appropriate testing to try to delve into the patient's concerns of dyspnea.  Will see her in follow-up in 6 to 8 weeks time she is to call sooner should any new problems arise.  Renold Don, MD Advanced Bronchoscopy PCCM [facility]    *This note was dictated using voice recognition software/Dragon.  Despite best efforts to proofread, errors can occur which can change the meaning. Any transcriptional errors that  result from this process are unintentional and may not be fully corrected at the time of dictation.

## 2021-11-15 ENCOUNTER — Telehealth (INDEPENDENT_AMBULATORY_CARE_PROVIDER_SITE_OTHER): Payer: Medicare HMO | Admitting: Nurse Practitioner

## 2021-11-15 ENCOUNTER — Encounter: Payer: Self-pay | Admitting: Nurse Practitioner

## 2021-11-15 ENCOUNTER — Other Ambulatory Visit: Payer: Self-pay

## 2021-11-15 DIAGNOSIS — J069 Acute upper respiratory infection, unspecified: Secondary | ICD-10-CM

## 2021-11-15 DIAGNOSIS — J449 Chronic obstructive pulmonary disease, unspecified: Secondary | ICD-10-CM | POA: Diagnosis not present

## 2021-11-15 MED ORDER — PROMETHAZINE-DM 6.25-15 MG/5ML PO SYRP: 5.0000 mL | ORAL_SOLUTION | Freq: Four times a day (QID) | ORAL | 0 refills | Status: DC | PRN

## 2021-11-15 NOTE — Progress Notes (Signed)
? ?Name: Lori Potts   MRN: [id number]    DOB: Jun 03, 1946   Date:11/15/2021 ? ?     Progress Note ? ?Subjective ? ?Chief Complaint ? ?Chief Complaint  ?Patient presents with  ? URI  ?  Sore throat, cough, runny nose for 4 days  ? ? ?I connected with  [name]  on 11/15/21 at 10:45 am by a video enabled telemedicine application and verified that I am speaking with the correct person using two identifiers.  I discussed the limitations of evaluation and management by telemedicine and the availability of in person appointments. The patient expressed understanding and agreed to proceed with a virtual visit  Staff also discussed with the patient that there may be a patient responsible charge related to this service. ?Patient Location: home ?Provider Location: [facility] ?Additional Individuals present: alone ? ?HPI ? ?URI: She says symptoms started on Friday with cough, sore throat and runny nose. She says she did a home covid test and it was negative. She denies any shortness of breath or fever.  Discussed pcr covid testing and flu testing and she declined. She says she is taking her Singulair, xyzal, astelin and flonase.  Discussed adding mucinex.  She is requesting some cough medication.  Discussed pushing fluids and getting rest.   ? ?COPD: she says she is not short of breath just has a cough.  She denies any changes in her mucous.  She is currently using albuterol and trelegy.  ? ?Patient Active Problem List  ? Diagnosis Date Noted  ? Neutropenia, unspecified type (HCC) 11/25/2020  ? Intractable migraine with aura without status migrainosus 04/18/2020  ? Body mass index (BMI) 31.0-31.9, adult 02/06/2020  ? Chronic left hip pain 12/26/2019  ? Anemia 09/22/2017  ? S/P knee replacement 07/28/2017  ? Allergic rhinitis 05/26/2017  ? Vitamin D deficiency 05/19/2017  ? Chronic nonintractable headache 05/18/2017  ? Advanced care planning/counseling discussion   ? Gastroesophageal reflux disease   ? Calcification of aorta  (H[facility]) 03/02/2017  ? Insomnia 11/29/2016  ? Chronic right-sided low back pain with right-sided sciatica 04/19/2016  ? Abdominal wall pain in right flank 03/29/2016  ? Diverticulosis 02/24/2016  ? Hypercholesterolemia 06/16/2015  ? Chronic pain 04/13/2015  ? Neck pain 02/26/2015  ? Status post lumbar surgery 02/26/2015  ? Abnormal ECG 01/27/2015  ? Angina pectoris (H[facility]) 01/27/2015  ? 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 Essential (primary) hypertension 01/27/2015  ? ? ?Social History  ? ?Tobacco Use  ? Smoking status: Former  ?  Types: Cigarettes  ?  Quit date: 2006  ?  Years since quitting: 17.2  ? Smokeless tobacco: Never  ? Tobacco comments:  ?  quit smoking 7yrs ago  ?Substance Use Topics  ? Alcohol use: No  ? ? ? ?Current Outpatient Medications:  ?  albuterol (VENTOLIN HFA) 108 (90 Base) MCG/ACT inhaler, Inhale 2 puffs into the lungs every 6 (six) hours as needed for wheezing or shortness of breath., Disp: 18 g, Rfl: 2 ?  ascorbic acid (VITAMIN C) 500 MG tablet, Take 500 mg by mouth daily., Disp: , Rfl:  ?  aspirin 81 MG EC tablet, Take 1 tablet by mouth daily., Disp: , Rfl:  ?  azelastine (ASTELIN) 0.1 % nasal spray, PLACE 1 SPRAY INTO BOTH NOSTRILS 2 (TWO) TIMES DAILY. USE IN EACH NOSTRIL AS DIRECTED, Disp: 90 mL, Rfl: 1 ?  citalopram (CELEXA) 40 MG tablet, Take 1 tablet (40 mg total)  by mouth daily., Disp: 90 tablet, Rfl: 1 ?  esomeprazole (NEXIUM) 40 MG capsule, Take 1 capsule (40 mg total) by mouth daily. Follow up in office for more refills, Disp: 90 capsule, Rfl: 1 ?  fluticasone (FLONASE) 50 MCG/ACT nasal spray, PLACE 2 SPRAYS INTO BOTH NOSTRILS EVERY EVENING., Disp: 48 mL, Rfl: 1 ?  Fluticasone-Umeclidin-Vilant (TRELEGY ELLIPTA) 100-62.5-25 MCG/ACT AEPB, Inhale 1 puff into the lungs daily., Disp: 1 each, Rfl: 11 ?  Galcanezumab-gnlm (EMGALITY) 120 MG/ML SOAJ, Inject 120 mg into the skin every 30 (thirty) days., Disp: 1.12 mL, Rfl: 3 ?  glipiZIDE  (GLUCOTROL XL) 2.5 MG 24 hr tablet, TAKE 1 TABLET BY MOUTH DAILY WITH BREAKFAST., Disp: 30 tablet, Rfl: 0 ?  levocetirizine (XYZAL) 5 MG tablet, Take 1 tablet (5 mg total) by mouth every evening., Disp: 30 tablet, Rfl: 2 ?  losartan (COZAAR) 25 MG tablet, TAKE 1 TABLET EVERY DAY (Patient taking differently: Take 25 mg by mouth daily.), Disp: 90 tablet, Rfl: 3 ?  methocarbamol (ROBAXIN) 500 MG tablet, Take 500 mg by mouth every 8 (eight) hours as needed for muscle spasms., Disp: , Rfl:  ?  montelukast (SINGULAIR) 10 MG tablet, TAKE 1 TABLET BY MOUTH EVERYDAY AT BEDTIME, Disp: 90 tablet, Rfl: 3 ?  Multiple Vitamins-Minerals (MULTIVITAMIN WITH MINERALS) tablet, Take 1 tablet by mouth daily., Disp: , Rfl:  ?  Oxycodone HCl 10 MG TABS, , Disp: , Rfl:  ?  promethazine-dextromethorphan (PROMETHAZINE-DM) 6.25-15 MG/5ML syrup, Take 5 mLs by mouth 4 (four) times daily as needed for cough., Disp: 118 mL, Rfl: 0 ?  propranolol (INDERAL) 10 MG tablet, TAKE 1 TABLET BY MOUTH THREE TIMES A DAY, Disp: 270 tablet, Rfl: 3 ?  rosuvastatin (CRESTOR) 40 MG tablet, TAKE 1 TABLET BY MOUTH EVERYDAY AT BEDTIME, Disp: 90 tablet, Rfl: 0 ?  Semaglutide,0.25 or 0.5MG /DOS, (OZEMPIC, 0.25 OR 0.5 MG/DOSE,) 2 MG/1.5ML SOPN, Inject 0.25 mg into the skin once a week., Disp: 1.5 mL, Rfl: 2 ?  sodium chloride (OCEAN) 0.65 % SOLN nasal spray, Place 1 spray into both nostrils 2 (two) times daily as needed for congestion., Disp: 60 mL, Rfl: 5 ? ?Allergies  ?Allergen Reactions  ? Metformin And Related Diarrhea  ? Adhesive [Tape] Rash and Other (See Comments)  ?  Regular tape is ok, allergy is to paper tape  ? ? ?I personally reviewed active problem list, medication list, allergies, notes from last encounter with the patient/caregiver today. ? ?ROS ? ?Constitutional: Negative for fever or weight change.  ?HEENT: positive for runny nose, sore throat ?Respiratory: Positive for cough and negative for shortness of breath.   ?Cardiovascular: Negative for chest  pain or palpitations.  ?Gastrointestinal: Negative for abdominal pain, no bowel changes.  ?Musculoskeletal: Negative for gait problem or joint swelling.  ?Skin: Negative for rash.  ?Neurological: Negative for dizziness or headache.  ?No other specific complaints in a complete review of systems (except as listed in HPI above).  ? ?Objective ? ?Virtual encounter, vitals not obtained. ? ?There is no height or weight on file to calculate BMI. ? ?Nursing Note and Vital Signs reviewed. ? ?Physical Exam ? ?Awake, alert and oriented, speaking in complete sentences ? ?No results found for this or any previous visit (from the past 72 hour(s)). ? ?Assessment & Plan ? ?1. Viral upper respiratory tract infection ?-push fluids, get rest ?-continue taking allergy medication ?- promethazine-dextromethorphan (PROMETHAZINE-DM) 6.25-15 MG/5ML syrup; Take 5 mLs by mouth 4 (four) times daily as needed for cough.  Dispense:  118 mL; Refill: 0  ? ?-Red flags and when to present for emergency care or RTC including fever >101.39F, chest pain, shortness of breath, new/worsening/un-resolving symptoms,  reviewed with patient at time of visit. Follow up and care instructions discussed and provided in AVS. ?- I discussed the assessment and treatment plan with the patient. The patient was provided an opportunity to ask questions and all were answered. The patient agreed with the plan and demonstrated an understanding of the instructions. ? ?I provided 15 minutes of non-face-to-face time during this encounter. ? ?Berniece SalinesJulie F Delmar Arriaga, [name] ? ?  ?

## 2021-11-18 ENCOUNTER — Ambulatory Visit (INDEPENDENT_AMBULATORY_CARE_PROVIDER_SITE_OTHER): Payer: Medicare HMO

## 2021-11-18 ENCOUNTER — Telehealth: Payer: Self-pay

## 2021-11-18 DIAGNOSIS — Z1231 Encounter for screening mammogram for malignant neoplasm of breast: Secondary | ICD-10-CM | POA: Diagnosis not present

## 2021-11-18 DIAGNOSIS — Z0000 Encounter for general adult medical examination without abnormal findings: Secondary | ICD-10-CM

## 2021-11-18 DIAGNOSIS — Z780 Asymptomatic menopausal state: Secondary | ICD-10-CM

## 2021-11-18 NOTE — Telephone Encounter (Signed)
Please see nurse notes from [place] today. Pt requests rx for diabetic testing supplies to be sent to [facility]. She previously used freestyle libre but would be fine with a regular meter with test strips and lancets to be able to check blood sugar at home if needed. Thank you.  ? ?

## 2021-11-18 NOTE — Patient Instructions (Signed)
[name] , ?Thank you for taking time to come for your Medicare Wellness Visit. I appreciate your ongoing commitment to your health goals. Please review the following plan we discussed and let me know if I can assist you in the future.  ? ?Screening recommendations/referrals: ?Colonoscopy: done 04/10/17. Repeat 03/2027 ?Mammogram: done 05/04/17. Please call [phone] to schedule your mammogram and bone density screening ?Bone Density: done 09/26/13.  ?Recommended yearly ophthalmology/optometry visit for glaucoma screening and checkup ?Recommended yearly dental visit for hygiene and checkup ? ?Vaccinations: ?Influenza vaccine: done 06/07/21 ?Pneumococcal vaccine: done 06/07/21 ?Tdap vaccine: done 05/08/19 ?Shingles vaccine: Shingrix discussed. Please contact your pharmacy for coverage information.  ?Covid-19:done 09/11/19, 10/02/19, 06/08/20, 12/29/20 & 07/14/21 ? ?Advanced directives: Please bring a copy of your health care power of attorney and living will to the office at your convenience.  ? ?Conditions/risks identified: Recommend increasing physical activity as tolerated ? ?Next appointment: Follow up in one year for your annual wellness visit  ? ? ?Preventive Care 5965 Years and Older, Female ?Preventive care refers to lifestyle choices and visits with your health care provider that can promote health and wellness. ?What does preventive care include? ?A yearly physical exam. This is also called an annual well check. ?Dental exams once or twice a year. ?Routine eye exams. Ask your health care provider how often you should have your eyes checked. ?Personal lifestyle choices, including: ?Daily care of your teeth and gums. ?Regular physical activity. ?Eating a healthy diet. ?Avoiding tobacco and drug use. ?Limiting alcohol use. ?Practicing safe sex. ?Taking low-dose aspirin every day. ?Taking vitamin and mineral supplements as recommended by your health care provider. ?What happens during an annual well check? ?The  services and screenings done by your health care provider during your annual well check will depend on your age, overall health, lifestyle risk factors, and family history of disease. ?Counseling  ?Your health care provider may ask you questions about your: ?Alcohol use. ?Tobacco use. ?Drug use. ?Emotional well-being. ?Home and relationship well-being. ?Sexual activity. ?Eating habits. ?History of falls. ?Memory and ability to understand (cognition). ?Work and work Astronomerenvironment. ?Reproductive health. ?Screening  ?You may have the following tests or measurements: ?Height, weight, and BMI. ?Blood pressure. ?Lipid and cholesterol levels. These may be checked every 5 years, or more frequently if you are over 76 years old. ?Skin check. ?Lung cancer screening. You may have this screening every year starting at age 76 if you have a 30-pack-year history of smoking and currently smoke or have quit within the past 15 years. ?Fecal occult blood test (FOBT) of the stool. You may have this test every year starting at age 76. ?Flexible sigmoidoscopy or colonoscopy. You may have a sigmoidoscopy every 5 years or a colonoscopy every 10 years starting at age 76. ?Hepatitis C blood test. ?Hepatitis B blood test. ?Sexually transmitted disease (STD) testing. ?Diabetes screening. This is done by checking your blood sugar (glucose) after you have not eaten for a while (fasting). You may have this done every 1-3 years. ?Bone density scan. This is done to screen for osteoporosis. You may have this done starting at age 76. ?Mammogram. This may be done every 1-2 years. Talk to your health care provider about how often you should have regular mammograms. ?Talk with your health care provider about your test results, treatment options, and if necessary, the need for more tests. ?Vaccines  ?Your health care provider may recommend certain vaccines, such as: ?Influenza vaccine. This is recommended every year. ?Tetanus, diphtheria, and  acellular  pertussis (Tdap, Td) vaccine. You may need a Td booster every 10 years. ?Zoster vaccine. You may need this after age 60. ?Pneumococcal 13-valent conjugate (PCV13) vaccine. One dose is recommended after age 53. ?Pneumococcal polysaccharide (PPSV23) vaccine. One dose is recommended after age 66. ?Talk to your health care provider about which screenings and vaccines you need and how often you need them. ?This information is not intended to replace advice given to you by your health care provider. Make sure you discuss any questions you have with your health care provider. ?Document Released: 09/04/2015 Document Revised: 04/27/2016 Document Reviewed: 06/09/2015 ?Elsevier Interactive Patient Education ? 2017 Elsevier Inc. ? ?Fall Prevention in the Home ?Falls can cause injuries. They can happen to people of all ages. There are many things you can do to make your home safe and to help prevent falls. ?What can I do on the outside of my home? ?Regularly fix the edges of walkways and driveways and fix any cracks. ?Remove anything that might make you trip as you walk through a door, such as a raised step or threshold. ?Trim any bushes or trees on the path to your home. ?Use bright outdoor lighting. ?Clear any walking paths of anything that might make someone trip, such as rocks or tools. ?Regularly check to see if handrails are loose or broken. Make sure that both sides of any steps have handrails. ?Any raised decks and porches should have guardrails on the edges. ?Have any leaves, snow, or ice cleared regularly. ?Use sand or salt on walking paths during winter. ?Clean up any spills in your garage right away. This includes oil or grease spills. ?What can I do in the bathroom? ?Use night lights. ?Install grab bars by the toilet and in the tub and shower. Do not use towel bars as grab bars. ?Use non-skid mats or decals in the tub or shower. ?If you need to sit down in the shower, use a plastic, non-slip stool. ?Keep the floor  dry. Clean up any water that spills on the floor as soon as it happens. ?Remove soap buildup in the tub or shower regularly. ?Attach bath mats securely with double-sided non-slip rug tape. ?Do not have throw rugs and other things on the floor that can make you trip. ?What can I do in the bedroom? ?Use night lights. ?Make sure that you have a light by your bed that is easy to reach. ?Do not use any sheets or blankets that are too big for your bed. They should not hang down onto the floor. ?Have a firm chair that has side arms. You can use this for support while you get dressed. ?Do not have throw rugs and other things on the floor that can make you trip. ?What can I do in the kitchen? ?Clean up any spills right away. ?Avoid walking on wet floors. ?Keep items that you use a lot in easy-to-reach places. ?If you need to reach something above you, use a strong step stool that has a grab bar. ?Keep electrical cords out of the way. ?Do not use floor polish or wax that makes floors slippery. If you must use wax, use non-skid floor wax. ?Do not have throw rugs and other things on the floor that can make you trip. ?What can I do with my stairs? ?Do not leave any items on the stairs. ?Make sure that there are handrails on both sides of the stairs and use them. Fix handrails that are broken or loose. Make sure that  handrails are as long as the stairways. ?Check any carpeting to make sure that it is firmly attached to the stairs. Fix any carpet that is loose or worn. ?Avoid having throw rugs at the top or bottom of the stairs. If you do have throw rugs, attach them to the floor with carpet tape. ?Make sure that you have a light switch at the top of the stairs and the bottom of the stairs. If you do not have them, ask someone to add them for you. ?What else can I do to help prevent falls? ?Wear shoes that: ?Do not have high heels. ?Have rubber bottoms. ?Are comfortable and fit you well. ?Are closed at the toe. Do not wear  sandals. ?If you use a stepladder: ?Make sure that it is fully opened. Do not climb a closed stepladder. ?Make sure that both sides of the stepladder are locked into place. ?Ask someone to hold it for you, if poss

## 2021-11-18 NOTE — Addendum Note (Signed)
Addended by: Reather Littler D on: 11/18/2021 04:03 PM ? ? Modules accepted: Orders ? ?

## 2021-11-18 NOTE — Progress Notes (Signed)
? ?Subjective:  ? Lori Potts is a 76 y.o. female who presents for Medicare Annual (Subsequent) preventive examination. ? ?Virtual Visit via Telephone Note ? ?I connected with  Lori Potts on 11/18/21 at  2:45 PM EDT by telephone and verified that I am speaking with the correct person using two identifiers. ? ?Location: ?Patient: home ?Provider: [facility] ?Persons participating in the virtual visit: patient/Nurse Health Advisor ?  ?I discussed the limitations, risks, security and privacy concerns of performing an evaluation and management service by telephone and the availability of in person appointments. The patient expressed understanding and agreed to proceed. ? ?Interactive audio and video telecommunications were attempted between this nurse and patient, however failed, due to patient having technical difficulties OR patient did not have access to video capability.  We continued and completed visit with audio only. ? ?Some vital signs may be absent or patient reported.  ? ?Reather Littler, LPN ? ? ?Review of Systems    ? ?Cardiac Risk Factors include: advanced age (>31men, >35 women);diabetes mellitus;dyslipidemia;hypertension ? ?   ?Objective:  ?  ?There were no vitals filed for this visit. ?There is no height or weight on file to calculate BMI. ? ? ?  11/18/2021  ?  2:38 PM 11/05/2020  ?  9:48 AM 09/17/2020  ?  7:37 AM 09/10/2020  ? 10:55 AM 11/05/2019  ?  9:30 AM 10/30/2018  ?  9:49 AM 09/10/2018  ? 12:10 PM  ?Advanced Directives  ?Does Patient Have a Medical Advance Directive? Yes Yes Yes Yes No Yes Yes  ?Type of Estate agent of [place];Living will Healthcare Power of [place];Living will Living will Living will  Living will;Healthcare Power of [employer] Power of Attorney  ?Does patient want to make changes to medical advance directive?   No - Patient declined    No - Patient declined  ?Copy of Healthcare Power of Attorney in Chart? No - copy requested No - copy requested    No -  copy requested No - copy requested  ?Would patient like information on creating a medical advance directive?     Yes (MAU/Ambulatory/Procedural Areas - Information given)    ? ? ?Current Medications (verified) ?Outpatient Encounter Medications as of 11/18/2021  ?Medication Sig  ? albuterol (VENTOLIN HFA) 108 (90 Base) MCG/ACT inhaler Inhale 2 puffs into the lungs every 6 (six) hours as needed for wheezing or shortness of breath.  ? ascorbic acid (VITAMIN C) 500 MG tablet Take 500 mg by mouth daily.  ? aspirin 81 MG EC tablet Take 1 tablet by mouth daily.  ? azelastine (ASTELIN) 0.1 % nasal spray PLACE 1 SPRAY INTO BOTH NOSTRILS 2 (TWO) TIMES DAILY. USE IN EACH NOSTRIL AS DIRECTED  ? citalopram (CELEXA) 40 MG tablet Take 1 tablet (40 mg total) by mouth daily.  ? esomeprazole (NEXIUM) 40 MG capsule Take 1 capsule (40 mg total) by mouth daily. Follow up in office for more refills  ? fluticasone (FLONASE) 50 MCG/ACT nasal spray PLACE 2 SPRAYS INTO BOTH NOSTRILS EVERY EVENING.  ? Fluticasone-Umeclidin-Vilant (TRELEGY ELLIPTA) 100-62.5-25 MCG/ACT AEPB Inhale 1 puff into the lungs daily.  ? Galcanezumab-gnlm (EMGALITY) 120 MG/ML SOAJ Inject 120 mg into the skin every 30 (thirty) days.  ? glipiZIDE (GLUCOTROL XL) 2.5 MG 24 hr tablet TAKE 1 TABLET BY MOUTH DAILY WITH BREAKFAST.  ? levocetirizine (XYZAL) 5 MG tablet Take 1 tablet (5 mg total) by mouth every evening.  ? losartan (COZAAR) 25 MG tablet TAKE 1 TABLET  EVERY DAY (Patient taking differently: Take 25 mg by mouth daily.)  ? methocarbamol (ROBAXIN) 500 MG tablet Take 500 mg by mouth every 8 (eight) hours as needed for muscle spasms.  ? montelukast (SINGULAIR) 10 MG tablet TAKE 1 TABLET BY MOUTH EVERYDAY AT BEDTIME  ? Multiple Vitamins-Minerals (MULTIVITAMIN WITH MINERALS) tablet Take 1 tablet by mouth daily.  ? Oxycodone HCl 10 MG TABS   ? promethazine-dextromethorphan (PROMETHAZINE-DM) 6.25-15 MG/5ML syrup Take 5 mLs by mouth 4 (four) times daily as needed for cough.   ? propranolol (INDERAL) 10 MG tablet TAKE 1 TABLET BY MOUTH THREE TIMES A DAY  ? rosuvastatin (CRESTOR) 40 MG tablet TAKE 1 TABLET BY MOUTH EVERYDAY AT BEDTIME  ? Semaglutide,0.25 or 0.5MG /DOS, (OZEMPIC, 0.25 OR 0.5 MG/DOSE,) 2 MG/1.5ML SOPN Inject 0.25 mg into the skin once a week.  ? sodium chloride (OCEAN) 0.65 % SOLN nasal spray Place 1 spray into both nostrils 2 (two) times daily as needed for congestion.  ? ?No facility-administered encounter medications on file as of 11/18/2021.  ? ? ?Allergies (verified) ?Metformin and related and Adhesive [tape]  ? ?History: ?Past Medical History:  ?Diagnosis Date  ? Anxiety   ? Arthritis   ? Asthma   ? 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 Cataract   ? right eye but immature  ? Essential hypertension, benign   ? takes Lisinopril-HCTZ daily  ? GERD (gastroesophageal reflux disease)   ? Headache   ? sinus  ? History of colon polyps   ? benign  ? History of migraine   ? History of shingles   ? Hyperlipidemia   ? takes Lipitor daily  ? Low back pain   ? Nocturia   ? S/P insertion of spinal cord stimulator   ? Seasonal allergies   ? takes Singulair daily as needed  ? Type II or unspecified type diabetes mellitus without mention of complication, not stated as uncontrolled   ? Vitamin D deficiency   ? takes Vit D weekly  ? Weakness   ? numbness and tingling left arm  ? Wears dentures   ? full upper and lower  ? ?Past Surgical History:  ?Procedure Laterality Date  ? ABDOMINAL HYSTERECTOMY    ? ANTERIOR CERVICAL DECOMP/DISCECTOMY FUSION N/A 02/26/2015  ? Procedure: ANTERIOR CERVICAL DISCECTOMY FUSION C4-5 (1 LEVEL);  Surgeon: Venita Lick, MD;  Location: [facility] OR;  Service: Orthopedics;  Laterality: N/A;  ? ANTERIOR LAT LUMBAR FUSION N/A 03/21/2013  ? Procedure: ANTERIOR LATERAL LUMBAR FUSION 1 LEVEL/ XLIF L3-L4 ;  Surgeon: Venita Lick, MD;  Location: [facility] OR;  Service: Orthopedics;  Laterality: N/A;  ? APPENDECTOMY    ? AUGMENTATION MAMMAPLASTY Bilateral 1978  ? BACK SURGERY    ?  BACK SURGERY    ? Lumbar fusion x 2  ? BREAST EXCISIONAL BIOPSY Right 1970  ? BREAST SURGERY  1990  ? Augementation  ? CARDIAC CATHETERIZATION  5/12  ? ef 55%  ? CARDIAC CATHETERIZATION  10/2010  ? [facility]; EF 55%  ? CARDIAC CATHETERIZATION Left 02/16/2016  ? Procedure: Left Heart Cath and Coronary Angiography;  Surgeon: Alwyn Pea, MD;  Location: [facility] INVASIVE CV LAB;  Service: Cardiovascular;  Laterality: Left;  ? COLONOSCOPY    ? COLONOSCOPY WITH PROPOFOL N/A 04/10/2017  ? Procedure: COLONOSCOPY WITH PROPOFOL;  Surgeon: Midge Minium, MD;  [facility];  Service: Gastroenterology;  Laterality: N/A;  Diabetic - insulin  ? ESOPHAGOGASTRODUODENOSCOPY    ? JOINT REPLACEMENT    ?  KIDNEY SURGERY  1998  ? growth removed from left kidney   ? LEFT HEART CATH AND CORONARY ANGIOGRAPHY Left 09/10/2018  ? Procedure: LEFT HEART CATH AND CORONARY ANGIOGRAPHY;  Surgeon: Alwyn Peaallwood, Dwayne D, MD;  Location: [facility] INVASIVE CV LAB;  Service: Cardiovascular;  Laterality: Left;  ? pain stimulator    ? POSTERIOR CERVICAL FUSION/FORAMINOTOMY Right 03/21/2013  ? Procedure: POSTERIOR L2-3 RIGHT FORAMINOTOMY;  Surgeon: Venita Lickahari Brooks, MD;  Location: [facility] OR;  Service: Orthopedics;  Laterality: Right;  ? REVISION OF SCAR TISSUE RECTUS MUSCLE    ? SHOULDER ARTHROSCOPY WITH ROTATOR CUFF REPAIR AND SUBACROMIAL DECOMPRESSION Right 09/17/2020  ? Procedure: RIGHT SHOULDER ARTHROSCOPY WITH MINI-OPEN ROTATOR CUFF REPAIR, DISTAL CLAVICLE EXCISION, AND SUBACROMIAL DECOMPRESSION WITH BICEP TENDONESIS;  Surgeon: Juanell FairlyKrasinski, Kevin, MD;  Location: [facility] ORS;  Service: Orthopedics;  Laterality: Right;  ? SMALL BOWEL REPAIR    ? SPINAL CORD STIMULATOR BATTERY EXCHANGE N/A 10/17/2012  ? Procedure: SPINAL CORD STIMULATOR BATTERY REMOVAL;  Surgeon: Venita Lickahari Brooks, MD;  Location: [facility] OR;  Service: Orthopedics;  Laterality: N/A;  ? SPINAL CORD STIMULATOR BATTERY EXCHANGE N/A 07/22/2015  ? Procedure: REIMPLANTATION OF SPINAL CORD STIMULATOR BATTERY ;   Surgeon: Venita Lickahari Brooks, MD;  Location: [facility] OR;  Service: Orthopedics;  Laterality: N/A;  ? TOTAL KNEE ARTHROPLASTY Left   ? TOTAL KNEE ARTHROPLASTY Right 07/28/2017  ? Procedure: RIGHT TOTAL KNEE ARTHROPLASTY;

## 2021-11-19 ENCOUNTER — Other Ambulatory Visit: Payer: Self-pay | Admitting: Nurse Practitioner

## 2021-11-19 DIAGNOSIS — E0829 Diabetes mellitus due to underlying condition with other diabetic kidney complication: Secondary | ICD-10-CM

## 2021-11-19 MED ORDER — BLOOD GLUCOSE MONITOR KIT: PACK | 0 refills | Status: DC

## 2021-11-19 NOTE — Telephone Encounter (Signed)
Requested medication (s) are due for refill today: yes ? ?Requested medication (s) are on the active medication list: yes ? ?Last refill:   ? ?Future visit scheduled: no ? ?Notes to clinic:  Changes Requested ? ? Blood Glucose Monitoring Suppl (ACCU-CHEK GUIDE ME) w/Device KIT  ?    ? Changed from: blood glucose meter kit and supplies KIT  ? Sig: DISPENSE BASED ON PATIENT AND INSURANCE PREFERENCE. USE UP TO FOUR TIMES DAILY AS DIRECTED. (FOR ICD-9 250.00, 250.01).  ? Disp:  Not specified (Pharmacy requested: 1 each)    Refills:  0  ? Start: 11/19/2021  ? Class: Normal  ? 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 Last ordered: Today by Bo Merino, [name] Last refill: 11/19/2021  ? Rx #: [id number]  ? Pharmacy comment: Script Clarification:NEED SEPARATE RX'S FOR STRIPS AND LANCETES.  ?  ? ? ? ?  ?Requested Prescriptions  ?Pending Prescriptions Disp Refills  ? Blood Glucose Monitoring Suppl (ACCU-CHEK GUIDE ME) w/Device KIT [Pharmacy Med Name: ACCU-CHEK GUIDE ME GLUCOSE MTR]  0  ?  Sig: DISPENSE BASED ON PATIENT AND INSURANCE PREFERENCE. USE UP TO FOUR TIMES DAILY AS DIRECTED. (FOR ICD-9 250.00, 250.01).  ?  ? Endocrinology: Diabetes - Testing Supplies Passed - 11/19/2021  9:00 AM  ?  ?  Passed - Valid encounter within last 12 months  ?  Recent Outpatient Visits   ? ?      ? 4 days ago Viral upper respiratory tract infection  ? [facility] Serafina Royals F, [name]  ? 1 month ago Viral upper respiratory tract infection  ? [facility] Serafina Royals F, [name]  ? 2 months ago Moderate persistent asthma, unspecified whether complicated  ? [facility] Serafina Royals F, [name]  ? 2 months ago Viral upper respiratory tract infection  ? [place], [name]  ? 3 months ago Anxiety  ? [facility] Bo Merino, [name]  ? ?  ?  ?Future  Appointments   ? ?        ? In 1 month Tyler Pita, MD [facility]  ? ?  ? ?  ?  ?  ? ?

## 2021-11-22 ENCOUNTER — Encounter: Payer: Self-pay | Admitting: Internal Medicine

## 2021-11-22 ENCOUNTER — Ambulatory Visit (INDEPENDENT_AMBULATORY_CARE_PROVIDER_SITE_OTHER): Payer: Medicare HMO | Admitting: Internal Medicine

## 2021-11-22 ENCOUNTER — Ambulatory Visit (INDEPENDENT_AMBULATORY_CARE_PROVIDER_SITE_OTHER): Payer: Medicare HMO

## 2021-11-22 VITALS — BP 142/80 | HR 73 | Temp 97.7°F | Resp 16 | Ht 64.0 in | Wt 188.0 lb

## 2021-11-22 DIAGNOSIS — R0602 Shortness of breath: Secondary | ICD-10-CM

## 2021-11-22 DIAGNOSIS — H8309 Labyrinthitis, unspecified ear: Secondary | ICD-10-CM | POA: Diagnosis not present

## 2021-11-22 DIAGNOSIS — R062 Wheezing: Secondary | ICD-10-CM

## 2021-11-22 LAB — ECHOCARDIOGRAM COMPLETE
AR max vel: 2.14 cm2
AV Area VTI: 1.98 cm2
AV Area mean vel: 2.14 cm2
AV Mean grad: 5 mmHg
AV Peak grad: 7.8 mmHg
Ao pk vel: 1.4 m/s
Area-P 1/2: 3.21 cm2
S' Lateral: 2.2 cm
Single Plane A4C EF: 51.1 %

## 2021-11-22 MED ORDER — METHYLPREDNISOLONE 4 MG PO TBPK: ORAL_TABLET | ORAL | 0 refills | Status: DC

## 2021-11-22 NOTE — Patient Instructions (Addendum)
It was great seeing you today! ? ?Plan discussed at today's visit: ?-Use steroid pack for symptoms ?-Use Albuterol as needed for wheezing, can use every 2 hours  ? ?Follow up in: as needed  ? ?Take care and let us know if you have any questions or concerns prior to your next visit. ? ?Dr. Caralee Ates ? ?Vertigo ?Vertigo is the feeling that you or the things around you are moving when they are not. This feeling can come and go at any time. Vertigo often goes away on its own. This condition can be dangerous if it happens when you are doing activities like driving or working with machines. ?Your doctor will do tests to find the cause of your vertigo. These tests will also help your doctor decide on the best treatment for you. ?Follow these instructions at home: ?Eating and drinking ?A comparison of three sample cups showing dark yellow, yellow, and pale yellow urine. ? ?  ?A sign showing that a person should not drink beer, wine, or liquor. ? ?Drink enough fluid to keep your pee (urine) pale yellow. ?Do not drink alcohol. ?Activity ?Return to your normal activities when your doctor says that it is safe. ?In the morning, first sit up on the side of the bed. When you feel okay, stand slowly while you hold onto something until you know that your balance is fine. ?Move slowly. Avoid sudden body or head movements or certain positions, as told by your doctor. ?Use a cane if you have trouble standing or walking. ?Sit down right away if you feel dizzy. ?Avoid doing any tasks or activities that can cause danger to you or others if you get dizzy. ?Avoid bending down if you feel dizzy. Place items in your home so that they are easy for you to reach without bending or leaning over. ?Do not drive or use machinery if you feel dizzy. ?General instructions ?Take over-the-counter and prescription medicines only as told by your doctor. ?Keep all follow-up visits. ?Contact a doctor if: ?Your medicine does not help your vertigo. ?Your  problems get worse or you have new symptoms. ?You have a fever. ?You feel like you may vomit (nauseous), or this feeling gets worse. ?You start to vomit. ?Your family or friends see changes in how you act. ?You lose feeling (have numbness) in part of your body. ?You feel prickling and tingling in a part of your body. ?Get help right away if: ?You are always dizzy. ?You faint. ?You get very bad headaches. ?You get a stiff neck. ?Bright light starts to bother you. ?You have trouble moving or talking. ?You feel weak in your hands, arms, or legs. ?You have changes in your hearing or in how you see (vision). ?These symptoms may be an emergency. Get help right away. Call your local emergency services (911 in the [place] ?Do not wait to see if the symptoms will go away. ?Do not drive yourself to the hospital. ?Summary ?Vertigo is the feeling that you or the things around you are moving when they are not. ?Your doctor will do tests to find the cause of your vertigo. ?You may be told to avoid some tasks, positions, or movements. ?Contact a doctor if your medicine is not helping, or if you have a fever, new symptoms, or a change in how you act. ?Get help right away if you get very bad headaches, or if you have changes in how you speak, hear, or see. ?This information is not intended to replace advice given  to you by your health care provider. Make sure you discuss any questions you have with your health care provider. ?Document Revised: 07/08/2020 Document Reviewed: 07/08/2020 ?Elsevier Patient Education ? 2022 Elsevier Inc. ?  ?

## 2021-11-22 NOTE — Progress Notes (Signed)
? ?Acute Office Visit ? ?Subjective:  ? ? Patient ID: Lori Potts, female    DOB: 11/10/1945, 76 y.o.   MRN: [id number] ? ?Chief Complaint  ?Patient presents with  ? Dizziness  ? ? ?HPI ?Patient is in today for dizziness. First time it happened today about 1 hour ago, room spinning. Was sitting at desk. Did not lose consciousness. Dizziness lasted about 3-4 minutes. Drank some water and laid down. Takes allergy medication but no OTC cough medication. Has been treated multiple times for cold like symptoms over the past several weeks. Saw Pulmonologist, chest x-ray negative on 3/26, echo done today no results yet. Albuterol inhaler new but not helping symptoms, having to take every 2-4 hours.  ? ?DIZZINESS ?Duration:  3-4 minutes, happened once this morning  ?Description of symptoms: room spinning ?Duration of episode: minutes ?Dizziness frequency: no history of the same ?Provoking factors: none ?Aggravating factors:  none ?Triggered by rolling over in bed: no ?Triggered by bending over: no ?Aggravated by head movement: no ?Aggravated by exertion, coughing, loud noises: no ?Recent head injury: no ?Recent or current viral symptoms: yes ?History of vasovagal episodes: no ?Nausea: no ?Vomiting: no ?Tinnitus:  chronic  ?Hearing loss: no ?Aural fullness: yes, left ear  ?Headache: yes ?Photophobia/phonophobia: no ?Unsteady gait: no ?Postural instability: no ?Diplopia, dysarthria, dysphagia or weakness: no ?Related to exertion: no ?Pallor: no ?Diaphoresis: yes ?Dyspnea: yes ?Chest pain: no ? ? ? ?Past Medical History:  ?Diagnosis Date  ? Anxiety   ? Arthritis   ? Asthma   ? 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 Cataract   ? right eye but immature  ? Essential hypertension, benign   ? takes Lisinopril-HCTZ daily  ? GERD (gastroesophageal reflux disease)   ? Headache   ? sinus  ? History of colon polyps   ? benign  ? History of migraine   ? History of shingles   ? Hyperlipidemia   ? takes Lipitor daily  ? Low back pain    ? Nocturia   ? S/P insertion of spinal cord stimulator   ? Seasonal allergies   ? takes Singulair daily as needed  ? Type II or unspecified type diabetes mellitus without mention of complication, not stated as uncontrolled   ? Vitamin D deficiency   ? takes Vit D weekly  ? Weakness   ? numbness and tingling left arm  ? Wears dentures   ? full upper and lower  ? ? ?Past Surgical History:  ?Procedure Laterality Date  ? ABDOMINAL HYSTERECTOMY    ? ANTERIOR CERVICAL DECOMP/DISCECTOMY FUSION N/A 02/26/2015  ? Procedure: ANTERIOR CERVICAL DISCECTOMY FUSION C4-5 (1 LEVEL);  Surgeon: Melina Schools, MD;  Location: [place];  Service: Orthopedics;  Laterality: N/A;  ? ANTERIOR LAT LUMBAR FUSION N/A 03/21/2013  ? Procedure: ANTERIOR LATERAL LUMBAR FUSION 1 LEVEL/ XLIF L3-L4 ;  Surgeon: Melina Schools, MD;  Location: [place];  Service: Orthopedics;  Laterality: N/A;  ? APPENDECTOMY    ? AUGMENTATION MAMMAPLASTY Bilateral 1978  ? BACK SURGERY    ? BACK SURGERY    ? Lumbar fusion x 2  ? BREAST EXCISIONAL BIOPSY Right 1970  ? BREAST SURGERY  1990  ? Augementation  ? CARDIAC CATHETERIZATION  5/12  ? ef 55%  ? CARDIAC CATHETERIZATION  10/2010  ? [facility]; EF 55%  ? CARDIAC CATHETERIZATION Left 02/16/2016  ? Procedure: Left Heart Cath and Coronary Angiography;  Surgeon: Yolonda Kida, MD;  Location: [place] [facility];  Service: Cardiovascular;  Laterality: Left;  ? COLONOSCOPY    ? COLONOSCOPY WITH PROPOFOL N/A 04/10/2017  ? Procedure: COLONOSCOPY WITH PROPOFOL;  Surgeon: Lucilla Lame, MD;  Location: [place];  Service: Gastroenterology;  Laterality: N/A;  Diabetic - insulin  ? ESOPHAGOGASTRODUODENOSCOPY    ? JOINT REPLACEMENT    ? KIDNEY SURGERY  1998  ? growth removed from left kidney   ? LEFT HEART CATH AND CORONARY ANGIOGRAPHY Left 09/10/2018  ? Procedure: LEFT HEART CATH AND CORONARY ANGIOGRAPHY;  Surgeon: Yolonda Kida, MD;  Location: [place] [facility];  Service: Cardiovascular;  Laterality: Left;  ? pain stimulator     ? POSTERIOR CERVICAL FUSION/FORAMINOTOMY Right 03/21/2013  ? Procedure: POSTERIOR L2-3 RIGHT FORAMINOTOMY;  Surgeon: Melina Schools, MD;  Location: [place];  Service: Orthopedics;  Laterality: Right;  ? REVISION OF SCAR TISSUE RECTUS MUSCLE    ? SHOULDER ARTHROSCOPY WITH ROTATOR CUFF REPAIR AND SUBACROMIAL DECOMPRESSION Right 09/17/2020  ? Procedure: RIGHT SHOULDER ARTHROSCOPY WITH MINI-OPEN ROTATOR CUFF REPAIR, DISTAL CLAVICLE EXCISION, AND SUBACROMIAL DECOMPRESSION WITH BICEP TENDONESIS;  Surgeon: Thornton Park, MD;  Location: [facility];  Service: Orthopedics;  Laterality: Right;  ? SMALL BOWEL REPAIR    ? SPINAL CORD STIMULATOR BATTERY EXCHANGE N/A 10/17/2012  ? Procedure: SPINAL CORD STIMULATOR BATTERY REMOVAL;  Surgeon: Melina Schools, MD;  Location: [place];  Service: Orthopedics;  Laterality: N/A;  ? SPINAL CORD STIMULATOR BATTERY EXCHANGE N/A 07/22/2015  ? Procedure: REIMPLANTATION OF SPINAL CORD STIMULATOR BATTERY ;  Surgeon: Melina Schools, MD;  Location: [place];  Service: Orthopedics;  Laterality: N/A;  ? TOTAL KNEE ARTHROPLASTY Left   ? TOTAL KNEE ARTHROPLASTY Right 07/28/2017  ? Procedure: RIGHT TOTAL KNEE ARTHROPLASTY;  Surgeon: Sydnee Cabal, MD;  Location: [facility];  Service: Orthopedics;  Laterality: Right;  ? ? ?Family History  ?Problem Relation Age of Onset  ? Heart attack Mother   ? Sarcoidosis Sister   ? ? ?Social History  ? ?Socioeconomic History  ? Marital status: Married  ?  Spouse name: Gwyndolyn Saxon  ? Number of children: 3  ? Years of education: Not on file  ? Highest education level: Associate degree: occupational, Hotel manager, or vocational program  ?Occupational History  ? Not on file  ?Tobacco Use  ? Smoking status: Former  ?  Types: Cigarettes  ?  Quit date: 2006  ?  Years since quitting: 17.2  ? Smokeless tobacco: Never  ? Tobacco comments:  ?  quit smoking 77yrs ago  ?Vaping Use  ? Vaping Use: Never used  ?Substance and Sexual Activity  ? Alcohol use: No  ? Drug use: No  ? Sexual activity: Yes  ?   Partners: Male  ?  Birth control/protection: Surgical  ?Other Topics Concern  ? Not on file  ?Social History Narrative  ? Not on file  ? ?Social Determinants of Health  ? ?Financial Resource Strain: Low Risk   ? Difficulty of Paying Living Expenses: Not hard at all  ?Food Insecurity: No Food Insecurity  ? Worried About Charity fundraiser in the Last Year: Never true  ? Ran Out of Food in the Last Year: Never true  ?Transportation Needs: No Transportation Needs  ? Lack of Transportation (Medical): No  ? Lack of Transportation (Non-Medical): No  ?Physical Activity: Inactive  ? Days of Exercise per Week: 0 days  ? Minutes of Exercise per Session: 0 min  ?Stress: No Stress Concern Present  ? Feeling of Stress : Not at all  ?  Social Connections: Socially Integrated  ? Frequency of Communication with Friends and Family: More than three times a week  ? Frequency of Social Gatherings with Friends and Family: Twice a week  ? Attends Religious Services: More than 4 times per year  ? Active Member of Clubs or Organizations: Yes  ? Attends Archivist Meetings: 1 to 4 times per year  ? Marital Status: Married  ?Intimate Partner Violence: Not At Risk  ? Fear of Current or Ex-Partner: No  ? Emotionally Abused: No  ? Physically Abused: No  ? Sexually Abused: No  ? ? ?Outpatient Medications Prior to Visit  ?Medication Sig Dispense Refill  ? albuterol (VENTOLIN HFA) 108 (90 Base) MCG/ACT inhaler Inhale 2 puffs into the lungs every 6 (six) hours as needed for wheezing or shortness of breath. 18 g 2  ? ascorbic acid (VITAMIN C) 500 MG tablet Take 500 mg by mouth daily.    ? aspirin 81 MG EC tablet Take 1 tablet by mouth daily.    ? azelastine (ASTELIN) 0.1 % nasal spray PLACE 1 SPRAY INTO BOTH NOSTRILS 2 (TWO) TIMES DAILY. USE IN EACH NOSTRIL AS DIRECTED 90 mL 1  ? Blood Glucose Monitoring Suppl (ACCU-CHEK GUIDE ME) w/Device KIT DISPENSE BASED ON PATIENT AND INSURANCE PREFERENCE. USE UP TO FOUR TIMES DAILY AS DIRECTED. (FOR  ICD-9 250.00, 250.01). 1 kit 0  ? citalopram (CELEXA) 40 MG tablet Take 1 tablet (40 mg total) by mouth daily. 90 tablet 1  ? esomeprazole (NEXIUM) 40 MG capsule Take 1 capsule (40 mg total) by mouth

## 2021-11-23 ENCOUNTER — Telehealth: Payer: Self-pay

## 2021-11-23 ENCOUNTER — Institutional Professional Consult (permissible substitution): Payer: Medicare HMO | Admitting: Pulmonary Disease

## 2021-11-23 NOTE — Progress Notes (Signed)
Chronic Care Management  APPOINTMENT REMINDER ? ? ?[name] was reminded to have all medications, supplements and any blood glucose and blood pressure readings available for review with [name], Pharm. D, at her telephone visit on 11/24/2021 at 8:30 am. ? ?Patient Confirm appointment. ? ?Lori Potts ?Clinical Pharmacist Assistant ?[phone]  ? ?

## 2021-11-24 ENCOUNTER — Ambulatory Visit (INDEPENDENT_AMBULATORY_CARE_PROVIDER_SITE_OTHER): Payer: Medicare HMO

## 2021-11-24 ENCOUNTER — Telehealth: Payer: Self-pay

## 2021-11-24 DIAGNOSIS — J4540 Moderate persistent asthma, uncomplicated: Secondary | ICD-10-CM

## 2021-11-24 DIAGNOSIS — E0829 Diabetes mellitus due to underlying condition with other diabetic kidney complication: Secondary | ICD-10-CM

## 2021-11-24 MED ORDER — BUDESONIDE-FORMOTEROL FUMARATE 160-4.5 MCG/ACT IN AERO: 2.0000 | INHALATION_SPRAY | Freq: Two times a day (BID) | RESPIRATORY_TRACT | 3 refills | Status: DC

## 2021-11-24 MED ORDER — TRULICITY 0.75 MG/0.5ML ~~LOC~~ SOAJ: 0.7500 mg | SUBCUTANEOUS | Status: DC

## 2021-11-24 NOTE — Progress Notes (Signed)
? ? ?Chronic Care Management ?Pharmacy Assistant  ? ?Name: Lori Potts  MRN: [id number] DOB: [birth year] ? ?Reason for Encounter: Medication Review/Patient assistance application for Symbicort ?  ?Recent office visits:  ?No recent office visit ? ?Recent consult visits:  ?No recent consult visit ? ?Hospital visits:  ?None in previous 6 months ? ?Medications: ?Outpatient Encounter Medications as of 11/24/2021  ?Medication Sig  ? albuterol (VENTOLIN HFA) 108 (90 Base) MCG/ACT inhaler Inhale 2 puffs into the lungs every 6 (six) hours as needed for wheezing or shortness of breath.  ? ascorbic acid (VITAMIN C) 500 MG tablet Take 500 mg by mouth daily.  ? aspirin 81 MG EC tablet Take 1 tablet by mouth daily.  ? azelastine (ASTELIN) 0.1 % nasal spray PLACE 1 SPRAY INTO BOTH NOSTRILS 2 (TWO) TIMES DAILY. USE IN EACH NOSTRIL AS DIRECTED  ? Blood Glucose Monitoring Suppl (ACCU-CHEK GUIDE ME) w/Device KIT DISPENSE BASED ON PATIENT AND INSURANCE PREFERENCE. USE UP TO FOUR TIMES DAILY AS DIRECTED. (FOR ICD-9 250.00, 250.01).  ? budesonide-formoterol (SYMBICORT) 160-4.5 MCG/ACT inhaler Inhale 2 puffs into the lungs 2 (two) times daily.  ? citalopram (CELEXA) 40 MG tablet Take 1 tablet (40 mg total) by mouth daily.  ? Dulaglutide (TRULICITY) 2.54 YH/0.6CB SOPN Inject 0.75 mg into the skin once a week. Patient receives via [employer] Patient Assistance through Dec 2023  ? esomeprazole (NEXIUM) 40 MG capsule Take 1 capsule (40 mg total) by mouth daily. Follow up in office for more refills  ? fluticasone (FLONASE) 50 MCG/ACT nasal spray PLACE 2 SPRAYS INTO BOTH NOSTRILS EVERY EVENING.  ? Galcanezumab-gnlm (EMGALITY) 120 MG/ML SOAJ Inject 120 mg into the skin every 30 (thirty) days.  ? glipiZIDE (GLUCOTROL XL) 2.5 MG 24 hr tablet TAKE 1 TABLET BY MOUTH DAILY WITH BREAKFAST.  ? levocetirizine (XYZAL) 5 MG tablet Take 1 tablet (5 mg total) by mouth every evening.  ? losartan (COZAAR) 25 MG tablet TAKE 1 TABLET EVERY DAY (Patient taking  differently: Take 25 mg by mouth daily.)  ? methocarbamol (ROBAXIN) 500 MG tablet Take 500 mg by mouth every 8 (eight) hours as needed for muscle spasms.  ? methylPREDNISolone (MEDROL DOSEPAK) 4 MG TBPK tablet Day 1: Take 8 mg (2 tablets) before breakfast, 4 mg (1 tablet) after lunch, 4 mg (1 tablet) after supper, and 8 mg (2 tablets) at bedtime. Day 2:Take 4 mg (1 tablet) before breakfast, 4 mg (1 tablet) after lunch, 4 mg (1 tablet) after supper, and 8 mg (2 tablets) at bedtime. Day 3: Take 4 mg (1 tablet) before breakfast, 4 mg (1 tablet) after lunch, 4 mg (1 tablet) after supper, and 4 mg (1 tablet) at bedtime. Day 4: Take 4 mg (1 tablet) before breakfast, 4 mg (1 tablet) after lunch, and 4 mg (1 tablet) at bedtime. Day 5: Take 4 mg (1 tablet) before breakfast and 4 mg (1 tablet) at bedtime. Day 6: Take 4 mg (1 tablet) before breakfast.  ? montelukast (SINGULAIR) 10 MG tablet TAKE 1 TABLET BY MOUTH EVERYDAY AT BEDTIME  ? Multiple Vitamins-Minerals (MULTIVITAMIN WITH MINERALS) tablet Take 1 tablet by mouth daily.  ? Oxycodone HCl 10 MG TABS   ? promethazine-dextromethorphan (PROMETHAZINE-DM) 6.25-15 MG/5ML syrup Take 5 mLs by mouth 4 (four) times daily as needed for cough.  ? propranolol (INDERAL) 10 MG tablet TAKE 1 TABLET BY MOUTH THREE TIMES A DAY  ? rosuvastatin (CRESTOR) 40 MG tablet TAKE 1 TABLET BY MOUTH EVERYDAY AT BEDTIME  ? sodium  chloride (OCEAN) 0.65 % SOLN nasal spray Place 1 spray into both nostrils 2 (two) times daily as needed for congestion.  ? ?No facility-administered encounter medications on file as of 11/24/2021.  ? ? ?Care Gaps: ?Shingrix Vaccine ?Opthalmology exam  ? ?  ?Star Rating Drugs: ?Rosuvastatin 40 mg last filled 09/25/2021 [facility] ?Losartan 25 mg last filled 09/15/2020 for [facility] day [facility] ?Trulicity 3.01 mg last filled 11/30/2020 for [facility] day supply at [facility] (receives from [employer]) ?  ?Medication Fill Gaps: ?Citalopram 20 MG last  filled 11/22/2020 90 day supply ? ?I received a task from Junius Argyle, CPP requesting that I start the application for patient assistance on the medication Symbicort.  ?  ?I reach out to patient to get her verbal consent to complete application online. ? ?Patient gave verbal consent to complete application online via telephone call.Patient was approved to receive patient assistance for Symbicort on 11/24/2021 with a end date of 08/21/2022. ? ?Patient was inform per clinical pharmacist, once she receives shipment of Symbicort she will use 2 puffs twice daily. ? ?Patient Verbalized understanding, and she is aware if she does not receive her shipment by 12/03/2021 to return my call. ? ?Inform Clinical pharmacist of approval and for him to send the prescription into [facility] as I will Follow up with [facility] to confirm the prescription was submitted.  ? ?Anderson Malta ?Clinical Pharmacist Assistant ?[phone]  ? ? ? ?

## 2021-11-24 NOTE — Progress Notes (Signed)
? ?Chronic Care Management ?Pharmacy Note ? ?11/24/2021 ?Name:  RHYSE SKOWRON MRN:  [id number] DOB:  1945-10-14 ? ?Summary: ?Patient presents for CCM follow-up.  ?   ?-Patient Denies consistent use of maintenance inhaler; has not refilled Trelegy due to cost. Frequency of rescue inhaler use: 2-3 times daily. Given frequency of rescue inhaler use, will try to get patient qualified for maintenance inhaler with high dose corticosteroid.  ? ?-Patient assistance for Trulicity approved, patient to receive shipment soon.  ? ?Recommendations/Changes made from today's visit: ? ?-START Symbicort 160-4.5 mcg/act 2 puffs twice daily once approved with PAP.  ? ?-START Trulicity 0.75 mg weekly once patient finishes supply of ozempic ? ?-STOP glipizide and STOP Ozempic ? ?Plan: ?CPP follow-up in 3 months ? ?Subjective: ?ANAHIS FURGESON is an 76 y.o. year old female who is a primary patient of [name], [name].  The CCM team was consulted for assistance with disease management and care coordination needs.   ? ?Engaged with patient by telephone for follow up visit in response to provider referral for pharmacy case management and/or care coordination services.  ? ?Consent to Services:  ?The patient was given information about Chronic Care Management services, agreed to services, and gave verbal consent prior to initiation of services.  Please see initial visit note for detailed documentation.  ? ?Patient Care Team: ?[name], [name] as PCP - General (Nurse Practitioner) ?Gaspar Cola, [facility] (Pharmacist) ?Sheran Luz, MD as Consulting Physician (Physical Medicine and Rehabilitation) ?Salena Saner, MD as Consulting Physician (Pulmonary Disease) ?Lyndle Herrlich, MD as Consulting Physician (Orthopedic Surgery) ? ?Recent office visits: ?11/22/21: Patient presented to Dr. Caralee Ates for acute labyrinthitis. Methylprednisolone x 7 days.  ?10/20/21: Patient presented to [name], [name] for viral URI.  ?09/13/21: Patient  presented to [name] for asthma. Trelegy 1 puff daily.  ?08/13/22: Patient presented to [name] for anxiety. Citalopram 40 mg dai ?Recent consult visits: ?None in past 6 months. ? ?Hospital visits: ?None in previous 6 months ? ? ?Objective: ? ?Lab Results  ?Component Value Date  ? CREATININE 0.81 07/08/2021  ? BUN 18 07/08/2021  ? GFRNONAA >60 09/14/2020  ? GFRAA 101 02/18/2020  ? NA 143 07/08/2021  ? K 4.1 07/08/2021  ? CALCIUM 10.1 07/08/2021  ? CO2 28 07/08/2021  ? GLUCOSE 103 (H) 07/08/2021  ? ? ?Lab Results  ?Component Value Date/Time  ? HGBA1C 6.9 (H) 07/08/2021 03:15 PM  ? HGBA1C 7.2 (A) 11/25/2020 08:04 AM  ? HGBA1C 7.2 (H) 11/14/2019 12:06 PM  ? HGBA1C 6.6 03/31/2019 12:00 AM  ? MICROALBUR 5.2 05/14/2019 12:00 AM  ? MICROALBUR 3.8 05/09/2018 09:50 AM  ? MICROALBUR 20 07/04/2016 10:36 AM  ?  ?Last diabetic Eye exam:  ?Lab Results  ?Component Value Date/Time  ? HMDIABEYEEXA No Retinopathy 06/02/2020 12:00 AM  ?  ?Last diabetic Foot exam: No results found for: HMDIABFOOTEX  ? ?Lab Results  ?Component Value Date  ? CHOL 163 07/08/2021  ? HDL 53 07/08/2021  ? LDLCALC 90 07/08/2021  ? TRIG 102 07/08/2021  ? CHOLHDL 3.1 07/08/2021  ? ? ? ?  Latest Ref Rng & Units 07/08/2021  ?  3:15 PM 11/25/2020  ?  8:39 AM 02/18/2020  ?  9:37 AM  ?Hepatic Function  ?Total Protein 6.1 - 8.1 g/dL 7.3   7.2   6.6    ?AST 10 - 35 U/L 23   19   17     ?ALT 6 - 29 U/L  14   17   16     ?Total Bilirubin 0.2 - 1.2 mg/dL 0.4   0.4   0.5    ? ? ?Lab Results  ?Component Value Date/Time  ? TSH 2.00 02/18/2020 09:37 AM  ? TSH 0.71 11/14/2019 12:06 PM  ? FREET4 1.1 02/18/2020 09:37 AM  ? ? ? ?  Latest Ref Rng & Units 07/08/2021  ?  3:15 PM 01/19/2021  ? 12:00 AM 09/14/2020  ?  9:37 AM  ?CBC  ?WBC 3.8 - 10.8 Thousand/uL 4.9   5.9   5.3    ?Hemoglobin 11.7 - 15.5 g/dL 09/16/2020   89.2   11.9    ?Hematocrit 35.0 - 45.0 % 38.5   36.3   39.3    ?Platelets 140 - 400 Thousand/uL 441   531   414    ? ? ?Lab Results  ?Component Value Date/Time  ? VD25OH  24 (L) 05/09/2018 09:50 AM  ? VD25OH 25 (L) 09/11/2017 02:09 PM  ? ? ?Clinical ASCVD: No  ?The 10-year ASCVD risk score (Arnett DK, et al., 2019) is: 31.5% ?  Values used to calculate the score: ?    Age: 76 years ?    Sex: Female ?    Is Non-Hispanic African American: Yes ?    Diabetic: Yes ?    Tobacco smoker: No ?    Systolic Blood Pressure: 142 mmHg ?    Is BP treated: Yes ?    HDL Cholesterol: 53 mg/dL ?    Total Cholesterol: 163 mg/dL   ? ? ?  11/22/2021  ?  2:51 PM 11/18/2021  ?  2:37 PM 11/15/2021  ?  9:28 AM  ?Depression screen PHQ 2/9  ?Decreased Interest 0 0 0  ?Down, Depressed, Hopeless 0 0 0  ?PHQ - 2 Score 0 0 0  ?Altered sleeping 0    ?Tired, decreased energy 0    ?Change in appetite 0    ?Feeling bad or failure about yourself  0    ?Trouble concentrating 0    ?Moving slowly or fidgety/restless 0    ?Suicidal thoughts 0    ?PHQ-9 Score 0    ?Difficult doing work/chores Not difficult at all    ?  ? ?Social History  ? ?Tobacco Use  ?Smoking Status Former  ? Types: Cigarettes  ? Quit date: 2006  ? Years since quitting: 17.2  ?Smokeless Tobacco Never  ?Tobacco Comments  ? quit smoking 68yrs ago  ? ?BP Readings from Last 3 Encounters:  ?11/22/21 (!) 142/80  ?11/11/21 126/80  ?07/08/21 128/76  ? ?Pulse Readings from Last 3 Encounters:  ?11/22/21 73  ?11/11/21 65  ?07/08/21 82  ? ?Wt Readings from Last 3 Encounters:  ?11/22/21 188 lb (85.3 kg)  ?11/11/21 186 lb 12.8 oz (84.7 kg)  ?07/08/21 183 lb 3.2 oz (83.1 kg)  ? ?BMI Readings from Last 3 Encounters:  ?11/22/21 32.27 kg/m?  ?11/11/21 32.57 kg/m?  ?07/08/21 31.45 kg/m?  ? ? ?Assessment/Interventions: Review of patient past medical history, allergies, medications, health status, including review of consultants reports, laboratory and other test data, was performed as part of comprehensive evaluation and provision of chronic care management services.  ? ?SDOH:  (Social Determinants of Health) assessments and interventions performed: Yes ?SDOH Interventions    ? ?Flowsheet Row Most Recent Value  ?SDOH Interventions   ?Financial Strain Interventions Other (Comment)  [PAPs]  ? ?  ? ? ?SDOH Screenings  ? ?Alcohol Screen: Low Risk   ?  Last Alcohol Screening Score (AUDIT): 0  ?Depression (PHQ2-9): Low Risk   ? PHQ-2 Score: 0  ?Financial Resource Strain: Medium Risk  ? Difficulty of Paying Living Expenses: Somewhat hard  ?Food Insecurity: No Food Insecurity  ? Worried About Programme researcher, broadcasting/film/videounning Out of Food in the Last Year: Never true  ? Ran Out of Food in the Last Year: Never true  ?Housing: Low Risk   ? Last Housing Risk Score: 0  ?Physical Activity: Inactive  ? Days of Exercise per Week: 0 days  ? Minutes of Exercise per Session: 0 min  ?Social Connections: Socially Integrated  ? Frequency of Communication with Friends and Family: More than three times a week  ? Frequency of Social Gatherings with Friends and Family: Twice a week  ? Attends Religious Services: More than 4 times per year  ? Active Member of Clubs or Organizations: Yes  ? Attends BankerClub or Organization Meetings: 1 to 4 times per year  ? Marital Status: Married  ?Stress: No Stress Concern Present  ? Feeling of Stress : Not at all  ?Tobacco Use: Medium Risk  ? Smoking Tobacco Use: Former  ? Smokeless Tobacco Use: Never  ? Passive Exposure: Not on file  ?Transportation Needs: No Transportation Needs  ? Lack of Transportation (Medical): No  ? Lack of Transportation (Non-Medical): No  ? ? ?CCM Care Plan ? ?Allergies  ?Allergen Reactions  ? Metformin And Related Diarrhea  ? Adhesive [Tape] Rash and Other (See Comments)  ?  Regular tape is ok, allergy is to paper tape  ? ? ?Medications Reviewed Today   ? ? Reviewed by Margarita MailAndrews, Elisabeth, DO (Physician) on 11/22/21 at 1539  Med List Status: <None>  ? ?Medication Order Taking? Sig Documenting Provider Last Dose Status Informant  ?albuterol (VENTOLIN HFA) 108 (90 Base) MCG/ACT inhaler [id number] Yes Inhale 2 puffs into the lungs every 6 (six) hours as needed for wheezing or shortness  of breath. Salena SanerGonzalez, Carmen L, MD Taking Active   ?ascorbic acid (VITAMIN C) 500 MG tablet [id number] Yes Take 500 mg by mouth daily. [provider] Taking Active Self  ?aspirin 81 MG EC tablet [id number]

## 2021-11-24 NOTE — Patient Instructions (Signed)
Visit Information ?It was great speaking with you today!  Please let me know if you have any questions about our visit. ? ? Goals Addressed   ? ?  ?  ?  ?  ? This Visit's Progress  ?  Monitor and Manage My Blood Sugar-Diabetes Type 2   On track  ?  Timeframe:  Long-Range Goal ?Priority:  High ?Start Date: 03/18/21                            ?Expected End Date: 03/18/22                     ? ?Follow Up within 90 days ?  ?- check blood sugar at prescribed times ?- check blood sugar if I feel it is too high or too low ?- enter blood sugar readings and medication or insulin into daily log  ?  ?Why is this important?   ?Checking your blood sugar at home helps to keep it from getting very high or very low.  ?Writing the results in a diary or log helps the doctor know how to care for you.  ?Your blood sugar log should have the time, date and the results.  ?Also, write down the amount of insulin or other medicine that you take.  ?Other information, like what you ate, exercise done and how you were feeling, will also be helpful.   ?  ?Notes:  ?  ? ?  ? ? ?Patient Care Plan: General Pharmacy (Adult)  ?  ? ?Problem Identified: Hypertension, Hyperlipidemia, Diabetes, Coronary Artery Disease, GERD, and Allergic Rhinitis   ?Priority: High  ?  ? ?Long-Range Goal: Patient-Specific Goal   ?Start Date: 03/18/2021  ?Expected End Date: 03/18/2022  ?This Visit's Progress: On track  ?Recent Progress: On track  ?Priority: High  ?Note:   ?Current Barriers:  ?Unable to independently afford treatment regimen ?Unable to achieve control of cholesterol  ? ?Pharmacist Clinical Goal(s):  ?Patient will achieve control of cholesterol as evidenced by LDL less than 100 ?maintain control of diabetes as evidenced by A1c less than 8%  through collaboration with PharmD and provider.  ? ?Interventions: ?1:1 collaboration with Danelle Berry, PA-C regarding development and update of comprehensive plan of care as evidenced by provider attestation and  co-signature ?Inter-disciplinary care team collaboration (see longitudinal plan of care) ?Comprehensive medication review performed; medication list updated in electronic medical record ? ?Hypertension (BP goal <140/90) ?-Controlled ?-Current treatment: ?Losartan 25 mg daily  ?Propanolol 10 mg three times daily  ?-Medications previously tried: NA  ?-Current home readings: NA ?-Denies hypotensive/hypertensive symptoms ?-Counseled to monitor BP at home weekly, document, and provide log at future appointments ?-Recommended to continue current medication ? ?Hyperlipidemia: (LDL goal < 100) ?-Uncontrolled ?-Current treatment: ?Rosuvastatin 40 mg daily  ?-Medications previously tried: NA  ? ?Diabetes (A1c goal <8%) ?-Controlled ?-Current medications: ?Glipizide XL 2.5 mg daily: Appropriate, Effective, Safe, Accessible ?Ozempic 0.25 mg weekly: Appropriate, Effective, Safe, Accessible  ?-Medications previously tried: NA  ?-Current home glucose readings ?fasting glucose: 120-132 ?-Reports hypoglycemic symptoms: NA  ?-Patient assistance for Trulicity approved, patient to receive shipment soon.  ?-START Trulicity 0.75 mg weekly once patient finishes supply of ozempic,  ?-STOP glipizide and STOP Ozempic ? ?Asthma with Allergic Rhinitis (Goal: control symptoms and prevent exacerbations) ?-Uncontrolled ?-Current treatment  ?Albuterol HFA 1 puff every 6 hours as needed   ?Montelukast 10 mg nightly  ?-Current treatment  ?Azelastine nasal  spray twice daily  ?Xyzal 5 mg nightly  ?Ocean nasal spray  ?-Medications previously tried: Engineer, materials (ineffective), trelegy (cost)   ?-Exacerbations requiring treatment in last 6 months: No ?-Patient Denies consistent use of maintenance inhaler; has not refilled Trelegy due to cost.  ?-Frequency of rescue inhaler use: 2-3 times daily  ?-given frequency of rescue inhaler use, will try to get patient qualified for maintenance inhaler with high dose corticosteroid.  ?-START Symbicort 160-4.5 mcg/act 2  puffs twice daily once approved with PAP.  ? ?Patient Goals/Self-Care Activities ?Patient will:  ?- check glucose daily before breakfast, document, and provide at future appointments ?check blood pressure weekly, document, and provide at future appointments ? ?Follow Up Plan: Telephone follow up appointment with care management team member scheduled for:  02/16/2022 at 8:30 AM ?  ? ? ? ?Patient agreed to services and verbal consent obtained.  ? ?Patient verbalizes understanding of instructions and care plan provided today and agrees to view in MyChart. Active MyChart status confirmed with patient.   ? ?Garey Ham, BCACP, CPP ?Clinical Pharmacist Practitioner  ?[facility] ?[phone]  ?

## 2021-11-24 NOTE — Addendum Note (Signed)
Addended by: Daron Offer A on: 11/24/2021 12:31 PM ? ? Modules accepted: Orders ? ?

## 2021-11-29 ENCOUNTER — Telehealth: Payer: Self-pay

## 2021-11-29 NOTE — Progress Notes (Signed)
I reach out to [employer] to confirm they have received the prescription for Symbicort so they can submit the order.  ? ?Per [facility], they have received the  prescription for Symbicort, and she submitted it.Patient should receive her shipment for Symbicort in 7 -10 business days at [address] [place] [address]. ? ?Patient verbalized understanding. ? ?Bessie Kellihan,CPA ?Clinical Pharmacist Assistant ?[phone]  ? ?

## 2021-12-05 ENCOUNTER — Other Ambulatory Visit: Payer: Self-pay | Admitting: Family Medicine

## 2021-12-05 DIAGNOSIS — R809 Proteinuria, unspecified: Secondary | ICD-10-CM

## 2021-12-08 ENCOUNTER — Telehealth: Payer: Self-pay | Admitting: Emergency Medicine

## 2021-12-08 NOTE — Telephone Encounter (Signed)
Congested, SOB since last visit. ?Offered appointment she stated she will wait on you Wednesday ?

## 2021-12-13 ENCOUNTER — Other Ambulatory Visit: Payer: Self-pay | Admitting: Family Medicine

## 2021-12-13 DIAGNOSIS — R809 Proteinuria, unspecified: Secondary | ICD-10-CM

## 2021-12-13 NOTE — Telephone Encounter (Signed)
Spoke with pt and she was told to stop taking this medication she is now on trulicity and she picked that up last week.

## 2021-12-17 ENCOUNTER — Encounter: Payer: Self-pay | Admitting: Nurse Practitioner

## 2021-12-17 ENCOUNTER — Other Ambulatory Visit: Payer: Self-pay

## 2021-12-17 ENCOUNTER — Telehealth (INDEPENDENT_AMBULATORY_CARE_PROVIDER_SITE_OTHER): Payer: Medicare HMO | Admitting: Nurse Practitioner

## 2021-12-17 DIAGNOSIS — R053 Chronic cough: Secondary | ICD-10-CM

## 2021-12-17 DIAGNOSIS — I10 Essential (primary) hypertension: Secondary | ICD-10-CM

## 2021-12-17 DIAGNOSIS — J0111 Acute recurrent frontal sinusitis: Secondary | ICD-10-CM | POA: Diagnosis not present

## 2021-12-17 MED ORDER — AZITHROMYCIN 250 MG PO TABS: ORAL_TABLET | ORAL | 0 refills | Status: AC

## 2021-12-17 MED ORDER — PROMETHAZINE-DM 6.25-15 MG/5ML PO SYRP: 5.0000 mL | ORAL_SOLUTION | Freq: Four times a day (QID) | ORAL | 0 refills | Status: DC | PRN

## 2021-12-17 MED ORDER — LOSARTAN POTASSIUM 25 MG PO TABS: 25.0000 mg | ORAL_TABLET | Freq: Every day | ORAL | 3 refills | Status: DC

## 2021-12-17 NOTE — Progress Notes (Signed)
? ?Name: Lori Potts   MRN: [id number]    DOB: [birth year]   Date:12/17/2021 ? ?     Progress Note ? ?Subjective ? ?Chief Complaint ? ?Chief Complaint  ?Patient presents with  ? Cough  ?  And congestion on and off for months  ? ? ?I connected with  [name]  on 12/17/21 at 9:30 by a video enabled telemedicine application and verified that I am speaking with the correct person using two identifiers.  I discussed the limitations of evaluation and management by telemedicine and the availability of in person appointments. The patient expressed understanding and agreed to proceed with a virtual visit  Staff also discussed with the patient that there may be a patient responsible charge related to this service. ?Patient Location: home ?Provider Location: [facility] ?Additional Individuals present: alone ? ?HPI ? ?Persistent cough/ sinus infection: Patient has been seen multiple times for congestion and cough.  She saw pulmonology on 11/11/2021 Dr. Patsey Berthold.  She is scheduled for pulmonary function test next week.  She recently had a chest x-ray which was negative.  She has been using her albuterol inhaler as needed and Symbicort 2 times a day.  She also says she has been having issues with her sinuses for several weeks.  She has been taking her allergy medication as prescribed.  She says she feels a lot of pressure in her face.  Due to the length of time that she has been having the symptoms we will give her a course of antibiotics.  She recently had a course of steroids, not recommended to do another course at this time. ? ?HTN: She says she has been she is about to run out of her blood pressure medicine.  We will send in her losartan 25 mg daily.  She denies any chest pain, shortness of breath, headaches, or blurred vision.  She does not check her blood pressure at home. ? ? ?Patient Active Problem List  ? Diagnosis Date Noted  ? Neutropenia, unspecified type ([place]) 11/25/2020  ? Intractable migraine with aura without  status migrainosus 04/18/2020  ? Body mass index (BMI) 31.0-31.9, adult 02/06/2020  ? Chronic left hip pain 12/26/2019  ? Anemia 09/22/2017  ? S/P knee replacement 07/28/2017  ? Allergic rhinitis 05/26/2017  ? Vitamin D deficiency 05/19/2017  ? Chronic nonintractable headache 05/18/2017  ? Advanced care planning/counseling discussion   ? Gastroesophageal reflux disease   ? 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 Insomnia 11/29/2016  ? Chronic right-sided low back pain with right-sided sciatica 04/19/2016  ? Abdominal wall pain in right flank 03/29/2016  ? Diverticulosis 02/24/2016  ? Hypercholesterolemia 06/16/2015  ? Chronic pain 04/13/2015  ? Neck pain 02/26/2015  ? Status post lumbar surgery 02/26/2015  ? Abnormal ECG 01/27/2015  ? Angina pectoris ([place]) 01/27/2015  ? 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 Essential (primary) hypertension 01/27/2015  ? ? ?Social History  ? ?Tobacco Use  ? Smoking status: Former  ?  Types: Cigarettes  ?  Quit date: 2006  ?  Years since quitting: 17.3  ? Smokeless tobacco: Never  ? Tobacco comments:  ?  quit smoking 69yr ago  ?Substance Use Topics  ? Alcohol use: No  ? ? ? ?Current Outpatient Medications:  ?  albuterol (VENTOLIN HFA) 108 (90 Base) MCG/ACT inhaler, Inhale 2 puffs into the lungs every 6 (six) hours as needed for wheezing or shortness of breath., Disp: 18 g, Rfl: 2 ?  ascorbic acid (VITAMIN C) 500 MG tablet, Take 500 mg by mouth daily., Disp: , Rfl:  ?  aspirin 81 MG EC tablet, Take 1 tablet by mouth daily., Disp: , Rfl:  ?  azelastine (ASTELIN) 0.1 % nasal spray, PLACE 1 SPRAY INTO BOTH NOSTRILS 2 (TWO) TIMES DAILY. USE IN EACH NOSTRIL AS DIRECTED, Disp: 90 mL, Rfl: 1 ?  Blood Glucose Monitoring Suppl (ACCU-CHEK GUIDE ME) w/Device KIT, DISPENSE BASED ON PATIENT AND INSURANCE PREFERENCE. USE UP TO FOUR TIMES DAILY AS DIRECTED. (FOR ICD-9 250.00, 250.01)., Disp: 1 kit, Rfl: 0 ?  budesonide-formoterol (SYMBICORT)  160-4.5 MCG/ACT inhaler, Inhale 2 puffs into the lungs 2 (two) times daily. Patient receives via [employer] patient assistance program, Disp: 3 each, Rfl: 3 ?  citalopram (CELEXA) 40 MG tablet, Take 1 tablet (40 mg total) by mouth daily., Disp: 90 tablet, Rfl: 1 ?  Dulaglutide (TRULICITY) 4.56 YB/6.3SL SOPN, Inject 0.75 mg into the skin once a week. Patient receives via [employer] Patient Assistance through Dec 2023, Disp: , Rfl:  ?  esomeprazole (NEXIUM) 40 MG capsule, Take 1 capsule (40 mg total) by mouth daily. Follow up in office for more refills, Disp: 90 capsule, Rfl: 1 ?  fluticasone (FLONASE) 50 MCG/ACT nasal spray, PLACE 2 SPRAYS INTO BOTH NOSTRILS EVERY EVENING., Disp: 48 mL, Rfl: 1 ?  Galcanezumab-gnlm (EMGALITY) 120 MG/ML SOAJ, Inject 120 mg into the skin every 30 (thirty) days., Disp: 1.12 mL, Rfl: 3 ?  glipiZIDE (GLUCOTROL XL) 2.5 MG 24 hr tablet, TAKE 1 TABLET BY MOUTH EVERY DAY WITH BREAKFAST, Disp: 30 tablet, Rfl: 0 ?  levocetirizine (XYZAL) 5 MG tablet, Take 1 tablet (5 mg total) by mouth every evening., Disp: 30 tablet, Rfl: 2 ?  losartan (COZAAR) 25 MG tablet, TAKE 1 TABLET EVERY DAY (Patient taking differently: Take 25 mg by mouth daily.), Disp: 90 tablet, Rfl: 3 ?  methocarbamol (ROBAXIN) 500 MG tablet, Take 500 mg by mouth every 8 (eight) hours as needed for muscle spasms., Disp: , Rfl:  ?  methylPREDNISolone (MEDROL DOSEPAK) 4 MG TBPK tablet, Day 1: Take 8 mg (2 tablets) before breakfast, 4 mg (1 tablet) after lunch, 4 mg (1 tablet) after supper, and 8 mg (2 tablets) at bedtime. Day 2:Take 4 mg (1 tablet) before breakfast, 4 mg (1 tablet) after lunch, 4 mg (1 tablet) after supper, and 8 mg (2 tablets) at bedtime. Day 3: Take 4 mg (1 tablet) before breakfast, 4 mg (1 tablet) after lunch, 4 mg (1 tablet) after supper, and 4 mg (1 tablet) at bedtime. Day 4: Take 4 mg (1 tablet) before breakfast, 4 mg (1 tablet) after lunch, and 4 mg (1 tablet) at bedtime. Day 5: Take 4 mg (1 tablet) before breakfast  and 4 mg (1 tablet) at bedtime. Day 6: Take 4 mg (1 tablet) before breakfast., Disp: 1 each, Rfl: 0 ?  montelukast (SINGULAIR) 10 MG tablet, TAKE 1 TABLET BY MOUTH EVERYDAY AT BEDTIME, Disp: 90 tablet, Rfl: 3 ?  Multiple Vitamins-Minerals (MULTIVITAMIN WITH MINERALS) tablet, Take 1 tablet by mouth daily., Disp: , Rfl:  ?  Oxycodone HCl 10 MG TABS, , Disp: , Rfl:  ?  promethazine-dextromethorphan (PROMETHAZINE-DM) 6.25-15 MG/5ML syrup, Take 5 mLs by mouth 4 (four) times daily as needed for cough., Disp: 118 mL, Rfl: 0 ?  propranolol (INDERAL) 10 MG tablet, TAKE 1 TABLET BY MOUTH THREE TIMES A DAY, Disp: 270 tablet, Rfl: 3 ?  rosuvastatin (CRESTOR) 40 MG tablet, TAKE 1 TABLET BY MOUTH  EVERYDAY AT BEDTIME, Disp: 90 tablet, Rfl: 0 ?  sodium chloride (OCEAN) 0.65 % SOLN nasal spray, Place 1 spray into both nostrils 2 (two) times daily as needed for congestion., Disp: 60 mL, Rfl: 5 ? ?Allergies  ?Allergen Reactions  ? Metformin And Related Diarrhea  ? Adhesive [Tape] Rash and Other (See Comments)  ?  Regular tape is ok, allergy is to paper tape  ? ? ?I personally reviewed active problem list, medication list, allergies, notes from last encounter with the patient/caregiver today. ? ?ROS ? ?Constitutional: Negative for fever or weight change.  ?HEENT: nasal drainage, congestion, facial pressure ?Respiratory: positive for cough and negative for shortness of breath.   ?Cardiovascular: Negative for chest pain or palpitations.  ?Gastrointestinal: Negative for abdominal pain, no bowel changes.  ?Musculoskeletal: Negative for gait problem or joint swelling.  ?Skin: Negative for rash.  ?Neurological: Negative for dizziness or headache.  ?No other specific complaints in a complete review of systems (except as listed in HPI above).  ? ?Objective ? ?Virtual encounter, vitals not obtained. ? ?There is no height or weight on file to calculate BMI. ? ?Nursing Note and Vital Signs reviewed. ? ?Physical Exam ? ?Awake, alert, oriented x3,  speaking complete sentences ? ?No results found for this or any previous visit (from the past 72 hour(s)). ? ?Assessment & Plan ? ?1. Acute recurrent frontal sinusitis ?Continue to take your allergy medica

## 2021-12-20 DIAGNOSIS — Z5181 Encounter for therapeutic drug level monitoring: Secondary | ICD-10-CM | POA: Diagnosis not present

## 2021-12-20 DIAGNOSIS — G894 Chronic pain syndrome: Secondary | ICD-10-CM | POA: Diagnosis not present

## 2021-12-20 DIAGNOSIS — Z79899 Other long term (current) drug therapy: Secondary | ICD-10-CM | POA: Diagnosis not present

## 2021-12-22 ENCOUNTER — Ambulatory Visit: Payer: Medicare HMO | Attending: Pulmonary Disease

## 2021-12-22 DIAGNOSIS — R0602 Shortness of breath: Secondary | ICD-10-CM | POA: Diagnosis not present

## 2021-12-22 DIAGNOSIS — J984 Other disorders of lung: Secondary | ICD-10-CM | POA: Insufficient documentation

## 2021-12-22 LAB — PULMONARY FUNCTION TEST ARMC ONLY
DL/VA % pred: 88 %
DL/VA: 3.65 ml/min/mmHg/L
DLCO unc % pred: 72 %
DLCO unc: 13.82 ml/min/mmHg
FEF 25-75 Post: 1.34 L/sec
FEF 25-75 Pre: 0.76 L/sec
FEF2575-%Change-Post: 76 %
FEF2575-%Pred-Post: 89 %
FEF2575-%Pred-Pre: 50 %
FEV1-%Change-Post: 30 %
FEV1-%Pred-Post: 65 %
FEV1-%Pred-Pre: 49 %
FEV1-Post: 1.11 L
FEV1-Pre: 0.85 L
FEV1FVC-%Change-Post: -5 %
FEV1FVC-%Pred-Pre: 101 %
FEV6-%Change-Post: 38 %
FEV6-%Pred-Post: 71 %
FEV6-%Pred-Pre: 51 %
FEV6-Post: 1.51 L
FEV6-Pre: 1.09 L
FEV6FVC-%Pred-Post: 104 %
FEV6FVC-%Pred-Pre: 104 %
FVC-%Change-Post: 38 %
FVC-%Pred-Post: 68 %
FVC-%Pred-Pre: 49 %
FVC-Post: 1.51 L
FVC-Pre: 1.09 L
Post FEV1/FVC ratio: 73 %
Post FEV6/FVC ratio: 100 %
Pre FEV1/FVC ratio: 78 %
Pre FEV6/FVC Ratio: 100 %
RV % pred: 90 %
RV: 2.06 L
TLC % pred: 80 %
TLC: 4.09 L

## 2021-12-22 MED ORDER — ALBUTEROL SULFATE (2.5 MG/3ML) 0.083% IN NEBU
2.5000 mg | INHALATION_SOLUTION | Freq: Once | RESPIRATORY_TRACT | Status: AC
  Administered 2021-12-22: 2.5 mg via RESPIRATORY_TRACT
  Filled 2021-12-22: qty 3

## 2021-12-23 DIAGNOSIS — M25552 Pain in left hip: Secondary | ICD-10-CM | POA: Diagnosis not present

## 2021-12-23 DIAGNOSIS — M1612 Unilateral primary osteoarthritis, left hip: Secondary | ICD-10-CM | POA: Diagnosis not present

## 2021-12-27 ENCOUNTER — Telehealth: Payer: Self-pay | Admitting: Nurse Practitioner

## 2021-12-27 ENCOUNTER — Other Ambulatory Visit: Payer: Self-pay

## 2021-12-27 DIAGNOSIS — E7800 Pure hypercholesterolemia, unspecified: Secondary | ICD-10-CM

## 2021-12-27 DIAGNOSIS — R0602 Shortness of breath: Secondary | ICD-10-CM

## 2021-12-27 MED ORDER — TRELEGY ELLIPTA 100-62.5-25 MCG/ACT IN AEPB: 1.0000 | INHALATION_SPRAY | Freq: Every day | RESPIRATORY_TRACT | 11 refills | Status: DC

## 2021-12-28 ENCOUNTER — Other Ambulatory Visit: Payer: Self-pay

## 2021-12-28 DIAGNOSIS — E7800 Pure hypercholesterolemia, unspecified: Secondary | ICD-10-CM

## 2021-12-28 MED ORDER — ROSUVASTATIN CALCIUM 40 MG PO TABS: ORAL_TABLET | ORAL | 1 refills | Status: DC

## 2021-12-28 NOTE — Telephone Encounter (Signed)
Rx 10/28/21 #90- too soon ?Requested Prescriptions  ?Pending Prescriptions Disp Refills  ?? rosuvastatin (CRESTOR) 40 MG tablet [Pharmacy Med Name: ROSUVASTATIN CALCIUM 40 MG TAB] 90 tablet 0  ?  Sig: TAKE 1 TABLET BY MOUTH EVERYDAY AT BEDTIME  ?  ? Cardiovascular:  Antilipid - Statins 2 Failed - 12/27/2021  3:23 PM  ?  ?  Failed - Lipid Panel in normal range within the last 12 months  ?  Cholesterol  ?Date Value Ref Range Status  ?07/08/2021 163 <200 mg/dL Final  ? ?LDL Cholesterol (Calc)  ?Date Value Ref Range Status  ?07/08/2021 90 mg/dL (calc) Final  ?  Comment:  ?  Reference range: <100 ?Marland Kitchen ?Desirable range <100 mg/dL for primary prevention;   ?<70 mg/dL for patients with CHD or diabetic patients  ?with > or = 2 CHD risk factors. ?. ?LDL-C is now calculated using the Martin-Hopkins  ?calculation, which is a validated novel method providing  ?better accuracy than the Friedewald equation in the  ?estimation of LDL-C.  ?Horald Pollen et al. Lenox Ahr. 4166;063(01): 2061-2068  ?(http://education.[employer].com/faq/FAQ164) ?  ? ?HDL  ?Date Value Ref Range Status  ?07/08/2021 53 > OR = 50 mg/dL Final  ? ?Triglycerides  ?Date Value Ref Range Status  ?07/08/2021 102 <150 mg/dL Final  ? ?  ?  ?  Passed - Cr in normal range and within 360 days  ?  Creat  ?Date Value Ref Range Status  ?07/08/2021 0.81 0.60 - 1.00 mg/dL Final  ? ?Creatinine, Urine  ?Date Value Ref Range Status  ?05/09/2018 81 20 - 275 mg/dL Final  ?   ?  ?  Passed - Patient is not pregnant  ?  ?  Passed - Valid encounter within last 12 months  ?  Recent Outpatient Visits   ?      ? 1 week ago Acute recurrent frontal sinusitis  ? [facility] [name], [name]  ? 1 month ago Acute viral labyrinthitis, unspecified laterality  ? [facility] Margarita Mail, DO  ? 1 month ago Viral upper respiratory tract infection  ? [facility] [name], [name]  ? 2 months ago Viral upper respiratory tract  infection  ? [facility] [name], [name]  ? 3 months ago Moderate persistent asthma, unspecified whether complicated  ? [facility] [name], [name]  ?  ?  ?Future Appointments   ?        ? In 2 weeks Salena Saner, MD [facility]  ?  ? ?  ?  ?  ? ? ?

## 2021-12-29 ENCOUNTER — Ambulatory Visit
Admission: RE | Admit: 2021-12-29 | Discharge: 2021-12-29 | Disposition: A | Discharge status: Home / Self Care | Payer: Medicare HMO | Source: Ambulatory Visit | Attending: Nurse Practitioner | Admitting: Nurse Practitioner

## 2021-12-29 DIAGNOSIS — R0602 Shortness of breath: Secondary | ICD-10-CM | POA: Insufficient documentation

## 2021-12-29 DIAGNOSIS — J9859 Other diseases of mediastinum, not elsewhere classified: Secondary | ICD-10-CM | POA: Diagnosis not present

## 2021-12-29 DIAGNOSIS — J984 Other disorders of lung: Secondary | ICD-10-CM | POA: Diagnosis not present

## 2021-12-29 DIAGNOSIS — I700 Atherosclerosis of aorta: Secondary | ICD-10-CM | POA: Diagnosis not present

## 2021-12-29 DIAGNOSIS — I2510 Atherosclerotic heart disease of native coronary artery without angina pectoris: Secondary | ICD-10-CM | POA: Diagnosis not present

## 2022-01-13 ENCOUNTER — Ambulatory Visit: Payer: Medicare HMO | Admitting: Pulmonary Disease

## 2022-01-18 ENCOUNTER — Other Ambulatory Visit: Payer: Self-pay | Admitting: Family Medicine

## 2022-01-28 ENCOUNTER — Encounter: Payer: Self-pay | Admitting: Nurse Practitioner

## 2022-01-28 ENCOUNTER — Other Ambulatory Visit: Payer: Self-pay

## 2022-01-28 ENCOUNTER — Ambulatory Visit (INDEPENDENT_AMBULATORY_CARE_PROVIDER_SITE_OTHER): Payer: Medicare HMO | Admitting: Nurse Practitioner

## 2022-01-28 DIAGNOSIS — R051 Acute cough: Secondary | ICD-10-CM | POA: Diagnosis not present

## 2022-01-28 DIAGNOSIS — J0111 Acute recurrent frontal sinusitis: Secondary | ICD-10-CM

## 2022-01-28 MED ORDER — PROMETHAZINE-DM 6.25-15 MG/5ML PO SYRP: 5.0000 mL | ORAL_SOLUTION | Freq: Four times a day (QID) | ORAL | 0 refills | Status: DC | PRN

## 2022-01-28 MED ORDER — AZITHROMYCIN 250 MG PO TABS: ORAL_TABLET | ORAL | 0 refills | Status: AC

## 2022-01-28 NOTE — Progress Notes (Signed)
Name: Lori Potts   MRN: [id number]    DOB: 09-25-1945   Date:01/28/2022       Progress Note  Subjective  Chief Complaint  Chief Complaint  Patient presents with   Cough    Congested, headache    I connected with  [name]  on 01/28/22 at 11:45am by a video enabled telemedicine application and verified that I am speaking with the correct person using two identifiers.  I discussed the limitations of evaluation and management by telemedicine and the availability of in person appointments. The patient expressed understanding and agreed to proceed with a virtual visit  Staff also discussed with the patient that there may be a patient responsible charge related to this service. Patient Location: home Provider Location: [facility] Additional Individuals present: alone  HPI  Sinus infection/cough: Patient reports she has been sick for quite a while.  She says she got better and then this morning it was even worse.  Patient states she got a headache and a lot of facial pressure she says her face feels really tender.  She denies any fever or shortness of breath.  She says she has done some home COVID test which were negative.  Patient does not like to use steroids due to her diabetes.  She states she has been taking her allergy medication which includes Singulair, Xyzal, Flonase, and Astelin.  Discussed with patient to continue allergy medication.  She can do warm compresses across her face.  She can take Tylenol for her headache.  We will send in Z-Pak for sinus infection, and cough medicine in for her cough.  Patient Active Problem List   Diagnosis Date Noted   Neutropenia, unspecified type (HCC) 11/25/2020   Intractable migraine with aura without status migrainosus 04/18/2020   Body mass index (BMI) 31.0-31.9, adult 02/06/2020   Chronic left hip pain 12/26/2019   Anemia 09/22/2017   S/P knee replacement 07/28/2017   Allergic rhinitis 05/26/2017   Vitamin D deficiency 05/19/2017    Chronic nonintractable headache 05/18/2017   Advanced care planning/counseling discussion    Gastroesophageal reflux disease    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HCC) 03/02/2017   Insomnia 11/29/2016   Chronic right-sided low back pain with right-sided sciatica 04/19/2016   Abdominal wall pain in right flank 03/29/2016   Diverticulosis 02/24/2016   Hypercholesterolemia 06/16/2015   Chronic pain 04/13/2015   Neck pain 02/26/2015   Status post lumbar surgery 02/26/2015   Abnormal ECG 01/27/2015   Angina pectoris (HCC) 01/27/2015   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) 01/27/2015   Essential (primary) hypertension 01/27/2015    Social History   Tobacco Use   Smoking status: Former    Types: Cigarettes    Quit date: 2006    Years since quitting: 17.4   Smokeless tobacco: Never   Tobacco comments:    quit smoking 70yrs ago  Substance Use Topics   Alcohol use: No     Current Outpatient Medications:    albuterol (VENTOLIN HFA) 108 (90 Base) MCG/ACT inhaler, Inhale 2 puffs into the lungs every 6 (six) hours as needed for wheezing or shortness of breath., Disp: 18 g, Rfl: 2   ascorbic acid (VITAMIN C) 500 MG tablet, Take 500 mg by mouth daily., Disp: , Rfl:    aspirin 81 MG EC tablet, Take 1 tablet by mouth daily., Disp: , Rfl:    azelastine (ASTELIN) 0.1 % nasal spray, PLACE 1 SPRAY INTO BOTH NOSTRILS 2 (TWO) TIMES DAILY.  USE IN EACH NOSTRIL AS DIRECTED, Disp: 90 mL, Rfl: 1   azithromycin (ZITHROMAX) 250 MG tablet, Take 2 tablets on day 1, then 1 tablet daily on days 2 through 5, Disp: 6 tablet, Rfl: 0   citalopram (CELEXA) 40 MG tablet, Take 1 tablet (40 mg total) by mouth daily., Disp: 90 tablet, Rfl: 1   Dulaglutide (TRULICITY) 8.11 XB/2.6OM SOPN, Inject 0.75 mg into the skin once a week. Patient receives via [employer] through Dec 2023, Disp: , Rfl:    esomeprazole (NEXIUM) 40 MG capsule, Take 1 capsule (40 mg total) by mouth daily.  Follow up in office for more refills, Disp: 90 capsule, Rfl: 1   fluticasone (FLONASE) 50 MCG/ACT nasal spray, PLACE 2 SPRAYS INTO BOTH NOSTRILS EVERY EVENING., Disp: 48 mL, Rfl: 1   Fluticasone-Umeclidin-Vilant (TRELEGY ELLIPTA) 100-62.5-25 MCG/ACT AEPB, Inhale 1 puff into the lungs daily., Disp: 60 each, Rfl: 11   Galcanezumab-gnlm (EMGALITY) 120 MG/ML SOAJ, Inject 120 mg into the skin every 30 (thirty) days., Disp: 1.12 mL, Rfl: 3   glipiZIDE (GLUCOTROL XL) 2.5 MG 24 hr tablet, TAKE 1 TABLET BY MOUTH EVERY DAY WITH BREAKFAST, Disp: 30 tablet, Rfl: 0   levocetirizine (XYZAL) 5 MG tablet, Take 1 tablet (5 mg total) by mouth every evening., Disp: 30 tablet, Rfl: 2   losartan (COZAAR) 25 MG tablet, Take 1 tablet (25 mg total) by mouth daily., Disp: 90 tablet, Rfl: 3   methocarbamol (ROBAXIN) 500 MG tablet, Take 500 mg by mouth every 8 (eight) hours as needed for muscle spasms., Disp: , Rfl:    montelukast (SINGULAIR) 10 MG tablet, TAKE 1 TABLET BY MOUTH EVERYDAY AT BEDTIME, Disp: 90 tablet, Rfl: 3   Multiple Vitamins-Minerals (MULTIVITAMIN WITH MINERALS) tablet, Take 1 tablet by mouth daily., Disp: , Rfl:    Oxycodone HCl 10 MG TABS, , Disp: , Rfl:    propranolol (INDERAL) 10 MG tablet, TAKE 1 TABLET BY MOUTH THREE TIMES A DAY, Disp: 270 tablet, Rfl: 3   rosuvastatin (CRESTOR) 40 MG tablet, TAKE 1 TABLET BY MOUTH EVERYDAY AT BEDTIME, Disp: 90 tablet, Rfl: 1   sodium chloride (OCEAN) 0.65 % SOLN nasal spray, Place 1 spray into both nostrils 2 (two) times daily as needed for congestion., Disp: 60 mL, Rfl: 5   Blood Glucose Monitoring Suppl (ACCU-CHEK GUIDE ME) w/Device KIT, DISPENSE BASED ON PATIENT AND INSURANCE PREFERENCE. USE UP TO FOUR TIMES DAILY AS DIRECTED. (FOR ICD-9 250.00, 250.01). (Patient not taking: Reported on 01/28/2022), Disp: 1 kit, Rfl: 0   methylPREDNISolone (MEDROL DOSEPAK) 4 MG TBPK tablet, Day 1: Take 8 mg (2 tablets) before breakfast, 4 mg (1 tablet) after lunch, 4 mg (1 tablet)  after supper, and 8 mg (2 tablets) at bedtime. Day 2:Take 4 mg (1 tablet) before breakfast, 4 mg (1 tablet) after lunch, 4 mg (1 tablet) after supper, and 8 mg (2 tablets) at bedtime. Day 3: Take 4 mg (1 tablet) before breakfast, 4 mg (1 tablet) after lunch, 4 mg (1 tablet) after supper, and 4 mg (1 tablet) at bedtime. Day 4: Take 4 mg (1 tablet) before breakfast, 4 mg (1 tablet) after lunch, and 4 mg (1 tablet) at bedtime. Day 5: Take 4 mg (1 tablet) before breakfast and 4 mg (1 tablet) at bedtime. Day 6: Take 4 mg (1 tablet) before breakfast. (Patient not taking: Reported on 01/28/2022), Disp: 1 each, Rfl: 0   promethazine-dextromethorphan (PROMETHAZINE-DM) 6.25-15 MG/5ML syrup, Take 5 mLs by mouth 4 (four) times  daily as needed for cough., Disp: 118 mL, Rfl: 0  Allergies  Allergen Reactions   Metformin And Related Diarrhea   Adhesive [Tape] Rash and Other (See Comments)    Regular tape is ok, allergy is to paper tape    I personally reviewed active problem list, medication list, allergies, notes from last encounter with the patient/caregiver today.  ROS  Constitutional: Negative for fever or weight change.  Respiratory: Negative for cough and shortness of breath.   Cardiovascular: Negative for chest pain or palpitations.  Gastrointestinal: Negative for abdominal pain, no bowel changes.  Musculoskeletal: Negative for gait problem or joint swelling.  Skin: Negative for rash.  Neurological: Negative for dizziness or headache.  No other specific complaints in a complete review of systems (except as listed in HPI above).   Objective  Virtual encounter, vitals not obtained.  There is no height or weight on file to calculate BMI.  Nursing Note and Vital Signs reviewed.  Physical Exam  Awake, alert and oriented, speaking in complete sentences  No results found for this or any previous visit (from the past 72 hour(s)).  Assessment & Plan  1. Acute recurrent frontal sinusitis Comments:  Continue to use allergy medication.  Drink plenty of fluids.  Use warm compresses across the face.  Tylenol for headache.  Sent in prescription for antibiotic. - azithromycin (ZITHROMAX) 250 MG tablet; Take 2 tablets on day 1, then 1 tablet daily on days 2 through 5  Dispense: 6 tablet; Refill: 0  2. Acute cough Comments: Continue to take allergy medication, use your inhalers and use cough medicine sparingly. - promethazine-dextromethorphan (PROMETHAZINE-DM) 6.25-15 MG/5ML syrup; Take 5 mLs by mouth 4 (four) times daily as needed for cough.  Dispense: 118 mL; Refill: 0    -Red flags and when to present for emergency care or RTC including fever >101.98F, chest pain, shortness of breath, new/worsening/un-resolving symptoms,  reviewed with patient at time of visit. Follow up and care instructions discussed and provided in AVS. - I discussed the assessment and treatment plan with the patient. The patient was provided an opportunity to ask questions and all were answered. The patient agreed with the plan and demonstrated an understanding of the instructions.  I provided 15 minutes of non-face-to-face time during this encounter.  Bo Merino, [name]

## 2022-02-15 ENCOUNTER — Telehealth: Payer: Self-pay

## 2022-02-15 NOTE — Progress Notes (Signed)
Chronic Care Management APPOINTMENT REMINDER   Lori Potts was reminded to have all medications, supplements and any blood glucose and blood pressure readings available for review with Lori Potts, Pharm. D, at her telephone visit on 02/16/2022 at 8:30 am.  Patient states she is interest in get a freestyle libre device and would like to discuss this with the clinical pharmacist at her appointment.Patient reports she is having a hard time checking her blood sugar at home.Patient states she has a lot going on since her husband is currently admitted in the hospital due to a infection after he had a kidney transplant.  Lori Potts Clinical Pharmacist Assistant [phone]

## 2022-02-15 NOTE — Progress Notes (Signed)
Chronic Care Management Pharmacy Note  02/16/2022 Name:  RAYVIN ABID MRN:  [id number] DOB:  28-Sep-1945  Summary: Patient presents for CCM follow-up.  -Patient reports having both Trelegy and Symbicort at home it is unclear to me which one she has been using consistently. When asked she states that she feels her Symbicort has been working well for her and she would prefer to stay on that due to the cost.     Patient has not been monitoring blood sugars due to needing test strips and lancets for new device.   Recommendations/Changes made from today's visit: -STOP Trelegy (Cost)  -Resume taking only Symbicort 160-4.5 mcg/act 2 puffs twice daily via patient assistance. -Refill of albuterol sent in today  -Patient to inform pharmacy team which glucometer she uses so testing supplies can be sent in.   Plan: CPP follow-up in 1 months  Subjective: NATALIYA GRAIG is an 76 y.o. year old female who is a primary patient of Bo Merino, [name].  The CCM team was consulted for assistance with disease management and care coordination needs.    Engaged with patient by telephone for follow up visit in response to provider referral for pharmacy case management and/or care coordination services.   Consent to Services:  The patient was given information about Chronic Care Management services, agreed to services, and gave verbal consent prior to initiation of services.  Please see initial visit note for detailed documentation.   Patient Care Team: Bo Merino, [name] as PCP - General (Nurse Practitioner) Germaine Pomfret, [facility] (Pharmacist) Suella Broad, MD as Consulting Physician (Physical Medicine and Rehabilitation) Tyler Pita, MD as Consulting Physician (Pulmonary Disease) Lovell Sheehan, MD as Consulting Physician (Orthopedic Surgery)  Recent office visits: 01/28/22: Patient presented to Serafina Royals, [name] for acute sinusitis. Azithromycin.  11/22/21: Patient presented to Dr.  Rosana Berger for acute labyrinthitis. Methylprednisolone x 7 days.  10/20/21: Patient presented to Serafina Royals, [name] for viral URI.  09/13/21: Patient presented to Serafina Royals for asthma. Trelegy 1 puff daily.  08/13/22: Patient presented to Serafina Royals for anxiety. Citalopram 40 mg dai  Recent consult visits: 11/11/21: Patient presented to Dr. Patsey Berthold (Pulmonology)  Hospital visits: None in previous 6 months   Objective:  Lab Results  Component Value Date   CREATININE 0.81 07/08/2021   BUN 18 07/08/2021   GFRNONAA >60 09/14/2020   GFRAA 101 02/18/2020   NA 143 07/08/2021   K 4.1 07/08/2021   CALCIUM 10.1 07/08/2021   CO2 28 07/08/2021   GLUCOSE 103 (H) 07/08/2021    Lab Results  Component Value Date/Time   HGBA1C 6.9 (H) 07/08/2021 03:15 PM   HGBA1C 7.2 (A) 11/25/2020 08:04 AM   HGBA1C 7.2 (H) 11/14/2019 12:06 PM   HGBA1C 6.6 03/31/2019 12:00 AM   MICROALBUR 5.2 05/14/2019 12:00 AM   MICROALBUR 3.8 05/09/2018 09:50 AM   MICROALBUR 20 07/04/2016 10:36 AM    Last diabetic Eye exam:  Lab Results  Component Value Date/Time   HMDIABEYEEXA No Retinopathy 06/02/2020 12:00 AM    Last diabetic Foot exam: No results found for: "HMDIABFOOTEX"   Lab Results  Component Value Date   CHOL 163 07/08/2021   HDL 53 07/08/2021   LDLCALC 90 07/08/2021   TRIG 102 07/08/2021   CHOLHDL 3.1 07/08/2021       Latest Ref Rng & Units 07/08/2021    3:15 PM 11/25/2020    8:39 AM 02/18/2020    9:37 AM  Hepatic Function  Total Protein 6.1 - 8.1 g/dL 7.3  7.2  6.6   AST 10 - 35 U/L _0 ALT 6 - 29 U/L _1 Total Bilirubin 0.2 - 1.2 mg/dL 0.4  0.4  0.5     Lab Results  Component Value Date/Time   TSH 2.00 02/18/2020 09:37 AM   TSH 0.71 11/14/2019 12:06 PM   FREET4 1.1 02/18/2020 09:37 AM       Latest Ref Rng & Units 07/08/2021    3:15 PM 01/19/2021   12:00 AM 09/14/2020    9:37 AM  CBC  WBC 3.8 - 10.8 Thousand/uL 4.9  5.9  5.3   Hemoglobin 11.7 - 15.5 g/dL 12.5   11.7  13.0   Hematocrit 35.0 - 45.0 % 38.5  36.3  39.3   Platelets 140 - 400 Thousand/uL 441  531  414     Lab Results  Component Value Date/Time   VD25OH 24 (L) 05/09/2018 09:50 AM   VD25OH 25 (L) 09/11/2017 02:09 PM    Clinical ASCVD: No  The 10-year ASCVD risk score (Arnett DK, et al., 2019) is: 32.6%   Values used to calculate the score:     Age: 76 years     Sex: Female     Is Non-Hispanic African American: Yes     Diabetic: Yes     Tobacco smoker: No     Systolic Blood Pressure: 902 mmHg     Is BP treated: Yes     HDL Cholesterol: 53 mg/dL     Total Cholesterol: 163 mg/dL       01/28/2022   11:47 AM 12/17/2021    9:26 AM 11/22/2021    2:51 PM  Depression screen PHQ 2/9  Decreased Interest 0 0 0  Down, Depressed, Hopeless 0 0 0  PHQ - 2 Score 0 0 0  Altered sleeping   0  Tired, decreased energy   0  Change in appetite   0  Feeling bad or failure about yourself    0  Trouble concentrating   0  Moving slowly or fidgety/restless   0  Suicidal thoughts   0  PHQ-9 Score   0  Difficult doing work/chores   Not difficult at all     Social History   Tobacco Use  Smoking Status Former   Types: Cigarettes   Quit date: 2006   Years since quitting: 17.4  Smokeless Tobacco Never  Tobacco Comments   quit smoking 72yr ago   BP Readings from Last 3 Encounters:  11/22/21 (!) 142/80  11/11/21 126/80  07/08/21 128/76   Pulse Readings from Last 3 Encounters:  11/22/21 73  11/11/21 65  07/08/21 82   Wt Readings from Last 3 Encounters:  11/22/21 188 lb (85.3 kg)  11/11/21 186 lb 12.8 oz (84.7 kg)  07/08/21 183 lb 3.2 oz (83.1 kg)   BMI Readings from Last 3 Encounters:  11/22/21 32.27 kg/m  11/11/21 32.57 kg/m  07/08/21 31.45 kg/m    Assessment/Interventions: Review of patient past medical history, allergies, medications, health status, including review of consultants reports, laboratory and other test data, was performed as part of comprehensive evaluation and  provision of chronic care management services.   SDOH:  (Social Determinants of Health) assessments and interventions performed: Yes    SDOH Screenings   Alcohol Screen: Low Risk  (01/28/2022)   Alcohol Screen    Last Alcohol Screening Score (AUDIT): 0  Depression (PHQ2-9): Low Risk  (01/28/2022)   Depression (PHQ2-9)    PHQ-2 Score: 0  Financial Resource Strain: Medium Risk (11/24/2021)   Overall Financial Resource Strain (CARDIA)    Difficulty of Paying Living Expenses: Somewhat hard  Food Insecurity: No Food Insecurity (11/18/2021)   Hunger Vital Sign    Worried About Running Out of Food in the Last Year: Never true    [place] in the Last Year: Never true  Housing: Low Risk  (11/18/2021)   Housing    Last Housing Risk Score: 0  Physical Activity: Inactive (11/18/2021)   Exercise Vital Sign    Days of Exercise per Week: 0 days    Minutes of Exercise per Session: 0 min  Social Connections: Socially Integrated (11/18/2021)   Social Connection and Isolation Panel [NHANES]    Frequency of Communication with Friends and Family: More than three times a week    Frequency of Social Gatherings with Friends and Family: Twice a week    Attends Religious Services: More than 4 times per year    Active Member of [employer] or Organizations: Yes    Attends Archivist Meetings: 1 to 4 times per year    Marital Status: Married  Stress: No Stress Concern Present (11/18/2021)   [place] of Stress : Not at all  Tobacco Use: Medium Risk (01/28/2022)   Patient History    Smoking Tobacco Use: Former    Smokeless Tobacco Use: Never    Passive Exposure: Not on file  Transportation Needs: No Transportation Needs (11/18/2021)   PRAPARE - Hydrologist (Medical): No    Lack of Transportation (Non-Medical): No    CCM Care Plan  Allergies  Allergen Reactions   Metformin And Related  Diarrhea   Adhesive [Tape] Rash and Other (See Comments)    Regular tape is ok, allergy is to paper tape    Medications Reviewed Today     Reviewed by Hollie Salk, [place] (Registered Medical Assistant) on 01/28/22 at 1146  Med List Status: <None>   Medication Order Taking? Sig Documenting Provider Last Dose Status Informant  albuterol (VENTOLIN HFA) 108 (90 Base) MCG/ACT inhaler [id number] Yes Inhale 2 puffs into the lungs every 6 (six) hours as needed for wheezing or shortness of breath. Tyler Pita, MD Taking Active   ascorbic acid (VITAMIN C) 500 MG tablet [id number] Yes Take 500 mg by mouth daily. [provider] Taking Active Self  aspirin 81 MG EC tablet [id number] Yes Take 1 tablet by mouth daily. [provider] Taking Active   azelastine (ASTELIN) 0.1 % nasal spray [id number] Yes PLACE 1 SPRAY INTO BOTH NOSTRILS 2 (TWO) TIMES DAILY. USE IN EACH NOSTRIL AS DIRECTED Steele Sizer, MD Taking Active   Blood Glucose Monitoring Suppl (ACCU-CHEK GUIDE ME) w/Device KIT [id number] No DISPENSE BASED ON PATIENT AND INSURANCE PREFERENCE. USE UP TO FOUR TIMES DAILY AS DIRECTED. (FOR ICD-9 250.00, 250.01).  Patient not taking: Reported on 01/28/2022   Bo Merino, [name] Not Taking Active   citalopram (CELEXA) 40 MG tablet [id number] Yes Take 1 tablet (40 mg total) by mouth daily. Bo Merino, [name] Taking Active   Dulaglutide (TRULICITY) 7.42 VZ/5.6LO Bon[place] Aid [id number] Yes Inject 0.75 mg into the skin once a week. Patient receives via [employer] Patient Assistance through Dec 2023 Bo Merino, [name] Taking Active   esomeprazole (NEXIUM) 40 MG capsule  [id number] Yes Take 1 capsule (40 mg total) by mouth daily. Follow up in office for more refills Bo Merino, [name] Taking Active   fluticasone (FLONASE) 50 MCG/ACT nasal spray [id number] Yes PLACE 2 SPRAYS INTO BOTH NOSTRILS EVERY EVENING. Steele Sizer, MD Taking Active   Fluticasone-Umeclidin-Vilant [facility] ELLIPTA)  100-62.5-25 MCG/ACT AEPB [id number] Yes Inhale 1 puff into the lungs daily. Tyler Pita, MD Taking Active   Galcanezumab-gnlm [facility]) 120 MG/ML Darden Palmer [id number] Yes Inject 120 mg into the skin every 30 (thirty) days. Bo Merino, [name] Taking Active   glipiZIDE (GLUCOTROL XL) 2.5 MG 24 hr tablet [id number] Yes TAKE 1 TABLET BY MOUTH EVERY DAY WITH Alta Corning, Drue Stager, MD Taking Active   levocetirizine (XYZAL) 5 MG tablet [id number] Yes Take 1 tablet (5 mg total) by mouth every evening. Hubbard Hartshorn, [name] Taking Active   losartan (COZAAR) 25 MG tablet [id number] Yes Take 1 tablet (25 mg total) by mouth daily. Bo Merino, [name] Taking Active   methocarbamol (ROBAXIN) 500 MG tablet [id number] Yes Take 500 mg by mouth every 8 (eight) hours as needed for muscle spasms. [provider] Taking Active   methylPREDNISolone (MEDROL DOSEPAK) 4 MG TBPK tablet [id number] No Day 1: Take 8 mg (2 tablets) before breakfast, 4 mg (1 tablet) after lunch, 4 mg (1 tablet) after supper, and 8 mg (2 tablets) at bedtime. Day 2:Take 4 mg (1 tablet) before breakfast, 4 mg (1 tablet) after lunch, 4 mg (1 tablet) after supper, and 8 mg (2 tablets) at bedtime. Day 3: Take 4 mg (1 tablet) before breakfast, 4 mg (1 tablet) after lunch, 4 mg (1 tablet) after supper, and 4 mg (1 tablet) at bedtime. Day 4: Take 4 mg (1 tablet) before breakfast, 4 mg (1 tablet) after lunch, and 4 mg (1 tablet) at bedtime. Day 5: Take 4 mg (1 tablet) before breakfast and 4 mg (1 tablet) at bedtime. Day 6: Take 4 mg (1 tablet) before breakfast.  Patient not taking: Reported on 01/28/2022   Teodora Medici, DO Not Taking Active   montelukast (SINGULAIR) 10 MG tablet [id number] Yes TAKE 1 TABLET BY MOUTH EVERYDAY AT BEDTIME Bo Merino, [name] Taking Active   Multiple Vitamins-Minerals (MULTIVITAMIN WITH MINERALS) tablet [id number] Yes Take 1 tablet by mouth daily. [provider] Taking Active Self  Oxycodone HCl 10 MG TABS  [id number] Yes  [provider] Taking Active   promethazine-dextromethorphan (PROMETHAZINE-DM) 6.25-15 MG/5ML syrup [id number] Yes Take 5 mLs by mouth 4 (four) times daily as needed for cough. Bo Merino, [name] Taking Active   propranolol (INDERAL) 10 MG tablet [id number] Yes TAKE 1 TABLET BY MOUTH THREE TIMES A DAY Bo Merino, [name] Taking Active   rosuvastatin (CRESTOR) 40 MG tablet [id number] Yes TAKE 1 TABLET BY MOUTH EVERYDAY AT BEDTIME Bo Merino, [name] Taking Active   sodium chloride (OCEAN) 0.65 % SOLN nasal spray [id number] Yes Place 1 spray into both nostrils 2 (two) times daily as needed for congestion. Fredderick Severance, NP Taking Active Self            Patient Active Problem List   Diagnosis Date Noted   Moderate persistent asthma without complication 02/33/4356   Neutropenia, unspecified type ([place]) 11/25/2020   Intractable migraine with aura without status migrainosus 04/18/2020   Body mass index (BMI) 31.0-31.9, adult 02/06/2020   Chronic left hip pain 12/26/2019   Anemia 09/22/2017   S/P knee replacement 07/28/2017   Allergic rhinitis 05/26/2017  Vitamin D deficiency 05/19/2017   Chronic nonintractable headache 05/18/2017   Advanced care planning/counseling discussion    Gastroesophageal reflux disease    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[place]) 03/02/2017   Insomnia 11/29/2016   Chronic right-sided low back pain with right-sided sciatica 04/19/2016   Abdominal wall pain in right flank 03/29/2016   Diverticulosis 02/24/2016   Hypercholesterolemia 06/16/2015   Chronic pain 04/13/2015   Neck pain 02/26/2015   Status post lumbar surgery 02/26/2015   Abnormal ECG 01/27/2015   Angina pectoris ([place]) 01/27/2015   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[place]) 01/27/2015   Essential (primary) hypertension 01/27/2015    Immunization History  Administered Date(s) Administered   Fluad Quad(high Dose 65+) 05/08/2019, 06/07/2021    Influenza, High Dose Seasonal PF 06/10/2015, 06/20/2016, 05/09/2017, 05/09/2018, 04/17/2020   PFIZER(Purple Top)SARS-COV-2 Vaccination 09/11/2019, 10/02/2019, 06/08/2020, 12/29/2020   PNEUMOCOCCAL CONJUGATE-20 06/07/2021   Pfizer Covid-19 Vaccine Bivalent Booster 39yr & up 07/14/2021   Pneumococcal Conjugate-13 11/28/2013   Pneumococcal Polysaccharide-23 03/16/2018   Tdap 05/08/2019    Conditions to be addressed/monitored:  Hypertension, Hyperlipidemia, Diabetes, Coronary Artery Disease, GERD, and Allergic Rhinitis  Care Plan : General Pharmacy (Adult)  Updates made by FGermaine Pomfret RPH since 02/16/2022 12:00 AM     Problem: Hypertension, Hyperlipidemia, Diabetes, Coronary Artery Disease, GERD, and Allergic Rhinitis   Priority: High     Long-Range Goal: Patient-Specific Goal   Start Date: 03/18/2021  Expected End Date: 02/17/2023  This Visit's Progress: On track  Recent Progress: On track  Priority: High  Note:   Current Barriers:  Unable to independently afford treatment regimen Unable to achieve control of cholesterol   Pharmacist Clinical Goal(s):  Patient will achieve control of cholesterol as evidenced by LDL less than 100 maintain control of diabetes as evidenced by A1c less than 8%  through collaboration with PharmD and provider.   Interventions: 1:1 collaboration with TDelsa Grana PA-C regarding development and update of comprehensive plan of care as evidenced by provider attestation and co-signature Inter-disciplinary care team collaboration (see longitudinal plan of care) Comprehensive medication review performed; medication list updated in electronic medical record  Hypertension (BP goal <140/90) -Controlled -Current treatment: Losartan 25 mg daily  Propanolol 10 mg three times daily  -Medications previously tried: NA  -Current home readings: NA -Denies hypotensive/hypertensive symptoms -Counseled to monitor BP at home weekly, document, and provide log  at future appointments -Recommended to continue current medication  Hyperlipidemia: (LDL goal < 100) -Uncontrolled -Current treatment: Rosuvastatin 40 mg daily  -Medications previously tried: NA   Diabetes (A1c goal <8%) -Controlled -Current medications: Trulicity 03.66mg weekly: Appropriate, Effective, Safe, Accessible -Medications previously tried: NA  -Current home glucose readings fasting glucose: has not been able to monitor due to not having necessary test strips or lancets. Patient unsure what glucometer she uses  -Reports hypoglycemic symptoms: NA  -Patient to report what glucometer she uses to clinical pharmacy team and I will send in refills.   Asthma with Allergic Rhinitis (Goal: control symptoms and prevent exacerbations) -Well Controlled -Current treatment  Albuterol HFA 1 puff every 6 hours as needed   Montelukast 10 mg nightly  Symbicort 160-4.5 mcg/act 2 puffs twice daily  -Current allergy treatment  Azelastine nasal spray twice daily  Xyzal 5 mg nightly  Ocean nasal spray  -Medications previously tried: BFirefighter(ineffective), Trelegy (cost)  -Pulmonary function testing: FEV1 49%, 65% Post-BD. FEV1/FVC 76% (May 2023)  -Patient reports having both Trelegy and Symbicort at home it is unclear  to me which one she has been using consistently. When asked she states that she feels her Symbicort has been working well for her and she would prefer to stay on that due to the cost.  -Patient reports hoarse throat.  -Exacerbations requiring treatment in last 6 months: No -Frequency of rescue inhaler use: Has not needed in past month, but does not refill today. .  -If symptoms remain in excellent control could consider stepping down to medium dose ICS therapy.  -Continue current medications.   Patient Goals/Self-Care Activities Patient will:  - check glucose daily before breakfast, document, and provide at future appointments check blood pressure weekly, document, and provide  at future appointments  Follow Up Plan: Telephone follow up appointment with care management team member scheduled for:  03/30/2022 at 1:45 PM     Medication Assistance:  Trulicity obtained through [place] cares medication assistance program.  Enrollment ends Dec 2023 Symbicort obtained through [place] cares medication assistance program.   Compliance/Adherence/Medication fill history: Care Gaps: Mammogram  Star-Rating Drugs: Rosuvastatin 40 mg last filled on 09/25/21 for a 90-Day supply with [facility] Losartan 25 mg last filled on 09/15/2020 for a 90-Day supply with [facility]  Patient's preferred pharmacy is:  [id number] [place] [place]Phone: [phone]: [phone] [address] [address]Phone: [phone]: [phone] [facility] [address] [place] [address]Phone: [phone]: [phone] [id number] [place]: [phone]: [address]one: [phone]: [phone] Uses pill box? Yes Pt endorses 100% compliance  We discussed: Current pharmacy is preferred with insurance plan and patient is satisfied with pharmacy services Patient decided to: Continue current medication management strategy  Care Plan and Follow Up Patient Decision:  Patient agrees to Care Plan and Follow-up.  Plan: Telephone follow up appointment with care management team member scheduled for:  03/30/2022 at 1:45 PM  AlMalva LimesCPBurnham Medical [phone]

## 2022-02-16 ENCOUNTER — Ambulatory Visit: Payer: Medicare HMO

## 2022-02-16 DIAGNOSIS — E0829 Diabetes mellitus due to underlying condition with other diabetic kidney complication: Secondary | ICD-10-CM

## 2022-02-16 DIAGNOSIS — J45909 Unspecified asthma, uncomplicated: Secondary | ICD-10-CM

## 2022-02-16 DIAGNOSIS — J4540 Moderate persistent asthma, uncomplicated: Secondary | ICD-10-CM

## 2022-02-16 MED ORDER — ALBUTEROL SULFATE HFA 108 (90 BASE) MCG/ACT IN AERS: 2.0000 | INHALATION_SPRAY | Freq: Four times a day (QID) | RESPIRATORY_TRACT | 2 refills | Status: DC | PRN

## 2022-02-16 MED ORDER — BUDESONIDE-FORMOTEROL FUMARATE 160-4.5 MCG/ACT IN AERO: 2.0000 | INHALATION_SPRAY | Freq: Two times a day (BID) | RESPIRATORY_TRACT | Status: DC

## 2022-02-16 NOTE — Patient Instructions (Signed)
Visit Information It was great speaking with you today!  Please let me know if you have any questions about our visit.   Goals Addressed             This Visit's Progress    Monitor and Manage My Blood Sugar-Diabetes Type 2   Not on track    Timeframe:  Long-Range Goal Priority:  High Start Date: 03/18/21                            Expected End Date: 03/19/23                      Follow Up within 30 days   - check blood sugar at prescribed times - check blood sugar if I feel it is too high or too low - enter blood sugar readings and medication or insulin into daily log    Why is this important?   Checking your blood sugar at home helps to keep it from getting very high or very low.  Writing the results in a diary or log helps the doctor know how to care for you.  Your blood sugar log should have the time, date and the results.  Also, write down the amount of insulin or other medicine that you take.  Other information, like what you ate, exercise done and how you were feeling, will also be helpful.     Notes:         Patient Care Plan: General Pharmacy (Adult)     Problem Identified: Hypertension, Hyperlipidemia, Diabetes, Coronary Artery Disease, GERD, and Allergic Rhinitis   Priority: High     Long-Range Goal: Patient-Specific Goal   Start Date: 03/18/2021  Expected End Date: 02/17/2023  This Visit's Progress: On track  Recent Progress: On track  Priority: High  Note:   Current Barriers:  Unable to independently afford treatment regimen Unable to achieve control of cholesterol   Pharmacist Clinical Goal(s):  Patient will achieve control of cholesterol as evidenced by LDL less than 100 maintain control of diabetes as evidenced by A1c less than 8%  through collaboration with PharmD and provider.   Interventions: 1:1 collaboration with Danelle Berry, PA-C regarding development and update of comprehensive plan of care as evidenced by provider attestation and  co-signature Inter-disciplinary care team collaboration (see longitudinal plan of care) Comprehensive medication review performed; medication list updated in electronic medical record  Hypertension (BP goal <140/90) -Controlled -Current treatment: Losartan 25 mg daily  Propanolol 10 mg three times daily  -Medications previously tried: NA  -Current home readings: NA -Denies hypotensive/hypertensive symptoms -Counseled to monitor BP at home weekly, document, and provide log at future appointments -Recommended to continue current medication  Hyperlipidemia: (LDL goal < 100) -Uncontrolled -Current treatment: Rosuvastatin 40 mg daily  -Medications previously tried: NA   Diabetes (A1c goal <8%) -Controlled -Current medications: Trulicity 0.75 mg weekly: Appropriate, Effective, Safe, Accessible -Medications previously tried: NA  -Current home glucose readings fasting glucose: has not been able to monitor due to not having necessary test strips or lancets. Patient unsure what glucometer she uses  -Reports hypoglycemic symptoms: NA  -Patient to report what glucometer she uses to clinical pharmacy team and I will send in refills.   Asthma with Allergic Rhinitis (Goal: control symptoms and prevent exacerbations) -Well Controlled -Current treatment  Albuterol HFA 1 puff every 6 hours as needed   Montelukast 10 mg nightly  Symbicort 160-4.5  mcg/act 2 puffs twice daily  -Current allergy treatment  Azelastine nasal spray twice daily  Xyzal 5 mg nightly  Ocean nasal spray  -Medications previously tried: Breo (ineffective), Trelegy (cost)  -Pulmonary function testing: FEV1 49%, 65% Post-BD. FEV1/FVC 76% (May 2023)  -Patient reports having both Trelegy and Symbicort at home it is unclear to me which one she has been using consistently. When asked she states that she feels her Symbicort has been working well for her and she would prefer to stay on that due to the cost.  -Patient reports  hoarse throat.  -Exacerbations requiring treatment in last 6 months: No -Frequency of rescue inhaler use: Has not needed in past month, but does not refill today. .  -If symptoms remain in excellent control could consider stepping down to medium dose ICS therapy.  -Continue current medications.   Patient Goals/Self-Care Activities Patient will:  - check glucose daily before breakfast, document, and provide at future appointments check blood pressure weekly, document, and provide at future appointments  Follow Up Plan: Telephone follow up appointment with care management team member scheduled for:  03/30/2022 at 1:45 PM      Patient agreed to services and verbal consent obtained.   Patient verbalizes understanding of instructions and care plan provided today and agrees to view in MyChart. Active MyChart status and patient understanding of how to access instructions and care plan via MyChart confirmed with patient.     Cheyenne Adas, CPP Clinical Pharmacist Practitioner  [facility] [phone]

## 2022-02-17 NOTE — Progress Notes (Signed)
Dr. Jayme Cloud,   Thank you for your response and apologies for the confusion on who started her on Trelegy. Please let me know if you decide to switch patient to [facility] in the future so I can be sure to update her patient assistance application.   Thank you for collaborating on the care for this patient,  Cheyenne Adas, CPP Clinical Pharmacist Practitioner  [facility] [phone]

## 2022-02-23 ENCOUNTER — Telehealth: Payer: Self-pay

## 2022-02-23 DIAGNOSIS — E0829 Diabetes mellitus due to underlying condition with other diabetic kidney complication: Secondary | ICD-10-CM

## 2022-02-23 NOTE — Progress Notes (Signed)
Per Clinical Pharmacist, reach out to patient to confirm what glucometer she has.  Patient states she is currently at the hospital with her husband and will return my call later this afternoon when she is home to inform me what Glucometer she has.   02/25/2022:  Patient states her meter is Acc Check Guide, and it does not have anything with it. Patient is asking if she can get another one sent to her pharmacy as she feels her is missing alot of items and that it is not working at all. Notified Clinical pharmacist.    Per Clinical pharmacist,[facility]  Patient verbalized understanding, and she is aware to return my call if she has any questions.  [name] Clinical Pharmacist Assistant [phone]

## 2022-02-25 MED ORDER — ACCU-CHEK GUIDE VI STRP: ORAL_STRIP | 12 refills | Status: DC

## 2022-02-25 MED ORDER — ACCU-CHEK SOFTCLIX LANCETS MISC: 12 refills | Status: DC

## 2022-03-17 ENCOUNTER — Encounter: Payer: Self-pay | Admitting: Nurse Practitioner

## 2022-03-17 ENCOUNTER — Telehealth (INDEPENDENT_AMBULATORY_CARE_PROVIDER_SITE_OTHER): Payer: Medicare HMO | Admitting: Nurse Practitioner

## 2022-03-17 ENCOUNTER — Other Ambulatory Visit: Payer: Self-pay

## 2022-03-17 DIAGNOSIS — G43019 Migraine without aura, intractable, without status migrainosus: Secondary | ICD-10-CM

## 2022-03-17 MED ORDER — SUMATRIPTAN SUCCINATE 25 MG PO TABS: 25.0000 mg | ORAL_TABLET | ORAL | 0 refills | Status: DC | PRN

## 2022-03-17 MED ORDER — PROMETHAZINE HCL 12.5 MG PO TABS: 12.5000 mg | ORAL_TABLET | Freq: Three times a day (TID) | ORAL | 0 refills | Status: DC | PRN

## 2022-03-17 NOTE — Progress Notes (Signed)
Name: Lori Potts   MRN: [id number]    DOB: Jul 10, 1946   Date:03/17/2022       Progress Note  Subjective  Chief Complaint  Chief Complaint  Patient presents with   Headache    I connected with  [name]  on 03/17/22 at  1:00 PM EDT by a video enabled telemedicine application and verified that I am speaking with the correct person using two identifiers.  I discussed the limitations of evaluation and management by telemedicine and the availability of in person appointments. The patient expressed understanding and agreed to proceed with a virtual visit  Staff also discussed with the patient that there may be a patient responsible charge related to this service. Patient Location: home Provider Location: [facility] Additional Individuals present: alone  HPI  Headache: Reports that she has had a migraine for days now.  Patient states she has tried over-the-counter medication and nothing seems to be helping.  Patient states it feels like her normal migraine.  Patient does report some nausea with it.  She has a history of migraines.  She was prescribed Emgality 120 mg monthly back in November but states she was unable to afford it. Patient states that she has been on current medications in the past which included Imitrex, Maxalt and propranolol.  States she remembers the Maxalt did not help at all.  We will send in a prescription for Imitrex and Phenergan to help.    Patient Active Problem List   Diagnosis Date Noted   Moderate persistent asthma without complication 02/16/2022   Neutropenia, unspecified type (HCC) 11/25/2020   Intractable migraine with aura without status migrainosus 04/18/2020   Body mass index (BMI) 31.0-31.9, adult 02/06/2020   Chronic left hip pain 12/26/2019   Anemia 09/22/2017   S/P knee replacement 07/28/2017   Allergic rhinitis 05/26/2017   Vitamin D deficiency 05/19/2017   Chronic nonintractable headache 05/18/2017   Advanced care planning/counseling  discussion    Gastroesophageal reflux disease    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HCC) 03/02/2017   Insomnia 11/29/2016   Chronic right-sided low back pain with right-sided sciatica 04/19/2016   Abdominal wall pain in right flank 03/29/2016   Diverticulosis 02/24/2016   Hypercholesterolemia 06/16/2015   Chronic pain 04/13/2015   Neck pain 02/26/2015   Status post lumbar surgery 02/26/2015   Abnormal ECG 01/27/2015   Angina pectoris (HCC) 01/27/2015   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) 01/27/2015   Essential (primary) hypertension 01/27/2015    Social History   Tobacco Use   Smoking status: Former    Types: Cigarettes    Quit date: 2006    Years since quitting: 17.5   Smokeless tobacco: Never   Tobacco comments:    quit smoking 50yrs ago  Substance Use Topics   Alcohol use: No     Current Outpatient Medications:    albuterol (VENTOLIN HFA) 108 (90 Base) MCG/ACT inhaler, Inhale 2 puffs into the lungs every 6 (six) hours as needed for wheezing or shortness of breath., Disp: 18 g, Rfl: 2   ascorbic acid (VITAMIN C) 500 MG tablet, Take 500 mg by mouth daily., Disp: , Rfl:    aspirin 81 MG EC tablet, Take 1 tablet by mouth daily., Disp: , Rfl:    azelastine (ASTELIN) 0.1 % nasal spray, PLACE 1 SPRAY INTO BOTH NOSTRILS 2 (TWO) TIMES DAILY. USE IN EACH NOSTRIL AS DIRECTED, Disp: 90 mL, Rfl: 1   budesonide-formoterol (SYMBICORT) 160-4.5 MCG/ACT inhaler, Inhale 2  puffs into the lungs 2 (two) times daily. Patient receives via [place] Patient Assistance., Disp: , Rfl:    citalopram (CELEXA) 40 MG tablet, Take 1 tablet (40 mg total) by mouth daily., Disp: 90 tablet, Rfl: 1   Dulaglutide (TRULICITY) 0.75 MG/0.5ML SOPN, Inject 0.75 mg into the skin once a week. Patient receives via [employer] Patient Assistance through Dec 2023, Disp: , Rfl:    esomeprazole (NEXIUM) 40 MG capsule, Take 1 capsule (40 mg total) by mouth daily. Follow up in office for more refills,  Disp: 90 capsule, Rfl: 1   fluticasone (FLONASE) 50 MCG/ACT nasal spray, PLACE 2 SPRAYS INTO BOTH NOSTRILS EVERY EVENING., Disp: 48 mL, Rfl: 1   levocetirizine (XYZAL) 5 MG tablet, Take 1 tablet (5 mg total) by mouth every evening., Disp: 30 tablet, Rfl: 2   losartan (COZAAR) 25 MG tablet, Take 1 tablet (25 mg total) by mouth daily., Disp: 90 tablet, Rfl: 3   methocarbamol (ROBAXIN) 500 MG tablet, Take 500 mg by mouth every 8 (eight) hours as needed for muscle spasms., Disp: , Rfl:    montelukast (SINGULAIR) 10 MG tablet, TAKE 1 TABLET BY MOUTH EVERYDAY AT BEDTIME, Disp: 90 tablet, Rfl: 3   Multiple Vitamins-Minerals (MULTIVITAMIN WITH MINERALS) tablet, Take 1 tablet by mouth daily., Disp: , Rfl:    Oxycodone HCl 10 MG TABS, , Disp: , Rfl:    promethazine (PHENERGAN) 12.5 MG tablet, Take 1 tablet (12.5 mg total) by mouth every 8 (eight) hours as needed for nausea or vomiting., Disp: 30 tablet, Rfl: 0   propranolol (INDERAL) 10 MG tablet, TAKE 1 TABLET BY MOUTH THREE TIMES A DAY, Disp: 270 tablet, Rfl: 3   rosuvastatin (CRESTOR) 40 MG tablet, TAKE 1 TABLET BY MOUTH EVERYDAY AT BEDTIME, Disp: 90 tablet, Rfl: 1   sodium chloride (OCEAN) 0.65 % SOLN nasal spray, Place 1 spray into both nostrils 2 (two) times daily as needed for congestion., Disp: 60 mL, Rfl: 5   SUMAtriptan (IMITREX) 25 MG tablet, Take 1 tablet (25 mg total) by mouth every 2 (two) hours as needed for migraine (ongoing headache). Maximum daily dose 200mg , Disp: 30 tablet, Rfl: 0  Allergies  Allergen Reactions   Metformin And Related Diarrhea   Adhesive [Tape] Rash and Other (See Comments)    Regular tape is ok, allergy is to paper tape    I personally reviewed active problem list, medication list, allergies, notes from last encounter with the patient/caregiver today.  ROS  Constitutional: Negative for fever or weight change.  Respiratory: Negative for cough and shortness of breath.   Cardiovascular: Negative for chest pain or  palpitations.  Gastrointestinal: Negative for abdominal pain, no bowel changes.  Musculoskeletal: Negative for gait problem or joint swelling.  Skin: Negative for rash.  Neurological: Negative for dizziness, positive for headache.  No other specific complaints in a complete review of systems (except as listed in HPI above).   Objective  Virtual encounter, vitals not obtained.  There is no height or weight on file to calculate BMI.  Nursing Note and Vital Signs reviewed.  Physical Exam  Awake, alert and oriented speaking in complete sentences  No results found for this or any previous visit (from the past 72 hour(s)).  Assessment & Plan  1. Intractable migraine without aura and without status migrainosus Comments: She reports this is her typical migraine.  We will send in prescription for Imitrex and Phenergan.  Discussed increasing fluid intake. - SUMAtriptan (IMITREX) 25 MG tablet; Take 1  tablet (25 mg total) by mouth every 2 (two) hours as needed for migraine (ongoing headache). Maximum daily dose 200mg   Dispense: 30 tablet; Refill: 0 - promethazine (PHENERGAN) 12.5 MG tablet; Take 1 tablet (12.5 mg total) by mouth every 8 (eight) hours as needed for nausea or vomiting.  Dispense: 30 tablet; Refill: 0    -Red flags and when to present for emergency care or RTC including fever >101.36F, chest pain, shortness of breath, new/worsening/un-resolving symptoms,  reviewed with patient at time of visit. Follow up and care instructions discussed and provided in AVS. - I discussed the assessment and treatment plan with the patient. The patient was provided an opportunity to ask questions and all were answered. The patient agreed with the plan and demonstrated an understanding of the instructions.  I provided 15 minutes of non-face-to-face time during this encounter.  , [name]

## 2022-03-21 DIAGNOSIS — H01114 Allergic dermatitis of left upper eyelid: Secondary | ICD-10-CM | POA: Diagnosis not present

## 2022-03-29 ENCOUNTER — Telehealth: Payer: Self-pay

## 2022-03-29 NOTE — Progress Notes (Signed)
Chronic Care Management APPOINTMENT REMINDER   Lori Potts was reminded to have all medications, supplements and any blood glucose and blood pressure readings available for review with [name], Pharm. D, at her telephone visit on 03/30/2022 at 1:45 pm.  Patient confirm appointment.  [name] Clinical Pharmacist Assistant [phone]

## 2022-03-30 ENCOUNTER — Ambulatory Visit: Payer: Medicare HMO

## 2022-03-30 DIAGNOSIS — G43119 Migraine with aura, intractable, without status migrainosus: Secondary | ICD-10-CM

## 2022-03-30 DIAGNOSIS — I10 Essential (primary) hypertension: Secondary | ICD-10-CM

## 2022-03-30 NOTE — Progress Notes (Unsigned)
Chronic Care Management Pharmacy Note  03/30/2022 Name:  LEKIA NIER MRN:  [id number] DOB:  23-Jan-1946  Summary: Patient presents for CCM follow-up.   -Patient reports migraines every day. Minimal relief with sumatriptan. She had previously had success with Emgality injections, but was not able to afford to stay on the treatment.      Recommendations/Changes made from today's visit: -After discussion with PCP, following changes will be made:  Will attempt PAP to restart Emgality. Rx sent into [facility] Propranolol to 20 mg three times daily   Plan: CPP follow-up in 3 months  Subjective: BRECKYN TROYER is an 76 y.o. year old female who is a primary patient of Bo Merino, [name].  The CCM team was consulted for assistance with disease management and care coordination needs.    Engaged with patient by telephone for follow up visit in response to provider referral for pharmacy case management and/or care coordination services.   Consent to Services:  The patient was given information about Chronic Care Management services, agreed to services, and gave verbal consent prior to initiation of services.  Please see initial visit note for detailed documentation.   Patient Care Team: Bo Merino, [name] as PCP - General (Nurse Practitioner) Germaine Pomfret, [facility] (Pharmacist) Suella Broad, MD as Consulting Physician (Physical Medicine and Rehabilitation) Tyler Pita, MD as Consulting Physician (Pulmonary Disease) Lovell Sheehan, MD as Consulting Physician (Orthopedic Surgery)  Recent office visits: 03/17/22: Patient presented to Serafina Royals, [name] for migraine. Promethazine, sumatriptan.  01/28/22: Patient presented to Serafina Royals, [name] for acute sinusitis. Azithromycin.  11/22/21: Patient presented to Dr. Rosana Berger for acute labyrinthitis. Methylprednisolone x 7 days.  10/20/21: Patient presented to Serafina Royals, [name] for viral URI.   Recent consult  visits: 11/11/21: Patient presented to Dr. Patsey Berthold (Pulmonology)  Hospital visits: None in previous 6 months  Objective:  Lab Results  Component Value Date   CREATININE 0.81 07/08/2021   BUN 18 07/08/2021   GFRNONAA >60 09/14/2020   GFRAA 101 02/18/2020   NA 143 07/08/2021   K 4.1 07/08/2021   CALCIUM 10.1 07/08/2021   CO2 28 07/08/2021   GLUCOSE 103 (H) 07/08/2021    Lab Results  Component Value Date/Time   HGBA1C 6.9 (H) 07/08/2021 03:15 PM   HGBA1C 7.2 (A) 11/25/2020 08:04 AM   HGBA1C 7.2 (H) 11/14/2019 12:06 PM   HGBA1C 6.6 03/31/2019 12:00 AM   MICROALBUR 5.2 05/14/2019 12:00 AM   MICROALBUR 3.8 05/09/2018 09:50 AM   MICROALBUR 20 07/04/2016 10:36 AM    Last diabetic Eye exam:  Lab Results  Component Value Date/Time   HMDIABEYEEXA No Retinopathy 06/02/2020 12:00 AM    Last diabetic Foot exam: No results found for: "HMDIABFOOTEX"   Lab Results  Component Value Date   CHOL 163 07/08/2021   HDL 53 07/08/2021   LDLCALC 90 07/08/2021   TRIG 102 07/08/2021   CHOLHDL 3.1 07/08/2021       Latest Ref Rng & Units 07/08/2021    3:15 PM 11/25/2020    8:39 AM 02/18/2020    9:37 AM  Hepatic Function  Total Protein 6.1 - 8.1 g/dL 7.3  7.2  6.6   AST 10 - 35 U/L $Remo'23  19  17   'MSpjB$ ALT 6 - 29 U/L $Remo'14  17  16   'IXOtf$ Total Bilirubin 0.2 - 1.2 mg/dL 0.4  0.4  0.5     Lab Results  Component Value Date/Time   TSH  2.00 02/18/2020 09:37 AM   TSH 0.71 11/14/2019 12:06 PM   FREET4 1.1 02/18/2020 09:37 AM       Latest Ref Rng & Units 07/08/2021    3:15 PM 01/19/2021   12:00 AM 09/14/2020    9:37 AM  CBC  WBC 3.8 - 10.8 Thousand/uL 4.9  5.9  5.3   Hemoglobin 11.7 - 15.5 g/dL 12.5  11.7  13.0   Hematocrit 35.0 - 45.0 % 38.5  36.3  39.3   Platelets 140 - 400 Thousand/uL 441  531  414     Lab Results  Component Value Date/Time   VD25OH 24 (L) 05/09/2018 09:50 AM   VD25OH 25 (L) 09/11/2017 02:09 PM    Clinical ASCVD: No  The 10-year ASCVD risk score (Arnett DK, et al., 2019)  is: 32.6%   Values used to calculate the score:     Age: 76 years     Sex: Female     Is Non-Hispanic African American: Yes     Diabetic: Yes     Tobacco smoker: No     Systolic Blood Pressure: 970 mmHg     Is BP treated: Yes     HDL Cholesterol: 53 mg/dL     Total Cholesterol: 163 mg/dL       03/17/2022   11:07 AM 01/28/2022   11:47 AM 12/17/2021    9:26 AM  Depression screen PHQ 2/9  Decreased Interest 0 0 0  Down, Depressed, Hopeless 0 0 0  PHQ - 2 Score 0 0 0     Social History   Tobacco Use  Smoking Status Former   Types: Cigarettes   Quit date: 2006   Years since quitting: 17.6  Smokeless Tobacco Never  Tobacco Comments   quit smoking 62yrs ago   BP Readings from Last 3 Encounters:  11/22/21 (!) 142/80  11/11/21 126/80  07/08/21 128/76   Pulse Readings from Last 3 Encounters:  11/22/21 73  11/11/21 65  07/08/21 82   Wt Readings from Last 3 Encounters:  11/22/21 188 lb (85.3 kg)  11/11/21 186 lb 12.8 oz (84.7 kg)  07/08/21 183 lb 3.2 oz (83.1 kg)   BMI Readings from Last 3 Encounters:  11/22/21 32.27 kg/m  11/11/21 32.57 kg/m  07/08/21 31.45 kg/m    Assessment/Interventions: Review of patient past medical history, allergies, medications, health status, including review of consultants reports, laboratory and other test data, was performed as part of comprehensive evaluation and provision of chronic care management services.   SDOH:  (Social Determinants of Health) assessments and interventions performed: Yes    SDOH Screenings   Alcohol Screen: Low Risk  (03/17/2022)   Alcohol Screen    Last Alcohol Screening Score (AUDIT): 0  Depression (PHQ2-9): Low Risk  (03/17/2022)   Depression (PHQ2-9)    PHQ-2 Score: 0  Financial Resource Strain: Medium Risk (11/24/2021)   Overall Financial Resource Strain (CARDIA)    Difficulty of Paying Living Expenses: Somewhat hard  Food Insecurity: No Food Insecurity (11/18/2021)   Hunger Vital Sign    Worried About  Running Out of Food in the Last Year: Never true    Ran Out of Food in the Last Year: Never true  Housing: Low Risk  (11/18/2021)   Housing    Last Housing Risk Score: 0  Physical Activity: Inactive (11/18/2021)   Exercise Vital Sign    Days of Exercise per Week: 0 days    Minutes of Exercise per Session: 0 min  Social  Connections: Socially Integrated (11/18/2021)   Social Connection and Isolation Panel [NHANES]    Frequency of Communication with Friends and Family: More than three times a week    Frequency of Social Gatherings with Friends and Family: Twice a week    Attends Religious Services: More than 4 times per year    Active Member of [employer] or Organizations: Yes    Attends Archivist Meetings: 1 to 4 times per year    Marital Status: Married  Stress: No Stress Concern Present (11/18/2021)   [place]    Feeling of Stress : Not at all  Tobacco Use: Medium Risk (03/17/2022)   Patient History    Smoking Tobacco Use: Former    Smokeless Tobacco Use: Never    Passive Exposure: Not on file  Transportation Needs: No Transportation Needs (11/18/2021)   PRAPARE - Hydrologist (Medical): No    Lack of Transportation (Non-Medical): No    CCM Care Plan  Allergies  Allergen Reactions   Metformin And Related Diarrhea   Adhesive [Tape] Rash and Other (See Comments)    Regular tape is ok, allergy is to paper tape    Medications Reviewed Today     Reviewed by Hollie Salk, RMA (Registered Medical Assistant) on 03/17/22 at 1106  Med List Status: <None>   Medication Order Taking? Sig Documenting Provider Last Dose Status Informant  Accu-Chek Softclix Lancets lancets [id number] No Use to monitor blood sugar daily as instructed  Patient not taking: Reported on 03/17/2022   Bo Merino, [name] Not Taking Active   albuterol (VENTOLIN HFA) 108 (90 Base) MCG/ACT inhaler [id number] Yes  Inhale 2 puffs into the lungs every 6 (six) hours as needed for wheezing or shortness of breath. Bo Merino, [name] Taking Active   ascorbic acid (VITAMIN C) 500 MG tablet [id number] Yes Take 500 mg by mouth daily. [provider] Taking Active Self  aspirin 81 MG EC tablet [id number] Yes Take 1 tablet by mouth daily. [provider] Taking Active   azelastine (ASTELIN) 0.1 % nasal spray [id number] Yes PLACE 1 SPRAY INTO BOTH NOSTRILS 2 (TWO) TIMES DAILY. USE IN EACH NOSTRIL AS DIRECTED Steele Sizer, MD Taking Active   Blood Glucose Monitoring Suppl (ACCU-CHEK GUIDE ME) w/Device KIT [id number] No DISPENSE BASED ON PATIENT AND INSURANCE PREFERENCE. USE UP TO FOUR TIMES DAILY AS DIRECTED. (FOR ICD-9 250.00, 250.01).  Patient not taking: Reported on 01/28/2022   Bo Merino, [name] Not Taking Active   budesonide-formoterol [facility]) 160-4.5 MCG/ACT inhaler [id number] Yes Inhale 2 puffs into the lungs 2 (two) times daily. Patient receives via [employer] Patient Assistance. Bo Merino, [name] Taking Active   citalopram (CELEXA) 40 MG tablet [id number] Yes Take 1 tablet (40 mg total) by mouth daily. Bo Merino, [name] Taking Active   Dulaglutide (TRULICITY) 0.86 PY/1.9JK [employer] [id number] Yes Inject 0.75 mg into the skin once a week. Patient receives via [employer] Patient Assistance through Dec 2023 Bo Merino, [name] Taking Active   esomeprazole (NEXIUM) 40 MG capsule [id number] Yes Take 1 capsule (40 mg total) by mouth daily. Follow up in office for more refills Bo Merino, [name] Taking Active   fluticasone (FLONASE) 50 MCG/ACT nasal spray [id number] Yes PLACE 2 SPRAYS INTO BOTH NOSTRILS EVERY EVENING. Steele Sizer, MD Taking Active   Galcanezumab-gnlm [facility]) 120 MG/ML Darden Palmer [id number] Yes Inject 120 mg into the skin every 30 (thirty) days.  Bo Merino, [name] Taking Active   glucose blood (ACCU-CHEK GUIDE) test strip [id number] No Use to monitor blood sugar daily as instructed   Patient not taking: Reported on 03/17/2022   Bo Merino, [name] Not Taking Active   levocetirizine (XYZAL) 5 MG tablet [id number] Yes Take 1 tablet (5 mg total) by mouth every evening. Hubbard Hartshorn, [name] Taking Active   losartan (COZAAR) 25 MG tablet [id number] Yes Take 1 tablet (25 mg total) by mouth daily. Bo Merino, [name] Taking Active   methocarbamol (ROBAXIN) 500 MG tablet [id number] Yes Take 500 mg by mouth every 8 (eight) hours as needed for muscle spasms. [provider] Taking Active   montelukast (SINGULAIR) 10 MG tablet [id number] Yes TAKE 1 TABLET BY MOUTH EVERYDAY AT BEDTIME Bo Merino, [name] Taking Active   Multiple Vitamins-Minerals (MULTIVITAMIN WITH MINERALS) tablet [id number] Yes Take 1 tablet by mouth daily. [provider] Taking Active Self  Oxycodone HCl 10 MG TABS [id number] Yes  [provider] Taking Active   promethazine-dextromethorphan (PROMETHAZINE-DM) 6.25-15 MG/5ML syrup [id number] No Take 5 mLs by mouth 4 (four) times daily as needed for cough.  Patient not taking: Reported on 03/17/2022   Bo Merino, [name] Not Taking Active   propranolol (INDERAL) 10 MG tablet [id number] Yes TAKE 1 TABLET BY MOUTH THREE TIMES A DAY Bo Merino, [name] Taking Active   rosuvastatin (CRESTOR) 40 MG tablet [id number] Yes TAKE 1 TABLET BY MOUTH EVERYDAY AT BEDTIME Bo Merino, [name] Taking Active   sodium chloride (OCEAN) 0.65 % SOLN nasal spray [id number] Yes Place 1 spray into both nostrils 2 (two) times daily as needed for congestion. Fredderick Severance, NP Taking Active Self            Patient Active Problem List   Diagnosis Date Noted   Moderate persistent asthma without complication 11/57/2620   Neutropenia, unspecified type ([place]) 11/25/2020   Intractable migraine with aura without status migrainosus 04/18/2020   Body mass index (BMI) 31.0-31.9, adult 02/06/2020   Chronic left hip pain 12/26/2019   Anemia 09/22/2017   S/P knee  replacement 07/28/2017   Allergic rhinitis 05/26/2017   Vitamin D deficiency 05/19/2017   Chronic nonintractable headache 05/18/2017   Advanced care planning/counseling discussion    Gastroesophageal reflux disease    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[place]) 03/02/2017   Insomnia 11/29/2016   Chronic right-sided low back pain with right-sided sciatica 04/19/2016   Abdominal wall pain in right flank 03/29/2016   Diverticulosis 02/24/2016   Hypercholesterolemia 06/16/2015   Chronic pain 04/13/2015   Neck pain 02/26/2015   Status post lumbar surgery 02/26/2015   Abnormal ECG 01/27/2015   Angina pectoris ([place]) 01/27/2015   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[place]) 01/27/2015   Essential (primary) hypertension 01/27/2015    Immunization History  Administered Date(s) Administered   Fluad Quad(high Dose 65+) 05/08/2019, 06/07/2021   Influenza, High Dose Seasonal PF 06/10/2015, 06/20/2016, 05/09/2017, 05/09/2018, 04/17/2020   PFIZER(Purple Top)SARS-COV-2 Vaccination 09/11/2019, 10/02/2019, 06/08/2020, 12/29/2020   PNEUMOCOCCAL CONJUGATE-20 06/07/2021   Pfizer Covid-19 Vaccine Bivalent Booster 63yrs & up 07/14/2021   Pneumococcal Conjugate-13 11/28/2013   Pneumococcal Polysaccharide-23 03/16/2018   Tdap 05/08/2019    Conditions to be addressed/monitored:  Hypertension, Hyperlipidemia, Diabetes, Coronary Artery Disease, GERD, and Allergic Rhinitis  There are no care plans that you recently modified to display for this patient.    Medication Assistance: Application for Emgality  medication assistance program. in process.  Anticipated assistance  start date TBD.  See plan of care for additional detail. Trulicity obtained through [place] cares medication assistance program.  Enrollment ends Dec 2023 Symbicort obtained through [place] cares medication assistance program. Enrollment ends Dec 2023  Compliance/Adherence/Medication fill history: Care  Gaps: Mammogram  [facility]: Rosuvastatin 40 mg last filled on 09/25/21 for [facility]Day supply [facility] Losartan 25 mg last filled on 09/15/2020 for a 90-Day supply with [facility]  Patient's preferred pharmacy is:  [id number] [place] [place] [address] Phone: [phone] Fax: [phone]  [place] [facility] [place], [place] [address] Phone: [phone] Fax: [phone]  [facility] ([place], [place] [address] [place] [address] Phone: [phone] Fax: [phone]  [id number] [place], [place] [address] Phone: [phone] Fax: [place], [place] [address] Phone: [phone] Fax: [phone]   Uses pill box? Yes Pt endorses 100% compliance  We discussed: Current pharmacy is preferred with insurance plan and patient is satisfied with pharmacy services Patient decided to: Continue current medication management strategy  Care Plan and Follow Up Patient Decision:  Patient agrees to Care Plan and Follow-up.  Plan: Telephone follow up appointment with care management team member scheduled for:  07/06/22 at 3:45 PM  Malva Limes, [facility] [phone]

## 2022-03-31 ENCOUNTER — Telehealth: Payer: Self-pay

## 2022-03-31 MED ORDER — PROPRANOLOL HCL 20 MG PO TABS: 20.0000 mg | ORAL_TABLET | Freq: Three times a day (TID) | ORAL | 3 refills | Status: DC

## 2022-03-31 NOTE — Telephone Encounter (Signed)
Lori Potts with [facility], asking for clarification on Emgality prescription - "pens of pre-filled syringes." Please advise.

## 2022-03-31 NOTE — Patient Instructions (Signed)
Visit Information It was great speaking with you today!  Please let me know if you have any questions about our visit.   Goals Addressed             This Visit's Progress    Track and Manage My Blood Pressure-Hypertension       Timeframe:  Long-Range Goal Priority:  High Start Date: 03/31/22                            Expected End Date: 04/01/23                      Follow Up within 30 days   - check blood pressure 3 times per week    Why is this important?   You won't feel high blood pressure, but it can still hurt your blood vessels.  High blood pressure can cause heart or kidney problems. It can also cause a stroke.  Making lifestyle changes like losing a little weight or eating less salt will help.  Checking your blood pressure at home and at different times of the day can help to control blood pressure.  If the doctor prescribes medicine remember to take it the way the doctor ordered.  Call the office if you cannot afford the medicine or if there are questions about it.     Notes:         Patient Care Plan: General Pharmacy (Adult)     Problem Identified: Hypertension, Hyperlipidemia, Diabetes, Coronary Artery Disease, GERD, and Allergic Rhinitis   Priority: High     Long-Range Goal: Patient-Specific Goal   Start Date: 03/18/2021  Expected End Date: 02/17/2023  This Visit's Progress: On track  Recent Progress: On track  Priority: High  Note:   Current Barriers:  Unable to independently afford treatment regimen Unable to achieve control of cholesterol   Pharmacist Clinical Goal(s):  Patient will achieve control of cholesterol as evidenced by LDL less than 100 maintain control of diabetes as evidenced by A1c less than 8%  through collaboration with PharmD and provider.   Interventions: 1:1 collaboration with [name], [name] regarding development and update of comprehensive plan of care as evidenced by provider attestation and  co-signature Inter-disciplinary care team collaboration (see longitudinal plan of care) Comprehensive medication review performed; medication list updated in electronic medical record  Hypertension (BP goal <140/90) -Controlled -Current treatment: Losartan 25 mg daily  Propanolol 10 mg three times daily  -Medications previously tried: NA  -Current home readings: 132/60, 128/70  -Denies hypotensive/hypertensive symptoms -INCREASE Propranolol to 20 mg three times daily for migraine prevention  Hyperlipidemia: (LDL goal < 100) -Uncontrolled -Current treatment: Rosuvastatin 40 mg daily  -Medications previously tried: NA  -Continue current medications  Diabetes (A1c goal <8%) -Controlled -Current medications: Trulicity 0.75 mg weekly: Appropriate, Effective, Safe, Accessible -Medications previously tried: NA  -Current home glucose readings fasting glucose: has not been able to monitor due to not having necessary test strips or lancets. Patient unsure what glucometer she uses  -Reports hypoglycemic symptoms: NA  -Continue current medications  Asthma with Allergic Rhinitis (Goal: control symptoms and prevent exacerbations) -Controlled -Current treatment  Albuterol HFA 1 puff every 6 hours as needed   Montelukast 10 mg nightly  Symbicort 160-4.5 mcg/act 2 puffs twice daily  -Current allergy treatment  Azelastine nasal spray twice daily  Xyzal 5 mg nightly  Ocean nasal spray  -Medications previously tried: Engineer, materials (ineffective),  Trelegy (cost)  -Pulmonary function testing: FEV1 49%, 65% Post-BD. FEV1/FVC 76% (May 2023)  -Exacerbations requiring treatment in last 6 months: No -Continue current medications  Chronic Migraines (Goal: Reduce frequency and severity of migraines) -Uncontrolled -Current treatment  Propranolol 10 mg three times daily Sumatriptan 25 mg as needed   -Medications previously tried: Merchant [employer] officer (Cost)  -Patient reports migraines every day. Minimal relief with  sumatriptan. She had previously had success with Emgality injections, but was not able to afford to stay on the treatment.   -After discussion with PCP, following changes will be made:  Will attempt PAP to restart Emgality. Rx sent into [facility] INCREASE Propranolol to 20 mg three times daily   Patient Goals/Self-Care Activities Patient will:  - check glucose daily before breakfast, document, and provide at future appointments check blood pressure weekly, document, and provide at future appointments  Follow Up Plan: Telephone follow up appointment with care management team member scheduled for:  07/06/22 at 3:45 PM    Patient agreed to services and verbal consent obtained.   Patient verbalizes understanding of instructions and care plan provided today and agrees to view in MyChart. Active MyChart status and patient understanding of how to access instructions and care plan via MyChart confirmed with patient.     Cheyenne Adas, CPP Clinical Pharmacist Practitioner  [facility] [phone]

## 2022-03-31 NOTE — Progress Notes (Signed)
Per clinical pharmacist, please let her know that I spoke with Raynelle Fanning and we were in agreement regarding the following plan to try and help with her migraines:    INCREASE Propranolol to 20 mg three times daily. Refill sent in to [facility].   We are going to see if she qualifies for patient assistance for Emgality so she can resumes the once monthly injections for migraine prevention.     Please ask her to continue monitoring and recording her blood pressure 2-3 times weekly.   Patient verbalized understanding, and she is aware I will be following up in one week regarding the Emgality.  [name] Clinical Pharmacist Assistant [phone]

## 2022-04-07 ENCOUNTER — Encounter: Payer: Self-pay | Admitting: Nurse Practitioner

## 2022-04-07 ENCOUNTER — Ambulatory Visit (INDEPENDENT_AMBULATORY_CARE_PROVIDER_SITE_OTHER): Payer: Medicare HMO | Admitting: Nurse Practitioner

## 2022-04-07 ENCOUNTER — Other Ambulatory Visit: Payer: Self-pay

## 2022-04-07 VITALS — BP 132/80 | HR 88 | Temp 97.8°F | Resp 16 | Ht 64.0 in | Wt 186.5 lb

## 2022-04-07 DIAGNOSIS — R0602 Shortness of breath: Secondary | ICD-10-CM | POA: Insufficient documentation

## 2022-04-07 DIAGNOSIS — E785 Hyperlipidemia, unspecified: Secondary | ICD-10-CM

## 2022-04-07 DIAGNOSIS — F330 Major depressive disorder, recurrent, mild: Secondary | ICD-10-CM | POA: Insufficient documentation

## 2022-04-07 DIAGNOSIS — I10 Essential (primary) hypertension: Secondary | ICD-10-CM

## 2022-04-07 DIAGNOSIS — R002 Palpitations: Secondary | ICD-10-CM | POA: Diagnosis not present

## 2022-04-07 DIAGNOSIS — K219 Gastro-esophageal reflux disease without esophagitis: Secondary | ICD-10-CM

## 2022-04-07 DIAGNOSIS — E1129 Type 2 diabetes mellitus with other diabetic kidney complication: Secondary | ICD-10-CM

## 2022-04-07 DIAGNOSIS — R809 Proteinuria, unspecified: Secondary | ICD-10-CM

## 2022-04-07 DIAGNOSIS — E1169 Type 2 diabetes mellitus with other specified complication: Secondary | ICD-10-CM | POA: Diagnosis not present

## 2022-04-07 DIAGNOSIS — F419 Anxiety disorder, unspecified: Secondary | ICD-10-CM | POA: Diagnosis not present

## 2022-04-07 MED ORDER — CITALOPRAM HYDROBROMIDE 40 MG PO TABS: 40.0000 mg | ORAL_TABLET | Freq: Every day | ORAL | 1 refills | Status: DC

## 2022-04-07 MED ORDER — ESOMEPRAZOLE MAGNESIUM 40 MG PO CPDR: 40.0000 mg | DELAYED_RELEASE_CAPSULE | Freq: Every day | ORAL | 1 refills | Status: DC

## 2022-04-07 MED ORDER — BUSPIRONE HCL 5 MG PO TABS: 5.0000 mg | ORAL_TABLET | Freq: Three times a day (TID) | ORAL | 0 refills | Status: DC | PRN

## 2022-04-07 NOTE — Assessment & Plan Note (Signed)
Continue taking Nexium 40 mg daily.  Try and avoid food triggers.

## 2022-04-07 NOTE — Assessment & Plan Note (Signed)
Continue taking rosuvastatin 40 mg daily.  Try and reduce saturated fats in your diet

## 2022-04-07 NOTE — Assessment & Plan Note (Signed)
She reports increased anxiety and depression.  Patient is currently taking Celexa 40 mg daily.  We will add on BuSpar 5 mg 3 times a day as needed.

## 2022-04-07 NOTE — Progress Notes (Signed)
BP 132/80   Pulse 88   Temp 97.8 F (36.6 C) (Oral)   Resp 16   Ht 5\' 4"  (1.626 m)   Wt 186 lb 8 oz (84.6 kg)   SpO2 93%   BMI 32.01 kg/m    Subjective:    Patient ID: , female    DOB: 1946-08-14, 76 y.o.   MRN: [id number]  HPI: Lori Potts is a 76 y.o. female  Chief Complaint  Patient presents with   Shortness of Breath   Anxiety   Allergic Rhinitis    Shortness of breath/palpitations: She says that she has noticed some exertional shortness of breath.  She says she noticed it started on Tuesday. She denies any illness, no cough. No shortness of breath at rest.  She denies any chest pain, but reports palpitations.  She is currently on propranolol 20 mg 3 times a day for migraines. She is using her Symbicort two times a day. Will get walking pulse oximetry, ekg and refer back to pulmonology and cardiology.  Patient was to follow up with pulmonology 6-8 weeks after last appointment but she did not.   Her EKG showed NSR.  Walking pulse ox lowest reading was 92 %. Patient had an echo on 11/22/2021 which showed :  Left Ventricle: Left ventricular ejection fraction, by estimation, is 60  to 65%. The left ventricle has normal function. The left ventricle has no  regional wall motion abnormalities. The average left ventricular global  longitudinal strain is -17.6 %.  The left ventricular internal cavity size was normal in size. There is  mild left ventricular hypertrophy. Left ventricular diastolic parameters  are consistent with Grade I diastolic dysfunction (impaired relaxation).   Right Ventricle: The right ventricular size is normal. No increase in  right ventricular wall thickness. Right ventricular systolic function is  normal.   Left Atrium: Left atrial size was normal in size.   Right Atrium: Right atrial size was normal in size.   Pericardium: There is no evidence of pericardial effusion.   Mitral Valve: The mitral valve is normal in structure. Mild  mitral annular  calcification. No evidence of mitral valve regurgitation.   Tricuspid Valve: The tricuspid valve is normal in structure. Tricuspid  valve regurgitation is trivial.   Aortic Valve: The aortic valve is tricuspid. Aortic valve regurgitation is  not visualized. Aortic valve sclerosis is present, with no evidence of  aortic valve stenosis. Aortic valve mean gradient measures 5.0 mmHg.  Aortic valve peak gradient measures 7.8   mmHg. Aortic valve area, by VTI measures 1.98 cm.   Pulmonic Valve: The pulmonic valve was normal in structure. Pulmonic valve  regurgitation is not visualized.   Aorta: The aortic root and ascending aorta are structurally normal, with  no evidence of dilitation.   Venous: The inferior vena cava is normal in size with greater than 50%  respiratory variability, suggesting right atrial pressure of 3 mmHg.   IAS/Shunts: No atrial level shunt detected by color flow Doppler.   Anxiety/depression: She is currently taking citalopram 40 mg daily. She says her stress has been really increased lately with her family.  She is caring for her husband who is not in the best health.  Discussed adding buspar three times a day as need.  Patient would like to try that.     04/07/2022   12:53 PM 03/17/2022   11:07 AM 01/28/2022   11:47 AM 12/17/2021    9:26 AM 11/22/2021  2:51 PM  Depression screen PHQ 2/9  Decreased Interest 0 0 0 0 0  Down, Depressed, Hopeless 0 0 0 0 0  PHQ - 2 Score 0 0 0 0 0  Altered sleeping 2    0  Tired, decreased energy 2    0  Change in appetite 2    0  Feeling bad or failure about yourself  0    0  Trouble concentrating 0    0  Moving slowly or fidgety/restless 0    0  Suicidal thoughts 0    0  PHQ-9 Score 6    0  Difficult doing work/chores     Not difficult at all       04/07/2022   12:54 PM 09/13/2021   11:11 AM 08/24/2021   11:44 AM 08/13/2021    2:07 PM  GAD 7 : Generalized Anxiety Score  Nervous, Anxious, on Edge 3 0 0 1   Control/stop worrying 3 0 0 0  Worry too much - different things 3 0 0 0  Trouble relaxing 3 0 0 0  Restless 3 0 0 0  Easily annoyed or irritable 3 0 0 0  Afraid - awful might happen 3 0 0 0  Total GAD 7 Score 21 0 0 1  Anxiety Difficulty Very difficult Not difficult at all Not difficult at all Not difficult at all    Diabetes:  She says her blood sugar has been running 120-130. She is currently taking trulicity [id number] mg weekly. Her last A1C was 6.9 on 07/08/2021.  Patient denies any polyuria, polydipsia or polyphagia.   Hyperlipidemia: rosuvastatin 40 mg daily. She denies any myalgia. Her last LDL was 90 on 07/08/2021.  HTN: She Is currently taking losartan 25 mg.  Blood pressure today is 132/80.  Patient denies any chest pain, headaches or blurred vision.  GERD: Patient reports that she is taking Nexium 40 mg daily.  Patient states this helps with her acid reflux.  Relevant past medical, surgical, family and social history reviewed and updated as indicated. Interim medical history since our last visit reviewed. Allergies and medications reviewed and updated.  Review of Systems  Constitutional: Negative for fever or weight change.  Respiratory: Negative for cough and positive for shortness of breath.   Cardiovascular: Negative for chest pain, positive palpitations.  Gastrointestinal: Negative for abdominal pain, no bowel changes.  Musculoskeletal: Negative for gait problem or joint swelling.  Skin: Negative for rash.  Neurological: Negative for dizziness or headache.  No other specific complaints in a complete review of systems (except as listed in HPI above).     Objective:    BP 132/80   Pulse 88   Temp 97.8 F (36.6 C) (Oral)   Resp 16   Ht 5\' 4"  (1.626 m)   Wt 186 lb 8 oz (84.6 kg)   SpO2 93%   BMI 32.01 kg/m   Wt Readings from Last 3 Encounters:  04/07/22 186 lb 8 oz (84.6 kg)  11/22/21 188 lb (85.3 kg)  11/11/21 186 lb 12.8 oz (84.7 kg)    Physical  Exam  Constitutional: Patient appears well-developed and well-nourished. Obese  No distress.  HEENT: head atraumatic, normocephalic, pupils equal and reactive to light, neck supple Cardiovascular: Normal rate, regular rhythm and normal heart sounds.  No murmur heard. No BLE edema. Pulmonary/Chest: Effort normal and breath sounds normal. No respiratory distress. Abdominal: Soft.  There is no tenderness. Psychiatric: Patient has a normal mood and affect. behavior  is normal. Judgment and thought content normal.  Results for orders placed or performed in visit on 12/22/21  Pulmonary Function Test [facility] Only  Result Value Ref Range   FVC-Pre 1.09 L   FVC-%Pred-Pre 49 %   FVC-Post 1.51 L   FVC-%Pred-Post 68 %   FVC-%Change-Post 38 %   FEV1-Pre 0.85 L   FEV1-%Pred-Pre 49 %   FEV1-Post 1.11 L   FEV1-%Pred-Post 65 %   FEV1-%Change-Post 30 %   FEV6-Pre 1.09 L   FEV6-%Pred-Pre 51 %   FEV6-Post 1.51 L   FEV6-%Pred-Post 71 %   FEV6-%Change-Post 38 %   Pre FEV1/FVC ratio 78 %   FEV1FVC-%Pred-Pre 101 %   Post FEV1/FVC ratio 73 %   FEV1FVC-%Change-Post -5 %   Pre FEV6/FVC Ratio 100 %   FEV6FVC-%Pred-Pre 104 %   Post FEV6/FVC ratio 100 %   FEV6FVC-%Pred-Post 104 %   FEF 25-75 Pre 0.76 L/sec   FEF2575-%Pred-Pre 50 %   FEF 25-75 Post 1.34 L/sec   FEF2575-%Pred-Post 89 %   FEF2575-%Change-Post 76 %   RV 2.06 L   RV % pred 90 %   TLC 4.09 L   TLC % pred 80 %   DLCO unc 13.82 ml/min/mmHg   DLCO unc % pred 72 %   DL/VA 6.16 ml/min/mmHg/L   DL/VA % pred 88 %      Assessment & Plan:   Problem List Items Addressed This Visit       Cardiovascular and Mediastinum   Essential (primary) hypertension    Blood pressure 132/80 today.  Continue taking losartan 25 mg daily.  Is also on atenolol 20 mg 3 times a day for migraines.      Relevant Medications   atorvastatin (LIPITOR) 80 MG tablet     Digestive   Gastroesophageal reflux disease    Continue taking Nexium 40 mg daily.  Try and  avoid food triggers.      Relevant Medications   esomeprazole (NEXIUM) 40 MG capsule     Endocrine   Type 2 diabetes mellitus with microalbuminuria, without long-term current use of insulin (HCC)    She is currently taking Trulicity 0.75 mg weekly.  She says her blood sugars been running 1 20-1 30.  We will get labs today.      Relevant Medications   atorvastatin (LIPITOR) 80 MG tablet   Other Relevant Orders   Hemoglobin A1c   Hyperlipidemia associated with type 2 diabetes mellitus (HCC)    Continue taking rosuvastatin 40 mg daily.  Try and reduce saturated fats in your diet      Relevant Medications   atorvastatin (LIPITOR) 80 MG tablet   Other Relevant Orders   Lipid panel     Other   Anxiety    She reports increased anxiety and depression.  Patient is currently taking Celexa 40 mg daily.  We will add on BuSpar 5 mg 3 times a day as needed.      Relevant Medications   busPIRone (BUSPAR) 5 MG tablet   citalopram (CELEXA) 40 MG tablet   Other Relevant Orders   TSH   Shortness of breath - Primary    Patient continues to complain of shortness of breath.  Patient states it kind of changed on Tuesday.  Patient states she has exertional dyspnea.  Patient was supposed to follow-up with pulmonology 6 to 8 weeks after her last appointment which was in March.  Recommend patient reach out to the pulmonology and schedule follow-up appointment.  Patient also has not seen cardiology since 2020.  Recommend she call their office and schedule appointment also placed referral since its been so long.  Attained EKG which was sinus rhythm.  Also got walking oxygen saturation which dipped down to 92%.  Lung sounds were clear.      Relevant Orders   CBC with Differential/Platelet   COMPLETE METABOLIC PANEL WITH GFR   EKG 12-Lead   Ambulatory referral to Cardiology   TSH   Palpitations    Reports palpitations.  EKG with sinus rhythm.  We will be getting lab work.  Patient is already on  propanolol 20 mg 3 times daily for migraines.  Recommend following up with cardiology.      Relevant Orders   EKG 12-Lead   Ambulatory referral to Cardiology   TSH   Mild episode of recurrent major depressive disorder ([place])    She reports increased anxiety and depression.  Patient is currently taking Celexa 40 mg daily.  We will add on BuSpar 5 mg 3 times a day as needed.      Relevant Medications   busPIRone (BUSPAR) 5 MG tablet   citalopram (CELEXA) 40 MG tablet     Follow up plan: Return for has an appointment already scheduled.

## 2022-04-07 NOTE — Assessment & Plan Note (Signed)
Blood pressure 132/80 today.  Continue taking losartan 25 mg daily.  Is also on atenolol 20 mg 3 times a day for migraines.

## 2022-04-07 NOTE — Assessment & Plan Note (Signed)
Patient continues to complain of shortness of breath.  Patient states it kind of changed on Tuesday.  Patient states she has exertional dyspnea.  Patient was supposed to follow-up with pulmonology 6 to 8 weeks after her last appointment which was in March.  Recommend patient reach out to the pulmonology and schedule follow-up appointment.  Patient also has not seen cardiology since 2020.  Recommend she call their office and schedule appointment also placed referral since its been so long.  Attained EKG which was sinus rhythm.  Also got walking oxygen saturation which dipped down to 92%.  Lung sounds were clear.

## 2022-04-07 NOTE — Assessment & Plan Note (Signed)
She reports increased anxiety and depression.  Patient is currently taking Celexa 40 mg daily.  We will add on BuSpar 5 mg 3 times a day as needed. 

## 2022-04-07 NOTE — Assessment & Plan Note (Signed)
Reports palpitations.  EKG with sinus rhythm.  We will be getting lab work.  Patient is already on propanolol 20 mg 3 times daily for migraines.  Recommend following up with cardiology.

## 2022-04-07 NOTE — Assessment & Plan Note (Signed)
She is currently taking Trulicity 0.75 mg weekly.  She says her blood sugars been running 1 20-1 30.  We will get labs today.

## 2022-04-08 ENCOUNTER — Telehealth: Payer: Self-pay

## 2022-04-08 LAB — CBC WITH DIFFERENTIAL/PLATELET
Absolute Monocytes: 350 cells/uL (ref 200–950)
Basophils Absolute: 32 cells/uL (ref 0–200)
Basophils Relative: 0.7 %
Eosinophils Absolute: 239 cells/uL (ref 15–500)
Eosinophils Relative: 5.2 %
HCT: 36.3 % (ref 35.0–45.0)
Hemoglobin: 12 g/dL (ref 11.7–15.5)
Lymphs Abs: 1155 cells/uL (ref 850–3900)
MCH: 26.6 pg — ABNORMAL LOW (ref 27.0–33.0)
MCHC: 33.1 g/dL (ref 32.0–36.0)
MCV: 80.5 fL (ref 80.0–100.0)
MPV: 10.6 fL (ref 7.5–12.5)
Monocytes Relative: 7.6 %
Neutro Abs: 2824 cells/uL (ref 1500–7800)
Neutrophils Relative %: 61.4 %
Platelets: 422 10*3/uL — ABNORMAL HIGH (ref 140–400)
RBC: 4.51 10*6/uL (ref 3.80–5.10)
RDW: 14.7 % (ref 11.0–15.0)
Total Lymphocyte: 25.1 %
WBC: 4.6 10*3/uL (ref 3.8–10.8)

## 2022-04-08 LAB — LIPID PANEL
Cholesterol: 168 mg/dL (ref <200)
HDL: 57 mg/dL (ref >=50)
LDL Cholesterol (Calc): 93 mg/dL (calc)
Non-HDL Cholesterol (Calc): 111 mg/dL (calc) (ref <130)
Total CHOL/HDL Ratio: 2.9 (calc) (ref <5.0)
Triglycerides: 85 mg/dL (ref <150)

## 2022-04-08 LAB — COMPLETE METABOLIC PANEL WITH GFR
AG Ratio: 1.6 (calc) (ref 1.0–2.5)
ALT: 16 U/L (ref 6–29)
AST: 17 U/L (ref 10–35)
Albumin: 4.2 g/dL (ref 3.6–5.1)
Alkaline phosphatase (APISO): 87 U/L (ref 37–153)
BUN: 20 mg/dL (ref 7–25)
CO2: 24 mmol/L (ref 20–32)
Calcium: 9.6 mg/dL (ref 8.6–10.4)
Chloride: 108 mmol/L (ref 98–110)
Creat: 0.99 mg/dL (ref 0.60–1.00)
Globulin: 2.6 g/dL (calc) (ref 1.9–3.7)
Glucose, Bld: 98 mg/dL (ref 65–99)
Potassium: 4.3 mmol/L (ref 3.5–5.3)
Sodium: 141 mmol/L (ref 135–146)
Total Bilirubin: 0.4 mg/dL (ref 0.2–1.2)
Total Protein: 6.8 g/dL (ref 6.1–8.1)
eGFR: 59 mL/min/{1.73_m2} — ABNORMAL LOW (ref >=60)

## 2022-04-08 LAB — HEMOGLOBIN A1C
Hgb A1c MFr Bld: 7.2 % of total Hgb — ABNORMAL HIGH (ref <5.7)
Mean Plasma Glucose: 160 mg/dL
eAG (mmol/L): 8.9 mmol/L

## 2022-04-08 LAB — TSH: TSH: 1.16 mIU/L (ref 0.40–4.50)

## 2022-04-08 NOTE — Progress Notes (Signed)
Per Clinical pharmacist, For Emgality it's through [facility] so I sent in a Rx yesterday to see if they would accept it or if she would need to do a full PAP. Please check in with [facility] next week to see if it was able to be processed and we can start PAP if needed.  Per [employer], patient would need to submit a new patient assistance application for [employer]Notified clinical pharmacist.   Patient is aware and agreed to come to her PCP office on 04/13/2022 to complete her part, and have the Clinical pharmacist fax it over to [facility] for processing.   [name] Clinical Pharmacist Assistant [phone]

## 2022-04-21 ENCOUNTER — Telehealth: Payer: Self-pay

## 2022-04-21 DIAGNOSIS — M5459 Other low back pain: Secondary | ICD-10-CM | POA: Diagnosis not present

## 2022-04-21 DIAGNOSIS — Z79891 Long term (current) use of opiate analgesic: Secondary | ICD-10-CM | POA: Diagnosis not present

## 2022-04-21 DIAGNOSIS — G894 Chronic pain syndrome: Secondary | ICD-10-CM | POA: Diagnosis not present

## 2022-04-21 NOTE — Progress Notes (Signed)
    Chronic Care Management Pharmacy Assistant   Name: Lori Potts  MRN: [id number] DOB: 08-10-1946  Reason for Encounter: Medication Review/Patient assistance update on Emgality .     I reach out to [employer] to get the status of patient application for [employer]Per [employer], patient needs to complete the new application that is online instead of the old version as it is invalid.    Spoke with the patient and she  requested that the application be place at the front desk for her to pick up.I informed  her once she receives the application she will need to complete her part of the application and return it to her PCP office for [name], CPP to fax over to the [facility] for processing.   Application emailed to [name], CPP for review and to be place at the front desk for patient to pick up.  [name] Clinical Pharmacist Assistant [phone]

## 2022-04-22 ENCOUNTER — Ambulatory Visit: Payer: Medicare HMO | Admitting: Family Medicine

## 2022-04-22 ENCOUNTER — Encounter: Payer: Self-pay | Admitting: Family Medicine

## 2022-04-22 VITALS — BP 142/78 | HR 79 | Resp 16 | Ht 64.0 in | Wt 185.0 lb

## 2022-04-22 DIAGNOSIS — W57XXXA Bitten or stung by nonvenomous insect and other nonvenomous arthropods, initial encounter: Secondary | ICD-10-CM | POA: Diagnosis not present

## 2022-04-22 DIAGNOSIS — L03116 Cellulitis of left lower limb: Secondary | ICD-10-CM | POA: Diagnosis not present

## 2022-04-22 DIAGNOSIS — L089 Local infection of the skin and subcutaneous tissue, unspecified: Secondary | ICD-10-CM

## 2022-04-22 MED ORDER — DOXYCYCLINE HYCLATE 100 MG PO TABS: 100.0000 mg | ORAL_TABLET | Freq: Two times a day (BID) | ORAL | 0 refills | Status: DC

## 2022-04-22 MED ORDER — LORATADINE 10 MG PO TABS: 10.0000 mg | ORAL_TABLET | Freq: Two times a day (BID) | ORAL | 0 refills | Status: DC

## 2022-04-22 NOTE — Progress Notes (Signed)
Name: Lori Potts   MRN: [id number]    DOB: 10-Apr-1946   Date:04/22/2022       Progress Note  Subjective  Chief Complaint  Leg Swollen  HPI  She was at [facility] sitting on a grass area and was bit by insects - she thinks ants - this past weekend, last night she noticed pain and swelling of the left ankle, tender to touch on an area that has some pustular formation. No fever or chills. Able to bear weight. She takes meloxicam but has not been taking any medications for allergies.    Patient Active Problem List   Diagnosis Date Noted   Anxiety 04/07/2022   Shortness of breath 04/07/2022   Palpitations 04/07/2022   Mild episode of recurrent major depressive disorder ([place]) 04/07/2022   Moderate persistent asthma without complication 40/98/1191   Neutropenia, unspecified type ([place]) 11/25/2020   Intractable migraine with aura without status migrainosus 04/18/2020   Body mass index (BMI) 31.0-31.9, adult 02/06/2020   Chronic left hip pain 12/26/2019   Anemia 09/22/2017   S/P knee replacement 07/28/2017   Allergic rhinitis 05/26/2017   Vitamin D deficiency 05/19/2017   Chronic nonintractable headache 05/18/2017   Advanced care planning/counseling discussion    Gastroesophageal reflux disease    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[place]) 03/02/2017   Insomnia 11/29/2016   Chronic right-sided low back pain with right-sided sciatica 04/19/2016   Abdominal wall pain in right flank 03/29/2016   Diverticulosis 02/24/2016   Type 2 diabetes mellitus with microalbuminuria, without long-term current use of insulin ([place]) 06/16/2015   Hyperlipidemia associated with type 2 diabetes mellitus ([place]) 06/16/2015   Chronic pain 04/13/2015   Neck pain 02/26/2015   Status post lumbar surgery 02/26/2015   Abnormal ECG 01/27/2015   Angina pectoris ([place]) 01/27/2015   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[place]) 01/27/2015   Essential (primary) hypertension 01/27/2015    Past  Surgical History:  Procedure Laterality Date   ABDOMINAL HYSTERECTOMY     ANTERIOR CERVICAL DECOMP/DISCECTOMY FUSION N/A 02/26/2015   Procedure: ANTERIOR CERVICAL DISCECTOMY FUSION C4-5 (1 LEVEL);  Surgeon: Melina Schools, MD;  Location: [place];  Service: Orthopedics;  Laterality: N/A;   ANTERIOR LAT LUMBAR FUSION N/A 03/21/2013   Procedure: ANTERIOR LATERAL LUMBAR FUSION 1 LEVEL/ XLIF L3-L4 ;  Surgeon: Melina Schools, MD;  Location: [place];  Service: Orthopedics;  Laterality: N/A;   APPENDECTOMY     AUGMENTATION MAMMAPLASTY Bilateral 1978   BACK SURGERY     BACK SURGERY     Lumbar fusion x 2   BREAST EXCISIONAL BIOPSY Right 1970   BREAST SURGERY  1990   Augementation   CARDIAC CATHETERIZATION  5/12   ef 55%   CARDIAC CATHETERIZATION  10/2010   [facility]; EF 55%   CARDIAC CATHETERIZATION Left 02/16/2016   Procedure: Left Heart Cath and Coronary Angiography;  Surgeon: Yolonda Kida, MD;  Location: [place] [facility];  Service: Cardiovascular;  Laterality: Left;   COLONOSCOPY     COLONOSCOPY WITH PROPOFOL N/A 04/10/2017   Procedure: COLONOSCOPY WITH PROPOFOL;  Surgeon: Lucilla Lame, MD;  Location: [place];  Service: Gastroenterology;  Laterality: N/A;  Diabetic - insulin   ESOPHAGOGASTRODUODENOSCOPY     JOINT REPLACEMENT     KIDNEY SURGERY  1998   growth removed from left kidney    LEFT HEART CATH AND CORONARY ANGIOGRAPHY Left 09/10/2018   Procedure: LEFT HEART CATH AND CORONARY ANGIOGRAPHY;  Surgeon: Yolonda Kida, MD;  Location:  [place] [facility];  Service: Cardiovascular;  Laterality: Left;   pain stimulator     POSTERIOR CERVICAL FUSION/FORAMINOTOMY Right 03/21/2013   Procedure: POSTERIOR L2-3 RIGHT FORAMINOTOMY;  Surgeon: Melina Schools, MD;  Location: [place];  Service: Orthopedics;  Laterality: Right;   REVISION OF SCAR TISSUE RECTUS MUSCLE     SHOULDER ARTHROSCOPY WITH ROTATOR CUFF REPAIR AND SUBACROMIAL DECOMPRESSION Right 09/17/2020   Procedure: RIGHT SHOULDER  ARTHROSCOPY WITH MINI-OPEN ROTATOR CUFF REPAIR, DISTAL CLAVICLE EXCISION, AND SUBACROMIAL DECOMPRESSION WITH BICEP TENDONESIS;  Surgeon: Thornton Park, MD;  Location: [facility];  Service: Orthopedics;  Laterality: Right;   SMALL BOWEL REPAIR     SPINAL CORD STIMULATOR BATTERY EXCHANGE N/A 10/17/2012   Procedure: SPINAL CORD STIMULATOR BATTERY REMOVAL;  Surgeon: Melina Schools, MD;  Location: [place];  Service: Orthopedics;  Laterality: N/A;   SPINAL CORD STIMULATOR BATTERY EXCHANGE N/A 07/22/2015   Procedure: REIMPLANTATION OF SPINAL CORD STIMULATOR BATTERY ;  Surgeon: Melina Schools, MD;  Location: [place];  Service: Orthopedics;  Laterality: N/A;   TOTAL KNEE ARTHROPLASTY Left    TOTAL KNEE ARTHROPLASTY Right 07/28/2017   Procedure: RIGHT TOTAL KNEE ARTHROPLASTY;  Surgeon: Sydnee Cabal, MD;  Location: [facility];  Service: Orthopedics;  Laterality: Right;    Family History  Problem Relation Age of Onset   Heart attack Mother    Sarcoidosis Sister     Social History   Tobacco Use   Smoking status: Former    Types: Cigarettes    Quit date: 2006    Years since quitting: 17.6   Smokeless tobacco: Never   Tobacco comments:    quit smoking 44yrs ago  Substance Use Topics   Alcohol use: No     Current Outpatient Medications:    albuterol (VENTOLIN HFA) 108 (90 Base) MCG/ACT inhaler, Inhale 2 puffs into the lungs every 6 (six) hours as needed for wheezing or shortness of breath., Disp: 18 g, Rfl: 2   ascorbic acid (VITAMIN C) 500 MG tablet, Take 500 mg by mouth daily., Disp: , Rfl:    aspirin 81 MG EC tablet, Take 1 tablet by mouth daily., Disp: , Rfl:    atorvastatin (LIPITOR) 80 MG tablet, Take 1 tablet by mouth daily., Disp: , Rfl:    azelastine (ASTELIN) 0.1 % nasal spray, PLACE 1 SPRAY INTO BOTH NOSTRILS 2 (TWO) TIMES DAILY. USE IN EACH NOSTRIL AS DIRECTED, Disp: 90 mL, Rfl: 1   budesonide-formoterol (SYMBICORT) 160-4.5 MCG/ACT inhaler, Inhale 2 puffs into the lungs 2 (two) times daily.  Patient receives via [employer] Patient Assistance., Disp: , Rfl:    busPIRone (BUSPAR) 5 MG tablet, Take 1 tablet (5 mg total) by mouth 3 (three) times daily as needed., Disp: 90 tablet, Rfl: 0   citalopram (CELEXA) 40 MG tablet, Take 1 tablet (40 mg total) by mouth daily., Disp: 90 tablet, Rfl: 1   cyclobenzaprine (FLEXERIL) 10 MG tablet, , Disp: , Rfl:    doxycycline (VIBRA-TABS) 100 MG tablet, Take 1 tablet (100 mg total) by mouth 2 (two) times daily., Disp: 14 tablet, Rfl: 0   Dulaglutide (TRULICITY) 6.04 VW/0.9WJ SOPN, Inject 0.75 mg into the skin once a week. Patient receives via [employer] Patient Assistance through Dec 2023, Disp: , Rfl:    fluticasone (FLONASE) 50 MCG/ACT nasal spray, PLACE 2 SPRAYS INTO BOTH NOSTRILS EVERY EVENING., Disp: 48 mL, Rfl: 1   loratadine (CLARITIN) 10 MG tablet, Take 1 tablet (10 mg total) by mouth 2 (two) times daily., Disp: 30 tablet, Rfl:  0   losartan (COZAAR) 25 MG tablet, Take 1 tablet (25 mg total) by mouth daily., Disp: 90 tablet, Rfl: 3   meloxicam (MOBIC) 7.5 MG tablet, Take 1 tablet by mouth 2 (two) times daily., Disp: , Rfl:    methocarbamol (ROBAXIN) 500 MG tablet, Take 500 mg by mouth every 8 (eight) hours as needed for muscle spasms., Disp: , Rfl:    montelukast (SINGULAIR) 10 MG tablet, TAKE 1 TABLET BY MOUTH EVERYDAY AT BEDTIME, Disp: 90 tablet, Rfl: 3   Multiple Vitamins-Minerals (MULTIVITAMIN WITH MINERALS) tablet, Take 1 tablet by mouth daily., Disp: , Rfl:    Oxycodone HCl 10 MG TABS, , Disp: , Rfl:    promethazine (PHENERGAN) 12.5 MG tablet, Take 1 tablet (12.5 mg total) by mouth every 8 (eight) hours as needed for nausea or vomiting., Disp: 30 tablet, Rfl: 0   promethazine (PHENERGAN) 25 MG tablet, TAKE 1 TABLET (25 MG TOTAL) BY MOUTH EVERY 8 (EIGHT) HOURS AS NEEDED FOR NAUSEA OR VOMITING., Disp: , Rfl:    propranolol (INDERAL) 20 MG tablet, Take 1 tablet (20 mg total) by mouth 3 (three) times daily., Disp: 90 tablet, Rfl: 3   rosuvastatin  (CRESTOR) 40 MG tablet, TAKE 1 TABLET BY MOUTH EVERYDAY AT BEDTIME, Disp: 90 tablet, Rfl: 1   SUMAtriptan (IMITREX) 25 MG tablet, Take 1 tablet (25 mg total) by mouth every 2 (two) hours as needed for migraine (ongoing headache). Maximum daily dose $RemoveBe'200mg'QcoCvfqXb$ , Disp: 30 tablet, Rfl: 0  Allergies  Allergen Reactions   Metformin And Related Diarrhea   Adhesive [Tape] Rash and Other (See Comments)    Regular tape is ok, allergy is to paper tape    I personally reviewed active problem list, medication list, allergies, family history, social history, health maintenance with the patient/caregiver today.   ROS  Ten systems reviewed and is negative except as mentioned in HPI   Objective  Vitals:   04/22/22 0901  BP: (!) 142/78  Pulse: 79  Resp: 16  SpO2: 96%  Weight: 185 lb (83.9 kg)  Height: $Remove'5\' 4"'VxTTAIb$  (1.626 m)    Body mass index is 31.76 kg/m.  Physical Exam  Constitutional: Patient appears well-developed and well-nourished. Obese  No distress.  HEENT: head atraumatic, normocephalic, pupils equal and reactive to light, neck supple, throat within normal limits Cardiovascular: Normal rate, regular rhythm and normal heart sounds.  No murmur heard.  Skin: left ankle was swollen, with multiple areas of recent insect bites, small area of pustular formation ( 4 lesions ) and some redness ascending to left lower leg.  Pulmonary/Chest: Effort normal and breath sounds normal. No respiratory distress. Abdominal: Soft.  There is no tenderness. Psychiatric: Patient has a normal mood and affect. behavior is normal. Judgment and thought content normal.   Recent Results (from the past 2160 hour(s))  CBC with Differential/Platelet     Status: Abnormal   Collection Time: 04/07/22  1:56 PM  Result Value Ref Range   WBC 4.6 3.8 - 10.8 Thousand/uL   RBC 4.51 3.80 - 5.10 Million/uL   Hemoglobin 12.0 11.7 - 15.5 g/dL   HCT 36.3 35.0 - 45.0 %   MCV 80.5 80.0 - 100.0 fL   MCH 26.6 (L) 27.0 - 33.0 pg    MCHC 33.1 32.0 - 36.0 g/dL   RDW 14.7 11.0 - 15.0 %   Platelets 422 (H) 140 - 400 Thousand/uL   MPV 10.6 7.5 - 12.5 fL   Neutro Abs 2,824 1,500 - 7,800 cells/uL  Lymphs Abs 1,155 850 - 3,900 cells/uL   Absolute Monocytes 350 200 - 950 cells/uL   Eosinophils Absolute 239 15 - 500 cells/uL   Basophils Absolute 32 0 - 200 cells/uL   Neutrophils Relative % 61.4 %   Total Lymphocyte 25.1 %   Monocytes Relative 7.6 %   Eosinophils Relative 5.2 %   Basophils Relative 0.7 %  COMPLETE METABOLIC PANEL WITH GFR     Status: Abnormal   Collection Time: 04/07/22  1:56 PM  Result Value Ref Range   Glucose, Bld 98 65 - 99 mg/dL    Comment: .            Fasting reference interval .    BUN 20 7 - 25 mg/dL   Creat 0.99 0.60 - 1.00 mg/dL   eGFR 59 (L) > OR = 60 mL/min/1.25m2   BUN/Creatinine Ratio SEE NOTE: 6 - 22 (calc)    Comment:    Not Reported: BUN and Creatinine are within    reference range. .    Sodium 141 135 - 146 mmol/L   Potassium 4.3 3.5 - 5.3 mmol/L   Chloride 108 98 - 110 mmol/L   CO2 24 20 - 32 mmol/L   Calcium 9.6 8.6 - 10.4 mg/dL   Total Protein 6.8 6.1 - 8.1 g/dL   Albumin 4.2 3.6 - 5.1 g/dL   Globulin 2.6 1.9 - 3.7 g/dL (calc)   AG Ratio 1.6 1.0 - 2.5 (calc)   Total Bilirubin 0.4 0.2 - 1.2 mg/dL   Alkaline phosphatase (APISO) 87 37 - 153 U/L   AST 17 10 - 35 U/L   ALT 16 6 - 29 U/L  Lipid panel     Status: None   Collection Time: 04/07/22  1:56 PM  Result Value Ref Range   Cholesterol 168 <200 mg/dL   HDL 57 > OR = 50 mg/dL   Triglycerides 85 <150 mg/dL   LDL Cholesterol (Calc) 93 mg/dL (calc)    Comment: Reference range: <100 . Desirable range <100 mg/dL for primary prevention;   <70 mg/dL for patients with CHD or diabetic patients  with > or = 2 CHD risk factors. Marland Kitchen LDL-C is now calculated using the Martin-Hopkins  calculation, which is a validated novel method providing  better accuracy than the Friedewald equation in the  estimation of LDL-C.  Cresenciano Genre  et al. Annamaria Helling. 9794;801(65): 2061-2068  (http://education.[employer].com/faq/FAQ164)    Total CHOL/HDL Ratio 2.9 <5.0 (calc)   Non-HDL Cholesterol (Calc) 111 <130 mg/dL (calc)    Comment: For patients with diabetes plus 1 major ASCVD risk  factor, treating to a non-HDL-C goal of <100 mg/dL  (LDL-C of <70 mg/dL) is considered a therapeutic  option.   Hemoglobin A1c     Status: Abnormal   Collection Time: 04/07/22  1:56 PM  Result Value Ref Range   Hgb A1c MFr Bld 7.2 (H) <5.7 % of total Hgb    Comment: For someone without known diabetes, a hemoglobin A1c value of 6.5% or greater indicates that they may have  diabetes and this should be confirmed with a follow-up  test. . For someone with known diabetes, a value <7% indicates  that their diabetes is well controlled and a value  greater than or equal to 7% indicates suboptimal  control. A1c targets should be individualized based on  duration of diabetes, age, comorbid conditions, and  other considerations. . Currently, no consensus exists regarding use of hemoglobin A1c for diagnosis of diabetes for  children. .    Mean Plasma Glucose 160 mg/dL   eAG (mmol/L) 8.9 mmol/L  TSH     Status: None   Collection Time: 04/07/22  1:56 PM  Result Value Ref Range   TSH 1.16 0.40 - 4.50 mIU/L     PHQ2/9:    04/22/2022    9:06 AM 04/07/2022   12:53 PM 03/17/2022   11:07 AM 01/28/2022   11:47 AM 12/17/2021    9:26 AM  Depression screen PHQ 2/9  Decreased Interest 0 0 0 0 0  Down, Depressed, Hopeless 0 0 0 0 0  PHQ - 2 Score 0 0 0 0 0  Altered sleeping 0 2     Tired, decreased energy 0 2     Change in appetite 0 2     Feeling bad or failure about yourself  0 0     Trouble concentrating 0 0     Moving slowly or fidgety/restless 0 0     Suicidal thoughts 0 0     PHQ-9 Score 0 6       phq 9 is negative   Fall Risk:    04/22/2022    9:01 AM 04/07/2022   12:52 PM 03/17/2022   11:07 AM 01/28/2022   11:46 AM 12/17/2021    9:25 AM   Fall Risk   Falls in the past year? $RemoveBe'1 1 1 1 1  'ioBtEnNJT$ Number falls in past yr: $Remove'1 1 1 1 1  'dzbAeKA$ Injury with Fall? $RemoveBe'1 1 1 1 1  'QcycsdKya$ Risk for fall due to : History of fall(s);Impaired mobility History of fall(s);Impaired mobility History of fall(s) History of fall(s) History of fall(s);Impaired balance/gait  Follow up Falls prevention discussed Falls evaluation completed Falls evaluation completed Falls evaluation completed Falls evaluation completed      Functional Status Survey: Is the patient deaf or have difficulty hearing?: No Does the patient have difficulty seeing, even when wearing glasses/contacts?: No Does the patient have difficulty concentrating, remembering, or making decisions?: No Does the patient have difficulty walking or climbing stairs?: Yes Does the patient have difficulty dressing or bathing?: No Does the patient have difficulty doing errands alone such as visiting a doctor's office or shopping?: No    Assessment & Plan  1. Pustular lesion  - Anaerobic and Aerobic Culture - doxycycline (VIBRA-TABS) 100 MG tablet; Take 1 tablet (100 mg total) by mouth 2 (two) times daily.  Dispense: 14 tablet; Refill: 0  2. Cellulitis of left lower extremity  - Anaerobic and Aerobic Culture - doxycycline (VIBRA-TABS) 100 MG tablet; Take 1 tablet (100 mg total) by mouth 2 (two) times daily.  Dispense: 14 tablet; Refill: 0  3. Reaction to insect bite  - loratadine (CLARITIN) 10 MG tablet; Take 1 tablet (10 mg total) by mouth 2 (two) times daily.  Dispense: 30 tablet; Refill: 0

## 2022-04-26 LAB — ANAEROBIC AND AEROBIC CULTURE
AER RESULT:: NO GROWTH
MICRO NUMBER:: 13868906
SPECIMEN QUALITY:: ADEQUATE

## 2022-04-29 ENCOUNTER — Other Ambulatory Visit: Payer: Self-pay | Admitting: Family Medicine

## 2022-04-29 DIAGNOSIS — W57XXXA Bitten or stung by nonvenomous insect and other nonvenomous arthropods, initial encounter: Secondary | ICD-10-CM

## 2022-04-30 ENCOUNTER — Other Ambulatory Visit: Payer: Self-pay | Admitting: Nurse Practitioner

## 2022-04-30 DIAGNOSIS — F419 Anxiety disorder, unspecified: Secondary | ICD-10-CM

## 2022-05-03 NOTE — Telephone Encounter (Signed)
Requested medication (s) are due for refill today: yes  Requested medication (s) are on the active medication list: yes  Last refill:  04/07/22 #90 with 0 RF (1 month supply)  Future visit scheduled: 05/23/22, seen 04/07/22  Notes to clinic:  Did you mean rx to only be a 30 day supply with a return in 2 month OV note? Pharm requests 90 day prescription and diagnosis code.      Requested Prescriptions  Pending Prescriptions Disp Refills   busPIRone (BUSPAR) 5 MG tablet [Pharmacy Med Name: BUSPIRONE HCL 5 MG TABLET] 270 tablet 1    Sig: TAKE 1 TABLET BY MOUTH 3 TIMES DAILY AS NEEDED.     Psychiatry: Anxiolytics/Hypnotics - Non-controlled Passed - 04/30/2022  1:31 PM      Passed - Valid encounter within last 12 months    Recent Outpatient Visits           1 week ago Pustular lesion   [facility] Alba Cory, MD   3 weeks ago Shortness of breath   [facility] [name], [name]   1 month ago Intractable migraine without aura and without status migrainosus   [facility] [name], [name]   3 months ago Acute recurrent frontal sinusitis   [facility] [name], [name]   4 months ago Acute recurrent frontal sinusitis   [facility] [name], [name]       Future Appointments             In 2 weeks Zane Herald, Rudolpho Sevin, [name] [facility], [facility]

## 2022-05-03 NOTE — Telephone Encounter (Signed)
Ins wants 90 day  

## 2022-05-08 ENCOUNTER — Other Ambulatory Visit: Payer: Self-pay | Admitting: Nurse Practitioner

## 2022-05-08 DIAGNOSIS — I10 Essential (primary) hypertension: Secondary | ICD-10-CM

## 2022-05-08 DIAGNOSIS — G43119 Migraine with aura, intractable, without status migrainosus: Secondary | ICD-10-CM

## 2022-05-09 NOTE — Telephone Encounter (Signed)
Requested Prescriptions  Pending Prescriptions Disp Refills  . propranolol (INDERAL) 20 MG tablet [Pharmacy Med Name: PROPRANOLOL 20 MG TABLET] 270 tablet 0    Sig: TAKE 1 TABLET BY MOUTH THREE TIMES A DAY     Cardiovascular:  Beta Blockers Failed - 05/08/2022  3:35 PM      Failed - Last BP in normal range    BP Readings from Last 1 Encounters:  04/22/22 (!) 142/78         Passed - Last Heart Rate in normal range    Pulse Readings from Last 1 Encounters:  04/22/22 79         Passed - Valid encounter within last 6 months    Recent Outpatient Visits          2 weeks ago Pustular lesion   [facility] Steele Sizer, MD   1 month ago Shortness of breath   [place], [name]   1 month ago Intractable migraine without aura and without status migrainosus   [facility] Serafina Royals F, [name]   3 months ago Acute recurrent frontal sinusitis   [place], [name]   4 months ago Acute recurrent frontal sinusitis   [facility] Bo Merino, [name]      Future Appointments            In 2 weeks Reece Packer, Myna Hidalgo, [facility], [facility]

## 2022-05-13 ENCOUNTER — Telehealth: Payer: Self-pay | Admitting: Emergency Medicine

## 2022-05-13 NOTE — Telephone Encounter (Signed)
Patient called and stated she has cough and sinus infection. Can you do a virtual appointment for her today. I informed patient she may have to go to [facility]

## 2022-05-13 NOTE — Telephone Encounter (Signed)
Patient notified

## 2022-05-17 DIAGNOSIS — R002 Palpitations: Secondary | ICD-10-CM | POA: Diagnosis not present

## 2022-05-17 DIAGNOSIS — R0602 Shortness of breath: Secondary | ICD-10-CM | POA: Diagnosis not present

## 2022-05-17 DIAGNOSIS — Z01818 Encounter for other preprocedural examination: Secondary | ICD-10-CM | POA: Diagnosis not present

## 2022-05-17 DIAGNOSIS — E119 Type 2 diabetes mellitus without complications: Secondary | ICD-10-CM | POA: Diagnosis not present

## 2022-05-17 DIAGNOSIS — E7800 Pure hypercholesterolemia, unspecified: Secondary | ICD-10-CM | POA: Diagnosis not present

## 2022-05-17 DIAGNOSIS — R9431 Abnormal electrocardiogram [ECG] [EKG]: Secondary | ICD-10-CM | POA: Diagnosis not present

## 2022-05-17 DIAGNOSIS — I10 Essential (primary) hypertension: Secondary | ICD-10-CM | POA: Diagnosis not present

## 2022-05-17 DIAGNOSIS — I209 Angina pectoris, unspecified: Secondary | ICD-10-CM | POA: Diagnosis not present

## 2022-05-18 ENCOUNTER — Other Ambulatory Visit: Payer: Self-pay | Admitting: Family Medicine

## 2022-05-18 DIAGNOSIS — W57XXXA Bitten or stung by nonvenomous insect and other nonvenomous arthropods, initial encounter: Secondary | ICD-10-CM

## 2022-05-23 ENCOUNTER — Encounter: Payer: Self-pay | Admitting: Nurse Practitioner

## 2022-05-23 ENCOUNTER — Ambulatory Visit (INDEPENDENT_AMBULATORY_CARE_PROVIDER_SITE_OTHER): Payer: Medicare HMO | Admitting: Nurse Practitioner

## 2022-05-23 VITALS — BP 124/82 | HR 78 | Temp 97.7°F | Resp 18 | Wt 187.7 lb

## 2022-05-23 DIAGNOSIS — E1129 Type 2 diabetes mellitus with other diabetic kidney complication: Secondary | ICD-10-CM

## 2022-05-23 DIAGNOSIS — Z01818 Encounter for other preprocedural examination: Secondary | ICD-10-CM | POA: Diagnosis not present

## 2022-05-23 DIAGNOSIS — G43119 Migraine with aura, intractable, without status migrainosus: Secondary | ICD-10-CM

## 2022-05-23 DIAGNOSIS — E785 Hyperlipidemia, unspecified: Secondary | ICD-10-CM | POA: Diagnosis not present

## 2022-05-23 DIAGNOSIS — I10 Essential (primary) hypertension: Secondary | ICD-10-CM | POA: Diagnosis not present

## 2022-05-23 DIAGNOSIS — J4520 Mild intermittent asthma, uncomplicated: Secondary | ICD-10-CM

## 2022-05-23 DIAGNOSIS — F419 Anxiety disorder, unspecified: Secondary | ICD-10-CM

## 2022-05-23 DIAGNOSIS — E1169 Type 2 diabetes mellitus with other specified complication: Secondary | ICD-10-CM | POA: Diagnosis not present

## 2022-05-23 DIAGNOSIS — R809 Proteinuria, unspecified: Secondary | ICD-10-CM | POA: Diagnosis not present

## 2022-05-23 NOTE — Assessment & Plan Note (Signed)
Patient reports her breathing has been well controlled.  Patient currently uses Symbicort 2 times a day and singular 10 mg daily.  She is also taking Claritin and Flonase.  Continue with current treatment plan.

## 2022-05-23 NOTE — Assessment & Plan Note (Signed)
Continue taking Celexa 40 mg daily.  Patient reports that she is doing well.

## 2022-05-23 NOTE — Progress Notes (Addendum)
BP 124/82   Pulse 78   Temp 97.7 F (36.5 C) (Oral)   Resp 18   Wt 187 lb 11.2 oz (85.1 kg)   SpO2 97%   BMI 32.22 kg/m    Subjective:    Patient ID: Lori Potts, female    DOB: 23-Nov-1945, 76 y.o.   MRN: [id number]  HPI: Lori Potts is a 76 y.o. female, with husband  Chief Complaint  Patient presents with   surgical clearance    Left hip surgery. Dr.Alusio at [place] October 18   Surgical clearance:  Patient here for surgical clearance for left hip replacement.  Dr. Maureen Ralphs at [facility] is going to do her surgery on October 18.  Her Lyndel Safe score is 0.2 %.  She is medically optimized for surgery. She has also been cleared by cardiology. She is going back to cardiology on 05/31/2022 for echo and stress test.   Diabetes: She says her blood sugars have been running around 129.  Her last A1c was 7.2 on 04/07/2022.  She denies any polyphasia, polyuria or polydipsia.  She also denies any hypoglycemic episodes.  She is currently taking Trulicity 1.44 mg weekly.  HTN; her blood pressure today is 124/82.  She is currently taking losartan 25 mg daily, also taking propanolol 20 mg 3 times a day.  She denies any chest pain, shortness of breath, dizziness or blurred vision.  Hyperlipidemia: Currently takes rosuvastatin 40 mg daily.  She denies any myalgia.  Her last LDL was 93 on 04/07/2022. The 10-year ASCVD risk score (Arnett DK, et al., 2019) is: 29.2%   Values used to calculate the score:     Age: 89 years     Sex: Female     Is Non-Hispanic African American: Yes     Diabetic: Yes     Tobacco smoker: No     Systolic Blood Pressure: 818 mmHg     Is BP treated: Yes     HDL Cholesterol: 57 mg/dL     Total Cholesterol: 168 mg/dL   Asthma: She currently uses Symbicort 2 times a day, Singulair 10 mg daily, Claritin and Flonase.  She reports her breathing has been good.   Migraines: Patient states that she has had right-sided migraines for many years.  We  have tried multiple preventatives and abortive therapies.  Patient is currently taking propranolol 20 mg 3 times daily and Imitrex 25 mg when she has a migraine.  Anxiety: Patient reports she is currently taking Celexa 40 mg daily.  Patient reports she is doing well.    05/23/2022   11:34 AM 04/07/2022   12:54 PM 09/13/2021   11:11 AM 08/24/2021   11:44 AM  GAD 7 : Generalized Anxiety Score  Nervous, Anxious, on Edge 0 3 0 0  Control/stop worrying 0 3 0 0  Worry too much - different things 0 3 0 0  Trouble relaxing 0 3 0 0  Restless 0 3 0 0  Easily annoyed or irritable 0 3 0 0  Afraid - awful might happen 0 3 0 0  Total GAD 7 Score 0 21 0 0  Anxiety Difficulty Not difficult at all Very difficult Not difficult at all Not difficult at all     Relevant past medical, surgical, family and social history reviewed and updated as indicated. Interim medical history since our last visit reviewed. Allergies and medications reviewed and updated.  Review of Systems  Constitutional: Negative for fever or weight change.  Respiratory: Negative for cough and shortness of breath.   Cardiovascular: Negative for chest pain or palpitations.  Gastrointestinal: Negative for abdominal pain, no bowel changes.  Musculoskeletal: Negative for gait problem or joint swelling.  Skin: Negative for rash.  Neurological: Negative for dizziness, positive for headache.  No other specific complaints in a complete review of systems (except as listed in HPI above).      Objective:    BP 124/82   Pulse 78   Temp 97.7 F (36.5 C) (Oral)   Resp 18   Wt 187 lb 11.2 oz (85.1 kg)   SpO2 97%   BMI 32.22 kg/m   Wt Readings from Last 3 Encounters:  05/23/22 187 lb 11.2 oz (85.1 kg)  04/22/22 185 lb (83.9 kg)  04/07/22 186 lb 8 oz (84.6 kg)    Physical Exam  Constitutional: Patient appears well-developed and well-nourished. No distress.  HEENT: head atraumatic, normocephalic, pupils equal and reactive to light, neck  supple Cardiovascular: Normal rate, regular rhythm and normal heart sounds.  No murmur heard. No BLE edema. Pulmonary/Chest: Effort normal and breath sounds normal. No respiratory distress. Abdominal: Soft.  There is no tenderness. Psychiatric: Patient has a normal mood and affect. behavior is normal. Judgment and thought content normal.  Diabetic Foot Exam - Simple   Simple Foot Form Diabetic Foot exam was performed with the following findings: Yes 05/23/2022 11:30 AM  Visual Inspection No deformities, no ulcerations, no other skin breakdown bilaterally: Yes Sensation Testing Intact to touch and monofilament testing bilaterally: Yes Pulse Check Posterior Tibialis and Dorsalis pulse intact bilaterally: Yes Comments     Results for orders placed or performed in visit on 04/22/22  Anaerobic and Aerobic Culture  Result Value Ref Range   MICRO NUMBER: CANCELED    SPECIMEN QUALITY: CANCELED    Source: CANCELED    STATUS: CANCELED    GRAM STAIN: CANCELED    ANA RESULT: CANCELED    MICRO NUMBER: [id number]    SPECIMEN QUALITY: Adequate    SOURCE: ANKLE, LEFT    STATUS: FINAL    AER RESULT: No Growth       Assessment & Plan:   Problem List Items Addressed This Visit       Cardiovascular and Mediastinum   Essential (primary) hypertension    Blood pressure at goal.  Continue taking losartan 25 mg daily and propanolol 20 mg 3 times daily.      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        Respiratory   Mild intermittent asthma without complication    Patient reports her breathing has been well controlled.  Patient currently uses Symbicort 2 times a day and singular 10 mg daily.  She is also taking Claritin and Flonase.  Continue with current treatment plan.        Endocrine   Type 2 diabetes mellitus with microalbuminuria, without long-term current  use of insulin (HCC)    Continue taking Trulicity [id number] mg weekly.      Hyperlipidemia associated with type 2 diabetes mellitus ([place])    Continue taking rosuvastatin 40 mg daily.        Other   Anxiety    Continue taking Celexa 40 mg daily.  Patient reports that she is doing well.      Other Visit Diagnoses     Preoperative examination    -  Primary   pt is medically optomized for her left total hip surgery        Follow up plan: Return in about 6 months (around 11/22/2022) for follow up.

## 2022-05-23 NOTE — H&P (Signed)
TOTAL HIP ADMISSION H&P  Patient is admitted for left total hip arthroplasty.  Subjective:  Chief Complaint: Left hip pain  HPI: Lori Potts, 76 y.o. female, has a history of pain and functional disability in the left hip due to arthritis and patient has failed non-surgical conservative treatments for greater than 12 weeks to include corticosteriod injections, use of assistive devices, and activity modification. Onset of symptoms was gradual, starting  several  years ago with gradually worsening course since that time. The patient noted no past surgery on the left hip. Patient currently rates pain in the left hip at 8 out of 10 with activity. Patient has night pain, worsening of pain with activity and weight bearing, pain that interfers with activities of daily living, and pain with passive range of motion. Patient has evidence of  significant impingement morphology with focal bone-on-bone centrally and inferiorly and with marginal osteophytes superior anterosuperiorly  by imaging studies. This condition presents safety issues increasing the risk of falls. There is no current active infection.  Patient Active Problem List   Diagnosis Date Noted   Mild intermittent asthma without complication 0000000   Anxiety 04/07/2022   Shortness of breath 04/07/2022   Palpitations 04/07/2022   Mild episode of recurrent major depressive disorder ([place]) 04/07/2022   Moderate persistent asthma without complication [id number]   Neutropenia, unspecified type ([place]) 11/25/2020   Intractable migraine with aura without status migrainosus 04/18/2020   Body mass index (BMI) 31.0-31.9, adult 02/06/2020   Chronic left hip pain 12/26/2019   Anemia 09/22/2017   S/P knee replacement 07/28/2017   Allergic rhinitis 05/26/2017   Vitamin D deficiency 05/19/2017   Chronic nonintractable headache 05/18/2017   Advanced care planning/counseling discussion    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[place]) 03/02/2017   Insomnia  11/29/2016   Chronic right-sided low back pain with right-sided sciatica 04/19/2016   Abdominal wall pain in right flank 03/29/2016   Diverticulosis 02/24/2016   Type 2 diabetes mellitus with microalbuminuria, without long-term current use of insulin ([place]) 06/16/2015   Hyperlipidemia associated with type 2 diabetes mellitus ([place]) 06/16/2015   Chronic pain 04/13/2015   Neck pain 02/26/2015   Status post lumbar surgery 02/26/2015   Abnormal ECG 01/27/2015   Angina pectoris ([place]) 01/27/2015   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[place]) 01/27/2015   Essential (primary) hypertension 01/27/2015    Past Medical History:  Diagnosis Date   Anxiety    Arthritis    Asthma    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[place]) 03/02/2017   Cataract    right eye but immature   Essential hypertension, benign    takes Lisinopril-HCTZ daily   GERD (gastroesophageal reflux disease)    Headache    sinus   History of colon polyps    benign   History of migraine    History of shingles    Hyperlipidemia    takes Lipitor daily   Low back pain    Nocturia    S/P insertion of spinal cord stimulator    Seasonal allergies    takes Singulair daily as needed   Type II or unspecified type diabetes mellitus without mention of complication, not stated as uncontrolled    Vitamin D deficiency    takes Vit D weekly   Weakness    numbness and tingling left arm   Wears dentures    full upper and lower    Past Surgical History:  Procedure Laterality Date   ABDOMINAL HYSTERECTOMY     ANTERIOR  CERVICAL DECOMP/DISCECTOMY FUSION N/A 02/26/2015   Procedure: ANTERIOR CERVICAL DISCECTOMY FUSION C4-5 (1 LEVEL);  Surgeon: Melina Schools, MD;  Location: [facility];  Service: Orthopedics;  Laterality: N/A;   ANTERIOR LAT LUMBAR FUSION N/A 03/21/2013   Procedure: ANTERIOR LATERAL LUMBAR FUSION 1 LEVEL/ XLIF L3-L4 ;  Surgeon: Melina Schools, MD;  Location: [facility];  Service: Orthopedics;  Laterality: N/A;    APPENDECTOMY     AUGMENTATION MAMMAPLASTY Bilateral 1978   BACK SURGERY     BACK SURGERY     Lumbar fusion x 2   BREAST EXCISIONAL BIOPSY Right 1970   BREAST SURGERY  1990   Augementation   CARDIAC CATHETERIZATION  5/12   ef 55%   CARDIAC CATHETERIZATION  10/2010   [facility]; EF 55%   CARDIAC CATHETERIZATION Left 02/16/2016   Procedure: Left Heart Cath and Coronary Angiography;  Surgeon: Yolonda Kida, MD;  Location: [facility];  Service: Cardiovascular;  Laterality: Left;   COLONOSCOPY     COLONOSCOPY WITH PROPOFOL N/A 04/10/2017   Procedure: COLONOSCOPY WITH PROPOFOL;  Surgeon: Lucilla Lame, MD;  Location: [facility];  Service: Gastroenterology;  Laterality: N/A;  Diabetic - insulin   ESOPHAGOGASTRODUODENOSCOPY     JOINT REPLACEMENT     KIDNEY SURGERY  1998   growth removed from left kidney    LEFT HEART CATH AND CORONARY ANGIOGRAPHY Left 09/10/2018   Procedure: LEFT HEART CATH AND CORONARY ANGIOGRAPHY;  Surgeon: Yolonda Kida, MD;  Location: [facility];  Service: Cardiovascular;  Laterality: Left;   pain stimulator     POSTERIOR CERVICAL FUSION/FORAMINOTOMY Right 03/21/2013   Procedure: POSTERIOR L2-3 RIGHT FORAMINOTOMY;  Surgeon: Melina Schools, MD;  Location: [facility];  Service: Orthopedics;  Laterality: Right;   REVISION OF SCAR TISSUE RECTUS MUSCLE     SHOULDER ARTHROSCOPY WITH ROTATOR CUFF REPAIR AND SUBACROMIAL DECOMPRESSION Right 09/17/2020   Procedure: RIGHT SHOULDER ARTHROSCOPY WITH MINI-OPEN ROTATOR CUFF REPAIR, DISTAL CLAVICLE EXCISION, AND SUBACROMIAL DECOMPRESSION WITH BICEP TENDONESIS;  Surgeon: Thornton Park, MD;  Location: [facility];  Service: Orthopedics;  Laterality: Right;   SMALL BOWEL REPAIR     SPINAL CORD STIMULATOR BATTERY EXCHANGE N/A 10/17/2012   Procedure: SPINAL CORD STIMULATOR BATTERY REMOVAL;  Surgeon: Melina Schools, MD;  Location: [facility];  Service: Orthopedics;  Laterality: N/A;   SPINAL CORD STIMULATOR BATTERY EXCHANGE N/A  07/22/2015   Procedure: REIMPLANTATION OF SPINAL CORD STIMULATOR BATTERY ;  Surgeon: Melina Schools, MD;  Location: [facility];  Service: Orthopedics;  Laterality: N/A;   TOTAL KNEE ARTHROPLASTY Left    TOTAL KNEE ARTHROPLASTY Right 07/28/2017   Procedure: RIGHT TOTAL KNEE ARTHROPLASTY;  Surgeon: Sydnee Cabal, MD;  Location: [facility];  Service: Orthopedics;  Laterality: Right;    Prior to Admission medications   Medication Sig Start Date End Date Taking? Authorizing Provider  albuterol (VENTOLIN HFA) 108 (90 Base) MCG/ACT inhaler Inhale 2 puffs into the lungs every 6 (six) hours as needed for wheezing or shortness of breath. 02/16/22   Bo Merino, [name]  ascorbic acid (VITAMIN C) 500 MG tablet Take 500 mg by mouth daily.    [provider]  aspirin 81 MG EC tablet Take 1 tablet by mouth daily.    [provider]  atorvastatin (LIPITOR) 80 MG tablet Take 1 tablet by mouth daily.    [provider]  azelastine (ASTELIN) 0.1 % nasal spray PLACE 1 SPRAY INTO BOTH NOSTRILS 2 (TWO) TIMES DAILY. USE IN EACH NOSTRIL AS DIRECTED 12/28/20  Steele Sizer, MD  budesonide-formoterol [facility]) 160-4.5 MCG/ACT inhaler Inhale 2 puffs into the lungs 2 (two) times daily. Patient receives via [employer] Patient Assistance. 02/16/22   Bo Merino, [name]  busPIRone (BUSPAR) 5 MG tablet TAKE 1 TABLET BY MOUTH 3 TIMES DAILY AS NEEDED. 05/03/22   Bo Merino, [name]  citalopram (CELEXA) 40 MG tablet Take 1 tablet (40 mg total) by mouth daily. 04/07/22   Bo Merino, [name]  cyclobenzaprine (FLEXERIL) 10 MG tablet     [provider]  doxycycline (VIBRA-TABS) 100 MG tablet Take 1 tablet (100 mg total) by mouth 2 (two) times daily. 04/22/22   Steele Sizer, MD  Dulaglutide (TRULICITY) A999333 0000000 SOPN Inject 0.75 mg into the skin once a week. Patient receives via [employer] Patient Assistance through Dec 2023 11/24/21   Bo Merino, [name]  fluticasone [facility]) 50 MCG/ACT nasal spray  PLACE 2 SPRAYS INTO BOTH NOSTRILS EVERY EVENING. 12/28/20   Steele Sizer, MD  loratadine (CLARITIN) 10 MG tablet TAKE 1 TABLET BY MOUTH TWICE A DAY 05/18/22   Bo Merino, [name]  losartan (COZAAR) 25 MG tablet Take 1 tablet (25 mg total) by mouth daily. 12/17/21   Bo Merino, [name]  meloxicam (MOBIC) 7.5 MG tablet Take 1 tablet by mouth 2 (two) times daily. 01/19/21   [provider]  methocarbamol (ROBAXIN) 500 MG tablet Take 500 mg by mouth every 8 (eight) hours as needed for muscle spasms.    [provider]  montelukast (SINGULAIR) 10 MG tablet TAKE 1 TABLET BY MOUTH EVERYDAY AT BEDTIME 09/09/21   Bo Merino, [name]  Multiple Vitamins-Minerals (MULTIVITAMIN WITH MINERALS) tablet Take 1 tablet by mouth daily.    [provider]  Oxycodone HCl 10 MG TABS  06/02/21   [provider]  promethazine (PHENERGAN) 12.5 MG tablet Take 1 tablet (12.5 mg total) by mouth every 8 (eight) hours as needed for nausea or vomiting. 03/17/22   Bo Merino, [name]  promethazine (PHENERGAN) 25 MG tablet TAKE 1 TABLET (25 MG TOTAL) BY MOUTH EVERY 8 (EIGHT) HOURS AS NEEDED FOR NAUSEA OR VOMITING.    [provider]  propranolol (INDERAL) 20 MG tablet TAKE 1 TABLET BY MOUTH THREE TIMES A DAY 05/09/22   Bo Merino, [name]  rosuvastatin (CRESTOR) 40 MG tablet TAKE 1 TABLET BY MOUTH EVERYDAY AT BEDTIME 12/28/21   Bo Merino, [name]  SUMAtriptan (IMITREX) 25 MG tablet Take 1 tablet (25 mg total) by mouth every 2 (two) hours as needed for migraine (ongoing headache). Maximum daily dose 200mg  03/17/22   Bo Merino, [name]    Allergies  Allergen Reactions   Metformin And Related Diarrhea   Adhesive [Tape] Rash and Other (See Comments)    Regular tape is ok, allergy is to paper tape    Social History   Socioeconomic History   Marital status: Married    Spouse name: Gwyndolyn Saxon   Number of children: 3   Years of education: Not on file   Highest education level: Associate  degree: occupational, Hotel manager, or vocational program  Occupational History   Not on file  Tobacco Use   Smoking status: Former    Types: Cigarettes    Quit date: 2006    Years since quitting: 17.7   Smokeless tobacco: Never   Tobacco comments:    quit smoking 50yrs ago  Vaping Use   Vaping Use: Never used  Substance and Sexual Activity   Alcohol use: No  Drug use: No   Sexual activity: Yes    Partners: Male    Birth control/protection: Surgical  Other Topics Concern   Not on file  Social History Narrative   Not on file   Social Determinants of Health   Financial Resource Strain: Medium Risk (11/24/2021)   Overall Financial Resource Strain (CARDIA)    Difficulty of Paying Living Expenses: Somewhat hard  Food Insecurity: No Food Insecurity (11/18/2021)   Hunger Vital Sign    Worried About Running Out of Food in the Last Year: Never true    Ran Out of Food in the Last Year: Never true  Transportation Needs: No Transportation Needs (11/18/2021)   PRAPARE - Hydrologist (Medical): No    Lack of Transportation (Non-Medical): No  Physical Activity: Inactive (11/18/2021)   Exercise Vital Sign    Days of Exercise per Week: 0 days    Minutes of Exercise per Session: 0 min  Stress: No Stress Concern Present (11/18/2021)   [place]    Feeling of Stress : Not at all  Social Connections: [place] (11/18/2021)   Social Connection and Isolation Panel [NHANES]    Frequency of Communication with Friends and Family: More than three times a week    Frequency of Social Gatherings with Friends and Family: Twice a week    Attends Religious Services: More than 4 times per year    Active Member of [employer] or Organizations: Yes    Attends Archivist Meetings: 1 to 4 times per year    Marital Status: Married  Human resources officer Violence: Not At Risk (11/18/2021)   Humiliation, Afraid,  Rape, and Kick questionnaire    Fear of Current or Ex-Partner: No    Emotionally Abused: No    Physically Abused: No    Sexually Abused: No    Tobacco Use: Medium Risk (05/23/2022)   Patient History    Smoking Tobacco Use: Former    Smokeless Tobacco Use: Never    Passive Exposure: Not on file   Social History   Substance and Sexual Activity  Alcohol Use No    Family History  Problem Relation Age of Onset   Heart attack Mother    Sarcoidosis Sister     Review of Systems  Constitutional:  Negative for chills and fever.  HENT:  Negative for congestion, sore throat and tinnitus.   Eyes:  Negative for double vision, photophobia and pain.  Respiratory:  Negative for cough, shortness of breath and wheezing.   Cardiovascular:  Negative for chest pain, palpitations and orthopnea.  Gastrointestinal:  Negative for heartburn, nausea and vomiting.  Genitourinary:  Negative for dysuria, frequency and urgency.  Musculoskeletal:  Positive for joint pain.  Neurological:  Negative for dizziness, weakness and headaches.    Objective:  Physical Exam: Well nourished and well developed.  General: Alert and oriented x3, cooperative and pleasant, no acute distress.  Head: normocephalic, atraumatic, neck supple.  Eyes: EOMI.  Musculoskeletal:  Left Hip Exam: The range of motion: Flexion to 100 degrees, Internal Rotation to 0 degrees, External Rotation to 10 to 20 degrees, and abduction to 20 degrees with pain on range of motion. There is no tenderness over the greater trochanteric bursa.  Calves soft and nontender. Motor function intact in LE. Strength 5/5 LE bilaterally. Neuro: Distal pulses 2+. Sensation to light touch intact in LE.  Vital signs in last 24 hours: Temp:  [97.7 F (  36.5 C)] 97.7 F (36.5 C) (10/02 1129) Pulse Rate:  [78] 78 (10/02 1129) Resp:  [18] 18 (10/02 1129) BP: (124)/(82) 124/82 (10/02 1129) SpO2:  [97 %] 97 % (10/02 1129) Weight:  [85.1 kg] 85.1 kg (10/02  1129)  Imaging Review Plain radiographs demonstrate severe degenerative joint disease of the left hip. The bone quality appears to be adequate for age and reported activity level.  Assessment/Plan:  End stage arthritis, left hip  The patient history, physical examination, clinical judgement of the provider and imaging studies are consistent with end stage degenerative joint disease of the left hip and total hip arthroplasty is deemed medically necessary. The treatment options including medical management, injection therapy, arthroscopy and arthroplasty were discussed at length. The risks and benefits of total hip arthroplasty were presented and reviewed. The risks due to aseptic loosening, infection, stiffness, dislocation/subluxation, thromboembolic complications and other imponderables were discussed. The patient acknowledged the explanation, agreed to proceed with the plan and consent was signed. Patient is being admitted for inpatient treatment for surgery, pain control, PT, OT, prophylactic antibiotics, VTE prophylaxis, progressive ambulation and ADLs and discharge planning.The patient is planning to be discharged  home .   Patient's anticipated LOS is less than 2 midnights, meeting these requirements: - Lives within 1 hour of care - Has a competent adult at home to recover with post-op recover - NO history of  - Diabetes  - Coronary Artery Disease  - Heart failure  - Heart attack  - Stroke  - DVT/VTE  - Cardiac arrhythmia  - Respiratory Failure/COPD  - Renal failure  - Anemia  - Advanced Liver disease  Therapy Plans: HEP Disposition: Home with husband Planned DVT Prophylaxis: Aspirin 325 mg BID DME Needed: None PCP: Serafina Royals, [name] (clearance received) Cardiologist: Lujean Amel, MD (clearance received) TXA: IV Allergies: Adhesive tape Anesthesia Concerns: None BMI: 32.1 Last HgbA1c: 7.2% (04/07/22) [facility]  Other: - Oxycodone 10 mg 5x  daily. Discussed dilaudid postoperatively.   - Patient was instructed on what medications to stop prior to surgery. - Follow-up visit in 2 weeks with Dr. Wynelle Link - Begin physical therapy following surgery - Pre-operative lab work as pre-surgical testing - Prescriptions will be provided in hospital at time of discharge  Theresa Duty, PA-C Orthopedic Surgery [facility] [place]

## 2022-05-23 NOTE — Assessment & Plan Note (Signed)
Blood pressure at goal.  Continue taking losartan 25 mg daily and propanolol 20 mg 3 times daily.

## 2022-05-23 NOTE — Addendum Note (Signed)
Addended by: Serafina Royals F on: 05/23/2022 03:13 PM   Modules accepted: Orders

## 2022-05-23 NOTE — Assessment & Plan Note (Signed)
Continue taking Trulicity 6.01 mg weekly.

## 2022-05-23 NOTE — Assessment & Plan Note (Signed)
Continue taking rosuvastatin 40 mg daily.   

## 2022-05-23 NOTE — Assessment & Plan Note (Signed)
Continue taking propanolol 20 mg 3 times daily as preventative and Imitrex 25 mg when you have a migraine.

## 2022-05-24 ENCOUNTER — Telehealth: Payer: Self-pay

## 2022-05-24 NOTE — Progress Notes (Signed)
Chronic Care Management Pharmacy Assistant   Name: Lori Potts  MRN: [id number] DOB: 12-19-1945  Reason for Encounter: Medication Review/Patient assistance renewal for Trulicity and Emgality.   Recent office visits:  05/23/2022 [name] [name] (PCP) No medication Changes noted,Return in about 6 months  04/22/2022 Dr. Carlynn Purl MD (PCP) Start Doxycycline100 MG tablet 2 times daily, Start loratadine (CLARITIN) 10 MG tablet 2 times daily 04/07/2022 [name] [name] (PCP) Start Buspar 5 mg 3 times a day as needed, Ambulatory referral to Cardiology  Recent consult visits:  05/17/2022 Dr. Juliann Pares MD (Cardiology) No medication Changes noted  Hospital visits:  None in previous 6 months  Medications: Outpatient Encounter Medications as of 05/24/2022  Medication Sig   albuterol (VENTOLIN HFA) 108 (90 Base) MCG/ACT inhaler Inhale 2 puffs into the lungs every 6 (six) hours as needed for wheezing or shortness of breath.   ascorbic acid (VITAMIN C) 500 MG tablet Take 500 mg by mouth daily.   aspirin 81 MG EC tablet Take 1 tablet by mouth daily.   atorvastatin (LIPITOR) 80 MG tablet Take 1 tablet by mouth daily.   azelastine (ASTELIN) 0.1 % nasal spray PLACE 1 SPRAY INTO BOTH NOSTRILS 2 (TWO) TIMES DAILY. USE IN EACH NOSTRIL AS DIRECTED   budesonide-formoterol (SYMBICORT) 160-4.5 MCG/ACT inhaler Inhale 2 puffs into the lungs 2 (two) times daily. Patient receives via [employer] Patient Assistance.   busPIRone (BUSPAR) 5 MG tablet TAKE 1 TABLET BY MOUTH 3 TIMES DAILY AS NEEDED.   citalopram (CELEXA) 40 MG tablet Take 1 tablet (40 mg total) by mouth daily.   cyclobenzaprine (FLEXERIL) 10 MG tablet    doxycycline (VIBRA-TABS) 100 MG tablet Take 1 tablet (100 mg total) by mouth 2 (two) times daily.   Dulaglutide (TRULICITY) 0.75 MG/0.5ML SOPN Inject 0.75 mg into the skin once a week. Patient receives via [employer] Patient Assistance through Dec 2023   fluticasone (FLONASE) 50 MCG/ACT nasal spray  PLACE 2 SPRAYS INTO BOTH NOSTRILS EVERY EVENING.   loratadine (CLARITIN) 10 MG tablet TAKE 1 TABLET BY MOUTH TWICE A DAY   losartan (COZAAR) 25 MG tablet Take 1 tablet (25 mg total) by mouth daily.   meloxicam (MOBIC) 7.5 MG tablet Take 1 tablet by mouth 2 (two) times daily.   methocarbamol (ROBAXIN) 500 MG tablet Take 500 mg by mouth every 8 (eight) hours as needed for muscle spasms.   montelukast (SINGULAIR) 10 MG tablet TAKE 1 TABLET BY MOUTH EVERYDAY AT BEDTIME   Multiple Vitamins-Minerals (MULTIVITAMIN WITH MINERALS) tablet Take 1 tablet by mouth daily.   Oxycodone HCl 10 MG TABS    promethazine (PHENERGAN) 12.5 MG tablet Take 1 tablet (12.5 mg total) by mouth every 8 (eight) hours as needed for nausea or vomiting.   promethazine (PHENERGAN) 25 MG tablet TAKE 1 TABLET (25 MG TOTAL) BY MOUTH EVERY 8 (EIGHT) HOURS AS NEEDED FOR NAUSEA OR VOMITING.   propranolol (INDERAL) 20 MG tablet TAKE 1 TABLET BY MOUTH THREE TIMES A DAY   rosuvastatin (CRESTOR) 40 MG tablet TAKE 1 TABLET BY MOUTH EVERYDAY AT BEDTIME   SUMAtriptan (IMITREX) 25 MG tablet Take 1 tablet (25 mg total) by mouth every 2 (two) hours as needed for migraine (ongoing headache). Maximum daily dose 200mg    No facility-administered encounter medications on file as of 05/24/2022.    Care Gaps: Shingrix Vaccine Opthalmology exam  COVID-19 Vaccine     Star Rating Drugs: Rosuvastatin 40 mg last filled 03/06/2022 [facility] day [facility]  Losartan 25 mg last filled 03/14/2022 for [facility] day supply at [facility] Trulicity 7.07 mg last filled 11/30/2020 for 84 day supply at [facility] (receives from [employer])   Medication Fill Gaps: None ID  Patient assistance renewal for Trulicity:  I received a task from Junius Argyle, CPP requesting that I start the renewal application for patient assistance on the medication Trulicity to continue assistance through year 2024.    Spoke with the patient and she  requested that the  application be mailed.I informed  her once he receives the application he will need to complete his part of the application and return it to his PCP office for Junius Argyle, CPP to fax over to the [facility] for processing.Informed patient to include a copy of her proof of income.  Patient verbalized understanding and was provided my phone number of [phone] if he has any questions.   Application emailed to Junius Argyle, CPP for review and to mail to patient home.  [place] Pharmacist Assistant [phone]

## 2022-05-26 NOTE — Patient Instructions (Signed)
DUE TO COVID-19 ONLY TWO VISITORS  (aged 76 and older)  ARE ALLOWED TO COME WITH YOU AND STAY IN THE WAITING ROOM ONLY DURING PRE OP AND PROCEDURE.   **NO VISITORS ARE ALLOWED IN THE SHORT STAY AREA OR RECOVERY ROOM!!**  IF YOU WILL BE ADMITTED INTO THE HOSPITAL YOU ARE ALLOWED ONLY FOUR SUPPORT PEOPLE DURING VISITATION HOURS ONLY (7 AM -8PM)   The support person(s) must pass our screening, gel in and out, and wear a mask at all times, including in the patient's room. Patients must also wear a mask when staff or their support person are in the room. Visitors GUEST BADGE MUST BE WORN VISIBLY  One adult visitor may remain with you overnight and MUST be in the room by 8 P.M.     Your procedure is scheduled on: 06/08/22   Report to [facility] Main Entrance    Report to admitting at  9:10 AM   Call this number if you have problems the morning of surgery [phone]   Do not eat food :After Midnight.   After Midnight you may have the following liquids until _8:30_____ AM/  DAY OF SURGERY  Water Black Coffee (sugar ok, NO MILK/CREAM OR CREAMERS)  Tea (sugar ok, NO MILK/CREAM OR CREAMERS) regular and decaf                             Plain Jell-O (NO RED)                                           Fruit ices (not with fruit pulp, NO RED)                                     Popsicles (NO RED)                                                                  Juice: apple, WHITE grape, WHITE cranberry Sports drinks like Gatorade (NO RED)                  The day of surgery:  Drink ONE  G2 at  8:15 AM the morning of surgery. Drink in one sitting. Do not sip.  This drink was given to you during your hospital  pre-op appointment visit. Nothing else to drink after completing the   G2. At 8:30 AM          If you have questions, please contact your surgeon's office.     Oral Hygiene is also important to reduce your risk of infection.                                    Remember -  BRUSH YOUR TEETH THE MORNING OF SURGERY WITH YOUR REGULAR TOOTHPASTE   Do NOT smoke after Midnight   Take these medicines the morning of surgery with A SIP OF WATER:   Oxycodone if needed  Buspirone- Buspar if needed                                                                                                                              Use your inhalers and bring them with you                                                                                                                              Citalopram- Celexa                                                                                                                              Loratadine-Claritin                                                                                                                             Propranolol- Inderal  DO NOT TAKE ANY ORAL DIABETIC MEDICATIONS DAY OF YOUR SURGERY  Bring CPAP mask and tubing day of surgery.                              You may not have any metal on your body including hair pins, jewelry, and body piercing             Do not wear make-up, lotions, powders,  perfumes/cologne, or deodorant  Do not wear nail polish including gel and S&S, artificial/acrylic nails, or any other type of covering on natural nails including finger and toenails. If you have artificial nails, gel coating, etc. that needs to be removed by a nail salon please have this removed prior to surgery or surgery may need to be canceled/ delayed if the surgeon/ anesthesia feels like they are unable to be safely monitored.   Do not shave  48 hours prior to surgery.     Do not bring valuables to the hospital. [place].   Contacts, dentures or bridgework may not be worn into surgery.   Bring small overnight bag day of surgery.   DO NOT  [place]. PHARMACY WILL DISPENSE MEDICATIONS LISTED ON YOUR MEDICATION LIST TO YOU DURING YOUR ADMISSION [place]!       Special Instructions: Bring a copy of your healthcare power of attorney and living will documents the day of surgery if you haven't scanned them before.              Please read over the following fact sheets you were given: IF YOU HAVE QUESTIONS ABOUT YOUR PRE-OP INSTRUCTIONS PLEASE CALL [phone]     [facility] - Preparing for Surgery Before surgery, you can play an important role.  Because skin is not sterile, your skin needs to be as free of germs as possible.  You can reduce the number of germs on your skin by washing with CHG (chlorahexidine gluconate) soap before surgery.  CHG is an antiseptic cleaner which kills germs and bonds with the skin to continue killing germs even after washing. Please DO NOT use if you have an allergy to CHG or antibacterial soaps.  If your skin becomes reddened/irritated stop using the CHG and inform your nurse when you arrive at Short Stay. Do not shave (including legs and underarms) for at least 48 hours prior to the first CHG shower.   Please follow these instructions carefully:  1.  Shower with CHG Soap the night before surgery and the  morning of Surgery.  2.  If you choose to wash your hair, wash your hair first as usual with your  normal  shampoo.  3.  After you shampoo, rinse your hair and body thoroughly to remove the  shampoo.                            4.  Use CHG as you would any other liquid soap.  You can apply chg directly  to the skin and wash                       Gently with a scrungie or clean washcloth.  5.  Apply the CHG Soap to your body ONLY FROM THE NECK DOWN.   Do not use on face/ open                           Wound or open sores. Avoid contact with eyes, ears mouth and genitals (private parts).                       Wash face,  Genitals (private parts) with your normal  soap.  6.  Wash thoroughly, paying special attention to the area where your surgery  will be performed.  7.  Thoroughly rinse your body with warm water from the neck down.  8.  DO NOT shower/wash with your normal soap after using and rinsing off  the CHG Soap.                9.  Pat yourself dry with a clean towel.            10.  Wear clean pajamas.            11.  Place clean sheets on your bed the night of your first shower and do not  sleep with pets. Day of Surgery : Do not apply any lotions/deodorants the morning of surgery.  Please wear clean clothes to the hospital/surgery center.  FAILURE TO FOLLOW THESE INSTRUCTIONS MAY RESULT IN THE CANCELLATION OF YOUR SURGERY   ________________________________________________________________________   Incentive Spirometer  An incentive spirometer is a tool that can help keep your lungs clear and active. This tool measures how well you are filling your lungs with each breath. Taking long deep breaths may help reverse or decrease the chance of developing breathing (pulmonary) problems (especially infection) following: A long period of time when you are unable to move or be active. BEFORE THE PROCEDURE  If the spirometer includes an indicator to show your best effort, your nurse or respiratory therapist will set it to a desired goal. If possible, sit up straight or lean slightly forward. Try not to slouch. Hold the incentive spirometer in an upright position. INSTRUCTIONS FOR USE  Sit on the edge of your bed if possible, or sit up as far as you can in bed or on a chair. Hold the incentive spirometer in an upright position. Breathe out normally. Place the mouthpiece in your mouth and seal your lips tightly around it. Breathe in slowly and as deeply as possible, raising the piston or the ball toward the top of the column. Hold your breath for 3-5 seconds or for as long as possible. Allow the piston or ball to fall to the bottom of the  column. Remove the mouthpiece from your mouth and breathe out normally. Rest for a few seconds and repeat Steps 1 through 7 at least 10 times every 1-2 hours when you are awake. Take your time and take a few normal breaths between deep breaths. The spirometer may include an indicator to show your best effort. Use the indicator as a goal to work toward during each repetition. After each set of 10 deep breaths, practice coughing to be sure your lungs are clear. If you have an incision (the cut made at the time of surgery), support your incision when coughing by placing a pillow or rolled up towels firmly against it. Once you are able to get out of bed, walk around indoors and cough well. You may stop using the incentive spirometer when instructed by your caregiver.  RISKS AND COMPLICATIONS Take your time so you do not get dizzy or light-headed. If you are in pain, you may need to take or ask for pain medication before doing incentive spirometry. It is harder to take a deep breath if you are having pain. AFTER USE Rest and breathe slowly and easily. It can be helpful to keep track of a log of your progress. Your caregiver can provide you with a simple table to help with this. If you are using the spirometer at home,  follow these instructions: [place] IF:  You are having difficultly using the spirometer. You have trouble using the spirometer as often as instructed. Your pain medication is not giving enough relief while using the spirometer. You develop fever of 100.5 F (38.1 C) or higher. SEEK IMMEDIATE MEDICAL CARE IF:  You cough up bloody sputum that had not been present before. You develop fever of 102 F (38.9 C) or greater. You develop worsening pain at or near the incision site. MAKE SURE YOU:  Understand these instructions. Will watch your condition. Will get help right away if you are not doing well or get worse. Document Released: 12/19/2006 Document Revised: 10/31/2011  Document Reviewed: 02/19/2007 [facility] Patient Information 2014 [place], [place].   ________________________________________________________________________

## 2022-05-30 ENCOUNTER — Encounter (HOSPITAL_COMMUNITY)
Admission: RE | Admit: 2022-05-30 | Discharge: 2022-05-30 | Disposition: A | Discharge status: Home / Self Care | Payer: Medicare HMO | Source: Ambulatory Visit | Attending: Nurse Practitioner | Admitting: Nurse Practitioner

## 2022-05-31 DIAGNOSIS — E1129 Type 2 diabetes mellitus with other diabetic kidney complication: Secondary | ICD-10-CM | POA: Diagnosis not present

## 2022-05-31 DIAGNOSIS — R809 Proteinuria, unspecified: Secondary | ICD-10-CM | POA: Diagnosis not present

## 2022-06-01 LAB — MICROALBUMIN / CREATININE URINE RATIO
Creatinine, Urine: 74 mg/dL (ref 20–275)
Microalb Creat Ratio: 205 mcg/mg creat — ABNORMAL HIGH (ref <30)
Microalb, Ur: 15.2 mg/dL

## 2022-06-02 ENCOUNTER — Other Ambulatory Visit: Payer: Self-pay | Admitting: Nurse Practitioner

## 2022-06-02 DIAGNOSIS — N1831 Chronic kidney disease, stage 3a: Secondary | ICD-10-CM

## 2022-06-02 DIAGNOSIS — R809 Proteinuria, unspecified: Secondary | ICD-10-CM

## 2022-06-02 DIAGNOSIS — E1129 Type 2 diabetes mellitus with other diabetic kidney complication: Secondary | ICD-10-CM

## 2022-06-02 MED ORDER — DAPAGLIFLOZIN PROPANEDIOL 5 MG PO TABS: 5.0000 mg | ORAL_TABLET | Freq: Every day | ORAL | 1 refills | Status: DC

## 2022-06-02 NOTE — Patient Instructions (Addendum)
DUE TO COVID-19 ONLY TWO VISITORS  (aged 76 and older)  ARE ALLOWED TO COME WITH YOU AND STAY IN THE WAITING ROOM ONLY DURING PRE OP AND PROCEDURE.   **NO VISITORS ARE ALLOWED IN THE SHORT STAY AREA OR RECOVERY ROOM!!**  IF YOU WILL BE ADMITTED INTO THE HOSPITAL YOU ARE ALLOWED ONLY FOUR SUPPORT PEOPLE DURING VISITATION HOURS ONLY (7 AM -8PM)   The support person(s) must pass our screening, gel in and out, and wear a mask at all times, including in the patient's room. Patients must also wear a mask when staff or their support person are in the room. Visitors GUEST BADGE MUST BE WORN VISIBLY  One adult visitor may remain with you overnight and MUST be in the room by 8 P.M.     Your procedure is scheduled on: 06/08/22   Report to [facility] Main Entrance    Report to admitting at: 9:10 AM   Call this number if you have problems the morning of surgery [phone]   Do not eat food :After Midnight.   After Midnight you may have the following liquids until 8:30 AM DAY OF SURGERY  Water Black Coffee (sugar ok, NO MILK/CREAM OR CREAMERS)  Tea (sugar ok, NO MILK/CREAM OR CREAMERS) regular and decaf                             Plain Jell-O (NO RED)                                           Fruit ices (not with fruit pulp, NO RED)                                     Popsicles (NO RED)                                                                  Juice: apple, WHITE grape, WHITE cranberry Sports drinks like Gatorade (NO RED)              Drink G2 drink AT: 8:30 AM the day of surgery.      The day of surgery:  Drink ONE (1) Pre-Surgery Clear Ensure or G2 at AM the morning of surgery. Drink in one sitting. Do not sip.  This drink was given to you during your hospital  pre-op appointment visit. Nothing else to drink after completing the  Pre-Surgery Clear Ensure or G2.          If you have questions, please contact your surgeon's office.   Oral Hygiene is also important to  reduce your risk of infection.                                    Remember - BRUSH YOUR TEETH THE MORNING OF SURGERY WITH YOUR REGULAR TOOTHPASTE   Do NOT smoke after Midnight   Take these medicines the morning of surgery with A SIP OF WATER:  citalopram(celexa),loratadine,propranolol(inderal). Use inhalers as usual. Oxicodone,Buspar,sumatriptan as needed. How to Manage Your Diabetes Before and After Surgery  Why is it important to control my blood sugar before and after surgery? Improving blood sugar levels before and after surgery helps healing and can limit problems. A way of improving blood sugar control is eating a healthy diet by:  Eating less sugar and carbohydrates  Increasing activity/exercise  Talking with your doctor about reaching your blood sugar goals High blood sugars (greater than 180 mg/dL) can raise your risk of infections and slow your recovery, so you will need to focus on controlling your diabetes during the weeks before surgery. Make sure that the doctor who takes care of your diabetes knows about your planned surgery including the date and location.  How do I manage my blood sugar before surgery? Check your blood sugar at least 4 times a day, starting 2 days before surgery, to make sure that the level is not too high or low. Check your blood sugar the morning of your surgery when you wake up and every 2 hours until you get to the Short Stay unit. If your blood sugar is less than 70 mg/dL, you will need to treat for low blood sugar: Do not take insulin. Treat a low blood sugar (less than 70 mg/dL) with  cup of clear juice (cranberry or apple), 4 glucose tablets, OR glucose gel. Recheck blood sugar in 15 minutes after treatment (to make sure it is greater than 70 mg/dL). If your blood sugar is not greater than 70 mg/dL on recheck, call [phone] for further instructions. Report your blood sugar to the short stay nurse when you get to Short Stay.  If you are admitted  to the hospital after surgery: Your blood sugar will be checked by the staff and you will probably be given insulin after surgery (instead of oral diabetes medicines) to make sure you have good blood sugar levels. The goal for blood sugar control after surgery is 80-180 mg/dL.   WHAT DO I DO ABOUT MY DIABETES MEDICATION?  DO NOT TAKE THE FOLLOWING 7 DAYS PRIOR TO SURGERY: Ozempic, Wegovy, Rybelsus (Semaglutide), Byetta (exenatide), Bydureon (exenatide ER), Victoza, Saxenda (liraglutide), or Trulicity (dulaglutide) Mounjaro (Tirzepatide) Adlyxin (Lixisenatide), Polyethylene Glycol Loxenatide.   DO NOT TAKE ANY ORAL DIABETIC MEDICATIONS DAY OF YOUR SURGERY                              You may not have any metal on your body including hair pins, jewelry, and body piercing             Do not wear make-up, lotions, powders, perfumes/cologne, or deodorant  Do not wear nail polish including gel and S&S, artificial/acrylic nails, or any other type of covering on natural nails including finger and toenails. If you have artificial nails, gel coating, etc. that needs to be removed by a nail salon please have this removed prior to surgery or surgery may need to be canceled/ delayed if the surgeon/ anesthesia feels like they are unable to be safely monitored.   Do not shave  48 hours prior to surgery.    Do not bring valuables to the hospital. [facility] IS NOT             RESPONSIBLE   FOR VALUABLES.   Contacts, dentures or bridgework may not be worn into surgery.   Bring small overnight bag day of surgery.   DO  NOT BRING YOUR HOME MEDICATIONS TO THE HOSPITAL. PHARMACY WILL DISPENSE MEDICATIONS LISTED ON YOUR MEDICATION LIST TO YOU DURING YOUR ADMISSION IN THE HOSPITAL!    Patients discharged on the day of surgery will not be allowed to drive home.  Someone NEEDS to stay with you for the first 24 hours after anesthesia.   Special Instructions: Bring a copy of your healthcare power of attorney and  living will documents         the day of surgery if you haven't scanned them before.              Please read over the following fact sheets you were given: IF YOU HAVE QUESTIONS ABOUT YOUR PRE-OP INSTRUCTIONS PLEASE CALL [phone]     [facility] Before surgery, you can play an important role.  Because skin is not sterile, your skin needs to be as free of germs as possible.  You can reduce the number of germs on your skin by washing with CHG (chlorahexidine gluconate) soap before surgery.  CHG is an antiseptic cleaner which kills germs and bonds with the skin to continue killing germs even after washing. Please DO NOT use if you have an allergy to CHG or antibacterial soaps.  If your skin becomes reddened/irritated stop using the CHG and inform your nurse when you arrive at Short Stay. Do not shave (including legs and underarms) for at least 48 hours prior to the first CHG shower.  You may shave your face/neck. Please follow these instructions carefully:  1.  Shower with CHG Soap the night before surgery and the  morning of Surgery.  2.  If you choose to wash your hair, wash your hair first as usual with your  normal  shampoo.  3.  After you shampoo, rinse your hair and body thoroughly to remove the  shampoo.                           4.  Use CHG as you would any other liquid soap.  You can apply chg directly  to the skin and wash                       Gently with a scrungie or clean washcloth.  5.  Apply the CHG Soap to your body ONLY FROM THE NECK DOWN.   Do not use on face/ open                           Wound or open sores. Avoid contact with eyes, ears mouth and genitals (private parts).                       Wash face,  Genitals (private parts) with your normal soap.             6.  Wash thoroughly, paying special attention to the area where your surgery  will be performed.  7.  Thoroughly rinse your body with warm water from the neck down.  8.  DO NOT shower/wash  with your normal soap after using and rinsing off  the CHG Soap.                9.  Pat yourself dry with a clean towel.            10.  Wear clean pajamas.  11.  Place clean sheets on your bed the night of your first shower and do not  sleep with pets. Day of Surgery : Do not apply any lotions/deodorants the morning of surgery.  Please wear clean clothes to the hospital/surgery center.  FAILURE TO FOLLOW THESE INSTRUCTIONS MAY RESULT IN THE CANCELLATION OF YOUR SURGERY PATIENT SIGNATURE_________________________________  NURSE SIGNATURE__________________________________  ________________________________________________________________________   Lori Potts  An incentive spirometer is a tool that can help keep your lungs clear and active. This tool measures how well you are filling your lungs with each breath. Taking long deep breaths may help reverse or decrease the chance of developing breathing (pulmonary) problems (especially infection) following: A long period of time when you are unable to move or be active. BEFORE THE PROCEDURE  If the spirometer includes an indicator to show your best effort, your nurse or respiratory therapist will set it to a desired goal. If possible, sit up straight or lean slightly forward. Try not to slouch. Hold the incentive spirometer in an upright position. INSTRUCTIONS FOR USE  Sit on the edge of your bed if possible, or sit up as far as you can in bed or on a chair. Hold the incentive spirometer in an upright position. Breathe out normally. Place the mouthpiece in your mouth and seal your lips tightly around it. Breathe in slowly and as deeply as possible, raising the piston or the ball toward the top of the column. Hold your breath for 3-5 seconds or for as long as possible. Allow the piston or ball to fall to the bottom of the column. Remove the mouthpiece from your mouth and breathe out normally. Rest for a few seconds and repeat  Steps 1 through 7 at least 10 times every 1-2 hours when you are awake. Take your time and take a few normal breaths between deep breaths. The spirometer may include an indicator to show your best effort. Use the indicator as a goal to work toward during each repetition. After each set of 10 deep breaths, practice coughing to be sure your lungs are clear. If you have an incision (the cut made at the time of surgery), support your incision when coughing by placing a pillow or rolled up towels firmly against it. Once you are able to get out of bed, walk around indoors and cough well. You may stop using the incentive spirometer when instructed by your caregiver.  RISKS AND COMPLICATIONS Take your time so you do not get dizzy or light-headed. If you are in pain, you may need to take or ask for pain medication before doing incentive spirometry. It is harder to take a deep breath if you are having pain. AFTER USE Rest and breathe slowly and easily. It can be helpful to keep track of a log of your progress. Your caregiver can provide you with a simple table to help with this. If you are using the spirometer at home, follow these instructions: SEEK MEDICAL CARE IF:  You are having difficultly using the spirometer. You have trouble using the spirometer as often as instructed. Your pain medication is not giving enough relief while using the spirometer. You develop fever of 100.5 F (38.1 C) or higher. SEEK IMMEDIATE MEDICAL CARE IF:  You cough up bloody sputum that had not been present before. You develop fever of 102 F (38.9 C) or greater. You develop worsening pain at or near the incision site. MAKE SURE YOU:  Understand these instructions. Will watch your condition. Will get  help right away if you are not doing well or get worse. Document Released: 12/19/2006 Document Revised: 10/31/2011 Document Reviewed: 02/19/2007 [facility] Patient Information 2014 [place],  [place].   ________________________________________________________________________

## 2022-06-03 ENCOUNTER — Encounter (HOSPITAL_COMMUNITY)
Admission: RE | Admit: 2022-06-03 | Discharge: 2022-06-03 | Disposition: A | Discharge status: Home / Self Care | Payer: Medicare HMO | Source: Ambulatory Visit | Attending: Orthopedic Surgery | Admitting: Orthopedic Surgery

## 2022-06-03 ENCOUNTER — Other Ambulatory Visit: Payer: Self-pay

## 2022-06-03 ENCOUNTER — Encounter (HOSPITAL_COMMUNITY): Payer: Self-pay

## 2022-06-03 DIAGNOSIS — Z01812 Encounter for preprocedural laboratory examination: Secondary | ICD-10-CM | POA: Insufficient documentation

## 2022-06-03 DIAGNOSIS — R809 Proteinuria, unspecified: Secondary | ICD-10-CM | POA: Insufficient documentation

## 2022-06-03 DIAGNOSIS — E1129 Type 2 diabetes mellitus with other diabetic kidney complication: Secondary | ICD-10-CM | POA: Diagnosis not present

## 2022-06-03 DIAGNOSIS — Z01818 Encounter for other preprocedural examination: Secondary | ICD-10-CM

## 2022-06-03 LAB — CBC
HCT: 39 % (ref 36.0–46.0)
Hemoglobin: 12.4 g/dL (ref 12.0–15.0)
MCH: 26.2 pg (ref 26.0–34.0)
MCHC: 31.8 g/dL (ref 30.0–36.0)
MCV: 82.5 fL (ref 80.0–100.0)
Platelets: 426 10*3/uL — ABNORMAL HIGH (ref 150–400)
RBC: 4.73 MIL/uL (ref 3.87–5.11)
RDW: 14.6 % (ref 11.5–15.5)
WBC: 5.3 10*3/uL (ref 4.0–10.5)
nRBC: 0 % (ref 0.0–0.2)

## 2022-06-03 LAB — HEMOGLOBIN A1C
Hgb A1c MFr Bld: 7.1 % — ABNORMAL HIGH (ref 4.8–5.6)
Mean Plasma Glucose: 157.07 mg/dL

## 2022-06-03 LAB — BASIC METABOLIC PANEL
Anion gap: 7 (ref 5–15)
BUN: 19 mg/dL (ref 8–23)
CO2: 26 mmol/L (ref 22–32)
Calcium: 9.6 mg/dL (ref 8.9–10.3)
Chloride: 108 mmol/L (ref 98–111)
Creatinine, Ser: 0.87 mg/dL (ref 0.44–1.00)
GFR, Estimated: >60 mL/min (ref >60)
Glucose, Bld: 115 mg/dL — ABNORMAL HIGH (ref 70–99)
Potassium: 3.9 mmol/L (ref 3.5–5.1)
Sodium: 141 mmol/L (ref 135–145)

## 2022-06-03 LAB — SURGICAL PCR SCREEN
MRSA, PCR: NEGATIVE
Staphylococcus aureus: NEGATIVE

## 2022-06-03 LAB — GLUCOSE, CAPILLARY: Glucose-Capillary: 129 mg/dL — ABNORMAL HIGH (ref 70–99)

## 2022-06-03 NOTE — Progress Notes (Signed)
For Short Stay: [facility] appointment date: Date of COVID positive in last 42 days:  Bowel Prep reminder:   For Anesthesia: PCP - Almyra Free PenderFNP Cardiologist - Dr. Lujean Amel: [facility]: clearance: 05/19/22: Epic/CEW  Chest x-ray - 11/14/21 EKG - 04/07/22 Stress Test - 2019 ECHO - 05/17/22 Cardiac Cath - 2012,2017,2020 Pacemaker/ICD device last checked: Pacemaker orders received: Device Rep notified:  Spinal Cord Stimulator: In (battery is death)  Sleep Study -  CPAP -   Fasting Blood Sugar - 100's Checks Blood Sugar : continuous blood glucose meter. Date and result of last Hgb A1c-  Blood Thinner Instructions: Aspirin Instructions: On hold since: 06/01/22 Last Dose:  Activity level: Can go up a flight of stairs and activities of daily living without stopping and without chest pain and/or shortness of breath   Able to exercise without chest pain and/or shortness of breath   Unable to go up a flight of stairs without chest pain and/or shortness of breath     Anesthesia review: Hx: DIA,HTN.  Patient denies shortness of breath, fever, cough and chest pain at PAT appointment   Patient verbalized understanding of instructions that were given to them at the PAT appointment. Patient was also instructed that they will need to review over the PAT instructions again at home before surgery.

## 2022-06-07 NOTE — Anesthesia Preprocedure Evaluation (Addendum)
Anesthesia Evaluation  Patient identified by MRN, date of birth, ID band Patient awake    Reviewed: Allergy & Precautions, NPO status , Patient's Chart, lab work & pertinent test results  Airway Mallampati: II  TM Distance: >3 FB Neck ROM: Full    Dental  (+) Upper Dentures, Lower Dentures   Pulmonary asthma , former smoker,    Pulmonary exam normal        Cardiovascular hypertension, Pt. on medications and Pt. on home beta blockers  Rhythm:Regular Rate:Normal     Neuro/Psych  Headaches, Anxiety Depression    GI/Hepatic Neg liver ROS, GERD  Medicated,  Endo/Other  diabetes, Type 2  Renal/GU negative Renal ROS  negative genitourinary   Musculoskeletal  (+) Arthritis , Osteoarthritis,    Abdominal Normal abdominal exam  (+)   Peds  Hematology  (+) Blood dyscrasia, anemia ,   Anesthesia Other Findings   Reproductive/Obstetrics                            Anesthesia Physical Anesthesia Plan  ASA: 2  Anesthesia Plan: General   Post-op Pain Management:    Induction: Intravenous  PONV Risk Score and Plan: 2 and Ondansetron, Dexamethasone and Treatment may vary due to age or medical condition  Airway Management Planned: Mask and Oral ETT  Additional Equipment: None  Intra-op Plan:   Post-operative Plan: Extubation in OR  Informed Consent: I have reviewed the patients History and Physical, chart, labs and discussed the procedure including the risks, benefits and alternatives for the proposed anesthesia with the patient or authorized representative who has indicated his/her understanding and acceptance.     Dental advisory given  Plan Discussed with: CRNA  Anesthesia Plan Comments: (-PATIENT REQUESTING GA VS SPINAL. Risks/benefits of both methods discussed in detail and patient in understanding.  Lab Results      Component                Value               Date                       WBC                      5.3                 06/03/2022                HGB                      12.4                06/03/2022                HCT                      39.0                06/03/2022                MCV                      82.5                06/03/2022                PLT  426 (H)             06/03/2022           Lab Results      Component                Value               Date                      NA                       141                 06/03/2022                K                        3.9                 06/03/2022                CO2                      26                  06/03/2022                GLUCOSE                  115 (H)             06/03/2022                BUN                      19                  06/03/2022                CREATININE               0.87                06/03/2022                CALCIUM                  9.6                 06/03/2022                EGFR                     59 (L)              04/07/2022                GFRNONAA                 >60                 06/03/2022          )       Anesthesia Quick Evaluation

## 2022-06-08 ENCOUNTER — Other Ambulatory Visit: Payer: Self-pay

## 2022-06-08 ENCOUNTER — Ambulatory Visit (HOSPITAL_BASED_OUTPATIENT_CLINIC_OR_DEPARTMENT_OTHER): Payer: Medicare HMO | Admitting: Anesthesiology

## 2022-06-08 ENCOUNTER — Observation Stay (HOSPITAL_COMMUNITY)
Admission: RE | Admit: 2022-06-08 | Discharge: 2022-06-09 | Disposition: A | Discharge status: Home / Self Care | Payer: Medicare HMO | Attending: Orthopedic Surgery | Admitting: Orthopedic Surgery

## 2022-06-08 ENCOUNTER — Encounter (HOSPITAL_COMMUNITY): Payer: Self-pay | Admitting: Orthopedic Surgery

## 2022-06-08 ENCOUNTER — Encounter (HOSPITAL_COMMUNITY)
Admission: RE | Disposition: A | Discharge status: Home / Self Care | Payer: Self-pay | Source: Home / Self Care | Attending: Orthopedic Surgery

## 2022-06-08 ENCOUNTER — Ambulatory Visit (HOSPITAL_COMMUNITY): Payer: Medicare HMO

## 2022-06-08 ENCOUNTER — Ambulatory Visit (HOSPITAL_COMMUNITY): Payer: Medicare HMO | Admitting: Anesthesiology

## 2022-06-08 ENCOUNTER — Observation Stay (HOSPITAL_COMMUNITY): Payer: Medicare HMO

## 2022-06-08 DIAGNOSIS — R809 Proteinuria, unspecified: Secondary | ICD-10-CM

## 2022-06-08 DIAGNOSIS — J4520 Mild intermittent asthma, uncomplicated: Secondary | ICD-10-CM | POA: Insufficient documentation

## 2022-06-08 DIAGNOSIS — Z7985 Long-term (current) use of injectable non-insulin antidiabetic drugs: Secondary | ICD-10-CM | POA: Diagnosis not present

## 2022-06-08 DIAGNOSIS — I10 Essential (primary) hypertension: Secondary | ICD-10-CM | POA: Insufficient documentation

## 2022-06-08 DIAGNOSIS — E119 Type 2 diabetes mellitus without complications: Secondary | ICD-10-CM | POA: Diagnosis not present

## 2022-06-08 DIAGNOSIS — Z471 Aftercare following joint replacement surgery: Secondary | ICD-10-CM | POA: Diagnosis not present

## 2022-06-08 DIAGNOSIS — Z79899 Other long term (current) drug therapy: Secondary | ICD-10-CM | POA: Insufficient documentation

## 2022-06-08 DIAGNOSIS — M1612 Unilateral primary osteoarthritis, left hip: Principal | ICD-10-CM | POA: Diagnosis present

## 2022-06-08 DIAGNOSIS — Z96653 Presence of artificial knee joint, bilateral: Secondary | ICD-10-CM | POA: Insufficient documentation

## 2022-06-08 DIAGNOSIS — M169 Osteoarthritis of hip, unspecified: Secondary | ICD-10-CM | POA: Diagnosis present

## 2022-06-08 DIAGNOSIS — Z01818 Encounter for other preprocedural examination: Secondary | ICD-10-CM

## 2022-06-08 DIAGNOSIS — Z87891 Personal history of nicotine dependence: Secondary | ICD-10-CM | POA: Diagnosis not present

## 2022-06-08 DIAGNOSIS — Z96642 Presence of left artificial hip joint: Secondary | ICD-10-CM | POA: Diagnosis not present

## 2022-06-08 DIAGNOSIS — E1129 Type 2 diabetes mellitus with other diabetic kidney complication: Secondary | ICD-10-CM

## 2022-06-08 HISTORY — PX: TOTAL HIP ARTHROPLASTY: SHX124

## 2022-06-08 LAB — TYPE AND SCREEN
ABO/RH(D): O POS
Antibody Screen: NEGATIVE

## 2022-06-08 LAB — GLUCOSE, CAPILLARY
Glucose-Capillary: 164 mg/dL — ABNORMAL HIGH (ref 70–99)
Glucose-Capillary: 140 mg/dL — ABNORMAL HIGH (ref 70–99)
Glucose-Capillary: 311 mg/dL — ABNORMAL HIGH (ref 70–99)
Glucose-Capillary: 190 mg/dL — ABNORMAL HIGH (ref 70–99)

## 2022-06-08 SURGERY — ARTHROPLASTY, HIP, TOTAL, ANTERIOR APPROACH
Anesthesia: General | Site: Hip | Laterality: Left

## 2022-06-08 MED ORDER — LACTATED RINGERS IV SOLN: INTRAVENOUS | Status: DC

## 2022-06-08 MED ORDER — FENTANYL CITRATE (PF) 100 MCG/2ML IJ SOLN
INTRAMUSCULAR | Status: AC
  Filled 2022-06-08: qty 2

## 2022-06-08 MED ORDER — LIDOCAINE 2% (20 MG/ML) 5 ML SYRINGE
INTRAMUSCULAR | Status: DC | PRN
  Administered 2022-06-08: 100 mg via INTRAVENOUS

## 2022-06-08 MED ORDER — ROCURONIUM BROMIDE 10 MG/ML (PF) SYRINGE
PREFILLED_SYRINGE | INTRAVENOUS | Status: DC | PRN
  Administered 2022-06-08: 50 mg via INTRAVENOUS
  Administered 2022-06-08: 20 mg via INTRAVENOUS

## 2022-06-08 MED ORDER — PROPOFOL 500 MG/50ML IV EMUL
INTRAVENOUS | Status: DC | PRN
  Administered 2022-06-08: 50 ug/kg/min via INTRAVENOUS

## 2022-06-08 MED ORDER — ACETAMINOPHEN 10 MG/ML IV SOLN
1000.0000 mg | Freq: Once | INTRAVENOUS | Status: AC
  Administered 2022-06-08: 1000 mg via INTRAVENOUS
  Filled 2022-06-08: qty 100

## 2022-06-08 MED ORDER — CEFAZOLIN SODIUM-DEXTROSE 2-4 GM/100ML-% IV SOLN
2.0000 g | Freq: Four times a day (QID) | INTRAVENOUS | Status: AC
  Administered 2022-06-08 (×2): 2 g via INTRAVENOUS
  Filled 2022-06-08 (×2): qty 100

## 2022-06-08 MED ORDER — PROPOFOL 10 MG/ML IV BOLUS
INTRAVENOUS | Status: AC
  Filled 2022-06-08: qty 20

## 2022-06-08 MED ORDER — PROMETHAZINE HCL 25 MG PO TABS: 12.5000 mg | ORAL_TABLET | Freq: Three times a day (TID) | ORAL | Status: DC | PRN

## 2022-06-08 MED ORDER — PHENOL 1.4 % MT LIQD: 1.0000 | OROMUCOSAL | Status: DC | PRN

## 2022-06-08 MED ORDER — PROPOFOL 1000 MG/100ML IV EMUL
INTRAVENOUS | Status: AC
  Filled 2022-06-08: qty 100

## 2022-06-08 MED ORDER — MORPHINE SULFATE (PF) 2 MG/ML IV SOLN
0.5000 mg | INTRAVENOUS | Status: DC | PRN
  Administered 2022-06-08 – 2022-06-09 (×3): 1 mg via INTRAVENOUS
  Filled 2022-06-08 (×3): qty 1

## 2022-06-08 MED ORDER — WATER FOR IRRIGATION, STERILE IR SOLN
Status: DC | PRN
  Administered 2022-06-08: 2000 mL

## 2022-06-08 MED ORDER — BISACODYL 10 MG RE SUPP: 10.0000 mg | Freq: Every day | RECTAL | Status: DC | PRN

## 2022-06-08 MED ORDER — DAPAGLIFLOZIN PROPANEDIOL 5 MG PO TABS
5.0000 mg | ORAL_TABLET | Freq: Every day | ORAL | Status: DC
  Administered 2022-06-09: 5 mg via ORAL
  Filled 2022-06-08: qty 1

## 2022-06-08 MED ORDER — POLYETHYLENE GLYCOL 3350 17 G PO PACK: 17.0000 g | PACK | Freq: Every day | ORAL | Status: DC | PRN

## 2022-06-08 MED ORDER — ONDANSETRON HCL 4 MG/2ML IJ SOLN
INTRAMUSCULAR | Status: AC
  Filled 2022-06-08: qty 2

## 2022-06-08 MED ORDER — ONDANSETRON HCL 4 MG PO TABS: 4.0000 mg | ORAL_TABLET | Freq: Four times a day (QID) | ORAL | Status: DC | PRN

## 2022-06-08 MED ORDER — BUPIVACAINE-EPINEPHRINE (PF) 0.25% -1:200000 IJ SOLN
INTRAMUSCULAR | Status: DC | PRN
  Administered 2022-06-08: 30 mL

## 2022-06-08 MED ORDER — INSULIN ASPART 100 UNIT/ML IJ SOLN: 0.0000 [IU] | Freq: Every day | INTRAMUSCULAR | Status: DC

## 2022-06-08 MED ORDER — DEXAMETHASONE SODIUM PHOSPHATE 10 MG/ML IJ SOLN
INTRAMUSCULAR | Status: AC
  Filled 2022-06-08: qty 1

## 2022-06-08 MED ORDER — ACETAMINOPHEN 325 MG PO TABS: 325.0000 mg | ORAL_TABLET | Freq: Four times a day (QID) | ORAL | Status: DC | PRN

## 2022-06-08 MED ORDER — ALBUTEROL SULFATE (2.5 MG/3ML) 0.083% IN NEBU: 3.0000 mL | INHALATION_SOLUTION | Freq: Four times a day (QID) | RESPIRATORY_TRACT | Status: DC | PRN

## 2022-06-08 MED ORDER — DOCUSATE SODIUM 100 MG PO CAPS
100.0000 mg | ORAL_CAPSULE | Freq: Two times a day (BID) | ORAL | Status: DC
  Administered 2022-06-08 – 2022-06-09 (×3): 100 mg via ORAL
  Filled 2022-06-08 (×3): qty 1

## 2022-06-08 MED ORDER — ACETAMINOPHEN 10 MG/ML IV SOLN: 1000.0000 mg | Freq: Once | INTRAVENOUS | Status: DC | PRN

## 2022-06-08 MED ORDER — LOSARTAN POTASSIUM 25 MG PO TABS
25.0000 mg | ORAL_TABLET | Freq: Every day | ORAL | Status: DC
  Administered 2022-06-09: 25 mg via ORAL
  Filled 2022-06-08: qty 1

## 2022-06-08 MED ORDER — METHOCARBAMOL 500 MG PO TABS
500.0000 mg | ORAL_TABLET | Freq: Four times a day (QID) | ORAL | Status: DC | PRN
  Administered 2022-06-08 – 2022-06-09 (×3): 500 mg via ORAL
  Filled 2022-06-08 (×3): qty 1

## 2022-06-08 MED ORDER — BUPIVACAINE-EPINEPHRINE (PF) 0.25% -1:200000 IJ SOLN
INTRAMUSCULAR | Status: AC
  Filled 2022-06-08: qty 30

## 2022-06-08 MED ORDER — ORAL CARE MOUTH RINSE: 15.0000 mL | Freq: Once | OROMUCOSAL | Status: AC

## 2022-06-08 MED ORDER — MAGNESIUM CITRATE PO SOLN: 1.0000 | Freq: Once | ORAL | Status: DC | PRN

## 2022-06-08 MED ORDER — OXYCODONE HCL 5 MG PO TABS
5.0000 mg | ORAL_TABLET | ORAL | Status: DC | PRN
  Administered 2022-06-08 – 2022-06-09 (×3): 10 mg via ORAL
  Filled 2022-06-08 (×2): qty 2

## 2022-06-08 MED ORDER — PANTOPRAZOLE SODIUM 40 MG PO TBEC
40.0000 mg | DELAYED_RELEASE_TABLET | Freq: Every day | ORAL | Status: DC
  Administered 2022-06-08 – 2022-06-09 (×2): 40 mg via ORAL
  Filled 2022-06-08 (×2): qty 1

## 2022-06-08 MED ORDER — PROPRANOLOL HCL 20 MG PO TABS
20.0000 mg | ORAL_TABLET | Freq: Two times a day (BID) | ORAL | Status: DC
  Administered 2022-06-08 – 2022-06-09 (×2): 20 mg via ORAL
  Filled 2022-06-08: qty 1

## 2022-06-08 MED ORDER — PROPOFOL 10 MG/ML IV BOLUS
INTRAVENOUS | Status: DC | PRN
  Administered 2022-06-08: 110 mg via INTRAVENOUS

## 2022-06-08 MED ORDER — OXYCODONE HCL 5 MG PO TABS
10.0000 mg | ORAL_TABLET | Freq: Every day | ORAL | Status: DC | PRN
  Administered 2022-06-08 – 2022-06-09 (×2): 10 mg via ORAL
  Filled 2022-06-08 (×3): qty 2

## 2022-06-08 MED ORDER — TRANEXAMIC ACID-NACL 1000-0.7 MG/100ML-% IV SOLN
1000.0000 mg | INTRAVENOUS | Status: AC
Start: 2022-06-08 — End: 2022-06-08
  Administered 2022-06-08: 1000 mg via INTRAVENOUS
  Filled 2022-06-08: qty 100

## 2022-06-08 MED ORDER — CHLORHEXIDINE GLUCONATE 0.12 % MT SOLN
15.0000 mL | Freq: Once | OROMUCOSAL | Status: AC
  Administered 2022-06-08: 15 mL via OROMUCOSAL

## 2022-06-08 MED ORDER — FENTANYL CITRATE (PF) 100 MCG/2ML IJ SOLN
INTRAMUSCULAR | Status: DC | PRN
  Administered 2022-06-08 (×2): 50 ug via INTRAVENOUS

## 2022-06-08 MED ORDER — 0.9 % SODIUM CHLORIDE (POUR BTL) OPTIME
TOPICAL | Status: DC | PRN
  Administered 2022-06-08: 1000 mL

## 2022-06-08 MED ORDER — CEFAZOLIN SODIUM-DEXTROSE 2-4 GM/100ML-% IV SOLN
2.0000 g | INTRAVENOUS | Status: AC
  Administered 2022-06-08: 2 mg via INTRAVENOUS
  Filled 2022-06-08: qty 100

## 2022-06-08 MED ORDER — EPHEDRINE SULFATE-NACL 50-0.9 MG/10ML-% IV SOSY
PREFILLED_SYRINGE | INTRAVENOUS | Status: DC | PRN
  Administered 2022-06-08: 5 mg via INTRAVENOUS

## 2022-06-08 MED ORDER — INSULIN ASPART 100 UNIT/ML IJ SOLN
0.0000 [IU] | Freq: Three times a day (TID) | INTRAMUSCULAR | Status: DC
  Administered 2022-06-08: 11 [IU] via SUBCUTANEOUS
  Administered 2022-06-09: 3 [IU] via SUBCUTANEOUS

## 2022-06-08 MED ORDER — SUMATRIPTAN SUCCINATE 25 MG PO TABS
25.0000 mg | ORAL_TABLET | ORAL | Status: DC | PRN
  Filled 2022-06-08: qty 1

## 2022-06-08 MED ORDER — MOMETASONE FURO-FORMOTEROL FUM 200-5 MCG/ACT IN AERO
2.0000 | INHALATION_SPRAY | Freq: Two times a day (BID) | RESPIRATORY_TRACT | Status: DC
  Administered 2022-06-08 – 2022-06-09 (×2): 2 via RESPIRATORY_TRACT
  Filled 2022-06-08: qty 8.8

## 2022-06-08 MED ORDER — METHOCARBAMOL 500 MG IVPB - SIMPLE MED
INTRAVENOUS | Status: AC
  Filled 2022-06-08: qty 55

## 2022-06-08 MED ORDER — METOCLOPRAMIDE HCL 5 MG PO TABS: 5.0000 mg | ORAL_TABLET | Freq: Three times a day (TID) | ORAL | Status: DC | PRN

## 2022-06-08 MED ORDER — FENTANYL CITRATE PF 50 MCG/ML IJ SOSY
25.0000 ug | PREFILLED_SYRINGE | INTRAMUSCULAR | Status: DC | PRN
  Administered 2022-06-08: 50 ug via INTRAVENOUS

## 2022-06-08 MED ORDER — POVIDONE-IODINE 10 % EX SWAB
2.0000 | Freq: Once | CUTANEOUS | Status: AC
  Administered 2022-06-08: 2 via TOPICAL

## 2022-06-08 MED ORDER — HYDROMORPHONE HCL 1 MG/ML IJ SOLN
INTRAMUSCULAR | Status: DC | PRN
  Administered 2022-06-08 (×2): .6 mg via INTRAVENOUS
  Administered 2022-06-08: .4 mg via INTRAVENOUS

## 2022-06-08 MED ORDER — HYDROMORPHONE HCL 2 MG/ML IJ SOLN
INTRAMUSCULAR | Status: AC
  Filled 2022-06-08: qty 1

## 2022-06-08 MED ORDER — DEXAMETHASONE SODIUM PHOSPHATE 10 MG/ML IJ SOLN
8.0000 mg | Freq: Once | INTRAMUSCULAR | Status: AC
  Administered 2022-06-08: 8 mg via INTRAVENOUS

## 2022-06-08 MED ORDER — MENTHOL 3 MG MT LOZG: 1.0000 | LOZENGE | OROMUCOSAL | Status: DC | PRN

## 2022-06-08 MED ORDER — CITALOPRAM HYDROBROMIDE 20 MG PO TABS
40.0000 mg | ORAL_TABLET | Freq: Every day | ORAL | Status: DC
  Administered 2022-06-09: 40 mg via ORAL
  Filled 2022-06-08: qty 2

## 2022-06-08 MED ORDER — ROCURONIUM BROMIDE 10 MG/ML (PF) SYRINGE
PREFILLED_SYRINGE | INTRAVENOUS | Status: AC
  Filled 2022-06-08: qty 10

## 2022-06-08 MED ORDER — BUSPIRONE HCL 5 MG PO TABS: 5.0000 mg | ORAL_TABLET | Freq: Three times a day (TID) | ORAL | Status: DC | PRN

## 2022-06-08 MED ORDER — ONDANSETRON HCL 4 MG/2ML IJ SOLN
INTRAMUSCULAR | Status: DC | PRN
  Administered 2022-06-08: 4 mg via INTRAVENOUS

## 2022-06-08 MED ORDER — MONTELUKAST SODIUM 10 MG PO TABS
10.0000 mg | ORAL_TABLET | Freq: Every day | ORAL | Status: DC
  Administered 2022-06-08: 10 mg via ORAL
  Filled 2022-06-08: qty 1

## 2022-06-08 MED ORDER — ASPIRIN 325 MG PO TBEC
325.0000 mg | DELAYED_RELEASE_TABLET | Freq: Two times a day (BID) | ORAL | Status: DC
  Administered 2022-06-09: 325 mg via ORAL
  Filled 2022-06-08: qty 1

## 2022-06-08 MED ORDER — METHOCARBAMOL 500 MG IVPB - SIMPLE MED
500.0000 mg | Freq: Four times a day (QID) | INTRAVENOUS | Status: DC | PRN
  Filled 2022-06-08: qty 55

## 2022-06-08 MED ORDER — ROSUVASTATIN CALCIUM 20 MG PO TABS
40.0000 mg | ORAL_TABLET | Freq: Every day | ORAL | Status: DC
  Administered 2022-06-09: 40 mg via ORAL
  Filled 2022-06-08: qty 2

## 2022-06-08 MED ORDER — FENTANYL CITRATE PF 50 MCG/ML IJ SOSY
PREFILLED_SYRINGE | INTRAMUSCULAR | Status: AC
  Filled 2022-06-08: qty 1

## 2022-06-08 MED ORDER — ONDANSETRON HCL 4 MG/2ML IJ SOLN: 4.0000 mg | Freq: Four times a day (QID) | INTRAMUSCULAR | Status: DC | PRN

## 2022-06-08 MED ORDER — SODIUM CHLORIDE 0.9 % IV SOLN: INTRAVENOUS | Status: DC

## 2022-06-08 MED ORDER — METOCLOPRAMIDE HCL 5 MG/ML IJ SOLN: 5.0000 mg | Freq: Three times a day (TID) | INTRAMUSCULAR | Status: DC | PRN

## 2022-06-08 MED ORDER — SUGAMMADEX SODIUM 200 MG/2ML IV SOLN
INTRAVENOUS | Status: DC | PRN
  Administered 2022-06-08: 200 mg via INTRAVENOUS

## 2022-06-08 SURGICAL SUPPLY — 43 items
BAG COUNTER SPONGE SURGICOUNT (BAG) IMPLANT
BAG DECANTER FOR FLEXI CONT (MISCELLANEOUS) IMPLANT
BAG SPEC THK2 15X12 ZIP CLS (MISCELLANEOUS)
BAG SPNG CNTER NS LX DISP (BAG) ×1
BAG ZIPLOCK 12X15 (MISCELLANEOUS) IMPLANT
BLADE SAG 18X100X1.27 (BLADE) ×1 IMPLANT
COVER PERINEAL POST (MISCELLANEOUS) ×1 IMPLANT
COVER SURGICAL LIGHT HANDLE (MISCELLANEOUS) ×1 IMPLANT
CUP ACETBLR 48 OD SECTOR II (Hips) IMPLANT
DRAPE FOOT SWITCH (DRAPES) ×1 IMPLANT
DRAPE STERI IOBAN 125X83 (DRAPES) ×1 IMPLANT
DRAPE U-SHAPE 47X51 STRL (DRAPES) ×2 IMPLANT
DRSG AQUACEL AG ADV 3.5X10 (GAUZE/BANDAGES/DRESSINGS) ×1 IMPLANT
DURAPREP 26ML APPLICATOR (WOUND CARE) ×1 IMPLANT
ELECT REM PT RETURN 15FT ADLT (MISCELLANEOUS) ×1 IMPLANT
GLOVE BIO SURGEON STRL SZ 6.5 (GLOVE) IMPLANT
GLOVE BIO SURGEON STRL SZ7.5 (GLOVE) IMPLANT
GLOVE BIO SURGEON STRL SZ8 (GLOVE) ×1 IMPLANT
GLOVE BIOGEL PI IND STRL 6.5 (GLOVE) IMPLANT
GLOVE BIOGEL PI IND STRL 7.0 (GLOVE) IMPLANT
GLOVE BIOGEL PI IND STRL 8 (GLOVE) ×1 IMPLANT
GOWN STRL REUS W/ TWL LRG LVL3 (GOWN DISPOSABLE) ×1 IMPLANT
GOWN STRL REUS W/ TWL XL LVL3 (GOWN DISPOSABLE) IMPLANT
GOWN STRL REUS W/TWL LRG LVL3 (GOWN DISPOSABLE) ×2
GOWN STRL REUS W/TWL XL LVL3 (GOWN DISPOSABLE)
HEAD FEM STD 28X+1.5 STRL (Hips) IMPLANT
HOLDER FOLEY CATH W/STRAP (MISCELLANEOUS) ×1 IMPLANT
KIT TURNOVER KIT A (KITS) IMPLANT
LINER MARATHON 28 48 (Hips) IMPLANT
MANIFOLD NEPTUNE II (INSTRUMENTS) ×1 IMPLANT
PACK ANTERIOR HIP CUSTOM (KITS) ×1 IMPLANT
PENCIL SMOKE EVACUATOR COATED (MISCELLANEOUS) ×1 IMPLANT
SPIKE FLUID TRANSFER (MISCELLANEOUS) ×1 IMPLANT
STEM FEMORAL SZ 5MM STD ACTIS (Stem) IMPLANT
STRIP CLOSURE SKIN 1/2X4 (GAUZE/BANDAGES/DRESSINGS) ×1 IMPLANT
SUT ETHIBOND NAB CT1 #1 30IN (SUTURE) ×1 IMPLANT
SUT MNCRL AB 4-0 PS2 18 (SUTURE) ×1 IMPLANT
SUT STRATAFIX 0 PDS 27 VIOLET (SUTURE) ×1
SUT VIC AB 2-0 CT1 27 (SUTURE) ×2
SUT VIC AB 2-0 CT1 TAPERPNT 27 (SUTURE) ×2 IMPLANT
SUTURE STRATFX 0 PDS 27 VIOLET (SUTURE) ×1 IMPLANT
TRAY FOLEY MTR SLVR 16FR STAT (SET/KITS/TRAYS/PACK) ×1 IMPLANT
TUBE SUCTION HIGH CAP CLEAR NV (SUCTIONS) ×1 IMPLANT

## 2022-06-08 NOTE — Op Note (Signed)
OPERATIVE REPORT- TOTAL HIP ARTHROPLASTY   PREOPERATIVE DIAGNOSIS: Osteoarthritis of the Left hip.   POSTOPERATIVE DIAGNOSIS: Osteoarthritis of the Left  hip.   PROCEDURE: Left total hip arthroplasty, anterior approach.   SURGEON: Gaynelle Arabian, MD   ASSISTANT: Theresa Duty, PA-C  ANESTHESIA:  General  ESTIMATED BLOOD LOSS:-350 mL    DRAINS: None  COMPLICATIONS: None   CONDITION: PACU - hemodynamically stable.   BRIEF CLINICAL NOTE: Lori Potts is a 76 y.o. female who has advanced end-  stage arthritis of their Left  hip with progressively worsening pain and  dysfunction.The patient has failed nonoperative management and presents for  total hip arthroplasty.   PROCEDURE IN DETAIL: After successful administration of spinal  anesthetic, the traction boots for the [facility] bed were placed on both  feet and the patient was placed onto the [facility] bed, boots placed into the leg  holders. The Left hip was then isolated from the perineum with plastic  drapes and prepped and draped in the usual sterile fashion. ASIS and  greater trochanter were marked and a oblique incision was made, starting  at about 1 cm lateral and 2 cm distal to the ASIS and coursing towards  the anterior cortex of the femur. The skin was cut with a 10 blade  through subcutaneous tissue to the level of the fascia overlying the  tensor fascia lata muscle. The fascia was then incised in line with the  incision at the junction of the anterior third and posterior 2/3rd. The  muscle was teased off the fascia and then the interval between the TFL  and the rectus was developed. The Hohmann retractor was then placed at  the top of the femoral neck over the capsule. The vessels overlying the  capsule were cauterized and the fat on top of the capsule was removed.  A Hohmann retractor was then placed anterior underneath the rectus  femoris to give exposure to the entire anterior capsule. A T-shaped   capsulotomy was performed. The edges were tagged and the femoral head  was identified.       Osteophytes are removed off the superior acetabulum.  The femoral neck was then cut in situ with an oscillating saw. Traction  was then applied to the left lower extremity utilizing the [facility]  traction. The femoral head was then removed. Retractors were placed  around the acetabulum and then circumferential removal of the labrum was  performed. Osteophytes were also removed. Reaming starts at 454 mm to  medialize and  Increased in 2 mm increments to 47 mm. We reamed in  approximately 40 degrees of abduction, 20 degrees anteversion. A 48 mm  pinnacle acetabular shell was then impacted in anatomic position under  fluoroscopic guidance with excellent purchase. We did not need to place  any additional dome screws. A 28 mm neutral + 4 marathon liner was then  placed into the acetabular shell.       The femoral lift was then placed along the lateral aspect of the femur  just distal to the vastus ridge. The leg was  externally rotated and capsule  was stripped off the inferior aspect of the femoral neck down to the  level of the lesser trochanter, this was done with electrocautery. The femur was lifted after this was performed. The  leg was then placed in an extended and adducted position essentially delivering the femur. We also removed the capsule superiorly and the piriformis from the piriformis fossa to  gain excellent exposure of the  proximal femur. Rongeur was used to remove some cancellous bone to get  into the lateral portion of the proximal femur for placement of the  initial starter reamer. The starter broaches was placed  the starter broach  and was shown to go down the center of the canal. Broaching  with the Actis system was then performed starting at size 0  coursing  Up to size 3. A size 3 had excellent torsional and rotational  and axial stability. The trial standard offset neck was then  placed  with a 28 + 1.5 trial head. The hip was then reduced. We confirmed that  the stem was in the canal both on AP and lateral x-rays. It also has excellent sizing. The hip was reduced with outstanding stability through full extension and full external rotation.. AP pelvis was taken and the leg lengths were measured and found to be equal. Hip was then dislocated again and the femoral head and neck removed. The  femoral broach was removed. Size 3 Actis stem with a standard offset  neck was then impacted into the femur following native anteversion. Has  excellent purchase in the canal. Excellent torsional and rotational and  axial stability. It is confirmed to be in the canal on AP and lateral  fluoroscopic views. The 28+ 1.5 ceramic head was placed and the hip  reduced with outstanding stability. Again AP pelvis was taken and it  confirmed that the leg lengths were equal. The wound was then copiously  irrigated with saline solution and the capsule reattached and repaired  with Ethibond suture. 30 ml of .25% Bupivicaine was  injected into the capsule and into the edge of the tensor fascia lata as well as subcutaneous tissue. The fascia overlying the tensor fascia lata was then closed with a running #1 V-Loc. Subcu was closed with interrupted 2-0 Vicryl and subcuticular running 4-0 Monocryl. Incision was cleaned  and dried. Steri-Strips and a bulky sterile dressing applied. The patient was awakened and transported to  recovery in stable condition.        Please note that a surgical assistant was a medical necessity for this procedure to perform it in a safe and expeditious manner. Assistant was necessary to provide appropriate retraction of vital neurovascular structures and to prevent femoral fracture and allow for anatomic placement of the prosthesis.  Gaynelle Arabian, M.D.

## 2022-06-08 NOTE — Anesthesia Procedure Notes (Addendum)
Procedure Name: Intubation Date/Time: 06/08/2022 9:44 AM  Performed by: Milford Cage, CRNAPre-anesthesia Checklist: Patient identified, Emergency Drugs available, Suction available and Patient being monitored Patient Re-evaluated:Patient Re-evaluated prior to induction Oxygen Delivery Method: Circle system utilized Preoxygenation: Pre-oxygenation with 100% oxygen Induction Type: IV induction Ventilation: Mask ventilation without difficulty Laryngoscope Size: Miller and 2 Grade View: Grade I Tube type: Oral Tube size: 7.0 mm Number of attempts: 1 Airway Equipment and Method: Stylet Placement Confirmation: ETT inserted through vocal cords under direct vision, positive ETCO2 and breath sounds checked- equal and bilateral Secured at: 21 cm Tube secured with: Tape Dental Injury: Teeth and Oropharynx as per pre-operative assessment

## 2022-06-08 NOTE — Evaluation (Signed)
Physical Therapy Evaluation Patient Details Name: Lori Potts MRN: [id number] DOB: October 10, 1945 Today's Date: 06/08/2022  History of Present Illness  Pt is 76 yo female s/p L anterior THA on 06/08/22.  Pt with hx including arthritis, anxiety, bil TKA, low back pain, DM2, HLD, back surgery (multiple)  Clinical Impression  Pt is s/p THA resulting in the deficits listed below (see PT Problem List). At baseline, pt independent and ambulatory without AD.  She has RW and BSC at home, spouse will be there to assist, but she does have 8 Steps to enter home with bil rails but can only reach 1 at a time.  Today, pt was very lethargic at evaluation (general anesthesia and RN reports Fentanyl in PACU).   However, pt very uncomfortable in bed so proceeded with PT evaluation.  Pt requiring max cues for alertness and sequencing but  only min physical assist for transfers and pivot to chair.  Pain relieved once in chair.  Pt expected to progress well as lethargy improves. Pt will benefit from skilled PT to increase their independence and safety with mobility to allow discharge to the venue listed below.         Recommendations for follow up therapy are one component of a multi-disciplinary discharge planning process, led by the attending physician.  Recommendations may be updated based on patient status, additional functional criteria and insurance authorization.  Follow Up Recommendations Follow physician's recommendations for discharge plan and follow up therapies      Assistance Recommended at Discharge Frequent or constant Supervision/Assistance  Patient can return home with the following  A little help with walking and/or transfers;A little help with bathing/dressing/bathroom;Assistance with cooking/housework;Help with stairs or ramp for entrance    Equipment Recommendations None recommended by PT  Recommendations for Other Services       Functional Status Assessment Patient has had a recent  decline in their functional status and demonstrates the ability to make significant improvements in function in a reasonable and predictable amount of time.     Precautions / Restrictions Precautions Precautions: Fall Restrictions Weight Bearing Restrictions: Yes LLE Weight Bearing: Weight bearing as tolerated      Mobility  Bed Mobility Overal bed mobility: Needs Assistance Bed Mobility: Supine to Sit     Supine to sit: Min assist, HOB elevated     General bed mobility comments: max cues    Transfers Overall transfer level: Needs assistance Equipment used: Rolling walker (2 wheels) Transfers: Sit to/from Stand, Bed to chair/wheelchair/BSC Sit to Stand: Min assist Stand pivot transfers: Min assist         General transfer comment: Sit to stand x 4 during session - kept returning to sitting due to burning with standing, but reports wants to get in chair; eventually able to pivot toward R side with increased time and cues and RW use    Ambulation/Gait               General Gait Details: unable today - lethargy/pain  Stairs            Wheelchair Mobility    Modified Rankin (Stroke Patients Only)       Balance Overall balance assessment: Needs assistance Sitting-balance support: No upper extremity supported Sitting balance-Leahy Scale: Good     Standing balance support: Bilateral upper extremity supported, Reliant on assistive device for balance Standing balance-Leahy Scale: Poor Standing balance comment: RW and min guard  Pertinent Vitals/Pain Pain Assessment Pain Assessment: 0-10 Pain Score: 0-No pain (9/10 in bed and movement; 0/10 in chair) Pain Location: L hip Pain Descriptors / Indicators: Discomfort, Sore, Aching, Burning Pain Intervention(s): Limited activity within patient's tolerance, Monitored during session, Premedicated before session    [place] expects to be discharged  to:: Private residence Living Arrangements: Spouse/significant other Available Help at Discharge: Family;Available 24 hours/day Type of Home: House Home Access: Stairs to enter Entrance Stairs-Rails: Psychiatric nurse of Steps: 8   Home Layout: One level Home Equipment: Conservation officer, nature (2 wheels);Cane - single point;BSC/3in1;Grab bars - tub/shower      Prior Function Prior Level of Function : Independent/Modified Independent             Mobility Comments: could ambulate in community without AD ADLs Comments: independent with adls and iadls     Hand Dominance   Dominant Hand: Right    Extremity/Trunk Assessment   Upper Extremity Assessment Upper Extremity Assessment: Overall WFL for tasks assessed    Lower Extremity Assessment Lower Extremity Assessment: LLE deficits/detail;RLE deficits/detail RLE Deficits / Details: ROM WFL; MMT 5/5 LLE Deficits / Details: ROM WFL; MMT 5/5 ankle; 2/5 hip and knee    Cervical / Trunk Assessment Cervical / Trunk Assessment: Normal  Communication   Communication: No difficulties  Cognition Arousal/Alertness: Lethargic, Suspect due to medications Behavior During Therapy: WFL for tasks assessed/performed Overall Cognitive Status: Impaired/Different from baseline Area of Impairment: Orientation                 Orientation Level: Disoriented to, Place, Time, Situation             General Comments: suspected related to medicine and anesthesia (general); very lethargic; not aware she had her surgery; frequently asking about great-grandson that they normally pick up around this time        General Comments General comments (skin integrity, edema, etc.): family present    Exercises     Assessment/Plan    PT Assessment Patient needs continued PT services  PT Problem List Decreased strength;Decreased mobility;Decreased safety awareness;Decreased knowledge of precautions;Decreased activity  tolerance;Decreased cognition;Decreased balance;Decreased knowledge of use of DME;Pain       PT Treatment Interventions DME instruction;Therapeutic activities;Modalities;Gait training;Therapeutic exercise;Patient/family education;Balance training;Stair training;Functional mobility training    PT Goals (Current goals can be found in the Care Plan section)  Acute Rehab PT Goals Patient Stated Goal: return home PT Goal Formulation: With patient/family Time For Goal Achievement: 06/22/22 Potential to Achieve Goals: Good    Frequency 7X/week     Co-evaluation               AM-PAC PT "6 Clicks" Mobility  Outcome Measure Help needed turning from your back to your side while in a flat bed without using bedrails?: A Little Help needed moving from lying on your back to sitting on the side of a flat bed without using bedrails?: A Little Help needed moving to and from a bed to a chair (including a wheelchair)?: A Little Help needed standing up from a chair using your arms (e.g., wheelchair or bedside chair)?: A Little Help needed to walk in hospital room?: Total Help needed climbing 3-5 steps with a railing? : Total 6 Click Score: 14    End of Session Equipment Utilized During Treatment: Gait belt Activity Tolerance: Patient limited by lethargy Patient left: with chair alarm set;in chair;with call bell/phone within reach;with family/visitor present Nurse Communication: Mobility status PT Visit Diagnosis: Other abnormalities  of gait and mobility (R26.89);Muscle weakness (generalized) (M62.81)    Time: 7322-0254 PT Time Calculation (min) (ACUTE ONLY): 34 min   Charges:   PT Evaluation $PT Eval Moderate Complexity: 1 Mod PT Treatments $Therapeutic Activity: 8-22 mins        Anise Salvo, PT Acute Rehab [facility] [phone]   Rayetta Humphrey 06/08/2022, 2:41 PM

## 2022-06-08 NOTE — Transfer of Care (Signed)
Immediate Anesthesia Transfer of Care Note  Patient: Chauncey Cruel Ailes  Procedure(s) Performed: TOTAL HIP ARTHROPLASTY ANTERIOR APPROACH (Left: Hip)  Patient Location: PACU  Anesthesia Type:General  Level of Consciousness: sedated  Airway & Oxygen Therapy: Patient Spontanous Breathing and Patient connected to face mask oxygen  Post-op Assessment: Report given to RN and Post -op Vital signs reviewed and stable  Post vital signs: Reviewed and stable  Last Vitals:  Vitals Value Taken Time  BP 132/60 06/08/22 1145  Temp    Pulse 60 06/08/22 1147  Resp 9 06/08/22 1147  SpO2 100 % 06/08/22 1147  Vitals shown include unvalidated device data.  Last Pain:  Vitals:   06/08/22 0812  TempSrc:   PainSc: 8       Patients Stated Pain Goal: 5 (65/99/35 7017)  Complications: No notable events documented.

## 2022-06-08 NOTE — Interval H&P Note (Signed)
History and Physical Interval Note:  06/08/2022 8:15 AM  Lori Potts  has presented today for surgery, with the diagnosis of left hip osteoarthritis.  The various methods of treatment have been discussed with the patient and family. After consideration of risks, benefits and other options for treatment, the patient has consented to  Procedure(s): TOTAL HIP ARTHROPLASTY ANTERIOR APPROACH (Left) as a surgical intervention.  The patient's history has been reviewed, patient examined, no change in status, stable for surgery.  I have reviewed the patient's chart and labs.  Questions were answered to the patient's satisfaction.     Pilar Plate Nikolaj Geraghty

## 2022-06-08 NOTE — Plan of Care (Signed)
Plan of care reviewed and discussed with the patient and her family. 

## 2022-06-08 NOTE — Discharge Instructions (Signed)
Gaynelle Arabian, MD Total Joint Specialist [facility] [place] [address] [place], [place] [address] [phone]  ANTERIOR APPROACH TOTAL HIP REPLACEMENT POSTOPERATIVE DIRECTIONS     Hip Rehabilitation, Guidelines Following Surgery  The results of a hip operation are greatly improved after range of motion and muscle strengthening exercises. Follow all safety measures which are given to protect your hip. If any of these exercises cause increased pain or swelling in your joint, decrease the amount until you are comfortable again. Then slowly increase the exercises. Call your caregiver if you have problems or questions.   HOME CARE INSTRUCTIONS  Remove items at home which could result in a fall. This includes throw rugs or furniture in walking pathways.  ICE to the affected hip as frequently as 20-30 minutes an hour and then as needed for pain and swelling. Continue to use ice on the hip for pain and swelling from surgery. You may notice swelling that will progress down to the foot and ankle. This is normal after surgery. Elevate the leg when you are not up walking on it.   Continue to use the breathing machine which will help keep your temperature down.  It is common for your temperature to cycle up and down following surgery, especially at night when you are not up moving around and exerting yourself.  The breathing machine keeps your lungs expanded and your temperature down.  BLOOD CLOT PREVENTION Take a 325 mg Aspirin two times a day for three weeks following surgery. Then resume one 81 mg Aspirin once a day. You may resume your vitamins/supplements upon discharge from the hospital. Do not take any NSAIDs (Advil, Aleve, Ibuprofen, Meloxicam, etc.) until you have discontinued the 325 mg Aspirin.   DIET You may resume your previous home diet once your are discharged from the hospital.  DRESSING / WOUND CARE / SHOWERING You have an adhesive waterproof bandage over  the incision. Leave this in place until your first follow-up appointment. Once you remove this you will not need to place another bandage.  You may begin showering 3 days following surgery, but do not submerge the incision under water.  ACTIVITY For the first 3-5 days, it is important to rest and keep the operative leg elevated. You should, as a general rule, rest for 50 minutes and walk/stretch for 10 minutes per hour. After 5 days, you may slowly increase activity as tolerated.  Perform the exercises you were provided twice a day for about 15-20 minutes each session. Begin these 2 days following surgery. Walk with your walker as instructed. Use the walker until you are comfortable transitioning to a cane. Walk with the cane in the opposite hand of the operative leg. You may discontinue the cane once you are comfortable and walking steadily. Avoid periods of inactivity such as sitting longer than an hour when not asleep. This helps prevent blood clots.  Do not drive a car for 6 weeks or until released by your surgeon.  Do not drive while taking narcotics.  TED HOSE STOCKINGS Wear the elastic stockings on both legs for three weeks following surgery during the day. You may remove them at night while sleeping.  WEIGHT BEARING Weight bearing as tolerated with assist device (walker, cane, etc) as directed, use it as long as suggested by your surgeon or therapist, typically at least 4-6 weeks.  POSTOPERATIVE CONSTIPATION PROTOCOL Constipation - defined medically as fewer than three stools per week and severe constipation as less than one stool per  week.  One of the most common issues patients have following surgery is constipation.  Even if you have a regular bowel pattern at home, your normal regimen is likely to be disrupted due to multiple reasons following surgery.  Combination of anesthesia, postoperative narcotics, change in appetite and fluid intake all can affect your bowels.  In order to  avoid complications following surgery, here are some recommendations in order to help you during your recovery period.  Colace (docusate) - Pick up an over-the-counter form of Colace or another stool softener and take twice a day as long as you are requiring postoperative pain medications.  Take with a full glass of water daily.  If you experience loose stools or diarrhea, hold the colace until you stool forms back up.  If your symptoms do not get better within 1 week or if they get worse, check with your doctor. Dulcolax (bisacodyl) - Pick up over-the-counter and take as directed by the product packaging as needed to assist with the movement of your bowels.  Take with a full glass of water.  Use this product as needed if not relieved by Colace only.  MiraLax (polyethylene glycol) - Pick up over-the-counter to have on hand.  MiraLax is a solution that will increase the amount of water in your bowels to assist with bowel movements.  Take as directed and can mix with a glass of water, juice, soda, coffee, or tea.  Take if you go more than two days without a movement.Do not use MiraLax more than once per day. Call your doctor if you are still constipated or irregular after using this medication for 7 days in a row.  If you continue to have problems with postoperative constipation, please contact the office for further assistance and recommendations.  If you experience "the worst abdominal pain ever" or develop nausea or vomiting, please contact the office immediatly for further recommendations for treatment.  ITCHING  If you experience itching with your medications, try taking only a single pain pill, or even half a pain pill at a time.  You can also use Benadryl over the counter for itching or also to help with sleep.   MEDICATIONS See your medication summary on the "After Visit Summary" that the nursing staff will review with you prior to discharge.  You may have some home medications which will be placed  on hold until you complete the course of blood thinner medication.  It is important for you to complete the blood thinner medication as prescribed by your surgeon.  Continue your approved medications as instructed at time of discharge.  PRECAUTIONS If you experience chest pain or shortness of breath - call 911 immediately for transfer to the hospital emergency department.  If you develop a fever greater that 101 F, purulent drainage from wound, increased redness or drainage from wound, foul odor from the wound/dressing, or calf pain - CONTACT YOUR SURGEON.                                                   FOLLOW-UP APPOINTMENTS Make sure you keep all of your appointments after your operation with your surgeon and caregivers. You should call the office at the above phone number and make an appointment for approximately two weeks after the date of your surgery or on the date instructed by your surgeon  outlined in the "After Visit Summary".  RANGE OF MOTION AND STRENGTHENING EXERCISES  These exercises are designed to help you keep full movement of your hip joint. Follow your caregiver's or physical therapist's instructions. Perform all exercises about fifteen times, three times per day or as directed. Exercise both hips, even if you have had only one joint replacement. These exercises can be done on a training (exercise) mat, on the floor, on a table or on a bed. Use whatever works the best and is most comfortable for you. Use music or television while you are exercising so that the exercises are a pleasant break in your day. This will make your life better with the exercises acting as a break in routine you can look forward to.  Lying on your back, slowly slide your foot toward your buttocks, raising your knee up off the floor. Then slowly slide your foot back down until your leg is straight again.  Lying on your back spread your legs as far apart as you can without causing discomfort.  Lying on your  side, raise your upper leg and foot straight up from the floor as far as is comfortable. Slowly lower the leg and repeat.  Lying on your back, tighten up the muscle in the front of your thigh (quadriceps muscles). You can do this by keeping your leg straight and trying to raise your heel off the floor. This helps strengthen the largest muscle supporting your knee.  Lying on your back, tighten up the muscles of your buttocks both with the legs straight and with the knee bent at a comfortable angle while keeping your heel on the floor.   POST-OPERATIVE OPIOID TAPER INSTRUCTIONS: It is important to wean off of your opioid medication as soon as possible. If you do not need pain medication after your surgery it is ok to stop day one. Opioids include: Codeine, Hydrocodone(Norco, Vicodin), Oxycodone(Percocet, oxycontin) and hydromorphone amongst others.  Long term and even short term use of opiods can cause: Increased pain response Dependence Constipation Depression Respiratory depression And more.  Withdrawal symptoms can include Flu like symptoms Nausea, vomiting And more Techniques to manage these symptoms Hydrate well Eat regular healthy meals Stay active Use relaxation techniques(deep breathing, meditating, yoga) Do Not substitute Alcohol to help with tapering If you have been on opioids for less than two weeks and do not have pain than it is ok to stop all together.  Plan to wean off of opioids This plan should start within one week post op of your joint replacement. Maintain the same interval or time between taking each dose and first decrease the dose.  Cut the total daily intake of opioids by one tablet each day Next start to increase the time between doses. The last dose that should be eliminated is the evening dose.   IF YOU ARE TRANSFERRED TO A SKILLED REHAB FACILITY If the patient is transferred to a skilled rehab facility following release from the hospital, a list of the  current medications will be sent to the facility for the patient to continue.  When discharged from the skilled rehab facility, please have the facility set up the patient's [place] prior to being released. Also, the skilled facility will be responsible for providing the patient with their medications at time of release from the facility to include their pain medication, the muscle relaxants, and their blood thinner medication. If the patient is still at the rehab facility at time of the two  week follow up appointment, the skilled rehab facility will also need to assist the patient in arranging follow up appointment in our office and any transportation needs.  MAKE SURE YOU:  Understand these instructions.  Get help right away if you are not doing well or get worse.    DENTAL ANTIBIOTICS:  In most cases prophylactic antibiotics for Dental procdeures after total joint surgery are not necessary.  Exceptions are as follows:  1. History of prior total joint infection  2. Severely immunocompromised (Organ Transplant, cancer chemotherapy, Rheumatoid biologic meds such as Humera)  3. Poorly controlled diabetes (A1C &gt; 8.0, blood glucose over 200)  If you have one of these conditions, contact your surgeon for an antibiotic prescription, prior to your dental procedure.    Pick up stool softner and laxative for home use following surgery while on pain medications. Do not submerge incision under water. Please use good hand washing techniques while changing dressing each day. May shower starting three days after surgery. Please use a clean towel to pat the incision dry following showers. Continue to use ice for pain and swelling after surgery. Do not use any lotions or creams on the incision until instructed by your surgeon.

## 2022-06-08 NOTE — Anesthesia Postprocedure Evaluation (Signed)
Anesthesia Post Note  Patient: Lori Potts  Procedure(s) Performed: TOTAL HIP ARTHROPLASTY ANTERIOR APPROACH (Left: Hip)     Patient location during evaluation: PACU Anesthesia Type: General Level of consciousness: awake and alert Pain management: pain level controlled Vital Signs Assessment: post-procedure vital signs reviewed and stable Respiratory status: spontaneous breathing, nonlabored ventilation, respiratory function stable and patient connected to nasal cannula oxygen Cardiovascular status: blood pressure returned to baseline and stable Postop Assessment: no apparent nausea or vomiting Anesthetic complications: no   No notable events documented.  Last Vitals:  Vitals:   06/08/22 1521 06/08/22 1707  BP: 137/60 (!) 148/63  Pulse: (!) 57 68  Resp: 14 18  Temp: (!) 36.4 C 36.7 C  SpO2: 100% 96%    Last Pain:  Vitals:   06/08/22 1707  TempSrc: Oral  PainSc:                  March Rummage Simren Popson

## 2022-06-08 NOTE — Plan of Care (Signed)
  Problem: Education: Goal: Ability to describe self-care measures that may prevent or decrease complications (Diabetes Survival Skills Education) will improve Outcome: Progressing   Problem: Coping: Goal: Ability to adjust to condition or change in health will improve Outcome: Progressing   Problem: Fluid Volume: Goal: Ability to maintain a balanced intake and output will improve Outcome: Progressing   Problem: Clinical Measurements: Goal: Postoperative complications will be avoided or minimized Outcome: Progressing   Problem: Pain Management: Goal: Pain level will decrease with appropriate interventions Outcome: Progressing   Problem: Skin Integrity: Goal: Will show signs of wound healing Outcome: Progressing

## 2022-06-08 NOTE — Care Plan (Signed)
Ortho Bundle Case Management Note  Patient Details  Name: Lori Potts MRN: [id number] Date of Birth: 1945/12/27  L THA on 06-08-22 DCP:  Home with husband DME:  No needs, has a RW PT:  HEP                   DME Arranged:  N/A DME Agency:  NA  HH Arranged:  NA [place] Agency:  NA  Additional Comments: Please contact me with any questions of if this plan should need to change.  Marianne Sofia, RN,CCM [facility]  [phone] 06/08/2022, 9:46 AM

## 2022-06-09 ENCOUNTER — Encounter (HOSPITAL_COMMUNITY): Payer: Self-pay | Admitting: Orthopedic Surgery

## 2022-06-09 ENCOUNTER — Ambulatory Visit: Payer: Medicare HMO | Admitting: Nurse Practitioner

## 2022-06-09 DIAGNOSIS — I10 Essential (primary) hypertension: Secondary | ICD-10-CM | POA: Diagnosis not present

## 2022-06-09 DIAGNOSIS — E119 Type 2 diabetes mellitus without complications: Secondary | ICD-10-CM | POA: Diagnosis not present

## 2022-06-09 DIAGNOSIS — Z7985 Long-term (current) use of injectable non-insulin antidiabetic drugs: Secondary | ICD-10-CM | POA: Diagnosis not present

## 2022-06-09 DIAGNOSIS — M1612 Unilateral primary osteoarthritis, left hip: Secondary | ICD-10-CM | POA: Diagnosis not present

## 2022-06-09 DIAGNOSIS — J4520 Mild intermittent asthma, uncomplicated: Secondary | ICD-10-CM | POA: Diagnosis not present

## 2022-06-09 DIAGNOSIS — Z96653 Presence of artificial knee joint, bilateral: Secondary | ICD-10-CM | POA: Diagnosis not present

## 2022-06-09 DIAGNOSIS — Z87891 Personal history of nicotine dependence: Secondary | ICD-10-CM | POA: Diagnosis not present

## 2022-06-09 DIAGNOSIS — Z79899 Other long term (current) drug therapy: Secondary | ICD-10-CM | POA: Diagnosis not present

## 2022-06-09 LAB — BASIC METABOLIC PANEL
Anion gap: 9 (ref 5–15)
BUN: 21 mg/dL (ref 8–23)
CO2: 22 mmol/L (ref 22–32)
Calcium: 9.3 mg/dL (ref 8.9–10.3)
Chloride: 107 mmol/L (ref 98–111)
Creatinine, Ser: 1.04 mg/dL — ABNORMAL HIGH (ref 0.44–1.00)
GFR, Estimated: 56 mL/min — ABNORMAL LOW (ref >60)
Glucose, Bld: 202 mg/dL — ABNORMAL HIGH (ref 70–99)
Potassium: 3.9 mmol/L (ref 3.5–5.1)
Sodium: 138 mmol/L (ref 135–145)

## 2022-06-09 LAB — CBC
HCT: 33.6 % — ABNORMAL LOW (ref 36.0–46.0)
Hemoglobin: 10.8 g/dL — ABNORMAL LOW (ref 12.0–15.0)
MCH: 26.5 pg (ref 26.0–34.0)
MCHC: 32.1 g/dL (ref 30.0–36.0)
MCV: 82.6 fL (ref 80.0–100.0)
Platelets: 307 10*3/uL (ref 150–400)
RBC: 4.07 MIL/uL (ref 3.87–5.11)
RDW: 14.4 % (ref 11.5–15.5)
WBC: 13.7 10*3/uL — ABNORMAL HIGH (ref 4.0–10.5)
nRBC: 0 % (ref 0.0–0.2)

## 2022-06-09 LAB — GLUCOSE, CAPILLARY: Glucose-Capillary: 163 mg/dL — ABNORMAL HIGH (ref 70–99)

## 2022-06-09 MED ORDER — ASPIRIN 325 MG PO TBEC: 325.0000 mg | DELAYED_RELEASE_TABLET | Freq: Two times a day (BID) | ORAL | 0 refills | Status: AC

## 2022-06-09 MED ORDER — OXYCODONE HCL 5 MG PO TABS: 5.0000 mg | ORAL_TABLET | ORAL | 0 refills | Status: DC | PRN

## 2022-06-09 MED ORDER — METHOCARBAMOL 500 MG PO TABS: 500.0000 mg | ORAL_TABLET | Freq: Four times a day (QID) | ORAL | 0 refills | Status: DC | PRN

## 2022-06-09 NOTE — Progress Notes (Signed)
Subjective: 1 Day Post-Op Procedure(s) (LRB): TOTAL HIP ARTHROPLASTY ANTERIOR APPROACH (Left) Patient reports pain as moderate.   Patient seen in rounds by Dr. Wynelle Link. Patient had issues with pain last night, mostly in her back. On chronic pain medication for this. Improved slightly this morning. States she would like to discharge home today if possible. Denies chest pain or SOB. We will continue therapy today.   Objective: Vital signs in last 24 hours: Temp:  [96.5 F (35.8 C)-98.2 F (36.8 C)] 98.2 F (36.8 C) (10/19 0551) Pulse Rate:  [50-73] 65 (10/19 0551) Resp:  [8-18] 17 (10/19 0551) BP: (128-166)/(59-95) 166/68 (10/19 0551) SpO2:  [94 %-100 %] 98 % (10/19 0551) Weight:  [84.4 kg] 84.4 kg (10/18 0812)  Intake/Output from previous day:  Intake/Output Summary (Last 24 hours) at 06/09/2022 0728 Last data filed at 06/08/2022 2300 Gross per 24 hour  Intake 1778.29 ml  Output 1275 ml  Net 503.29 ml     Intake/Output this shift: No intake/output data recorded.  Labs: Recent Labs    06/09/22 0334  HGB 10.8*   Recent Labs    06/09/22 0334  WBC 13.7*  RBC 4.07  HCT 33.6*  PLT 307   Recent Labs    06/09/22 0334  NA 138  K 3.9  CL 107  CO2 22  BUN 21  CREATININE 1.04*  GLUCOSE 202*  CALCIUM 9.3   No results for input(s): "LABPT", "INR" in the last 72 hours.  Exam: General - Patient is Alert and Oriented Extremity - Neurologically intact Neurovascular intact Sensation intact distally Dorsiflexion/Plantar flexion intact Dressing - dressing C/D/I Motor Function - intact, moving foot and toes well on exam.   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   Cataract    right eye but immature   Essential hypertension, benign    takes Lisinopril-HCTZ daily   GERD (gastroesophageal reflux disease)    Headache    sinus   History of colon polyps    benign   History of migraine     History of shingles    Hyperlipidemia    takes Lipitor daily   Low back pain    Nocturia    S/P insertion of spinal cord stimulator    Seasonal allergies    takes Singulair daily as needed   Type II or unspecified type diabetes mellitus without mention of complication, not stated as uncontrolled    Vitamin D deficiency    takes Vit D weekly   Weakness    numbness and tingling left arm   Wears dentures    full upper and lower    Assessment/Plan: 1 Day Post-Op Procedure(s) (LRB): TOTAL HIP ARTHROPLASTY ANTERIOR APPROACH (Left) Principal Problem:   OA (osteoarthritis) of hip Active Problems:   Primary osteoarthritis of left hip  Estimated body mass index is 32.43 kg/m as calculated from the following:   Height as of this encounter: 5' 3.5" (1.613 m).   Weight as of this encounter: 84.4 kg. Advance diet Up with therapy D/C IV fluids  DVT Prophylaxis - Aspirin Weight bearing as tolerated. Continue therapy.  Plan is to go Home after hospital stay. Possible discharge to home today if progresses with therapy and pain controlled with HEP. Follow-up in the office in 2 weeks  The PDMP database was reviewed today prior to any opioid medications being prescribed to this patient.  Theresa Duty,  PA-C Orthopedic Surgery [phone] 06/09/2022, 7:28 AM

## 2022-06-09 NOTE — Progress Notes (Signed)
Physical Therapy Treatment Patient Details Name: Lori Potts MRN: [id number] DOB: [birth year] Today's Date: 06/09/2022   History of Present Illness Pt is 76 yo female s/p L anterior THA on 06/08/22.  Pt with hx including arthritis, anxiety, bil TKA, low back pain, DM2, HLD, back surgery (multiple)    PT Comments    Pt is POD #1 s/p  L anterior THA resulting in the deficits listed below (see PT Problem List). Pt performed sit to stand transfers with MIN guard for safety and cues for safe hand placement. Pt ambulated total of ~127ft with MIN guard progressing to supervision for safety and use of RW. Cues provided for step to gait patten with no LOB observed. Pt able to perform stair negotiation with single railing and HHA MIN guard,education on proper guarding position for family members when assisting at home- handouts provided. PT reviewed LE HEP for promotion of DVT prevention and improved strength/ROM, pt demonstrated understanding. Pt will have assist from her spouse and daughter upon d/c. Pt will benefit from continued skilled PT to maximize functional mobility and increase independence.     Recommendations for follow up therapy are one component of a multi-disciplinary discharge planning process, led by the attending physician.  Recommendations may be updated based on patient status, additional functional criteria and insurance authorization.  Follow Up Recommendations  Follow physician's recommendations for discharge plan and follow up therapies     Assistance Recommended at Discharge Intermittent Supervision/Assistance  Patient can return home with the following A little help with walking and/or transfers;A little help with bathing/dressing/bathroom;Assistance with cooking/housework;Help with stairs or ramp for entrance   Equipment Recommendations  None recommended by PT    Recommendations for Other Services       Precautions / Restrictions Restrictions Weight Bearing  Restrictions: No LLE Weight Bearing: Weight bearing as tolerated     Mobility  Bed Mobility Overal bed mobility: Needs Assistance Bed Mobility: Supine to Sit     Supine to sit: Min assist     General bed mobility comments: HOB flat; assist for L LE; education on use of gait belt to assist with progression of L LE to EOB.    Transfers Overall transfer level: Needs assistance Equipment used: Rolling walker (2 wheels) Transfers: Sit to/from Stand Sit to Stand: Min guard           General transfer comment: cues for hand placement    Ambulation/Gait Ambulation/Gait assistance: Min guard Gait Distance (Feet): 130 Feet Assistive device: Rolling walker (2 wheels) Gait Pattern/deviations: Step-through pattern, Decreased stride length, Decreased weight shift to left, Knee flexed in stance - left, Antalgic Gait velocity: decr     General Gait Details: no overt LOB observed, decreased weight shift to L with mild knee flexion during ambulation. Progressed to supervision.   Stairs Stairs: Yes Stairs assistance: Min assist Stair Management: One rail Right, Step to pattern, Forwards Number of Stairs: 5 General stair comments: use of R rail and HHA on opposite sides, cues for sequencing. Pt familiar with sequencing due to history of knee surgery. Discussed use of HHA from spouse or daugter who will be available to assist or use of cane and rail to provide B UE stabillity- handout provided and discussed with family as well.   Wheelchair Mobility    Modified Rankin (Stroke Patients Only)       Balance Overall balance assessment: Needs assistance Sitting-balance support: Feet supported, No upper extremity supported Sitting balance-Leahy Scale: Good     Standing  balance support: Bilateral upper extremity supported, Reliant on assistive device for balance Standing balance-Leahy Scale: Poor Standing balance comment: RW and min guard                             Cognition Arousal/Alertness: Awake/alert Behavior During Therapy: WFL for tasks assessed/performed Overall Cognitive Status: Within Functional Limits for tasks assessed                                          Exercises Total Joint Exercises Ankle Circles/Pumps: AROM, Both, 20 reps, Seated Quad Sets: AROM, Left, 10 reps, Seated Short Arc Quad: AROM, Left, 10 reps, Seated Heel Slides: Left, 10 reps, Seated, AAROM (use of gait belt to assist) Hip ABduction/ADduction: AAROM, Left, 10 reps, Seated Long Arc Quad: AROM, Left, 10 reps, Seated    General Comments        Pertinent Vitals/Pain Pain Assessment Pain Assessment: 0-10 Pain Score: 6  Pain Location: L hip Pain Descriptors / Indicators: Discomfort, Sore, Aching, Burning Pain Intervention(s): Limited activity within patient's tolerance, Monitored during session, Repositioned    Home Living                          Prior Function            PT Goals (current goals can now be found in the care plan section) Acute Rehab PT Goals PT Goal Formulation: With patient/family Time For Goal Achievement: 06/22/22 Potential to Achieve Goals: Good Progress towards PT goals: Progressing toward goals    Frequency    7X/week      PT Plan Current plan remains appropriate    Co-evaluation              AM-PAC PT "6 Clicks" Mobility   Outcome Measure  Help needed turning from your back to your side while in a flat bed without using bedrails?: A Little Help needed moving from lying on your back to sitting on the side of a flat bed without using bedrails?: A Little Help needed moving to and from a bed to a chair (including a wheelchair)?: A Little Help needed standing up from a chair using your arms (e.g., wheelchair or bedside chair)?: A Little Help needed to walk in hospital room?: A Little Help needed climbing 3-5 steps with a railing? : A Little 6 Click Score: 18    End of Session  Equipment Utilized During Treatment: Gait belt Activity Tolerance: Patient tolerated treatment well Patient left: in chair;with call bell/phone within reach;with chair alarm set;with family/visitor present Nurse Communication: Mobility status PT Visit Diagnosis: Other abnormalities of gait and mobility (R26.89);Muscle weakness (generalized) (M62.81)     Time: 1028-1100 PT Time Calculation (min) (ACUTE ONLY): 32 min  Charges:  $Therapeutic Exercise: 8-22 mins $Therapeutic Activity: 8-22 mins                    Festus Barren PT, DPT  Acute Rehabilitation Services  Office [phone]  06/09/2022, 2:07 PM

## 2022-06-09 NOTE — Progress Notes (Signed)
Patient discharged to home w/ family. Given all belongings, isntructions. Verbalized understanding of instructions. Escorted to pov via w/c.

## 2022-06-09 NOTE — TOC Transition Note (Signed)
Transition of Care [facility]) - CM/SW Discharge Note   Patient Details  Name: Lori Potts MRN: [id number] Date of Birth: 1946-06-29  Transition of [facility]) CM/SW Contact:  Lennart Pall, LCSW Phone Number: 06/09/2022, 9:44 AM   Clinical Narrative:     Met with pt and confirming she has all needed DME at home.  Plan for HEP.  No TOC needs.  Final next level of care: Home/Self Care Barriers to Discharge: No Barriers Identified   Patient Goals and CMS Choice Patient states their goals for this hospitalization and ongoing recovery are:: return home      Discharge Placement                       Discharge Plan and Services                DME Arranged: N/A DME Agency: NA       HH Arranged: NA HH Agency: NA        Social Determinants of Health (SDOH) Interventions     Readmission Risk Interventions     No data to display

## 2022-06-13 ENCOUNTER — Telehealth: Payer: Self-pay

## 2022-06-13 DIAGNOSIS — R809 Proteinuria, unspecified: Secondary | ICD-10-CM

## 2022-06-13 DIAGNOSIS — N1831 Chronic kidney disease, stage 3a: Secondary | ICD-10-CM

## 2022-06-13 DIAGNOSIS — E1129 Type 2 diabetes mellitus with other diabetic kidney complication: Secondary | ICD-10-CM

## 2022-06-13 NOTE — Progress Notes (Signed)
Chronic Care Management Pharmacy Assistant   Name: Lori Potts  MRN: [id number] DOB: 1946-05-25  Reason for Encounter: Medication Review/Patient assistance application for Farxiga.   Recent office visits:  None ID  Recent consult visits:  None ID  Hospital visits:  Medication Reconciliation was completed by comparing discharge summary, patient's EMR and Pharmacy list, and upon discussion with patient.  Admitted to the hospital on 06/08/2022 due to Osteoarthritis. Discharge date was 06/09/2022. Discharged from [facility]?Medications Started at [facility] Discharge:?? -started Oxycodone 10 mg 5x daily   Medication Changes at Hospital Discharge: -Changed None ID  Medications Discontinued at Hospital Discharge: -Stopped None ID  Medications that remain the same after Hospital Discharge:??  -All other medications will remain the same.    Medications: Outpatient Encounter Medications as of 06/13/2022  Medication Sig   albuterol (VENTOLIN HFA) 108 (90 Base) MCG/ACT inhaler Inhale 2 puffs into the lungs every 6 (six) hours as needed for wheezing or shortness of breath.   aspirin EC 325 MG tablet Take 1 tablet (325 mg total) by mouth 2 (two) times daily for 20 days. Then resume one 81 mg aspirin once a day.   azelastine (ASTELIN) 0.1 % nasal spray PLACE 1 SPRAY INTO BOTH NOSTRILS 2 (TWO) TIMES DAILY. USE IN EACH NOSTRIL AS DIRECTED (Patient not taking: Reported on 05/25/2022)   budesonide-formoterol (SYMBICORT) 160-4.5 MCG/ACT inhaler Inhale 2 puffs into the lungs 2 (two) times daily. Patient receives via [employer] Patient Assistance.   busPIRone (BUSPAR) 5 MG tablet TAKE 1 TABLET BY MOUTH 3 TIMES DAILY AS NEEDED.   citalopram (CELEXA) 40 MG tablet Take 1 tablet (40 mg total) by mouth daily.   dapagliflozin propanediol (FARXIGA) 5 MG TABS tablet Take 1 tablet (5 mg total) by mouth daily before breakfast.   docusate sodium (COLACE) 100 MG capsule Take 100 mg by mouth  2 (two) times daily.   doxycycline (VIBRA-TABS) 100 MG tablet Take 1 tablet (100 mg total) by mouth 2 (two) times daily. (Patient not taking: Reported on 05/25/2022)   Dulaglutide (TRULICITY) [id number] [id number] SOPN Inject 0.75 mg into the skin once a week. Patient receives via [employer] Patient Assistance through Dec 2023 (Patient taking differently: Inject 0.75 mg into the skin every Thursday. Patient receives via [employer] Patient Assistance through Dec 2023)   esomeprazole (NEXIUM) 40 MG capsule Take 40 mg by mouth at bedtime.   fluticasone (FLONASE) 50 MCG/ACT nasal spray PLACE 2 SPRAYS INTO BOTH NOSTRILS EVERY EVENING. (Patient not taking: Reported on 05/25/2022)   loratadine (CLARITIN) 10 MG tablet TAKE 1 TABLET BY MOUTH TWICE A DAY (Patient taking differently: Take 10 mg by mouth daily.)   losartan (COZAAR) 25 MG tablet Take 1 tablet (25 mg total) by mouth daily.   methocarbamol (ROBAXIN) 500 MG tablet Take 1 tablet (500 mg total) by mouth every 6 (six) hours as needed for muscle spasms.   montelukast (SINGULAIR) 10 MG tablet TAKE 1 TABLET BY MOUTH EVERYDAY AT BEDTIME   Multiple Vitamins-Minerals (MULTIVITAMIN WITH MINERALS) tablet Take 1 tablet by mouth daily.   oxyCODONE (OXY IR/ROXICODONE) 5 MG immediate release tablet Take 1-2 tablets (5-10 mg total) by mouth every 4 (four) hours as needed for breakthrough pain (Breakthrough pain not responding to chronic 10 mg oxycodone 5x daily).   Oxycodone HCl 10 MG TABS Take 10 mg by mouth 5 (five) times daily as needed (pain).   promethazine (PHENERGAN) 12.5 MG tablet Take 1 tablet (12.5 mg  total) by mouth every 8 (eight) hours as needed for nausea or vomiting.   propranolol (INDERAL) 20 MG tablet TAKE 1 TABLET BY MOUTH THREE TIMES A DAY (Patient taking differently: Take 20 mg by mouth 2 (two) times daily.)   rosuvastatin (CRESTOR) 40 MG tablet TAKE 1 TABLET BY MOUTH EVERYDAY AT BEDTIME   sodium chloride (OCEAN) 0.65 % SOLN nasal spray Place 1 spray  into both nostrils as needed for congestion.   SUMAtriptan (IMITREX) 25 MG tablet Take 1 tablet (25 mg total) by mouth every 2 (two) hours as needed for migraine (ongoing headache). Maximum daily dose 200mg    No facility-administered encounter medications on file as of 06/13/2022.    Care Gaps: Shingrix Vaccine Opthalmology exam  COVID-19 Vaccine     Star Rating Drugs: Rosuvastatin 40 mg last filled 03/06/2022 for 90 day supply at [facility] Losartan 25 mg last filled 03/14/2022 for 90 day supply at [facility] Trulicity 7.09 mg last filled 11/30/2020 for 84 day supply at [facility] (receives from [employer])   Medication Fill Gaps: None ID  I received a task from Junius Argyle, CPP requesting that I start the application for patient assistance on the medication Farxiga.    Spoke with the patient and she gave verbal consent to complete the online application via phone. Patient was approve with a start date of 06/13/2022 and a end date of 08/22/2023. Task sent to clinical pharmacist to send in prescription to [facility] as I will confirm with them in one week that they have receive it and submitted it.   Patient reports she is doing "pretty good" after her surgery besides the pain and discomfort.Patient states she is feeling better day by day.   [facility] Pharmacist Assistant [phone]

## 2022-06-14 NOTE — Discharge Summary (Signed)
Patient ID: Lori Potts MRN: [id number] DOB/AGE: Jul 23, 1946 76 y.o.  Admit date: 06/08/2022 Discharge date: 06/09/2022  Admission Diagnoses:  Principal Problem:   OA (osteoarthritis) of hip Active Problems:   Primary osteoarthritis of left hip   Discharge Diagnoses:  Same  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   Cataract    right eye but immature   Essential hypertension, benign    takes Lisinopril-HCTZ daily   GERD (gastroesophageal reflux disease)    Headache    sinus   History of colon polyps    benign   History of migraine    History of shingles    Hyperlipidemia    takes Lipitor daily   Low back pain    Nocturia    S/P insertion of spinal cord stimulator    Seasonal allergies    takes Singulair daily as needed   Type II or unspecified type diabetes mellitus without mention of complication, not stated as uncontrolled    Vitamin D deficiency    takes Vit D weekly   Weakness    numbness and tingling left arm   Wears dentures    full upper and lower    Surgeries: Procedure(s): TOTAL HIP ARTHROPLASTY ANTERIOR APPROACH on 06/08/2022   Consultants:   Discharged Condition: Improved  Hospital Course: Lori Potts is an 76 y.o. female who was admitted 06/08/2022 for operative treatment ofOA (osteoarthritis) of hip. Patient has severe unremitting pain that affects sleep, daily activities, and work/hobbies. After pre-op clearance the patient was taken to the operating room on 06/08/2022 and underwent  Procedure(s): TOTAL HIP ARTHROPLASTY ANTERIOR APPROACH.    Patient was given perioperative antibiotics:  Anti-infectives (From admission, onward)    Start     Dose/Rate Route Frequency Ordered Stop   06/08/22 1600  ceFAZolin (ANCEF) IVPB 2g/100 mL premix        2 g 200 mL/hr over 30 Minutes Intravenous Every 6 hours 06/08/22 1309 06/08/22 2213   06/08/22 0800  ceFAZolin (ANCEF)  IVPB 2g/100 mL premix        2 g 200 mL/hr over 30 Minutes Intravenous On call to O.R. 06/08/22 0749 06/08/22 1016        Patient was given sequential compression devices, early ambulation, and chemoprophylaxis to prevent DVT.  Patient benefited maximally from hospital stay and there were no complications.    Recent vital signs: No data found.   Recent laboratory studies: No results for input(s): "WBC", "HGB", "HCT", "PLT", "NA", "K", "CL", "CO2", "BUN", "CREATININE", "GLUCOSE", "INR", "CALCIUM" in the last 72 hours.  Invalid input(s): "PT", "2"   Discharge Medications:   Allergies as of 06/09/2022       Reactions   Metformin And Related Diarrhea   Adhesive [tape] Rash, Other (See Comments)   Regular tape is ok, allergy is to paper tape        Medication List     STOP taking these medications    meloxicam 7.5 MG tablet Commonly known as: MOBIC       TAKE these medications    albuterol 108 (90 Base) MCG/ACT inhaler Commonly known as: VENTOLIN HFA Inhale 2 puffs into the lungs every 6 (six) hours as needed for wheezing or shortness of breath.   aspirin EC 325 MG tablet Take 1 tablet (325 mg total) by mouth 2 (two) times daily for 20 days. Then resume one 37  mg aspirin once a day. What changed:  medication strength how much to take when to take this additional instructions   azelastine 0.1 % nasal spray Commonly known as: ASTELIN PLACE 1 SPRAY INTO BOTH NOSTRILS 2 (TWO) TIMES DAILY. USE IN EACH NOSTRIL AS DIRECTED   budesonide-formoterol 160-4.5 MCG/ACT inhaler Commonly known as: SYMBICORT Inhale 2 puffs into the lungs 2 (two) times daily. Patient receives via [employer] Patient Assistance.   busPIRone 5 MG tablet Commonly known as: BUSPAR TAKE 1 TABLET BY MOUTH 3 TIMES DAILY AS NEEDED.   citalopram 40 MG tablet Commonly known as: CELEXA Take 1 tablet (40 mg total) by mouth daily.   dapagliflozin propanediol 5 MG Tabs tablet Commonly known as:  FARXIGA Take 1 tablet (5 mg total) by mouth daily before breakfast.   docusate sodium 100 MG capsule Commonly known as: COLACE Take 100 mg by mouth 2 (two) times daily.   doxycycline 100 MG tablet Commonly known as: VIBRA-TABS Take 1 tablet (100 mg total) by mouth 2 (two) times daily.   esomeprazole 40 MG capsule Commonly known as: NEXIUM Take 40 mg by mouth at bedtime.   fluticasone 50 MCG/ACT nasal spray Commonly known as: FLONASE PLACE 2 SPRAYS INTO BOTH NOSTRILS EVERY EVENING.   loratadine 10 MG tablet Commonly known as: CLARITIN TAKE 1 TABLET BY MOUTH TWICE A DAY What changed: when to take this   losartan 25 MG tablet Commonly known as: COZAAR Take 1 tablet (25 mg total) by mouth daily.   methocarbamol 500 MG tablet Commonly known as: ROBAXIN Take 1 tablet (500 mg total) by mouth every 6 (six) hours as needed for muscle spasms.   montelukast 10 MG tablet Commonly known as: SINGULAIR TAKE 1 TABLET BY MOUTH EVERYDAY AT BEDTIME   multivitamin with minerals tablet Take 1 tablet by mouth daily.   Oxycodone HCl 10 MG Tabs Take 10 mg by mouth 5 (five) times daily as needed (pain). What changed: Another medication with the same name was added. Make sure you understand how and when to take each.   oxyCODONE 5 MG immediate release tablet Commonly known as: Oxy IR/ROXICODONE Take 1-2 tablets (5-10 mg total) by mouth every 4 (four) hours as needed for breakthrough pain (Breakthrough pain not responding to chronic 10 mg oxycodone 5x daily). What changed: You were already taking a medication with the same name, and this prescription was added. Make sure you understand how and when to take each.   promethazine 12.5 MG tablet Commonly known as: PHENERGAN Take 1 tablet (12.5 mg total) by mouth every 8 (eight) hours as needed for nausea or vomiting.   propranolol 20 MG tablet Commonly known as: INDERAL TAKE 1 TABLET BY MOUTH THREE TIMES A DAY What changed: when to take  this   rosuvastatin 40 MG tablet Commonly known as: CRESTOR TAKE 1 TABLET BY MOUTH EVERYDAY AT BEDTIME   sodium chloride 0.65 % Soln nasal spray Commonly known as: OCEAN Place 1 spray into both nostrils as needed for congestion.   SUMAtriptan 25 MG tablet Commonly known as: Imitrex Take 1 tablet (25 mg total) by mouth every 2 (two) hours as needed for migraine (ongoing headache). Maximum daily dose 200mg    Trulicity 1.51 VO/1.6WV Sopn Generic drug: Dulaglutide Inject 0.75 mg into the skin once a week. Patient receives via [employer] Patient Assistance through Dec 2023 What changed: when to take this               Discharge Care Instructions  (  From admission, onward)           Start     Ordered   06/09/22 0000  Weight bearing as tolerated        06/09/22 0734   06/09/22 0000  Change dressing       Comments: You have an adhesive waterproof bandage over the incision. Leave this in place until your first follow-up appointment. Once you remove this you will not need to place another bandage.   06/09/22 0734            Diagnostic Studies: DG Pelvis Portable  Result Date: 06/08/2022 CLINICAL DATA:  Postop total left hip arthroplasty. EXAM: PORTABLE PELVIS 1-2 VIEWS COMPARISON:  Radiographs 01/23/2020 FINDINGS: Well seated components of a total left hip arthroplasty. No complicating features are identified. IMPRESSION: Well seated components of a total left hip arthroplasty. Electronically Signed   By: Rudie Meyer M.D.   On: 06/08/2022 12:29   DG HIP UNILAT WITH PELVIS 1V LEFT  Result Date: 06/08/2022 CLINICAL DATA:  Left total hip arthroplasty EXAM: DG HIP (WITH OR WITHOUT PELVIS) 1V*L* COMPARISON:  None Available. FINDINGS: Fluoroscopic images were obtained intraoperatively and submitted for post operative interpretation. Left total total arthroplasty with hardware in expected position, 7 images were obtained with seconds of fluoroscopy time and 1.58 mGy. Please  see the performing provider's procedural report for further detail. IMPRESSION: Fluoroscopic images of left total hip arthroplasty. Electronically Signed   By: Allegra Lai M.D.   On: 06/08/2022 11:28   DG C-Arm 1-60 Min-No Report  Result Date: 06/08/2022 Fluoroscopy was utilized by the requesting physician.  No radiographic interpretation.   DG C-Arm 1-60 Min-No Report  Result Date: 06/08/2022 Fluoroscopy was utilized by the requesting physician.  No radiographic interpretation.    Disposition: Discharge disposition: 01-Home or Self Care       Discharge Instructions     Call MD / Call 911   Complete by: As directed    If you experience chest pain or shortness of breath, CALL 911 and be transported to the hospital emergency room.  If you develope a fever above 101 F, pus (white drainage) or increased drainage or redness at the wound, or calf pain, call your surgeon's office.   Change dressing   Complete by: As directed    You have an adhesive waterproof bandage over the incision. Leave this in place until your first follow-up appointment. Once you remove this you will not need to place another bandage.   Constipation Prevention   Complete by: As directed    Drink plenty of fluids.  Prune juice may be helpful.  You may use a stool softener, such as Colace (over the counter) 100 mg twice a day.  Use MiraLax (over the counter) for constipation as needed.   Diet - low sodium heart healthy   Complete by: As directed    Do not sit on low chairs, stoools or toilet seats, as it may be difficult to get up from low surfaces   Complete by: As directed    Driving restrictions   Complete by: As directed    No driving for two weeks   Post-operative opioid taper instructions:   Complete by: As directed    POST-OPERATIVE OPIOID TAPER INSTRUCTIONS: It is important to wean off of your opioid medication as soon as possible. If you do not need pain medication after your surgery it is ok to  stop day one. Opioids include: Codeine, Hydrocodone(Norco, Vicodin), Oxycodone(Percocet, oxycontin)  and hydromorphone amongst others.  Long term and even short term use of opiods can cause: Increased pain response Dependence Constipation Depression Respiratory depression And more.  Withdrawal symptoms can include Flu like symptoms Nausea, vomiting And more Techniques to manage these symptoms Hydrate well Eat regular healthy meals Stay active Use relaxation techniques(deep breathing, meditating, yoga) Do Not substitute Alcohol to help with tapering If you have been on opioids for less than two weeks and do not have pain than it is ok to stop all together.  Plan to wean off of opioids This plan should start within one week post op of your joint replacement. Maintain the same interval or time between taking each dose and first decrease the dose.  Cut the total daily intake of opioids by one tablet each day Next start to increase the time between doses. The last dose that should be eliminated is the evening dose.      TED hose   Complete by: As directed    Use stockings (TED hose) for three weeks on both leg(s).  You may remove them at night for sleeping.   Weight bearing as tolerated   Complete by: As directed         Follow-up Information     Ollen Gross, MD. Go on 06/21/2022.   Specialty: Orthopedic Surgery Why: You are scheduled for a follow up appointment on 06-21-22 at 3:00 pm. Contact information: [address] [place] [address] [phone]                  Signed: Arther Abbott 06/14/2022, 6:52 AM

## 2022-06-15 ENCOUNTER — Other Ambulatory Visit: Payer: Self-pay

## 2022-06-15 ENCOUNTER — Telehealth (INDEPENDENT_AMBULATORY_CARE_PROVIDER_SITE_OTHER): Payer: Medicare HMO | Admitting: Nurse Practitioner

## 2022-06-15 ENCOUNTER — Encounter: Payer: Self-pay | Admitting: Nurse Practitioner

## 2022-06-15 DIAGNOSIS — R051 Acute cough: Secondary | ICD-10-CM | POA: Diagnosis not present

## 2022-06-15 DIAGNOSIS — J0111 Acute recurrent frontal sinusitis: Secondary | ICD-10-CM | POA: Diagnosis not present

## 2022-06-15 MED ORDER — AZITHROMYCIN 250 MG PO TABS: ORAL_TABLET | ORAL | 0 refills | Status: AC

## 2022-06-15 NOTE — Progress Notes (Signed)
Name: Lori Potts   MRN: [id number]    DOB: 02-06-1946   Date:06/15/2022       Progress Note  Subjective  Chief Complaint  Chief Complaint  Patient presents with   Cough    Throat raspy, coughing up mucous, runny nose. Covid negative    I connected with  [name]  on 06/15/22 at  9:40 AM EDT by a video enabled telemedicine application and verified that I am speaking with the correct person using two identifiers.  I discussed the limitations of evaluation and management by telemedicine and the availability of in person appointments. The patient expressed understanding and agreed to proceed with a virtual visit  Staff also discussed with the patient that there may be a patient responsible charge related to this service. Patient Location: home Provider Location: [facility] Additional Individuals present: alone  HPI  Sinus infection/cough:  Patient reports she has a cough, sinus congestion and facial pressure. She says it has been going on for 8 days ago.  She denies any fever or shortness of breath. She says home covid test was negative.  She says she does not tolerate steroids and that zpack is what will help her. Discussed with patient that it is not recommend to take antibiotics so often. Patient continues to say that is all that will work for her.  She says she is already taking Claritin, Flonase, Singulair, mucinex and Symbicort.  Discussed with patient that I recommend she continue to treat symptoms. Will send in zpack but would not take unless no improvement in the next few days.  Continue to push fluids.  Patient Active Problem List   Diagnosis Date Noted   OA (osteoarthritis) of hip 06/08/2022   Primary osteoarthritis of left hip 06/08/2022   Mild intermittent asthma without complication 62/69/4854   Anxiety 04/07/2022   Shortness of breath 04/07/2022   Palpitations 04/07/2022   Mild episode of recurrent major depressive disorder ([place]) 04/07/2022   Moderate persistent  asthma without complication 62/70/3500   Neutropenia, unspecified type ([place]) 11/25/2020   Intractable migraine with aura without status migrainosus 04/18/2020   Body mass index (BMI) 31.0-31.9, adult 02/06/2020   Chronic left hip pain 12/26/2019   Anemia 09/22/2017   S/P knee replacement 07/28/2017   Allergic rhinitis 05/26/2017   Vitamin D deficiency 05/19/2017   Chronic nonintractable headache 05/18/2017   Advanced care planning/counseling discussion    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[place]) 03/02/2017   Insomnia 11/29/2016   Chronic right-sided low back pain with right-sided sciatica 04/19/2016   Abdominal wall pain in right flank 03/29/2016   Diverticulosis 02/24/2016   Type 2 diabetes mellitus with microalbuminuria, without long-term current use of insulin ([place]) 06/16/2015   Hyperlipidemia associated with type 2 diabetes mellitus ([place]) 06/16/2015   Chronic pain 04/13/2015   Neck pain 02/26/2015   Status post lumbar surgery 02/26/2015   Abnormal ECG 01/27/2015   Angina pectoris ([place]) 01/27/2015   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[place]) 01/27/2015   Essential (primary) hypertension 01/27/2015    Social History   Tobacco Use   Smoking status: Former    Types: Cigarettes    Quit date: 2006    Years since quitting: 17.8   Smokeless tobacco: Never   Tobacco comments:    quit smoking 38yrs ago  Substance Use Topics   Alcohol use: No     Current Outpatient Medications:    albuterol (VENTOLIN HFA) 108 (90 Base) MCG/ACT inhaler, Inhale 2 puffs into  the lungs every 6 (six) hours as needed for wheezing or shortness of breath., Disp: 18 g, Rfl: 2   aspirin EC 325 MG tablet, Take 1 tablet (325 mg total) by mouth 2 (two) times daily for 20 days. Then resume one 81 mg aspirin once a day., Disp: 40 tablet, Rfl: 0   budesonide-formoterol (SYMBICORT) 160-4.5 MCG/ACT inhaler, Inhale 2 puffs into the lungs 2 (two) times daily. Patient receives via [employer] Patient  Assistance., Disp: , Rfl:    busPIRone (BUSPAR) 5 MG tablet, TAKE 1 TABLET BY MOUTH 3 TIMES DAILY AS NEEDED., Disp: 270 tablet, Rfl: 1   citalopram (CELEXA) 40 MG tablet, Take 1 tablet (40 mg total) by mouth daily., Disp: 90 tablet, Rfl: 1   dapagliflozin propanediol (FARXIGA) 5 MG TABS tablet, Take 1 tablet (5 mg total) by mouth daily before breakfast., Disp: 90 tablet, Rfl: 1   docusate sodium (COLACE) 100 MG capsule, Take 100 mg by mouth 2 (two) times daily., Disp: , Rfl:    Dulaglutide (TRULICITY) 0.75 MG/0.5ML SOPN, Inject 0.75 mg into the skin once a week. Patient receives via [employer] Patient Assistance through Dec 2023 (Patient taking differently: Inject 0.75 mg into the skin every Thursday. Patient receives via [employer] Patient Assistance through Dec 2023), Disp: , Rfl:    esomeprazole (NEXIUM) 40 MG capsule, Take 40 mg by mouth at bedtime., Disp: , Rfl:    loratadine (CLARITIN) 10 MG tablet, TAKE 1 TABLET BY MOUTH TWICE A DAY (Patient taking differently: Take 10 mg by mouth daily.), Disp: 90 tablet, Rfl: 3   losartan (COZAAR) 25 MG tablet, Take 1 tablet (25 mg total) by mouth daily., Disp: 90 tablet, Rfl: 3   methocarbamol (ROBAXIN) 500 MG tablet, Take 1 tablet (500 mg total) by mouth every 6 (six) hours as needed for muscle spasms., Disp: 40 tablet, Rfl: 0   montelukast (SINGULAIR) 10 MG tablet, TAKE 1 TABLET BY MOUTH EVERYDAY AT BEDTIME, Disp: 90 tablet, Rfl: 3   Multiple Vitamins-Minerals (MULTIVITAMIN WITH MINERALS) tablet, Take 1 tablet by mouth daily., Disp: , Rfl:    oxyCODONE (OXY IR/ROXICODONE) 5 MG immediate release tablet, Take 1-2 tablets (5-10 mg total) by mouth every 4 (four) hours as needed for breakthrough pain (Breakthrough pain not responding to chronic 10 mg oxycodone 5x daily)., Disp: 30 tablet, Rfl: 0   Oxycodone HCl 10 MG TABS, Take 10 mg by mouth 5 (five) times daily as needed (pain)., Disp: , Rfl:    promethazine (PHENERGAN) 12.5 MG tablet, Take 1 tablet (12.5 mg  total) by mouth every 8 (eight) hours as needed for nausea or vomiting., Disp: 30 tablet, Rfl: 0   propranolol (INDERAL) 20 MG tablet, TAKE 1 TABLET BY MOUTH THREE TIMES A DAY (Patient taking differently: Take 20 mg by mouth 2 (two) times daily.), Disp: 270 tablet, Rfl: 0   rosuvastatin (CRESTOR) 40 MG tablet, TAKE 1 TABLET BY MOUTH EVERYDAY AT BEDTIME, Disp: 90 tablet, Rfl: 1   sodium chloride (OCEAN) 0.65 % SOLN nasal spray, Place 1 spray into both nostrils as needed for congestion., Disp: , Rfl:    SUMAtriptan (IMITREX) 25 MG tablet, Take 1 tablet (25 mg total) by mouth every 2 (two) hours as needed for migraine (ongoing headache). Maximum daily dose 200mg , Disp: 30 tablet, Rfl: 0   azelastine (ASTELIN) 0.1 % nasal spray, PLACE 1 SPRAY INTO BOTH NOSTRILS 2 (TWO) TIMES DAILY. USE IN EACH NOSTRIL AS DIRECTED (Patient not taking: Reported on 05/25/2022), Disp: 90 mL, Rfl:  1   doxycycline (VIBRA-TABS) 100 MG tablet, Take 1 tablet (100 mg total) by mouth 2 (two) times daily. (Patient not taking: Reported on 05/25/2022), Disp: 14 tablet, Rfl: 0   fluticasone (FLONASE) 50 MCG/ACT nasal spray, PLACE 2 SPRAYS INTO BOTH NOSTRILS EVERY EVENING. (Patient not taking: Reported on 05/25/2022), Disp: 48 mL, Rfl: 1  Allergies  Allergen Reactions   Metformin And Related Diarrhea   Adhesive [Tape] Rash and Other (See Comments)    Regular tape is ok, allergy is to paper tape    I personally reviewed active problem list, medication list, allergies, notes from last encounter with the patient/caregiver today.  ROS  Constitutional: Negative for fever or weight change.  HEENT: positive for nasal congestion Respiratory: positive for cough and negative for  shortness of breath.   Cardiovascular: Negative for chest pain or palpitations.  Gastrointestinal: Negative for abdominal pain, no bowel changes.  Musculoskeletal: Negative for gait problem or joint swelling.  Skin: Negative for rash.  Neurological: Negative for  dizziness or headache.  No other specific complaints in a complete review of systems (except as listed in HPI above).   Objective  Virtual encounter, vitals not obtained.  There is no height or weight on file to calculate BMI.  Nursing Note and Vital Signs reviewed.  Physical Exam  Awake, alert and oriented x3 speaking in complete sentences  No results found for this or any previous visit (from the past 72 hour(s)).  Assessment & Plan  1. Acute cough Continue taking Mucinex, Claritin, Flonase, continue using Singulair, Symbicort albuterol as needed. - azithromycin (ZITHROMAX) 250 MG tablet; Take 2 tablets on day 1, then 1 tablet daily on days 2 through 5  Dispense: 6 tablet; Refill: 0  2. Acute recurrent frontal sinusitis Continue taking Mucinex, Claritin and Flonase - azithromycin (ZITHROMAX) 250 MG tablet; Take 2 tablets on day 1, then 1 tablet daily on days 2 through 5  Dispense: 6 tablet; Refill: 0   -Red flags and when to present for emergency care or RTC including fever >101.61F, chest pain, shortness of breath, new/worsening/un-resolving symptoms,  reviewed with patient at time of visit. Follow up and care instructions discussed and provided in AVS. - I discussed the assessment and treatment plan with the patient. The patient was provided an opportunity to ask questions and all were answered. The patient agreed with the plan and demonstrated an understanding of the instructions.  I provided 15 minutes of non-face-to-face time during this encounter.  [name], [name]

## 2022-06-17 MED ORDER — DAPAGLIFLOZIN PROPANEDIOL 5 MG PO TABS: 5.0000 mg | ORAL_TABLET | Freq: Every day | ORAL | 3 refills | Status: DC

## 2022-06-17 NOTE — Addendum Note (Signed)
Addended by: Daron Offer A on: 06/17/2022 02:48 PM   Modules accepted: Orders

## 2022-06-19 ENCOUNTER — Other Ambulatory Visit: Payer: Self-pay | Admitting: Nurse Practitioner

## 2022-06-19 DIAGNOSIS — E7800 Pure hypercholesterolemia, unspecified: Secondary | ICD-10-CM

## 2022-06-20 NOTE — Telephone Encounter (Signed)
Requested Prescriptions  Pending Prescriptions Disp Refills  . rosuvastatin (CRESTOR) 40 MG tablet [Pharmacy Med Name: ROSUVASTATIN CALCIUM 40 MG TAB] 90 tablet 1    Sig: TAKE 1 TABLET BY MOUTH EVERYDAY AT BEDTIME     Cardiovascular:  Antilipid - Statins 2 Failed - 06/19/2022 12:31 PM      Failed - Cr in normal range and within 360 days    Creat  Date Value Ref Range Status  04/07/2022 0.99 0.60 - 1.00 mg/dL Final   Creatinine, Ser  Date Value Ref Range Status  06/09/2022 1.04 (H) 0.44 - 1.00 mg/dL Final   Creatinine, Urine  Date Value Ref Range Status  05/31/2022 74 20 - 275 mg/dL Final         Failed - Lipid Panel in normal range within the last 12 months    Cholesterol  Date Value Ref Range Status  04/07/2022 168 <200 mg/dL Final   LDL Cholesterol (Calc)  Date Value Ref Range Status  04/07/2022 93 mg/dL (calc) Final    Comment:    Reference range: <100 . Desirable range <100 mg/dL for primary prevention;   <70 mg/dL for patients with CHD or diabetic patients  with > or = 2 CHD risk factors. Marland Kitchen LDL-C is now calculated using the Martin-Hopkins  calculation, which is a validated novel method providing  better accuracy than the Friedewald equation in the  estimation of LDL-C.  Cresenciano Genre et al. Annamaria Helling. 4462;863(81): 2061-2068  (http://education.[employer].com/faq/FAQ164)    HDL  Date Value Ref Range Status  04/07/2022 57 > OR = 50 mg/dL Final   Triglycerides  Date Value Ref Range Status  04/07/2022 85 <150 mg/dL Final         Passed - Patient is not pregnant      Passed - Valid encounter within last 12 months    Recent Outpatient Visits          5 days ago Acute cough   [place], [name]   4 weeks ago Preoperative examination   [facility] Bo Merino, [name]   1 month ago Pustular lesion   [facility] Steele Sizer, MD   2 months ago Shortness of breath   [place], [name]   3 months ago Intractable migraine without aura and without status migrainosus   [facility] Bo Merino, [place]

## 2022-07-05 ENCOUNTER — Telehealth: Payer: Self-pay

## 2022-07-05 NOTE — Progress Notes (Signed)
Patient states she has not received her shipment from [facility] for [facility].  I reach out to [facility] to check on her shipment. Per [employer], patient was not on auto refill, so they were waiting on the patient to request the shipment. [facility] switch the  patient status to be auto refill as requested, and patient should receive her shipment in 5-7 business days.  Patient is aware of delivery, and confirm she has enough supply on hand until she receives her shipment.  [name] Clinical Pharmacist Assistant [phone]

## 2022-07-06 ENCOUNTER — Telehealth: Payer: Medicare HMO

## 2022-07-11 ENCOUNTER — Telehealth (INDEPENDENT_AMBULATORY_CARE_PROVIDER_SITE_OTHER): Payer: Medicare HMO | Admitting: Nurse Practitioner

## 2022-07-11 ENCOUNTER — Encounter: Payer: Self-pay | Admitting: Nurse Practitioner

## 2022-07-11 ENCOUNTER — Other Ambulatory Visit: Payer: Self-pay

## 2022-07-11 DIAGNOSIS — H8309 Labyrinthitis, unspecified ear: Secondary | ICD-10-CM | POA: Diagnosis not present

## 2022-07-11 DIAGNOSIS — J069 Acute upper respiratory infection, unspecified: Secondary | ICD-10-CM

## 2022-07-11 MED ORDER — METHYLPREDNISOLONE 4 MG PO TBPK: ORAL_TABLET | ORAL | 0 refills | Status: DC

## 2022-07-11 MED ORDER — LORATADINE 10 MG PO TABS: 10.0000 mg | ORAL_TABLET | Freq: Every day | ORAL | 3 refills | Status: DC

## 2022-07-11 MED ORDER — HYDROCOD POLI-CHLORPHE POLI ER 10-8 MG/5ML PO SUER: 5.0000 mL | Freq: Two times a day (BID) | ORAL | 0 refills | Status: DC | PRN

## 2022-07-11 NOTE — Progress Notes (Signed)
Name: Lori Potts   MRN: [id number]    DOB: [birth year]   Date:07/11/2022       Progress Note  Subjective  Chief Complaint  Chief Complaint  Patient presents with   Cough   Dizziness    I connected with  [name]  on 07/11/22 at 11:40 AM EST by a video enabled telemedicine application and verified that I am speaking with the correct person using two identifiers.  I discussed the limitations of evaluation and management by telemedicine and the availability of in person appointments. The patient expressed understanding and agreed to proceed with a virtual visit  Staff also discussed with the patient that there may be a patient responsible charge related to this service. Patient Location: home Provider Location: [facility] Additional Individuals present: alone  HPI  Cough/ uri/dizziness: Patient reports she had a dizzy spell on Saturday after blowing her nose.  She says this has happened before.  She says it only happened once.  She says she has been coughing and congested for 4-5  days.  She denies any fever. She says her covid test was negative. She is currently taking Singulair, Flonase,  Symbicort, robitussin and albuterol. Discussed she had Claritin on her med list but she says she is not taking it.  Will send in refill. Patient was previously treated with  medrol dose pack for dizziness. Will send that in.. Also recommend patient start taking mucinex.  Will send in tussinex, since tessalon perls does not help patient. Have seen patient multiple times for same complaint.  Recommend patient reach out to pulmonology. She last saw them on 11/11/2021.  According to their noted she was supposed to follow up in 6-8 weeks. However patient has not gone back.   Patient Active Problem List   Diagnosis Date Noted   OA (osteoarthritis) of hip 06/08/2022   Primary osteoarthritis of left hip 06/08/2022   Mild intermittent asthma without complication 05/23/2022   Anxiety 04/07/2022   Shortness  of breath 04/07/2022   Palpitations 04/07/2022   Mild episode of recurrent major depressive disorder (HCC) 04/07/2022   Moderate persistent asthma without complication 02/16/2022   Neutropenia, unspecified type (HCC) 11/25/2020   Intractable migraine with aura without status migrainosus 04/18/2020   Body mass index (BMI) 31.0-31.9, adult 02/06/2020   Chronic left hip pain 12/26/2019   Anemia 09/22/2017   S/P knee replacement 07/28/2017   Allergic rhinitis 05/26/2017   Vitamin D deficiency 05/19/2017   Chronic nonintractable headache 05/18/2017   Advanced care planning/counseling discussion    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HCC) 03/02/2017   Insomnia 11/29/2016   Chronic right-sided low back pain with right-sided sciatica 04/19/2016   Abdominal wall pain in right flank 03/29/2016   Diverticulosis 02/24/2016   Type 2 diabetes mellitus with microalbuminuria, without long-term current use of insulin (HCC) 06/16/2015   Hyperlipidemia associated with type 2 diabetes mellitus (HCC) 06/16/2015   Chronic pain 04/13/2015   Neck pain 02/26/2015   Status post lumbar surgery 02/26/2015   Abnormal ECG 01/27/2015   Angina pectoris (HCC) 01/27/2015   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) 01/27/2015   Essential (primary) hypertension 01/27/2015    Social History   Tobacco Use   Smoking status: Former    Types: Cigarettes    Quit date: 2006    Years since quitting: 17.8   Smokeless tobacco: Never   Tobacco comments:    quit smoking 68yrs ago  Substance Use Topics   Alcohol use:  No     Current Outpatient Medications:    albuterol (VENTOLIN HFA) 108 (90 Base) MCG/ACT inhaler, Inhale 2 puffs into the lungs every 6 (six) hours as needed for wheezing or shortness of breath., Disp: 18 g, Rfl: 2   azelastine (ASTELIN) 0.1 % nasal spray, PLACE 1 SPRAY INTO BOTH NOSTRILS 2 (TWO) TIMES DAILY. USE IN EACH NOSTRIL AS DIRECTED, Disp: 90 mL, Rfl: 1   budesonide-formoterol  (SYMBICORT) 160-4.5 MCG/ACT inhaler, Inhale 2 puffs into the lungs 2 (two) times daily. Patient receives via [employer] Patient Assistance., Disp: , Rfl:    busPIRone (BUSPAR) 5 MG tablet, TAKE 1 TABLET BY MOUTH 3 TIMES DAILY AS NEEDED., Disp: 270 tablet, Rfl: 1   citalopram (CELEXA) 40 MG tablet, Take 1 tablet (40 mg total) by mouth daily., Disp: 90 tablet, Rfl: 1   dapagliflozin propanediol (FARXIGA) 5 MG TABS tablet, Take 1 tablet (5 mg total) by mouth daily before breakfast., Disp: 90 tablet, Rfl: 3   docusate sodium (COLACE) 100 MG capsule, Take 100 mg by mouth 2 (two) times daily., Disp: , Rfl:    Dulaglutide (TRULICITY) 0.75 MG/0.5ML SOPN, Inject 0.75 mg into the skin once a week. Patient receives via [employer] Patient Assistance through Dec 2023 (Patient taking differently: Inject 0.75 mg into the skin every Thursday. Patient receives via [employer] Patient Assistance through Dec 2023), Disp: , Rfl:    esomeprazole (NEXIUM) 40 MG capsule, Take 40 mg by mouth at bedtime., Disp: , Rfl:    loratadine (CLARITIN) 10 MG tablet, TAKE 1 TABLET BY MOUTH TWICE A DAY (Patient taking differently: Take 10 mg by mouth daily.), Disp: 90 tablet, Rfl: 3   losartan (COZAAR) 25 MG tablet, Take 1 tablet (25 mg total) by mouth daily., Disp: 90 tablet, Rfl: 3   methocarbamol (ROBAXIN) 500 MG tablet, Take 1 tablet (500 mg total) by mouth every 6 (six) hours as needed for muscle spasms., Disp: 40 tablet, Rfl: 0   montelukast (SINGULAIR) 10 MG tablet, TAKE 1 TABLET BY MOUTH EVERYDAY AT BEDTIME, Disp: 90 tablet, Rfl: 3   Multiple Vitamins-Minerals (MULTIVITAMIN WITH MINERALS) tablet, Take 1 tablet by mouth daily., Disp: , Rfl:    oxyCODONE (OXY IR/ROXICODONE) 5 MG immediate release tablet, Take 1-2 tablets (5-10 mg total) by mouth every 4 (four) hours as needed for breakthrough pain (Breakthrough pain not responding to chronic 10 mg oxycodone 5x daily)., Disp: 30 tablet, Rfl: 0   Oxycodone HCl 10 MG TABS, Take 10 mg by  mouth 5 (five) times daily as needed (pain)., Disp: , Rfl:    promethazine (PHENERGAN) 12.5 MG tablet, Take 1 tablet (12.5 mg total) by mouth every 8 (eight) hours as needed for nausea or vomiting., Disp: 30 tablet, Rfl: 0   propranolol (INDERAL) 20 MG tablet, TAKE 1 TABLET BY MOUTH THREE TIMES A DAY (Patient taking differently: Take 20 mg by mouth 2 (two) times daily.), Disp: 270 tablet, Rfl: 0   rosuvastatin (CRESTOR) 40 MG tablet, TAKE 1 TABLET BY MOUTH EVERYDAY AT BEDTIME, Disp: 90 tablet, Rfl: 0   sodium chloride (OCEAN) 0.65 % SOLN nasal spray, Place 1 spray into both nostrils as needed for congestion., Disp: , Rfl:    SUMAtriptan (IMITREX) 25 MG tablet, Take 1 tablet (25 mg total) by mouth every 2 (two) hours as needed for migraine (ongoing headache). Maximum daily dose 200mg , Disp: 30 tablet, Rfl: 0   fluticasone (FLONASE) 50 MCG/ACT nasal spray, PLACE 2 SPRAYS INTO BOTH NOSTRILS EVERY EVENING. (Patient not  taking: Reported on 05/25/2022), Disp: 48 mL, Rfl: 1  Allergies  Allergen Reactions   Metformin And Related Diarrhea   Adhesive [Tape] Rash and Other (See Comments)    Regular tape is ok, allergy is to paper tape    I personally reviewed active problem list, medication list, allergies, notes from last encounter with the patient/caregiver today.  ROS  Constitutional: Negative for fever or weight change.  HEENT: positive for nasal congestion Respiratory: positive for cough and negative for  shortness of breath.   Cardiovascular: Negative for chest pain or palpitations.  Gastrointestinal: Negative for abdominal pain, no bowel changes.  Musculoskeletal: Negative for gait problem or joint swelling.  Skin: Negative for rash.  Neurological: positive for dizziness, negative headache.  No other specific complaints in a complete review of systems (except as listed in HPI above).   Objective  Virtual encounter, vitals not obtained.  There is no height or weight on file to calculate  BMI.  Nursing Note and Vital Signs reviewed.  Physical Exam  Awake, alert and oriented, speaking in complete sentences  No results found for this or any previous visit (from the past 72 hour(s)).  Assessment & Plan  1. Acute viral labyrinthitis, unspecified laterality Push fluids - methylPREDNISolone (MEDROL DOSEPAK) 4 MG TBPK tablet; Day 1: Take 8 mg (2 tablets) before breakfast, 4 mg (1 tablet) after lunch, 4 mg (1 tablet) after supper, and 8 mg (2 tablets) at bedtime. Day 2:Take 4 mg (1 tablet) before breakfast, 4 mg (1 tablet) after lunch, 4 mg (1 tablet) after supper, and 8 mg (2 tablets) at bedtime. Day 3: Take 4 mg (1 tablet) before breakfast, 4 mg (1 tablet) after lunch, 4 mg (1 tablet) after supper, and 4 mg (1 tablet) at bedtime. Day 4: Take 4 mg (1 tablet) before breakfast, 4 mg (1 tablet) after lunch, and 4 mg (1 tablet) at bedtime. Day 5: Take 4 mg (1 tablet) before breakfast and 4 mg (1 tablet) at bedtime. Day 6: Take 4 mg (1 tablet) before breakfast  Dispense: 21 tablet; Refill: 0  2. Viral upper respiratory tract infection Take mucinex, flonase, claritin Continue to [place] singulair, albuterol and symbicort Follow up with pulmonology - loratadine (CLARITIN) 10 MG tablet; Take 1 tablet (10 mg total) by mouth daily.  Dispense: 30 tablet; Refill: 3 - chlorpheniramine-HYDROcodone (TUSSIONEX) 10-8 MG/5ML; Take 5 mLs by mouth every 12 (twelve) hours as needed for cough.  Dispense: 115 mL; Refill: 0   -Red flags and when to present for emergency care or RTC including fever >101.68F, chest pain, shortness of breath, new/worsening/un-resolving symptoms,  reviewed with patient at time of visit. Follow up and care instructions discussed and provided in AVS. - I discussed the assessment and treatment plan with the patient. The patient was provided an opportunity to ask questions and all were answered. The patient agreed with the plan and demonstrated an understanding of the instructions.  I  provided 15 minutes of non-face-to-face time during this encounter.  [name], [name]

## 2022-07-19 DIAGNOSIS — Z5189 Encounter for other specified aftercare: Secondary | ICD-10-CM | POA: Diagnosis not present

## 2022-07-21 ENCOUNTER — Other Ambulatory Visit: Payer: Self-pay | Admitting: Nurse Practitioner

## 2022-07-21 ENCOUNTER — Telehealth: Payer: Self-pay | Admitting: Emergency Medicine

## 2022-07-21 DIAGNOSIS — R062 Wheezing: Secondary | ICD-10-CM

## 2022-07-21 MED ORDER — ALBUTEROL SULFATE (2.5 MG/3ML) 0.083% IN NEBU: 2.5000 mg | INHALATION_SOLUTION | Freq: Four times a day (QID) | RESPIRATORY_TRACT | 1 refills | Status: DC | PRN

## 2022-07-21 NOTE — Telephone Encounter (Signed)
Would like refill on albuterol solution for nebulizer. I see this on her history list but not current

## 2022-07-26 ENCOUNTER — Other Ambulatory Visit: Payer: Self-pay | Admitting: Nurse Practitioner

## 2022-07-26 DIAGNOSIS — G43019 Migraine without aura, intractable, without status migrainosus: Secondary | ICD-10-CM

## 2022-07-26 NOTE — Telephone Encounter (Signed)
Requested Prescriptions  Pending Prescriptions Disp Refills   SUMAtriptan (IMITREX) 25 MG tablet [Pharmacy Med Name: SUMATRIPTAN SUCC 25 MG TABLET] 30 tablet 0    Sig: TAKE 1 TAB EVERY 2 HOURS AS NEEDED FOR MIGRAINE (ONGOING HEADACHE). MAXIMUM DAILY DOSE 200MG      Neurology:  Migraine Therapy - Triptan Failed - 07/26/2022 12:57 AM      Failed - Last BP in normal range    BP Readings from Last 1 Encounters:  06/09/22 (!) 155/60         Passed - Valid encounter within last 12 months    Recent Outpatient Visits           2 weeks ago Acute viral labyrinthitis, unspecified laterality   [facility], [name]   1 month ago Acute cough   [facility], [name]   2 months ago Preoperative examination   [facility], [name]   3 months ago Pustular lesion   [facility], MD   3 months ago Shortness of breath   [facility], [name]

## 2022-08-08 ENCOUNTER — Telehealth: Payer: Self-pay | Admitting: Emergency Medicine

## 2022-08-08 NOTE — Telephone Encounter (Signed)
Patient notified

## 2022-08-08 NOTE — Telephone Encounter (Signed)
Has been taking [place] for a month. Dry mouth and frequent urination while taking it. IS this side effects of the medication

## 2022-08-17 DIAGNOSIS — G894 Chronic pain syndrome: Secondary | ICD-10-CM | POA: Diagnosis not present

## 2022-08-17 DIAGNOSIS — Z79891 Long term (current) use of opiate analgesic: Secondary | ICD-10-CM | POA: Diagnosis not present

## 2022-08-17 DIAGNOSIS — M5416 Radiculopathy, lumbar region: Secondary | ICD-10-CM | POA: Diagnosis not present

## 2022-08-17 DIAGNOSIS — M5459 Other low back pain: Secondary | ICD-10-CM | POA: Diagnosis not present

## 2022-08-24 ENCOUNTER — Ambulatory Visit: Payer: Medicare HMO

## 2022-08-24 DIAGNOSIS — E1129 Type 2 diabetes mellitus with other diabetic kidney complication: Secondary | ICD-10-CM

## 2022-08-24 DIAGNOSIS — I10 Essential (primary) hypertension: Secondary | ICD-10-CM

## 2022-08-24 NOTE — Progress Notes (Signed)
Care Management & Coordination Services Pharmacy Note  08/24/2022 Name:  Lori Potts MRN:  [id number] DOB:  02-04-1946  Summary: Patient presents for follow-up consult.   Few episodes of dizziness or vertigo. Reports 4 episodes over the past 3 months.   Recommendations/Changes made from today's visit: Continue current medications  Follow up plan: CPP follow-up 6 months    Subjective: Lori Potts is an 77 y.o. year old female who is a primary patient of Bo Merino, [name].  The care coordination team was consulted for assistance with disease management and care coordination needs.    Engaged with patient by telephone for follow up visit.  Patient Care Team: Bo Merino, [name] as PCP - General (Nurse Practitioner) Germaine Pomfret, [facility] (Pharmacist) Suella Broad, MD as Consulting Physician (Physical Medicine and Rehabilitation) Tyler Pita, MD as Consulting Physician (Pulmonary Disease) Lovell Sheehan, MD as Consulting Physician (Orthopedic Surgery)  Recent office visits: 06/15/22: Patient presented to Serafina Royals, [name] for acute cough.   Recent consult visits: 05/17/22: Patient presented to Dr. Clayborn Bigness (cardiology) for follow-up.   Hospital visits: None in previous 6 months   Objective:  Lab Results  Component Value Date   CREATININE 1.04 (H) 06/09/2022   BUN 21 06/09/2022   EGFR 59 (L) 04/07/2022   GFRNONAA 56 (L) 06/09/2022   GFRAA 101 02/18/2020   NA 138 06/09/2022   K 3.9 06/09/2022   CALCIUM 9.3 06/09/2022   CO2 22 06/09/2022   GLUCOSE 202 (H) 06/09/2022    Lab Results  Component Value Date/Time   HGBA1C 7.1 (H) 06/03/2022 10:30 AM   HGBA1C 7.2 (H) 04/07/2022 01:56 PM   HGBA1C 6.6 03/31/2019 12:00 AM   MICROALBUR 15.2 05/31/2022 10:54 AM   MICROALBUR 5.2 05/14/2019 12:00 AM   MICROALBUR 20 07/04/2016 10:36 AM    Last diabetic Eye exam:  Lab Results  Component Value Date/Time   HMDIABEYEEXA No Retinopathy 06/02/2020  12:00 AM    Last diabetic Foot exam: No results found for: "HMDIABFOOTEX"   Lab Results  Component Value Date   CHOL 168 04/07/2022   HDL 57 04/07/2022   LDLCALC 93 04/07/2022   TRIG 85 04/07/2022   CHOLHDL 2.9 04/07/2022       Latest Ref Rng & Units 04/07/2022    1:56 PM 07/08/2021    3:15 PM 11/25/2020    8:39 AM  Hepatic Function  Total Protein 6.1 - 8.1 g/dL 6.8  7.3  7.2   AST 10 - 35 U/L _0 ALT 6 - 29 U/L _1 Total Bilirubin 0.2 - 1.2 mg/dL 0.4  0.4  0.4     Lab Results  Component Value Date/Time   TSH 1.16 04/07/2022 01:56 PM   TSH 2.00 02/18/2020 09:37 AM   FREET4 1.1 02/18/2020 09:37 AM       Latest Ref Rng & Units 06/09/2022    3:34 AM 06/03/2022   10:30 AM 04/07/2022    1:56 PM  CBC  WBC 4.0 - 10.5 K/uL 13.7  5.3  4.6   Hemoglobin 12.0 - 15.0 g/dL 10.8  12.4  12.0   Hematocrit 36.0 - 46.0 % 33.6  39.0  36.3   Platelets 150 - 400 K/uL 307  426  422     Lab Results  Component Value Date/Time   VD25OH 24 (L) 05/09/2018 09:50 AM   VD25OH 25 (L) 09/11/2017 02:09 PM  [id number] 896 09/11/2017 02:09 PM   [id number] 127 05/18/2017 03:17 PM    Clinical ASCVD: No  The 10-year ASCVD risk score (Arnett DK, et al., 2019) is: 37.9%   Values used to calculate the score:     Age: 48 years     Sex: Female     Is Non-Hispanic African American: Yes     Diabetic: Yes     Tobacco smoker: No     Systolic Blood Pressure: 517 mmHg     Is BP treated: Yes     HDL Cholesterol: 57 mg/dL     Total Cholesterol: 168 mg/dL       07/11/2022    9:45 AM 06/15/2022    9:42 AM 05/23/2022   11:31 AM  Depression screen PHQ 2/9  Decreased Interest 0 0 0  Down, Depressed, Hopeless 0 0 0  PHQ - 2 Score 0 0 0  Altered sleeping  0   Tired, decreased energy  0   Change in appetite  0   Feeling bad or failure about yourself   0   Trouble concentrating  0   Moving slowly or fidgety/restless  0   Suicidal thoughts  0   PHQ-9 Score  0   Difficult doing  work/chores  Not difficult at all     Social History   Tobacco Use  Smoking Status Former   Types: Cigarettes   Quit date: 2006   Years since quitting: 18.0  Smokeless Tobacco Never  Tobacco Comments   quit smoking 25yr ago   BP Readings from Last 3 Encounters:  06/09/22 (!) 155/60  06/03/22 (!) 154/72  05/23/22 124/82   Pulse Readings from Last 3 Encounters:  06/09/22 74  06/03/22 64  05/23/22 78   Wt Readings from Last 3 Encounters:  06/08/22 186 lb (84.4 kg)  06/03/22 186 lb (84.4 kg)  05/23/22 187 lb 11.2 oz (85.1 kg)   BMI Readings from Last 3 Encounters:  06/08/22 32.43 kg/m  06/03/22 32.43 kg/m  05/23/22 32.22 kg/m    Allergies  Allergen Reactions   Metformin And Related Diarrhea   Adhesive [Tape] Rash and Other (See Comments)    Regular tape is ok, allergy is to paper tape    Medications Reviewed Today     Reviewed by JHollie Salk RRoachdale(Registered Medical Assistant) on 07/11/22 at 0SolonList Status: <None>   Medication Order Taking? Sig Documenting Provider Last Dose Status Informant  albuterol (VENTOLIN HFA) 108 (90 Base) MCG/ACT inhaler [id number] Inhale 2 puffs into the lungs every 6 (six) hours as needed for wheezing or shortness of breath. PBo Merino [name] Taking Active Self  azelastine (ASTELIN) 0.1 % nasal spray [id number] PLACE 1 SPRAY INTO BOTH NOSTRILS 2 (TWO) TIMES DAILY. USE IN EACH NOSTRIL AS DIRECTED SSteele Sizer MD Taking Active Self  budesonide-formoterol ([employer] 160-4.5 MCG/ACT inhaler [id number] Inhale 2 puffs into the lungs 2 (two) times daily. Patient receives via [employer] Patient Assistance. PBo Merino [name] Taking Active Self  busPIRone (BUSPAR) 5 MG tablet [id number] TAKE 1 TABLET BY MOUTH 3 TIMES DAILY AS NEEDED. PBo Merino [name] Taking Active Self  citalopram (CELEXA) 40 MG tablet [id number] Take 1 tablet (40 mg total) by mouth daily. PBo Merino [name] Taking Active Self  dapagliflozin  propanediol (FARXIGA) 5 MG TABS tablet [id number] Take 1 tablet (5 mg total) by mouth daily before breakfast. PBo Merino [name] Taking Active   docusate  sodium (COLACE) 100 MG capsule [id number] Yes Take 100 mg by mouth 2 (two) times daily. [provider] Taking Active Self  Dulaglutide (TRULICITY) 6.83 MH/9.6QI SOPN [id number] Yes Inject 0.75 mg into the skin once a week. Patient receives via [employer] Patient Assistance through Dec 2023  Patient taking differently: Inject 0.75 mg into the skin every Thursday. Patient receives via [employer] Patient Assistance through Dec 2023   Bo Merino, [name] Taking Active Self  esomeprazole (NEXIUM) 40 MG capsule [id number] Yes Take 40 mg by mouth at bedtime. [provider] Taking Active Self  fluticasone (FLONASE) 50 MCG/ACT nasal spray [id number] No PLACE 2 SPRAYS INTO BOTH NOSTRILS EVERY EVENING.  Patient not taking: Reported on 05/25/2022   Steele Sizer, MD Not Taking Active Self  loratadine (CLARITIN) 10 MG tablet [id number] Yes TAKE 1 TABLET BY MOUTH TWICE A DAY  Patient taking differently: Take 10 mg by mouth daily.   Bo Merino, [name] Taking Active Self  losartan (COZAAR) 25 MG tablet [id number] Yes Take 1 tablet (25 mg total) by mouth daily. Bo Merino, [name] Taking Active Self  methocarbamol (ROBAXIN) 500 MG tablet [id number] Yes Take 1 tablet (500 mg total) by mouth every 6 (six) hours as needed for muscle spasms. Edmisten, Ok Anis, [place] Taking Active   montelukast (SINGULAIR) 10 MG tablet [id number] Yes TAKE 1 TABLET BY MOUTH EVERYDAY AT BEDTIME Bo Merino, [name] Taking Active Self  Multiple Vitamins-Minerals (MULTIVITAMIN WITH MINERALS) tablet [id number] Yes Take 1 tablet by mouth daily. [provider] Taking Active Self  oxyCODONE (OXY IR/ROXICODONE) 5 MG immediate release tablet [id number] Yes Take 1-2 tablets (5-10 mg total) by mouth every 4 (four) hours as needed for breakthrough pain (Breakthrough  pain not responding to chronic 10 mg oxycodone 5x daily). Edmisten, Ok Anis, [place] Taking Active   Oxycodone HCl 10 MG TABS [id number] Yes Take 10 mg by mouth 5 (five) times daily as needed (pain). [provider] Taking Active Self  promethazine (PHENERGAN) 12.5 MG tablet [id number] Yes Take 1 tablet (12.5 mg total) by mouth every 8 (eight) hours as needed for nausea or vomiting. Bo Merino, [name] Taking Active Self  propranolol (INDERAL) 20 MG tablet [id number] Yes TAKE 1 TABLET BY MOUTH THREE TIMES A DAY  Patient taking differently: Take 20 mg by mouth 2 (two) times daily.   Bo Merino, [name] Taking Active Self  rosuvastatin (CRESTOR) 40 MG tablet [id number] Yes TAKE 1 TABLET BY MOUTH EVERYDAY AT BEDTIME Bo Merino, [name] Taking Active   sodium chloride (OCEAN) 0.65 % SOLN nasal spray [id number] Yes Place 1 spray into both nostrils as needed for congestion. [provider] Taking Active Self  SUMAtriptan (IMITREX) 25 MG tablet [id number] Yes Take 1 tablet (25 mg total) by mouth every 2 (two) hours as needed for migraine (ongoing headache). Maximum daily dose 269m PBo Merino [name] Taking Active Self            Patient Active Problem List   Diagnosis Date Noted   OA (osteoarthritis) of hip 06/08/2022   Primary osteoarthritis of left hip 06/08/2022   Mild intermittent asthma without complication 126/33/3545  Anxiety 04/07/2022   Shortness of breath 04/07/2022   Palpitations 04/07/2022   Mild episode of recurrent major depressive disorder ([place] 04/07/2022   Moderate persistent asthma without complication 062/56/3893  Neutropenia, unspecified type ([place] 11/25/2020   Intractable migraine with aura without status migrainosus 04/18/2020   Body mass index (BMI)  31.0-31.9, adult 02/06/2020   Chronic left hip pain 12/26/2019   Anemia 09/22/2017   S/P knee replacement 07/28/2017   Allergic rhinitis 05/26/2017   Vitamin D deficiency 05/19/2017   Chronic nonintractable  headache 05/18/2017   Advanced care planning/counseling discussion    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[place]) 03/02/2017   Insomnia 11/29/2016   Chronic right-sided low back pain with right-sided sciatica 04/19/2016   Abdominal wall pain in right flank 03/29/2016   Diverticulosis 02/24/2016   Type 2 diabetes mellitus with microalbuminuria, without long-term current use of insulin ([place]) 06/16/2015   Hyperlipidemia associated with type 2 diabetes mellitus ([place]) 06/16/2015   Chronic pain 04/13/2015   Neck pain 02/26/2015   Status post lumbar surgery 02/26/2015   Abnormal ECG 01/27/2015   Angina pectoris ([place]) 01/27/2015   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[place]) 01/27/2015   Essential (primary) hypertension 01/27/2015    Immunization History  Administered Date(s) Administered   Fluad Quad(high Dose 65+) 05/08/2019, 06/07/2021, 05/15/2022   Influenza, High Dose Seasonal PF 06/10/2015, 06/20/2016, 05/09/2017, 05/09/2018, 04/17/2020   PFIZER(Purple Top)SARS-COV-2 Vaccination 09/11/2019, 10/02/2019, 06/08/2020, 12/29/2020, 06/23/2022   PNEUMOCOCCAL CONJUGATE-20 06/07/2021   Pfizer Covid-19 Vaccine Bivalent Booster 26yr & up 07/14/2021, 06/23/2022   Pneumococcal Conjugate-13 11/28/2013   Pneumococcal Polysaccharide-23 03/16/2018   Tdap 05/08/2019     Compliance/Adherence/Medication fill history: Care Gaps: Shingrix Vaccine Opthalmology exam  COVID-19 Vaccine  Star-Rating Drugs: Rosuvastatin 40 mg last filled [facility]06/2022 [facility] Losartan 25 mg last filled 03/14/2022 [facility] day [facility]  SDOH:  (Social Determinants of Health) assessments and interventions performed: Yes SDOH Interventions    Flowsheet Row Chronic Care Management from 11/24/2021 in CChanningfrom 11/18/2021 in CKennardManagement from 05/26/2021 in [place] from 03/18/2021 in [facility] [facility] SDOH Interventions       Food Insecurity Interventions -- Intervention Not Indicated -- -- --  Housing Interventions -- Intervention Not Indicated -- -- --  Transportation Interventions -- Intervention Not Indicated -- -- Intervention Not Indicated  Financial Strain Interventions Other (Comment)  [PAPs] Intervention Not Indicated Intervention Not Indicated Intervention Not Indicated Other (Comment)  [Will start PAP]  Physical Activity Interventions -- Intervention Not Indicated -- -- --  Stress Interventions -- Intervention Not Indicated -- -- --  Social Connections Interventions -- Intervention Not Indicated -- -- --      SDOH Screenings   Food Insecurity: No Food Insecurity (06/08/2022)  Housing: Low Risk  (06/08/2022)  Transportation Needs: No Transportation Needs (06/08/2022)  Utilities: Not At Risk (06/08/2022)  Alcohol Screen: Low Risk  (07/11/2022)  Depression (PHQ2-9): Low Risk  (07/11/2022)  Financial Resource Strain: Medium Risk (11/24/2021)  Physical Activity: Inactive (11/18/2021)  Social Connections: Socially Integrated (11/18/2021)  Stress: No Stress Concern Present (11/18/2021)  Tobacco Use: Medium Risk (07/11/2022)    Medication Assistance: Application for Trulicity  medication assistance program. in process.  Anticipated assistance start date TBD.  See plan of care for additional detail.  Medication Access: Within the past 30 days, how often has patient missed a dose of medication? None in recall Is a pillbox or other method used to improve adherence? Yes  Factors that may affect medication adherence? nonadherence to medications Are meds synced by current pharmacy? No  Are meds delivered by current pharmacy? Yes  Does patient experience delays  in picking up medications due to transportation concerns? No   [facility] Services  Reviewed: Is patient disadvantaged to use [facility]?: Yes  Current Rx insurance plan: [employer]  Name and location of [facility] pharmacy[facility]  [id number] [place] [place] [address]Phone: [phone]: [phone] [address]Phone: [phone]: [phone] [facility] ([place] [address] [place] [address]Phone: [phone]: [phone] [id number] [place]: [phone]: [address]one: [phone]: [phone] services reviewed with patient today?: No  Patient requests to transfer care to [facility]?: No  Reason patient declined to change pharmacies: Disadvantaged due to insurance/mail order  Assessment/Plan  Hypertension (BP goal <140/90) -Controlled -Current treatment: Losartan 25 mg daily  Propanolol 20 mg three times daily  -Medications previously tried: NA  -Current home readings: 132/60, 128/70  -Denies hypotensive/hypertensive symptoms -Continue current medications   Hyperlipidemia: (LDL goal < 100) -Uncontrolled -Current treatment: Rosuvastatin 40 mg daily  -Medications previously tried: NA  -Continue current medications  Diabetes (A1c goal <8%) -Controlled -Current medications: Trulicity 0.6.37g weekly: Appropriate, Effective, Safe, Accessible -Medications previously tried: NA  -Current home glucose readings fasting glucose: has not been able to monitor due to not having necessary test strips or lancets. Patient unsure what glucometer she uses  -Reports hypoglycemic symptoms: NA  -Continue current medications  Asthma with  Allergic Rhinitis (Goal: control symptoms and prevent exacerbations) -Controlled -Current treatment  Albuterol HFA 1 puff every 6 hours as needed   Montelukast 10 mg nightly  Symbicort 160-4.5 mcg/act 2 puffs twice daily  -Current allergy treatment  Azelastine nasal spray twice daily  Xyzal 5 mg nightly  [place] nasal spray  -Medications previously tried: BrFirefighterineffective), Trelegy (cost)  -Pulmonary function testing: FEV1 49%, 65% Post-BD. FEV1/FVC 76% (May 2023)  -Exacerbations requiring treatment in last 6 months: No -Continue current medications  Chronic Migraines (Goal: Reduce frequency and severity of migraines) -Controlled -Current treatment  Propranolol 20 mg three times daily Sumatriptan 25 mg as needed   -Medications previously tried: EmTeaching laboratory technicianCost)  -Continue current medications  AlMalva LimesCPCraneharmacist Practitioner  [facility]Av[phone]

## 2022-09-01 DIAGNOSIS — M5416 Radiculopathy, lumbar region: Secondary | ICD-10-CM | POA: Diagnosis not present

## 2022-09-05 ENCOUNTER — Other Ambulatory Visit: Payer: Self-pay | Admitting: Emergency Medicine

## 2022-09-05 DIAGNOSIS — J45909 Unspecified asthma, uncomplicated: Secondary | ICD-10-CM

## 2022-09-06 MED ORDER — ALBUTEROL SULFATE HFA 108 (90 BASE) MCG/ACT IN AERS: 2.0000 | INHALATION_SPRAY | Freq: Four times a day (QID) | RESPIRATORY_TRACT | 2 refills | Status: DC | PRN

## 2022-09-06 MED ORDER — BUDESONIDE-FORMOTEROL FUMARATE 160-4.5 MCG/ACT IN AERO: 2.0000 | INHALATION_SPRAY | Freq: Two times a day (BID) | RESPIRATORY_TRACT | Status: DC

## 2022-09-14 NOTE — Progress Notes (Unsigned)
There were no vitals taken for this visit.   Subjective:    Patient ID: [name], female    DOB: 02-16-1946, 77 y.o.   MRN: [id number]  HPI: KITA NEACE is a 77 y.o. female, with husband  No chief complaint on file.  Diabetes: Currently taking Trulicity 0.96 mg weekly.  Her last A1c was 1.2 on 04/07/2022.  She says her blood sugars have been running around ***.  She denies any polydipsia, polyuria aphagia or polyuria.  HTN; currently taking propranolol 20 mg 3 times a day and losartan 25 mg daily.  Her blood pressure today is ***.  She denies any chest pain, shortness of breath, or blurred vision.  Hyperlipidemia: Her last LDL was 93 on 04/07/2022.  She currently takes rosuvastatin 40 mg daily.  She denies any myalgia.  The 10-year ASCVD risk score (Arnett DK, et al., 2019) is: 31.5%   Values used to calculate the score:     Age: 58 years     Sex: Female     Is Non-Hispanic African American: Yes     Diabetic: Yes     Tobacco smoker: No     Systolic Blood Pressure: 283 mmHg     Is BP treated: Yes     HDL Cholesterol: 57 mg/dL     Total Cholesterol: 168 mg/dL   Asthma: Patient currently takes Singulair 10 mg daily, Claritin, Flonase and Symbicort 2 times a day.  She reports her breathing has been ***.   Migraines: Patient has a history of right-sided migraines for many years.  She has tried multiple preventative and abortive therapies.  She is currently taking propranolol 20 mg 3 times daily and Imitrex 25 when she has a migraine. Patient reports ***migraines per month.  Anxiety: Currently takes Celexa 40 mg daily.  Patient reports her mental health has been doing well.     06/15/2022    9:43 AM 05/23/2022   11:34 AM 04/07/2022   12:54 PM 09/13/2021   11:11 AM  GAD 7 : Generalized Anxiety Score  Nervous, Anxious, on Edge 0 0 3 0  Control/stop worrying 0 0 3 0  Worry too much - different things 0 0 3 0  Trouble relaxing 0 0 3 0  Restless 0 0 3 0  Easily annoyed  or irritable 0 0 3 0  Afraid - awful might happen 0 0 3 0  Total GAD 7 Score 0 0 21 0  Anxiety Difficulty Not difficult at all Not difficult at all Very difficult Not difficult at all     Relevant past medical, surgical, family and social history reviewed and updated as indicated. Interim medical history since our last visit reviewed. Allergies and medications reviewed and updated.  Review of Systems  Constitutional: Negative for fever or weight change.  Respiratory: Negative for cough and shortness of breath.   Cardiovascular: Negative for chest pain or palpitations.  Gastrointestinal: Negative for abdominal pain, no bowel changes.  Musculoskeletal: Negative for gait problem or joint swelling.  Skin: Negative for rash.  Neurological: Negative for dizziness, positive for headache.  No other specific complaints in a complete review of systems (except as listed in HPI above).      Objective:    There were no vitals taken for this visit.  Wt Readings from Last 3 Encounters:  06/08/22 186 lb (84.4 kg)  06/03/22 186 lb (84.4 kg)  05/23/22 187 lb 11.2 oz (85.1 kg)    Physical Exam  Constitutional:  Patient appears well-developed and well-nourished. No distress.  HEENT: head atraumatic, normocephalic, pupils equal and reactive to light, neck supple Cardiovascular: Normal rate, regular rhythm and normal heart sounds.  No murmur heard. No BLE edema. Pulmonary/Chest: Effort normal and breath sounds normal. No respiratory distress. Abdominal: Soft.  There is no tenderness. Psychiatric: Patient has a normal mood and affect. behavior is normal. Judgment and thought content normal.   Results for orders placed or performed during the hospital encounter of 06/08/22  Glucose, capillary  Result Value Ref Range   Glucose-Capillary 164 (H) 70 - 99 mg/dL  Glucose, capillary  Result Value Ref Range   Glucose-Capillary 140 (H) 70 - 99 mg/dL  Glucose, capillary  Result Value Ref Range    Glucose-Capillary 311 (H) 70 - 99 mg/dL  CBC  Result Value Ref Range   WBC 13.7 (H) 4.0 - 10.5 K/uL   RBC 4.07 3.87 - 5.11 MIL/uL   Hemoglobin 10.8 (L) 12.0 - 15.0 g/dL   HCT 33.6 (L) 36.0 - 46.0 %   MCV 82.6 80.0 - 100.0 fL   MCH 26.5 26.0 - 34.0 pg   MCHC 32.1 30.0 - 36.0 g/dL   RDW 14.4 11.5 - 15.5 %   Platelets 307 150 - 400 K/uL   nRBC 0.0 0.0 - 0.2 %  Basic metabolic panel  Result Value Ref Range   Sodium 138 135 - 145 mmol/L   Potassium 3.9 3.5 - 5.1 mmol/L   Chloride 107 98 - 111 mmol/L   CO2 22 22 - 32 mmol/L   Glucose, Bld 202 (H) 70 - 99 mg/dL   BUN 21 8 - 23 mg/dL   Creatinine, Ser 1.04 (H) 0.44 - 1.00 mg/dL   Calcium 9.3 8.9 - 10.3 mg/dL   GFR, Estimated 56 (L) >60 mL/min   Anion gap 9 5 - 15  Glucose, capillary  Result Value Ref Range   Glucose-Capillary 190 (H) 70 - 99 mg/dL  Glucose, capillary  Result Value Ref Range   Glucose-Capillary 163 (H) 70 - 99 mg/dL      Assessment & Plan:   Problem List Items Addressed This Visit   None    Follow up plan: No follow-ups on file.

## 2022-09-15 ENCOUNTER — Other Ambulatory Visit: Payer: Self-pay

## 2022-09-15 ENCOUNTER — Encounter: Payer: Self-pay | Admitting: Nurse Practitioner

## 2022-09-15 ENCOUNTER — Ambulatory Visit (INDEPENDENT_AMBULATORY_CARE_PROVIDER_SITE_OTHER): Payer: Medicare Other | Admitting: Nurse Practitioner

## 2022-09-15 VITALS — BP 128/76 | HR 92 | Temp 97.9°F | Resp 16 | Ht 64.0 in | Wt 177.6 lb

## 2022-09-15 DIAGNOSIS — F330 Major depressive disorder, recurrent, mild: Secondary | ICD-10-CM

## 2022-09-15 DIAGNOSIS — I10 Essential (primary) hypertension: Secondary | ICD-10-CM

## 2022-09-15 DIAGNOSIS — R809 Proteinuria, unspecified: Secondary | ICD-10-CM | POA: Diagnosis not present

## 2022-09-15 DIAGNOSIS — E1169 Type 2 diabetes mellitus with other specified complication: Secondary | ICD-10-CM

## 2022-09-15 DIAGNOSIS — E7800 Pure hypercholesterolemia, unspecified: Secondary | ICD-10-CM | POA: Diagnosis not present

## 2022-09-15 DIAGNOSIS — N1831 Chronic kidney disease, stage 3a: Secondary | ICD-10-CM | POA: Diagnosis not present

## 2022-09-15 DIAGNOSIS — G43119 Migraine with aura, intractable, without status migrainosus: Secondary | ICD-10-CM | POA: Diagnosis not present

## 2022-09-15 DIAGNOSIS — J4540 Moderate persistent asthma, uncomplicated: Secondary | ICD-10-CM

## 2022-09-15 DIAGNOSIS — E1129 Type 2 diabetes mellitus with other diabetic kidney complication: Secondary | ICD-10-CM

## 2022-09-15 DIAGNOSIS — I700 Atherosclerosis of aorta: Secondary | ICD-10-CM | POA: Diagnosis not present

## 2022-09-15 DIAGNOSIS — E785 Hyperlipidemia, unspecified: Secondary | ICD-10-CM | POA: Diagnosis not present

## 2022-09-15 DIAGNOSIS — J309 Allergic rhinitis, unspecified: Secondary | ICD-10-CM

## 2022-09-15 DIAGNOSIS — F419 Anxiety disorder, unspecified: Secondary | ICD-10-CM

## 2022-09-15 MED ORDER — PROPRANOLOL HCL 20 MG PO TABS: 20.0000 mg | ORAL_TABLET | Freq: Three times a day (TID) | ORAL | 1 refills | Status: DC

## 2022-09-15 MED ORDER — ROSUVASTATIN CALCIUM 40 MG PO TABS: 40.0000 mg | ORAL_TABLET | Freq: Every day | ORAL | 1 refills | Status: DC

## 2022-09-15 MED ORDER — BUDESONIDE-FORMOTEROL FUMARATE 160-4.5 MCG/ACT IN AERO: 2.0000 | INHALATION_SPRAY | Freq: Two times a day (BID) | RESPIRATORY_TRACT | 5 refills | Status: DC

## 2022-09-15 MED ORDER — CITALOPRAM HYDROBROMIDE 40 MG PO TABS: 40.0000 mg | ORAL_TABLET | Freq: Every day | ORAL | 1 refills | Status: DC

## 2022-09-15 MED ORDER — LOSARTAN POTASSIUM 25 MG PO TABS: 25.0000 mg | ORAL_TABLET | Freq: Every day | ORAL | 1 refills | Status: DC

## 2022-09-15 NOTE — Assessment & Plan Note (Signed)
Continue taking Singulair, Claritin, and Symbicort.

## 2022-09-15 NOTE — Assessment & Plan Note (Signed)
Continue taking propanolol and Imitrex.  Referral placed to neurology.

## 2022-09-15 NOTE — Assessment & Plan Note (Signed)
Continue taking propranolol 20 mg three times a day and losartan 25 mg daily.

## 2022-09-15 NOTE — Assessment & Plan Note (Signed)
Continue taking trulicity 6.56 mg weekly and farxiga 5 mg daily.

## 2022-09-15 NOTE — Assessment & Plan Note (Signed)
Continue taking celexa 40 mg daily

## 2022-09-15 NOTE — Assessment & Plan Note (Signed)
Continue taking rosuvastatin 40 mg daily.   

## 2022-09-15 NOTE — Assessment & Plan Note (Signed)
Continue taking celexa 40 mg daily 

## 2022-09-15 NOTE — Assessment & Plan Note (Signed)
Continue to avoid nsaids

## 2022-09-16 LAB — COMPLETE METABOLIC PANEL WITH GFR
AG Ratio: 1.5 (calc) (ref 1.0–2.5)
ALT: 12 U/L (ref 6–29)
AST: 15 U/L (ref 10–35)
Albumin: 4.1 g/dL (ref 3.6–5.1)
Alkaline phosphatase (APISO): 73 U/L (ref 37–153)
BUN/Creatinine Ratio: 13 (calc) (ref 6–22)
BUN: 16 mg/dL (ref 7–25)
CO2: 27 mmol/L (ref 20–32)
Calcium: 10.2 mg/dL (ref 8.6–10.4)
Chloride: 107 mmol/L (ref 98–110)
Creat: 1.2 mg/dL — ABNORMAL HIGH (ref 0.60–1.00)
Globulin: 2.7 g/dL (calc) (ref 1.9–3.7)
Glucose, Bld: 115 mg/dL — ABNORMAL HIGH (ref 65–99)
Potassium: 4.1 mmol/L (ref 3.5–5.3)
Sodium: 142 mmol/L (ref 135–146)
Total Bilirubin: 0.3 mg/dL (ref 0.2–1.2)
Total Protein: 6.8 g/dL (ref 6.1–8.1)
eGFR: 47 mL/min/{1.73_m2} — ABNORMAL LOW (ref >=60)

## 2022-09-16 LAB — CBC WITH DIFFERENTIAL/PLATELET
Absolute Monocytes: 355 cells/uL (ref 200–950)
Basophils Absolute: 53 cells/uL (ref 0–200)
Basophils Relative: 1 %
Eosinophils Absolute: 254 cells/uL (ref 15–500)
Eosinophils Relative: 4.8 %
HCT: 37.3 % (ref 35.0–45.0)
Hemoglobin: 11.6 g/dL — ABNORMAL LOW (ref 11.7–15.5)
Lymphs Abs: 1272 cells/uL (ref 850–3900)
MCH: 24.5 pg — ABNORMAL LOW (ref 27.0–33.0)
MCHC: 31.1 g/dL — ABNORMAL LOW (ref 32.0–36.0)
MCV: 78.9 fL — ABNORMAL LOW (ref 80.0–100.0)
MPV: 10.6 fL (ref 7.5–12.5)
Monocytes Relative: 6.7 %
Neutro Abs: 3366 cells/uL (ref 1500–7800)
Neutrophils Relative %: 63.5 %
Platelets: 487 10*3/uL — ABNORMAL HIGH (ref 140–400)
RBC: 4.73 10*6/uL (ref 3.80–5.10)
RDW: 15 % (ref 11.0–15.0)
Total Lymphocyte: 24 %
WBC: 5.3 10*3/uL (ref 3.8–10.8)

## 2022-09-16 LAB — LIPID PANEL
Cholesterol: 156 mg/dL (ref <200)
HDL: 58 mg/dL (ref >=50)
LDL Cholesterol (Calc): 81 mg/dL (calc)
Non-HDL Cholesterol (Calc): 98 mg/dL (calc) (ref <130)
Total CHOL/HDL Ratio: 2.7 (calc) (ref <5.0)
Triglycerides: 91 mg/dL (ref <150)

## 2022-09-16 LAB — HEMOGLOBIN A1C
Hgb A1c MFr Bld: 7.2 % of total Hgb — ABNORMAL HIGH (ref <5.7)
Mean Plasma Glucose: 160 mg/dL
eAG (mmol/L): 8.9 mmol/L

## 2022-09-20 ENCOUNTER — Telehealth (INDEPENDENT_AMBULATORY_CARE_PROVIDER_SITE_OTHER): Payer: Medicare Other | Admitting: Nurse Practitioner

## 2022-09-20 ENCOUNTER — Encounter: Payer: Self-pay | Admitting: Nurse Practitioner

## 2022-09-20 VITALS — Ht 64.0 in | Wt 177.0 lb

## 2022-09-20 DIAGNOSIS — R42 Dizziness and giddiness: Secondary | ICD-10-CM

## 2022-09-20 DIAGNOSIS — G43119 Migraine with aura, intractable, without status migrainosus: Secondary | ICD-10-CM

## 2022-09-20 MED ORDER — UBRELVY 50 MG PO TABS: 50.0000 mg | ORAL_TABLET | ORAL | 0 refills | Status: DC | PRN

## 2022-09-20 NOTE — Progress Notes (Signed)
Name: Lori Potts   MRN: [id number]    DOB: 08-29-1945   Date:09/20/2022       Progress Note  Subjective  Chief Complaint  Chief Complaint  Patient presents with   Dizziness    I connected with  [name]  on 09/20/22 at  1:40 PM EST by a video enabled telemedicine application and verified that I am speaking with the correct person using two identifiers.  I discussed the limitations of evaluation and management by telemedicine and the availability of in person appointments. The patient expressed understanding and agreed to proceed with a virtual visit  Staff also discussed with the patient that there may be a patient responsible charge related to this service. Patient Location: home Provider Location: [facility] Additional Individuals present: alone  HPI  Migraine/Dizziness: patient reports she has had a migraine for two days.  She says she has taken the imitrex but it has not helped.  She also reports she has had a couple episodes of dizziness. Patient denies any recent illness.  Her blood sugar has been running around 132.  Her blood pressure was 162/78.  She says she had a couple episodes of dizziness.  One was last night and then it went away and then she had another this morning.  She says the tv was spinning. She says she had one episode of vomiting but after that the dizziness went away.  Patient denies any other neurological symptoms. No weakness, changes in mentation, facial droop, slurred speech or changes in gait.  Patient does have an appointment scheduled with neurology on 10/11/2022.  Will trial ubrelvy for her migraine and see if that helps.  Discussed that if she has any worsening symptoms she will need to go to the emergency room.   Patient Active Problem List   Diagnosis Date Noted   Stage 3a chronic kidney disease ([place]) 09/15/2022   OA (osteoarthritis) of hip 06/08/2022   Primary osteoarthritis of left hip 06/08/2022   Anxiety 04/07/2022   Palpitations 04/07/2022    Mild episode of recurrent major depressive disorder ([place]) 04/07/2022   Moderate persistent asthma without complication 93/79/0240   Intractable migraine with aura without status migrainosus 04/18/2020   Body mass index (BMI) 31.0-31.9, adult 02/06/2020   Chronic left hip pain 12/26/2019   Anemia 09/22/2017   S/P knee replacement 07/28/2017   Allergic rhinitis 05/26/2017   Vitamin D deficiency 05/19/2017   Chronic nonintractable headache 05/18/2017   Advanced care planning/counseling discussion    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[place]) 03/02/2017   Insomnia 11/29/2016   Abdominal wall pain in right flank 03/29/2016   Diverticulosis 02/24/2016   Type 2 diabetes mellitus with microalbuminuria, without long-term current use of insulin ([place]) 06/16/2015   Hyperlipidemia associated with type 2 diabetes mellitus ([place]) 06/16/2015   Chronic pain 04/13/2015   Neck pain 02/26/2015   Status post lumbar surgery 02/26/2015   Abnormal ECG 01/27/2015   Essential (primary) hypertension 01/27/2015    Social History   Tobacco Use   Smoking status: Former    Types: Cigarettes    Quit date: 2006    Years since quitting: 18.0   Smokeless tobacco: Never   Tobacco comments:    quit smoking 66yrs ago  Substance Use Topics   Alcohol use: No     Current Outpatient Medications:    Accu-Chek Softclix Lancets lancets, daily., Disp: , Rfl:    albuterol (PROVENTIL) (2.5 MG/3ML) 0.083% nebulizer solution, Take 3 mLs (2.5 mg total) by nebulization  every 6 (six) hours as needed for wheezing or shortness of breath., Disp: 150 mL, Rfl: 1   albuterol (VENTOLIN HFA) 108 (90 Base) MCG/ACT inhaler, Inhale 2 puffs into the lungs every 6 (six) hours as needed for wheezing or shortness of breath., Disp: 18 g, Rfl: 2   azelastine (ASTELIN) 0.1 % nasal spray, PLACE 1 SPRAY INTO BOTH NOSTRILS 2 (TWO) TIMES DAILY. USE IN EACH NOSTRIL AS DIRECTED, Disp: 90 mL, Rfl: 1   budesonide-formoterol (SYMBICORT) 160-4.5 MCG/ACT inhaler,  Inhale 2 puffs into the lungs 2 (two) times daily. Patient receives via [employer] Patient Assistance., Disp: 1 each, Rfl: 5   busPIRone (BUSPAR) 5 MG tablet, TAKE 1 TABLET BY MOUTH 3 TIMES DAILY AS NEEDED., Disp: 270 tablet, Rfl: 1   citalopram (CELEXA) 40 MG tablet, Take 1 tablet (40 mg total) by mouth daily., Disp: 90 tablet, Rfl: 1   dapagliflozin propanediol (FARXIGA) 5 MG TABS tablet, Take 1 tablet (5 mg total) by mouth daily before breakfast., Disp: 90 tablet, Rfl: 3   docusate sodium (COLACE) 100 MG capsule, Take 100 mg by mouth 2 (two) times daily., Disp: , Rfl:    Dulaglutide (TRULICITY) 2.29 NL/8.9QJ SOPN, Inject 0.75 mg into the skin once a week. Patient receives via [employer] Patient Assistance through Dec 2023 (Patient taking differently: Inject 0.75 mg into the skin every Thursday. Patient receives via [employer] Patient Assistance through Dec 2023), Disp: , Rfl:    esomeprazole (NEXIUM) 40 MG capsule, Take 40 mg by mouth at bedtime., Disp: , Rfl:    loratadine (CLARITIN) 10 MG tablet, Take 1 tablet (10 mg total) by mouth daily., Disp: 30 tablet, Rfl: 3   losartan (COZAAR) 25 MG tablet, Take 1 tablet (25 mg total) by mouth daily., Disp: 90 tablet, Rfl: 1   methocarbamol (ROBAXIN) 500 MG tablet, Take 1 tablet (500 mg total) by mouth every 6 (six) hours as needed for muscle spasms., Disp: 40 tablet, Rfl: 0   montelukast (SINGULAIR) 10 MG tablet, TAKE 1 TABLET BY MOUTH EVERYDAY AT BEDTIME, Disp: 90 tablet, Rfl: 3   Multiple Vitamins-Minerals (MULTIVITAMIN WITH MINERALS) tablet, Take 1 tablet by mouth daily., Disp: , Rfl:    Oxycodone HCl 10 MG TABS, Take 1 tablet 5 times a day by oral route as needed., Disp: , Rfl:    promethazine (PHENERGAN) 12.5 MG tablet, Take 1 tablet (12.5 mg total) by mouth every 8 (eight) hours as needed for nausea or vomiting., Disp: 30 tablet, Rfl: 0   propranolol (INDERAL) 20 MG tablet, Take 1 tablet (20 mg total) by mouth 3 (three) times daily., Disp: 270 tablet,  Rfl: 1   rosuvastatin (CRESTOR) 40 MG tablet, Take 1 tablet (40 mg total) by mouth daily., Disp: 90 tablet, Rfl: 1   sodium chloride (OCEAN) 0.65 % SOLN nasal spray, Place 1 spray into both nostrils as needed for congestion., Disp: , Rfl:    SUMAtriptan (IMITREX) 25 MG tablet, TAKE 1 TAB EVERY 2 HOURS AS NEEDED FOR MIGRAINE (ONGOING HEADACHE). MAXIMUM DAILY DOSE 200MG , Disp: 30 tablet, Rfl: 0  Allergies  Allergen Reactions   Metformin And Related Diarrhea   Adhesive [Tape] Rash and Other (See Comments)    Regular tape is ok, allergy is to paper tape    I personally reviewed active problem list, medication list, allergies, notes from last encounter with the patient/caregiver today.  ROS  Constitutional: Negative for fever or weight change.  Respiratory: Negative for cough and shortness of breath.   Cardiovascular:  Negative for chest pain or palpitations.  Gastrointestinal: Negative for abdominal pain, no bowel changes.  Musculoskeletal: Negative for gait problem or joint swelling.  Skin: Negative for rash.  Neurological: positive for dizziness and  headache.  No other specific complaints in a complete review of systems (except as listed in HPI above).   Objective  Virtual encounter, vitals not obtained.  Body mass index is 30.38 kg/m.  Nursing Note and Vital Signs reviewed.  Physical Exam  Awake, alert and oriented, speaking in complete sentences.   No results found for this or any previous visit (from the past 72 hour(s)).  Assessment & Plan  1. Intractable migraine with aura without status migrainosus Stop imitrex, start ubrelvy Keep appointment with neurology If worsening symptoms seek emergency care - Ubrogepant (UBRELVY) 50 MG TABS; Take 1 tablet (50 mg total) by mouth as needed (take one tablet at onset of migraine, may repeat dose after two hours).  Dispense: 30 tablet; Refill: 0  2. Dizziness Resolved at this time   -Red flags and when to present for  emergency care or RTC including fever >101.70F, chest pain, shortness of breath, new/worsening/un-resolving symptoms,  reviewed with patient at time of visit. Follow up and care instructions discussed and provided in AVS. - I discussed the assessment and treatment plan with the patient. The patient was provided an opportunity to ask questions and all were answered. The patient agreed with the plan and demonstrated an understanding of the instructions.  I provided 15 minutes of non-face-to-face time during this encounter.  Bo Merino, [name]

## 2022-10-04 ENCOUNTER — Other Ambulatory Visit: Payer: Self-pay | Admitting: Emergency Medicine

## 2022-10-04 DIAGNOSIS — I10 Essential (primary) hypertension: Secondary | ICD-10-CM

## 2022-10-04 MED ORDER — LOSARTAN POTASSIUM 25 MG PO TABS: 25.0000 mg | ORAL_TABLET | Freq: Every day | ORAL | 1 refills | Status: DC

## 2022-10-06 ENCOUNTER — Other Ambulatory Visit: Payer: Self-pay | Admitting: Emergency Medicine

## 2022-10-06 DIAGNOSIS — I10 Essential (primary) hypertension: Secondary | ICD-10-CM

## 2022-10-06 MED ORDER — LOSARTAN POTASSIUM 25 MG PO TABS: 25.0000 mg | ORAL_TABLET | Freq: Every day | ORAL | 1 refills | Status: DC

## 2022-10-11 DIAGNOSIS — M1611 Unilateral primary osteoarthritis, right hip: Secondary | ICD-10-CM | POA: Diagnosis not present

## 2022-10-19 DIAGNOSIS — M25551 Pain in right hip: Secondary | ICD-10-CM | POA: Diagnosis not present

## 2022-11-11 ENCOUNTER — Other Ambulatory Visit: Payer: Self-pay | Admitting: Emergency Medicine

## 2022-11-11 DIAGNOSIS — F419 Anxiety disorder, unspecified: Secondary | ICD-10-CM

## 2022-11-11 DIAGNOSIS — E7800 Pure hypercholesterolemia, unspecified: Secondary | ICD-10-CM

## 2022-11-11 DIAGNOSIS — M25551 Pain in right hip: Secondary | ICD-10-CM | POA: Diagnosis not present

## 2022-11-11 DIAGNOSIS — G43119 Migraine with aura, intractable, without status migrainosus: Secondary | ICD-10-CM

## 2022-11-11 DIAGNOSIS — I10 Essential (primary) hypertension: Secondary | ICD-10-CM

## 2022-11-11 MED ORDER — PROPRANOLOL HCL 20 MG PO TABS: 20.0000 mg | ORAL_TABLET | Freq: Three times a day (TID) | ORAL | 1 refills | Status: DC

## 2022-11-11 MED ORDER — BUSPIRONE HCL 5 MG PO TABS: 5.0000 mg | ORAL_TABLET | Freq: Three times a day (TID) | ORAL | 1 refills | Status: DC | PRN

## 2022-11-11 MED ORDER — ESOMEPRAZOLE MAGNESIUM 40 MG PO CPDR: 40.0000 mg | DELAYED_RELEASE_CAPSULE | Freq: Every day | ORAL | 1 refills | Status: DC

## 2022-11-11 MED ORDER — ROSUVASTATIN CALCIUM 40 MG PO TABS: 40.0000 mg | ORAL_TABLET | Freq: Every day | ORAL | 1 refills | Status: DC

## 2022-11-14 DIAGNOSIS — Z79891 Long term (current) use of opiate analgesic: Secondary | ICD-10-CM | POA: Diagnosis not present

## 2022-11-14 DIAGNOSIS — G894 Chronic pain syndrome: Secondary | ICD-10-CM | POA: Diagnosis not present

## 2022-11-14 DIAGNOSIS — M5459 Other low back pain: Secondary | ICD-10-CM | POA: Diagnosis not present

## 2022-11-14 DIAGNOSIS — Z96651 Presence of right artificial knee joint: Secondary | ICD-10-CM | POA: Diagnosis not present

## 2022-11-17 DIAGNOSIS — M5416 Radiculopathy, lumbar region: Secondary | ICD-10-CM | POA: Diagnosis not present

## 2022-11-18 ENCOUNTER — Other Ambulatory Visit: Payer: Self-pay | Admitting: Family Medicine

## 2022-11-18 DIAGNOSIS — M5416 Radiculopathy, lumbar region: Secondary | ICD-10-CM

## 2022-11-22 DIAGNOSIS — M5451 Vertebrogenic low back pain: Secondary | ICD-10-CM | POA: Diagnosis not present

## 2022-11-24 DIAGNOSIS — M25551 Pain in right hip: Secondary | ICD-10-CM | POA: Diagnosis not present

## 2022-11-25 ENCOUNTER — Ambulatory Visit
Admission: RE | Admit: 2022-11-25 | Discharge: 2022-11-25 | Disposition: A | Discharge status: Home / Self Care | Payer: Medicare HMO | Source: Ambulatory Visit | Attending: Family Medicine

## 2022-11-25 ENCOUNTER — Ambulatory Visit
Admission: RE | Admit: 2022-11-25 | Discharge: 2022-11-25 | Disposition: A | Discharge status: Home / Self Care | Payer: Medicare HMO | Source: Ambulatory Visit | Attending: Family Medicine | Admitting: Family Medicine

## 2022-11-25 DIAGNOSIS — M5126 Other intervertebral disc displacement, lumbar region: Secondary | ICD-10-CM | POA: Diagnosis not present

## 2022-11-25 DIAGNOSIS — M5416 Radiculopathy, lumbar region: Secondary | ICD-10-CM

## 2022-11-25 DIAGNOSIS — Z981 Arthrodesis status: Secondary | ICD-10-CM | POA: Diagnosis not present

## 2022-11-25 MED ORDER — IOPAMIDOL (ISOVUE-M 200) INJECTION 41%
20.0000 mL | Freq: Once | INTRAMUSCULAR | Status: AC
  Administered 2022-11-25: 20 mL via INTRATHECAL

## 2022-11-25 MED ORDER — ONDANSETRON HCL 4 MG/2ML IJ SOLN: 4.0000 mg | Freq: Once | INTRAMUSCULAR | Status: DC | PRN

## 2022-11-25 MED ORDER — DIAZEPAM 5 MG PO TABS
5.0000 mg | ORAL_TABLET | Freq: Once | ORAL | Status: AC
  Administered 2022-11-25: 5 mg via ORAL

## 2022-11-25 MED ORDER — MEPERIDINE HCL 50 MG/ML IJ SOLN
50.0000 mg | Freq: Once | INTRAMUSCULAR | Status: AC | PRN
  Administered 2022-11-25: 50 mg via INTRAMUSCULAR

## 2022-11-25 NOTE — Discharge Instructions (Signed)

## 2022-11-25 NOTE — Discharge Instr - Other Info (Addendum)
0940: pt reports pain 10/10 in lower back from myelogram procedure. See MAR.  1005: pt reports pain is better. 4/10. Resting in nursing recovery area.

## 2022-11-29 DIAGNOSIS — M5416 Radiculopathy, lumbar region: Secondary | ICD-10-CM | POA: Diagnosis not present

## 2022-12-13 DIAGNOSIS — M5416 Radiculopathy, lumbar region: Secondary | ICD-10-CM | POA: Diagnosis not present

## 2022-12-20 ENCOUNTER — Telehealth: Payer: Self-pay | Admitting: Emergency Medicine

## 2022-12-20 ENCOUNTER — Other Ambulatory Visit: Payer: Self-pay | Admitting: Nurse Practitioner

## 2022-12-20 DIAGNOSIS — Z96651 Presence of right artificial knee joint: Secondary | ICD-10-CM | POA: Diagnosis not present

## 2022-12-20 DIAGNOSIS — Z79899 Other long term (current) drug therapy: Secondary | ICD-10-CM | POA: Diagnosis not present

## 2022-12-20 DIAGNOSIS — Z79891 Long term (current) use of opiate analgesic: Secondary | ICD-10-CM | POA: Diagnosis not present

## 2022-12-20 DIAGNOSIS — G894 Chronic pain syndrome: Secondary | ICD-10-CM | POA: Diagnosis not present

## 2022-12-20 DIAGNOSIS — Z5181 Encounter for therapeutic drug level monitoring: Secondary | ICD-10-CM | POA: Diagnosis not present

## 2022-12-20 DIAGNOSIS — B379 Candidiasis, unspecified: Secondary | ICD-10-CM

## 2022-12-20 MED ORDER — FLUCONAZOLE 150 MG PO TABS
150.0000 mg | ORAL_TABLET | ORAL | 0 refills | Status: DC | PRN
Start: 2022-12-20 — End: 2023-01-02

## 2022-12-20 NOTE — Telephone Encounter (Signed)
Would like diflucan called to [facility] pharmacy. Have a yeast infection. Patient think it maybe coming from the [place]

## 2022-12-21 DIAGNOSIS — M25561 Pain in right knee: Secondary | ICD-10-CM | POA: Insufficient documentation

## 2022-12-21 DIAGNOSIS — Z96651 Presence of right artificial knee joint: Secondary | ICD-10-CM | POA: Diagnosis not present

## 2022-12-27 ENCOUNTER — Encounter: Payer: Self-pay | Admitting: Orthopedic Surgery

## 2022-12-27 ENCOUNTER — Ambulatory Visit
Admission: RE | Admit: 2022-12-27 | Discharge: 2022-12-27 | Disposition: A | Discharge status: Home / Self Care | Payer: Medicare HMO | Source: Ambulatory Visit | Attending: Orthopedic Surgery | Admitting: Orthopedic Surgery

## 2022-12-27 ENCOUNTER — Other Ambulatory Visit: Payer: Self-pay | Admitting: Orthopedic Surgery

## 2022-12-27 DIAGNOSIS — M5416 Radiculopathy, lumbar region: Secondary | ICD-10-CM | POA: Diagnosis not present

## 2022-12-27 DIAGNOSIS — M5451 Vertebrogenic low back pain: Secondary | ICD-10-CM

## 2022-12-27 DIAGNOSIS — M549 Dorsalgia, unspecified: Secondary | ICD-10-CM | POA: Diagnosis not present

## 2022-12-27 DIAGNOSIS — M79606 Pain in leg, unspecified: Secondary | ICD-10-CM | POA: Diagnosis not present

## 2022-12-27 DIAGNOSIS — M9903 Segmental and somatic dysfunction of lumbar region: Secondary | ICD-10-CM | POA: Diagnosis not present

## 2022-12-27 DIAGNOSIS — I700 Atherosclerosis of aorta: Secondary | ICD-10-CM | POA: Diagnosis not present

## 2022-12-30 ENCOUNTER — Other Ambulatory Visit: Payer: Self-pay | Admitting: Emergency Medicine

## 2022-12-30 DIAGNOSIS — J4540 Moderate persistent asthma, uncomplicated: Secondary | ICD-10-CM

## 2022-12-30 DIAGNOSIS — M5451 Vertebrogenic low back pain: Secondary | ICD-10-CM | POA: Diagnosis not present

## 2022-12-30 MED ORDER — BUDESONIDE-FORMOTEROL FUMARATE 160-4.5 MCG/ACT IN AERO
2.0000 | INHALATION_SPRAY | Freq: Two times a day (BID) | RESPIRATORY_TRACT | 5 refills | Status: DC
Start: 2022-12-30 — End: 2023-02-02

## 2023-01-02 ENCOUNTER — Encounter: Payer: Self-pay | Admitting: Nurse Practitioner

## 2023-01-02 ENCOUNTER — Ambulatory Visit (INDEPENDENT_AMBULATORY_CARE_PROVIDER_SITE_OTHER): Payer: Medicare HMO | Admitting: Nurse Practitioner

## 2023-01-02 ENCOUNTER — Other Ambulatory Visit: Payer: Self-pay

## 2023-01-02 VITALS — BP 130/82 | HR 76 | Temp 97.8°F | Resp 16 | Ht 64.0 in

## 2023-01-02 DIAGNOSIS — M9903 Segmental and somatic dysfunction of lumbar region: Secondary | ICD-10-CM

## 2023-01-02 DIAGNOSIS — Z7985 Long-term (current) use of injectable non-insulin antidiabetic drugs: Secondary | ICD-10-CM

## 2023-01-02 DIAGNOSIS — I10 Essential (primary) hypertension: Secondary | ICD-10-CM

## 2023-01-02 DIAGNOSIS — Z01818 Encounter for other preprocedural examination: Secondary | ICD-10-CM | POA: Diagnosis not present

## 2023-01-02 DIAGNOSIS — J4540 Moderate persistent asthma, uncomplicated: Secondary | ICD-10-CM | POA: Diagnosis not present

## 2023-01-02 DIAGNOSIS — E1129 Type 2 diabetes mellitus with other diabetic kidney complication: Secondary | ICD-10-CM | POA: Diagnosis not present

## 2023-01-02 DIAGNOSIS — R809 Proteinuria, unspecified: Secondary | ICD-10-CM | POA: Diagnosis not present

## 2023-01-02 LAB — CBC WITH DIFFERENTIAL/PLATELET
HCT: 37.4 % (ref 35.0–45.0)
Lymphs Abs: 1104 cells/uL (ref 850–3900)
MCH: 25 pg — ABNORMAL LOW (ref 27.0–33.0)
Monocytes Relative: 7.5 %

## 2023-01-02 NOTE — Assessment & Plan Note (Signed)
Breathing doing well per patient.  Medically optimized for surgery

## 2023-01-02 NOTE — Assessment & Plan Note (Signed)
Blood pressure was 130/82. Medically optimized for surgery

## 2023-01-02 NOTE — Progress Notes (Addendum)
BP 130/82   Pulse 76   Temp 97.8 F (36.6 C) (Oral)   Resp 16   Ht 5\' 4"  (1.626 m)   SpO2 96%   BMI 30.38 kg/m    Subjective:    Patient ID: Lori Potts, female    DOB: 24-Oct-1945, 77 y.o.   MRN: [id number]  HPI: Lori Potts is a 77 y.o. female  Chief Complaint  Patient presents with   Procedure    Surgical clearance for back surgery Dr.Brooks, [facility]   Surgical clearance/lumbar fusion: she reports she is going to have a lumbar fusion with Dr. Shon Baton.   She reports her breathing has been good. Her last A1C was 7.2 on 09/15/2022. She has not been monitoring her blood sugar, will get A1C.  gupta score with previous labs was 0.4 %. medically optimized for surgery, pending labs. She will need to be cleared by cardiology.    Relevant past medical, surgical, family and social history reviewed and updated as indicated. Interim medical history since our last visit reviewed. Allergies and medications reviewed and updated.  Review of Systems Constitutional: Negative for fever or weight change.  Respiratory: Negative for cough and shortness of breath.   Cardiovascular: Negative for chest pain or palpitations.  Gastrointestinal: Negative for abdominal pain, no bowel changes.  Musculoskeletal: Negative for gait problem or joint swelling.  Skin: Negative for rash.  Neurological: Negative for dizziness or headache.  No other specific complaints in a complete review of systems (except as listed in HPI above).      Objective:    BP 130/82   Pulse 76   Temp 97.8 F (36.6 C) (Oral)   Resp 16   Ht 5\' 4"  (1.626 m)   SpO2 96%   BMI 30.38 kg/m   Wt Readings from Last 3 Encounters:  09/20/22 177 lb (80.3 kg)  09/15/22 177 lb 9.6 oz (80.6 kg)  06/08/22 186 lb (84.4 kg)    Physical Exam  Constitutional: Patient appears well-developed and well-nourished.  No distress.  HEENT: head atraumatic, normocephalic, pupils equal and reactive to light, neck  supple, throat within normal limits Cardiovascular: Normal rate, regular rhythm and normal heart sounds.  No murmur heard. No BLE edema. Pulmonary/Chest: Effort normal and breath sounds normal. No respiratory distress. Abdominal: Soft.  There is no tenderness. Psychiatric: Patient has a normal mood and affect. behavior is normal. Judgment and thought content normal.   Results for orders placed or performed in visit on 09/15/22  CBC with Differential/Platelet  Result Value Ref Range   WBC 5.3 3.8 - 10.8 Thousand/uL   RBC 4.73 3.80 - 5.10 Million/uL   Hemoglobin 11.6 (L) 11.7 - 15.5 g/dL   HCT 40.9 81.1 - 91.4 %   MCV 78.9 (L) 80.0 - 100.0 fL   MCH 24.5 (L) 27.0 - 33.0 pg   MCHC 31.1 (L) 32.0 - 36.0 g/dL   RDW 78.2 95.6 - 21.3 %   Platelets 487 (H) 140 - 400 Thousand/uL   MPV 10.6 7.5 - 12.5 fL   Neutro Abs 3,366 1,500 - 7,800 cells/uL   Lymphs Abs 1,272 850 - 3,900 cells/uL   Absolute Monocytes 355 200 - 950 cells/uL   Eosinophils Absolute 254 15 - 500 cells/uL   Basophils Absolute 53 0 - 200 cells/uL   Neutrophils Relative % 63.5 %   Total Lymphocyte 24.0 %   Monocytes Relative 6.7 %   Eosinophils Relative 4.8 %   Basophils Relative 1.0 %  COMPLETE METABOLIC PANEL WITH GFR  Result Value Ref Range   Glucose, Bld 115 (H) 65 - 99 mg/dL   BUN 16 7 - 25 mg/dL   Creat 1.61 (H) 0.96 - 1.00 mg/dL   eGFR 47 (L) > OR = 60 mL/min/1.79m2   BUN/Creatinine Ratio 13 6 - 22 (calc)   Sodium 142 135 - 146 mmol/L   Potassium 4.1 3.5 - 5.3 mmol/L   Chloride 107 98 - 110 mmol/L   CO2 27 20 - 32 mmol/L   Calcium 10.2 8.6 - 10.4 mg/dL   Total Protein 6.8 6.1 - 8.1 g/dL   Albumin 4.1 3.6 - 5.1 g/dL   Globulin 2.7 1.9 - 3.7 g/dL (calc)   AG Ratio 1.5 1.0 - 2.5 (calc)   Total Bilirubin 0.3 0.2 - 1.2 mg/dL   Alkaline phosphatase (APISO) 73 37 - 153 U/L   AST 15 10 - 35 U/L   ALT 12 6 - 29 U/L  Lipid panel  Result Value Ref Range   Cholesterol 156 <200 mg/dL   HDL 58 > OR = 50 mg/dL    Triglycerides 91 <045 mg/dL   LDL Cholesterol (Calc) 81 mg/dL (calc)   Total CHOL/HDL Ratio 2.7 <5.0 (calc)   Non-HDL Cholesterol (Calc) 98 <409 mg/dL (calc)  Hemoglobin W1X  Result Value Ref Range   Hgb A1c MFr Bld 7.2 (H) <5.7 % of total Hgb   Mean Plasma Glucose 160 mg/dL   eAG (mmol/L) 8.9 mmol/L      Assessment & Plan:   Problem List Items Addressed This Visit       Cardiovascular and Mediastinum   Essential (primary) hypertension    Blood pressure was 130/82. Medically optimized for surgery      Relevant Orders   CBC with Differential/Platelet   COMPLETE METABOLIC PANEL WITH GFR     Respiratory   Moderate persistent asthma without complication    Breathing doing well per patient.  Medically optimized for surgery        Endocrine   Type 2 diabetes mellitus with microalbuminuria, without long-term current use of insulin ([facility])    Last A1C 7.2.  Medically optimized for surgery      Relevant Orders   COMPLETE METABOLIC PANEL WITH GFR   Hemoglobin A1c   Other Visit Diagnoses     Preoperative examination    -  Primary   medically optomized for surgery, needs clearance from cardiology   Segmental dysfunction of lumbar region       medically optomized for surgery,  needs clearance from cardiology        Follow up plan: Return in about 4 months (around 05/05/2023) for follow up.

## 2023-01-02 NOTE — Assessment & Plan Note (Signed)
Last A1C 7.2.  Medically optimized for surgery

## 2023-01-03 LAB — COMPLETE METABOLIC PANEL WITH GFR
AG Ratio: 1.5 (calc) (ref 1.0–2.5)
ALT: 10 U/L (ref 6–29)
AST: 16 U/L (ref 10–35)
Albumin: 4.1 g/dL (ref 3.6–5.1)
Alkaline phosphatase (APISO): 73 U/L (ref 37–153)
BUN/Creatinine Ratio: 17 (calc) (ref 6–22)
BUN: 20 mg/dL (ref 7–25)
CO2: 26 mmol/L (ref 20–32)
Calcium: 10.8 mg/dL — ABNORMAL HIGH (ref 8.6–10.4)
Chloride: 105 mmol/L (ref 98–110)
Creat: 1.15 mg/dL — ABNORMAL HIGH (ref 0.60–1.00)
Globulin: 2.7 g/dL (calc) (ref 1.9–3.7)
Glucose, Bld: 128 mg/dL — ABNORMAL HIGH (ref 65–99)
Potassium: 3.9 mmol/L (ref 3.5–5.3)
Sodium: 141 mmol/L (ref 135–146)
Total Bilirubin: 0.5 mg/dL (ref 0.2–1.2)
Total Protein: 6.8 g/dL (ref 6.1–8.1)
eGFR: 49 mL/min/{1.73_m2} — ABNORMAL LOW (ref >=60)

## 2023-01-03 LAB — CBC WITH DIFFERENTIAL/PLATELET
Absolute Monocytes: 450 cells/uL (ref 200–950)
Basophils Absolute: 60 cells/uL (ref 0–200)
Basophils Relative: 1 %
Eosinophils Absolute: 258 cells/uL (ref 15–500)
Eosinophils Relative: 4.3 %
Hemoglobin: 12 g/dL (ref 11.7–15.5)
MCHC: 32.1 g/dL (ref 32.0–36.0)
MCV: 77.9 fL — ABNORMAL LOW (ref 80.0–100.0)
MPV: 11 fL (ref 7.5–12.5)
Neutro Abs: 4128 cells/uL (ref 1500–7800)
Neutrophils Relative %: 68.8 %
Platelets: 469 10*3/uL — ABNORMAL HIGH (ref 140–400)
RBC: 4.8 10*6/uL (ref 3.80–5.10)
RDW: 15.7 % — ABNORMAL HIGH (ref 11.0–15.0)
Total Lymphocyte: 18.4 %
WBC: 6 10*3/uL (ref 3.8–10.8)

## 2023-01-03 LAB — HEMOGLOBIN A1C
Hgb A1c MFr Bld: 7.8 % of total Hgb — ABNORMAL HIGH (ref <5.7)
Mean Plasma Glucose: 177 mg/dL
eAG (mmol/L): 9.8 mmol/L

## 2023-01-05 ENCOUNTER — Ambulatory Visit (HOSPITAL_COMMUNITY): Payer: Self-pay | Admitting: Orthopedic Surgery

## 2023-01-05 DIAGNOSIS — Z01818 Encounter for other preprocedural examination: Secondary | ICD-10-CM | POA: Diagnosis not present

## 2023-01-05 DIAGNOSIS — E7800 Pure hypercholesterolemia, unspecified: Secondary | ICD-10-CM | POA: Diagnosis not present

## 2023-01-05 DIAGNOSIS — E119 Type 2 diabetes mellitus without complications: Secondary | ICD-10-CM | POA: Diagnosis not present

## 2023-01-05 DIAGNOSIS — R0602 Shortness of breath: Secondary | ICD-10-CM | POA: Diagnosis not present

## 2023-01-05 DIAGNOSIS — R002 Palpitations: Secondary | ICD-10-CM | POA: Diagnosis not present

## 2023-01-05 DIAGNOSIS — I10 Essential (primary) hypertension: Secondary | ICD-10-CM | POA: Diagnosis not present

## 2023-01-19 NOTE — Pre-Procedure Instructions (Signed)
Surgical Instructions    Your procedure is scheduled on Monday, June 10  Report to [facility] Main Entrance "A" at 5:30 A.M., then check in with the Admitting office.  Call this number if you have problems the morning of surgery:  [phone]   If you have any questions prior to your surgery date call [phone]: Open Monday-Friday 8am-4pm If you experience any cold or flu symptoms such as cough, fever, chills, shortness of breath, etc. between now and your scheduled surgery, please notify us at the above number     Remember:  Do not eat after midnight the night before your surgery  You may drink clear liquids until 4:30AM the morning of your surgery.   Clear liquids allowed are: Water, Non-Citrus Juices (without pulp), Carbonated Beverages, Clear Tea, Black Coffee ONLY (NO MILK, CREAM OR POWDERED CREAMER of any kind), and Gatorade    Take these medicines the morning of surgery with A SIP OF WATER:  Budeson-Glycopyrrol-Formoterol (BREZTRI AEROSPHERE)  citalopram (CELEXA)  Oxycodone  propranolol (INDERAL)  rosuvastatin (CRESTOR)   If needed: albuterol (PROVENTIL) (2.5 MG/3ML) 0.083% nebulizer solution  albuterol (VENTOLIN HFA) 108 (90 Base) MCG/ACT inhaler - Please bring all inhalers with you the day of surgery.  azelastine (ASTELIN) 0.1 % nasal spray  promethazine (PHENERGAN)   Follow your surgeon's instructions on when to stop Aspirin.  If no instructions were given by your surgeon then you will need to call the office to get those instructions.  Hold for 7 days prior to surgery.   As of today, STOP taking any Aleve, Naproxen, Ibuprofen, Motrin, Advil, Goody's, BC's, all herbal medications, fish oil, and all vitamins.          WHAT DO I DO ABOUT MY DIABETES MEDICATION?   Do not take oral diabetes medicines (pills) the morning of surgery.   DO NOT TAKE dapagliflozin propanediol (FARXIGA) for 72 hours prior to surgery. (Last dose should be on 01/26/23)  DO NOT TAKE  Dulaglutide (TRULICITY) for 7 days prior to surgery. (DO NOT take after 01/22/23)   The day of surgery, do not take other diabetes injectables, including Byetta (exenatide), Bydureon (exenatide ER), Victoza (liraglutide), or Trulicity (dulaglutide).     HOW TO MANAGE YOUR DIABETES BEFORE AND AFTER SURGERY  Why is it important to control my blood sugar before and after surgery? Improving blood sugar levels before and after surgery helps healing and can limit problems. A way of improving blood sugar control is eating a healthy diet by:  Eating less sugar and carbohydrates  Increasing activity/exercise  Talking with your doctor about reaching your blood sugar goals High blood sugars (greater than 180 mg/dL) can raise your risk of infections and slow your recovery, so you will need to focus on controlling your diabetes during the weeks before surgery. Make sure that the doctor who takes care of your diabetes knows about your planned surgery including the date and location.  How do I manage my blood sugar before surgery? Check your blood sugar at least 4 times a day, starting 2 days before surgery, to make sure that the level is not too high or low.  Check your blood sugar the morning of your surgery when you wake up and every 2 hours until you get to the Short Stay unit.  If your blood sugar is less than 70 mg/dL, you will need to treat for low blood sugar: Do not take insulin. Treat a low blood sugar (less than 70 mg/dL) with  cup  of clear juice (cranberry or apple), 4 glucose tablets, OR glucose gel. Recheck blood sugar in 15 minutes after treatment (to make sure it is greater than 70 mg/dL). If your blood sugar is not greater than 70 mg/dL on recheck, call [phone] for further instructions. Report your blood sugar to the short stay nurse when you get to Short Stay.  If you are admitted to the hospital after surgery: Your blood sugar will be checked by the staff and you will probably  be given insulin after surgery (instead of oral diabetes medicines) to make sure you have good blood sugar levels. The goal for blood sugar control after surgery is 80-180 mg/dL.    Person is not responsible for any belongings or valuables.    Do NOT Smoke (Tobacco/Vaping)  24 hours prior to your procedure  If you use a CPAP at night, you may bring your mask for your overnight stay.   Contacts, glasses, hearing aids, dentures or partials may not be worn into surgery, please bring cases for these belongings   For patients admitted to the hospital, discharge time will be determined by your treatment team.   Patients discharged the day of surgery will not be allowed to drive home, and someone needs to stay with them for 24 hours.   SURGICAL WAITING ROOM VISITATION Patients having surgery or a procedure may have no more than 2 support people in the waiting area - these visitors may rotate.   Children under the age of 1 must have an adult with them who is not the patient. If the patient needs to stay at the hospital during part of their recovery, the visitor guidelines for inpatient rooms apply. Pre-op nurse will coordinate an appropriate time for 1 support person to accompany patient in pre-op.  This support person may not rotate.   Please refer to [email or web address] for the visitor guidelines for Inpatients (after your surgery is over and you are in a regular room).    Special instructions:    Oral Hygiene is also important to reduce your risk of infection.  Remember - BRUSH YOUR TEETH THE MORNING OF SURGERY WITH YOUR REGULAR TOOTHPASTE    Pre-operative 5 CHG Bath Instructions   You can play a key role in reducing the risk of infection after surgery. Your skin needs to be as free of germs as possible. You can reduce the number of germs on your skin by washing with CHG (chlorhexidine gluconate) soap before surgery. CHG is an  antiseptic soap that kills germs and continues to kill germs even after washing.   DO NOT use if you have an allergy to chlorhexidine/CHG or antibacterial soaps. If your skin becomes reddened or irritated, stop using the CHG and notify one of our RNs at [phone].   Please shower with the CHG soap starting 4 days before surgery using the following schedule:     Please keep in mind the following:  DO NOT shave, including legs and underarms, starting the day of your first shower.   You may shave your face at any point before/day of surgery.  Place clean sheets on your bed the day you start using CHG soap. Use a clean washcloth (not used since being washed) for each shower. DO NOT sleep with pets once you start using the CHG.   CHG Shower Instructions:  If you choose to wash your hair and private area, wash first with your normal shampoo/soap.  After you use shampoo/soap, rinse your hair and body  thoroughly to remove shampoo/soap residue.  Turn the water OFF and apply about 3 tablespoons (45 ml) of CHG soap to a CLEAN washcloth.  Apply CHG soap ONLY FROM YOUR NECK DOWN TO YOUR TOES (washing for 3-5 minutes)  DO NOT use CHG soap on face, private areas, open wounds, or sores.  Pay special attention to the area where your surgery is being performed.  If you are having back surgery, having someone wash your back for you may be helpful. Wait 2 minutes after CHG soap is applied, then you may rinse off the CHG soap.  Pat dry with a clean towel  Put on clean clothes/pajamas   If you choose to wear lotion, please use ONLY the CHG-compatible lotions on the back of this paper.     Additional instructions for the day of surgery: DO NOT APPLY any lotions, deodorants, cologne, or perfumes.   Put on clean/comfortable clothes.  Brush your teeth.  Ask your nurse before applying any prescription medications to the skin.      CHG Compatible Lotions   Aveeno Moisturizing lotion  Cetaphil  Moisturizing Cream  Cetaphil Moisturizing Lotion  Clairol Herbal Essence Moisturizing Lotion, Dry Skin  Clairol Herbal Essence Moisturizing Lotion, Extra Dry Skin  Clairol Herbal Essence Moisturizing Lotion, Normal Skin  Curel Age Defying Therapeutic Moisturizing Lotion with Alpha Hydroxy  Curel Extreme Care Body Lotion  Curel Soothing Hands Moisturizing Hand Lotion  Curel Therapeutic Moisturizing Cream, Fragrance-Free  Curel Therapeutic Moisturizing Lotion, Fragrance-Free  Curel Therapeutic Moisturizing Lotion, Original Formula  Eucerin Daily Replenishing Lotion  Eucerin Dry Skin Therapy Plus Alpha Hydroxy Crme  Eucerin Dry Skin Therapy Plus Alpha Hydroxy Lotion  Eucerin Original Crme  Eucerin Original Lotion  Eucerin Plus Crme Eucerin Plus Lotion  Eucerin TriLipid Replenishing Lotion  Keri Anti-Bacterial Hand Lotion  Keri Deep Conditioning Original Lotion Dry Skin Formula Softly Scented  Keri Deep Conditioning Original Lotion, Fragrance Free Sensitive Skin Formula  Keri Lotion Fast Absorbing Fragrance Free Sensitive Skin Formula  Keri Lotion Fast Absorbing Softly Scented Dry Skin Formula  Keri Original Lotion  Keri Skin Renewal Lotion Keri Silky Smooth Lotion  Keri Silky Smooth Sensitive Skin Lotion  Nivea Body Creamy Conditioning Oil  Nivea Body Extra Enriched Lotion  Nivea Body Original Lotion  Nivea Body Sheer Moisturizing Lotion Nivea Crme  Nivea Skin Firming Lotion  NutraDerm 30 Skin Lotion  NutraDerm Skin Lotion  NutraDerm Therapeutic Skin Cream  NutraDerm Therapeutic Skin Lotion  ProShield Protective Hand Cream  Provon moisturizing lotion    Day of Surgery:  Take a shower with CHG soap. Wear Clean/Comfortable clothing the morning of surgery Do not wear jewelry or makeup. Do not wear lotions, powders, perfumes/cologne or deodorant. Do not shave 48 hours prior to surgery.  Men may shave face and neck. Do not bring valuables to the hospital. Do not wear nail  polish, gel polish, artificial nails, or any other type of covering on natural nails (fingers and toes) If you have artificial nails or gel coating that need to be removed by a nail salon, please have this removed prior to surgery. Artificial nails or gel coating may interfere with anesthesia's ability to adequately monitor your vital signs. Remember to brush your teeth WITH YOUR REGULAR TOOTHPASTE.  *PLEASE BRING your spinal cord stimulator controller with you on the day of you surgery*  If you received a COVID test during your pre-op visit, it is requested that you wear a mask when out in public, stay away from  anyone that may not be feeling well, and notify your surgeon if you develop symptoms. If you have been in contact with anyone that has tested positive in the last 10 days, please notify your surgeon.    Please read over the following fact sheets that you were given.

## 2023-01-20 ENCOUNTER — Encounter (HOSPITAL_COMMUNITY)
Admission: RE | Admit: 2023-01-20 | Discharge: 2023-01-20 | Disposition: A | Discharge status: Home / Self Care | Payer: Medicare HMO | Source: Ambulatory Visit | Attending: Orthopedic Surgery | Admitting: Orthopedic Surgery

## 2023-01-20 ENCOUNTER — Encounter (HOSPITAL_COMMUNITY): Payer: Self-pay

## 2023-01-20 ENCOUNTER — Other Ambulatory Visit: Payer: Self-pay

## 2023-01-20 VITALS — BP 134/67 | HR 63 | Temp 98.3°F | Resp 17 | Ht 64.0 in | Wt 175.0 lb

## 2023-01-20 DIAGNOSIS — K219 Gastro-esophageal reflux disease without esophagitis: Secondary | ICD-10-CM | POA: Diagnosis not present

## 2023-01-20 DIAGNOSIS — J4540 Moderate persistent asthma, uncomplicated: Secondary | ICD-10-CM | POA: Insufficient documentation

## 2023-01-20 DIAGNOSIS — I2510 Atherosclerotic heart disease of native coronary artery without angina pectoris: Secondary | ICD-10-CM | POA: Insufficient documentation

## 2023-01-20 DIAGNOSIS — Z7951 Long term (current) use of inhaled steroids: Secondary | ICD-10-CM | POA: Insufficient documentation

## 2023-01-20 DIAGNOSIS — E1129 Type 2 diabetes mellitus with other diabetic kidney complication: Secondary | ICD-10-CM

## 2023-01-20 DIAGNOSIS — M5136 Other intervertebral disc degeneration, lumbar region: Secondary | ICD-10-CM | POA: Insufficient documentation

## 2023-01-20 DIAGNOSIS — E1136 Type 2 diabetes mellitus with diabetic cataract: Secondary | ICD-10-CM | POA: Diagnosis not present

## 2023-01-20 DIAGNOSIS — E785 Hyperlipidemia, unspecified: Secondary | ICD-10-CM | POA: Insufficient documentation

## 2023-01-20 DIAGNOSIS — I10 Essential (primary) hypertension: Secondary | ICD-10-CM | POA: Insufficient documentation

## 2023-01-20 DIAGNOSIS — Z87891 Personal history of nicotine dependence: Secondary | ICD-10-CM | POA: Diagnosis not present

## 2023-01-20 DIAGNOSIS — I700 Atherosclerosis of aorta: Secondary | ICD-10-CM | POA: Diagnosis not present

## 2023-01-20 DIAGNOSIS — Z01818 Encounter for other preprocedural examination: Secondary | ICD-10-CM | POA: Insufficient documentation

## 2023-01-20 DIAGNOSIS — M48061 Spinal stenosis, lumbar region without neurogenic claudication: Secondary | ICD-10-CM | POA: Insufficient documentation

## 2023-01-20 DIAGNOSIS — F419 Anxiety disorder, unspecified: Secondary | ICD-10-CM | POA: Insufficient documentation

## 2023-01-20 LAB — SURGICAL PCR SCREEN
MRSA, PCR: NEGATIVE
Staphylococcus aureus: NEGATIVE

## 2023-01-20 LAB — TYPE AND SCREEN
ABO/RH(D): O POS
Antibody Screen: NEGATIVE

## 2023-01-20 LAB — GLUCOSE, CAPILLARY: Glucose-Capillary: 103 mg/dL — ABNORMAL HIGH (ref 70–99)

## 2023-01-20 NOTE — Progress Notes (Signed)
PCP - [name] [name] Cardiologist - Dr. Dorothyann Peng  PPM/ICD - Denies  Spinat Cord Stimulator: Patient states it no longer works, but instructed to bring remote anyway.  Chest x-ray - N/A EKG - 04/07/22 Stress Test - Denies ECHO - 11/22/21 Cardiac Cath - 09/10/18  Sleep Study - Denies  Fasting Blood Sugar - 115 Checks Blood Sugar __2___ times a day  Last dose of GLP1 agonist-  Farxiga (6/6); Trulicity (6/2) GLP1 instructions: Farxiga (72 hrs); Trulicity (7 days)  Blood Thinner Instructions: N/A Aspirin Instructions: Stop 7 days prior to surgery. LD: 6/2  ERAS Protcol - Yes PRE-SURGERY Ensure or G2- No  COVID TEST- N/A   Anesthesia review: Yes, cardiac clearance 01/05/23  Patient denies shortness of breath, fever, cough and chest pain at PAT appointment   All instructions explained to the patient, with a verbal understanding of the material. Patient agrees to go over the instructions while at home for a better understanding. Patient also instructed to self quarantine after being tested for COVID-19. The opportunity to ask questions was provided.

## 2023-01-23 DIAGNOSIS — M9903 Segmental and somatic dysfunction of lumbar region: Secondary | ICD-10-CM | POA: Diagnosis not present

## 2023-01-23 DIAGNOSIS — M5416 Radiculopathy, lumbar region: Secondary | ICD-10-CM | POA: Diagnosis not present

## 2023-01-23 NOTE — Anesthesia Preprocedure Evaluation (Addendum)
Anesthesia Evaluation  Patient identified by MRN, date of birth, ID band Patient awake    Reviewed: Allergy & Precautions, H&P , NPO status , Patient's Chart, lab work & pertinent test results  Airway Mallampati: II  TM Distance: >3 FB Neck ROM: Full    Dental  (+) Edentulous Upper, Edentulous Lower   Pulmonary asthma , former smoker   Pulmonary exam normal breath sounds clear to auscultation       Cardiovascular hypertension, Pt. on medications Normal cardiovascular exam Rhythm:Regular Rate:Normal     Neuro/Psych  Headaches  Anxiety Depression     negative psych ROS   GI/Hepatic Neg liver ROS,GERD  ,,  Endo/Other  diabetes, Type 2    Renal/GU Renal InsufficiencyRenal disease  negative genitourinary   Musculoskeletal  (+) Arthritis , Osteoarthritis,    Abdominal  (+) + obese  Peds negative pediatric ROS (+)  Hematology negative hematology ROS (+)   Anesthesia Other Findings   Reproductive/Obstetrics negative OB ROS                             Anesthesia Physical Anesthesia Plan  ASA: 3  Anesthesia Plan: General   Post-op Pain Management: Regional block* and Dilaudid IV   Induction: Intravenous  PONV Risk Score and Plan: 3 and Ondansetron, Dexamethasone, Midazolam and Treatment may vary due to age or medical condition  Airway Management Planned: Oral ETT  Additional Equipment:   Intra-op Plan:   Post-operative Plan: Extubation in OR  Informed Consent: I have reviewed the patients History and Physical, chart, labs and discussed the procedure including the risks, benefits and alternatives for the proposed anesthesia with the patient or authorized representative who has indicated his/her understanding and acceptance.     Dental advisory given  Plan Discussed with: CRNA  Anesthesia Plan Comments: (PAT note written 01/23/2023 by Shonna Chock, PA-C.  )        Anesthesia Quick Evaluation

## 2023-01-23 NOTE — Progress Notes (Signed)
Anesthesia Chart Review:  Case: [id number] Date/Time: 01/30/23 0715   Procedure: ANTERIOR LATERAL LUMBAR FUSION WITH PERCUTANEOUS SCREW 1 LEVEL (XLIF L2-3, POSTERIOR PEDICLE SCREW FIXATION AT L2, EXTENSION L3-5 CONSTRUCT) TO L2 - 5 HRS 3 C-BED LEFT TAP BLOCK WITH EXPAREL   Anesthesia type: General   Pre-op diagnosis: Lumbar spinal stenosis with degenerative disc disease L2-3 and prior L3-S1 fusion   [facility] / [facility]   Surgeons: Venita Lick, MD       DISCUSSION: Patient is a 77 year old female scheduled for the above procedure.   History includes former smoker (quit 08/22/04), HTN, HLD, aortic calcifications, DM2, asthma, GERD, osteoarthritis (left TKA 09/08/09; right TKA 07/28/17; left THA 06/08/22), spinal surgery (L5-S1 PLIF/posterolateral fusion 09/26/01; L4-5 PLIF, removal of L5-S1 instrumentation 08/19/03; spinal cord stimulator 01/10/08, battery removal 10/17/12 with reimplantation of new SCS 07/22/15; L3-4 ALIF/XLIF 03/21/13; C4-5 ACDF 02/26/15). Normal coronaries, possibly small vessel disease microvascular disease by 2020 [facility].   She had medical clearance evaluation on 01/01/22 with [name], [name]. Moderate persistent asthma was doing well and felt optimized for surgery. She is on albuterol MDI or nebulizer as needed, Breztri 2 puffs BID, Singulair 10 mg Q HS. She wrote, "She reports her breathing has been good. Her last A1C was 7.2 on 09/15/2022. She has not been monitoring her blood sugar, will get A1C.  gupta score with previous labs was 0.4 %. medically optimized for surgery, pending labs. She will need to be cleared by cardiology." A1c at that visit was up to 7.8%, so message left for [facility] at Dr. Shon Baton' office regarding result.   Cardiologist is Dr. Juliann Pares. He saw patient on 01/05/23 for preoperative evaluation. He wrote, "Pre-operative clearance, patient is cleared for upcoming back surgery at [facility] with Dr. Shon Baton on June 10th from a cardiac standpoint,  is at mild to moderate risk. SOB, intermittent, continue conservative management.  Palpitations, stable, heart rate today is 67 bpm, continue propranolol Essential Hypertension, well controlled, continue current therapy with losartan and propanolol Hyperlipidemia, continue Crestor therapy for lipid management Type 2 DM, on Trulicity, follow up with PCP Have patient follow up in 1 year". He also gave permission in letter to hold ASA 7 days prior to surgery and resume 2 days after.   She reported that her spinal cord stimulator in no longer working but was instructed to bring in the remote.   Plan to hold Farxiga after 01/26/23 dose and Trulicity after 01/22/23 dose.    Anesthesia team to evaluate on the day of surgery. Last ASA planned for 01/22/23.   VS: BP 134/67   Pulse 63   Temp 36.8 C   Resp 17   Ht 5\' 4"  (1.626 m)   Wt 79.4 kg   SpO2 98%   BMI 30.04 kg/m    PROVIDERS: [name], [name] is PCP  Dorothyann Peng, MD is cardiologist [facility], [facility]) She saw pulmonologist Sarina Ser, MD once on 11/12/22 for dyspnea with persistent asthma. PFTs as below. Asthma is currently managed by her PCP.    LABS: Last labs noted are from 01/02/23 with T&S on 01/02/23. Results included:  Lab Results  Component Value Date   WBC 6.0 01/02/2023   HGB 12.0 01/02/2023   HCT 37.4 01/02/2023   PLT 469 (H) 01/02/2023   GLUCOSE 128 (H) 01/02/2023   CHOL 156 09/15/2022   TRIG 91 09/15/2022   HDL 58 09/15/2022   LDLCALC 81 09/15/2022  ALT 10 01/02/2023   AST 16 01/02/2023   NA 141 01/02/2023   K 3.9 01/02/2023   CL 105 01/02/2023   CREATININE 1.15 (H) 01/02/2023   BUN 20 01/02/2023   CO2 26 01/02/2023   HGBA1C 7.8 (H) 01/02/2023   MICROALBUR 15.2 05/31/2022    PFTs 12/22/21: FVC 1.09 (49%), post 1.51 (68%). FEV1 0.85 (49%), post 1.11 (65%). DLCO unc 13.82 (72%). Conclusions: Although there is severe airway obstruction and a diffusion defect suggesting emphysema, the  absence of overinflation indicates a concurrent restrictive process which may account for the diffusion defect.  The response to bronchodilators indicates a reversible component. Pulmonary function diagnosis: Severe obstructive airways disease Restriction - probable Minimal diffusion defect   IMAGES: CT L-spine 12/27/22: IMPRESSION: 1. Stable fusion hardware L3-S1. No complicating features associated with the hardware. 2. Moderate degenerate disc disease at L1-2 and L2-3 with lateral recess and foraminal stenosis as above. No change since CT myelogram. - Aortic Atherosclerosis (ICD10-I70.0).  CT Chest High Resolution 5/10/223: IMPRESSION: 1. Negative for interstitial lung disease. No pulmonary parenchymal findings to explain the patient's shortness of breath. 2. Aortic atherosclerosis (ICD10-I70.0). Coronary artery calcification.      EKG: EKG 05/17/22 [facility] Cardiology): By Narrative in [facility] Everywhere: Copy of EKG tracing requested. Normal sinus rhythm  Possible Left atrial enlargement  Borderline ECG  EKG 04/07/22 (Limb leads only): NSR    CV: Echocardiogram 2D complete 11/22/2021: IMPRESSIONS  1. Left ventricular ejection fraction, by estimation, is 60 to 65%. The left ventricle has normal function. The left ventricle has no regional wall motion abnormalities. There is mild left ventricular hypertrophy. Left ventricular diastolic parameters  are consistent with Grade I diastolic dysfunction (impaired relaxation). The average left ventricular global longitudinal strain is -17.6 %.  2. Right ventricular systolic function is normal. The right ventricular size is normal.  3. The mitral valve is normal in structure. No evidence of mitral valve regurgitation.  4. The aortic valve is tricuspid. Aortic valve regurgitation is not visualized. Aortic valve sclerosis is present, with no evidence of aortic valve stenosis.  5. The inferior vena cava is normal in size with greater  than 50% respiratory variability, suggesting right atrial pressure of 3 mmHg.    Cardiac Catheterization 09/10/2018:  Normal LVF   Normal Wall Motion   EF=60%   Conclusion  Normal Cath  Normal Cors  Normal LVF  Patient possibly has a small vessel disease microvascular disease  recommend conservative medical therapy    NM Myocardial Perfusion SPECT 05/24/2018: IMPRESSION: Borderline myocardial perfusion scan evidence of depressed left ventricular function ejection fraction somewhere between 25 - 35% this is less than adequate study with evidence of artifact and significant gut uptake there is no significant evidence of reversible ischemia would consider conservative medical therapy if symptoms are stable or proceed with invasive strategy if symptoms persist    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   Cataract    right eye but immature   Essential hypertension, benign    takes Lisinopril-HCTZ daily   GERD (gastroesophageal reflux disease)    Headache    sinus   History of colon polyps    benign   History of migraine    History of shingles    Hyperlipidemia    takes Lipitor daily   Low back pain    Nocturia    S/P insertion of  spinal cord stimulator    Seasonal allergies    takes Singulair daily as needed   Type II or unspecified type diabetes mellitus without mention of complication, not stated as uncontrolled    Vitamin D deficiency    takes Vit D weekly   Weakness    numbness and tingling left arm   Wears dentures    full upper and lower    Past Surgical History:  Procedure Laterality Date   ABDOMINAL HYSTERECTOMY     ANTERIOR CERVICAL DECOMP/DISCECTOMY FUSION N/A 02/26/2015   Procedure: ANTERIOR CERVICAL DISCECTOMY FUSION C4-5 (1 LEVEL);  Surgeon: Venita Lick, MD;  Location: [facility] OR;  Service: Orthopedics;  Laterality: N/A;   ANTERIOR LAT LUMBAR FUSION N/A 03/21/2013   Procedure:  ANTERIOR LATERAL LUMBAR FUSION 1 LEVEL/ XLIF L3-L4 ;  Surgeon: Venita Lick, MD;  [facility];  Service: Orthopedics;  Laterality: N/A;   APPENDECTOMY     AUGMENTATION MAMMAPLASTY Bilateral 1978   BACK SURGERY     BACK SURGERY     Lumbar fusion x 2   BREAST EXCISIONAL BIOPSY Right 1970   BREAST SURGERY  1990   Augementation   CARDIAC CATHETERIZATION  5/12   ef 55%   CARDIAC CATHETERIZATION  10/2010   [facility]; EF 55%   CARDIAC CATHETERIZATION Left 02/16/2016   Procedure: Left Heart Cath and Coronary Angiography;  Surgeon: Alwyn Pea, MD;  Location: [facility] INVASIVE CV LAB;  Service: Cardiovascular;  Laterality: Left;   COLONOSCOPY     COLONOSCOPY WITH PROPOFOL N/A 04/10/2017   Procedure: COLONOSCOPY WITH PROPOFOL;  Surgeon: Midge Minium, MD;  Location: [facility];  Service: Gastroenterology;  Laterality: N/A;  Diabetic - insulin   ESOPHAGOGASTRODUODENOSCOPY     JOINT REPLACEMENT     KIDNEY SURGERY  1998   growth removed from left kidney    LEFT HEART CATH AND CORONARY ANGIOGRAPHY Left 09/10/2018   Procedure: LEFT HEART CATH AND CORONARY ANGIOGRAPHY;  Surgeon: Alwyn Pea, MD;  Location: [facility] INVASIVE CV LAB;  Service: Cardiovascular;  Laterality: Left;   pain stimulator     POSTERIOR CERVICAL FUSION/FORAMINOTOMY Right 03/21/2013   Procedure: POSTERIOR L2-3 RIGHT FORAMINOTOMY;  Surgeon: Venita Lick, MD;  [facility];  Service: Orthopedics;  Laterality: Right;   REVISION OF SCAR TISSUE RECTUS MUSCLE     SHOULDER ARTHROSCOPY WITH ROTATOR CUFF REPAIR AND SUBACROMIAL DECOMPRESSION Right 09/17/2020   Procedure: RIGHT SHOULDER ARTHROSCOPY WITH MINI-OPEN ROTATOR CUFF REPAIR, DISTAL CLAVICLE EXCISION, AND SUBACROMIAL DECOMPRESSION WITH BICEP TENDONESIS;  Surgeon: Juanell Fairly, MD;  Location: [facility] ORS;  Service: Orthopedics;  Laterality: Right;   SMALL BOWEL REPAIR     SPINAL CORD STIMULATOR BATTERY EXCHANGE N/A 10/17/2012   Procedure: SPINAL CORD STIMULATOR BATTERY  REMOVAL;  Surgeon: Venita Lick, MD;  Location[facility];  Service: Orthopedics;  Laterality: N/A;   SPINAL CORD STIMULATOR BATTERY EXCHANGE N/A 07/22/2015   Procedure: REIMPLANTATION OF SPINAL CORD STIMULATOR BATTERY ;  Surgeon: Venita Lick, MD;  Location: [facility];  Service: Orthopedics;  Laterality: N/A;   TOTAL HIP ARTHROPLASTY Left 06/08/2022   Procedure: TOTAL HIP ARTHROPLASTY ANTERIOR APPROACH;  Surgeon: Ollen Gross, MD;  Location: [facility] ORS;  Service: Orthopedics;  Laterality: Left;   TOTAL KNEE ARTHROPLASTY Left    TOTAL KNEE ARTHROPLASTY Right 07/28/2017   Procedure: RIGHT TOTAL KNEE ARTHROPLASTY;  Surgeon: Eugenia Mcalpine, MD;  Location: [facility] ORS;  Service: Orthopedics;  Laterality: Right;    MEDICATIONS:  albuterol (PROVENTIL) (2.5 MG/3ML) 0.083% nebulizer solution   albuterol (  VENTOLIN HFA) 108 (90 Base) MCG/ACT inhaler   aspirin EC 81 MG tablet   azelastine (ASTELIN) 0.1 % nasal spray   Budeson-Glycopyrrol-Formoterol (BREZTRI AEROSPHERE) 160-9-4.8 MCG/ACT AERO   budesonide-formoterol (SYMBICORT) 160-4.5 MCG/ACT inhaler   busPIRone (BUSPAR) 5 MG tablet   citalopram (CELEXA) 40 MG tablet   dapagliflozin propanediol (FARXIGA) 5 MG TABS tablet   docusate sodium (COLACE) 100 MG capsule   Dulaglutide (TRULICITY) 0.75 MG/0.5ML SOPN   esomeprazole (NEXIUM) 40 MG capsule   loratadine (CLARITIN) 10 MG tablet   losartan (COZAAR) 25 MG tablet   methocarbamol (ROBAXIN) 500 MG tablet   montelukast (SINGULAIR) 10 MG tablet   Oxycodone HCl 10 MG TABS   promethazine (PHENERGAN) 12.5 MG tablet   propranolol (INDERAL) 20 MG tablet   rosuvastatin (CRESTOR) 40 MG tablet   Ubrogepant (UBRELVY) 50 MG TABS   No current facility-administered medications for this encounter.    Shonna Chock, PA-C Surgical Short Stay/Anesthesiology [facility] Phone [phone] [facility] Phone [phone] 01/23/2023 7:51 PM

## 2023-01-30 ENCOUNTER — Inpatient Hospital Stay (HOSPITAL_COMMUNITY): Payer: Medicare HMO

## 2023-01-30 ENCOUNTER — Inpatient Hospital Stay (HOSPITAL_COMMUNITY)
Admission: RE | Admit: 2023-01-30 | Discharge: 2023-02-02 | DRG: 455 | Disposition: A | Discharge status: Home Health | Payer: Medicare HMO | Attending: Orthopedic Surgery | Admitting: Orthopedic Surgery

## 2023-01-30 ENCOUNTER — Other Ambulatory Visit: Payer: Self-pay

## 2023-01-30 ENCOUNTER — Inpatient Hospital Stay (HOSPITAL_COMMUNITY): Payer: Medicare HMO | Admitting: Anesthesiology

## 2023-01-30 ENCOUNTER — Inpatient Hospital Stay (HOSPITAL_COMMUNITY): Payer: Medicare HMO | Admitting: Vascular Surgery

## 2023-01-30 ENCOUNTER — Encounter (HOSPITAL_COMMUNITY): Payer: Self-pay | Admitting: Orthopedic Surgery

## 2023-01-30 ENCOUNTER — Encounter (HOSPITAL_COMMUNITY)
Admission: RE | Disposition: A | Discharge status: Home Health | Payer: Self-pay | Source: Home / Self Care | Attending: Orthopedic Surgery

## 2023-01-30 DIAGNOSIS — G8918 Other acute postprocedural pain: Secondary | ICD-10-CM | POA: Diagnosis not present

## 2023-01-30 DIAGNOSIS — Z8619 Personal history of other infectious and parasitic diseases: Secondary | ICD-10-CM | POA: Diagnosis not present

## 2023-01-30 DIAGNOSIS — J45909 Unspecified asthma, uncomplicated: Secondary | ICD-10-CM | POA: Diagnosis not present

## 2023-01-30 DIAGNOSIS — M5116 Intervertebral disc disorders with radiculopathy, lumbar region: Secondary | ICD-10-CM

## 2023-01-30 DIAGNOSIS — Z888 Allergy status to other drugs, medicaments and biological substances status: Secondary | ICD-10-CM | POA: Diagnosis not present

## 2023-01-30 DIAGNOSIS — Z79899 Other long term (current) drug therapy: Secondary | ICD-10-CM | POA: Diagnosis not present

## 2023-01-30 DIAGNOSIS — E119 Type 2 diabetes mellitus without complications: Secondary | ICD-10-CM | POA: Diagnosis not present

## 2023-01-30 DIAGNOSIS — F419 Anxiety disorder, unspecified: Secondary | ICD-10-CM | POA: Diagnosis not present

## 2023-01-30 DIAGNOSIS — E1129 Type 2 diabetes mellitus with other diabetic kidney complication: Secondary | ICD-10-CM

## 2023-01-30 DIAGNOSIS — M1990 Unspecified osteoarthritis, unspecified site: Secondary | ICD-10-CM | POA: Diagnosis present

## 2023-01-30 DIAGNOSIS — M48061 Spinal stenosis, lumbar region without neurogenic claudication: Secondary | ICD-10-CM

## 2023-01-30 DIAGNOSIS — K219 Gastro-esophageal reflux disease without esophagitis: Secondary | ICD-10-CM | POA: Diagnosis present

## 2023-01-30 DIAGNOSIS — M5416 Radiculopathy, lumbar region: Secondary | ICD-10-CM | POA: Diagnosis not present

## 2023-01-30 DIAGNOSIS — Z981 Arthrodesis status: Principal | ICD-10-CM

## 2023-01-30 DIAGNOSIS — Z9109 Other allergy status, other than to drugs and biological substances: Secondary | ICD-10-CM

## 2023-01-30 DIAGNOSIS — Z87891 Personal history of nicotine dependence: Secondary | ICD-10-CM

## 2023-01-30 DIAGNOSIS — M48062 Spinal stenosis, lumbar region with neurogenic claudication: Secondary | ICD-10-CM | POA: Diagnosis not present

## 2023-01-30 DIAGNOSIS — Z7985 Long-term (current) use of injectable non-insulin antidiabetic drugs: Secondary | ICD-10-CM | POA: Diagnosis not present

## 2023-01-30 DIAGNOSIS — Z79891 Long term (current) use of opiate analgesic: Secondary | ICD-10-CM

## 2023-01-30 DIAGNOSIS — I10 Essential (primary) hypertension: Secondary | ICD-10-CM | POA: Diagnosis present

## 2023-01-30 DIAGNOSIS — Z86010 Personal history of colonic polyps: Secondary | ICD-10-CM

## 2023-01-30 DIAGNOSIS — Z7951 Long term (current) use of inhaled steroids: Secondary | ICD-10-CM

## 2023-01-30 DIAGNOSIS — M549 Dorsalgia, unspecified: Secondary | ICD-10-CM | POA: Diagnosis present

## 2023-01-30 DIAGNOSIS — E559 Vitamin D deficiency, unspecified: Secondary | ICD-10-CM | POA: Diagnosis present

## 2023-01-30 DIAGNOSIS — Z7982 Long term (current) use of aspirin: Secondary | ICD-10-CM

## 2023-01-30 DIAGNOSIS — E785 Hyperlipidemia, unspecified: Secondary | ICD-10-CM | POA: Diagnosis present

## 2023-01-30 DIAGNOSIS — M5136 Other intervertebral disc degeneration, lumbar region: Secondary | ICD-10-CM | POA: Diagnosis not present

## 2023-01-30 HISTORY — PX: ANTERIOR LATERAL LUMBAR FUSION WITH PERCUTANEOUS SCREW 1 LEVEL: SHX5553

## 2023-01-30 LAB — GLUCOSE, CAPILLARY
Glucose-Capillary: 109 mg/dL — ABNORMAL HIGH (ref 70–99)
Glucose-Capillary: 215 mg/dL — ABNORMAL HIGH (ref 70–99)
Glucose-Capillary: 190 mg/dL — ABNORMAL HIGH (ref 70–99)
Glucose-Capillary: 157 mg/dL — ABNORMAL HIGH (ref 70–99)

## 2023-01-30 SURGERY — ANTERIOR LATERAL LUMBAR FUSION WITH PERCUTANEOUS SCREW 1 LEVEL
Anesthesia: General | Site: Spine Lumbar

## 2023-01-30 MED ORDER — FENTANYL CITRATE (PF) 250 MCG/5ML IJ SOLN
INTRAMUSCULAR | Status: DC | PRN
  Administered 2023-01-30 (×8): 50 ug via INTRAVENOUS

## 2023-01-30 MED ORDER — HYDROMORPHONE HCL 1 MG/ML IJ SOLN: 0.2500 mg | INTRAMUSCULAR | Status: DC | PRN

## 2023-01-30 MED ORDER — VANCOMYCIN HCL 500 MG IV SOLR
INTRAVENOUS | Status: AC
  Filled 2023-01-30: qty 10

## 2023-01-30 MED ORDER — CEFAZOLIN SODIUM-DEXTROSE 2-4 GM/100ML-% IV SOLN
INTRAVENOUS | Status: AC
  Filled 2023-01-30: qty 100

## 2023-01-30 MED ORDER — ACETAMINOPHEN 325 MG PO TABS
650.0000 mg | ORAL_TABLET | ORAL | Status: DC | PRN
  Administered 2023-02-01 – 2023-02-02 (×6): 650 mg via ORAL
  Filled 2023-01-30 (×6): qty 2

## 2023-01-30 MED ORDER — METHOCARBAMOL 1000 MG/10ML IJ SOLN: 500.0000 mg | Freq: Four times a day (QID) | INTRAVENOUS | Status: DC | PRN

## 2023-01-30 MED ORDER — HYDROMORPHONE HCL 1 MG/ML IJ SOLN: 1.0000 mg | INTRAMUSCULAR | Status: DC | PRN

## 2023-01-30 MED ORDER — DEXAMETHASONE SODIUM PHOSPHATE 10 MG/ML IJ SOLN
INTRAMUSCULAR | Status: AC
  Filled 2023-01-30: qty 1

## 2023-01-30 MED ORDER — PROPOFOL 10 MG/ML IV BOLUS
INTRAVENOUS | Status: AC
  Filled 2023-01-30: qty 20

## 2023-01-30 MED ORDER — BUPIVACAINE HCL (PF) 0.5 % IJ SOLN
INTRAMUSCULAR | Status: DC | PRN
  Administered 2023-01-30: 20 mL via PERINEURAL

## 2023-01-30 MED ORDER — EPHEDRINE 5 MG/ML INJ
INTRAVENOUS | Status: AC
  Filled 2023-01-30: qty 5

## 2023-01-30 MED ORDER — AZELASTINE HCL 0.1 % NA SOLN: 1.0000 | Freq: Two times a day (BID) | NASAL | Status: DC | PRN

## 2023-01-30 MED ORDER — MIDAZOLAM HCL 2 MG/2ML IJ SOLN
INTRAMUSCULAR | Status: AC
  Filled 2023-01-30: qty 2

## 2023-01-30 MED ORDER — 0.9 % SODIUM CHLORIDE (POUR BTL) OPTIME
TOPICAL | Status: DC | PRN
  Administered 2023-01-30 (×3): 1000 mL

## 2023-01-30 MED ORDER — CEFAZOLIN SODIUM-DEXTROSE 1-4 GM/50ML-% IV SOLN
1.0000 g | Freq: Three times a day (TID) | INTRAVENOUS | Status: AC
  Administered 2023-01-30 – 2023-01-31 (×2): 1 g via INTRAVENOUS
  Filled 2023-01-30 (×2): qty 50

## 2023-01-30 MED ORDER — ORAL CARE MOUTH RINSE: 15.0000 mL | Freq: Once | OROMUCOSAL | Status: AC

## 2023-01-30 MED ORDER — TRANEXAMIC ACID-NACL 1000-0.7 MG/100ML-% IV SOLN
1000.0000 mg | INTRAVENOUS | Status: AC
  Administered 2023-01-30: 1000 mg via INTRAVENOUS
  Filled 2023-01-30: qty 100

## 2023-01-30 MED ORDER — PROPOFOL 10 MG/ML IV BOLUS
INTRAVENOUS | Status: DC | PRN
  Administered 2023-01-30: 60 mg via INTRAVENOUS
  Administered 2023-01-30: 30 mg via INTRAVENOUS
  Administered 2023-01-30: 20 mg via INTRAVENOUS
  Administered 2023-01-30: 150 mg via INTRAVENOUS
  Administered 2023-01-30: 50 mg via INTRAVENOUS
  Administered 2023-01-30: 20 mg via INTRAVENOUS

## 2023-01-30 MED ORDER — BUPIVACAINE-EPINEPHRINE 0.25% -1:200000 IJ SOLN
INTRAMUSCULAR | Status: DC | PRN
  Administered 2023-01-30: 10 mL

## 2023-01-30 MED ORDER — BUPIVACAINE-EPINEPHRINE (PF) 0.25% -1:200000 IJ SOLN
INTRAMUSCULAR | Status: AC
  Filled 2023-01-30: qty 30

## 2023-01-30 MED ORDER — ONDANSETRON HCL 4 MG/2ML IJ SOLN
INTRAMUSCULAR | Status: DC | PRN
  Administered 2023-01-30: 4 mg via INTRAVENOUS

## 2023-01-30 MED ORDER — LIDOCAINE 2% (20 MG/ML) 5 ML SYRINGE
INTRAMUSCULAR | Status: DC | PRN
  Administered 2023-01-30: 80 mg via INTRAVENOUS

## 2023-01-30 MED ORDER — INSULIN ASPART 100 UNIT/ML IJ SOLN
0.0000 [IU] | Freq: Three times a day (TID) | INTRAMUSCULAR | Status: DC
  Administered 2023-01-31 (×2): 2 [IU] via SUBCUTANEOUS
  Administered 2023-01-31 – 2023-02-01 (×2): 3 [IU] via SUBCUTANEOUS
  Administered 2023-02-01 – 2023-02-02 (×2): 2 [IU] via SUBCUTANEOUS

## 2023-01-30 MED ORDER — OXYCODONE HCL 5 MG PO TABS
5.0000 mg | ORAL_TABLET | ORAL | Status: DC | PRN
  Administered 2023-01-30 (×2): 5 mg via ORAL
  Filled 2023-01-30 (×2): qty 1

## 2023-01-30 MED ORDER — LIDOCAINE 2% (20 MG/ML) 5 ML SYRINGE
INTRAMUSCULAR | Status: AC
  Filled 2023-01-30: qty 5

## 2023-01-30 MED ORDER — FENTANYL CITRATE (PF) 250 MCG/5ML IJ SOLN
INTRAMUSCULAR | Status: AC
  Filled 2023-01-30: qty 5

## 2023-01-30 MED ORDER — PHENOL 1.4 % MT LIQD: 1.0000 | OROMUCOSAL | Status: DC | PRN

## 2023-01-30 MED ORDER — ALBUTEROL SULFATE HFA 108 (90 BASE) MCG/ACT IN AERS: 2.0000 | INHALATION_SPRAY | Freq: Four times a day (QID) | RESPIRATORY_TRACT | Status: DC | PRN

## 2023-01-30 MED ORDER — ROSUVASTATIN CALCIUM 20 MG PO TABS
40.0000 mg | ORAL_TABLET | Freq: Every day | ORAL | Status: DC
  Administered 2023-01-30 – 2023-02-02 (×4): 40 mg via ORAL
  Filled 2023-01-30 (×4): qty 2

## 2023-01-30 MED ORDER — ONDANSETRON HCL 4 MG PO TABS: 4.0000 mg | ORAL_TABLET | Freq: Four times a day (QID) | ORAL | Status: DC | PRN

## 2023-01-30 MED ORDER — OXYCODONE HCL 5 MG/5ML PO SOLN: 5.0000 mg | Freq: Once | ORAL | Status: DC | PRN

## 2023-01-30 MED ORDER — LACTATED RINGERS IV SOLN: INTRAVENOUS | Status: DC

## 2023-01-30 MED ORDER — ONDANSETRON HCL 4 MG PO TABS: 4.0000 mg | ORAL_TABLET | Freq: Three times a day (TID) | ORAL | 0 refills | Status: DC | PRN

## 2023-01-30 MED ORDER — OXYCODONE-ACETAMINOPHEN 10-325 MG PO TABS: 1.0000 | ORAL_TABLET | Freq: Four times a day (QID) | ORAL | 0 refills | Status: AC | PRN

## 2023-01-30 MED ORDER — MIDAZOLAM HCL 2 MG/2ML IJ SOLN
INTRAMUSCULAR | Status: DC | PRN
  Administered 2023-01-30: 2 mg via INTRAVENOUS

## 2023-01-30 MED ORDER — AMISULPRIDE (ANTIEMETIC) 5 MG/2ML IV SOLN: 10.0000 mg | Freq: Once | INTRAVENOUS | Status: DC | PRN

## 2023-01-30 MED ORDER — CITALOPRAM HYDROBROMIDE 20 MG PO TABS
40.0000 mg | ORAL_TABLET | Freq: Every day | ORAL | Status: DC
  Administered 2023-01-31 – 2023-02-02 (×3): 40 mg via ORAL
  Filled 2023-01-30 (×3): qty 2

## 2023-01-30 MED ORDER — ACETAMINOPHEN 650 MG RE SUPP: 650.0000 mg | RECTAL | Status: DC | PRN

## 2023-01-30 MED ORDER — LABETALOL HCL 5 MG/ML IV SOLN
INTRAVENOUS | Status: DC | PRN
  Administered 2023-01-30 (×2): 10 mg via INTRAVENOUS

## 2023-01-30 MED ORDER — ALBUTEROL SULFATE (2.5 MG/3ML) 0.083% IN NEBU: 2.5000 mg | INHALATION_SOLUTION | Freq: Four times a day (QID) | RESPIRATORY_TRACT | Status: DC | PRN

## 2023-01-30 MED ORDER — DAPAGLIFLOZIN PROPANEDIOL 5 MG PO TABS
5.0000 mg | ORAL_TABLET | Freq: Every day | ORAL | Status: DC
  Administered 2023-01-31 – 2023-02-02 (×3): 5 mg via ORAL
  Filled 2023-01-30 (×3): qty 1

## 2023-01-30 MED ORDER — METHOCARBAMOL 500 MG PO TABS: 500.0000 mg | ORAL_TABLET | Freq: Three times a day (TID) | ORAL | 0 refills | Status: AC | PRN

## 2023-01-30 MED ORDER — BUPIVACAINE LIPOSOME 1.3 % IJ SUSP
INTRAMUSCULAR | Status: DC | PRN
  Administered 2023-01-30: 10 mL via PERINEURAL

## 2023-01-30 MED ORDER — INSULIN ASPART 100 UNIT/ML IJ SOLN: 0.0000 [IU] | INTRAMUSCULAR | Status: DC | PRN

## 2023-01-30 MED ORDER — OXYCODONE HCL 5 MG PO TABS: 5.0000 mg | ORAL_TABLET | Freq: Once | ORAL | Status: DC | PRN

## 2023-01-30 MED ORDER — SODIUM CHLORIDE 0.9% FLUSH: 3.0000 mL | Freq: Two times a day (BID) | INTRAVENOUS | Status: DC

## 2023-01-30 MED ORDER — PROPRANOLOL HCL 10 MG PO TABS
20.0000 mg | ORAL_TABLET | Freq: Three times a day (TID) | ORAL | Status: DC
  Administered 2023-01-30 – 2023-02-02 (×8): 20 mg via ORAL
  Filled 2023-01-30 (×8): qty 2

## 2023-01-30 MED ORDER — LOSARTAN POTASSIUM 50 MG PO TABS
25.0000 mg | ORAL_TABLET | Freq: Every day | ORAL | Status: DC
  Administered 2023-01-30 – 2023-02-02 (×4): 25 mg via ORAL
  Filled 2023-01-30 (×4): qty 1

## 2023-01-30 MED ORDER — THROMBIN 20000 UNITS EX SOLR
CUTANEOUS | Status: AC
  Filled 2023-01-30: qty 20000

## 2023-01-30 MED ORDER — HYDRALAZINE HCL 20 MG/ML IJ SOLN
INTRAMUSCULAR | Status: DC | PRN
  Administered 2023-01-30: 5 mg via INTRAVENOUS
  Administered 2023-01-30: 10 mg via INTRAVENOUS
  Administered 2023-01-30: 5 mg via INTRAVENOUS

## 2023-01-30 MED ORDER — PHENYLEPHRINE 80 MCG/ML (10ML) SYRINGE FOR IV PUSH (FOR BLOOD PRESSURE SUPPORT)
PREFILLED_SYRINGE | INTRAVENOUS | Status: AC
  Filled 2023-01-30: qty 10

## 2023-01-30 MED ORDER — CHLORHEXIDINE GLUCONATE 0.12 % MT SOLN
OROMUCOSAL | Status: AC
  Administered 2023-01-30: 15 mL via OROMUCOSAL
  Filled 2023-01-30: qty 15

## 2023-01-30 MED ORDER — SUCCINYLCHOLINE CHLORIDE 200 MG/10ML IV SOSY
PREFILLED_SYRINGE | INTRAVENOUS | Status: DC | PRN
  Administered 2023-01-30: 120 mg via INTRAVENOUS

## 2023-01-30 MED ORDER — SODIUM CHLORIDE 0.9% FLUSH: 3.0000 mL | INTRAVENOUS | Status: DC | PRN

## 2023-01-30 MED ORDER — LACTATED RINGERS IV SOLN: INTRAVENOUS | Status: DC | PRN

## 2023-01-30 MED ORDER — PROMETHAZINE HCL 25 MG/ML IJ SOLN: 6.2500 mg | INTRAMUSCULAR | Status: DC | PRN

## 2023-01-30 MED ORDER — MAGNESIUM CITRATE PO SOLN: 1.0000 | Freq: Once | ORAL | Status: DC | PRN

## 2023-01-30 MED ORDER — UMECLIDINIUM BROMIDE 62.5 MCG/ACT IN AEPB
1.0000 | INHALATION_SPRAY | Freq: Every day | RESPIRATORY_TRACT | Status: DC
  Filled 2023-01-30: qty 7

## 2023-01-30 MED ORDER — BUDESON-GLYCOPYRROL-FORMOTEROL 160-9-4.8 MCG/ACT IN AERO: 2.0000 | INHALATION_SPRAY | Freq: Two times a day (BID) | RESPIRATORY_TRACT | Status: DC

## 2023-01-30 MED ORDER — THROMBIN 20000 UNITS EX SOLR
OROMUCOSAL | Status: DC | PRN
  Administered 2023-01-30: 20 mL via TOPICAL

## 2023-01-30 MED ORDER — OXYCODONE HCL 5 MG PO TABS
10.0000 mg | ORAL_TABLET | ORAL | Status: DC | PRN
  Administered 2023-01-31 (×2): 10 mg via ORAL
  Filled 2023-01-30 (×2): qty 2

## 2023-01-30 MED ORDER — ONDANSETRON HCL 4 MG/2ML IJ SOLN: 4.0000 mg | Freq: Four times a day (QID) | INTRAMUSCULAR | Status: DC | PRN

## 2023-01-30 MED ORDER — MENTHOL 3 MG MT LOZG: 1.0000 | LOZENGE | OROMUCOSAL | Status: DC | PRN

## 2023-01-30 MED ORDER — SURGIFLO WITH THROMBIN (HEMOSTATIC MATRIX KIT) OPTIME
TOPICAL | Status: DC | PRN
  Administered 2023-01-30 (×3): 1 via TOPICAL

## 2023-01-30 MED ORDER — PROPOFOL 500 MG/50ML IV EMUL
INTRAVENOUS | Status: DC | PRN
  Administered 2023-01-30: 125 ug/kg/min via INTRAVENOUS

## 2023-01-30 MED ORDER — CHLORHEXIDINE GLUCONATE 0.12 % MT SOLN: 15.0000 mL | Freq: Once | OROMUCOSAL | Status: AC

## 2023-01-30 MED ORDER — CEFAZOLIN SODIUM-DEXTROSE 2-4 GM/100ML-% IV SOLN
2.0000 g | INTRAVENOUS | Status: AC
  Administered 2023-01-30: 2 g via INTRAVENOUS

## 2023-01-30 MED ORDER — TRANEXAMIC ACID-NACL 1000-0.7 MG/100ML-% IV SOLN
INTRAVENOUS | Status: AC
  Filled 2023-01-30: qty 100

## 2023-01-30 MED ORDER — METHOCARBAMOL 500 MG PO TABS
500.0000 mg | ORAL_TABLET | Freq: Four times a day (QID) | ORAL | Status: DC | PRN
  Administered 2023-01-30: 500 mg via ORAL
  Filled 2023-01-30: qty 1

## 2023-01-30 MED ORDER — SODIUM CHLORIDE 0.9 % IV SOLN: 250.0000 mL | INTRAVENOUS | Status: DC

## 2023-01-30 MED ORDER — ONDANSETRON HCL 4 MG/2ML IJ SOLN
INTRAMUSCULAR | Status: AC
  Filled 2023-01-30: qty 2

## 2023-01-30 MED ORDER — VANCOMYCIN HCL 500 MG IV SOLR
INTRAVENOUS | Status: DC | PRN
  Administered 2023-01-30: 500 mg via TOPICAL

## 2023-01-30 MED ORDER — INSULIN ASPART 100 UNIT/ML IJ SOLN: 0.0000 [IU] | Freq: Every day | INTRAMUSCULAR | Status: DC

## 2023-01-30 MED ORDER — MOMETASONE FURO-FORMOTEROL FUM 200-5 MCG/ACT IN AERO
2.0000 | INHALATION_SPRAY | Freq: Two times a day (BID) | RESPIRATORY_TRACT | Status: DC
  Administered 2023-01-30 – 2023-02-01 (×2): 2 via RESPIRATORY_TRACT
  Filled 2023-01-30: qty 8.8

## 2023-01-30 MED ORDER — POLYETHYLENE GLYCOL 3350 17 G PO PACK
17.0000 g | PACK | Freq: Every day | ORAL | Status: DC | PRN
  Administered 2023-01-31 (×2): 17 g via ORAL
  Filled 2023-01-30 (×2): qty 1

## 2023-01-30 SURGICAL SUPPLY — 105 items
ADH SKN CLS APL DERMABOND .7 (GAUZE/BANDAGES/DRESSINGS)
AGENT HMST KT MTR STRL THRMB (HEMOSTASIS) ×3
APPLIER CLIP 11 MED OPEN (CLIP)
APR CLP MED 11 20 MLT OPN (CLIP)
BAG COUNTER SPONGE SURGICOUNT (BAG) ×1 IMPLANT
BAG SPNG CNTER NS LX DISP (BAG) ×1
BLADE CLIPPER SURG (BLADE) IMPLANT
BLADE SURG 10 STRL SS (BLADE) ×1 IMPLANT
BUR EGG ELITE 4.0 (BURR) IMPLANT
BUR MATCHSTICK NEURO 3.0 LAGG (BURR) IMPLANT
CAP RELINE MOD TULIP RMM (Cap) IMPLANT
CLIP APPLIE 11 MED OPEN (CLIP) IMPLANT
CLIP NEUROVISION LG (NEUROSURGERY SUPPLIES) IMPLANT
CLSR STERI-STRIP ANTIMIC 1/2X4 (GAUZE/BANDAGES/DRESSINGS) IMPLANT
CNTNR URN SCR LID CUP LEK RST (MISCELLANEOUS) IMPLANT
CONT SPEC 4OZ STRL OR WHT (MISCELLANEOUS) ×1
CORD BIPOLAR FORCEPS 12FT (ELECTRODE) ×1 IMPLANT
COUNTER NDL 20CT MAGNET RED (NEEDLE) IMPLANT
COVER SURGICAL LIGHT HANDLE (MISCELLANEOUS) ×1 IMPLANT
DERMABOND ADVANCED .7 DNX12 (GAUZE/BANDAGES/DRESSINGS) ×1 IMPLANT
DRAIN CHANNEL 15F RND FF W/TCR (WOUND CARE) IMPLANT
DRAPE C-ARM 42X72 X-RAY (DRAPES) ×1 IMPLANT
DRAPE C-ARMOR (DRAPES) IMPLANT
DRAPE INCISE IOBAN 66X45 STRL (DRAPES) IMPLANT
DRAPE LAPAROTOMY 100X72X124 (DRAPES) IMPLANT
DRAPE ORTHO SPLIT 77X108 STRL (DRAPES)
DRAPE POUCH INSTRU U-SHP 10X18 (DRAPES) ×1 IMPLANT
DRAPE SURG ORHT 6 SPLT 77X108 (DRAPES) ×1 IMPLANT
DRAPE U-SHAPE 47X51 STRL (DRAPES) ×2 IMPLANT
DRSG AQUACEL AG ADV 3.5X 6 (GAUZE/BANDAGES/DRESSINGS) ×1 IMPLANT
DRSG OPSITE POSTOP 3X4 (GAUZE/BANDAGES/DRESSINGS) IMPLANT
DRSG OPSITE POSTOP 4X6 (GAUZE/BANDAGES/DRESSINGS) IMPLANT
DRSG TEGADERM 2-3/8X2-3/4 SM (GAUZE/BANDAGES/DRESSINGS) ×1 IMPLANT
DURAPREP 26ML APPLICATOR (WOUND CARE) ×1 IMPLANT
ELECT BLADE 4.0 EZ CLEAN MEGAD (MISCELLANEOUS) ×1
ELECT CAUTERY BLADE 6.4 (BLADE) ×1 IMPLANT
ELECT PENCIL ROCKER SW 15FT (MISCELLANEOUS) ×1 IMPLANT
ELECT REM PT RETURN 9FT ADLT (ELECTROSURGICAL) ×2
ELECTRODE BLDE 4.0 EZ CLN MEGD (MISCELLANEOUS) ×1 IMPLANT
ELECTRODE REM PT RTRN 9FT ADLT (ELECTROSURGICAL) ×1 IMPLANT
GAUZE 4X4 16PLY ~~LOC~~+RFID DBL (SPONGE) ×1 IMPLANT
GAUZE SPONGE 2X2 8PLY NS (GAUZE/BANDAGES/DRESSINGS) ×1 IMPLANT
GLOVE BIO SURGEON STRL SZ 6.5 (GLOVE) ×1 IMPLANT
GLOVE BIOGEL PI IND STRL 6.5 (GLOVE) ×1 IMPLANT
GLOVE BIOGEL PI IND STRL 8.5 (GLOVE) ×1 IMPLANT
GLOVE SS BIOGEL STRL SZ 8.5 (GLOVE) ×1 IMPLANT
GOWN STRL REUS W/ TWL LRG LVL3 (GOWN DISPOSABLE) ×1 IMPLANT
GOWN STRL REUS W/TWL 2XL LVL3 (GOWN DISPOSABLE) ×2 IMPLANT
GOWN STRL REUS W/TWL LRG LVL3 (GOWN DISPOSABLE) ×1
KIT BASIN OR (CUSTOM PROCEDURE TRAY) ×1 IMPLANT
KIT DILATOR XLIF 5 (KITS) IMPLANT
KIT SURGICAL ACCESS MAXCESS 4 (KITS) IMPLANT
KIT TURNOVER KIT B (KITS) ×1 IMPLANT
MODULE EMG NDL SSEP NVM5 (NEUROSURGERY SUPPLIES) IMPLANT
MODULE EMG NEEDLE SSEP NVM5 (NEUROSURGERY SUPPLIES) ×1 IMPLANT
MODULE NVM5 NEXT GEN EMG (NEUROSURGERY SUPPLIES) IMPLANT
NDL 22X1.5 STRL (OR ONLY) (MISCELLANEOUS) ×1 IMPLANT
NDL I-PASS III (NEEDLE) IMPLANT
NDL SPNL 18GX3.5 QUINCKE PK (NEEDLE) ×1 IMPLANT
NEEDLE 22X1.5 STRL (OR ONLY) (MISCELLANEOUS) ×1 IMPLANT
NEEDLE I-PASS III (NEEDLE) IMPLANT
NEEDLE SPNL 18GX3.5 QUINCKE PK (NEEDLE) ×1 IMPLANT
NS IRRIG 1000ML POUR BTL (IV SOLUTION) ×1 IMPLANT
PACK LAMINECTOMY ORTHO (CUSTOM PROCEDURE TRAY) ×1 IMPLANT
PACK UNIVERSAL I (CUSTOM PROCEDURE TRAY) ×1 IMPLANT
PAD ARMBOARD 7.5X6 YLW CONV (MISCELLANEOUS) ×2 IMPLANT
PROBE BALL TIP NVM5 SNG USE (NEUROSURGERY SUPPLIES) IMPLANT
PUTTY BONE DBX 2.5 MIS (Bone Implant) IMPLANT
PUTTY BONE DBX 5CC MIX (Putty) IMPLANT
ROD COCR RELINE LORD 5X85 (Rod) IMPLANT
ROD RELINE 0-0 CON M 5.0/6.0MM (Rod) IMPLANT
SCREW LOCK RELINE 5.5 TULIP (Screw) IMPLANT
SCREW LOCK RSS 4.5/5.0MM (Screw) IMPLANT
SCREW SHANK RELINE MOD 5.5X35 (Screw) IMPLANT
SPACER RISE 18X40MM 7-14MM (Spacer) IMPLANT
SPACER RISE 18X45MM 7-14MM (Spacer) IMPLANT
SPONGE INTESTINAL PEANUT (DISPOSABLE) ×2 IMPLANT
SPONGE SURGIFOAM ABS GEL 100 (HEMOSTASIS) IMPLANT
SPONGE T-LAP 4X18 ~~LOC~~+RFID (SPONGE) ×1 IMPLANT
STAPLER VISISTAT 35W (STAPLE) IMPLANT
STRIP CLOSURE SKIN 1/2X4 (GAUZE/BANDAGES/DRESSINGS) IMPLANT
SURGIFLO W/THROMBIN 8M KIT (HEMOSTASIS) IMPLANT
SUT BONE WAX W31G (SUTURE) ×1 IMPLANT
SUT MNCRL AB 3-0 PS2 27 (SUTURE) ×1 IMPLANT
SUT MNCRL+ AB 3-0 CT1 36 (SUTURE) IMPLANT
SUT PROLENE 5 0 C 1 24 (SUTURE) IMPLANT
SUT SILK 2 0 TIES 10X30 (SUTURE) ×1 IMPLANT
SUT SILK 3 0 TIES 10X30 (SUTURE) ×1 IMPLANT
SUT STRATAFIX 1PDS 45CM VIOLET (SUTURE) IMPLANT
SUT VIC AB 1 CT1 27 (SUTURE) ×1
SUT VIC AB 1 CT1 27XBRD ANBCTR (SUTURE) ×2 IMPLANT
SUT VIC AB 1 CTX 36 (SUTURE)
SUT VIC AB 1 CTX36XBRD ANBCTR (SUTURE) ×2 IMPLANT
SUT VIC AB 2-0 CT1 18 (SUTURE) ×1 IMPLANT
SYR BULB IRRIG 60ML STRL (SYRINGE) ×1 IMPLANT
SYR CONTROL 10ML LL (SYRINGE) ×1 IMPLANT
TAPE CLOTH 4X10 WHT NS (GAUZE/BANDAGES/DRESSINGS) ×2 IMPLANT
TIP CONICAL INSTAFILL (ORTHOPEDIC DISPOSABLE SUPPLIES) IMPLANT
TOWEL GREEN STERILE (TOWEL DISPOSABLE) ×1 IMPLANT
TOWEL GREEN STERILE FF (TOWEL DISPOSABLE) ×1 IMPLANT
TRAY FOLEY W/BAG SLVR 16FR (SET/KITS/TRAYS/PACK) ×1
TRAY FOLEY W/BAG SLVR 16FR ST (SET/KITS/TRAYS/PACK) ×1 IMPLANT
TUBE CONNECTING 12X1/4 (SUCTIONS) IMPLANT
WATER STERILE IRR 1000ML POUR (IV SOLUTION) ×1 IMPLANT
YANKAUER SUCT BULB TIP NO VENT (SUCTIONS) IMPLANT

## 2023-01-30 NOTE — OR Nursing (Signed)
No foreign objects seen on X-ray post lateral procedure per Dr. Shon Baton.

## 2023-01-30 NOTE — Anesthesia Procedure Notes (Signed)
Anesthesia Regional Block: TAP block   Pre-Anesthetic Checklist: , timeout performed,  Correct Patient, Correct Site, Correct Laterality,  Correct Procedure, Correct Position, site marked,  Risks and benefits discussed,  Surgical consent,  Pre-op evaluation,  At surgeon's request and post-op pain management  Laterality: Left  Prep: chloraprep       Needles:  Injection technique: Single-shot  Needle Type: Stimiplex     Needle Length: 9cm  Needle Gauge: 21     Additional Needles:   Procedures:,,,, ultrasound used (permanent image in chart),,    Narrative:  Start time: 01/30/2023 7:40 AM End time: 01/30/2023 7:45 AM Injection made incrementally with aspirations every 5 mL.  Performed by: Personally  Anesthesiologist: Lowella Curb, MD

## 2023-01-30 NOTE — H&P (Signed)
History: Lori Potts is a very pleasant 77 year old woman who has had a prior anterior posterior L3-S1 instrumented fusion.  Imaging studies demonstrate progressive right foraminal stenosis.  Degenerative disc disease at the adjacent L2-3 level.  Patient had a recent nerve block on the right side and had significant temporary improvement.  Based on the temporary improvement with the injection and the failure to improve with physical therapy and other conservative modalities we elected to move forward with surgery.  Surgical plan is to move forward with an L2-3 XLIF which will allow for indirect foraminal decompression followed by supplemental posterior fixation.  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   Cataract    right eye but immature   Essential hypertension, benign    takes Lisinopril-HCTZ daily   GERD (gastroesophageal reflux disease)    Headache    sinus   History of colon polyps    benign   History of migraine    History of shingles    Hyperlipidemia    takes Lipitor daily   Low back pain    Nocturia    S/P insertion of spinal cord stimulator    Seasonal allergies    takes Singulair daily as needed   Type II or unspecified type diabetes mellitus without mention of complication, not stated as uncontrolled    Vitamin D deficiency    takes Vit D weekly   Weakness    numbness and tingling left arm   Wears dentures    full upper and lower    Allergies  Allergen Reactions   Metformin And Related Diarrhea   Adhesive [Tape] Rash and Other (See Comments)    Regular tape is ok, allergy is to paper tape    No current facility-administered medications on file prior to encounter.   Current Outpatient Medications on File Prior to Encounter  Medication Sig Dispense Refill   albuterol (PROVENTIL) (2.5 MG/3ML) 0.083% nebulizer solution Take 3 mLs (2.5 mg total) by nebulization every 6 (six) hours as  needed for wheezing or shortness of breath. 150 mL 1   albuterol (VENTOLIN HFA) 108 (90 Base) MCG/ACT inhaler Inhale 2 puffs into the lungs every 6 (six) hours as needed for wheezing or shortness of breath. 18 g 2   aspirin EC 81 MG tablet Take 81 mg by mouth daily. Swallow whole.     azelastine (ASTELIN) 0.1 % nasal spray PLACE 1 SPRAY INTO BOTH NOSTRILS 2 (TWO) TIMES DAILY. USE IN EACH NOSTRIL AS DIRECTED (Patient taking differently: Place 1 spray into both nostrils 2 (two) times daily as needed for rhinitis or allergies. Use in each nostril as directed) 90 mL 1   Budeson-Glycopyrrol-Formoterol (BREZTRI AEROSPHERE) 160-9-4.8 MCG/ACT AERO Inhale 2 puffs into the lungs in the morning and at bedtime.     citalopram (CELEXA) 40 MG tablet Take 1 tablet (40 mg total) by mouth daily. 90 tablet 1   dapagliflozin propanediol (FARXIGA) 5 MG TABS tablet Take 1 tablet (5 mg total) by mouth daily before breakfast. 90 tablet 3   docusate sodium (COLACE) 100 MG capsule Take 100 mg by mouth 2 (two) times daily.     Dulaglutide (TRULICITY) 0.75 MG/0.5ML SOPN Inject 0.75 mg into the skin once a week. Patient receives via [employer] Patient Assistance through Dec 2023 (Patient taking differently: Inject 0.75 mg into the skin every Thursday. Patient receives via [employer]  Patient Assistance through Dec 2023)     esomeprazole (NEXIUM) 40 MG capsule Take 1 capsule (40 mg total) by mouth at bedtime. 90 capsule 1   losartan (COZAAR) 25 MG tablet Take 1 tablet (25 mg total) by mouth daily. 90 tablet 1   Oxycodone HCl 10 MG TABS 10 mg 5 (five) times daily.     propranolol (INDERAL) 20 MG tablet Take 1 tablet (20 mg total) by mouth 3 (three) times daily. 270 tablet 1   rosuvastatin (CRESTOR) 40 MG tablet Take 1 tablet (40 mg total) by mouth daily. 90 tablet 1   budesonide-formoterol (SYMBICORT) 160-4.5 MCG/ACT inhaler Inhale 2 puffs into the lungs 2 (two) times daily. Patient receives via [employer] Patient Assistance. (Patient  not taking: Reported on 01/18/2023) 1 each 5   busPIRone (BUSPAR) 5 MG tablet Take 1 tablet (5 mg total) by mouth 3 (three) times daily as needed. (Patient not taking: Reported on 01/18/2023) 270 tablet 1   loratadine (CLARITIN) 10 MG tablet Take 1 tablet (10 mg total) by mouth daily. (Patient not taking: Reported on 01/18/2023) 30 tablet 3   methocarbamol (ROBAXIN) 500 MG tablet Take 1 tablet (500 mg total) by mouth every 6 (six) hours as needed for muscle spasms. (Patient not taking: Reported on 01/18/2023) 40 tablet 0   montelukast (SINGULAIR) 10 MG tablet TAKE 1 TABLET BY MOUTH EVERYDAY AT BEDTIME 90 tablet 3   promethazine (PHENERGAN) 12.5 MG tablet Take 1 tablet (12.5 mg total) by mouth every 8 (eight) hours as needed for nausea or vomiting. 30 tablet 0   Ubrogepant (UBRELVY) 50 MG TABS Take 1 tablet (50 mg total) by mouth as needed (take one tablet at onset of migraine, may repeat dose after two hours). (Patient not taking: Reported on 01/18/2023) 30 tablet 0    Physical Exam: Vitals:   01/30/23 0600  BP: 125/61  Pulse: 64  Resp: 18  Temp: 97.9 F (36.6 C)  SpO2: 98%   Body mass index is 30.21 kg/m. Clinical exam: Jeryl is a pleasant individual, who appears younger than their stated age.  She is alert and orientated 3.  No shortness of breath, chest pain.  Abdomen is soft and non-tender, negative loss of bowel and bladder control, no rebound tenderness.  Negative: skin lesions abrasions contusions  Peripheral pulses: 2+ peripheral pulses bilaterally. LE compartments are: Soft and nontender.  Gait pattern: Altered gait pattern due to severe right lower lumbar pain radiating into the right paraspinal region.  Assistive devices: Cane  Neuro: 5/5 motor strength in the lower extremity. Positive numbness and dysesthesias with pain radiating into the L2 and 3 dermatome. Negative straight leg raise test. No clonus. Negative Babinski test  Musculoskeletal: Moderate right groin pain  with internal/external rotation and deep palpation. Significant back pain with attempts at range of motion and palpation. Pain radiates from the lumbar spine into the right lateral and anterior thigh. She does have pain and tenderness over the SI joint but this is more referred from the lumbar spine. Well-healed surgical scars from prior L3-S1 instrumented fusions.  X-rays of the lumbar spine demonstrate solid fusion L3-S1 with anterior interbody cages and posterior pedicle screw fixation. Positive adjacent segment degenerative disease at L2-3 with a slight degenerative adjacent scoliosis.  Patient has had a recent right intra-articular hip injection as well as an right L4 and L5 selective nerve root block with no improvement.  CT myelogram of the lumbar spine: Prior XLIF L3-4 with no hardware complications or residual stenosis.  Prior posterior decompression with intervertebral fusion L4-5 and L5-S1 with no signs of stenosis or nonunion. Mild disc space degeneration at L1/2 with moderate left foraminal stenosis with potential left L1 nerve root impingement. No central stenosis. Prior laminectomies at L2-3 with severe right-sided disc space narrowing contributing to foraminal and lateral recess stenosis. No significant central stenosis.  CT scan of the lumbar spine completed on 12/27/2022 demonstrates no change from her prior myelogram. Despite the increased pain and loss in function there is been no new structural damage.  Patient notes near complete relief of pain for 2 to 3 days following the right L1 and L2 selective nerve root block completed on 12/13/2022.  A/P: Summary: Taneka is a very pleasant 77 year old woman with severe debilitating back buttock and right anterior and medial thigh pain. Patient notes temporary significant improvement with her recent right L1 and L2 selective nerve root block. CT myelogram from 11/25/2022 demonstrates progressive right foraminal and lateral recess stenosis due to  adjacent segment degeneration at the L2-3 level. At this point I believe her primary pain generator is the L2-3 level. This has been confirmed with injection therapy and imaging studies. We have discussed surgical versus ongoing nonsurgical management. She states at this point given the severity of her pain she would like to move forward with surgery. I have gone over great detail the surgical procedure which would be a lateral interbody fusion at L2-3 followed by supplemental posterior pedicle screw fixation. This would entail extending her current posterior pedicle screw construct superiorly to the L5 to level. Since she already has a lateral plate at Z6-1 I do not believe pedicle screw fixation at L3 will be required. I have gone over the risks, benefits, alternatives to surgery with the patient and her husband in great detail and all of their questions are been addressed. OLIF/XLIF risks, benefits of surgery were reviewed with the patient. These include: infection, bleeding, death, stroke, paralysis, ongoing or worse pain, need for additional surgery, injury to the lumbar plexus resulting in hip flexor weakness and difficulty walking without assistive devices. Adjacent segment degenerative disease, need for additional surgery including fusing other levels, leak of spinal fluid, Nonunion, hardware failure, breakage, or mal-position. Deep venous thrombosis (DVT) requiring additional treatment such as filter, and/or medications. Injury to abdominal contents, loss in bowel and bladder control. Risks and benefits of spinal fusion: Infection, bleeding, death, stroke, paralysis, ongoing or worse pain, need for additional surgery, nonunion, leak of spinal fluid, adjacent segment degeneration requiring additional fusion surgery, Injury to abdominal vessels that can require anterior surgery to stop bleeding. Malposition of the cage and/or pedicle screws that could require additional surgery. Loss of bowel and bladder  control. Postoperative hematoma causing neurologic compression that could require urgent or emergent re-operation.

## 2023-01-30 NOTE — Transfer of Care (Signed)
  Immediate Anesthesia Transfer of Care Note  Patient: Lori Potts  Procedure(s) Performed: LATERAL INTERBODY FUSION LUMBAR TWO TO THREE (Spine Lumbar) EXTENSION OF POSTERIOR FUSION TO LUMBAR TWO (Spine Lumbar)  Patient Location: PACU  Anesthesia Type:GA combined with regional for post-op pain  Level of Consciousness: drowsy and patient cooperative  Airway & Oxygen Therapy: Patient connected to face mask oxygen  Post-op Assessment: Report given to RN and Post -op Vital signs reviewed and stable  Post vital signs: Reviewed and stable  Last Vitals:  Vitals Value Taken Time  BP 137/55 01/30/23 1301  Temp    Pulse 76 01/30/23 1303  Resp 19 01/30/23 1303  SpO2 100 % 01/30/23 1303  Vitals shown include unvalidated device data.  Last Pain:  Vitals:   01/30/23 0607  TempSrc:   PainSc: 8          Complications: No notable events documented.

## 2023-01-30 NOTE — Anesthesia Postprocedure Evaluation (Signed)
Anesthesia Post Note  Patient: Lori Potts  Procedure(s) Performed: LATERAL INTERBODY FUSION LUMBAR TWO TO THREE (Spine Lumbar) EXTENSION OF POSTERIOR FUSION TO LUMBAR TWO (Spine Lumbar)     Patient location during evaluation: PACU Anesthesia Type: General Level of consciousness: awake and alert Pain management: pain level controlled Vital Signs Assessment: post-procedure vital signs reviewed and stable Respiratory status: spontaneous breathing, nonlabored ventilation and respiratory function stable Cardiovascular status: blood pressure returned to baseline and stable Postop Assessment: no apparent nausea or vomiting Anesthetic complications: no   No notable events documented.  Last Vitals:  Vitals:   01/30/23 1400 01/30/23 1415  BP: (!) 154/64 (!) 161/65  Pulse: 77 79  Resp: (!) 21 20  Temp:    SpO2: 93% 93%    Last Pain:  Vitals:   01/30/23 1400  TempSrc:   PainSc: Asleep   Pain Goal:    LLE Motor Response: Purposeful movement (01/30/23 1400)   RLE Motor Response: Purposeful movement (01/30/23 1400)          Lowella Curb

## 2023-01-30 NOTE — Anesthesia Procedure Notes (Signed)
Procedure Name: Intubation Date/Time: 01/30/2023 8:13 AM  Performed by: Orlin Hilding, CRNAPre-anesthesia Checklist: Patient identified, Emergency Drugs available, Suction available, Patient being monitored and Timeout performed Patient Re-evaluated:Patient Re-evaluated prior to induction Oxygen Delivery Method: Circle system utilized Preoxygenation: Pre-oxygenation with 100% oxygen Induction Type: IV induction Ventilation: Mask ventilation without difficulty and Oral airway inserted - appropriate to patient size Laryngoscope Size: Mac and 3 Grade View: Grade I Tube type: Oral Tube size: 7.0 mm Number of attempts: 1 Placement Confirmation: ETT inserted through vocal cords under direct vision, positive ETCO2 and breath sounds checked- equal and bilateral Secured at: 22 cm Tube secured with: Tape Dental Injury: Teeth and Oropharynx as per pre-operative assessment

## 2023-01-30 NOTE — Op Note (Signed)
OPERATIVE REPORT  DATE OF SURGERY: 01/30/2023  PATIENT NAME:  Lori Potts MRN: [id number] DOB: 02-18-1946  PCP: [name], [name]  PRE-OPERATIVE DIAGNOSIS: Adjacent segment degenerative disc disease L2-3 with foraminal stenosis and radiculopathy.  Prior L3-S1 anterior posterior fusion  POST-OPERATIVE DIAGNOSIS: Same  PROCEDURE:   1.  XLIF L2-3 2.  Posterior lateral arthrodesis L2-4 3.  Posterior pedicle screw fixation L2.  Extension of existing construct from L4-L2  SURGEON:  Venita Lick, MD  PHYSICIAN ASSISTANT: None  ANESTHESIA:   General  EBL: 200 ml   Complications: None  Implants:  1.  Expandable 7 x 18 x 40 mm globus intervertebral final height 9.5 mm 2.  NuVasive cortical pedicle screws.  5.5 x 35 mm length 3.  NuVasive rod to rod connectors.  85 mm length rod.  Monitoring: No adverse SSEP or free running EMG activity noted throughout the case.  Both pedicle screws were directly stimulated and there is no activity greater than 20 mA.  Graft: DBX mix  BRIEF HISTORY: Lori Potts is a 77 y.o. female who had a prior XLIF via right-sided approach at L3-4 with a lateral plate and prior to that and L4-5 to S1 anterior posterior fusion.  Over the last 5 years show she has been having progressive back buttock and right anterior neuropathic thigh pain.  Attempts of conservative management failed to provide any significant long-lasting improvement.  As result of the severity of her pain and loss of quality of life we elected to move forward with the surgery.  All appropriate risks, benefits, and alternatives to surgery were discussed with the patient and consent was obtained.  PROCEDURE DETAILS: Patient was brought into the operating room and was properly positioned on the operating room table.  After induction with general anesthesia and the patient was endotracheally intubated.  A timeout was taken to confirm all important data: including patient, procedure, and  the level. Teds, SCD's were applied.   A Foley was laced by the nurse and the representative placed all appropriate needles for intraoperative SSEP and EMG monitoring.  The patient was turned into the lateral decubitus position with left side up.  All bony prominences well-padded, axillary roll was placed, and the left arm was placed in a well arm holder.  The flank was then prepped and draped in a standard fashion.  Using fluoroscopy identified the anterior and posterior margins of the L2-3 disc space.  The incision site was infiltrated with quarter percent Marcaine with epinephrine.  I then incised and began to sharply dissect down to the fascia of the external oblique.  The second incision the size of my finger was made 1 fingerbreadth posterior to the lateral incision I bluntly dissected down to the fascia of the retroperitoneum.  Advanced into the retroperitoneum and then bluntly dissected to mobilize the soft tissue.  I then dissected through the remaining oblique muscle until I could advance the trocar using my finger down to the lateral aspect of the psoas muscle overlying the L2-3 disc space.  I stimulated this area to confirm I was not traumatizing the plexus then advanced through the psoas muscle to the lateral aspect of the disc space.  Once I confirmed I was at the appropriate level I secured my first trocar to the disc space at L2-3 with a guidepin.  I then circumferentially stimulated the first dilator confirming there was no trauma to the plexus.  I then sequentially dilated and each time stimulated in a circumferential  manner.  The retractor was then placed and secured to the table with the arm.  I then stimulated behind the posterior blade to confirm I was not compressing the plexus.  I then gently began to move my retractor posteriorly until it came to sit in the posterior third of the disc space.  Once I confirmed satisfactory position of the posterior plate I placed the trocar to lock it  directly into the disc space.  I then repositioned it so that I had excellent to of the disc space.  I then stimulated behind each of the blades to confirm no compression or injury to the plexus.  I then asked expanded at the anterior plate until I could see the entire lateral aspect of the disc space.  Annulotomy was performed and using pituitary rongeurs I remove the bulk of the disc material.  The Cobb elevator was then advanced along the endplate until I was at the contralateral side and I released the contralateral annulus.  This was done on both the L2 and L3 endplates.  I then used the box osteotome to remove the bulk of the disc material.  I then placed sequential trialing devices until I was able to easily place the 8 mm lordotic implant.  I removed this trial and then used the curettes to remove the remaining cartilaginous endplate.  Once I had an adequate discectomy I then obtained the expandable intervertebral cage.  Cage was packed with the allograft and gently inserted into the disc space.  Initially I placed a 45 mm length but I noted that this was too long and so I removed it and placed a 40 mm length cage.  Once the cage was properly seated I expanded to a final height of 9.5 mm.  This showed significant improvement from her preoperative collapse disc space height.  The foramen were also now indirectly decompressed after restoring foraminal height.  The cages then backfilled with additional DBX mix.   then irrigated the wound copiously with normal saline.  I removed the retractors and noted there was some bleeding and so this was directly coagulated with bipolar cautery.  Patient's blood pressure was elevated and there was some oozing which I ultimately controlled with Floseal.  After final irrigation I closed the fascia of the external oblique with a running #1 strata fix suture.  I irrigated again and closed superficial with interrupted 2-0 Vicryl sutures and a 3-0 Monocryl for the skin.  The  posterior wound was irrigated and closed in a layered fashion with interrupted #1 Vicryl suture, interrupted 2-0 Vicryl suture, and a 3-0 Monocryl.  Steri-Strips and dry dressings were applied.  Surgical drapes were removed and the patient was turned supine on the operating table.  Once the patient was supine the [facility] spine frame was brought into the surgical theater.  The patient was then turned prone directly onto the spine frame.  All bony prominences were well-padded.  Once we confirmed satisfactory positioning of the patient on the spine frame the neuromonitoring representative then repositioned all needles.  The back was then prepped and draped in a standard fashion.  Another timeout was taken confirming that we were still operating on the same patient performing the posterior fusion.  The prior incision was reincised and gently extended superiorly.  I sharply dissected down to the tip of the spinous process of L2.  I then dissected using Bovie and Cobb elevator along the spinous process until the lamina came into view.  I then  identified the L1/2 facet complex.  This was done bilaterally.  I then extended my decompression down inferiorly to expose the L2/3 facet complex.  I confirmed with fluoroscopy the location of the L2-3 facet as well as the L2 pedicle.  Using Bovie I remove the entire L2-3 facet capsule in order to facilitate the posterior fusion.  I then continued my dissection inferiorly until the L4 pedicle screw was visualized as was the rod.  This was done bilaterally.  High-speed bur was placed on the inferior medial portion of the L2 pedicle as seen on fluoroscopy.  I decorticated and then placed the pedicle awl.  I advanced the pedicle awl towards the superior lateral side of the pedicle.  I confirmed trajectory with fluoroscopy.  As I neared the lateral third of the pedicle on the AP view I switched to the lateral view to confirm that I was just at the posterior wall of the vertebral  body.  Once I confirmed that the appropriate trajectory, I advanced into the vertebral body. I then palpated the resulting hole with a ball-tipped feeler and then placed the tap.  I repalpated the hole to ensure that it was intact.  The 5.5 x 35 mm length cortical screw was then placed.  Both screws had excellent purchase.  I then directly stimulated both screws and there is no adverse free running EMG activity at greater than 20 mA.  A rod to rod connector was then placed on the existing pedicle screw rod construct inferior to the L4 pedicle screw head.  The wound was then copes irrigated with normal saline.  Using a high-speed bur I decorticated the L2-3 facet complex as well as the L2 transverse process.  The reamings were left to aid in the fusion and I supplemented it with DBX mix.  With the posterior lateral arthrodesis complete I move forward with completing the construct.    The rod was secured to the polyaxial head of the L2 cortical screw and to the rod to rod connector.  Locking caps then secured and then all 3 locking caps were tightened and torqued to manufacture standards.  Final x-rays were taken which demonstrated satisfactory positioning of the intervertebral cage and the posterior instrumentation.  Vancomycin powder was placed in the wound and I closed the deep fascia with a running #1 strata fix stitch.  I then irrigated and closed with interrupted 2-0 Vicryl sutures and 3-0 Monocryl for the skin.  Steri-Strips and dry dressing were applied and the patient was ultimately extubated transfer the PACU without incident.  The end of the case all needle sponge counts were correct.  Venita Lick, MD 01/30/2023 12:39 PM

## 2023-01-30 NOTE — Discharge Instructions (Signed)

## 2023-01-31 LAB — GLUCOSE, CAPILLARY
Glucose-Capillary: 171 mg/dL — ABNORMAL HIGH (ref 70–99)
Glucose-Capillary: 132 mg/dL — ABNORMAL HIGH (ref 70–99)
Glucose-Capillary: 149 mg/dL — ABNORMAL HIGH (ref 70–99)
Glucose-Capillary: 125 mg/dL — ABNORMAL HIGH (ref 70–99)

## 2023-01-31 MED ORDER — TRAMADOL HCL 50 MG PO TABS
50.0000 mg | ORAL_TABLET | Freq: Three times a day (TID) | ORAL | Status: DC | PRN
  Administered 2023-01-31: 50 mg via ORAL
  Filled 2023-01-31 (×2): qty 1

## 2023-01-31 NOTE — Evaluation (Signed)
Physical Therapy Evaluation Patient Details Name: Lori Potts MRN: [id number] DOB: 22-Feb-1946 Today's Date: 01/31/2023  History of Present Illness  Pt is a 77 y.o. woman s/p lateral interbody fusion L 2-3, extension of posterior fusion to L 2. (01/30/2023). PMH includes: anxiety, arthritis, DM2, spinal cord simulator, prior L3-S1 A/PLIF.   Clinical Impression  Upon evaluation, pt presented sidelying with HOB elevated alert and with family present. Daughter reported that pt was independent with functional mobility prior to surgery. Currently, pt presented with minor disorientation throughout the session; required verbal cues in order to redirect attention to task. Multimodal cues were provided with transfers and gait training; needed heavy cueing for rw management during gait and foot placement with stair navigation. Required min guard throughout the session for safety. Educated patient and daughter on precautions, exercise progression, brace schedule and car transfer. Anticipating discharge to home with home rehab in order to improve functional mobility and independence.        Recommendations for follow up therapy are one component of a multi-disciplinary discharge planning process, led by the attending physician.  Recommendations may be updated based on patient status, additional functional criteria and insurance authorization.  Follow Up Recommendations       Assistance Recommended at Discharge Intermittent Supervision/Assistance  Patient can return home with the following  A little help with walking and/or transfers;A little help with bathing/dressing/bathroom;Assist for transportation;Help with stairs or ramp for entrance    Equipment Recommendations    Recommendations for Other Services       Functional Status Assessment Patient has had a recent decline in their functional status and demonstrates the ability to make significant improvements in function in a reasonable and  predictable amount of time.     Precautions / Restrictions Precautions Precautions: Fall;Back Precaution Booklet Issued: Yes (comment) Precaution Comments: Educated and reviewed with pt and daughter. Required Braces or Orthoses: Spinal Brace Spinal Brace: Lumbar corset;Applied in sitting position Restrictions Weight Bearing Restrictions: No      Mobility  Bed Mobility Overal bed mobility: Needs Assistance Bed Mobility: Sidelying to Sit   Sidelying to sit: Min guard (Rail provided to simulate home environment)       General bed mobility comments: Cued for LE placement and handrail assist. Able to push off with left UE and progress to sitting EOB with increased time. Bed rail provided in order to simulate home environment.    Transfers Overall transfer level: Needs assistance Equipment used: Rolling walker (2 wheels) Transfers: Sit to/from Stand Sit to Stand: Min assist           General transfer comment: Min assist for power up; multi modal cues provided for foot placement and hand placment for power up. When sitting in chair, pt had decreased awareness for controlling descent, asked "what happened?" after rapid descent into chair.    Ambulation/Gait Ambulation/Gait assistance: Min guard Gait Distance (Feet): 120 Feet Assistive device: Rolling walker (2 wheels) Gait Pattern/deviations: Step-through pattern, Decreased step length - right, Decreased step length - left, Decreased stride length, Trunk flexed Gait velocity: Decreased Gait velocity interpretation: <1.31 ft/sec, indicative of household ambulator   General Gait Details: Able to ambulate without physical assistnace. Cued for proximity to RW and RW management when turning. Increased time with turning and verbal cues provided for direction.  Stairs Stairs: Yes Stairs assistance: Min guard Stair Management: Two rails, Alternating pattern, Step to pattern, Forwards Number of Stairs: 10 General stair comments:  Pt able to perform steps with min guard  for safety; multimodal cues provided for step to pattern with inconsistent return demonstration. Educated on ascending with stronger lower extremity.  Wheelchair Mobility    Modified Rankin (Stroke Patients Only)       Balance Overall balance assessment: Needs assistance Sitting-balance support: No upper extremity supported, Feet supported Sitting balance-Leahy Scale: Fair Sitting balance - Comments: Able to don brace and gown without UE support   Standing balance support: Bilateral upper extremity supported, During functional activity Standing balance-Leahy Scale: Fair Standing balance comment: Utilized RW for standing balance after sit to stand transfer.                             Pertinent Vitals/Pain Pain Assessment Pain Assessment: No/denies pain    Home Living Family/patient expects to be discharged to:: Private residence Living Arrangements: Spouse/significant other Available Help at Discharge: Family;Available 24 hours/day Type of Home: House Home Access: Stairs to enter Entrance Stairs-Rails: Doctor, general practice of Steps: 8   Home Layout: One level Home Equipment: Agricultural consultant (2 wheels);Cane - single point;BSC/3in1;Grab bars - tub/shower;Toilet riser;Shower seat      Prior Function Prior Level of Function : Independent/Modified Independent;Driving             Mobility Comments: no AD, started using cane/RW with increased pain ADLs Comments: independent ADLs, IADls and driving     Hand Dominance   Dominant Hand: Right    Extremity/Trunk Assessment   Upper Extremity Assessment Upper Extremity Assessment: Defer to OT evaluation    Lower Extremity Assessment Lower Extremity Assessment: Generalized weakness    Cervical / Trunk Assessment Cervical / Trunk Assessment: Back Surgery  Communication   Communication: No difficulties  Cognition Arousal/Alertness: Awake/alert Behavior  During Therapy: WFL for tasks assessed/performed Overall Cognitive Status: Impaired/Different from baseline Area of Impairment: Orientation, Attention, Memory, Following commands, Safety/judgement, Awareness, Problem solving                 Orientation Level: Disoriented to, Time, Person Current Attention Level: Focused Memory: Decreased recall of precautions, Decreased short-term memory Following Commands: Follows one step commands inconsistently, Follows one step commands with increased time Safety/Judgement: Decreased awareness of safety, Decreased awareness of deficits Awareness: Intellectual Problem Solving: Slow processing, Decreased initiation, Difficulty sequencing, Requires verbal cues, Requires tactile cues General Comments: Pt awake in beginning of the session, required verbal cues throughout session in order to redirect attention to task. Multiple multimodal cues for mobility, rw management and stair navigation.        General Comments General comments (skin integrity, edema, etc.): daughter at side and supportive    Exercises     Assessment/Plan    PT Assessment Patient needs continued PT services  PT Problem List Decreased strength;Decreased range of motion;Decreased activity tolerance;Decreased balance;Decreased mobility;Decreased coordination;Decreased cognition;Decreased knowledge of use of DME;Decreased safety awareness;Decreased knowledge of precautions       PT Treatment Interventions DME instruction;Gait training;Stair training;Functional mobility training;Therapeutic activities;Therapeutic exercise;Balance training;Neuromuscular re-education;Patient/family education    PT Goals (Current goals can be found in the Care Plan section)  Acute Rehab PT Goals Patient Stated Goal: Family reported for patient to return to prior level of function and return to home. PT Goal Formulation: With patient/family Time For Goal Achievement: 02/07/23 Potential to Achieve  Goals: Fair    Frequency Min 5X/week     Co-evaluation               AM-PAC PT "6 Clicks" Mobility  Outcome  Measure Help needed turning from your back to your side while in a flat bed without using bedrails?: A Little Help needed moving from lying on your back to sitting on the side of a flat bed without using bedrails?: A Little Help needed moving to and from a bed to a chair (including a wheelchair)?: A Little Help needed standing up from a chair using your arms (e.g., wheelchair or bedside chair)?: A Little Help needed to walk in hospital room?: A Little Help needed climbing 3-5 steps with a railing? : A Little 6 Click Score: 18    End of Session Equipment Utilized During Treatment: Gait belt;Back brace Activity Tolerance: Patient tolerated treatment well Patient left: in chair;with call bell/phone within reach;with family/visitor present Nurse Communication: Mobility status PT Visit Diagnosis: Muscle weakness (generalized) (M62.81);Difficulty in walking, not elsewhere classified (R26.2)    Time: 1331-1401 PT Time Calculation (min) (ACUTE ONLY): 30 min   Charges:   PT Evaluation $PT Eval Low Complexity: 1 Low PT Treatments $Gait Training: 8-22 mins        Christene Lye, SPT Acute Rehabilitation Services [phone] Secure chat preferred    Christene Lye 01/31/2023, 2:34 PM

## 2023-01-31 NOTE — Progress Notes (Signed)
    Subjective: Procedure(s) (LRB): LATERAL INTERBODY FUSION LUMBAR TWO TO THREE (N/A) EXTENSION OF POSTERIOR FUSION TO LUMBAR TWO (N/A) 1 Day Post-Op  Patient reports pain as 3 on 0-10 scale.  Reports decreased leg pain reports incisional back pain   Positive void Negative bowel movement Negative flatus Negative chest pain or shortness of breath  Objective: Vital signs in last 24 hours: Temp:  [97.6 F (36.4 C)-99.1 F (37.3 C)] 98.4 F (36.9 C) (06/11 0750) Pulse Rate:  [71-81] 71 (06/11 0750) Resp:  [16-21] 16 (06/11 0750) BP: (109-182)/(55-95) 136/72 (06/11 0750) SpO2:  [92 %-100 %] 99 % (06/11 0750)  Intake/Output from previous day: 06/10 0701 - 06/11 0700 In: 2200 [I.V.:2000; IV Piggyback:200] Out: 1425 [Urine:1225; Blood:200]  Labs: No results for input(s): "WBC", "RBC", "HCT", "PLT" in the last 72 hours. No results for input(s): "NA", "K", "CL", "CO2", "BUN", "CREATININE", "GLUCOSE", "CALCIUM" in the last 72 hours. No results for input(s): "LABPT", "INR" in the last 72 hours.  Physical Exam: Neurologically intact ABD soft Intact pulses distally Incision: dressing C/D/I and no drainage Compartment soft Body mass index is 30.21 kg/m.   Assessment/Plan: Patient stable  Continue mobilization with physical therapy Continue care  Chivon is doing well overall.  She still has some slight confusion as she is recovering from anesthesia.  She is alert and orientated x 3.  There is no facial asymmetry or altered speech.  Will try and limit her opioids today and monitor her progress. Mobilization today with PT/OT.  Will also work with stairs. Possible discharge Wednesday provided she is cleared for home discharge from PT/OT standpoint, as well as having positive bowel movement/flatus. Will continue to monitor her progress.  Venita Lick, MD [facility] [phone]

## 2023-01-31 NOTE — Evaluation (Signed)
Occupational Therapy Evaluation Patient Details Name: Lori Potts MRN: [id number] DOB: 08-23-1945 Today's Date: 01/31/2023   History of Present Illness Pt is a 77 y/o female presenting on 6/10 for same day lateral interbody fusion L 2-3, extension of posterior fusion to L 2. PMH includes: anxiety, arthritis, DM2, spinal cord simulator, prior L3-S1 A/PLIF.   Clinical Impression   Patient admitted for above and presents with problem list below.  Daughter presents and supportive, reports PTA patient independent with ADLs and mobility (recently using RW vs cane with increased pain).  Patient educated on brace wear schedule and back precautions for Adls and mobility but poor carryover. She is very lethargic and requires multimodal cueing to follow simple commands, attend to task or even complete task.  She is disoriented to self (birthday) and year, but is able to report correct month and place after multiple attempts (as she was perseverating on dates).  She demonstrated ability to complete bed mobility with mod-max assist, transfers using RW with min assist, functional mobility using RW with min assist, and ADLs with up to max to total assist.  She requires cueing to keep her eyes open throughout session, but would have moments of clarity- voicing the need to go to the bathroom on her own.  Based on performance today, believe patient will best benefit from continued OT services acutely and after dc at inpatient setting with <3hrs/day to optimize independence, safety with ADLs and mobility.  Pending progress and alertness, may progress to [facility] services.  Will follow acutely.        Recommendations for follow up therapy are one component of a multi-disciplinary discharge planning process, led by the attending physician.  Recommendations may be updated based on patient status, additional functional criteria and insurance authorization.   Assistance Recommended at Discharge Frequent or constant  Supervision/Assistance  Patient can return home with the following A little help with walking and/or transfers;A lot of help with bathing/dressing/bathroom;Assistance with cooking/housework;Direct supervision/assist for medications management;Assist for transportation;Direct supervision/assist for financial management;Help with stairs or ramp for entrance    Functional Status Assessment  Patient has had a recent decline in their functional status and demonstrates the ability to make significant improvements in function in a reasonable and predictable amount of time.  Equipment Recommendations  None recommended by OT    Recommendations for Other Services       Precautions / Restrictions Precautions Precautions: Fall;Back Precaution Booklet Issued: Yes (comment) Precaution Comments: reviewed with daughter Required Braces or Orthoses: Spinal Brace Spinal Brace: Lumbar corset;Applied in sitting position Restrictions Weight Bearing Restrictions: No      Mobility Bed Mobility Overal bed mobility: Needs Assistance Bed Mobility: Rolling, Sidelying to Sit, Sit to Sidelying Rolling: Mod assist Sidelying to sit: Max assist     Sit to sidelying: Max assist General bed mobility comments: cueing for technique and assist to roll and ascend to EOB    Transfers Overall transfer level: Needs assistance Equipment used: Rolling walker (2 wheels) Transfers: Sit to/from Stand Sit to Stand: Min assist           General transfer comment: min assist to initate and power up, pt unable to initate on her own at this time      Balance Overall balance assessment: Needs assistance Sitting-balance support: No upper extremity supported, Feet supported Sitting balance-Leahy Scale: Fair Sitting balance - Comments: statically   Standing balance support: Bilateral upper extremity supported, No upper extremity supported, During functional activity Standing balance-Leahy Scale: Fair Standing  balance  comment: relies on BUE support dyanmically                           ADL either performed or assessed with clinical judgement   ADL Overall ADL's : Needs assistance/impaired     Grooming: Minimal assistance;Standing           Upper Body Dressing : Sitting;Moderate assistance   Lower Body Dressing: Total assistance;Sit to/from stand   Toilet Transfer: Minimal assistance;Ambulation;Rolling walker (2 wheels)   Toileting- Clothing Manipulation and Hygiene: Maximal assistance;Sit to/from stand       Functional mobility during ADLs: Minimal assistance;Rolling walker (2 wheels);Cueing for safety General ADL Comments: pt limited by lethargy requiring max verbal cues     Vision   Vision Assessment?: No apparent visual deficits Additional Comments: cueing to keep eyes open during session     Perception     Praxis      Pertinent Vitals/Pain       Hand Dominance Right   Extremity/Trunk Assessment Upper Extremity Assessment Upper Extremity Assessment: Generalized weakness   Lower Extremity Assessment Lower Extremity Assessment: Defer to PT evaluation   Cervical / Trunk Assessment Cervical / Trunk Assessment: Back Surgery   Communication Communication Communication: No difficulties   Cognition Arousal/Alertness: Lethargic Behavior During Therapy: Flat affect Overall Cognitive Status: Impaired/Different from baseline Area of Impairment: Orientation, Attention, Memory, Following commands, Safety/judgement, Awareness, Problem solving                 Orientation Level: Disoriented to, Time, Person Current Attention Level: Focused Memory: Decreased recall of precautions, Decreased short-term memory Following Commands: Follows one step commands inconsistently, Follows one step commands with increased time Safety/Judgement: Decreased awareness of safety, Decreased awareness of deficits Awareness: Intellectual Problem Solving: Slow processing, Decreased  initiation, Difficulty sequencing, Requires verbal cues, Requires tactile cues General Comments: Pt very lethargic and asleep upon entry.  Pt awoke to name, but fades in and out during session and requires frequent redirection for simple tasks or completion of tasks.  She is able to state her name but not her birthday or the year.  She correctly identifies place and month.     General Comments  daughter at side and supportive    Exercises     Shoulder Instructions      Home Living Family/patient expects to be discharged to:: Private residence Living Arrangements: Spouse/significant other Available Help at Discharge: Family;Available 24 hours/day Type of Home: House Home Access: Stairs to enter [employer] of Steps: 8 Entrance Stairs-Rails: Right;Left Home Layout: One level     Bathroom Shower/Tub: Chief Strategy Officer: Standard     Home Equipment: Agricultural consultant (2 wheels);Cane - single point;BSC/3in1;Grab bars - tub/shower;Toilet riser;Shower seat          Prior Functioning/Environment Prior Level of Function : Independent/Modified Independent;Driving             Mobility Comments: no AD, started using cane/RW with increased pain ADLs Comments: independent ADLs, IADls and driving        OT Problem List: Decreased strength;Decreased activity tolerance;Impaired balance (sitting and/or standing);Pain;Decreased knowledge of precautions;Decreased knowledge of use of DME or AE;Decreased safety awareness;Decreased cognition      OT Treatment/Interventions: Self-care/ADL training;DME and/or AE instruction;Therapeutic activities;Cognitive remediation/compensation;Balance training;Patient/family education    OT Goals(Current goals can be found in the care plan section) Acute Rehab OT Goals Patient Stated Goal: none stated OT Goal Formulation: Patient unable to  participate in goal setting Time For Goal Achievement: 02/14/23 Potential to Achieve  Goals: Good  OT Frequency: Min 2X/week    Co-evaluation              AM-PAC OT "6 Clicks" Daily Activity     Outcome Measure Help from another person eating meals?: A Lot Help from another person taking care of personal grooming?: A Little Help from another person toileting, which includes using toliet, bedpan, or urinal?: A Lot Help from another person bathing (including washing, rinsing, drying)?: A Lot Help from another person to put on and taking off regular upper body clothing?: A Lot Help from another person to put on and taking off regular lower body clothing?: Total 6 Click Score: 12   End of Session Equipment Utilized During Treatment: Rolling walker (2 wheels);Back brace Nurse Communication: Mobility status  Activity Tolerance: Patient limited by lethargy Patient left: in bed;with call bell/phone within reach;with bed alarm set;with family/visitor present  OT Visit Diagnosis: Other abnormalities of gait and mobility (R26.89);Muscle weakness (generalized) (M62.81);Pain;Other symptoms and signs involving cognitive function Pain - part of body:  (back- incisional)                Time: 1610-9604 OT Time Calculation (min): 22 min Charges:  OT General Charges $OT Visit: 1 Visit OT Evaluation $OT Eval Moderate Complexity: 1 Mod  Barry Brunner, OT Acute Rehabilitation Services Office [phone]   Chancy Milroy 01/31/2023, 1:26 PM

## 2023-01-31 NOTE — Progress Notes (Signed)
OT Cancellation Note  Patient Details Name: Lori Potts MRN: [id number] DOB: 1946-01-03   Cancelled Treatment:    Reason Eval/Treat Not Completed: Fatigue/lethargy limiting ability to participate- will follow and see as able.   Barry Brunner, OT Acute Rehabilitation Services Office [phone]   Chancy Milroy 01/31/2023, 9:12 AM

## 2023-02-01 LAB — GLUCOSE, CAPILLARY
Glucose-Capillary: 145 mg/dL — ABNORMAL HIGH (ref 70–99)
Glucose-Capillary: 92 mg/dL (ref 70–99)
Glucose-Capillary: 151 mg/dL — ABNORMAL HIGH (ref 70–99)
Glucose-Capillary: 130 mg/dL — ABNORMAL HIGH (ref 70–99)

## 2023-02-01 NOTE — Progress Notes (Signed)
Physical Therapy Treatment Patient Details Name: Lori Potts MRN: [id number] DOB: January 24, 1946 Today's Date: 02/01/2023   History of Present Illness Pt is a 77 y.o. woman s/p lateral interbody fusion L 2-3, extension of posterior fusion to L 2. (01/30/2023). PMH includes: anxiety, arthritis, DM2, spinal cord simulator, prior L3-S1 A/PLIF.    PT Comments    Progressing towards goals per plan of care. Pt received in bed slightly lethargic with daughter present but oriented to place and situation. Reviewed donning lumbar corset with patient and family. Able to perform functional mobility and transfers with out physical assistance. Multimodal cues provided throughout session for patient safety. Pt presented with improvements with cognition and awareness today but demonstrated minor regression with functional mobility due to fatigue. Recommend next session to continue improving gait training and stairs. Will continue to progress as tolerated.   Recommendations for follow up therapy are one component of a multi-disciplinary discharge planning process, led by the attending physician.  Recommendations may be updated based on patient status, additional functional criteria and insurance authorization.  Follow Up Recommendations       Assistance Recommended at Discharge Intermittent Supervision/Assistance  Patient can return home with the following A little help with walking and/or transfers;A little help with bathing/dressing/bathroom;Assist for transportation;Help with stairs or ramp for entrance   Equipment Recommendations  None recommended by PT    Recommendations for Other Services       Precautions / Restrictions Precautions Precautions: Fall;Back Precaution Booklet Issued: Yes (comment) Precaution Comments: Reviewed back precations Required Braces or Orthoses: Spinal Brace Spinal Brace: Lumbar corset;Applied in sitting position Restrictions Weight Bearing Restrictions: No      Mobility  Bed Mobility Overal bed mobility: Needs Assistance Bed Mobility: Sidelying to Sit   Sidelying to sit: Min guard     Sit to sidelying: Min guard General bed mobility comments: Cued for hand placement when propping up to sitting EOB. demonstrated good abilty to scoot to middle of bed in sidelying position.    Transfers Overall transfer level: Needs assistance Equipment used: Rolling walker (2 wheels) Transfers: Sit to/from Stand Sit to Stand: Min guard           General transfer comment: Performed without physical assistance, cued for scooting to edge of bed.    Ambulation/Gait Ambulation/Gait assistance: Min guard Gait Distance (Feet): 80 Feet Assistive device: Rolling walker (2 wheels) Gait Pattern/deviations: Step-through pattern, Decreased step length - right, Decreased step length - left, Decreased stride length, Trunk flexed Gait velocity: Decreased Gait velocity interpretation: <1.31 ft/sec, indicative of household ambulator Pre-gait activities: Marching EOB; supervision assist, 10x each leg General Gait Details: Distance limited due to fatigue; performed without physical assistance. intermittent cues for upright stance, forward gaze and proximity to RW with poor return demonstration.   Stairs Stairs: Yes Stairs assistance: Min guard Stair Management: Two rails, Step to pattern, Forwards Number of Stairs: 4 General stair comments: Performed with min guard for safety; limited number of steps due to fatigue.   Wheelchair Mobility    Modified Rankin (Stroke Patients Only)       Balance Overall balance assessment: Needs assistance Sitting-balance support: No upper extremity supported, Feet supported Sitting balance-Leahy Scale: Fair Sitting balance - Comments: able to maintain sitting balance while seated on EOB   Standing balance support: Bilateral upper extremity supported, During functional activity Standing balance-Leahy Scale: Fair  Cognition Arousal/Alertness: Awake/alert Behavior During Therapy: WFL for tasks assessed/performed Overall Cognitive Status: Impaired/Different from baseline Area of Impairment: Attention, Memory, Following commands, Safety/judgement, Awareness, Problem solving                 Orientation Level: Disoriented to, Time Current Attention Level: Focused Memory: Decreased recall of precautions, Decreased short-term memory Following Commands: Follows one step commands inconsistently, Follows one step commands with increased time Safety/Judgement: Decreased awareness of safety, Decreased awareness of deficits Awareness: Intellectual Problem Solving: Slow processing, Decreased initiation, Difficulty sequencing, Requires verbal cues, Requires tactile cues General Comments: Pt lethargic in beginning of the session; turned on lights with multimodal cues in order to get patient oriented. Daughter present to encourage mobility.        Exercises General Exercises - Lower Extremity Hip Flexion/Marching: AROM, 20 reps, Seated, Both    General Comments General comments (skin integrity, edema, etc.): Daughter within room; motivational and supportive throughout session. Reviewed how to donn and doff lumbar brace.      Pertinent Vitals/Pain Pain Assessment Pain Assessment: No/denies pain    Home Living                          Prior Function            PT Goals (current goals can now be found in the care plan section) Acute Rehab PT Goals Patient Stated Goal: Family reported for patient to return to prior level of function and return to home. PT Goal Formulation: With patient/family Time For Goal Achievement: 02/07/23 Potential to Achieve Goals: Fair Progress towards PT goals: Progressing toward goals    Frequency    Min 5X/week      PT Plan Current plan remains appropriate    Co-evaluation              AM-PAC PT "6  Clicks" Mobility   Outcome Measure  Help needed turning from your back to your side while in a flat bed without using bedrails?: A Little Help needed moving from lying on your back to sitting on the side of a flat bed without using bedrails?: A Little Help needed moving to and from a bed to a chair (including a wheelchair)?: A Little Help needed standing up from a chair using your arms (e.g., wheelchair or bedside chair)?: A Little Help needed to walk in hospital room?: A Little Help needed climbing 3-5 steps with a railing? : A Little 6 Click Score: 18    End of Session Equipment Utilized During Treatment: Gait belt;Back brace Activity Tolerance: Patient limited by fatigue Patient left: in bed;with call bell/phone within reach;with family/visitor present Nurse Communication: Mobility status PT Visit Diagnosis: Muscle weakness (generalized) (M62.81);Difficulty in walking, not elsewhere classified (R26.2)     Time: 9629-5284 PT Time Calculation (min) (ACUTE ONLY): 18 min  Charges:  $Gait Training: 8-22 mins                     Christene Lye, SPT Acute Rehabilitation Services [phone] Secure chat preferred     Christene Lye 02/01/2023, 12:02 PM

## 2023-02-01 NOTE — Progress Notes (Signed)
Occupational Therapy Treatment Patient Details Name: Lori Potts MRN: [id number] DOB: 1946-01-31 Today's Date: 02/01/2023   History of present illness Pt is a 77 y.o. woman s/p lateral interbody fusion L 2-3, extension of posterior fusion to L 2. (01/30/2023). PMH includes: anxiety, arthritis, DM2, spinal cord simulator, prior L3-S1 A/PLIF.   OT comments  Patient alert and had completed breakfast upon entry. Patient demonstrated good understanding of log roll technique but was unable to recall back precautions and required frequent cues. Patient demonstrated gains with toilet transfer with daughter assisting with hygiene due to uncomfortable with female therapist assisting. Patient demonstrated gains with grooming standing at sink and dressing with AE. Patient educated on reacher, sock aide and dressing stick for LB dressing requiring mod verbal cues and mod/max assist with patient's daughter assisting with pulling up clothing. Discharge recommendations for HHOT to continue education on LB dressing and functional transfers.    Recommendations for follow up therapy are one component of a multi-disciplinary discharge planning process, led by the attending physician.  Recommendations may be updated based on patient status, additional functional criteria and insurance authorization.    Assistance Recommended at Discharge Frequent or constant Supervision/Assistance  Patient can return home with the following  A little help with walking and/or transfers;A lot of help with bathing/dressing/bathroom;Assistance with cooking/housework;Direct supervision/assist for medications management;Assist for transportation;Direct supervision/assist for financial management;Help with stairs or ramp for entrance   Equipment Recommendations  None recommended by OT (daughter states they will not need AE)    Recommendations for Other Services      Precautions / Restrictions Precautions Precautions:  Fall;Back Precaution Booklet Issued: Yes (comment) Precaution Comments: reviewed back precautions with patient and daughter, patient required cues to recall precautions Required Braces or Orthoses: Spinal Brace Spinal Brace: Lumbar corset;Applied in sitting position Restrictions Weight Bearing Restrictions: No       Mobility Bed Mobility Overal bed mobility: Needs Assistance Bed Mobility: Sidelying to Sit   Sidelying to sit: Min guard       General bed mobility comments: cues for technique and use of bed rail    Transfers Overall transfer level: Needs assistance Equipment used: Rolling walker (2 wheels) Transfers: Sit to/from Stand Sit to Stand: Min guard, Min assist           General transfer comment: min guard from low surfaces, min guard and cues for safety     Balance Overall balance assessment: Needs assistance Sitting-balance support: No upper extremity supported, Feet supported Sitting balance-Leahy Scale: Fair Sitting balance - Comments: able to maintain sitting balance while seated on EOB   Standing balance support: Single extremity supported, Bilateral upper extremity supported, During functional activity Standing balance-Leahy Scale: Fair Standing balance comment: single extremity support while performing grooming tasks stand at sink                           ADL either performed or assessed with clinical judgement   ADL Overall ADL's : Needs assistance/impaired     Grooming: Min guard;Wash/dry hands;Wash/dry face;Oral care;Standing           Upper Body Dressing : Minimal assistance;Sitting   Lower Body Dressing: Moderate assistance;With adaptive equipment;Maximal assistance;Sit to/from stand Lower Body Dressing Details (indicate cue type and reason): education on adaptive equipment use for LB dressing with cues and assistance throughout, patient's daughter assisted with pulling up clothing Toilet Transfer: Min guard;Cueing for  safety;Cueing for sequencing;Ambulation;Regular Toilet;Rolling walker (2 wheels);Grab bars Toilet  Transfer Details (indicate cue type and reason): cues for safety and back precutions Toileting- Clothing Manipulation and Hygiene: Maximal assistance;Sit to/from stand Toileting - Clothing Manipulation Details (indicate cue type and reason): daughter assisted patient       General ADL Comments: patient uncomfortable with female on certain self care tasks and was assisted by daughter    Extremity/Trunk Assessment              Vision       Perception     Praxis      Cognition Arousal/Alertness: Awake/alert Behavior During Therapy: WFL for tasks assessed/performed Overall Cognitive Status: Impaired/Different from baseline Area of Impairment: Orientation, Attention, Memory, Following commands, Safety/judgement, Awareness, Problem solving                 Orientation Level: Disoriented to, Time Current Attention Level: Focused Memory: Decreased recall of precautions, Decreased short-term memory Following Commands: Follows one step commands inconsistently, Follows one step commands with increased time Safety/Judgement: Decreased awareness of safety, Decreased awareness of deficits Awareness: Intellectual Problem Solving: Slow processing, Decreased initiation, Difficulty sequencing, Requires verbal cues, Requires tactile cues General Comments: attempted x3 to have patient recall back precautions with patient requiring cues on all attempts, daughter also educated. slow processing        Exercises      Shoulder Instructions       General Comments reviewed back precautions    Pertinent Vitals/ Pain       Pain Assessment Pain Assessment: Faces Faces Pain Scale: Hurts little more Pain Location: back Pain Descriptors / Indicators: Grimacing, Sore Pain Intervention(s): Monitored during session, Repositioned  Home Living                                           Prior Functioning/Environment              Frequency  Min 2X/week        Progress Toward Goals  OT Goals(current goals can now be found in the care plan section)  Progress towards OT goals: Progressing toward goals  Acute Rehab OT Goals Patient Stated Goal: go home OT Goal Formulation: With patient/family Time For Goal Achievement: 02/14/23 Potential to Achieve Goals: Good ADL Goals Pt Will Perform Grooming: with supervision;standing Pt Will Perform Lower Body Dressing: with supervision;with adaptive equipment;sit to/from stand Pt Will Transfer to Toilet: with supervision;ambulating Pt Will Perform Toileting - Clothing Manipulation and hygiene: with supervision;sit to/from stand Additional ADL Goal #1: Pt will maintain alertness and follow simple commands with 90% accuray and no more than minimal cueing require.d  Plan Discharge plan needs to be updated    Co-evaluation                 AM-PAC OT "6 Clicks" Daily Activity     Outcome Measure   Help from another person eating meals?: A Little Help from another person taking care of personal grooming?: A Little Help from another person toileting, which includes using toliet, bedpan, or urinal?: A Lot Help from another person bathing (including washing, rinsing, drying)?: A Lot Help from another person to put on and taking off regular upper body clothing?: A Lot Help from another person to put on and taking off regular lower body clothing?: A Lot 6 Click Score: 14    End of Session Equipment Utilized During Treatment: Rolling walker (2 wheels);Back brace  OT Visit Diagnosis: Other abnormalities of gait and mobility (R26.89);Muscle weakness (generalized) (M62.81);Pain;Other symptoms and signs involving cognitive function Pain - part of body:  (back)   Activity Tolerance Patient tolerated treatment well   Patient Left in chair;with call bell/phone within reach;with family/visitor present   Nurse  Communication Mobility status        Time: 1610-9604 OT Time Calculation (min): 35 min  Charges: OT General Charges $OT Visit: 1 Visit OT Treatments $Self Care/Home Management : 23-37 mins  Alfonse Flavors, OTA Acute Rehabilitation Services  Office [phone]   Dewain Penning 02/01/2023, 8:21 AM

## 2023-02-01 NOTE — Progress Notes (Signed)
    Subjective: Procedure(s) (LRB): LATERAL INTERBODY FUSION LUMBAR TWO TO THREE (N/A) EXTENSION OF POSTERIOR FUSION TO LUMBAR TWO (N/A) 2 Days Post-Op  Patient reports pain as 3 on 0-10 scale.  Reports decreased leg pain reports incisional back pain   Positive void Positive bowel movement Positive flatus Negative chest pain or shortness of breath  Objective: Vital signs in last 24 hours: Temp:  [98.1 F (36.7 C)-98.7 F (37.1 C)] 98.1 F (36.7 C) (06/12 0400) Pulse Rate:  [67-80] 74 (06/12 0400) Resp:  [16-18] 18 (06/12 0400) BP: (114-153)/(55-79) 145/70 (06/12 0400) SpO2:  [97 %-99 %] 99 % (06/12 0400)  Intake/Output from previous day: No intake/output data recorded.  Labs: No results for input(s): "WBC", "RBC", "HCT", "PLT" in the last 72 hours. No results for input(s): "NA", "K", "CL", "CO2", "BUN", "CREATININE", "GLUCOSE", "CALCIUM" in the last 72 hours. No results for input(s): "LABPT", "INR" in the last 72 hours.  Physical Exam: Neurologically intact ABD soft Intact pulses distally Dorsiflexion/Plantar flexion intact Incision: dressing C/D/I Compartment soft Body mass index is 30.21 kg/m.   Assessment/Plan: Patient stable  Continue mobilization with physical therapy Continue care  Patient is cognitively improved.   Ambulating with decreased pain Pain is controlled with tramadol.  Will continue to hold oxycodone for now Will have PT/OT re-eval today.  Plan on possible d/c to home with services if needed tomorrow  Venita Lick, MD [facility] [phone]

## 2023-02-01 NOTE — TOC Transition Note (Signed)
Transition of Care [facility]) - CM/SW Discharge Note   Patient Details  Name: Lori Potts MRN: [id number] Date of Birth: [birth year]  Transition of Care [facility]) CM/SW Contact:  Kermit Balo, RN Phone Number: 02/01/2023, 2:52 PM   Clinical Narrative:    Pt is from home with family.  Home health ordered. Pt has used [facility] home health in the past and would like to use them again. Lynette with [facility] accepted the referral. Information on the AVS. Pt has transportation home when discharged.   Final next level of care: Home w Home Health Services Barriers to Discharge: No Barriers Identified   Patient Goals and CMS Choice CMS Medicare.gov Compare Post Acute Care list provided to:: Patient Choice offered to / list presented to : Patient, Adult Children  Discharge Placement                         Discharge Plan and Services Additional resources added to the After Visit Summary for                            [facility] Arranged: PT, OT [facility] Agency: [facility] Date [facility] Agency Contacted: 02/01/23   Representative spoke with at [facility] Agency: Haywood Lasso  Social Determinants of Health (SDOH) Interventions SDOH Screenings   Food Insecurity: No Food Insecurity (06/08/2022)  Housing: Low Risk  (06/08/2022)  Transportation Needs: No Transportation Needs (06/08/2022)  Utilities: Not At Risk (06/08/2022)  Alcohol Screen: Low Risk  (01/02/2023)  Depression (PHQ2-9): Low Risk  (01/02/2023)  Financial Resource Strain: Medium Risk (11/24/2021)  Physical Activity: Inactive (11/18/2021)  Social Connections: Socially Integrated (11/18/2021)  Stress: No Stress Concern Present (11/18/2021)  Tobacco Use: Medium Risk (01/30/2023)     Readmission Risk Interventions     No data to display

## 2023-02-02 LAB — GLUCOSE, CAPILLARY: Glucose-Capillary: 124 mg/dL — ABNORMAL HIGH (ref 70–99)

## 2023-02-02 NOTE — Plan of Care (Signed)
Pt doing well. Pt and daughter given D/C instructions with verbal understanding. Rx's were given to the Pt at D/C. Pt's incision is clean and dry with no sign of infection. Pt's IV was removed prior to D/C. Pt D/C'd home via wheelchair per MD order. Pt D/C'd home via wheelchair per MD order. Home Health was arranged by TOC prior to D/C. Pt is stable @ D/C and has no other needs at this time. Rema Fendt, RN

## 2023-02-02 NOTE — Discharge Summary (Signed)
Patient ID: Lori Potts MRN: [id number] DOB/AGE: 1945/11/07 77 y.o.  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   Cataract    right eye but immature   Essential hypertension, benign    takes Lisinopril-HCTZ daily   GERD (gastroesophageal reflux disease)    Headache    sinus   History of colon polyps    benign   History of migraine    History of shingles    Hyperlipidemia    takes Lipitor daily   Low back pain    Nocturia    S/P insertion of spinal cord stimulator    Seasonal allergies    takes Singulair daily as needed   Type II or unspecified type diabetes mellitus without mention of complication, not stated as uncontrolled    Vitamin D deficiency    takes Vit D weekly   Weakness    numbness and tingling left arm   Wears dentures    full upper and lower    Surgeries: Procedure(s): LATERAL INTERBODY FUSION LUMBAR TWO TO THREE EXTENSION OF POSTERIOR FUSION TO LUMBAR TWO on 01/30/2023   Consultants:   Discharged Condition: Improved  Hospital Course: Lori Potts is an 77 y.o. female who was admitted 01/30/2023 for operative treatment of S/P lumbar fusion. Patient failed conservative treatments (please see the history and physical for the specifics) and had severe unremitting pain that affects sleep, daily activities and work/hobbies. After pre-op clearance, the patient was taken to the operating room on 01/30/2023 and underwent  Procedure(s): LATERAL INTERBODY FUSION LUMBAR TWO TO THREE EXTENSION OF POSTERIOR FUSION TO LUMBAR TWO.    Patient was given perioperative antibiotics:   Anti-infectives (From admission, onward)    Start     Dose/Rate Route Frequency Ordered Stop   01/30/23 1645  ceFAZolin (ANCEF) IVPB 1 g/50 mL premix        1 g 100 mL/hr over 30 Minutes Intravenous Every 8 hours 01/30/23 1632 01/31/23 0047   01/30/23 1301  ceFAZolin (ANCEF) 2-4 GM/100ML-% IVPB  Status:  Discontinued       Note to Pharmacy: Lynetta Mare M: cabinet override      01/30/23 1301 01/30/23 1342   01/30/23 1214  vancomycin (VANCOCIN) powder  Status:  Discontinued          As needed 01/30/23 1214 01/30/23 1257   01/30/23 0553  ceFAZolin (ANCEF) 2-4 GM/100ML-% IVPB       Note to Pharmacy: Isabel Caprice, Destiny: cabinet override      01/30/23 0553 01/30/23 0840   01/30/23 0550  ceFAZolin (ANCEF) IVPB 2g/100 mL premix        2 g 200 mL/hr over 30 Minutes Intravenous 30 min pre-op 01/30/23 0550 01/30/23 0820        Patient was given sequential compression devices and early ambulation to prevent DVT.   Patient benefited maximally from hospital stay and there were no complications. At the time of discharge, the patient was urinating/moving their bowels without difficulty, tolerating a regular diet, pain is controlled with oral pain medications and they have been cleared by PT/OT.   Recent vital signs:  Patient Vitals for the past 24 hrs:  BP Temp Temp src Pulse Resp SpO2  02/02/23 0744 (!) 129/56 97.8 F (36.6 C) Oral 61 16 99 %  02/02/23 0353 131/70 98.1 F (36.7 C) Oral 65 18 98 %  02/01/23 2307 136/61 98.3 F (36.8 C) Oral (!) 59 17 98 %  02/01/23 2000 (!) 126/58 98.7 F (37.1 C) Oral 60 18 99 %  02/01/23 1618 (!) 120/53 97.8 F (36.6 C) Oral 70 16 97 %  02/01/23 1139 (!) 112/48 98 F (36.7 C) Oral 64 16 96 %     Recent laboratory studies: No results for input(s): "WBC", "HGB", "HCT", "PLT", "NA", "K", "CL", "CO2", "BUN", "CREATININE", "GLUCOSE", "INR", "CALCIUM" in the last 72 hours.  Invalid input(s): "PT", "2"   Discharge Medications:   Allergies as of 02/02/2023        Reactions   Metformin And Related Diarrhea   Adhesive [tape] Rash, Other (See Comments)   Regular tape is ok, allergy is to paper tape        Medication List     STOP taking these medications    aspirin EC 81 MG tablet   budesonide-formoterol 160-4.5 MCG/ACT inhaler Commonly known as: SYMBICORT   busPIRone 5 MG tablet Commonly known as: BUSPAR   loratadine 10 MG tablet Commonly known as: CLARITIN   Oxycodone HCl 10 MG Tabs   Ubrelvy 50 MG Tabs Generic drug: Ubrogepant       TAKE these medications    albuterol (2.5 MG/3ML) 0.083% nebulizer solution Commonly known as: PROVENTIL Take 3 mLs (2.5 mg total) by nebulization every 6 (six) hours as needed for wheezing or shortness of breath.   albuterol 108 (90 Base) MCG/ACT inhaler Commonly known as: VENTOLIN HFA Inhale 2 puffs into the lungs every 6 (six) hours as needed for wheezing or shortness of breath.   azelastine 0.1 % nasal spray Commonly known as: ASTELIN PLACE 1 SPRAY INTO BOTH NOSTRILS 2 (TWO) TIMES DAILY. USE IN EACH NOSTRIL AS DIRECTED What changed:  when to take this reasons to take this   Breztri Aerosphere 160-9-4.8 MCG/ACT Aero Generic drug: Budeson-Glycopyrrol-Formoterol Inhale 2 puffs into the lungs in the morning and at bedtime.   citalopram 40 MG tablet Commonly known as: CELEXA Take 1 tablet (40 mg total) by mouth daily.   dapagliflozin propanediol 5 MG Tabs tablet Commonly known as: FARXIGA Take 1 tablet (5 mg total) by mouth daily before breakfast.   docusate sodium 100 MG capsule Commonly known as: COLACE Take 100 mg by mouth 2 (two) times daily.   esomeprazole 40 MG capsule Commonly known as: NEXIUM Take 1 capsule (40 mg total) by mouth at bedtime.   losartan 25 MG tablet Commonly known as: COZAAR Take 1 tablet (25 mg total) by mouth daily.   methocarbamol 500 MG tablet Commonly known as: ROBAXIN Take 1 tablet (500 mg total) by mouth every 8 (eight) hours as needed  for up to 5 days for muscle spasms. What changed: when to take this   montelukast 10 MG tablet Commonly known as: SINGULAIR TAKE 1 TABLET BY MOUTH EVERYDAY AT BEDTIME   ondansetron 4 MG tablet Commonly known as: Zofran Take 1 tablet (4 mg total) by mouth every 8 (eight) hours as needed for nausea or vomiting.   oxyCODONE-acetaminophen 10-325 MG tablet Commonly known as: Percocet Take 1 tablet by mouth every 6 (six) hours as needed for up to 5 days for pain.   promethazine 12.5 MG tablet Commonly  known as: PHENERGAN Take 1 tablet (12.5 mg total) by mouth every 8 (eight) hours as needed for nausea or vomiting.   propranolol 20 MG tablet Commonly known as: INDERAL Take 1 tablet (20 mg total) by mouth 3 (three) times daily.   rosuvastatin 40 MG tablet Commonly known as: CRESTOR Take 1 tablet (40 mg total) by mouth daily.   Trulicity 0.75 MG/0.5ML Sopn Generic drug: Dulaglutide Inject 0.75 mg into the skin once a week. Patient receives via [employer] Patient Assistance through Dec 2023 What changed: when to take this        Diagnostic Studies: DG Lumbar Spine 2-3 Views  Result Date: 01/30/2023 CLINICAL DATA:  Elective surgery. EXAM: LUMBAR SPINE - 2-3 VIEW COMPARISON:  Lumbar CT 12/27/2022 FINDINGS: Five fluoroscopic spot views of the lumbar spine obtained in the operating room in frontal and lateral projections. Multilevel spinal fusion hardware is seen. Please reference operative report for details. Fluoroscopy time 3 minutes 14 seconds. Dose 116.27 mGy. IMPRESSION: Intraoperative fluoroscopy during lumbar surgery. Electronically Signed   By: Narda Rutherford M.D.   On: 01/30/2023 12:58   DG C-Arm 1-60 Min-No Report  Result Date: 01/30/2023 Fluoroscopy was utilized by the requesting physician.  No radiographic interpretation.   DG C-Arm 1-60 Min-No Report  Result Date: 01/30/2023 Fluoroscopy was utilized by the requesting physician.  No radiographic interpretation.   DG  C-Arm 1-60 Min-No Report  Result Date: 01/30/2023 Fluoroscopy was utilized by the requesting physician.  No radiographic interpretation.   DG C-Arm 1-60 Min-No Report  Result Date: 01/30/2023 Fluoroscopy was utilized by the requesting physician.  No radiographic interpretation.   DG C-Arm 1-60 Min-No Report  Result Date: 01/30/2023 Fluoroscopy was utilized by the requesting physician.  No radiographic interpretation.    Discharge Instructions     Incentive spirometry RT   Complete by: As directed         Follow-up Information     Venita Lick, MD. Schedule an appointment as soon as possible for a visit in 2 week(s).   Specialty: Orthopedic Surgery Why: If symptoms worsen, For suture removal, For wound re-check Contact information: [address] [place] [address] [phone]         [facility] Follow up.   Why: [phone] The home health agency will contact you for the first home visit                Discharge Plan:  discharge to home.  Disposition: Dakaria is a very pleasant 77 year old who has previous L3-S1 anterior posterior fusion who presented with progressive back buttock and right radicular leg pain.  Imaging demonstrated significant adjacent segment disease.  As result we elected to move forward with extending her fusion.  She failed to improve with conservative treatment.  Patient was taken to the operating room and had an XLIF at L2-3 and then extension of her existing pedicle screw rod construct to L2.  Surgical procedure went without complications.  Initially postoperatively the patient remained lethargic and sedated.  She was hemodynamically stable and neurologically intact but she took approximately 24 to 36 hours for her mental status to improve.  At the time of discharge she was alert and oriented x 3.  She was ambulating with minimal assistance, and she was voiding spontaneously.  Patient had positive flatus and  bowel movement without significant abdominal pain.  Incisions were clean dry and intact.  Her pain was well-controlled with tramadol.  Patient will be discharged home  with appropriate instructions.  Medications for pain, muscle spasms, and nausea were provided.  She will follow-up with me in 2 weeks for reevaluation.    Signed: Alvy Beal for Dr. Venita Lick [facility] [phone] 02/02/2023, 10:32 AM

## 2023-02-02 NOTE — Progress Notes (Signed)
Occupational Therapy Treatment Patient Details Name: Lori Potts MRN: [id number] DOB: [birth year] Today's Date: 02/02/2023   History of present illness Pt is a 77 y.o. woman s/p lateral interbody fusion L 2-3, extension of posterior fusion to L 2. (01/30/2023). PMH includes: anxiety, arthritis, DM2, spinal cord simulator, prior L3-S1 A/PLIF.   OT comments  Patient able to recall 2/3 back precautions with verbal cues. Patient required cues for log rolling to get to EOB. Discussed dressing and AE with patient and daughter. Daughter states they will not be using AE and that family will assist with dressing. Patient ambulated to walk in shower with raised lip to simulate tub transfer with min guard assist. Discussed with patient and daughter on shower safety, need for shower chair, benefits of grab bars and bath mat. Patient states they have already purchased a shower chair. Patient able to recall 3/3 back precautions at end of session. Acute OT to continue to follow.    Recommendations for follow up therapy are one component of a multi-disciplinary discharge planning process, led by the attending physician.  Recommendations may be updated based on patient status, additional functional criteria and insurance authorization.    Assistance Recommended at Discharge Frequent or constant Supervision/Assistance  Patient can return home with the following  A little help with walking and/or transfers;A lot of help with bathing/dressing/bathroom;Assistance with cooking/housework;Direct supervision/assist for medications management;Assist for transportation;Direct supervision/assist for financial management;Help with stairs or ramp for entrance   Equipment Recommendations  None recommended by OT (Patient states they have already purchased shower chair and will not purchase AE)    Recommendations for Other Services      Precautions / Restrictions Precautions Precautions: Fall;Back Precaution Booklet  Issued: Yes (comment) Precaution Comments: Reviewed back precations Required Braces or Orthoses: Spinal Brace Spinal Brace: Lumbar corset;Applied in sitting position Restrictions Weight Bearing Restrictions: No       Mobility Bed Mobility Overal bed mobility: Needs Assistance Bed Mobility: Sidelying to Sit, Sit to Sidelying   Sidelying to sit: Min guard     Sit to sidelying: Min guard General bed mobility comments: cues for log rolling and rail use    Transfers Overall transfer level: Needs assistance Equipment used: Rolling walker (2 wheels) Transfers: Sit to/from Stand Sit to Stand: Min guard           General transfer comment: min guard and cues for hand placement     Balance Overall balance assessment: Needs assistance Sitting-balance support: No upper extremity supported, Feet supported Sitting balance-Leahy Scale: Fair     Standing balance support: Bilateral upper extremity supported, During functional activity Standing balance-Leahy Scale: Fair                             ADL either performed or assessed with clinical judgement   ADL Overall ADL's : Needs assistance/impaired                         Toilet Transfer: Min guard;Cueing for safety;Rolling walker (2 wheels) Toilet Transfer Details (indicate cue type and reason): simulated     Tub/ Shower Transfer: Walk-in shower;Min guard;Educational psychologist (4 wheels) Web designer Details (indicate cue type and reason): performed shower transfer with stepping into shower to simulate tub transfer   General ADL Comments: education on AE. Patient and family state they do not plan to use AE for LB dressing and that family will assist. Discussed tub transfers,  grab bars, and  bath mat to increase safety    Extremity/Trunk Assessment              Vision       Perception     Praxis      Cognition Arousal/Alertness: Awake/alert Behavior During Therapy: WFL for tasks  assessed/performed Overall Cognitive Status: Impaired/Different from baseline Area of Impairment: Attention, Memory, Following commands, Safety/judgement, Awareness, Problem solving                 Orientation Level: Disoriented to, Time Current Attention Level: Focused Memory: Decreased recall of precautions, Decreased short-term memory Following Commands: Follows one step commands inconsistently, Follows one step commands with increased time Safety/Judgement: Decreased awareness of safety, Decreased awareness of deficits Awareness: Intellectual Problem Solving: Slow processing, Decreased initiation, Difficulty sequencing, Requires verbal cues, Requires tactile cues General Comments: able to recall 2/3 precautions at beginning of session and 3/3 at end of session.        Exercises      Shoulder Instructions       General Comments discussed with patient and daughter on home safety and AE use    Pertinent Vitals/ Pain       Pain Assessment Pain Assessment: Faces Faces Pain Scale: Hurts little more Pain Location: back Pain Descriptors / Indicators: Grimacing, Sore Pain Intervention(s): Limited activity within patient's tolerance, Monitored during session, Patient requesting pain meds-RN notified  Home Living                                          Prior Functioning/Environment              Frequency  Min 2X/week        Progress Toward Goals  OT Goals(current goals can now be found in the care plan section)  Progress towards OT goals: Progressing toward goals  Acute Rehab OT Goals Patient Stated Goal: go home OT Goal Formulation: With patient/family Time For Goal Achievement: 02/14/23 Potential to Achieve Goals: Good ADL Goals Pt Will Perform Grooming: with supervision;standing Pt Will Perform Lower Body Dressing: with supervision;with adaptive equipment;sit to/from stand Pt Will Transfer to Toilet: with supervision;ambulating Pt  Will Perform Toileting - Clothing Manipulation and hygiene: with supervision;sit to/from stand Additional ADL Goal #1: Pt will maintain alertness and follow simple commands with 90% accuray and no more than minimal cueing require.d  Plan Discharge plan needs to be updated    Co-evaluation                 AM-PAC OT "6 Clicks" Daily Activity     Outcome Measure   Help from another person eating meals?: A Little Help from another person taking care of personal grooming?: A Little Help from another person toileting, which includes using toliet, bedpan, or urinal?: A Little Help from another person bathing (including washing, rinsing, drying)?: A Lot Help from another person to put on and taking off regular upper body clothing?: A Little Help from another person to put on and taking off regular lower body clothing?: A Lot 6 Click Score: 16    End of Session Equipment Utilized During Treatment: Rolling walker (2 wheels);Back brace  OT Visit Diagnosis: Other abnormalities of gait and mobility (R26.89);Muscle weakness (generalized) (M62.81);Pain;Other symptoms and signs involving cognitive function Pain - part of body:  (back)   Activity Tolerance Patient tolerated treatment well   Patient  Left in bed;with call bell/phone within reach;with family/visitor present   Nurse Communication Mobility status;Patient requests pain meds        Time: 1610-9604 OT Time Calculation (min): 21 min  Charges: OT General Charges $OT Visit: 1 Visit OT Treatments $Self Care/Home Management : 8-22 mins  Alfonse Flavors, OTA Acute Rehabilitation Services  Office [phone]   Dewain Penning 02/02/2023, 8:18 AM

## 2023-02-02 NOTE — Progress Notes (Signed)
Physical Therapy Treatment Patient Details Name: Lori Potts MRN: [id number] DOB: 10/20/1945 Today's Date: 02/02/2023   History of Present Illness Pt is a 77 y.o. woman s/p lateral interbody fusion L 2-3, extension of posterior fusion to L 2. (01/30/2023). PMH includes: anxiety, arthritis, DM2, spinal cord simulator, prior L3-S1 A/PLIF.    PT Comments    Progressed towards goals today per plan of care. Patient and family endorse improvement with alertness and orientation; patient was able to recall all spinal precautions at the beginning and end of session. Improved ambulation distance today but patient reported legs still fatigued. Able to perform 4 steps stairs with improved mechanics; pt demonstrated alternating pattern with no reports of increased fatigue or pain. End of session included re-education with patient and family regarding donn/doff techniques and brace wear schedule. Anticipating discharge to home with rehab in order continue facilitating improvements in functional mobility.     Recommendations for follow up therapy are one component of a multi-disciplinary discharge planning process, led by the attending physician.  Recommendations may be updated based on patient status, additional functional criteria and insurance authorization.  Follow Up Recommendations       Assistance Recommended at Discharge Intermittent Supervision/Assistance  Patient can return home with the following A little help with walking and/or transfers;A little help with bathing/dressing/bathroom;Assist for transportation;Help with stairs or ramp for entrance   Equipment Recommendations  None recommended by PT    Recommendations for Other Services       Precautions / Restrictions Precautions Precautions: Fall;Back Precaution Booklet Issued: Yes (comment) Precaution Comments: Reviewed back precations Required Braces or Orthoses: Spinal Brace Spinal Brace: Lumbar corset;Applied in sitting  position Restrictions Weight Bearing Restrictions: No     Mobility  Bed Mobility Overal bed mobility: Needs Assistance Bed Mobility: Sidelying to Sit, Sit to Sidelying   Sidelying to sit: Min guard     Sit to sidelying: Min guard General bed mobility comments: Demonstrated technique without cues or physical assistance    Transfers Overall transfer level: Needs assistance Equipment used: Rolling walker (2 wheels) Transfers: Sit to/from Stand Sit to Stand: Supervision           General transfer comment: Cued for hand placement    Ambulation/Gait Ambulation/Gait assistance: Min guard Gait Distance (Feet): 100 Feet Assistive device: Rolling walker (2 wheels) Gait Pattern/deviations: Step-through pattern, Decreased step length - right, Decreased step length - left, Decreased stride length, Trunk flexed Gait velocity: Decreased Gait velocity interpretation: <1.31 ft/sec, indicative of household ambulator   General Gait Details: Progressed distance from last session but pt reported legs are still fatigued; provided one standing rest break. Cued for upright stance and forward gaze with good return demonstration.   Stairs Stairs: Yes Stairs assistance: Min guard Stair Management: Two rails, Forwards, Alternating pattern Number of Stairs: 4 General stair comments: Performed with min guard for safety; limited number of steps due to fatigue.   Wheelchair Mobility    Modified Rankin (Stroke Patients Only)       Balance Overall balance assessment: Needs assistance Sitting-balance support: No upper extremity supported, Feet supported Sitting balance-Leahy Scale: Fair Sitting balance - Comments: Maintained seated balance to donnand doff brace   Standing balance support: Bilateral upper extremity supported, During functional activity Standing balance-Leahy Scale: Fair                              Cognition Arousal/Alertness: Awake/alert Behavior During  Therapy: WFL for  tasks assessed/performed Overall Cognitive Status: Within Functional Limits for tasks assessed                                 General Comments: Patient able to recall 3/3 precaution at the beginning and end of session. Understood where she was at and able to recall how to place lumbar brace on with minimal cues.        Exercises      General Comments        Pertinent Vitals/Pain Pain Assessment Pain Assessment: 0-10 Pain Score: 8  Pain Location: Incision Pain Descriptors / Indicators: Sore, Guarding, Tender Pain Intervention(s): Monitored during session, Limited activity within patient's tolerance    Home Living                          Prior Function            PT Goals (current goals can now be found in the care plan section) Acute Rehab PT Goals Patient Stated Goal: Patient would like to return home PT Goal Formulation: With patient/family Time For Goal Achievement: 02/07/23 Potential to Achieve Goals: Fair Progress towards PT goals: Goals met/education completed, patient discharged from PT    Frequency    Min 5X/week      PT Plan Current plan remains appropriate    Co-evaluation              AM-PAC PT "6 Clicks" Mobility   Outcome Measure  Help needed turning from your back to your side while in a flat bed without using bedrails?: A Little Help needed moving from lying on your back to sitting on the side of a flat bed without using bedrails?: A Little Help needed moving to and from a bed to a chair (including a wheelchair)?: A Little Help needed standing up from a chair using your arms (e.g., wheelchair or bedside chair)?: A Little Help needed to walk in hospital room?: A Little Help needed climbing 3-5 steps with a railing? : A Little 6 Click Score: 18    End of Session Equipment Utilized During Treatment: Gait belt;Back brace Activity Tolerance: Patient limited by fatigue Patient left: in bed;with  call bell/phone within reach;with family/visitor present Nurse Communication: Mobility status PT Visit Diagnosis: Muscle weakness (generalized) (M62.81);Difficulty in walking, not elsewhere classified (R26.2)     Time: 1610-9604 PT Time Calculation (min) (ACUTE ONLY): 13 min  Charges:  $Gait Training: 8-22 mins                     Christene Lye, SPT Acute Rehabilitation Services [phone] Secure chat preferred     Christene Lye 02/02/2023, 12:03 PM

## 2023-02-03 ENCOUNTER — Telehealth: Payer: Self-pay | Admitting: *Deleted

## 2023-02-03 ENCOUNTER — Encounter (HOSPITAL_COMMUNITY): Payer: Self-pay | Admitting: Orthopedic Surgery

## 2023-02-03 ENCOUNTER — Ambulatory Visit: Payer: Self-pay

## 2023-02-03 NOTE — Chronic Care Management (AMB) (Signed)
   02/03/2023  Lori Potts [birth year] [id number]  Reason for Encounter: Patient no longer enrolled in the CCM program. CCM enrollment status updated    [facility]/Chronic Care Management [phone]

## 2023-02-03 NOTE — Transitions of Care (Post Inpatient/ED Visit) (Signed)
   02/03/2023  Name: Lori Potts MRN: [id number] DOB: [birth year]  Today's TOC FU Call Status: Today's TOC FU Call Status:: Unsuccessul Call (1st Attempt) Unsuccessful Call (1st Attempt) Date: 02/03/23  Attempted to reach the patient regarding the most recent Inpatient/ED visit.  Follow Up Plan: Additional outreach attempts will be made to reach the patient to complete the Transitions of Care (Post Inpatient/ED visit) call.   Gean Maidens BSN RN [facility] Care Management [phone]

## 2023-02-04 DIAGNOSIS — E119 Type 2 diabetes mellitus without complications: Secondary | ICD-10-CM | POA: Diagnosis not present

## 2023-02-04 DIAGNOSIS — J302 Other seasonal allergic rhinitis: Secondary | ICD-10-CM | POA: Diagnosis not present

## 2023-02-04 DIAGNOSIS — I700 Atherosclerosis of aorta: Secondary | ICD-10-CM | POA: Diagnosis not present

## 2023-02-04 DIAGNOSIS — J45909 Unspecified asthma, uncomplicated: Secondary | ICD-10-CM | POA: Diagnosis not present

## 2023-02-04 DIAGNOSIS — M48061 Spinal stenosis, lumbar region without neurogenic claudication: Secondary | ICD-10-CM | POA: Diagnosis not present

## 2023-02-04 DIAGNOSIS — M5136 Other intervertebral disc degeneration, lumbar region: Secondary | ICD-10-CM | POA: Diagnosis not present

## 2023-02-04 DIAGNOSIS — Z4789 Encounter for other orthopedic aftercare: Secondary | ICD-10-CM | POA: Diagnosis not present

## 2023-02-04 DIAGNOSIS — F419 Anxiety disorder, unspecified: Secondary | ICD-10-CM | POA: Diagnosis not present

## 2023-02-04 DIAGNOSIS — I10 Essential (primary) hypertension: Secondary | ICD-10-CM | POA: Diagnosis not present

## 2023-02-06 ENCOUNTER — Telehealth: Payer: Self-pay | Admitting: *Deleted

## 2023-02-06 ENCOUNTER — Telehealth: Payer: Self-pay | Admitting: Nurse Practitioner

## 2023-02-06 DIAGNOSIS — J45909 Unspecified asthma, uncomplicated: Secondary | ICD-10-CM | POA: Diagnosis not present

## 2023-02-06 DIAGNOSIS — M5136 Other intervertebral disc degeneration, lumbar region: Secondary | ICD-10-CM | POA: Diagnosis not present

## 2023-02-06 DIAGNOSIS — I700 Atherosclerosis of aorta: Secondary | ICD-10-CM | POA: Diagnosis not present

## 2023-02-06 DIAGNOSIS — F419 Anxiety disorder, unspecified: Secondary | ICD-10-CM | POA: Diagnosis not present

## 2023-02-06 DIAGNOSIS — I10 Essential (primary) hypertension: Secondary | ICD-10-CM | POA: Diagnosis not present

## 2023-02-06 DIAGNOSIS — J302 Other seasonal allergic rhinitis: Secondary | ICD-10-CM | POA: Diagnosis not present

## 2023-02-06 DIAGNOSIS — Z4789 Encounter for other orthopedic aftercare: Secondary | ICD-10-CM | POA: Diagnosis not present

## 2023-02-06 DIAGNOSIS — M48061 Spinal stenosis, lumbar region without neurogenic claudication: Secondary | ICD-10-CM | POA: Diagnosis not present

## 2023-02-06 DIAGNOSIS — E119 Type 2 diabetes mellitus without complications: Secondary | ICD-10-CM | POA: Diagnosis not present

## 2023-02-06 NOTE — Telephone Encounter (Signed)
Stephaine notified Raynelle Fanning will sign order,per Raynelle Fanning

## 2023-02-06 NOTE — Transitions of Care (Post Inpatient/ED Visit) (Signed)
02/06/2023  Name: Lori Potts MRN: [id number] DOB: April 25, 1946  Today's TOC FU Call Status: Today's TOC FU Call Status:: Successful TOC FU Call Competed TOC FU Call Complete Date: 02/06/23  Transition Care Management Follow-up Telephone Call Date of Discharge: 02/02/23 Discharge Facility: [facility]) Type of Discharge: Inpatient Admission Primary Inpatient Discharge Diagnosis:: S/P lumbar fusion How have you been since you were released from the hospital?: Better (I am better but I still hurt. RN asked is the pain medicine percocet not helping. Per patient she is not taking the percocet because her daughter didn't want her to she is taking tramadal which is not helping. She will let herr daughter know) Any questions or concerns?: Yes Patient Questions/Concerns:: pain not controlled. She will talk with her daughter and DR. Patient Questions/Concerns Addressed: Other:  Items Reviewed: Did you receive and understand the discharge instructions provided?: Yes Medications obtained,verified, and reconciled?: Yes (Medications Reviewed) Any new allergies since your discharge?: No Dietary orders reviewed?: No Do you have support at home?: Yes People in Home: spouse Name of Support/Comfort Primary Source: Chrissie Noa and daughter Silvio Pate  Medications Reviewed Today: Medications Reviewed Today     Reviewed by Luella Cook, RN (Case Manager) on 02/06/23 at 1610  Med List Status: <None>   Medication Order Taking? Sig Documenting Provider Last Dose Status Informant  albuterol (PROVENTIL) (2.5 MG/3ML) 0.083% nebulizer solution [id number] Yes Take 3 mLs (2.5 mg total) by nebulization every 6 (six) hours as needed for wheezing or shortness of breath. [name], [name] Taking Active Self  albuterol (VENTOLIN HFA) 108 (90 Base) MCG/ACT inhaler [id number] Yes Inhale 2 puffs into the lungs every 6 (six) hours as needed for wheezing or shortness of breath. [name], [name] Taking Active  Self  azelastine (ASTELIN) 0.1 % nasal spray [id number] Yes PLACE 1 SPRAY INTO BOTH NOSTRILS 2 (TWO) TIMES DAILY. USE IN EACH NOSTRIL AS DIRECTED  Patient taking differently: Place 1 spray into both nostrils 2 (two) times daily as needed for rhinitis or allergies. Use in each nostril as directed   Alba Cory, MD Taking Active Self  Budeson-Glycopyrrol-Formoterol (BREZTRI AEROSPHERE) 160-9-4.8 MCG/ACT AERO [id number] Yes Inhale 2 puffs into the lungs in the morning and at bedtime. [provider] Taking Active Self  citalopram (CELEXA) 40 MG tablet [id number] Yes Take 1 tablet (40 mg total) by mouth daily. [name], [name] Taking Active Self  dapagliflozin propanediol (FARXIGA) 5 MG TABS tablet [id number] Yes Take 1 tablet (5 mg total) by mouth daily before breakfast. [name], [name] Taking Active Self  docusate sodium (COLACE) 100 MG capsule [id number]  Take 100 mg by mouth 2 (two) times daily. [provider]  Active Self  Dulaglutide (TRULICITY) 0.75 MG/0.5ML SOPN [id number] Yes Inject 0.75 mg into the skin once a week. Patient receives via [employer] Patient Assistance through Dec 2023  Patient taking differently: Inject 0.75 mg into the skin every Thursday. Patient receives via [employer] Patient Assistance through Dec 2023   [name], [name] Taking Active Self  esomeprazole (NEXIUM) 40 MG capsule [id number] Yes Take 1 capsule (40 mg total) by mouth at bedtime. [name], [name] Taking Active Self  losartan (COZAAR) 25 MG tablet [id number] Yes Take 1 tablet (25 mg total) by mouth daily. [name], [name] Taking Active Self  montelukast (SINGULAIR) 10 MG tablet [id number]  TAKE 1 TABLET BY MOUTH EVERYDAY AT BEDTIME [name], [name]  Active Self  ondansetron [facility])  4 MG tablet [id number] Yes Take 1 tablet (4 mg total) by mouth every 8 (eight) hours as needed for nausea or vomiting. Venita Lick, MD Taking Active   promethazine (PHENERGAN) 12.5 MG tablet  [id number] Yes Take 1 tablet (12.5 mg total) by mouth every 8 (eight) hours as needed for nausea or vomiting. [name], [name] Taking Active Self  propranolol (INDERAL) 20 MG tablet [id number] Yes Take 1 tablet (20 mg total) by mouth 3 (three) times daily. [name], [name] Taking Active Self  rosuvastatin (CRESTOR) 40 MG tablet [id number] Yes Take 1 tablet (40 mg total) by mouth daily. [name], [name] Taking Active Self            Home Care and Equipment/Supplies: Were Home Health Services Ordered?: Yes Name of Home Health Agency:: [facility] Has Agency set up a time to come to your home?: Yes First Home Health Visit Date: 02/04/23 Any new equipment or medical supplies ordered?: NA  Functional Questionnaire: Do you need assistance with bathing/showering or dressing?: Yes Do you need assistance with meal preparation?: Yes Do you need assistance with eating?: No Do you have difficulty maintaining continence: No Do you need assistance with getting out of bed/getting out of a chair/moving?: No Do you have difficulty managing or taking your medications?: No  Follow up appointments reviewed: PCP Follow-up appointment confirmed?: NA Specialist Hospital Follow-up appointment confirmed?: Yes Date of Specialist follow-up appointment?: 02/15/23 Follow-Up Specialty Provider:: Venita Lick Do you need transportation to your follow-up appointment?: No Do you understand care options if your condition(s) worsen?: Yes-patient verbalized understanding  SDOH Interventions Today    Flowsheet Row Most Recent Value  SDOH Interventions   Food Insecurity Interventions Intervention Not Indicated  Housing Interventions Intervention Not Indicated  Transportation Interventions Intervention Not Indicated, Patient Resources (Friends/Family)      Interventions Today    Flowsheet Row Most Recent Value  General Interventions   General Interventions Discussed/Reviewed General Interventions  Discussed, General Interventions Reviewed, Doctor Visits  Pharmacy Interventions   Pharmacy Dicussed/Reviewed Pharmacy Topics Discussed      TOC Interventions Today    Flowsheet Row Most Recent Value  TOC Interventions   TOC Interventions Discussed/Reviewed TOC Interventions Discussed, TOC Interventions Reviewed        Gean Maidens BSN RN [facility] [phone]

## 2023-02-06 NOTE — Telephone Encounter (Signed)
Home Health Verbal Orders - Caller/Agency: Judeth Cornfield with [facility] Callback Number: [phone] (secure voice mail) Requesting : OT Frequency: 1X6

## 2023-02-08 DIAGNOSIS — Z4789 Encounter for other orthopedic aftercare: Secondary | ICD-10-CM | POA: Diagnosis not present

## 2023-02-08 DIAGNOSIS — I10 Essential (primary) hypertension: Secondary | ICD-10-CM | POA: Diagnosis not present

## 2023-02-08 DIAGNOSIS — M48061 Spinal stenosis, lumbar region without neurogenic claudication: Secondary | ICD-10-CM | POA: Diagnosis not present

## 2023-02-08 DIAGNOSIS — E119 Type 2 diabetes mellitus without complications: Secondary | ICD-10-CM | POA: Diagnosis not present

## 2023-02-08 DIAGNOSIS — J302 Other seasonal allergic rhinitis: Secondary | ICD-10-CM | POA: Diagnosis not present

## 2023-02-08 DIAGNOSIS — I700 Atherosclerosis of aorta: Secondary | ICD-10-CM | POA: Diagnosis not present

## 2023-02-08 DIAGNOSIS — J45909 Unspecified asthma, uncomplicated: Secondary | ICD-10-CM | POA: Diagnosis not present

## 2023-02-08 DIAGNOSIS — M5136 Other intervertebral disc degeneration, lumbar region: Secondary | ICD-10-CM | POA: Diagnosis not present

## 2023-02-08 DIAGNOSIS — F419 Anxiety disorder, unspecified: Secondary | ICD-10-CM | POA: Diagnosis not present

## 2023-02-09 ENCOUNTER — Telehealth: Payer: Self-pay | Admitting: *Deleted

## 2023-02-09 NOTE — Progress Notes (Signed)
  Care Coordination  Outreach Note  02/09/2023 Name: JAMIRA SELNER MRN: [id number] DOB: 25-Jun-1946   Care Coordination Outreach Attempts: An unsuccessful telephone outreach was attempted today to offer the patient information about available care coordination services.  Follow Up Plan:  Additional outreach attempts will be made to offer the patient care coordination information and services.   Encounter Outcome:  No Answer  Burman Nieves, CCMA Care Coordination Care Guide Direct Dial: [phone]

## 2023-02-09 NOTE — Progress Notes (Signed)
  Care Coordination   Note   02/09/2023 Name: [name] MRN: [id number] DOB: [birth year]  Lori Potts is a 77 y.o. year old female who sees [name], [name] for primary care. I reached out to Lori Potts by phone today to offer care coordination services.  Lori Potts was given information about Care Coordination services today including:   The Care Coordination services include support from the care team which includes your Nurse Coordinator, Clinical Social Worker, or Pharmacist.  The Care Coordination team is here to help remove barriers to the health concerns and goals most important to you. Care Coordination services are voluntary, and the patient may decline or stop services at any time by request to their care team member.   Care Coordination Consent Status: Patient agreed to services and verbal consent obtained.   Follow up plan:  Telephone appointment with care coordination team member scheduled for:  02/10/2023  Encounter Outcome:  Pt. Scheduled  Burman Nieves, CCMA Care Coordination Care Guide Direct Dial: [phone]

## 2023-02-10 ENCOUNTER — Telehealth: Payer: Self-pay | Admitting: *Deleted

## 2023-02-10 ENCOUNTER — Ambulatory Visit: Payer: Self-pay | Admitting: *Deleted

## 2023-02-10 DIAGNOSIS — M48061 Spinal stenosis, lumbar region without neurogenic claudication: Secondary | ICD-10-CM | POA: Diagnosis not present

## 2023-02-10 DIAGNOSIS — Z4789 Encounter for other orthopedic aftercare: Secondary | ICD-10-CM | POA: Diagnosis not present

## 2023-02-10 DIAGNOSIS — J302 Other seasonal allergic rhinitis: Secondary | ICD-10-CM | POA: Diagnosis not present

## 2023-02-10 DIAGNOSIS — F419 Anxiety disorder, unspecified: Secondary | ICD-10-CM | POA: Diagnosis not present

## 2023-02-10 DIAGNOSIS — E119 Type 2 diabetes mellitus without complications: Secondary | ICD-10-CM | POA: Diagnosis not present

## 2023-02-10 DIAGNOSIS — J45909 Unspecified asthma, uncomplicated: Secondary | ICD-10-CM | POA: Diagnosis not present

## 2023-02-10 DIAGNOSIS — I10 Essential (primary) hypertension: Secondary | ICD-10-CM | POA: Diagnosis not present

## 2023-02-10 DIAGNOSIS — M5136 Other intervertebral disc degeneration, lumbar region: Secondary | ICD-10-CM | POA: Diagnosis not present

## 2023-02-10 DIAGNOSIS — I700 Atherosclerosis of aorta: Secondary | ICD-10-CM | POA: Diagnosis not present

## 2023-02-10 NOTE — Patient Outreach (Signed)
  Care Coordination   02/10/2023 Name: GISELLA ALWINE MRN: [id number] DOB: [birth year]   Care Coordination Outreach Attempts:  An unsuccessful telephone outreach was attempted for a scheduled appointment today.  Follow Up Plan:  Additional outreach attempts will be made to offer the patient care coordination information and services.   Encounter Outcome:  Pt. Visit Completed   Care Coordination Interventions:  No, not indicated    Emit Kuenzel, LCSW Clinical Social Worker  [facility] Care Management [phone]

## 2023-02-10 NOTE — Patient Outreach (Signed)
  Care Coordination   Initial Visit Note   02/10/2023 Name: AKYAH LAGRANGE MRN: [id number] DOB: Jul 14, 1946  [name] is a 77 y.o. year old female who sees [name], [name] for primary care. I spoke with  [name] by phone today.  What matters to the patients health and wellness today?  Patient denies having any community resource needs at this time. Patient states that she did have a period of multiple losses, several friends passing away however states that she is feeling much better now. Patient denies need for mental health follow up at this time and confirms having a positive network of support.   Goals Addressed             This Visit's Progress    care manaagment activities       Care Coordination Interventions: SDOH screening completed-patient assessed for community resource needs          SDOH assessments and interventions completed:  Yes  SDOH Interventions Today    Flowsheet Row Most Recent Value  SDOH Interventions   Food Insecurity Interventions Intervention Not Indicated  Housing Interventions Intervention Not Indicated  Transportation Interventions Intervention Not Indicated  Utilities Interventions Intervention Not Indicated        Care Coordination Interventions:  Yes, provided   Interventions Today    Flowsheet Row Most Recent Value  Chronic Disease   Chronic disease during today's visit Other  [sadness]  General Interventions   General Interventions Discussed/Reviewed General Interventions Discussed  Mental Health Interventions   Mental Health Discussed/Reviewed Mental Health Discussed, Grief and Loss  [patient discussed recent loss of friends and family members where brief sadness followed, however states that she is doing well now, denies need for mental health follow up. Patient confirms having a postive support network.]  Advanced Directive Interventions   Advanced Directives Discussed/Reviewed Advanced Directives  Reviewed        Follow up plan: No further intervention required.   Encounter Outcome:  Pt. Visit Completed

## 2023-02-10 NOTE — Patient Instructions (Signed)
Visit Information  Thank you for taking time to visit with me today. Please don't hesitate to contact me if I can be of assistance to you.   Following are the goals we discussed today:  Please call this social worker with any community resource needs  If you are experiencing a Mental Health or Behavioral Health Crisis or need someone to talk to, please call the Suicide and Crisis Lifeline: 988   The patient verbalized understanding of instructions, educational materials, and care plan provided today and DECLINED offer to receive copy of patient instructions, educational materials, and care plan.   No further follow up required: patient to contact this Child psychotherapist with any additional community resource needs  [employer], LCSW Clinical Social Worker  [facility] Care Management [phone]

## 2023-02-15 DIAGNOSIS — J45909 Unspecified asthma, uncomplicated: Secondary | ICD-10-CM | POA: Diagnosis not present

## 2023-02-15 DIAGNOSIS — I10 Essential (primary) hypertension: Secondary | ICD-10-CM | POA: Diagnosis not present

## 2023-02-15 DIAGNOSIS — M48061 Spinal stenosis, lumbar region without neurogenic claudication: Secondary | ICD-10-CM | POA: Diagnosis not present

## 2023-02-15 DIAGNOSIS — M5136 Other intervertebral disc degeneration, lumbar region: Secondary | ICD-10-CM | POA: Diagnosis not present

## 2023-02-15 DIAGNOSIS — F419 Anxiety disorder, unspecified: Secondary | ICD-10-CM | POA: Diagnosis not present

## 2023-02-15 DIAGNOSIS — E119 Type 2 diabetes mellitus without complications: Secondary | ICD-10-CM | POA: Diagnosis not present

## 2023-02-15 DIAGNOSIS — J302 Other seasonal allergic rhinitis: Secondary | ICD-10-CM | POA: Diagnosis not present

## 2023-02-15 DIAGNOSIS — Z4789 Encounter for other orthopedic aftercare: Secondary | ICD-10-CM | POA: Diagnosis not present

## 2023-02-15 DIAGNOSIS — I700 Atherosclerosis of aorta: Secondary | ICD-10-CM | POA: Diagnosis not present

## 2023-02-16 DIAGNOSIS — M5136 Other intervertebral disc degeneration, lumbar region: Secondary | ICD-10-CM | POA: Diagnosis not present

## 2023-02-16 DIAGNOSIS — J302 Other seasonal allergic rhinitis: Secondary | ICD-10-CM | POA: Diagnosis not present

## 2023-02-16 DIAGNOSIS — Z4789 Encounter for other orthopedic aftercare: Secondary | ICD-10-CM | POA: Diagnosis not present

## 2023-02-16 DIAGNOSIS — I10 Essential (primary) hypertension: Secondary | ICD-10-CM | POA: Diagnosis not present

## 2023-02-16 DIAGNOSIS — F419 Anxiety disorder, unspecified: Secondary | ICD-10-CM | POA: Diagnosis not present

## 2023-02-16 DIAGNOSIS — M48061 Spinal stenosis, lumbar region without neurogenic claudication: Secondary | ICD-10-CM | POA: Diagnosis not present

## 2023-02-16 DIAGNOSIS — E119 Type 2 diabetes mellitus without complications: Secondary | ICD-10-CM | POA: Diagnosis not present

## 2023-02-16 DIAGNOSIS — J45909 Unspecified asthma, uncomplicated: Secondary | ICD-10-CM | POA: Diagnosis not present

## 2023-02-16 DIAGNOSIS — I700 Atherosclerosis of aorta: Secondary | ICD-10-CM | POA: Diagnosis not present

## 2023-02-17 DIAGNOSIS — I700 Atherosclerosis of aorta: Secondary | ICD-10-CM | POA: Diagnosis not present

## 2023-02-17 DIAGNOSIS — Z4789 Encounter for other orthopedic aftercare: Secondary | ICD-10-CM | POA: Diagnosis not present

## 2023-02-17 DIAGNOSIS — M48061 Spinal stenosis, lumbar region without neurogenic claudication: Secondary | ICD-10-CM | POA: Diagnosis not present

## 2023-02-17 DIAGNOSIS — M5136 Other intervertebral disc degeneration, lumbar region: Secondary | ICD-10-CM | POA: Diagnosis not present

## 2023-02-17 DIAGNOSIS — E119 Type 2 diabetes mellitus without complications: Secondary | ICD-10-CM | POA: Diagnosis not present

## 2023-02-17 DIAGNOSIS — J45909 Unspecified asthma, uncomplicated: Secondary | ICD-10-CM | POA: Diagnosis not present

## 2023-02-17 DIAGNOSIS — I10 Essential (primary) hypertension: Secondary | ICD-10-CM | POA: Diagnosis not present

## 2023-02-17 DIAGNOSIS — F419 Anxiety disorder, unspecified: Secondary | ICD-10-CM | POA: Diagnosis not present

## 2023-02-17 DIAGNOSIS — J302 Other seasonal allergic rhinitis: Secondary | ICD-10-CM | POA: Diagnosis not present

## 2023-02-20 DIAGNOSIS — F419 Anxiety disorder, unspecified: Secondary | ICD-10-CM | POA: Diagnosis not present

## 2023-02-20 DIAGNOSIS — J302 Other seasonal allergic rhinitis: Secondary | ICD-10-CM | POA: Diagnosis not present

## 2023-02-20 DIAGNOSIS — J45909 Unspecified asthma, uncomplicated: Secondary | ICD-10-CM | POA: Diagnosis not present

## 2023-02-20 DIAGNOSIS — I10 Essential (primary) hypertension: Secondary | ICD-10-CM | POA: Diagnosis not present

## 2023-02-20 DIAGNOSIS — M48061 Spinal stenosis, lumbar region without neurogenic claudication: Secondary | ICD-10-CM | POA: Diagnosis not present

## 2023-02-20 DIAGNOSIS — M5136 Other intervertebral disc degeneration, lumbar region: Secondary | ICD-10-CM | POA: Diagnosis not present

## 2023-02-20 DIAGNOSIS — Z4789 Encounter for other orthopedic aftercare: Secondary | ICD-10-CM | POA: Diagnosis not present

## 2023-02-20 DIAGNOSIS — E119 Type 2 diabetes mellitus without complications: Secondary | ICD-10-CM | POA: Diagnosis not present

## 2023-02-20 DIAGNOSIS — I700 Atherosclerosis of aorta: Secondary | ICD-10-CM | POA: Diagnosis not present

## 2023-02-22 DIAGNOSIS — M48061 Spinal stenosis, lumbar region without neurogenic claudication: Secondary | ICD-10-CM | POA: Diagnosis not present

## 2023-02-22 DIAGNOSIS — I700 Atherosclerosis of aorta: Secondary | ICD-10-CM | POA: Diagnosis not present

## 2023-02-22 DIAGNOSIS — F419 Anxiety disorder, unspecified: Secondary | ICD-10-CM | POA: Diagnosis not present

## 2023-02-22 DIAGNOSIS — J45909 Unspecified asthma, uncomplicated: Secondary | ICD-10-CM | POA: Diagnosis not present

## 2023-02-22 DIAGNOSIS — I10 Essential (primary) hypertension: Secondary | ICD-10-CM | POA: Diagnosis not present

## 2023-02-22 DIAGNOSIS — J302 Other seasonal allergic rhinitis: Secondary | ICD-10-CM | POA: Diagnosis not present

## 2023-02-22 DIAGNOSIS — M5136 Other intervertebral disc degeneration, lumbar region: Secondary | ICD-10-CM | POA: Diagnosis not present

## 2023-02-22 DIAGNOSIS — E119 Type 2 diabetes mellitus without complications: Secondary | ICD-10-CM | POA: Diagnosis not present

## 2023-02-22 DIAGNOSIS — Z4789 Encounter for other orthopedic aftercare: Secondary | ICD-10-CM | POA: Diagnosis not present

## 2023-02-28 DIAGNOSIS — I10 Essential (primary) hypertension: Secondary | ICD-10-CM | POA: Diagnosis not present

## 2023-02-28 DIAGNOSIS — J302 Other seasonal allergic rhinitis: Secondary | ICD-10-CM | POA: Diagnosis not present

## 2023-02-28 DIAGNOSIS — Z4789 Encounter for other orthopedic aftercare: Secondary | ICD-10-CM | POA: Diagnosis not present

## 2023-02-28 DIAGNOSIS — F419 Anxiety disorder, unspecified: Secondary | ICD-10-CM | POA: Diagnosis not present

## 2023-02-28 DIAGNOSIS — J45909 Unspecified asthma, uncomplicated: Secondary | ICD-10-CM | POA: Diagnosis not present

## 2023-02-28 DIAGNOSIS — E119 Type 2 diabetes mellitus without complications: Secondary | ICD-10-CM | POA: Diagnosis not present

## 2023-02-28 DIAGNOSIS — M5030 Other cervical disc degeneration, unspecified cervical region: Secondary | ICD-10-CM | POA: Insufficient documentation

## 2023-02-28 DIAGNOSIS — M48061 Spinal stenosis, lumbar region without neurogenic claudication: Secondary | ICD-10-CM | POA: Diagnosis not present

## 2023-02-28 DIAGNOSIS — M5136 Other intervertebral disc degeneration, lumbar region: Secondary | ICD-10-CM | POA: Diagnosis not present

## 2023-02-28 DIAGNOSIS — I700 Atherosclerosis of aorta: Secondary | ICD-10-CM | POA: Diagnosis not present

## 2023-03-07 DIAGNOSIS — F419 Anxiety disorder, unspecified: Secondary | ICD-10-CM | POA: Diagnosis not present

## 2023-03-07 DIAGNOSIS — I700 Atherosclerosis of aorta: Secondary | ICD-10-CM | POA: Diagnosis not present

## 2023-03-07 DIAGNOSIS — J45909 Unspecified asthma, uncomplicated: Secondary | ICD-10-CM | POA: Diagnosis not present

## 2023-03-07 DIAGNOSIS — E119 Type 2 diabetes mellitus without complications: Secondary | ICD-10-CM | POA: Diagnosis not present

## 2023-03-07 DIAGNOSIS — Z4789 Encounter for other orthopedic aftercare: Secondary | ICD-10-CM | POA: Diagnosis not present

## 2023-03-07 DIAGNOSIS — I10 Essential (primary) hypertension: Secondary | ICD-10-CM | POA: Diagnosis not present

## 2023-03-07 DIAGNOSIS — M48061 Spinal stenosis, lumbar region without neurogenic claudication: Secondary | ICD-10-CM | POA: Diagnosis not present

## 2023-03-07 DIAGNOSIS — M5136 Other intervertebral disc degeneration, lumbar region: Secondary | ICD-10-CM | POA: Diagnosis not present

## 2023-03-07 DIAGNOSIS — J302 Other seasonal allergic rhinitis: Secondary | ICD-10-CM | POA: Diagnosis not present

## 2023-03-10 DIAGNOSIS — F419 Anxiety disorder, unspecified: Secondary | ICD-10-CM | POA: Diagnosis not present

## 2023-03-10 DIAGNOSIS — J302 Other seasonal allergic rhinitis: Secondary | ICD-10-CM | POA: Diagnosis not present

## 2023-03-10 DIAGNOSIS — I700 Atherosclerosis of aorta: Secondary | ICD-10-CM | POA: Diagnosis not present

## 2023-03-10 DIAGNOSIS — J45909 Unspecified asthma, uncomplicated: Secondary | ICD-10-CM | POA: Diagnosis not present

## 2023-03-10 DIAGNOSIS — E119 Type 2 diabetes mellitus without complications: Secondary | ICD-10-CM | POA: Diagnosis not present

## 2023-03-10 DIAGNOSIS — I10 Essential (primary) hypertension: Secondary | ICD-10-CM | POA: Diagnosis not present

## 2023-03-10 DIAGNOSIS — M5136 Other intervertebral disc degeneration, lumbar region: Secondary | ICD-10-CM | POA: Diagnosis not present

## 2023-03-10 DIAGNOSIS — Z4789 Encounter for other orthopedic aftercare: Secondary | ICD-10-CM | POA: Diagnosis not present

## 2023-03-10 DIAGNOSIS — M48061 Spinal stenosis, lumbar region without neurogenic claudication: Secondary | ICD-10-CM | POA: Diagnosis not present

## 2023-03-14 DIAGNOSIS — Z4889 Encounter for other specified surgical aftercare: Secondary | ICD-10-CM | POA: Diagnosis not present

## 2023-03-15 DIAGNOSIS — J45909 Unspecified asthma, uncomplicated: Secondary | ICD-10-CM | POA: Diagnosis not present

## 2023-03-15 DIAGNOSIS — I10 Essential (primary) hypertension: Secondary | ICD-10-CM | POA: Diagnosis not present

## 2023-03-15 DIAGNOSIS — F419 Anxiety disorder, unspecified: Secondary | ICD-10-CM | POA: Diagnosis not present

## 2023-03-15 DIAGNOSIS — M48061 Spinal stenosis, lumbar region without neurogenic claudication: Secondary | ICD-10-CM | POA: Diagnosis not present

## 2023-03-15 DIAGNOSIS — E119 Type 2 diabetes mellitus without complications: Secondary | ICD-10-CM | POA: Diagnosis not present

## 2023-03-15 DIAGNOSIS — I700 Atherosclerosis of aorta: Secondary | ICD-10-CM | POA: Diagnosis not present

## 2023-03-15 DIAGNOSIS — Z4789 Encounter for other orthopedic aftercare: Secondary | ICD-10-CM | POA: Diagnosis not present

## 2023-03-15 DIAGNOSIS — J302 Other seasonal allergic rhinitis: Secondary | ICD-10-CM | POA: Diagnosis not present

## 2023-03-15 DIAGNOSIS — M5136 Other intervertebral disc degeneration, lumbar region: Secondary | ICD-10-CM | POA: Diagnosis not present

## 2023-03-16 DIAGNOSIS — M25551 Pain in right hip: Secondary | ICD-10-CM | POA: Diagnosis not present

## 2023-03-17 DIAGNOSIS — E119 Type 2 diabetes mellitus without complications: Secondary | ICD-10-CM | POA: Diagnosis not present

## 2023-03-17 DIAGNOSIS — I10 Essential (primary) hypertension: Secondary | ICD-10-CM | POA: Diagnosis not present

## 2023-03-17 DIAGNOSIS — J302 Other seasonal allergic rhinitis: Secondary | ICD-10-CM | POA: Diagnosis not present

## 2023-03-17 DIAGNOSIS — I700 Atherosclerosis of aorta: Secondary | ICD-10-CM | POA: Diagnosis not present

## 2023-03-17 DIAGNOSIS — Z4789 Encounter for other orthopedic aftercare: Secondary | ICD-10-CM | POA: Diagnosis not present

## 2023-03-17 DIAGNOSIS — M5136 Other intervertebral disc degeneration, lumbar region: Secondary | ICD-10-CM | POA: Diagnosis not present

## 2023-03-17 DIAGNOSIS — M48061 Spinal stenosis, lumbar region without neurogenic claudication: Secondary | ICD-10-CM | POA: Diagnosis not present

## 2023-03-17 DIAGNOSIS — F419 Anxiety disorder, unspecified: Secondary | ICD-10-CM | POA: Diagnosis not present

## 2023-03-17 DIAGNOSIS — J45909 Unspecified asthma, uncomplicated: Secondary | ICD-10-CM | POA: Diagnosis not present

## 2023-03-20 ENCOUNTER — Other Ambulatory Visit: Payer: Self-pay

## 2023-03-20 ENCOUNTER — Ambulatory Visit (INDEPENDENT_AMBULATORY_CARE_PROVIDER_SITE_OTHER): Payer: Medicare HMO | Admitting: Nurse Practitioner

## 2023-03-20 ENCOUNTER — Encounter: Payer: Self-pay | Admitting: Nurse Practitioner

## 2023-03-20 VITALS — BP 124/76 | HR 77 | Temp 97.7°F | Resp 16 | Ht 64.0 in | Wt 172.8 lb

## 2023-03-20 DIAGNOSIS — F330 Major depressive disorder, recurrent, mild: Secondary | ICD-10-CM | POA: Diagnosis not present

## 2023-03-20 DIAGNOSIS — I700 Atherosclerosis of aorta: Secondary | ICD-10-CM | POA: Diagnosis not present

## 2023-03-20 DIAGNOSIS — J4540 Moderate persistent asthma, uncomplicated: Secondary | ICD-10-CM | POA: Diagnosis not present

## 2023-03-20 DIAGNOSIS — R809 Proteinuria, unspecified: Secondary | ICD-10-CM

## 2023-03-20 DIAGNOSIS — F419 Anxiety disorder, unspecified: Secondary | ICD-10-CM

## 2023-03-20 DIAGNOSIS — E0829 Diabetes mellitus due to underlying condition with other diabetic kidney complication: Secondary | ICD-10-CM

## 2023-03-20 DIAGNOSIS — E1129 Type 2 diabetes mellitus with other diabetic kidney complication: Secondary | ICD-10-CM

## 2023-03-20 DIAGNOSIS — J309 Allergic rhinitis, unspecified: Secondary | ICD-10-CM | POA: Diagnosis not present

## 2023-03-20 DIAGNOSIS — E1169 Type 2 diabetes mellitus with other specified complication: Secondary | ICD-10-CM | POA: Diagnosis not present

## 2023-03-20 DIAGNOSIS — Z7985 Long-term (current) use of injectable non-insulin antidiabetic drugs: Secondary | ICD-10-CM

## 2023-03-20 DIAGNOSIS — E785 Hyperlipidemia, unspecified: Secondary | ICD-10-CM

## 2023-03-20 DIAGNOSIS — G43119 Migraine with aura, intractable, without status migrainosus: Secondary | ICD-10-CM | POA: Diagnosis not present

## 2023-03-20 DIAGNOSIS — I10 Essential (primary) hypertension: Secondary | ICD-10-CM | POA: Diagnosis not present

## 2023-03-20 MED ORDER — TRULICITY 0.75 MG/0.5ML ~~LOC~~ SOAJ: 0.7500 mg | SUBCUTANEOUS | 1 refills | Status: DC

## 2023-03-20 MED ORDER — DEXCOM G7 SENSOR MISC
5 refills | Status: DC
Start: 2023-03-20 — End: 2023-04-28

## 2023-03-20 NOTE — Assessment & Plan Note (Signed)
Continue trulicity 0.75 mg weekly, farxiga 5 mg daily.  Continue to monitor blood sugar.

## 2023-03-20 NOTE — Assessment & Plan Note (Signed)
Continue taking propanolol 20 mg TID, losartan 25 mg daily

## 2023-03-20 NOTE — Assessment & Plan Note (Signed)
Continue  albuterol, breztri, singulair, astelin

## 2023-03-20 NOTE — Assessment & Plan Note (Signed)
Continue rosuvastatin 40mg daily  

## 2023-03-20 NOTE — Assessment & Plan Note (Signed)
Continue celexa 40 mg daily 

## 2023-03-20 NOTE — Progress Notes (Signed)
BP 124/76   Pulse 77   Temp 97.7 F (36.5 C) (Oral)   Resp 16   Ht 5\' 4"  (1.626 m)   Wt 172 lb 12.8 oz (78.4 kg)   SpO2 97%   BMI 29.66 kg/m    Subjective:    Patient ID: Lori Potts, female    DOB: 1945/10/05, 77 y.o.   MRN: [id number]  HPI: Lori Potts is a 77 y.o. female, with husband  Chief Complaint  Patient presents with   Diabetes   Hypertension   Headache   Diabetes, Type 2:  -Last A1c 7.8 -Medications: trulicity 0.75 mg weekly, farxiga 5 mg daily.  -Patient is compliant with the above medications and reports no side effects.  -Checking BG at home: 120-145 -Diet: reduce sugar and processed foods in your diet  -Exercise: recommend 150 min of physical activity weekly   -Eye exam: due -Foot exam: utd -Microalbumin: utd -Statin: yes -PNA vaccine: utd -Denies symptoms of hypoglycemia, polyuria, polydipsia, numbness extremities, foot ulcers/trauma.   - she would like to have dexcom, for continuous glucose monitoring,  sample provided and prescription sent in/DME order  Hypertension:  -Medications: propanolol 20 mg TID, losartan 25 mg daily -Patient is compliant with above medications and reports no side effects. -Checking BP at home (average): 120s -Denies any SOB, CP, vision changes, LE edema or symptoms of hypotension -Diet: recommend DASH diet  -Exercise: recommend 150 min of physical activity weekly     HLD/calcification of aorta:  -Medications: rosuvastatin 40 mg daily -Patient is compliant with above medications and reports no side effects.  -Last lipid panel:  Lipid Panel     Component Value Date/Time   CHOL 156 09/15/2022 1013   TRIG 91 09/15/2022 1013   HDL 58 09/15/2022 1013   CHOLHDL 2.7 09/15/2022 1013   VLDL 18 07/04/2016 1016   LDLCALC 81 09/15/2022 1013    The 10-year ASCVD risk score (Arnett DK, et al., 2019) is: 29.1%   Values used to calculate the score:     Age: 39 years     Sex: Female     Is Non-Hispanic African  American: Yes     Diabetic: Yes     Tobacco smoker: No     Systolic Blood Pressure: 124 mmHg     Is BP treated: Yes     HDL Cholesterol: 58 mg/dL     Total Cholesterol: 156 mg/dL   Asthma/allergies:    -Asthma status: controlled -Current Treatments: albuterol, breztri, singulair, astelin -Satisfied with current treatment?: yes -Albuterol/rescue inhaler frequency:  -Dyspnea frequency:  -Wheezing frequency: -Cough frequency:  -Nocturnal symptom frequency:  -Limitation of activity: no -Current upper respiratory symptoms: no -Triggers:  -Home peak flows: -Last Spirometry:  -Failed/intolerant to following asthma meds:  -Asthma meds in past:  -Aerochamber/spacer use: no -Visits to ER or Urgent Care in past year: no -Pneumovax: Up to Date -Influenza: Up to Date    Migraines: Patient has a history of right-sided migraines for many years.  She has tried multiple preventative and abortive therapies.  She is currently taking propranolol 20 mg 3 times daily. Reports she is doing well. She has not been able to see neurology due to all her orthopedic surgeries.    Anxiety/depression: Currently takes Celexa 40 mg daily.  Patient reports her mental health has been doing well.no changes     03/20/2023    8:09 AM 01/02/2023    8:12 AM 09/20/2022   12:46 PM 09/15/2022  9:42 AM  GAD 7 : Generalized Anxiety Score  Nervous, Anxious, on Edge 1 0 1 1  Control/stop worrying 1 0 0 0  Worry too much - different things 0 0 0 0  Trouble relaxing 0 0 0 0  Restless 0 0 0 0  Easily annoyed or irritable 0 0 0 0  Afraid - awful might happen 0 0 0 0  Total GAD 7 Score 2 0 1 1  Anxiety Difficulty Not difficult at all Not difficult at all Not difficult at all Not difficult at all        03/20/2023    8:09 AM 02/10/2023    1:15 PM 01/02/2023    8:12 AM 09/20/2022   12:45 PM 09/15/2022    9:42 AM  Depression screen PHQ 2/9  Decreased Interest 0 0 0 0 0  Down, Depressed, Hopeless 0 0 0 0 0  PHQ - 2  Score 0 0 0 0 0  Altered sleeping 0  1 0 0  Tired, decreased energy 1  1 1 1   Change in appetite 0  0 0 0  Feeling bad or failure about yourself  0  0 0 0  Trouble concentrating 0  0 0 0  Moving slowly or fidgety/restless 0  0 0 0  Suicidal thoughts 0  0 0 0  PHQ-9 Score 1  2 1 1   Difficult doing work/chores Not difficult at all  Not difficult at all Not difficult at all     CKD: her last GFR was 59 on 04/07/2022. Instructed to avoid nsaids and push fluids. No changes.    Latest Ref Rng & Units 01/02/2023    8:31 AM 09/15/2022   10:13 AM 06/09/2022    3:34 AM  CMP  Glucose 65 - 99 mg/dL 440  347  425   BUN 7 - 25 mg/dL 20  16  21    Creatinine 0.60 - 1.00 mg/dL 9.56  3.87  5.64   Sodium 135 - 146 mmol/L 141  142  138   Potassium 3.5 - 5.3 mmol/L 3.9  4.1  3.9   Chloride 98 - 110 mmol/L 105  107  107   CO2 20 - 32 mmol/L 26  27  22    Calcium 8.6 - 10.4 mg/dL 33.2  95.1  9.3   Total Protein 6.1 - 8.1 g/dL 6.8  6.8    Total Bilirubin 0.2 - 1.2 mg/dL 0.5  0.3    AST 10 - 35 U/L 16  15    ALT 6 - 29 U/L 10  12       Relevant past medical, surgical, family and social history reviewed and updated as indicated. Interim medical history since our last visit reviewed. Allergies and medications reviewed and updated.  Review of Systems  Constitutional: Negative for fever or weight change.  Respiratory: Negative for cough and shortness of breath.   Cardiovascular: Negative for chest pain or palpitations.  Gastrointestinal: Negative for abdominal pain, no bowel changes.  Musculoskeletal: Negative for gait problem or joint swelling.  Skin: Negative for rash.  Neurological: Negative for dizziness, positive for headache.  No other specific complaints in a complete review of systems (except as listed in HPI above).      Objective:    BP 124/76   Pulse 77   Temp 97.7 F (36.5 C) (Oral)   Resp 16   Ht 5\' 4"  (1.626 m)   Wt 172 lb 12.8 oz (78.4 kg)   SpO2  97%   BMI 29.66 kg/m   Wt  Readings from Last 3 Encounters:  03/20/23 172 lb 12.8 oz (78.4 kg)  01/30/23 176 lb (79.8 kg)  01/20/23 175 lb (79.4 kg)    Physical Exam  Constitutional: Patient appears well-developed and well-nourished. No distress.  HEENT: head atraumatic, normocephalic, pupils equal and reactive to light, neck supple Cardiovascular: Normal rate, regular rhythm and normal heart sounds.  No murmur heard. No BLE edema. Pulmonary/Chest: Effort normal and breath sounds normal. No respiratory distress. Abdominal: Soft.  There is no tenderness. Psychiatric: Patient has a normal mood and affect. behavior is normal. Judgment and thought content normal.   Results for orders placed or performed during the hospital encounter of 01/30/23  Glucose, capillary  Result Value Ref Range   Glucose-Capillary 109 (H) 70 - 99 mg/dL  Glucose, capillary  Result Value Ref Range   Glucose-Capillary 215 (H) 70 - 99 mg/dL  Glucose, capillary  Result Value Ref Range   Glucose-Capillary 190 (H) 70 - 99 mg/dL  Glucose, capillary  Result Value Ref Range   Glucose-Capillary 157 (H) 70 - 99 mg/dL   Comment 1 Notify RN    Comment 2 Document in Chart   Glucose, capillary  Result Value Ref Range   Glucose-Capillary 171 (H) 70 - 99 mg/dL   Comment 1 Notify RN    Comment 2 Document in Chart   Glucose, capillary  Result Value Ref Range   Glucose-Capillary 132 (H) 70 - 99 mg/dL   Comment 1 Notify RN    Comment 2 Document in Chart   Glucose, capillary  Result Value Ref Range   Glucose-Capillary 149 (H) 70 - 99 mg/dL  Glucose, capillary  Result Value Ref Range   Glucose-Capillary 125 (H) 70 - 99 mg/dL   Comment 1 Notify RN    Comment 2 Document in Chart   Glucose, capillary  Result Value Ref Range   Glucose-Capillary 145 (H) 70 - 99 mg/dL   Comment 1 Notify RN    Comment 2 Document in Chart   Glucose, capillary  Result Value Ref Range   Glucose-Capillary 92 70 - 99 mg/dL   Comment 1 Notify RN    Comment 2 Document  in Chart   Glucose, capillary  Result Value Ref Range   Glucose-Capillary 151 (H) 70 - 99 mg/dL   Comment 1 Notify RN    Comment 2 Document in Chart   Glucose, capillary  Result Value Ref Range   Glucose-Capillary 130 (H) 70 - 99 mg/dL  Glucose, capillary  Result Value Ref Range   Glucose-Capillary 124 (H) 70 - 99 mg/dL   Comment 1 Notify RN    Comment 2 Document in Chart       Assessment & Plan:   Problem List Items Addressed This Visit       Cardiovascular and Mediastinum   Essential (primary) hypertension    Continue taking propanolol 20 mg TID, losartan 25 mg daily      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HCC)    Continue rosuvastatin 40 mg daily      Intractable migraine with aura without status migrainosus    Stable, continue propanolol 20 mg TID        Respiratory   Allergic rhinitis    Continue  albuterol, breztri, singulair, astelin      Moderate persistent asthma without complication    Continue  albuterol, breztri, singulair, astelin        Endocrine   Type 2 diabetes  mellitus with microalbuminuria, without long-term current use of insulin (HCC) - Primary    Continue trulicity 0.75 mg weekly, farxiga 5 mg daily.  Continue to monitor blood sugar.        Relevant Medications   Dulaglutide (TRULICITY) 0.75 MG/0.5ML SOPN   Hyperlipidemia associated with type 2 diabetes mellitus (HCC)    Continue rosuvastatin 40 mg daily      Relevant Medications   Dulaglutide (TRULICITY) 0.75 MG/0.5ML SOPN     Other   Anxiety    Continue celexa 40 mg daily      Mild episode of recurrent major depressive disorder (HCC)    Continue celexa 40 mg daily      Other Visit Diagnoses     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, without long-term current use of insulin (HCC)       Relevant Medications   Dulaglutide (TRULICITY) 0.75 MG/0.5ML SOPN   Continuous Glucose Sensor (DEXCOM G7 SENSOR) MISC   Other Relevant Orders   For home use only DME  Other see comment         Follow up plan: Return in about 6 months (around 09/20/2023) for follow up.

## 2023-03-20 NOTE — Assessment & Plan Note (Signed)
Stable, continue propanolol 20 mg TID

## 2023-03-21 ENCOUNTER — Ambulatory Visit: Payer: Medicare HMO | Admitting: Nurse Practitioner

## 2023-03-22 DIAGNOSIS — J302 Other seasonal allergic rhinitis: Secondary | ICD-10-CM | POA: Diagnosis not present

## 2023-03-22 DIAGNOSIS — Z4789 Encounter for other orthopedic aftercare: Secondary | ICD-10-CM | POA: Diagnosis not present

## 2023-03-22 DIAGNOSIS — M48061 Spinal stenosis, lumbar region without neurogenic claudication: Secondary | ICD-10-CM | POA: Diagnosis not present

## 2023-03-22 DIAGNOSIS — J45909 Unspecified asthma, uncomplicated: Secondary | ICD-10-CM | POA: Diagnosis not present

## 2023-03-22 DIAGNOSIS — I700 Atherosclerosis of aorta: Secondary | ICD-10-CM | POA: Diagnosis not present

## 2023-03-22 DIAGNOSIS — I10 Essential (primary) hypertension: Secondary | ICD-10-CM | POA: Diagnosis not present

## 2023-03-22 DIAGNOSIS — F419 Anxiety disorder, unspecified: Secondary | ICD-10-CM | POA: Diagnosis not present

## 2023-03-22 DIAGNOSIS — E119 Type 2 diabetes mellitus without complications: Secondary | ICD-10-CM | POA: Diagnosis not present

## 2023-03-22 DIAGNOSIS — M5136 Other intervertebral disc degeneration, lumbar region: Secondary | ICD-10-CM | POA: Diagnosis not present

## 2023-03-23 DIAGNOSIS — M5459 Other low back pain: Secondary | ICD-10-CM | POA: Diagnosis not present

## 2023-03-23 DIAGNOSIS — G894 Chronic pain syndrome: Secondary | ICD-10-CM | POA: Diagnosis not present

## 2023-03-23 DIAGNOSIS — Z79891 Long term (current) use of opiate analgesic: Secondary | ICD-10-CM | POA: Diagnosis not present

## 2023-03-23 DIAGNOSIS — Z4889 Encounter for other specified surgical aftercare: Secondary | ICD-10-CM | POA: Diagnosis not present

## 2023-03-23 DIAGNOSIS — M5030 Other cervical disc degeneration, unspecified cervical region: Secondary | ICD-10-CM | POA: Diagnosis not present

## 2023-03-24 DIAGNOSIS — J45909 Unspecified asthma, uncomplicated: Secondary | ICD-10-CM | POA: Diagnosis not present

## 2023-03-24 DIAGNOSIS — M5136 Other intervertebral disc degeneration, lumbar region: Secondary | ICD-10-CM | POA: Diagnosis not present

## 2023-03-24 DIAGNOSIS — M48061 Spinal stenosis, lumbar region without neurogenic claudication: Secondary | ICD-10-CM | POA: Diagnosis not present

## 2023-03-24 DIAGNOSIS — F419 Anxiety disorder, unspecified: Secondary | ICD-10-CM | POA: Diagnosis not present

## 2023-03-24 DIAGNOSIS — I10 Essential (primary) hypertension: Secondary | ICD-10-CM | POA: Diagnosis not present

## 2023-03-24 DIAGNOSIS — E119 Type 2 diabetes mellitus without complications: Secondary | ICD-10-CM | POA: Diagnosis not present

## 2023-03-24 DIAGNOSIS — J302 Other seasonal allergic rhinitis: Secondary | ICD-10-CM | POA: Diagnosis not present

## 2023-03-24 DIAGNOSIS — I700 Atherosclerosis of aorta: Secondary | ICD-10-CM | POA: Diagnosis not present

## 2023-03-24 DIAGNOSIS — Z4789 Encounter for other orthopedic aftercare: Secondary | ICD-10-CM | POA: Diagnosis not present

## 2023-03-30 ENCOUNTER — Other Ambulatory Visit: Payer: Self-pay | Admitting: Nurse Practitioner

## 2023-03-30 ENCOUNTER — Telehealth: Payer: Self-pay | Admitting: Emergency Medicine

## 2023-03-30 ENCOUNTER — Ambulatory Visit (INDEPENDENT_AMBULATORY_CARE_PROVIDER_SITE_OTHER): Payer: Medicare HMO

## 2023-03-30 VITALS — Ht 63.5 in | Wt 172.0 lb

## 2023-03-30 DIAGNOSIS — R051 Acute cough: Secondary | ICD-10-CM

## 2023-03-30 DIAGNOSIS — Z0000 Encounter for general adult medical examination without abnormal findings: Secondary | ICD-10-CM

## 2023-03-30 DIAGNOSIS — I10 Essential (primary) hypertension: Secondary | ICD-10-CM

## 2023-03-30 MED ORDER — HYDROCOD POLI-CHLORPHE POLI ER 10-8 MG/5ML PO SUER
5.0000 mL | Freq: Two times a day (BID) | ORAL | 0 refills | Status: DC | PRN
Start: 2023-03-30 — End: 2023-04-28

## 2023-03-30 NOTE — Telephone Encounter (Signed)
Patient called and would like to know if something can be called in for her cough. Her husband has tested positive for covid, but she tested negative.

## 2023-03-30 NOTE — Patient Instructions (Signed)
[name] , Thank you for taking time to come for your Medicare Wellness Visit. I appreciate your ongoing commitment to your health goals. Please review the following plan we discussed and let me know if I can assist you in the future.   Referrals/Orders/Follow-Ups/Clinician Recommendations: none  This is a list of the screening recommended for you and due dates:  Health Maintenance  Topic Date Due   Eye exam for diabetics  06/02/2021   Flu Shot  03/23/2023   COVID-19 Vaccine (7 - 2023-24 season) 04/05/2023*   Zoster (Shingles) Vaccine (1 of 2) 06/20/2023*   Mammogram  03/19/2024*   Complete foot exam   05/24/2023   Yearly kidney health urinalysis for diabetes  06/01/2023   Hemoglobin A1C  07/05/2023   Yearly kidney function blood test for diabetes  01/02/2024   Medicare Annual Wellness Visit  03/29/2024   DTaP/Tdap/Td vaccine (2 - Td or Tdap) 05/07/2029   Pneumonia Vaccine  Completed   DEXA scan (bone density measurement)  Completed   Hepatitis C Screening  Completed   HPV Vaccine  Aged Out   Colon Cancer Screening  Discontinued  *Topic was postponed. The date shown is not the original due date.    Advanced directives: (Copy Requested) Please bring a copy of your health care power of attorney and living will to the office to be added to your chart at your convenience.  Next Medicare Annual Wellness Visit scheduled for next year: Yes 04/04/24 @ 8:15am telephone  Preventive Care 65 Years and Older, Female Preventive care refers to lifestyle choices and visits with your health care provider that can promote health and wellness. What does preventive care include? A yearly physical exam. This is also called an annual well check. Dental exams once or twice a year. Routine eye exams. Ask your health care provider how often you should have your eyes checked. Personal lifestyle choices, including: Daily care of your teeth and gums. Regular physical activity. Eating a healthy  diet. Avoiding tobacco and drug use. Limiting alcohol use. Practicing safe sex. Taking low-dose aspirin every day. Taking vitamin and mineral supplements as recommended by your health care provider. What happens during an annual well check? The services and screenings done by your health care provider during your annual well check will depend on your age, overall health, lifestyle risk factors, and family history of disease. Counseling  Your health care provider may ask you questions about your: Alcohol use. Tobacco use. Drug use. Emotional well-being. Home and relationship well-being. Sexual activity. Eating habits. History of falls. Memory and ability to understand (cognition). Work and work Astronomer. Reproductive health. Screening  You may have the following tests or measurements: Height, weight, and BMI. Blood pressure. Lipid and cholesterol levels. These may be checked every 5 years, or more frequently if you are over 82 years old. Skin check. Lung cancer screening. You may have this screening every year starting at age 34 if you have a 30-pack-year history of smoking and currently smoke or have quit within the past 15 years. Fecal occult blood test (FOBT) of the stool. You may have this test every year starting at age 28. Flexible sigmoidoscopy or colonoscopy. You may have a sigmoidoscopy every 5 years or a colonoscopy every 10 years starting at age 45. Hepatitis C blood test. Hepatitis B blood test. Sexually transmitted disease (STD) testing. Diabetes screening. This is done by checking your blood sugar (glucose) after you have not eaten for a while (fasting). You may have this  done every 1-3 years. Bone density scan. This is done to screen for osteoporosis. You may have this done starting at age 60. Mammogram. This may be done every 1-2 years. Talk to your health care provider about how often you should have regular mammograms. Talk with your health care provider about  your test results, treatment options, and if necessary, the need for more tests. Vaccines  Your health care provider may recommend certain vaccines, such as: Influenza vaccine. This is recommended every year. Tetanus, diphtheria, and acellular pertussis (Tdap, Td) vaccine. You may need a Td booster every 10 years. Zoster vaccine. You may need this after age 91. Pneumococcal 13-valent conjugate (PCV13) vaccine. One dose is recommended after age 76. Pneumococcal polysaccharide (PPSV23) vaccine. One dose is recommended after age 71. Talk to your health care provider about which screenings and vaccines you need and how often you need them. This information is not intended to replace advice given to you by your health care provider. Make sure you discuss any questions you have with your health care provider. Document Released: 09/04/2015 Document Revised: 04/27/2016 Document Reviewed: 06/09/2015 Elsevier Interactive Patient Education  2017 [employer].  Fall Prevention in the Home Falls can cause injuries. They can happen to people of all ages. There are many things you can do to make your home safe and to help prevent falls. What can I do on the outside of my home? Regularly fix the edges of walkways and driveways and fix any cracks. Remove anything that might make you trip as you walk through a door, such as a raised step or threshold. Trim any bushes or trees on the path to your home. Use bright outdoor lighting. Clear any walking paths of anything that might make someone trip, such as rocks or tools. Regularly check to see if handrails are loose or broken. Make sure that both sides of any steps have handrails. Any raised decks and porches should have guardrails on the edges. Have any leaves, snow, or ice cleared regularly. Use sand or salt on walking paths during winter. Clean up any spills in your garage right away. This includes oil or grease spills. What can I do in the bathroom? Use  night lights. Install grab bars by the toilet and in the tub and shower. Do not use towel bars as grab bars. Use non-skid mats or decals in the tub or shower. If you need to sit down in the shower, use a plastic, non-slip stool. Keep the floor dry. Clean up any water that spills on the floor as soon as it happens. Remove soap buildup in the tub or shower regularly. Attach bath mats securely with double-sided non-slip rug tape. Do not have throw rugs and other things on the floor that can make you trip. What can I do in the bedroom? Use night lights. Make sure that you have a light by your bed that is easy to reach. Do not use any sheets or blankets that are too big for your bed. They should not hang down onto the floor. Have a firm chair that has side arms. You can use this for support while you get dressed. Do not have throw rugs and other things on the floor that can make you trip. What can I do in the kitchen? Clean up any spills right away. Avoid walking on wet floors. Keep items that you use a lot in easy-to-reach places. If you need to reach something above you, use a strong step stool that  has a grab bar. Keep electrical cords out of the way. Do not use floor polish or wax that makes floors slippery. If you must use wax, use non-skid floor wax. Do not have throw rugs and other things on the floor that can make you trip. What can I do with my stairs? Do not leave any items on the stairs. Make sure that there are handrails on both sides of the stairs and use them. Fix handrails that are broken or loose. Make sure that handrails are as long as the stairways. Check any carpeting to make sure that it is firmly attached to the stairs. Fix any carpet that is loose or worn. Avoid having throw rugs at the top or bottom of the stairs. If you do have throw rugs, attach them to the floor with carpet tape. Make sure that you have a light switch at the top of the stairs and the bottom of the  stairs. If you do not have them, ask someone to add them for you. What else can I do to help prevent falls? Wear shoes that: Do not have high heels. Have rubber bottoms. Are comfortable and fit you well. Are closed at the toe. Do not wear sandals. If you use a stepladder: Make sure that it is fully opened. Do not climb a closed stepladder. Make sure that both sides of the stepladder are locked into place. Ask someone to hold it for you, if possible. Clearly mark and make sure that you can see: Any grab bars or handrails. First and last steps. Where the edge of each step is. Use tools that help you move around (mobility aids) if they are needed. These include: Canes. Walkers. Scooters. Crutches. Turn on the lights when you go into a dark area. Replace any light bulbs as soon as they burn out. Set up your furniture so you have a clear path. Avoid moving your furniture around. If any of your floors are uneven, fix them. If there are any pets around you, be aware of where they are. Review your medicines with your doctor. Some medicines can make you feel dizzy. This can increase your chance of falling. Ask your doctor what other things that you can do to help prevent falls. This information is not intended to replace advice given to you by your health care provider. Make sure you discuss any questions you have with your health care provider. Document Released: 06/04/2009 Document Revised: 01/14/2016 Document Reviewed: 09/12/2014 Elsevier Interactive Patient Education  2017 [employer].

## 2023-03-30 NOTE — Progress Notes (Signed)
Subjective:   Lori Potts is a 77 y.o. female who presents for Medicare Annual (Subsequent) preventive examination.  Visit Complete: Virtual  I connected with  Lori Potts on 03/30/23 by a audio enabled telemedicine application and verified that I am speaking with the correct person using two identifiers.  Patient Location: Home  Provider Location: Home Office  I discussed the limitations of evaluation and management by telemedicine. The patient expressed understanding and agreed to proceed.  Vital Signs: Unable to obtain new vitals due to this being a telehealth visit.  Patient Medicare AWV questionnaire was completed by the patient on (not done); I have confirmed that all information answered by patient is correct and no changes since this date.  Review of Systems    Cardiac Risk Factors include: advanced age (>41men, >68 women);diabetes mellitus;hypertension;dyslipidemia;sedentary lifestyle    Objective:    Today's Vitals   03/30/23 0818 03/30/23 0819  Weight: 172 lb (78 kg)   Height: 5' 3.5" (1.613 m)   PainSc:  8    Body mass index is 29.99 kg/m.     03/30/2023    8:29 AM 01/20/2023   10:18 AM 06/08/2022    1:14 PM 06/03/2022   10:00 AM 11/18/2021    2:38 PM 11/05/2020    9:48 AM 09/17/2020    7:37 AM  Advanced Directives  Does Patient Have a Medical Advance Directive? Yes Yes Yes Yes Yes Yes Yes  Type of Estate agent of [place];Living will Healthcare Power of [place];Living will Living will;Healthcare Power of Attorney Living will;Healthcare Power of [employer] Power of [place];Living will Healthcare Power of [place];Living will Living will  Does patient want to make changes to medical advance directive?   No - Patient declined    No - Patient declined  Copy of Healthcare Power of Attorney in Chart?  No - copy requested No - copy requested  No - copy requested No - copy requested     Current Medications  (verified) Outpatient Encounter Medications as of 03/30/2023  Medication Sig   Oxycodone HCl 10 MG TABS Take 1 tablet 4 times a day by oral route as needed for 30 days.   albuterol (PROVENTIL) (2.5 MG/3ML) 0.083% nebulizer solution Take 3 mLs (2.5 mg total) by nebulization every 6 (six) hours as needed for wheezing or shortness of breath.   albuterol (VENTOLIN HFA) 108 (90 Base) MCG/ACT inhaler Inhale 2 puffs into the lungs every 6 (six) hours as needed for wheezing or shortness of breath.   azelastine (ASTELIN) 0.1 % nasal spray PLACE 1 SPRAY INTO BOTH NOSTRILS 2 (TWO) TIMES DAILY. USE IN EACH NOSTRIL AS DIRECTED (Patient taking differently: Place 1 spray into both nostrils 2 (two) times daily as needed for rhinitis or allergies. Use in each nostril as directed)   Budeson-Glycopyrrol-Formoterol (BREZTRI AEROSPHERE) 160-9-4.8 MCG/ACT AERO Inhale 2 puffs into the lungs in the morning and at bedtime.   citalopram (CELEXA) 40 MG tablet Take 1 tablet (40 mg total) by mouth daily.   Continuous Glucose Sensor (DEXCOM G7 SENSOR) MISC Change every 10 days   dapagliflozin propanediol (FARXIGA) 5 MG TABS tablet Take 1 tablet (5 mg total) by mouth daily before breakfast.   docusate sodium (COLACE) 100 MG capsule Take 100 mg by mouth 2 (two) times daily.   Dulaglutide (TRULICITY) 0.75 MG/0.5ML SOPN Inject 0.75 mg into the skin once a week. Patient receives via [employer] Patient Assistance through Dec 2023   esomeprazole (NEXIUM) 40 MG capsule  Take 1 capsule (40 mg total) by mouth at bedtime.   losartan (COZAAR) 25 MG tablet Take 1 tablet (25 mg total) by mouth daily.   montelukast (SINGULAIR) 10 MG tablet TAKE 1 TABLET BY MOUTH EVERYDAY AT BEDTIME   ondansetron (ZOFRAN) 4 MG tablet Take 1 tablet (4 mg total) by mouth every 8 (eight) hours as needed for nausea or vomiting.   promethazine (PHENERGAN) 12.5 MG tablet Take 1 tablet (12.5 mg total) by mouth every 8 (eight) hours as needed for nausea or vomiting.    propranolol (INDERAL) 20 MG tablet Take 1 tablet (20 mg total) by mouth 3 (three) times daily.   rosuvastatin (CRESTOR) 40 MG tablet Take 1 tablet (40 mg total) by mouth daily.   No facility-administered encounter medications on file as of 03/30/2023.    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   Cataract    right eye but immature   Essential hypertension, benign    takes Lisinopril-HCTZ daily   GERD (gastroesophageal reflux disease)    Headache    sinus   History of colon polyps    benign   History of migraine    History of shingles    Hyperlipidemia    takes Lipitor daily   Low back pain    Nocturia    S/P insertion of spinal cord stimulator    Seasonal allergies    takes Singulair daily as needed   Type II or unspecified type diabetes mellitus without mention of complication, not stated as uncontrolled    Vitamin D deficiency    takes Vit D weekly   Weakness    numbness and tingling left arm   Wears dentures    full upper and lower   Past Surgical History:  Procedure Laterality Date   ABDOMINAL HYSTERECTOMY     ANTERIOR CERVICAL DECOMP/DISCECTOMY FUSION N/A 02/26/2015   Procedure: ANTERIOR CERVICAL DISCECTOMY FUSION C4-5 (1 LEVEL);  Surgeon: Venita Lick, MD;  Location: [facility] OR;  Service: Orthopedics;  Laterality: N/A;   ANTERIOR LAT LUMBAR FUSION N/A 03/21/2013   Procedure: ANTERIOR LATERAL LUMBAR FUSION 1 LEVEL/ XLIF L3-L4 ;  Surgeon: Venita Lick, MD;  Location: [facility] OR;  Service: Orthopedics;  Laterality: N/A;   ANTERIOR LATERAL LUMBAR FUSION WITH PERCUTANEOUS SCREW 1 LEVEL N/A 01/30/2023   Procedure: LATERAL INTERBODY FUSION LUMBAR TWO TO THREE;  Surgeon: Venita Lick, MD;  Location: [facility] OR;  Service: Orthopedics;  Laterality: N/A;  5 HRS 3 C-BED LEFT TAP BLOCK WITH EXPAREL   APPENDECTOMY     AUGMENTATION MAMMAPLASTY  Bilateral 1978   BACK SURGERY     BACK SURGERY     Lumbar fusion x 2   BREAST EXCISIONAL BIOPSY Right 1970   BREAST SURGERY  1990   Augementation   CARDIAC CATHETERIZATION  5/12   ef 55%   CARDIAC CATHETERIZATION  10/2010   [facility]; EF 55%   CARDIAC CATHETERIZATION Left 02/16/2016   Procedure: Left Heart Cath and Coronary Angiography;  Surgeon: Alwyn Pea, MD;  Location: [facility] INVASIVE CV LAB;  Service: Cardiovascular;  Laterality: Left;   COLONOSCOPY     COLONOSCOPY WITH PROPOFOL N/A 04/10/2017   Procedure: COLONOSCOPY WITH PROPOFOL;  Surgeon: Midge Minium, MD;  [facility];  Service: Gastroenterology;  Laterality: N/A;  Diabetic - insulin   ESOPHAGOGASTRODUODENOSCOPY  JOINT REPLACEMENT     KIDNEY SURGERY  1998   growth removed from left kidney    LEFT HEART CATH AND CORONARY ANGIOGRAPHY Left 09/10/2018   Procedure: LEFT HEART CATH AND CORONARY ANGIOGRAPHY;  Surgeon: Alwyn Pea, MD;  Location: [facility] INVASIVE CV LAB;  Service: Cardiovascular;  Laterality: Left;   pain stimulator     POSTERIOR CERVICAL FUSION/FORAMINOTOMY Right 03/21/2013   Procedure: POSTERIOR L2-3 RIGHT FORAMINOTOMY;  Surgeon: Venita Lick, MD;  Location: [facility] OR;  Service: Orthopedics;  Laterality: Right;   REVISION OF SCAR TISSUE RECTUS MUSCLE     SHOULDER ARTHROSCOPY WITH ROTATOR CUFF REPAIR AND SUBACROMIAL DECOMPRESSION Right 09/17/2020   Procedure: RIGHT SHOULDER ARTHROSCOPY WITH MINI-OPEN ROTATOR CUFF REPAIR, DISTAL CLAVICLE EXCISION, AND SUBACROMIAL DECOMPRESSION WITH BICEP TENDONESIS;  Surgeon: Juanell Fairly, MD;  Location: [facility] ORS;  Service: Orthopedics;  Laterality: Right;   SMALL BOWEL REPAIR     SPINAL CORD STIMULATOR BATTERY EXCHANGE N/A 10/17/2012   Procedure: SPINAL CORD STIMULATOR BATTERY REMOVAL;  Surgeon: Venita Lick, MD;  Location: [facility];  Service: Orthopedics;  Laterality: N/A;   SPINAL CORD STIMULATOR BATTERY EXCHANGE N/A 07/22/2015   Procedure: REIMPLANTATION OF  SPINAL CORD STIMULATOR BATTERY ;  Surgeon: Venita Lick, MD;  Location: [facility] OR;  Service: Orthopedics;  Laterality: N/A;   TOTAL HIP ARTHROPLASTY Left 06/08/2022   Procedure: TOTAL HIP ARTHROPLASTY ANTERIOR APPROACH;  Surgeon: Ollen Gross, MD;  Location: [facility] ORS;  Service: Orthopedics;  Laterality: Left;   TOTAL KNEE ARTHROPLASTY Left    TOTAL KNEE ARTHROPLASTY Right 07/28/2017   Procedure: RIGHT TOTAL KNEE ARTHROPLASTY;  Surgeon: Eugenia Mcalpine, MD;  Location: [facility] OR[facility];  Service: Orthopedics;  Laterality: Right;   Family History  Problem Relation Age of Onset   Heart attack Mother    Sarcoidosis Sister    Social History   Socioeconomic History   Marital status: Married    Spouse name: Chrissie Noa   Number of children: 3   Years of education: Not on file   Highest education level: Associate degree: occupational, Scientist, product/process development, or vocational program  Occupational History   Not on file  Tobacco Use   Smoking status: Former    Current packs/day: 0.00    Types: Cigarettes    Quit date: 2006    Years since quitting: 18.6   Smokeless tobacco: Never   Tobacco comments:    quit smoking 64yrs ago  Vaping Use   Vaping status: Never Used  Substance and Sexual Activity   Alcohol use: No   Drug use: No   Sexual activity: Yes    Partners: Male    Birth control/protection: Surgical  Other Topics Concern   Not on file  Social History Narrative   Not on file   Social Determinants of Health   Financial Resource Strain: Low Risk  (03/30/2023)   Overall Financial Resource Strain (CARDIA)    Difficulty of Paying Living Expenses: Not hard at all  Food Insecurity: No Food Insecurity (03/30/2023)   Hunger Vital Sign    Worried About Running Out of Food in the Last Year: Never true    Ran Out of Food in the Last Year: Never true  Transportation Needs: No Transportation Needs (03/30/2023)   PRAPARE - Administrator, Civil Service (Medical): No    Lack of Transportation (Non-Medical): No   Physical Activity: Inactive (03/30/2023)   Exercise Vital Sign    Days of Exercise per Week: 0 days    Minutes of Exercise per Session: 0  min  Stress: No Stress Concern Present (03/30/2023)   [employer] of Occupational Health - Occupational Stress Questionnaire    Feeling of Stress : Not at all  Social Connections: Socially Integrated (03/30/2023)   Social Connection and Isolation Panel [NHANES]    Frequency of Communication with Friends and Family: More than three times a week    Frequency of Social Gatherings with Friends and Family: Twice a week    Attends Religious Services: More than 4 times per year    Active Member of [employer] or Organizations: Yes    Attends Banker Meetings: 1 to 4 times per year    Marital Status: Married    Tobacco Counseling Counseling given: Not Answered Tobacco comments: quit smoking 74yrs ago   Clinical Intake:  Pre-visit preparation completed: Yes  Pain : 0-10 Pain Score: 8  Pain Type: Chronic pain Pain Location: Back Pain Orientation: Lower Pain Descriptors / Indicators: Aching, Spasm Pain Onset: More than a month ago Pain Frequency: Intermittent Pain Relieving Factors: medications  Pain Relieving Factors: medications  BMI - recorded: 29.99 Nutritional Status: BMI 25 -29 Overweight Nutritional Risks: None Diabetes: Yes CBG done?: Yes (BS 115 at home this morning) CBG resulted in Enter/ Edit results?: No Did pt. bring in CBG monitor from home?: No  How often do you need to have someone help you when you read instructions, pamphlets, or other written materials from your doctor or pharmacy?: 1 - Never  Interpreter Needed?: No  Comments: lives with husband Information entered by :: B.,LPN   Activities of Daily Living    03/30/2023    8:30 AM 03/20/2023    8:08 AM  In your present state of health, do you have any difficulty performing the following activities:  Hearing? 0 0  Vision? 0 0  Difficulty  concentrating or making decisions? 0 0  Walking or climbing stairs? 1 1  Dressing or bathing? 0 0  Doing errands, shopping? 1 1  Preparing Food and eating ? N   Using the Toilet? N   In the past six months, have you accidently leaked urine? N   Do you have problems with loss of bowel control? N   Managing your Medications? N   Managing your Finances? N   Housekeeping or managing your Housekeeping? N     Patient Care Team: [name], [name] as PCP - General (Nurse Practitioner) Gaspar Cola, RPH (Inactive) (Pharmacist) Sheran Luz, MD as Consulting Physician (Physical Medicine and Rehabilitation) Salena Saner, MD as Consulting Physician (Pulmonary Disease) Lyndle Herrlich, MD as Consulting Physician (Orthopedic Surgery)  Indicate any recent Medical Services you may have received from other than [facility] providers in the past year (date may be approximate).     Assessment:   This is a routine wellness examination for [facility].  Hearing/Vision screen Hearing Screening - Comments:: Adequate hearing Vision Screening - Comments:: Adequate vision with glasses [facility]  Dietary issues and exercise activities discussed:     Goals Addressed             This Visit's Progress    Monitor and Manage My Blood Sugar-Diabetes Type 2   On track    Timeframe:  Long-Range Goal Priority:  High Start Date: 03/18/21                            Expected End Date: 03/19/23  Follow Up within 30 days   - check blood sugar at prescribed times - check blood sugar if I feel it is too high or too low - enter blood sugar readings and medication or insulin into daily log    Why is this important?   Checking your blood sugar at home helps to keep it from getting very high or very low.  Writing the results in a diary or log helps the doctor know how to care for you.  Your blood sugar log should have the time, date and the results.  Also, write down the amount of  insulin or other medicine that you take.  Other information, like what you ate, exercise done and how you were feeling, will also be helpful.     Notes:      Patient Stated   On track    Patient states she would like to stay active and possibly take some courses online.      Track and Manage My Blood Pressure-Hypertension   On track    Timeframe:  Long-Range Goal Priority:  High Start Date: 03/31/22                            Expected End Date: 04/01/23                      Follow Up within 30 days   - check blood pressure 3 times per week    Why is this important?   You won't feel high blood pressure, but it can still hurt your blood vessels.  High blood pressure can cause heart or kidney problems. It can also cause a stroke.  Making lifestyle changes like losing a little weight or eating less salt will help.  Checking your blood pressure at home and at different times of the day can help to control blood pressure.  If the doctor prescribes medicine remember to take it the way the doctor ordered.  Call the office if you cannot afford the medicine or if there are questions about it.     Notes:        Depression Screen    03/30/2023    8:27 AM 03/20/2023    8:09 AM 02/10/2023    1:15 PM 01/02/2023    8:12 AM 09/20/2022   12:45 PM 09/15/2022    9:42 AM 07/11/2022    9:45 AM  PHQ 2/9 Scores  PHQ - 2 Score 0 0 0 0 0 0 0  PHQ- 9 Score  1  2 1 1      Fall Risk    03/30/2023    8:23 AM 03/20/2023    8:08 AM 01/02/2023    8:11 AM 09/20/2022   12:45 PM 09/15/2022    9:42 AM  Fall Risk   Falls in the past year?  1 1 1 1   Number falls in past yr: 0 1 1 1 1   Injury with Fall? 0 1 1 1 1   Risk for fall due to : No Fall Risks Impaired balance/gait;Impaired mobility;History of fall(s) History of fall(s);Impaired balance/gait;Impaired mobility History of fall(s) History of fall(s);Impaired balance/gait  Follow up Falls prevention discussed;Education provided Falls evaluation completed  Falls  prevention discussed;Education provided Falls evaluation completed    MEDICARE RISK AT HOME:  Medicare Risk at Home - 03/30/23 0824     Any stairs in or around the home? Yes    If so, are  there any without handrails? Yes    Home free of loose throw rugs in walkways, pet beds, electrical cords, etc? Yes    Adequate lighting in your home to reduce risk of falls? Yes    Life alert? No    Use of a cane, walker or w/c? Yes   cane   Grab bars in the bathroom? No    Shower chair or bench in shower? No    Elevated toilet seat or a handicapped toilet? No             TIMED UP AND GO:  Was the test performed?  No    Cognitive Function:        03/30/2023    8:43 AM 11/05/2019    9:34 AM 10/30/2018    9:52 AM  6CIT Screen  What Year? 0 points 0 points 0 points  What month? 0 points 0 points 0 points  What time? 0 points 0 points 0 points  Count back from 20 0 points 0 points 0 points  Months in reverse 0 points 4 points 0 points  Repeat phrase 0 points 0 points 2 points  Total Score 0 points 4 points 2 points    Immunizations Immunization History  Administered Date(s) Administered   Fluad Quad(high Dose 65+) 05/08/2019, 06/07/2021, 05/15/2022   Influenza, High Dose Seasonal PF 06/10/2015, 06/20/2016, 05/09/2017, 05/09/2018, 04/17/2020   PFIZER(Purple Top)SARS-COV-2 Vaccination 09/11/2019, 10/02/2019, 06/08/2020, 12/29/2020, 06/23/2022   PNEUMOCOCCAL CONJUGATE-20 06/07/2021   Pfizer Covid-19 Vaccine Bivalent Booster 39yrs & up 07/14/2021, 06/23/2022   Pneumococcal Conjugate-13 11/28/2013   Pneumococcal Polysaccharide-23 03/16/2018   Tdap 05/08/2019    TDAP status: Up to date  Flu Vaccine status: Up to date  Pneumococcal vaccine status: Up to date  Covid-19 vaccine status: Completed vaccines  Qualifies for Shingles Vaccine? Yes   Zostavax completed No   Shingrix Completed?: No.    Education has been provided regarding the importance of this vaccine. Patient has been  advised to call insurance company to determine out of pocket expense if they have not yet received this vaccine. Advised may also receive vaccine at local pharmacy or Health Dept. Verbalized acceptance and understanding.  Screening Tests Health Maintenance  Topic Date Due   OPHTHALMOLOGY EXAM  06/02/2021   INFLUENZA VACCINE  03/23/2023   COVID-19 Vaccine (7 - 2023-24 season) 04/05/2023 (Originally 08/18/2022)   Zoster Vaccines- Shingrix (1 of 2) 06/20/2023 (Originally 01/08/1965)   MAMMOGRAM  03/19/2024 (Originally 05/04/2018)   FOOT EXAM  05/24/2023   Diabetic kidney evaluation - Urine ACR  06/01/2023   HEMOGLOBIN A1C  07/05/2023   Diabetic kidney evaluation - eGFR measurement  01/02/2024   Medicare Annual Wellness (AWV)  03/29/2024   DTaP/Tdap/Td (2 - Td or Tdap) 05/07/2029   Pneumonia Vaccine 40+ Years old  Completed   DEXA SCAN  Completed   Hepatitis C Screening  Completed   HPV VACCINES  Aged Out   Colonoscopy  Discontinued    Health Maintenance  Health Maintenance Due  Topic Date Due   OPHTHALMOLOGY EXAM  06/02/2021   INFLUENZA VACCINE  03/23/2023    Colorectal cancer screening: No longer required.   Mammogram status: No longer required due to age.  Bone Density status: Completed yes. Results reflect: Bone density results: NORMAL. Repeat every 5 years.  Lung Cancer Screening: (Low Dose CT Chest recommended if Age 41-80 years, 20 pack-year currently smoking OR have quit w/in 15years.) does not qualify.   Lung Cancer Screening Referral: no  Additional Screening:  Hepatitis C Screening: does not qualify; Completed yes  Vision Screening: Recommended annual ophthalmology exams for early detection of glaucoma and other disorders of the eye. Is the patient up to date with their annual eye exam?  Yes  Who is the provider or what is the name of the office in which the patient attends annual eye exams? [facility] If pt is not established with a provider, would they like to  be referred to a provider to establish care? No .   Dental Screening: Recommended annual dental exams for proper oral hygiene  Diabetic Foot Exam: Diabetic Foot Exam: Completed yes  Community Resource Referral / Chronic Care Management: CRR required this visit?  No   CCM required this visit?  No     Plan:     I have personally reviewed and noted the following in the patient's chart:   Medical and social history Use of alcohol, tobacco or illicit drugs  Current medications and supplements including opioid prescriptions. Patient is not currently taking opioid prescriptions. Functional ability and status Nutritional status Physical activity Advanced directives List of other physicians Hospitalizations, surgeries, and ER visits in previous 12 months Vitals Screenings to include cognitive, depression, and falls Referrals and appointments  In addition, I have reviewed and discussed with patient certain preventive protocols, quality metrics, and best practice recommendations. A written personalized care plan for preventive services as well as general preventive health recommendations were provided to patient.     Sue Lush, LPN   08/27/1094   After Visit Summary: (MyChart) Due to this being a telephonic visit, the after visit summary with patients personalized plan was offered to patient via MyChart   Nurse Notes: Pt sts she is doing alright recovering from surgery in June. She relays she is out of Trulicity and inquires can she still receive from [facility]. She also relays she cannot get her Dexcom sensor and inquiring does it need a PA.

## 2023-03-30 NOTE — Telephone Encounter (Signed)
Patient notified

## 2023-03-31 ENCOUNTER — Other Ambulatory Visit: Payer: Self-pay | Admitting: Nurse Practitioner

## 2023-03-31 ENCOUNTER — Other Ambulatory Visit: Payer: Medicare HMO | Admitting: Pharmacist

## 2023-03-31 ENCOUNTER — Encounter: Payer: Self-pay | Admitting: Pharmacist

## 2023-03-31 DIAGNOSIS — I10 Essential (primary) hypertension: Secondary | ICD-10-CM

## 2023-03-31 DIAGNOSIS — J4540 Moderate persistent asthma, uncomplicated: Secondary | ICD-10-CM

## 2023-03-31 DIAGNOSIS — R809 Proteinuria, unspecified: Secondary | ICD-10-CM

## 2023-03-31 DIAGNOSIS — E1169 Type 2 diabetes mellitus with other specified complication: Secondary | ICD-10-CM

## 2023-03-31 DIAGNOSIS — E0829 Diabetes mellitus due to underlying condition with other diabetic kidney complication: Secondary | ICD-10-CM

## 2023-03-31 NOTE — Telephone Encounter (Signed)
Requested Prescriptions  Pending Prescriptions Disp Refills   losartan (COZAAR) 25 MG tablet [Pharmacy Med Name: LOSARTAN POTASSIUM 25 MG Tablet] 90 tablet 3    Sig: TAKE 1 TABLET EVERY DAY     Cardiovascular:  Angiotensin Receptor Blockers Failed - 03/30/2023 11:25 AM      Failed - Cr in normal range and within 180 days    Creat  Date Value Ref Range Status  01/02/2023 1.15 (H) 0.60 - 1.00 mg/dL Final   Creatinine, Urine  Date Value Ref Range Status  05/31/2022 74 20 - 275 mg/dL Final         Passed - K in normal range and within 180 days    Potassium  Date Value Ref Range Status  01/02/2023 3.9 3.5 - 5.3 mmol/L Final  11/26/2013 3.3 (L) 3.5 - 5.1 mmol/L Final         Passed - Patient is not pregnant      Passed - Last BP in normal range    BP Readings from Last 1 Encounters:  03/20/23 124/76         Passed - Valid encounter within last 6 months    Recent Outpatient Visits           1 week ago Type 2 diabetes mellitus with microalbuminuria, without long-term current use of insulin [facility])   [facility] [name], [name]   2 months ago Preoperative examination   [facility] [name], [name]   6 months ago Intractable migraine with aura without status migrainosus   [facility] [name], [name]   6 months ago Type 2 diabetes mellitus with microalbuminuria, without long-term current use of insulin [facility])   [facility] [name], [name]   8 months ago Acute viral labyrinthitis, unspecified laterality   [facility] [name], [name]

## 2023-03-31 NOTE — Patient Instructions (Signed)
Goals Addressed             This Visit's Progress    Pharmacy Goals       It was a pleasure speaking with you today!   Please watch the mail for an envelope from [employer] containing the patient assistance program application. Please complete this application and mail back to [facility] Pharmacy Technician Noreene Larsson [place] along with a copy of your Medicare Part D prescription card and a copy of your proof of income document OR you can bring these documents to office to have them faxed back to Attention: Pattricia Boss at Fax # [phone]   If you need to call Noreene Larsson, you can reach her at [phone]   If you need to reach out to patient assistance programs regarding refills or to find out the status of your application, you can do so by calling:   For [place] assistance, [place] at: [phone] For Trulicity assistance, [employer] at: [phone]   I will reach out to you again by telephone on 04/28/2023 at 10:30 am      Thank you!   Estelle Grumbles, PharmD, [facility] [phone]

## 2023-03-31 NOTE — Progress Notes (Signed)
   03/31/2023  Patient ID: Lori Potts, female   DOB: Oct 22, 1945, 77 y.o.   MRN: [id number]  Receive message from PCP requesting outreach to patient. Advises patient contacting office for help with obtaining her Trulicity. Reports that she was enrolled in a patient assistance program for this medication.  From review of chart, note patient previously working with Boneta Lucks and spoke with him on 08/24/2022 regarding re-enrollment for Trulicity patient assistance program.  Outreach to [employer] patient assistance program today on behalf of patient and speak with representative who advises patient previously enrolled in assistance for Trulicity, but that this ended 08/21/2022 and so patient would need to submit a new application.   Outreach to patient by telephone.   Patient shares that she has been off of Trulicity 0.75 mg for several months as she ran out of this medication.  Reports that she has continued to take Farxiga 5 mg daily (receiving this through [employer] patient assistance) and has also been focusing on her diet, limiting sugar and carbohydrates.  Reports recent morning fasting blood sugars ranging 98-120.  Patient requests assistance with re-enrollment in Trulicity patient assistance from [place]. Will collaborate with [facility] CPhT Pattricia Boss and PCP to aid with re-enrollment.   Follow Up Plan: Clinical Pharmacist will follow up with patient by telephone on 04/28/2023 at 10:30 am  Estelle Grumbles, PharmD, [facility] [phone]

## 2023-04-03 ENCOUNTER — Other Ambulatory Visit: Payer: Self-pay

## 2023-04-03 ENCOUNTER — Telehealth: Payer: Self-pay | Admitting: Emergency Medicine

## 2023-04-03 ENCOUNTER — Encounter: Payer: Self-pay | Admitting: Nurse Practitioner

## 2023-04-03 ENCOUNTER — Telehealth: Payer: Medicare HMO | Admitting: Nurse Practitioner

## 2023-04-03 DIAGNOSIS — H8309 Labyrinthitis, unspecified ear: Secondary | ICD-10-CM | POA: Diagnosis not present

## 2023-04-03 DIAGNOSIS — U071 COVID-19: Secondary | ICD-10-CM | POA: Diagnosis not present

## 2023-04-03 MED ORDER — METHYLPREDNISOLONE 4 MG PO TBPK
ORAL_TABLET | ORAL | 0 refills | Status: DC
Start: 2023-04-03 — End: 2023-04-28

## 2023-04-03 NOTE — Progress Notes (Signed)
Name: Lori Potts   MRN: [id number]    DOB: March 09, 1946   Date:04/03/2023       Progress Note  Subjective  Chief Complaint  Chief Complaint  Patient presents with   Covid Positive   Dizziness    I connected with  [name]  on 04/03/23 at 10:40 AM EDT by a video enabled telemedicine application and verified that I am speaking with the correct person using two identifiers.  I discussed the limitations of evaluation and management by telemedicine and the availability of in person appointments. The patient expressed understanding and agreed to proceed with a virtual visit  Staff also discussed with the patient that there may be a patient responsible charge related to this service. Patient Location: home Provider Location: [facility] Additional Individuals present: alone  HPI  Covid:  patient symptoms started on 03/30/2023. She reports cough, and nasal congestion. She tested positive for covid.  She reports she is now having some dizziness.  Will send in medrol dose pack. She has been taking tylenol.  She denies any shortness of breath or headache.   Continue flonase. Recommend taking zyrtec, flonase, mucinex, vitamin d, vitamin c, and zinc. Push fluids and get rest.     Patient Active Problem List   Diagnosis Date Noted   S/P lumbar fusion 01/30/2023   Stage 3a chronic kidney disease (HCC) 09/15/2022   OA (osteoarthritis) of hip 06/08/2022   Primary osteoarthritis of left hip 06/08/2022   Anxiety 04/07/2022   Palpitations 04/07/2022   Mild episode of recurrent major depressive disorder (HCC) 04/07/2022   Moderate persistent asthma without complication 02/16/2022   Intractable migraine with aura without status migrainosus 04/18/2020   Body mass index (BMI) 31.0-31.9, adult 02/06/2020   Chronic left hip pain 12/26/2019   Anemia 09/22/2017   S/P knee replacement 07/28/2017   Allergic rhinitis 05/26/2017   Vitamin D deficiency 05/19/2017   Chronic nonintractable headache  05/18/2017   Advanced care planning/counseling discussion    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HCC) 03/02/2017   Insomnia 11/29/2016   Abdominal wall pain in right flank 03/29/2016   Diverticulosis 02/24/2016   Type 2 diabetes mellitus with microalbuminuria, without long-term current use of insulin (HCC) 06/16/2015   Hyperlipidemia associated with type 2 diabetes mellitus (HCC) 06/16/2015   Chronic pain 04/13/2015   Neck pain 02/26/2015   Status post lumbar surgery 02/26/2015   Abnormal ECG 01/27/2015   Essential (primary) hypertension 01/27/2015    Social History   Tobacco Use   Smoking status: Former    Current packs/day: 0.00    Types: Cigarettes    Quit date: 2006    Years since quitting: 18.6   Smokeless tobacco: Never   Tobacco comments:    quit smoking 64yrs ago  Substance Use Topics   Alcohol use: No     Current Outpatient Medications:    albuterol (PROVENTIL) (2.5 MG/3ML) 0.083% nebulizer solution, Take 3 mLs (2.5 mg total) by nebulization every 6 (six) hours as needed for wheezing or shortness of breath., Disp: 150 mL, Rfl: 1   albuterol (VENTOLIN HFA) 108 (90 Base) MCG/ACT inhaler, Inhale 2 puffs into the lungs every 6 (six) hours as needed for wheezing or shortness of breath., Disp: 18 g, Rfl: 2   azelastine (ASTELIN) 0.1 % nasal spray, PLACE 1 SPRAY INTO BOTH NOSTRILS 2 (TWO) TIMES DAILY. USE IN EACH NOSTRIL AS DIRECTED (Patient taking differently: Place 1 spray into both nostrils 2 (two) times daily as needed for rhinitis or  allergies. Use in each nostril as directed), Disp: 90 mL, Rfl: 1   Budeson-Glycopyrrol-Formoterol (BREZTRI AEROSPHERE) 160-9-4.8 MCG/ACT AERO, Inhale 2 puffs into the lungs in the morning and at bedtime., Disp: , Rfl:    chlorpheniramine-HYDROcodone (TUSSIONEX) 10-8 MG/5ML, Take 5 mLs by mouth every 12 (twelve) hours as needed for cough., Disp: 115 mL, Rfl: 0   citalopram (CELEXA) 40 MG tablet, Take 1 tablet (40 mg total) by mouth daily., Disp: 90 tablet,  Rfl: 1   dapagliflozin propanediol (FARXIGA) 5 MG TABS tablet, Take 1 tablet (5 mg total) by mouth daily before breakfast., Disp: 90 tablet, Rfl: 3   docusate sodium (COLACE) 100 MG capsule, Take 100 mg by mouth 2 (two) times daily., Disp: , Rfl:    Dulaglutide (TRULICITY) 0.75 MG/0.5ML SOPN, Inject 0.75 mg into the skin once a week. Patient receives via [employer] Patient Assistance through Dec 2023, Disp: 6 mL, Rfl: 1   esomeprazole (NEXIUM) 40 MG capsule, Take 1 capsule (40 mg total) by mouth at bedtime., Disp: 90 capsule, Rfl: 1   losartan (COZAAR) 25 MG tablet, TAKE 1 TABLET EVERY DAY, Disp: 90 tablet, Rfl: 1   montelukast (SINGULAIR) 10 MG tablet, TAKE 1 TABLET BY MOUTH EVERYDAY AT BEDTIME, Disp: 90 tablet, Rfl: 3   ondansetron (ZOFRAN) 4 MG tablet, Take 1 tablet (4 mg total) by mouth every 8 (eight) hours as needed for nausea or vomiting., Disp: 20 tablet, Rfl: 0   Oxycodone HCl 10 MG TABS, Take 1 tablet 4 times a day by oral route as needed for 30 days., Disp: , Rfl:    promethazine (PHENERGAN) 12.5 MG tablet, Take 1 tablet (12.5 mg total) by mouth every 8 (eight) hours as needed for nausea or vomiting., Disp: 30 tablet, Rfl: 0   propranolol (INDERAL) 20 MG tablet, Take 1 tablet (20 mg total) by mouth 3 (three) times daily., Disp: 270 tablet, Rfl: 1   rosuvastatin (CRESTOR) 40 MG tablet, Take 1 tablet (40 mg total) by mouth daily., Disp: 90 tablet, Rfl: 1   Continuous Glucose Sensor (DEXCOM G7 SENSOR) MISC, Change every 10 days (Patient not taking: Reported on 04/03/2023), Disp: 3 each, Rfl: 5  Allergies  Allergen Reactions   Metformin And Related Diarrhea   Adhesive [Tape] Rash and Other (See Comments)    Regular tape is ok, allergy is to paper tape    I personally reviewed active problem list, medication list, allergies with the patient/caregiver today.  ROS  Constitutional: Negative for fever or weight change.  Respiratory: Negative for cough and shortness of breath.    Cardiovascular: Negative for chest pain or palpitations.  Gastrointestinal: Negative for abdominal pain, no bowel changes.  Musculoskeletal: Negative for gait problem or joint swelling.  Skin: Negative for rash.  Neurological: positive for dizziness, negative headache.  No other specific complaints in a complete review of systems (except as listed in HPI above).   Objective  Virtual encounter, vitals not obtained.  There is no height or weight on file to calculate BMI.  Nursing Note and Vital Signs reviewed.  Physical Exam  Awake, alert and oriented, speaking in complete sentences  No results found for this or any previous visit (from the past 72 hour(s)).  Assessment & Plan  1. Acute viral labyrinthitis, unspecified laterality   Continue flonase. Recommend taking zyrtec, flonase, mucinex, vitamin d, vitamin c, and zinc. Push fluids and get rest.    - methylPREDNISolone (MEDROL DOSEPAK) 4 MG TBPK tablet; Take as directed  Dispense: 21  tablet; Refill: 0  2. COVID-19    Continue flonase. Recommend taking zyrtec, flonase, mucinex, vitamin d, vitamin c, and zinc. Push fluids and get rest.     -Red flags and when to present for emergency care or RTC including fever >101.15F, chest pain, shortness of breath, new/worsening/un-resolving symptoms,  reviewed with patient at time of visit. Follow up and care instructions discussed and provided in AVS. - I discussed the assessment and treatment plan with the patient. The patient was provided an opportunity to ask questions and all were answered. The patient agreed with the plan and demonstrated an understanding of the instructions.  I provided 15 minutes of non-face-to-face time during this encounter.  [name], [name]

## 2023-04-03 NOTE — Telephone Encounter (Signed)
Patient called and has tested positive for Covid and has vertigo. Do you need her to do a virtual appointment or can something be called in

## 2023-04-03 NOTE — Telephone Encounter (Signed)
Pt did a virtual today with julie

## 2023-04-04 DIAGNOSIS — J45909 Unspecified asthma, uncomplicated: Secondary | ICD-10-CM | POA: Diagnosis not present

## 2023-04-04 DIAGNOSIS — F419 Anxiety disorder, unspecified: Secondary | ICD-10-CM | POA: Diagnosis not present

## 2023-04-04 DIAGNOSIS — I700 Atherosclerosis of aorta: Secondary | ICD-10-CM | POA: Diagnosis not present

## 2023-04-04 DIAGNOSIS — I10 Essential (primary) hypertension: Secondary | ICD-10-CM | POA: Diagnosis not present

## 2023-04-04 DIAGNOSIS — M5136 Other intervertebral disc degeneration, lumbar region: Secondary | ICD-10-CM | POA: Diagnosis not present

## 2023-04-04 DIAGNOSIS — J302 Other seasonal allergic rhinitis: Secondary | ICD-10-CM | POA: Diagnosis not present

## 2023-04-04 DIAGNOSIS — Z4789 Encounter for other orthopedic aftercare: Secondary | ICD-10-CM | POA: Diagnosis not present

## 2023-04-04 DIAGNOSIS — M48061 Spinal stenosis, lumbar region without neurogenic claudication: Secondary | ICD-10-CM | POA: Diagnosis not present

## 2023-04-04 DIAGNOSIS — E119 Type 2 diabetes mellitus without complications: Secondary | ICD-10-CM | POA: Diagnosis not present

## 2023-04-05 ENCOUNTER — Telehealth: Payer: Medicare HMO | Admitting: Nurse Practitioner

## 2023-04-06 ENCOUNTER — Telehealth: Payer: Self-pay | Admitting: Pharmacy Technician

## 2023-04-06 ENCOUNTER — Other Ambulatory Visit: Payer: Self-pay | Admitting: Nurse Practitioner

## 2023-04-06 DIAGNOSIS — I10 Essential (primary) hypertension: Secondary | ICD-10-CM

## 2023-04-06 DIAGNOSIS — Z5986 Financial insecurity: Secondary | ICD-10-CM

## 2023-04-06 DIAGNOSIS — E7800 Pure hypercholesterolemia, unspecified: Secondary | ICD-10-CM

## 2023-04-06 DIAGNOSIS — G43119 Migraine with aura, intractable, without status migrainosus: Secondary | ICD-10-CM

## 2023-04-06 NOTE — Progress Notes (Signed)
[facility] Customer service manager [facility])                                            [facility] Team    04/06/2023  MEHREEN GUAMAN 1946-05-06 [id number]                                      Medication Assistance Referral  Referral From:  [facility] PharmD Estelle Grumbles  Medication/Company: Trudee Kuster / Julious Oka Patient application portion:  Mailed Provider application portion: Faxed  to [name], [name] Provider address/fax verified via: Office website   Pattricia Boss, CPhT [facility]  [facility] Office: [phone] Fax: [phone] Email: [email or web address]

## 2023-04-07 NOTE — Telephone Encounter (Signed)
Requested Prescriptions  Pending Prescriptions Disp Refills   Alcohol Swabs (DROPSAFE ALCOHOL PREP) 70 % PADS [Pharmacy Med Name: DROPSAFE ALCOHOL PREP PADS 70 % Pad] 200 each 3    Sig: USE TO TEST BLOOD SUGAR TWO TIMES DAILY     Off-Protocol Failed - 04/06/2023  3:28 AM      Failed - Medication not assigned to a protocol, review manually.      Passed - Valid encounter within last 12 months    Recent Outpatient Visits           4 days ago Acute viral labyrinthitis, unspecified laterality   [facility] [name], [name]   2 weeks ago Type 2 diabetes mellitus with microalbuminuria, without long-term current use of insulin [facility])   [place] [facility] [name], [name]   3 months ago Preoperative examination   [facility] [name], [name]   6 months ago Intractable migraine with aura without status migrainosus   [facility] [name], [name]   6 months ago Type 2 diabetes mellitus with microalbuminuria, without long-term current use of insulin [facility])   [place] [facility] [name], [name]               TRUEplus Lancets 33G MISC [Pharmacy Med Name: TRUEPLUS LANCETS 33G] 200 each 3    Sig: TEST BLOOD SUGAR TWO TIMES DAILY     Endocrinology: Diabetes - Testing Supplies Passed - 04/06/2023  3:28 AM      Passed - Valid encounter within last 12 months    Recent Outpatient Visits           4 days ago Acute viral labyrinthitis, unspecified laterality   [facility] [name], [name]   2 weeks ago Type 2 diabetes mellitus with microalbuminuria, without long-term current use of insulin [facility])   [place] [facility] [name], [name]   3 months ago Preoperative examination   [facility] [name], [name]   6 months ago Intractable migraine with aura without status migrainosus    [facility] [name], [name]   6 months ago Type 2 diabetes mellitus with microalbuminuria, without long-term current use of insulin [facility])   [place] [facility] [name], [name]               rosuvastatin (CRESTOR) 40 MG tablet [Pharmacy Med Name: ROSUVASTATIN CALCIUM 40 MG Tablet] 90 tablet 1    Sig: TAKE 1 TABLET EVERY DAY     Cardiovascular:  Antilipid - Statins 2 Failed - 04/06/2023  3:28 AM      Failed - Cr in normal range and within 360 days    Creat  Date Value Ref Range Status  01/02/2023 1.15 (H) 0.60 - 1.00 mg/dL Final   Creatinine, Urine  Date Value Ref Range Status  05/31/2022 74 20 - 275 mg/dL Final         Failed - Lipid Panel in normal range within the last 12 months    Cholesterol  Date Value Ref Range Status  09/15/2022 156 <200 mg/dL Final   LDL Cholesterol (Calc)  Date Value Ref Range Status  09/15/2022 81 mg/dL (calc) Final    Comment:    Reference range: <100 . Desirable range <100 mg/dL for primary prevention;   <70 mg/dL for patients with  CHD or diabetic patients  with > or = 2 CHD risk factors. Marland Kitchen LDL-C is now calculated using the Martin-Hopkins  calculation, which is a validated novel method providing  better accuracy than the Friedewald equation in the  estimation of LDL-C.  Horald Pollen et al. Lenox Ahr. 3295;188(41): 2061-2068  (http://education.[employer].com/faq/FAQ164)    HDL  Date Value Ref Range Status  09/15/2022 58 > OR = 50 mg/dL Final   Triglycerides  Date Value Ref Range Status  09/15/2022 91 <150 mg/dL Final         Passed - Patient is not pregnant      Passed - Valid encounter within last 12 months    Recent Outpatient Visits           4 days ago Acute viral labyrinthitis, unspecified laterality   [facility] [name], [name]   2 weeks ago Type 2 diabetes mellitus with microalbuminuria, without long-term current use of insulin [facility])    [place] [facility] [name], [name]   3 months ago Preoperative examination   [facility] [name], [name]   6 months ago Intractable migraine with aura without status migrainosus   [facility] [name], [name]   6 months ago Type 2 diabetes mellitus with microalbuminuria, without long-term current use of insulin [facility])   [facility] [name], [name]               propranolol (INDERAL) 20 MG tablet [Pharmacy Med Name: PROPRANOLOL HYDROCHLORIDE 20 MG Tablet] 270 tablet 1    Sig: TAKE 1 TABLET THREE TIMES DAILY     Cardiovascular:  Beta Blockers Passed - 04/06/2023  3:28 AM      Passed - Last BP in normal range    BP Readings from Last 1 Encounters:  03/20/23 124/76         Passed - Last Heart Rate in normal range    Pulse Readings from Last 1 Encounters:  03/20/23 77         Passed - Valid encounter within last 6 months    Recent Outpatient Visits           4 days ago Acute viral labyrinthitis, unspecified laterality   [facility] [name], [name]   2 weeks ago Type 2 diabetes mellitus with microalbuminuria, without long-term current use of insulin [facility])   [facility] [name], [name]   3 months ago Preoperative examination   [facility] [name], [name]   6 months ago Intractable migraine with aura without status migrainosus   [facility] [name], [name]   6 months ago Type 2 diabetes mellitus with microalbuminuria, without long-term current use of insulin [facility])   [place] [facility] [name], [name]               esomeprazole (NEXIUM) 40 MG capsule [Pharmacy Med Name: ESOMEPRAZOLE MAGNESIUM 40 MG Capsule Delayed Release] 90 capsule 1    Sig: TAKE 1 CAPSULE AT BEDTIME     Gastroenterology: Proton Pump  Inhibitors 2 Passed - 04/06/2023  3:28 AM      Passed - ALT in normal range and within 360 days    ALT  Date Value Ref Range Status  01/02/2023 10 6 - 29 U/L Final   SGPT (ALT)  Date Value Ref Range Status  11/26/2013 26 12 - 78 U/L Final         Passed - AST in normal range and within 360 days    AST  Date Value Ref Range Status  01/02/2023 16 10 - 35 U/L Final   SGOT(AST)  Date Value Ref Range Status  11/26/2013 15 15 - 37 Unit/L Final         Passed - Valid encounter within last 12 months    Recent Outpatient Visits           4 days ago Acute viral labyrinthitis, unspecified laterality   [facility] [name], [name]   2 weeks ago Type 2 diabetes mellitus with microalbuminuria, without long-term current use of insulin [facility])   [place] [facility] [name], [name]   3 months ago Preoperative examination   [facility] [name], [name]   6 months ago Intractable migraine with aura without status migrainosus   [facility] [name], [name]   6 months ago Type 2 diabetes mellitus with microalbuminuria, without long-term current use of insulin [facility])   [facility] [name], [name]

## 2023-04-07 NOTE — Telephone Encounter (Signed)
Requested medications are due for refill today.  yes  Requested medications are on the active medications list.  no  Last refill. unsure  Future visit scheduled.   no  Notes to clinic.  No orders for these meds.    Requested Prescriptions  Pending Prescriptions Disp Refills   Alcohol Swabs (DROPSAFE ALCOHOL PREP) 70 % PADS [Pharmacy Med Name: DROPSAFE ALCOHOL PREP PADS 70 % Pad] 200 each 3    Sig: USE TO TEST BLOOD SUGAR TWO TIMES DAILY     Off-Protocol Failed - 04/06/2023  3:28 AM      Failed - Medication not assigned to a protocol, review manually.      Passed - Valid encounter within last 12 months    Recent Outpatient Visits           4 days ago Acute viral labyrinthitis, unspecified laterality   [facility] [name], [name]   2 weeks ago Type 2 diabetes mellitus with microalbuminuria, without long-term current use of insulin [facility])   [facility] [name], [name]   3 months ago Preoperative examination   [facility] [name], [name]   6 months ago Intractable migraine with aura without status migrainosus   [facility] [name], [name]   6 months ago Type 2 diabetes mellitus with microalbuminuria, without long-term current use of insulin [facility])   [facility] [name], [name]               TRUEplus Lancets 33G MISC [Pharmacy Med Name: TRUEPLUS LANCETS 33G] 200 each 3    Sig: TEST BLOOD SUGAR TWO TIMES DAILY     Endocrinology: Diabetes - Testing Supplies Passed - 04/06/2023  3:28 AM      Passed - Valid encounter within last 12 months    Recent Outpatient Visits           4 days ago Acute viral labyrinthitis, unspecified laterality   [facility] [name], [name]   2 weeks ago Type 2 diabetes mellitus with microalbuminuria, without long-term current use of insulin [facility])   [facility] [name], [name]   3 months ago Preoperative examination   [facility] [name], [name]   6 months ago Intractable migraine with aura without status migrainosus   [name], [name]   6 months ago Type 2 diabetes mellitus with microalbuminuria, without long-term current use of insulin [facility])   [facility] [name], [name]              Signed Prescriptions Disp Refills   rosuvastatin (CRESTOR) 40 MG tablet 90 tablet 1    Sig: TAKE 1 TABLET EVERY DAY     Cardiovascular:  Antilipid - Statins 2 Failed - 04/06/2023  3:28 AM      Failed - Cr in normal range and within 360 days    Creat  Date Value Ref Range Status  01/02/2023 1.15 (H) 0.60 - 1.00 mg/dL Final   Creatinine, Urine  Date Value Ref Range Status  05/31/2022 74 20 - 275 mg/dL Final         Failed - Lipid Panel in normal range within the last 12 months    Cholesterol  Date Value Ref Range Status  09/15/2022 156 <200 mg/dL Final  LDL Cholesterol (Calc)  Date Value Ref Range Status  09/15/2022 81 mg/dL (calc) Final    Comment:    Reference range: <100 . Desirable range <100 mg/dL for primary prevention;   <70 mg/dL for patients with CHD or diabetic patients  with > or = 2 CHD risk factors. Marland Kitchen LDL-C is now calculated using the Martin-Hopkins  calculation, which is a validated novel method providing  better accuracy than the Friedewald equation in the  estimation of LDL-C.  Horald Pollen et al. Lenox Ahr. 0981;191(47): 2061-2068  (http://education.Quest[employer].com/faq/FAQ164)    HDL  Date Value Ref Range Status  09/15/2022 58 > OR = 50 mg/dL Final   Triglycerides  Date Value Ref Range Status  09/15/2022 91 <150 mg/dL Final         Passed - Patient is not pregnant      Passed - Valid encounter within last 12 months    Recent Outpatient Visits           4 days ago Acute viral  labyrinthitis, unspecified laterality   [facility] [name], [name]   2 weeks ago Type 2 diabetes mellitus with microalbuminuria, without long-term current use of insulin [facility])   [facility] [name], [name]   3 months ago Preoperative examination   [facility] [name], [name]   6 months ago Intractable migraine with aura without status migrainosus   [facility] [name], [name]   6 months ago Type 2 diabetes mellitus with microalbuminuria, without long-term current use of insulin [facility])   [facility] [name], [name]               propranolol (INDERAL) 20 MG tablet 270 tablet 1    Sig: TAKE 1 TABLET THREE TIMES DAILY     Cardiovascular:  Beta Blockers Passed - 04/06/2023  3:28 AM      Passed - Last BP in normal range    BP Readings from Last 1 Encounters:  03/20/23 124/76         Passed - Last Heart Rate in normal range    Pulse Readings from Last 1 Encounters:  03/20/23 77         Passed - Valid encounter within last 6 months    Recent Outpatient Visits           4 days ago Acute viral labyrinthitis, unspecified laterality   [facility] [name], [name]   2 weeks ago Type 2 diabetes mellitus with microalbuminuria, without long-term current use of insulin [facility])   [facility] [name], [name]   3 months ago Preoperative examination   [facility] [name], [name]   6 months ago Intractable migraine with aura without status migrainosus   [facility] [name], [name]   6 months ago Type 2 diabetes mellitus with microalbuminuria, without long-term current use of insulin [facility])   [facility] [name], [name]               esomeprazole (NEXIUM) 40 MG capsule 90  capsule 1    Sig: TAKE 1 CAPSULE AT BEDTIME     Gastroenterology: Proton Pump Inhibitors 2 Passed - 04/06/2023  3:28 AM      Passed - ALT in normal range and within 360 days  ALT  Date Value Ref Range Status  01/02/2023 10 6 - 29 U/L Final   SGPT (ALT)  Date Value Ref Range Status  11/26/2013 26 12 - 78 U/L Final         Passed - AST in normal range and within 360 days    AST  Date Value Ref Range Status  01/02/2023 16 10 - 35 U/L Final   SGOT(AST)  Date Value Ref Range Status  11/26/2013 15 15 - 37 Unit/L Final         Passed - Valid encounter within last 12 months    Recent Outpatient Visits           4 days ago Acute viral labyrinthitis, unspecified laterality   [facility] [name], [name]   2 weeks ago Type 2 diabetes mellitus with microalbuminuria, without long-term current use of insulin [facility])   [facility] [name], [name]   3 months ago Preoperative examination   [facility] [name], [name]   6 months ago Intractable migraine with aura without status migrainosus   [facility] [name], [name]   6 months ago Type 2 diabetes mellitus with microalbuminuria, without long-term current use of insulin [facility])   [facility] [name], [name]

## 2023-04-11 ENCOUNTER — Other Ambulatory Visit: Payer: Self-pay | Admitting: Nurse Practitioner

## 2023-04-11 DIAGNOSIS — F419 Anxiety disorder, unspecified: Secondary | ICD-10-CM

## 2023-04-11 NOTE — Telephone Encounter (Signed)
Medication Refill - Medication: citalopram (CELEXA) 40 MG tablet   Pharmacy calling requesting new Rx be sent.   Has the patient contacted their pharmacy? Yes.    (Agent: If yes, when and what did the pharmacy advise?)  Preferred Pharmacy (with phone number or street name):  [facility] - [place], [place] - [address]  [address] [place] [address]  Phone: [phone] Fax: [phone]  Hours: Not open 24 hours   Has the patient been seen for an appointment in the last year OR does the patient have an upcoming appointment? Yes.    Agent: Please be advised that RX refills may take up to 3 business days. We ask that you follow-up with your pharmacy.

## 2023-04-12 MED ORDER — CITALOPRAM HYDROBROMIDE 40 MG PO TABS: 40.0000 mg | ORAL_TABLET | Freq: Every day | ORAL | 1 refills | Status: DC

## 2023-04-28 ENCOUNTER — Telehealth: Payer: Self-pay | Admitting: Pharmacist

## 2023-04-28 ENCOUNTER — Other Ambulatory Visit: Payer: Medicare HMO | Admitting: Pharmacist

## 2023-04-28 ENCOUNTER — Telehealth: Payer: Self-pay | Admitting: Pharmacy Technician

## 2023-04-28 DIAGNOSIS — Z5986 Financial insecurity: Secondary | ICD-10-CM

## 2023-04-28 NOTE — Progress Notes (Signed)
04/28/2023 Name: Lori Potts MRN: [id number] DOB: 1946/06/10  Chief Complaint  Patient presents with   Medication Management   Medication Assistance    Lori Potts is a 77 y.o. year old female who presented for a telephone visit.   They were referred to the pharmacist by their PCP for assistance in managing medication access.    Subjective:  Care Team: Primary Care Provider: [name], [name] Cardiologist: Mirian Capuchin, MD  Medication Access/Adherence  [facility]  [facility] [address] [place] - [address] [place] [address] Phone: [phone] Fax: [phone]  [facility] - [place], [place] - [address] [place] [address] Phone: [phone] Fax: [phone]  [facility] - [place], [place] - [address] [place] [address] Phone: [phone] Fax: [phone]  [id number] [place], [place] - [address] [place] [address] Phone: [phone] Fax: [phone]  [facility] - [place], [place] - [address] [place] [address] [place] [address] Phone: [phone] Fax: [phone]   Patient reports affordability concerns with their medications: Yes  Patient reports access/transportation concerns to their pharmacy: No  Patient reports adherence concerns with their medications:  No     Diabetes:  Current medications:  - Farxiga 5 mg daily  Medications tried in the past: Trulicity - not currently taking as enrollment in [place] assistance program lapsed  Current glucose readings: before breakfast ranging 110s-120  Patient denies hypoglycemic s/sx including dizziness, shakiness, sweating.    Statin therapy: rosuvastatin 40 mg daily  Current medication access support:  Per patient, enrolled in patient assistance for Farxiga through [place] Collaborating with PCP and [facility] CPhT to  apply for re-enrollment in patient assistance program for Trulicity from [place] - From review of chart, note CPhT Noreene Larsson Simcox mailed application to patient on 8/15, but today patient denies having yet received this.    Hypertension:  Current medications:  - losartan 25 mg daily - propranolol 20 mg three times daily  Patient has a validated, automated, upper arm home BP cuff Current blood pressure readings readings: last checked last week, but does not recall reading  Patient recent denies hypotensive s/sx including dizziness, lightheadedness.   Current physical activity: movement limited by hip pain, but stays active throughout the day   Asthma and Allergic Rhinitis:  Current medications: - Albuterol HFA 2 puffs every 6 hours as needed  - Breztri inhaler - 2 puffs twice daily - Montelukast 10 mg nightly  - Azelastine nasal spray twice daily as needed  Medications tried in the past: Breo, Trelegy (cost)  Current medication access support: none. Reports cost of Breztri inhaler is difficult to afford   Objective:  Lab Results  Component Value Date   HGBA1C 7.8 (H) 01/02/2023    Lab Results  Component Value Date   CREATININE 1.15 (H) 01/02/2023   BUN 20 01/02/2023   NA 141 01/02/2023   K 3.9 01/02/2023   CL 105 01/02/2023   CO2 26 01/02/2023    Lab Results  Component Value Date   CHOL 156 09/15/2022   HDL 58 09/15/2022   LDLCALC 81 09/15/2022   TRIG 91 09/15/2022   CHOLHDL 2.7 09/15/2022   BP Readings from Last 3 Encounters:  03/20/23 124/76  02/02/23 (!) 129/56  01/20/23 134/67   Pulse Readings from Last 3  Encounters:  03/20/23 77  02/02/23 61  01/20/23 63    Medications Reviewed Today     Reviewed by Manuela Neptune, RPH-CPP (Pharmacist) on 04/28/23 at 1044  Med List Status: <None>   Medication Order Taking? Sig Documenting Provider Last Dose Status Informant  albuterol (PROVENTIL) (2.5 MG/3ML) 0.083% nebulizer solution [id number] Yes Take 3 mLs  (2.5 mg total) by nebulization every 6 (six) hours as needed for wheezing or shortness of breath. [name], [name] Taking Active Self  albuterol (VENTOLIN HFA) 108 (90 Base) MCG/ACT inhaler [id number] Yes Inhale 2 puffs into the lungs every 6 (six) hours as needed for wheezing or shortness of breath. [name], [name] Taking Active Self  Alcohol Swabs (DROPSAFE ALCOHOL PREP) 70 % PADS [id number]  USE TO TEST BLOOD SUGAR TWO TIMES DAILY [name], [name]  Active   azelastine (ASTELIN) 0.1 % nasal spray [id number] Yes PLACE 1 SPRAY INTO BOTH NOSTRILS 2 (TWO) TIMES DAILY. USE IN EACH NOSTRIL AS DIRECTED  Patient taking differently: Place 1 spray into both nostrils 2 (two) times daily as needed for rhinitis or allergies. Use in each nostril as directed   Alba Cory, MD Taking Active Self  Budeson-Glycopyrrol-Formoterol (BREZTRI AEROSPHERE) 160-9-4.8 MCG/ACT AERO [id number] Yes Inhale 2 puffs into the lungs in the morning and at bedtime. [provider] Taking Active Self  busPIRone (BUSPAR) 5 MG tablet [id number] Yes Take 5 mg by mouth 3 (three) times daily. [provider] Taking Active            Med Note Ronney Asters, [facility]   Fri Apr 28, 2023 10:36 AM) Takes 1 tablet daily each morning  citalopram (CELEXA) 40 MG tablet [id number] Yes Take 1 tablet (40 mg total) by mouth daily. [name], [name] Taking Active   dapagliflozin propanediol (FARXIGA) 5 MG TABS tablet [id number] Yes Take 1 tablet (5 mg total) by mouth daily before breakfast. [name], [name] Taking Active Self  docusate sodium (COLACE) 100 MG capsule [id number] Yes Take 100 mg by mouth 2 (two) times daily. [provider] Taking Active Self  Dulaglutide (TRULICITY) 0.75 MG/0.5ML SOPN [id number]  Inject 0.75 mg into the skin once a week. Patient receives via [employer] Patient Assistance through Dec 2023 [name], [name]  Active   esomeprazole (NEXIUM) 40 MG capsule [id number] Yes TAKE 1 CAPSULE AT  BEDTIME [name], [name] Taking Active   losartan (COZAAR) 25 MG tablet [id number] Yes TAKE 1 TABLET EVERY DAY [name], [name] Taking Active   montelukast (SINGULAIR) 10 MG tablet [id number] Yes TAKE 1 TABLET BY MOUTH EVERYDAY AT BEDTIME [name], [name] Taking Active Self  Oxycodone HCl 10 MG TABS [id number] Yes Take 1 tablet 4 times a day by oral route as needed for 30 days. [provider] Taking Active   propranolol (INDERAL) 20 MG tablet [id number] Yes TAKE 1 TABLET THREE TIMES DAILY [name], [name] Taking Active   rosuvastatin (CRESTOR) 40 MG tablet [id number] Yes TAKE 1 TABLET EVERY DAY [name], [name] Taking Active   TRUEplus Lancets 33G MISC [id number]  TEST BLOOD SUGAR TWO TIMES DAILY [name], [name]  Active               Assessment/Plan:   Comprehensive medication review performed; medication list updated in electronic medical record   Diabetes: - Reviewed long term cardiovascular and renal outcomes of uncontrolled blood sugar - Reviewed goal A1c, goal fasting, and goal  2 hour post prandial glucose - Reviewed dietary modifications including importance of having regular well-balanced meals while controlling carbohydrate portion sizes - Recommend to check glucose, keep log of results and have this record to review during future medical appointments - Collaborating with PCP and CPhT to aid patient with applying for re-enrollment in patient assistance program for Trulicity from [place] Patient requests to pick up copy of application from office. Collaborate with CPhT Noreene Larsson Simcox and CMA Freada Bergeron. Patient will pick up application from office on Monday, complete and then have this faxed back to CPhT along with her proof of income document   Hypertension: - Reviewed long term cardiovascular and renal outcomes of uncontrolled blood pressure - Recommended to check home blood pressure and heart rate, keep log of results and have this record to review  during future medical appointments   Asthma: - Reviewed appropriate inhaler technique. - Meets financial criteria for [employer] patient assistance program through [place]. Will collaborate with provider, CPhT, and patient to pursue assistance.    Follow Up Plan: Clinical Pharmacist will follow up with patient by telephone again next month  Estelle Grumbles, PharmD, [facility] [phone]

## 2023-04-28 NOTE — Progress Notes (Unsigned)
   Outreach Note  04/28/2023 Name: TWYLIA HALL MRN: [id number] DOB: 1946/02/12  Referred by: [name], [name]  Was unable to reach patient via telephone today and have left HIPAA compliant voicemail asking patient to return my call.    Follow Up Plan: Will collaborate with Care Guide to outreach to schedule follow up with me  Estelle Grumbles, PharmD, [facility] [phone]

## 2023-04-28 NOTE — Patient Instructions (Signed)
Goals Addressed             This Visit's Progress    Pharmacy Goals       It was a pleasure speaking with you today!   Please complete the patient assistance program applications and mail back to [facility] Pharmacy Technician Noreene Larsson Simcox along with a copy of your Medicare Part D prescription card and a copy of your proof of income document OR you can bring these documents to office to have them faxed back to Attention: Pattricia Boss at Fax # [phone]   If you need to call Noreene Larsson, you can reach her at [phone]   If you need to reach out to patient assistance programs regarding refills or to find out the status of your application, you can do so by calling:   For [place] assistance, [employer] at: [phone] For Trulicity assistance, [employer] at: [phone]     Thank you!   Estelle Grumbles, PharmD, [facility] [phone]

## 2023-04-28 NOTE — Progress Notes (Signed)
[facility] Customer service manager [facility])                                            [facility] Quality Pharmacy Team    04/28/2023  [name] Oct 09, 1945 [id number]                                      Medication Assistance Referral  Referral From:  [facility] PharmD Estelle Grumbles  Medication/Company: [employer]us [employer] / [place] Patient application portion:  Mailed Provider application portion: Faxed  to [name], NP Provider address/fax verified via: Office website   Pattricia Boss, CPhT [employer]  Office: [phone] Fax: [phone] Email: [email or web address]

## 2023-05-03 DIAGNOSIS — M25551 Pain in right hip: Secondary | ICD-10-CM | POA: Diagnosis not present

## 2023-05-08 ENCOUNTER — Ambulatory Visit (INDEPENDENT_AMBULATORY_CARE_PROVIDER_SITE_OTHER): Payer: Medicare HMO

## 2023-05-08 DIAGNOSIS — Z23 Encounter for immunization: Secondary | ICD-10-CM | POA: Diagnosis not present

## 2023-05-10 ENCOUNTER — Other Ambulatory Visit: Payer: Self-pay | Admitting: Nurse Practitioner

## 2023-05-11 ENCOUNTER — Encounter: Payer: Self-pay | Admitting: Nurse Practitioner

## 2023-05-11 ENCOUNTER — Other Ambulatory Visit: Payer: Self-pay | Admitting: Nurse Practitioner

## 2023-05-11 ENCOUNTER — Telehealth: Payer: Self-pay | Admitting: Emergency Medicine

## 2023-05-11 ENCOUNTER — Other Ambulatory Visit: Payer: Self-pay

## 2023-05-11 ENCOUNTER — Ambulatory Visit (INDEPENDENT_AMBULATORY_CARE_PROVIDER_SITE_OTHER): Payer: Medicare HMO | Admitting: Nurse Practitioner

## 2023-05-11 VITALS — BP 124/72 | HR 75 | Temp 97.9°F | Resp 16 | Ht 63.5 in | Wt 171.9 lb

## 2023-05-11 DIAGNOSIS — E1129 Type 2 diabetes mellitus with other diabetic kidney complication: Secondary | ICD-10-CM

## 2023-05-11 DIAGNOSIS — N1831 Chronic kidney disease, stage 3a: Secondary | ICD-10-CM

## 2023-05-11 DIAGNOSIS — G43119 Migraine with aura, intractable, without status migrainosus: Secondary | ICD-10-CM | POA: Diagnosis not present

## 2023-05-11 DIAGNOSIS — R809 Proteinuria, unspecified: Secondary | ICD-10-CM

## 2023-05-11 MED ORDER — ONDANSETRON 4 MG PO TBDP
4.0000 mg | ORAL_TABLET | Freq: Three times a day (TID) | ORAL | 0 refills | Status: DC | PRN
Start: 2023-05-11 — End: 2023-07-31

## 2023-05-11 MED ORDER — BUTALBITAL-APAP-CAFFEINE 50-325-40 MG PO TABS: 1.0000 | ORAL_TABLET | Freq: Four times a day (QID) | ORAL | 0 refills | Status: DC | PRN

## 2023-05-11 MED ORDER — KETOROLAC TROMETHAMINE 60 MG/2ML IM SOLN
30.0000 mg | Freq: Once | INTRAMUSCULAR | Status: AC
Start: 2023-05-11 — End: 2023-05-11
  Administered 2023-05-11: 30 mg via INTRAMUSCULAR

## 2023-05-11 MED ORDER — PROMETHAZINE HCL 25 MG/ML IJ SOLN
25.0000 mg | Freq: Once | INTRAMUSCULAR | Status: AC
Start: 2023-05-11 — End: 2023-05-11
  Administered 2023-05-11: 25 mg via INTRAMUSCULAR

## 2023-05-11 MED ORDER — DIPHENHYDRAMINE HCL 50 MG/ML IJ SOLN
25.0000 mg | Freq: Once | INTRAMUSCULAR | Status: AC
Start: 2023-05-11 — End: 2023-05-11
  Administered 2023-05-11: 25 mg via INTRAMUSCULAR

## 2023-05-11 MED ORDER — DAPAGLIFLOZIN PROPANEDIOL 5 MG PO TABS: 5.0000 mg | ORAL_TABLET | Freq: Every day | ORAL | 3 refills | Status: DC

## 2023-05-11 NOTE — Progress Notes (Signed)
BP 124/72   Pulse 75   Temp 97.9 F (36.6 C) (Oral)   Resp 16   Ht 5' 3.5" (1.613 m)   Wt 171 lb 14.4 oz (78 kg)   SpO2 97%   BMI 29.97 kg/m    Subjective:    Patient ID: Lori Potts, female    DOB: Jul 03, 1946, 77 y.o.   MRN: [id number]  HPI: Lori Potts is a 77 y.o. female  Chief Complaint  Patient presents with   Migraine   Migraine: patient reports that she has had a typical migraine since yesterday. She says that she has not had a migraine in awhile.  She says she did take some tylenol which did not help.   She reports she has been taking her propanolol 20 mg three times a day. She has previously been on abortive therapy but reports that it has not worked for her in the past.  Have tried Merchant navy officer, imitrex, Scientist, clinical (histocompatibility and immunogenetics), [place]. She has not seen her neurologist for years. Will place referral to get reestablished.  She reports some nausea and vomited yesterday. She would like to have a migraine cocktail. Patient last GFR was low so will only give one dose of Toradol 30 mg IM, phenergan and benadryl. Will send in prescription for fioricet.   Relevant past medical, surgical, family and social history reviewed and updated as indicated. Interim medical history since our last visit reviewed. Allergies and medications reviewed and updated.  Review of Systems  Ten systems reviewed and is negative except as mentioned in HPI       Objective:    BP 124/72   Pulse 75   Temp 97.9 F (36.6 C) (Oral)   Resp 16   Ht 5' 3.5" (1.613 m)   Wt 171 lb 14.4 oz (78 kg)   SpO2 97%   BMI 29.97 kg/m   Wt Readings from Last 3 Encounters:  05/11/23 171 lb 14.4 oz (78 kg)  03/30/23 172 lb (78 kg)  03/20/23 172 lb 12.8 oz (78.4 kg)    Physical Exam  Constitutional: Patient appears well-developed and well-nourished.  No distress.  HEENT: head atraumatic, normocephalic, pupils equal and reactive to light, neck supple, throat within normal limits Cardiovascular: Normal rate, regular  rhythm and normal heart sounds.  No murmur heard. No BLE edema. Pulmonary/Chest: Effort normal and breath sounds normal. No respiratory distress. Abdominal: Soft.  There is no tenderness. Neuro:  equal grip, normal gait,  equal sensation Psychiatric: Patient has a normal mood and affect. behavior is normal. Judgment and thought content normal.  Results for orders placed or performed during the hospital encounter of 01/30/23  Glucose, capillary  Result Value Ref Range   Glucose-Capillary 109 (H) 70 - 99 mg/dL  Glucose, capillary  Result Value Ref Range   Glucose-Capillary 215 (H) 70 - 99 mg/dL  Glucose, capillary  Result Value Ref Range   Glucose-Capillary 190 (H) 70 - 99 mg/dL  Glucose, capillary  Result Value Ref Range   Glucose-Capillary 157 (H) 70 - 99 mg/dL   Comment 1 Notify RN    Comment 2 Document in Chart   Glucose, capillary  Result Value Ref Range   Glucose-Capillary 171 (H) 70 - 99 mg/dL   Comment 1 Notify RN    Comment 2 Document in Chart   Glucose, capillary  Result Value Ref Range   Glucose-Capillary 132 (H) 70 - 99 mg/dL   Comment 1 Notify RN    Comment 2 Document in Chart  Glucose, capillary  Result Value Ref Range   Glucose-Capillary 149 (H) 70 - 99 mg/dL  Glucose, capillary  Result Value Ref Range   Glucose-Capillary 125 (H) 70 - 99 mg/dL   Comment 1 Notify RN    Comment 2 Document in Chart   Glucose, capillary  Result Value Ref Range   Glucose-Capillary 145 (H) 70 - 99 mg/dL   Comment 1 Notify RN    Comment 2 Document in Chart   Glucose, capillary  Result Value Ref Range   Glucose-Capillary 92 70 - 99 mg/dL   Comment 1 Notify RN    Comment 2 Document in Chart   Glucose, capillary  Result Value Ref Range   Glucose-Capillary 151 (H) 70 - 99 mg/dL   Comment 1 Notify RN    Comment 2 Document in Chart   Glucose, capillary  Result Value Ref Range   Glucose-Capillary 130 (H) 70 - 99 mg/dL  Glucose, capillary  Result Value Ref Range    Glucose-Capillary 124 (H) 70 - 99 mg/dL   Comment 1 Notify RN    Comment 2 Document in Chart       Assessment & Plan:   Problem List Items Addressed This Visit       Cardiovascular and Mediastinum   Intractable migraine with aura without status migrainosus - Primary    Acute migraine, will treat with Toradol, benadryl and phenergan IM injection in office.  Sent in Fioricet and Zofran. Place new referral to neurology.       Relevant Medications   diphenhydrAMINE (BENADRYL) injection 25 mg   promethazine (PHENERGAN) injection 25 mg   ketorolac (TORADOL) injection 30 mg   butalbital-acetaminophen-caffeine (FIORICET) 50-325-40 MG tablet   ondansetron (ZOFRAN-ODT) 4 MG disintegrating tablet   Other Relevant Orders   Ambulatory referral to Neurology     Follow up plan: Return if symptoms worsen or fail to improve.

## 2023-05-11 NOTE — Assessment & Plan Note (Signed)
Acute migraine, will treat with Toradol, benadryl and phenergan IM injection in office.  Sent in Fioricet and Zofran. Place new referral to neurology.

## 2023-05-11 NOTE — Telephone Encounter (Signed)
Patient called and stated she had a migraine headache with nausea. Can she come in to get an injection

## 2023-05-11 NOTE — Telephone Encounter (Signed)
Requested medication (s) are due for refill today: Yes  Requested medication (s) are on the active medication list: Yes  Last refill:  04/28/23  Future visit scheduled: Yes  Notes to clinic:  Unable to refill per protocol, last refill by another provider.      Requested Prescriptions  Pending Prescriptions Disp Refills   busPIRone (BUSPAR) 5 MG tablet [Pharmacy Med Name: busPIRone HCl Oral Tablet 5 MG] 270 tablet 3    Sig: TAKE 1 TABLET THREE TIMES DAILY AS NEEDED     Psychiatry: Anxiolytics/Hypnotics - Non-controlled Passed - 05/10/2023  1:36 PM      Passed - Valid encounter within last 12 months    Recent Outpatient Visits           Today Intractable migraine with aura without status migrainosus   [facility] [name], [name]   1 month ago Acute viral labyrinthitis, unspecified laterality   [facility] [name], [name]   1 month ago Type 2 diabetes mellitus with microalbuminuria, without long-term current use of insulin [facility])   [facility] [name], [name]   4 months ago Preoperative examination   [facility] [name], [name]   7 months ago Intractable migraine with aura without status migrainosus   [facility] [name], [name]

## 2023-05-11 NOTE — Telephone Encounter (Signed)
Patient came in for appointment.

## 2023-05-12 ENCOUNTER — Encounter: Payer: Self-pay | Admitting: Pharmacist

## 2023-05-12 ENCOUNTER — Other Ambulatory Visit: Payer: Self-pay | Admitting: Pharmacist

## 2023-05-12 NOTE — Progress Notes (Signed)
05/12/2023  Patient ID: Lori Potts, female   DOB: [birth year], 77 y.o.   MRN: [id number]  Receive a message from CPhT Lori Potts advising that when she reached patient yesterday, patient informed that she only had ~4 day supply of Farxiga remaining. CPhT contacted [employer] on behalf of patient and was advised that program needed a new prescription for patient. She collaborated with PCP and provider sent a new prescription for patient's Farxiga 5 mg to [facility] (dispensing pharmacy for [employer]) yesterday.   Today follow up with patient today who advises that she will run out of the [place] over the weekend.   [facility] Provide pharmacy with a manufacturer 30 day supply free trial card for [place]. [facility] this through and confirms patient may come to pick up prescription for no cost - Follow up with patient who confirms that she will stop by pharmacy to pick up her prescription today.  Also, patient confirms picked up patient assistance program applications for both Breztri through [employer] and Trulicity through [employer] from office yesterday and will work on competing and then bring these back to office to have faxed back to CPhT, along with her proof of income document.  Lori Potts, PharmD, [facility] [phone]

## 2023-05-12 NOTE — Patient Instructions (Signed)
Goals Addressed             This Visit's Progress    Pharmacy Goals       It was a pleasure speaking with you today!   Please complete the patient assistance program applications and mail back to [facility] Pharmacy Technician Noreene Larsson [place] along with a copy of your Medicare Part D prescription card and a copy of your proof of income document OR you can bring these documents to office to have them faxed back to Attention: Pattricia Boss at Fax # [phone]   If you need to call Noreene Larsson, you can reach her at [phone]   If you need to reach out to patient assistance programs regarding refills or to find out the status of your application, you can do so by calling:   For [facility] assistance, [employer] at: [phone] For Trulicity assistance, [employer] at: [phone]     Thank you!   Estelle Grumbles, PharmD, [facility] [phone]

## 2023-05-12 NOTE — Telephone Encounter (Signed)
This encounter was created in error - please disregard.

## 2023-05-16 DIAGNOSIS — Z4889 Encounter for other specified surgical aftercare: Secondary | ICD-10-CM | POA: Diagnosis not present

## 2023-05-16 DIAGNOSIS — M25551 Pain in right hip: Secondary | ICD-10-CM | POA: Diagnosis not present

## 2023-05-19 ENCOUNTER — Telehealth: Payer: Self-pay | Admitting: Pharmacy Technician

## 2023-05-19 DIAGNOSIS — Z5986 Financial insecurity: Secondary | ICD-10-CM

## 2023-05-19 NOTE — Progress Notes (Signed)
[facility])                                            [facility] Quality Pharmacy Team    05/19/2023  Lori Potts [birth year] [id number]  Care coordination call placed to [employer] to check on patient's refil request and delivery for Farxiga.  Per [employer] IVR system, the medication was delivered on 05/17/23 per [employer] tracking number [id number].  Unsuccessful outreach to patient. HIPAA compliant v/m left requesting a return call to confirm patient received the medication delivery.  Pattricia Boss, CPhT [place]  Office: [phone] Fax: [phone] Email: [email or web address]

## 2023-06-07 ENCOUNTER — Other Ambulatory Visit: Payer: Self-pay | Admitting: Pharmacist

## 2023-06-07 NOTE — Progress Notes (Signed)
06/18/2023 Name: Lori Potts MRN: [id number] DOB: Aug 08, 1946  Chief Complaint  Patient presents with   Medication Assistance    Lori Potts is a 77 y.o. year old female who presented for a telephone visit.   They were referred to the pharmacist by their PCP for assistance in managing medication access.      Subjective:   Care Team: Primary Care Provider: [name], [name] Cardiologist: Mirian Capuchin, MD; Next Scheduled Visit: 01/04/2024 Neurologist: Jolene Provost, MD; Initial Appointment: 08/08/2023  Medication Access/Adherence  [facility]  [facility] [address] [place] - [address] [place] [address] Phone: [phone] Fax: [phone]  [facility] - [place], [place] - [address] [place] [address] Phone: [phone] Fax: [phone]  [facility] ([facility] [place] [facility] - [place], [place] - [address] [place] [address] Phone: [phone] Fax: [phone]  [id number] [place], [place] - [address] [place] [address] Phone: [phone] Fax: [phone]  [facility] - [place], [place] - [address] [place] [address] [place] [address] Phone: [phone] Fax: [phone]   Patient reports affordability concerns with their medications: Yes  Patient reports access/transportation concerns to their pharmacy: No  Patient reports adherence concerns with their medications:  No       Diabetes:   Current medications:  - Farxiga 5 mg daily   Medications tried in the past: Trulicity - not currently taking as enrollment in [place] assistance program lapsed   Current glucose readings: before breakfast ranging primarily 120-130s   Patient denies hypoglycemic s/sx including dizziness, shakiness, sweating.      Statin therapy: rosuvastatin 40 mg daily   Current medication access support:  -  Enrolled in patient assistance for Farxiga through [employer]  Confirms received 3 month supply of Farxiga from patient assistance program - Collaborating with PCP and [facility] CPhT to apply for re-enrollment in patient assistance program for Trulicity from [place] Today patient reports that she completed and brought application back to office, along with her proof of income document, 1-2 weeks ago, but note CPhT has not yet received these documents     Hypertension:   Current medications:  - losartan 25 mg daily - propranolol 20 mg three times daily   Patient has a validated, automated, upper arm home BP cuff Denies checking home blood pressure recently   Patient recent denies hypotensive s/sx including dizziness, lightheadedness.    Current physical activity: movement limited by hip pain, but stays active throughout the day     Asthma and Allergic Rhinitis:   Current medications: - Albuterol HFA 2 puffs every 6 hours as needed  - Breztri inhaler - 2 puffs twice daily - Montelukast 10 mg nightly  - Azelastine nasal spray twice daily as needed   Medications tried in the past: Breo, Trelegy (cost)   Current medication access support: Collaborating with PCP and CPhT to aid patient with applying for [employer] patient assistance program through [employer] Today patient reports that she completed and brought application back to office, along with her proof of income document, 1-2 weeks ago, but note CPhT has not yet received these documents   Objective:  Lab Results  Component Value Date   HGBA1C 7.8 (H) 01/02/2023    Lab Results  Component Value Date   CREATININE 1.15 (H) 01/02/2023   BUN  20 01/02/2023   NA 141 01/02/2023   K 3.9 01/02/2023   CL 105 01/02/2023   CO2 26 01/02/2023    Lab Results  Component Value Date   CHOL 156 09/15/2022   HDL 58 09/15/2022   LDLCALC 81 09/15/2022   TRIG 91 09/15/2022   CHOLHDL 2.7 09/15/2022   Current Outpatient Medications on File Prior to Visit   Medication Sig Dispense Refill   albuterol (PROVENTIL) (2.5 MG/3ML) 0.083% nebulizer solution Take 3 mLs (2.5 mg total) by nebulization every 6 (six) hours as needed for wheezing or shortness of breath. 150 mL 1   albuterol (VENTOLIN HFA) 108 (90 Base) MCG/ACT inhaler Inhale 2 puffs into the lungs every 6 (six) hours as needed for wheezing or shortness of breath. 18 g 2   Alcohol Swabs (DROPSAFE ALCOHOL PREP) 70 % PADS USE TO TEST BLOOD SUGAR TWO TIMES DAILY 200 each 3   azelastine (ASTELIN) 0.1 % nasal spray PLACE 1 SPRAY INTO BOTH NOSTRILS 2 (TWO) TIMES DAILY. USE IN EACH NOSTRIL AS DIRECTED (Patient taking differently: Place 1 spray into both nostrils 2 (two) times daily as needed for rhinitis or allergies. Use in each nostril as directed) 90 mL 1   Budeson-Glycopyrrol-Formoterol (BREZTRI AEROSPHERE) 160-9-4.8 MCG/ACT AERO Inhale 2 puffs into the lungs in the morning and at bedtime.     busPIRone (BUSPAR) 5 MG tablet TAKE 1 TABLET THREE TIMES DAILY AS NEEDED 270 tablet 3   butalbital-acetaminophen-caffeine (FIORICET) 50-325-40 MG tablet Take 1 tablet by mouth every 6 (six) hours as needed for headache. 14 tablet 0   citalopram (CELEXA) 40 MG tablet Take 1 tablet (40 mg total) by mouth daily. 90 tablet 1   dapagliflozin propanediol (FARXIGA) 5 MG TABS tablet Take 1 tablet (5 mg total) by mouth daily before breakfast. 90 tablet 3   docusate sodium (COLACE) 100 MG capsule Take 100 mg by mouth 2 (two) times daily.     Dulaglutide (TRULICITY) 0.75 MG/0.5ML SOPN Inject 0.75 mg into the skin once a week. Patient receives via [employer] Patient Assistance through Dec 2023 6 mL 1   esomeprazole (NEXIUM) 40 MG capsule TAKE 1 CAPSULE AT BEDTIME 90 capsule 1   losartan (COZAAR) 25 MG tablet TAKE 1 TABLET EVERY DAY 90 tablet 1   montelukast (SINGULAIR) 10 MG tablet TAKE 1 TABLET BY MOUTH EVERYDAY AT BEDTIME 90 tablet 3   ondansetron (ZOFRAN-ODT) 4 MG disintegrating tablet Take 1 tablet (4 mg total) by mouth  every 8 (eight) hours as needed for nausea or vomiting. 20 tablet 0   propranolol (INDERAL) 20 MG tablet TAKE 1 TABLET THREE TIMES DAILY 270 tablet 1   TRUEplus Lancets 33G MISC TEST BLOOD SUGAR TWO TIMES DAILY (Patient not taking: Reported on 05/11/2023) 200 each 3   No current facility-administered medications on file prior to visit.       Assessment/Plan:   Diabetes: - Reviewed long term cardiovascular and renal outcomes of uncontrolled blood sugar - Reviewed goal A1c, goal fasting, and goal 2 hour post prandial glucose - Reviewed dietary modifications including importance of having regular well-balanced meals while controlling carbohydrate portion sizes - Recommend to check glucose, keep log of results and have this record to review during future medical appointments - Collaborating with PCP and CPhT to aid patient with applying for re-enrollment in patient assistance program for Trulicity from [employer] with office and find that when patient brought patient assistance documents back to office, she did not specify for these to  be faxed to CPhT. Cassandra advises that the documents were sent to be scanned into chart and should be available to view in chart in a couple of days  Collaborate with CPhT to provide update  Hypertension: - Reviewed long term cardiovascular and renal outcomes of uncontrolled blood pressure - Recommended to check home blood pressure and heart rate, keep log of results and have this record to review during future medical appointments     Asthma: - Reviewed appropriate inhaler technique. - Collaborating with PCP and CPhT to aid patient with applying for [facility] patient assistance program through [employer].   Collaborate with office and find that when patient brought patient assistance documents back to office, she did not specify for these to be faxed to CPhT. Cassandra advises that the documents were sent to be scanned into chart and should be available to  view in chart in a couple of days  Collaborate with CPhT to provide update   Follow Up Plan: Clinical Pharmacist will follow up with patient by telephone again within the next 7 days   Estelle Grumbles, PharmD, [facility] [phone]

## 2023-06-09 ENCOUNTER — Other Ambulatory Visit: Payer: Self-pay | Admitting: Nurse Practitioner

## 2023-06-09 DIAGNOSIS — N1831 Chronic kidney disease, stage 3a: Secondary | ICD-10-CM

## 2023-06-09 DIAGNOSIS — M1611 Unilateral primary osteoarthritis, right hip: Secondary | ICD-10-CM | POA: Diagnosis not present

## 2023-06-09 DIAGNOSIS — R809 Proteinuria, unspecified: Secondary | ICD-10-CM

## 2023-06-09 NOTE — Telephone Encounter (Signed)
Request is too soon for refill, last refill 05/11/23 for 90 and 3 refills sent to [facility] mail pharmacy.   Requested Prescriptions  Pending Prescriptions Disp Refills   FARXIGA 5 MG TABS tablet [Pharmacy Med Name: FARXIGA 5 MG TABLET] 90 tablet 1    Sig: TAKE 1 TABLET BY MOUTH DAILY BEFORE BREAKFAST.     Endocrinology:  Diabetes - SGLT2 Inhibitors Failed - 06/09/2023  1:31 AM      Failed - Cr in normal range and within 360 days    Creat  Date Value Ref Range Status  01/02/2023 1.15 (H) 0.60 - 1.00 mg/dL Final   Creatinine, Urine  Date Value Ref Range Status  05/31/2022 74 20 - 275 mg/dL Final         Failed - eGFR in normal range and within 360 days    GFR, Est African American  Date Value Ref Range Status  02/18/2020 101 > OR = 60 mL/min/1.59m2 Final   GFR, Est Non African American  Date Value Ref Range Status  02/18/2020 87 > OR = 60 mL/min/1.59m2 Final   GFR, Estimated  Date Value Ref Range Status  06/09/2022 56 (L) >60 mL/min Final    Comment:    (NOTE) Calculated using the CKD-EPI Creatinine Equation (2021)    eGFR  Date Value Ref Range Status  01/02/2023 49 (L) > OR = 60 mL/min/1.17m2 Final         Passed - HBA1C is between 0 and 7.9 and within 180 days    Hemoglobin A1C  Date Value Ref Range Status  03/31/2019 6.6  Final   Hgb A1c MFr Bld  Date Value Ref Range Status  01/02/2023 7.8 (H) <5.7 % of total Hgb Final    Comment:    For someone without known diabetes, a hemoglobin A1c value of 6.5% or greater indicates that they may have  diabetes and this should be confirmed with a follow-up  test. . For someone with known diabetes, a value <7% indicates  that their diabetes is well controlled and a value  greater than or equal to 7% indicates suboptimal  control. A1c targets should be individualized based on  duration of diabetes, age, comorbid conditions, and  other considerations. . Currently, no consensus exists regarding use of hemoglobin A1c for  diagnosis of diabetes for children. [name] - Valid encounter within last 6 months    Recent Outpatient Visits           4 weeks ago Intractable migraine with aura without status migrainosus   [facility] [name], [name]   2 months ago Acute viral labyrinthitis, unspecified laterality   [facility] [name], [name]   2 months ago Type 2 diabetes mellitus with microalbuminuria, without long-term current use of insulin [facility])   [facility] [name], [name]   5 months ago Preoperative examination   [facility] [name], [name]   8 months ago Intractable migraine with aura without status migrainosus   [facility] [name], [name]

## 2023-06-12 ENCOUNTER — Other Ambulatory Visit: Payer: Self-pay | Admitting: Emergency Medicine

## 2023-06-12 ENCOUNTER — Other Ambulatory Visit: Payer: Self-pay | Admitting: Pharmacist

## 2023-06-12 DIAGNOSIS — E7800 Pure hypercholesterolemia, unspecified: Secondary | ICD-10-CM

## 2023-06-12 MED ORDER — ROSUVASTATIN CALCIUM 40 MG PO TABS: 40.0000 mg | ORAL_TABLET | Freq: Every day | ORAL | 1 refills | Status: DC

## 2023-06-12 NOTE — Progress Notes (Signed)
06/12/2023  Patient ID: Lori Potts, female   DOB: Feb 24, 1946, 76 y.o.   MRN: [id number]  Receive a call from patient following up regarding re-enrollment in patient assistance program for Trulicity from [place] and patient assistance application for Breztri inhaler from [employer].  Note when spoke with patient last week, reported that she brought these applications back to office, but did not specify who they were to be faxed to. Have reviewed Media tab for patient again today. No completed application has been scanned into chart this month.   Collaborate with CMA Freada Bergeron to request reprint of application at office for patient to come by and complete today.  Patient confirms will come by office today to complete both patient assistance program applications and request that office fax these back to CPhT Pattricia Boss at Fax: [phone]   Follow Up Plan: Clinical Pharmacist will follow up with patient by telephone again within the next 30 days   Estelle Grumbles, PharmD, [facility] [phone]

## 2023-06-15 ENCOUNTER — Other Ambulatory Visit: Payer: Self-pay

## 2023-06-15 ENCOUNTER — Encounter: Payer: Self-pay | Admitting: Nurse Practitioner

## 2023-06-15 ENCOUNTER — Other Ambulatory Visit: Payer: Self-pay | Admitting: Nurse Practitioner

## 2023-06-15 ENCOUNTER — Telehealth: Payer: Medicare HMO | Admitting: Nurse Practitioner

## 2023-06-15 VITALS — Ht 63.5 in | Wt 171.0 lb

## 2023-06-15 DIAGNOSIS — G43119 Migraine with aura, intractable, without status migrainosus: Secondary | ICD-10-CM

## 2023-06-15 MED ORDER — NURTEC 75 MG PO TBDP: 75.0000 mg | ORAL_TABLET | ORAL | 0 refills | Status: DC | PRN

## 2023-06-15 MED ORDER — DIPHENHYDRAMINE HCL 50 MG/ML IJ SOLN
25.0000 mg | Freq: Once | INTRAMUSCULAR | Status: DC
Start: 2023-06-15 — End: 2023-06-15

## 2023-06-15 MED ORDER — PROMETHAZINE HCL 25 MG/ML IJ SOLN
25.0000 mg | Freq: Once | INTRAMUSCULAR | Status: AC
Start: 2023-06-15 — End: 2023-06-15
  Administered 2023-06-15: 25 mg via INTRAMUSCULAR

## 2023-06-15 MED ORDER — KETOROLAC TROMETHAMINE 30 MG/ML IJ SOLN
30.0000 mg | Freq: Once | INTRAMUSCULAR | Status: AC
Start: 2023-06-15 — End: 2023-06-15
  Administered 2023-06-15: 30 mg via INTRAMUSCULAR

## 2023-06-15 NOTE — Progress Notes (Signed)
Name: Lori Potts   MRN: [id number]    DOB: July 18, 1946   Date:06/15/2023       Progress Note  Subjective  Chief Complaint  Chief Complaint  Patient presents with   Dizziness    I connected with  [name]  on 06/15/23 at 1020 am by a video enabled telemedicine application and verified that I am speaking with the correct person using two identifiers.  I discussed the limitations of evaluation and management by telemedicine and the availability of in person appointments. The patient expressed understanding and agreed to proceed with a virtual visit  Staff also discussed with the patient that there may be a patient responsible charge related to this service. Patient Location: home Provider Location: [facility] Additional Individuals present: alone  HPI  migraine: she reports that she has had her typical migraine since yesterday.she says that she took her fioricet but it only helped a little bit.  She says that she still has a migraine with nausea and dizziness. She says that she has her appointment with neurology in December.  Discussed options and she says she will come by the office for a migraine cocktail.  Will give her phenergan 25 mg and toradol 30 mg injection. We no longer carry benadryl, advised patient to take some at home.   If patient has any worsening symptoms she knows to seek emergency care.  Also sent in a prescription for nurtec.   Patient Active Problem List   Diagnosis Date Noted   S/P lumbar fusion 01/30/2023   Stage 3a chronic kidney disease (HCC) 09/15/2022   OA (osteoarthritis) of hip 06/08/2022   Primary osteoarthritis of left hip 06/08/2022   Anxiety 04/07/2022   Palpitations 04/07/2022   Mild episode of recurrent major depressive disorder (HCC) 04/07/2022   Moderate persistent asthma without complication 02/16/2022   Intractable migraine with aura without status migrainosus 04/18/2020   Body mass index (BMI) 31.0-31.9, adult 02/06/2020   Chronic left  hip pain 12/26/2019   Anemia 09/22/2017   S/P knee replacement 07/28/2017   Allergic rhinitis 05/26/2017   Vitamin D deficiency 05/19/2017   Chronic nonintractable headache 05/18/2017   Advanced care planning/counseling discussion    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HCC) 03/02/2017   Insomnia 11/29/2016   Abdominal wall pain in right flank 03/29/2016   Diverticulosis 02/24/2016   Type 2 diabetes mellitus with microalbuminuria, without long-term current use of insulin (HCC) 06/16/2015   Hyperlipidemia associated with type 2 diabetes mellitus (HCC) 06/16/2015   Chronic pain 04/13/2015   Neck pain 02/26/2015   Status post lumbar surgery 02/26/2015   Abnormal ECG 01/27/2015   Essential (primary) hypertension 01/27/2015    Social History   Tobacco Use   Smoking status: Former    Current packs/day: 0.00    Types: Cigarettes    Quit date: 2006    Years since quitting: 18.8   Smokeless tobacco: Never   Tobacco comments:    quit smoking 71yrs ago  Substance Use Topics   Alcohol use: No     Current Outpatient Medications:    albuterol (PROVENTIL) (2.5 MG/3ML) 0.083% nebulizer solution, Take 3 mLs (2.5 mg total) by nebulization every 6 (six) hours as needed for wheezing or shortness of breath., Disp: 150 mL, Rfl: 1   albuterol (VENTOLIN HFA) 108 (90 Base) MCG/ACT inhaler, Inhale 2 puffs into the lungs every 6 (six) hours as needed for wheezing or shortness of breath., Disp: 18 g, Rfl: 2   Alcohol Swabs (DROPSAFE ALCOHOL  PREP) 70 % PADS, USE TO TEST BLOOD SUGAR TWO TIMES DAILY, Disp: 200 each, Rfl: 3   azelastine (ASTELIN) 0.1 % nasal spray, PLACE 1 SPRAY INTO BOTH NOSTRILS 2 (TWO) TIMES DAILY. USE IN EACH NOSTRIL AS DIRECTED (Patient taking differently: Place 1 spray into both nostrils 2 (two) times daily as needed for rhinitis or allergies. Use in each nostril as directed), Disp: 90 mL, Rfl: 1   Budeson-Glycopyrrol-Formoterol (BREZTRI AEROSPHERE) 160-9-4.8 MCG/ACT AERO, Inhale 2 puffs into the  lungs in the morning and at bedtime., Disp: , Rfl:    busPIRone (BUSPAR) 5 MG tablet, TAKE 1 TABLET THREE TIMES DAILY AS NEEDED, Disp: 270 tablet, Rfl: 3   butalbital-acetaminophen-caffeine (FIORICET) 50-325-40 MG tablet, Take 1 tablet by mouth every 6 (six) hours as needed for headache., Disp: 14 tablet, Rfl: 0   citalopram (CELEXA) 40 MG tablet, Take 1 tablet (40 mg total) by mouth daily., Disp: 90 tablet, Rfl: 1   dapagliflozin propanediol (FARXIGA) 5 MG TABS tablet, Take 1 tablet (5 mg total) by mouth daily before breakfast., Disp: 90 tablet, Rfl: 3   docusate sodium (COLACE) 100 MG capsule, Take 100 mg by mouth 2 (two) times daily., Disp: , Rfl:    Dulaglutide (TRULICITY) 0.75 MG/0.5ML SOPN, Inject 0.75 mg into the skin once a week. Patient receives via [employer] Patient Assistance through Dec 2023, Disp: 6 mL, Rfl: 1   esomeprazole (NEXIUM) 40 MG capsule, TAKE 1 CAPSULE AT BEDTIME, Disp: 90 capsule, Rfl: 1   losartan (COZAAR) 25 MG tablet, TAKE 1 TABLET EVERY DAY, Disp: 90 tablet, Rfl: 1   montelukast (SINGULAIR) 10 MG tablet, TAKE 1 TABLET BY MOUTH EVERYDAY AT BEDTIME, Disp: 90 tablet, Rfl: 3   ondansetron (ZOFRAN-ODT) 4 MG disintegrating tablet, Take 1 tablet (4 mg total) by mouth every 8 (eight) hours as needed for nausea or vomiting., Disp: 20 tablet, Rfl: 0   propranolol (INDERAL) 20 MG tablet, TAKE 1 TABLET THREE TIMES DAILY, Disp: 270 tablet, Rfl: 1   Rimegepant Sulfate (NURTEC) 75 MG TBDP, Take 1 tablet (75 mg total) by mouth as needed (migraine)., Disp: 8 tablet, Rfl: 0   rosuvastatin (CRESTOR) 40 MG tablet, Take 1 tablet (40 mg total) by mouth daily., Disp: 90 tablet, Rfl: 1   TRUEplus Lancets 33G MISC, TEST BLOOD SUGAR TWO TIMES DAILY (Patient not taking: Reported on 05/11/2023), Disp: 200 each, Rfl: 3  Allergies  Allergen Reactions   Metformin And Related Diarrhea   Adhesive [Tape] Rash and Other (See Comments)    Regular tape is ok, allergy is to paper tape    I personally  reviewed active problem list, medication list, allergies, notes from last encounter with the patient/caregiver today.  ROS  Ten systems reviewed and is negative except as mentioned in HPI    Objective  Virtual encounter, vitals not obtained.  Body mass index is 29.82 kg/m.  Nursing Note and Vital Signs reviewed.  Physical Exam  Awake, alert and oriented, speaking in complete sentences  No results found for this or any previous visit (from the past 72 hour(s)).  Assessment & Plan  1. Intractable migraine with aura without status migrainosus Will give her phenergan 25 mg and toradol 30 mg injection. We no longer carry benadryl, advised patient to take some at home.   If patient has any worsening symptoms she knows to seek emergency care.  Also sent in a prescription for nurtec.  If any worsening symptoms seek emergency care - ketorolac (TORADOL) 30 MG/ML injection  30 mg - promethazine (PHENERGAN) injection 25 mg - Rimegepant Sulfate (NURTEC) 75 MG TBDP; Take 1 tablet (75 mg total) by mouth as needed (migraine).  Dispense: 8 tablet; Refill: 0   -Red flags and when to present for emergency care or RTC including fever >101.48F, chest pain, shortness of breath, new/worsening/un-resolving symptoms,  reviewed with patient at time of visit. Follow up and care instructions discussed and provided in AVS. - I discussed the assessment and treatment plan with the patient. The patient was provided an opportunity to ask questions and all were answered. The patient agreed with the plan and demonstrated an understanding of the instructions.  I provided 15 minutes of non-face-to-face time during this encounter.  [name], [name]

## 2023-06-16 NOTE — Telephone Encounter (Signed)
Requested medications are due for refill today.  See note  Requested medications are on the active medications list.  no  Last refill. never  Future visit scheduled.   no  Notes to clinic.  Pharmacy comment: Alternative Requested:THE PRESCRIBED MEDICATION IS NOT COVERED BY INSURANCE. PLEASE CONSIDER CHANGING TO ONE OF THE SUGGESTED COVERED ALTERNATIVES.    Requested Prescriptions  Pending Prescriptions Disp Refills   AIMOVIG 70 MG/ML SOAJ [Pharmacy Med Name: AIMOVIG 13 MG/ML AUTOINJECTOR]  0     Off-Protocol Failed - 06/15/2023 12:03 PM      Failed - Medication not assigned to a protocol, review manually.      Passed - Valid encounter within last 12 months    Recent Outpatient Visits           Yesterday Intractable migraine with aura without status migrainosus   [facility] [name], [name]   1 month ago Intractable migraine with aura without status migrainosus   [facility] [name], [name]   2 months ago Acute viral labyrinthitis, unspecified laterality   [facility] [name], [name]   2 months ago Type 2 diabetes mellitus with microalbuminuria, without long-term current use of insulin [facility])   [facility] [name], [name]   5 months ago Preoperative examination   [facility] [name], [name]             Off-Protocol Failed - 06/15/2023 12:03 PM      Failed - Medication not assigned to a protocol, review manually.      Passed - Valid encounter within last 12 months    Recent Outpatient Visits           Yesterday Intractable migraine with aura without status migrainosus   [facility] [name], [name]   1 month ago Intractable migraine with aura without status migrainosus   [facility] [name], [name]   2 months ago Acute viral labyrinthitis,  unspecified laterality   [facility] [name], [name]   2 months ago Type 2 diabetes mellitus with microalbuminuria, without long-term current use of insulin [facility])   [facility] [name], [name]   5 months ago Preoperative examination   [facility] [name], [name]

## 2023-06-18 NOTE — Patient Instructions (Signed)
Goals Addressed             This Visit's Progress    Pharmacy Goals       If you need to call Noreene Larsson, you can reach her at [phone]   If you need to reach out to patient assistance programs regarding refills or to find out the status of your application, you can do so by calling:   For [place] assistance, [employer] at: [phone] For Trulicity assistance, [employer] at: [phone]   Thank you!   Estelle Grumbles, PharmD, [facility] [phone]

## 2023-06-21 ENCOUNTER — Other Ambulatory Visit: Payer: Self-pay | Admitting: Nurse Practitioner

## 2023-06-21 DIAGNOSIS — G43119 Migraine with aura, intractable, without status migrainosus: Secondary | ICD-10-CM

## 2023-06-21 MED ORDER — UBRELVY 50 MG PO TABS: 50.0000 mg | ORAL_TABLET | ORAL | 0 refills | Status: DC | PRN

## 2023-06-28 ENCOUNTER — Telehealth: Payer: Self-pay | Admitting: Pharmacist

## 2023-06-28 ENCOUNTER — Other Ambulatory Visit: Payer: Self-pay | Admitting: Nurse Practitioner

## 2023-06-28 ENCOUNTER — Other Ambulatory Visit: Payer: Medicare HMO | Admitting: Pharmacist

## 2023-06-28 DIAGNOSIS — F419 Anxiety disorder, unspecified: Secondary | ICD-10-CM

## 2023-06-28 NOTE — Progress Notes (Signed)
   Outreach Note  06/28/2023 Name: Lori Potts MRN: [id number] DOB: 30-Sep-1945  Referred by: [name], [name] Reason for referral : Medication Assistance  Outreach to patient today to follow up regarding medication assistance. Patient reports that she picked up copies of the applications for patient assistance program for Trulicity from [place] and patient assistance application for [employer] inhaler from [employer] and is working on completing this paperwork. Confirms has contact information to have paperwork mailed back to [employer]  Reports that she is not feeling well today as she has a cold and request a call back another day.  Follow Up Plan: Will outreach to patient again by telephone next month.  Estelle Grumbles, PharmD, [facility] [phone]

## 2023-06-29 ENCOUNTER — Telehealth: Payer: Medicare HMO | Admitting: Physician Assistant

## 2023-06-29 DIAGNOSIS — J069 Acute upper respiratory infection, unspecified: Secondary | ICD-10-CM

## 2023-06-29 MED ORDER — AZITHROMYCIN 250 MG PO TABS: ORAL_TABLET | ORAL | 0 refills | Status: DC

## 2023-06-29 MED ORDER — BENZONATATE 100 MG PO CAPS: 100.0000 mg | ORAL_CAPSULE | Freq: Two times a day (BID) | ORAL | 0 refills | Status: DC | PRN

## 2023-06-29 NOTE — Progress Notes (Signed)
Virtual Visit via Video Note  I connected with Lori Potts on 06/29/23 at  8:40 AM EST by a video enabled telemedicine application and verified that I am speaking with the correct person using two identifiers.  Today's Provider: Jacquelin Hawking, MHS, PA-C Introduced myself to the patient as a PA-C and provided education on APPs in clinical practice.   Location: Patient: at home  Provider: [facility], [place], [place]    I discussed the limitations of evaluation and management by telemedicine and the availability of in person appointments. The patient expressed understanding and agreed to proceed.   Chief Complaint  Patient presents with   Cough    Onset for a while not getting better   Hoarse    History of Present Illness:  Cough  Onset: sudden  Duration: 3-4 days  Associated symptoms: productive cough and headache, hoarse voice, postnasal drainage, rhinorrhea, nasal congestion  She reports her throat was a bit sore but that has resolved  She denies SOB at this time   Intervention: She has tried OTC benadryl, cough syrup  She has been using her Rescue inhaler about 2 times per day to help with breathing since she became sick   Recent sick contacts: she denies known sick contacts  Recent travel: none - she was helping with [employer] last week and was around a lot of children   COVID testing at home: she tested 3 days ago   Result:negative  She recently got her COVID booster and RSV vaccince     Review of Systems  Constitutional:  Positive for malaise/fatigue. Negative for chills and fever.  HENT:  Positive for congestion. Negative for sore throat.   Respiratory:  Positive for cough and sputum production.   Neurological:  Positive for headaches.    Observations/Objective:   Due to the nature of the virtual visit, physical exam and observations are limited. Able to obtain the following observations:   Alert, oriented, x3 Appears  comfortable, in no acute distress.  No scleral injection, tachypnea, wheeze or strider. Able to maintain conversation without visible strain.  She sounds mildly hoarse and is coughing during visit    Assessment and Plan:    Follow Up Instructions:    I discussed the assessment and treatment plan with the patient. The patient was provided an opportunity to ask questions and all were answered. The patient agreed with the plan and demonstrated an understanding of the instructions.   The patient was advised to call back or seek an in-person evaluation if the symptoms worsen or if the condition fails to improve as anticipated.  I provided 8 minutes of non-face-to-face time during this encounter.   Problem List Items Addressed This Visit   None Visit Diagnoses     Upper respiratory tract infection, unspecified type    -  Primary   Relevant Medications   azithromycin (ZITHROMAX) 250 MG tablet   benzonatate (TESSALON) 100 MG capsule      Acute, new concern Patient reports ongoing dry cough, fatigue, nasal congestion, headaches for the past 3 to 4 days that is not resolving with home measures She reports increased use of her rescue inhaler since she became ill Will provide prescription for Z-Pak to assist with pulmonary inflammation and provide bacterial coverage Will also provide Tessalon Perles to assist with coughing Reviewed over-the-counter medications that will also assist with symptom management Reviewed rescue inhaler recommendations to assist with breathing while not feeling well Reviewed ED and return  precautions Follow-up as needed for progressing or persistent symptoms  No follow-ups on file.   I, Layanna Charo E Joniqua Sidle, PA-C, have reviewed all documentation for this visit. The documentation on 06/29/23 for the exam, diagnosis, procedures, and orders are all accurate and complete.   Jacquelin Hawking, MHS, PA-C [facility]

## 2023-06-29 NOTE — Telephone Encounter (Signed)
Requested Prescriptions  Pending Prescriptions Disp Refills   citalopram (CELEXA) 40 MG tablet [Pharmacy Med Name: CITALOPRAM HBR 40 MG TABLET] 90 tablet 0    Sig: TAKE 1 TABLET BY MOUTH EVERY DAY     Psychiatry:  Antidepressants - SSRI Passed - 06/28/2023  1:38 AM      Passed - Completed PHQ-2 or PHQ-9 in the last 360 days      Passed - Valid encounter within last 6 months    Recent Outpatient Visits           2 weeks ago Intractable migraine with aura without status migrainosus   [facility] [name], [name]   1 month ago Intractable migraine with aura without status migrainosus   [facility] [name], [name]   2 months ago Acute viral labyrinthitis, unspecified laterality   [facility] [name], [name]   3 months ago Type 2 diabetes mellitus with microalbuminuria, without long-term current use of insulin [facility])   [facility] [name], [name]   5 months ago Preoperative examination   [facility] [name], [name]

## 2023-07-12 DIAGNOSIS — E119 Type 2 diabetes mellitus without complications: Secondary | ICD-10-CM | POA: Diagnosis not present

## 2023-07-13 DIAGNOSIS — H538 Other visual disturbances: Secondary | ICD-10-CM | POA: Diagnosis not present

## 2023-07-13 DIAGNOSIS — Z0100 Encounter for examination of eyes and vision without abnormal findings: Secondary | ICD-10-CM | POA: Diagnosis not present

## 2023-07-13 DIAGNOSIS — E119 Type 2 diabetes mellitus without complications: Secondary | ICD-10-CM | POA: Diagnosis not present

## 2023-07-13 DIAGNOSIS — H353132 Nonexudative age-related macular degeneration, bilateral, intermediate dry stage: Secondary | ICD-10-CM | POA: Diagnosis not present

## 2023-07-13 DIAGNOSIS — H2513 Age-related nuclear cataract, bilateral: Secondary | ICD-10-CM | POA: Diagnosis not present

## 2023-07-13 DIAGNOSIS — H04123 Dry eye syndrome of bilateral lacrimal glands: Secondary | ICD-10-CM | POA: Diagnosis not present

## 2023-07-13 LAB — HM DIABETES EYE EXAM

## 2023-07-25 DIAGNOSIS — M5030 Other cervical disc degeneration, unspecified cervical region: Secondary | ICD-10-CM | POA: Diagnosis not present

## 2023-07-25 DIAGNOSIS — G894 Chronic pain syndrome: Secondary | ICD-10-CM | POA: Diagnosis not present

## 2023-07-25 DIAGNOSIS — M5416 Radiculopathy, lumbar region: Secondary | ICD-10-CM | POA: Diagnosis not present

## 2023-07-25 DIAGNOSIS — Z79891 Long term (current) use of opiate analgesic: Secondary | ICD-10-CM | POA: Diagnosis not present

## 2023-07-25 DIAGNOSIS — Z96651 Presence of right artificial knee joint: Secondary | ICD-10-CM | POA: Diagnosis not present

## 2023-07-25 DIAGNOSIS — M5459 Other low back pain: Secondary | ICD-10-CM | POA: Diagnosis not present

## 2023-07-26 ENCOUNTER — Other Ambulatory Visit: Payer: Self-pay | Admitting: Pharmacist

## 2023-07-26 NOTE — Progress Notes (Signed)
07/26/2023 Name: Lori Potts MRN: [id number] DOB: 03/07/1946  Chief Complaint  Patient presents with   Medication Assistance    Lori Potts is a 77 y.o. year old female who presented for a telephone visit.   They were referred to the pharmacist by their PCP for assistance in managing medication access.      Subjective:   Care Team: Primary Care Provider: [name], [name] Cardiologist: Mirian Capuchin, MD; Next Scheduled Visit: 01/04/2024 Neurologist: Jolene Provost, MD; Initial Appointment: 08/08/2023  Medication Access/Adherence  Current Pharmacy:  [facility] [address], [place] - [address] [place] [address] Phone: [phone] Fax: [phone]  [facility] - [place], [place] - [address] [place] [address] Phone: [phone] Fax: [phone]  [facility] ([facility] - [place], [place] - [address] [place] [address] Phone: [phone] Fax: [phone]  [id number] [place], [place] - [address] [place] [address] Phone: [phone] Fax: [phone]  [facility] - [place], [place] - [address] [place] [address] [place] [address] Phone: [phone] Fax: [phone]   Patient reports affordability concerns with their medications: Yes  Patient reports access/transportation concerns to their pharmacy: No  Patient reports adherence concerns with their medications:  No     Diabetes:  Current medications:  - Farxiga 5 mg daily  Note patient not currently taking Trulicity as enrollment in [place] assistance program lapsed  Medications tried in the past: Ozempic  Current glucose readings: before breakfast ranging primarily 140-160  Patient denies hypoglycemic s/sx including dizziness, shakiness, sweating.   Current meal patterns:  - Breakfast: eggs and bacon - Lunch: hamburger  or grilled cheese with french fries or skips - Supper: grilled or BBQ chicken with peas and mashed potatoes or salad - Snacks: peanut butter crackers - Drinks: water and sometimes OJ  Current physical activity: stays active through the home throughout the day, but often limited by back pain  Current medication access support:  - Enrolled in patient assistance for Farxiga through [place] through 08/21/2024 - Collaborating with PCP and CPhT to apply for re-enrollment in patient assistance program for Trulicity from [name] Today patient reports that she completed and brought application back to office, along with her proof of income document a couple of weeks ago  Hypertension:   Current medications:  - losartan 25 mg daily - propranolol 20 mg three times daily   Patient has a validated, automated, upper arm home BP cuff Current blood pressure readings readings: last checked on 12/2, reading: 132/68    Patient recent denies hypotensive s/sx including dizziness, lightheadedness.    Current physical activity: movement limited by hip pain, but stays active throughout the day     Asthma and Allergic Rhinitis:   Current medications: - Albuterol HFA 2 puffs every 6 hours as needed  - Breztri inhaler - 2 puffs twice daily  Confirms rinses and spits out after each use - Montelukast 10 mg nightly  - Azelastine nasal spray twice daily as needed   Medications tried in the past: Breo, Trelegy (cost)   Reports breathing has been good recently; denies needing albuterol rescue inhaler recently  Current medication access support:  - Enrolled in patient assistance for Breztri through [place] through 08/21/2024   Objective:  Lab Results  Component Value Date   HGBA1C 7.8 (  H) 01/02/2023    Lab Results  Component Value Date   CREATININE 1.15 (H) 01/02/2023   BUN 20 01/02/2023   NA 141 01/02/2023   K 3.9 01/02/2023   CL 105 01/02/2023   CO2 26 01/02/2023    Lab Results  Component  Value Date   CHOL 156 09/15/2022   HDL 58 09/15/2022   LDLCALC 81 09/15/2022   TRIG 91 09/15/2022   CHOLHDL 2.7 09/15/2022   BP Readings from Last 3 Encounters:  05/11/23 124/72  03/20/23 124/76  02/02/23 (!) 129/56   Pulse Readings from Last 3 Encounters:  05/11/23 75  03/20/23 77  02/02/23 61    Medications Reviewed Today     Reviewed by Manuela Neptune, RPH-CPP (Pharmacist) on 07/26/23 at [phone]  Med List Status: <None>   Medication Order Taking? Sig Documenting Provider Last Dose Status Informant  albuterol (PROVENTIL) (2.5 MG/3ML) 0.083% nebulizer solution [id number]  Take 3 mLs (2.5 mg total) by nebulization every 6 (six) hours as needed for wheezing or shortness of breath. [name], [name]  Active Self  albuterol (VENTOLIN HFA) 108 (90 Base) MCG/ACT inhaler [id number] Yes Inhale 2 puffs into the lungs every 6 (six) hours as needed for wheezing or shortness of breath. [name], [name] Taking Active Self  Alcohol Swabs (DROPSAFE ALCOHOL PREP) 70 % PADS [id number]  USE TO TEST BLOOD SUGAR TWO TIMES DAILY [name], [name]  Active   azelastine (ASTELIN) 0.1 % nasal spray [id number]  PLACE 1 SPRAY INTO BOTH NOSTRILS 2 (TWO) TIMES DAILY. USE IN EACH NOSTRIL AS DIRECTED  Patient taking differently: Place 1 spray into both nostrils 2 (two) times daily as needed for rhinitis or allergies. Use in each nostril as directed   Alba Cory, MD  Active Self  benzonatate (TESSALON) 100 MG capsule [id number]  Take 1 capsule (100 mg total) by mouth 2 (two) times daily as needed for cough. Mecum, Oswaldo Conroy, PA-C  Active   Budeson-Glycopyrrol-Formoterol (BREZTRI AEROSPHERE) 160-9-4.8 MCG/ACT AERO [id number] Yes Inhale 2 puffs into the lungs in the morning and at bedtime. [provider] Taking Active Self  busPIRone (BUSPAR) 5 MG tablet [id number]  TAKE 1 TABLET THREE TIMES DAILY AS NEEDED [name], [name]  Active   butalbital-acetaminophen-caffeine (FIORICET) 50-325-40 MG  tablet [id number]  Take 1 tablet by mouth every 6 (six) hours as needed for headache. [name], [name]  Active   citalopram (CELEXA) 40 MG tablet [id number]  TAKE 1 TABLET BY MOUTH EVERY DAY [name], [name]  Active   dapagliflozin propanediol (FARXIGA) 5 MG TABS tablet [id number] Yes Take 1 tablet (5 mg total) by mouth daily before breakfast. [name], [name] Taking Active   docusate sodium (COLACE) 100 MG capsule [id number]  Take 100 mg by mouth 2 (two) times daily. [provider]  Active Self  Dulaglutide (TRULICITY) 0.75 MG/0.5ML SOPN [id number] No Inject 0.75 mg into the skin once a week. Patient receives via [employer] Patient Assistance through Dec 2023  Patient not taking: Reported on 07/26/2023   [name], [name] Not Taking Active   esomeprazole (NEXIUM) 40 MG capsule [id number]  TAKE 1 CAPSULE AT BEDTIME [name], [name]  Active   losartan (COZAAR) 25 MG tablet [id number] Yes TAKE 1 TABLET EVERY DAY [name], [name] Taking Active   montelukast (SINGULAIR) 10 MG tablet [id number]  TAKE 1 TABLET BY MOUTH EVERYDAY AT BEDTIME [name], [name]  Active Self  ondansetron (ZOFRAN-ODT) 4 MG disintegrating tablet [id number]  Take 1 tablet (4 mg total) by mouth every 8 (eight) hours as needed for nausea or vomiting. [name], [name]  Active   propranolol (INDERAL) 20 MG tablet [id number] Yes TAKE 1 TABLET THREE TIMES DAILY [name], [name] Taking Active   rosuvastatin (CRESTOR) 40 MG tablet [id number] Yes Take 1 tablet (40 mg total) by mouth daily. [name], [name] Taking Active   TRUEplus Lancets 33G MISC [id number]  TEST BLOOD SUGAR TWO TIMES DAILY [name], [name]  Active   Ubrogepant (UBRELVY) 50 MG TABS [id number]  Take 1 tablet (50 mg total) by mouth as needed (migrane). [name], [name]  Active               Assessment/Plan:   Diabetes: - Reviewed long term cardiovascular and renal outcomes of uncontrolled blood sugar - Reviewed goal A1c,  goal fasting, and goal 2 hour post prandial glucose - Reviewed dietary modifications including importance of having regular well-balanced meals while controlling carbohydrate portion sizes  Discuss ideas for balanced snacks  Encourage patient to review nutrition labels for total carbohydrate content of foods - Recommend to check glucose, keep log of results and have this record to review during future medical appointments - Collaborating with PCP and CPhT to aid patient with applying for re-enrollment in patient assistance program for Trulicity from [place]   Hypertension: - Reviewed long term cardiovascular and renal outcomes of uncontrolled blood pressure - Recommended to check home blood pressure and heart rate, keep log of results and have this record to review during future medical appointments     Asthma: - Reviewed appropriate inhaler technique.    Follow Up Plan: Clinical Pharmacist will follow up with patient by telephone on 09/06/2023 at 3:30 PM    Estelle Grumbles, PharmD, [facility] [phone]

## 2023-07-27 ENCOUNTER — Other Ambulatory Visit: Payer: Self-pay | Admitting: Pharmacy Technician

## 2023-07-27 ENCOUNTER — Other Ambulatory Visit: Payer: Self-pay | Admitting: Nurse Practitioner

## 2023-07-27 DIAGNOSIS — Z5986 Financial insecurity: Secondary | ICD-10-CM

## 2023-07-27 DIAGNOSIS — N1831 Chronic kidney disease, stage 3a: Secondary | ICD-10-CM

## 2023-07-27 DIAGNOSIS — E1129 Type 2 diabetes mellitus with other diabetic kidney complication: Secondary | ICD-10-CM

## 2023-07-27 DIAGNOSIS — R809 Proteinuria, unspecified: Secondary | ICD-10-CM

## 2023-07-27 NOTE — Progress Notes (Signed)
Pharmacy Medication Assistance Program Note    07/27/2023  Patient ID: Lori Potts, female   DOB: [birth year], 77 y.o.   MRN: [id number]     07/27/2023  Outreach Medication One  Initial Outreach Date (Medication One) 04/28/2023  Manufacturer Medication One [employer]  [employer] Farxiga  Dose of Farxiga 5mg   Type of Radiographer, therapeutic Assistance  Date Application Sent to Patient 04/28/2023  Application Items Requested Application;Proof of Income;Other  Date Application Sent to Prescriber 04/28/2023  Name of Prescriber [name]  Date Application Received From Provider 05/01/2023  Date Application Submitted to Manufacturer 05/01/2023  Method Application Sent to Manufacturer Fax  Patient Assistance Determination Approved  Approval Start Date 05/02/2023  Approval End Date 08/21/2024  Patient Notification Method Telephone Call  Telephone Call Outcome Successful  Additional Outreach Contact Provider         07/27/2023  Outreach Medication Two  Initial Outreach Date (Medication Two) 04/28/2023  Manufacturer Medication Two [employer]  [employer] Drugs Bretztri  Type of Audiological scientist Items Requested Application;Proof of Income;Other  Date Application Sent to Prescriber 04/28/2023  Date Application Received From Provider 05/05/2023  Method Application Sent to Manufacturer Fax  Date Application Submitted to Manufacturer 05/05/2023  Patient Assistance Determination Approved  Approval Start Date 05/06/2023  Patient Notification Method MyChart  Additional Outreach Contact Provider       , female  DOB: [birth year], 77 y.o.  MRN:  [id number]     07/27/2023  Outreach Medication Three  Initial Outreach Date (Medication Three) 07/27/2023  Manufacturer Medication Three Lilly  [employer]illy Drugs Trulicity  Dose of Trulicity 0.75mg /0.68ml  Type of Radiographer, therapeutic Assistance  Date Application Sent to Patient 07/27/2023  Application Items  Requested Application;Other  Date Application Sent to Prescriber 07/27/2023  Name of Prescriber [name]       SIGNATURE   Pattricia Boss, CPhT [place]  Office: [phone] Fax: [phone] Email: [email or web address]

## 2023-07-28 ENCOUNTER — Telehealth: Payer: Self-pay | Admitting: Pharmacy Technician

## 2023-07-28 DIAGNOSIS — Z5986 Financial insecurity: Secondary | ICD-10-CM

## 2023-07-28 NOTE — Telephone Encounter (Signed)
Rx was sent to [facility] 05/11/23 #90/3.   Requested Prescriptions  Pending Prescriptions Disp Refills   FARXIGA 5 MG TABS tablet [Pharmacy Med Name: FARXIGA 5 MG TABLET] 90 tablet 1    Sig: TAKE 1 TABLET BY MOUTH DAILY BEFORE BREAKFAST.     Endocrinology:  Diabetes - SGLT2 Inhibitors Failed - 07/27/2023  9:29 AM      Failed - Cr in normal range and within 360 days    Creat  Date Value Ref Range Status  01/02/2023 1.15 (H) 0.60 - 1.00 mg/dL Final   Creatinine, Urine  Date Value Ref Range Status  05/31/2022 74 20 - 275 mg/dL Final         Failed - HBA1C is between 0 and 7.9 and within 180 days    Hemoglobin A1C  Date Value Ref Range Status  03/31/2019 6.6  Final   Hgb A1c MFr Bld  Date Value Ref Range Status  01/02/2023 7.8 (H) <5.7 % of total Hgb Final    Comment:    For someone without known diabetes, a hemoglobin A1c value of 6.5% or greater indicates that they may have  diabetes and this should be confirmed with a follow-up  test. . For someone with known diabetes, a value <7% indicates  that their diabetes is well controlled and a value  greater than or equal to 7% indicates suboptimal  control. A1c targets should be individualized based on  duration of diabetes, age, comorbid conditions, and  other considerations. . Currently, no consensus exists regarding use of hemoglobin A1c for diagnosis of diabetes for children. .          Failed - eGFR in normal range and within 360 days    GFR, Est African American  Date Value Ref Range Status  02/18/2020 101 > OR = 60 mL/min/1.66m2 Final   GFR, Est Non African American  Date Value Ref Range Status  02/18/2020 87 > OR = 60 mL/min/1.26m2 Final   GFR, Estimated  Date Value Ref Range Status  06/09/2022 56 (L) >60 mL/min Final    Comment:    (NOTE) Calculated using the CKD-EPI Creatinine Equation (2021)    eGFR  Date Value Ref Range Status  01/02/2023 49 (L) > OR = 60 mL/min/1.26m2 Final         Passed - Valid  encounter within last 6 months    Recent Outpatient Visits           4 weeks ago Upper respiratory tract infection, unspecified type   [facility] Mecum, Erin E, PA-C   1 month ago Intractable migraine with aura without status migrainosus   [facility] [name], [name]   2 months ago Intractable migraine with aura without status migrainosus   [facility] [name], [name]   3 months ago Acute viral labyrinthitis, unspecified laterality   [facility] [name], [name]   4 months ago Type 2 diabetes mellitus with microalbuminuria, without long-term current use of insulin [facility])   [facility] [name], [name]

## 2023-07-28 NOTE — Progress Notes (Signed)
Pharmacy Medication Assistance Program Note    07/28/2023  Patient ID: Lori Potts, female  DOB: Apr 08, 1946, 77 y.o.  MRN:  [id number]     07/27/2023 07/28/2023  Outreach Medication Three  Initial Outreach Date (Medication Three) 07/27/2023   Manufacturer Medication Three Lilly   [employer]illy Drugs Trulicity   Dose of Trulicity 0.75mg /0.40ml   Type of Radiographer, therapeutic Assistance   Date Application Sent to Patient 07/27/2023   Application Items Requested Application;Other   Date Application Sent to Prescriber 07/27/2023   Name of Prescriber [name]   Date Application Received From Patient  07/27/2023  Application Items Received From Patient  Application;Other  Date Application Received From Provider  07/28/2023  Date Application Submitted to Manufacturer  07/28/2023  Method Application Sent to Manufacturer  Fax       SIGNATURE  Kristopher Glee [facility]  Office: [phone] Fax: [phone] Email: [email or web address]

## 2023-07-28 NOTE — Patient Instructions (Signed)
Goals Addressed             This Visit's Progress    Pharmacy Goals       If you need to reach out to patient assistance programs regarding refills or to find out the status of your application, you can do so by calling:   For [place]ine [place] and [place] assistance, [employer] at: [phone] For Trulicity assistance, [employer] at: [phone]   The goal A1c is less than 7%. This is the best way to reduce the risk of the long term complications of diabetes, including heart disease, kidney disease, eye disease, strokes, and nerve damage. An A1c of less than 7% corresponds with fasting sugars less than 130 and 2 hour after meal sugars less than 180.    Thank you!    Estelle Grumbles, PharmD, [facility] [phone]

## 2023-07-31 ENCOUNTER — Other Ambulatory Visit: Payer: Self-pay | Admitting: Emergency Medicine

## 2023-07-31 DIAGNOSIS — G43119 Migraine with aura, intractable, without status migrainosus: Secondary | ICD-10-CM

## 2023-07-31 MED ORDER — BUTALBITAL-APAP-CAFFEINE 50-325-40 MG PO TABS: 1.0000 | ORAL_TABLET | Freq: Four times a day (QID) | ORAL | 0 refills | Status: DC | PRN

## 2023-07-31 MED ORDER — ONDANSETRON 4 MG PO TBDP: 4.0000 mg | ORAL_TABLET | Freq: Three times a day (TID) | ORAL | 0 refills | Status: DC | PRN

## 2023-08-08 DIAGNOSIS — G43809 Other migraine, not intractable, without status migrainosus: Secondary | ICD-10-CM | POA: Diagnosis not present

## 2023-08-08 DIAGNOSIS — H6993 Unspecified Eustachian tube disorder, bilateral: Secondary | ICD-10-CM | POA: Diagnosis not present

## 2023-08-09 ENCOUNTER — Other Ambulatory Visit: Payer: Self-pay | Admitting: Neurology

## 2023-08-09 DIAGNOSIS — G43809 Other migraine, not intractable, without status migrainosus: Secondary | ICD-10-CM

## 2023-08-22 DIAGNOSIS — M25551 Pain in right hip: Secondary | ICD-10-CM | POA: Diagnosis not present

## 2023-08-22 DIAGNOSIS — Z4889 Encounter for other specified surgical aftercare: Secondary | ICD-10-CM | POA: Diagnosis not present

## 2023-08-30 ENCOUNTER — Other Ambulatory Visit: Payer: Self-pay | Admitting: Nurse Practitioner

## 2023-08-30 ENCOUNTER — Encounter: Payer: Self-pay | Admitting: Nurse Practitioner

## 2023-08-30 ENCOUNTER — Ambulatory Visit
Admission: RE | Admit: 2023-08-30 | Discharge: 2023-08-30 | Disposition: A | Discharge status: Home / Self Care | Payer: Medicare Other | Source: Ambulatory Visit | Attending: Nurse Practitioner | Admitting: Nurse Practitioner

## 2023-08-30 ENCOUNTER — Ambulatory Visit (INDEPENDENT_AMBULATORY_CARE_PROVIDER_SITE_OTHER): Payer: Medicare Other | Admitting: Nurse Practitioner

## 2023-08-30 VITALS — BP 122/78 | HR 73 | Temp 98.0°F | Resp 18 | Ht 63.5 in | Wt 172.9 lb

## 2023-08-30 DIAGNOSIS — F419 Anxiety disorder, unspecified: Secondary | ICD-10-CM

## 2023-08-30 DIAGNOSIS — I10 Essential (primary) hypertension: Secondary | ICD-10-CM | POA: Diagnosis not present

## 2023-08-30 DIAGNOSIS — E1129 Type 2 diabetes mellitus with other diabetic kidney complication: Secondary | ICD-10-CM

## 2023-08-30 DIAGNOSIS — E1169 Type 2 diabetes mellitus with other specified complication: Secondary | ICD-10-CM

## 2023-08-30 DIAGNOSIS — F330 Major depressive disorder, recurrent, mild: Secondary | ICD-10-CM

## 2023-08-30 DIAGNOSIS — I700 Atherosclerosis of aorta: Secondary | ICD-10-CM

## 2023-08-30 DIAGNOSIS — R809 Proteinuria, unspecified: Secondary | ICD-10-CM | POA: Diagnosis not present

## 2023-08-30 DIAGNOSIS — R42 Dizziness and giddiness: Secondary | ICD-10-CM

## 2023-08-30 DIAGNOSIS — J4540 Moderate persistent asthma, uncomplicated: Secondary | ICD-10-CM

## 2023-08-30 DIAGNOSIS — H60312 Diffuse otitis externa, left ear: Secondary | ICD-10-CM

## 2023-08-30 DIAGNOSIS — N1831 Chronic kidney disease, stage 3a: Secondary | ICD-10-CM | POA: Diagnosis not present

## 2023-08-30 DIAGNOSIS — J018 Other acute sinusitis: Secondary | ICD-10-CM

## 2023-08-30 DIAGNOSIS — G43119 Migraine with aura, intractable, without status migrainosus: Secondary | ICD-10-CM

## 2023-08-30 MED ORDER — OFLOXACIN 0.3 % OT SOLN
5.0000 [drp] | Freq: Every day | OTIC | 0 refills | Status: DC
Start: 2023-08-30 — End: 2023-09-12

## 2023-08-30 MED ORDER — AMOXICILLIN-POT CLAVULANATE 875-125 MG PO TABS: 1.0000 | ORAL_TABLET | Freq: Two times a day (BID) | ORAL | 0 refills | Status: DC

## 2023-08-30 NOTE — Progress Notes (Signed)
 BP 122/78   Pulse 73   Temp 98 F (36.7 C)   Resp 18   Ht 5' 3.5 (1.613 m)   Wt 172 lb 14.4 oz (78.4 kg)   SpO2 94%   BMI 30.15 kg/m    Subjective:    Patient ID: Lori Potts, female    DOB: [birth year], 78 y.o.   MRN: [id number]  HPI: Lori Potts is a 78 y.o. female  Chief Complaint  Patient presents with   Ear Pain    Right ear w/ vertigo    Discussed the use of AI scribe software for clinical note transcription with the patient, who gave verbal consent to proceed.  History of Present Illness   The patient, with a history of migraines, hypertension, calcifications of aorta, asthma, type two diabetes, hyperlipidemia, chronic kidney disease, anxiety, and depression, presents with left ear pain and vertigo. She has a history of acute viral labyrinthitis and has experienced these symptoms multiple times before. She woke up two days ago with a severe headache and a pulling sensation to the left. She also reports dizzy spells in the morning. She has been bedridden due to these symptoms. She also reports a burning sensation on her face and forehead, which she suspects may be due to her medication.  She has a stimulator in her back, which has complicated her diagnostic imaging options. She is currently taking multiple medications including albuterol , Breztri , Buspar , Fioricet, Celexa , Farxiga , Trulicity , Nexium , losartan , Singulair , Propranolol , and rosuvastatin . Her last A1c was 7.8.       06/29/2023    8:17 AM 06/15/2023   10:11 AM 05/11/2023    1:00 PM  Depression screen PHQ 2/9  Decreased Interest 0 0 0  Down, Depressed, Hopeless 0 0 0  PHQ - 2 Score 0 0 0  Altered sleeping 0    Tired, decreased energy 0    Change in appetite 0    Feeling bad or failure about yourself  0    Trouble concentrating 0    Moving slowly or fidgety/restless 0    Suicidal thoughts 0    PHQ-9 Score 0    Difficult doing work/chores Not difficult at all      Relevant past medical,  surgical, family and social history reviewed and updated as indicated. Interim medical history since our last visit reviewed. Allergies and medications reviewed and updated.  Review of Systems  Ten systems reviewed and is negative except as mentioned in HPI      Objective:    BP 122/78   Pulse 73   Temp 98 F (36.7 C)   Resp 18   Ht 5' 3.5 (1.613 m)   Wt 172 lb 14.4 oz (78.4 kg)   SpO2 94%   BMI 30.15 kg/m    Wt Readings from Last 3 Encounters:  08/30/23 172 lb 14.4 oz (78.4 kg)  06/15/23 171 lb (77.6 kg)  05/11/23 171 lb 14.4 oz (78 kg)    Physical Exam  Constitutional: Patient appears well-developed and well-nourished. Obese  No distress.  HEENT: head atraumatic, normocephalic, pupils equal and reactive to light, tms clear, left ear canal erythematous,  neck supple, throat within normal limits Cardiovascular: Normal rate, regular rhythm and normal heart sounds.  No murmur heard. No BLE edema. Pulmonary/Chest: Effort normal and breath sounds normal. No respiratory distress. Abdominal: Soft.  There is no tenderness. Neuro exam: equal grip, romberg test negative,  no arm drift Psychiatric: Patient has a normal mood and affect.  behavior is normal. Judgment and thought content normal.  Results for orders placed or performed in visit on 08/01/23  HM DIABETES EYE EXAM   Collection Time: 07/13/23 12:00 AM  Result Value Ref Range   HM Diabetic Eye Exam No Retinopathy No Retinopathy       Assessment & Plan:   Problem List Items Addressed This Visit       Cardiovascular and Mediastinum   Essential (primary) hypertension   Relevant Orders   CBC with Differential/Platelet   COMPLETE METABOLIC PANEL WITH GFR   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HCC)   Intractable migraine with aura without status migrainosus     Respiratory   Moderate persistent asthma without complication     Endocrine   Type 2 diabetes mellitus with microalbuminuria, without long-term current use of insulin   (HCC) - Primary   Relevant Orders   COMPLETE METABOLIC PANEL WITH GFR   Hemoglobin A1c   Microalbumin / creatinine urine ratio   HM Diabetes Foot Exam (Completed)   Hyperlipidemia associated with type 2 diabetes mellitus (HCC)   Relevant Orders   COMPLETE METABOLIC PANEL WITH GFR   Lipid panel     Genitourinary   Stage 3a chronic kidney disease (HCC)   Relevant Orders   COMPLETE METABOLIC PANEL WITH GFR     Other   Anxiety   Mild episode of recurrent major depressive disorder (HCC)   Other Visit Diagnoses       Positive for microalbuminuria       Relevant Orders   Microalbumin / creatinine urine ratio     Dizziness       Relevant Orders   CT HEAD WO CONTRAST ( )     Acute diffuse otitis externa of left ear       Relevant Medications   ofloxacin  (FLOXIN ) 0.3 % OTIC solution        Assessment and Plan    dizziness/headache Acute onset of vertigo with a history of recurrent viral labyrinthitis. The current episode is characterized by severe headache and pulling sensation to the left. The patient is unable to drive due to the severity of symptoms. -this episode of dizziness is different than patient has experienced in the past -ct head stat -also recommend patient reach out to neurologist   Migraines Chronic condition managed with Topiramate   and Propranolol  20mg  three times a day. The patient is also under the care of a neurologist. -Continue current management plan.  Type 2 Diabetes Last A1c was 7.8 on Jan 02, 2023. The patient is on Farxiga  5mg  daily and Trulicity  0.75mg  weekly. -Order labs today to monitor A1c. -Encourage patient to monitor blood sugar levels regularly.  Hypertension Managed with Losartan  25mg  daily. -Continue current management plan.  Hyperlipidemia Managed with Rosuvastatin  40mg  daily. -Continue current management plan.  Anxiety and Depression Managed with Buspar  5mg  three times a day as needed and Celexa  40mg  daily. -Continue  current management plan.  Asthma Managed with Albuterol  inhaler and Breztri . -Continue current management plan.  Chronic Kidney Disease get glycemic control  Calcifications of Aorta continue statin  Acid Reflux Managed with Nexium  40mg  daily. -Continue current management plan.       Otitis externa -start ofloxacin    Follow up plan: Return for  follow up scheduled.

## 2023-09-04 ENCOUNTER — Telehealth: Payer: Self-pay | Admitting: Pharmacy Technician

## 2023-09-04 DIAGNOSIS — Z5986 Financial insecurity: Secondary | ICD-10-CM

## 2023-09-04 NOTE — Progress Notes (Signed)
 Pharmacy Medication Assistance Program Note    09/04/2023  Patient ID: Lori Potts, female  DOB: 1946/04/22, 78 y.o.  MRN:  [id number]     07/27/2023 07/28/2023 09/04/2023  Outreach Medication Three  Initial Outreach Date (Medication Three) 07/27/2023    Manufacturer Medication Three Lilly    [employer] Trulicity     Dose of Trulicity  0.75mg /0.27ml    Type of Assistance Manufacturer Assistance    Date Application Sent to Patient 07/27/2023    Application Items Requested Application;Other    Date Application Sent to Prescriber 07/27/2023    Name of Prescriber Mliss Spray    Date Application Received From Patient  07/27/2023   Application Items Received From Patient  Application;Other   Date Application Received From Provider  07/28/2023   Date Application Submitted to Manufacturer  07/28/2023   Method Application Sent to Manufacturer  Fax   Patient Assistance Determination   Approved  Approval Start Date   08/23/2023  Approval End Date   08/21/2024  Additional Outreach Contact   Provider   Care coordination call placed to [employer]. Spoke to Ama who informs patient is APPROVED 08/23/23-08/21/24 for Trulicity . Medication will auto fill and ship to patient's home address on application. Patient may call [employer] at any time to check on next shipment date by calling [phone].  Kate Caddy, CPhT [place]  Office: [phone] Fax: [phone] Email: [email or web address]

## 2023-09-05 NOTE — H&P (Signed)
 TOTAL HIP ADMISSION H&P  Patient is admitted for right total hip arthroplasty.  Subjective:  Chief Complaint: Right hip pain  HPI: Lori Potts Lower, 78 y.o. female, has a history of pain and functional disability in the right hip due to arthritis and patient has failed non-surgical conservative treatments for greater than 12 weeks to include NSAID's and/or analgesics, corticosteriod injections, and activity modification. Onset of symptoms was gradual, starting  several  years ago with gradually worsening course since that time. The patient noted no past surgery on the right hip. Patient currently rates pain in the right hip at 8 out of 10 with activity. Patient has night pain, worsening of pain with activity and weight bearing, pain that interfers with activities of daily living, and pain with passive range of motion. Patient has evidence of  significant joint space narrowing with osteophyte formation  by imaging studies. This condition presents safety issues increasing the risk of falls. There is no current active infection.  Patient Active Problem List   Diagnosis Date Noted   S/P lumbar fusion 01/30/2023   Stage 3a chronic kidney disease (HCC) 09/15/2022   OA (osteoarthritis) of hip 06/08/2022   Primary osteoarthritis of left hip 06/08/2022   Anxiety 04/07/2022   Palpitations 04/07/2022   Mild episode of recurrent major depressive disorder (HCC) 04/07/2022   Moderate persistent asthma without complication 02/16/2022   Intractable migraine with aura without status migrainosus 04/18/2020   Body mass index (BMI) 31.0-31.9, adult 02/06/2020   Chronic left hip pain 12/26/2019   Anemia 09/22/2017   S/P knee replacement 07/28/2017   Allergic rhinitis 05/26/2017   Vitamin D  deficiency 05/19/2017   Chronic nonintractable headache 05/18/2017   Advanced care planning/counseling discussion    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HCC) 03/02/2017   Insomnia 11/29/2016   Abdominal wall pain in right flank  03/29/2016   Diverticulosis 02/24/2016   Type 2 diabetes mellitus with microalbuminuria, without long-term current use of insulin  (HCC) 06/16/2015   Hyperlipidemia associated with type 2 diabetes mellitus (HCC) 06/16/2015   Chronic pain 04/13/2015   Neck pain 02/26/2015   Status post lumbar surgery 02/26/2015   Abnormal ECG 01/27/2015   Essential (primary) hypertension 01/27/2015    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   Cataract    right eye but immature   Essential hypertension, benign    takes Lisinopril -HCTZ daily   GERD (gastroesophageal reflux disease)    Headache    sinus   History of colon polyps    benign   History of migraine    History of shingles    Hyperlipidemia    takes Lipitor daily   Low back pain    Nocturia    S/P insertion of spinal cord stimulator    Seasonal allergies    takes Singulair  daily as needed   Type II or unspecified type diabetes mellitus without mention of complication, not stated as uncontrolled    Vitamin D  deficiency    takes Vit D weekly   Weakness    numbness and tingling left arm   Wears dentures    full upper and lower    Past Surgical History:  Procedure Laterality Date   ABDOMINAL HYSTERECTOMY     ANTERIOR CERVICAL DECOMP/DISCECTOMY FUSION N/A 02/26/2015   Procedure: ANTERIOR CERVICAL DISCECTOMY FUSION C4-5 (1 LEVEL);  Surgeon: Donaciano Sprang, MD;  Location: [facility] OR;  Service: Orthopedics;  Laterality:  N/A;   ANTERIOR LAT LUMBAR FUSION N/A 03/21/2013   Procedure: ANTERIOR LATERAL LUMBAR FUSION 1 LEVEL/ XLIF L3-L4 ;  Surgeon: Donaciano Sprang, MD;  Location: [facility] OR;  Service: Orthopedics;  Laterality: N/A;   ANTERIOR LATERAL LUMBAR FUSION WITH PERCUTANEOUS SCREW 1 LEVEL N/A 01/30/2023   Procedure: LATERAL INTERBODY FUSION LUMBAR TWO TO THREE;  Surgeon: Sprang Donaciano, MD;  Location: [facility] OR;  Service: Orthopedics;  Laterality: N/A;  5 HRS 3 C-BED LEFT TAP  BLOCK WITH EXPAREL    APPENDECTOMY     AUGMENTATION MAMMAPLASTY Bilateral 1978   BACK SURGERY     BACK SURGERY     Lumbar fusion x 2   BREAST EXCISIONAL BIOPSY Right 1970   BREAST SURGERY  1990   Augementation   CARDIAC CATHETERIZATION  5/12   ef 55%   CARDIAC CATHETERIZATION  10/2010   [facility]; EF 55%   CARDIAC CATHETERIZATION Left 02/16/2016   Procedure: Left Heart Cath and Coronary Angiography;  Surgeon: Cara JONETTA Lovelace, MD;  Location: [facility] IN[facility];  Service: Cardiovascular;  Laterality: Left;   COLONOSCOPY     COLONOSCOPY WITH PROPOFOL  N/A 04/10/2017   Procedure: COLONOSCOPY WITH PROPOFOL ;  Surgeon: Jinny Carmine, MD;  Location: [facility];  Service: Gastroenterology;  Laterality: N/A;  Diabetic - insulin    ESOPHAGOGASTRODUODENOSCOPY     JOINT REPLACEMENT     KIDNEY SURGERY  1998   growth removed from left kidney    LEFT HEART CATH AND CORONARY ANGIOGRAPHY Left 09/10/2018   Procedure: LEFT HEART CATH AND CORONARY ANGIOGRAPHY;  Surgeon: Lovelace Cara JONETTA, MD;  Location: [facility];  Service: Cardiovascular;  Laterality: Left;   pain stimulator     POSTERIOR CERVICAL FUSION/FORAMINOTOMY Right 03/21/2013   Procedure: POSTERIOR L2-3 RIGHT FORAMINOTOMY;  Surgeon: Donaciano Sprang, MD;  Location: [facility] OR;  Service: Orthopedics;  Laterality: Right;   REVISION OF SCAR TISSUE RECTUS MUSCLE     SHOULDER ARTHROSCOPY WITH ROTATOR CUFF REPAIR AND SUBACROMIAL DECOMPRESSION Right 09/17/2020   Procedure: RIGHT SHOULDER ARTHROSCOPY WITH MINI-OPEN ROTATOR CUFF REPAIR, DISTAL CLAVICLE EXCISION, AND SUBACROMIAL DECOMPRESSION WITH BICEP TENDONESIS;  Surgeon: Marchia Drivers, MD;  Location: [facility] ORS;  Service: Orthopedics;  Laterality: Right;   SMALL BOWEL REPAIR     SPINAL CORD STIMULATOR BATTERY EXCHANGE N/A 10/17/2012   Procedure: SPINAL CORD STIMULATOR BATTERY REMOVAL;  Surgeon: Donaciano Sprang, MD;  Location: [facility] OR;  Service: Orthopedics;  Laterality: N/A;   SPINAL CORD STIMULATOR  BATTERY EXCHANGE N/A 07/22/2015   Procedure: REIMPLANTATION OF SPINAL CORD STIMULATOR BATTERY ;  Surgeon: Donaciano Sprang, MD;  Location: [facility] OR;  Service: Orthopedics;  Laterality: N/A;   TOTAL HIP ARTHROPLASTY Left 06/08/2022   Procedure: TOTAL HIP ARTHROPLASTY ANTERIOR APPROACH;  Surgeon: Melodi Lerner, MD;  Location: [facility] ORS;  Service: Orthopedics;  Laterality: Left;   TOTAL KNEE ARTHROPLASTY Left    TOTAL KNEE ARTHROPLASTY Right 07/28/2017   Procedure: RIGHT TOTAL KNEE ARTHROPLASTY;  Surgeon: Gerome Charleston, MD;  Location: [facility] OR[facility];  Service: Orthopedics;  Laterality: Right;    Prior to Admission medications   Medication Sig Start Date End Date Taking? Authorizing Provider  albuterol  (PROVENTIL ) (2.5 MG/3ML) 0.083% nebulizer solution Take 3 mLs (2.5 mg total) by nebulization every 6 (six) hours as needed for wheezing or shortness of breath. 07/21/22   Pender, Julie F, [name]  albuterol  (VENTOLIN  HFA) 108 (90 Base) MCG/ACT inhaler Inhale 2 puffs into the lungs every 6 (six) hours as needed for wheezing or shortness of breath. 09/06/22  Gareth Mliss FALCON, [name]  Alcohol Swabs  (DROPSAFE ALCOHOL PREP) 70 % PADS USE TO TEST BLOOD SUGAR TWO TIMES DAILY 04/07/23   Pender, Julie F, [name]  amoxicillin -clavulanate (AUGMENTIN ) 875-125 MG tablet Take 1 tablet by mouth 2 (two) times daily. 08/30/23   Pender, Julie F, [name]  azelastine  (ASTELIN ) 0.1 % nasal spray PLACE 1 SPRAY INTO BOTH NOSTRILS 2 (TWO) TIMES DAILY. USE IN EACH NOSTRIL AS DIRECTED Patient taking differently: Place 1 spray into both nostrils 2 (two) times daily as needed for rhinitis or allergies. Use in each nostril as directed 12/28/20   Sowles, Krichna, MD  benzonatate  (TESSALON ) 100 MG capsule Take 1 capsule (100 mg total) by mouth 2 (two) times daily as needed for cough. 06/29/23   Mecum, Erin E, PA-C  Budeson-Glycopyrrol-Formoterol  (BREZTRI  AEROSPHERE) 160-9-4.8 MCG/ACT AERO Inhale 2 puffs into the lungs in the morning and at bedtime.    [provider]  busPIRone  (BUSPAR ) 5 MG tablet TAKE 1 TABLET THREE TIMES DAILY AS NEEDED 05/11/23   Pender, Julie F, [name]  butalbital -acetaminophen -caffeine  (FIORICET) 50-325-40 MG tablet Take 1 tablet by mouth every 6 (six) hours as needed for headache. 07/31/23   Pender, Julie F, [name]  citalopram  (CELEXA ) 40 MG tablet TAKE 1 TABLET BY MOUTH EVERY DAY 06/29/23   Pender, Julie F, [name]  dapagliflozin  propanediol (FARXIGA ) 5 MG TABS tablet Take 1 tablet (5 mg total) by mouth daily before breakfast. 05/11/23   Pender, Julie F, [name]  docusate sodium  (COLACE) 100 MG capsule Take 100 mg by mouth 2 (two) times daily.    [provider]  Dulaglutide  (TRULICITY ) 0.75 MG/0.5ML SOPN Inject 0.75 mg into the skin once a week. Patient receives via [employer] Patient Assistance through Dec 2023 03/20/23   Gareth Mliss FALCON, [name]  esomeprazole  (NEXIUM ) 40 MG capsule TAKE 1 CAPSULE AT BEDTIME 04/07/23   Pender, Julie F, [name]  losartan  (COZAAR ) 25 MG tablet TAKE 1 TABLET EVERY DAY 03/31/23   Pender, Julie F, [name]  montelukast  (SINGULAIR ) 10 MG tablet TAKE 1 TABLET BY MOUTH EVERYDAY AT BEDTIME 09/09/21   Pender, Julie F, [name]  ofloxacin  (FLOXIN ) 0.3 % OTIC solution Place 5 drops into the left ear daily. 08/30/23   Pender, Julie F, [name]  ondansetron  (ZOFRAN -ODT) 4 MG disintegrating tablet Take 1 tablet (4 mg total) by mouth every 8 (eight) hours as needed for nausea or vomiting. 07/31/23   Pender, Julie F, [name]  propranolol  (INDERAL ) 20 MG tablet TAKE 1 TABLET THREE TIMES DAILY 04/07/23   Pender, Julie F, [name]  rosuvastatin  (CRESTOR ) 40 MG tablet Take 1 tablet (40 mg total) by mouth daily. 06/12/23   Gareth Mliss FALCON, [name]  TRUEplus Lancets 33G MISC TEST BLOOD SUGAR TWO TIMES DAILY 04/07/23   Pender, Julie F, [name]  Ubrogepant  (UBRELVY ) 50 MG TABS Take 1 tablet (50 mg total) by mouth as needed (migrane). 06/21/23   Gareth Mliss FALCON, [name]    Allergies  Allergen Reactions   Metformin And Related Diarrhea   Adhesive [Tape] Rash and Other  (See Comments)    Regular tape is ok, allergy is to paper tape    Social History   Socioeconomic History   Marital status: Married    Spouse name: Elsie   Number of children: 3   Years of education: Not on file   Highest education level: Associate degree: occupational, scientist, product/process development, or vocational program  Occupational History   Not on file  Tobacco Use   Smoking status: Former  Current packs/day: 0.00    Types: Cigarettes    Quit date: 2006    Years since quitting: 19.0   Smokeless tobacco: Never   Tobacco comments:    quit smoking 40yrs ago  Vaping Use   Vaping status: Never Used  Substance and Sexual Activity   Alcohol use: No   Drug use: No   Sexual activity: Yes    Partners: Male    Birth control/protection: Surgical  Other Topics Concern   Not on file  Social History Narrative   Not on file   Social Drivers of Health   Financial Resource Strain: Low Risk  (03/30/2023)   Overall Financial Resource Strain (CARDIA)    Difficulty of Paying Living Expenses: Not hard at all  Food Insecurity: No Food Insecurity (03/30/2023)   Hunger Vital Sign    Worried About Running Out of Food in the Last Year: Never true    Ran Out of Food in the Last Year: Never true  Transportation Needs: No Transportation Needs (03/30/2023)   PRAPARE - Administrator, Civil Service (Medical): No    Lack of Transportation (Non-Medical): No  Physical Activity: Inactive (03/30/2023)   Exercise Vital Sign    Days of Exercise per Week: 0 days    Minutes of Exercise per Session: 0 min  Stress: No Stress Concern Present (03/30/2023)   [employer] of Occupational Health - Occupational Stress Questionnaire    Feeling of Stress : Not at all  Social Connections: Socially Integrated (03/30/2023)   Social Connection and Isolation Panel [NHANES]    Frequency of Communication with Friends and Family: More than three times a week    Frequency of Social Gatherings with Friends and Family: Twice a  week    Attends Religious Services: More than 4 times per year    Active Member of [employer] or Organizations: Yes    Attends Banker Meetings: 1 to 4 times per year    Marital Status: Married  Catering Manager Violence: Not At Risk (03/30/2023)   Humiliation, Afraid, Rape, and Kick questionnaire    Fear of Current or Ex-Partner: No    Emotionally Abused: No    Physically Abused: No    Sexually Abused: No    Tobacco Use: Medium Risk (08/30/2023)   Patient History    Smoking Tobacco Use: Former    Smokeless Tobacco Use: Never    Passive Exposure: Not on file   Social History   Substance and Sexual Activity  Alcohol Use No    Family History  Problem Relation Age of Onset   Heart attack Mother    Sarcoidosis Sister     Review of Systems  Constitutional:  Negative for chills and fever.  HENT:  Negative for congestion, sore throat and tinnitus.   Eyes:  Negative for double vision, photophobia and pain.  Respiratory:  Negative for cough, shortness of breath and wheezing.   Cardiovascular:  Negative for chest pain, palpitations and orthopnea.  Gastrointestinal:  Negative for heartburn, nausea and vomiting.  Genitourinary:  Negative for dysuria, frequency and urgency.  Musculoskeletal:  Positive for joint pain.  Neurological:  Negative for dizziness, weakness and headaches.     Objective:  Physical Exam: Well nourished and well developed.  General: Alert and oriented x3, cooperative and pleasant, no acute distress.  Head: normocephalic, atraumatic, neck supple.  Eyes: EOMI.  Musculoskeletal:  Right hip exam:  Mild tenderness palpation over the greater trochanter  Pain with passive range of  motion of the right hip  ROM 110 degrees flexion, 15 degrees IR, 15 degrees ER, 20 degrees abduction  Calves soft and nontender. Motor function intact in LE. Strength 5/5 LE bilaterally. Neuro: Distal pulses 2+. Sensation to light touch intact in LE.   Imaging  Review Plain radiographs demonstrate severe degenerative joint disease of the right hip. The bone quality appears to be adequate for age and reported activity level.  Assessment/Plan:  End stage arthritis, right hip  The patient history, physical examination, clinical judgement of the provider and imaging studies are consistent with end stage degenerative joint disease of the right hip and total hip arthroplasty is deemed medically necessary. The treatment options including medical management, injection therapy, arthroscopy and arthroplasty were discussed at length. The risks and benefits of total hip arthroplasty were presented and reviewed. The risks due to aseptic loosening, infection, stiffness, dislocation/subluxation, thromboembolic complications and other imponderables were discussed. The patient acknowledged the explanation, agreed to proceed with the plan and consent was signed. Patient is being admitted for inpatient treatment for surgery, pain control, PT, OT, prophylactic antibiotics, VTE prophylaxis, progressive ambulation and ADLs and discharge planning.The patient is planning to be discharged  home .   Patient's anticipated LOS is less than 2 midnights, meeting these requirements: - Younger than 17 - Lives within 1 hour of care - Has a competent adult at home to recover with post-op recover - NO history of  - Coronary Artery Disease  - Heart failure  - Heart attack  - Stroke  - DVT/VTE  - Cardiac arrhythmia  - Respiratory Failure/COPD  - Renal failure  - Anemia  - Advanced Liver disease  Therapy Plans: HEP Disposition: Home with husband Planned DVT Prophylaxis: Aspirin  81 mg BID DME Needed: None PCP: Mliss Spray, [name] (LOV 08/30/2023) Cardiologist: Cara Lovelace, MD (clearance received) TXA: IV Allergies: Adhesive tape Anesthesia Concerns: None BMI: 33.3 Last HgbA1c: See below Pharmacy: [facility] ([place])  Other: - Getting labs with Mliss on 09/12/23, rechecking A1c at  that time. Was 7.8% in May, patient aware that threshold for surgery is 7.7% - Oxycodone  10 mg TID. Discussed dilaudid  postoperatively, same protocol w/ left THA in 2023.  - Recent issues with dizziness, had head CT which came back negative   - Patient was instructed on what medications to stop prior to surgery. - Follow-up visit in 2 weeks with Dr. Melodi - Begin physical therapy following surgery - Pre-operative lab work as pre-surgical testing - Prescriptions will be provided in hospital at time of discharge  Roxie Mess, PA-C Orthopedic Surgery [facility] [place]

## 2023-09-06 ENCOUNTER — Other Ambulatory Visit: Payer: Self-pay | Admitting: Neurology

## 2023-09-06 ENCOUNTER — Other Ambulatory Visit: Payer: Self-pay | Admitting: Pharmacist

## 2023-09-06 DIAGNOSIS — G43809 Other migraine, not intractable, without status migrainosus: Secondary | ICD-10-CM

## 2023-09-06 NOTE — Progress Notes (Signed)
 09/06/2023 Name: Lori Potts MRN: [id number] DOB: 03/10/1946  Chief Complaint  Patient presents with   Medication Assistance   Medication Management    Lori Potts is a 78 y.o. year old female who presented for a telephone visit.   They were referred to the pharmacist by their PCP for assistance in managing medication access.      Subjective:   Care Team: Primary Care Provider: Quinton Buckler, [name]; Next Scheduled Visit: 09/12/2023 Cardiologist: Barbie Boon, MD; Next Scheduled Visit: 01/04/2024 Neurologist: Herold Lora, MD; Initial Appointment: 10/19/2023    Medication Access/Adherence  Current Pharmacy:  [facility] [address] [place] - [address] [place] [address] Phone: [phone] Fax: [phone]  [facility] - [place], [place] - [address] [place] [address] Phone: [phone] Fax: [phone]  [id number] [place], [place] - [address] [place] [address] Phone: [phone] Fax: [phone]  [facility] - [place], [place] - [address] [place] [address] Phone: [phone] Fax: [phone]   Patient reports affordability concerns with their medications: No Patient reports access/transportation concerns to their pharmacy: No  Patient reports adherence concerns with their medications:  No     From review of chart, note patient recently started on Augmentin  for sinusitis    Reports has noticed an improvement in sinus symptoms with taking Augmentin   Diabetes:   Current medications:  - Farxiga  5 mg daily - Trulicity  0.75 mg weekly (restarted ~2 weeks ago)   Medications tried in the past: Ozempic    Current glucose readings: before breakfast ranging primarily 88-129   Patient denies hypoglycemic s/sx including dizziness, shakiness, sweating.     Current physical activity: stays active  through the home throughout the day, but often limited by back pain   Current medication access support:  - Enrolled in patient assistance for Farxiga  through [employer] and Trulicity  patient assistance from [employer] through 08/21/2024  Today patient reports that she received a shipment of her Trulicity  from the assistance program at the beginning of the month   Hypertension:   Current medications:  - losartan  25 mg daily - propranolol  20 mg three times daily   Patient has a validated, automated, upper arm home BP cuff Current blood pressure readings readings: last yesterday 129/67   Patient recent denies hypotensive s/sx including dizziness, lightheadedness.    Current physical activity: movement limited by hip pain, but stays active throughout the day     Asthma and Allergic Rhinitis:   Current medications: - Albuterol  HFA 2 puffs every 6 hours as needed  - Breztri inhaler - 2 puffs twice daily             Confirms rinses and spits out after each use - Montelukast  10 mg nightly  - Azelastine  nasal spray twice daily as needed   Medications tried in the past: Breo, Trelegy (cost)   Reports breathing has been good recently; denies needing albuterol  rescue inhaler recently   Current medication access support:  - Enrolled in patient assistance for Breztri through [employer] through 08/21/2024     Objective:  Lab Results  Component Value Date   HGBA1C 7.8 (H) 01/02/2023    Lab Results  Component Value Date   CREATININE 1.15 (H) 01/02/2023   BUN 20 01/02/2023   NA 141 01/02/2023   K 3.9 01/02/2023   CL  105 01/02/2023   CO2 26 01/02/2023    Lab Results  Component Value Date   CHOL 156 09/15/2022   HDL 58 09/15/2022   LDLCALC 81 09/15/2022   TRIG 91 09/15/2022   CHOLHDL 2.7 09/15/2022   BP Readings from Last 3 Encounters:  08/30/23 122/78  05/11/23 124/72  03/20/23 124/76   Pulse Readings from Last 3 Encounters:  08/30/23 73  05/11/23 75  03/20/23 77      Medications Reviewed Today     Reviewed by Ardis Becton, RPH-CPP (Pharmacist) on 09/06/23 at 1544  Med List Status: <None>   Medication Order Taking? Sig Documenting Provider Last Dose Status Informant  albuterol  (PROVENTIL ) (2.5 MG/3ML) 0.083% nebulizer solution [id number]  Take 3 mLs (2.5 mg total) by nebulization every 6 (six) hours as needed for wheezing or shortness of breath. Pender, Julie F, [name]  Active Self  albuterol  (VENTOLIN  HFA) 108 (90 Base) MCG/ACT inhaler [id number]  Inhale 2 puffs into the lungs every 6 (six) hours as needed for wheezing or shortness of breath. Quinton Buckler, [name]  Active Self  Alcohol Swabs  (DROPSAFE ALCOHOL PREP) 70 % PADS [id number]  USE TO TEST BLOOD SUGAR TWO TIMES DAILY Pender, Julie F, [name]  Active   amoxicillin -clavulanate (AUGMENTIN ) 875-125 MG tablet [id number] Yes Take 1 tablet by mouth 2 (two) times daily. Quinton Buckler, [name] Taking Active   azelastine  (ASTELIN ) 0.1 % nasal spray [id number]  PLACE 1 SPRAY INTO BOTH NOSTRILS 2 (TWO) TIMES DAILY. USE IN EACH NOSTRIL AS DIRECTED  Patient taking differently: Place 1 spray into both nostrils 2 (two) times daily as needed for rhinitis or allergies. Use in each nostril as directed   Arleen Lacer, MD  Active Self  benzonatate  (TESSALON ) 100 MG capsule [id number]  Take 1 capsule (100 mg total) by mouth 2 (two) times daily as needed for cough. Mecum, Erin E, PA-C  Active   Budeson-Glycopyrrol-Formoterol  (BREZTRI AEROSPHERE) 160-9-4.8 MCG/ACT AERO [id number]  Inhale 2 puffs into the lungs in the morning and at bedtime. [provider]  Active Self  busPIRone  (BUSPAR ) 5 MG tablet [id number]  TAKE 1 TABLET THREE TIMES DAILY AS NEEDED Pender, Julie F, [name]  Active   butalbital -acetaminophen -caffeine  (FIORICET) 50-325-40 MG tablet [id number]  Take 1 tablet by mouth every 6 (six) hours as needed for headache. Quinton Buckler, [name]  Active   citalopram  (CELEXA ) 40 MG tablet [id number]  TAKE 1 TABLET BY  MOUTH EVERY DAY Pender, Julie F, [name]  Active   dapagliflozin  propanediol (FARXIGA ) 5 MG TABS tablet [id number] Yes Take 1 tablet (5 mg total) by mouth daily before breakfast. Quinton Buckler, [name] Taking Active   docusate sodium  (COLACE) 100 MG capsule [id number]  Take 100 mg by mouth 2 (two) times daily. [provider]  Active Self  Dulaglutide  (TRULICITY ) 0.75 MG/0.5ML SOPN [id number] Yes Inject 0.75 mg into the skin once a week. Patient receives via [employer] Patient Assistance through Dec 2023 Quinton Buckler, [name] Taking Active   esomeprazole  (NEXIUM ) 40 MG capsule [id number]  TAKE 1 CAPSULE AT BEDTIME Pender, Julie F, [name]  Active   losartan  (COZAAR ) 25 MG tablet [id number]  TAKE 1 TABLET EVERY DAY Pender, Julie F, [name]  Active   montelukast  (SINGULAIR ) 10 MG tablet [id number]  TAKE 1 TABLET BY MOUTH EVERYDAY AT BEDTIME Pender, Julie F, [name]  Active Self  ofloxacin  (FLOXIN ) 0.3 % OTIC solution [id number]  Place 5 drops into the left ear daily. Quinton Buckler, [name]  Active   ondansetron  (ZOFRAN -ODT) 4 MG disintegrating tablet [id number]  Take 1 tablet (4 mg total) by mouth every 8 (eight) hours as needed for nausea or vomiting. Pender, Julie F, [name]  Active   propranolol  (INDERAL ) 20 MG tablet [id number] Yes TAKE 1 TABLET THREE TIMES DAILY Pender, Julie F, [name] Taking Active   rosuvastatin  (CRESTOR ) 40 MG tablet [id number]  Take 1 tablet (40 mg total) by mouth daily. Quinton Buckler, [name]  Active   TRUEplus Lancets 33G MISC [id number]  TEST BLOOD SUGAR TWO TIMES DAILY Pender, Julie F, [name]  Active   Ubrogepant  (UBRELVY ) 50 MG TABS [id number]  Take 1 tablet (50 mg total) by mouth as needed (migrane). Quinton Buckler, [name]  Active               Assessment/Plan:   Diabetes: - Reviewed long term cardiovascular and renal outcomes of uncontrolled blood sugar - Reviewed goal A1c, goal fasting, and goal 2 hour post prandial glucose - Reviewed dietary modifications including importance of having regular  well-balanced meals while controlling carbohydrate portion sizes - Recommend to check glucose, keep log of results and have this record to review during future medical appointments - Patient to follow up with [employer] as needed for refills of Trulicity  and [employer] as needed for refills of Farxiga    Hypertension: - Reviewed long term cardiovascular and renal outcomes of uncontrolled blood pressure - Recommended to check home blood pressure and heart rate, keep log of results and have this record to review during future medical appointments     Asthma: - Reviewed appropriate inhaler technique.  - Patient to follow up with [employer] as needed for refills of Breztri   Follow Up Plan: Clinical Pharmacist will follow up with patient by telephone on 09/06/2023 at 3:30 PM    Arthur Lash, PharmD, [facility] [phone]

## 2023-09-11 NOTE — Progress Notes (Unsigned)
   There were no vitals taken for this visit.   Subjective:    Patient ID: Lori Potts, female    DOB: [birth year], 78 y.o.   MRN: [id number]  HPI: Lori Potts is a 78 y.o. female  No chief complaint on file.   Discussed the use of AI scribe software for clinical note transcription with the patient, who gave verbal consent to proceed.  History of Present Illness           06/29/2023    8:17 AM 06/15/2023   10:11 AM 05/11/2023    1:00 PM  Depression screen PHQ 2/9  Decreased Interest 0 0 0  Down, Depressed, Hopeless 0 0 0  PHQ - 2 Score 0 0 0  Altered sleeping 0    Tired, decreased energy 0    Change in appetite 0    Feeling bad or failure about yourself  0    Trouble concentrating 0    Moving slowly or fidgety/restless 0    Suicidal thoughts 0    PHQ-9 Score 0    Difficult doing work/chores Not difficult at all      Relevant past medical, surgical, family and social history reviewed and updated as indicated. Interim medical history since our last visit reviewed. Allergies and medications reviewed and updated.  Review of Systems  Per HPI unless specifically indicated above     Objective:    There were no vitals taken for this visit.  {Vitals History (Optional):23777} Wt Readings from Last 3 Encounters:  08/30/23 172 lb 14.4 oz (78.4 kg)  06/15/23 171 lb (77.6 kg)  05/11/23 171 lb 14.4 oz (78 kg)    Physical Exam  Results for orders placed or performed in visit on 08/01/23  HM DIABETES EYE EXAM   Collection Time: 07/13/23 12:00 AM  Result Value Ref Range   HM Diabetic Eye Exam No Retinopathy No Retinopathy   {Labs (Optional):23779}    Assessment & Plan:   Problem List Items Addressed This Visit   None    Assessment and Plan             Follow up plan: No follow-ups on file.

## 2023-09-12 ENCOUNTER — Ambulatory Visit: Payer: Medicare Other | Admitting: Nurse Practitioner

## 2023-09-12 ENCOUNTER — Encounter: Payer: Self-pay | Admitting: Nurse Practitioner

## 2023-09-12 VITALS — BP 122/68 | HR 85 | Temp 97.8°F | Resp 18 | Ht 63.5 in | Wt 173.6 lb

## 2023-09-12 DIAGNOSIS — R809 Proteinuria, unspecified: Secondary | ICD-10-CM | POA: Diagnosis not present

## 2023-09-12 DIAGNOSIS — E1169 Type 2 diabetes mellitus with other specified complication: Secondary | ICD-10-CM

## 2023-09-12 DIAGNOSIS — E1129 Type 2 diabetes mellitus with other diabetic kidney complication: Secondary | ICD-10-CM | POA: Diagnosis not present

## 2023-09-12 DIAGNOSIS — I10 Essential (primary) hypertension: Secondary | ICD-10-CM | POA: Diagnosis not present

## 2023-09-12 DIAGNOSIS — E785 Hyperlipidemia, unspecified: Secondary | ICD-10-CM

## 2023-09-12 DIAGNOSIS — Z01818 Encounter for other preprocedural examination: Secondary | ICD-10-CM | POA: Diagnosis not present

## 2023-09-12 DIAGNOSIS — R42 Dizziness and giddiness: Secondary | ICD-10-CM

## 2023-09-12 DIAGNOSIS — Z7984 Long term (current) use of oral hypoglycemic drugs: Secondary | ICD-10-CM

## 2023-09-12 DIAGNOSIS — J4540 Moderate persistent asthma, uncomplicated: Secondary | ICD-10-CM

## 2023-09-12 LAB — POCT GLYCOSYLATED HEMOGLOBIN (HGB A1C): Hemoglobin A1C: 7.1 % — AB (ref 4.0–5.6)

## 2023-09-12 MED ORDER — MECLIZINE HCL 25 MG PO TABS: 25.0000 mg | ORAL_TABLET | Freq: Three times a day (TID) | ORAL | 0 refills | Status: DC | PRN

## 2023-09-12 MED ORDER — FREESTYLE LIBRE 3 SENSOR MISC: 1.0000 | 11 refills | Status: DC

## 2023-09-13 ENCOUNTER — Encounter: Payer: Self-pay | Admitting: Nurse Practitioner

## 2023-09-13 LAB — CBC WITH DIFFERENTIAL/PLATELET
Absolute Lymphocytes: 1248 {cells}/uL (ref 850–3900)
Absolute Monocytes: 390 {cells}/uL (ref 200–950)
Basophils Absolute: 48 {cells}/uL (ref 0–200)
Basophils Relative: 0.8 %
Eosinophils Absolute: 210 {cells}/uL (ref 15–500)
Eosinophils Relative: 3.5 %
HCT: 37.7 % (ref 35.0–45.0)
Hemoglobin: 11.6 g/dL — ABNORMAL LOW (ref 11.7–15.5)
MCH: 23.7 pg — ABNORMAL LOW (ref 27.0–33.0)
MCHC: 30.8 g/dL — ABNORMAL LOW (ref 32.0–36.0)
MCV: 77.1 fL — ABNORMAL LOW (ref 80.0–100.0)
MPV: 11.1 fL (ref 7.5–12.5)
Monocytes Relative: 6.5 %
Neutro Abs: 4104 {cells}/uL (ref 1500–7800)
Neutrophils Relative %: 68.4 %
Platelets: 489 10*3/uL — ABNORMAL HIGH (ref 140–400)
RBC: 4.89 10*6/uL (ref 3.80–5.10)
RDW: 16.8 % — ABNORMAL HIGH (ref 11.0–15.0)
Total Lymphocyte: 20.8 %
WBC: 6 10*3/uL (ref 3.8–10.8)

## 2023-09-13 LAB — COMPLETE METABOLIC PANEL WITH GFR
AG Ratio: 1.7 (calc) (ref 1.0–2.5)
ALT: 19 U/L (ref 6–29)
AST: 22 U/L (ref 10–35)
Albumin: 4.3 g/dL (ref 3.6–5.1)
Alkaline phosphatase (APISO): 89 U/L (ref 37–153)
BUN: 17 mg/dL (ref 7–25)
CO2: 28 mmol/L (ref 20–32)
Calcium: 10.1 mg/dL (ref 8.6–10.4)
Chloride: 108 mmol/L (ref 98–110)
Creat: 0.98 mg/dL (ref 0.60–1.00)
Globulin: 2.5 g/dL (ref 1.9–3.7)
Glucose, Bld: 81 mg/dL (ref 65–99)
Potassium: 4.1 mmol/L (ref 3.5–5.3)
Sodium: 144 mmol/L (ref 135–146)
Total Bilirubin: 0.2 mg/dL (ref 0.2–1.2)
Total Protein: 6.8 g/dL (ref 6.1–8.1)
eGFR: 59 mL/min/{1.73_m2} — ABNORMAL LOW (ref >=60)

## 2023-09-13 LAB — LIPID PANEL
Cholesterol: 173 mg/dL (ref <200)
HDL: 71 mg/dL (ref >=50)
LDL Cholesterol (Calc): 87 mg/dL
Non-HDL Cholesterol (Calc): 102 mg/dL (ref <130)
Total CHOL/HDL Ratio: 2.4 (calc) (ref <5.0)
Triglycerides: 63 mg/dL (ref <150)

## 2023-09-13 LAB — MICROALBUMIN / CREATININE URINE RATIO
Creatinine, Urine: 93 mg/dL (ref 20–275)
Microalb Creat Ratio: 104 mg/g{creat} — ABNORMAL HIGH (ref <30)
Microalb, Ur: 9.7 mg/dL

## 2023-09-13 NOTE — Progress Notes (Signed)
Anesthesia Review:  PCP: [name], NP preop exam on 09/12/23  Cardiologist : Chest x-ray : EKG : Echo : 11/22/21  Stress test: Cardiac Cath :  2020  Activity level:  Sleep Study/ CPAP : Fasting Blood Sugar :      / Checks Blood Sugar -- times a day:   Blood Thinner/ Instructions /Last Dose: ASA / Instructions/ Last Dose :    DM- type Hgb A1c- 09/12/23- 7.1    CBC/DIFF amd CMP done 09/12/23

## 2023-09-13 NOTE — Patient Instructions (Signed)
SURGICAL WAITING ROOM VISITATION  Patients having surgery or a procedure may have no more than 2 support people in the waiting area - these visitors may rotate.    Children under the age of 65 must have an adult with them who is not the patient.  Due to an increase in RSV and influenza rates and associated hospitalizations, children ages 23 and under may not visit patients in [facility] hospitals.  Visitors with respiratory illnesses are discouraged from visiting and should remain at home.  If the patient needs to stay at the hospital during part of their recovery, the visitor guidelines for inpatient rooms apply. Pre-op nurse will coordinate an appropriate time for 1 support person to accompany patient in pre-op.  This support person may not rotate.    Please refer to the [facility] website for the visitor guidelines for Inpatients (after your surgery is over and you are in a regular room).       Your procedure is scheduled on:  09/27/2023    Report to [facility] Main Entrance    Report to admitting at   0600AM   Call this number if you have problems the morning of surgery [phone]   Do not eat food :After Midnight.   After Midnight you may have the following liquids until _ 0530_____ AM DAY OF SURGERY  Water Non-Citrus Juices (without pulp, NO RED-Apple, White grape, White cranberry) Black Coffee (NO MILK/CREAM OR CREAMERS, sugar ok)  Clear Tea (NO MILK/CREAM OR CREAMERS, sugar ok) regular and decaf                             Plain Jell-O (NO RED)                                           Fruit ices (not with fruit pulp, NO RED)                                     Popsicles (NO RED)                                                               Sports drinks like Gatorade (NO RED)                   The day of surgery:  Drink ONE (1) Pre-Surgery Clear Ensure or G2 at  0530AM ( have completed by )  the morning of surgery. Drink in one sitting. Do not sip.  This  drink was given to you during your hospital  pre-op appointment visit. Nothing else to drink after completing the  Pre-Surgery Clear Ensure or G2.          If you have questions, please contact your surgeon's office.       Oral Hygiene is also important to reduce your risk of infection.  Remember - BRUSH YOUR TEETH THE MORNING OF SURGERY WITH YOUR REGULAR TOOTHPASTE  DENTURES WILL BE REMOVED PRIOR TO SURGERY PLEASE DO NOT APPLY "Poly grip" OR ADHESIVES!!!   Do NOT smoke after Midnight   Stop all vitamins and herbal supplements 7 days before surgery.   Take these medicines the morning of surgery with A SIP OF WATER:  , inhalers as usual and birng, buspar if needed,propanolol           Farxiga- hold for 72 hours prior to surgery.  Last dose on 09/23/23.  Trulicity - Hold for 7  days prior to surgery .  Last  dose on  DO NOT TAKE ANY ORAL DIABETIC MEDICATIONS DAY OF YOUR SURGERY  Bring CPAP mask and tubing day of surgery.                              You may not have any metal on your body including hair pins, jewelry, and body piercing             Do not wear make-up, lotions, powders, perfumes/cologne, or deodorant  Do not wear nail polish including gel and S&S, artificial/acrylic nails, or any other type of covering on natural nails including finger and toenails. If you have artificial nails, gel coating, etc. that needs to be removed by a nail salon please have this removed prior to surgery or surgery may need to be canceled/ delayed if the surgeon/ anesthesia feels like they are unable to be safely monitored.   Do not shave  48 hours prior to surgery.               Men may shave face and neck.   Do not bring valuables to the hospital.  IS NOT             RESPONSIBLE   FOR VALUABLES.   Contacts, glasses, dentures or bridgework may not be worn into surgery.   Bring small overnight bag day of surgery.   DO NOT BRING YOUR HOME  MEDICATIONS TO THE HOSPITAL. PHARMACY WILL DISPENSE MEDICATIONS LISTED ON YOUR MEDICATION LIST TO YOU DURING YOUR ADMISSION IN THE HOSPITAL!    Patients discharged on the day of surgery will not be allowed to drive home.  Someone NEEDS to stay with you for the first 24 hours after anesthesia.   Special Instructions: Bring a copy of your healthcare power of attorney and living will documents the day of surgery if you haven't scanned them before.              Please read over the following fact sheets you were given: IF YOU HAVE QUESTIONS ABOUT YOUR PRE-OP INSTRUCTIONS PLEASE CALL [phone]   If you received a COVID test during your pre-op visit  it is requested that you wear a mask when out in public, stay away from anyone that may not be feeling well and notify your surgeon if you develop symptoms. If you test positive for Covid or have been in contact with anyone that has tested positive in the last 10 days please notify you surgeon.      Pre-operative 5 CHG Bath Instructions   You can play a key role in reducing the risk of infection after surgery. Your skin needs to be as free of germs as possible. You can reduce the number of germs on your skin by washing with CHG (chlorhexidine gluconate) soap before surgery. CHG is  an antiseptic soap that kills germs and continues to kill germs even after washing.   DO NOT use if you have an allergy to chlorhexidine/CHG or antibacterial soaps. If your skin becomes reddened or irritated, stop using the CHG and notify one of our RNs at [phone].   Please shower with the CHG soap starting 4 days before surgery using the following schedule:     Please keep in mind the following:  DO NOT shave, including legs and underarms, starting the day of your first shower.   You may shave your face at any point before/day of surgery.  Place clean sheets on your bed the day you start using CHG soap. Use a clean washcloth (not used since being washed) for each  shower. DO NOT sleep with pets once you start using the CHG.   CHG Shower Instructions:  If you choose to wash your hair and private area, wash first with your normal shampoo/soap.  After you use shampoo/soap, rinse your hair and body thoroughly to remove shampoo/soap residue.  Turn the water OFF and apply about 3 tablespoons (45 ml) of CHG soap to a CLEAN washcloth.  Apply CHG soap ONLY FROM YOUR NECK DOWN TO YOUR TOES (washing for 3-5 minutes)  DO NOT use CHG soap on face, private areas, open wounds, or sores.  Pay special attention to the area where your surgery is being performed.  If you are having back surgery, having someone wash your back for you may be helpful. Wait 2 minutes after CHG soap is applied, then you may rinse off the CHG soap.  Pat dry with a clean towel  Put on clean clothes/pajamas   If you choose to wear lotion, please use ONLY the CHG-compatible lotions on the back of this paper.     Additional instructions for the day of surgery: DO NOT APPLY any lotions, deodorants, cologne, or perfumes.   Put on clean/comfortable clothes.  Brush your teeth.  Ask your nurse before applying any prescription medications to the skin.      CHG Compatible Lotions   Aveeno Moisturizing lotion  Cetaphil Moisturizing Cream  Cetaphil Moisturizing Lotion  Clairol Herbal Essence Moisturizing Lotion, Dry Skin  Clairol Herbal Essence Moisturizing Lotion, Extra Dry Skin  Clairol Herbal Essence Moisturizing Lotion, Normal Skin  Curel Age Defying Therapeutic Moisturizing Lotion with Alpha Hydroxy  Curel Extreme Care Body Lotion  Curel Soothing Hands Moisturizing Hand Lotion  Curel Therapeutic Moisturizing Cream, Fragrance-Free  Curel Therapeutic Moisturizing Lotion, Fragrance-Free  Curel Therapeutic Moisturizing Lotion, Original Formula  Eucerin Daily Replenishing Lotion  Eucerin Dry Skin Therapy Plus Alpha Hydroxy Crme  Eucerin Dry Skin Therapy Plus Alpha Hydroxy Lotion   Eucerin Original Crme  Eucerin Original Lotion  Eucerin Plus Crme Eucerin Plus Lotion  Eucerin TriLipid Replenishing Lotion  Keri Anti-Bacterial Hand Lotion  Keri Deep Conditioning Original Lotion Dry Skin Formula Softly Scented  Keri Deep Conditioning Original Lotion, Fragrance Free Sensitive Skin Formula  Keri Lotion Fast Absorbing Fragrance Free Sensitive Skin Formula  Keri Lotion Fast Absorbing Softly Scented Dry Skin Formula  Keri Original Lotion  Keri Skin Renewal Lotion Keri Silky Smooth Lotion  Keri Silky Smooth Sensitive Skin Lotion  Nivea Body Creamy Conditioning Oil  Nivea Body Extra Enriched Teacher, adult education Moisturizing Lotion Nivea Crme  Nivea Skin Firming Lotion  NutraDerm 30 Skin Lotion  NutraDerm Skin Lotion  NutraDerm Therapeutic Skin Cream  NutraDerm Therapeutic Skin Lotion  ProShield Protective Hand Cream  Provon moisturizing lotion

## 2023-09-19 ENCOUNTER — Encounter (HOSPITAL_COMMUNITY): Payer: Self-pay

## 2023-09-19 ENCOUNTER — Encounter (HOSPITAL_COMMUNITY)
Admission: RE | Admit: 2023-09-19 | Discharge: 2023-09-19 | Disposition: A | Discharge status: Home / Self Care | Payer: Medicare Other | Source: Ambulatory Visit | Attending: Orthopedic Surgery | Admitting: Orthopedic Surgery

## 2023-09-19 ENCOUNTER — Other Ambulatory Visit: Payer: Self-pay

## 2023-09-19 VITALS — BP 139/65 | HR 66 | Temp 98.4°F | Ht 63.5 in | Wt 171.0 lb

## 2023-09-19 DIAGNOSIS — Z01818 Encounter for other preprocedural examination: Secondary | ICD-10-CM | POA: Diagnosis present

## 2023-09-19 DIAGNOSIS — E139 Other specified diabetes mellitus without complications: Secondary | ICD-10-CM | POA: Diagnosis not present

## 2023-09-19 HISTORY — DX: Dyspnea, unspecified: R06.00

## 2023-09-19 LAB — GLUCOSE, CAPILLARY: Glucose-Capillary: 116 mg/dL — ABNORMAL HIGH (ref 70–99)

## 2023-09-19 LAB — SURGICAL PCR SCREEN
MRSA, PCR: NEGATIVE
Staphylococcus aureus: NEGATIVE

## 2023-09-19 NOTE — Progress Notes (Signed)
For Anesthesia: PCP - [name], NP preop exam on 09/12/23  Cardiologist - N/A  Bowel Prep reminder:  Chest x-ray -  EKG - 09/19/23 Stress Test -  ECHO -11/22/21   Cardiac Cath - 2020 Pacemaker/ICD device last checked: Pacemaker orders received: Device Rep notified:  Spinal Cord Stimulator: IN; it;s not working right now.  Sleep Study - N/A CPAP -   Fasting Blood Sugar - 100's Checks Blood Sugar _3____ times a day Date and result of last Hgb A1c-7/1: 09/12/23  Last dose of GLP1 agonist- Trulicity GLP1 instructions: To hold after 09/19/23  Last dose of SGLT-2 inhibitors- Farxiga SGLT-2 instructions: To hold after: 09/23/23  Blood Thinner Instructions: Aspirin Instructions: It's on hold. Last Dose:  Activity level: Can go up a flight of stairs and activities of daily living without stopping and without chest pain and/or shortness of breath   Able to exercise without chest pain and/or shortness of breath     Anesthesia review: Hx: HTN,DIA.  Patient denies shortness of breath, fever, cough and chest pain at PAT appointment   Patient verbalized understanding of instructions that were given to them at the PAT appointment. Patient was also instructed that they will need to review over the PAT instructions again at home before surgery.

## 2023-09-27 ENCOUNTER — Observation Stay (HOSPITAL_COMMUNITY)
Admission: RE | Admit: 2023-09-27 | Discharge: 2023-09-28 | Disposition: A | Discharge status: Home / Self Care | Payer: Medicare HMO | Source: Ambulatory Visit | Attending: Orthopedic Surgery | Admitting: Orthopedic Surgery

## 2023-09-27 ENCOUNTER — Ambulatory Visit (HOSPITAL_COMMUNITY): Payer: Medicare HMO

## 2023-09-27 ENCOUNTER — Other Ambulatory Visit: Payer: Self-pay

## 2023-09-27 ENCOUNTER — Ambulatory Visit (HOSPITAL_BASED_OUTPATIENT_CLINIC_OR_DEPARTMENT_OTHER): Payer: Medicare HMO | Admitting: Certified Registered Nurse Anesthetist

## 2023-09-27 ENCOUNTER — Ambulatory Visit (HOSPITAL_COMMUNITY): Payer: Medicare HMO | Admitting: Certified Registered Nurse Anesthetist

## 2023-09-27 ENCOUNTER — Encounter (HOSPITAL_COMMUNITY)
Admission: RE | Disposition: A | Discharge status: Home / Self Care | Payer: Self-pay | Source: Ambulatory Visit | Attending: Orthopedic Surgery

## 2023-09-27 ENCOUNTER — Observation Stay (HOSPITAL_COMMUNITY): Payer: Medicare HMO

## 2023-09-27 ENCOUNTER — Encounter (HOSPITAL_COMMUNITY): Payer: Self-pay | Admitting: Orthopedic Surgery

## 2023-09-27 DIAGNOSIS — Z96653 Presence of artificial knee joint, bilateral: Secondary | ICD-10-CM | POA: Insufficient documentation

## 2023-09-27 DIAGNOSIS — Z96642 Presence of left artificial hip joint: Secondary | ICD-10-CM | POA: Insufficient documentation

## 2023-09-27 DIAGNOSIS — M169 Osteoarthritis of hip, unspecified: Principal | ICD-10-CM | POA: Diagnosis present

## 2023-09-27 DIAGNOSIS — Z79899 Other long term (current) drug therapy: Secondary | ICD-10-CM | POA: Diagnosis not present

## 2023-09-27 DIAGNOSIS — I129 Hypertensive chronic kidney disease with stage 1 through stage 4 chronic kidney disease, or unspecified chronic kidney disease: Secondary | ICD-10-CM | POA: Diagnosis not present

## 2023-09-27 DIAGNOSIS — E1122 Type 2 diabetes mellitus with diabetic chronic kidney disease: Secondary | ICD-10-CM | POA: Insufficient documentation

## 2023-09-27 DIAGNOSIS — Z87891 Personal history of nicotine dependence: Secondary | ICD-10-CM | POA: Diagnosis not present

## 2023-09-27 DIAGNOSIS — J45909 Unspecified asthma, uncomplicated: Secondary | ICD-10-CM | POA: Diagnosis not present

## 2023-09-27 DIAGNOSIS — N1831 Chronic kidney disease, stage 3a: Secondary | ICD-10-CM | POA: Diagnosis not present

## 2023-09-27 DIAGNOSIS — M1611 Unilateral primary osteoarthritis, right hip: Principal | ICD-10-CM | POA: Diagnosis present

## 2023-09-27 DIAGNOSIS — Z01818 Encounter for other preprocedural examination: Secondary | ICD-10-CM

## 2023-09-27 DIAGNOSIS — Z96643 Presence of artificial hip joint, bilateral: Secondary | ICD-10-CM | POA: Diagnosis not present

## 2023-09-27 DIAGNOSIS — E139 Other specified diabetes mellitus without complications: Secondary | ICD-10-CM

## 2023-09-27 DIAGNOSIS — Z471 Aftercare following joint replacement surgery: Secondary | ICD-10-CM | POA: Diagnosis not present

## 2023-09-27 HISTORY — PX: TOTAL HIP ARTHROPLASTY: SHX124

## 2023-09-27 LAB — TYPE AND SCREEN
ABO/RH(D): O POS
Antibody Screen: NEGATIVE

## 2023-09-27 LAB — GLUCOSE, CAPILLARY
Glucose-Capillary: 105 mg/dL — ABNORMAL HIGH (ref 70–99)
Glucose-Capillary: 171 mg/dL — ABNORMAL HIGH (ref 70–99)
Glucose-Capillary: 230 mg/dL — ABNORMAL HIGH (ref 70–99)
Glucose-Capillary: 133 mg/dL — ABNORMAL HIGH (ref 70–99)

## 2023-09-27 SURGERY — ARTHROPLASTY, HIP, TOTAL, ANTERIOR APPROACH
Anesthesia: General | Site: Hip | Laterality: Right

## 2023-09-27 MED ORDER — OXYCODONE HCL 5 MG PO TABS: 5.0000 mg | ORAL_TABLET | Freq: Once | ORAL | Status: DC | PRN

## 2023-09-27 MED ORDER — BUSPIRONE HCL 5 MG PO TABS: 5.0000 mg | ORAL_TABLET | Freq: Three times a day (TID) | ORAL | Status: DC | PRN

## 2023-09-27 MED ORDER — AMISULPRIDE (ANTIEMETIC) 5 MG/2ML IV SOLN: 10.0000 mg | Freq: Once | INTRAVENOUS | Status: DC | PRN

## 2023-09-27 MED ORDER — LIDOCAINE HCL (PF) 2 % IJ SOLN
INTRAMUSCULAR | Status: AC
  Filled 2023-09-27: qty 5

## 2023-09-27 MED ORDER — SODIUM CHLORIDE 0.9 % IV SOLN: 12.5000 mg | INTRAVENOUS | Status: DC | PRN

## 2023-09-27 MED ORDER — ONDANSETRON HCL 4 MG/2ML IJ SOLN
INTRAMUSCULAR | Status: AC
  Filled 2023-09-27: qty 2

## 2023-09-27 MED ORDER — PROPOFOL 10 MG/ML IV BOLUS
INTRAVENOUS | Status: AC
  Filled 2023-09-27: qty 20

## 2023-09-27 MED ORDER — INSULIN ASPART 100 UNIT/ML IJ SOLN: 0.0000 [IU] | INTRAMUSCULAR | Status: DC | PRN

## 2023-09-27 MED ORDER — BUTALBITAL-APAP-CAFFEINE 50-325-40 MG PO TABS: 1.0000 | ORAL_TABLET | Freq: Four times a day (QID) | ORAL | Status: DC | PRN

## 2023-09-27 MED ORDER — METOCLOPRAMIDE HCL 5 MG/ML IJ SOLN: 5.0000 mg | Freq: Three times a day (TID) | INTRAMUSCULAR | Status: DC | PRN

## 2023-09-27 MED ORDER — DAPAGLIFLOZIN PROPANEDIOL 5 MG PO TABS
5.0000 mg | ORAL_TABLET | Freq: Every day | ORAL | Status: DC
Start: 2023-09-28 — End: 2023-09-28
  Administered 2023-09-28: 5 mg via ORAL
  Filled 2023-09-27: qty 1

## 2023-09-27 MED ORDER — HYDROMORPHONE HCL 1 MG/ML IJ SOLN
0.5000 mg | INTRAMUSCULAR | Status: DC | PRN
  Administered 2023-09-27 – 2023-09-28 (×2): 1 mg via INTRAVENOUS
  Filled 2023-09-27 (×2): qty 1

## 2023-09-27 MED ORDER — HYDROMORPHONE HCL 1 MG/ML IJ SOLN
0.2500 mg | INTRAMUSCULAR | Status: DC | PRN
  Administered 2023-09-27: 0.25 mg via INTRAVENOUS
  Administered 2023-09-27: 0.5 mg via INTRAVENOUS
  Administered 2023-09-27: 0.25 mg via INTRAVENOUS
  Administered 2023-09-27: 0.5 mg via INTRAVENOUS

## 2023-09-27 MED ORDER — LIDOCAINE 2% (20 MG/ML) 5 ML SYRINGE
INTRAMUSCULAR | Status: DC | PRN
  Administered 2023-09-27: 60 mg via INTRAVENOUS

## 2023-09-27 MED ORDER — METHOCARBAMOL 500 MG PO TABS
500.0000 mg | ORAL_TABLET | Freq: Four times a day (QID) | ORAL | Status: DC | PRN
  Administered 2023-09-27 – 2023-09-28 (×3): 500 mg via ORAL
  Filled 2023-09-27 (×2): qty 1

## 2023-09-27 MED ORDER — METOCLOPRAMIDE HCL 5 MG PO TABS: 5.0000 mg | ORAL_TABLET | Freq: Three times a day (TID) | ORAL | Status: DC | PRN

## 2023-09-27 MED ORDER — FLUTICASONE FUROATE-VILANTEROL 100-25 MCG/ACT IN AEPB
1.0000 | INHALATION_SPRAY | Freq: Every day | RESPIRATORY_TRACT | Status: DC
  Administered 2023-09-28: 1 via RESPIRATORY_TRACT
  Filled 2023-09-27: qty 28

## 2023-09-27 MED ORDER — FENTANYL CITRATE (PF) 100 MCG/2ML IJ SOLN
INTRAMUSCULAR | Status: DC | PRN
  Administered 2023-09-27 (×4): 25 ug via INTRAVENOUS

## 2023-09-27 MED ORDER — POVIDONE-IODINE 10 % EX SWAB: 2.0000 | Freq: Once | CUTANEOUS | Status: DC

## 2023-09-27 MED ORDER — EPHEDRINE SULFATE-NACL 50-0.9 MG/10ML-% IV SOSY
PREFILLED_SYRINGE | INTRAVENOUS | Status: DC | PRN
  Administered 2023-09-27: 10 mg via INTRAVENOUS

## 2023-09-27 MED ORDER — POLYETHYLENE GLYCOL 3350 17 G PO PACK: 17.0000 g | PACK | Freq: Every day | ORAL | Status: DC | PRN

## 2023-09-27 MED ORDER — PHENYLEPHRINE HCL-NACL 20-0.9 MG/250ML-% IV SOLN
INTRAVENOUS | Status: AC
  Filled 2023-09-27: qty 500

## 2023-09-27 MED ORDER — SODIUM CHLORIDE 0.9 % IV SOLN: INTRAVENOUS | Status: DC

## 2023-09-27 MED ORDER — DEXAMETHASONE SODIUM PHOSPHATE 10 MG/ML IJ SOLN
INTRAMUSCULAR | Status: DC | PRN
  Administered 2023-09-27: 8 mg via INTRAVENOUS

## 2023-09-27 MED ORDER — OXYCODONE-ACETAMINOPHEN 10-325 MG PO TABS: 1.0000 | ORAL_TABLET | Freq: Four times a day (QID) | ORAL | Status: DC | PRN

## 2023-09-27 MED ORDER — BISACODYL 10 MG RE SUPP: 10.0000 mg | Freq: Every day | RECTAL | Status: DC | PRN

## 2023-09-27 MED ORDER — LOSARTAN POTASSIUM 25 MG PO TABS: 25.0000 mg | ORAL_TABLET | Freq: Every evening | ORAL | Status: DC

## 2023-09-27 MED ORDER — ACETAMINOPHEN 10 MG/ML IV SOLN
1000.0000 mg | Freq: Four times a day (QID) | INTRAVENOUS | Status: DC
  Administered 2023-09-27: 1000 mg via INTRAVENOUS
  Filled 2023-09-27: qty 100

## 2023-09-27 MED ORDER — WATER FOR IRRIGATION, STERILE IR SOLN
Status: DC | PRN
  Administered 2023-09-27: 1000 mL

## 2023-09-27 MED ORDER — DEXAMETHASONE SODIUM PHOSPHATE 10 MG/ML IJ SOLN: 8.0000 mg | Freq: Once | INTRAMUSCULAR | Status: DC

## 2023-09-27 MED ORDER — HYDROMORPHONE HCL 1 MG/ML IJ SOLN
INTRAMUSCULAR | Status: AC
  Filled 2023-09-27: qty 1

## 2023-09-27 MED ORDER — PHENOL 1.4 % MT LIQD: 1.0000 | OROMUCOSAL | Status: DC | PRN

## 2023-09-27 MED ORDER — CHLORHEXIDINE GLUCONATE 0.12 % MT SOLN
15.0000 mL | Freq: Once | OROMUCOSAL | Status: AC
  Administered 2023-09-27: 15 mL via OROMUCOSAL

## 2023-09-27 MED ORDER — METHOCARBAMOL 1000 MG/10ML IJ SOLN: 500.0000 mg | Freq: Four times a day (QID) | INTRAMUSCULAR | Status: DC | PRN

## 2023-09-27 MED ORDER — CITALOPRAM HYDROBROMIDE 20 MG PO TABS
40.0000 mg | ORAL_TABLET | Freq: Every evening | ORAL | Status: DC
  Administered 2023-09-27: 40 mg via ORAL
  Filled 2023-09-27: qty 2

## 2023-09-27 MED ORDER — TOPIRAMATE 25 MG PO TABS
25.0000 mg | ORAL_TABLET | Freq: Every day | ORAL | Status: DC
  Administered 2023-09-27: 25 mg via ORAL
  Filled 2023-09-27: qty 1

## 2023-09-27 MED ORDER — LACTATED RINGERS IV SOLN
INTRAVENOUS | Status: DC
Start: 2023-09-27 — End: 2023-09-27

## 2023-09-27 MED ORDER — MECLIZINE HCL 25 MG PO TABS
25.0000 mg | ORAL_TABLET | Freq: Three times a day (TID) | ORAL | Status: DC | PRN
Start: 2023-09-27 — End: 2023-09-28

## 2023-09-27 MED ORDER — 0.9 % SODIUM CHLORIDE (POUR BTL) OPTIME
TOPICAL | Status: DC | PRN
  Administered 2023-09-27: 1000 mL

## 2023-09-27 MED ORDER — HYDROMORPHONE HCL 2 MG PO TABS
2.0000 mg | ORAL_TABLET | Freq: Four times a day (QID) | ORAL | Status: DC | PRN
  Administered 2023-09-27: 2 mg via ORAL
  Administered 2023-09-28: 4 mg via ORAL
  Filled 2023-09-27: qty 2
  Filled 2023-09-27: qty 1

## 2023-09-27 MED ORDER — HYDROMORPHONE HCL 1 MG/ML IJ SOLN
INTRAMUSCULAR | Status: DC | PRN
  Administered 2023-09-27 (×2): .2 mg via INTRAVENOUS
  Administered 2023-09-27: .4 mg via INTRAVENOUS

## 2023-09-27 MED ORDER — ONDANSETRON HCL 4 MG/2ML IJ SOLN
INTRAMUSCULAR | Status: DC | PRN
  Administered 2023-09-27: 4 mg via INTRAVENOUS

## 2023-09-27 MED ORDER — PANTOPRAZOLE SODIUM 40 MG PO TBEC
80.0000 mg | DELAYED_RELEASE_TABLET | Freq: Every day | ORAL | Status: DC
  Administered 2023-09-27: 80 mg via ORAL
  Filled 2023-09-27: qty 2

## 2023-09-27 MED ORDER — ONDANSETRON HCL 4 MG PO TABS
4.0000 mg | ORAL_TABLET | Freq: Four times a day (QID) | ORAL | Status: DC | PRN
Start: 2023-09-27 — End: 2023-09-28

## 2023-09-27 MED ORDER — INSULIN ASPART 100 UNIT/ML IJ SOLN
0.0000 [IU] | Freq: Three times a day (TID) | INTRAMUSCULAR | Status: DC
Start: 2023-09-27 — End: 2023-09-28
  Administered 2023-09-27: 5 [IU] via SUBCUTANEOUS
  Administered 2023-09-28: 3 [IU] via SUBCUTANEOUS

## 2023-09-27 MED ORDER — FENTANYL CITRATE (PF) 100 MCG/2ML IJ SOLN
INTRAMUSCULAR | Status: AC
  Filled 2023-09-27: qty 2

## 2023-09-27 MED ORDER — ROSUVASTATIN CALCIUM 20 MG PO TABS: 40.0000 mg | ORAL_TABLET | Freq: Every day | ORAL | Status: DC

## 2023-09-27 MED ORDER — MAGNESIUM CITRATE PO SOLN: 1.0000 | Freq: Once | ORAL | Status: DC | PRN

## 2023-09-27 MED ORDER — CEFAZOLIN SODIUM-DEXTROSE 2-4 GM/100ML-% IV SOLN
2.0000 g | INTRAVENOUS | Status: AC
  Administered 2023-09-27: 2 g via INTRAVENOUS
  Filled 2023-09-27: qty 100

## 2023-09-27 MED ORDER — METHOCARBAMOL 500 MG PO TABS
ORAL_TABLET | ORAL | Status: AC
  Filled 2023-09-27: qty 1

## 2023-09-27 MED ORDER — OXYCODONE-ACETAMINOPHEN 5-325 MG PO TABS
1.0000 | ORAL_TABLET | Freq: Four times a day (QID) | ORAL | Status: DC | PRN
  Administered 2023-09-27: 1 via ORAL
  Filled 2023-09-27: qty 1

## 2023-09-27 MED ORDER — TRANEXAMIC ACID-NACL 1000-0.7 MG/100ML-% IV SOLN
1000.0000 mg | INTRAVENOUS | Status: AC
  Administered 2023-09-27: 1000 mg via INTRAVENOUS
  Filled 2023-09-27: qty 100

## 2023-09-27 MED ORDER — INSULIN ASPART 100 UNIT/ML IJ SOLN: 0.0000 [IU] | Freq: Every day | INTRAMUSCULAR | Status: DC

## 2023-09-27 MED ORDER — DOCUSATE SODIUM 100 MG PO CAPS
100.0000 mg | ORAL_CAPSULE | Freq: Two times a day (BID) | ORAL | Status: DC
  Administered 2023-09-27 – 2023-09-28 (×3): 100 mg via ORAL
  Filled 2023-09-27 (×3): qty 1

## 2023-09-27 MED ORDER — ONDANSETRON HCL 4 MG/2ML IJ SOLN: 4.0000 mg | Freq: Four times a day (QID) | INTRAMUSCULAR | Status: DC | PRN

## 2023-09-27 MED ORDER — ACETAMINOPHEN 325 MG PO TABS
325.0000 mg | ORAL_TABLET | Freq: Four times a day (QID) | ORAL | Status: DC | PRN
Start: 2023-09-27 — End: 2023-09-28

## 2023-09-27 MED ORDER — ALBUTEROL SULFATE (2.5 MG/3ML) 0.083% IN NEBU
3.0000 mL | INHALATION_SOLUTION | Freq: Four times a day (QID) | RESPIRATORY_TRACT | Status: DC | PRN
Start: 2023-09-27 — End: 2023-09-28

## 2023-09-27 MED ORDER — LACTATED RINGERS IV SOLN: INTRAVENOUS | Status: DC

## 2023-09-27 MED ORDER — PROPOFOL 10 MG/ML IV BOLUS
INTRAVENOUS | Status: DC | PRN
  Administered 2023-09-27: 150 mg via INTRAVENOUS

## 2023-09-27 MED ORDER — OXYCODONE HCL 5 MG/5ML PO SOLN: 5.0000 mg | Freq: Once | ORAL | Status: DC | PRN

## 2023-09-27 MED ORDER — BUPIVACAINE-EPINEPHRINE (PF) 0.25% -1:200000 IJ SOLN
INTRAMUSCULAR | Status: DC | PRN
  Administered 2023-09-27: 50 mL

## 2023-09-27 MED ORDER — OXYCODONE HCL 5 MG PO TABS
5.0000 mg | ORAL_TABLET | Freq: Four times a day (QID) | ORAL | Status: DC | PRN
  Administered 2023-09-27: 5 mg via ORAL
  Filled 2023-09-27: qty 1

## 2023-09-27 MED ORDER — HYDROMORPHONE HCL 2 MG/ML IJ SOLN
INTRAMUSCULAR | Status: AC
  Filled 2023-09-27: qty 1

## 2023-09-27 MED ORDER — CEFAZOLIN SODIUM-DEXTROSE 2-4 GM/100ML-% IV SOLN
2.0000 g | Freq: Four times a day (QID) | INTRAVENOUS | Status: AC
  Administered 2023-09-27 (×2): 2 g via INTRAVENOUS
  Filled 2023-09-27 (×2): qty 100

## 2023-09-27 MED ORDER — MENTHOL 3 MG MT LOZG: 1.0000 | LOZENGE | OROMUCOSAL | Status: DC | PRN

## 2023-09-27 MED ORDER — BUPIVACAINE-EPINEPHRINE 0.25% -1:200000 IJ SOLN
INTRAMUSCULAR | Status: AC
  Filled 2023-09-27: qty 1

## 2023-09-27 MED ORDER — ORAL CARE MOUTH RINSE: 15.0000 mL | Freq: Once | OROMUCOSAL | Status: AC

## 2023-09-27 MED ORDER — UMECLIDINIUM BROMIDE 62.5 MCG/ACT IN AEPB
1.0000 | INHALATION_SPRAY | Freq: Every day | RESPIRATORY_TRACT | Status: DC
Start: 2023-09-28 — End: 2023-09-28
  Administered 2023-09-28: 1 via RESPIRATORY_TRACT
  Filled 2023-09-27: qty 7

## 2023-09-27 MED ORDER — ASPIRIN 81 MG PO CHEW
81.0000 mg | CHEWABLE_TABLET | Freq: Two times a day (BID) | ORAL | Status: DC
  Administered 2023-09-28: 81 mg via ORAL
  Filled 2023-09-27: qty 1

## 2023-09-27 MED ORDER — BUDESON-GLYCOPYRROL-FORMOTEROL 160-9-4.8 MCG/ACT IN AERO: 2.0000 | INHALATION_SPRAY | Freq: Every day | RESPIRATORY_TRACT | Status: DC

## 2023-09-27 MED ORDER — PROPRANOLOL HCL 20 MG PO TABS
20.0000 mg | ORAL_TABLET | Freq: Three times a day (TID) | ORAL | Status: DC
  Administered 2023-09-27 – 2023-09-28 (×3): 20 mg via ORAL
  Filled 2023-09-27 (×3): qty 1

## 2023-09-27 SURGICAL SUPPLY — 38 items
BAG COUNTER SPONGE SURGICOUNT (BAG) IMPLANT
BAG ZIPLOCK 12X15 (MISCELLANEOUS) IMPLANT
BLADE SAG 18X100X1.27 (BLADE) ×1 IMPLANT
COVER PERINEAL POST (MISCELLANEOUS) ×1 IMPLANT
COVER SURGICAL LIGHT HANDLE (MISCELLANEOUS) ×1 IMPLANT
CUP ACETBLR 48 OD SECTOR II (Hips) IMPLANT
DERMABOND ADVANCED .7 DNX12 (GAUZE/BANDAGES/DRESSINGS) ×1 IMPLANT
DRAPE FOOT SWITCH (DRAPES) ×1 IMPLANT
DRAPE STERI IOBAN 125X83 (DRAPES) ×1 IMPLANT
DRAPE U-SHAPE 47X51 STRL (DRAPES) ×2 IMPLANT
DRESSING AQUACEL AG SP 3.5X10 (GAUZE/BANDAGES/DRESSINGS) IMPLANT
DRSG AQUACEL AG ADV 3.5X10 (GAUZE/BANDAGES/DRESSINGS) ×1 IMPLANT
DRSG AQUACEL AG SP 3.5X10 (GAUZE/BANDAGES/DRESSINGS) ×1
DURAPREP 26ML APPLICATOR (WOUND CARE) ×1 IMPLANT
ELECT REM PT RETURN 15FT ADLT (MISCELLANEOUS) ×1 IMPLANT
GLOVE BIO SURGEON STRL SZ 6.5 (GLOVE) IMPLANT
GLOVE BIO SURGEON STRL SZ7 (GLOVE) IMPLANT
GLOVE BIO SURGEON STRL SZ8 (GLOVE) ×1 IMPLANT
GLOVE BIOGEL PI IND STRL 7.0 (GLOVE) IMPLANT
GLOVE BIOGEL PI IND STRL 8 (GLOVE) ×1 IMPLANT
GOWN STRL REUS W/ TWL LRG LVL3 (GOWN DISPOSABLE) ×2 IMPLANT
HEAD FEM STD 28X+1.5 STRL (Hips) IMPLANT
HOLDER FOLEY CATH W/STRAP (MISCELLANEOUS) ×1 IMPLANT
KIT TURNOVER KIT A (KITS) IMPLANT
LINER MARATHON 28 48 (Hips) IMPLANT
MANIFOLD NEPTUNE II (INSTRUMENTS) ×1 IMPLANT
PACK ANTERIOR HIP CUSTOM (KITS) ×1 IMPLANT
PENCIL SMOKE EVACUATOR COATED (MISCELLANEOUS) ×1 IMPLANT
SPIKE FLUID TRANSFER (MISCELLANEOUS) ×1 IMPLANT
STEM FEMORAL SZ 5MM STD ACTIS (Stem) IMPLANT
SUT ETHIBOND NAB CT1 #1 30IN (SUTURE) ×1 IMPLANT
SUT MNCRL AB 4-0 PS2 18 (SUTURE) ×1 IMPLANT
SUT STRATAFIX 0 PDS 27 VIOLET (SUTURE) ×1
SUT VIC AB 2-0 CT1 TAPERPNT 27 (SUTURE) ×2 IMPLANT
SUTURE STRATFX 0 PDS 27 VIOLET (SUTURE) ×1 IMPLANT
TOWEL GREEN STERILE FF (TOWEL DISPOSABLE) ×1 IMPLANT
TRAY FOLEY MTR SLVR 16FR STAT (SET/KITS/TRAYS/PACK) ×1 IMPLANT
TUBE SUCTION HIGH CAP CLEAR NV (SUCTIONS) ×1 IMPLANT

## 2023-09-27 NOTE — Anesthesia Procedure Notes (Signed)
 Procedure Name: LMA Insertion Date/Time: 09/27/2023 8:22 AM  Performed by: Casimir Lucie HERO, CRNAPre-anesthesia Checklist: Patient identified, Emergency Drugs available, Suction available and Patient being monitored Patient Re-evaluated:Patient Re-evaluated prior to induction Oxygen Delivery Method: Circle system utilized Preoxygenation: Pre-oxygenation with 100% oxygen Induction Type: IV induction Ventilation: Mask ventilation without difficulty LMA: LMA with gastric port inserted LMA Size: 4.0 Number of attempts: 1 Airway Equipment and Method: Bite block Placement Confirmation: positive ETCO2 Tube secured with: Tape Dental Injury: Teeth and Oropharynx as per pre-operative assessment

## 2023-09-27 NOTE — Op Note (Signed)
 OPERATIVE REPORT- TOTAL HIP ARTHROPLASTY   PREOPERATIVE DIAGNOSIS: Osteoarthritis of the Right hip.   POSTOPERATIVE DIAGNOSIS: Osteoarthritis of the Right  hip.   PROCEDURE: Right total hip arthroplasty, anterior approach.   SURGEON: Dempsey Moan, MD   ASSISTANT: Zelda Kobs, PA-C  ANESTHESIA:  General  ESTIMATED BLOOD LOSS:-250 mL    DRAINS: None  COMPLICATIONS: None   CONDITION: PACU - hemodynamically stable.   BRIEF CLINICAL NOTE: Lori Potts is a 78 y.o. female who has advanced end-  stage arthritis of their Right  hip with progressively worsening pain and  dysfunction.The patient has failed nonoperative management and presents for  total hip arthroplasty.   PROCEDURE IN DETAIL: After successful administration of spinal  anesthetic, the traction boots for the [facility] bed were placed on both  feet and the patient was placed onto the [facility] bed, boots placed into the leg  holders. The Right hip was then isolated from the perineum with plastic  drapes and prepped and draped in the usual sterile fashion. ASIS and  greater trochanter were marked and a oblique incision was made, starting  at about 1 cm lateral and 2 cm distal to the ASIS and coursing towards  the anterior cortex of the femur. The skin was cut with a 10 blade  through subcutaneous tissue to the level of the fascia overlying the  tensor fascia lata muscle. The fascia was then incised in line with the  incision at the junction of the anterior third and posterior 2/3rd. The  muscle was teased off the fascia and then the interval between the TFL  and the rectus was developed. The Hohmann retractor was then placed at  the top of the femoral neck over the capsule. The vessels overlying the  capsule were cauterized and the fat on top of the capsule was removed.  A Hohmann retractor was then placed anterior underneath the rectus  femoris to give exposure to the entire anterior capsule. A T-shaped   capsulotomy was performed. The edges were tagged and the femoral head  was identified.       Osteophytes are removed off the superior acetabulum.  The femoral neck was then cut in situ with an oscillating saw. Traction  was then applied to the left lower extremity utilizing the [facility]  traction. The femoral head was then removed. Retractors were placed  around the acetabulum and then circumferential removal of the labrum was  performed. Osteophytes were also removed. Reaming starts at 45 mm to  medialize and  Increased in 2 mm increments to 47 mm. We reamed in  approximately 40 degrees of abduction, 20 degrees anteversion. A 48 mm  pinnacle acetabular shell was then impacted in anatomic position under  fluoroscopic guidance with excellent purchase. We did not need to place  any additional dome screws. A 28 mm neutral + 4 marathon liner was then  placed into the acetabular shell.       The femoral lift was then placed along the lateral aspect of the femur  just distal to the vastus ridge. The leg was  externally rotated and capsule  was stripped off the inferior aspect of the femoral neck down to the  level of the lesser trochanter, this was done with electrocautery. The femur was lifted after this was performed. The  leg was then placed in an extended and adducted position essentially delivering the femur. We also removed the capsule superiorly and the piriformis from the piriformis fossa to  gain excellent exposure of the  proximal femur. Rongeur was used to remove some cancellous bone to get  into the lateral portion of the proximal femur for placement of the  initial starter reamer. The starter broaches was placed  the starter broach  and was shown to go down the center of the canal. Broaching  with the Actis system was then performed starting at size 0  coursing  Up to size 5. A size 5 had excellent torsional and rotational  and axial stability. The trial standard offset neck was then  placed  with a 28 + 1.5 trial head. The hip was then reduced. We confirmed that  the stem was in the canal both on AP and lateral x-rays. It also has excellent sizing. The hip was reduced with outstanding stability through full extension and full external rotation.. AP pelvis was taken and the leg lengths were measured and found to be equal. Hip was then dislocated again and the femoral head and neck removed. The  femoral broach was removed. Size 5 Actis stem with a standard offset  neck was then impacted into the femur following native anteversion. Has  excellent purchase in the canal. Excellent torsional and rotational and  axial stability. It is confirmed to be in the canal on AP and lateral  fluoroscopic views. The 28 + 1.5 metal head was placed and the hip  reduced with outstanding stability. Again AP pelvis was taken and it  confirmed that the leg lengths were equal. The wound was then copiously  irrigated with saline solution and the capsule reattached and repaired  with Ethibond suture. 30 ml of .25% Bupivicaine was  injected into the capsule and into the edge of the tensor fascia lata as well as subcutaneous tissue. The fascia overlying the tensor fascia lata was then closed with a running #1 V-Loc. Subcu was closed with interrupted 2-0 Vicryl and subcuticular running 4-0 Monocryl. Incision was cleaned  and dried. Steri-Strips and a bulky sterile dressing applied. The patient was awakened and transported to  recovery in stable condition.        Please note that a surgical assistant was a medical necessity for this procedure to perform it in a safe and expeditious manner. Assistant was necessary to provide appropriate retraction of vital neurovascular structures and to prevent femoral fracture and allow for anatomic placement of the prosthesis.  Dempsey Moan, M.D.

## 2023-09-27 NOTE — Anesthesia Preprocedure Evaluation (Signed)
 Anesthesia Evaluation  Patient identified by MRN, date of birth, ID band Patient awake    Reviewed: Allergy & Precautions, NPO status , Patient's Chart, lab work & pertinent test results  Airway Mallampati: II  TM Distance: >3 FB Neck ROM: Full    Dental  (+) Upper Dentures, Lower Dentures   Pulmonary asthma , former smoker   Pulmonary exam normal        Cardiovascular hypertension, Pt. on medications and Pt. on home beta blockers  Rhythm:Regular Rate:Normal     Neuro/Psych  Headaches  Anxiety Depression       GI/Hepatic Neg liver ROS,GERD  Medicated,,  Endo/Other  diabetes, Type 2    Renal/GU negative Renal ROS  negative genitourinary   Musculoskeletal  (+) Arthritis , Osteoarthritis,    Abdominal Normal abdominal exam  (+)   Peds  Hematology  (+) Blood dyscrasia, anemia   Anesthesia Other Findings   Reproductive/Obstetrics                             Anesthesia Physical Anesthesia Plan  ASA: 2  Anesthesia Plan: General   Post-op Pain Management: Dilaudid  IV   Induction: Intravenous  PONV Risk Score and Plan: 3 and Ondansetron , Dexamethasone , Treatment may vary due to age or medical condition and Midazolam   Airway Management Planned: Oral ETT  Additional Equipment: None  Intra-op Plan:   Post-operative Plan: Extubation in OR  Informed Consent: I have reviewed the patients History and Physical, chart, labs and discussed the procedure including the risks, benefits and alternatives for the proposed anesthesia with the patient or authorized representative who has indicated his/her understanding and acceptance.     Dental advisory given  Plan Discussed with: CRNA  Anesthesia Plan Comments: (-PATIENT REQUESTING GA VS SPINAL. Risks/benefits of both methods discussed in detail and patient in understanding.  Lab Results      Component                Value               Date                       WBC                      5.3                 06/03/2022                HGB                      12.4                06/03/2022                HCT                      39.0                06/03/2022                MCV                      82.5                06/03/2022  PLT                      426 (H)             06/03/2022           Lab Results      Component                Value               Date                      NA                       141                 06/03/2022                K                        3.9                 06/03/2022                CO2                      26                  06/03/2022                GLUCOSE                  115 (H)             06/03/2022                BUN                      19                  06/03/2022                CREATININE               0.87                06/03/2022                CALCIUM                   9.6                 06/03/2022                EGFR                     59 (L)              04/07/2022                GFRNONAA                 >60                 06/03/2022          )        Anesthesia Quick Evaluation

## 2023-09-27 NOTE — Plan of Care (Signed)
  Problem: Education: Goal: Knowledge of General Education information will improve Description: Including pain rating scale, medication(s)/side effects and non-pharmacologic comfort measures Outcome: Progressing   Problem: Activity: Goal: Risk for activity intolerance will decrease Outcome: Progressing   Problem: Pain Managment: Goal: General experience of comfort will improve and/or be controlled Outcome: Progressing

## 2023-09-27 NOTE — Progress Notes (Signed)
 Assuming care for this patient from off-going RN. Agree w/ previously charted shift assessment/daily documentation. Will continue care until 1930.

## 2023-09-27 NOTE — Interval H&P Note (Signed)
 History and Physical Interval Note:  09/27/2023 6:29 AM  Lori Potts  has presented today for surgery, with the diagnosis of RT HIP OSTEOARTHRITIS.  The various methods of treatment have been discussed with the patient and family. After consideration of risks, benefits and other options for treatment, the patient has consented to  Procedure(s): TOTAL HIP ARTHROPLASTY ANTERIOR APPROACH (Right) as a surgical intervention.  The patient's history has been reviewed, patient examined, no change in status, stable for surgery.  I have reviewed the patient's chart and labs.  Questions were answered to the patient's satisfaction.     [name]

## 2023-09-27 NOTE — Transfer of Care (Signed)
 Immediate Anesthesia Transfer of Care Note  Patient: Ronal DEL Pangallo  Procedure(s) Performed: TOTAL HIP ARTHROPLASTY ANTERIOR APPROACH (Right: Hip)  Patient Location: PACU  Anesthesia Type:General  Level of Consciousness: drowsy and patient cooperative  Airway & Oxygen Therapy: Patient Spontanous Breathing and Patient connected to face mask oxygen  Post-op Assessment: Report given to RN and Post -op Vital signs reviewed and stable  Post vital signs: Reviewed and stable  Last Vitals:  Vitals Value Taken Time  BP 156/67 09/27/23 0945  Temp    Pulse 62 09/27/23 0947  Resp 13 09/27/23 0947  SpO2 100 % 09/27/23 0947  Vitals shown include unfiled device data.  Last Pain:  Vitals:   09/27/23 0617  TempSrc:   PainSc: 8          Complications: No notable events documented.

## 2023-09-27 NOTE — Anesthesia Postprocedure Evaluation (Signed)
 Anesthesia Post Note  Patient: Lori Potts  Procedure(s) Performed: TOTAL HIP ARTHROPLASTY ANTERIOR APPROACH (Right: Hip)     Patient location during evaluation: PACU Anesthesia Type: General Level of consciousness: awake and alert Pain management: pain level controlled Vital Signs Assessment: post-procedure vital signs reviewed and stable Respiratory status: spontaneous breathing, nonlabored ventilation and respiratory function stable Cardiovascular status: blood pressure returned to baseline and stable Postop Assessment: no apparent nausea or vomiting Anesthetic complications: no   No notable events documented.  Last Vitals:  Vitals:   09/27/23 1045 09/27/23 1100  BP: 132/66 123/68  Pulse: 62 64  Resp: 11 12  Temp:    SpO2: 97% 98%    Last Pain:  Vitals:   09/27/23 1100  TempSrc:   PainSc: Asleep                 Butler Levander Pinal

## 2023-09-27 NOTE — Evaluation (Signed)
 Physical Therapy Evaluation Patient Details Name: Lori Potts MRN: [id number] DOB: 11-01-1945 Today's Date: 09/27/2023  History of Present Illness  78 yo female presents to therapy s/p R THA, anterior approach on 09/27/2023 due to failure of conservative measures. Pt PMH includes but is not limited to: lumbar and cervical spine surgery, CKD III, OA, depression, chronic pain, DM II, HTN, anxiety, HLD, R RTC repair, L THA, and  R TKA.  Clinical Impression   Lori Potts is a 78 y.o. female POD 0 s/p R THA. Patient reports IND with mobility at baseline. Patient is now limited by functional impairments (see PT problem list below) and requires min A  for bed mobility and CGA and Cues for transfers. Patient was able to ambulate 4 feet with RW and CGA level of assist. Patient instructed in exercise to facilitate ROM and circulation to manage edema. Patient will benefit from continued skilled PT interventions to address impairments and progress towards PLOF. Acute PT will follow to progress mobility and stair training in preparation for safe discharge home with family support and HEP.       If plan is discharge home, recommend the following: A little help with walking and/or transfers;A little help with bathing/dressing/bathroom;Assistance with cooking/housework;Assist for transportation;Help with stairs or ramp for entrance   Can travel by private vehicle        Equipment Recommendations None recommended by PT  Recommendations for Other Services       Functional Status Assessment Patient has had a recent decline in their functional status and demonstrates the ability to make significant improvements in function in a reasonable and predictable amount of time.     Precautions / Restrictions Precautions Precautions: Fall Restrictions Weight Bearing Restrictions Per Provider Order: No      Mobility  Bed Mobility Overal bed mobility: Needs Assistance Bed Mobility: Supine to Sit      Supine to sit: Min assist     General bed mobility comments: min cues    Transfers Overall transfer level: Needs assistance Equipment used: Rolling walker (2 wheels) Transfers: Sit to/from Stand Sit to Stand: Contact guard assist           General transfer comment: min cues    Ambulation/Gait Ambulation/Gait assistance: Contact guard assist Gait Distance (Feet): 4 Feet Assistive device: Rolling walker (2 wheels) Gait Pattern/deviations: Step-to pattern, Ataxic, Trunk flexed Gait velocity: decreased     General Gait Details: B UE support on RW to offload R LE in stance phase with pt indicating increased pain with mobiltiy and not wanting to progress further with gait tasks other than from bed to recliner at time of eval  Stairs            Wheelchair Mobility     Tilt Bed    Modified Rankin (Stroke Patients Only)       Balance Overall balance assessment: Needs assistance Sitting-balance support: Feet supported Sitting balance-Leahy Scale: Good     Standing balance support: Bilateral upper extremity supported, During functional activity, Reliant on assistive device for balance Standing balance-Leahy Scale: Poor                               Pertinent Vitals/Pain Pain Assessment Pain Assessment: 0-10 Pain Score: 8  Pain Location: R hip and LE Pain Descriptors / Indicators: Aching, Constant, Discomfort, Grimacing, Operative site guarding Pain Intervention(s): Limited activity within patient's tolerance, Monitored during session, Premedicated before session,  Repositioned, Ice applied    Home Living Family/patient expects to be discharged to:: Private residence Living Arrangements: Spouse/significant other Available Help at Discharge: Family;Available 24 hours/day Type of Home: House Home Access: Stairs to enter Entrance Stairs-Rails: Doctor, General Practice of Steps: 7   Home Layout: One level Home Equipment: Agricultural Consultant  (2 wheels);Cane - single point;BSC/3in1;Grab bars - tub/shower;Toilet riser;Shower seat      Prior Function Prior Level of Function : Independent/Modified Independent;Driving             Mobility Comments: IND no AD for all ADLs, self care tasks, IADLs       Extremity/Trunk Assessment        Lower Extremity Assessment Lower Extremity Assessment: RLE deficits/detail RLE Deficits / Details: ankle DF/PF 5/5 RLE Sensation: WNL    Cervical / Trunk Assessment Cervical / Trunk Assessment: Back Surgery;Neck Surgery  Communication   Communication Communication: No apparent difficulties  Cognition Arousal: Alert Behavior During Therapy: WFL for tasks assessed/performed Overall Cognitive Status: Within Functional Limits for tasks assessed                                          General Comments      Exercises Total Joint Exercises Ankle Circles/Pumps: AROM, Both, 10 reps   Assessment/Plan    PT Assessment Patient needs continued PT services  PT Problem List Decreased strength;Decreased range of motion;Decreased activity tolerance;Decreased balance;Decreased mobility;Pain       PT Treatment Interventions DME instruction;Gait training;Stair training;Functional mobility training;Therapeutic activities;Therapeutic exercise;Balance training;Neuromuscular re-education;Patient/family education;Modalities    PT Goals (Current goals can be found in the Care Plan section)  Acute Rehab PT Goals Patient Stated Goal: to be able to get on an airplane and travel for Granddsons wedding 11/2024 PT Goal Formulation: With patient Potential to Achieve Goals: Good    Frequency 7X/week     Co-evaluation               AM-PAC PT 6 Clicks Mobility  Outcome Measure Help needed turning from your back to your side while in a flat bed without using bedrails?: A Little Help needed moving from lying on your back to sitting on the side of a flat bed without using  bedrails?: A Little Help needed moving to and from a bed to a chair (including a wheelchair)?: A Little Help needed standing up from a chair using your arms (e.g., wheelchair or bedside chair)?: A Little Help needed to walk in hospital room?: A Lot Help needed climbing 3-5 steps with a railing? : Total 6 Click Score: 15    End of Session Equipment Utilized During Treatment: Gait belt       PT Visit Diagnosis: Unsteadiness on feet (R26.81);Other abnormalities of gait and mobility (R26.89);Muscle weakness (generalized) (M62.81);Pain Pain - Right/Left: Right Pain - part of body: Hip;Leg    Time: 1705-1730 PT Time Calculation (min) (ACUTE ONLY): 25 min   Charges:   PT Evaluation $PT Eval Low Complexity: 1 Low PT Treatments $Therapeutic Activity: 8-22 mins PT General Charges $$ ACUTE PT VISIT: 1 Visit         [place], PT Acute Rehab   [place] VEAR Drone 09/27/2023, 7:02 PM

## 2023-09-27 NOTE — Discharge Instructions (Signed)
Ollen Gross, MD Total Joint Specialist [facility] [address], [address] [place], [place] [address] [phone]  ANTERIOR APPROACH TOTAL HIP REPLACEMENT POSTOPERATIVE DIRECTIONS     Hip Rehabilitation, Guidelines Following Surgery  The results of a hip operation are greatly improved after range of motion and muscle strengthening exercises. Follow all safety measures which are given to protect your hip. If any of these exercises cause increased pain or swelling in your joint, decrease the amount until you are comfortable again. Then slowly increase the exercises. Call your caregiver if you have problems or questions.   BLOOD CLOT PREVENTION Take an 81 mg Aspirin two times a day for three weeks following surgery. Then resume one 81 mg Aspirin once a day. You may resume your vitamins/supplements upon discharge from the hospital. Do not take any NSAIDs (Advil, Aleve, Ibuprofen, Meloxicam, etc.) until you are 3 weeks out from surgery  HOME CARE INSTRUCTIONS  Remove items at home which could result in a fall. This includes throw rugs or furniture in walking pathways.  ICE to the affected hip as frequently as 20-30 minutes an hour and then as needed for pain and swelling. Continue to use ice on the hip for pain and swelling from surgery. You may notice swelling that will progress down to the foot and ankle. This is normal after surgery. Elevate the leg when you are not up walking on it.   Continue to use the breathing machine which will help keep your temperature down.  It is common for your temperature to cycle up and down following surgery, especially at night when you are not up moving around and exerting yourself.  The breathing machine keeps your lungs expanded and your temperature down.  DIET You may resume your previous home diet once your are discharged from the hospital.  DRESSING / WOUND CARE / SHOWERING You have an adhesive waterproof bandage over the  incision. Leave this in place until your first follow-up appointment. Once you remove this you will not need to place another bandage.  You may begin showering 3 days following surgery, but do not submerge the incision under water.  ACTIVITY For the first 3-5 days, it is important to rest and keep the operative leg elevated. You should, as a general rule, rest for 50 minutes and walk/stretch for 10 minutes per hour. After 5 days, you may slowly increase activity as tolerated.  Perform the exercises you were provided twice a day for about 15-20 minutes each session. Begin these 2 days following surgery. Walk with your walker as instructed. Use the walker until you are comfortable transitioning to a cane. Walk with the cane in the opposite hand of the operative leg. You may discontinue the cane once you are comfortable and walking steadily. Avoid periods of inactivity such as sitting longer than an hour when not asleep. This helps prevent blood clots.  Do not drive a car for 6 weeks or until released by your surgeon.  Do not drive while taking narcotics.  TED HOSE STOCKINGS Wear the elastic stockings on both legs for three weeks following surgery during the day. You may remove them at night while sleeping.  WEIGHT BEARING Weight bearing as tolerated with assist device (walker, cane, etc) as directed, use it as long as suggested by your surgeon or therapist, typically at least 4-6 weeks.  POSTOPERATIVE CONSTIPATION PROTOCOL Constipation - defined medically as fewer than three stools per week and severe constipation as less than one stool per week.  less than one stool per week.  One of the most common issues patients have following surgery is constipation.  Even if you have a regular bowel pattern at home, your normal regimen is likely to be disrupted due to multiple reasons following surgery.  Combination of anesthesia, postoperative narcotics, change in appetite and fluid intake all can  affect your bowels.  In order to avoid complications following surgery, here are some recommendations in order to help you during your recovery period.  Colace (docusate) - Pick up an over-the-counter form of Colace or another stool softener and take twice a day as long as you are requiring postoperative pain medications.  Take with a full glass of water daily.  If you experience loose stools or diarrhea, hold the colace until you stool forms back up.  If your symptoms do not get better within 1 week or if they get worse, check with your doctor. Dulcolax (bisacodyl) - Pick up over-the-counter and take as directed by the product packaging as needed to assist with the movement of your bowels.  Take with a full glass of water.  Use this product as needed if not relieved by Colace only.  MiraLax (polyethylene glycol) - Pick up over-the-counter to have on hand.  MiraLax is a solution that will increase the amount of water in your bowels to assist with bowel movements.  Take as directed and can mix with a glass of water, juice, soda, coffee, or tea.  Take if you go more than two days without a movement.Do not use MiraLax more than once per day. Call your doctor if you are still constipated or irregular after using this medication for 7 days in a row.  If you continue to have problems with postoperative constipation, please contact the office for further assistance and recommendations.  If you experience "the worst abdominal pain ever" or develop nausea or vomiting, please contact the office immediatly for further recommendations for treatment.  ITCHING  If you experience itching with your medications, try taking only a single pain pill, or even half a pain pill at a time.  You can also use Benadryl over the counter for itching or also to help with sleep.   MEDICATIONS See your medication summary on the "After Visit Summary" that the nursing staff will review with you prior to discharge.  You may have some home  medications which will be placed on hold until you complete the course of blood thinner medication.  It is important for you to complete the blood thinner medication as prescribed by your surgeon.  Continue your approved medications as instructed at time of discharge.  PRECAUTIONS If you experience chest pain or shortness of breath - call 911 immediately for transfer to the hospital emergency department.  If you develop a fever greater that 101 F, purulent drainage from wound, increased redness or drainage from wound, foul odor from the wound/dressing, or calf pain - CONTACT YOUR SURGEON.                                                   FOLLOW-UP APPOINTMENTS Make sure you keep all of your appointments after your operation with your surgeon and caregivers. You should call the office at the above phone number and make an appointment for approximately two weeks after the date of your surgery or on the   date instructed by your surgeon outlined in the "After Visit Summary".  RANGE OF MOTION AND STRENGTHENING EXERCISES  These exercises are designed to help you keep full movement of your hip joint. Follow your caregiver's or physical therapist's instructions. Perform all exercises about fifteen times, three times per day or as directed. Exercise both hips, even if you have had only one joint replacement. These exercises can be done on a training (exercise) mat, on the floor, on a table or on a bed. Use whatever works the best and is most comfortable for you. Use music or television while you are exercising so that the exercises are a pleasant break in your day. This will make your life better with the exercises acting as a break in routine you can look forward to.  Lying on your back, slowly slide your foot toward your buttocks, raising your knee up off the floor. Then slowly slide your foot back down until your leg is straight again.  Lying on your back spread your legs as far apart as you can without causing  discomfort.  Lying on your side, raise your upper leg and foot straight up from the floor as far as is comfortable. Slowly lower the leg and repeat.  Lying on your back, tighten up the muscle in the front of your thigh (quadriceps muscles). You can do this by keeping your leg straight and trying to raise your heel off the floor. This helps strengthen the largest muscle supporting your knee.  Lying on your back, tighten up the muscles of your buttocks both with the legs straight and with the knee bent at a comfortable angle while keeping your heel on the floor.   POST-OPERATIVE OPIOID TAPER INSTRUCTIONS: It is important to wean off of your opioid medication as soon as possible. If you do not need pain medication after your surgery it is ok to stop day one. Opioids include: Codeine, Hydrocodone(Norco, Vicodin), Oxycodone(Percocet, oxycontin) and hydromorphone amongst others.  Long term and even short term use of opiods can cause: Increased pain response Dependence Constipation Depression Respiratory depression And more.  Withdrawal symptoms can include Flu like symptoms Nausea, vomiting And more Techniques to manage these symptoms Hydrate well Eat regular healthy meals Stay active Use relaxation techniques(deep breathing, meditating, yoga) Do Not substitute Alcohol to help with tapering If you have been on opioids for less than two weeks and do not have pain than it is ok to stop all together.  Plan to wean off of opioids This plan should start within one week post op of your joint replacement. Maintain the same interval or time between taking each dose and first decrease the dose.  Cut the total daily intake of opioids by one tablet each day Next start to increase the time between doses. The last dose that should be eliminated is the evening dose.   IF YOU ARE TRANSFERRED TO A SKILLED REHAB FACILITY If the patient is transferred to a skilled rehab facility following release from the  hospital, a list of the current medications will be sent to the facility for the patient to continue.  When discharged from the skilled rehab facility, please have the facility set up the patient's Home Health Physical Therapy prior to being released. Also, the skilled facility will be responsible for providing the patient with their medications at time of release from the facility to include their pain medication, the muscle relaxants, and their blood thinner medication. If the patient is still at the rehab facility   up appointment, the skilled rehab facility will also need to assist the patient in arranging follow up appointment in our office and any transportation needs.  MAKE SURE YOU:  Understand these instructions.  Get help right away if you are not doing well or get worse.    DENTAL ANTIBIOTICS:  In most cases prophylactic antibiotics for Dental procdeures after total joint surgery are not necessary.  Exceptions are as follows:  1. History of prior total joint infection  2. Severely immunocompromised (Organ Transplant, cancer chemotherapy, Rheumatoid biologic meds such as Humera)  3. Poorly controlled diabetes (A1C &gt; 8.0, blood glucose over 200)  If you have one of these conditions, contact your surgeon for an antibiotic prescription, prior to your dental procedure.    Pick up stool softner and laxative for home use following surgery while on pain medications. Do not submerge incision under water. Please use good hand washing techniques while changing dressing each day. May shower starting three days after surgery. Please use a clean towel to pat the incision dry following showers. Continue to use ice for pain and swelling after surgery. Do not use any lotions or creams on the incision until instructed by your surgeon.  

## 2023-09-27 NOTE — Care Plan (Signed)
 Ortho Bundle Case Management Note  Patient Details  Name: Lori Potts MRN: [id number] Date of Birth: 1945-12-31                  R THA on 09/27/23.  DCP: Home with husband.  DME: No needs. Has RW and cane.  PT: [facility]   DME Arranged:  N/A DME Agency:      Additional Comments: Please contact me with any questions of if this plan should need to change.    Lyle Pepper, CCM Case Manager, [facility]  [phone] 09/27/2023, 9:59 AM

## 2023-09-28 ENCOUNTER — Encounter (HOSPITAL_COMMUNITY): Payer: Self-pay | Admitting: Orthopedic Surgery

## 2023-09-28 DIAGNOSIS — E1122 Type 2 diabetes mellitus with diabetic chronic kidney disease: Secondary | ICD-10-CM | POA: Diagnosis not present

## 2023-09-28 DIAGNOSIS — Z87891 Personal history of nicotine dependence: Secondary | ICD-10-CM | POA: Diagnosis not present

## 2023-09-28 DIAGNOSIS — N1831 Chronic kidney disease, stage 3a: Secondary | ICD-10-CM | POA: Diagnosis not present

## 2023-09-28 DIAGNOSIS — Z96642 Presence of left artificial hip joint: Secondary | ICD-10-CM | POA: Diagnosis not present

## 2023-09-28 DIAGNOSIS — I129 Hypertensive chronic kidney disease with stage 1 through stage 4 chronic kidney disease, or unspecified chronic kidney disease: Secondary | ICD-10-CM | POA: Diagnosis not present

## 2023-09-28 DIAGNOSIS — M1611 Unilateral primary osteoarthritis, right hip: Secondary | ICD-10-CM | POA: Diagnosis not present

## 2023-09-28 DIAGNOSIS — J45909 Unspecified asthma, uncomplicated: Secondary | ICD-10-CM | POA: Diagnosis not present

## 2023-09-28 DIAGNOSIS — Z96653 Presence of artificial knee joint, bilateral: Secondary | ICD-10-CM | POA: Diagnosis not present

## 2023-09-28 DIAGNOSIS — Z79899 Other long term (current) drug therapy: Secondary | ICD-10-CM | POA: Diagnosis not present

## 2023-09-28 LAB — CBC
HCT: 32.2 % — ABNORMAL LOW (ref 36.0–46.0)
Hemoglobin: 10.3 g/dL — ABNORMAL LOW (ref 12.0–15.0)
MCH: 24.9 pg — ABNORMAL LOW (ref 26.0–34.0)
MCHC: 32 g/dL (ref 30.0–36.0)
MCV: 77.8 fL — ABNORMAL LOW (ref 80.0–100.0)
Platelets: 413 10*3/uL — ABNORMAL HIGH (ref 150–400)
RBC: 4.14 MIL/uL (ref 3.87–5.11)
RDW: 16.7 % — ABNORMAL HIGH (ref 11.5–15.5)
WBC: 13.1 10*3/uL — ABNORMAL HIGH (ref 4.0–10.5)
nRBC: 0 % (ref 0.0–0.2)

## 2023-09-28 LAB — BASIC METABOLIC PANEL
Anion gap: 10 (ref 5–15)
BUN: 23 mg/dL (ref 8–23)
CO2: 23 mmol/L (ref 22–32)
Calcium: 9.3 mg/dL (ref 8.9–10.3)
Chloride: 104 mmol/L (ref 98–111)
Creatinine, Ser: 1.04 mg/dL — ABNORMAL HIGH (ref 0.44–1.00)
GFR, Estimated: 55 mL/min — ABNORMAL LOW (ref >60)
Glucose, Bld: 158 mg/dL — ABNORMAL HIGH (ref 70–99)
Potassium: 3.6 mmol/L (ref 3.5–5.1)
Sodium: 137 mmol/L (ref 135–145)

## 2023-09-28 LAB — GLUCOSE, CAPILLARY: Glucose-Capillary: 178 mg/dL — ABNORMAL HIGH (ref 70–99)

## 2023-09-28 MED ORDER — ASPIRIN 81 MG PO CHEW: 81.0000 mg | CHEWABLE_TABLET | Freq: Two times a day (BID) | ORAL | 0 refills | Status: AC

## 2023-09-28 MED ORDER — HYDROMORPHONE HCL 2 MG PO TABS: 2.0000 mg | ORAL_TABLET | Freq: Four times a day (QID) | ORAL | 0 refills | Status: DC | PRN

## 2023-09-28 MED ORDER — ONDANSETRON HCL 4 MG PO TABS: 4.0000 mg | ORAL_TABLET | Freq: Four times a day (QID) | ORAL | 0 refills | Status: AC | PRN

## 2023-09-28 MED ORDER — METHOCARBAMOL 500 MG PO TABS: 500.0000 mg | ORAL_TABLET | Freq: Four times a day (QID) | ORAL | 0 refills | Status: AC | PRN

## 2023-09-28 NOTE — Care Management Obs Status (Signed)
 MEDICARE OBSERVATION STATUS NOTIFICATION   Patient Details  Name: Lori Potts MRN: [id number] Date of Birth: [birth year]   Medicare Observation Status Notification Given:  Yes    Levie Ream, RN 09/28/2023, 10:16 AM

## 2023-09-28 NOTE — Plan of Care (Signed)
 Patient discharging home via private vehicle with daughter. AVS and discharge instruction provided. Patient verbalizes understanding. Ara Knee, RN 09/28/23 11:45 AM

## 2023-09-28 NOTE — Progress Notes (Signed)
   Subjective: 1 Day Post-Op Procedure(s) (LRB): TOTAL HIP ARTHROPLASTY ANTERIOR APPROACH (Right) Patient reports pain as mild.   Patient seen in rounds by Dr. Melodi. Patient is well, and has had no acute complaints or problems other than pain in the right hip. No issues overnight.  We will continue therapy today.   Objective: Vital signs in last 24 hours: Temp:  [97.5 F (36.4 C)-99 F (37.2 C)] 99 F (37.2 C) (02/06 0457) Pulse Rate:  [62-72] 72 (02/06 0457) Resp:  [10-18] 16 (02/06 0457) BP: (117-164)/(61-79) 140/65 (02/06 0457) SpO2:  [88 %-100 %] 88 % (02/06 0457)  Intake/Output from previous day:  Intake/Output Summary (Last 24 hours) at 09/28/2023 0729 Last data filed at 09/28/2023 9386 Gross per 24 hour  Intake 2729.08 ml  Output 250 ml  Net 2479.08 ml     Intake/Output this shift: No intake/output data recorded.  Labs: Recent Labs    09/28/23 0332  HGB 10.3*   Recent Labs    09/28/23 0332  WBC 13.1*  RBC 4.14  HCT 32.2*  PLT 413*   Recent Labs    09/28/23 0332  NA 137  K 3.6  CL 104  CO2 23  BUN 23  CREATININE 1.04*  GLUCOSE 158*  CALCIUM  9.3   No results for input(s): LABPT, INR in the last 72 hours.  Exam: General - Patient is Alert and Oriented Extremity - Neurologically intact Neurovascular intact Sensation intact distally Dorsiflexion/Plantar flexion intact Dressing - dressing C/D/I Motor Function - intact, moving foot and toes well on exam.   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   Cataract    right eye but immature   Dyspnea    Essential hypertension, benign    takes Lisinopril -HCTZ daily   GERD (gastroesophageal reflux disease)    Headache    sinus   History of colon polyps    benign   History of migraine    History of shingles    Hyperlipidemia    takes Lipitor daily   Low back pain    Nocturia    S/P insertion of spinal cord  stimulator    Seasonal allergies    takes Singulair  daily as needed   Type II or unspecified type diabetes mellitus without mention of complication, not stated as uncontrolled    Vitamin D  deficiency    takes Vit D weekly   Weakness    numbness and tingling left arm   Wears dentures    full upper and lower    Assessment/Plan: 1 Day Post-Op Procedure(s) (LRB): TOTAL HIP ARTHROPLASTY ANTERIOR APPROACH (Right) Principal Problem:   OA (osteoarthritis) of hip Active Problems:   Primary osteoarthritis of right hip  Estimated body mass index is 29.82 kg/m as calculated from the following:   Height as of this encounter: 5' 3.5 (1.613 m).   Weight as of this encounter: 77.6 kg. Advance diet Up with therapy D/C IV fluids  DVT Prophylaxis - Aspirin  Weight bearing as tolerated. Continue therapy.  Plan is to go Home after hospital stay. Plan for discharge this afternoon after 2 sessions of PT if meeting goals HEP Follow-up in the office in 2 weeks  The PDMP database was reviewed today prior to any opioid medications being prescribed to this patient.  Roxie Mess, PA-C Orthopedic Surgery [phone] 09/28/2023, 7:29 AM

## 2023-09-28 NOTE — Progress Notes (Signed)
 Physical Therapy Treatment Patient Details Name: Lori Potts NO MRN: [id number] DOB: 1945/11/29 Today's Date: 09/28/2023   History of Present Illness 78 yo female presents to therapy s/p R THA, anterior approach on 09/27/2023 due to failure of conservative measures. Pt PMH includes but is not limited to: lumbar and cervical spine surgery, CKD III, OA, depression, chronic pain, DM II, HTN, anxiety, HLD, R RTC repair, L THA, and  R TKA.    PT Comments  The patient  practiced steps with CGA . Patient has met PT goals for Dc.     If plan is discharge home, recommend the following: A little help with walking and/or transfers;A little help with bathing/dressing/bathroom;Assistance with cooking/housework;Assist for transportation;Help with stairs or ramp for entrance   Can travel by private vehicle        Equipment Recommendations  None recommended by PT    Recommendations for Other Services       Precautions / Restrictions Precautions Precautions: Fall Restrictions Weight Bearing Restrictions Per Provider Order: No RLE Weight Bearing Per Provider Order: Weight bearing as tolerated     Mobility  Bed Mobility     General bed mobility comments: in recliner    Transfers Overall transfer level: Needs assistance Equipment used: Rolling walker (2 wheels) Transfers: Bed to chair/wheelchair/BSC Sit to Stand: Contact guard assist            Ambulation/Gait Ambulation/Gait assistance: Contact guard assist Gait Distance (Feet): 50 Feet Assistive device: Rolling walker (2 wheels) Gait Pattern/deviations: Step-to pattern Gait velocity: decreased     General Gait Details: cue for sequence   Stairs Stairs: Yes Stairs assistance: Min assist Stair Management: One rail Right, Step to pattern, Forwards, With cane Number of Stairs: 2 General stair comments: cues for sequence   Wheelchair Mobility     Tilt Bed    Modified Rankin (Stroke Patients Only)       Balance                                             Cognition Arousal: Alert Behavior During Therapy: WFL for tasks assessed/performed Overall Cognitive Status: Within Functional Limits for tasks assessed                                          Exercises  Verbally and visually reviewed HEP. Patient very familiar with HEP.    General Comments        Pertinent Vitals/Pain Pain Assessment Pain Score: 3  Pain Location: R hip and LE Pain Descriptors / Indicators: Aching, Constant, Discomfort, Grimacing, Operative site guarding Pain Intervention(s): Premedicated before session    Home Living                          Prior Function            PT Goals (current goals can now be found in the care plan section) Progress towards PT goals: Progressing toward goals    Frequency    7X/week      PT Plan      Co-evaluation              AM-PAC PT 6 Clicks Mobility   Outcome Measure  Help needed turning from your back  to your side while in a flat bed without using bedrails?: A Little Help needed moving from lying on your back to sitting on the side of a flat bed without using bedrails?: A Little Help needed moving to and from a bed to a chair (including a wheelchair)?: A Little Help needed standing up from a chair using your arms (e.g., wheelchair or bedside chair)?: A Little Help needed to walk in hospital room?: A Little Help needed climbing 3-5 steps with a railing? : A Little 6 Click Score: 15    End of Session Equipment Utilized During Treatment: Gait belt Activity Tolerance: Patient tolerated treatment well Patient left: in chair;with family/visitor present Nurse Communication: Mobility status PT Visit Diagnosis: Unsteadiness on feet (R26.81);Other abnormalities of gait and mobility (R26.89);Muscle weakness (generalized) (M62.81);Pain Pain - Right/Left: Right Pain - part of body: Hip;Leg     Time: 1041-1051 PT Time  Calculation (min) (ACUTE ONLY): 10 min  Charges:    $Gait Training: 8-22 mins PT General Charges $$ ACUTE PT VISIT: 1 Visit                     Darice Potters PT Acute Rehabilitation Services Office [phone] Weekend [phone]    Potters Darice Norris 09/28/2023, 12:50 PM

## 2023-09-28 NOTE — Plan of Care (Signed)

## 2023-09-28 NOTE — TOC Initial Note (Signed)
 Transition of Care [facility]) - Initial/Assessment Note    Patient Details  Name: Lori Potts MRN: [id number] Date of Birth: [birth year]  Transition of Care [facility]) CM/SW Contact:    Sonda Manuella Quill, RN Phone Number: 09/28/2023, 10:19 AM  Clinical Narrative:                 TOC for d/c planning; spoke w/ pt and dtr in room; pt says she lives at home w/ her spouse Lori Potts [phone]); her dtr will provide transportation; pt verified insurance/PCP; she denies SDOH risks; pt says she has cane, walker, and BSC; she does not have HH services or home oxygen; no TOC needs.  Expected Discharge Plan: Home/Self Care Barriers to Discharge: No Barriers Identified   Patient Goals and CMS Choice Patient states their goals for this hospitalization and ongoing recovery are:: home CMS Medicare.gov Compare Post Acute Care list provided to:: Patient        Expected Discharge Plan and Services   Discharge Planning Services: CM Consult Post Acute Care Choice: NA Living arrangements for the past 2 months: Single Family Home Expected Discharge Date: 09/28/23               DME Arranged: N/A DME Agency: NA       HH Arranged: NA HH Agency: NA        Prior Living Arrangements/Services Living arrangements for the past 2 months: Single Family Home Lives with:: Spouse Patient language and need for interpreter reviewed:: Yes Do you feel safe going back to the place where you live?: Yes      Need for Family Participation in Patient Care: Yes (Comment) Care giver support system in place?: Yes (comment) Current home services: DME (cane, walker, BSC) Criminal Activity/Legal Involvement Pertinent to Current Situation/Hospitalization: No - Comment as needed  Activities of Daily Living   ADL Screening (condition at time of admission) Independently performs ADLs?: Yes (appropriate for developmental age) Is the patient deaf or have difficulty hearing?: No Does the patient have  difficulty seeing, even when wearing glasses/contacts?: No Does the patient have difficulty concentrating, remembering, or making decisions?: No  Permission Sought/Granted Permission sought to share information with : Case Manager Permission granted to share information with : Yes, Verbal Permission Granted  Share Information with NAME: Case Manager     Permission granted to share info w Relationship: Jaquetta Currier (spouse) [phone]     Emotional Assessment Appearance:: Appears stated age Attitude/Demeanor/Rapport: Gracious Affect (typically observed): Accepting Orientation: : Oriented to Self, Oriented to Place, Oriented to  Time, Oriented to Situation Alcohol / Substance Use: Not Applicable Psych Involvement: No (comment)  Admission diagnosis:  Primary osteoarthritis of right hip [M16.11] Patient Active Problem List   Diagnosis Date Noted   Primary osteoarthritis of right hip 09/27/2023   S/P lumbar fusion 01/30/2023   Stage 3a chronic kidney disease (HCC) 09/15/2022   OA (osteoarthritis) of hip 06/08/2022   Primary osteoarthritis of left hip 06/08/2022   Anxiety 04/07/2022   Palpitations 04/07/2022   Mild episode of recurrent major depressive disorder (HCC) 04/07/2022   Moderate persistent asthma without complication 02/16/2022   Intractable migraine with aura without status migrainosus 04/18/2020   Body mass index (BMI) 31.0-31.9, adult 02/06/2020   Chronic left hip pain 12/26/2019   Anemia 09/22/2017   S/P knee replacement 07/28/2017   Allergic rhinitis 05/26/2017   Vitamin D  deficiency 05/19/2017   Chronic nonintractable headache 05/18/2017   Advanced care planning/counseling discussion    Calcification of  aorta (HCC) 03/02/2017   Insomnia 11/29/2016   Abdominal wall pain in right flank 03/29/2016   Diverticulosis 02/24/2016   Type 2 diabetes mellitus with microalbuminuria, without long-term current use of insulin  (HCC) 06/16/2015   Hyperlipidemia associated  with type 2 diabetes mellitus (HCC) 06/16/2015   Chronic pain 04/13/2015   Neck pain 02/26/2015   Status post lumbar surgery 02/26/2015   Abnormal ECG 01/27/2015   Essential (primary) hypertension 01/27/2015   PCP:  Gareth Mliss FALCON, [name] Pharmacy:   [facility] [address] [place] - [address] [place] [address] Phone: [phone] Fax: [phone]  [facility] - [place], [place] - [address] [place] [address] Phone: [phone] Fax: [phone]  [id number] - [place], [place] - [address] [place] [address] Phone: [phone] Fax: [phone]  [facility] - [place], [place] - [address] [place] [address] Phone: [phone] Fax: [phone]     Social Drivers of Health (SDOH) Social History: SDOH Screenings   Food Insecurity: No Food Insecurity (09/28/2023)  Housing: Low Risk  (09/28/2023)  Transportation Needs: No Transportation Needs (09/28/2023)  Utilities: Not At Risk (09/28/2023)  Alcohol Screen: Low Risk  (06/15/2023)  Depression (PHQ2-9): Low Risk  (06/29/2023)  Financial Resource Strain: Low Risk  (03/30/2023)  Physical Activity: Inactive (03/30/2023)  Social Connections: Patient Declined (09/27/2023)  Stress: No Stress Concern Present (03/30/2023)  Tobacco Use: Medium Risk (09/27/2023)  Health Literacy: Adequate Health Literacy (03/30/2023)   SDOH Interventions: Food Insecurity Interventions: Intervention Not Indicated, Inpatient TOC Housing Interventions: Intervention Not Indicated, Inpatient TOC Transportation Interventions: Intervention Not Indicated, Inpatient TOC Utilities Interventions: Intervention Not Indicated, Inpatient TOC   Readmission Risk Interventions     No data to display

## 2023-09-28 NOTE — Progress Notes (Signed)
 Physical Therapy Treatment Patient Details Name: Lori Potts MRN: [id number] DOB: [birth year] Today's Date: 09/28/2023   History of Present Illness 78 yo female presents to therapy s/p R THA, anterior approach on 09/27/2023 due to failure of conservative measures. Pt PMH includes but is not limited to: lumbar and cervical spine surgery, CKD III, OA, depression, chronic pain, DM II, HTN, anxiety, HLD, R RTC repair, L THA, and  R TKA.    PT Comments  Patient making progress. Will practice steps and will be  ready for DC.     If plan is discharge home, recommend the following: A little help with walking and/or transfers;A little help with bathing/dressing/bathroom;Assistance with cooking/housework;Assist for transportation;Help with stairs or ramp for entrance   Can travel by private vehicle        Equipment Recommendations  None recommended by PT    Recommendations for Other Services       Precautions / Restrictions Precautions Precautions: Fall Restrictions Weight Bearing Restrictions Per Provider Order: No RLE Weight Bearing Per Provider Order: Weight bearing as tolerated     Mobility  Bed Mobility Overal bed mobility: Needs Assistance Bed Mobility: Supine to Sit     Supine to sit: Mod assist     General bed mobility comments: patientin urinary urgency and was trying to hold urine so needed more assist.    Transfers Overall transfer level: Needs assistance Equipment used: Rolling walker (2 wheels) Transfers: Bed to chair/wheelchair/BSC Sit to Stand: Contact guard assist           General transfer comment: patient  having urinary urgency, transferred to [facility] then ambulated    Ambulation/Gait Ambulation/Gait assistance: Contact guard assist Gait Distance (Feet): 60 Feet Assistive device: Rolling walker (2 wheels) Gait Pattern/deviations: Step-to pattern Gait velocity: decreased     General Gait Details: cue for sequence   Stairs              Wheelchair Mobility     Tilt Bed    Modified Rankin (Stroke Patients Only)       Balance                                            Cognition Arousal: Alert Behavior During Therapy: WFL for tasks assessed/performed Overall Cognitive Status: Within Functional Limits for tasks assessed                                          Exercises      General Comments        Pertinent Vitals/Pain Pain Assessment Pain Location: R hip and LE Pain Descriptors / Indicators: Aching, Constant, Discomfort, Grimacing, Operative site guarding Pain Intervention(s): Premedicated before session    Home Living                          Prior Function            PT Goals (current goals can now be found in the care plan section) Progress towards PT goals: Progressing toward goals    Frequency    7X/week      PT Plan      Co-evaluation              AM-PAC PT 6  Clicks Mobility   Outcome Measure  Help needed turning from your back to your side while in a flat bed without using bedrails?: A Little Help needed moving from lying on your back to sitting on the side of a flat bed without using bedrails?: A Little Help needed moving to and from a bed to a chair (including a wheelchair)?: A Little Help needed standing up from a chair using your arms (e.g., wheelchair or bedside chair)?: A Little Help needed to walk in hospital room?: A Little Help needed climbing 3-5 steps with a railing? : A Little 6 Click Score: 18    End of Session Equipment Utilized During Treatment: Gait belt Activity Tolerance: Patient tolerated treatment well Patient left: in chair;with family/visitor present Nurse Communication: Mobility status PT Visit Diagnosis: Unsteadiness on feet (R26.81);Other abnormalities of gait and mobility (R26.89);Muscle weakness (generalized) (M62.81);Pain Pain - Right/Left: Right Pain - part of body: Hip;Leg      Time: 8986-8967 PT Time Calculation (min) (ACUTE ONLY): 19 min  Charges:    $Gait Training: 8-22 mins PT General Charges $$ ACUTE PT VISIT: 1 Visit                     Darice Potters PT Acute Rehabilitation Services Office [phone] Weekend [phone]    Potters Darice Norris 09/28/2023, 12:19 PM

## 2023-10-02 NOTE — Discharge Summary (Signed)
 Patient ID: Lori Potts MRN: [id number] DOB/AGE: April 25, 1946 78 y.o.  Admit date: 09/27/2023 Discharge date: 09/28/2023  Admission Diagnoses:  Principal Problem:   OA (osteoarthritis) of hip Active Problems:   Primary osteoarthritis of right hip   Discharge Diagnoses:  Same  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   Cataract    right eye but immature   Dyspnea    Essential hypertension, benign    takes Lisinopril -HCTZ daily   GERD (gastroesophageal reflux disease)    Headache    sinus   History of colon polyps    benign   History of migraine    History of shingles    Hyperlipidemia    takes Lipitor daily   Low back pain    Nocturia    S/P insertion of spinal cord stimulator    Seasonal allergies    takes Singulair  daily as needed   Type II or unspecified type diabetes mellitus without mention of complication, not stated as uncontrolled    Vitamin D  deficiency    takes Vit D weekly   Weakness    numbness and tingling left arm   Wears dentures    full upper and lower    Surgeries: Procedure(s): TOTAL HIP ARTHROPLASTY ANTERIOR APPROACH on 09/27/2023   Consultants:   Discharged Condition: Improved  Hospital Course: Lori Potts is an 78 y.o. female who was admitted 09/27/2023 for operative treatment ofOA (osteoarthritis) of hip. Patient has severe unremitting pain that affects sleep, daily activities, and work/hobbies. After pre-op clearance the patient was taken to the operating room on 09/27/2023 and underwent  Procedure(s): TOTAL HIP ARTHROPLASTY ANTERIOR APPROACH.    Patient was given perioperative antibiotics:  Anti-infectives (From admission, onward)    Start     Dose/Rate Route Frequency Ordered Stop   09/27/23 1430  ceFAZolin  (ANCEF ) IVPB 2g/100 mL premix        2 g 200 mL/hr over 30 Minutes Intravenous Every 6 hours 09/27/23 1255 09/28/23 0701   09/27/23 0600  ceFAZolin   (ANCEF ) IVPB 2g/100 mL premix        2 g 200 mL/hr over 30 Minutes Intravenous On call to O.R. 09/27/23 9444 09/27/23 9146        Patient was given sequential compression devices, early ambulation, and chemoprophylaxis to prevent DVT.  Patient benefited maximally from hospital stay and there were no complications.    Recent vital signs: No data found.   Recent laboratory studies: No results for input(s): WBC, HGB, HCT, PLT, NA, K, CL, CO2, BUN, CREATININE, GLUCOSE, INR, CALCIUM  in the last 72 hours.  Invalid input(s): PT, 2   Discharge Medications:   Allergies as of 09/28/2023       Reactions   Metformin And Related Diarrhea   Adhesive [tape] Rash, Other (See Comments)   Regular tape is ok, allergy is to paper tape        Medication List     STOP taking these medications    aspirin  EC 81 MG tablet Replaced by: aspirin  81 MG chewable tablet       TAKE these medications    albuterol  108 (90 Base) MCG/ACT inhaler Commonly known as: VENTOLIN  HFA Inhale 2 puffs into the lungs every 6 (six) hours as needed for wheezing or shortness of breath.   aspirin  81 MG chewable tablet Chew 1 tablet (81 mg total) by mouth 2 (two) times  daily for 20 days. Then resume an 81 mg aspirin  once a day Replaces: aspirin  EC 81 MG tablet   Breztri  Aerosphere 160-9-4.8 MCG/ACT Aero Generic drug: Budeson-Glycopyrrol-Formoterol  Inhale 2 puffs into the lungs daily before lunch.   busPIRone  5 MG tablet Commonly known as: BUSPAR  TAKE 1 TABLET THREE TIMES DAILY AS NEEDED   butalbital -acetaminophen -caffeine  50-325-40 MG tablet Commonly known as: FIORICET Take 1 tablet by mouth every 6 (six) hours as needed for headache.   citalopram  40 MG tablet Commonly known as: CELEXA  TAKE 1 TABLET BY MOUTH EVERY DAY What changed: when to take this   dapagliflozin  propanediol 5 MG Tabs tablet Commonly known as: FARXIGA  Take 1 tablet (5 mg total) by mouth daily before  breakfast.   DropSafe Alcohol Prep 70 % Pads USE TO TEST BLOOD SUGAR TWO TIMES DAILY   esomeprazole  40 MG capsule Commonly known as: NEXIUM  TAKE 1 CAPSULE AT BEDTIME   FreeStyle Libre 3 Sensor Misc 1 Device by Does not apply route every 14 (fourteen) days.   HYDROmorphone  2 MG tablet Commonly known as: DILAUDID  Take 1-2 tablets (2-4 mg total) by mouth every 6 (six) hours as needed for severe pain (pain score 7-10) (severe breakthrough pain not responding to chronic oxycodone ).   losartan  25 MG tablet Commonly known as: COZAAR  TAKE 1 TABLET EVERY DAY What changed: when to take this   meclizine  25 MG tablet Commonly known as: ANTIVERT  Take 1 tablet (25 mg total) by mouth 3 (three) times daily as needed for dizziness.   methocarbamol  500 MG tablet Commonly known as: ROBAXIN  Take 1 tablet (500 mg total) by mouth every 6 (six) hours as needed for muscle spasms.   ondansetron  4 MG disintegrating tablet Commonly known as: ZOFRAN -ODT Take 1 tablet (4 mg total) by mouth every 8 (eight) hours as needed for nausea or vomiting.   ondansetron  4 MG tablet Commonly known as: ZOFRAN  Take 1 tablet (4 mg total) by mouth every 6 (six) hours as needed for nausea.   oxyCODONE -acetaminophen  10-325 MG tablet Commonly known as: PERCOCET Take 1 tablet by mouth every 6 (six) hours as needed for pain.   propranolol  20 MG tablet Commonly known as: INDERAL  TAKE 1 TABLET THREE TIMES DAILY   rosuvastatin  40 MG tablet Commonly known as: CRESTOR  Take 1 tablet (40 mg total) by mouth daily. What changed: when to take this   topiramate  25 MG tablet Commonly known as: TOPAMAX  Take 25 mg by mouth at bedtime.   TRUEplus Lancets 33G Misc TEST BLOOD SUGAR TWO TIMES DAILY   Trulicity  0.75 MG/0.5ML Soaj Generic drug: Dulaglutide  Inject 0.75 mg into the skin once a week. Patient receives via [employer] Patient Assistance through Dec 2023 What changed: when to take this                Discharge Care Instructions  (From admission, onward)           Start     Ordered   09/28/23 0000  Weight bearing as tolerated        09/28/23 0731   09/28/23 0000  Change dressing       Comments: You have an adhesive waterproof bandage over the incision. Leave this in place until your first follow-up appointment. Once you remove this you will not need to place another bandage.   09/28/23 0731            Diagnostic Studies: DG Pelvis Portable Result Date: 09/27/2023 CLINICAL DATA:  Status post total right hip  arthroplasty. EXAM: PORTABLE PELVIS 1-2 VIEWS COMPARISON:  AP bilateral hips 06/08/2022 FINDINGS: Interval total right hip arthroplasty. Redemonstration of prior total left hip arthroplasty. Resolution of the prior left hip postoperative subcutaneous air. Expected right hip postoperative subcutaneous air. No acute fracture or dislocation. IMPRESSION: Interval total right hip arthroplasty without evidence of complication. Electronically Signed   By: Tanda Lyons M.D.   On: 09/27/2023 11:49   DG HIP UNILAT WITH PELVIS 1V RIGHT Result Date: 09/27/2023 CLINICAL DATA:  Elective surgery. EXAM: DG HIP (WITH OR WITHOUT PELVIS) 1V RIGHT COMPARISON:  None Available. FINDINGS: Two fluoroscopic spot views of the pelvis and right hip obtained in the operating room. Images during hip arthroplasty. Fluoroscopy time 8.3 seconds. Dose 1.0969 mGy. IMPRESSION: Intraoperative fluoroscopy during right hip arthroplasty. Electronically Signed   By: Andrea Gasman M.D.   On: 09/27/2023 09:53   DG C-Arm 1-60 Min-No Report Result Date: 09/27/2023 Fluoroscopy was utilized by the requesting physician.  No radiographic interpretation.    Disposition: Discharge disposition: 01-Home or Self Care       Discharge Instructions     Call MD / Call 911   Complete by: As directed    If you experience chest pain or shortness of breath, CALL 911 and be transported to the hospital emergency room.  If you  develope a fever above 101 F, pus (white drainage) or increased drainage or redness at the wound, or calf pain, call your surgeon's office.   Change dressing   Complete by: As directed    You have an adhesive waterproof bandage over the incision. Leave this in place until your first follow-up appointment. Once you remove this you will not need to place another bandage.   Constipation Prevention   Complete by: As directed    Drink plenty of fluids.  Prune juice may be helpful.  You may use a stool softener, such as Colace (over the counter) 100 mg twice a day.  Use MiraLax  (over the counter) for constipation as needed.   Diet - low sodium heart healthy   Complete by: As directed    Do not sit on low chairs, stoools or toilet seats, as it may be difficult to get up from low surfaces   Complete by: As directed    Driving restrictions   Complete by: As directed    No driving for two weeks   Post-operative opioid taper instructions:   Complete by: As directed    POST-OPERATIVE OPIOID TAPER INSTRUCTIONS: It is important to wean off of your opioid medication as soon as possible. If you do not need pain medication after your surgery it is ok to stop day one. Opioids include: Codeine , Hydrocodone (Norco, Vicodin), Oxycodone (Percocet, oxycontin ) and hydromorphone  amongst others.  Long term and even short term use of opiods can cause: Increased pain response Dependence Constipation Depression Respiratory depression And more.  Withdrawal symptoms can include Flu like symptoms Nausea, vomiting And more Techniques to manage these symptoms Hydrate well Eat regular healthy meals Stay active Use relaxation techniques(deep breathing, meditating, yoga) Do Not substitute Alcohol to help with tapering If you have been on opioids for less than two weeks and do not have pain than it is ok to stop all together.  Plan to wean off of opioids This plan should start within one week post op of your joint  replacement. Maintain the same interval or time between taking each dose and first decrease the dose.  Cut the total daily intake of opioids by one  tablet each day Next start to increase the time between doses. The last dose that should be eliminated is the evening dose.      TED hose   Complete by: As directed    Use stockings (TED hose) for three weeks on both leg(s).  You may remove them at night for sleeping.   Weight bearing as tolerated   Complete by: As directed         Follow-up Information     Melodi Lerner, MD. Go on 10/11/2023.   Specialty: Orthopedic Surgery Why: You are scheduled for first post op appt on Wednesday February 19 at 8:15am. Contact information: [address] [place] [address] [place] [address] [phone]                  Signed: Roxie Mess 10/02/2023, 8:09 AM

## 2023-10-24 ENCOUNTER — Other Ambulatory Visit: Payer: Self-pay | Admitting: Emergency Medicine

## 2023-10-24 DIAGNOSIS — I10 Essential (primary) hypertension: Secondary | ICD-10-CM

## 2023-10-25 MED ORDER — LOSARTAN POTASSIUM 25 MG PO TABS: 25.0000 mg | ORAL_TABLET | Freq: Every evening | ORAL | 1 refills | Status: DC

## 2023-10-31 ENCOUNTER — Other Ambulatory Visit: Payer: Self-pay

## 2023-10-31 DIAGNOSIS — N1831 Chronic kidney disease, stage 3a: Secondary | ICD-10-CM

## 2023-10-31 DIAGNOSIS — R809 Proteinuria, unspecified: Secondary | ICD-10-CM

## 2023-10-31 DIAGNOSIS — E1129 Type 2 diabetes mellitus with other diabetic kidney complication: Secondary | ICD-10-CM

## 2023-10-31 MED ORDER — DAPAGLIFLOZIN PROPANEDIOL 5 MG PO TABS: 5.0000 mg | ORAL_TABLET | Freq: Every day | ORAL | 3 refills | Status: DC

## 2023-11-02 ENCOUNTER — Telehealth: Payer: Self-pay | Admitting: Emergency Medicine

## 2023-11-02 NOTE — Telephone Encounter (Signed)
 Patient stating she has been itching since using amoxcillin. Can something be called in. No rash or redness

## 2023-11-02 NOTE — Telephone Encounter (Signed)
 Patient notified

## 2023-11-07 ENCOUNTER — Other Ambulatory Visit: Payer: Self-pay | Admitting: Nurse Practitioner

## 2023-11-07 DIAGNOSIS — I10 Essential (primary) hypertension: Secondary | ICD-10-CM

## 2023-11-07 DIAGNOSIS — E7800 Pure hypercholesterolemia, unspecified: Secondary | ICD-10-CM

## 2023-11-08 NOTE — Telephone Encounter (Signed)
 Requested medication (s) are due for refill today: yes  Requested medication (s) are on the active medication list: yes  Last refill:  04/07/23  Future visit scheduled: no  Notes to clinic:  Medication not assigned to a protocol, review manually.      Requested Prescriptions  Pending Prescriptions Disp Refills   Blood Glucose Monitoring Suppl (TRUE METRIX AIR GLUCOSE METER) w/Device KIT [Pharmacy Med Name: True Metrix Air Glucose Meter Kit w/Device] 1 kit 3    Sig: USE AS DIRECTED     Endocrinology: Diabetes - Child psychotherapist Passed - 11/08/2023  3:41 PM      Passed - Valid encounter within last 12 months    Recent Outpatient Visits           1 month ago Preoperative examination   [facility] [name], [name]   2 months ago Type 2 diabetes mellitus with microalbuminuria, without long-term current use of insulin [facility])   [facility] [name], [name]   4 months ago Upper respiratory tract infection, unspecified type   [facility] Mecum, Erin E, PA-C   4 months ago Intractable migraine with aura without status migrainosus   [facility] [name], [name]   6 months ago Intractable migraine with aura without status migrainosus   [facility] [name], [name]               Alcohol Swabs (DROPSAFE ALCOHOL PREP) 70 % PADS [Pharmacy Med Name: DropSafe Alcohol Prep Pad 70 %] 200 each 3    Sig: USE TO TEST BLOOD SUGAR TWO TIMES DAILY     Off-Protocol Failed - 11/08/2023  3:41 PM      Failed - Medication not assigned to a protocol, review manually.      Passed - Valid encounter within last 12 months    Recent Outpatient Visits           1 month ago Preoperative examination   [facility] [name], [name]   2 months ago Type 2 diabetes mellitus with microalbuminuria, without long-term current use of  insulin [facility])   [place] [facility] [name], [name]   4 months ago Upper respiratory tract infection, unspecified type   [facility] Mecum, Erin E, PA-C   4 months ago Intractable migraine with aura without status migrainosus   [facility] [name], [name]   6 months ago Intractable migraine with aura without status migrainosus   [facility] [name], [name]              Signed Prescriptions Disp Refills   rosuvastatin (CRESTOR) 40 MG tablet 90 tablet 0    Sig: Take 1 tablet (40 mg total) by mouth at bedtime.     Cardiovascular:  Antilipid - Statins 2 Failed - 11/08/2023  3:41 PM      Failed - Cr in normal range and within 360 days    Creat  Date Value Ref Range Status  09/12/2023 0.98 0.60 - 1.00 mg/dL Final   Creatinine, Ser  Date Value Ref Range Status  09/28/2023 1.04 (H) 0.44 - 1.00 mg/dL Final   Creatinine, Urine  Date Value Ref Range Status  09/12/2023 93 20 - 275 mg/dL Final         Failed - Lipid Panel in  normal range within the last 12 months    Cholesterol  Date Value Ref Range Status  09/12/2023 173 <200 mg/dL Final   LDL Cholesterol (Calc)  Date Value Ref Range Status  09/12/2023 87 mg/dL (calc) Final    Comment:    Reference range: <100 . Desirable range <100 mg/dL for primary prevention;   <70 mg/dL for patients with CHD or diabetic patients  with > or = 2 CHD risk factors. Marland Kitchen LDL-C is now calculated using the Martin-Hopkins  calculation, which is a validated novel method providing  better accuracy than the Friedewald equation in the  estimation of LDL-C.  Horald Pollen et al. Lenox Ahr. 1610;960(45): 2061-2068  (http://education.[facility].com/faq/FAQ164)    HDL  Date Value Ref Range Status  09/12/2023 71 > OR = 50 mg/dL Final   Triglycerides  Date Value Ref Range Status  09/12/2023 63 <150 mg/dL Final         Passed - Patient  is not pregnant      Passed - Valid encounter within last 12 months    Recent Outpatient Visits           1 month ago Preoperative examination   [facility] [name], [name]   2 months ago Type 2 diabetes mellitus with microalbuminuria, without long-term current use of insulin [facility])   [facility] [name], [name]   4 months ago Upper respiratory tract infection, unspecified type   [facility] Mecum, Oswaldo Conroy, PA-C   4 months ago Intractable migraine with aura without status migrainosus   [facility] [name], [name]   6 months ago Intractable migraine with aura without status migrainosus   [name], [name]               esomeprazole (NEXIUM) 40 MG capsule 90 capsule 0    Sig: TAKE 1 CAPSULE AT BEDTIME     Gastroenterology: Proton Pump Inhibitors 2 Passed - 11/08/2023  3:41 PM      Passed - ALT in normal range and within 360 days    ALT  Date Value Ref Range Status  09/12/2023 19 6 - 29 U/L Final   SGPT (ALT)  Date Value Ref Range Status  11/26/2013 26 12 - 78 U/L Final         Passed - AST in normal range and within 360 days    AST  Date Value Ref Range Status  09/12/2023 22 10 - 35 U/L Final   SGOT(AST)  Date Value Ref Range Status  11/26/2013 15 15 - 37 Unit/L Final         Passed - Valid encounter within last 12 months    Recent Outpatient Visits           1 month ago Preoperative examination   [facility] [name], [name]   2 months ago Type 2 diabetes mellitus with microalbuminuria, without long-term current use of insulin [facility])   [facility] [name], [name]   4 months ago Upper respiratory tract infection, unspecified type   [facility] Mecum, Oswaldo Conroy, PA-C   4 months ago Intractable migraine with aura  without status migrainosus   [facility] [name], [name]   6 months ago Intractable migraine with aura without status migrainosus   [facility] [name], [name]              Refused Prescriptions Disp Refills   losartan (COZAAR) 25 MG tablet [Pharmacy Med Name: Losartan Potassium Oral Tablet 25 MG] 90 tablet 3    Sig: TAKE 1 TABLET EVERY DAY     Cardiovascular:  Angiotensin Receptor Blockers Failed - 11/08/2023  3:41 PM      Failed - Cr in normal range and within 180 days    Creat  Date Value Ref Range Status  09/12/2023 0.98 0.60 - 1.00 mg/dL Final   Creatinine, Ser  Date Value Ref Range Status  09/28/2023 1.04 (H) 0.44 - 1.00 mg/dL Final   Creatinine, Urine  Date Value Ref Range Status  09/12/2023 93 20 - 275 mg/dL Final         Passed - K in normal range and within 180 days    Potassium  Date Value Ref Range Status  09/28/2023 3.6 3.5 - 5.1 mmol/L Final  11/26/2013 3.3 (L) 3.5 - 5.1 mmol/L Final         Passed - Patient is not pregnant      Passed - Last BP in normal range    BP Readings from Last 1 Encounters:  09/28/23 (!) 107/50         Passed - Valid encounter within last 6 months    Recent Outpatient Visits           1 month ago Preoperative examination   [name], [name]   2 months ago Type 2 diabetes mellitus with microalbuminuria, without long-term current use of insulin [facility])   [facility] [name], [name]   4 months ago Upper respiratory tract infection, unspecified type   [facility] Mecum, Oswaldo Conroy, PA-C   4 months ago Intractable migraine with aura without status migrainosus   [facility] [name], [name]   6 months ago Intractable migraine with aura without status migrainosus   [facility] [name], [name]

## 2023-11-08 NOTE — Telephone Encounter (Signed)
 Requested Prescriptions  Pending Prescriptions Disp Refills   Blood Glucose Monitoring Suppl (TRUE METRIX AIR GLUCOSE METER) w/Device KIT [Pharmacy Med Name: True Metrix Air Glucose Meter Kit w/Device] 1 kit 3    Sig: USE AS DIRECTED     Endocrinology: Diabetes - Child psychotherapist Passed - 11/08/2023  3:40 PM      Passed - Valid encounter within last 12 months    Recent Outpatient Visits           1 month ago Preoperative examination   [facility] [name], [name]   2 months ago Type 2 diabetes mellitus with microalbuminuria, without long-term current use of insulin [facility])   [facility] [name], [name]   4 months ago Upper respiratory tract infection, unspecified type   [facility] Mecum, Oswaldo Conroy, PA-C   4 months ago Intractable migraine with aura without status migrainosus   [facility] [name], [name]   6 months ago Intractable migraine with aura without status migrainosus   [facility] [name], [name]               rosuvastatin (CRESTOR) 40 MG tablet [Pharmacy Med Name: Rosuvastatin Calcium Oral Tablet 40 MG] 90 tablet 0    Sig: Take 1 tablet (40 mg total) by mouth at bedtime.     Cardiovascular:  Antilipid - Statins 2 Failed - 11/08/2023  3:40 PM      Failed - Cr in normal range and within 360 days    Creat  Date Value Ref Range Status  09/12/2023 0.98 0.60 - 1.00 mg/dL Final   Creatinine, Ser  Date Value Ref Range Status  09/28/2023 1.04 (H) 0.44 - 1.00 mg/dL Final   Creatinine, Urine  Date Value Ref Range Status  09/12/2023 93 20 - 275 mg/dL Final         Failed - Lipid Panel in normal range within the last 12 months    Cholesterol  Date Value Ref Range Status  09/12/2023 173 <200 mg/dL Final   LDL Cholesterol (Calc)  Date Value Ref Range Status  09/12/2023 87 mg/dL (calc) Final    Comment:    Reference  range: <100 . Desirable range <100 mg/dL for primary prevention;   <70 mg/dL for patients with CHD or diabetic patients  with > or = 2 CHD risk factors. Marland Kitchen LDL-C is now calculated using the Martin-Hopkins  calculation, which is a validated novel method providing  better accuracy than the Friedewald equation in the  estimation of LDL-C.  Horald Pollen et al. Lenox Ahr. 0981;191(47): 2061-2068  (http://education.[facility].com/faq/FAQ164)    HDL  Date Value Ref Range Status  09/12/2023 71 > OR = 50 mg/dL Final   Triglycerides  Date Value Ref Range Status  09/12/2023 63 <150 mg/dL Final         Passed - Patient is not pregnant      Passed - Valid encounter within last 12 months    Recent Outpatient Visits           1 month ago Preoperative examination   [facility] [name], [name]   2 months ago Type 2 diabetes mellitus with microalbuminuria, without long-term current use of insulin [facility])   [facility] [name], [name]   4 months ago Upper respiratory tract infection, unspecified type   [facility]  Mecum, Erin E, PA-C   4 months ago Intractable migraine with aura without status migrainosus   [facility] [name], [name]   6 months ago Intractable migraine with aura without status migrainosus   [facility] [name], [name]               Alcohol Swabs (DROPSAFE ALCOHOL PREP) 70 % PADS [Pharmacy Med Name: DropSafe Alcohol Prep Pad 70 %] 200 each 3    Sig: USE TO TEST BLOOD SUGAR TWO TIMES DAILY     Off-Protocol Failed - 11/08/2023  3:40 PM      Failed - Medication not assigned to a protocol, review manually.      Passed - Valid encounter within last 12 months    Recent Outpatient Visits           1 month ago Preoperative examination   [facility] [name], [name]   2 months ago Type 2  diabetes mellitus with microalbuminuria, without long-term current use of insulin [facility])   [facility] [name], [name]   4 months ago Upper respiratory tract infection, unspecified type   [facility] Mecum, Oswaldo Conroy, PA-C   4 months ago Intractable migraine with aura without status migrainosus   [facility] [name], [name]   6 months ago Intractable migraine with aura without status migrainosus   [facility] [name], [name]               esomeprazole (NEXIUM) 40 MG capsule [Pharmacy Med Name: Esomeprazole Magnesium Oral Capsule Delayed Release 40 MG] 90 capsule 0    Sig: TAKE 1 CAPSULE AT BEDTIME     Gastroenterology: Proton Pump Inhibitors 2 Passed - 11/08/2023  3:40 PM      Passed - ALT in normal range and within 360 days    ALT  Date Value Ref Range Status  09/12/2023 19 6 - 29 U/L Final   SGPT (ALT)  Date Value Ref Range Status  11/26/2013 26 12 - 78 U/L Final         Passed - AST in normal range and within 360 days    AST  Date Value Ref Range Status  09/12/2023 22 10 - 35 U/L Final   SGOT(AST)  Date Value Ref Range Status  11/26/2013 15 15 - 37 Unit/L Final         Passed - Valid encounter within last 12 months    Recent Outpatient Visits           1 month ago Preoperative examination   [facility] [name], [name]   2 months ago Type 2 diabetes mellitus with microalbuminuria, without long-term current use of insulin [facility])   [facility] [name], [name]   4 months ago Upper respiratory tract infection, unspecified type   [facility] Mecum, Erin E, PA-C   4 months ago Intractable migraine with aura without status migrainosus   [facility] [name], [name]   6 months ago Intractable migraine with aura without status  migrainosus   [facility] [name], [name]               losartan (COZAAR) 25 MG tablet [Pharmacy Med Name: Losartan Potassium Oral Tablet 25 MG]  90 tablet 3    Sig: TAKE 1 TABLET EVERY DAY     Cardiovascular:  Angiotensin Receptor Blockers Failed - 11/08/2023  3:40 PM      Failed - Cr in normal range and within 180 days    Creat  Date Value Ref Range Status  09/12/2023 0.98 0.60 - 1.00 mg/dL Final   Creatinine, Ser  Date Value Ref Range Status  09/28/2023 1.04 (H) 0.44 - 1.00 mg/dL Final   Creatinine, Urine  Date Value Ref Range Status  09/12/2023 93 20 - 275 mg/dL Final         Passed - K in normal range and within 180 days    Potassium  Date Value Ref Range Status  09/28/2023 3.6 3.5 - 5.1 mmol/L Final  11/26/2013 3.3 (L) 3.5 - 5.1 mmol/L Final         Passed - Patient is not pregnant      Passed - Last BP in normal range    BP Readings from Last 1 Encounters:  09/28/23 (!) 107/50         Passed - Valid encounter within last 6 months    Recent Outpatient Visits           1 month ago Preoperative examination   [name], [name]   2 months ago Type 2 diabetes mellitus with microalbuminuria, without long-term current use of insulin [facility])    [facility] [name], [name]   4 months ago Upper respiratory tract infection, unspecified type   [facility] Mecum, Oswaldo Conroy, PA-C   4 months ago Intractable migraine with aura without status migrainosus   [facility] [name], [name]   6 months ago Intractable migraine with aura without status migrainosus   [facility] [name], [name]

## 2023-11-23 ENCOUNTER — Other Ambulatory Visit: Payer: Self-pay | Admitting: Emergency Medicine

## 2023-11-23 DIAGNOSIS — F419 Anxiety disorder, unspecified: Secondary | ICD-10-CM

## 2023-11-23 MED ORDER — CITALOPRAM HYDROBROMIDE 40 MG PO TABS: 40.0000 mg | ORAL_TABLET | Freq: Every day | ORAL | 0 refills | Status: DC

## 2023-12-08 ENCOUNTER — Ambulatory Visit: Admitting: Internal Medicine

## 2023-12-15 ENCOUNTER — Ambulatory Visit: Admitting: Nurse Practitioner

## 2023-12-15 NOTE — Progress Notes (Deleted)
 There were no vitals taken for this visit.   Subjective:    Patient ID: Lori Potts, female    DOB: [birth year], 78 y.o.   MRN: [id number]  HPI: Lori Potts is a 78 y.o. female  No chief complaint on file.   Discussed the use of AI scribe software for clinical note transcription with the patient, who gave verbal consent to proceed.  History of Present Illness          06/29/2023    8:17 AM 06/15/2023   10:11 AM 05/11/2023    1:00 PM  Depression screen PHQ 2/9  Decreased Interest 0 0 0  Down, Depressed, Hopeless 0 0 0  PHQ - 2 Score 0 0 0  Altered sleeping 0    Tired, decreased energy 0    Change in appetite 0    Feeling bad or failure about yourself  0    Trouble concentrating 0    Moving slowly or fidgety/restless 0    Suicidal thoughts 0    PHQ-9 Score 0    Difficult doing work/chores Not difficult at all      Relevant past medical, surgical, family and social history reviewed and updated as indicated. Interim medical history since our last visit reviewed. Allergies and medications reviewed and updated.  Review of Systems  Per HPI unless specifically indicated above     Objective:    There were no vitals taken for this visit.  {Vitals History (Optional):23777} Wt Readings from Last 3 Encounters:  09/27/23 171 lb (77.6 kg)  09/19/23 171 lb (77.6 kg)  09/12/23 173 lb 9.6 oz (78.7 kg)    Physical Exam Physical Exam    Results for orders placed or performed during the hospital encounter of 09/27/23  Glucose, capillary   Collection Time: 09/27/23  6:11 AM  Result Value Ref Range   Glucose-Capillary 105 (H) 70 - 99 mg/dL  Glucose, capillary   Collection Time: 09/27/23  9:47 AM  Result Value Ref Range   Glucose-Capillary 171 (H) 70 - 99 mg/dL   Comment 1 Notify RN   Glucose, capillary   Collection Time: 09/27/23  5:25 PM  Result Value Ref Range   Glucose-Capillary 230 (H) 70 - 99 mg/dL  Glucose, capillary   Collection Time: 09/27/23 10:08  PM  Result Value Ref Range   Glucose-Capillary 133 (H) 70 - 99 mg/dL  CBC   Collection Time: 09/28/23  3:32 AM  Result Value Ref Range   WBC 13.1 (H) 4.0 - 10.5 K/uL   RBC 4.14 3.87 - 5.11 MIL/uL   Hemoglobin 10.3 (L) 12.0 - 15.0 g/dL   HCT 13.0 (L) 86.5 - 78.4 %   MCV 77.8 (L) 80.0 - 100.0 fL   MCH 24.9 (L) 26.0 - 34.0 pg   MCHC 32.0 30.0 - 36.0 g/dL   RDW 69.6 (H) 29.5 - 28.4 %   Platelets 413 (H) 150 - 400 K/uL   nRBC 0.0 0.0 - 0.2 %  Basic metabolic panel   Collection Time: 09/28/23  3:32 AM  Result Value Ref Range   Sodium 137 135 - 145 mmol/L   Potassium 3.6 3.5 - 5.1 mmol/L   Chloride 104 98 - 111 mmol/L   CO2 23 22 - 32 mmol/L   Glucose, Bld 158 (H) 70 - 99 mg/dL   BUN 23 8 - 23 mg/dL   Creatinine, Ser 1.32 (H) 0.44 - 1.00 mg/dL   Calcium  9.3 8.9 - 10.3 mg/dL   GFR,  Estimated 55 (L) >60 mL/min   Anion gap 10 5 - 15  Glucose, capillary   Collection Time: 09/28/23  7:59 AM  Result Value Ref Range   Glucose-Capillary 178 (H) 70 - 99 mg/dL   {Labs (ZOXWRUEA):54098}    Assessment & Plan:   Problem List Items Addressed This Visit   None    Assessment and Plan Assessment & Plan         Follow up plan: No follow-ups on file.

## 2023-12-17 DIAGNOSIS — S96911A Strain of unspecified muscle and tendon at ankle and foot level, right foot, initial encounter: Secondary | ICD-10-CM | POA: Diagnosis not present

## 2023-12-17 DIAGNOSIS — S93401A Sprain of unspecified ligament of right ankle, initial encounter: Secondary | ICD-10-CM | POA: Diagnosis not present

## 2023-12-18 ENCOUNTER — Ambulatory Visit: Admitting: Nurse Practitioner

## 2023-12-18 NOTE — Progress Notes (Deleted)
 There were no vitals taken for this visit.   Subjective:    Patient ID: Lori Potts, female    DOB: [birth year], 78 y.o.   MRN: [id number]  HPI: Lori Potts is a 78 y.o. female  No chief complaint on file.   Discussed the use of AI scribe software for clinical note transcription with the patient, who gave verbal consent to proceed.  History of Present Illness          06/29/2023    8:17 AM 06/15/2023   10:11 AM 05/11/2023    1:00 PM  Depression screen PHQ 2/9  Decreased Interest 0 0 0  Down, Depressed, Hopeless 0 0 0  PHQ - 2 Score 0 0 0  Altered sleeping 0    Tired, decreased energy 0    Change in appetite 0    Feeling bad or failure about yourself  0    Trouble concentrating 0    Moving slowly or fidgety/restless 0    Suicidal thoughts 0    PHQ-9 Score 0    Difficult doing work/chores Not difficult at all      Relevant past medical, surgical, family and social history reviewed and updated as indicated. Interim medical history since our last visit reviewed. Allergies and medications reviewed and updated.  Review of Systems  Per HPI unless specifically indicated above     Objective:    There were no vitals taken for this visit.  {Vitals History (Optional):23777} Wt Readings from Last 3 Encounters:  09/27/23 171 lb (77.6 kg)  09/19/23 171 lb (77.6 kg)  09/12/23 173 lb 9.6 oz (78.7 kg)    Physical Exam Physical Exam    Results for orders placed or performed during the hospital encounter of 09/27/23  Glucose, capillary   Collection Time: 09/27/23  6:11 AM  Result Value Ref Range   Glucose-Capillary 105 (H) 70 - 99 mg/dL  Glucose, capillary   Collection Time: 09/27/23  9:47 AM  Result Value Ref Range   Glucose-Capillary 171 (H) 70 - 99 mg/dL   Comment 1 Notify RN   Glucose, capillary   Collection Time: 09/27/23  5:25 PM  Result Value Ref Range   Glucose-Capillary 230 (H) 70 - 99 mg/dL  Glucose, capillary   Collection Time: 09/27/23 10:08  PM  Result Value Ref Range   Glucose-Capillary 133 (H) 70 - 99 mg/dL  CBC   Collection Time: 09/28/23  3:32 AM  Result Value Ref Range   WBC 13.1 (H) 4.0 - 10.5 K/uL   RBC 4.14 3.87 - 5.11 MIL/uL   Hemoglobin 10.3 (L) 12.0 - 15.0 g/dL   HCT 13.0 (L) 86.5 - 78.4 %   MCV 77.8 (L) 80.0 - 100.0 fL   MCH 24.9 (L) 26.0 - 34.0 pg   MCHC 32.0 30.0 - 36.0 g/dL   RDW 69.6 (H) 29.5 - 28.4 %   Platelets 413 (H) 150 - 400 K/uL   nRBC 0.0 0.0 - 0.2 %  Basic metabolic panel   Collection Time: 09/28/23  3:32 AM  Result Value Ref Range   Sodium 137 135 - 145 mmol/L   Potassium 3.6 3.5 - 5.1 mmol/L   Chloride 104 98 - 111 mmol/L   CO2 23 22 - 32 mmol/L   Glucose, Bld 158 (H) 70 - 99 mg/dL   BUN 23 8 - 23 mg/dL   Creatinine, Ser 1.32 (H) 0.44 - 1.00 mg/dL   Calcium  9.3 8.9 - 10.3 mg/dL   GFR,  Estimated 55 (L) >60 mL/min   Anion gap 10 5 - 15  Glucose, capillary   Collection Time: 09/28/23  7:59 AM  Result Value Ref Range   Glucose-Capillary 178 (H) 70 - 99 mg/dL   {Labs (ZOXWRUEA):54098}    Assessment & Plan:   Problem List Items Addressed This Visit   None    Assessment and Plan Assessment & Plan         Follow up plan: No follow-ups on file.

## 2023-12-18 NOTE — Progress Notes (Unsigned)
 There were no vitals taken for this visit.   Subjective:    Patient ID: Lori Potts, female    DOB: [birth year], 78 y.o.   MRN: [id number]  HPI: Lori Potts is a 78 y.o. female  No chief complaint on file.   Discussed the use of AI scribe software for clinical note transcription with the patient, who gave verbal consent to proceed.  History of Present Illness          06/29/2023    8:17 AM 06/15/2023   10:11 AM 05/11/2023    1:00 PM  Depression screen PHQ 2/9  Decreased Interest 0 0 0  Down, Depressed, Hopeless 0 0 0  PHQ - 2 Score 0 0 0  Altered sleeping 0    Tired, decreased energy 0    Change in appetite 0    Feeling bad or failure about yourself  0    Trouble concentrating 0    Moving slowly or fidgety/restless 0    Suicidal thoughts 0    PHQ-9 Score 0    Difficult doing work/chores Not difficult at all      Relevant past medical, surgical, family and social history reviewed and updated as indicated. Interim medical history since our last visit reviewed. Allergies and medications reviewed and updated.  Review of Systems  Per HPI unless specifically indicated above     Objective:    There were no vitals taken for this visit.  {Vitals History (Optional):23777} Wt Readings from Last 3 Encounters:  09/27/23 171 lb (77.6 kg)  09/19/23 171 lb (77.6 kg)  09/12/23 173 lb 9.6 oz (78.7 kg)    Physical Exam Physical Exam    Results for orders placed or performed during the hospital encounter of 09/27/23  Glucose, capillary   Collection Time: 09/27/23  6:11 AM  Result Value Ref Range   Glucose-Capillary 105 (H) 70 - 99 mg/dL  Glucose, capillary   Collection Time: 09/27/23  9:47 AM  Result Value Ref Range   Glucose-Capillary 171 (H) 70 - 99 mg/dL   Comment 1 Notify RN   Glucose, capillary   Collection Time: 09/27/23  5:25 PM  Result Value Ref Range   Glucose-Capillary 230 (H) 70 - 99 mg/dL  Glucose, capillary   Collection Time: 09/27/23 10:08  PM  Result Value Ref Range   Glucose-Capillary 133 (H) 70 - 99 mg/dL  CBC   Collection Time: 09/28/23  3:32 AM  Result Value Ref Range   WBC 13.1 (H) 4.0 - 10.5 K/uL   RBC 4.14 3.87 - 5.11 MIL/uL   Hemoglobin 10.3 (L) 12.0 - 15.0 g/dL   HCT 13.0 (L) 86.5 - 78.4 %   MCV 77.8 (L) 80.0 - 100.0 fL   MCH 24.9 (L) 26.0 - 34.0 pg   MCHC 32.0 30.0 - 36.0 g/dL   RDW 69.6 (H) 29.5 - 28.4 %   Platelets 413 (H) 150 - 400 K/uL   nRBC 0.0 0.0 - 0.2 %  Basic metabolic panel   Collection Time: 09/28/23  3:32 AM  Result Value Ref Range   Sodium 137 135 - 145 mmol/L   Potassium 3.6 3.5 - 5.1 mmol/L   Chloride 104 98 - 111 mmol/L   CO2 23 22 - 32 mmol/L   Glucose, Bld 158 (H) 70 - 99 mg/dL   BUN 23 8 - 23 mg/dL   Creatinine, Ser 1.32 (H) 0.44 - 1.00 mg/dL   Calcium  9.3 8.9 - 10.3 mg/dL   GFR,  Estimated 55 (L) >60 mL/min   Anion gap 10 5 - 15  Glucose, capillary   Collection Time: 09/28/23  7:59 AM  Result Value Ref Range   Glucose-Capillary 178 (H) 70 - 99 mg/dL   {Labs (ZOXWRUEA):54098}    Assessment & Plan:   Problem List Items Addressed This Visit   None    Assessment and Plan Assessment & Plan         Follow up plan: No follow-ups on file.

## 2023-12-19 ENCOUNTER — Ambulatory Visit (INDEPENDENT_AMBULATORY_CARE_PROVIDER_SITE_OTHER): Admitting: Nurse Practitioner

## 2023-12-19 ENCOUNTER — Encounter: Payer: Self-pay | Admitting: Nurse Practitioner

## 2023-12-19 VITALS — BP 122/70 | HR 90 | Resp 18 | Ht 63.5 in | Wt 174.9 lb

## 2023-12-19 DIAGNOSIS — E1129 Type 2 diabetes mellitus with other diabetic kidney complication: Secondary | ICD-10-CM | POA: Diagnosis not present

## 2023-12-19 DIAGNOSIS — F419 Anxiety disorder, unspecified: Secondary | ICD-10-CM

## 2023-12-19 DIAGNOSIS — J309 Allergic rhinitis, unspecified: Secondary | ICD-10-CM

## 2023-12-19 DIAGNOSIS — J4540 Moderate persistent asthma, uncomplicated: Secondary | ICD-10-CM

## 2023-12-19 DIAGNOSIS — I10 Essential (primary) hypertension: Secondary | ICD-10-CM

## 2023-12-19 DIAGNOSIS — R809 Proteinuria, unspecified: Secondary | ICD-10-CM | POA: Diagnosis not present

## 2023-12-19 DIAGNOSIS — G43119 Migraine with aura, intractable, without status migrainosus: Secondary | ICD-10-CM | POA: Diagnosis not present

## 2023-12-19 DIAGNOSIS — N1831 Chronic kidney disease, stage 3a: Secondary | ICD-10-CM

## 2023-12-19 DIAGNOSIS — E785 Hyperlipidemia, unspecified: Secondary | ICD-10-CM | POA: Diagnosis not present

## 2023-12-19 DIAGNOSIS — G4709 Other insomnia: Secondary | ICD-10-CM

## 2023-12-19 DIAGNOSIS — Z5189 Encounter for other specified aftercare: Secondary | ICD-10-CM | POA: Diagnosis not present

## 2023-12-19 DIAGNOSIS — S7001XA Contusion of right hip, initial encounter: Secondary | ICD-10-CM | POA: Diagnosis not present

## 2023-12-19 DIAGNOSIS — I700 Atherosclerosis of aorta: Secondary | ICD-10-CM

## 2023-12-19 DIAGNOSIS — E1169 Type 2 diabetes mellitus with other specified complication: Secondary | ICD-10-CM

## 2023-12-19 DIAGNOSIS — K219 Gastro-esophageal reflux disease without esophagitis: Secondary | ICD-10-CM

## 2023-12-19 DIAGNOSIS — F330 Major depressive disorder, recurrent, mild: Secondary | ICD-10-CM

## 2023-12-19 MED ORDER — ESOMEPRAZOLE MAGNESIUM 40 MG PO CPDR: 40.0000 mg | DELAYED_RELEASE_CAPSULE | Freq: Every day | ORAL | 1 refills | Status: DC

## 2023-12-19 MED ORDER — HYDROXYZINE PAMOATE 25 MG PO CAPS: 25.0000 mg | ORAL_CAPSULE | Freq: Three times a day (TID) | ORAL | 0 refills | Status: DC | PRN

## 2023-12-19 MED ORDER — TOPIRAMATE 25 MG PO TABS: 25.0000 mg | ORAL_TABLET | Freq: Every day | ORAL | 1 refills | Status: AC

## 2023-12-20 ENCOUNTER — Other Ambulatory Visit: Payer: Medicare Other | Admitting: Pharmacist

## 2023-12-20 ENCOUNTER — Other Ambulatory Visit: Payer: Self-pay | Admitting: Pharmacist

## 2023-12-20 ENCOUNTER — Encounter: Payer: Self-pay | Admitting: Nurse Practitioner

## 2023-12-20 ENCOUNTER — Other Ambulatory Visit: Payer: Self-pay | Admitting: Nurse Practitioner

## 2023-12-20 DIAGNOSIS — G43119 Migraine with aura, intractable, without status migrainosus: Secondary | ICD-10-CM

## 2023-12-20 DIAGNOSIS — I10 Essential (primary) hypertension: Secondary | ICD-10-CM

## 2023-12-20 DIAGNOSIS — E1129 Type 2 diabetes mellitus with other diabetic kidney complication: Secondary | ICD-10-CM

## 2023-12-20 DIAGNOSIS — N1832 Chronic kidney disease, stage 3b: Secondary | ICD-10-CM

## 2023-12-20 DIAGNOSIS — H60312 Diffuse otitis externa, left ear: Secondary | ICD-10-CM

## 2023-12-20 DIAGNOSIS — N1831 Chronic kidney disease, stage 3a: Secondary | ICD-10-CM

## 2023-12-20 DIAGNOSIS — R42 Dizziness and giddiness: Secondary | ICD-10-CM

## 2023-12-20 DIAGNOSIS — R809 Proteinuria, unspecified: Secondary | ICD-10-CM

## 2023-12-20 DIAGNOSIS — J4540 Moderate persistent asthma, uncomplicated: Secondary | ICD-10-CM

## 2023-12-20 LAB — CBC WITH DIFFERENTIAL/PLATELET
Absolute Lymphocytes: 1171 {cells}/uL (ref 850–3900)
Absolute Monocytes: 576 {cells}/uL (ref 200–950)
Basophils Absolute: 70 {cells}/uL (ref 0–200)
Basophils Relative: 1.1 %
Eosinophils Absolute: 211 {cells}/uL (ref 15–500)
Eosinophils Relative: 3.3 %
HCT: 33.6 % — ABNORMAL LOW (ref 35.0–45.0)
Hemoglobin: 10.3 g/dL — ABNORMAL LOW (ref 11.7–15.5)
MCH: 23.3 pg — ABNORMAL LOW (ref 27.0–33.0)
MCHC: 30.7 g/dL — ABNORMAL LOW (ref 32.0–36.0)
MCV: 75.8 fL — ABNORMAL LOW (ref 80.0–100.0)
MPV: 11.2 fL (ref 7.5–12.5)
Monocytes Relative: 9 %
Neutro Abs: 4371 {cells}/uL (ref 1500–7800)
Neutrophils Relative %: 68.3 %
Platelets: 507 10*3/uL — ABNORMAL HIGH (ref 140–400)
RBC: 4.43 10*6/uL (ref 3.80–5.10)
RDW: 15.9 % — ABNORMAL HIGH (ref 11.0–15.0)
Total Lymphocyte: 18.3 %
WBC: 6.4 10*3/uL (ref 3.8–10.8)

## 2023-12-20 LAB — MICROALBUMIN / CREATININE URINE RATIO
Creatinine, Urine: 95 mg/dL (ref 20–275)
Microalb Creat Ratio: 215 mg/g{creat} — ABNORMAL HIGH (ref <30)
Microalb, Ur: 20.4 mg/dL

## 2023-12-20 LAB — COMPREHENSIVE METABOLIC PANEL WITH GFR
AG Ratio: 1.6 (calc) (ref 1.0–2.5)
ALT: 12 U/L (ref 6–29)
AST: 19 U/L (ref 10–35)
Albumin: 4.1 g/dL (ref 3.6–5.1)
Alkaline phosphatase (APISO): 84 U/L (ref 37–153)
BUN/Creatinine Ratio: 12 (calc) (ref 6–22)
BUN: 19 mg/dL (ref 7–25)
CO2: 24 mmol/L (ref 20–32)
Calcium: 9.7 mg/dL (ref 8.6–10.4)
Chloride: 109 mmol/L (ref 98–110)
Creat: 1.54 mg/dL — ABNORMAL HIGH (ref 0.60–1.00)
Globulin: 2.5 g/dL (ref 1.9–3.7)
Glucose, Bld: 136 mg/dL — ABNORMAL HIGH (ref 65–99)
Potassium: 4 mmol/L (ref 3.5–5.3)
Sodium: 141 mmol/L (ref 135–146)
Total Bilirubin: 0.3 mg/dL (ref 0.2–1.2)
Total Protein: 6.6 g/dL (ref 6.1–8.1)
eGFR: 35 mL/min/{1.73_m2} — ABNORMAL LOW (ref >=60)

## 2023-12-20 LAB — LIPID PANEL
Cholesterol: 163 mg/dL (ref <200)
HDL: 70 mg/dL (ref >=50)
LDL Cholesterol (Calc): 79 mg/dL
Non-HDL Cholesterol (Calc): 93 mg/dL (ref <130)
Total CHOL/HDL Ratio: 2.3 (calc) (ref <5.0)
Triglycerides: 67 mg/dL (ref <150)

## 2023-12-20 LAB — HEMOGLOBIN A1C
Hgb A1c MFr Bld: 6.7 % — ABNORMAL HIGH (ref <5.7)
Mean Plasma Glucose: 146 mg/dL
eAG (mmol/L): 8.1 mmol/L

## 2023-12-20 MED ORDER — MONTELUKAST SODIUM 10 MG PO TABS
10.0000 mg | ORAL_TABLET | Freq: Every day | ORAL | 1 refills | Status: AC
Start: 2023-12-20 — End: ?

## 2023-12-20 NOTE — Patient Instructions (Signed)
 Goals Addressed             This Visit's Progress    Pharmacy Goals       If you need to reach out to patient assistance programs regarding refills or to find out the status of your application, you can do so by calling:   For Marcelline Deist and [place] assistance, [place] at: [phone] For Trulicity assistance, [employer] at: [phone]   The goal A1c is less than 7%. This is the best way to reduce the risk of the long term complications of diabetes, including heart disease, kidney disease, eye disease, strokes, and nerve damage. An A1c of less than 7% corresponds with fasting sugars less than 130 and 2 hour after meal sugars less than 180.    Thank you!    Estelle Grumbles, PharmD, [facility] [phone]

## 2023-12-20 NOTE — Progress Notes (Signed)
 12/20/2023 Name: Lori Potts MRN: [id number] DOB: July 27, 1946  Chief Complaint  Patient presents with   Medication Management   Medication Assistance    Lori Potts is a 78 y.o. year old female who presented for a telephone visit.   They were referred to the pharmacist by their PCP for assistance in managing medication access.      Subjective:   Care Team: Primary Care Provider: Quinton Buckler, [name] Cardiologist: Barbie Boon, MD; Next Scheduled Visit: 01/04/2024 Neurologist: Herold Lora, MD    Medication Access/Adherence  Current Pharmacy:  [facility] [address], [place] - [address] [place] [address] Phone: [phone] Fax: [phone]  [facility] - [place], [place] - [address] [place] [address] Phone: [phone] Fax: [phone]  [id number] [place], [place] - [address] [place] [address] Phone: [phone] Fax: [phone]  [facility] - [place], [place] - [address] [place] [address] [place] [address] Phone: [phone] Fax: [phone]   Patient reports affordability concerns with their medications: No Patient reports access/transportation concerns to their pharmacy: No  Patient reports adherence concerns with their medications:  No       Diabetes:   Current medications:  - Farxiga  5 mg daily - Trulicity  0.75 mg weekly    Medications tried in the past: Ozempic    Current glucose readings: before breakfast ranging primarily 91-116   Patient denies hypoglycemic s/sx including dizziness, shakiness, sweating.      Current physical activity: stays active through the home throughout the day, but often limited by back pain   Current medication access support:  - Enrolled in patient assistance for Farxiga  through [employer] and Trulicity  patient assistance from [employer] through 08/21/2024      Hypertension:   Current medications:  - losartan  25 mg daily - propranolol  20 mg three times daily   Patient has a validated, automated, upper arm home BP cuff Denies checking home blood pressure recently, but checked in office yesterday   Patient recent denies hypotensive s/sx including dizziness, lightheadedness.    Current physical activity: movement limited by hip pain, but stays active throughout the day     Asthma and Allergic Rhinitis:   Current medications: - Albuterol  HFA 2 puffs every 6 hours as needed  - Breztri inhaler - 2 puffs twice daily             Confirms rinses and spits out after each use - Montelukast  10 mg nightly  - Azelastine  nasal spray twice daily as needed   Medications tried in the past: Breo, Trelegy (cost)   Reports breathing has been good recently; denies needing albuterol  rescue inhaler recently   Current medication access support:  - Enrolled in patient assistance for Breztri through [employer] through 08/21/2024     Objective:  Lab Results  Component Value Date   HGBA1C 6.7 (H) 12/19/2023    Lab Results  Component Value Date   CREATININE 1.54 (H) 12/19/2023   BUN 19 12/19/2023   NA 141 12/19/2023   K 4.0 12/19/2023   CL 109 12/19/2023   CO2 24 12/19/2023    Lab Results  Component Value Date   CHOL 163 12/19/2023   HDL 70 12/19/2023   LDLCALC 79 12/19/2023   TRIG 67 12/19/2023   CHOLHDL 2.3 12/19/2023   BP Readings from Last 3 Encounters:  12/19/23  122/70  09/28/23 (!) 107/50  09/19/23 139/65   Pulse Readings from Last 3 Encounters:  12/19/23 90  09/28/23 71  09/19/23 66     Medications Reviewed Today     Reviewed by Ardis Becton, RPH-CPP (Pharmacist) on 12/20/23 at 1514  Med List Status: <None>   Medication Order Taking? Sig Documenting Provider Last Dose Status Informant  albuterol  (VENTOLIN  HFA) 108 (90 Base) MCG/ACT inhaler [id number]  Inhale 2 puffs into the lungs every 6 (six) hours as needed for  wheezing or shortness of breath. Quinton Buckler, [name]  Active Self  Alcohol Swabs  (DROPSAFE ALCOHOL PREP) 70 % PADS [id number]  USE TO TEST BLOOD SUGAR TWO TIMES DAILY Pender, Julie F, [name]  Active   Blood Glucose Monitoring Suppl (TRUE METRIX AIR GLUCOSE METER) w/Device KIT [id number]  USE AS DIRECTED Pender, Julie F, [name]  Active   Budeson-Glycopyrrol-Formoterol  (BREZTRI AEROSPHERE) 160-9-4.8 MCG/ACT Sudie Ely [id number] Yes Inhale 2 puffs into the lungs 2 (two) times daily. [provider] Taking Active Self  busPIRone  (BUSPAR ) 5 MG tablet [id number]  TAKE 1 TABLET THREE TIMES DAILY AS NEEDED Pender, Julie F, [name]  Active Self  butalbital -acetaminophen -caffeine  (FIORICET) 50-325-40 MG tablet [id number]  Take 1 tablet by mouth every 6 (six) hours as needed for headache. Quinton Buckler, [name]  Active Self  citalopram  (CELEXA ) 40 MG tablet [id number]  Take 1 tablet (40 mg total) by mouth daily. Quinton Buckler, [name]  Active   Continuous Glucose Sensor (FREESTYLE LIBRE 3 SENSOR) [name] [id number]  1 Device by Does not apply route every 14 (fourteen) days. Quinton Buckler, [name]  Active Self  dapagliflozin  propanediol (FARXIGA ) 5 MG TABS tablet [id number] Yes Take 1 tablet (5 mg total) by mouth daily before breakfast. Quinton Buckler, [name] Taking Active   Dulaglutide  (TRULICITY ) 0.75 MG/0.5ML SOPN [id number] Yes Inject 0.75 mg into the skin once a week. Patient receives via [employer] Patient Assistance through Dec 2023  Patient taking differently: Inject 0.75 mg into the skin every Friday at 6 PM. Patient receives via [employer] Patient Assistance through Dec 2023   Quinton Buckler, [name] Taking Active Self  esomeprazole  (NEXIUM ) 40 MG capsule [id number]  Take 1 capsule (40 mg total) by mouth at bedtime. Quinton Buckler, [name]  Active   HYDROmorphone  (DILAUDID ) 2 MG tablet [id number]  Take 1-2 tablets (2-4 mg total) by mouth every 6 (six) hours as needed for severe pain (pain score 7-10) (severe breakthrough pain not  responding to chronic oxycodone ). Edmisten, Kristie L, PA  Active   hydrOXYzine (VISTARIL) 25 MG capsule [id number]  Take 1 capsule (25 mg total) by mouth every 8 (eight) hours as needed. Quinton Buckler, [name]  Active   losartan  (COZAAR ) 25 MG tablet [id number] Yes Take 1 tablet (25 mg total) by mouth every evening. Quinton Buckler, [name] Taking Active   meclizine  (ANTIVERT ) 25 MG tablet [id number]  Take 1 tablet (25 mg total) by mouth 3 (three) times daily as needed for dizziness. Pender, Julie F, [name]  Active Self  methocarbamol  (ROBAXIN ) 500 MG tablet [id number]  Take 1 tablet (500 mg total) by mouth every 6 (six) hours as needed for muscle spasms. Edmisten, Kristie L, PA  Active   montelukast  (SINGULAIR ) 10 MG tablet [id number] Yes Take 10 mg by mouth at bedtime. [provider]  Active   ondansetron  (ZOFRAN ) 4 MG tablet [id number]  Take 1 tablet (4 mg total) by mouth every 6 (six) hours as needed for nausea.  Edmisten, Kristie L, PA  Active   ondansetron  (ZOFRAN -ODT) 4 MG disintegrating tablet [id number]  Take 1 tablet (4 mg total) by mouth every 8 (eight) hours as needed for nausea or vomiting. Quinton Buckler, [name]  Active Self  oxyCODONE -acetaminophen  (PERCOCET) 10-325 MG tablet [id number]  Take 1 tablet by mouth every 6 (six) hours as needed for pain. [provider]  Active   propranolol  (INDERAL ) 20 MG tablet [id number] Yes TAKE 1 TABLET THREE TIMES DAILY Pender, Julie F, [name] Taking Active Self  rosuvastatin  (CRESTOR ) 40 MG tablet [id number]  Take 1 tablet (40 mg total) by mouth at bedtime. Quinton Buckler, [name]  Active   topiramate  (TOPAMAX ) 25 MG tablet [id number]  Take 1 tablet (25 mg total) by mouth at bedtime. Quinton Buckler, [name]  Active   TRUEplus Lancets 33G MISC [id number]  TEST BLOOD SUGAR TWO TIMES DAILY Pender, Julie F, [name]  Active Self              Assessment/Plan:   Advise patient to expect a call from [facility] for scheduling initial  appointment as referred by PCP  Send message to clinical team regarding request from patient for renewal of her montelukast  prescription  Diabetes: - Reviewed long term cardiovascular and renal outcomes of uncontrolled blood sugar - Reviewed goal A1c, goal fasting, and goal 2 hour post prandial glucose - Reviewed dietary modifications including importance of having regular well-balanced meals while controlling carbohydrate portion sizes - Recommend to check glucose, keep log of results and have this record to review during future medical appointments - Patient to follow up with [employer] as needed for refills of Trulicity  and [employer] as needed for refills of Farxiga    Hypertension: - Reviewed long term cardiovascular and renal outcomes of uncontrolled blood pressure - Recommended to check home blood pressure and heart rate, keep log of results and have this record to review during future medical appointments     Asthma: - Reviewed appropriate inhaler technique.  - Patient to follow up with [employer] as needed for refills of Breztri   Follow Up Plan: Clinical Pharmacist will follow up with patient by telephone on 06/19/2024 at 1:30 PM    Arthur Lash, PharmD, [facility] [phone]

## 2023-12-20 NOTE — Telephone Encounter (Signed)
 Historical provider.

## 2023-12-20 NOTE — Progress Notes (Signed)
 Patient requesting renewal of her montelukast  prescription  Thank you!  Lori Potts

## 2023-12-23 NOTE — Telephone Encounter (Signed)
 Requested medication (s) are due for refill today: Yes  Requested medication (s) are on the active medication list: Yes  Last refill:  09/12/23  Future visit scheduled: No  Notes to clinic:  Unable to refill per protocol, cannot delegate.      Requested Prescriptions  Pending Prescriptions Disp Refills   meclizine  (ANTIVERT ) 25 MG tablet [Pharmacy Med Name: MECLIZINE  25 MG TABLET] 90 tablet 0    Sig: TAKE 1 TABLET BY MOUTH 3 TIMES DAILY AS NEEDED FOR DIZZINESS.     Not Delegated - Gastroenterology: Antiemetics Failed - 12/23/2023  8:58 PM      Failed - This refill cannot be delegated      Failed - Valid encounter within last 6 months    Recent Outpatient Visits           4 days ago Essential (primary) hypertension   [facility] Donny Gall F, [name]              Signed Prescriptions Disp Refills   FARXIGA  5 MG TABS tablet 90 tablet 1    Sig: TAKE 1 TABLET BY MOUTH DAILY BEFORE BREAKFAST.     Endocrinology:  Diabetes - SGLT2 Inhibitors Failed - 12/23/2023  8:58 PM      Failed - Cr in normal range and within 360 days    Creat  Date Value Ref Range Status  12/19/2023 1.54 (H) 0.60 - 1.00 mg/dL Final   Creatinine, Urine  Date Value Ref Range Status  12/19/2023 95 20 - 275 mg/dL Final         Failed - eGFR in normal range and within 360 days    GFR, Est African American  Date Value Ref Range Status  02/18/2020 101 > OR = 60 mL/min/1.67m2 Final   GFR, Est Non African American  Date Value Ref Range Status  02/18/2020 87 > OR = 60 mL/min/1.32m2 Final   GFR, Estimated  Date Value Ref Range Status  09/28/2023 55 (L) >60 mL/min Final    Comment:    (NOTE) Calculated using the CKD-EPI Creatinine Equation (2021)    eGFR  Date Value Ref Range Status  12/19/2023 35 (L) > OR = 60 mL/min/1.22m2 Final         Failed - Valid encounter within last 6 months    Recent Outpatient Visits           4 days ago Essential (primary) hypertension    [facility] Quinton Buckler, [name]              Passed - HBA1C is between 0 and 7.9 and within 180 days    Hemoglobin A1C  Date Value Ref Range Status  03/31/2019 6.6  Final   Hgb A1c MFr Bld  Date Value Ref Range Status  12/19/2023 6.7 (H) <5.7 % Final    Comment:    For someone without known diabetes, a hemoglobin A1c value of 6.5% or greater indicates that they may have  diabetes and this should be confirmed with a follow-up  test. . For someone with known diabetes, a value <7% indicates  that their diabetes is well controlled and a value  greater than or equal to 7% indicates suboptimal  control. A1c targets should be individualized based on  duration of diabetes, age, comorbid conditions, and  other considerations. . Currently, no consensus exists regarding use of hemoglobin A1c for diagnosis of diabetes for children. Aaron Aas  propranolol  (INDERAL ) 20 MG tablet 270 tablet 1    Sig: TAKE 1 TABLET BY MOUTH THREE TIMES A DAY     Cardiovascular:  Beta Blockers Failed - 12/23/2023  8:58 PM      Failed - Valid encounter within last 6 months    Recent Outpatient Visits           4 days ago Essential (primary) hypertension   [facility] Quinton Buckler, [name]              Passed - Last BP in normal range    BP Readings from Last 1 Encounters:  12/19/23 122/70         Passed - Last Heart Rate in normal range    Pulse Readings from Last 1 Encounters:  12/19/23 90         Refused Prescriptions Disp Refills   ofloxacin  (FLOXIN ) 0.3 % OTIC solution [Pharmacy Med Name: OFLOXACIN  0.3% EAR DROPS] 5 mL 0    Sig: PLACE 5 DROPS INTO THE LEFT EAR DAILY.     Off-Protocol Failed - 12/23/2023  8:58 PM      Failed - Medication not assigned to a protocol, review manually.      Failed - Valid encounter within last 12 months    Recent Outpatient Visits           4 days ago Essential (primary) hypertension   [facility] Quinton Buckler, [name]

## 2023-12-26 DIAGNOSIS — Z96651 Presence of right artificial knee joint: Secondary | ICD-10-CM | POA: Diagnosis not present

## 2023-12-26 DIAGNOSIS — S93401A Sprain of unspecified ligament of right ankle, initial encounter: Secondary | ICD-10-CM | POA: Diagnosis not present

## 2023-12-26 DIAGNOSIS — M25561 Pain in right knee: Secondary | ICD-10-CM | POA: Diagnosis not present

## 2023-12-28 ENCOUNTER — Telehealth: Admitting: Nurse Practitioner

## 2023-12-28 ENCOUNTER — Encounter: Payer: Self-pay | Admitting: Nurse Practitioner

## 2023-12-28 DIAGNOSIS — L299 Pruritus, unspecified: Secondary | ICD-10-CM

## 2023-12-28 DIAGNOSIS — R21 Rash and other nonspecific skin eruption: Secondary | ICD-10-CM

## 2023-12-28 MED ORDER — TRIAMCINOLONE ACETONIDE 0.1 % EX OINT: 1.0000 | TOPICAL_OINTMENT | Freq: Two times a day (BID) | CUTANEOUS | 0 refills | Status: DC

## 2023-12-28 MED ORDER — FAMOTIDINE 20 MG PO TABS: 20.0000 mg | ORAL_TABLET | Freq: Two times a day (BID) | ORAL | 0 refills | Status: DC

## 2023-12-28 NOTE — Progress Notes (Signed)
 Name: Lori Potts   MRN: [id number]    DOB: [birth year]   Date:12/28/2023       Progress Note  Subjective  Chief Complaint  Chief Complaint  Patient presents with   Rash    I connected with  [name]  on 12/28/23 at  2:40 PM EDT by a video enabled telemedicine application and verified that I am speaking with the correct person using two identifiers.  I discussed the limitations of evaluation and management by telemedicine and the availability of in person appointments. The patient expressed understanding and agreed to proceed with a virtual visit  Staff also discussed with the patient that there may be a patient responsible charge related to this service. Patient Location: home Provider Location: [facility] Additional Individuals present: alone  HPI   Discussed the use of AI scribe software for clinical note transcription with the patient, who gave verbal consent to proceed.  History of Present Illness [name] is a 78 year old female who presents with itching and dry skin on her scalp.  She has been experiencing itching primarily on her scalp for approximately one month. Initially, she applied cream to the affected area, but the itching has continued, which she describes as itching 'from the inside'.  No new medications have been introduced except for a sleeping medicine started before the onset of symptoms. She denies any recent changes in diet or adventurous eating habits.  She has been using a double antibiotic ointment on her scalp without relief. Her forehead appears darkened, and she describes it as 'getting ready to meet up here'. She continues to take her allergy pill regularly.  Itching is present all over, including her scalp and even her eyeball. No new foods or medications aside from the sleeping medicine.    Patient Active Problem List   Diagnosis Date Noted   Primary osteoarthritis of right hip 09/27/2023   DDD (degenerative disc disease), cervical  02/28/2023   S/P lumbar fusion 01/30/2023   Arthralgia of both knees 12/21/2022   Stage 3a chronic kidney disease (HCC) 09/15/2022   OA (osteoarthritis) of hip 06/08/2022   Primary osteoarthritis of left hip 06/08/2022   Anxiety 04/07/2022   Palpitations 04/07/2022   Mild episode of recurrent major depressive disorder (HCC) 04/07/2022   Moderate persistent asthma without complication 02/16/2022   Intractable migraine with aura without status migrainosus 04/18/2020   Body mass index (BMI) 31.0-31.9, adult 02/06/2020   Chronic left hip pain 12/26/2019   Anemia 09/22/2017   S/P knee replacement 07/28/2017   Allergic rhinitis 05/26/2017   Vitamin D  deficiency 05/19/2017   Chronic nonintractable headache 05/18/2017   Advanced care planning/counseling discussion    Gastroesophageal reflux disease without esophagitis    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HCC) 03/02/2017   Insomnia 11/29/2016   Abdominal wall pain in right flank 03/29/2016   Diverticulosis 02/24/2016   Type 2 diabetes mellitus with microalbuminuria, without long-term current use of insulin  (HCC) 06/16/2015   Hyperlipidemia associated with type 2 diabetes mellitus (HCC) 06/16/2015   Chronic pain 04/13/2015   Neck pain 02/26/2015   Status post lumbar surgery 02/26/2015   Abnormal ECG 01/27/2015   Essential (primary) hypertension 01/27/2015    Social History   Tobacco Use   Smoking status: Former    Current packs/day: 0.00    Types: Cigarettes    Quit date: 2006    Years since quitting: 19.3   Smokeless tobacco: Never   Tobacco comments:    quit smoking  76yrs ago  Substance Use Topics   Alcohol use: No     Current Outpatient Medications:    albuterol  (VENTOLIN  HFA) 108 (90 Base) MCG/ACT inhaler, Inhale 2 puffs into the lungs every 6 (six) hours as needed for wheezing or shortness of breath., Disp: 18 g, Rfl: 2   Alcohol Swabs  (DROPSAFE ALCOHOL PREP) 70 % PADS, USE TO TEST BLOOD SUGAR TWO TIMES DAILY, Disp: 200 each, Rfl:  3   Blood Glucose Monitoring Suppl (TRUE METRIX AIR GLUCOSE METER) w/Device KIT, USE AS DIRECTED, Disp: 1 kit, Rfl: 3   Budeson-Glycopyrrol-Formoterol  (BREZTRI AEROSPHERE) 160-9-4.8 MCG/ACT AERO, Inhale 2 puffs into the lungs 2 (two) times daily., Disp: , Rfl:    busPIRone  (BUSPAR ) 5 MG tablet, TAKE 1 TABLET THREE TIMES DAILY AS NEEDED, Disp: 270 tablet, Rfl: 3   butalbital -acetaminophen -caffeine  (FIORICET) 50-325-40 MG tablet, Take 1 tablet by mouth every 6 (six) hours as needed for headache., Disp: 14 tablet, Rfl: 0   citalopram  (CELEXA ) 40 MG tablet, Take 1 tablet (40 mg total) by mouth daily., Disp: 90 tablet, Rfl: 0   Continuous Glucose Sensor (FREESTYLE LIBRE 3 SENSOR) MISC, 1 Device by Does not apply route every 14 (fourteen) days., Disp: 2 each, Rfl: 11   Dulaglutide  (TRULICITY ) 0.75 MG/0.5ML SOPN, Inject 0.75 mg into the skin once a week. Patient receives via [employer] Patient Assistance through Dec 2023 (Patient taking differently: Inject 0.75 mg into the skin every Friday at 6 PM. Patient receives via [employer] Patient Assistance through Dec 2023), Disp: 6 mL, Rfl: 1   esomeprazole  (NEXIUM ) 40 MG capsule, Take 1 capsule (40 mg total) by mouth at bedtime., Disp: 90 capsule, Rfl: 1   famotidine (PEPCID) 20 MG tablet, Take 1 tablet (20 mg total) by mouth 2 (two) times daily., Disp: 30 tablet, Rfl: 0   FARXIGA  5 MG TABS tablet, TAKE 1 TABLET BY MOUTH DAILY BEFORE BREAKFAST., Disp: 90 tablet, Rfl: 1   HYDROmorphone  (DILAUDID ) 2 MG tablet, Take 1-2 tablets (2-4 mg total) by mouth every 6 (six) hours as needed for severe pain (pain score 7-10) (severe breakthrough pain not responding to chronic oxycodone )., Disp: 28 tablet, Rfl: 0   hydrOXYzine  (VISTARIL ) 25 MG capsule, Take 1 capsule (25 mg total) by mouth every 8 (eight) hours as needed., Disp: 30 capsule, Rfl: 0   losartan  (COZAAR ) 25 MG tablet, Take 1 tablet (25 mg total) by mouth every evening., Disp: 90 tablet, Rfl: 1   meclizine   (ANTIVERT ) 25 MG tablet, TAKE 1 TABLET BY MOUTH 3 TIMES DAILY AS NEEDED FOR DIZZINESS., Disp: 90 tablet, Rfl: 0   methocarbamol  (ROBAXIN ) 500 MG tablet, Take 1 tablet (500 mg total) by mouth every 6 (six) hours as needed for muscle spasms., Disp: 40 tablet, Rfl: 0   montelukast  (SINGULAIR ) 10 MG tablet, Take 1 tablet (10 mg total) by mouth at bedtime., Disp: 90 tablet, Rfl: 1   ondansetron  (ZOFRAN ) 4 MG tablet, Take 1 tablet (4 mg total) by mouth every 6 (six) hours as needed for nausea., Disp: 20 tablet, Rfl: 0   ondansetron  (ZOFRAN -ODT) 4 MG disintegrating tablet, Take 1 tablet (4 mg total) by mouth every 8 (eight) hours as needed for nausea or vomiting., Disp: 20 tablet, Rfl: 0   oxyCODONE -acetaminophen  (PERCOCET) 10-325 MG tablet, Take 1 tablet by mouth every 6 (six) hours as needed for pain., Disp: , Rfl:    propranolol  (INDERAL ) 20 MG tablet, TAKE 1 TABLET BY MOUTH THREE TIMES A DAY, Disp: 270 tablet, Rfl: 1  rosuvastatin  (CRESTOR ) 40 MG tablet, Take 1 tablet (40 mg total) by mouth at bedtime., Disp: 90 tablet, Rfl: 0   topiramate  (TOPAMAX ) 25 MG tablet, Take 1 tablet (25 mg total) by mouth at bedtime., Disp: 90 tablet, Rfl: 1   triamcinolone  ointment (KENALOG ) 0.1 %, Apply 1 Application topically 2 (two) times daily. To affected areas, Disp: 60 g, Rfl: 0   TRUEplus Lancets 33G MISC, TEST BLOOD SUGAR TWO TIMES DAILY, Disp: 200 each, Rfl: 3  Allergies  Allergen Reactions   Metformin And Related Diarrhea   Adhesive [Tape] Rash and Other (See Comments)    Regular tape is ok, allergy is to paper tape    I personally reviewed active problem list, medication list, allergies with the patient/caregiver today.  ROS  Ten systems reviewed and is negative except as mentioned in HPI   Objective  Virtual encounter, vitals not obtained.  There is no height or weight on file to calculate BMI.  Nursing Note and Vital Signs reviewed.  Physical Exam  Awake, alert and oriented, speaking in  complete sentences   No results found for this or any previous visit (from the past 72 hours).  Assessment & Plan  Assessment and Plan Assessment & Plan Pruritus Chronic pruritus for one month, primarily affecting the scalp and forehead, with no new medications or foods identified as triggers. Current treatment with double antibiotic ointment is ineffective. Symptoms include internal itching sensation and darkening of the forehead skin, with significant discomfort but no visible rash. - Prescribe Kenalog  ointment to be applied twice daily. - Continue current allergy medication. - Introduce Pepcid to be taken twice daily to manage histamine release. - Advise monitoring of symptoms and report if no improvement.      -Red flags and when to present for emergency care or RTC including fever >101.25F, chest pain, shortness of breath, new/worsening/un-resolving symptoms,  reviewed with patient at time of visit. Follow up and care instructions discussed and provided in AVS. - I discussed the assessment and treatment plan with the patient. The patient was provided an opportunity to ask questions and all were answered. The patient agreed with the plan and demonstrated an understanding of the instructions.  I provided 15 minutes of non-face-to-face time during this encounter.  Quinton Buckler, [name]

## 2024-01-04 ENCOUNTER — Other Ambulatory Visit: Payer: Self-pay | Admitting: Nurse Practitioner

## 2024-01-04 DIAGNOSIS — R21 Rash and other nonspecific skin eruption: Secondary | ICD-10-CM

## 2024-01-05 NOTE — Telephone Encounter (Signed)
 Requested medication (s) are due for refill today: no  Requested medication (s) are on the active medication list: yes  Last refill:  12/28/23 #30  Future visit scheduled: no  Notes to clinic:  Pharmacy requesting 90 day fill   Requested Prescriptions  Pending Prescriptions Disp Refills   famotidine  (PEPCID ) 20 MG tablet [Pharmacy Med Name: FAMOTIDINE  20 MG TABLET] 180 tablet 1    Sig: TAKE 1 TABLET BY MOUTH TWICE A DAY     Gastroenterology:  H2 Antagonists Passed - 01/05/2024  1:49 PM      Passed - Valid encounter within last 12 months    Recent Outpatient Visits           1 week ago Rash   [facility] Quinton Buckler, [name]   2 weeks ago Essential (primary) hypertension   [facility] Quinton Buckler, [name]

## 2024-01-10 DIAGNOSIS — I10 Essential (primary) hypertension: Secondary | ICD-10-CM | POA: Diagnosis not present

## 2024-01-10 DIAGNOSIS — I209 Angina pectoris, unspecified: Secondary | ICD-10-CM | POA: Diagnosis not present

## 2024-01-10 DIAGNOSIS — F419 Anxiety disorder, unspecified: Secondary | ICD-10-CM | POA: Diagnosis not present

## 2024-01-10 DIAGNOSIS — E119 Type 2 diabetes mellitus without complications: Secondary | ICD-10-CM | POA: Diagnosis not present

## 2024-01-10 DIAGNOSIS — R0602 Shortness of breath: Secondary | ICD-10-CM | POA: Diagnosis not present

## 2024-01-10 DIAGNOSIS — R002 Palpitations: Secondary | ICD-10-CM | POA: Diagnosis not present

## 2024-01-10 DIAGNOSIS — E7800 Pure hypercholesterolemia, unspecified: Secondary | ICD-10-CM | POA: Diagnosis not present

## 2024-01-10 DIAGNOSIS — R9431 Abnormal electrocardiogram [ECG] [EKG]: Secondary | ICD-10-CM | POA: Diagnosis not present

## 2024-01-16 ENCOUNTER — Other Ambulatory Visit: Payer: Self-pay | Admitting: Nurse Practitioner

## 2024-01-16 DIAGNOSIS — R42 Dizziness and giddiness: Secondary | ICD-10-CM

## 2024-01-16 DIAGNOSIS — R21 Rash and other nonspecific skin eruption: Secondary | ICD-10-CM

## 2024-01-16 DIAGNOSIS — H60312 Diffuse otitis externa, left ear: Secondary | ICD-10-CM

## 2024-01-17 DIAGNOSIS — Z96641 Presence of right artificial hip joint: Secondary | ICD-10-CM | POA: Diagnosis not present

## 2024-01-17 DIAGNOSIS — M545 Low back pain, unspecified: Secondary | ICD-10-CM | POA: Diagnosis not present

## 2024-01-17 DIAGNOSIS — M25551 Pain in right hip: Secondary | ICD-10-CM | POA: Diagnosis not present

## 2024-01-18 NOTE — Telephone Encounter (Signed)
 Requested Prescriptions  Pending Prescriptions Disp Refills   triamcinolone  ointment (KENALOG ) 0.1 % [Pharmacy Med Name: TRIAMCINOLONE  0.1% OINTMENT] 60 g 0    Sig: APPLY 1 APPLICATION TOPICALLY 2 (TWO) TIMES DAILY. TO AFFECTED AREAS     Not Delegated - Dermatology:  Corticosteroids Failed - 01/18/2024  4:35 PM      Failed - This refill cannot be delegated      Passed - Valid encounter within last 12 months    Recent Outpatient Visits           3 weeks ago Rash   [facility] Quinton Buckler, [name]   1 month ago Essential (primary) hypertension   [facility] Donny Gall F, [name]               ofloxacin  (FLOXIN ) 0.3 % OTIC solution [Pharmacy Med Name: OFLOXACIN  0.3% EAR DROPS] 5 mL 0    Sig: PLACE 5 DROPS INTO THE LEFT EAR DAILY.     Off-Protocol Failed - 01/18/2024  4:35 PM      Failed - Medication not assigned to a protocol, review manually.      Passed - Valid encounter within last 12 months    Recent Outpatient Visits           3 weeks ago Rash   [facility] Quinton Buckler, [name]   1 month ago Essential (primary) hypertension   [facility] Donny Gall F, [name]               famotidine  (PEPCID ) 20 MG tablet [Pharmacy Med Name: FAMOTIDINE  20 MG TABLET] 30 tablet 0    Sig: TAKE 1 TABLET BY MOUTH TWICE A DAY     Gastroenterology:  H2 Antagonists Passed - 01/18/2024  4:35 PM      Passed - Valid encounter within last 12 months    Recent Outpatient Visits           3 weeks ago Rash   [facility] Quinton Buckler, [name]   1 month ago Essential (primary) hypertension   [facility] Quinton Buckler, [name]               meclizine  (ANTIVERT ) 25 MG tablet [Pharmacy Med Name: MECLIZINE  25 MG TABLET] 90 tablet 0    Sig: TAKE 1 TABLET BY MOUTH 3 TIMES A DAY AS NEEDED FOR DIZZINESS     Not Delegated -  Gastroenterology: Antiemetics Failed - 01/18/2024  4:35 PM      Failed - This refill cannot be delegated      Passed - Valid encounter within last 6 months    Recent Outpatient Visits           3 weeks ago Rash   [facility] Quinton Buckler, [name]   1 month ago Essential (primary) hypertension   [facility] Quinton Buckler, [name]

## 2024-01-18 NOTE — Telephone Encounter (Signed)
 Requested medication (s) are due for refill today: yes  Requested medication (s) are on the active medication list: yes  Last refill:    Future visit scheduled: yes  Notes to clinic:  Unable to refill per protocol, cannot delegate.      Requested Prescriptions  Pending Prescriptions Disp Refills   triamcinolone  ointment (KENALOG ) 0.1 % [Pharmacy Med Name: TRIAMCINOLONE  0.1% OINTMENT] 60 g 0    Sig: APPLY 1 APPLICATION TOPICALLY 2 (TWO) TIMES DAILY. TO AFFECTED AREAS     Not Delegated - Dermatology:  Corticosteroids Failed - 01/18/2024  4:35 PM      Failed - This refill cannot be delegated      Passed - Valid encounter within last 12 months    Recent Outpatient Visits           3 weeks ago Rash   [facility] Quinton Buckler, [name]   1 month ago Essential (primary) hypertension   [facility] Donny Gall F, [name]               ofloxacin  (FLOXIN ) 0.3 % OTIC solution [Pharmacy Med Name: OFLOXACIN  0.3% EAR DROPS] 5 mL 0    Sig: PLACE 5 DROPS INTO THE LEFT EAR DAILY.     Off-Protocol Failed - 01/18/2024  4:35 PM      Failed - Medication not assigned to a protocol, review manually.      Passed - Valid encounter within last 12 months    Recent Outpatient Visits           3 weeks ago Rash   [facility] Quinton Buckler, [name]   1 month ago Essential (primary) hypertension   [facility] Donny Gall F, [name]               meclizine  (ANTIVERT ) 25 MG tablet [Pharmacy Med Name: MECLIZINE  25 MG TABLET] 90 tablet 0    Sig: TAKE 1 TABLET BY MOUTH 3 TIMES A DAY AS NEEDED FOR DIZZINESS     Not Delegated - Gastroenterology: Antiemetics Failed - 01/18/2024  4:35 PM      Failed - This refill cannot be delegated      Passed - Valid encounter within last 6 months    Recent Outpatient Visits           3 weeks ago Rash   [facility] Quinton Buckler,  [name]   1 month ago Essential (primary) hypertension   [facility] Donny Gall F, [name]              Signed Prescriptions Disp Refills   famotidine  (PEPCID ) 20 MG tablet 180 tablet 0    Sig: TAKE 1 TABLET BY MOUTH TWICE A DAY     Gastroenterology:  H2 Antagonists Passed - 01/18/2024  4:35 PM      Passed - Valid encounter within last 12 months    Recent Outpatient Visits           3 weeks ago Rash   [facility] Quinton Buckler, [name]   1 month ago Essential (primary) hypertension   [facility] Quinton Buckler, [name]

## 2024-01-21 ENCOUNTER — Other Ambulatory Visit: Payer: Self-pay | Admitting: Nurse Practitioner

## 2024-01-21 DIAGNOSIS — K219 Gastro-esophageal reflux disease without esophagitis: Secondary | ICD-10-CM

## 2024-01-21 DIAGNOSIS — E7800 Pure hypercholesterolemia, unspecified: Secondary | ICD-10-CM

## 2024-01-22 DIAGNOSIS — S93401A Sprain of unspecified ligament of right ankle, initial encounter: Secondary | ICD-10-CM | POA: Diagnosis not present

## 2024-01-23 NOTE — Telephone Encounter (Signed)
 Requested Prescriptions  Pending Prescriptions Disp Refills   rosuvastatin  (CRESTOR ) 40 MG tablet [Pharmacy Med Name: Rosuvastatin  Calcium  Oral Tablet 40 MG] 90 tablet 3    Sig: TAKE 1 TABLET AT BEDTIME     Cardiovascular:  Antilipid - Statins 2 Failed - 01/23/2024  9:06 AM      Failed - Cr in normal range and within 360 days    Creat  Date Value Ref Range Status  12/19/2023 1.54 (H) 0.60 - 1.00 mg/dL Final   Creatinine, Urine  Date Value Ref Range Status  12/19/2023 95 20 - 275 mg/dL Final         Failed - Lipid Panel in normal range within the last 12 months    Cholesterol  Date Value Ref Range Status  12/19/2023 163 <200 mg/dL Final   LDL Cholesterol (Calc)  Date Value Ref Range Status  12/19/2023 79 mg/dL (calc) Final    Comment:    Reference range: <100 . Desirable range <100 mg/dL for primary prevention;   <70 mg/dL for patients with CHD or diabetic patients  with > or = 2 CHD risk factors. Aaron Aas LDL-C is now calculated using the Martin-Hopkins  calculation, which is a validated novel method providing  better accuracy than the Friedewald equation in the  estimation of LDL-C.  Melinda Sprawls et al. Erroll Heard. 1191;478(29): 2061-2068  (http://education.[employer].com/faq/FAQ164)    HDL  Date Value Ref Range Status  12/19/2023 70 > OR = 50 mg/dL Final   Triglycerides  Date Value Ref Range Status  12/19/2023 67 <150 mg/dL Final         Passed - Patient is not pregnant      Passed - Valid encounter within last 12 months    Recent Outpatient Visits           3 weeks ago [facility]   [facility] Donny Gall F, [name]   1 month ago Essential (primary) hypertension   [facility] Quinton Buckler, [name]               esomeprazole  (NEXIUM ) 40 MG capsule [Pharmacy Med Name: Esomeprazole  Magnesium  Oral Capsule Delayed Release 40 MG] 90 capsule 3    Sig: TAKE 1 CAPSULE AT BEDTIME     Gastroenterology: Proton Pump  Inhibitors 2 Passed - 01/23/2024  9:06 AM      Passed - ALT in normal range and within 360 days    ALT  Date Value Ref Range Status  12/19/2023 12 6 - 29 U/L Final   SGPT (ALT)  Date Value Ref Range Status  11/26/2013 26 12 - 78 U/L Final         Passed - AST in normal range and within 360 days    AST  Date Value Ref Range Status  12/19/2023 19 10 - 35 U/L Final   SGOT(AST)  Date Value Ref Range Status  11/26/2013 15 15 - 37 Unit/L Final         Passed - Valid encounter within last 12 months    Recent Outpatient Visits           3 weeks ago Rash   [facility] Quinton Buckler, [name]   1 month ago Essential (primary) hypertension   [facility] Quinton Buckler, [name]

## 2024-01-24 DIAGNOSIS — Z4889 Encounter for other specified surgical aftercare: Secondary | ICD-10-CM | POA: Diagnosis not present

## 2024-01-24 DIAGNOSIS — Z4542 Encounter for adjustment and management of neuropacemaker (brain) (peripheral nerve) (spinal cord): Secondary | ICD-10-CM | POA: Diagnosis not present

## 2024-01-31 ENCOUNTER — Other Ambulatory Visit: Payer: Self-pay | Admitting: Nurse Practitioner

## 2024-01-31 DIAGNOSIS — F419 Anxiety disorder, unspecified: Secondary | ICD-10-CM

## 2024-02-01 NOTE — Telephone Encounter (Signed)
 Requested Prescriptions  Pending Prescriptions Disp Refills   citalopram  (CELEXA ) 40 MG tablet [Pharmacy Med Name: Citalopram  Hydrobromide Oral Tablet 40 MG] 90 tablet 1    Sig: TAKE 1 TABLET EVERY DAY     Psychiatry:  Antidepressants - SSRI Passed - 02/01/2024  5:25 PM      Passed - Completed PHQ-2 or PHQ-9 in the last 360 days      Passed - Valid encounter within last 6 months    Recent Outpatient Visits           1 month ago Rash   [facility] Quinton Buckler, [name]   1 month ago Essential (primary) hypertension   [facility] Quinton Buckler, [name]

## 2024-02-05 DIAGNOSIS — H2513 Age-related nuclear cataract, bilateral: Secondary | ICD-10-CM | POA: Diagnosis not present

## 2024-02-05 DIAGNOSIS — H353132 Nonexudative age-related macular degeneration, bilateral, intermediate dry stage: Secondary | ICD-10-CM | POA: Diagnosis not present

## 2024-02-05 DIAGNOSIS — H04123 Dry eye syndrome of bilateral lacrimal glands: Secondary | ICD-10-CM | POA: Diagnosis not present

## 2024-02-05 DIAGNOSIS — E119 Type 2 diabetes mellitus without complications: Secondary | ICD-10-CM | POA: Diagnosis not present

## 2024-02-12 ENCOUNTER — Other Ambulatory Visit: Payer: Self-pay | Admitting: Nephrology

## 2024-02-12 DIAGNOSIS — D631 Anemia in chronic kidney disease: Secondary | ICD-10-CM | POA: Diagnosis not present

## 2024-02-12 DIAGNOSIS — N1832 Chronic kidney disease, stage 3b: Secondary | ICD-10-CM

## 2024-02-12 DIAGNOSIS — E1122 Type 2 diabetes mellitus with diabetic chronic kidney disease: Secondary | ICD-10-CM

## 2024-02-12 DIAGNOSIS — I10 Essential (primary) hypertension: Secondary | ICD-10-CM | POA: Diagnosis not present

## 2024-02-13 ENCOUNTER — Telehealth: Payer: Self-pay | Admitting: Nurse Practitioner

## 2024-02-13 NOTE — Telephone Encounter (Signed)
 Pt.notified

## 2024-02-13 NOTE — Telephone Encounter (Signed)
 Cough/sneezing/headache started last night. Pt tried to sch but everyone is booked. Wanted to know if you would call something in to [facility]

## 2024-02-13 NOTE — Telephone Encounter (Signed)
 Can we try for same day appt for tomorrow? They will not call her in anything without being seen and typically they do not call antibiotics in until at least a week sick.

## 2024-02-16 ENCOUNTER — Ambulatory Visit: Admission: RE | Admit: 2024-02-16 | Source: Ambulatory Visit

## 2024-02-19 DIAGNOSIS — H2511 Age-related nuclear cataract, right eye: Secondary | ICD-10-CM | POA: Diagnosis not present

## 2024-02-20 DIAGNOSIS — G894 Chronic pain syndrome: Secondary | ICD-10-CM | POA: Diagnosis not present

## 2024-02-20 DIAGNOSIS — M5459 Other low back pain: Secondary | ICD-10-CM | POA: Diagnosis not present

## 2024-02-21 ENCOUNTER — Encounter: Payer: Self-pay | Admitting: Ophthalmology

## 2024-02-21 ENCOUNTER — Other Ambulatory Visit: Payer: Self-pay

## 2024-02-21 DIAGNOSIS — T85193A Other mechanical complication of implanted electronic neurostimulator, generator, initial encounter: Secondary | ICD-10-CM | POA: Diagnosis not present

## 2024-02-21 DIAGNOSIS — Z4542 Encounter for adjustment and management of neuropacemaker (brain) (peripheral nerve) (spinal cord): Secondary | ICD-10-CM | POA: Diagnosis not present

## 2024-02-22 ENCOUNTER — Encounter: Payer: Self-pay | Admitting: Ophthalmology

## 2024-02-22 NOTE — Anesthesia Preprocedure Evaluation (Addendum)
 Anesthesia Evaluation  Patient identified by MRN, date of birth, ID band Patient awake    Reviewed: Allergy & Precautions, H&P , NPO status , Patient's Chart, lab work & pertinent test results, reviewed documented beta blocker date and time   Airway Mallampati: II  TM Distance: >3 FB Neck ROM: full    Dental no notable dental hx. (+) Teeth Intact   Pulmonary shortness of breath and with exertion, asthma , former smoker   Pulmonary exam normal breath sounds clear to auscultation       Cardiovascular Exercise Tolerance: Poor hypertension, On Medications negative cardio ROS  Rhythm:regular Rate:Normal     Neuro/Psych  Headaches PSYCHIATRIC DISORDERS Anxiety Depression       GI/Hepatic Neg liver ROS,GERD  Medicated,,  Endo/Other  negative endocrine ROSdiabetes, Well Controlled    Renal/GU Renal disease     Musculoskeletal   Abdominal   Peds  Hematology  (+) Blood dyscrasia, anemia   Anesthesia Other Findings Arthritis  Vitamin D  deficiency Seasonal allergies  History of migraine Weakness  Low back pain History of colon polyps  Nocturia History of shingles  GERD (gastroesophageal reflux disease) Essential hypertension, benign Hyperlipidemia Cataract  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HCC) Wears dentures  Headache S/P insertion of spinal cord stimulato  Asthma Type II or unspecified type diabetes mellitus without mention of complication, not stated as uncontrolled  Anxiety dyspnea  Reproductive/Obstetrics negative OB ROS                              Anesthesia Physical Anesthesia Plan  ASA: 3  Anesthesia Plan: MAC   Post-op Pain Management:    Induction:   PONV Risk Score and Plan:   Airway Management Planned:   Additional Equipment:   Intra-op Plan:   Post-operative Plan:   Informed Consent: I have reviewed the patients History and Physical, chart, labs and discussed the  procedure including the risks, benefits and alternatives for the proposed anesthesia with the patient or authorized representative who has indicated his/her understanding and acceptance.       Plan Discussed with: CRNA  Anesthesia Plan Comments:         Anesthesia Quick Evaluation

## 2024-02-26 ENCOUNTER — Ambulatory Visit
Admission: RE | Admit: 2024-02-26 | Discharge: 2024-02-26 | Disposition: A | Discharge status: Home / Self Care | Source: Ambulatory Visit | Attending: Nephrology | Admitting: Nephrology

## 2024-02-26 DIAGNOSIS — E1122 Type 2 diabetes mellitus with diabetic chronic kidney disease: Secondary | ICD-10-CM | POA: Diagnosis not present

## 2024-02-26 DIAGNOSIS — N1832 Chronic kidney disease, stage 3b: Secondary | ICD-10-CM | POA: Insufficient documentation

## 2024-02-29 DIAGNOSIS — M25571 Pain in right ankle and joints of right foot: Secondary | ICD-10-CM | POA: Diagnosis not present

## 2024-03-01 NOTE — Discharge Instructions (Signed)

## 2024-03-06 ENCOUNTER — Encounter
Admission: RE | Disposition: A | Discharge status: Home / Self Care | Payer: Self-pay | Source: Home / Self Care | Attending: Ophthalmology

## 2024-03-06 ENCOUNTER — Other Ambulatory Visit: Payer: Self-pay

## 2024-03-06 ENCOUNTER — Encounter: Payer: Self-pay | Admitting: Ophthalmology

## 2024-03-06 ENCOUNTER — Encounter: Payer: Self-pay | Admitting: Anesthesiology

## 2024-03-06 ENCOUNTER — Ambulatory Visit
Admission: RE | Admit: 2024-03-06 | Discharge: 2024-03-06 | Disposition: A | Discharge status: Home / Self Care | Attending: Ophthalmology | Admitting: Ophthalmology

## 2024-03-06 DIAGNOSIS — Z7984 Long term (current) use of oral hypoglycemic drugs: Secondary | ICD-10-CM | POA: Insufficient documentation

## 2024-03-06 DIAGNOSIS — E1122 Type 2 diabetes mellitus with diabetic chronic kidney disease: Secondary | ICD-10-CM | POA: Insufficient documentation

## 2024-03-06 DIAGNOSIS — N189 Chronic kidney disease, unspecified: Secondary | ICD-10-CM | POA: Diagnosis not present

## 2024-03-06 DIAGNOSIS — I129 Hypertensive chronic kidney disease with stage 1 through stage 4 chronic kidney disease, or unspecified chronic kidney disease: Secondary | ICD-10-CM | POA: Insufficient documentation

## 2024-03-06 DIAGNOSIS — E1136 Type 2 diabetes mellitus with diabetic cataract: Secondary | ICD-10-CM | POA: Diagnosis not present

## 2024-03-06 DIAGNOSIS — Z87891 Personal history of nicotine dependence: Secondary | ICD-10-CM | POA: Diagnosis not present

## 2024-03-06 DIAGNOSIS — E559 Vitamin D deficiency, unspecified: Secondary | ICD-10-CM | POA: Insufficient documentation

## 2024-03-06 DIAGNOSIS — E785 Hyperlipidemia, unspecified: Secondary | ICD-10-CM | POA: Insufficient documentation

## 2024-03-06 DIAGNOSIS — D631 Anemia in chronic kidney disease: Secondary | ICD-10-CM | POA: Diagnosis not present

## 2024-03-06 DIAGNOSIS — J45909 Unspecified asthma, uncomplicated: Secondary | ICD-10-CM | POA: Insufficient documentation

## 2024-03-06 DIAGNOSIS — H2511 Age-related nuclear cataract, right eye: Secondary | ICD-10-CM | POA: Insufficient documentation

## 2024-03-06 HISTORY — DX: Chronic kidney disease, unspecified: N18.9

## 2024-03-06 HISTORY — DX: Personal history of diseases of the blood and blood-forming organs and certain disorders involving the immune mechanism: Z86.2

## 2024-03-06 HISTORY — PX: CATARACT EXTRACTION W/PHACO: SHX586

## 2024-03-06 LAB — GLUCOSE, CAPILLARY: Glucose-Capillary: 135 mg/dL — ABNORMAL HIGH (ref 70–99)

## 2024-03-06 SURGERY — PHACOEMULSIFICATION, CATARACT, WITH IOL INSERTION
Anesthesia: Monitor Anesthesia Care | Site: Eye | Laterality: Right

## 2024-03-06 MED ORDER — ACETAMINOPHEN 10 MG/ML IV SOLN: 1000.0000 mg | Freq: Once | INTRAVENOUS | Status: DC | PRN

## 2024-03-06 MED ORDER — SIGHTPATH DOSE#1 BSS IO SOLN: INTRAOCULAR | Status: DC | PRN

## 2024-03-06 MED ORDER — FENTANYL CITRATE (PF) 100 MCG/2ML IJ SOLN
INTRAMUSCULAR | Status: AC
Start: 2024-03-06 — End: 2024-03-06
  Filled 2024-03-06: qty 2

## 2024-03-06 MED ORDER — MOXIFLOXACIN HCL 0.5 % OP SOLN
OPHTHALMIC | Status: DC | PRN
  Administered 2024-03-06: .2 mL via OPHTHALMIC

## 2024-03-06 MED ORDER — TETRACAINE HCL 0.5 % OP SOLN
1.0000 [drp] | OPHTHALMIC | Status: DC | PRN
  Administered 2024-03-06 (×3): 1 [drp] via OPHTHALMIC

## 2024-03-06 MED ORDER — BRIMONIDINE TARTRATE-TIMOLOL 0.2-0.5 % OP SOLN
OPHTHALMIC | Status: DC | PRN
  Administered 2024-03-06: 1 [drp] via OPHTHALMIC

## 2024-03-06 MED ORDER — MIDAZOLAM HCL 2 MG/2ML IJ SOLN
INTRAMUSCULAR | Status: AC
  Filled 2024-03-06: qty 2

## 2024-03-06 MED ORDER — FENTANYL CITRATE (PF) 100 MCG/2ML IJ SOLN
INTRAMUSCULAR | Status: DC | PRN
  Administered 2024-03-06: 50 ug via INTRAVENOUS

## 2024-03-06 MED ORDER — DROPERIDOL 2.5 MG/ML IJ SOLN: 0.6250 mg | Freq: Once | INTRAMUSCULAR | Status: DC | PRN

## 2024-03-06 MED ORDER — ARMC OPHTHALMIC DILATING DROPS
1.0000 | OPHTHALMIC | Status: DC | PRN
  Administered 2024-03-06 (×3): 1 via OPHTHALMIC

## 2024-03-06 MED ORDER — CEFUROXIME OPHTHALMIC INJECTION 1 MG/0.1 ML
INJECTION | OPHTHALMIC | Status: DC | PRN
  Administered 2024-03-06: 1 mg via INTRACAMERAL

## 2024-03-06 MED ORDER — FENTANYL CITRATE (PF) 100 MCG/2ML IJ SOLN: 25.0000 ug | INTRAMUSCULAR | Status: DC | PRN

## 2024-03-06 MED ORDER — MIDAZOLAM HCL 2 MG/2ML IJ SOLN
INTRAMUSCULAR | Status: DC | PRN
Start: 2024-03-06 — End: 2024-03-06
  Administered 2024-03-06: 2 mg via INTRAVENOUS

## 2024-03-06 MED ORDER — SIGHTPATH DOSE#1 BSS IO SOLN
INTRAOCULAR | Status: DC | PRN
  Administered 2024-03-06: 15 mL via INTRAOCULAR

## 2024-03-06 MED ORDER — LACTATED RINGERS IV SOLN: INTRAVENOUS | Status: DC

## 2024-03-06 MED ORDER — OXYCODONE HCL 5 MG PO TABS: 5.0000 mg | ORAL_TABLET | Freq: Once | ORAL | Status: DC | PRN

## 2024-03-06 MED ORDER — OXYCODONE HCL 5 MG/5ML PO SOLN: 5.0000 mg | Freq: Once | ORAL | Status: DC | PRN

## 2024-03-06 MED ORDER — LIDOCAINE HCL (PF) 2 % IJ SOLN
INTRAOCULAR | Status: DC | PRN
  Administered 2024-03-06: 2 mL

## 2024-03-06 MED ORDER — SIGHTPATH DOSE#1 NA HYALUR & NA CHOND-NA HYALUR IO KIT
PACK | INTRAOCULAR | Status: DC | PRN
  Administered 2024-03-06: 1 via OPHTHALMIC

## 2024-03-06 SURGICAL SUPPLY — 9 items
CATARACT SUITE SIGHTPATH (MISCELLANEOUS) ×1 IMPLANT
FEE CATARACT SUITE SIGHTPATH (MISCELLANEOUS) ×1 IMPLANT
GLOVE BIOGEL PI IND STRL 8 (GLOVE) ×1 IMPLANT
GLOVE SURG LX STRL 7.5 STRW (GLOVE) ×1 IMPLANT
GLOVE SURG SYN 6.5 PF PI BL (GLOVE) ×1 IMPLANT
LENS IOL TECNIS EYHANCE 20.0 (Intraocular Lens) IMPLANT
NDL FILTER BLUNT 18X1 1/2 (NEEDLE) ×1 IMPLANT
NEEDLE FILTER BLUNT 18X1 1/2 (NEEDLE) ×1 IMPLANT
SYR 3ML LL SCALE MARK (SYRINGE) ×1 IMPLANT

## 2024-03-06 NOTE — Transfer of Care (Signed)
 Immediate Anesthesia Transfer of Care Note  Patient: Lori Potts  Procedure(s) Performed: PHACOEMULSIFICATION, CATARACT, WITH IOL INSERTION 6.60 00:44.6 (Right: Eye)  Patient Location: PACU  Anesthesia Type:MAC  Level of Consciousness: awake and alert   Airway & Oxygen Therapy: Patient Spontanous Breathing and Patient connected to nasal cannula oxygen  Post-op Assessment: Report given to RN and Post -op Vital signs reviewed and stable  Post vital signs: Reviewed  Last Vitals:  Vitals Value Taken Time  BP    Temp    Pulse    Resp    SpO2      Last Pain:  Vitals:   03/06/24 0909  TempSrc: Temporal  PainSc: 0-No pain         Complications: No notable events documented.

## 2024-03-06 NOTE — Anesthesia Postprocedure Evaluation (Signed)
 Anesthesia Post Note  Patient: Ronal DEL Ard  Procedure(s) Performed: PHACOEMULSIFICATION, CATARACT, WITH IOL INSERTION 6.60 00:44.6 (Right: Eye)  Patient location during evaluation: PACU Anesthesia Type: MAC Level of consciousness: awake and alert Pain management: pain level controlled Vital Signs Assessment: post-procedure vital signs reviewed and stable Respiratory status: spontaneous breathing, nonlabored ventilation, respiratory function stable and patient connected to nasal cannula oxygen Cardiovascular status: blood pressure returned to baseline and stable Postop Assessment: no apparent nausea or vomiting Anesthetic complications: no   No notable events documented.   Last Vitals:  Vitals:   03/06/24 0909 03/06/24 1012  BP: (!) 153/81 (!) 157/71  Pulse: 71 61  Resp: 16 15  Temp: 36.4 C (!) 35.7 C  SpO2: 100% 99%    Last Pain:  Vitals:   03/06/24 1012  TempSrc:   PainSc: 0-No pain                 Lynwood KANDICE Clause

## 2024-03-06 NOTE — Op Note (Signed)
 LOCATION:  [facility]   PREOPERATIVE DIAGNOSIS:    Nuclear sclerotic cataract right eye. H25.11   POSTOPERATIVE DIAGNOSIS:  Nuclear sclerotic cataract right eye.     PROCEDURE:  Phacoemusification with posterior chamber intraocular lens placement of the right eye   ULTRASOUND TIME: Procedure(s): PHACOEMULSIFICATION, CATARACT, WITH IOL INSERTION 6.60 00:44.6 (Right)  LENS:   Implant Name Type Inv. Item Serial No. Manufacturer Lot No. LRB No. Used Action  LENS IOL TECNIS EYHANCE 20.0 - [id number] Intraocular Lens LENS IOL TECNIS EYHANCE 20.0 [id number] [employer]  Right 1 Implanted         SURGEON:  Dene FABIENE Etienne, MD   ANESTHESIA:  Topical with tetracaine  drops and 2% Xylocaine  jelly, augmented with 1% preservative-free intracameral lidocaine .    COMPLICATIONS:  None.   DESCRIPTION OF PROCEDURE:  The patient was identified in the holding room and transported to the operating room and placed in the supine position under the operating microscope.  The right eye was identified as the operative eye and it was prepped and draped in the usual sterile ophthalmic fashion.   A 1 millimeter clear-corneal paracentesis was made at the 12:00 position.  0.5 ml of preservative-free 1% lidocaine  was injected into the anterior chamber. The anterior chamber was filled with Viscoat viscoelastic.  A 2.4 millimeter keratome was used to make a near-clear corneal incision at the 9:00 position.  A curvilinear capsulorrhexis was made with a cystotome and capsulorrhexis forceps.  Balanced salt solution was used to hydrodissect and hydrodelineate the nucleus.   Phacoemulsification was then used in stop and chop fashion to remove the lens nucleus and epinucleus.  The remaining cortex was then removed using the irrigation and aspiration handpiece. Provisc was then placed into the capsular bag to distend it for lens placement.  A lens was then injected into the capsular bag.  The remaining  viscoelastic was aspirated.   Wounds were hydrated with balanced salt solution.  The anterior chamber was inflated to a physiologic pressure with balanced salt solution.  No wound leaks were noted. Cefuroxime  0.1 ml of a 10mg /ml solution was injected into the anterior chamber for a dose of 1 mg of intracameral antibiotic at the completion of the case.   Timolol  and Brimonidine  drops were applied to the eye.  The patient was taken to the recovery room in stable condition without complications of anesthesia or surgery.   Harbor Vanover 03/06/2024, 10:10 AM

## 2024-03-06 NOTE — H&P (Signed)
 [facility]   Primary Care Physician:  Gareth Mliss FALCON, [name] Ophthalmologist: Dr. Dene Etienne  Pre-Procedure History & Physical: HPI:  Lori Potts is a 78 y.o. female here for ophthalmic surgery.   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   Cataract    right eye but immature   Dyspnea    Essential hypertension, benign    takes Lisinopril -HCTZ daily   GERD (gastroesophageal reflux disease)    Headache    sinus   History of anemia due to chronic kidney disease    History of colon polyps    benign   History of migraine    History of shingles    Hyperlipidemia    takes Lipitor daily   Low back pain    Nocturia    S/P insertion of spinal cord stimulator    Seasonal allergies    takes Singulair  daily as needed   Type II or unspecified type diabetes mellitus without mention of complication, not stated as uncontrolled    Vitamin D  deficiency    takes Vit D weekly   Weakness    numbness and tingling left arm   Wears dentures    full upper and Potts    Past Surgical History:  Procedure Laterality Date   ABDOMINAL HYSTERECTOMY     ANTERIOR CERVICAL DECOMP/DISCECTOMY FUSION N/A 02/26/2015   Procedure: ANTERIOR CERVICAL DISCECTOMY FUSION C4-5 (1 LEVEL);  Surgeon: Donaciano Sprang, MD;  Location: [facility] OR;  Service: Orthopedics;  Laterality: N/A;   ANTERIOR LAT LUMBAR FUSION N/A 03/21/2013   Procedure: ANTERIOR LATERAL LUMBAR FUSION 1 LEVEL/ XLIF L3-L4 ;  Surgeon: Donaciano Sprang, MD;  Location: [facility] OR;  Service: Orthopedics;  Laterality: N/A;   ANTERIOR LATERAL LUMBAR FUSION WITH PERCUTANEOUS SCREW 1 LEVEL N/A 01/30/2023   Procedure: LATERAL INTERBODY FUSION LUMBAR TWO TO THREE;  Surgeon: Sprang Donaciano, MD;  Location: [facility] OR;  Service: Orthopedics;  Laterality: N/A;  5 HRS 3 C-BED LEFT TAP BLOCK WITH EXPAREL    APPENDECTOMY     AUGMENTATION MAMMAPLASTY Bilateral 1978   BACK SURGERY     BACK  SURGERY     Lumbar fusion x 2   BREAST EXCISIONAL BIOPSY Right 1970   BREAST SURGERY  1990   Augementation   CARDIAC CATHETERIZATION  5/12   ef 55%   CARDIAC CATHETERIZATION  10/2010   [facility]; EF 55%   CARDIAC CATHETERIZATION Left 02/16/2016   Procedure: Left Heart Cath and Coronary Angiography;  Surgeon: Cara JONETTA Lovelace, MD;  Location: [facility] INVASIVE [facility] LAB;  Service: Cardiovascular;  Laterality: Left;   COLONOSCOPY     COLONOSCOPY WITH PROPOFOL  N/A 04/10/2017   Procedure: COLONOSCOPY WITH PROPOFOL ;  Surgeon: Jinny Carmine, MD;  Location: [facility];  Service: Gastroenterology;  Laterality: N/A;  Diabetic - insulin    ESOPHAGOGASTRODUODENOSCOPY     JOINT REPLACEMENT     KIDNEY SURGERY  1998   growth removed from left kidney    LEFT HEART CATH AND CORONARY ANGIOGRAPHY Left 09/10/2018   Procedure: LEFT HEART CATH AND CORONARY ANGIOGRAPHY;  Surgeon: Lovelace Cara JONETTA, MD;  Location: [facility];  Service: Cardiovascular;  Laterality: Left;   pain stimulator     POSTERIOR CERVICAL FUSION/FORAMINOTOMY Right 03/21/2013   Procedure: POSTERIOR L2-3 RIGHT FORAMINOTOMY;  Surgeon: Donaciano Sprang, MD;  Location: [facility];  Service: Orthopedics;  Laterality: Right;   REVISION OF SCAR TISSUE  RECTUS MUSCLE     SHOULDER ARTHROSCOPY WITH ROTATOR CUFF REPAIR AND SUBACROMIAL DECOMPRESSION Right 09/17/2020   Procedure: RIGHT SHOULDER ARTHROSCOPY WITH MINI-OPEN ROTATOR CUFF REPAIR, DISTAL CLAVICLE EXCISION, AND SUBACROMIAL DECOMPRESSION WITH BICEP TENDONESIS;  Surgeon: Marchia Drivers, MD;  Location: [facility] ORS;  Service: Orthopedics;  Laterality: Right;   SMALL BOWEL REPAIR     SPINAL CORD STIMULATOR BATTERY EXCHANGE N/A 10/17/2012   Procedure: SPINAL CORD STIMULATOR BATTERY REMOVAL;  Surgeon: Donaciano Sprang, MD;  Location: [facility] OR;  Service: Orthopedics;  Laterality: N/A;   SPINAL CORD STIMULATOR BATTERY EXCHANGE N/A 07/22/2015   Procedure: REIMPLANTATION OF SPINAL CORD STIMULATOR BATTERY ;  Surgeon:  Donaciano Sprang, MD;  Location: [facility] OR;  Service: Orthopedics;  Laterality: N/A;   TOTAL HIP ARTHROPLASTY Left 06/08/2022   Procedure: TOTAL HIP ARTHROPLASTY ANTERIOR APPROACH;  Surgeon: Melodi Lerner, MD;  Location: [facility];  Service: Orthopedics;  Laterality: Left;   TOTAL HIP ARTHROPLASTY Right 09/27/2023   Procedure: TOTAL HIP ARTHROPLASTY ANTERIOR APPROACH;  Surgeon: Melodi Lerner, MD;  Location: [facility];  Service: Orthopedics;  Laterality: Right;   TOTAL KNEE ARTHROPLASTY Left    TOTAL KNEE ARTHROPLASTY Right 07/28/2017   Procedure: RIGHT TOTAL KNEE ARTHROPLASTY;  Surgeon: Gerome Charleston, MD;  Location: [facility];  Service: Orthopedics;  Laterality: Right;    Prior to Admission medications   Medication Sig Start Date End Date Taking? Authorizing Provider  albuterol  (VENTOLIN  HFA) 108 (90 Base) MCG/ACT inhaler Inhale 2 puffs into the lungs every 6 (six) hours as needed for wheezing or shortness of breath. 09/06/22  Yes Gareth Mliss FALCON, [name]  Alcohol Swabs  (DROPSAFE ALCOHOL PREP) 70 % PADS USE TO TEST BLOOD SUGAR TWO TIMES DAILY 11/08/23  Yes Pender, Julie F, [name]  Blood Glucose Monitoring Suppl (TRUE METRIX AIR GLUCOSE METER) w/Device KIT USE AS DIRECTED 11/08/23  Yes Gareth Mliss FALCON, [name]  Budeson-Glycopyrrol-Formoterol  (BREZTRI AEROSPHERE) 160-9-4.8 MCG/ACT AERO Inhale 2 puffs into the lungs 2 (two) times daily.   Yes [provider]  busPIRone  (BUSPAR ) 5 MG tablet TAKE 1 TABLET THREE TIMES DAILY AS NEEDED 05/11/23  Yes Pender, Julie F, [name]  butalbital -acetaminophen -caffeine  (FIORICET) 50-325-40 MG tablet Take 1 tablet by mouth every 6 (six) hours as needed for headache. 07/31/23  Yes Gareth Mliss FALCON, [name]  Dulaglutide  (TRULICITY ) 0.75 MG/0.5ML SOPN Inject 0.75 mg into the skin once a week. Patient receives via [employer] Patient Assistance through Dec 2023 Patient taking differently: Inject 0.75 mg into the skin every Friday at 6 PM. Patient receives via [employer] Patient Assistance through Dec  2023 03/20/23  Yes Pender, Mliss FALCON, [name]  esomeprazole  (NEXIUM ) 40 MG capsule Take 1 capsule (40 mg total) by mouth at bedtime. 12/19/23  Yes Gareth Mliss FALCON, [name]  FARXIGA  5 MG TABS tablet TAKE 1 TABLET BY MOUTH DAILY BEFORE BREAKFAST. 12/23/23  Yes Pender, Julie F, [name]  HYDROmorphone  (DILAUDID ) 2 MG tablet Take 1-2 tablets (2-4 mg total) by mouth every 6 (six) hours as needed for severe pain (pain score 7-10) (severe breakthrough pain not responding to chronic oxycodone ). 09/28/23  Yes Edmisten, Kristie L, PA  hydrOXYzine  (VISTARIL ) 25 MG capsule Take 1 capsule (25 mg total) by mouth every 8 (eight) hours as needed. 12/19/23  Yes Gareth Mliss FALCON, [name]  losartan  (COZAAR ) 25 MG tablet Take 1 tablet (25 mg total) by mouth every evening. 10/25/23  Yes Pender, Julie F, [name]  meclizine  (ANTIVERT ) 25 MG tablet TAKE 1 TABLET BY MOUTH 3 TIMES DAILY AS NEEDED FOR DIZZINESS.  12/25/23  Yes Pender, Julie F, [name]  methocarbamol  (ROBAXIN ) 500 MG tablet Take 1 tablet (500 mg total) by mouth every 6 (six) hours as needed for muscle spasms. 09/28/23  Yes Edmisten, Kristie L, PA  montelukast  (SINGULAIR ) 10 MG tablet Take 1 tablet (10 mg total) by mouth at bedtime. 12/20/23  Yes Gareth Mliss FALCON, [name]  ondansetron  (ZOFRAN ) 4 MG tablet Take 1 tablet (4 mg total) by mouth every 6 (six) hours as needed for nausea. 09/28/23  Yes Edmisten, Kristie L, PA  ondansetron  (ZOFRAN -ODT) 4 MG disintegrating tablet Take 1 tablet (4 mg total) by mouth every 8 (eight) hours as needed for nausea or vomiting. 07/31/23  Yes Pender, Julie F, [name]  oxyCODONE -acetaminophen  (PERCOCET) 10-325 MG tablet Take 1 tablet by mouth every 6 (six) hours as needed for pain.   Yes [provider]  propranolol  (INDERAL ) 20 MG tablet TAKE 1 TABLET BY MOUTH THREE TIMES A DAY 12/23/23  Yes Pender, Julie F, [name]  rosuvastatin  (CRESTOR ) 40 MG tablet TAKE 1 TABLET AT BEDTIME 01/23/24  Yes Pender, Julie F, [name]  topiramate  (TOPAMAX ) 25 MG tablet Take 1 tablet (25 mg total) by mouth at  bedtime. 12/19/23  Yes Gareth Mliss FALCON, [name]  triamcinolone  ointment (KENALOG ) 0.1 % Apply 1 Application topically 2 (two) times daily. To affected areas 12/28/23  Yes Gareth Mliss FALCON, [name]  TRUEplus Lancets 33G MISC TEST BLOOD SUGAR TWO TIMES DAILY 04/07/23  Yes Pender, Julie F, [name]  citalopram  (CELEXA ) 40 MG tablet TAKE 1 TABLET EVERY DAY 02/01/24   Pender, Julie F, [name]  Continuous Glucose Sensor (FREESTYLE LIBRE 3 SENSOR) MISC 1 Device by Does not apply route every 14 (fourteen) days. 09/12/23   Gareth Mliss FALCON, [name]  famotidine  (PEPCID ) 20 MG tablet TAKE 1 TABLET BY MOUTH TWICE A DAY 01/18/24   Gareth Mliss FALCON, [name]    Allergies as of 02/06/2024 - Review Complete 12/28/2023  Allergen Reaction Noted   Metformin and related Diarrhea 01/22/2018   Adhesive [tape] Rash and Other (See Comments) 03/20/2013    Family History  Problem Relation Age of Onset   Heart attack Mother    Sarcoidosis Sister     Social History   Socioeconomic History   Marital status: Married    Spouse name: Elsie   Number of children: 3   Years of education: Not on file   Highest education level: Associate degree: occupational, Scientist, product/process development, or vocational program  Occupational History   Not on file  Tobacco Use   Smoking status: Former    Current packs/day: 0.00    Types: Cigarettes    Quit date: 2006    Years since quitting: 19.5   Smokeless tobacco: Never   Tobacco comments:    quit smoking 9yrs ago  Vaping Use   Vaping status: Never Used  Substance and Sexual Activity   Alcohol use: No   Drug use: No   Sexual activity: Yes    Partners: Male    Birth control/protection: Surgical  Other Topics Concern   Not on file  Social History Narrative   Not on file   Social Drivers of Health   Financial Resource Strain: Low Risk  (03/30/2023)   Overall Financial Resource Strain (CARDIA)    Difficulty of Paying Living Expenses: Not hard at all  Food Insecurity: No Food Insecurity (09/28/2023)   Hunger Vital Sign     Worried About Running Out of Food in the Last Year: Never true    Ran Out of  Food in the Last Year: Never true  Transportation Needs: No Transportation Needs (09/28/2023)   PRAPARE - Administrator, Civil Service (Medical): No    Lack of Transportation (Non-Medical): No  Physical Activity: Inactive (03/30/2023)   Exercise Vital Sign    Days of Exercise per Week: 0 days    Minutes of Exercise per Session: 0 min  Stress: No Stress Concern Present (03/30/2023)   [employer] of Occupational Health - Occupational Stress Questionnaire    Feeling of Stress : Not at all  Social Connections: Patient Declined (09/27/2023)   Social Connection and Isolation Panel    Frequency of Communication with Friends and Family: Patient declined    Frequency of Social Gatherings with Friends and Family: Patient declined    Attends Religious Services: Patient declined    Database administrator or Organizations: Patient declined    Attends Banker Meetings: Patient declined    Marital Status: Patient declined  Intimate Partner Violence: Not At Risk (09/28/2023)   Humiliation, Afraid, Rape, and Kick questionnaire    Fear of Current or Ex-Partner: No    Emotionally Abused: No    Physically Abused: No    Sexually Abused: No    Review of Systems: See HPI, otherwise negative ROS  Physical Exam: BP (!) 153/81   Pulse 71   Temp 97.6 F (36.4 C) (Temporal)   Resp 16   Ht 5' 3 (1.6 m)   Wt 81.2 kg   SpO2 100%   BMI 31.71 kg/m  General:   Alert,  pleasant and cooperative in NAD Head:  Normocephalic and atraumatic. Lungs:  Clear to auscultation.    Heart:  Regular rate and rhythm.   Impression/Plan: Lori Potts is here for ophthalmic surgery.  Risks, benefits, limitations, and alternatives regarding ophthalmic surgery have been reviewed with the patient.  Questions have been answered.  All parties agreeable.   MITTIE GASKIN, MD  03/06/2024, 9:32 AM

## 2024-03-07 ENCOUNTER — Encounter: Payer: Self-pay | Admitting: Ophthalmology

## 2024-03-07 NOTE — Anesthesia Preprocedure Evaluation (Addendum)
 Anesthesia Evaluation  Patient identified by MRN, date of birth, ID band Patient awake    Reviewed: Allergy & Precautions, H&P , NPO status , Patient's Chart, lab work & pertinent test results  Airway Mallampati: II  TM Distance: >3 FB     Dental   Pulmonary neg pulmonary ROS, former smoker          Cardiovascular hypertension, negative cardio ROS      Neuro/Psych negative neurological ROS  negative psych ROS   GI/Hepatic negative GI ROS, Neg liver ROS,,,  Endo/Other  negative endocrine ROSdiabetes    Renal/GU negative Renal ROS  negative genitourinary   Musculoskeletal negative musculoskeletal ROS (+)    Abdominal   Peds negative pediatric ROS (+)  Hematology negative hematology ROS (+)   Anesthesia Other Findings Previous cataract surgery 03-06-24 Dr. Myra  Arthritis  Vitamin D  deficiency Seasonal allergies  History of migraine Weakness  Low back pain History of colon polyps  Nocturia History of shingles  GERD (gastroesophageal reflux disease) Essential hypertension, benign Hyperlipidemia Cataract  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HCC) Wears dentures  Headache S/P insertion of spinal cord stimulator Asthma Type II or unspecified type diabetes mellitus without mention of complication, not stated as uncontrolled  Anxiety Dyspnea  History of anemia due to chronic kidney disease    Reproductive/Obstetrics negative OB ROS                              Anesthesia Physical Anesthesia Plan  ASA: 3  Anesthesia Plan: MAC   Post-op Pain Management:    Induction: Intravenous  PONV Risk Score and Plan:   Airway Management Planned: Natural Airway and Nasal Cannula  Additional Equipment:   Intra-op Plan:   Post-operative Plan:   Informed Consent: I have reviewed the patients History and Physical, chart, labs and discussed the procedure including the risks, benefits and  alternatives for the proposed anesthesia with the patient or authorized representative who has indicated his/her understanding and acceptance.     Dental Advisory Given  Plan Discussed with: Anesthesiologist, CRNA and Surgeon  Anesthesia Plan Comments: (Patient consented for risks of anesthesia including but not limited to:  - adverse reactions to medications - damage to eyes, teeth, lips or other oral mucosa - nerve damage due to positioning  - sore throat or hoarseness - Damage to heart, brain, nerves, lungs, other parts of body or loss of life  Patient voiced understanding and assent.)         Anesthesia Quick Evaluation

## 2024-03-11 ENCOUNTER — Other Ambulatory Visit: Payer: Self-pay | Admitting: Nurse Practitioner

## 2024-03-11 DIAGNOSIS — N1832 Chronic kidney disease, stage 3b: Secondary | ICD-10-CM | POA: Diagnosis not present

## 2024-03-11 DIAGNOSIS — I10 Essential (primary) hypertension: Secondary | ICD-10-CM | POA: Diagnosis not present

## 2024-03-11 DIAGNOSIS — E1122 Type 2 diabetes mellitus with diabetic chronic kidney disease: Secondary | ICD-10-CM | POA: Diagnosis not present

## 2024-03-11 DIAGNOSIS — G4709 Other insomnia: Secondary | ICD-10-CM

## 2024-03-11 DIAGNOSIS — D631 Anemia in chronic kidney disease: Secondary | ICD-10-CM | POA: Diagnosis not present

## 2024-03-12 DIAGNOSIS — H2512 Age-related nuclear cataract, left eye: Secondary | ICD-10-CM | POA: Diagnosis not present

## 2024-03-13 ENCOUNTER — Telehealth: Payer: Self-pay | Admitting: Nurse Practitioner

## 2024-03-13 NOTE — Telephone Encounter (Signed)
 Requested Prescriptions  Pending Prescriptions Disp Refills   hydrOXYzine  (VISTARIL ) 25 MG capsule [Pharmacy Med Name: HYDROXYZINE  PAMOATE 25 MG Oral Capsule] 30 capsule 0    Sig: TAKE 1 CAPSULE EVERY 8 HOURS AS NEEDED     Ear, Nose, and Throat:  Antihistamines 2 Failed - 03/13/2024  4:24 PM      Failed - Cr in normal range and within 360 days    Creat  Date Value Ref Range Status  12/19/2023 1.54 (H) 0.60 - 1.00 mg/dL Final   Creatinine, Urine  Date Value Ref Range Status  12/19/2023 95 20 - 275 mg/dL Final         Passed - Valid encounter within last 12 months    Recent Outpatient Visits           2 months ago Rash   [facility] Gareth Mliss FALCON, [name]   2 months ago Essential (primary) hypertension   [facility] Gareth Mliss FALCON, [name]       Future Appointments             Tomorrow Gareth, Mliss FALCON, [name] [facility], [facility]

## 2024-03-13 NOTE — Telephone Encounter (Signed)
 Patient stated she is having SOB, appointment made

## 2024-03-13 NOTE — Telephone Encounter (Unsigned)
 Copied from CRM #[id number]. Topic: General - Call Back - No Documentation >> Mar 13, 2024 10:16 AM Tiffini S wrote: Reason for CRM: Patient had a missed call today- advised no documentation is in the notes. She is asking for a call back at [phone].

## 2024-03-14 ENCOUNTER — Ambulatory Visit: Admitting: Nurse Practitioner

## 2024-03-18 ENCOUNTER — Ambulatory Visit: Admitting: Nurse Practitioner

## 2024-03-18 ENCOUNTER — Encounter: Payer: Self-pay | Admitting: Nurse Practitioner

## 2024-03-18 VITALS — BP 116/62 | HR 71 | Temp 97.6°F | Resp 18 | Ht 63.0 in | Wt 183.4 lb

## 2024-03-18 DIAGNOSIS — R0602 Shortness of breath: Secondary | ICD-10-CM

## 2024-03-18 DIAGNOSIS — J45909 Unspecified asthma, uncomplicated: Secondary | ICD-10-CM

## 2024-03-18 DIAGNOSIS — R778 Other specified abnormalities of plasma proteins: Secondary | ICD-10-CM

## 2024-03-18 DIAGNOSIS — S0512XA Contusion of eyeball and orbital tissues, left eye, initial encounter: Secondary | ICD-10-CM | POA: Diagnosis not present

## 2024-03-18 MED ORDER — BREZTRI AEROSPHERE 160-9-4.8 MCG/ACT IN AERO: 2.0000 | INHALATION_SPRAY | Freq: Two times a day (BID) | RESPIRATORY_TRACT | 5 refills | Status: DC

## 2024-03-18 MED ORDER — ALBUTEROL SULFATE HFA 108 (90 BASE) MCG/ACT IN AERS
2.0000 | INHALATION_SPRAY | Freq: Four times a day (QID) | RESPIRATORY_TRACT | 2 refills | Status: DC | PRN
Start: 2024-03-18 — End: 2024-07-11

## 2024-03-18 NOTE — Discharge Instructions (Signed)

## 2024-03-18 NOTE — Progress Notes (Signed)
 BP 116/62   Pulse 71   Temp 97.6 F (36.4 C)   Resp 18   Ht 5' 3 (1.6 m)   Wt 183 lb 6.4 oz (83.2 kg)   SpO2 92%   BMI 32.49 kg/m    Subjective:    Patient ID: Lori Potts Lower, female    DOB: [birth year], 78 y.o.   MRN: [id number]  HPI: Lori PATNAUDE is a 78 y.o. female  Chief Complaint  Patient presents with   Shortness of Breath    Onset since 4 weeks    Discussed the use of AI scribe software for clinical note transcription with the patient, who gave verbal consent to proceed.  History of Present Illness Lori Potts is a 78 year old female with asthma who presents with shortness of breath.  Dyspnea and fatigue - Shortness of breath for the past four weeks, associated with hot weather - Dyspnea occurs with minimal exertion, such as walking to the mailbox - Associated with significant fatigue - Uses albuterol  inhaler as needed and is currently on her last inhaler - Takes Breztri  twice daily for asthma  Abnormal laboratory findings - Recent blood work with nephrology on March 11, 2024, revealed abnormal SPEP and UPEP results - Hemoglobin level noted to be 10  Periorbital ecchymosis and headache - Developed a black eye over a month ago, prior to right cataract surgery two weeks ago - No recent trauma to the area - History of hitting her jaw four months ago during a fall that resulted in a broken ankle - Headaches that start in the head and radiate around the eye area           12/19/2023   10:52 AM 06/29/2023    8:17 AM 06/15/2023   10:11 AM  Depression screen PHQ 2/9  Decreased Interest 0 0 0  Down, Depressed, Hopeless 0 0 0  PHQ - 2 Score 0 0 0  Altered sleeping 0 0   Tired, decreased energy 0 0   Change in appetite 0 0   Feeling bad or failure about yourself  0 0   Trouble concentrating 0 0   Moving slowly or fidgety/restless 0 0   Suicidal thoughts 0 0   PHQ-9 Score 0 0   Difficult doing work/chores Not difficult at all Not difficult at  all     Relevant past medical, surgical, family and social history reviewed and updated as indicated. Interim medical history since our last visit reviewed. Allergies and medications reviewed and updated.  Review of Systems  Ten systems reviewed and is negative except as mentioned in HPI      Objective:     BP 116/62   Pulse 71   Temp 97.6 F (36.4 C)   Resp 18   Ht 5' 3 (1.6 m)   Wt 183 lb 6.4 oz (83.2 kg)   SpO2 92%   BMI 32.49 kg/m    Wt Readings from Last 3 Encounters:  03/18/24 183 lb 6.4 oz (83.2 kg)  03/06/24 179 lb (81.2 kg)  12/19/23 174 lb 14.4 oz (79.3 kg)    Physical Exam Physical Exam GENERAL: Alert, cooperative, well developed, no acute distress. HEENT: Normocephalic, normal oropharynx, moist mucous membranes. CHEST: Clear to auscultation bilaterally, no wheezes, rhonchi, or crackles. CARDIOVASCULAR: Normal heart rate and rhythm, S1 and S2 normal without murmurs. ABDOMEN: Soft, non-tender, non-distended, without organomegaly, normal bowel sounds. EXTREMITIES: No cyanosis or edema. NEUROLOGICAL: Cranial nerves grossly intact, moves all extremities  without gross motor or sensory deficit.   Results for orders placed or performed during the hospital encounter of 03/06/24  Glucose, capillary   Collection Time: 03/06/24  9:10 AM  Result Value Ref Range   Glucose-Capillary 135 (H) 70 - 99 mg/dL          Assessment & Plan:   Problem List Items Addressed This Visit   None Visit Diagnoses       Shortness of breath    -  Primary   Relevant Orders   Ambulatory referral to Hematology / Oncology     Abnormal SPEP       Relevant Orders   Ambulatory referral to Hematology / Oncology     Contusion of left orbital tissues, initial encounter       Relevant Orders   Ambulatory referral to Hematology / Oncology     Persistent asthma without complication, unspecified asthma severity       Relevant Medications   albuterol  (VENTOLIN  HFA) 108 (90 Base) MCG/ACT  inhaler   budesonide -glycopyrrolate -formoterol  (BREZTRI  AEROSPHERE) 160-9-4.8 MCG/ACT AERO inhaler        Assessment and Plan Assessment & Plan Asthma with shortness of breath Experiencing shortness of breath for four weeks, exacerbated by hot weather. Uses albuterol  inhaler as needed and Breztri  twice daily. Shortness of breath occurs with minimal exertion, such as walking to the mailbox. Lungs are clear on examination. Shortness of breath may be related to anemia, requiring further evaluation by hematology. - Refer to hematology for further evaluation of symptoms  Anemia with abnormal serum and urine protein electrophoresis Recent blood work showed hemoglobin level of 10, below the normal range of 11.7 to 15. Abnormal serum and urine protein electrophoresis (SPEP and UPEP) results. Referral to hematology is necessary for further workup to determine the underlying cause of anemia and abnormal electrophoresis results. Possible connection between anemia and shortness of breath. - Refer to hematology for further evaluation and workup - Provide hematology contact information for appointment scheduling  Periorbital contusion (left eye) Left eye appears bruised, resembling a periorbital contusion. No recent trauma reported. Bruising present for over a month. Possible differential includes sinus issues, but further evaluation is needed. - Document periorbital contusion in medical record       Discussed case with Dr. Glenard, likely symptoms all connected, referral placed to hematology for further work up.    Follow up plan: Return if symptoms worsen or fail to improve.

## 2024-03-20 ENCOUNTER — Ambulatory Visit
Admission: RE | Admit: 2024-03-20 | Discharge: 2024-03-20 | Disposition: A | Discharge status: Home / Self Care | Attending: Ophthalmology | Admitting: Ophthalmology

## 2024-03-20 ENCOUNTER — Ambulatory Visit: Payer: Self-pay | Admitting: Anesthesiology

## 2024-03-20 ENCOUNTER — Encounter: Payer: Self-pay | Admitting: Ophthalmology

## 2024-03-20 ENCOUNTER — Encounter
Admission: RE | Disposition: A | Discharge status: Home / Self Care | Payer: Self-pay | Source: Home / Self Care | Attending: Ophthalmology

## 2024-03-20 ENCOUNTER — Other Ambulatory Visit: Payer: Self-pay

## 2024-03-20 DIAGNOSIS — Z7984 Long term (current) use of oral hypoglycemic drugs: Secondary | ICD-10-CM | POA: Diagnosis not present

## 2024-03-20 DIAGNOSIS — Z9841 Cataract extraction status, right eye: Secondary | ICD-10-CM | POA: Insufficient documentation

## 2024-03-20 DIAGNOSIS — E1136 Type 2 diabetes mellitus with diabetic cataract: Secondary | ICD-10-CM | POA: Insufficient documentation

## 2024-03-20 DIAGNOSIS — Z87891 Personal history of nicotine dependence: Secondary | ICD-10-CM | POA: Diagnosis not present

## 2024-03-20 DIAGNOSIS — Z7985 Long-term (current) use of injectable non-insulin antidiabetic drugs: Secondary | ICD-10-CM | POA: Diagnosis not present

## 2024-03-20 DIAGNOSIS — H2512 Age-related nuclear cataract, left eye: Secondary | ICD-10-CM | POA: Diagnosis not present

## 2024-03-20 DIAGNOSIS — I10 Essential (primary) hypertension: Secondary | ICD-10-CM | POA: Insufficient documentation

## 2024-03-20 HISTORY — PX: CATARACT EXTRACTION W/PHACO: SHX586

## 2024-03-20 LAB — GLUCOSE, CAPILLARY: Glucose-Capillary: 136 mg/dL — ABNORMAL HIGH (ref 70–99)

## 2024-03-20 SURGERY — PHACOEMULSIFICATION, CATARACT, WITH IOL INSERTION
Anesthesia: Monitor Anesthesia Care | Site: Eye | Laterality: Left

## 2024-03-20 MED ORDER — FENTANYL CITRATE (PF) 100 MCG/2ML IJ SOLN
INTRAMUSCULAR | Status: DC | PRN
  Administered 2024-03-20 (×2): 50 ug via INTRAVENOUS

## 2024-03-20 MED ORDER — LACTATED RINGERS IV SOLN: INTRAVENOUS | Status: DC

## 2024-03-20 MED ORDER — SIGHTPATH DOSE#1 NA HYALUR & NA CHOND-NA HYALUR IO KIT
PACK | INTRAOCULAR | Status: DC | PRN
  Administered 2024-03-20: 1 via OPHTHALMIC

## 2024-03-20 MED ORDER — FENTANYL CITRATE (PF) 100 MCG/2ML IJ SOLN
INTRAMUSCULAR | Status: AC
Start: 2024-03-20 — End: 2024-03-20
  Filled 2024-03-20: qty 2

## 2024-03-20 MED ORDER — SIGHTPATH DOSE#1 BSS IO SOLN
INTRAOCULAR | Status: DC | PRN
  Administered 2024-03-20: 15 mL via INTRAOCULAR

## 2024-03-20 MED ORDER — BRIMONIDINE TARTRATE-TIMOLOL 0.2-0.5 % OP SOLN
OPHTHALMIC | Status: DC | PRN
  Administered 2024-03-20: 1 [drp] via OPHTHALMIC

## 2024-03-20 MED ORDER — TETRACAINE HCL 0.5 % OP SOLN
OPHTHALMIC | Status: AC
  Filled 2024-03-20: qty 4

## 2024-03-20 MED ORDER — LIDOCAINE HCL (PF) 2 % IJ SOLN
INTRAOCULAR | Status: DC | PRN
  Administered 2024-03-20: 2 mL

## 2024-03-20 MED ORDER — ARMC OPHTHALMIC DILATING DROPS
OPHTHALMIC | Status: AC
  Filled 2024-03-20: qty 0.5

## 2024-03-20 MED ORDER — TETRACAINE HCL 0.5 % OP SOLN
1.0000 [drp] | OPHTHALMIC | Status: DC | PRN
  Administered 2024-03-20 (×3): 1 [drp] via OPHTHALMIC

## 2024-03-20 MED ORDER — MIDAZOLAM HCL 5 MG/5ML IJ SOLN
INTRAMUSCULAR | Status: DC | PRN
Start: 2024-03-20 — End: 2024-03-20
  Administered 2024-03-20 (×2): 1 mg via INTRAVENOUS

## 2024-03-20 MED ORDER — SIGHTPATH DOSE#1 BSS IO SOLN
INTRAOCULAR | Status: DC | PRN
  Administered 2024-03-20: 69 mL via OPHTHALMIC

## 2024-03-20 MED ORDER — CEFUROXIME OPHTHALMIC INJECTION 1 MG/0.1 ML
INJECTION | OPHTHALMIC | Status: DC | PRN
  Administered 2024-03-20: 1 mg via INTRACAMERAL

## 2024-03-20 MED ORDER — ARMC OPHTHALMIC DILATING DROPS
1.0000 | OPHTHALMIC | Status: DC | PRN
  Administered 2024-03-20 (×3): 1 via OPHTHALMIC

## 2024-03-20 MED ORDER — MIDAZOLAM HCL 2 MG/2ML IJ SOLN
INTRAMUSCULAR | Status: AC
Start: 2024-03-20 — End: 2024-03-20
  Filled 2024-03-20: qty 2

## 2024-03-20 SURGICAL SUPPLY — 9 items
CATARACT SUITE SIGHTPATH (MISCELLANEOUS) ×1 IMPLANT
FEE CATARACT SUITE SIGHTPATH (MISCELLANEOUS) ×1 IMPLANT
GLOVE BIOGEL PI IND STRL 8 (GLOVE) ×1 IMPLANT
GLOVE SURG LX STRL 7.5 STRW (GLOVE) ×1 IMPLANT
GLOVE SURG SYN 6.5 PF PI BL (GLOVE) ×1 IMPLANT
LENS IOL TECNIS EYHANCE 20.0 (Intraocular Lens) IMPLANT
NDL FILTER BLUNT 18X1 1/2 (NEEDLE) ×1 IMPLANT
NEEDLE FILTER BLUNT 18X1 1/2 (NEEDLE) ×1 IMPLANT
SYR 3ML LL SCALE MARK (SYRINGE) ×1 IMPLANT

## 2024-03-20 NOTE — H&P (Signed)
 [facility]   Primary Care Physician:  Gareth Mliss FALCON, [name] Ophthalmologist: Dr. Dene Etienne  Pre-Procedure History & Physical: HPI:  Lori Potts is a 78 y.o. female here for ophthalmic surgery.   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   Cataract    right eye but immature   Dyspnea    Essential hypertension, benign    takes Lisinopril -HCTZ daily   GERD (gastroesophageal reflux disease)    Headache    sinus   History of anemia due to chronic kidney disease    History of colon polyps    benign   History of migraine    History of shingles    Hyperlipidemia    takes Lipitor daily   Low back pain    Nocturia    S/P insertion of spinal cord stimulator    Seasonal allergies    takes Singulair  daily as needed   Type II or unspecified type diabetes mellitus without mention of complication, not stated as uncontrolled    Vitamin D  deficiency    takes Vit D weekly   Weakness    numbness and tingling left arm   Wears dentures    full upper and lower    Past Surgical History:  Procedure Laterality Date   ABDOMINAL HYSTERECTOMY     ANTERIOR CERVICAL DECOMP/DISCECTOMY FUSION N/A 02/26/2015   Procedure: ANTERIOR CERVICAL DISCECTOMY FUSION C4-5 (1 LEVEL);  Surgeon: Donaciano Sprang, MD;  Location: [facility] OR;  Service: Orthopedics;  Laterality: N/A;   ANTERIOR LAT LUMBAR FUSION N/A 03/21/2013   Procedure: ANTERIOR LATERAL LUMBAR FUSION 1 LEVEL/ XLIF L3-L4 ;  Surgeon: Donaciano Sprang, MD;  Location: [facility] OR;  Service: Orthopedics;  Laterality: N/A;   ANTERIOR LATERAL LUMBAR FUSION WITH PERCUTANEOUS SCREW 1 LEVEL N/A 01/30/2023   Procedure: LATERAL INTERBODY FUSION LUMBAR TWO TO THREE;  Surgeon: Sprang Donaciano, MD;  Location: [facility] OR;  Service: Orthopedics;  Laterality: N/A;  5 HRS 3 C-BED LEFT TAP BLOCK WITH EXPAREL    APPENDECTOMY     AUGMENTATION MAMMAPLASTY Bilateral 1978   BACK SURGERY     BACK  SURGERY     Lumbar fusion x 2   BREAST EXCISIONAL BIOPSY Right 1970   BREAST SURGERY  1990   Augementation   CARDIAC CATHETERIZATION  5/12   ef 55%   CARDIAC CATHETERIZATION  10/2010   [facility]; EF 55%   CARDIAC CATHETERIZATION Left 02/16/2016   Procedure: Left Heart Cath and Coronary Angiography;  Surgeon: Cara JONETTA Lovelace, MD;  Location: [facility] INVASIVE [facility] LAB;  Service: Cardiovascular;  Laterality: Left;   CATARACT EXTRACTION W/PHACO Right 03/06/2024   Procedure: PHACOEMULSIFICATION, CATARACT, WITH IOL INSERTION 6.60 00:44.6;  Surgeon: Etienne Dene, MD;  Location: [facility] ORS;  Service: Ophthalmology;  Laterality: Right;   COLONOSCOPY     COLONOSCOPY WITH PROPOFOL  N/A 04/10/2017   Procedure: COLONOSCOPY WITH PROPOFOL ;  Surgeon: Jinny Carmine, MD;  Location: [facility];  Service: Gastroenterology;  Laterality: N/A;  Diabetic - insulin    ESOPHAGOGASTRODUODENOSCOPY     JOINT REPLACEMENT     KIDNEY SURGERY  1998   growth removed from left kidney    LEFT HEART CATH AND CORONARY ANGIOGRAPHY Left 09/10/2018   Procedure: LEFT HEART CATH AND CORONARY ANGIOGRAPHY;  Surgeon: Lovelace Cara JONETTA, MD;  Location: [facility];  Service: Cardiovascular;  Laterality: Left;   pain stimulator     POSTERIOR  CERVICAL FUSION/FORAMINOTOMY Right 03/21/2013   Procedure: POSTERIOR L2-3 RIGHT FORAMINOTOMY;  Surgeon: Donaciano Sprang, MD;  Location: [facility] OR;  Service: Orthopedics;  Laterality: Right;   REVISION OF SCAR TISSUE RECTUS MUSCLE     SHOULDER ARTHROSCOPY WITH ROTATOR CUFF REPAIR AND SUBACROMIAL DECOMPRESSION Right 09/17/2020   Procedure: RIGHT SHOULDER ARTHROSCOPY WITH MINI-OPEN ROTATOR CUFF REPAIR, DISTAL CLAVICLE EXCISION, AND SUBACROMIAL DECOMPRESSION WITH BICEP TENDONESIS;  Surgeon: Marchia Drivers, MD;  Location: [facility] ORS;  Service: Orthopedics;  Laterality: Right;   SMALL BOWEL REPAIR     SPINAL CORD STIMULATOR BATTERY EXCHANGE N/A 10/17/2012   Procedure: SPINAL CORD STIMULATOR BATTERY  REMOVAL;  Surgeon: Donaciano Sprang, MD;  Location: [facility] OR;  Service: Orthopedics;  Laterality: N/A;   SPINAL CORD STIMULATOR BATTERY EXCHANGE N/A 07/22/2015   Procedure: REIMPLANTATION OF SPINAL CORD STIMULATOR BATTERY ;  Surgeon: Donaciano Sprang, MD;  Location: [facility] OR;  Service: Orthopedics;  Laterality: N/A;   TOTAL HIP ARTHROPLASTY Left 06/08/2022   Procedure: TOTAL HIP ARTHROPLASTY ANTERIOR APPROACH;  Surgeon: Melodi Lerner, MD;  Location: [facility]S;  Service: Orthopedics;  Laterality: Left;   TOTAL HIP ARTHROPLASTY Right 09/27/2023   Procedure: TOTAL HIP ARTHROPLASTY ANTERIOR APPROACH;  Surgeon: Melodi Lerner, MD;  Location: [facility]S;  Service: Orthopedics;  Laterality: Right;   TOTAL KNEE ARTHROPLASTY Left    TOTAL KNEE ARTHROPLASTY Right 07/28/2017   Procedure: RIGHT TOTAL KNEE ARTHROPLASTY;  Surgeon: Gerome Charleston, MD;  Location: [facility];  Service: Orthopedics;  Laterality: Right;    Prior to Admission medications   Medication Sig Start Date End Date Taking? Authorizing Provider  albuterol  (VENTOLIN  HFA) 108 (90 Base) MCG/ACT inhaler Inhale 2 puffs into the lungs every 6 (six) hours as needed for wheezing or shortness of breath. 03/18/24  Yes Gareth Mliss FALCON, [name]  Alcohol Swabs  (DROPSAFE ALCOHOL PREP) 70 % PADS USE TO TEST BLOOD SUGAR TWO TIMES DAILY 11/08/23  Yes Pender, Julie F, [name]  Blood Glucose Monitoring Suppl (TRUE METRIX AIR GLUCOSE METER) w/Device KIT USE AS DIRECTED 11/08/23  Yes Pender, Julie F, [name]  budesonide -glycopyrrolate -formoterol  (BREZTRI  AEROSPHERE) 160-9-4.8 MCG/ACT AERO inhaler Inhale 2 puffs into the lungs 2 (two) times daily. 03/18/24  Yes Gareth Mliss FALCON, [name]  busPIRone  (BUSPAR ) 5 MG tablet TAKE 1 TABLET THREE TIMES DAILY AS NEEDED 05/11/23  Yes Pender, Julie F, [name]  butalbital -acetaminophen -caffeine  (FIORICET) 50-325-40 MG tablet Take 1 tablet by mouth every 6 (six) hours as needed for headache. 07/31/23  Yes Pender, Julie F, [name]  citalopram  (CELEXA ) 40 MG tablet TAKE 1 TABLET  EVERY DAY 02/01/24  Yes Pender, Julie F, [name]  Continuous Glucose Sensor (FREESTYLE LIBRE 3 SENSOR) MISC 1 Device by Does not apply route every 14 (fourteen) days. 09/12/23  Yes Pender, Julie F, [name]  Dulaglutide  (TRULICITY ) 0.75 MG/0.5ML SOPN Inject 0.75 mg into the skin once a week. Patient receives via [employer] Patient Assistance through Dec 2023 Patient taking differently: Inject 0.75 mg into the skin every Friday at 6 PM. Patient receives via [employer] Patient Assistance through Dec 2023 03/20/23  Yes Pender, Mliss FALCON, [name]  esomeprazole  (NEXIUM ) 40 MG capsule Take 1 capsule (40 mg total) by mouth at bedtime. 12/19/23  Yes Gareth Mliss FALCON, [name]  famotidine  (PEPCID ) 20 MG tablet TAKE 1 TABLET BY MOUTH TWICE A DAY 01/18/24  Yes Pender, Julie F, [name]  FARXIGA  5 MG TABS tablet TAKE 1 TABLET BY MOUTH DAILY BEFORE BREAKFAST. 12/23/23  Yes Pender, Julie F, [name]  HYDROmorphone  (DILAUDID ) 2 MG tablet Take 1-2 tablets (  2-4 mg total) by mouth every 6 (six) hours as needed for severe pain (pain score 7-10) (severe breakthrough pain not responding to chronic oxycodone ). 09/28/23  Yes Edmisten, Kristie L, PA  hydrOXYzine  (VISTARIL ) 25 MG capsule TAKE 1 CAPSULE EVERY 8 HOURS AS NEEDED 03/13/24  Yes Pender, Julie F, [name]  losartan  (COZAAR ) 25 MG tablet Take 1 tablet (25 mg total) by mouth every evening. 10/25/23  Yes Pender, Julie F, [name]  meclizine  (ANTIVERT ) 25 MG tablet TAKE 1 TABLET BY MOUTH 3 TIMES DAILY AS NEEDED FOR DIZZINESS. 12/25/23  Yes Pender, Julie F, [name]  methocarbamol  (ROBAXIN ) 500 MG tablet Take 1 tablet (500 mg total) by mouth every 6 (six) hours as needed for muscle spasms. 09/28/23  Yes Edmisten, Kristie L, PA  montelukast  (SINGULAIR ) 10 MG tablet Take 1 tablet (10 mg total) by mouth at bedtime. 12/20/23  Yes Gareth Mliss FALCON, [name]  ondansetron  (ZOFRAN ) 4 MG tablet Take 1 tablet (4 mg total) by mouth every 6 (six) hours as needed for nausea. 09/28/23  Yes Edmisten, Kristie L, PA  ondansetron  (ZOFRAN -ODT) 4 MG  disintegrating tablet Take 1 tablet (4 mg total) by mouth every 8 (eight) hours as needed for nausea or vomiting. 07/31/23  Yes Pender, Julie F, [name]  oxyCODONE -acetaminophen  (PERCOCET) 10-325 MG tablet Take 1 tablet by mouth every 6 (six) hours as needed for pain.   Yes [provider]  propranolol  (INDERAL ) 20 MG tablet TAKE 1 TABLET BY MOUTH THREE TIMES A DAY 12/23/23  Yes Pender, Julie F, [name]  rosuvastatin  (CRESTOR ) 40 MG tablet TAKE 1 TABLET AT BEDTIME 01/23/24  Yes Pender, Julie F, [name]  topiramate  (TOPAMAX ) 25 MG tablet Take 1 tablet (25 mg total) by mouth at bedtime. 12/19/23  Yes Pender, Julie F, [name]  triamcinolone  ointment (KENALOG ) 0.1 % Apply 1 Application topically 2 (two) times daily. To affected areas 12/28/23  Yes Gareth Mliss FALCON, [name]  TRUEplus Lancets 33G MISC TEST BLOOD SUGAR TWO TIMES DAILY 04/07/23  Yes Gareth Mliss FALCON, [name]    Allergies as of 02/06/2024 - Review Complete 12/28/2023  Allergen Reaction Noted   Metformin and related Diarrhea 01/22/2018   Adhesive [tape] Rash and Other (See Comments) 03/20/2013    Family History  Problem Relation Age of Onset   Heart attack Mother    Sarcoidosis Sister     Social History   Socioeconomic History   Marital status: Married    Spouse name: Elsie   Number of children: 3   Years of education: Not on file   Highest education level: Associate degree: occupational, Scientist, product/process development, or vocational program  Occupational History   Not on file  Tobacco Use   Smoking status: Former    Current packs/day: 0.00    Types: Cigarettes    Quit date: 2006    Years since quitting: 19.5   Smokeless tobacco: Never   Tobacco comments:    quit smoking 29yrs ago  Vaping Use   Vaping status: Never Used  Substance and Sexual Activity   Alcohol use: No   Drug use: No   Sexual activity: Yes    Partners: Male    Birth control/protection: Surgical  Other Topics Concern   Not on file  Social History Narrative   Not on file   Social Drivers  of Health   Financial Resource Strain: Low Risk  (03/30/2023)   Overall Financial Resource Strain (CARDIA)    Difficulty of Paying Living Expenses: Not hard at all  Food Insecurity: No Food  Insecurity (09/28/2023)   Hunger Vital Sign    Worried About Running Out of Food in the Last Year: Never true    Ran Out of Food in the Last Year: Never true  Transportation Needs: No Transportation Needs (09/28/2023)   PRAPARE - Administrator, Civil Service (Medical): No    Lack of Transportation (Non-Medical): No  Physical Activity: Inactive (03/30/2023)   Exercise Vital Sign    Days of Exercise per Week: 0 days    Minutes of Exercise per Session: 0 min  Stress: No Stress Concern Present (03/30/2023)   [employer] of Occupational Health - Occupational Stress Questionnaire    Feeling of Stress : Not at all  Social Connections: Patient Declined (09/27/2023)   Social Connection and Isolation Panel    Frequency of Communication with Friends and Family: Patient declined    Frequency of Social Gatherings with Friends and Family: Patient declined    Attends Religious Services: Patient declined    Database administrator or Organizations: Patient declined    Attends Banker Meetings: Patient declined    Marital Status: Patient declined  Intimate Partner Violence: Not At Risk (09/28/2023)   Humiliation, Afraid, Rape, and Kick questionnaire    Fear of Current or Ex-Partner: No    Emotionally Abused: No    Physically Abused: No    Sexually Abused: No    Review of Systems: See HPI, otherwise negative ROS  Physical Exam: BP (!) 161/85   Pulse 66   Temp (!) 96.9 F (36.1 C) (Temporal)   Ht 5' 3 (1.6 m)   Wt 81.6 kg   SpO2 95%   BMI 31.89 kg/m  General:   Alert,  pleasant and cooperative in NAD Head:  Normocephalic and atraumatic. Lungs:  Clear to auscultation.    Heart:  Regular rate and rhythm.   Impression/Plan: [name] is here for ophthalmic  surgery.  Risks, benefits, limitations, and alternatives regarding ophthalmic surgery have been reviewed with the patient.  Questions have been answered.  All parties agreeable.   MITTIE GASKIN, MD  03/20/2024, 7:30 AM

## 2024-03-20 NOTE — Transfer of Care (Signed)
 Immediate Anesthesia Transfer of Care Note  Patient: Lori Potts  Procedure(s) Performed: PHACOEMULSIFICATION, CATARACT, WITH IOL INSERTION 7.53 00:55.7 (Left: Eye)  Patient Location: PACU  Anesthesia Type: MAC  Level of Consciousness: awake, alert  and patient cooperative  Airway and Oxygen Therapy: Patient Spontanous Breathing and Patient connected to supplemental oxygen  Post-op Assessment: Post-op Vital signs reviewed, Patient's Cardiovascular Status Stable, Respiratory Function Stable, Patent Airway and No signs of Nausea or vomiting  Post-op Vital Signs: Reviewed and stable  Complications: No notable events documented.

## 2024-03-20 NOTE — Op Note (Signed)
 OPERATIVE NOTE  BAELYN DORING [id number] 03/20/2024   PREOPERATIVE DIAGNOSIS:  Nuclear sclerotic cataract left eye. H25.12   POSTOPERATIVE DIAGNOSIS:    Nuclear sclerotic cataract left eye.     PROCEDURE:  Phacoemusification with posterior chamber intraocular lens placement of the left eye  Ultrasound time: Procedure(s): PHACOEMULSIFICATION, CATARACT, WITH IOL INSERTION 7.53 00:55.7 (Left)  LENS:   Implant Name Type Inv. Item Serial No. Manufacturer Lot No. LRB No. Used Action  LENS IOL TECNIS EYHANCE 20.0 - [id number] Intraocular Lens LENS IOL TECNIS EYHANCE 20.0 [id number] [employer]  Left 1 Implanted      SURGEON:  Dene FABIENE Etienne, MD   ANESTHESIA:  Topical with tetracaine  drops and 2% Xylocaine  jelly, augmented with 1% preservative-free intracameral lidocaine .    COMPLICATIONS:  None.   DESCRIPTION OF PROCEDURE:  The patient was identified in the holding room and transported to the operating room and placed in the supine position under the operating microscope.  The left eye was identified as the operative eye and it was prepped and draped in the usual sterile ophthalmic fashion.   A 1 millimeter clear-corneal paracentesis was made at the 1:30 position.  0.5 ml of preservative-free 1% lidocaine  was injected into the anterior chamber.  The anterior chamber was filled with Viscoat viscoelastic.  A 2.4 millimeter keratome was used to make a near-clear corneal incision at the 10:30 position.  .  A curvilinear capsulorrhexis was made with a cystotome and capsulorrhexis forceps.  Balanced salt solution was used to hydrodissect and hydrodelineate the nucleus.   Phacoemulsification was then used in stop and chop fashion to remove the lens nucleus and epinucleus.  The remaining cortex was then removed using the irrigation and aspiration handpiece. Provisc was then placed into the capsular bag to distend it for lens placement.  A lens was then injected into the capsular bag.  The  remaining viscoelastic was aspirated.   Wounds were hydrated with balanced salt solution.  The anterior chamber was inflated to a physiologic pressure with balanced salt solution.  No wound leaks were noted. Cefuroxime  0.1 ml of a 10mg /ml solution was injected into the anterior chamber for a dose of 1 mg of intracameral antibiotic at the completion of the case.   Timolol  and Brimonidine  drops were applied to the eye.  The patient was taken to the recovery room in stable condition without complications of anesthesia or surgery.  Ashaya Raftery 03/20/2024, 8:18 AM

## 2024-03-21 ENCOUNTER — Encounter: Payer: Self-pay | Admitting: Ophthalmology

## 2024-03-21 NOTE — Anesthesia Postprocedure Evaluation (Signed)
 Anesthesia Post Note  Patient: Lori Potts  Procedure(s) Performed: PHACOEMULSIFICATION, CATARACT, WITH IOL INSERTION 7.53 00:55.7 (Left: Eye)  Patient location during evaluation: PACU Anesthesia Type: MAC Level of consciousness: awake and alert Pain management: pain level controlled Vital Signs Assessment: post-procedure vital signs reviewed and stable Respiratory status: spontaneous breathing, nonlabored ventilation, respiratory function stable and patient connected to nasal cannula oxygen Cardiovascular status: stable and blood pressure returned to baseline Postop Assessment: no apparent nausea or vomiting Anesthetic complications: no   No notable events documented.   Last Vitals:  Vitals:   03/20/24 0820 03/20/24 0824  BP: (!) 159/66 (!) 150/72  Pulse: 60 63  Resp: 10 12  Temp: (!) 36.1 C (!) 36.1 C  SpO2: 96% 95%    Last Pain:  Vitals:   03/20/24 0824  TempSrc:   PainSc: 0-No pain                 Nea Gittens C Kasiyah Platter

## 2024-04-02 ENCOUNTER — Inpatient Hospital Stay: Attending: Oncology | Admitting: Oncology

## 2024-04-02 ENCOUNTER — Inpatient Hospital Stay

## 2024-04-04 ENCOUNTER — Ambulatory Visit (INDEPENDENT_AMBULATORY_CARE_PROVIDER_SITE_OTHER): Payer: Medicare HMO

## 2024-04-04 DIAGNOSIS — Z0000 Encounter for general adult medical examination without abnormal findings: Secondary | ICD-10-CM | POA: Diagnosis not present

## 2024-04-04 NOTE — Progress Notes (Signed)
 Subjective:   Lori Potts is a 78 y.o. who presents for a Medicare Wellness preventive visit.  As a reminder, Annual Wellness Visits don't include a physical exam, and some assessments may be limited, especially if this visit is performed virtually. We may recommend an in-person follow-up visit with your provider if needed.  Visit Complete: Virtual I connected with  [name] on 04/04/24 by a audio enabled telemedicine application and verified that I am speaking with the correct person using two identifiers.  Patient Location: Home  Provider Location: Home Office  I discussed the limitations of evaluation and management by telemedicine. The patient expressed understanding and agreed to proceed.  Vital Signs: Because this visit was a virtual/telehealth visit, some criteria may be missing or patient reported. Any vitals not documented were not able to be obtained and vitals that have been documented are patient reported.  VideoDeclined- This patient declined Librarian, academic. Therefore the visit was completed with audio only.  Persons Participating in Visit: Patient.  AWV Questionnaire: No: Patient Medicare AWV questionnaire was not completed prior to this visit.  Cardiac Risk Factors include: advanced age (>50men, >70 women);diabetes mellitus;hypertension;dyslipidemia;sedentary lifestyle;obesity (BMI >30kg/m2)     Objective:    Today's Vitals   04/04/24 0815  PainSc: 0-No pain   There is no height or weight on file to calculate BMI.     04/04/2024    8:23 AM 03/20/2024    7:11 AM 03/06/2024    9:02 AM 09/27/2023   12:00 PM 09/19/2023    8:09 AM 03/30/2023    8:29 AM 01/20/2023   10:18 AM  Advanced Directives  Does Patient Have a Medical Advance Directive? Yes Yes Yes Yes Yes Yes Yes  Type of Estate agent of [place];Living will Healthcare Power of [place];Living will Healthcare Power of [place];Living will Living  will Healthcare Power of [place];Living will Healthcare Power of [place];Living will Healthcare Power of [place];Living will  Does patient want to make changes to medical advance directive? No - Patient declined  No - Patient declined No - Patient declined     Copy of Healthcare Power of Attorney in Chart? No - copy requested  No - copy requested    No - copy requested    Current Medications (verified) Outpatient Encounter Medications as of 04/04/2024  Medication Sig   albuterol  (VENTOLIN  HFA) 108 (90 Base) MCG/ACT inhaler Inhale 2 puffs into the lungs every 6 (six) hours as needed for wheezing or shortness of breath.   Alcohol Swabs  (DROPSAFE ALCOHOL PREP) 70 % PADS USE TO TEST BLOOD SUGAR TWO TIMES DAILY   Blood Glucose Monitoring Suppl (TRUE METRIX AIR GLUCOSE METER) w/Device KIT USE AS DIRECTED   budesonide -glycopyrrolate -formoterol  (BREZTRI  AEROSPHERE) 160-9-4.8 MCG/ACT AERO inhaler Inhale 2 puffs into the lungs 2 (two) times daily.   busPIRone  (BUSPAR ) 5 MG tablet TAKE 1 TABLET THREE TIMES DAILY AS NEEDED   butalbital -acetaminophen -caffeine  (FIORICET) 50-325-40 MG tablet Take 1 tablet by mouth every 6 (six) hours as needed for headache.   citalopram  (CELEXA ) 40 MG tablet TAKE 1 TABLET EVERY DAY   Continuous Glucose Sensor (FREESTYLE LIBRE 3 SENSOR) MISC 1 Device by Does not apply route every 14 (fourteen) days.   Dulaglutide  (TRULICITY ) 0.75 MG/0.5ML SOPN Inject 0.75 mg into the skin once a week. Patient receives via [employer] Patient Assistance through Dec 2023   esomeprazole  (NEXIUM ) 40 MG capsule Take 1 capsule (40 mg total) by mouth at bedtime.   famotidine  (PEPCID )  20 MG tablet TAKE 1 TABLET BY MOUTH TWICE A DAY   FARXIGA  5 MG TABS tablet TAKE 1 TABLET BY MOUTH DAILY BEFORE BREAKFAST.   hydrOXYzine  (VISTARIL ) 25 MG capsule TAKE 1 CAPSULE EVERY 8 HOURS AS NEEDED   losartan  (COZAAR ) 25 MG tablet Take 1 tablet (25 mg total) by mouth every evening.   meclizine  (ANTIVERT ) 25 MG tablet  TAKE 1 TABLET BY MOUTH 3 TIMES DAILY AS NEEDED FOR DIZZINESS.   methocarbamol  (ROBAXIN ) 500 MG tablet Take 1 tablet (500 mg total) by mouth every 6 (six) hours as needed for muscle spasms.   montelukast  (SINGULAIR ) 10 MG tablet Take 1 tablet (10 mg total) by mouth at bedtime.   ondansetron  (ZOFRAN -ODT) 4 MG disintegrating tablet Take 1 tablet (4 mg total) by mouth every 8 (eight) hours as needed for nausea or vomiting.   oxyCODONE -acetaminophen  (PERCOCET) 10-325 MG tablet Take 1 tablet by mouth every 6 (six) hours as needed for pain.   propranolol  (INDERAL ) 20 MG tablet TAKE 1 TABLET BY MOUTH THREE TIMES A DAY   rosuvastatin  (CRESTOR ) 40 MG tablet TAKE 1 TABLET AT BEDTIME   topiramate  (TOPAMAX ) 25 MG tablet Take 1 tablet (25 mg total) by mouth at bedtime.   triamcinolone  ointment (KENALOG ) 0.1 % Apply 1 Application topically 2 (two) times daily. To affected areas   TRUEplus Lancets 33G MISC TEST BLOOD SUGAR TWO TIMES DAILY   HYDROmorphone  (DILAUDID ) 2 MG tablet Take 1-2 tablets (2-4 mg total) by mouth every 6 (six) hours as needed for severe pain (pain score 7-10) (severe breakthrough pain not responding to chronic oxycodone ). (Patient not taking: Reported on 04/04/2024)   ondansetron  (ZOFRAN ) 4 MG tablet Take 1 tablet (4 mg total) by mouth every 6 (six) hours as needed for nausea.   No facility-administered encounter medications on file as of 04/04/2024.    Allergies (verified) Metformin and related, Adhesive [tape], and Wound dressing adhesive   History: 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   Cataract    right eye but immature   Dyspnea    Essential hypertension, benign    takes Lisinopril -HCTZ daily   GERD (gastroesophageal reflux disease)    Headache    sinus   History of anemia due to chronic kidney disease    History of colon polyps    benign   History of migraine    History of shingles     Hyperlipidemia    takes Lipitor daily   Low back pain    Nocturia    S/P insertion of spinal cord stimulator    Seasonal allergies    takes Singulair  daily as needed   Type II or unspecified type diabetes mellitus without mention of complication, not stated as uncontrolled    Vitamin D  deficiency    takes Vit D weekly   Weakness    numbness and tingling left arm   Wears dentures    full upper and lower   Past Surgical History:  Procedure Laterality Date   ABDOMINAL HYSTERECTOMY     ANTERIOR CERVICAL DECOMP/DISCECTOMY FUSION N/A 02/26/2015   Procedure: ANTERIOR CERVICAL DISCECTOMY FUSION C4-5 (1 LEVEL);  Surgeon: Donaciano Sprang, MD;  Location: [facility];  Service: Orthopedics;  Laterality: N/A;   ANTERIOR LAT LUMBAR FUSION N/A 03/21/2013   Procedure: ANTERIOR LATERAL LUMBAR FUSION 1 LEVEL/ XLIF L3-L4 ;  Surgeon: Donaciano Sprang, MD;  Location: [facility] OR;  Service: Orthopedics;  Laterality: N/A;   ANTERIOR LATERAL LUMBAR FUSION WITH PERCUTANEOUS SCREW 1 LEVEL N/A 01/30/2023   Procedure: LATERAL INTERBODY FUSION LUMBAR TWO TO THREE;  Surgeon: Burnetta Aures, MD;  Location: [facility] OR;  Service: Orthopedics;  Laterality: N/A;  5 HRS 3 C-BED LEFT TAP BLOCK WITH EXPAREL    APPENDECTOMY     AUGMENTATION MAMMAPLASTY Bilateral 1978   BACK SURGERY     BACK SURGERY     Lumbar fusion x 2   BREAST EXCISIONAL BIOPSY Right 1970   BREAST SURGERY  1990   Augementation   CARDIAC CATHETERIZATION  5/12   ef 55%   CARDIAC CATHETERIZATION  10/2010   [facility]; EF 55%   CARDIAC CATHETERIZATION Left 02/16/2016   Procedure: Left Heart Cath and Coronary Angiography;  Surgeon: Cara JONETTA Lovelace, MD;  Location: [facility] INVASIVE CV LAB;  Service: Cardiovascular;  Laterality: Left;   CATARACT EXTRACTION W/PHACO Right 03/06/2024   Procedure: PHACOEMULSIFICATION, CATARACT, WITH IOL INSERTION 6.60 00:44.6;  Surgeon: Mittie Gaskin, MD;  Location: [facility] ORS;  Service: Ophthalmology;  Laterality: Right;   CATARACT EXTRACTION W/PHACO  Left 03/20/2024   Procedure: PHACOEMULSIFICATION, CATARACT, WITH IOL INSERTION 7.53 00:55.7;  Surgeon: Mittie Gaskin, MD;  Location: [facility];  Service: Ophthalmology;  Laterality: Left;   COLONOSCOPY     COLONOSCOPY WITH PROPOFOL  N/A 04/10/2017   Procedure: COLONOSCOPY WITH PROPOFOL ;  Surgeon: Jinny Carmine, MD;  Location: [facility];  Service: Gastroenterology;  Laterality: N/A;  Diabetic - insulin    ESOPHAGOGASTRODUODENOSCOPY     JOINT REPLACEMENT     KIDNEY SURGERY  1998   growth removed from left kidney    LEFT HEART CATH AND CORONARY ANGIOGRAPHY Left 09/10/2018   Procedure: LEFT HEART CATH AND CORONARY ANGIOGRAPHY;  Surgeon: Lovelace Cara JONETTA, MD;  Location: [facility] INVASIVE CV LAB;  Service: Cardiovascular;  Laterality: Left;   pain stimulator     POSTERIOR CERVICAL FUSION/FORAMINOTOMY Right 03/21/2013   Procedure: POSTERIOR L2-3 RIGHT FORAMINOTOMY;  Surgeon: Aures Burnetta, MD;  Location: [facility] OR;  Service: Orthopedics;  Laterality: Right;   REVISION OF SCAR TISSUE RECTUS MUSCLE     SHOULDER ARTHROSCOPY WITH ROTATOR CUFF REPAIR AND SUBACROMIAL DECOMPRESSION Right 09/17/2020   Procedure: RIGHT SHOULDER ARTHROSCOPY WITH MINI-OPEN ROTATOR CUFF REPAIR, DISTAL CLAVICLE EXCISION, AND SUBACROMIAL DECOMPRESSION WITH BICEP TENDONESIS;  Surgeon: Marchia Drivers, MD;  Location: [facility] ORS;  Service: Orthopedics;  Laterality: Right;   SMALL BOWEL REPAIR     SPINAL CORD STIMULATOR BATTERY EXCHANGE N/A 10/17/2012   Procedure: SPINAL CORD STIMULATOR BATTERY REMOVAL;  Surgeon: Aures Burnetta, MD;  Location[facility];  Service: Orthopedics;  Laterality: N/A;   SPINAL CORD STIMULATOR BATTERY EXCHANGE N/A 07/22/2015   Procedure: REIMPLANTATION OF SPINAL CORD STIMULATOR BATTERY ;  Surgeon: Aures Burnetta, MD;  Location: [facility] OR;  Service: Orthopedics;  Laterality: N/A;   TOTAL HIP ARTHROPLASTY Left 06/08/2022   Procedure: TOTAL HIP ARTHROPLASTY ANTERIOR APPROACH;  Surgeon: Melodi Lerner, MD;   Location: [facility]S;  Service: Orthopedics;  Laterality: Left;   TOTAL HIP ARTHROPLASTY Right 09/27/2023   Procedure: TOTAL HIP ARTHROPLASTY ANTERIOR APPROACH;  Surgeon: Melodi Lerner, MD;  Location: [facility];  Service: Orthopedics;  Laterality: Right;   TOTAL KNEE ARTHROPLASTY Left    TOTAL KNEE ARTHROPLASTY Right 07/28/2017   Procedure: RIGHT TOTAL KNEE ARTHROPLASTY;  Surgeon: Gerome Charleston, MD;  Location: [facility] ORS;  Service: Orthopedics;  Laterality: Right;   Family History  Problem Relation Age of Onset   Heart attack Mother    Sarcoidosis  Sister    Social History   Socioeconomic History   Marital status: Married    Spouse name: Elsie   Number of children: 3   Years of education: Not on file   Highest education level: Associate degree: occupational, Scientist, product/process development, or vocational program  Occupational History   Not on file  Tobacco Use   Smoking status: Former    Current packs/day: 0.00    Types: Cigarettes    Quit date: 2006    Years since quitting: 19.6   Smokeless tobacco: Never   Tobacco comments:    quit smoking 76yrs ago  Vaping Use   Vaping status: Never Used  Substance and Sexual Activity   Alcohol use: No   Drug use: No   Sexual activity: Yes    Partners: Male    Birth control/protection: Surgical  Other Topics Concern   Not on file  Social History Narrative   Not on file   Social Drivers of Health   Financial Resource Strain: Low Risk  (04/04/2024)   Overall Financial Resource Strain (CARDIA)    Difficulty of Paying Living Expenses: Not very hard  Food Insecurity: No Food Insecurity (04/04/2024)   Hunger Vital Sign    Worried About Running Out of Food in the Last Year: Never true    Ran Out of Food in the Last Year: Never true  Transportation Needs: No Transportation Needs (04/04/2024)   PRAPARE - Administrator, Civil Service (Medical): No    Lack of Transportation (Non-Medical): No  Physical Activity: Insufficiently Active (04/04/2024)   Exercise  Vital Sign    Days of Exercise per Week: 2 days    Minutes of Exercise per Session: 20 min  Stress: No Stress Concern Present (04/04/2024)   [employer] of Occupational Health - Occupational Stress Questionnaire    Feeling of Stress: Only a little  Social Connections: Moderately Integrated (04/04/2024)   Social Connection and Isolation Panel    Frequency of Communication with Friends and Family: More than three times a week    Frequency of Social Gatherings with Friends and Family: More than three times a week    Attends Religious Services: More than 4 times per year    Active Member of [employer] or Organizations: No    Attends Engineer, structural: Never    Marital Status: Married    Tobacco Counseling Counseling given: Not Answered Tobacco comments: quit smoking 39yrs ago    Clinical Intake:  Pre-visit preparation completed: Yes  Pain : No/denies pain Pain Score: 0-No pain     BMI - recorded: 32.4 Nutritional Status: BMI > 30  Obese Nutritional Risks: None Diabetes: Yes CBG done?: No Did pt. bring in CBG monitor from home?: No  Lab Results  Component Value Date   HGBA1C 6.7 (H) 12/19/2023   HGBA1C 7.1 (A) 09/12/2023   HGBA1C 7.8 (H) 01/02/2023     How often do you need to have someone help you when you read instructions, pamphlets, or other written materials from your doctor or pharmacy?: 1 - Never  Interpreter Needed?: No  Information entered by :: JHONNIE DAS, LPN   Activities of Daily Living    04/04/2024    8:25 AM 03/06/2024    9:02 AM  In your present state of health, do you have any difficulty performing the following activities:  Hearing? 0 0  Vision? 0 0  Difficulty concentrating or making decisions? 0 0  Walking or climbing stairs? 0   Dressing  or bathing? 0   Doing errands, shopping? 0   Preparing Food and eating ? N   Using the Toilet? N   In the past six months, have you accidently leaked urine? N   Do you have problems with  loss of bowel control? N   Managing your Medications? N   Managing your Finances? N   Housekeeping or managing your Housekeeping? N     Patient Care Team: Gareth Mliss FALCON, [name] as PCP - General (Nurse Practitioner) Bonner Ade, MD as Consulting Physician (Physical Medicine and Rehabilitation) Tamea Dedra CROME, MD as Consulting Physician (Pulmonary Disease) Leora Lynwood SAUNDERS, MD as Consulting Physician (Orthopedic Surgery) Alana Sharyle LABOR, RPH-CPP as Pharmacist Melanee Annah BROCKS, MD as Consulting Physician (Oncology) Pa, [facility] (Optometry)  I have updated your Care Teams any recent Medical Services you may have received from other providers in the past year.     Assessment:   This is a routine wellness examination for [facility].  Hearing/Vision screen Hearing Screening - Comments:: NO AIDS Vision Screening - Comments:: READERS, HAD CATARACT SGY- [facility]   Goals Addressed             This Visit's Progress    DIET - EAT MORE FRUITS AND VEGETABLES         Depression Screen     04/04/2024    8:21 AM 12/19/2023   10:52 AM 06/29/2023    8:17 AM 06/15/2023   10:11 AM 05/11/2023    1:00 PM 04/03/2023   11:03 AM 03/30/2023    8:27 AM  PHQ 2/9 Scores  PHQ - 2 Score 0 0 0 0 0 0 0  PHQ- 9 Score 0 0 0        Fall Risk     04/04/2024    8:24 AM 12/19/2023   10:52 AM 09/12/2023    9:20 AM 06/29/2023    8:17 AM 06/15/2023   10:10 AM  Fall Risk   Falls in the past year? 1 1 0 1 1  Number falls in past yr: 0 0 0 1 1  Injury with Fall? 1 1 0 1 0  Risk for fall due to :    Impaired balance/gait History of fall(s);Impaired balance/gait;Impaired mobility  Follow up Falls evaluation completed;Falls prevention discussed   Falls prevention discussed;Education provided;Falls evaluation completed Education provided    MEDICARE RISK AT HOME:  Medicare Risk at Home Any stairs in or around the home?: Yes If so, are there any without handrails?: No Home free of loose throw  rugs in walkways, pet beds, electrical cords, etc?: Yes Adequate lighting in your home to reduce risk of falls?: Yes Life alert?: No Use of a cane, walker or w/c?: No Grab bars in the bathroom?: No Shower chair or bench in shower?: No Elevated toilet seat or a handicapped toilet?: No  TIMED UP AND GO:  Was the test performed?  No  Cognitive Function: 6CIT completed        04/04/2024    8:26 AM 03/30/2023    8:43 AM 11/05/2019    9:34 AM 10/30/2018    9:52 AM  6CIT Screen  What Year? 0 points 0 points 0 points 0 points  What month? 0 points 0 points 0 points 0 points  What time? 0 points 0 points 0 points 0 points  Count back from 20 0 points 0 points 0 points 0 points  Months in reverse 0 points 0 points 4 points 0 points  Repeat phrase 0 points 0 points 0 points 2 points  Total Score 0 points 0 points 4 points 2 points    Immunizations Immunization History  Administered Date(s) Administered   Fluad Quad(high Dose 65+) 05/08/2019, 06/07/2021, 05/15/2022   Fluad Trivalent(High Dose 65+) 05/08/2023   Influenza, High Dose Seasonal PF 06/10/2015, 06/20/2016, 05/09/2017, 05/09/2018, 04/17/2020   PFIZER(Purple Top)SARS-COV-2 Vaccination 09/11/2019, 10/02/2019, 06/08/2020, 12/29/2020, 06/23/2022   PNEUMOCOCCAL CONJUGATE-20 06/07/2021   Pfizer Covid-19 Vaccine Bivalent Booster 79yrs & up 07/14/2021, 06/23/2022   Pneumococcal Conjugate-13 11/28/2013   Pneumococcal Polysaccharide-23 03/16/2018   Tdap 05/08/2019    Screening Tests Health Maintenance  Topic Date Due   Zoster Vaccines- Shingrix (1 of 2) Never done   MAMMOGRAM  05/04/2018   COVID-19 Vaccine (7 - 2024-25 season) 04/23/2023   INFLUENZA VACCINE  03/22/2024   HEMOGLOBIN A1C  06/19/2024   OPHTHALMOLOGY EXAM  07/12/2024   FOOT EXAM  08/29/2024   Diabetic kidney evaluation - eGFR measurement  12/18/2024   Diabetic kidney evaluation - Urine ACR  12/18/2024   Medicare Annual Wellness (AWV)  04/04/2025   DTaP/Tdap/Td (2  - Td or Tdap) 05/07/2029   Pneumococcal Vaccine: 50+ Years  Completed   DEXA SCAN  Completed   Hepatitis C Screening  Completed   Hepatitis B Vaccines  Aged Out   HPV VACCINES  Aged Out   Meningococcal B Vaccine  Aged Out   Colonoscopy  Discontinued    Health Maintenance  Health Maintenance Due  Topic Date Due   Zoster Vaccines- Shingrix (1 of 2) Never done   MAMMOGRAM  05/04/2018   COVID-19 Vaccine (7 - 2024-25 season) 04/23/2023   INFLUENZA VACCINE  03/22/2024   Health Maintenance Items Addressed: UP TO DATE ON SHOTS EXCEPT COVID & SHINGRIX; AGED OUT OF MAMMOGRAM & COLONOSCOPY; DECLINES BDS   Additional Screening:  Vision Screening: Recommended annual ophthalmology exams for early detection of glaucoma and other disorders of the eye. Would you like a referral to an eye doctor? No    Dental Screening: Recommended annual dental exams for proper oral hygiene  Community Resource Referral / Chronic Care Management: CRR required this visit?  No   CCM required this visit?  No   Plan:    I have personally reviewed and noted the following in the patient's chart:   Medical and social history Use of alcohol, tobacco or illicit drugs  Current medications and supplements including opioid prescriptions. Patient is currently taking opioid prescriptions. Information provided to patient regarding non-opioid alternatives. Patient advised to discuss non-opioid treatment plan with their provider. Functional ability and status Nutritional status Physical activity Advanced directives List of other physicians Hospitalizations, surgeries, and ER visits in previous 12 months Vitals Screenings to include cognitive, depression, and falls Referrals and appointments  In addition, I have reviewed and discussed with patient certain preventive protocols, quality metrics, and best practice recommendations. A written personalized care plan for preventive services as well as general preventive  health recommendations were provided to patient.   Jhonnie GORMAN Das, LPN   1/85/7974   After Visit Summary: (MyChart) Due to this being a telephonic visit, the after visit summary with patients personalized plan was offered to patient via MyChart   Notes: Nothing significant to report at this time.

## 2024-04-04 NOTE — Patient Instructions (Signed)
 Ms. Popescu , Thank you for taking time out of your busy schedule to complete your Annual Wellness Visit with me. I enjoyed our conversation and look forward to speaking with you again next year. I, as well as your care team,  appreciate your ongoing commitment to your health goals. Please review the following plan we discussed and let me know if I can assist you in the future.  Follow up Visits: 04/10/25 @ 8:10 AM BY PHONE We will see or speak with you next year for your Next Medicare AWV with our clinical staff Have you seen your provider in the last 6 months (3 months if uncontrolled diabetes)? Yes  Clinician Recommendations:  Aim for 30 minutes of exercise or brisk walking, 6-8 glasses of water , and 5 servings of fruits and vegetables each day. TAKE CARE!      This is a list of the screenings recommended for you:  Health Maintenance  Topic Date Due   Zoster (Shingles) Vaccine (1 of 2) Never done   Mammogram  05/04/2018   COVID-19 Vaccine (7 - 2024-25 season) 04/23/2023   Flu Shot  03/22/2024   Hemoglobin A1C  06/19/2024   Eye exam for diabetics  07/12/2024   Complete foot exam   08/29/2024   Yearly kidney function blood test for diabetes  12/18/2024   Yearly kidney health urinalysis for diabetes  12/18/2024   Medicare Annual Wellness Visit  04/04/2025   DTaP/Tdap/Td vaccine (2 - Td or Tdap) 05/07/2029   Pneumococcal Vaccine for age over 54  Completed   DEXA scan (bone density measurement)  Completed   Hepatitis C Screening  Completed   Hepatitis B Vaccine  Aged Out   HPV Vaccine  Aged Out   Meningitis B Vaccine  Aged Out   Colon Cancer Screening  Discontinued    Advanced directives: (Copy Requested) Please bring a copy of your health care power of attorney and living will to the office to be added to your chart at your convenience. You can mail to [facility] [address] [place], [place] [address] or email to [email or web address] Advance Care Planning  is important because it:  [x]  Makes sure you receive the medical care that is consistent with your values, goals, and preferences  [x]  It provides guidance to your family and loved ones and reduces their decisional burden about whether or not they are making the right decisions based on your wishes.  Follow the link provided in your after visit summary or read over the paperwork we have mailed to you to help you started getting your Advance Directives in place. If you need assistance in completing these, please reach out to us  so that we can help you!

## 2024-04-05 ENCOUNTER — Other Ambulatory Visit: Payer: Self-pay | Admitting: Nurse Practitioner

## 2024-04-08 NOTE — Telephone Encounter (Signed)
 Requested Prescriptions  Pending Prescriptions Disp Refills   TRUEplus Lancets 33G MISC [Pharmacy Med Name: TRUEPLUS LANCETS 33G] 200 each 0    Sig: TEST BLOOD SUGAR TWO TIMES DAILY     Endocrinology: Diabetes - Testing Supplies Passed - 04/08/2024  4:18 PM      Passed - Valid encounter within last 12 months    Recent Outpatient Visits           3 weeks ago Shortness of breath   [facility] Gareth Mliss FALCON, [name]   3 months ago Rash   [facility] Gareth Mliss FALCON, [name]   3 months ago Essential (primary) hypertension   [facility] Gareth Mliss FALCON, [name]

## 2024-04-10 ENCOUNTER — Ambulatory Visit: Admitting: Nurse Practitioner

## 2024-04-18 ENCOUNTER — Other Ambulatory Visit: Payer: Self-pay | Admitting: Nurse Practitioner

## 2024-04-18 DIAGNOSIS — I10 Essential (primary) hypertension: Secondary | ICD-10-CM

## 2024-04-19 NOTE — Telephone Encounter (Signed)
 Requested Prescriptions  Pending Prescriptions Disp Refills   losartan  (COZAAR ) 25 MG tablet [Pharmacy Med Name: LOSARTAN  POTASSIUM 25 MG TAB] 90 tablet 0    Sig: TAKE 1 TABLET BY MOUTH EVERY EVENING     Cardiovascular:  Angiotensin Receptor Blockers Failed - 04/19/2024  2:15 PM      Failed - Cr in normal range and within 180 days    Creat  Date Value Ref Range Status  12/19/2023 1.54 (H) 0.60 - 1.00 mg/dL Final   Creatinine, Urine  Date Value Ref Range Status  12/19/2023 95 20 - 275 mg/dL Final         Failed - Last BP in normal range    BP Readings from Last 1 Encounters:  03/20/24 (!) 150/72         Passed - K in normal range and within 180 days    Potassium  Date Value Ref Range Status  12/19/2023 4.0 3.5 - 5.3 mmol/L Final  11/26/2013 3.3 (L) 3.5 - 5.1 mmol/L Final         Passed - Patient is not pregnant      Passed - Valid encounter within last 6 months    Recent Outpatient Visits           1 month ago Shortness of breath   [facility] Gareth Mliss FAL[place], [name]   3 months ago Rash   [facility] Gareth Mliss FALCON, [name]   4 months ago Essential (primary) hypertension   [facility] Gareth Mliss [place], [name]

## 2024-05-01 ENCOUNTER — Inpatient Hospital Stay

## 2024-05-01 ENCOUNTER — Inpatient Hospital Stay: Attending: Oncology | Admitting: Oncology

## 2024-05-03 ENCOUNTER — Other Ambulatory Visit: Payer: Self-pay | Admitting: Nurse Practitioner

## 2024-05-03 DIAGNOSIS — I10 Essential (primary) hypertension: Secondary | ICD-10-CM

## 2024-05-03 NOTE — Telephone Encounter (Signed)
 Requested Prescriptions  Refused Prescriptions Disp Refills   losartan  (COZAAR ) 25 MG tablet [Pharmacy Med Name: LOSARTAN  POTASSIUM 25 MG Oral Tablet] 90 tablet 3    Sig: TAKE 1 TABLET EVERY DAY     Cardiovascular:  Angiotensin Receptor Blockers Failed - 05/03/2024  5:36 PM      Failed - Cr in normal range and within 180 days    Creat  Date Value Ref Range Status  12/19/2023 1.54 (H) 0.60 - 1.00 mg/dL Final   Creatinine, Urine  Date Value Ref Range Status  12/19/2023 95 20 - 275 mg/dL Final         Failed - Last BP in normal range    BP Readings from Last 1 Encounters:  03/20/24 (!) 150/72         Passed - K in normal range and within 180 days    Potassium  Date Value Ref Range Status  12/19/2023 4.0 3.5 - 5.3 mmol/L Final  11/26/2013 3.3 (L) 3.5 - 5.1 mmol/L Final         Passed - Patient is not pregnant      Passed - Valid encounter within last 6 months    Recent Outpatient Visits           1 month ago Shortness of breath   [facility] Gareth Mliss FALCON, [name]   4 months ago Rash   [facility] Gareth Mliss FALCON, [name]   4 months ago Essential (primary) hypertension   [facility] Gareth Mliss [place], [name]

## 2024-05-06 DIAGNOSIS — M5416 Radiculopathy, lumbar region: Secondary | ICD-10-CM | POA: Diagnosis not present

## 2024-05-06 DIAGNOSIS — Z5181 Encounter for therapeutic drug level monitoring: Secondary | ICD-10-CM | POA: Diagnosis not present

## 2024-05-08 ENCOUNTER — Encounter: Payer: Self-pay | Admitting: Nurse Practitioner

## 2024-05-08 ENCOUNTER — Other Ambulatory Visit (HOSPITAL_COMMUNITY)
Admission: RE | Admit: 2024-05-08 | Discharge: 2024-05-08 | Disposition: A | Discharge status: Home / Self Care | Source: Ambulatory Visit | Attending: Nurse Practitioner | Admitting: Nurse Practitioner

## 2024-05-08 ENCOUNTER — Ambulatory Visit (INDEPENDENT_AMBULATORY_CARE_PROVIDER_SITE_OTHER): Admitting: Nurse Practitioner

## 2024-05-08 VITALS — BP 136/78 | HR 67 | Temp 97.7°F | Resp 18 | Ht 63.0 in | Wt 182.6 lb

## 2024-05-08 DIAGNOSIS — R350 Frequency of micturition: Secondary | ICD-10-CM

## 2024-05-08 DIAGNOSIS — K219 Gastro-esophageal reflux disease without esophagitis: Secondary | ICD-10-CM | POA: Diagnosis not present

## 2024-05-08 DIAGNOSIS — R21 Rash and other nonspecific skin eruption: Secondary | ICD-10-CM

## 2024-05-08 DIAGNOSIS — Z23 Encounter for immunization: Secondary | ICD-10-CM | POA: Diagnosis not present

## 2024-05-08 DIAGNOSIS — Z113 Encounter for screening for infections with a predominantly sexual mode of transmission: Secondary | ICD-10-CM | POA: Diagnosis not present

## 2024-05-08 DIAGNOSIS — R102 Pelvic and perineal pain: Secondary | ICD-10-CM | POA: Insufficient documentation

## 2024-05-08 LAB — POCT URINALYSIS DIPSTICK
Appearance: NEGATIVE
Bilirubin, UA: NEGATIVE
Blood, UA: NEGATIVE
Glucose, UA: POSITIVE — AB
Ketones, UA: NEGATIVE
Leukocytes, UA: NEGATIVE
Nitrite, UA: NEGATIVE
Protein, UA: POSITIVE — AB
Spec Grav, UA: 1.02 (ref 1.010–1.025)
Urobilinogen, UA: 0.2 U/dL
pH, UA: 6 (ref 5.0–8.0)

## 2024-05-08 MED ORDER — ESTRADIOL 0.1 MG/GM VA CREA: 1.0000 | TOPICAL_CREAM | VAGINAL | 12 refills | Status: AC

## 2024-05-08 MED ORDER — ESOMEPRAZOLE MAGNESIUM 40 MG PO CPDR: 40.0000 mg | DELAYED_RELEASE_CAPSULE | Freq: Every day | ORAL | 1 refills | Status: DC

## 2024-05-08 MED ORDER — TRIAMCINOLONE ACETONIDE 0.1 % EX OINT: 1.0000 | TOPICAL_OINTMENT | Freq: Two times a day (BID) | CUTANEOUS | 0 refills | Status: AC

## 2024-05-08 NOTE — Progress Notes (Addendum)
 BP 136/78   Pulse 67   Temp 97.7 F (36.5 C)   Resp 18   Ht 5' 3 (1.6 m)   Wt 182 lb 9.6 oz (82.8 kg)   SpO2 97%   BMI 32.35 kg/m    Subjective:    Patient ID: Lori Potts Lower, female    DOB: 1946-05-22, 78 y.o.   MRN: [id number]  HPI: Lori Potts is a 78 y.o. female  Chief Complaint  Patient presents with   Urinary Tract Infection    Onset 3 days frequency, abdominal and back pain    Patient presents with several days of urinary frequency and painful urination. Endorses lower abdominal pain and left sided back pain. Denies hematuria, foul odor, or cloudy urine, but verbalizes that urine appears to be more concentrated. Denies any fever, vomiting, or diarrhea. Patient is not sexually active. Discussed with the patient of collecting a urinalysis, urine culture, and cervicovaginal swab. Patient willing to try estrogen cream, as urinary symptoms may be related to postmenopause.      04/04/2024    8:21 AM 12/19/2023   10:52 AM 06/29/2023    8:17 AM  Depression screen PHQ 2/9  Decreased Interest 0 0 0  Down, Depressed, Hopeless 0 0 0  PHQ - 2 Score 0 0 0  Altered sleeping 0 0 0  Tired, decreased energy 0 0 0  Change in appetite 0 0 0  Feeling bad or failure about yourself  0 0 0  Trouble concentrating 0 0 0  Moving slowly or fidgety/restless 0 0 0  Suicidal thoughts 0 0 0  PHQ-9 Score 0 0 0  Difficult doing work/chores Not difficult at all Not difficult at all Not difficult at all    Relevant past medical, surgical, family and social history reviewed and updated as indicated. Interim medical history since our last visit reviewed. Allergies and medications reviewed and updated.  Review of Systems  Constitutional: Negative for fever or weight change.  Respiratory: Negative for cough and shortness of breath.   Cardiovascular: Negative for chest pain or palpitations.  Gastrointestinal: Endorses lower abdominal pain, no bowel changes.  Musculoskeletal: Negative for  gait problem or joint swelling. Endorses left sided back pain. Skin: Negative for rash.  Neurological: Negative for dizziness or headache.  No other specific complaints in a complete review of systems (except as listed in HPI above).      Objective:     BP 136/78   Pulse 67   Temp 97.7 F (36.5 C)   Resp 18   Ht 5' 3 (1.6 m)   Wt 182 lb 9.6 oz (82.8 kg)   SpO2 97%   BMI 32.35 kg/m    Wt Readings from Last 3 Encounters:  05/08/24 182 lb 9.6 oz (82.8 kg)  03/20/24 180 lb (81.6 kg)  03/18/24 183 lb 6.4 oz (83.2 kg)    Physical Exam Vitals reviewed.  Constitutional:      Appearance: Normal appearance.  HENT:     Head: Normocephalic.  Cardiovascular:     Rate and Rhythm: Normal rate.  Pulmonary:     Effort: Pulmonary effort is normal.  Abdominal:     Tenderness: There is no right CVA tenderness or left CVA tenderness.  Neurological:     General: No focal deficit present.     Mental Status: She is alert and oriented to person, place, and time. Mental status is at baseline.  Psychiatric:        Mood and  Affect: Mood normal.        Behavior: Behavior normal.        Thought Content: Thought content normal.        Judgment: Judgment normal.     Results for orders placed or performed in visit on 05/08/24  POCT Urinalysis Dipstick   Collection Time: 05/08/24 11:03 AM  Result Value Ref Range   Color, UA yellow    Clarity, UA clear    Glucose, UA Positive (A) Negative   Bilirubin, UA neg    Ketones, UA neg    Spec Grav, UA 1.020 1.010 - 1.025   Blood, UA neg    pH, UA 6.0 5.0 - 8.0   Protein, UA Positive (A) Negative   Urobilinogen, UA 0.2 0.2 or 1.0 E.U./dL   Nitrite, UA neg    Leukocytes, UA Negative Negative   Appearance neg    Odor strong           Assessment & Plan:   Problem List Items Addressed This Visit       Digestive   Gastroesophageal reflux disease without esophagitis   Reports she was out of her nexium  and needed a refill. Refill sent       Relevant Medications   esomeprazole  (NEXIUM ) 40 MG capsule   Other Visit Diagnoses       Urinary frequency    -  Primary   Endorses urinary frequency. Urinalysis was negative for nitrates or leukocytes. Urine culture and cervicovaginal swab ordered. Estrogren cream ordered.   Relevant Medications   estradiol  (ESTRACE ) 0.1 MG/GM vaginal cream   Other Relevant Orders   POCT Urinalysis Dipstick (Completed)   Urine Culture   Cervicovaginal ancillary only     Immunization due       Relevant Orders   Flu vaccine HIGH DOSE PF(Fluzone Trivalent) (Completed)     Rash       improved with kenalog  cream requesting refill   Relevant Medications   triamcinolone  ointment (KENALOG ) 0.1 %            Follow up plan: Return if symptoms worsen or fail to improve.    I have reviewed this encounter including the documentation in this note and/or discussed this patient with the provider, Alexa Everhart SNP, I am certifying that I agree with the content of this note as supervising/preceptor nurse practitioner.  Mliss Spray, [name]-C [facility] 05/08/2024, 12:12 PM

## 2024-05-08 NOTE — Assessment & Plan Note (Signed)
 Reports she was out of her nexium  and needed a refill. Refill sent

## 2024-05-09 ENCOUNTER — Ambulatory Visit

## 2024-05-09 ENCOUNTER — Ambulatory Visit: Payer: Self-pay | Admitting: Nurse Practitioner

## 2024-05-09 ENCOUNTER — Ambulatory Visit: Admitting: Nurse Practitioner

## 2024-05-09 DIAGNOSIS — N760 Acute vaginitis: Secondary | ICD-10-CM

## 2024-05-09 DIAGNOSIS — B379 Candidiasis, unspecified: Secondary | ICD-10-CM

## 2024-05-09 LAB — CERVICOVAGINAL ANCILLARY ONLY
Bacterial Vaginitis (gardnerella): POSITIVE — AB
Candida Glabrata: NEGATIVE
Candida Vaginitis: POSITIVE — AB
Chlamydia: NEGATIVE
Comment: NEGATIVE
Comment: NEGATIVE
Comment: NEGATIVE
Comment: NEGATIVE
Comment: NEGATIVE
Comment: NORMAL
Neisseria Gonorrhea: NEGATIVE
Trichomonas: NEGATIVE

## 2024-05-09 MED ORDER — METRONIDAZOLE 500 MG PO TABS: 500.0000 mg | ORAL_TABLET | Freq: Two times a day (BID) | ORAL | 0 refills | Status: AC

## 2024-05-09 MED ORDER — FLUCONAZOLE 150 MG PO TABS: 150.0000 mg | ORAL_TABLET | ORAL | 0 refills | Status: DC | PRN

## 2024-05-10 ENCOUNTER — Ambulatory Visit: Admitting: Nurse Practitioner

## 2024-05-10 LAB — URINE CULTURE
MICRO NUMBER:: 16981509
SPECIMEN QUALITY:: ADEQUATE

## 2024-05-13 ENCOUNTER — Ambulatory Visit: Admitting: Nurse Practitioner

## 2024-05-13 NOTE — Progress Notes (Deleted)
 There were no vitals taken for this visit.   Subjective:    Patient ID: Lori Potts, female    DOB: Sep 21, 1945, 78 y.o.   MRN: [id number]  HPI: Lori Potts is a 78 y.o. female  No chief complaint on file.   Hypertension: -Medications: Losartan  25 mg, Propanolol 20 mg -Patient is compliant with above medications and reports no side effects. -Checking BP at home (average): *** -Highest BP at home: *** -Lowest BP at home: *** -Denies any SOB, CP, vision changes, LE edema or symptoms of hypotension -Diet: *** -Exercise: ***  Diabetes, Type 2:  -Last A1c *** -Medications: Farxiga  5 mg, Dulaglutide  0.75 mg -Patient is compliant with the above medications and reports no side effects. *** -Checking BG at home: *** -Fasting home BG: *** -Post-prandial home BG: *** -Highest home BG since last visit: *** -Lowest home BG since last visit: *** -Diet: *** -Exercise: *** -Eye exam: *** -Foot exam: *** -Microalbumin: *** -Statin: *** -PNA vaccine: *** -Denies symptoms of hypoglycemia, polyuria, polydipsia, numbness extremities, foot ulcers/trauma. ***  HLD: -Medications: Rosuvastatin  40 mg -Patient is compliant with above medications and reports no side effects.  -Last lipid panel: ***  Migraines: -Medications: Propanolol 20 mg, Topiramate  25 mg, Zofran  4 mg PRN -Patient is compliant with above medications and reports no side effects.  GERD: -Medications: Esomeprazole  40 mg, Pepcid  20 mg -Patient is compliant with above medications and reports no side effects.  Depression/Anxiety  Insomnia:       04/04/2024    8:21 AM 12/19/2023   10:52 AM 06/29/2023    8:17 AM  Depression screen PHQ 2/9  Decreased Interest 0 0 0  Down, Depressed, Hopeless 0 0 0  PHQ - 2 Score 0 0 0  Altered sleeping 0 0 0  Tired, decreased energy 0 0 0  Change in appetite 0 0 0  Feeling bad or failure about yourself  0 0 0  Trouble concentrating 0 0 0  Moving slowly or  fidgety/restless 0 0 0  Suicidal thoughts 0 0 0  PHQ-9 Score 0 0 0  Difficult doing work/chores Not difficult at all Not difficult at all Not difficult at all    Relevant past medical, surgical, family and social history reviewed and updated as indicated. Interim medical history since our last visit reviewed. Allergies and medications reviewed and updated.  Review of Systems  Per HPI unless specifically indicated above     Objective:     There were no vitals taken for this visit.  {Vitals History (Optional):23777} Wt Readings from Last 3 Encounters:  05/08/24 82.8 kg  03/20/24 81.6 kg  03/18/24 83.2 kg    Physical Exam  Results for orders placed or performed in visit on 05/08/24  POCT Urinalysis Dipstick   Collection Time: 05/08/24 11:03 AM  Result Value Ref Range   Color, UA yellow    Clarity, UA clear    Glucose, UA Positive (A) Negative   Bilirubin, UA neg    Ketones, UA neg    Spec Grav, UA 1.020 1.010 - 1.025   Blood, UA neg    pH, UA 6.0 5.0 - 8.0   Protein, UA Positive (A) Negative   Urobilinogen, UA 0.2 0.2 or 1.0 E.U./dL   Nitrite, UA neg    Leukocytes, UA Negative Negative   Appearance neg    Odor strong   Cervicovaginal ancillary only   Collection Time: 05/08/24 11:25 AM  Result Value Ref Range   Neisseria  Gonorrhea Negative    Chlamydia Negative    Trichomonas Negative    Bacterial Vaginitis (gardnerella) Positive (A)    Candida Vaginitis Positive (A)    Candida Glabrata Negative    Comment      Normal Reference Range Bacterial Vaginosis - Negative   Comment Normal Reference Range Candida Species - Negative    Comment Normal Reference Range Candida Galbrata - Negative    Comment Normal Reference Range Trichomonas - Negative    Comment Normal Reference Ranger Chlamydia - Negative    Comment      Normal Reference Range Neisseria Gonorrhea - Negative  Urine Culture   Collection Time: 05/08/24 11:36 AM   Specimen: Urine  Result Value Ref Range    MICRO NUMBER: [id number]    SPECIMEN QUALITY: Adequate    Sample Source URINE    STATUS: FINAL    Result:      Mixed genital flora isolated. These superficial bacteria are not indicative of a urinary tract infection. No further organism identification is warranted on this specimen. If clinically indicated, recollect clean-catch, mid-stream urine and transfer  immediately to Urine Culture Transport Tube.    {Labs (Optional):23779}       Assessment & Plan:   Problem List Items Addressed This Visit   None        Follow up plan: No follow-ups on file.

## 2024-05-15 ENCOUNTER — Ambulatory Visit: Admitting: Nurse Practitioner

## 2024-05-15 NOTE — Progress Notes (Deleted)
 There were no vitals taken for this visit.   Subjective:    Patient ID: Lori Potts, female    DOB: [birth year], 78 y.o.   MRN: [id number]  HPI: Lori Potts is a 78 y.o. female  No chief complaint on file.   Hypertension: -Medications: Losartan  25 mg daily, Propranolol  20 mg TID -Patient is compliant with above medications and reports no side effects. -Checking BP at home (average): *** -Highest BP at home: *** -Lowest BP at home: *** -Denies any SOB, CP, vision changes, LE edema or symptoms of hypotension -Diet: *** -Exercise: ***  Diabetes, Type 2: -Last A1c: 6.7 (12/19/2023) -Medications: Dulaglutide  0.75 mg weekly, Farxiga  5 mg daily -Patient is compliant with the above medications and reports no side effects.  -Checking BG at home: *** -Fasting home BG: *** -Post-prandial home BG: *** -Highest home BG since last visit: *** -Lowest home BG since last visit: *** -Eye exam: Completed 07/13/2023 -Foot exam: Completed 08/30/2023 -Microalbumin: Completed 12/19/2023 -Statin: Rosuvastatin  40 mg daily  -PNA vaccine: Completed 06/07/2021 -Denies symptoms of hypoglycemia, polyuria, polydipsia, numbness extremities, foot ulcers/trauma.   HLD: -Medications: Rosuvastatin  40 mg daily  -Patient is compliant with above medications and reports no side effects.  -Last lipid panel: 12/19/2023  Migraines: -Medications: Topamax  25 mg daily, Fioricet 50-325-40 mg PRN -Patient is compliant with above medications and reports no side effects.  -How often?  Asthma: -Medications: Albuterol  inhaler PRN, Breztri  inhaler BID,  -Patient is compliant with above medications and reports no side effects.  -Breathing?  GERD: -Medications: Esomeprazole  40 mg daily, Pepcid  20 mg BID, -Patient is compliant with above medications and reports no side effects.   Anxiety: -Medications: Buspirone  5 mg TID PRN, Citalopram  40 mg daily,  -Patient is compliant with above medications and  reports no side effects. -Anxiety episodes?  Insomnia: -Medications: Hydroxyzine  25 mg PRN -Patient is compliant with above medications and reports no side effects.  -Sleeping?       04/04/2024    8:21 AM 12/19/2023   10:52 AM 06/29/2023    8:17 AM  Depression screen PHQ 2/9  Decreased Interest 0 0 0  Down, Depressed, Hopeless 0 0 0  PHQ - 2 Score 0 0 0  Altered sleeping 0 0 0  Tired, decreased energy 0 0 0  Change in appetite 0 0 0  Feeling bad or failure about yourself  0 0 0  Trouble concentrating 0 0 0  Moving slowly or fidgety/restless 0 0 0  Suicidal thoughts 0 0 0  PHQ-9 Score 0 0 0  Difficult doing work/chores Not difficult at all Not difficult at all Not difficult at all    Relevant past medical, surgical, family and social history reviewed and updated as indicated. Interim medical history since our last visit reviewed. Allergies and medications reviewed and updated.  Review of Systems  Per HPI unless specifically indicated above     Objective:     There were no vitals taken for this visit.  {Vitals History (Optional):23777} Wt Readings from Last 3 Encounters:  05/08/24 82.8 kg  03/20/24 81.6 kg  03/18/24 83.2 kg    Physical Exam  Results for orders placed or performed in visit on 05/08/24  POCT Urinalysis Dipstick   Collection Time: 05/08/24 11:03 AM  Result Value Ref Range   Color, UA yellow    Clarity, UA clear    Glucose, UA Positive (A) Negative   Bilirubin, UA neg    Ketones, UA neg  Spec Grav, UA 1.020 1.010 - 1.025   Blood, UA neg    pH, UA 6.0 5.0 - 8.0   Protein, UA Positive (A) Negative   Urobilinogen, UA 0.2 0.2 or 1.0 E.U./dL   Nitrite, UA neg    Leukocytes, UA Negative Negative   Appearance neg    Odor strong   Cervicovaginal ancillary only   Collection Time: 05/08/24 11:25 AM  Result Value Ref Range   Neisseria Gonorrhea Negative    Chlamydia Negative    Trichomonas Negative    Bacterial Vaginitis (gardnerella) Positive  (A)    Candida Vaginitis Positive (A)    Candida Glabrata Negative    Comment      Normal Reference Range Bacterial Vaginosis - Negative   Comment Normal Reference Range Candida Species - Negative    Comment Normal Reference Range Candida Galbrata - Negative    Comment Normal Reference Range Trichomonas - Negative    Comment Normal Reference Ranger Chlamydia - Negative    Comment      Normal Reference Range Neisseria Gonorrhea - Negative  Urine Culture   Collection Time: 05/08/24 11:36 AM   Specimen: Urine  Result Value Ref Range   MICRO NUMBER: [id number]    SPECIMEN QUALITY: Adequate    Sample Source URINE    STATUS: FINAL    Result:      Mixed genital flora isolated. These superficial bacteria are not indicative of a urinary tract infection. No further organism identification is warranted on this specimen. If clinically indicated, recollect clean-catch, mid-stream urine and transfer  immediately to Urine Culture Transport Tube.    {Labs (Optional):23779}       Assessment & Plan:   Problem List Items Addressed This Visit   None        Follow up plan: No follow-ups on file.

## 2024-05-22 ENCOUNTER — Encounter: Payer: Self-pay | Admitting: Oncology

## 2024-05-22 ENCOUNTER — Inpatient Hospital Stay: Attending: Oncology | Admitting: Oncology

## 2024-05-22 ENCOUNTER — Inpatient Hospital Stay

## 2024-05-22 VITALS — BP 124/66 | HR 65 | Temp 97.0°F | Resp 19 | Ht 63.0 in | Wt 183.3 lb

## 2024-05-22 DIAGNOSIS — R779 Abnormality of plasma protein, unspecified: Secondary | ICD-10-CM | POA: Insufficient documentation

## 2024-05-22 DIAGNOSIS — D509 Iron deficiency anemia, unspecified: Secondary | ICD-10-CM | POA: Diagnosis present

## 2024-05-22 DIAGNOSIS — R778 Other specified abnormalities of plasma proteins: Secondary | ICD-10-CM

## 2024-05-22 DIAGNOSIS — N183 Chronic kidney disease, stage 3 unspecified: Secondary | ICD-10-CM | POA: Diagnosis not present

## 2024-05-22 DIAGNOSIS — G8929 Other chronic pain: Secondary | ICD-10-CM | POA: Diagnosis not present

## 2024-05-22 DIAGNOSIS — I129 Hypertensive chronic kidney disease with stage 1 through stage 4 chronic kidney disease, or unspecified chronic kidney disease: Secondary | ICD-10-CM | POA: Insufficient documentation

## 2024-05-22 DIAGNOSIS — Z79899 Other long term (current) drug therapy: Secondary | ICD-10-CM | POA: Diagnosis not present

## 2024-05-22 DIAGNOSIS — M549 Dorsalgia, unspecified: Secondary | ICD-10-CM | POA: Insufficient documentation

## 2024-05-22 DIAGNOSIS — E1122 Type 2 diabetes mellitus with diabetic chronic kidney disease: Secondary | ICD-10-CM | POA: Diagnosis not present

## 2024-05-22 DIAGNOSIS — Z87891 Personal history of nicotine dependence: Secondary | ICD-10-CM | POA: Diagnosis not present

## 2024-05-22 LAB — CBC WITH DIFFERENTIAL/PLATELET
Abs Immature Granulocytes: 0.02 K/uL (ref 0.00–0.07)
Basophils Absolute: 0 K/uL (ref 0.0–0.1)
Basophils Relative: 1 %
Eosinophils Absolute: 0.2 K/uL (ref 0.0–0.5)
Eosinophils Relative: 4 %
HCT: 32.5 % — ABNORMAL LOW (ref 36.0–46.0)
Hemoglobin: 10.1 g/dL — ABNORMAL LOW (ref 12.0–15.0)
Immature Granulocytes: 0 %
Lymphocytes Relative: 19 %
Lymphs Abs: 1.1 K/uL (ref 0.7–4.0)
MCH: 21.4 pg — ABNORMAL LOW (ref 26.0–34.0)
MCHC: 31.1 g/dL (ref 30.0–36.0)
MCV: 68.9 fL — ABNORMAL LOW (ref 80.0–100.0)
Monocytes Absolute: 0.4 K/uL (ref 0.1–1.0)
Monocytes Relative: 6 %
Neutro Abs: 4 K/uL (ref 1.7–7.7)
Neutrophils Relative %: 70 %
Platelets: 435 K/uL — ABNORMAL HIGH (ref 150–400)
RBC: 4.72 MIL/uL (ref 3.87–5.11)
RDW: 20.6 % — ABNORMAL HIGH (ref 11.5–15.5)
WBC: 5.7 K/uL (ref 4.0–10.5)
nRBC: 0 % (ref 0.0–0.2)

## 2024-05-22 LAB — COMPREHENSIVE METABOLIC PANEL WITH GFR
ALT: 15 U/L (ref 0–44)
AST: 20 U/L (ref 15–41)
Albumin: 3.7 g/dL (ref 3.5–5.0)
Alkaline Phosphatase: 59 U/L (ref 38–126)
Anion gap: 7 (ref 5–15)
BUN: 19 mg/dL (ref 8–23)
CO2: 27 mmol/L (ref 22–32)
Calcium: 9.5 mg/dL (ref 8.9–10.3)
Chloride: 105 mmol/L (ref 98–111)
Creatinine, Ser: 1.16 mg/dL — ABNORMAL HIGH (ref 0.44–1.00)
GFR, Estimated: 48 mL/min — ABNORMAL LOW (ref >60)
Glucose, Bld: 108 mg/dL — ABNORMAL HIGH (ref 70–99)
Potassium: 4.2 mmol/L (ref 3.5–5.1)
Sodium: 139 mmol/L (ref 135–145)
Total Bilirubin: 0.5 mg/dL (ref 0.0–1.2)
Total Protein: 7 g/dL (ref 6.5–8.1)

## 2024-05-22 LAB — IRON AND TIBC
Iron: 54 ug/dL (ref 28–170)
Saturation Ratios: 13 % (ref 10.4–31.8)
TIBC: 427 ug/dL (ref 250–450)
UIBC: 373 ug/dL

## 2024-05-22 LAB — FERRITIN: Ferritin: 8 ng/mL — ABNORMAL LOW (ref 11–307)

## 2024-05-22 LAB — RETICULOCYTES
Immature Retic Fract: 11 % (ref 2.3–15.9)
RBC.: 4.65 MIL/uL (ref 3.87–5.11)
Retic Count, Absolute: 43.7 K/uL (ref 19.0–186.0)
Retic Ct Pct: 0.9 % (ref 0.4–3.1)

## 2024-05-22 LAB — TSH: TSH: 1.199 u[IU]/mL (ref 0.350–4.500)

## 2024-05-22 LAB — LACTATE DEHYDROGENASE: LDH: 174 U/L (ref 98–192)

## 2024-05-22 LAB — VITAMIN B12: Vitamin B-12: 222 pg/mL (ref 180–914)

## 2024-05-22 LAB — FOLATE: Folate: 12.6 ng/mL (ref >5.9)

## 2024-05-22 NOTE — Progress Notes (Signed)
 New patient referred for abnormal SPEP.

## 2024-05-22 NOTE — Progress Notes (Signed)
 Hematology/Oncology Consult note [facility] Telephone:([phone] [phone]  Patient Care Team: Gareth Mliss FALCON, [name] as PCP - General (Nurse Practitioner) Bonner Ade, MD as Consulting Physician (Physical Medicine and Rehabilitation) Tamea Dedra CROME, MD as Consulting Physician (Pulmonary Disease) Leora Lynwood SAUNDERS, MD as Consulting Physician (Orthopedic Surgery) Alana Sharyle LABOR, RPH-CPP as Pharmacist Melanee Annah BROCKS, MD as Consulting Physician (Oncology) Pa, [facility])   Name of the patient: Lori Potts  [id number]  09/09/1945    Reason for referral-abnormal SPEP   Referring physician-Julie Gareth, [name]  Date of visit: 05/22/24   History of presenting illness-patient is a 78 year old female with a past medical history significant for stage III CKD, hypertension hyperlipidemia type 2 diabetes asthma osteoarthritis among other medical problems.  She has been referred For abnormal SPEP.  CBC from 03/11/2024 showed H&H of 10/23.1 with an MCV of 72.3 and a platelet count of 518.  White count was normal at 5.1.  CMP was significant for creatinine of 1.1.  Electrolytes were normal and calcium  normal at 9.7.  UPEP showed possible abnormal protein band which may represent beta-2 microglobulin monoclonal immunoglobulin or free light chain.  SPEP showed possible M spike in the gamma globulin region.Looking back at patient's prior CBCs patient was noted to have a normal hemoglobin around 12 up until October 2023 and since then her hemoglobin has been fluctuating around 10.  Patient reports ongoing fatigue and she has chronic back pain from prior back surgeries.  Denies any changes in her appetite or weight.  ECOG PS- 1  Pain scale- 3   Review of systems- Review of Systems  Constitutional:  Positive for malaise/fatigue. Negative for chills, fever and weight loss.  HENT:  Negative for congestion, ear discharge and nosebleeds.    Eyes:  Negative for blurred vision.  Respiratory:  Negative for cough, hemoptysis, sputum production, shortness of breath and wheezing.   Cardiovascular:  Negative for chest pain, palpitations, orthopnea and claudication.  Gastrointestinal:  Negative for abdominal pain, blood in stool, constipation, diarrhea, heartburn, melena, nausea and vomiting.  Genitourinary:  Negative for dysuria, flank pain, frequency, hematuria and urgency.  Musculoskeletal:  Positive for back pain. Negative for joint pain and myalgias.  Skin:  Negative for rash.  Neurological:  Negative for dizziness, tingling, focal weakness, seizures, weakness and headaches.  Endo/Heme/Allergies:  Does not bruise/bleed easily.  Psychiatric/Behavioral:  Negative for depression and suicidal ideas. The patient does not have insomnia.     Allergies  Allergen Reactions   Metformin And Related Diarrhea   Adhesive [Tape] Rash and Other (See Comments)    Regular tape is ok, allergy is to paper tape   Wound Dressing Adhesive Dermatitis and Other (See Comments)    Regular tape is ok, allergy is to paper tape    Patient Active Problem List   Diagnosis Date Noted   Primary osteoarthritis of right hip 09/27/2023   DDD (degenerative disc disease), cervical 02/28/2023   S/P lumbar fusion 01/30/2023   Arthralgia of both knees 12/21/2022   Stage 3a chronic kidney disease (HCC) 09/15/2022   OA (osteoarthritis) of hip 06/08/2022   Primary osteoarthritis of left hip 06/08/2022   Anxiety 04/07/2022   Palpitations 04/07/2022   Mild episode of recurrent major depressive disorder 04/07/2022   Moderate persistent asthma without complication 02/16/2022   Intractable migraine with aura without status migrainosus 04/18/2020   Body mass index (BMI) 31.0-31.9, adult 02/06/2020   Chronic left hip pain 12/26/2019  Anemia 09/22/2017   S/P knee replacement 07/28/2017   Allergic rhinitis 05/26/2017   Vitamin D  deficiency 05/19/2017   Chronic  nonintractable headache 05/18/2017   Advanced care planning/counseling discussion    Gastroesophageal reflux disease without esophagitis    Calcification of aorta 03/02/2017   Insomnia 11/29/2016   Abdominal wall pain in right flank 03/29/2016   Diverticulosis 02/24/2016   Type 2 diabetes mellitus with microalbuminuria, without long-term current use of insulin  (HCC) 06/16/2015   Hyperlipidemia associated with type 2 diabetes mellitus (HCC) 06/16/2015   Chronic pain 04/13/2015   Neck pain 02/26/2015   Status post lumbar surgery 02/26/2015   Abnormal ECG 01/27/2015   Essential (primary) hypertension 01/27/2015     Past Medical History:  Diagnosis Date   Anxiety    Arthritis    Asthma    Calcification of aorta 03/02/2017   Cataract    right eye but immature   Dyspnea    Essential hypertension, benign    takes Lisinopril -HCTZ daily   GERD (gastroesophageal reflux disease)    Headache    sinus   History of anemia due to chronic kidney disease    History of colon polyps    benign   History of migraine    History of shingles    Hyperlipidemia    takes Lipitor daily   Low back pain    Nocturia    S/P insertion of spinal cord stimulator    Seasonal allergies    takes Singulair  daily as needed   Type II or unspecified type diabetes mellitus without mention of complication, not stated as uncontrolled    Vitamin D  deficiency    takes Vit D weekly   Weakness    numbness and tingling left arm   Wears dentures    full upper and lower     Past Surgical History:  Procedure Laterality Date   ABDOMINAL HYSTERECTOMY     ANTERIOR CERVICAL DECOMP/DISCECTOMY FUSION N/A 02/26/2015   Procedure: ANTERIOR CERVICAL DISCECTOMY FUSION C4-5 (1 LEVEL);  Surgeon: Donaciano Sprang, MD;  Location[facility] OR;  Service: Orthopedics;  Laterality: N/A;   ANTERIOR LAT LUMBAR FUSION N/A 03/21/2013   Procedure: ANTERIOR LATERAL LUMBAR FUSION 1 LEVEL/ XLIF L3-L4 ;  Surgeon: Donaciano Sprang, MD;  Location: [facility] OR;   Service: Orthopedics;  Laterality: N/A;   ANTERIOR LATERAL LUMBAR FUSION WITH PERCUTANEOUS SCREW 1 LEVEL N/A 01/30/2023   Procedure: LATERAL INTERBODY FUSION LUMBAR TWO TO THREE;  Surgeon: Sprang Donaciano, MD;  Location: [facility] OR;  Service: Orthopedics;  Laterality: N/A;  5 HRS 3 C-BED LEFT TAP BLOCK WITH EXPAREL    APPENDECTOMY     AUGMENTATION MAMMAPLASTY Bilateral 1978   BACK SURGERY     BACK SURGERY     Lumbar fusion x 2   BREAST EXCISIONAL BIOPSY Right 1970   BREAST SURGERY  1990   Augementation   CARDIAC CATHETERIZATION  5/12   ef 55%   CARDIAC CATHETERIZATION  10/2010   [facility]; EF 55%   CARDIAC CATHETERIZATION Left 02/16/2016   Procedure: Left Heart Cath and Coronary Angiography;  Surgeon: Cara JONETTA Lovelace, MD;  Location: [facility] INVASIVE CV LAB;  Service: Cardiovascular;  Laterality: Left;   CATARACT EXTRACTION W/PHACO Right 03/06/2024   Procedure: PHACOEMULSIFICATION, CATARACT, WITH IOL INSERTION 6.60 00:44.6;  Surgeon: Mittie Gaskin, MD;  Location: [facility] ORS;  Service: Ophthalmology;  Laterality: Right;   CATARACT EXTRACTION W/PHACO Left 03/20/2024   Procedure: PHACOEMULSIFICATION, CATARACT, WITH IOL INSERTION 7.53 00:55.7;  Surgeon: Mittie Gaskin, MD;  Location:  [facility];  Service: Ophthalmology;  Laterality: Left;   COLONOSCOPY     COLONOSCOPY WITH PROPOFOL  N/A 04/10/2017   Procedure: COLONOSCOPY WITH PROPOFOL ;  Surgeon: Jinny Carmine, MD;  Location: [facility];  Service: Gastroenterology;  Laterality: N/A;  Diabetic - insulin    ESOPHAGOGASTRODUODENOSCOPY     JOINT REPLACEMENT     KIDNEY SURGERY  1998   growth removed from left kidney    LEFT HEART CATH AND CORONARY ANGIOGRAPHY Left 09/10/2018   Procedure: LEFT HEART CATH AND CORONARY ANGIOGRAPHY;  Surgeon: Florencio Cara BIRCH, MD;  Location: [facility] INVASIVE CV LAB;  Service: Cardiovascular;  Laterality: Left;   pain stimulator     POSTERIOR CERVICAL FUSION/FORAMINOTOMY Right 03/21/2013   Procedure:  POSTERIOR L2-3 RIGHT FORAMINOTOMY;  Surgeon: Donaciano Sprang, MD;  Location: [facility] OR;  Service: Orthopedics;  Laterality: Right;   REVISION OF SCAR TISSUE RECTUS MUSCLE     SHOULDER ARTHROSCOPY WITH ROTATOR CUFF REPAIR AND SUBACROMIAL DECOMPRESSION Right 09/17/2020   Procedure: RIGHT SHOULDER ARTHROSCOPY WITH MINI-OPEN ROTATOR CUFF REPAIR, DISTAL CLAVICLE EXCISION, AND SUBACROMIAL DECOMPRESSION WITH BICEP TENDONESIS;  Surgeon: Marchia Drivers, MD;  Location: [facility] ORS;  Service: Orthopedics;  Laterality: Right;   SMALL BOWEL REPAIR     SPINAL CORD STIMULATOR BATTERY EXCHANGE N/A 10/17/2012   Procedure: SPINAL CORD STIMULATOR BATTERY REMOVAL;  Surgeon: Donaciano Sprang, MD;  Location: [facility] OR;  Service: Orthopedics;  Laterality: N/A;   SPINAL CORD STIMULATOR BATTERY EXCHANGE N/A 07/22/2015   Procedure: REIMPLANTATION OF SPINAL CORD STIMULATOR BATTERY ;  Surgeon: Donaciano Sprang, MD;  Location: [facility] OR;  Service: Orthopedics;  Laterality: N/A;   TOTAL HIP ARTHROPLASTY Left 06/08/2022   Procedure: TOTAL HIP ARTHROPLASTY ANTERIOR APPROACH;  Surgeon: Melodi Lerner, MD;  Location: [facility] ORS;  Service: Orthopedics;  Laterality: Left;   TOTAL HIP ARTHROPLASTY Right 09/27/2023   Procedure: TOTAL HIP ARTHROPLASTY ANTERIOR APPROACH;  Surgeon: Melodi Lerner, MD;  Location: [facility] ORS;  Service: Orthopedics;  Laterality: Right;   TOTAL KNEE ARTHROPLASTY Left    TOTAL KNEE ARTHROPLASTY Right 07/28/2017   Procedure: RIGHT TOTAL KNEE ARTHROPLASTY;  Surgeon: Gerome Charleston, MD;  Location: [facility] ORS;  Service: Orthopedics;  Laterality: Right;    Social History   Socioeconomic History   Marital status: Married    Spouse name: Elsie   Number of children: 3   Years of education: Not on file   Highest education level: Associate degree: occupational, Scientist, product/process development, or vocational program  Occupational History   Not on file  Tobacco Use   Smoking status: Former    Current packs/day: 0.00    Types: Cigarettes    Quit date: 2006     Years since quitting: 19.7   Smokeless tobacco: Never   Tobacco comments:    quit smoking 78yrs ago  Vaping Use   Vaping status: Never Used  Substance and Sexual Activity   Alcohol use: No   Drug use: No   Sexual activity: Yes    Partners: Male    Birth control/protection: Surgical  Other Topics Concern   Not on file  Social History Narrative   Not on file   Social Drivers of Health   Financial Resource Strain: Low Risk  (04/04/2024)   Overall Financial Resource Strain (CARDIA)    Difficulty of Paying Living Expenses: Not very hard  Food Insecurity: No Food Insecurity (04/04/2024)   Hunger Vital Sign    Worried About Running Out of Food in the Last Year: Never true    Ran Out of Food  in the Last Year: Never true  Transportation Needs: No Transportation Needs (04/04/2024)   PRAPARE - Administrator, Civil Service (Medical): No    Lack of Transportation (Non-Medical): No  Physical Activity: Insufficiently Active (04/04/2024)   Exercise Vital Sign    Days of Exercise per Week: 2 days    Minutes of Exercise per Session: 20 min  Stress: No Stress Concern Present (04/04/2024)   [employer] of Occupational Health - Occupational Stress Questionnaire    Feeling of Stress: Only a little  Social Connections: Moderately Integrated (04/04/2024)   Social Connection and Isolation Panel    Frequency of Communication with Friends and Family: More than three times a week    Frequency of Social Gatherings with Friends and Family: More than three times a week    Attends Religious Services: More than 4 times per year    Active Member of [employer] or Organizations: No    Attends Banker Meetings: Never    Marital Status: Married  Catering manager Violence: Not At Risk (04/04/2024)   Humiliation, Afraid, Rape, and Kick questionnaire    Fear of Current or Ex-Partner: No    Emotionally Abused: No    Physically Abused: No    Sexually Abused: No     Family History   Problem Relation Age of Onset   Heart attack Mother    Sarcoidosis Sister      Current Outpatient Medications:    albuterol  (VENTOLIN  HFA) 108 (90 Base) MCG/ACT inhaler, Inhale 2 puffs into the lungs every 6 (six) hours as needed for wheezing or shortness of breath., Disp: 18 g, Rfl: 2   Alcohol Swabs  (DROPSAFE ALCOHOL PREP) 70 % PADS, USE TO TEST BLOOD SUGAR TWO TIMES DAILY, Disp: 200 each, Rfl: 3   aspirin  81 MG chewable tablet, CHEW 1 TABLET BY MOUTH 2 (TWO) TIMES DAILY FOR 20 DAYS THEN ONCE A DAY, Disp: , Rfl:    atorvastatin  (LIPITOR) 80 MG tablet, Take 80 mg by mouth daily., Disp: , Rfl:    Blood Glucose Monitoring Suppl (TRUE METRIX AIR GLUCOSE METER) w/Device KIT, USE AS DIRECTED, Disp: 1 kit, Rfl: 3   budesonide -glycopyrrolate -formoterol  (BREZTRI  AEROSPHERE) 160-9-4.8 MCG/ACT AERO inhaler, Inhale 2 puffs into the lungs 2 (two) times daily., Disp: 10.7 g, Rfl: 5   busPIRone  (BUSPAR ) 5 MG tablet, TAKE 1 TABLET THREE TIMES DAILY AS NEEDED, Disp: 270 tablet, Rfl: 3   butalbital -acetaminophen -caffeine  (FIORICET) 50-325-40 MG tablet, Take 1 tablet by mouth every 6 (six) hours as needed for headache., Disp: 14 tablet, Rfl: 0   citalopram  (CELEXA ) 40 MG tablet, TAKE 1 TABLET EVERY DAY, Disp: 90 tablet, Rfl: 1   Continuous Glucose Sensor (FREESTYLE LIBRE 3 SENSOR) MISC, 1 Device by Does not apply route every 14 (fourteen) days., Disp: 2 each, Rfl: 11   Dulaglutide  (TRULICITY ) 0.75 MG/0.5ML SOPN, Inject 0.75 mg into the skin once a week. Patient receives via [employer] Patient Assistance through Dec 2023, Disp: 6 mL, Rfl: 1   esomeprazole  (NEXIUM ) 40 MG capsule, Take 1 capsule (40 mg total) by mouth at bedtime., Disp: 90 capsule, Rfl: 1   estradiol  (ESTRACE ) 0.1 MG/GM vaginal cream, Place 1 Applicatorful vaginally 3 (three) times a week., Disp: 42.5 g, Rfl: 12   famotidine  (PEPCID ) 20 MG tablet, TAKE 1 TABLET BY MOUTH TWICE A DAY, Disp: 180 tablet, Rfl: 0   FARXIGA  5 MG TABS tablet, TAKE 1  TABLET BY MOUTH DAILY BEFORE BREAKFAST., Disp: 90 tablet, Rfl: 1  fluconazole  (DIFLUCAN ) 150 MG tablet, Take 1 tablet (150 mg total) by mouth every 3 (three) days as needed (for vaginal itching/yeast infection sx)., Disp: 2 tablet, Rfl: 0   hydrOXYzine  (VISTARIL ) 25 MG capsule, TAKE 1 CAPSULE EVERY 8 HOURS AS NEEDED, Disp: 30 capsule, Rfl: 0   losartan  (COZAAR ) 25 MG tablet, TAKE 1 TABLET BY MOUTH EVERY EVENING, Disp: 90 tablet, Rfl: 0   meclizine  (ANTIVERT ) 25 MG tablet, TAKE 1 TABLET BY MOUTH 3 TIMES DAILY AS NEEDED FOR DIZZINESS., Disp: 90 tablet, Rfl: 0   methocarbamol  (ROBAXIN ) 500 MG tablet, Take 1 tablet (500 mg total) by mouth every 6 (six) hours as needed for muscle spasms., Disp: 40 tablet, Rfl: 0   montelukast  (SINGULAIR ) 10 MG tablet, Take 1 tablet (10 mg total) by mouth at bedtime., Disp: 90 tablet, Rfl: 1   ondansetron  (ZOFRAN ) 4 MG tablet, Take 1 tablet (4 mg total) by mouth every 6 (six) hours as needed for nausea., Disp: 20 tablet, Rfl: 0   ondansetron  (ZOFRAN -ODT) 4 MG disintegrating tablet, Take 1 tablet (4 mg total) by mouth every 8 (eight) hours as needed for nausea or vomiting., Disp: 20 tablet, Rfl: 0   oxyCODONE -acetaminophen  (PERCOCET) 10-325 MG tablet, Take 1 tablet by mouth every 6 (six) hours as needed for pain., Disp: , Rfl:    propranolol  (INDERAL ) 20 MG tablet, TAKE 1 TABLET BY MOUTH THREE TIMES A DAY, Disp: 270 tablet, Rfl: 1   rosuvastatin  (CRESTOR ) 40 MG tablet, TAKE 1 TABLET AT BEDTIME, Disp: 90 tablet, Rfl: 3   topiramate  (TOPAMAX ) 25 MG tablet, Take 1 tablet (25 mg total) by mouth at bedtime., Disp: 90 tablet, Rfl: 1   triamcinolone  ointment (KENALOG ) 0.1 %, Apply 1 Application topically 2 (two) times daily. To affected areas, Disp: 60 g, Rfl: 0   TRUEplus Lancets 33G MISC, TEST BLOOD SUGAR TWO TIMES DAILY, Disp: 200 each, Rfl: 0   Physical exam:  Vitals:   05/22/24 1114  BP: 124/66  Pulse: 65  Resp: 19  Temp: (!) 97 F (36.1 C)  TempSrc: Tympanic   SpO2: 99%  Weight: 183 lb 4.8 oz (83.1 kg)  Height: 5' 3 (1.6 m)   Physical Exam Cardiovascular:     Rate and Rhythm: Normal rate and regular rhythm.     Heart sounds: Normal heart sounds.  Pulmonary:     Effort: Pulmonary effort is normal.     Breath sounds: Normal breath sounds.  Abdominal:     General: Bowel sounds are normal.     Palpations: Abdomen is soft.  Skin:    General: Skin is warm and dry.  Neurological:     Mental Status: She is alert and oriented to person, place, and time.           Latest Ref Rng & Units 12/19/2023   11:11 AM  CMP  Glucose 65 - 99 mg/dL 863   BUN 7 - 25 mg/dL 19   Creatinine 9.39 - 1.00 mg/dL 8.45   Sodium 864 - 853 mmol/L 141   Potassium 3.5 - 5.3 mmol/L 4.0   Chloride 98 - 110 mmol/L 109   CO2 20 - 32 mmol/L 24   Calcium  8.6 - 10.4 mg/dL 9.7   Total Protein 6.1 - 8.1 g/dL 6.6   Total Bilirubin 0.2 - 1.2 mg/dL 0.3   AST 10 - 35 U/L 19   ALT 6 - 29 U/L 12       Latest Ref Rng & Units 12/19/2023  11:11 AM  CBC  WBC 3.8 - 10.8 Thousand/uL 6.4   Hemoglobin 11.7 - 15.5 g/dL 89.6   Hematocrit 64.9 - 45.0 % 33.6   Platelets 140 - 400 Thousand/uL 507     Assessment and plan- Patient is a 78 y.o. female referred for abnormal SPEP  Patient baseline hemoglobin was around 12 up until October 2023 and then since then it has gradually drifted down to the tens.  She also has evidence of microcytosis.  I am doing a complete anemia workup including ferritin and iron studies B12 folate Reticulocyte count and TSH today.  Patient was noted to have possible abnormal M protein on her SPEP and UPEP which I am going to repeat along with 24-hour urine protein electrophoresis and free and serum free light chains.  I will see her back in 2 weeks to discuss the results of today's blood work   Thank you for this kind referral and the opportunity to participate in the care of this patient   Visit Diagnosis 1. Abnormal SPEP   2. Microcytic anemia      Dr. Annah Skene, MD, MPH [facility] at [facility] [phone] 05/22/2024

## 2024-05-23 LAB — KAPPA/LAMBDA LIGHT CHAINS
Kappa free light chain: 20.1 mg/L — ABNORMAL HIGH (ref 3.3–19.4)
Kappa, lambda light chain ratio: 1.42 (ref 0.26–1.65)
Lambda free light chains: 14.2 mg/L (ref 5.7–26.3)

## 2024-05-25 LAB — MULTIPLE MYELOMA PANEL, SERUM
Albumin SerPl Elph-Mcnc: 3.4 g/dL (ref 2.9–4.4)
Albumin/Glob SerPl: 1.1 (ref 0.7–1.7)
Alpha 1: 0.3 g/dL (ref 0.0–0.4)
Alpha2 Glob SerPl Elph-Mcnc: 1 g/dL (ref 0.4–1.0)
B-Globulin SerPl Elph-Mcnc: 1.2 g/dL (ref 0.7–1.3)
Gamma Glob SerPl Elph-Mcnc: 0.8 g/dL (ref 0.4–1.8)
Globulin, Total: 3.3 g/dL (ref 2.2–3.9)
IgA: 211 mg/dL (ref 64–422)
IgG (Immunoglobin G), Serum: 914 mg/dL (ref 586–1602)
IgM (Immunoglobulin M), Srm: 37 mg/dL (ref 26–217)
M Protein SerPl Elph-Mcnc: 0.3 g/dL — ABNORMAL HIGH
Total Protein ELP: 6.7 g/dL (ref 6.0–8.5)

## 2024-05-27 ENCOUNTER — Other Ambulatory Visit: Payer: Self-pay

## 2024-05-27 DIAGNOSIS — R778 Other specified abnormalities of plasma proteins: Secondary | ICD-10-CM

## 2024-05-27 DIAGNOSIS — Z79899 Other long term (current) drug therapy: Secondary | ICD-10-CM | POA: Diagnosis not present

## 2024-05-27 DIAGNOSIS — G8929 Other chronic pain: Secondary | ICD-10-CM | POA: Diagnosis not present

## 2024-05-27 DIAGNOSIS — D509 Iron deficiency anemia, unspecified: Secondary | ICD-10-CM

## 2024-05-27 DIAGNOSIS — I129 Hypertensive chronic kidney disease with stage 1 through stage 4 chronic kidney disease, or unspecified chronic kidney disease: Secondary | ICD-10-CM | POA: Diagnosis not present

## 2024-05-27 DIAGNOSIS — N183 Chronic kidney disease, stage 3 unspecified: Secondary | ICD-10-CM | POA: Diagnosis not present

## 2024-05-27 DIAGNOSIS — R779 Abnormality of plasma protein, unspecified: Secondary | ICD-10-CM | POA: Diagnosis not present

## 2024-05-27 DIAGNOSIS — M549 Dorsalgia, unspecified: Secondary | ICD-10-CM | POA: Diagnosis not present

## 2024-05-27 DIAGNOSIS — Z87891 Personal history of nicotine dependence: Secondary | ICD-10-CM | POA: Diagnosis not present

## 2024-05-31 ENCOUNTER — Other Ambulatory Visit: Payer: Self-pay

## 2024-05-31 DIAGNOSIS — J45909 Unspecified asthma, uncomplicated: Secondary | ICD-10-CM

## 2024-05-31 LAB — IFE+PROTEIN ELECTRO, 24-HR UR
% BETA, Urine: 29.1 %
ALPHA 1 URINE: 6.7 %
Albumin, U: 27.1 %
Alpha 2, Urine: 24.8 %
GAMMA GLOBULIN URINE: 12.3 %
Total Protein, Urine-Ur/day: 1080 mg/(24.h) — ABNORMAL HIGH (ref 30–150)
Total Protein, Urine: 43.2 mg/dL
Total Volume: 2500

## 2024-05-31 MED ORDER — BREZTRI AEROSPHERE 160-9-4.8 MCG/ACT IN AERO: 2.0000 | INHALATION_SPRAY | Freq: Two times a day (BID) | RESPIRATORY_TRACT | 5 refills | Status: DC

## 2024-06-04 ENCOUNTER — Ambulatory Visit: Admitting: Nurse Practitioner

## 2024-06-05 ENCOUNTER — Other Ambulatory Visit: Payer: Self-pay | Admitting: Emergency Medicine

## 2024-06-10 ENCOUNTER — Telehealth: Payer: Self-pay

## 2024-06-10 NOTE — Telephone Encounter (Signed)
 PAP for trulicity  by lilly. Mailed out pt portion today and will fax provider portion

## 2024-06-12 ENCOUNTER — Encounter: Payer: Self-pay | Admitting: Oncology

## 2024-06-12 ENCOUNTER — Inpatient Hospital Stay (HOSPITAL_BASED_OUTPATIENT_CLINIC_OR_DEPARTMENT_OTHER): Admitting: Oncology

## 2024-06-12 ENCOUNTER — Telehealth: Payer: Self-pay

## 2024-06-12 VITALS — BP 147/73 | HR 62 | Temp 97.8°F | Resp 19 | Ht 63.0 in | Wt 179.6 lb

## 2024-06-12 DIAGNOSIS — R778 Other specified abnormalities of plasma proteins: Secondary | ICD-10-CM

## 2024-06-12 DIAGNOSIS — R779 Abnormality of plasma protein, unspecified: Secondary | ICD-10-CM | POA: Diagnosis not present

## 2024-06-12 DIAGNOSIS — G8929 Other chronic pain: Secondary | ICD-10-CM | POA: Diagnosis not present

## 2024-06-12 DIAGNOSIS — Z79899 Other long term (current) drug therapy: Secondary | ICD-10-CM | POA: Diagnosis not present

## 2024-06-12 DIAGNOSIS — D509 Iron deficiency anemia, unspecified: Secondary | ICD-10-CM | POA: Diagnosis not present

## 2024-06-12 DIAGNOSIS — M549 Dorsalgia, unspecified: Secondary | ICD-10-CM | POA: Diagnosis not present

## 2024-06-12 DIAGNOSIS — Z87891 Personal history of nicotine dependence: Secondary | ICD-10-CM | POA: Diagnosis not present

## 2024-06-12 DIAGNOSIS — N183 Chronic kidney disease, stage 3 unspecified: Secondary | ICD-10-CM | POA: Diagnosis not present

## 2024-06-12 DIAGNOSIS — D508 Other iron deficiency anemias: Secondary | ICD-10-CM

## 2024-06-12 DIAGNOSIS — E1122 Type 2 diabetes mellitus with diabetic chronic kidney disease: Secondary | ICD-10-CM | POA: Diagnosis not present

## 2024-06-12 DIAGNOSIS — I129 Hypertensive chronic kidney disease with stage 1 through stage 4 chronic kidney disease, or unspecified chronic kidney disease: Secondary | ICD-10-CM | POA: Diagnosis not present

## 2024-06-12 NOTE — Progress Notes (Unsigned)
 Patient states her nerves haven't been bothering her, but no other new or acute concerns.

## 2024-06-12 NOTE — Progress Notes (Unsigned)
 Dr. Melanee asked to contact Dr. Jinny (GI) and inform that patient is iron deficient.  Labs done 05/22/24 are as follows: Ferritin 8 (11-307), Hgb 10.1 (12-25), and Hct 32.5 (36.0-46.0).  Last colonoscopy completed 04/10/17.  Patient has been scheduled for IV iron starting this Fri 10/24.  Outbound call to [phone], spoke to [place] who transferred me to [facility] who indicated that Dr. Jinny is a hospitalist who has been helping with screening colonoscopies in the meantime until another physician starts in December (Dr. Mona).  Dr. Felicitas OT for the rest of the year is full.  The new provider Dr. Mona does not have an EID yet their office cannot schedule for them as of yet; will have to wait a few weeks.  Dr. Liza does have openings in December; two full scope days in fact.  Andrea provided her direct number [phone] for any questions / concerns.

## 2024-06-12 NOTE — Telephone Encounter (Signed)
 Dr. Melanee asked to contact Dr. Jinny (GI) and inform that patient is iron deficient.  Labs done 05/22/24 are as follows: Ferritin 8 (11-307), Hgb 10.1 (12-25), and Hct 32.5 (36.0-46.0).  Last colonoscopy completed 04/10/17.  Patient has been scheduled for IV iron starting this Fri 10/24.  Outbound call to [phone], spoke to [place] who transferred me to [facility] who indicated that Dr. Jinny is a hospitalist who has been helping with screening colonoscopies in the meantime until another physician starts in December (Dr. Mona).  Dr. Felicitas OT for the rest of the year is full.  The new provider Dr. Mona does not have an EID yet their office cannot schedule for them as of yet; will have to wait a few weeks.  Dr. Liza does have openings in December; two full scope days in fact.  Andrea provided her direct number [phone] for any questions / concerns.  Per Dr. Melanee No. We will refer her to [facility] then.

## 2024-06-13 ENCOUNTER — Encounter: Payer: Self-pay | Admitting: Oncology

## 2024-06-13 ENCOUNTER — Other Ambulatory Visit: Payer: Self-pay

## 2024-06-13 DIAGNOSIS — D509 Iron deficiency anemia, unspecified: Secondary | ICD-10-CM

## 2024-06-13 NOTE — Telephone Encounter (Signed)
 Received provider portion [employer] Trulicity  waiting on pt portion and proof of income.

## 2024-06-13 NOTE — Progress Notes (Signed)
 Hematology/Oncology Consult note [facility]  [phone] [phone]  Patient Care Team: Gareth Mliss FALCON, [name] as PCP - General (Nurse Practitioner) Bonner Ade, MD as Consulting Physician (Physical Medicine and Rehabilitation) Tamea Dedra CROME, MD as Consulting Physician (Pulmonary Disease) Leora Lynwood SAUNDERS, MD as Consulting Physician (Orthopedic Surgery) Alana Sharyle LABOR, RPH-CPP as Pharmacist Melanee Annah BROCKS, MD as Consulting Physician (Oncology) Pa, [facility])   Name of the patient: Lori Potts  [id number]  11/09/1945   Date of visit: 06/13/24  Diagnosis- 1. Iron deficiency anemia 2. Abnormal spep  Chief complaint/ Reason for visit- discuss results of bloodwork  Heme/Onc history: patient is a 78 year old female with a past medical history significant for stage III CKD, hypertension hyperlipidemia type 2 diabetes asthma osteoarthritis among other medical problems.  She has been referred For abnormal SPEP.  CBC from 03/11/2024 showed H&H of 10/23.1 with an MCV of 72.3 and a platelet count of 518.  White count was normal at 5.1.  CMP was significant for creatinine of 1.1.  Electrolytes were normal and calcium  normal at 9.7.  UPEP showed possible abnormal protein band which may represent beta-2 microglobulin monoclonal immunoglobulin or free light chain.  SPEP showed possible M spike in the gamma globulin region.Looking back at patient's prior CBCs patient was noted to have a normal hemoglobin around 12 up until October 2023 and since then her hemoglobin has been fluctuating around 10.   Results of blood work from 05/22/2024 showed white cell count of 5.7, H&H of 10.1/32.5 with an MCV of 68.9 and a platelet count of 435.  Ferritin levels were low at 8 with an iron saturation of 13%.  B12 levels were low at 222.  Myeloma panel did not detect any M protein on immunofixation although 0.3 g of protein was reported on M  protein.  Serum free light chain ratio was normal at 1.4.  Interval history- Discussed the use of AI scribe software for clinical note transcription with the patient, who gave verbal consent to proceed.  History of Present Illness   Lori Potts is a 78 year old female who presents for evaluation of anemia and abnormal protein in blood. She presents for evaluation of a potential abnormal protein in the blood.  She has been experiencing anemia with a hemoglobin level of 10.1, which is below the normal range of 12 to 14. Her iron levels are low. She feels tired all the time, which may be related to her low iron levels.  Her anemia has been gradually worsening over the past two years, with her blood work showing a decline in hemoglobin levels during this period. No blood in stool, black tarry stools, blood in urine, or vaginal bleeding. Her last colonoscopy and endoscopy were performed in 2018.       ECOG PS- 1 Pain scale- 0  Review of systems- Review of Systems  Constitutional:  Negative for chills, fever, malaise/fatigue and weight loss.  HENT:  Negative for congestion, ear discharge and nosebleeds.   Eyes:  Negative for blurred vision.  Respiratory:  Negative for cough, hemoptysis, sputum production, shortness of breath and wheezing.   Cardiovascular:  Negative for chest pain, palpitations, orthopnea and claudication.  Gastrointestinal:  Negative for abdominal pain, blood in stool, constipation, diarrhea, heartburn, melena, nausea and vomiting.  Genitourinary:  Negative for dysuria, flank pain, frequency, hematuria and urgency.  Musculoskeletal:  Negative for back pain, joint pain and myalgias.  Skin:  Negative  for rash.  Neurological:  Negative for dizziness, tingling, focal weakness, seizures, weakness and headaches.  Endo/Heme/Allergies:  Does not bruise/bleed easily.  Psychiatric/Behavioral:  Negative for depression and suicidal ideas. The patient does not have insomnia.        Allergies  Allergen Reactions   Metformin And Related Diarrhea   Adhesive [Tape] Rash and Other (See Comments)    Regular tape is ok, allergy is to paper tape   Wound Dressing Adhesive Dermatitis and Other (See Comments)    Regular tape is ok, allergy is to paper tape     Past Medical History:  Diagnosis Date   Anxiety    Arthritis    Asthma    Calcification of aorta 03/02/2017   Cataract    right eye but immature   Dyspnea    Essential hypertension, benign    takes Lisinopril -HCTZ daily   GERD (gastroesophageal reflux disease)    Headache    sinus   History of anemia due to chronic kidney disease    History of colon polyps    benign   History of migraine    History of shingles    Hyperlipidemia    takes Lipitor daily   Low back pain    Nocturia    S/P insertion of spinal cord stimulator    Seasonal allergies    takes Singulair  daily as needed   Type II or unspecified type diabetes mellitus without mention of complication, not stated as uncontrolled    Vitamin D  deficiency    takes Vit D weekly   Weakness    numbness and tingling left arm   Wears dentures    full upper and lower     Past Surgical History:  Procedure Laterality Date   ABDOMINAL HYSTERECTOMY     ANTERIOR CERVICAL DECOMP/DISCECTOMY FUSION N/A 02/26/2015   Procedure: ANTERIOR CERVICAL DISCECTOMY FUSION C4-5 (1 LEVEL);  Surgeon: Donaciano Sprang, MD;  Location: [facility] OR;  Service: Orthopedics;  Laterality: N/A;   ANTERIOR LAT LUMBAR FUSION N/A 03/21/2013   Procedure: ANTERIOR LATERAL LUMBAR FUSION 1 LEVEL/ XLIF L3-L4 ;  Surgeon: Donaciano Sprang, MD;  Location: [facility] OR;  Service: Orthopedics;  Laterality: N/A;   ANTERIOR LATERAL LUMBAR FUSION WITH PERCUTANEOUS SCREW 1 LEVEL N/A 01/30/2023   Procedure: LATERAL INTERBODY FUSION LUMBAR TWO TO THREE;  Surgeon: Sprang Donaciano, MD;  Location: [facility] OR;  Service: Orthopedics;  Laterality: N/A;  5 HRS 3 C-BED LEFT TAP BLOCK WITH EXPAREL    APPENDECTOMY      AUGMENTATION MAMMAPLASTY Bilateral 1978   BACK SURGERY     BACK SURGERY     Lumbar fusion x 2   BREAST EXCISIONAL BIOPSY Right 1970   BREAST SURGERY  1990   Augementation   CARDIAC CATHETERIZATION  5/12   ef 55%   CARDIAC CATHETERIZATION  10/2010   [facility]; EF 55%   CARDIAC CATHETERIZATION Left 02/16/2016   Procedure: Left Heart Cath and Coronary Angiography;  Surgeon: Cara JONETTA Lovelace, MD;  Location: [facility] INVASIVE CV LAB;  Service: Cardiovascular;  Laterality: Left;   CATARACT EXTRACTION W/PHACO Right 03/06/2024   Procedure: PHACOEMULSIFICATION, CATARACT, WITH IOL INSERTION 6.60 00:44.6;  Surgeon: Mittie Gaskin, MD;  Location: [facility] ORS;  Service: Ophthalmology;  Laterality: Right;   CATARACT EXTRACTION W/PHACO Left 03/20/2024   Procedure: PHACOEMULSIFICATION, CATARACT, WITH IOL INSERTION 7.53 00:55.7;  Surgeon: Mittie Gaskin, MD;  [facility];  Service: Ophthalmology;  Laterality: Left;   COLONOSCOPY     COLONOSCOPY WITH PROPOFOL  N/A 04/10/2017  Procedure: COLONOSCOPY WITH PROPOFOL ;  Surgeon: Jinny Carmine, MD;  Location: [facility];  Service: Gastroenterology;  Laterality: N/A;  Diabetic - insulin    ESOPHAGOGASTRODUODENOSCOPY     JOINT REPLACEMENT     KIDNEY SURGERY  1998   growth removed from left kidney    LEFT HEART CATH AND CORONARY ANGIOGRAPHY Left 09/10/2018   Procedure: LEFT HEART CATH AND CORONARY ANGIOGRAPHY;  Surgeon: Florencio Cara BIRCH, MD;  Location: [facility] INVASIVE CV LAB;  Service: Cardiovascular;  Laterality: Left;   pain stimulator     POSTERIOR CERVICAL FUSION/FORAMINOTOMY Right 03/21/2013   Procedure: POSTERIOR L2-3 RIGHT FORAMINOTOMY;  Surgeon: Donaciano Sprang, MD;  Location: [facility] OR;  Service: Orthopedics;  Laterality: Right;   REVISION OF SCAR TISSUE RECTUS MUSCLE     SHOULDER ARTHROSCOPY WITH ROTATOR CUFF REPAIR AND SUBACROMIAL DECOMPRESSION Right 09/17/2020   Procedure: RIGHT SHOULDER ARTHROSCOPY WITH MINI-OPEN ROTATOR CUFF REPAIR,  DISTAL CLAVICLE EXCISION, AND SUBACROMIAL DECOMPRESSION WITH BICEP TENDONESIS;  Surgeon: Marchia Drivers, MD;  Location: [facility] ORS;  Service: Orthopedics;  Laterality: Right;   SMALL BOWEL REPAIR     SPINAL CORD STIMULATOR BATTERY EXCHANGE N/A 10/17/2012   Procedure: SPINAL CORD STIMULATOR BATTERY REMOVAL;  Surgeon: Donaciano Sprang, MD;  Location: [facility] OR;  Service: Orthopedics;  Laterality: N/A;   SPINAL CORD STIMULATOR BATTERY EXCHANGE N/A 07/22/2015   Procedure: REIMPLANTATION OF SPINAL CORD STIMULATOR BATTERY ;  Surgeon: Donaciano Sprang, MD;  Location: [facility] OR;  Service: Orthopedics;  Laterality: N/A;   TOTAL HIP ARTHROPLASTY Left 06/08/2022   Procedure: TOTAL HIP ARTHROPLASTY ANTERIOR APPROACH;  Surgeon: Melodi Lerner, MD;  Location: [facility] ORS;  Service: Orthopedics;  Laterality: Left;   TOTAL HIP ARTHROPLASTY Right 09/27/2023   Procedure: TOTAL HIP ARTHROPLASTY ANTERIOR APPROACH;  Surgeon: Melodi Lerner, MD;  Location: [facility] ORS;  Service: Orthopedics;  Laterality: Right;   TOTAL KNEE ARTHROPLASTY Left    TOTAL KNEE ARTHROPLASTY Right 07/28/2017   Procedure: RIGHT TOTAL KNEE ARTHROPLASTY;  Surgeon: Gerome Charleston, MD;  Location: [facility] ORS;  Service: Orthopedics;  Laterality: Right;    Social History   Socioeconomic History   Marital status: Married    Spouse name: Elsie   Number of children: 3   Years of education: Not on file   Highest education level: Associate degree: occupational, Scientist, product/process development, or vocational program  Occupational History   Not on file  Tobacco Use   Smoking status: Former    Current packs/day: 0.00    Types: Cigarettes    Quit date: 2006    Years since quitting: 19.8   Smokeless tobacco: Never   Tobacco comments:    quit smoking 33yrs ago  Vaping Use   Vaping status: Never Used  Substance and Sexual Activity   Alcohol use: No   Drug use: No   Sexual activity: Yes    Partners: Male    Birth control/protection: Surgical  Other Topics Concern   Not on file  Social  History Narrative   Not on file   Social Drivers of Health   Financial Resource Strain: Low Risk  (04/04/2024)   Overall Financial Resource Strain (CARDIA)    Difficulty of Paying Living Expenses: Not very hard  Food Insecurity: No Food Insecurity (05/22/2024)   Hunger Vital Sign    Worried About Running Out of Food in the Last Year: Never true    Ran Out of Food in the Last Year: Never true  Transportation Needs: No Transportation Needs (05/22/2024)   PRAPARE - Transportation    Lack of  Transportation (Medical): No    Lack of Transportation (Non-Medical): No  Physical Activity: Insufficiently Active (04/04/2024)   Exercise Vital Sign    Days of Exercise per Week: 2 days    Minutes of Exercise per Session: 20 min  Stress: No Stress Concern Present (04/04/2024)   [employer] of Occupational Health - Occupational Stress Questionnaire    Feeling of Stress: Only a little  Social Connections: Moderately Integrated (04/04/2024)   Social Connection and Isolation Panel    Frequency of Communication with Friends and Family: More than three times a week    Frequency of Social Gatherings with Friends and Family: More than three times a week    Attends Religious Services: More than 4 times per year    Active Member of [employer] or Organizations: No    Attends Banker Meetings: Never    Marital Status: Married  Catering manager Violence: Not At Risk (05/22/2024)   Humiliation, Afraid, Rape, and Kick questionnaire    Fear of Current or Ex-Partner: No    Emotionally Abused: No    Physically Abused: No    Sexually Abused: No    Family History  Problem Relation Age of Onset   Heart attack Mother    Sarcoidosis Sister      Current Outpatient Medications:    albuterol  (VENTOLIN  HFA) 108 (90 Base) MCG/ACT inhaler, Inhale 2 puffs into the lungs every 6 (six) hours as needed for wheezing or shortness of breath., Disp: 18 g, Rfl: 2   Alcohol Swabs  (DROPSAFE ALCOHOL PREP) 70 % PADS,  USE TO TEST BLOOD SUGAR TWO TIMES DAILY, Disp: 200 each, Rfl: 3   aspirin  81 MG chewable tablet, CHEW 1 TABLET BY MOUTH 2 (TWO) TIMES DAILY FOR 20 DAYS THEN ONCE A DAY, Disp: , Rfl:    atorvastatin  (LIPITOR) 80 MG tablet, Take 80 mg by mouth daily., Disp: , Rfl:    Blood Glucose Monitoring Suppl (TRUE METRIX AIR GLUCOSE METER) w/Device KIT, USE AS DIRECTED, Disp: 1 kit, Rfl: 3   budesonide -glycopyrrolate -formoterol  (BREZTRI  AEROSPHERE) 160-9-4.8 MCG/ACT AERO inhaler, Inhale 2 puffs into the lungs 2 (two) times daily., Disp: 10.7 g, Rfl: 5   busPIRone  (BUSPAR ) 5 MG tablet, TAKE 1 TABLET THREE TIMES DAILY AS NEEDED, Disp: 270 tablet, Rfl: 3   butalbital -acetaminophen -caffeine  (FIORICET) 50-325-40 MG tablet, Take 1 tablet by mouth every 6 (six) hours as needed for headache., Disp: 14 tablet, Rfl: 0   citalopram  (CELEXA ) 40 MG tablet, TAKE 1 TABLET EVERY DAY, Disp: 90 tablet, Rfl: 1   Continuous Glucose Sensor (FREESTYLE LIBRE 3 SENSOR) MISC, 1 Device by Does not apply route every 14 (fourteen) days., Disp: 2 each, Rfl: 11   Dulaglutide  (TRULICITY ) 0.75 MG/0.5ML SOPN, Inject 0.75 mg into the skin once a week. Patient receives via [employer] Patient Assistance through Dec 2023, Disp: 6 mL, Rfl: 1   esomeprazole  (NEXIUM ) 40 MG capsule, Take 1 capsule (40 mg total) by mouth at bedtime., Disp: 90 capsule, Rfl: 1   estradiol  (ESTRACE ) 0.1 MG/GM vaginal cream, Place 1 Applicatorful vaginally 3 (three) times a week., Disp: 42.5 g, Rfl: 12   famotidine  (PEPCID ) 20 MG tablet, TAKE 1 TABLET BY MOUTH TWICE A DAY, Disp: 180 tablet, Rfl: 0   FARXIGA  5 MG TABS tablet, TAKE 1 TABLET BY MOUTH DAILY BEFORE BREAKFAST., Disp: 90 tablet, Rfl: 1   fluconazole  (DIFLUCAN ) 150 MG tablet, Take 1 tablet (150 mg total) by mouth every 3 (three) days as needed (for vaginal itching/yeast infection sx).,  Disp: 2 tablet, Rfl: 0   hydrOXYzine  (VISTARIL ) 25 MG capsule, TAKE 1 CAPSULE EVERY 8 HOURS AS NEEDED, Disp: 30 capsule, Rfl: 0    losartan  (COZAAR ) 25 MG tablet, TAKE 1 TABLET BY MOUTH EVERY EVENING, Disp: 90 tablet, Rfl: 0   meclizine  (ANTIVERT ) 25 MG tablet, TAKE 1 TABLET BY MOUTH 3 TIMES DAILY AS NEEDED FOR DIZZINESS., Disp: 90 tablet, Rfl: 0   methocarbamol  (ROBAXIN ) 500 MG tablet, Take 1 tablet (500 mg total) by mouth every 6 (six) hours as needed for muscle spasms., Disp: 40 tablet, Rfl: 0   montelukast  (SINGULAIR ) 10 MG tablet, Take 1 tablet (10 mg total) by mouth at bedtime., Disp: 90 tablet, Rfl: 1   ondansetron  (ZOFRAN ) 4 MG tablet, Take 1 tablet (4 mg total) by mouth every 6 (six) hours as needed for nausea., Disp: 20 tablet, Rfl: 0   ondansetron  (ZOFRAN -ODT) 4 MG disintegrating tablet, Take 1 tablet (4 mg total) by mouth every 8 (eight) hours as needed for nausea or vomiting., Disp: 20 tablet, Rfl: 0   oxyCODONE -acetaminophen  (PERCOCET) 10-325 MG tablet, Take 1 tablet by mouth every 6 (six) hours as needed for pain., Disp: , Rfl:    propranolol  (INDERAL ) 20 MG tablet, TAKE 1 TABLET BY MOUTH THREE TIMES A DAY, Disp: 270 tablet, Rfl: 1   rosuvastatin  (CRESTOR ) 40 MG tablet, TAKE 1 TABLET AT BEDTIME, Disp: 90 tablet, Rfl: 3   topiramate  (TOPAMAX ) 25 MG tablet, Take 1 tablet (25 mg total) by mouth at bedtime., Disp: 90 tablet, Rfl: 1   triamcinolone  ointment (KENALOG ) 0.1 %, Apply 1 Application topically 2 (two) times daily. To affected areas, Disp: 60 g, Rfl: 0   TRUEplus Lancets 33G MISC, TEST BLOOD SUGAR TWO TIMES DAILY, Disp: 200 each, Rfl: 0  Physical exam:  Vitals:   06/12/24 1522  BP: (!) 147/73  Pulse: 62  Resp: 19  Temp: 97.8 F (36.6 C)  TempSrc: Tympanic  SpO2: 98%  Weight: 179 lb 9.6 oz (81.5 kg)  Height: 5' 3 (1.6 m)   Physical Exam Cardiovascular:     Rate and Rhythm: Normal rate and regular rhythm.     Heart sounds: Normal heart sounds.  Pulmonary:     Effort: Pulmonary effort is normal.     Breath sounds: Normal breath sounds.  Skin:    General: Skin is warm and dry.  Neurological:      Mental Status: She is alert and oriented to person, place, and time.      I have personally reviewed labs listed below:    Latest Ref Rng & Units 05/22/2024   11:46 AM  CMP  Glucose 70 - 99 mg/dL 891   BUN 8 - 23 mg/dL 19   Creatinine 9.55 - 1.00 mg/dL 8.83   Sodium 864 - 854 mmol/L 139   Potassium 3.5 - 5.1 mmol/L 4.2   Chloride 98 - 111 mmol/L 105   CO2 22 - 32 mmol/L 27   Calcium  8.9 - 10.3 mg/dL 9.5   Total Protein 6.5 - 8.1 g/dL 7.0   Total Bilirubin 0.0 - 1.2 mg/dL 0.5   Alkaline Phos 38 - 126 U/L 59   AST 15 - 41 U/L 20   ALT 0 - 44 U/L 15       Latest Ref Rng & Units 05/22/2024   11:46 AM  CBC  WBC 4.0 - 10.5 K/uL 5.7   Hemoglobin 12.0 - 15.0 g/dL 89.8   Hematocrit 63.9 - 46.0 % 32.5  Platelets 150 - 400 K/uL 435      Assessment and plan- Patient is a 78 y.o. female here to discuss results of blood work  Assessment and Plan    Iron Deficiency Anemia Iron deficiency anemia with hemoglobin at 10.1 g/dL and low iron levels. Etiology unclear; further investigation needed for potential gastrointestinal bleeding. - Schedule iron infusions.  Discussed risks and benefits of IV iron including all but not limited to possible risk of infusion and anaphylactic reaction.  Patient understands and agrees to proceed as planned - Repeat blood work in two months. - We did try to contact Dr. Jinny for her colonoscopy but he has no openings and therefore I am referring the patient to [facility] - Follow-up in four months.  Monoclonal Gammopathy of Undetermined Significance (MGUS) No abnormal protein detected in current tests, indicating no overt blood disorder. - Recheck for abnormal protein in 4 months.         Visit Diagnosis 1. Microcytic anemia   2. Abnormal SPEP   3. Other iron deficiency anemia      Dr. Annah Skene, MD, MPH [facility] at [facility] [phone] 06/13/2024 1:35 PM

## 2024-06-14 ENCOUNTER — Inpatient Hospital Stay

## 2024-06-14 ENCOUNTER — Encounter: Payer: Self-pay | Admitting: Oncology

## 2024-06-14 VITALS — BP 128/56 | HR 61 | Temp 95.5°F | Resp 16

## 2024-06-14 DIAGNOSIS — Z79899 Other long term (current) drug therapy: Secondary | ICD-10-CM | POA: Diagnosis not present

## 2024-06-14 DIAGNOSIS — D508 Other iron deficiency anemias: Secondary | ICD-10-CM

## 2024-06-14 DIAGNOSIS — D509 Iron deficiency anemia, unspecified: Secondary | ICD-10-CM | POA: Diagnosis not present

## 2024-06-14 DIAGNOSIS — G8929 Other chronic pain: Secondary | ICD-10-CM | POA: Diagnosis not present

## 2024-06-14 DIAGNOSIS — M549 Dorsalgia, unspecified: Secondary | ICD-10-CM | POA: Diagnosis not present

## 2024-06-14 DIAGNOSIS — Z87891 Personal history of nicotine dependence: Secondary | ICD-10-CM | POA: Diagnosis not present

## 2024-06-14 MED ORDER — IRON SUCROSE 20 MG/ML IV SOLN
200.0000 mg | INTRAVENOUS | Status: DC
  Administered 2024-06-14: 200 mg via INTRAVENOUS
  Filled 2024-06-14: qty 10

## 2024-06-14 NOTE — Patient Instructions (Signed)

## 2024-06-17 ENCOUNTER — Encounter: Payer: Self-pay | Admitting: Oncology

## 2024-06-17 ENCOUNTER — Inpatient Hospital Stay

## 2024-06-17 VITALS — BP 135/67 | HR 65 | Temp 96.4°F | Resp 18

## 2024-06-17 DIAGNOSIS — D509 Iron deficiency anemia, unspecified: Secondary | ICD-10-CM | POA: Diagnosis not present

## 2024-06-17 DIAGNOSIS — D508 Other iron deficiency anemias: Secondary | ICD-10-CM

## 2024-06-17 MED ORDER — IRON SUCROSE 20 MG/ML IV SOLN
200.0000 mg | INTRAVENOUS | Status: DC
  Administered 2024-06-17: 200 mg via INTRAVENOUS
  Filled 2024-06-17: qty 10

## 2024-06-17 NOTE — Patient Instructions (Signed)

## 2024-06-19 ENCOUNTER — Inpatient Hospital Stay

## 2024-06-19 ENCOUNTER — Other Ambulatory Visit: Payer: Self-pay | Admitting: Pharmacist

## 2024-06-19 ENCOUNTER — Telehealth: Payer: Self-pay | Admitting: Pharmacist

## 2024-06-19 NOTE — Progress Notes (Signed)
   Outreach Note  06/19/2024 Name: Lori Potts MRN: [id number] DOB: 03-18-1946  Referred by: Gareth Mliss FALCON, [name]  Reach patient today by telephone. However, shares that now is not a good time to talk as she is at the hospital for her husband today.  Follow Up Plan: Reschedule our appointment as requested. Will outreach to patient by telephone on 06/26/2024 at 9:30 AM   Sharyle Sia, PharmD, [facility] [phone]

## 2024-06-20 ENCOUNTER — Other Ambulatory Visit: Payer: Self-pay | Admitting: Nurse Practitioner

## 2024-06-20 DIAGNOSIS — G43119 Migraine with aura, intractable, without status migrainosus: Secondary | ICD-10-CM

## 2024-06-20 DIAGNOSIS — I10 Essential (primary) hypertension: Secondary | ICD-10-CM

## 2024-06-21 NOTE — Telephone Encounter (Signed)
 Called patient to schedule OV for medication refills. Please advise if appt can also be for annual physical.

## 2024-06-21 NOTE — Telephone Encounter (Signed)
 Requested medication (s) are due for refill today: yes   Requested medication (s) are on the active medication list: yes   Last refill:  12/23/23 #270 1 refill  Future visit scheduled: yes 07/08/24  Notes to clinic:  do you want to refill #270 0 refills? Can future visit scheduled for 07/08/24 annual physical?      Requested Prescriptions  Pending Prescriptions Disp Refills   propranolol  (INDERAL ) 20 MG tablet [Pharmacy Med Name: PROPRANOLOL  20 MG TABLET] 270 tablet 1    Sig: TAKE 1 TABLET BY MOUTH THREE TIMES A DAY     Cardiovascular:  Beta Blockers Passed - 06/21/2024  3:37 PM      Passed - Last BP in normal range    BP Readings from Last 1 Encounters:  06/17/24 135/67         Passed - Last Heart Rate in normal range    Pulse Readings from Last 1 Encounters:  06/17/24 65         Passed - Valid encounter within last 6 months    Recent Outpatient Visits           1 month ago Urinary frequency   [facility] Gareth Mliss FALCON, [name]   3 months ago Shortness of breath   [facility] Gareth Mliss FALCON, [name]   5 months ago Rash   [facility] Gareth Mliss FALCON, [name]   6 months ago Essential (primary) hypertension   [facility] Gareth Mliss FALCON, [name]

## 2024-06-21 NOTE — Telephone Encounter (Signed)
 Requested Prescriptions  Pending Prescriptions Disp Refills   TRUEplus Lancets 33G MISC [Pharmacy Med Name: TRUEPLUS LANCETS 33G] 200 each 0    Sig: TEST BLOOD SUGAR TWO TIMES DAILY     Endocrinology: Diabetes - Testing Supplies Passed - 06/21/2024  2:27 PM      Passed - Valid encounter within last 12 months    Recent Outpatient Visits           1 month ago Urinary frequency   [facility] Gareth Mliss FALCON, [name]   3 months ago Shortness of breath   [facility] Gareth Mliss FALCON, [name]   5 months ago Rash   [facility] Gareth Mliss FALCON, [name]   6 months ago Essential (primary) hypertension   [facility] Gareth Mliss FALCON, [name]

## 2024-06-24 ENCOUNTER — Encounter: Payer: Self-pay | Admitting: Oncology

## 2024-06-24 ENCOUNTER — Other Ambulatory Visit: Payer: Self-pay | Admitting: Pharmacist

## 2024-06-24 ENCOUNTER — Inpatient Hospital Stay: Attending: Oncology

## 2024-06-24 VITALS — BP 139/65 | HR 62 | Temp 96.4°F

## 2024-06-24 DIAGNOSIS — E1129 Type 2 diabetes mellitus with other diabetic kidney complication: Secondary | ICD-10-CM

## 2024-06-24 DIAGNOSIS — D509 Iron deficiency anemia, unspecified: Secondary | ICD-10-CM | POA: Insufficient documentation

## 2024-06-24 DIAGNOSIS — I10 Essential (primary) hypertension: Secondary | ICD-10-CM

## 2024-06-24 DIAGNOSIS — D508 Other iron deficiency anemias: Secondary | ICD-10-CM

## 2024-06-24 DIAGNOSIS — J4540 Moderate persistent asthma, uncomplicated: Secondary | ICD-10-CM

## 2024-06-24 MED ORDER — IRON SUCROSE 20 MG/ML IV SOLN
200.0000 mg | INTRAVENOUS | Status: DC
  Administered 2024-06-24: 200 mg via INTRAVENOUS
  Filled 2024-06-24: qty 10

## 2024-06-24 NOTE — Patient Instructions (Signed)
 Goals Addressed             This Visit's Progress    Pharmacy Goals       If you need to reach out to patient assistance programs regarding refills or to find out the status of your application, you can do so by calling:   For Marcelline Deist and [place] assistance, [place] at: [phone] For Trulicity assistance, [employer] at: [phone]   The goal A1c is less than 7%. This is the best way to reduce the risk of the long term complications of diabetes, including heart disease, kidney disease, eye disease, strokes, and nerve damage. An A1c of less than 7% corresponds with fasting sugars less than 130 and 2 hour after meal sugars less than 180.    Thank you!    Estelle Grumbles, PharmD, [facility] [phone]

## 2024-06-24 NOTE — Progress Notes (Signed)
 06/24/2024 Name: Lori Potts MRN: [id number] DOB: 05-Aug-1946  Chief Complaint  Patient presents with   Medication Assistance    Lori Potts is a 78 y.o. year old female who presented for a telephone visit.   They were referred to the pharmacist by their PCP for assistance in managing medication access.      Subjective:   Care Team: Primary Care Provider: Gareth Mliss FALCON, [name]; Next Scheduled Visit: 07/08/2024 Cardiologist: Florencio Cara Endow, MD Neurologist: Maree Jannett Hering, MD GI Specialist: Therisa Bi, MD  Nephrologist: Marcelino Gales, MD; Next Scheduled Visit: 07/15/2024  Hematologist: Melanee Annah BROCKS, MD   Medication Access/Adherence  Current Pharmacy:  [facility] [id number] [place] - [address] [place] [address] Phone: [phone] Fax: [phone]  [facility] - [place], [place] - [address] [place] [address] Phone: [phone] Fax: [phone]  [id number] [place], [place] - [address] [place] [address] Phone: [phone] Fax: [phone]  [facility] - [place], [place] - [address] [place] [address] [place] [address] Phone: [phone] Fax: [phone]   Patient reports affordability concerns with their medications: No Patient reports access/transportation concerns to their pharmacy: No  Patient reports adherence concerns with their medications:  No       Diabetes:   Current medications:  - Farxiga  5 mg daily - Trulicity  0.75 mg weekly    Medications tried in the past: Ozempic    Current glucose readings: recall last checked yesterday morning 138   Patient denies hypoglycemic s/sx including dizziness, shakiness, sweating.      Current medication access support:  - Enrolled in patient assistance for Farxiga  through [employer] and Trulicity  patient assistance from [employer] through 08/21/2024   Collaborating with  CPhT and PCP to support patient with re-enrollment in Farxiga  patient assistance through [employer] and Trulicity  patient assistance from [employer] for 2026 calendar year Patient confirms received paperwork in mail from CPhT Shasta Spear and plans to complete and mail back to CPhT   Hypertension:   Current medications:  - losartan  25 mg daily - propranolol  20 mg three times daily   Patient has a validated, automated, upper arm home BP cuff Denies checking home blood pressure recently, but checked in office today   Patient recent denies hypotensive s/sx including dizziness, lightheadedness.    Current physical activity: movement limited by hip pain, but stays active throughout the day     Asthma and Allergic Rhinitis:   Current medications: - Albuterol  HFA 2 puffs every 6 hours as needed  - Breztri  inhaler - Identify that recently patient has only been using Breztri  2 puffs in the mornings (not using in the evening) - Montelukast  10 mg nightly  - Azelastine  nasal spray twice daily as needed   Medications tried in the past: Breo, Trelegy (cost)     Current medication access support:  - Enrolled in patient assistance for Breztri  through [employer]Me through 08/21/2024 Collaborating with CPhT and PCP to support patient with re-enrollment in Breztri  patient assistance through [employer]    Objective:  Lab Results  Component Value Date   HGBA1C 6.7 (H) 12/19/2023    Lab Results  Component Value Date   CREATININE 1.16 (H) 05/22/2024   BUN 19 05/22/2024   NA 139 05/22/2024   K 4.2 05/22/2024   CL 105 05/22/2024   CO2 27 05/22/2024    Lab  Results  Component Value Date   CHOL 163 12/19/2023   HDL 70 12/19/2023   LDLCALC 79 12/19/2023   TRIG 67 12/19/2023   CHOLHDL 2.3 12/19/2023   BP Readings from Last 3 Encounters:  06/24/24 139/65  06/17/24 135/67  06/14/24 (!) 128/56   Pulse Readings from Last 3 Encounters:  06/24/24 62  06/17/24 65  06/14/24 61     Medications  Reviewed Today     Reviewed by Alana Sharyle LABOR, RPH-CPP (Pharmacist) on 06/24/24 at 1539  Med List Status: <None>   Medication Order Taking? Sig Documenting Provider Last Dose Status Informant  albuterol  (VENTOLIN  HFA) 108 (90 Base) MCG/ACT inhaler [id number] Yes Inhale 2 puffs into the lungs every 6 (six) hours as needed for wheezing or shortness of breath. Gareth Mliss FALCON, [name]  Active   Alcohol Swabs  (DROPSAFE ALCOHOL PREP) 70 % PADS [id number]  USE TO TEST BLOOD SUGAR TWO TIMES DAILY Gareth Mliss FALCON, [name]  Active   aspirin  81 MG chewable tablet [id number] Yes CHEW 1 TABLET BY MOUTH 2 (TWO) TIMES DAILY FOR 20 DAYS THEN ONCE A DAY [provider]  Active     Discontinued 06/24/24 1533 (Patient has not taken in last 30 days)            Med Note>> Alana Sharyle LABOR, RPH-CPP   06/24/2024  3:33 PM Taking rosuvastatin     Blood Glucose Monitoring Suppl (TRUE METRIX AIR GLUCOSE METER) w/Device KIT [id number]  USE AS DIRECTED Pender, Julie F, [name]  Active   budesonide -glycopyrrolate -formoterol  (BREZTRI  AEROSPHERE) 160-9-4.8 MCG/ACT AERO inhaler [id number]  Inhale 2 puffs into the lungs 2 (two) times daily. Gareth Mliss FALCON, [name]  Active   busPIRone  (BUSPAR ) 5 MG tablet [id number]  TAKE 1 TABLET THREE TIMES DAILY AS NEEDED Pender, Julie F, [name]  Active Self  butalbital -acetaminophen -caffeine  (FIORICET) 50-325-40 MG tablet [id number] Yes Take 1 tablet by mouth every 6 (six) hours as needed for headache. Gareth Mliss FALCON, [name]  Active Self  citalopram  (CELEXA ) 40 MG tablet [id number] Yes TAKE 1 TABLET EVERY DAY Pender, Julie F, [name]  Active   Continuous Glucose Sensor (FREESTYLE LIBRE 3 SENSOR) [name] [id number]  1 Device by Does not apply route every 14 (fourteen) days. Gareth Mliss FALCON, [name]  Active Self  Dulaglutide  (TRULICITY ) 0.75 MG/0.5ML NELMA [id number] Yes Inject 0.75 mg into the skin once a week. Patient receives via [employer] Patient Assistance through Dec 2023 Gareth Mliss FALCON, [name]  Active Self   esomeprazole  (NEXIUM ) 40 MG capsule [id number] Yes Take 1 capsule (40 mg total) by mouth at bedtime. Gareth Mliss FALCON, [name]  Active   estradiol  (ESTRACE ) 0.1 MG/GM vaginal cream [id number]  Place 1 Applicatorful vaginally 3 (three) times a week. Gareth Mliss FALCON, [name]  Active    Patient not taking:   Discontinued 06/24/24 1535 (Patient has not taken in last 30 days)   FARXIGA  5 MG TABS tablet [id number] Yes TAKE 1 TABLET BY MOUTH DAILY BEFORE BREAKFAST. Gareth Mliss FALCON, [name]  Active     Discontinued 06/24/24 1537 (Completed Course)   hydrOXYzine  (VISTARIL ) 25 MG capsule [id number]  TAKE 1 CAPSULE EVERY 8 HOURS AS NEEDED Pender, Julie F, [name]  Active   losartan  (COZAAR ) 25 MG tablet [id number] Yes TAKE 1 TABLET BY MOUTH EVERY EVENING Pender, Julie F, [name]  Active   meclizine  (ANTIVERT ) 25 MG tablet [id number]  TAKE 1 TABLET BY MOUTH 3 TIMES DAILY AS NEEDED FOR DIZZINESS. Pender, Julie F, [name]  Active   methocarbamol  (ROBAXIN )  500 MG tablet [id number]  Take 1 tablet (500 mg total) by mouth every 6 (six) hours as needed for muscle spasms. Edmisten, Kristie L, PA  Active   montelukast  (SINGULAIR ) 10 MG tablet [id number] Yes Take 1 tablet (10 mg total) by mouth at bedtime. Gareth Mliss FALCON, [name]  Active   ondansetron  (ZOFRAN ) 4 MG tablet [id number]  Take 1 tablet (4 mg total) by mouth every 6 (six) hours as needed for nausea. Reena Roxie CROME, [place]  Active     Discontinued 06/24/24 1539 (Duplicate) oxyCODONE -acetaminophen  (PERCOCET) 10-325 MG tablet [id number]  Take 1 tablet by mouth every 6 (six) hours as needed for pain. [provider]  Active   propranolol  (INDERAL ) 20 MG tablet [id number] Yes TAKE 1 TABLET BY MOUTH THREE TIMES A DAY Pender, Julie F, [name]  Active   rosuvastatin  (CRESTOR ) 40 MG tablet [id number] Yes TAKE 1 TABLET AT BEDTIME Pender, Julie F, [name]  Active   topiramate  (TOPAMAX ) 25 MG tablet [id number]  Take 1 tablet (25 mg total) by mouth at bedtime. Gareth Mliss FALCON, [name]  Active   triamcinolone   ointment (KENALOG ) 0.1 % [id number]  Apply 1 Application topically 2 (two) times daily. To affected areas Gareth Mliss FALCON, [name]  Active   TRUEplus Lancets 33G MISC [id number]  TEST BLOOD SUGAR TWO TIMES DAILY Pender, Julie F, [name]  Active               Assessment/Plan:   Collaborating with CPhT and PCP to support patient with re-enrollment in Farxiga  & Breztri  inhaler patient assistance through AZ&Me and Trulicity  patient assistance from [employer] for 2026 calendar year Patient confirms received paperwork in mail from CPhT Shasta Spear and plans to complete and mail back to CPhT  Provide patient with counseling on sleep hygiene, in particular encourage patient to avoid looking at screens prior to bedtime  Diabetes: - Reviewed long term cardiovascular and renal outcomes of uncontrolled blood sugar - Reviewed goal A1c, goal fasting, and goal 2 hour post prandial glucose - Reviewed dietary modifications including importance of having regular well-balanced meals while controlling carbohydrate portion sizes - Recommend to check glucose, keep log of results and have this record to review during future medical appointments - Patient to follow up with [employer] as needed for refills of Trulicity  and [employer] as needed for refills of Farxiga    Hypertension: - Reviewed long term cardiovascular and renal outcomes of uncontrolled blood pressure - Recommended to check home blood pressure and heart rate, keep log of results and have this record to review during future medical appointments     Asthma: - Reviewed appropriate inhaler technique. Advise patient to start using Breztri  2 puffs in the mornings AND 2 puffs in the evenings as directed  - Patient to follow up with [employer] as needed for refills of Breztri    Follow Up Plan: Schedule follow up for patient to speak with Clinical Pharmacist Peyton Ferries by telephone on 09/02/2024 at 10:30 AM        Sharyle Sia, PharmD, [facility] [phone]

## 2024-06-26 ENCOUNTER — Inpatient Hospital Stay

## 2024-06-26 ENCOUNTER — Other Ambulatory Visit: Payer: Self-pay | Admitting: Pharmacist

## 2024-06-28 ENCOUNTER — Inpatient Hospital Stay

## 2024-07-01 ENCOUNTER — Telehealth: Payer: Self-pay | Admitting: Oncology

## 2024-07-01 ENCOUNTER — Inpatient Hospital Stay

## 2024-07-01 ENCOUNTER — Telehealth: Payer: Self-pay

## 2024-07-01 NOTE — Telephone Encounter (Signed)
 PAP: Application for Tresiba  has been submitted to [employer], via fax  Sent pt and provider portions, copy of insurance card, and snapshot of allergies, problem list, current meds, etc. Updated in spreadsheet

## 2024-07-01 NOTE — Telephone Encounter (Signed)
 Pt called to r/s iron infusions from this week to next week as pt spouse just got out of the hospital and pt needs to stay with him. Pt also stated she has the sniffles.   Appts r/s to next week and new dates and times confirmed

## 2024-07-03 ENCOUNTER — Inpatient Hospital Stay

## 2024-07-04 ENCOUNTER — Other Ambulatory Visit (HOSPITAL_COMMUNITY): Payer: Self-pay

## 2024-07-08 ENCOUNTER — Other Ambulatory Visit (HOSPITAL_COMMUNITY): Payer: Self-pay

## 2024-07-08 ENCOUNTER — Ambulatory Visit: Admitting: Nurse Practitioner

## 2024-07-08 DIAGNOSIS — F419 Anxiety disorder, unspecified: Secondary | ICD-10-CM | POA: Diagnosis not present

## 2024-07-08 DIAGNOSIS — I10 Essential (primary) hypertension: Secondary | ICD-10-CM | POA: Diagnosis not present

## 2024-07-08 DIAGNOSIS — R0602 Shortness of breath: Secondary | ICD-10-CM | POA: Diagnosis not present

## 2024-07-08 DIAGNOSIS — I209 Angina pectoris, unspecified: Secondary | ICD-10-CM | POA: Diagnosis not present

## 2024-07-08 DIAGNOSIS — R9431 Abnormal electrocardiogram [ECG] [EKG]: Secondary | ICD-10-CM | POA: Diagnosis not present

## 2024-07-08 DIAGNOSIS — E7800 Pure hypercholesterolemia, unspecified: Secondary | ICD-10-CM | POA: Diagnosis not present

## 2024-07-08 DIAGNOSIS — I2089 Other forms of angina pectoris: Secondary | ICD-10-CM | POA: Diagnosis not present

## 2024-07-08 DIAGNOSIS — R002 Palpitations: Secondary | ICD-10-CM | POA: Diagnosis not present

## 2024-07-08 NOTE — Telephone Encounter (Signed)
 Received approval letter from [employer] Farxiga  and Brestri thru 08/21/2025 approval letter index.

## 2024-07-09 ENCOUNTER — Inpatient Hospital Stay

## 2024-07-09 VITALS — BP 101/60 | HR 64 | Temp 96.9°F | Resp 16

## 2024-07-09 DIAGNOSIS — D509 Iron deficiency anemia, unspecified: Secondary | ICD-10-CM | POA: Diagnosis not present

## 2024-07-09 DIAGNOSIS — D508 Other iron deficiency anemias: Secondary | ICD-10-CM

## 2024-07-09 MED ORDER — IRON SUCROSE 20 MG/ML IV SOLN
200.0000 mg | INTRAVENOUS | Status: DC
  Administered 2024-07-09: 200 mg via INTRAVENOUS
  Filled 2024-07-09: qty 10

## 2024-07-10 ENCOUNTER — Other Ambulatory Visit (HOSPITAL_COMMUNITY): Payer: Self-pay

## 2024-07-11 ENCOUNTER — Other Ambulatory Visit: Payer: Self-pay

## 2024-07-11 ENCOUNTER — Telehealth: Payer: Self-pay

## 2024-07-11 ENCOUNTER — Encounter: Payer: Self-pay | Admitting: Nurse Practitioner

## 2024-07-11 ENCOUNTER — Inpatient Hospital Stay

## 2024-07-11 ENCOUNTER — Other Ambulatory Visit: Payer: Self-pay | Admitting: Internal Medicine

## 2024-07-11 ENCOUNTER — Emergency Department

## 2024-07-11 ENCOUNTER — Emergency Department
Admission: EM | Admit: 2024-07-11 | Discharge: 2024-07-11 | Disposition: A | Discharge status: Home / Self Care | Attending: Emergency Medicine | Admitting: Emergency Medicine

## 2024-07-11 ENCOUNTER — Ambulatory Visit (INDEPENDENT_AMBULATORY_CARE_PROVIDER_SITE_OTHER): Admitting: Nurse Practitioner

## 2024-07-11 ENCOUNTER — Encounter: Payer: Self-pay | Admitting: Emergency Medicine

## 2024-07-11 VITALS — BP 110/76 | HR 67 | Temp 97.0°F | Ht 63.0 in | Wt 181.0 lb

## 2024-07-11 DIAGNOSIS — K219 Gastro-esophageal reflux disease without esophagitis: Secondary | ICD-10-CM | POA: Diagnosis not present

## 2024-07-11 DIAGNOSIS — R809 Proteinuria, unspecified: Secondary | ICD-10-CM | POA: Diagnosis not present

## 2024-07-11 DIAGNOSIS — J45909 Unspecified asthma, uncomplicated: Secondary | ICD-10-CM | POA: Diagnosis not present

## 2024-07-11 DIAGNOSIS — I2089 Other forms of angina pectoris: Secondary | ICD-10-CM

## 2024-07-11 DIAGNOSIS — J9801 Acute bronchospasm: Secondary | ICD-10-CM | POA: Diagnosis not present

## 2024-07-11 DIAGNOSIS — Z1231 Encounter for screening mammogram for malignant neoplasm of breast: Secondary | ICD-10-CM

## 2024-07-11 DIAGNOSIS — F330 Major depressive disorder, recurrent, mild: Secondary | ICD-10-CM

## 2024-07-11 DIAGNOSIS — E1129 Type 2 diabetes mellitus with other diabetic kidney complication: Secondary | ICD-10-CM

## 2024-07-11 DIAGNOSIS — M169 Osteoarthritis of hip, unspecified: Secondary | ICD-10-CM

## 2024-07-11 DIAGNOSIS — E1169 Type 2 diabetes mellitus with other specified complication: Secondary | ICD-10-CM | POA: Diagnosis not present

## 2024-07-11 DIAGNOSIS — R002 Palpitations: Secondary | ICD-10-CM

## 2024-07-11 DIAGNOSIS — I10 Essential (primary) hypertension: Secondary | ICD-10-CM

## 2024-07-11 DIAGNOSIS — J309 Allergic rhinitis, unspecified: Secondary | ICD-10-CM

## 2024-07-11 DIAGNOSIS — J4541 Moderate persistent asthma with (acute) exacerbation: Secondary | ICD-10-CM

## 2024-07-11 DIAGNOSIS — M5030 Other cervical disc degeneration, unspecified cervical region: Secondary | ICD-10-CM

## 2024-07-11 DIAGNOSIS — G43119 Migraine with aura, intractable, without status migrainosus: Secondary | ICD-10-CM

## 2024-07-11 DIAGNOSIS — R0602 Shortness of breath: Secondary | ICD-10-CM

## 2024-07-11 DIAGNOSIS — N1831 Chronic kidney disease, stage 3a: Secondary | ICD-10-CM

## 2024-07-11 DIAGNOSIS — F419 Anxiety disorder, unspecified: Secondary | ICD-10-CM

## 2024-07-11 LAB — CBC WITH DIFFERENTIAL/PLATELET
Abs Immature Granulocytes: 0.02 K/uL (ref 0.00–0.07)
Basophils Absolute: 0.1 K/uL (ref 0.0–0.1)
Basophils Relative: 1 %
Eosinophils Absolute: 0.3 K/uL (ref 0.0–0.5)
Eosinophils Relative: 5 %
HCT: 37.1 % (ref 36.0–46.0)
Hemoglobin: 11.7 g/dL — ABNORMAL LOW (ref 12.0–15.0)
Immature Granulocytes: 0 %
Lymphocytes Relative: 24 %
Lymphs Abs: 1.3 K/uL (ref 0.7–4.0)
MCH: 23.2 pg — ABNORMAL LOW (ref 26.0–34.0)
MCHC: 31.5 g/dL (ref 30.0–36.0)
MCV: 73.5 fL — ABNORMAL LOW (ref 80.0–100.0)
Monocytes Absolute: 0.4 K/uL (ref 0.1–1.0)
Monocytes Relative: 7 %
Neutro Abs: 3.6 K/uL (ref 1.7–7.7)
Neutrophils Relative %: 63 %
Platelets: 546 K/uL — ABNORMAL HIGH (ref 150–400)
RBC: 5.05 MIL/uL (ref 3.87–5.11)
RDW: 23.9 % — ABNORMAL HIGH (ref 11.5–15.5)
Smear Review: NORMAL
WBC: 5.7 K/uL (ref 4.0–10.5)
nRBC: 0 % (ref 0.0–0.2)

## 2024-07-11 LAB — COMPREHENSIVE METABOLIC PANEL WITH GFR
ALT: 23 U/L (ref 0–44)
AST: 28 U/L (ref 15–41)
Albumin: 4.3 g/dL (ref 3.5–5.0)
Alkaline Phosphatase: 95 U/L (ref 38–126)
Anion gap: 11 (ref 5–15)
BUN: 16 mg/dL (ref 8–23)
CO2: 24 mmol/L (ref 22–32)
Calcium: 9.9 mg/dL (ref 8.9–10.3)
Chloride: 105 mmol/L (ref 98–111)
Creatinine, Ser: 0.99 mg/dL (ref 0.44–1.00)
GFR, Estimated: 58 mL/min — ABNORMAL LOW (ref >60)
Glucose, Bld: 133 mg/dL — ABNORMAL HIGH (ref 70–99)
Potassium: 4.4 mmol/L (ref 3.5–5.1)
Sodium: 140 mmol/L (ref 135–145)
Total Bilirubin: 0.3 mg/dL (ref 0.0–1.2)
Total Protein: 7.5 g/dL (ref 6.5–8.1)

## 2024-07-11 LAB — POCT GLYCOSYLATED HEMOGLOBIN (HGB A1C): Hemoglobin A1C: 6.3 % — AB (ref 4.0–5.6)

## 2024-07-11 MED ORDER — PREDNISONE 10 MG (21) PO TBPK: ORAL_TABLET | ORAL | 0 refills | Status: DC

## 2024-07-11 MED ORDER — ALBUTEROL SULFATE (2.5 MG/3ML) 0.083% IN NEBU
2.5000 mg | INHALATION_SOLUTION | Freq: Once | RESPIRATORY_TRACT | Status: AC
  Administered 2024-07-11: 2.5 mg via RESPIRATORY_TRACT

## 2024-07-11 MED ORDER — BREZTRI AEROSPHERE 160-9-4.8 MCG/ACT IN AERO: 2.0000 | INHALATION_SPRAY | Freq: Two times a day (BID) | RESPIRATORY_TRACT | 5 refills | Status: DC

## 2024-07-11 MED ORDER — DEXAMETHASONE SOD PHOSPHATE PF 10 MG/ML IJ SOLN
10.0000 mg | Freq: Once | INTRAMUSCULAR | Status: AC
  Administered 2024-07-11: 10 mg via INTRAMUSCULAR

## 2024-07-11 MED ORDER — ALBUTEROL SULFATE HFA 108 (90 BASE) MCG/ACT IN AERS: 2.0000 | INHALATION_SPRAY | Freq: Four times a day (QID) | RESPIRATORY_TRACT | 2 refills | Status: DC | PRN

## 2024-07-11 MED ORDER — BUTALBITAL-APAP-CAFFEINE 50-325-40 MG PO TABS: 1.0000 | ORAL_TABLET | Freq: Four times a day (QID) | ORAL | 1 refills | Status: AC | PRN

## 2024-07-11 MED ORDER — LOSARTAN POTASSIUM 25 MG PO TABS: 25.0000 mg | ORAL_TABLET | Freq: Every evening | ORAL | 1 refills | Status: AC

## 2024-07-11 MED ORDER — CITALOPRAM HYDROBROMIDE 40 MG PO TABS: 40.0000 mg | ORAL_TABLET | Freq: Every day | ORAL | 1 refills | Status: AC

## 2024-07-11 MED ORDER — ACETAMINOPHEN 325 MG PO TABS
650.0000 mg | ORAL_TABLET | Freq: Once | ORAL | Status: AC
  Administered 2024-07-11: 650 mg via ORAL
  Filled 2024-07-11: qty 2

## 2024-07-11 MED ORDER — BREZTRI AEROSPHERE 160-9-4.8 MCG/ACT IN AERO: 2.0000 | INHALATION_SPRAY | Freq: Two times a day (BID) | RESPIRATORY_TRACT | 5 refills | Status: AC

## 2024-07-11 MED ORDER — DAPAGLIFLOZIN PROPANEDIOL 5 MG PO TABS: 5.0000 mg | ORAL_TABLET | Freq: Every day | ORAL | 2 refills | Status: DC

## 2024-07-11 MED ORDER — IPRATROPIUM-ALBUTEROL 0.5-2.5 (3) MG/3ML IN SOLN
3.0000 mL | Freq: Once | RESPIRATORY_TRACT | Status: AC
  Administered 2024-07-11: 3 mL via RESPIRATORY_TRACT
  Filled 2024-07-11: qty 3

## 2024-07-11 NOTE — ED Provider Notes (Signed)
 [facility] Provider Note    Event Date/Time   First MD Initiated Contact with Patient 07/11/24 1307     (approximate)   History   Shortness of Breath   HPI  Lori Potts is a 78 y.o. female with a history of hypertension, asthma who presents with shortness of breath, sent from PCPs office per review of records.  Apparently received a IM dose of steroids there and a nebulizer before being sent to the emergency department.     Physical Exam   Triage Vital Signs: ED Triage Vitals  Encounter Vitals Group     BP 07/11/24 1231 107/74     Girls Systolic BP Percentile --      Girls Diastolic BP Percentile --      Boys Systolic BP Percentile --      Boys Diastolic BP Percentile --      Pulse Rate 07/11/24 1231 67     Resp 07/11/24 1231 18     Temp 07/11/24 1329 98 F (36.7 C)     Temp Source 07/11/24 1329 Oral     SpO2 07/11/24 1231 98 %     Weight 07/11/24 1230 82.1 kg (181 lb)     Height 07/11/24 1230 1.6 m (5' 3)     Head Circumference --      Peak Flow --      Pain Score 07/11/24 1229 10     Pain Loc --      Pain Education --      Exclude from Growth Chart --     Most recent vital signs: Vitals:   07/11/24 1329 07/11/24 1555  BP:  113/60  Pulse:  61  Resp:  18  Temp: 98 F (36.7 C) 97.7 F (36.5 C)  SpO2:  95%     General: Awake, no distress.  CV:  Good peripheral perfusion.  Resp:  Normal effort.  Wheezing Abd:  No distention.  Other:  No intraoral swelling, normal pharynx   ED Results / Procedures / Treatments   Labs (all labs ordered are listed, but only abnormal results are displayed) Labs Reviewed  CBC WITH DIFFERENTIAL/PLATELET - Abnormal; Notable for the following components:      Result Value   Hemoglobin 11.7 (*)    MCV 73.5 (*)    MCH 23.2 (*)    RDW 23.9 (*)    Platelets 546 (*)    All other components within normal limits  COMPREHENSIVE METABOLIC PANEL WITH GFR - Abnormal; Notable for the following  components:   Glucose, Bld 133 (*)    GFR, Estimated 58 (*)    All other components within normal limits     EKG  ED ECG REPORT I, Lamar Price, the attending physician, personally viewed and interpreted this ECG.  Date: 07/11/2024  Rhythm: normal sinus rhythm QRS Axis: normal Intervals: normal ST/T Wave abnormalities: normal Narrative Interpretation: Occasional PVC    RADIOLOGY Chest x-ray viewed interpret by me, no acute abnormality    PROCEDURES:  Critical Care performed:   Procedures   MEDICATIONS ORDERED IN ED: Medications  ipratropium-albuterol  (DUONEB) 0.5-2.5 (3) MG/3ML nebulizer solution 3 mL (3 mLs Nebulization Given 07/11/24 1350)  ipratropium-albuterol  (DUONEB) 0.5-2.5 (3) MG/3ML nebulizer solution 3 mL (3 mLs Nebulization Given 07/11/24 1350)  acetaminophen  (TYLENOL ) tablet 650 mg (650 mg Oral Given 07/11/24 1404)     IMPRESSION / MDM / ASSESSMENT AND PLAN / ED COURSE  I reviewed the triage vital signs and the  nursing notes. Patient's presentation is most consistent with acute presentation with potential threat to life or bodily function.  Patient presents with shortness of breath as detailed above, exam suspicious for bronchospasm/asthma.  She reports there was some debate about whether she has a diagnosis of asthma or not.  We will treat with DuoNebs given that she has already received IM steroids.  Will obtain chest x-ray, labs  Differential includes asthma exacerbation, pneumonia, less likely CHF  Lab work is overall quite reassuring, chest x-ray not consistent with pneumonia.  ----------------------------------------- 4:19 PM on 07/11/2024 ----------------------------------------- Patient felt much better after treatment, no further wheezing, she is diabetic, we will forego p.o. outpatient antibiotics, she will use inhaler every 4-6 hours over the next 2 days, return precautions discussed, she agrees to this plan      FINAL CLINICAL  IMPRESSION(S) / ED DIAGNOSES   Final diagnoses:  Bronchospasm     Rx / DC Orders   ED Discharge Orders          Ordered    albuterol  (VENTOLIN  HFA) 108 (90 Base) MCG/ACT inhaler  Every 6 hours PRN        07/11/24 1556             Note:  This document was prepared using Dragon voice recognition software and may include unintentional dictation errors.   Arlander [place], MD 07/11/24 [phone]

## 2024-07-11 NOTE — Telephone Encounter (Signed)
 Medication refills sent to [facility] pharmacy for Breztri  and Farxiga .  Kaili Castille E. Marsh, PharmD, CPP Clinical Pharmacist [facility] [phone]

## 2024-07-11 NOTE — Progress Notes (Signed)
 BP 110/76   Pulse 67   Temp (!) 97 F (36.1 C)   Ht 5' 3 (1.6 m)   Wt 181 lb (82.1 kg)   SpO2 94%   BMI 32.06 kg/m    Subjective:    Patient ID: Lori Potts, female    DOB: 03/18/1946, 78 y.o.   MRN: [id number]  HPI: Lori Potts is a 78 y.o. female  Chief Complaint  Patient presents with   Medication Refill   Discussed the use of AI scribe software for clinical note transcription with the patient, who gave verbal consent to proceed.  History of Present Illness Lori Potts is a 78 year old female with asthma who presents with new onset shortness of breath.  Dyspnea and wheezing - New onset shortness of breath began this morning - Initially no wheezing, but later developed wheezing - Breathing difficulty improves when lying down and returns upon standing - No fever -denies any chest pain -oxygen saturation 94 % on room air  Asthma management - Persistent asthma - Uses Breztri  twice daily - Uses albuterol  inhaler as needed - Uses Singulair  10 mg daily at bedtime  Type 2 diabetes mellitus - Recent A1c 6.3%, improved from 6.7% in April - Takes Trulicity  0.75 mg weekly - Takes Farxiga  5 mg daily  Hypertension - Takes losartan  25 mg daily - Most recent blood pressure 110/76 mmHg  Migraine headaches - Uses Fioricet sparingly for acute episodes - Uses propranolol  20 mg three times a day for prevention - Takes Topamax  25 mg daily  Iron deficiency and anemia evaluation - Currently receiving iron infusions - Last iron infusion is today - Under care of oncology and cardiology - Referred to gastroenterology for colonoscopy due to low blood counts and iron levels  Sleep disturbance - Difficulty sleeping - Frequently awake at 2 or 4 AM  Other chronic medications - Aspirin  81 mg daily - Buspirone  5 mg three times a day as needed - Celexa  40 mg daily - Nexium  40 mg daily - Estradiol  vaginal cream - Hydroxyzine  25 mg as needed for anxiety -  Meclizine  25 mg as needed for vertigo - Robaxin  500 mg every six hours as needed - Zofran  as needed - Rosuvastatin  40 mg daily         07/11/2024   11:30 AM 06/14/2024    9:50 AM 06/12/2024    3:15 PM  Depression screen PHQ 2/9  Decreased Interest 0 0 0  Down, Depressed, Hopeless 0 0 0  PHQ - 2 Score 0 0 0  Altered sleeping 0    Tired, decreased energy 0    Change in appetite 0    Feeling bad or failure about yourself  0    Trouble concentrating 0    Moving slowly or fidgety/restless 0    Suicidal thoughts 0    PHQ-9 Score 0         12/19/2023   10:52 AM 03/20/2023    8:09 AM 01/02/2023    8:12 AM 09/20/2022   12:46 PM  GAD 7 : Generalized Anxiety Score  Nervous, Anxious, on Edge 0 1 0 1  Control/stop worrying 0 1 0 0  Worry too much - different things 0 0 0 0  Trouble relaxing 0 0 0 0  Restless 0 0 0 0  Easily annoyed or irritable 0 0 0 0  Afraid - awful might happen 0 0 0 0  Total GAD 7 Score 0 2 0 1  Anxiety Difficulty Not difficult at all Not difficult at all Not difficult at all Not difficult at all     Relevant past medical, surgical, family and social history reviewed and updated as indicated. Interim medical history since our last visit reviewed. Allergies and medications reviewed and updated.  Review of Systems  Ten systems reviewed and is negative except as mentioned in HPI      Objective:      BP 110/76   Pulse 67   Temp (!) 97 F (36.1 C)   Ht 5' 3 (1.6 m)   Wt 181 lb (82.1 kg)   SpO2 94%   BMI 32.06 kg/m    Wt Readings from Last 3 Encounters:  07/11/24 181 lb (82.1 kg)  06/12/24 179 lb 9.6 oz (81.5 kg)  05/22/24 183 lb 4.8 oz (83.1 kg)    Physical Exam VITALS: BP- 110/76, SaO2- 94% GENERAL: Alert, cooperative, well developed, no acute distress. HEENT: Normocephalic, normal oropharynx, moist mucous membranes. CHEST: Wheezing present, no rhonchi or rales CARDIOVASCULAR: Normal heart rate and rhythm, S1 and S2 normal without  murmurs. ABDOMEN: Soft, non-tender, non-distended, without organomegaly, normal bowel sounds. EXTREMITIES: No cyanosis or edema. NEUROLOGICAL: Cranial nerves grossly intact, moves all extremities without gross motor or sensory deficit.  Results for orders placed or performed in visit on 07/11/24  POCT HgB A1C   Collection Time: 07/11/24 11:43 AM  Result Value Ref Range   Hemoglobin A1C 6.3 (A) 4.0 - 5.6 %   HbA1c POC (<> result, manual entry)     HbA1c, POC (prediabetic range)     HbA1c, POC (controlled diabetic range)            Assessment & Plan:   Problem List Items Addressed This Visit       Cardiovascular and Mediastinum   Essential (primary) hypertension   Relevant Medications   losartan  (COZAAR ) 25 MG tablet   isosorbide mononitrate (IMDUR) 30 MG 24 hr tablet   Intractable migraine with aura without status migrainosus   Relevant Medications   losartan  (COZAAR ) 25 MG tablet   butalbital -acetaminophen -caffeine  (FIORICET) 50-325-40 MG tablet   citalopram  (CELEXA ) 40 MG tablet   isosorbide mononitrate (IMDUR) 30 MG 24 hr tablet     Respiratory   Allergic rhinitis   Moderate persistent asthma without complication   Relevant Medications   budesonide -glycopyrrolate -formoterol  (BREZTRI  AEROSPHERE) 160-9-4.8 MCG/ACT AERO inhaler   predniSONE  (STERAPRED UNI-PAK 21 TAB) 10 MG (21) TBPK tablet   dexamethasone  (DECADRON ) injection 10 mg (Start on 07/11/2024 12:00 PM)   albuterol  (PROVENTIL ) (2.5 MG/3ML) 0.083% nebulizer solution 2.5 mg (Start on 07/11/2024 12:00 PM)     Digestive   Gastroesophageal reflux disease without esophagitis     Endocrine   Type 2 diabetes mellitus with microalbuminuria, without long-term current use of insulin  (HCC) - Primary   Relevant Medications   losartan  (COZAAR ) 25 MG tablet   Other Relevant Orders   POCT HgB A1C (Completed)   Hyperlipidemia associated with type 2 diabetes mellitus (HCC)   Relevant Medications   losartan  (COZAAR ) 25 MG  tablet   isosorbide mononitrate (IMDUR) 30 MG 24 hr tablet     Musculoskeletal and Integument   OA (osteoarthritis) of hip   Relevant Medications   butalbital -acetaminophen -caffeine  (FIORICET) 50-325-40 MG tablet   predniSONE  (STERAPRED UNI-PAK 21 TAB) 10 MG (21) TBPK tablet   dexamethasone  (DECADRON ) injection 10 mg (Start on 07/11/2024 12:00 PM)   DDD (degenerative disc disease), cervical     Genitourinary   Stage  3a chronic kidney disease (HCC)     Other   Anxiety   Relevant Medications   citalopram  (CELEXA ) 40 MG tablet   Mild episode of recurrent major depressive disorder   Relevant Medications   citalopram  (CELEXA ) 40 MG tablet   Other Visit Diagnoses       Encounter for screening mammogram for malignant neoplasm of breast       Relevant Orders   MM 3D SCREENING MAMMOGRAM BILATERAL BREAST     Persistent asthma without complication, unspecified asthma severity       Relevant Medications   budesonide -glycopyrrolate -formoterol  (BREZTRI  AEROSPHERE) 160-9-4.8 MCG/ACT AERO inhaler   predniSONE  (STERAPRED UNI-PAK 21 TAB) 10 MG (21) TBPK tablet   dexamethasone  (DECADRON ) injection 10 mg (Start on 07/11/2024 12:00 PM)   albuterol  (PROVENTIL ) (2.5 MG/3ML) 0.083% nebulizer solution 2.5 mg (Start on 07/11/2024 12:00 PM)   Other Relevant Orders   DG Chest 2 View        Assessment and Plan Assessment & Plan Asthma with acute exacerbation/shortness of breath Acute exacerbation of asthma with sudden onset of dyspnea and wheezing. No fever or signs of infection. Oxygen saturation at 94%. - Administered breathing treatment in office - Administered steroid injection in office - Prescribed oral steroid pack to start the following day - Advised to go get a chest xray -if any worsening go to er -after breathing treatment patient continued to feel short of breath, decided it was best for her to go to the er.  She started gasping for air every 4th breath.  Lung sounds were clear, pulse  ox was still in normal range.  Ems called.   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 Recent A1c improved to 6.3%. Chronic kidney disease stage 3a with GFR of 48.  Iron deficiency anemia Managed with iron infusions. Oncology referral for colonoscopy to investigate potential gastrointestinal bleeding. - Continue iron infusions  Migraine Chronic migraines managed with Fioricet and propranolol . Recent use of Fioricet noted. - Refilled Fioricet prescription, doing well with current treatment plan  HTN - BP Readings from Last 3 Encounters:  07/11/24 110/76  07/09/24 101/60  06/24/24 139/65   Continue losartan  25 mg daily  Depression and anxiety disorder Depression and anxiety managed with Celexa  and buspirone . Recent refill needed for Celexa . - Refilled Celexa  prescription  Hld Lipid Panel     Component Value Date/Time   CHOL 163 12/19/2023 1111   TRIG 67 12/19/2023 1111   HDL 70 12/19/2023 1111   CHOLHDL 2.3 12/19/2023 1111   VLDL 18 07/04/2016 1016   LDLCALC 79 12/19/2023 1111   Continue rosuvastatin  40 mg daily  General Health Maintenance Overdue for mammogram.- order placed        Follow up plan: Return in about 6 months (around 01/08/2025) for follow up.

## 2024-07-11 NOTE — Telephone Encounter (Signed)
 Received a refill re-order request from [facility] Bre[facility]tri  and Farxiga  can be fax to [facility] at [phone].

## 2024-07-11 NOTE — ED Notes (Signed)
 Assisted pt to the bathroom at this time.

## 2024-07-11 NOTE — ED Triage Notes (Addendum)
 First Nurse Note:  Pt via [facility] from doctor office. Pt c/o SOB this AM. Doctor office gave albuterol  treatment. Pt has a hx of asthma and took her inhaler this AM. Pt also has a cough and pt reports some congestion but is not coughing anything up. Denies any sick contact. Reports some pain in her shoulder but denies any CP. Pt is A&Ox4 and NAD  EMS report 130/73, 96% on RA, 75 HR, 98.0 oral, 35 ETCO2

## 2024-07-11 NOTE — Addendum Note (Signed)
 Addended by: MARSH, Theresia Pree E on: 07/11/2024 04:09 PM   Modules accepted: Orders

## 2024-07-11 NOTE — ED Notes (Signed)
 See triage note  Presents with some SOB  States she was at the doctors office   Had a SVN treatment States min relief  Also having pain with inspiration   Afebrile on arrival

## 2024-07-12 ENCOUNTER — Telehealth (HOSPITAL_COMMUNITY): Payer: Self-pay | Admitting: *Deleted

## 2024-07-12 ENCOUNTER — Encounter (HOSPITAL_COMMUNITY): Payer: Self-pay

## 2024-07-12 NOTE — Telephone Encounter (Signed)
 Reaching out to patient to offer assistance regarding upcoming cardiac imaging study; pt verbalizes understanding of appt date/time, parking situation and where to check in, pre-test NPO status and medications ordered, and verified current allergies; name and call back number provided for further questions should they arise Lori Seats RN Navigator Cardiac Imaging [facility] [phone] office [phone] cell

## 2024-07-15 ENCOUNTER — Ambulatory Visit
Admission: RE | Admit: 2024-07-15 | Discharge: 2024-07-15 | Disposition: A | Discharge status: Home / Self Care | Source: Ambulatory Visit | Attending: Internal Medicine | Admitting: Internal Medicine

## 2024-07-15 DIAGNOSIS — R002 Palpitations: Secondary | ICD-10-CM | POA: Insufficient documentation

## 2024-07-15 DIAGNOSIS — R0602 Shortness of breath: Secondary | ICD-10-CM | POA: Diagnosis not present

## 2024-07-15 DIAGNOSIS — I2089 Other forms of angina pectoris: Secondary | ICD-10-CM | POA: Insufficient documentation

## 2024-07-15 MED ORDER — DILTIAZEM HCL 25 MG/5ML IV SOLN: 10.0000 mg | INTRAVENOUS | Status: DC | PRN

## 2024-07-15 MED ORDER — METOPROLOL TARTRATE 5 MG/5ML IV SOLN: 10.0000 mg | Freq: Once | INTRAVENOUS | Status: DC | PRN

## 2024-07-15 MED ORDER — NITROGLYCERIN 0.4 MG SL SUBL
0.8000 mg | SUBLINGUAL_TABLET | Freq: Once | SUBLINGUAL | Status: AC
  Administered 2024-07-15: 0.8 mg via SUBLINGUAL

## 2024-07-15 MED ORDER — IOHEXOL 350 MG/ML SOLN
100.0000 mL | Freq: Once | INTRAVENOUS | Status: AC | PRN
  Administered 2024-07-15: 100 mL via INTRAVENOUS

## 2024-07-15 NOTE — Progress Notes (Signed)
 Patient tolerated CT well. Vital signs stable encourage to drink water throughout day.Reasons explained and verbalized understanding. Ambulated steady gait.

## 2024-07-26 ENCOUNTER — Ambulatory Visit

## 2024-07-26 VITALS — Ht 63.0 in | Wt 181.0 lb

## 2024-07-26 DIAGNOSIS — Z0000 Encounter for general adult medical examination without abnormal findings: Secondary | ICD-10-CM | POA: Diagnosis not present

## 2024-07-26 NOTE — Progress Notes (Signed)
 I connected with  Lori Potts on 07/26/24 by a audio enabled telemedicine application and verified that I am speaking with the correct person using two identifiers.  Patient Location: Home  Provider Location: Home Office  Persons Participating in Visit: Patient.  I discussed the limitations of evaluation and management by telemedicine. The patient expressed understanding and agreed to proceed.  Vital Signs: Because this visit was a virtual/telehealth visit, some criteria may be missing or patient reported. Any vitals not documented were not able to be obtained and vitals that have been documented are patient reported.   Because this visit was a virtual/telehealth visit,  certain criteria was not obtained, such a blood pressure, CBG if applicable, and timed get up and go. Any medications not marked as taking were not mentioned during the medication reconciliation part of the visit. Any vitals not documented were not able to be obtained due to this being a telehealth visit or patient was unable to self-report a recent blood pressure reading due to a lack of equipment at home via telehealth. Vitals that have been documented are verbally provided by the patient.   This visit was performed by a medical professional under my direct supervision. I was immediately available for consultation/collaboration. I have reviewed and agree with the Annual Wellness Visit documentation.  Chief Complaint  Patient presents with   Medicare Wellness     Subjective:   Lori Potts is a 78 y.o. female who presents for a Medicare Annual Wellness Visit.  Visit info / Clinical Intake: Medicare Wellness Visit Type:: Subsequent Annual Wellness Visit Persons participating in visit and providing information:: patient Medicare Wellness Visit Mode:: Telephone If telephone:: video declined Since this visit was completed virtually, some vitals may be partially provided or unavailable. Missing vitals are due to  the limitations of the virtual format.: Unable to obtain vitals - no equipment (patient couldnt check BP only) If Telephone or Video please confirm:: I connected with patient using audio/video enable telemedicine. I verified patient identity with two identifiers, discussed telehealth limitations, and patient agreed to proceed. Patient Location:: home Provider Location:: home office Interpreter Needed?: No Pre-visit prep was completed: yes AWV questionnaire completed by patient prior to visit?: no Living arrangements:: lives with spouse/significant other Patient's Overall Health Status Rating: very good Typical amount of pain: some Does pain affect daily life?: no Are you currently prescribed opioids?: (!) yes  Dietary Habits and Nutritional Risks How many meals a day?: 3 Eats fruit and vegetables daily?: yes Most meals are obtained by: preparing own meals; having others provide food In the last 2 weeks, have you had any of the following?: none Diabetic:: (!) yes Any non-healing wounds?: no How often do you check your BS?: 1 Would you like to be referred to a Nutritionist or for Diabetic Management? : no  Functional Status Activities of Daily Living (to include ambulation/medication): Independent Ambulation: Independent Medication Administration: Independent Home Management (perform basic housework or laundry): Independent Manage your own finances?: yes Primary transportation is: driving Concerns about vision?: (!) yes (patient wears glasses) Concerns about hearing?: no  Fall Screening Falls in the past year?: 1 Number of falls in past year: 0 Was there an injury with Fall?: 1 Fall Risk Category Calculator: 2 Patient Fall Risk Level: Moderate Fall Risk  Fall Risk Patient at Risk for Falls Due to: Impaired balance/gait Fall risk Follow up: Falls evaluation completed; Falls prevention discussed; Education provided  Home and Transportation Safety: All rugs have non-skid  backing?: N/A,  no rugs All stairs or steps have railings?: yes Grab bars in the bathtub or shower?: yes Have non-skid surface in bathtub or shower?: yes Good home lighting?: yes Regular seat belt use?: yes Hospital stays in the last year:: no  Cognitive Assessment Difficulty concentrating, remembering, or making decisions? : no Will 6CIT or Mini Cog be Completed: yes What year is it?: 0 points What month is it?: 0 points Give patient an address phrase to remember (5 components): [address] [place], [place] About what time is it?: 0 points Count backwards from 20 to 1: 0 points Say the months of the year in reverse: 0 points Repeat the address phrase from earlier: 0 points 6 CIT Score: 0 points  Advance Directives (For Healthcare) Does Patient Have a Medical Advance Directive?: Yes Does patient want to make changes to medical advance directive?: No - Patient declined Type of Advance Directive: Healthcare Power of [place]; Living will Copy of Healthcare Power of Attorney in Chart?: No - copy requested Copy of Living Will in Chart?: No - copy requested  Reviewed/Updated  Reviewed/Updated: Reviewed All (Medical, Surgical, Family, Medications, Allergies, Care Teams, Patient Goals)    Allergies (verified) Metformin and related, Adhesive [tape], and Wound dressing adhesive   Current Medications (verified) Outpatient Encounter Medications as of 07/26/2024  Medication Sig   albuterol  (VENTOLIN  HFA) 108 (90 Base) MCG/ACT inhaler Inhale 2 puffs into the lungs every 6 (six) hours as needed for wheezing or shortness of breath.   Alcohol Swabs  (DROPSAFE ALCOHOL PREP) 70 % PADS USE TO TEST BLOOD SUGAR TWO TIMES DAILY   aspirin  81 MG chewable tablet CHEW 1 TABLET BY MOUTH 2 (TWO) TIMES DAILY FOR 20 DAYS THEN ONCE A DAY   Blood Glucose Monitoring Suppl (TRUE METRIX AIR GLUCOSE METER) w/Device KIT USE AS DIRECTED   budesonide -glycopyrrolate -formoterol  (BREZTRI  AEROSPHERE) 160-9-4.8 MCG/ACT  AERO inhaler Inhale 2 puffs into the lungs 2 (two) times daily.   busPIRone  (BUSPAR ) 5 MG tablet TAKE 1 TABLET THREE TIMES DAILY AS NEEDED   butalbital -acetaminophen -caffeine  (FIORICET) 50-325-40 MG tablet Take 1 tablet by mouth every 6 (six) hours as needed for headache.   citalopram  (CELEXA ) 40 MG tablet Take 1 tablet (40 mg total) by mouth daily.   Continuous Glucose Sensor (FREESTYLE LIBRE 3 SENSOR) MISC 1 Device by Does not apply route every 14 (fourteen) days.   dapagliflozin  propanediol (FARXIGA ) 5 MG TABS tablet Take 1 tablet (5 mg total) by mouth daily before breakfast.   Dulaglutide  (TRULICITY ) 0.75 MG/0.5ML SOPN Inject 0.75 mg into the skin once a week. Patient receives via [employer] Patient Assistance through Dec 2023   esomeprazole  (NEXIUM ) 40 MG capsule Take 1 capsule (40 mg total) by mouth at bedtime.   estradiol  (ESTRACE ) 0.1 MG/GM vaginal cream Place 1 Applicatorful vaginally 3 (three) times a week.   hydrOXYzine  (VISTARIL ) 25 MG capsule TAKE 1 CAPSULE EVERY 8 HOURS AS NEEDED   isosorbide mononitrate (IMDUR) 30 MG 24 hr tablet Take 30 mg by mouth.   losartan  (COZAAR ) 25 MG tablet Take 1 tablet (25 mg total) by mouth every evening.   meclizine  (ANTIVERT ) 25 MG tablet TAKE 1 TABLET BY MOUTH 3 TIMES DAILY AS NEEDED FOR DIZZINESS.   methocarbamol  (ROBAXIN ) 500 MG tablet Take 1 tablet (500 mg total) by mouth every 6 (six) hours as needed for muscle spasms.   montelukast  (SINGULAIR ) 10 MG tablet Take 1 tablet (10 mg total) by mouth at bedtime.   ondansetron  (ZOFRAN ) 4 MG tablet Take 1  tablet (4 mg total) by mouth every 6 (six) hours as needed for nausea.   oxyCODONE -acetaminophen  (PERCOCET) 10-325 MG tablet Take 1 tablet by mouth every 6 (six) hours as needed for pain.   predniSONE  (STERAPRED UNI-PAK 21 TAB) 10 MG (21) TBPK tablet Use as directed.   propranolol  (INDERAL ) 20 MG tablet TAKE 1 TABLET BY MOUTH THREE TIMES A DAY (Patient taking differently: Take 40 mg by mouth 3 (three)  times daily.)   rosuvastatin  (CRESTOR ) 40 MG tablet TAKE 1 TABLET AT BEDTIME   topiramate  (TOPAMAX ) 25 MG tablet Take 1 tablet (25 mg total) by mouth at bedtime.   triamcinolone  ointment (KENALOG ) 0.1 % Apply 1 Application topically 2 (two) times daily. To affected areas   TRUEplus Lancets 33G MISC TEST BLOOD SUGAR TWO TIMES DAILY   fluticasone  (FLONASE ) 50 MCG/ACT nasal spray Place 1 spray into both nostrils daily as needed.   No facility-administered encounter medications on file as of 07/26/2024.    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   Cataract    right eye but immature   Dyspnea    Essential hypertension, benign    takes Lisinopril -HCTZ daily   GERD (gastroesophageal reflux disease)    Headache    sinus   History of anemia due to chronic kidney disease    History of colon polyps    benign   History of migraine    History of shingles    Hyperlipidemia    takes Lipitor daily   Low back pain    Nocturia    S/P insertion of spinal cord stimulator    Seasonal allergies    takes Singulair  daily as needed   Type II or unspecified type diabetes mellitus without mention of complication, not stated as uncontrolled    Vitamin D  deficiency    takes Vit D weekly   Weakness    numbness and tingling left arm   Wears dentures    full upper and Potts   Past Surgical History:  Procedure Laterality Date   ABDOMINAL HYSTERECTOMY     ANTERIOR CERVICAL DECOMP/DISCECTOMY FUSION N/A 02/26/2015   Procedure: ANTERIOR CERVICAL DISCECTOMY FUSION C4-5 (1 LEVEL);  Surgeon: Donaciano Sprang, MD;  Location: [facility] OR;  Service: Orthopedics;  Laterality: N/A;   ANTERIOR LAT LUMBAR FUSION N/A 03/21/2013   Procedure: ANTERIOR LATERAL LUMBAR FUSION 1 LEVEL/ XLIF L3-L4 ;  Surgeon: Donaciano Sprang, MD;  Location: [facility] OR;  Service: Orthopedics;  Laterality: N/A;   ANTERIOR LATERAL LUMBAR FUSION WITH PERCUTANEOUS SCREW 1 LEVEL N/A  01/30/2023   Procedure: LATERAL INTERBODY FUSION LUMBAR TWO TO THREE;  Surgeon: Sprang Donaciano, MD;  Location: [facility] OR;  Service: Orthopedics;  Laterality: N/A;  5 HRS 3 C-BED LEFT TAP BLOCK WITH EXPAREL    APPENDECTOMY     AUGMENTATION MAMMAPLASTY Bilateral 1978   BACK SURGERY     BACK SURGERY     Lumbar fusion x 2   BREAST EXCISIONAL BIOPSY Right 1970   BREAST SURGERY  1990   Augementation   CARDIAC CATHETERIZATION  5/12   ef 55%   CARDIAC CATHETERIZATION  10/2010   [facility]; EF 55%   CARDIAC CATHETERIZATION Left 02/16/2016   Procedure: Left Heart Cath and Coronary Angiography;  Surgeon: Cara JONETTA Lovelace, MD;  Location: [facility] INVASIVE CV LAB;  Service: Cardiovascular;  Laterality: Left;   CATARACT EXTRACTION W/PHACO Right 03/06/2024   Procedure: PHACOEMULSIFICATION, CATARACT,  WITH IOL INSERTION 6.60 00:44.6;  Surgeon: Mittie Gaskin, MD;  Location: [facility] ORS;  Service: Ophthalmology;  Laterality: Right;   CATARACT EXTRACTION W/PHACO Left 03/20/2024   Procedure: PHACOEMULSIFICATION, CATARACT, WITH IOL INSERTION 7.53 00:55.7;  Surgeon: Mittie Gaskin, MD;  Location: [facility];  Service: Ophthalmology;  Laterality: Left;   COLONOSCOPY     COLONOSCOPY WITH PROPOFOL  N/A 04/10/2017   Procedure: COLONOSCOPY WITH PROPOFOL ;  Surgeon: Jinny Carmine, MD;  Location: [facility];  Service: Gastroenterology;  Laterality: N/A;  Diabetic - insulin    ESOPHAGOGASTRODUODENOSCOPY     JOINT REPLACEMENT     KIDNEY SURGERY  1998   growth removed from left kidney    LEFT HEART CATH AND CORONARY ANGIOGRAPHY Left 09/10/2018   Procedure: LEFT HEART CATH AND CORONARY ANGIOGRAPHY;  Surgeon: Florencio Cara BIRCH, MD;  Location: [facility] INVASIVE CV LAB;  Service: Cardiovascular;  Laterality: Left;   pain stimulator     POSTERIOR CERVICAL FUSION/FORAMINOTOMY Right 03/21/2013   Procedure: POSTERIOR L2-3 RIGHT FORAMINOTOMY;  Surgeon: Donaciano Sprang, MD;  Location: [facility] OR;  Service: Orthopedics;  Laterality:  Right;   REVISION OF SCAR TISSUE RECTUS MUSCLE     SHOULDER ARTHROSCOPY WITH ROTATOR CUFF REPAIR AND SUBACROMIAL DECOMPRESSION Right 09/17/2020   Procedure: RIGHT SHOULDER ARTHROSCOPY WITH MINI-OPEN ROTATOR CUFF REPAIR, DISTAL CLAVICLE EXCISION, AND SUBACROMIAL DECOMPRESSION WITH BICEP TENDONESIS;  Surgeon: Marchia Drivers, MD;  Location: [facility] ORS;  Service: Orthopedics;  Laterality: Right;   SMALL BOWEL REPAIR     SPINAL CORD STIMULATOR BATTERY EXCHANGE N/A 10/17/2012   Procedure: SPINAL CORD STIMULATOR BATTERY REMOVAL;  Surgeon: Donaciano Sprang, MD;  Location: [facility] OR;  Service: Orthopedics;  Laterality: N/A;   SPINAL CORD STIMULATOR BATTERY EXCHANGE N/A 07/22/2015   Procedure: REIMPLANTATION OF SPINAL CORD STIMULATOR BATTERY ;  Surgeon: Donaciano Sprang, MD;  Location: [facility] OR;  Service: Orthopedics;  Laterality: N/A;   TOTAL HIP ARTHROPLASTY Left 06/08/2022   Procedure: TOTAL HIP ARTHROPLASTY ANTERIOR APPROACH;  Surgeon: Melodi Lerner, MD;  Location: [facility]S;  Service: Orthopedics;  Laterality: Left;   TOTAL HIP ARTHROPLASTY Right 09/27/2023   Procedure: TOTAL HIP ARTHROPLASTY ANTERIOR APPROACH;  Surgeon: Melodi Lerner, MD;  Location: [facility];  Service: Orthopedics;  Laterality: Right;   TOTAL KNEE ARTHROPLASTY Left    TOTAL KNEE ARTHROPLASTY Right 07/28/2017   Procedure: RIGHT TOTAL KNEE ARTHROPLASTY;  Surgeon: Gerome Charleston, MD;  Location: [facility];  Service: Orthopedics;  Laterality: Right;   Family History  Problem Relation Age of Onset   Heart attack Mother    Sarcoidosis Sister    Social History   Occupational History   Not on file  Tobacco Use   Smoking status: Former    Current packs/day: 0.00    Types: Cigarettes    Quit date: 2006    Years since quitting: 19.9   Smokeless tobacco: Never   Tobacco comments:    quit smoking 83yrs ago  Vaping Use   Vaping status: Never Used  Substance and Sexual Activity   Alcohol use: No   Drug use: No   Sexual activity: Yes    Partners: Male     Birth control/protection: Surgical   Tobacco Counseling Counseling given: Not Answered Tobacco comments: quit smoking 3yrs ago  SDOH Screenings   Food Insecurity: No Food Insecurity (07/26/2024)  Housing: Low Risk  (07/26/2024)  Transportation Needs: No Transportation Needs (07/26/2024)  Utilities: Not At Risk (07/26/2024)  Alcohol Screen: Low Risk  (04/04/2024)  Depression (PHQ2-9): Low Risk  (07/26/2024)  Financial Resource  Strain: Low Risk  (04/04/2024)  Physical Activity: Patient Declined (07/26/2024)  Social Connections: Socially Integrated (07/26/2024)  Stress: No Stress Concern Present (07/26/2024)  Tobacco Use: Medium Risk (07/26/2024)  Health Literacy: Adequate Health Literacy (07/26/2024)   See flowsheets for full screening details  Depression Screen PHQ 2 & 9 Depression Scale- Over the past 2 weeks, how often have you been bothered by any of the following problems? Little interest or pleasure in doing things: 0 Feeling down, depressed, or hopeless (PHQ Adolescent also includes...irritable): 0 PHQ-2 Total Score: 0 Trouble falling or staying asleep, or sleeping too much: 1 Feeling tired or having little energy: 0 Poor appetite or overeating (PHQ Adolescent also includes...weight loss): 0 Feeling bad about yourself - or that you are a failure or have let yourself or your family down: 0 Trouble concentrating on things, such as reading the newspaper or watching television (PHQ Adolescent also includes...like school work): 0 Moving or speaking so slowly that other people could have noticed. Or the opposite - being so fidgety or restless that you have been moving around a lot more than usual: 0 Thoughts that you would be better off dead, or of hurting yourself in some way: 0 PHQ-9 Total Score: 1 If you checked off any problems, how difficult have these problems made it for you to do your work, take care of things at home, or get along with other people?: Not difficult at  all  Depression Treatment Depression Interventions/Treatment : EYV7-0 Score <4 Follow-up Not Indicated     Goals Addressed               This Visit's Progress     Patient Stated (pt-stated)        To lose weight              Objective:    Today's Vitals   07/26/24 0821  Weight: 181 lb (82.1 kg)  Height: 5' 3 (1.6 m)   Body mass index is 32.06 kg/m.  Hearing/Vision screen Hearing Screening - Comments:: No trouble hearing  Vision Screening - Comments:: Patient wears glasses . Patient states she goes to [facility] in [place]  Immunizations and Health Maintenance Health Maintenance  Topic Date Due   Zoster Vaccines- Shingrix (1 of 2) Never done   Mammogram  05/04/2018   COVID-19 Vaccine (7 - 2025-26 season) 04/22/2024   OPHTHALMOLOGY EXAM  07/12/2024   FOOT EXAM  08/29/2024   Diabetic kidney evaluation - Urine ACR  12/18/2024   HEMOGLOBIN A1C  01/08/2025   Medicare Annual Wellness (AWV)  04/04/2025   Diabetic kidney evaluation - eGFR measurement  07/11/2025   DTaP/Tdap/Td (2 - Td or Tdap) 05/07/2029   Pneumococcal Vaccine: 50+ Years  Completed   Influenza Vaccine  Completed   Bone Density Scan  Completed   Hepatitis C Screening  Completed   Meningococcal B Vaccine  Aged Out   Colonoscopy  Discontinued        Assessment/Plan:  This is a routine wellness examination for [facility].  Patient Care Team: Gareth Mliss FALCON, [name] as PCP - General (Nurse Practitioner) Bonner Ade, MD as Consulting Physician (Physical Medicine and Rehabilitation) Tamea Dedra CROME, MD as Consulting Physician (Pulmonary Disease) Leora Lynwood SAUNDERS, MD as Consulting Physician (Orthopedic Surgery) Melanee Annah BROCKS, MD as Consulting Physician (Oncology) [facility], [facility] (Optometry) Marsh, Allyson E, RPH-CPP as Pharmacist  I have personally reviewed and noted the following in the patient's chart:   Medical and social history Use of  alcohol, tobacco or illicit drugs  Current  medications and supplements including opioid prescriptions. Functional ability and status Nutritional status Physical activity Advanced directives List of other physicians Hospitalizations, surgeries, and ER visits in previous 12 months Vitals Screenings to include cognitive, depression, and falls Referrals and appointments  No orders of the defined types were placed in this encounter.  In addition, I have reviewed and discussed with patient certain preventive protocols, quality metrics, and best practice recommendations. A written personalized care plan for preventive services as well as general preventive health recommendations were provided to patient.   Lyle MARLA Right, [place]   07/26/2024   No follow-ups on file.  After Visit Summary: (MyChart) Due to this being a telephonic visit, the after visit summary with patients personalized plan was offered to patient via MyChart   Nurse Notes: nothing to report at this time.

## 2024-07-26 NOTE — Patient Instructions (Signed)
 Lori Potts,  Thank you for taking the time for your Medicare Wellness Visit. I appreciate your continued commitment to your health goals. Please review the care plan we discussed, and feel free to reach out if I can assist you further.  Please note that Annual Wellness Visits do not include a physical exam. Some assessments may be limited, especially if the visit was conducted virtually. If needed, we may recommend an in-person follow-up with your provider.  Ongoing Care Seeing your primary care provider every 3 to 6 months helps us  monitor your health and provide consistent, personalized care.   Referrals If a referral was made during today's visit and you haven't received any updates within two weeks, please contact the referred provider directly to check on the status.  Recommended Screenings:  Health Maintenance  Topic Date Due   Zoster (Shingles) Vaccine (1 of 2) Never done   Breast Cancer Screening  05/04/2018   COVID-19 Vaccine (7 - 2025-26 season) 04/22/2024   Eye exam for diabetics  07/12/2024   Complete foot exam   08/29/2024   Yearly kidney health urinalysis for diabetes  12/18/2024   Hemoglobin A1C  01/08/2025   Medicare Annual Wellness Visit  04/04/2025   Yearly kidney function blood test for diabetes  07/11/2025   DTaP/Tdap/Td vaccine (2 - Td or Tdap) 05/07/2029   Pneumococcal Vaccine for age over 70  Completed   Flu Shot  Completed   Osteoporosis screening with Bone Density Scan  Completed   Hepatitis C Screening  Completed   Meningitis B Vaccine  Aged Out   Colon Cancer Screening  Discontinued       07/11/2024   12:30 PM  Advanced Directives  Does Patient Have a Medical Advance Directive? Yes  Type of Estate Agent of Center;Living will    Vision: Annual vision screenings are recommended for early detection of glaucoma, cataracts, and diabetic retinopathy. These exams can also reveal signs of chronic conditions such as diabetes and  high blood pressure.  Dental: Annual dental screenings help detect early signs of oral cancer, gum disease, and other conditions linked to overall health, including heart disease and diabetes.  Please see the attached documents for additional preventive care recommendations.

## 2024-07-29 ENCOUNTER — Encounter: Payer: Self-pay | Admitting: Nurse Practitioner

## 2024-07-29 ENCOUNTER — Telehealth: Admitting: Nurse Practitioner

## 2024-07-29 DIAGNOSIS — J069 Acute upper respiratory infection, unspecified: Secondary | ICD-10-CM

## 2024-07-29 DIAGNOSIS — J4540 Moderate persistent asthma, uncomplicated: Secondary | ICD-10-CM

## 2024-07-29 MED ORDER — PROMETHAZINE-DM 6.25-15 MG/5ML PO SYRP: 5.0000 mL | ORAL_SOLUTION | Freq: Four times a day (QID) | ORAL | 0 refills | Status: DC | PRN

## 2024-07-29 NOTE — Progress Notes (Signed)
 Name: Lori Potts   MRN: [id number]    DOB: [birth year]   Date:07/29/2024       Progress Note  Subjective  Chief Complaint  Chief Complaint  Patient presents with   Cough    Pt c/o cough x3 days.     I connected with  Lori Potts Lower  on 07/29/24 at 11:40 AM EST by a video enabled telemedicine application and verified that I am speaking with the correct person using two identifiers.  I discussed the limitations of evaluation and management by telemedicine and the availability of in person appointments. The patient expressed understanding and agreed to proceed with a virtual visit  Staff also discussed with the patient that there may be a patient responsible charge related to this service. Patient Location: home Provider Location: [facility] Additional Individuals present: alone  HPI   Discussed the use of AI scribe software for clinical note transcription with the patient, who gave verbal consent to proceed.  History of Present Illness Lori Potts is a 78 year old female with asthma who presents with a cough and sweating for three days.  Cough and upper respiratory symptoms - Persistent cough for three days, onset Saturday - Sweating for three days - Runny nose present - No nasal congestion - No medications taken specifically for cough -denies any shortness of breath or fever  Asthma management - Asthma managed with Breztri  in the morning and at night - Albuterol  used every six hours - Nasal spray used for sinus symptoms, which helps control rhinorrhea    Patient Active Problem List   Diagnosis Date Noted   Iron  deficiency anemia 06/13/2024   Primary osteoarthritis of right hip 09/27/2023   DDD (degenerative disc disease), cervical 02/28/2023   S/P lumbar fusion 01/30/2023   Arthralgia of both knees 12/21/2022   Stage 3a chronic kidney disease (HCC) 09/15/2022   OA (osteoarthritis) of hip 06/08/2022   Primary osteoarthritis of left hip 06/08/2022   Anxiety  04/07/2022   Palpitations 04/07/2022   Mild episode of recurrent major depressive disorder 04/07/2022   Moderate persistent asthma without complication 02/16/2022   Intractable migraine with aura without status migrainosus 04/18/2020   Body mass index (BMI) 31.0-31.9, adult 02/06/2020   Chronic left hip pain 12/26/2019   Anemia 09/22/2017   S/P knee replacement 07/28/2017   Allergic rhinitis 05/26/2017   Vitamin D  deficiency 05/19/2017   Chronic nonintractable headache 05/18/2017   Advanced care planning/counseling discussion    Gastroesophageal reflux disease without esophagitis    Calcification of aorta 03/02/2017   Insomnia 11/29/2016   Abdominal wall pain in right flank 03/29/2016   Diverticulosis 02/24/2016   Type 2 diabetes mellitus with microalbuminuria, without long-term current use of insulin  (HCC) 06/16/2015   Hyperlipidemia associated with type 2 diabetes mellitus (HCC) 06/16/2015   Chronic pain 04/13/2015   Neck pain 02/26/2015   Status post lumbar surgery 02/26/2015   Abnormal ECG 01/27/2015   Essential (primary) hypertension 01/27/2015    Social History   Tobacco Use   Smoking status: Former    Current packs/day: 0.00    Types: Cigarettes    Quit date: 2006    Years since quitting: 19.9   Smokeless tobacco: Never   Tobacco comments:    quit smoking 66yrs ago  Substance Use Topics   Alcohol use: No     Current Outpatient Medications:    albuterol  (VENTOLIN  HFA) 108 (90 Base) MCG/ACT inhaler, Inhale 2 puffs into the lungs every 6 (six)  hours as needed for wheezing or shortness of breath., Disp: 8 g, Rfl: 2   Alcohol Swabs  (DROPSAFE ALCOHOL PREP) 70 % PADS, USE TO TEST BLOOD SUGAR TWO TIMES DAILY, Disp: 200 each, Rfl: 3   aspirin  81 MG chewable tablet, CHEW 1 TABLET BY MOUTH 2 (TWO) TIMES DAILY FOR 20 DAYS THEN ONCE A DAY, Disp: , Rfl:    Blood Glucose Monitoring Suppl (TRUE METRIX AIR GLUCOSE METER) w/Device KIT, USE AS DIRECTED, Disp: 1 kit, Rfl: 3    budesonide -glycopyrrolate -formoterol  (BREZTRI  AEROSPHERE) 160-9-4.8 MCG/ACT AERO inhaler, Inhale 2 puffs into the lungs 2 (two) times daily., Disp: 10.7 g, Rfl: 5   busPIRone  (BUSPAR ) 5 MG tablet, TAKE 1 TABLET THREE TIMES DAILY AS NEEDED, Disp: 270 tablet, Rfl: 3   butalbital -acetaminophen -caffeine  (FIORICET) 50-325-40 MG tablet, Take 1 tablet by mouth every 6 (six) hours as needed for headache., Disp: 14 tablet, Rfl: 1   citalopram  (CELEXA ) 40 MG tablet, Take 1 tablet (40 mg total) by mouth daily., Disp: 90 tablet, Rfl: 1   Continuous Glucose Sensor (FREESTYLE LIBRE 3 SENSOR) MISC, 1 Device by Does not apply route every 14 (fourteen) days., Disp: 2 each, Rfl: 11   dapagliflozin  propanediol (FARXIGA ) 5 MG TABS tablet, Take 1 tablet (5 mg total) by mouth daily before breakfast., Disp: 90 tablet, Rfl: 2   Dulaglutide  (TRULICITY ) 0.75 MG/0.5ML SOPN, Inject 0.75 mg into the skin once a week. Patient receives via [employer] Patient Assistance through Dec 2023, Disp: 6 mL, Rfl: 1   esomeprazole  (NEXIUM ) 40 MG capsule, Take 1 capsule (40 mg total) by mouth at bedtime., Disp: 90 capsule, Rfl: 1   estradiol  (ESTRACE ) 0.1 MG/GM vaginal cream, Place 1 Applicatorful vaginally 3 (three) times a week., Disp: 42.5 g, Rfl: 12   fluticasone  (FLONASE ) 50 MCG/ACT nasal spray, Place 1 spray into both nostrils daily as needed., Disp: , Rfl:    hydrOXYzine  (VISTARIL ) 25 MG capsule, TAKE 1 CAPSULE EVERY 8 HOURS AS NEEDED, Disp: 30 capsule, Rfl: 0   isosorbide mononitrate (IMDUR) 30 MG 24 hr tablet, Take 30 mg by mouth., Disp: , Rfl:    losartan  (COZAAR ) 25 MG tablet, Take 1 tablet (25 mg total) by mouth every evening., Disp: 90 tablet, Rfl: 1   meclizine  (ANTIVERT ) 25 MG tablet, TAKE 1 TABLET BY MOUTH 3 TIMES DAILY AS NEEDED FOR DIZZINESS., Disp: 90 tablet, Rfl: 0   methocarbamol  (ROBAXIN ) 500 MG tablet, Take 1 tablet (500 mg total) by mouth every 6 (six) hours as needed for muscle spasms., Disp: 40 tablet, Rfl: 0    montelukast  (SINGULAIR ) 10 MG tablet, Take 1 tablet (10 mg total) by mouth at bedtime., Disp: 90 tablet, Rfl: 1   ondansetron  (ZOFRAN ) 4 MG tablet, Take 1 tablet (4 mg total) by mouth every 6 (six) hours as needed for nausea., Disp: 20 tablet, Rfl: 0   oxyCODONE -acetaminophen  (PERCOCET) 10-325 MG tablet, Take 1 tablet by mouth every 6 (six) hours as needed for pain., Disp: , Rfl:    predniSONE  (STERAPRED UNI-PAK 21 TAB) 10 MG (21) TBPK tablet, Use as directed., Disp: 21 each, Rfl: 0   promethazine -dextromethorphan (PROMETHAZINE -DM) 6.25-15 MG/5ML syrup, Take 5 mLs by mouth 4 (four) times daily as needed for cough., Disp: 118 mL, Rfl: 0   propranolol  (INDERAL ) 20 MG tablet, TAKE 1 TABLET BY MOUTH THREE TIMES A DAY (Patient taking differently: Take 40 mg by mouth 3 (three) times daily.), Disp: 90 tablet, Rfl: 0   rosuvastatin  (CRESTOR ) 40 MG tablet, TAKE 1 TABLET  AT BEDTIME, Disp: 90 tablet, Rfl: 3   topiramate  (TOPAMAX ) 25 MG tablet, Take 1 tablet (25 mg total) by mouth at bedtime., Disp: 90 tablet, Rfl: 1   triamcinolone  ointment (KENALOG ) 0.1 %, Apply 1 Application topically 2 (two) times daily. To affected areas, Disp: 60 g, Rfl: 0   TRUEplus Lancets 33G MISC, TEST BLOOD SUGAR TWO TIMES DAILY, Disp: 200 each, Rfl: 0  Allergies  Allergen Reactions   Metformin And Related Diarrhea   Adhesive [Tape] Rash and Other (See Comments)    Regular tape is ok, allergy is to paper tape   Wound Dressing Adhesive Dermatitis and Other (See Comments)    Regular tape is ok, allergy is to paper tape    I personally reviewed active problem list, medication list, allergies with the patient/caregiver today.  ROS  Ten systems reviewed and is negative except as mentioned in HPI   Objective  Virtual encounter, vitals not obtained.  There is no height or weight on file to calculate BMI.  Nursing Note and Vital Signs reviewed.  Physical Exam  Awake, alert and oriented, speaking in complete sentences    No results found for this or any previous visit (from the past 72 hours).  Assessment & Plan  Assessment and Plan Assessment & Plan Acute upper respiratory infection Cough and rhinorrhea for three days. No fever or sinus congestion. Cough is causing discomfort and soreness. - Prescribed cough syrup, phenergan  DM - Recommended plain Mucinex  - Encouraged increased fluid intake and rest  Moderate persistent asthma Asthma managed with Breztri  and albuterol . - Continue Breztri  in the morning and at night - Continue albuterol  every six hours as needed      -Red flags and when to present for emergency care or RTC including fever >101.71F, chest pain, shortness of breath, new/worsening/un-resolving symptoms,  reviewed with patient at time of visit. Follow up and care instructions discussed and provided in AVS. - I discussed the assessment and treatment plan with the patient. The patient was provided an opportunity to ask questions and all were answered. The patient agreed with the plan and demonstrated an understanding of the instructions.  I provided 15 minutes of non-face-to-face time during this encounter.  Mliss JULIANNA Spray, [name]

## 2024-08-06 ENCOUNTER — Other Ambulatory Visit: Payer: Self-pay | Admitting: Nurse Practitioner

## 2024-08-06 DIAGNOSIS — R21 Rash and other nonspecific skin eruption: Secondary | ICD-10-CM

## 2024-08-09 NOTE — Telephone Encounter (Signed)
 No longer listed on current medication list Requested Prescriptions  Pending Prescriptions Disp Refills   famotidine  (PEPCID ) 20 MG tablet [Pharmacy Med Name: FAMOTIDINE  20 MG TABLET] 180 tablet 0    Sig: TAKE 1 TABLET BY MOUTH TWICE A DAY     Gastroenterology:  H2 Antagonists Passed - 08/09/2024  1:06 PM      Passed - Valid encounter within last 12 months    Recent Outpatient Visits           1 week ago Viral upper respiratory tract infection   [facility] Gareth Mliss [place], [name]   4 weeks ago Shortness of breath   [facility] Gareth Mliss [place], [name]   3 months ago Urinary frequency   [facility] Gareth Mliss FAL[place], [name]   4 months ago Shortness of breath   [facility] Gareth Mliss [place], [name]   7 months ago Rash   [facility] Gareth Mliss [place], [name]

## 2024-08-12 ENCOUNTER — Inpatient Hospital Stay: Attending: Oncology

## 2024-08-16 ENCOUNTER — Telehealth: Payer: Self-pay

## 2024-08-16 NOTE — Progress Notes (Signed)
 Contacted [employer] regarding update on re-enrollment status for Trulicity . Per representative, no re-enrollment documents were received.   Will request PAP to re-fax re-enrollment paperwork.  Malisa Ruggiero E. Marsh, PharmD, CPP Clinical Pharmacist [facility] [phone]

## 2024-08-20 NOTE — Telephone Encounter (Signed)
 Faxed full application Lori Potts (Trulicity ) to [employer] today, pt and provider portion,proof of income,Ins card.

## 2024-08-26 ENCOUNTER — Other Ambulatory Visit: Payer: Self-pay | Admitting: Nurse Practitioner

## 2024-08-26 DIAGNOSIS — E0829 Diabetes mellitus due to underlying condition with other diabetic kidney complication: Secondary | ICD-10-CM

## 2024-08-27 NOTE — Telephone Encounter (Signed)
 Requested medications are due for refill today.  unsure  Requested medications are on the active medications list.  yes  Last refill. 03/20/2023 6mL 1 rf  Future visit scheduled.   yes  Notes to clinic. Please review for refill.    Requested Prescriptions  Pending Prescriptions Disp Refills   TRULICITY  0.75 MG/0.5ML SOAJ [Pharmacy Med Name: Trulicity  Subcutaneous Solution Auto-injector 0.75 MG/0.5ML] 8 mL 0    Sig: INJECT 0.75MG  (0.5ML) UNDER THE SKIN ONCE A WEEK.     Endocrinology:  Diabetes - GLP-1 Receptor Agonists Passed - 08/27/2024  1:32 PM      Passed - HBA1C is between 0 and 7.9 and within 180 days    Hemoglobin A1C  Date Value Ref Range Status  07/11/2024 6.3 (A) 4.0 - 5.6 % Final  03/31/2019 6.6  Final   Hgb A1c MFr Bld  Date Value Ref Range Status  12/19/2023 6.7 (H) <5.7 % Final    Comment:    For someone without known diabetes, a hemoglobin A1c value of 6.5% or greater indicates that they may have  diabetes and this should be confirmed with a follow-up  test. . For someone with known diabetes, a value <7% indicates  that their diabetes is well controlled and a value  greater than or equal to 7% indicates suboptimal  control. A1c targets should be individualized based on  duration of diabetes, age, comorbid conditions, and  other considerations. . Currently, no consensus exists regarding use of hemoglobin A1c for diagnosis of diabetes for children. SABRA Amy - Valid encounter within last 6 months    Recent Outpatient Visits           4 weeks ago Viral upper respiratory tract infection   [facility] Gareth Mliss [facility], [name]   1 month ago Shortness of breath   [facility] Gareth Mliss [facility], [name]   3 months ago Urinary frequency   [facility] Gareth Mliss F[facility], [name]   5 months ago Shortness of breath   [facility] Gareth Mliss F[facility], [name]   8 months  ago Rash   [facility] Gareth Mliss [place], [name]

## 2024-08-29 ENCOUNTER — Other Ambulatory Visit: Payer: Self-pay

## 2024-08-29 ENCOUNTER — Telehealth: Admitting: Nurse Practitioner

## 2024-08-29 ENCOUNTER — Encounter: Payer: Self-pay | Admitting: Nurse Practitioner

## 2024-08-29 DIAGNOSIS — J45909 Unspecified asthma, uncomplicated: Secondary | ICD-10-CM

## 2024-08-29 DIAGNOSIS — J069 Acute upper respiratory infection, unspecified: Secondary | ICD-10-CM | POA: Diagnosis not present

## 2024-08-29 DIAGNOSIS — J4540 Moderate persistent asthma, uncomplicated: Secondary | ICD-10-CM

## 2024-08-29 DIAGNOSIS — L209 Atopic dermatitis, unspecified: Secondary | ICD-10-CM | POA: Diagnosis not present

## 2024-08-29 DIAGNOSIS — J40 Bronchitis, not specified as acute or chronic: Secondary | ICD-10-CM

## 2024-08-29 MED ORDER — PREDNISONE 10 MG (21) PO TBPK: ORAL_TABLET | ORAL | 0 refills | Status: DC

## 2024-08-29 MED ORDER — PROMETHAZINE-DM 6.25-15 MG/5ML PO SYRP: 5.0000 mL | ORAL_SOLUTION | Freq: Four times a day (QID) | ORAL | 0 refills | Status: DC | PRN

## 2024-08-29 MED ORDER — AZITHROMYCIN 250 MG PO TABS: ORAL_TABLET | ORAL | 0 refills | Status: DC

## 2024-08-29 NOTE — Progress Notes (Signed)
 "  Name: Lori Potts   MRN: [id number]    DOB: 17-Jan-1946   Date:08/29/2024       Progress Note  Subjective  Chief Complaint  Chief Complaint  Patient presents with   Cough    Congested, headache, wheezing for 1 week    I connected with  Lori Potts Lower  on 08/29/2024 at 11:20 AM EST by a video enabled telemedicine application and verified that I am speaking with the correct person using two identifiers.  I discussed the limitations of evaluation and management by telemedicine and the availability of in person appointments. The patient expressed understanding and agreed to proceed with a virtual visit  Staff also discussed with the patient that there may be a patient responsible charge related to this service. Patient Location: home Provider Location: [facility] Additional Individuals present: alone  HPI   Discussed the use of AI scribe software for clinical note transcription with the patient, who gave verbal consent to proceed.  History of Present Illness [name] is a 79 year old female with asthma who presents with cough, congestion, headache, and wheezing for one week.  Respiratory symptoms - Cough, congestion, headache, and wheezing for one week - No significant shortness of breath - Uses albuterol  inhaler as needed - Takes Breztri  daily - Recently received three inhalers in the mail - Has been treated for bronchitis in the past - Was taking cough medicine but has run out - No other medications taken for current symptoms  Ocular symptoms - Frequent eye rubbing - Bruising noted around the eye - Uses eye drops for relief  Allergic symptoms - Takes Singulair  for allergies - Has not been taking Zyrtec  or Claritin   Exposure history - Only one sick in household, but many family members are also ill    Patient Active Problem List   Diagnosis Date Noted   Iron  deficiency anemia 06/13/2024   Primary osteoarthritis of right hip 09/27/2023   DDD (degenerative disc  disease), cervical 02/28/2023   S/P lumbar fusion 01/30/2023   Arthralgia of both knees 12/21/2022   Stage 3a chronic kidney disease (HCC) 09/15/2022   OA (osteoarthritis) of hip 06/08/2022   Primary osteoarthritis of left hip 06/08/2022   Anxiety 04/07/2022   Palpitations 04/07/2022   Mild episode of recurrent major depressive disorder 04/07/2022   Moderate persistent asthma without complication 02/16/2022   Intractable migraine with aura without status migrainosus 04/18/2020   Body mass index (BMI) 31.0-31.9, adult 02/06/2020   Chronic left hip pain 12/26/2019   Anemia 09/22/2017   S/P knee replacement 07/28/2017   Allergic rhinitis 05/26/2017   Vitamin D  deficiency 05/19/2017   Chronic nonintractable headache 05/18/2017   Advanced care planning/counseling discussion    Gastroesophageal reflux disease without esophagitis    Calcification of aorta 03/02/2017   Insomnia 11/29/2016   Abdominal wall pain in right flank 03/29/2016   Diverticulosis 02/24/2016   Type 2 diabetes mellitus with microalbuminuria, without long-term current use of insulin  (HCC) 06/16/2015   Hyperlipidemia associated with type 2 diabetes mellitus (HCC) 06/16/2015   Chronic pain 04/13/2015   Neck pain 02/26/2015   Status post lumbar surgery 02/26/2015   Abnormal ECG 01/27/2015   Essential (primary) hypertension 01/27/2015    Social History   Tobacco Use   Smoking status: Former    Current packs/day: 0.00    Types: Cigarettes    Quit date: 2006    Years since quitting: 20.0   Smokeless tobacco: Never   Tobacco comments:  quit smoking 41yrs ago  Substance Use Topics   Alcohol use: No    Current Medications[1]  Allergies[2]  I personally reviewed active problem list, medication list, allergies, notes from last encounter with the patient/caregiver today.  ROS  Ten systems reviewed and is negative except as mentioned in HPI   Objective  Virtual encounter, vitals not obtained.  There is  no height or weight on file to calculate BMI.  Nursing Note and Vital Signs reviewed.  Physical Exam  Awake, alert and oriented, speaking in complete sentences   No results found for this or any previous visit (from the past 72 hours).  Assessment & Plan  Assessment and Plan Assessment & Plan Acute bronchitis and viral upper respiratory infection in a patient with moderate persistent asthma Cough, congestion, headache, and wheezing for one week. Moderate persistent asthma with current wheezing. No significant shortness of breath. Family members also experiencing similar symptoms, suggesting a viral etiology. - Prescribed promethazine  DM for cough. -sterapred and azithromycin  - Recommended taking an allergy pill such as Zyrtec , Claritin , or Allegra . - Advised taking plain Mucinex . - Encouraged increased fluid intake and rest.  Eye irritation due to rubbing Eye irritation with bruising due to frequent rubbing. Currently using eye drops. - Advised against rubbing the eye to prevent bruising.      -Red flags and when to present for emergency care or RTC including fever >101.75F, chest pain, shortness of breath, new/worsening/un-resolving symptoms,  reviewed with patient at time of visit. Follow up and care instructions discussed and provided in AVS. - I discussed the assessment and treatment plan with the patient. The patient was provided an opportunity to ask questions and all were answered. The patient agreed with the plan and demonstrated an understanding of the instructions.  I provided 15 minutes of non-face-to-face time during this encounter.  Gargi Berch F Saagar Tortorella, [name]      [1]  Current Outpatient Medications:    albuterol  (VENTOLIN  HFA) 108 (90 Base) MCG/ACT inhaler, Inhale 2 puffs into the lungs every 6 (six) hours as needed for wheezing or shortness of breath., Disp: 8 g, Rfl: 2   aspirin  81 MG chewable tablet, CHEW 1 TABLET BY MOUTH 2 (TWO) TIMES DAILY FOR 20 DAYS THEN ONCE A  DAY, Disp: , Rfl:    azithromycin  (ZITHROMAX ) 250 MG tablet, Take 2 tablets on day 1, then 1 tablet daily on days 2 through 5, Disp: 6 tablet, Rfl: 0   budesonide -glycopyrrolate -formoterol  (BREZTRI  AEROSPHERE) 160-9-4.8 MCG/ACT AERO inhaler, Inhale 2 puffs into the lungs 2 (two) times daily., Disp: 10.7 g, Rfl: 5   busPIRone  (BUSPAR ) 5 MG tablet, TAKE 1 TABLET THREE TIMES DAILY AS NEEDED, Disp: 270 tablet, Rfl: 3   butalbital -acetaminophen -caffeine  (FIORICET) 50-325-40 MG tablet, Take 1 tablet by mouth every 6 (six) hours as needed for headache., Disp: 14 tablet, Rfl: 1   citalopram  (CELEXA ) 40 MG tablet, Take 1 tablet (40 mg total) by mouth daily., Disp: 90 tablet, Rfl: 1   dapagliflozin  propanediol (FARXIGA ) 5 MG TABS tablet, Take 1 tablet (5 mg total) by mouth daily before breakfast., Disp: 90 tablet, Rfl: 2   Dulaglutide  (TRULICITY ) 0.75 MG/0.5ML SOAJ, INJECT 0.75MG  (0.5ML) UNDER THE SKIN ONCE A WEEK., Disp: 8 mL, Rfl: 5   esomeprazole  (NEXIUM ) 40 MG capsule, Take 1 capsule (40 mg total) by mouth at bedtime., Disp: 90 capsule, Rfl: 1   estradiol  (ESTRACE ) 0.1 MG/GM vaginal cream, Place 1 Applicatorful vaginally 3 (three) times a week., Disp: 42.5 g, Rfl: 12  fluticasone  (FLONASE ) 50 MCG/ACT nasal spray, Place 1 spray into both nostrils daily as needed., Disp: , Rfl:    hydrOXYzine  (VISTARIL ) 25 MG capsule, TAKE 1 CAPSULE EVERY 8 HOURS AS NEEDED, Disp: 30 capsule, Rfl: 0   isosorbide mononitrate (IMDUR) 30 MG 24 hr tablet, Take 30 mg by mouth., Disp: , Rfl:    losartan  (COZAAR ) 25 MG tablet, Take 1 tablet (25 mg total) by mouth every evening., Disp: 90 tablet, Rfl: 1   meclizine  (ANTIVERT ) 25 MG tablet, TAKE 1 TABLET BY MOUTH 3 TIMES DAILY AS NEEDED FOR DIZZINESS., Disp: 90 tablet, Rfl: 0   methocarbamol  (ROBAXIN ) 500 MG tablet, Take 1 tablet (500 mg total) by mouth every 6 (six) hours as needed for muscle spasms., Disp: 40 tablet, Rfl: 0   montelukast  (SINGULAIR ) 10 MG tablet, Take 1 tablet (10  mg total) by mouth at bedtime., Disp: 90 tablet, Rfl: 1   ondansetron  (ZOFRAN ) 4 MG tablet, Take 1 tablet (4 mg total) by mouth every 6 (six) hours as needed for nausea., Disp: 20 tablet, Rfl: 0   oxyCODONE -acetaminophen  (PERCOCET) 10-325 MG tablet, Take 1 tablet by mouth every 6 (six) hours as needed for pain., Disp: , Rfl:    propranolol  (INDERAL ) 20 MG tablet, TAKE 1 TABLET BY MOUTH THREE TIMES A DAY (Patient taking differently: Take 40 mg by mouth 3 (three) times daily.), Disp: 90 tablet, Rfl: 0   rosuvastatin  (CRESTOR ) 40 MG tablet, TAKE 1 TABLET AT BEDTIME, Disp: 90 tablet, Rfl: 3   topiramate  (TOPAMAX ) 25 MG tablet, Take 1 tablet (25 mg total) by mouth at bedtime., Disp: 90 tablet, Rfl: 1   triamcinolone  ointment (KENALOG ) 0.1 %, Apply 1 Application topically 2 (two) times daily. To affected areas, Disp: 60 g, Rfl: 0   Alcohol Swabs  (DROPSAFE ALCOHOL PREP) 70 % PADS, USE TO TEST BLOOD SUGAR TWO TIMES DAILY, Disp: 200 each, Rfl: 3   Blood Glucose Monitoring Suppl (TRUE METRIX AIR GLUCOSE METER) w/Device KIT, USE AS DIRECTED, Disp: 1 kit, Rfl: 3   Continuous Glucose Sensor (FREESTYLE LIBRE 3 SENSOR) MISC, 1 Device by Does not apply route every 14 (fourteen) days., Disp: 2 each, Rfl: 11   predniSONE  (STERAPRED UNI-PAK 21 TAB) 10 MG (21) TBPK tablet, Use as directed., Disp: 21 each, Rfl: 0   promethazine -dextromethorphan (PROMETHAZINE -DM) 6.25-15 MG/5ML syrup, Take 5 mLs by mouth 4 (four) times daily as needed for cough., Disp: 118 mL, Rfl: 0   TRUEplus Lancets 33G MISC, TEST BLOOD SUGAR TWO TIMES DAILY, Disp: 200 each, Rfl: 0 [2]  Allergies Allergen Reactions   Metformin And Related Diarrhea   Adhesive [Tape] Rash and Other (See Comments)    Regular tape is ok, allergy is to paper tape   Wound Dressing Adhesive Dermatitis and Other (See Comments)    Regular tape is ok, allergy is to paper tape   "

## 2024-09-02 ENCOUNTER — Other Ambulatory Visit: Payer: Self-pay | Admitting: Nurse Practitioner

## 2024-09-02 ENCOUNTER — Other Ambulatory Visit (INDEPENDENT_AMBULATORY_CARE_PROVIDER_SITE_OTHER)

## 2024-09-02 DIAGNOSIS — K219 Gastro-esophageal reflux disease without esophagitis: Secondary | ICD-10-CM

## 2024-09-02 DIAGNOSIS — R809 Proteinuria, unspecified: Secondary | ICD-10-CM

## 2024-09-02 DIAGNOSIS — Z7984 Long term (current) use of oral hypoglycemic drugs: Secondary | ICD-10-CM

## 2024-09-02 DIAGNOSIS — E1129 Type 2 diabetes mellitus with other diabetic kidney complication: Secondary | ICD-10-CM

## 2024-09-02 DIAGNOSIS — N1831 Chronic kidney disease, stage 3a: Secondary | ICD-10-CM

## 2024-09-02 MED ORDER — DAPAGLIFLOZIN PROPANEDIOL 10 MG PO TABS: 10.0000 mg | ORAL_TABLET | Freq: Every day | ORAL | 3 refills | Status: AC

## 2024-09-02 MED ORDER — ESOMEPRAZOLE MAGNESIUM 40 MG PO CPDR: 40.0000 mg | DELAYED_RELEASE_CAPSULE | Freq: Every day | ORAL | 1 refills | Status: AC

## 2024-09-02 NOTE — Progress Notes (Signed)
 "  S:     Reason for visit: ?  Lori Potts is a 79 y.o. female with a history of diabetes (type 2), who presents today for a follow up diabetes Telephone pharmacotherapy visit.? Pertinent PMH also includes HLD, asthma, ASCVD, osteoarthritis, CKD stage 3a.  They were referred to the pharmacist by their PCP for assistance in managing medication access.  Care Team: Primary Care Provider: Gareth Mliss FALCON, [name]  At last visit with cardiology on 08/23/24, patient was instructed to increase Farxiga  from 5 mg to 10 mg daily. She was told to stop propranolol  and initiate spironolactone 25 mg daily   Today, she reports she needs a refill of Farxiga  10 mg sent to [facility]. She is also requesting a refill of esomeprazole    Current diabetes medications include: Trulicity  0.75 mg weekly, Farxiga  10 mg daily  Previous diabetes medications include: Ozempic  Current hypertension medications include: losartan  25 mg daily, spironolactone 25 mg daily, furosemide 20 mg daily Current hyperlipidemia medications include: rosuvastatin  40 mg daily    Patient reports adherence to taking all medications as prescribed.   Have you been experiencing any side effects to the medications prescribed? no Do you have any problems obtaining medications due to transportation or finances? no Insurance coverage: [employer]  Current medication access support:  Trulicity  via [employer] until 08/21/25 Breztri  & Farxiga  via [employer] until 08/21/25  Patient denies hypoglycemic events.  Reported home fasting blood sugars: 110-135 mg/dL  Reported 2 hour post-meal/random blood sugars: 140 mg/dL.  DM Prevention:  Statin: Taking; high intensity.?  ACE/ARB: yes; losartan  History of chronic kidney disease? yes Last urinary albumin /creatinine ratio:  Lab Results  Component Value Date   MICRALBCREAT 215 (H) 12/19/2023   MICRALBCREAT 104 (H) 09/12/2023   MICRALBCREAT 205 (H) 05/31/2022   MICRALBCREAT 47 (H)  05/09/2018   MICRALBCREAT 298 (H) 01/09/2018   Last eye exam:  Lab Results  Component Value Date   HMDIABEYEEXA No Retinopathy 07/13/2023   Lab Results  Component Value Date   HMDIABEYEEXA No Retinopathy 07/13/2023   Last foot exam: 05/23/2022 Tobacco Use:  Tobacco Use: Medium Risk (08/29/2024)   Patient History    Smoking Tobacco Use: Former    Smokeless Tobacco Use: Never    Passive Exposure: Not on file   O:   Vitals:  Wt Readings from Last 3 Encounters:  07/26/24 181 lb (82.1 kg)  07/11/24 181 lb 0.7 oz (82.1 kg)  07/11/24 181 lb (82.1 kg)   BP Readings from Last 3 Encounters:  07/15/24 (!) 147/69  07/11/24 113/60  07/11/24 110/76   Pulse Readings from Last 3 Encounters:  07/15/24 76  07/11/24 61  07/11/24 67     Labs:?  Lab Results  Component Value Date   HGBA1C 6.3 (A) 07/11/2024   HGBA1C 6.7 (H) 12/19/2023   HGBA1C 7.1 (A) 09/12/2023   GLUCOSE 133 (H) 07/11/2024   MICRALBCREAT 215 (H) 12/19/2023   MICRALBCREAT 104 (H) 09/12/2023   MICRALBCREAT 205 (H) 05/31/2022   CREATININE 0.99 07/11/2024   CREATININE 1.16 (H) 05/22/2024   CREATININE 1.54 (H) 12/19/2023    Lab Results  Component Value Date   CHOL 163 12/19/2023   LDLCALC 79 12/19/2023   LDLCALC 87 09/12/2023   LDLCALC 81 09/15/2022   HDL 70 12/19/2023   TRIG 67 12/19/2023   TRIG 63 09/12/2023   TRIG 91 09/15/2022   ALT 23 07/11/2024   ALT 15 05/22/2024   AST 28 07/11/2024   AST  20 05/22/2024      Chemistry      Component Value Date/Time   NA 140 07/11/2024 1236   NA 138 11/26/2013 0848   K 4.4 07/11/2024 1236   K 3.3 (L) 11/26/2013 0848   CL 105 07/11/2024 1236   CL 98 11/26/2013 0848   CO2 24 07/11/2024 1236   CO2 31 11/26/2013 0848   BUN 16 07/11/2024 1236   BUN 23 (H) 11/26/2013 0848   CREATININE 0.99 07/11/2024 1236   CREATININE 1.54 (H) 12/19/2023 1111      Component Value Date/Time   CALCIUM  9.9 07/11/2024 1236   CALCIUM  9.5 11/26/2013 0848   ALKPHOS 95 07/11/2024  1236   ALKPHOS 114 11/26/2013 0848   AST 28 07/11/2024 1236   AST 15 11/26/2013 0848   ALT 23 07/11/2024 1236   ALT 26 11/26/2013 0848   BILITOT 0.3 07/11/2024 1236   BILITOT 0.4 11/26/2013 0848       The 10-year ASCVD risk score (Arnett DK, et al., 2019) is: 33.8%  Lab Results  Component Value Date   MICRALBCREAT 215 (H) 12/19/2023   MICRALBCREAT 104 (H) 09/12/2023   MICRALBCREAT 205 (H) 05/31/2022   MICRALBCREAT 47 (H) 05/09/2018   MICRALBCREAT 298 (H) 01/09/2018    A/P: Diabetes currently controlled with a most recent A1c of 6.3% on 07/11/25. Patient is able to verbalize appropriate hypoglycemia management plan. Medication adherence appears appropriate.  -Continued GLP-1 Trulicity  (dulaglutide ) 0.75 mg weekly -Continued SGLT2-I Farxiga  (dapagliflozin )10 mg daily. Will send updated dose to [facility] to reflect dose change from cardiology -Extensively discussed pathophysiology of diabetes, recommended lifestyle interventions, dietary effects on blood sugar control.  -Counseled on s/sx of and management of hypoglycemia.   ASCVD risk - secondary prevention in patient with diabetes. Last LDL is 79 mg/dL, not at goal of <29 mg/dL.  -Continued rosuvastatin  40 mg daily.   Patient verbalized understanding of treatment plan. Total time patient counseling 30 minutes.  Follow-up:  Pharmacist on 06/02/25  Peyton CHARLENA Ferries, PharmD, CPP Clinical Pharmacist [facility] [phone]   "

## 2024-09-03 NOTE — Telephone Encounter (Signed)
 Requested medication (s) are due for refill today: yes  Requested medication (s) are on the active medication list: yes  Last refill:  11/08/23  Future visit scheduled: yes  Notes to clinic:   Medication not assigned to a protocol, review manually.      Requested Prescriptions  Pending Prescriptions Disp Refills   Alcohol Swabs  (DROPSAFE ALCOHOL PREP) 70 % PADS [Pharmacy Med Name: DROPSAFE ALCOHOL PREP PADS 70 % Pad] 200 each 3    Sig: USE TWICE DAILY     Off-Protocol Failed - 09/03/2024 10:24 AM      Failed - Medication not assigned to a protocol, review manually.      Passed - Valid encounter within last 12 months    Recent Outpatient Visits           5 days ago Bronchitis   [facility] Gareth Clarity F, [name]   1 month ago Viral upper respiratory tract infection   [facility] Gareth Clarity FALCON, [name]   1 month ago Shortness of breath   [facility] Gareth Clarity FALCON, [name]   3 months ago Urinary frequency   [facility] Gareth Clarity FALCON, [name]   5 months ago Shortness of breath   [facility] Gareth Clarity FALCON, [name]              Signed Prescriptions Disp Refills   TRUEplus Lancets 33G MISC 200 each 0    Sig: TEST BLOOD SUGAR TWO TIMES DAILY     Endocrinology: Diabetes - Testing Supplies Passed - 09/03/2024 10:24 AM      Passed - Valid encounter within last 12 months    Recent Outpatient Visits           5 days ago Bronchitis   [facility] Gareth Clarity FALCON, [name]   1 month ago Viral upper respiratory tract infection   [facility] Gareth Clarity FALCON, [name]   1 month ago Shortness of breath   [facility] Gareth Clarity FALCON, [name]   3 months ago Urinary frequency   [facility] Gareth Clarity FALCON, [name]   5 months ago Shortness of breath   [facility] Gareth Clarity FALCON, [name]

## 2024-09-03 NOTE — Telephone Encounter (Signed)
 Requested Prescriptions  Pending Prescriptions Disp Refills   Alcohol Swabs  (DROPSAFE ALCOHOL PREP) 70 % PADS [Pharmacy Med Name: DROPSAFE ALCOHOL PREP PADS 70 % Pad] 200 each 3    Sig: USE TWICE DAILY     Off-Protocol Failed - 09/03/2024 10:21 AM      Failed - Medication not assigned to a protocol, review manually.      Passed - Valid encounter within last 12 months    Recent Outpatient Visits           5 days ago Bronchitis   [facility] Gareth Clarity F, [name]   1 month ago Viral upper respiratory tract infection   [facility] Gareth Clarity FALCON, [name]   1 month ago Shortness of breath   [facility] Gareth Clarity FALCON, [name]   3 months ago Urinary frequency   [facility] Gareth Clarity FALCON, [name]   5 months ago Shortness of breath   [facility] Gareth Clarity FALCON, [name]               TRUEplus Lancets 33G MISC [Pharmacy Med Name: TRUEPLUS LANCETS 33G] 200 each 0    Sig: TEST BLOOD SUGAR TWO TIMES DAILY     Endocrinology: Diabetes - Testing Supplies Passed - 09/03/2024 10:21 AM      Passed - Valid encounter within last 12 months    Recent Outpatient Visits           5 days ago Bronchitis   [facility] Gareth Clarity FALCON, [name]   1 month ago Viral upper respiratory tract infection   [facility] Gareth Clarity FALCON, [name]   1 month ago Shortness of breath   [facility] Gareth Clarity FALCON, [name]   3 months ago Urinary frequency   [facility] Gareth Clarity FALCON, [name]   5 months ago Shortness of breath   [facility] Gareth Clarity FALCON, [name]

## 2024-09-11 ENCOUNTER — Telehealth: Payer: Self-pay | Admitting: Nurse Practitioner

## 2024-09-11 NOTE — Telephone Encounter (Unsigned)
 Copied from CRM #[id number]. Topic: Clinical - Prescription Issue >> Sep 11, 2024  1:09 PM Willma R wrote: Reason for CRM: Suzette from [facility] is calling in regards to the order they received for the patients readers and sensors but the form only has sensors marked and should be both. Order is also for the [facility] but the patient wants the brand, [employer] - Libre 3 Plus.  Suzette will refax the order form to the office.

## 2024-09-13 MED ORDER — ALBUTEROL SULFATE HFA 108 (90 BASE) MCG/ACT IN AERS: 2.0000 | INHALATION_SPRAY | Freq: Four times a day (QID) | RESPIRATORY_TRACT | 0 refills | Status: AC | PRN

## 2024-09-13 NOTE — Telephone Encounter (Signed)
 Pt states she needs a 3 mth supply of her Albuterol  Inhaler sent to [facility] .

## 2024-09-27 NOTE — Progress Notes (Signed)
 Lori Potts                                          MRN: [id number]   09/27/2024   The [facility] Quality Team Specialist reviewed this patient medical record for the purposes of chart review for care gap closure. The following were reviewed: chart review for care gap closure-controlling blood pressure.    [facility] Quality Team

## 2024-10-14 ENCOUNTER — Inpatient Hospital Stay

## 2024-10-14 ENCOUNTER — Inpatient Hospital Stay: Admitting: Oncology

## 2025-04-10 ENCOUNTER — Ambulatory Visit

## 2025-06-02 ENCOUNTER — Other Ambulatory Visit
